# Patient Record
Sex: Male | Born: 1950
Health system: Southern US, Community
[De-identification: ages and names within clinical notes are randomized; demographics above are authoritative.]

## PROBLEM LIST (undated history)

## (undated) DIAGNOSIS — E039 Hypothyroidism, unspecified: Secondary | ICD-10-CM

## (undated) DIAGNOSIS — K469 Unspecified abdominal hernia without obstruction or gangrene: Secondary | ICD-10-CM

## (undated) DIAGNOSIS — M1990 Unspecified osteoarthritis, unspecified site: Secondary | ICD-10-CM

## (undated) DIAGNOSIS — N182 Chronic kidney disease, stage 2 (mild): Secondary | ICD-10-CM

## (undated) DIAGNOSIS — E785 Hyperlipidemia, unspecified: Secondary | ICD-10-CM

## (undated) DIAGNOSIS — D649 Anemia, unspecified: Secondary | ICD-10-CM

## (undated) DIAGNOSIS — F329 Major depressive disorder, single episode, unspecified: Secondary | ICD-10-CM

## (undated) DIAGNOSIS — K219 Gastro-esophageal reflux disease without esophagitis: Secondary | ICD-10-CM

## (undated) DIAGNOSIS — I10 Essential (primary) hypertension: Secondary | ICD-10-CM

## (undated) DIAGNOSIS — T7840XA Allergy, unspecified, initial encounter: Secondary | ICD-10-CM

## (undated) DIAGNOSIS — I714 Abdominal aortic aneurysm, without rupture, unspecified: Secondary | ICD-10-CM

## (undated) DIAGNOSIS — K5730 Diverticulosis of large intestine without perforation or abscess without bleeding: Secondary | ICD-10-CM

## (undated) DIAGNOSIS — F32A Depression, unspecified: Secondary | ICD-10-CM

## (undated) DIAGNOSIS — Z7901 Long term (current) use of anticoagulants: Secondary | ICD-10-CM

## (undated) DIAGNOSIS — I255 Ischemic cardiomyopathy: Secondary | ICD-10-CM

## (undated) DIAGNOSIS — I2510 Atherosclerotic heart disease of native coronary artery without angina pectoris: Secondary | ICD-10-CM

## (undated) DIAGNOSIS — I5022 Chronic systolic (congestive) heart failure: Secondary | ICD-10-CM

## (undated) DIAGNOSIS — I723 Aneurysm of iliac artery: Secondary | ICD-10-CM

## (undated) HISTORY — PX: CARDIAC CATHETERIZATION: SHX172

## (undated) HISTORY — DX: Chronic kidney disease, stage 2 (mild): N18.2

## (undated) HISTORY — DX: Essential (primary) hypertension: I10

## (undated) HISTORY — DX: Major depressive disorder, single episode, unspecified: F32.9

## (undated) HISTORY — DX: Depression, unspecified: F32.A

## (undated) HISTORY — DX: Unspecified abdominal hernia without obstruction or gangrene: K46.9

## (undated) HISTORY — DX: Allergy, unspecified, initial encounter: T78.40XA

## (undated) HISTORY — PX: JOINT REPLACEMENT: SHX530

## (undated) HISTORY — DX: Gastro-esophageal reflux disease without esophagitis: K21.9

## (undated) HISTORY — DX: Hyperlipidemia, unspecified: E78.5

## (undated) HISTORY — DX: Unspecified osteoarthritis, unspecified site: M19.90

## (undated) HISTORY — DX: Abdominal aortic aneurysm, without rupture, unspecified: I71.40

## (undated) HISTORY — DX: Atherosclerotic heart disease of native coronary artery without angina pectoris: I25.10

## (undated) HISTORY — DX: Anemia, unspecified: D64.9

## (undated) HISTORY — PX: HERNIA REPAIR: SHX51

## (undated) HISTORY — DX: Abdominal aortic aneurysm, without rupture: I71.4

---

## 1995-11-15 HISTORY — PX: EXPLORATORY LAPAROTOMY: SUR591

## 2006-11-14 HISTORY — PX: CORONARY ARTERY BYPASS GRAFT: SHX141

## 2007-06-18 ENCOUNTER — Ambulatory Visit: Payer: Self-pay | Admitting: Cardiovascular Disease

## 2007-06-18 HISTORY — PX: CARDIAC CATHETERIZATION: SHX172

## 2007-06-25 DIAGNOSIS — Z951 Presence of aortocoronary bypass graft: Secondary | ICD-10-CM

## 2007-06-25 HISTORY — DX: Presence of aortocoronary bypass graft: Z95.1

## 2007-06-25 HISTORY — PX: CORONARY ARTERY BYPASS GRAFT: SHX141

## 2007-08-06 ENCOUNTER — Ambulatory Visit: Payer: Self-pay | Admitting: Vascular Surgery

## 2007-08-15 HISTORY — PX: ABDOMINAL AORTIC ANEURYSM REPAIR: SUR1152

## 2007-08-23 ENCOUNTER — Ambulatory Visit: Payer: Self-pay | Admitting: Vascular Surgery

## 2007-08-29 ENCOUNTER — Inpatient Hospital Stay: Payer: Self-pay | Admitting: Vascular Surgery

## 2007-08-29 HISTORY — PX: ABDOMINAL AORTIC ANEURYSM REPAIR: SUR1152

## 2010-06-28 ENCOUNTER — Ambulatory Visit: Payer: Self-pay | Admitting: Internal Medicine

## 2010-06-28 HISTORY — PX: CARDIAC CATHETERIZATION: SHX172

## 2010-07-12 ENCOUNTER — Ambulatory Visit: Payer: Self-pay

## 2012-02-20 ENCOUNTER — Encounter: Payer: Self-pay | Admitting: Cardiovascular Disease

## 2012-02-20 ENCOUNTER — Ambulatory Visit (INDEPENDENT_AMBULATORY_CARE_PROVIDER_SITE_OTHER): Payer: No Typology Code available for payment source | Admitting: Cardiovascular Disease

## 2012-02-20 VITALS — BP 116/84 | Ht 73.0 in | Wt 221.0 lb

## 2012-02-20 DIAGNOSIS — E785 Hyperlipidemia, unspecified: Secondary | ICD-10-CM

## 2012-02-20 DIAGNOSIS — R0789 Other chest pain: Secondary | ICD-10-CM

## 2012-02-20 DIAGNOSIS — I714 Abdominal aortic aneurysm, without rupture, unspecified: Secondary | ICD-10-CM

## 2012-02-20 DIAGNOSIS — N183 Chronic kidney disease, stage 3 unspecified: Secondary | ICD-10-CM | POA: Insufficient documentation

## 2012-02-20 DIAGNOSIS — I1310 Hypertensive heart and chronic kidney disease without heart failure, with stage 1 through stage 4 chronic kidney disease, or unspecified chronic kidney disease: Secondary | ICD-10-CM | POA: Insufficient documentation

## 2012-02-20 DIAGNOSIS — I129 Hypertensive chronic kidney disease with stage 1 through stage 4 chronic kidney disease, or unspecified chronic kidney disease: Secondary | ICD-10-CM | POA: Insufficient documentation

## 2012-02-20 DIAGNOSIS — I10 Essential (primary) hypertension: Secondary | ICD-10-CM

## 2012-02-20 DIAGNOSIS — I2510 Atherosclerotic heart disease of native coronary artery without angina pectoris: Secondary | ICD-10-CM | POA: Insufficient documentation

## 2012-02-20 MED ORDER — ROSUVASTATIN CALCIUM 5 MG PO TABS: 5.0000 mg | ORAL_TABLET | Freq: Every day | ORAL | Status: DC

## 2012-02-20 NOTE — Patient Instructions (Addendum)
Change Crestor to 5 mg take one tablet daily. Need to have a Lexiscan Myoview at [facility] for March 09, 2012 arrive at 7:00 am.  Follow up in 6 months with Dr.Arida.

## 2012-02-20 NOTE — Assessment & Plan Note (Signed)
I recommend changing Crestor to 5 mg once daily instead of 10 mg once a week in order to provide a more consistent dose. He is going to have a physical done with Dr. Dossie Arbour in the next 2 months. I recommend a followup fasting lipid and liver profile.

## 2012-02-20 NOTE — Progress Notes (Signed)
HPI  Stuart Harvey is a 61 year old male who is here today to reestablish cardiovascular care. I saw him in 2008 for chest pain. Cardiac catheterization showed severe three-vessel coronary artery disease. It also showed a large infrarenal abdominal aortic aneurysm. He underwent coronary artery bypass graft surgery in August 2008 at [facility]. He underwent open surgical repair of aortic aneurysm in October of 2008. No cardiac events since then. He saw Dr. Gwen Pounds for the past 2 years. He had an abnormal stress test in 2011. Cardiac catheterization showed patent grafts with an ejection fraction of 40%. The patient has been doing reasonably well. However, he had few episodes of left-sided chest discomfort described as an aching sensation lasting for about 5 minutes. These happened at rest. He also did notice increased exertional dyspnea. He thinks that the chest pain might be muscular. He takes Crestor 10 mg once a week due to previous myalgia.  Not on File   Current Outpatient Prescriptions on File Prior to Visit  Medication Sig Dispense Refill  . albuterol-ipratropium (COMBIVENT) 18-103 MCG/ACT inhaler Inhale 2 puffs into the lungs every 6 (six) hours as needed.      Marland Kitchen buPROPion (WELLBUTRIN SR) 200 MG 12 hr tablet Take 200 mg by mouth 2 (two) times daily.      . citalopram (CELEXA) 20 MG tablet Take 20 mg by mouth daily.      Marland Kitchen lisinopril (PRINIVIL,ZESTRIL) 5 MG tablet Take 5 mg by mouth daily.      . metoprolol (LOPRESSOR) 50 MG tablet Take 50 mg by mouth daily.      Marland Kitchen omeprazole (PRILOSEC) 20 MG capsule Take 20 mg by mouth daily.         Past Medical History  Diagnosis Date  . Arthritis     both knees  . History of heart attack     "silent heart attack"  . Coronary artery disease     CABG at [facility] in 06/2007 for 3 vessel CAD, Cath in 2011 showed patent grafts. EF 40%  . Hypertension   . Hyperlipidemia   . AAA (abdominal aortic aneurysm)     s/p surgical repair in  08/2007 by Dr. Earnestine Leys     Past Surgical History  Procedure Date  . Exploratory laparotomy 1997  . Coronary artery bypass graft 2008  . Cardiac catheterization 2008, 2011    [facility]   . Abdominal aortic aneurysm repair 08/2007     Family History  Problem Relation Age of Onset  . Heart disease    . Heart attack       History   Social History  . Marital Status: Unknown    Spouse Name: N/A    Number of Children: N/A  . Years of Education: N/A   Occupational History  . Not on file.   Social History Main Topics  . Smoking status: Former Games developer  . Smokeless tobacco: Not on file   Comment: quit 2008  . Alcohol Use: No  . Drug Use: No  . Sexually Active: Not on file   Other Topics Concern  . Not on file   Social History Narrative  . No narrative on file     ROS Constitutional: Negative for fever, chills, diaphoresis, activity change, appetite change and fatigue.  HENT: Negative for hearing loss, nosebleeds, congestion, sore throat, facial swelling, drooling, trouble swallowing, neck pain, voice change, sinus pressure and tinnitus.  Eyes: Negative for photophobia, pain, discharge and visual disturbance.  Respiratory: Negative  for apnea, cough and wheezing.  Cardiovascular: Negative for  palpitations and leg swelling.  Gastrointestinal: Negative for nausea, vomiting, abdominal pain, diarrhea, constipation, blood in stool and abdominal distention.  Genitourinary: Negative for dysuria, urgency, frequency, hematuria and decreased urine volume.  Musculoskeletal: Negative for myalgias, back pain, joint swelling, arthralgias and gait problem.  Skin: Negative for color change, pallor, rash and wound.  Neurological: Negative for dizziness, tremors, seizures, syncope, speech difficulty, weakness, light-headedness, numbness and headaches.  Psychiatric/Behavioral: Negative for suicidal ideas, hallucinations, behavioral problems and agitation. The patient is not nervous/anxious.      PHYSICAL EXAM   BP 116/84  Ht 6\' 1"  (1.854 m)  Wt 221 lb (100.245 kg)  BMI 29.16 kg/m2  Constitutional: He is oriented to person, place, and time. He appears well-developed and well-nourished. No distress.  HENT: No nasal discharge.  Head: Normocephalic and atraumatic.  Eyes: Pupils are equal and round. Right eye exhibits no discharge. Left eye exhibits no discharge.  Neck: Normal range of motion. Neck supple. No JVD present. No thyromegaly present.  Cardiovascular: Normal rate, regular rhythm, normal heart sounds and. Exam reveals no gallop and no friction rub. No murmur heard.  Pulmonary/Chest: Surgical scar from CABG.  Effort normal and breath sounds normal. No stridor. No respiratory distress. He has no wheezes. He has no rales. He exhibits no tenderness.  Abdominal: Soft. Bowel sounds are normal. He exhibits no distension. There is no tenderness. There is no rebound and no guarding. There is a surgical scar from previous aneurysm repair.  Musculoskeletal: Normal range of motion. He exhibits no edema and no tenderness.  Neurological: He is alert and oriented to person, place, and time. Coordination normal.  Skin: Skin is warm and dry. No rash noted. He is not diaphoretic. No erythema. No pallor.  Psychiatric: He has a normal mood and affect. His behavior is normal. Judgment and thought content normal.       EKG: Sinus bradycardia with left anterior fascicular block. There is T-wave inversion in the precordial leads.   ASSESSMENT AND PLAN

## 2012-02-20 NOTE — Assessment & Plan Note (Signed)
His blood pressure is well controlled. Continue current medications. We'll have to clarify whether he is on Toprol or Lopressor.  I

## 2012-02-20 NOTE — Assessment & Plan Note (Signed)
The patient has known history of coronary artery disease status post CABG in 2008. Cardiac catheterization 2011 showed patent grafts. Currently he is having exertional dyspnea and an atypical chest pain. I recommend further evaluation with a pharmacologic nuclear stress test for further evaluation.  Continue medical therapy for CAD.

## 2012-02-20 NOTE — Assessment & Plan Note (Signed)
Would consider an abdominal ultrasound in the future if this is not being followed somewhere else.

## 2012-03-09 ENCOUNTER — Ambulatory Visit: Payer: Self-pay | Admitting: Cardiovascular Disease

## 2012-03-09 DIAGNOSIS — R079 Chest pain, unspecified: Secondary | ICD-10-CM

## 2012-03-12 ENCOUNTER — Other Ambulatory Visit: Payer: Self-pay | Admitting: Cardiovascular Disease

## 2012-03-12 DIAGNOSIS — R0789 Other chest pain: Secondary | ICD-10-CM

## 2012-03-14 ENCOUNTER — Encounter: Payer: Self-pay | Admitting: Cardiovascular Disease

## 2012-08-28 ENCOUNTER — Encounter: Payer: Self-pay | Admitting: Cardiovascular Disease

## 2012-08-28 ENCOUNTER — Ambulatory Visit (INDEPENDENT_AMBULATORY_CARE_PROVIDER_SITE_OTHER): Payer: No Typology Code available for payment source | Admitting: Cardiovascular Disease

## 2012-08-28 VITALS — BP 100/70 | HR 57 | Ht 73.0 in | Wt 210.5 lb

## 2012-08-28 DIAGNOSIS — I2510 Atherosclerotic heart disease of native coronary artery without angina pectoris: Secondary | ICD-10-CM

## 2012-08-28 DIAGNOSIS — F32A Depression, unspecified: Secondary | ICD-10-CM | POA: Insufficient documentation

## 2012-08-28 DIAGNOSIS — E785 Hyperlipidemia, unspecified: Secondary | ICD-10-CM

## 2012-08-28 DIAGNOSIS — F329 Major depressive disorder, single episode, unspecified: Secondary | ICD-10-CM | POA: Insufficient documentation

## 2012-08-28 DIAGNOSIS — I714 Abdominal aortic aneurysm, without rupture, unspecified: Secondary | ICD-10-CM

## 2012-08-28 DIAGNOSIS — I10 Essential (primary) hypertension: Secondary | ICD-10-CM

## 2012-08-28 DIAGNOSIS — F3289 Other specified depressive episodes: Secondary | ICD-10-CM

## 2012-08-28 MED ORDER — METOPROLOL TARTRATE 25 MG PO TABS: 25.0000 mg | ORAL_TABLET | Freq: Two times a day (BID) | ORAL | Status: DC

## 2012-08-28 NOTE — Assessment & Plan Note (Signed)
Overall, he seems to be doing reasonably well. He hasn't had any significant episodes of chest pain since his last visit. Most recent stress test was not significantly changed from before. Continue medical therapy.

## 2012-08-28 NOTE — Progress Notes (Signed)
HPI  [name] is a 61 year old male who is here today for a followup visit. He has known history of severe three-vessel coronary artery disease diagnosed in 2008 as well as a large infrarenal abdominal aortic aneurysm. He underwent coronary artery bypass graft surgery in August 2008 at [facility]. He underwent open surgical repair of aortic aneurysm in October of 2008. No cardiac events since then.  He had an abnormal stress test in 2011. Cardiac catheterization was done by Dr. Gwen Pounds which showed patent grafts with an ejection fraction of 40%. During his last visit, he had some atypical chest pain. He underwent a nuclear stress test which showed evidence of prior inferolateral infarct with some peri-infarct ischemia and normal ejection fraction. This was compared to his previous stress testing in 2011 and basically was unchanged. Since last visit, the patient has been doing reasonably well. However, he is having more problems with depression as well as an intentional weight loss of 11 pounds. He denies any chest pain or significant dyspnea.  No Known Allergies   Current Outpatient Prescriptions on File Prior to Visit  Medication Sig Dispense Refill  . aspirin 81 MG tablet Take 81 mg by mouth daily.      Marland Kitchen buPROPion (WELLBUTRIN SR) 200 MG 12 hr tablet Take 200 mg by mouth 2 (two) times daily.      . Cholecalciferol (D3 SUPER STRENGTH) 2000 UNITS CAPS Take by mouth daily.      . citalopram (CELEXA) 20 MG tablet Take 20 mg by mouth daily.      . Coenzyme Q10 (COQ-10) 200 MG CAPS Take by mouth daily.      Marland Kitchen levothyroxine (SYNTHROID, LEVOTHROID) 50 MCG tablet Take 50 mcg by mouth daily.      Marland Kitchen lisinopril (PRINIVIL,ZESTRIL) 5 MG tablet Take 5 mg by mouth daily.      Marland Kitchen omeprazole (PRILOSEC) 20 MG capsule Take 20 mg by mouth daily.         Past Medical History  Diagnosis Date  . Arthritis     both knees  . History of heart attack     "silent heart attack"  .  Hypertension   . Hyperlipidemia   . AAA (abdominal aortic aneurysm)     s/p surgical repair in 08/2007 by Dr. Earnestine Leys  . Coronary artery disease     CABG at [facility] in 06/2007 for 3 vessel CAD, LIMA to LAD, SVG to RPDA, SVG to OM2 and SVG to D2.  Cath in 2011 showed patent grafts. EF 40%     Past Surgical History  Procedure Date  . Exploratory laparotomy 1997  . Coronary artery bypass graft 2008  . Cardiac catheterization 2008, 2011    [facility]   . Abdominal aortic aneurysm repair 08/2007     Family History  Problem Relation Age of Onset  . Heart disease    . Heart attack       History   Social History  . Marital Status: Unknown    Spouse Name: N/A    Number of Children: N/A  . Years of Education: N/A   Occupational History  . Not on file.   Social History Main Topics  . Smoking status: Former Smoker -- 1.0 packs/day for 35 years    Types: Cigarettes    Quit date: 12/16/2005  . Smokeless tobacco: Not on file   Comment: quit 2008  . Alcohol Use: No  . Drug Use: No  . Sexually Active:  Not on file   Other Topics Concern  . Not on file   Social History Narrative  . No narrative on file        PHYSICAL EXAM   BP 100/70  Pulse 57  Ht 6\' 1"  (1.854 m)  Wt 210 lb 8 oz (95.482 kg)  BMI 27.77 kg/m2  Constitutional: He is oriented to person, place, and time. He appears well-developed and well-nourished. No distress.  HENT: No nasal discharge.  Head: Normocephalic and atraumatic.  Eyes: Pupils are equal and round. Right eye exhibits no discharge. Left eye exhibits no discharge.  Neck: Normal range of motion. Neck supple. No JVD present. No thyromegaly present.  Cardiovascular: Normal rate, regular rhythm, normal heart sounds and. Exam reveals no gallop and no friction rub. No murmur heard.  Pulmonary/Chest: Surgical scar from CABG.  Effort normal and breath sounds normal. No stridor. No respiratory distress. He has no wheezes. He has no rales. He exhibits no  tenderness.  Abdominal: Soft. Bowel sounds are normal. He exhibits no distension. There is no tenderness. There is no rebound and no guarding. There is a surgical scar from previous aneurysm repair.  Musculoskeletal: Normal range of motion. He exhibits no edema and no tenderness.  Neurological: He is alert and oriented to person, place, and time. Coordination normal.  Skin: Skin is warm and dry. No rash noted. He is not diaphoretic. No erythema. No pallor.  Psychiatric: He has a normal mood and affect. His behavior is normal. Judgment and thought content normal.       EKG: Sinus  Bradycardia  -Nonspecific QRS widening and anterior fascicular block.   -  Negative T-waves  -Possible  Anterolateral  ischemia.   ABNORMAL   ASSESSMENT AND PLAN

## 2012-08-28 NOTE — Assessment & Plan Note (Signed)
He is currently taking metoprolol tartrate 50 mg once daily. This should be a twice a day medication and thus I will go ahead and change it to 25 mg twice daily.

## 2012-08-28 NOTE — Patient Instructions (Addendum)
Change Metoprolol to 25 mg twice daily (you can take 1/2 tablet of 50 mg twice daily until you get new prescription) Continue other medications.  Follow up in 6 months.

## 2012-08-28 NOTE — Assessment & Plan Note (Signed)
He is tolerating current dose of Crestor 10 mg once daily.

## 2012-08-28 NOTE — Assessment & Plan Note (Signed)
The patient seems to be having increased symptoms of depression with weight loss. He has a followup appointment with Dr. Dossie Arbour about that.

## 2012-08-28 NOTE — Assessment & Plan Note (Signed)
Status post surgical repair in 2008. This is being followed by Dr. Wyn Quaker.

## 2012-11-02 ENCOUNTER — Ambulatory Visit: Payer: Self-pay | Admitting: Vascular Surgery

## 2012-11-14 HISTORY — PX: ABDOMINAL AORTIC ANEURYSM REPAIR: SUR1152

## 2012-11-15 ENCOUNTER — Telehealth: Payer: Self-pay | Admitting: Cardiovascular Disease

## 2012-11-15 NOTE — Telephone Encounter (Signed)
He is at low - moderate risk. Most recent stress test was done in April of 2013.

## 2012-11-15 NOTE — Telephone Encounter (Signed)
See below and advise thanks 

## 2012-11-15 NOTE — Telephone Encounter (Signed)
Pt needs AAA surgery and needs cardiac clearance. Please fax to [phone]. Dr Wyn Quaker will preform procedure

## 2012-11-15 NOTE — Telephone Encounter (Signed)
Letter faxed to # provided 

## 2012-11-26 ENCOUNTER — Ambulatory Visit: Payer: Self-pay | Admitting: Vascular Surgery

## 2012-11-26 LAB — CBC
HCT: 41.7 % (ref 40.0–52.0)
HGB: 14.1 g/dL (ref 13.0–18.0)
MCV: 94 fL (ref 80–100)
Platelet: 207 10*3/uL (ref 150–440)
RBC: 4.46 10*6/uL (ref 4.40–5.90)
RDW: 13.5 % (ref 11.5–14.5)

## 2012-11-26 LAB — BASIC METABOLIC PANEL
Anion Gap: 4 — ABNORMAL LOW (ref 7–16)
BUN: 30 mg/dL — ABNORMAL HIGH (ref 7–18)
Calcium, Total: 9.3 mg/dL (ref 8.5–10.1)
Chloride: 105 mmol/L (ref 98–107)
Co2: 27 mmol/L (ref 21–32)
Creatinine: 1.12 mg/dL (ref 0.60–1.30)
EGFR (Non-African Amer.): >60
Glucose: 94 mg/dL (ref 65–99)
Sodium: 136 mmol/L (ref 136–145)

## 2012-12-05 ENCOUNTER — Inpatient Hospital Stay: Payer: Self-pay | Admitting: Vascular Surgery

## 2012-12-05 HISTORY — PX: ABDOMINAL AORTIC ANEURYSM REPAIR: SUR1152

## 2012-12-06 LAB — COMPREHENSIVE METABOLIC PANEL
Albumin: 3 g/dL — ABNORMAL LOW (ref 3.4–5.0)
BUN: 19 mg/dL — ABNORMAL HIGH (ref 7–18)
Bilirubin,Total: 0.3 mg/dL (ref 0.2–1.0)
Calcium, Total: 8.4 mg/dL — ABNORMAL LOW (ref 8.5–10.1)
Chloride: 107 mmol/L (ref 98–107)
Co2: 27 mmol/L (ref 21–32)
Creatinine: 1.16 mg/dL (ref 0.60–1.30)
Osmolality: 280 (ref 275–301)
SGOT(AST): 25 U/L (ref 15–37)
Sodium: 139 mmol/L (ref 136–145)
Total Protein: 6.2 g/dL — ABNORMAL LOW (ref 6.4–8.2)

## 2012-12-06 LAB — PROTIME-INR: INR: 1

## 2012-12-06 LAB — CBC WITH DIFFERENTIAL/PLATELET
Basophil %: 0.2 %
Eosinophil %: 0.9 %
HCT: 36.9 % — ABNORMAL LOW (ref 40.0–52.0)
HGB: 12.4 g/dL — ABNORMAL LOW (ref 13.0–18.0)
Lymphocyte #: 2 10*3/uL (ref 1.0–3.6)
Lymphocyte %: 19.4 %
MCH: 31.2 pg (ref 26.0–34.0)
MCV: 93 fL (ref 80–100)
Monocyte #: 1 x10 3/mm (ref 0.2–1.0)
Neutrophil #: 7.4 10*3/uL — ABNORMAL HIGH (ref 1.4–6.5)
RBC: 3.98 10*6/uL — ABNORMAL LOW (ref 4.40–5.90)
RDW: 13.1 % (ref 11.5–14.5)

## 2012-12-06 LAB — PHOSPHORUS: Phosphorus: 3.3 mg/dL (ref 2.5–4.9)

## 2013-01-21 ENCOUNTER — Telehealth: Payer: Self-pay | Admitting: *Deleted

## 2013-01-21 NOTE — Telephone Encounter (Signed)
Error

## 2013-02-26 ENCOUNTER — Encounter: Payer: Self-pay | Admitting: Cardiovascular Disease

## 2013-02-26 ENCOUNTER — Ambulatory Visit (INDEPENDENT_AMBULATORY_CARE_PROVIDER_SITE_OTHER): Payer: No Typology Code available for payment source | Admitting: Cardiovascular Disease

## 2013-02-26 VITALS — BP 120/70 | HR 55 | Ht 72.0 in | Wt 218.5 lb

## 2013-02-26 DIAGNOSIS — R079 Chest pain, unspecified: Secondary | ICD-10-CM

## 2013-02-26 DIAGNOSIS — I10 Essential (primary) hypertension: Secondary | ICD-10-CM

## 2013-02-26 DIAGNOSIS — R0602 Shortness of breath: Secondary | ICD-10-CM

## 2013-02-26 DIAGNOSIS — I2510 Atherosclerotic heart disease of native coronary artery without angina pectoris: Secondary | ICD-10-CM

## 2013-02-26 DIAGNOSIS — E785 Hyperlipidemia, unspecified: Secondary | ICD-10-CM

## 2013-02-26 NOTE — Patient Instructions (Addendum)
Continue same medications  Follow up in 1 year

## 2013-02-26 NOTE — Progress Notes (Signed)
HPI  [name] is a 62 year old male who is here today for a followup visit. He has known history of severe three-vessel coronary artery disease diagnosed in 2008 as well as a large infrarenal abdominal aortic aneurysm. He underwent coronary artery bypass graft surgery in August 2008 at [facility]. He underwent open surgical repair of aortic aneurysm in October of 2008. No cardiac events since then.  He had an abnormal stress test in 2011. Cardiac catheterization showed patent grafts with an ejection fraction of 40%. Most recent stress test was in 2013 which showed evidence of prior inferolateral infarct with some peri-infarct ischemia and normal ejection fraction. This was compared to his previous stress testing in 2011 and basically was unchanged. In January of this year, he underwent endovascular repair of AAA endoleak by Dr. Wyn Quaker.  He has been stable from a cardiac standpoint. He gets occasional substernal chest discomfort if he overexerts himself with dyspnea. These symptoms have been unchanged over the last year. He is able to do his yard work without significant limitations.  No Known Allergies   Current Outpatient Prescriptions on File Prior to Visit  Medication Sig Dispense Refill  . aspirin 81 MG tablet Take 81 mg by mouth daily.      Marland Kitchen buPROPion (WELLBUTRIN SR) 200 MG 12 hr tablet Take 200 mg by mouth 2 (two) times daily.      . Cholecalciferol (D3 SUPER STRENGTH) 2000 UNITS CAPS Take by mouth daily.      . citalopram (CELEXA) 20 MG tablet Take 20 mg by mouth daily.      . Coenzyme Q10 (COQ-10) 200 MG CAPS Take by mouth daily.      Marland Kitchen levothyroxine (SYNTHROID, LEVOTHROID) 50 MCG tablet Take 50 mcg by mouth daily.      Marland Kitchen lisinopril (PRINIVIL,ZESTRIL) 5 MG tablet Take 5 mg by mouth daily.      . metoprolol tartrate (LOPRESSOR) 25 MG tablet Take 1 tablet (25 mg total) by mouth 2 (two) times daily.  180 tablet  3  . omeprazole (PRILOSEC) 20 MG capsule Take 20 mg by  mouth daily.      . rosuvastatin (CRESTOR) 10 MG tablet Take 10 mg by mouth daily.       No current facility-administered medications on file prior to visit.     Past Medical History  Diagnosis Date  . Arthritis     both knees  . History of heart attack     "silent heart attack"  . Hypertension   . Hyperlipidemia   . AAA (abdominal aortic aneurysm)     s/p surgical repair in 08/2007 by Dr. Earnestine Leys  . Coronary artery disease     CABG at [facility] in 06/2007 for 3 vessel CAD, LIMA to LAD, SVG to RPDA, SVG to OM2 and SVG to D2.  Cath in 2011 showed patent grafts. EF 40%     Past Surgical History  Procedure Laterality Date  . Exploratory laparotomy  1997  . Coronary artery bypass graft  2008  . Cardiac catheterization  2008, 2011    [facility]   . Abdominal aortic aneurysm repair  08/2007  . Abdominal aortic aneurysm repair  11/2012    [facility]     Family History  Problem Relation Age of Onset  . Heart disease    . Heart attack       History   Social History  . Marital Status: Unknown    Spouse Name: N/A  Number of Children: N/A  . Years of Education: N/A   Occupational History  . Not on file.   Social History Main Topics  . Smoking status: Former Smoker -- 1.00 packs/day for 35 years    Types: Cigarettes    Quit date: 12/16/2005  . Smokeless tobacco: Not on file     Comment: quit 2008  . Alcohol Use: No  . Drug Use: No  . Sexually Active: Not on file   Other Topics Concern  . Not on file   Social History Narrative  . No narrative on file        PHYSICAL EXAM   BP 120/70  Pulse 55  Ht 6' (1.829 m)  Wt 218 lb 8 oz (99.111 kg)  BMI 29.63 kg/m2  Constitutional: He is oriented to person, place, and time. He appears well-developed and well-nourished. No distress.  HENT: No nasal discharge.  Head: Normocephalic and atraumatic.  Eyes: Pupils are equal and round. Right eye exhibits no discharge. Left eye exhibits no discharge.  Neck: Normal range of motion.  Neck supple. No JVD present. No thyromegaly present.  Cardiovascular: Normal rate, regular rhythm, normal heart sounds and. Exam reveals no gallop and no friction rub. No murmur heard.  Pulmonary/Chest: Surgical scar from CABG.  Effort normal and breath sounds normal. No stridor. No respiratory distress. He has no wheezes. He has no rales. He exhibits no tenderness.  Abdominal: Soft. Bowel sounds are normal. He exhibits no distension. There is no tenderness. There is no rebound and no guarding. There is a surgical scar from previous aneurysm repair.  Musculoskeletal: Normal range of motion. He exhibits no edema and no tenderness.  Neurological: He is alert and oriented to person, place, and time. Coordination normal.  Skin: Skin is warm and dry. No rash noted. He is not diaphoretic. No erythema. No pallor.  Psychiatric: He has a normal mood and affect. His behavior is normal. Judgment and thought content normal.       EKG: Sinus  Bradycardia  -Nonspecific QRS widening.   -  Nonspecific T-abnormality.   ABNORMAL   ASSESSMENT AND PLAN

## 2013-02-26 NOTE — Assessment & Plan Note (Signed)
Continue lifelong treatment with a statin.

## 2013-02-26 NOTE — Assessment & Plan Note (Signed)
He is stable from a cardiac standpoint with no change in cardiac symptoms. Continue medical therapy. He is mildly bradycardic but overall seems to be asymptomatic from this. Continue current dose of metoprolol.

## 2013-02-26 NOTE — Assessment & Plan Note (Signed)
His blood pressure is well controlled on current medications. 

## 2013-05-31 ENCOUNTER — Telehealth: Payer: Self-pay

## 2013-05-31 NOTE — Telephone Encounter (Signed)
He is at low risk for colonoscopy. Aspirin can be stopped 5 days prior to procedure.

## 2013-05-31 NOTE — Telephone Encounter (Signed)
Needs a cardiac clearance for colonoscopy with Dr. Marva Panda. Also needs to come off of aspirin 5 days prior. Please advise.

## 2013-05-31 NOTE — Telephone Encounter (Signed)
Cardiac clearance letter faxed to Dr. Marva Panda.

## 2013-07-02 ENCOUNTER — Ambulatory Visit: Payer: Self-pay | Admitting: Gastroenterology

## 2013-07-04 LAB — PATHOLOGY REPORT

## 2013-09-09 ENCOUNTER — Other Ambulatory Visit: Payer: Self-pay | Admitting: *Deleted

## 2013-09-09 MED ORDER — METOPROLOL TARTRATE 25 MG PO TABS: 25.0000 mg | ORAL_TABLET | Freq: Two times a day (BID) | ORAL | Status: DC

## 2013-09-09 NOTE — Telephone Encounter (Signed)
Requested Prescriptions   Signed Prescriptions Disp Refills  . metoprolol tartrate (LOPRESSOR) 25 MG tablet 180 tablet 3    Sig: Take 1 tablet (25 mg total) by mouth 2 (two) times daily.    Authorizing Provider: ARIDA, MUHAMMAD A    Ordering User: LOPEZ, MARINA C    

## 2014-02-14 ENCOUNTER — Encounter: Payer: Self-pay | Admitting: *Deleted

## 2014-02-28 ENCOUNTER — Ambulatory Visit (INDEPENDENT_AMBULATORY_CARE_PROVIDER_SITE_OTHER): Payer: No Typology Code available for payment source | Admitting: Cardiovascular Disease

## 2014-02-28 ENCOUNTER — Encounter: Payer: Self-pay | Admitting: Cardiovascular Disease

## 2014-02-28 VITALS — BP 120/76 | HR 51 | Ht 73.0 in | Wt 230.2 lb

## 2014-02-28 DIAGNOSIS — I2510 Atherosclerotic heart disease of native coronary artery without angina pectoris: Secondary | ICD-10-CM

## 2014-02-28 DIAGNOSIS — I714 Abdominal aortic aneurysm, without rupture, unspecified: Secondary | ICD-10-CM

## 2014-02-28 DIAGNOSIS — E785 Hyperlipidemia, unspecified: Secondary | ICD-10-CM

## 2014-02-28 DIAGNOSIS — R0602 Shortness of breath: Secondary | ICD-10-CM

## 2014-02-28 DIAGNOSIS — I10 Essential (primary) hypertension: Secondary | ICD-10-CM

## 2014-02-28 NOTE — Assessment & Plan Note (Signed)
He is doing very well with no symptoms suggestive of angina. Continue medical therapy. 

## 2014-02-28 NOTE — Assessment & Plan Note (Signed)
This is being followed by Dr. Lucky Cowboy

## 2014-02-28 NOTE — Assessment & Plan Note (Signed)
Most recent lipid profile in the summer of 2014 showed a total cholesterol of 162, HDL 43, triglycerides 96 and an LDL of 100. Ideally, we should target an LDL of less than 70. However, he experiences myalgia with statins. Thus, Continue current dose of Crestor for now.

## 2014-02-28 NOTE — Progress Notes (Signed)
Primary care physician: Kathrine Haddock at Dr. Rance Muir office  HPI  Mr. Wiswell is a 63 year old male who is here today for a followup visit. He has known history of severe three-vessel coronary artery disease diagnosed in 2008 as well as a large infrarenal abdominal aortic aneurysm. He underwent coronary artery bypass graft surgery in August 2008 at [facility]. He underwent open surgical repair of aortic aneurysm in October of 2008. No cardiac events since then.  He had an abnormal stress test in 2011. Cardiac catheterization showed patent grafts with an ejection fraction of 40%. Most recent stress test was in 2013 which showed evidence of prior inferolateral infarct with some peri-infarct ischemia and normal ejection fraction. This was compared to his previous stress testing in 2011 and basically was unchanged. Aortic aneurysm followup is done  by Dr. Lucky Cowboy.  He has been stable from a cardiac standpoint. He gets occasional substernal chest discomfort if he overexerts himself with dyspnea. These symptoms have been unchanged over the last year. He is able to do his yard work without significant limitations. He has history of myalgia with statins and did not tolerate atorvastatin in the past. He has been able to tolerate 10 mg of Crestor and only reports mild myalgia.  No Known Allergies   Current Outpatient Prescriptions on File Prior to Visit  Medication Sig Dispense Refill  . aspirin 81 MG tablet Take 81 mg by mouth daily.      Marland Kitchen buPROPion (WELLBUTRIN SR) 200 MG 12 hr tablet Take 200 mg by mouth 2 (two) times daily.      . Cholecalciferol (D3 SUPER STRENGTH) 2000 UNITS CAPS Take by mouth daily.      . citalopram (CELEXA) 20 MG tablet Take 20 mg by mouth daily.      . Coenzyme Q10 (COQ-10) 200 MG CAPS Take by mouth daily.      Marland Kitchen levothyroxine (SYNTHROID, LEVOTHROID) 50 MCG tablet Take 50 mcg by mouth daily.      Marland Kitchen lisinopril (PRINIVIL,ZESTRIL) 5 MG tablet Take 5 mg by mouth  daily.      . metoprolol tartrate (LOPRESSOR) 25 MG tablet Take 1 tablet (25 mg total) by mouth 2 (two) times daily.  180 tablet  3  . omeprazole (PRILOSEC) 20 MG capsule Take 20 mg by mouth daily.      . rosuvastatin (CRESTOR) 10 MG tablet Take 10 mg by mouth daily.       No current facility-administered medications on file prior to visit.     Past Medical History  Diagnosis Date  . Arthritis     both knees  . History of heart attack     "silent heart attack"  . Hypertension   . Hyperlipidemia   . AAA (abdominal aortic aneurysm)     s/p surgical repair in 08/2007 by Dr. Hulda Humphrey  . Coronary artery disease     CABG at [facility] in 06/2007 for 3 vessel CAD, LIMA to LAD, SVG to RPDA, SVG to OM2 and SVG to D2.  Cath in 2011 showed patent grafts. EF 40%  . Depression      Past Surgical History  Procedure Laterality Date  . Exploratory laparotomy  1997  . Coronary artery bypass graft  2008  . Cardiac catheterization  2008, 2011    [facility]   . Abdominal aortic aneurysm repair  08/2007  . Abdominal aortic aneurysm repair  11/2012    [facility]     Family History  Problem Relation Age of  Onset  . Heart disease    . Heart attack       History   Social History  . Marital Status: Unknown    Spouse Name: N/A    Number of Children: N/A  . Years of Education: N/A   Occupational History  . Not on file.   Social History Main Topics  . Smoking status: Former Smoker -- 1.00 packs/day for 35 years    Types: Cigarettes    Quit date: 12/16/2005  . Smokeless tobacco: Current User    Types: Chew     Comment: quit 2008  . Alcohol Use: No  . Drug Use: No  . Sexual Activity: Not on file   Other Topics Concern  . Not on file   Social History Narrative  . No narrative on file        PHYSICAL EXAM   BP 120/76  Pulse 51  Ht 6\' 1"  (1.854 m)  Wt 230 lb 4 oz (104.441 kg)  BMI 30.38 kg/m2  Constitutional: He is oriented to person, place, and time. He appears well-developed and  well-nourished. No distress.  HENT: No nasal discharge.  Head: Normocephalic and atraumatic.  Eyes: Pupils are equal and round. Right eye exhibits no discharge. Left eye exhibits no discharge.  Neck: Normal range of motion. Neck supple. No JVD present. No thyromegaly present.  Cardiovascular: Normal rate, regular rhythm, normal heart sounds and. Exam reveals no gallop and no friction rub. No murmur heard.  Pulmonary/Chest: Surgical scar from CABG.  Effort normal and breath sounds normal. No stridor. No respiratory distress. He has no wheezes. He has no rales. He exhibits no tenderness.  Abdominal: Soft. Bowel sounds are normal. He exhibits no distension. There is no tenderness. There is no rebound and no guarding. There is a surgical scar from previous aneurysm repair.  Musculoskeletal: Normal range of motion. He exhibits no edema and no tenderness.  Neurological: He is alert and oriented to person, place, and time. Coordination normal.  Skin: Skin is warm and dry. No rash noted. He is not diaphoretic. No erythema. No pallor.  Psychiatric: He has a normal mood and affect. His behavior is normal. Judgment and thought content normal.       EKG: Sinus  Bradycardia  -Nonspecific QRS widening.   -  Nonspecific T-abnormality.   ABNORMAL   ASSESSMENT AND PLAN

## 2014-02-28 NOTE — Patient Instructions (Signed)
Continue same medications.   Your physician wants you to follow-up in: 12 months.  You will receive a reminder letter in the mail two months in advance. If you don't receive a letter, please call our office to schedule the follow-up appointment.

## 2014-02-28 NOTE — Assessment & Plan Note (Signed)
Blood pressure is under excellent control. He is mildly bradycardic but overall is asymptomatic. Continue same dose of metoprolol and lisinopril.

## 2014-05-05 DIAGNOSIS — M1710 Unilateral primary osteoarthritis, unspecified knee: Secondary | ICD-10-CM | POA: Insufficient documentation

## 2014-05-05 DIAGNOSIS — M179 Osteoarthritis of knee, unspecified: Secondary | ICD-10-CM | POA: Insufficient documentation

## 2014-07-30 ENCOUNTER — Ambulatory Visit: Payer: Self-pay | Admitting: General Practice

## 2014-07-30 LAB — BASIC METABOLIC PANEL
ANION GAP: 11 (ref 7–16)
BUN: 28 mg/dL — AB (ref 7–18)
CALCIUM: 8.8 mg/dL (ref 8.5–10.1)
CHLORIDE: 108 mmol/L — AB (ref 98–107)
Co2: 22 mmol/L (ref 21–32)
Creatinine: 1.3 mg/dL (ref 0.60–1.30)
EGFR (African American): >60
EGFR (Non-African Amer.): 58 — ABNORMAL LOW
GLUCOSE: 101 mg/dL — AB (ref 65–99)
Osmolality: 287 (ref 275–301)
POTASSIUM: 4.1 mmol/L (ref 3.5–5.1)
SODIUM: 141 mmol/L (ref 136–145)

## 2014-07-30 LAB — URINALYSIS, COMPLETE
Bacteria: NONE SEEN
Bilirubin,UR: NEGATIVE
Blood: NEGATIVE
Glucose,UR: NEGATIVE mg/dL (ref 0–75)
Ketone: NEGATIVE
Leukocyte Esterase: NEGATIVE
Nitrite: NEGATIVE
Ph: 6 (ref 4.5–8.0)
Protein: NEGATIVE
RBC,UR: <1 /HPF (ref 0–5)
Specific Gravity: 1.015 (ref 1.003–1.030)
Squamous Epithelial: <1

## 2014-07-30 LAB — SEDIMENTATION RATE: Erythrocyte Sed Rate: 4 mm/hr (ref 0–20)

## 2014-07-30 LAB — CBC
HCT: 43.1 % (ref 40.0–52.0)
HGB: 13.9 g/dL (ref 13.0–18.0)
MCH: 30.1 pg (ref 26.0–34.0)
MCHC: 32.3 g/dL (ref 32.0–36.0)
MCV: 93 fL (ref 80–100)
PLATELETS: 189 10*3/uL (ref 150–440)
RBC: 4.61 10*6/uL (ref 4.40–5.90)
RDW: 14 % (ref 11.5–14.5)
WBC: 6.7 10*3/uL (ref 3.8–10.6)

## 2014-07-30 LAB — PROTIME-INR
INR: 1
PROTHROMBIN TIME: 13 s (ref 11.5–14.7)

## 2014-07-30 LAB — APTT: Activated PTT: 29.9 secs (ref 23.6–35.9)

## 2014-07-30 LAB — MRSA PCR SCREENING

## 2014-07-31 LAB — URINE CULTURE

## 2014-08-13 ENCOUNTER — Inpatient Hospital Stay: Payer: Self-pay | Admitting: General Practice

## 2014-08-14 LAB — BASIC METABOLIC PANEL
Anion Gap: 6 — ABNORMAL LOW (ref 7–16)
BUN: 18 mg/dL (ref 7–18)
CHLORIDE: 108 mmol/L — AB (ref 98–107)
Calcium, Total: 8 mg/dL — ABNORMAL LOW (ref 8.5–10.1)
Co2: 25 mmol/L (ref 21–32)
Creatinine: 1.38 mg/dL — ABNORMAL HIGH (ref 0.60–1.30)
EGFR (Non-African Amer.): 55 — ABNORMAL LOW
Glucose: 128 mg/dL — ABNORMAL HIGH (ref 65–99)
OSMOLALITY: 281 (ref 275–301)
Potassium: 4.4 mmol/L (ref 3.5–5.1)
Sodium: 139 mmol/L (ref 136–145)

## 2014-08-14 LAB — PLATELET COUNT: PLATELETS: 144 10*3/uL — AB (ref 150–440)

## 2014-08-14 LAB — HEMOGLOBIN: HGB: 11.6 g/dL — ABNORMAL LOW (ref 13.0–18.0)

## 2014-08-15 LAB — BASIC METABOLIC PANEL
Anion Gap: 6 — ABNORMAL LOW (ref 7–16)
BUN: 15 mg/dL (ref 7–18)
CALCIUM: 8.1 mg/dL — AB (ref 8.5–10.1)
CHLORIDE: 108 mmol/L — AB (ref 98–107)
Co2: 25 mmol/L (ref 21–32)
Creatinine: 1.36 mg/dL — ABNORMAL HIGH (ref 0.60–1.30)
EGFR (African American): >60
EGFR (Non-African Amer.): 56 — ABNORMAL LOW
GLUCOSE: 101 mg/dL — AB (ref 65–99)
OSMOLALITY: 279 (ref 275–301)
Potassium: 4.1 mmol/L (ref 3.5–5.1)
SODIUM: 139 mmol/L (ref 136–145)

## 2014-08-15 LAB — PLATELET COUNT: Platelet: 146 10*3/uL — ABNORMAL LOW (ref 150–440)

## 2014-08-15 LAB — HEMOGLOBIN: HGB: 10.7 g/dL — AB (ref 13.0–18.0)

## 2014-08-28 DIAGNOSIS — Z96659 Presence of unspecified artificial knee joint: Secondary | ICD-10-CM | POA: Insufficient documentation

## 2014-09-25 ENCOUNTER — Other Ambulatory Visit: Payer: Self-pay

## 2014-09-25 MED ORDER — METOPROLOL TARTRATE 25 MG PO TABS: 25.0000 mg | ORAL_TABLET | Freq: Two times a day (BID) | ORAL | Status: DC

## 2014-09-25 NOTE — Telephone Encounter (Signed)
Refill sent for metoprolol tart 25 mg  

## 2014-10-02 IMAGING — CT CT ANGIO ABD-PELV WO/W CM
1 series · 1 of 3 positions shown · non-contrast
Comparison: none

REASON FOR EXAM: LABS 1ST  [facility]
COMMENTS:

PROCEDURE:     KCT - KCT ANGIOGRAPHY ABD/PEL W/WO  - November 02, 2012  [DATE]
RESULT:
TECHNIQUE: Axial 3 mm source imaging was obtained as well as coronal maximum
intensity projections of the abdominal aorta. This was status post
intravenous administration of 100 mL of Psovue-HCO.

[Series 8: mips · 1 of 3 slices shown]
[im 2/3]
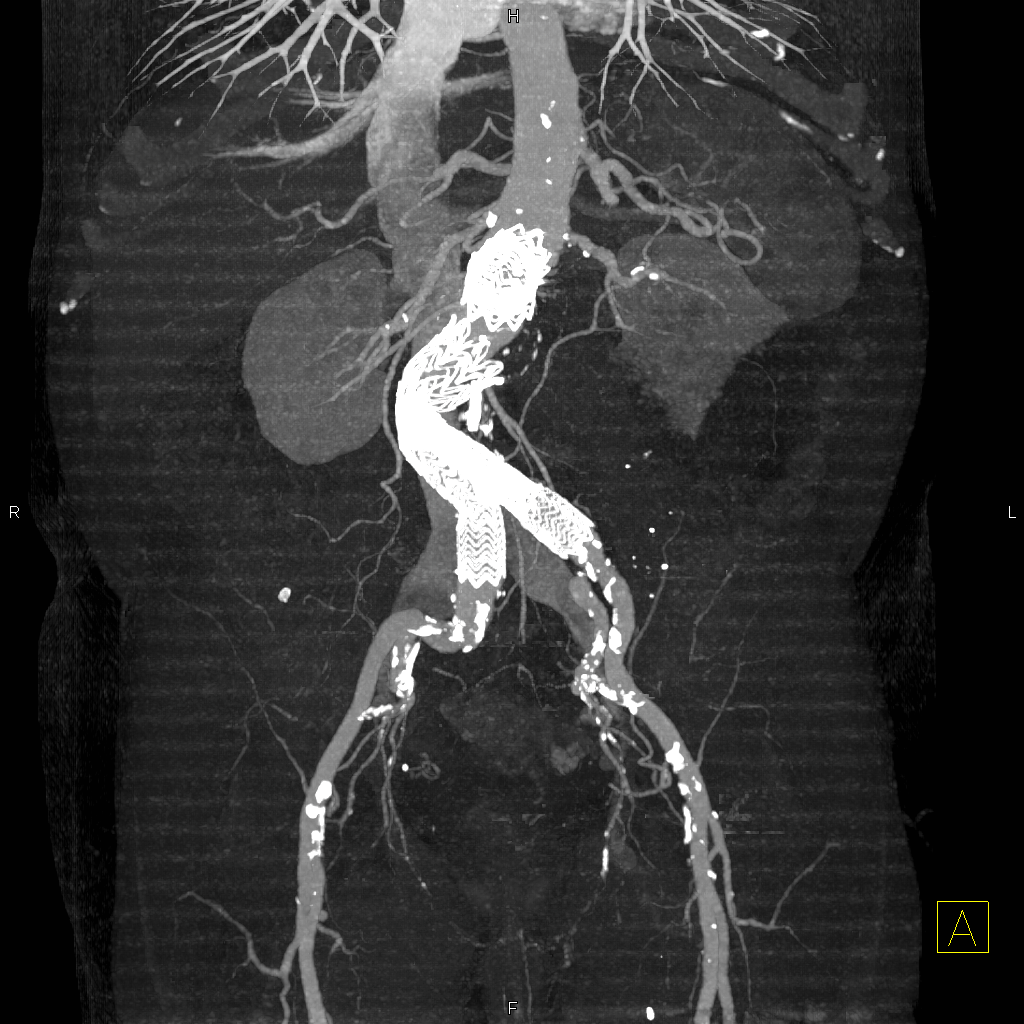

[1 of 3 positions shown; findings below may reference images not displayed]

FINDINGS: The patient is status post infrarenal abdominal aortic stent graft
repair. There is no evidence of periaortic free fluid nor loculated fluid
collections. There is no evidence of active contrast extravasation to
suggest active leakage. There is no evidence to suggest sequela of an
endoleak within the lumen of the aorta. Mural thrombus is appreciated
surrounding the aortic stent graft.

Contrast evaluation of the liver, spleen, adrenals, pancreas, and right
kidney is unremarkable. Multiple innumerable cysts are identified involving
the left kidney. There is no evidence of abdominal or pelvic free fluid,
loculated fluid collections, masses nor adenopathy. Diverticulosis is
appreciated within the sigmoid colon. An infrarenal abdominal aortic
aneurysm is once again appreciated measuring 5.42 x 5.09 cm in maximal AP by
transverse dimensions.
IMPRESSION: 1. No CT evidence of findings to suggest leakage of the patient's aortic
stent graft.

## 2014-12-13 ENCOUNTER — Inpatient Hospital Stay: Payer: Self-pay | Admitting: Internal Medicine

## 2014-12-13 LAB — URINALYSIS, COMPLETE
BACTERIA: NONE SEEN
Bilirubin,UR: NEGATIVE
Blood: NEGATIVE
GLUCOSE, UR: NEGATIVE mg/dL (ref 0–75)
KETONE: NEGATIVE
Leukocyte Esterase: NEGATIVE
Nitrite: NEGATIVE
PROTEIN: NEGATIVE
Ph: 5 (ref 4.5–8.0)
RBC,UR: 1 /HPF (ref 0–5)
SPECIFIC GRAVITY: 1.018 (ref 1.003–1.030)
Squamous Epithelial: 1

## 2014-12-13 LAB — CBC
HCT: 42.2 % (ref 40.0–52.0)
HGB: 13.6 g/dL (ref 13.0–18.0)
MCH: 26 pg (ref 26.0–34.0)
MCHC: 32.1 g/dL (ref 32.0–36.0)
MCV: 81 fL (ref 80–100)
Platelet: 215 10*3/uL (ref 150–440)
RBC: 5.21 10*6/uL (ref 4.40–5.90)
RDW: 15.9 % — ABNORMAL HIGH (ref 11.5–14.5)
WBC: 8.1 10*3/uL (ref 3.8–10.6)

## 2014-12-13 LAB — COMPREHENSIVE METABOLIC PANEL
ALBUMIN: 3.9 g/dL (ref 3.4–5.0)
ANION GAP: 7 (ref 7–16)
AST: 30 U/L (ref 15–37)
Alkaline Phosphatase: 123 U/L — ABNORMAL HIGH (ref 46–116)
BILIRUBIN TOTAL: 0.8 mg/dL (ref 0.2–1.0)
BUN: 27 mg/dL — AB (ref 7–18)
CHLORIDE: 105 mmol/L (ref 98–107)
Calcium, Total: 9.2 mg/dL (ref 8.5–10.1)
Co2: 26 mmol/L (ref 21–32)
Creatinine: 1.56 mg/dL — ABNORMAL HIGH (ref 0.60–1.30)
EGFR (Non-African Amer.): 48 — ABNORMAL LOW
GFR CALC AF AMER: 58 — AB
Glucose: 124 mg/dL — ABNORMAL HIGH (ref 65–99)
Osmolality: 282 (ref 275–301)
POTASSIUM: 4.1 mmol/L (ref 3.5–5.1)
SGPT (ALT): 20 U/L (ref 14–63)
SODIUM: 138 mmol/L (ref 136–145)
Total Protein: 7.9 g/dL (ref 6.4–8.2)

## 2014-12-13 LAB — LIPASE, BLOOD: Lipase: 139 U/L (ref 73–393)

## 2014-12-13 LAB — TROPONIN I

## 2014-12-14 LAB — CBC WITH DIFFERENTIAL/PLATELET
Basophil #: 0 10*3/uL (ref 0.0–0.1)
Basophil %: 0.4 %
Eosinophil #: 0.4 10*3/uL (ref 0.0–0.7)
Eosinophil %: 4.5 %
HCT: 40.3 % (ref 40.0–52.0)
HGB: 12.6 g/dL — ABNORMAL LOW (ref 13.0–18.0)
Lymphocyte #: 3.1 10*3/uL (ref 1.0–3.6)
Lymphocyte %: 35.7 %
MCH: 25.8 pg — ABNORMAL LOW (ref 26.0–34.0)
MCHC: 31.2 g/dL — ABNORMAL LOW (ref 32.0–36.0)
MCV: 83 fL (ref 80–100)
Monocyte #: 0.8 x10 3/mm (ref 0.2–1.0)
Monocyte %: 8.7 %
Neutrophil #: 4.4 10*3/uL (ref 1.4–6.5)
Neutrophil %: 50.7 %
Platelet: 215 10*3/uL (ref 150–440)
RBC: 4.89 10*6/uL (ref 4.40–5.90)
RDW: 16.1 % — ABNORMAL HIGH (ref 11.5–14.5)
WBC: 8.6 10*3/uL (ref 3.8–10.6)

## 2014-12-14 LAB — BASIC METABOLIC PANEL
Anion Gap: 6 — ABNORMAL LOW (ref 7–16)
BUN: 19 mg/dL — AB (ref 7–18)
CREATININE: 1.45 mg/dL — AB (ref 0.60–1.30)
Calcium, Total: 8.8 mg/dL (ref 8.5–10.1)
Chloride: 107 mmol/L (ref 98–107)
Co2: 26 mmol/L (ref 21–32)
GFR CALC NON AF AMER: 52 — AB
GLUCOSE: 95 mg/dL (ref 65–99)
Osmolality: 280 (ref 275–301)
Potassium: 3.8 mmol/L (ref 3.5–5.1)
SODIUM: 139 mmol/L (ref 136–145)

## 2014-12-14 LAB — WBCS, STOOL

## 2014-12-15 DIAGNOSIS — I2510 Atherosclerotic heart disease of native coronary artery without angina pectoris: Secondary | ICD-10-CM

## 2014-12-15 DIAGNOSIS — Z01810 Encounter for preprocedural cardiovascular examination: Secondary | ICD-10-CM

## 2014-12-16 LAB — BASIC METABOLIC PANEL
ANION GAP: 6 — AB (ref 7–16)
BUN: 10 mg/dL (ref 7–18)
CREATININE: 1.28 mg/dL (ref 0.60–1.30)
Calcium, Total: 8.5 mg/dL (ref 8.5–10.1)
Chloride: 109 mmol/L — ABNORMAL HIGH (ref 98–107)
Co2: 24 mmol/L (ref 21–32)
EGFR (Non-African Amer.): >60
GLUCOSE: 90 mg/dL (ref 65–99)
Osmolality: 276 (ref 275–301)
POTASSIUM: 4 mmol/L (ref 3.5–5.1)
SODIUM: 139 mmol/L (ref 136–145)

## 2014-12-16 LAB — STOOL CULTURE

## 2015-01-20 ENCOUNTER — Telehealth: Payer: Self-pay | Admitting: *Deleted

## 2015-01-20 NOTE — Telephone Encounter (Signed)
Request for surgical clearance:  What type of surgery is being performed? Verteral hernia repair and umbilical hernia repair  1. When is this surgery scheduled? 02/20/15  2. Are there any medications that need to be held prior to surgery and how long? Stoping asprain 7 days before  3. Name of physician performing surgery? Dr.William smith  4. What is your office phone and fax number? Fax number [phone],  Please put to attention to vikki  5.

## 2015-01-20 NOTE — Telephone Encounter (Signed)
He is at low risk from a cardiac standpoint. Aspirin can be stopped 7 days before surgery and resumed after.

## 2015-01-21 ENCOUNTER — Encounter: Payer: Self-pay | Admitting: *Deleted

## 2015-01-21 NOTE — Telephone Encounter (Signed)
Informed Jocelyn Lamer of Dr Jacklynn Ganong response  She verbalized understanding  Letter faxed as requested

## 2015-02-03 ENCOUNTER — Ambulatory Visit (INDEPENDENT_AMBULATORY_CARE_PROVIDER_SITE_OTHER): Payer: No Typology Code available for payment source | Admitting: Cardiovascular Disease

## 2015-02-03 ENCOUNTER — Encounter: Payer: Self-pay | Admitting: Cardiovascular Disease

## 2015-02-03 VITALS — BP 100/72 | HR 48 | Ht 72.0 in | Wt 225.5 lb

## 2015-02-03 DIAGNOSIS — Z01818 Encounter for other preprocedural examination: Secondary | ICD-10-CM | POA: Diagnosis not present

## 2015-02-03 DIAGNOSIS — Z01810 Encounter for preprocedural cardiovascular examination: Secondary | ICD-10-CM

## 2015-02-03 DIAGNOSIS — I714 Abdominal aortic aneurysm, without rupture, unspecified: Secondary | ICD-10-CM

## 2015-02-03 DIAGNOSIS — I2510 Atherosclerotic heart disease of native coronary artery without angina pectoris: Secondary | ICD-10-CM

## 2015-02-03 DIAGNOSIS — I10 Essential (primary) hypertension: Secondary | ICD-10-CM

## 2015-02-03 MED ORDER — METOPROLOL TARTRATE 25 MG PO TABS: 12.5000 mg | ORAL_TABLET | Freq: Two times a day (BID) | ORAL | Status: DC

## 2015-02-03 NOTE — Patient Instructions (Signed)
Decrease Metoprolol to 12.5 mg (1/2 tablet of 25 mg ) twice daily.   Your physician wants you to follow-up in: 6 months.  You will receive a reminder letter in the mail two months in advance. If you don't receive a letter, please call our office to schedule the follow-up appointment.

## 2015-02-03 NOTE — Progress Notes (Signed)
Primary care physician: Kathrine Haddock at Dr. Rance Muir office  HPI  Stuart Harvey is a 64 year old male who is here today for a followup visit. He has known history of severe three-vessel coronary artery disease diagnosed in 2008 as well as a large infrarenal abdominal aortic aneurysm. He underwent coronary artery bypass graft surgery in August 2008 at [facility]. He underwent open surgical repair of aortic aneurysm in October of 2008. No cardiac events since then.  He had an abnormal stress test in 2011. Cardiac catheterization showed patent grafts with an ejection fraction of 40%. Most recent stress test was in 2013 which showed evidence of prior inferolateral infarct with some peri-infarct ischemia and normal ejection fraction. This was compared to his previous stress testing in 2011 and basically was unchanged. Aortic aneurysm followup is done  by Dr. Lucky Cowboy.  He has been stable from a cardiac standpoint. He gets occasional substernal chest discomfort if he overexerts himself with dyspnea. These symptoms have been unchanged over the last year. He is able to do his yard work without significant limitations. He has history of myalgia with statins and did not tolerate atorvastatin in the past. He has been able to tolerate  Crestor . He was hospitalized at [facility] for suspected bowel obstruction but workup was negative and he did not require surgery. He is now referred for preoperative cardiovascular evaluation before hernia repair. His symptoms are unchanged from last year. His functional capacity is good.   Allergies  Allergen Reactions  . Codeine Nausea Only  . Tetracycline Rash     Current Outpatient Prescriptions on File Prior to Visit  Medication Sig Dispense Refill  . aspirin 81 MG tablet Take 81 mg by mouth daily.    Marland Kitchen buPROPion (WELLBUTRIN SR) 200 MG 12 hr tablet Take 200 mg by mouth 2 (two) times daily.    . Cholecalciferol (D3 SUPER STRENGTH) 2000 UNITS CAPS Take by mouth  daily.    . citalopram (CELEXA) 20 MG tablet Take 20 mg by mouth daily.    Marland Kitchen levothyroxine (SYNTHROID, LEVOTHROID) 50 MCG tablet Take 50 mcg by mouth daily.    Marland Kitchen lisinopril (PRINIVIL,ZESTRIL) 5 MG tablet Take 5 mg by mouth daily.    . metoprolol tartrate (LOPRESSOR) 25 MG tablet Take 1 tablet (25 mg total) by mouth 2 (two) times daily. 180 tablet 3  . omeprazole (PRILOSEC) 20 MG capsule Take 20 mg by mouth daily.     No current facility-administered medications on file prior to visit.     Past Medical History  Diagnosis Date  . Arthritis     both knees  . History of heart attack     "silent heart attack"  . Hypertension   . Hyperlipidemia   . AAA (abdominal aortic aneurysm)     s/p surgical repair in 08/2007 by Dr. Hulda Humphrey  . Coronary artery disease     CABG at [facility] in 06/2007 for 3 vessel CAD, LIMA to LAD, SVG to RPDA, SVG to OM2 and SVG to D2.  Cath in 2011 showed patent grafts. EF 40%  . Depression   . Hernia of abdominal cavity      Past Surgical History  Procedure Laterality Date  . Exploratory laparotomy  1997  . Coronary artery bypass graft  2008  . Cardiac catheterization  2008, 2011    [facility]   . Abdominal aortic aneurysm repair  08/2007  . Abdominal aortic aneurysm repair  11/2012    [facility]  . Total knee  arthroplasty Right      Family History  Problem Relation Age of Onset  . Heart disease    . Heart attack       History   Social History  . Marital Status: Married    Spouse Name: N/A  . Number of Children: N/A  . Years of Education: N/A   Occupational History  . Not on file.   Social History Main Topics  . Smoking status: Former Smoker -- 1.00 packs/day for 35 years    Types: Cigarettes    Quit date: 12/16/2005  . Smokeless tobacco: Current User    Types: Chew     Comment: quit 2008  . Alcohol Use: No  . Drug Use: No  . Sexual Activity: Not on file   Other Topics Concern  . Not on file   Social History Narrative        PHYSICAL  EXAM   BP 100/72 mmHg  Pulse 48  Ht 6' (1.829 m)  Wt 225 lb 8 oz (102.286 kg)  BMI 30.58 kg/m2  Constitutional: He is oriented to person, place, and time. He appears well-developed and well-nourished. No distress.  HENT: No nasal discharge.  Head: Normocephalic and atraumatic.  Eyes: Pupils are equal and round. Right eye exhibits no discharge. Left eye exhibits no discharge.  Neck: Normal range of motion. Neck supple. No JVD present. No thyromegaly present.  Cardiovascular: Bradycardic, regular rhythm, normal heart sounds and. Exam reveals no gallop and no friction rub. No murmur heard.  Pulmonary/Chest: Surgical scar from CABG.  Effort normal and breath sounds normal. No stridor. No respiratory distress. He has no wheezes. He has no rales. He exhibits no tenderness.  Abdominal: Soft. Bowel sounds are normal. He exhibits no distension. There is no tenderness. There is no rebound and no guarding. There is a surgical scar from previous aneurysm repair.  Musculoskeletal: Normal range of motion. He exhibits no edema and no tenderness.  Neurological: He is alert and oriented to person, place, and time. Coordination normal.  Skin: Skin is warm and dry. No rash noted. He is not diaphoretic. No erythema. No pallor.  Psychiatric: He has a normal mood and affect. His behavior is normal. Judgment and thought content normal.       EKG: Marked sinus  Bradycardia  -Nonspecific QRS widening and anterior fascicular block.   -Nonspecific ST depression   +   T-abnormality.   ABNORMAL    ASSESSMENT AND PLAN

## 2015-02-05 DIAGNOSIS — Z0000 Encounter for general adult medical examination without abnormal findings: Secondary | ICD-10-CM | POA: Insufficient documentation

## 2015-02-05 NOTE — Assessment & Plan Note (Signed)
He is stable from a cardiac standpoint. He is noted to be more bradycardic and thus I decreased metoprolol to 12.5 mg twice daily.

## 2015-02-05 NOTE — Assessment & Plan Note (Signed)
This is being followed by Dr. Lucky Cowboy. Status post surgical repair in 2008.

## 2015-02-05 NOTE — Assessment & Plan Note (Signed)
The patient has known history of coronary artery disease status post CABG. His symptoms have been stable over the last few years with unchanged stress test in 2013. Due to that, the patient is considered at low risk for cardiovascular complications.

## 2015-02-05 NOTE — Assessment & Plan Note (Signed)
Blood pressure is low. Metoprolol was decreased as outlined above.

## 2015-02-12 ENCOUNTER — Telehealth: Payer: Self-pay | Admitting: *Deleted

## 2015-02-12 ENCOUNTER — Ambulatory Visit
Admit: 2015-02-12 | Disposition: A | Discharge status: Home / Self Care | Payer: Self-pay | Attending: Surgery | Admitting: Surgery

## 2015-02-12 DIAGNOSIS — I10 Essential (primary) hypertension: Secondary | ICD-10-CM

## 2015-02-12 LAB — CBC WITH DIFFERENTIAL/PLATELET
BASOS ABS: 0.1 10*3/uL (ref 0.0–0.1)
Basophil %: 0.9 %
EOS ABS: 0.3 10*3/uL (ref 0.0–0.7)
Eosinophil %: 4.8 %
HCT: 38.5 % — ABNORMAL LOW (ref 40.0–52.0)
HGB: 12.2 g/dL — ABNORMAL LOW (ref 13.0–18.0)
LYMPHS ABS: 2.6 10*3/uL (ref 1.0–3.6)
Lymphocyte %: 37.3 %
MCH: 26.1 pg (ref 26.0–34.0)
MCHC: 31.6 g/dL — AB (ref 32.0–36.0)
MCV: 83 fL (ref 80–100)
MONO ABS: 0.5 x10 3/mm (ref 0.2–1.0)
Monocyte %: 7.3 %
NEUTROS ABS: 3.4 10*3/uL (ref 1.4–6.5)
Neutrophil %: 49.7 %
Platelet: 195 10*3/uL (ref 150–440)
RBC: 4.67 10*6/uL (ref 4.40–5.90)
RDW: 16.9 % — AB (ref 11.5–14.5)
WBC: 6.9 10*3/uL (ref 3.8–10.6)

## 2015-02-12 LAB — BASIC METABOLIC PANEL
Anion Gap: 6 — ABNORMAL LOW (ref 7–16)
BUN: 25 mg/dL — AB
CREATININE: 1.23 mg/dL
Calcium, Total: 9.5 mg/dL
Chloride: 105 mmol/L
Co2: 25 mmol/L
EGFR (African American): >60
EGFR (Non-African Amer.): >60
GLUCOSE: 108 mg/dL — AB
Potassium: 4.9 mmol/L
Sodium: 136 mmol/L

## 2015-02-12 MED ORDER — METOPROLOL TARTRATE 25 MG PO TABS: 12.5000 mg | ORAL_TABLET | Freq: Two times a day (BID) | ORAL | Status: DC

## 2015-02-12 NOTE — Telephone Encounter (Signed)
S/w pt reviewed last office visit with Dr.Arida which at that time dosage of Metoprolol was decreased to (12.5 mg ) bid due to bradycardia.  S/w pharmacist Tim @ [facility] today and refilled medication.  Pt aware medication was sent in today.  Changed in chart.

## 2015-02-12 NOTE — Telephone Encounter (Signed)
Pt c/o medication issue:  1. Name of Medication: Metoprolol  2. How are you currently taking this medication (dosage and times per day)? Half of 25mg   3. Are you having a reaction (difficulty breathing--STAT)? No  4. What is your medication issue? Patient is unclear of directions. He was in pre op and the nurse told him it was half of 50mg . Patient would like to make sure he is taking the correct amount.

## 2015-02-20 ENCOUNTER — Ambulatory Visit
Admit: 2015-02-20 | Disposition: A | Discharge status: Home / Self Care | Payer: Self-pay | Attending: Surgery | Admitting: Surgery

## 2015-02-20 ENCOUNTER — Emergency Department
Admit: 2015-02-20 | Disposition: A | Discharge status: Home / Self Care | Payer: Self-pay | Admitting: Emergency Medicine

## 2015-02-20 LAB — URINALYSIS, COMPLETE
BACTERIA: NONE SEEN
BILIRUBIN, UR: NEGATIVE
Blood: NEGATIVE
GLUCOSE, UR: NEGATIVE mg/dL (ref 0–75)
KETONE: NEGATIVE
LEUKOCYTE ESTERASE: NEGATIVE
Nitrite: NEGATIVE
PH: 6 (ref 4.5–8.0)
Protein: NEGATIVE
RBC,UR: NONE SEEN /HPF (ref 0–5)
SQUAMOUS EPITHELIAL: NONE SEEN
Specific Gravity: 1.014 (ref 1.003–1.030)
WBC UR: NONE SEEN /HPF (ref 0–5)

## 2015-02-20 LAB — BASIC METABOLIC PANEL
Anion Gap: 4 — ABNORMAL LOW (ref 7–16)
BUN: 24 mg/dL — AB
CHLORIDE: 105 mmol/L
Calcium, Total: 8.8 mg/dL — ABNORMAL LOW
Co2: 26 mmol/L
Creatinine: 1.37 mg/dL — ABNORMAL HIGH
EGFR (African American): >60
EGFR (Non-African Amer.): 55 — ABNORMAL LOW
Glucose: 99 mg/dL
Potassium: 4.5 mmol/L
Sodium: 135 mmol/L

## 2015-03-02 ENCOUNTER — Ambulatory Visit: Payer: No Typology Code available for payment source | Admitting: Cardiovascular Disease

## 2015-03-06 NOTE — Op Note (Signed)
PATIENT NAME:  Stuart Harvey, DESHLER MR#:  [id number] DATE OF BIRTH:  1951-10-03  DATE OF PROCEDURE:  12/05/2012  PREOPERATIVE DIAGNOSIS: Abdominal aortic aneurysm of approximately 6 cm in maximal diameter status post previous endovascular repair with type I and III endoleaks.   POSTOPERATIVE DIAGNOSIS: Abdominal aortic aneurysm of approximately 6 cm in maximal diameter status post previous endovascular repair with type I and III endoleaks.   PROCEDURES: 1. Ultrasound guidance for vascular access to bilateral femoral arteries, right by Dr. Lucky Cowboy and left by Dr. Delana Meyer.  2. Catheter placement in the aorta from bilateral femoral approaches, right by Dr. Lucky Cowboy and left by Dr. Delana Meyer. 3. Placement of two aortic extension cuffs, 32 mm in diameter, bridging previous cuffs and the main body of the previously placed endoprosthesis with the addition of Aptus securement system, co-surgeons. 4. Perclose closure device of bilateral femoral arteries, right by Dr. Lucky Cowboy and left by Dr. Delana Meyer.    SURGEON: Leotis Pain, MD  CO-SURGEON: Hortencia Pilar, MD  ANESTHESIA: General.   ESTIMATED BLOOD LOSS: Approximately 100 mL.  FLUOROSCOPY TIME: 29 minutes.   CONTRAST USED: 20 mL.   INDICATION FOR PROCEDURE: This is a 64 year old male with an abdominal aortic aneurysm. Approximately 5 to 6 years ago this was repaired by another vascular surgeon who had followed his aneurysm. This originally showed shrinkage of the sac size, but over the past two years there has been about 2 cm growth in the sac, from 4 cm to 6 cm, and CT scan demonstrated an obvious endoleak with the main graft being down and separated from the proximal aortic extension cuffs by about 2 cm. This was essentially a type I and III endoleak and we plan to bridge that gap and cover the area. Risks and benefits were discussed and informed consent was obtained.   DESCRIPTION OF PROCEDURE: The patient was brought to vascular interventional radiology suite with  the help of our anesthesia colleagues providing a general anesthetic.  The groins were sterilely prepped and draped and a sterile surgical field was created. We accessed the femoral arteries under direct ultrasound guidance.  I accessed the right and Dr. Delana Meyer accessed the left and then placed 6 French sheaths. The Perclose system was then placed with two crossing Perclose devices on the right and Dr. Delana Meyer would later use a Perclose device to close the left groin. Pigtail catheter was placed and imaging was performed. This demonstrated a clear large endoleak into the sac that was consistent with CT scan. At this point, to bridge the gap, it required two aortic extension cuffs, 32 mm in diameter. This allowed [place] to bridge the area. We started from proximal first and the distal second. The Coda balloon was then used to seal the junction points and then three Aptus screws proximally and two Aptus screws distally were used to help secure the cuffs to the previously placed devices. At this point, completion angiogram was performed which showed excellent flow through the stent graft without significant type I, II or III endoleak and we were pleased with the result. At this point, we elected to terminate the procedure. I removed the 18 French sheath and completed the Perclose devices on the right with excellent hemostatic result. Dr. Delana Meyer placed one Perclose on the left with an excellent hemostatic result. The skin incisions were closed with a single 4-0 Monocryl and Dermabond was placed. The patient was then awakened from anesthesia and taken to the recovery room in stable condition having tolerated the procedure  well.  ____________________________ Algernon Huxley, MD jsd:sb D: 12/05/2012 10:34:43 ET T: 12/05/2012 12:01:52 ET JOB#: [id number]  cc: Algernon Huxley, MD, <Dictator> Guadalupe Maple, MD Algernon Huxley MD ELECTRONICALLY SIGNED 12/06/2012 9:10

## 2015-03-06 NOTE — Op Note (Signed)
PATIENT NAME:  Stuart Harvey, Stuart Harvey MR#:  [id number] DATE OF BIRTH:  November 30, 1950  DATE OF PROCEDURE:  12/05/2012  Addendum.  This is a continuation.   The 7 x 4 balloon is used to dilate the left limb.  The Aptus system is then advanced through the 18 French dry seal and three screws were placed proximally to secure the cuffs together and two screws were then placed more distally.  After completing this maneuver the Aptus system was removed.  A pigtail catheter was advanced up the left.  Final angiography was performed.  There were no leaks noted with a complete seal.   The sheaths were then removed and the ProGlide secured.  Both sides were successful.  There were no immediate complications.   INTERPRETATION:  Initial view of the abdominal aortic aneurysm demonstrates complete separation of the proximal cuffs with the main body.  There is rapid filling of the aneurysm sac with contrast.  Post cuff placement there is a complete seal with no evidence of type I or type II endoleak.   SUMMARY:  Successful repair of endovascular aneurysm as described above.     ____________________________ Katha Cabal, MD ggs:ea D: 12/05/2012 17:38:33 ET T: 12/05/2012 23:19:45 ET JOB#: [id number]  cc: Katha Cabal, MD, <Dictator> Katha Cabal, MD  Katha Cabal MD ELECTRONICALLY SIGNED 12/07/2012 10:05

## 2015-03-06 NOTE — Op Note (Signed)
PATIENT NAME:  Stuart Harvey, Stuart Harvey MR#:  [id number] DATE OF BIRTH:  10/30/1951  DATE OF PROCEDURE:  12/05/2012  PREOPERATIVE DIAGNOSES: 1.  Abdominal aortic aneurysm.  2.  Complication of vascular device.   POSTOPERATIVE DIAGNOSES:  1.  Abdominal aortic aneurysm.  2.  Complication of vascular device.  PROCEDURE PERFORMED:  1.  Repair of abdominal aortic aneurysm with endovascular aortic stent graft using 2 extender cuffs.  2.  Introduction catheter into aorta, right groin, percutaneous.  3.  Introduction catheter, left groin into aorta, percutaneous.  4.  Preclose closure of both right and left groins using the ProGlide device.   PROCEDURE PERFORMED BY:  Dr. Lucky Cowboy.   CO-SURGEON:  Dr. Delana Meyer.    ANESTHESIA:  General by endotracheal intubation.   FLUIDS:  Per anesthesia record.   ESTIMATED BLOOD LOSS:  Minimal.   SPECIMEN:  None.    INDICATIONS:  The patient is a 64 year old gentleman who presented to the office for followup of his abdominal aortic aneurysm.  He underwent endovascular repair of his aneurysm approximately 6 years ago by Dr. Hulda Humphrey.  Examination including noninvasive studies as well as CAT scans demonstrated there has been a complete separation of the main body from the proximal cuffs and that the aneurysm is now repressurized and greater than 5 cm.  The risks and benefits for endovascular repair were reviewed with the patient.  All questions were answered and the patient has agreed to undergo repair of his abdominal aortic aneurysm to prevent lethal rupture.   PROCEDURE:  The patient is taken to the special procedure suite, placed in the supine position.  After adequate general anesthesia is induced and appropriate invasive monitors are placed, he is prepped and draped both groins bilaterally in a sterile fashion.  Appropriate timeout is called.   Ultrasound is then placed in a sterile sleeve with Dr. Lucky Cowboy working on the right, myself on the left.  Ultrasound is used to access  the right common femoral artery.  The common femoral artery is identified.  It is echolucent, pulsatile indicating patency.  Image is recorded for the record, and a Seldinger needle is inserted into the anterior wall with ultrasound visualization.  A J-wire is advanced, followed by placement of a 6 French sheath.  In a similar fashion the left groin was accessed using ultrasound.  The femoral artery is echolucent, pulsatile indicating patency.  Image is recorded.  And under real-time visualization, a micropuncture needle is inserted, microwire followed by micro sheath, J-wire followed by a 6 French sheath.   ProGlide devices are then deployed, but not secured, rotating them approximately 30 degrees, one to the left, one to the right over the wire in the right groin.  Hemostats are then secured.  These are used to secure these sutures and in a similar fashion 2 ProGlides are deployed on the left.  An 101 French dry seal sheath is then prepped on the field.  After the ProGlides are placed J-wires are advanced, bilateral 8 [place] sheaths are placed, and the marker pigtail catheter is advanced up the right side.  J-wire is advanced up the left.  The bolus injection of contrast is then utilized to demonstrate the anatomy at the aneurysm.  After measurements are made it is decided to use 2 aortic extender cuffs.  Two 32 x 45 cm cuffs are then opened onto the field.  Amplatz superstiff wire is advanced through the pigtail catheter and an 18 French dry seal sheath is advanced and exchanged for  the 8 [place].  The first 32 x 45 extender cuff was then deployed proximally and the second distally.  Coda balloon is then advanced up the wire.  The pigtail catheter was then advanced up the left groin and injection of contrast was utilized while the Coda balloon was post dilating the cuffs on the right limb, a 7 x 4 balloon was used to dilate the left limb.  Final angiography was then performed.  No endoleaks were noted.  The  sheaths were then pulled and the ProGlide devices were secured in the order that they were put in, doing the right groin first and then the left.  Light pressure was held and there were no immediate complications.   INTERPRETATION:  Initial views demonstrate that the complete separation has now re-pressurized the aneurysm and there is rapid flow of contrast into the aneurysm sac.  Following deployment of the two cuffs and then posting with the [facility], there is now a complete seal.     ____________________________ Katha Cabal, MD ggs:ea D: 12/05/2012 17:35:48 ET T: 12/05/2012 23:02:35 ET JOB#: [id number]  cc: Katha Cabal, MD, <Dictator> Katha Cabal MD ELECTRONICALLY SIGNED 12/07/2012 10:05

## 2015-03-06 NOTE — Discharge Summary (Signed)
PATIENT NAME:  Stuart Harvey, Stuart Harvey MR#:  [id number] DATE OF BIRTH:  Jul 09, 1951  DATE OF ADMISSION:  12/05/2012 DATE OF DISCHARGE:  12/06/2012  PREOPERATIVE DIAGNOSIS: Enlarging abdominal aortic aneurysm with significant endoleak present from previously placed stent graft repair.   PROCEDURES PERFORMED WHILE IN HOSPITAL: Repair of abdominal aortic aneurysm with extension cuffs, in the Aptus device. For full details of that, please see that dictated operative summary.   BRIEF HISTORY: This is a 64 year old white male who I saw in the office for an abdominal aortic aneurysm. He had had endovascular player repair several years ago. His previous vascular surgeon had left the medical community. Over the past 2 years, he had had over 2 cm of growth in this aneurysm and a CT scan showed that his aortic extension cuffs, in his main body, were separated and there was a large endoleak into the sac.  He was brought in to try to bridge the previously placed cuffs, in the main body, to exclude the aneurysm from flow and in doing this repair of the aneurysm.   HOSPITAL COURSE: The patient was admitted through same day surgery and taken to the operating room where the above-mentioned procedure was performed. For full details of that, please see the dictated operative summary. He did very well. He was observed over night on the floor. His labs the following morning were all essentially normal. He will resume all of his home medications and was given Norco as needed for pain. His diet will be regular. His activity will be as tolerated. His return appointment will be in 3 to 4 weeks in our office. ____________________________ Algernon Huxley, MD jsd:sb D: 12/10/2012 09:11:46 ET T: 12/10/2012 09:22:30 ET JOB#: [id number]  cc: Algernon Huxley, MD, <Dictator> Algernon Huxley MD ELECTRONICALLY SIGNED 12/12/2012 7:34

## 2015-03-07 NOTE — Discharge Summary (Signed)
PATIENT NAME:  Stuart Harvey, Stuart Harvey MR#:  [id number] DATE OF BIRTH:  Dec 24, 1950  DATE OF ADMISSION:  08/13/2014 DATE OF DISCHARGE:  08/16/2014  ADMITTING DIAGNOSIS: Degenerative arthritis of the right knee.   DISCHARGE DIAGNOSIS: Degenerative arthritis of the right knee.   PROCEDURE PERFORMED: Right total knee arthroplasty using computer-assisted navigation.   SURGEON: Laurice Record. Hooten, MD.   ASSISTANTMarylee Floras, PA.   ANESTHESIA: Spinal.   ESTIMATED BLOOD LOSS: 500 mL.  FLUIDS REPLACED: 2200 mL of crystalloid.   TOURNIQUET TIME: 95 minutes.   DRAINS: Two medium drains to reinfusion system.   SOFT TISSUE RELEASES: Anterior cruciate ligament, posterior cruciate ligament, deep medial collateral ligament and patellofemoral ligament.   IMPLANTS UTILIZED: DePuy PFC Sigma size 3 posterior stabilized femoral component, size 5 MBT tibial component, 12.5 mm stabilized rotating platform polyethylene insert and a 41 mm 3 peg oval dome patella.   HISTORY: The patient is a 64 year old male who presents today for history and physical. He is scheduled for a right total knee arthroplasty on 08/13/2014. No change in his condition since last year. The patient has a several year history of progressive bilateral knee pain with left more symptomatic than the right. He localizes most of the pain along the medial aspect of the knee. The pain is aggravated by weight-bearing activities. He does report some near giving way of the knee as well as activity-related swelling. He is not currently using any ambulatory aids. He has not seen any significant improvement despite use of NSAIDs. He has had no relief with cortisone injections and activity modification. Pain has progressive to the point that it is significantly interfering with his activities of daily living.   PHYSICAL EXAMINATION:  GENERAL: Well-developed, well-nourished male seen in no acute distress. Antalgic gait. Varus thrust of the right and left knee.   HEENT: Atraumatic, normocephalic. Pupils are equal, round and reactive light. Extraocular motion is intact. Sclerae clear. Oropharynx is clear with moist mucosa.  NECK: Supple, nontender with good range of motion. No thyromegaly, adenopathy, JVD or carotid bruits.  LUNGS: Clear to auscultation bilaterally.  CARDIOVASCULAR: Regular rate and rhythm. Normal S1, S2. No murmur. No appreciable gallops or rubs. Peripheral pulses are palpable. No lower extremity edema. Homans sign is negative.  ABDOMEN: Soft, nontender and nondistended. Bowel sounds are positive.  EXTREMITIES: Good strength, stability and range of motion the upper extremities as well as the hips and ankles. Left knee: Soft tissue swelling moderate. There is a mild effusion. There is no erythema. There is mild crepitus in the knee on range of motion. The patient is tender along the medial joint line. There is relative varus alignment. There is mild medial laxity. There is negative posterior sagittal test. He has good tracking of the patella without subluxation or tilt. There is good quadriceps tone. Range of motion is 0 to 120 degrees. He is neurovascularly intact in the left lower extremity.   HOSPITAL COURSE: The patient was admitted to the hospital on 08/13/2014. He had surgery that same day and was brought to the orthopedic floor from the PACU in stable condition. On postoperative day 1, the patient had an acute postoperative blood loss anemia with a hemoglobin of 11.6. On postoperative day 2, this was rechecked and trended down minimally to 10.7. The patient's vital signs on postoperative day 1 and 2 were normal. He made excellent progress with physical therapy each day. By postoperative day 3, the patient's vital signs were stable. Pain was well controlled. He had  met all goals for going home with home health PT. His vital signs remained stable. The patient was stable and ready for discharge to home with home health PT.   CONDITION AT  DISCHARGE: Stable.   DISCHARGE INSTRUCTIONS: The patient may gradually increase weight-bearing on the affected extremity. Elevate the affected foot or leg on 1 or 2 pillows with the foot higher than the knee. Thigh-high TED hose on both legs and remove at bedtime, replace when arising the next morning. Elevate the heels off the bed. Incentive spirometer every hour while awake. Encourage cough and deep breathing. The patient may resume a regular diet as tolerated. Continue using Polar Care unit maintaining temperature between 40 and 50 degrees. Do not get the dressing or bandage wet or dirty. Call [facility] orthopedics if the dressing gets water under it. Leave the dressing on. Call [facility] orthopedics if any of the following occur: Bright red bleeding from the incision wound, fever above 101.5 degrees, redness, swelling or drainage at the incision site. Call [facility] orthopedics if you experience any increased leg pain, numbness or weakness in your legs or bowel or bladder symptoms. Home physical therapy has been arranged for continuation of rehab program. Please call [place] orthopedics if a therapist has not contacted you within 48 hours of your return home. The patient will need to be seen in [facility] orthopedics in 2 weeks. Please call to make sure appointment is scheduled and confirmed on 08/28/2014 at 9:15 a.m. with Marylee Floras, PA-C.   DISCHARGE MEDICATIONS: Please see list of discharge medications on discharge instructions.     ____________________________ T. Rachelle Hora, PA-C tcg:TT D: 08/16/2014 08:48:02 ET T: 08/16/2014 19:04:18 ET JOB#: [id number]  cc: T. Rachelle Hora, PA-C, <Dictator> Duanne Guess [place] ELECTRONICALLY SIGNED 08/21/2014 9:31

## 2015-03-07 NOTE — Op Note (Signed)
PATIENT NAME:  Stuart Harvey, Stuart Harvey MR#:  [id number] DATE OF BIRTH:  Aug 01, 1951  DATE OF PROCEDURE:  08/13/2014  PREOPERATIVE DIAGNOSIS: Degenerative arthrosis of the right knee.   POSTOPERATIVE DIAGNOSIS: Degenerative arthrosis of the right knee.   PROCEDURE PERFORMED: Right total knee arthroplasty using computer-assisted navigation.   SURGEON: Skip Estimable, MD   ASSISTANT: Vance Peper, PA (required to maintain retraction throughout the procedure).   ANESTHESIA: Spinal.   ESTIMATED BLOOD LOSS: 500 mL (as per circulating nurse.)   FLUIDS REPLACED: Crystalloid 2200 mL.   TOURNIQUET TIME: Ninety-five minutes.   DRAINS: Two medium drains to reinfusion system.   SOFT TISSUE RELEASES: Anterior cruciate ligament, posterior cruciate ligament, deep medial collateral ligament and patellofemoral ligament.   IMPLANTS UTILIZED: DePuy PFC Sigma size 3 posterior stabilized femoral component (cemented), size 5 MBT tibial component (cemented), 12.5 mm stabilized rotating platform polyethylene insert, and 41 mm 3-peg oval dome patella (cemented).   INDICATIONS FOR SURGERY: The patient is a 64 year old male who has been seen for complaints of progressive bilateral knee pain with right knee more symptomatic than the left. X-rays demonstrated severe degenerative changes in tricompartmental fashion with relative varus deformity. After discussion of the risks and benefits of surgical intervention, the patient expressed understanding of the risks and benefits and agreed with plans for surgical intervention.   PROCEDURE IN DETAIL: The patient was brought into the operating room, and after adequate spinal anesthesia was achieved, a tourniquet was placed on the patient's upper right thigh. The patient's right knee and leg were cleaned and prepped with alcohol and DuraPrep, draped in the usual sterile fashion. A "timeout" was performed as per usual protocol. The right lower extremity was exsanguinated using an Esmarch,  and the tourniquet was inflated to 300 mmHg. An anterior longitudinal incision was made followed by standard mid vastus approach. A large effusion was evacuated. Deep fibers of the medial collateral ligament were elevated in a subperiosteal fashion off the medial flare of the tibia so as to maintain a continuous soft tissue sleeve. The patella was subluxed laterally and the patellofemoral ligament was incised. Inspection of the knee demonstrated severe degenerative changes with full-thickness loss of articular cartilage to the medial compartment. Prominent osteophytes were debrided. Anterior and posterior cruciate ligaments were excised. Two 4.0 mm Schanz pins were inserted into the femur and into the tibia for attachment of the ray of trackers used for computer-assisted navigation. Hip center was identified using circumduction technique. Distal landmarks were mapped using the computer. The distal femur and proximal tibia were mapped using the computer. Distal femoral cutting guide was positioned using computer-assisted navigation and cut was performed and verified using the computer. Distal femur was sized. It was felt that a size 3 femoral component was appropriate. A size 3 cutting guide was positioned and the anterior cut was performed and verified using the computer. This was followed by completion of the posterior and chamfer cuts. Femoral cutting guide for a central box was then positioned and the central box cut was performed. Attention was then directed to the proximal tibia. Medial and lateral menisci were excised. The extramedullary tibial cutting guide was positioned using computer-assisted navigation so as to achieve 0 degree varus valgus alignment and 0 degree posterior slope. Cut was performed and verified using the computer. The proximal tibia was sized and it was felt that a size 5 tibial tray was appropriate. Tibial and femoral trials were inserted followed by insertion of first a 10 and  subsequently a 12.5 mm  polyethylene insert. This allowed for excellent mediolateral soft tissue balancing, both in full extension and in flexion. Finally, the patella was cut and repaired so as to accommodate a 41 mm 3-peg oval dome patella. Patellar trial was placed and the knee was placed through a range of motion with excellent patellar tracking appreciated. The femoral trial was removed after debridement of posterior osteophytes. Central post hole for the tibial component was reamed followed by insertion of a keel punch. Tibial trial was then removed. The cut surfaces of bone were irrigated with copious amounts of normal saline with antibiotic solution using pulsatile lavage and then suctioned dry. Polymethyl methacrylate cement was prepared in the usual fashion using a vacuum mixer. Cement was applied to the cut surface of the proximal tibia as well as along the undersurface of a size 5 MBT tibial component. The tibial component was positioned and impacted into place. Excess cement was removed using [employer] fast streamer. Cement was then applied to the posterior flanges of a size 3 posterior stabilized femoral component. Femoral component was positioned and impacted into place. Excess cement was removed using freer elevators. A 12.5 mm polyethylene trial was inserted and the knee was brought in full extension with steady axial compression applied. Finally, cement was applied to the backside of a 41 mm 3-peg oval dome patella, and the patellar component was positioned and patellar clamp applied. Excess cement was removed using freer elevators.   After adequate curing of cement, the tourniquet was deflated after total tourniquet time of 95 minutes. Hemostasis was achieved using electrocautery. The knee was irrigated with copious amounts of normal saline with antibiotic solution using pulsatile lavage and then suctioned dry. The knee was inspected for any residual cement debris. Then, 20 mL of 1.3% Exparel and 40 mL  of normal saline was injected along the posterior capsule, medial and lateral gutters, and along the arthrotomy site. A 12.5 mm stabilized rotating platform polyethylene insert was inserted and the knee was placed through a range of motion with excellent patellar tracking appreciated and excellent mediolateral soft tissue balancing noted. Two medium drains were placed in the wound bed and brought out through a separate stab incision to be attached to reinfusion system. The medial parapatellar portion of the incision was reapproximated using #1 Vicryl. The subcutaneous tissue was injected with 30 mL of 0.25% Marcaine with epinephrine. The subcutaneous tissue was approximated in layers using first #0 Vicryl followed by #2-0 Vicryl. The skin was closed with skin staples. A sterile dressing was applied.   The patient tolerated the procedure well. He was transported to the recovery room in stable condition.    ____________________________ Laurice Record. Holley Bouche., MD jph:TT D: 08/13/2014 18:55:59 ET T: 08/13/2014 19:53:28 ET JOB#: [id number]  cc: Laurice Record. Holley Bouche., MD, <Dictator> JAMES P Holley Bouche MD ELECTRONICALLY SIGNED 08/14/2014 23:14

## 2015-03-15 NOTE — Discharge Summary (Signed)
PATIENT NAME:  Stuart Harvey, Stuart Harvey MR#:  [id number] DATE OF BIRTH:  Oct 15, 1951  DATE OF ADMISSION:  12/13/2014 DATE OF DISCHARGE:  12/18/2014  DISCHARGE DIAGNOSES:  1.  Acute gastroenteritis. 2.  Acute renal failure.  3.  Possible small bowel mass.   SECONDARY DIAGNOSES:   1. Hypertension.  2. Coronary artery disease status post bypass surgery.  3. Abdominal aortic aneurysm.  4.   Osteoarthritis.    CONSULTATIONS:  1.  GI, [place], MD. 2.  Surgery, Rochel Brome, MD.  PROCEDURES AND RADIOLOGY: EGD by Dr. Candace Cruise on February 1, could only reach up to proximal jejunum, not able to reach mid jejunum where the mass was originally seen.   CT scan of the abdomen and pelvis with contrast on January 30 showed patent bifurcated aortic stent graft with decreasing diameter of aneurysmal sac of 4.6 cm. A 3.6 cm mural lesion in the mid jejunum without obstruction. Small bowel follow through on February 2 showed suspected mass in the mid jejunum was visualized. Mild distention of more proximal jejunal loops with some suppression of the loops without definite mesenteric abnormality. Terminal ileum portion was normal.   MAJOR LABORATORY PANEL: UA on admission was negative. Stool cultures were negative. Giardia panel was negative.   HISTORY AND SHORT HOSPITAL COURSE: The patient is a 64 year old male with the above-mentioned medical problems who was admitted for nausea, vomiting, diarrhea, thought to be acute gastroenteritis in nature. Please see Dr. Leta Baptist dictated history and physical for further details. The patient underwent CT scan of the abdomen and pelvis, which showed some small jejunal mass for which GI consultation was obtained with Dr. Verdie Shire who did upper endoscopy scope with pediatric scope, but still was not able to reach mid jejunal part, the most he could go up to was proximal jejunum. He recommended surgical consult with Dr. Rochel Brome, which was obtained, who recommended small  bowel follow through, which was performed and that also confirmed possible jejunal mass, but the patient was symptomatically improving. So after discussion with Dr. Verdie Shire and Dr. Tamala Julian, surgical plan was held off for any emergent surgery. The patient was started on regular diet and he tolerated it fine. It was decided to have outpatient workup including possible video capsule endoscopy and/or surgery as needed. The patient was in agreement with same and was discharged home on February 4 in stable condition. On the date of discharge, his vital signs are as follows: Temperature 97.8, heart rate 50 per minute, respirations 18 per minute, blood pressure 139/84. He was saturating 96% on room air.   PERTINENT PHYSICAL EXAMINATION ON THE DATE OF DISCHARGE: CARDIOVASCULAR: S1, S2 normal. No murmur, rub, or gallop.  LUNGS: Clear to auscultation bilaterally. No wheezing, rales, rhonchi, crepitation.  ABDOMEN: Soft, benign.  NEUROLOGIC: Nonfocal examination. All other physical examination remained at baseline.   DISCHARGE MEDICATIONS:  Medication Instructions  omeprazole 20 mg oral delayed release capsule  1 cap(s) orally once a day.   metoprolol tartrate 25 mg oral tablet  1 tab(s) orally 2 times a day   lisinopril 5 mg oral tablet  1 tab(s) orally once a day.   co q-10 100 mg oral capsule  2 cap(s) orally once a day (in the morning)   citalopram 20 mg oral tablet  1 tab(s) orally 2 times a day   acetaminophen 500 mg oral tablet  1 tab(s) orally every 4 hours, As needed, fever   docusate-senna 50 mg-8.6 mg oral tablet  1 tab(s) orally 2 times a day   crestor 40 mg oral tablet  1 tab(s) orally once a day   levothyroxine 50 mcg (0.05 mg) oral tablet  1 tab(s) orally once a day   bupropion 200 mg/12 hours oral tablet, extended release  1 tab(s) orally 2 times a day   aspirin 81 mg oral delayed release tablet  1 tab(s) orally once a day     DISCHARGE DIET: Regular.  DISCHARGE ACTIVITY: As tolerated.    DISCHARGE INSTRUCTIONS AND FOLLOWUP: The patient was instructed to follow up with his primary care physician, Dr. Golden Pop, in 4 to 6 weeks. He will need followup with Dr. Verdie Shire from GI in 1-2 weeks and Dr. Rochel Brome from surgery in 2-4 weeks.   TOTAL TIME DISCHARGING THIS PATIENT: Forty-five minutes.   ____________________________ Lucina Mellow. Manuella Ghazi, MD vss:TT D: 12/18/2014 15:45:24 ET T: 12/18/2014 19:47:49 ET JOB#: [id number]  cc: Alexzandrea Normington S. Manuella Ghazi, MD, <Dictator> Guadalupe Maple, MD Lupita Dawn. Candace Cruise, MD Remer Macho MD ELECTRONICALLY SIGNED 12/22/2014 14:33
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
## 2015-03-15 NOTE — Consult Note (Signed)
EGD with pediatric colonoscope performed. Prior surgery seen in distal duodenum. Scope only reached proximal jejunum. Not able to reach mid jejunum. As discussed with Dr. Nicholes Stairs, please order SB series for tomorrow to confirm mass. thanks.  Electronic Signatures: Verdie Shire (MD)  (Signed on 01-Feb-16 14:59)  Authored  Last Updated: 01-Feb-16 14:59 by Verdie Shire (MD)

## 2015-03-15 NOTE — Op Note (Signed)
PATIENT NAME:  Stuart Harvey, Stuart Harvey MR#:  [id number] DATE OF BIRTH:  October 06, 1951  DATE OF PROCEDURE:  02/20/2015  PREOPERATIVE DIAGNOSIS: Ventral hernia, umbilical hernia.   POSTOPERATIVE DIAGNOSIS: Ventral hernia, umbilical hernia  PROCEDURE: Ventral hernia repair and umbilical hernia repair.   SURGEON: Rochel Brome, MD.   ANESTHESIA: General.   INDICATIONS: This 64 year old male was recently admitted to the hospital with small bowel obstructive symptoms and had findings of a ventral hernia which was in the left lower quadrant paramedian area adjacent to an old surgical scar. There was also noted a smaller umbilical hernia. The obstructive symptoms resolved. The patient has been back home for a period of time, has had cardiac consultation and elective ventral hernia and umbilical hernia repairs were recommended for definitive treatment.   DESCRIPTION OF PROCEDURE: The patient was placed on the operating table in the supine position under general anesthesia. The abdomen was clipped and prepared with ChloraPrep and draped in a sterile manner.   At this site of the ventral hernia, a transversely oriented incision was made extending from the lower abdominal midline towards the left side, extending some 6 cm in length. This incision was approximately 5 cm below the umbilicus. The incision was carried down through subcutaneous tissues. Numerous small bleeding points were cauterized. There was a ventral hernia sac encountered, which was dissected free from surrounding structures and also separated from the fascial ring defect and was inverted. The properitoneal fat was dissected away from the fascia circumferentially, extending back approximately 1.5 cm.  Next, a Bard soft mesh was cut to create an oval shape of some 2.5 x 3.5 cm and placed into the properitoneal plane and was sutured to the overlying fascia with through and through 0 Surgilon sutures. Next the fascial defect was closed with a transversely  oriented suture line of interrupted 0 Surgilon figure-of-eight sutures incorporating mesh into each suture. The repair looked good. Hemostasis was intact. Subcutaneous tissues were infiltrated with 0.5% Sensorcaine with epinephrine. The superficial fascia was approximated with interrupted 3-0 chromic.  The skin was closed with running 4-0 Monocryl subcuticular suture.   Attention was turned to the umbilicus where a supraumbilical curvilinear incision was made from approximately 9 o'clock position, around past the midline to the 4 o'clock position, and carried down through a thin layer of subcutaneous tissue to encounter an umbilical hernia sac. The sac was dissected free from surrounding structures and was inverted and the properitoneal fat was separated from the fascial ring defect circumferentially, extending back approximately 1 cm.  The fascial ring defect itself was approximately 1.8 cm in dimension. A Bard soft mesh was cut to create an oval shape of some 1.8 x 3 cm and placed into the properitoneal plane, oriented transversely and was sutured to the overlying fascia with 0-Surgilon and also, the fascial ring defect was closed with a transversely oriented suture line of interrupted 0-Surgilon figure-of-eight sutures incorporating mesh into the repair.  The hemostasis appeared to be intact. The skin of the umbilicus was sutured to the subcutaneous tissues with a 3-0 chromic pursestring suture.  Next, the skin was closed with running 4-0 Monocryl. As noted, the subcutaneous tissues were also infiltrated with 0.5% Sensorcaine with epinephrine.  Both incisions were treated with LiquiBand and allowed to dry.    The patient appeared to be in satisfactory condition and was prepared for transfer to the recovery room.     ____________________________ Lenna Sciara. Rochel Brome, MD jws:DT D: 02/20/2015 09:37:32 ET T: 02/20/2015 12:41:22 ET JOB#:  [id number]  cc: Loreli Dollar, MD, <Dictator> Loreli Dollar  MD ELECTRONICALLY SIGNED 02/21/2015 10:52
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
## 2015-03-15 NOTE — Consult Note (Signed)
Pt seen and examined. Full consult to follow. Bouts of nausea/vomiting/diarreha associated with left sided soreness at least since Tues. CT showed 3.6cm mass in mid jejunum. Video capsule suggested by radiology is unlikely to help at this time. Capsule study is diagnostic. Also, concerned that capsule will lodge where mass is. I will attempt to reach this area with pediatric colonoscope tomorrow. If reachable, then biopsies will be taken. Unlikely that mass this size will be removable. Recommend that general surgery be consulted. Even if this mass is benign, patient will likely need surgery due to the symptoms that patient is experiencing. Wife requests Dr. Nicholes Stairs for consultation. thanks  Electronic Signatures: Verdie Shire (MD)  (Signed on 31-Jan-16 10:20)  Authored  Last Updated: 31-Jan-16 10:20 by Verdie Shire (MD)
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
## 2015-04-22 ENCOUNTER — Other Ambulatory Visit: Payer: Self-pay

## 2015-04-22 MED ORDER — OMEPRAZOLE 20 MG PO CPDR: 20.0000 mg | DELAYED_RELEASE_CAPSULE | Freq: Every day | ORAL | Status: DC

## 2015-05-20 ENCOUNTER — Other Ambulatory Visit: Payer: Self-pay

## 2015-05-20 ENCOUNTER — Other Ambulatory Visit: Payer: Self-pay | Admitting: *Deleted

## 2015-05-20 ENCOUNTER — Telehealth: Payer: Self-pay | Admitting: Unknown Physician Specialty

## 2015-05-20 MED ORDER — LEVOTHYROXINE SODIUM 50 MCG PO TABS: 50.0000 ug | ORAL_TABLET | Freq: Every day | ORAL | Status: DC

## 2015-05-20 MED ORDER — METOPROLOL TARTRATE 25 MG PO TABS: 12.5000 mg | ORAL_TABLET | Freq: Two times a day (BID) | ORAL | Status: DC

## 2015-05-20 NOTE — Telephone Encounter (Signed)
Covering for primary Last TSH June 2015; normal range I do not see any TSH readings in hospital records (Epic/CHL) over the last year Last visit in December, he was supposed to return to see Malachy Mood in 3 months (that would have been March 2016) Approve one month refill, with note to scheduler: ask patient to come in to see Kathrine Haddock, [facility] for visit and get fasting labs in the next 4 weeks please

## 2015-05-20 NOTE — Telephone Encounter (Signed)
E-Fax came through for refill: Rx: levothyroxine (SYNTHROID, LEVOTHROID) 50 MCG tablet Copy in basket

## 2015-05-21 NOTE — Telephone Encounter (Signed)
Called and scheduled patient an appointment for June 16, 2015 with fasting labs.

## 2015-05-25 ENCOUNTER — Telehealth: Payer: Self-pay | Admitting: Unknown Physician Specialty

## 2015-05-25 MED ORDER — LISINOPRIL 5 MG PO TABS: 5.0000 mg | ORAL_TABLET | Freq: Every day | ORAL | Status: DC

## 2015-05-25 NOTE — Telephone Encounter (Signed)
I am closing this encounter because I have already sent the refill request to the provider when it came through via fax.

## 2015-05-25 NOTE — Telephone Encounter (Signed)
Routing to provider. Patient was last seen 10/15/14 and was supposed to have a follow up scheduled in March but did not schedule. A different refill came in last week so I called and scheduled the patient a follow-up for June 16, 2015 with Malachy Mood. This medication was last refilled on 10/15/14 with 6 refills. Practice partner number is [id number] and pharmacy is [facility].

## 2015-05-25 NOTE — Telephone Encounter (Signed)
K+ and creatinine from December 2015 reviewed; Rx approved

## 2015-05-25 NOTE — Telephone Encounter (Signed)
E-fax came through for refill on: Rx: lisinopril (PRINIVIL,ZESTRIL) 5 MG tablet Copy in basket

## 2015-06-16 ENCOUNTER — Encounter: Payer: Self-pay | Admitting: Unknown Physician Specialty

## 2015-06-16 ENCOUNTER — Ambulatory Visit (INDEPENDENT_AMBULATORY_CARE_PROVIDER_SITE_OTHER): Payer: No Typology Code available for payment source | Admitting: Unknown Physician Specialty

## 2015-06-16 VITALS — BP 128/83 | HR 86 | Temp 98.2°F | Wt 227.0 lb

## 2015-06-16 DIAGNOSIS — E785 Hyperlipidemia, unspecified: Secondary | ICD-10-CM

## 2015-06-16 DIAGNOSIS — I10 Essential (primary) hypertension: Secondary | ICD-10-CM

## 2015-06-16 DIAGNOSIS — Z23 Encounter for immunization: Secondary | ICD-10-CM | POA: Diagnosis not present

## 2015-06-16 DIAGNOSIS — Z0000 Encounter for general adult medical examination without abnormal findings: Secondary | ICD-10-CM

## 2015-06-16 DIAGNOSIS — F329 Major depressive disorder, single episode, unspecified: Secondary | ICD-10-CM

## 2015-06-16 DIAGNOSIS — F32A Depression, unspecified: Secondary | ICD-10-CM

## 2015-06-16 LAB — LIPID PANEL PICCOLO, WAIVED
CHOLESTEROL PICCOLO, WAIVED: 167 mg/dL (ref <200)
Chol/HDL Ratio Piccolo,Waive: 4.8 mg/dL
HDL Chol Piccolo, Waived: 35 mg/dL — ABNORMAL LOW (ref >59)
LDL CHOL CALC PICCOLO WAIVED: 80 mg/dL (ref <100)
Triglycerides Piccolo,Waived: 262 mg/dL — ABNORMAL HIGH (ref <150)
VLDL CHOL CALC PICCOLO,WAIVE: 52 mg/dL — AB (ref <30)

## 2015-06-16 LAB — MICROALBUMIN, URINE WAIVED
Creatinine, Urine Waived: 100 mg/dL (ref 10–300)
Microalb, Ur Waived: 30 mg/L — ABNORMAL HIGH (ref 0–19)

## 2015-06-16 MED ORDER — TETANUS-DIPHTH-ACELL PERTUSSIS 5-2.5-18.5 LF-MCG/0.5 IM SUSP
0.5000 mL | Freq: Once | INTRAMUSCULAR | Status: AC
  Administered 2015-06-16: 0.5 mL via INTRAMUSCULAR

## 2015-06-16 MED ORDER — BUPROPION HCL ER (SR) 200 MG PO TB12: 200.0000 mg | ORAL_TABLET | Freq: Two times a day (BID) | ORAL | Status: DC

## 2015-06-16 MED ORDER — LISINOPRIL 5 MG PO TABS: 5.0000 mg | ORAL_TABLET | Freq: Every day | ORAL | Status: DC

## 2015-06-16 MED ORDER — ROSUVASTATIN CALCIUM 40 MG PO TABS: 40.0000 mg | ORAL_TABLET | Freq: Every day | ORAL | Status: DC

## 2015-06-16 MED ORDER — OMEPRAZOLE 20 MG PO CPDR: 20.0000 mg | DELAYED_RELEASE_CAPSULE | Freq: Every day | ORAL | Status: DC

## 2015-06-16 MED ORDER — LEVOTHYROXINE SODIUM 50 MCG PO TABS: 50.0000 ug | ORAL_TABLET | Freq: Every day | ORAL | Status: DC

## 2015-06-16 MED ORDER — CITALOPRAM HYDROBROMIDE 40 MG PO TABS: 40.0000 mg | ORAL_TABLET | Freq: Every day | ORAL | Status: DC

## 2015-06-16 NOTE — Assessment & Plan Note (Signed)
Stable. Patient is under care of cardiologist

## 2015-06-16 NOTE — Assessment & Plan Note (Signed)
Stable. Denies feelings of hopelessness or lack of interest.

## 2015-06-16 NOTE — Progress Notes (Signed)
BP 128/83 mmHg  Pulse 86  Temp(Src) 98.2 F (36.8 C)  Wt 227 lb (102.967 kg)  SpO2 96%   Subjective:    Patient ID: Stuart Harvey, male    DOB: 07-02-1951, 64 y.o.   MRN: [id number]  HPI: Stuart Harvey is a 64 y.o. male  Chief Complaint  Patient presents with  . Hyperlipidemia  . Hypertension   Hypertension/Hyperlipidemia: Stable.  Compliant with medications. Patient denies checking BP mat home but states he does check on occasion at pharmacy and pressure is generally at 125/80's.  Depression: Stable.  PHQ 2 is 0  Denies feelings of hopelessness or lack of interest.   Relevant past medical, surgical, family and social history reviewed and updated as indicated. Interim medical history since our last visit reviewed. Allergies and medications reviewed and updated.  Review of Systems  Constitutional: Negative.   HENT: Negative.   Eyes: Negative.   Respiratory: Negative.   Cardiovascular: Negative.   Gastrointestinal: Negative.   Endocrine: Negative.   Genitourinary: Negative.   Musculoskeletal: Positive for joint swelling.  Skin: Negative.   Allergic/Immunologic: Negative.   Neurological: Negative.   Hematological: Negative.   Psychiatric/Behavioral: Negative.     Per HPI unless specifically indicated above     Objective:    BP 128/83 mmHg  Pulse 86  Temp(Src) 98.2 F (36.8 C)  Wt 227 lb (102.967 kg)  SpO2 96%  Wt Readings from Last 3 Encounters:  06/16/15 227 lb (102.967 kg)  02/03/15 225 lb 8 oz (102.286 kg)  02/28/14 230 lb 4 oz (104.441 kg)    Physical Exam  Constitutional: He is oriented to person, place, and time. He appears well-developed and well-nourished.  HENT:  Head: Normocephalic and atraumatic.  Right Ear: External ear normal.  Left Ear: External ear normal.  Eyes: Conjunctivae are normal. Pupils are equal, round, and reactive to light. Right eye exhibits no discharge. Left eye exhibits no discharge.  Neck: Normal range of  motion. No JVD present.  Cardiovascular: Normal rate, regular rhythm and normal heart sounds.  Exam reveals no gallop and no friction rub.   No murmur heard. Pulmonary/Chest: Effort normal and breath sounds normal. No stridor. No respiratory distress. He has no wheezes. He has no rales. He exhibits no tenderness.  Abdominal: Soft. Bowel sounds are normal. He exhibits no distension and no mass. There is no tenderness. There is no rebound and no guarding.  Musculoskeletal: Normal range of motion.       Left knee: He exhibits swelling.       Legs: Lymphadenopathy:    He has no cervical adenopathy.  Neurological: He is alert and oriented to person, place, and time. He has normal reflexes.  Skin: Skin is warm and dry. No rash noted. No erythema. No pallor.  Psychiatric: He has a normal mood and affect. His behavior is normal. Judgment and thought content normal.       Assessment & Plan:   Problem List Items Addressed This Visit      Unprioritized   Hypertension - Primary   Relevant Orders   Comprehensive metabolic panel   Uric acid   Microalbumin, Urine Waived   Hyperlipidemia   Relevant Orders   Lipid Panel Piccolo, Waived   Comprehensive metabolic panel   Depression   Relevant Medications   citalopram (CELEXA) 40 MG tablet   Other Relevant Orders   TSH   CBC with Differential/Platelet    Other Visit Diagnoses    Routine general  medical examination at a health care facility        Relevant Orders    PSA    TSH    HIV antibody    CBC with Differential/Platelet        Follow up plan: Patient doing well, stable on medications. Patient encouraged to follow up with our providers for hyperlipidemia and left knee pain. Discussed modified exercise to accommodate knee. Reviewed HITT program with benefits. Zostavax and TD vaccines provided during visit. He will return for follow up in 6 months or sooner if needed.

## 2015-06-16 NOTE — Assessment & Plan Note (Addendum)
BP stable on lisinopril. Today's BP 128/83

## 2015-06-17 ENCOUNTER — Other Ambulatory Visit: Payer: Self-pay | Admitting: Unknown Physician Specialty

## 2015-06-17 ENCOUNTER — Encounter: Payer: Self-pay | Admitting: Unknown Physician Specialty

## 2015-06-17 DIAGNOSIS — N183 Chronic kidney disease, stage 3 unspecified: Secondary | ICD-10-CM

## 2015-06-17 LAB — COMPREHENSIVE METABOLIC PANEL
ALT: 13 IU/L (ref 0–44)
AST: 22 IU/L (ref 0–40)
Albumin/Globulin Ratio: 1.6 (ref 1.1–2.5)
Albumin: 4 g/dL (ref 3.6–4.8)
Alkaline Phosphatase: 113 IU/L (ref 39–117)
BUN/Creatinine Ratio: 20 (ref 10–22)
BUN: 28 mg/dL — AB (ref 8–27)
Bilirubin Total: 0.3 mg/dL (ref 0.0–1.2)
CO2: 17 mmol/L — ABNORMAL LOW (ref 18–29)
CREATININE: 1.41 mg/dL — AB (ref 0.76–1.27)
Calcium: 9.2 mg/dL (ref 8.6–10.2)
Chloride: 102 mmol/L (ref 97–108)
GFR calc Af Amer: 61 mL/min/{1.73_m2} (ref >59)
GFR, EST NON AFRICAN AMERICAN: 53 mL/min/{1.73_m2} — AB (ref >59)
Globulin, Total: 2.5 g/dL (ref 1.5–4.5)
Glucose: 112 mg/dL — ABNORMAL HIGH (ref 65–99)
POTASSIUM: 4.8 mmol/L (ref 3.5–5.2)
Sodium: 137 mmol/L (ref 134–144)
TOTAL PROTEIN: 6.5 g/dL (ref 6.0–8.5)

## 2015-06-17 LAB — CBC WITH DIFFERENTIAL/PLATELET
Basophils Absolute: 0 10*3/uL (ref 0.0–0.2)
Basos: 0 %
EOS (ABSOLUTE): 0.4 10*3/uL (ref 0.0–0.4)
EOS: 5 %
HEMOGLOBIN: 12.2 g/dL — AB (ref 12.6–17.7)
Hematocrit: 37.3 % — ABNORMAL LOW (ref 37.5–51.0)
IMMATURE GRANS (ABS): 0 10*3/uL (ref 0.0–0.1)
Immature Granulocytes: 0 %
LYMPHS: 43 %
Lymphocytes Absolute: 3.1 10*3/uL (ref 0.7–3.1)
MCH: 26.8 pg (ref 26.6–33.0)
MCHC: 32.7 g/dL (ref 31.5–35.7)
MCV: 82 fL (ref 79–97)
MONOS ABS: 0.6 10*3/uL (ref 0.1–0.9)
Monocytes: 8 %
Neutrophils Absolute: 3.2 10*3/uL (ref 1.4–7.0)
Neutrophils: 44 %
Platelets: 192 10*3/uL (ref 150–379)
RBC: 4.56 x10E6/uL (ref 4.14–5.80)
RDW: 16.2 % — ABNORMAL HIGH (ref 12.3–15.4)
WBC: 7.4 10*3/uL (ref 3.4–10.8)

## 2015-06-17 LAB — HIV ANTIBODY (ROUTINE TESTING W REFLEX): HIV Screen 4th Generation wRfx: NONREACTIVE

## 2015-06-17 LAB — PSA: Prostate Specific Ag, Serum: 2.4 ng/mL (ref 0.0–4.0)

## 2015-06-17 LAB — TSH: TSH: 5.37 u[IU]/mL — AB (ref 0.450–4.500)

## 2015-06-17 LAB — URIC ACID: Uric Acid: 6.9 mg/dL (ref 3.7–8.6)

## 2015-07-01 ENCOUNTER — Other Ambulatory Visit: Payer: Self-pay | Admitting: Nephrology

## 2015-07-01 DIAGNOSIS — N183 Chronic kidney disease, stage 3 (moderate): Secondary | ICD-10-CM

## 2015-07-08 ENCOUNTER — Ambulatory Visit
Admission: RE | Admit: 2015-07-08 | Discharge: 2015-07-08 | Disposition: A | Discharge status: Home / Self Care | Payer: PRIVATE HEALTH INSURANCE | Source: Ambulatory Visit | Attending: Nephrology | Admitting: Nephrology

## 2015-07-08 ENCOUNTER — Encounter (INDEPENDENT_AMBULATORY_CARE_PROVIDER_SITE_OTHER): Payer: Self-pay

## 2015-07-08 DIAGNOSIS — N183 Chronic kidney disease, stage 3 (moderate): Secondary | ICD-10-CM | POA: Diagnosis not present

## 2015-08-03 ENCOUNTER — Ambulatory Visit: Payer: No Typology Code available for payment source | Admitting: Cardiovascular Disease

## 2015-08-04 ENCOUNTER — Ambulatory Visit (INDEPENDENT_AMBULATORY_CARE_PROVIDER_SITE_OTHER): Payer: PRIVATE HEALTH INSURANCE | Admitting: Cardiovascular Disease

## 2015-08-04 ENCOUNTER — Encounter: Payer: Self-pay | Admitting: Cardiovascular Disease

## 2015-08-04 ENCOUNTER — Encounter (INDEPENDENT_AMBULATORY_CARE_PROVIDER_SITE_OTHER): Payer: Self-pay

## 2015-08-04 VITALS — BP 100/62 | HR 58 | Ht 72.0 in | Wt 227.5 lb

## 2015-08-04 DIAGNOSIS — E785 Hyperlipidemia, unspecified: Secondary | ICD-10-CM

## 2015-08-04 DIAGNOSIS — R0602 Shortness of breath: Secondary | ICD-10-CM

## 2015-08-04 DIAGNOSIS — I2510 Atherosclerotic heart disease of native coronary artery without angina pectoris: Secondary | ICD-10-CM

## 2015-08-04 DIAGNOSIS — I10 Essential (primary) hypertension: Secondary | ICD-10-CM | POA: Diagnosis not present

## 2015-08-04 NOTE — Assessment & Plan Note (Signed)
Blood pressure is well controlled overall and if anything is on the low side.

## 2015-08-04 NOTE — Progress Notes (Signed)
Primary care physician: Kathrine Haddock   HPI  Stuart Harvey is a 64 year old male who is here today for a followup visit. He has known history of severe three-vessel coronary artery disease diagnosed in 2008 as well as a large infrarenal abdominal aortic aneurysm. He underwent coronary artery bypass graft surgery in August 2008 at [facility]. He underwent open surgical repair of aortic aneurysm in October of 2008. No cardiac events since then.  He had an abnormal stress test in 2011. Cardiac catheterization showed patent grafts with an ejection fraction of 40%. Most recent stress test was in 2013 which showed evidence of prior inferolateral infarct with some peri-infarct ischemia and normal ejection fraction. This was compared to his previous stress testing in 2011 and basically was unchanged. Aortic aneurysm followup is done  by Dr. Lucky Cowboy.  He underwent hernia surgery in April of this year with no complications. He has been doing reasonably well with no chest pain. He does complain of worsening exertional dyspnea. He was diagnosed with chronic kidney disease and was referred to nephrology. He informed me that he was referred to urology due to an abnormality on the left kidney. During last visit, I decreased the dose of metoprolol to 12.5 mg twice daily due to bradycardia.   Allergies  Allergen Reactions  . Codeine Nausea Only  . Tetracycline Rash     Current Outpatient Prescriptions on File Prior to Visit  Medication Sig Dispense Refill  . aspirin 81 MG tablet Take 81 mg by mouth daily.    Marland Kitchen buPROPion (WELLBUTRIN SR) 200 MG 12 hr tablet Take 1 tablet (200 mg total) by mouth 2 (two) times daily. 180 tablet 3  . Cholecalciferol (D3 SUPER STRENGTH) 2000 UNITS CAPS Take by mouth daily.    . citalopram (CELEXA) 40 MG tablet Take 1 tablet (40 mg total) by mouth daily. 90 tablet 3  . levothyroxine (SYNTHROID, LEVOTHROID) 50 MCG tablet Take 1 tablet (50 mcg total) by mouth daily. 90  tablet 3  . lisinopril (PRINIVIL,ZESTRIL) 5 MG tablet Take 1 tablet (5 mg total) by mouth daily. 90 tablet 3  . metoprolol tartrate (LOPRESSOR) 25 MG tablet Take 0.5 tablets (12.5 mg total) by mouth 2 (two) times daily. 30 tablet 3  . omeprazole (PRILOSEC) 20 MG capsule Take 1 capsule (20 mg total) by mouth daily. 90 capsule 3  . rosuvastatin (CRESTOR) 40 MG tablet Take 1 tablet (40 mg total) by mouth daily. 90 tablet 3   No current facility-administered medications on file prior to visit.     Past Medical History  Diagnosis Date  . Arthritis     both knees  . History of heart attack     "silent heart attack"  . Hypertension   . Hyperlipidemia   . AAA (abdominal aortic aneurysm)     s/p surgical repair in 08/2007 by Dr. Hulda Humphrey  . Coronary artery disease     CABG at [facility] in 06/2007 for 3 vessel CAD, LIMA to LAD, SVG to RPDA, SVG to OM2 and SVG to D2.  Cath in 2011 showed patent grafts. EF 40%  . Depression   . Hernia of abdominal cavity      Past Surgical History  Procedure Laterality Date  . Exploratory laparotomy  1997  . Coronary artery bypass graft  2008  . Cardiac catheterization  2008, 2011    [facility]   . Abdominal aortic aneurysm repair  08/2007  . Abdominal aortic aneurysm repair  11/2012  [facility]  . Total knee arthroplasty Right   . Hernia repair       Family History  Problem Relation Age of Onset  . Heart disease    . Heart attack    . Heart disease Mother   . Heart attack Mother   . Hypertension Mother   . Heart attack Father   . Hypertension Father      Social History   Social History  . Marital Status: Married    Spouse Name: N/A  . Number of Children: N/A  . Years of Education: N/A   Occupational History  . Not on file.   Social History Main Topics  . Smoking status: Former Smoker -- 1.00 packs/day for 35 years    Types: Cigarettes    Quit date: 12/16/2005  . Smokeless tobacco: Current User    Types: Chew     Comment: quit 2008  . Alcohol  Use: No  . Drug Use: No  . Sexual Activity: Not on file   Other Topics Concern  . Not on file   Social History Narrative        PHYSICAL EXAM   BP 100/62 mmHg  Pulse 58  Ht 6' (1.829 m)  Wt 227 lb 8 oz (103.193 kg)  BMI 30.85 kg/m2  Constitutional: He is oriented to person, place, and time. He appears well-developed and well-nourished. No distress.  HENT: No nasal discharge.  Head: Normocephalic and atraumatic.  Eyes: Pupils are equal and round. Right eye exhibits no discharge. Left eye exhibits no discharge.  Neck: Normal range of motion. Neck supple. No JVD present. No thyromegaly present.  Cardiovascular: Bradycardic, regular rhythm, normal heart sounds and. Exam reveals no gallop and no friction rub. No murmur heard.  Pulmonary/Chest: Surgical scar from CABG.  Effort normal and breath sounds normal. No stridor. No respiratory distress. He has no wheezes. He has no rales. He exhibits no tenderness.  Abdominal: Soft. Bowel sounds are normal. He exhibits no distension. There is no tenderness. There is no rebound and no guarding. There is a surgical scar from previous aneurysm repair.  Musculoskeletal: Normal range of motion. He exhibits no edema and no tenderness.  Neurological: He is alert and oriented to person, place, and time. Coordination normal.  Skin: Skin is warm and dry. No rash noted. He is not diaphoretic. No erythema. No pallor.  Psychiatric: He has a normal mood and affect. His behavior is normal. Judgment and thought content normal.       EKG: Sinus bradycardia with left anterior fascicular block ST and T wave changes suggestive of anterolateral ischemia.  ASSESSMENT AND PLAN

## 2015-08-04 NOTE — Assessment & Plan Note (Signed)
Last lipid profile from last month showed an LDL of 80 and triglyceride of 262. He is on maximal dose rosuvastatin. I discussed with him the importance of decreasing carbohydrates and improving diet overall.

## 2015-08-04 NOTE — Patient Instructions (Signed)
Medication Instructions:  Your physician recommends that you continue on your current medications as directed. Please refer to the Current Medication list given to you today.   Labwork: none  Testing/Procedures: Your physician has requested that you have an echocardiogram. Echocardiography is a painless test that uses sound waves to create images of your heart. It provides your doctor with information about the size and shape of your heart and how well your heart's chambers and valves are working. This procedure takes approximately one hour. There are no restrictions for this procedure.    Follow-Up: Your physician recommends that you schedule a follow-up appointment in: six months with Dr. Fletcher Anon.   Any Other Special Instructions Will Be Listed Below (If Applicable).  Echocardiogram An echocardiogram, or echocardiography, uses sound waves (ultrasound) to produce an image of your heart. The echocardiogram is simple, painless, obtained within a short period of time, and offers valuable information to your health care provider. The images from an echocardiogram can provide information such as:  Evidence of coronary artery disease (CAD).  Heart size.  Heart muscle function.  Heart valve function.  Aneurysm detection.  Evidence of a past heart attack.  Fluid buildup around the heart.  Heart muscle thickening.  Assess heart valve function. LET [facility] CARE PROVIDER KNOW ABOUT:  Any allergies you have.  All medicines you are taking, including vitamins, herbs, eye drops, creams, and over-the-counter medicines.  Previous problems you or members of your family have had with the use of anesthetics.  Any blood disorders you have.  Previous surgeries you have had.  Medical conditions you have.  Possibility of pregnancy, if this applies. BEFORE THE PROCEDURE  No special preparation is needed. Eat and drink normally.  PROCEDURE   In order to produce an image of your  heart, gel will be applied to your chest and a wand-like tool (transducer) will be moved over your chest. The gel will help transmit the sound waves from the transducer. The sound waves will harmlessly bounce off your heart to allow the heart images to be captured in real-time motion. These images will then be recorded.  You may need an IV to receive a medicine that improves the quality of the pictures. AFTER THE PROCEDURE You may return to your normal schedule including diet, activities, and medicines, unless your health care provider tells you otherwise. Document Released: 10/28/2000 Document Revised: 03/17/2014 Document Reviewed: 07/08/2013 [facility] Patient Information 2015 [place], [place]. This information is not intended to replace advice given to you by your health care provider. Make sure you discuss any questions you have with your health care provider.

## 2015-08-04 NOTE — Assessment & Plan Note (Signed)
He is doing well overall with no symptoms suggestive of angina. However, he reports worsening dyspnea and thus I requested an echocardiogram. His previous ejection fraction was 40%. Continue low-dose lisinopril and metoprolol.

## 2015-08-17 ENCOUNTER — Encounter: Payer: Self-pay | Admitting: Urology

## 2015-08-17 ENCOUNTER — Ambulatory Visit (INDEPENDENT_AMBULATORY_CARE_PROVIDER_SITE_OTHER): Payer: PRIVATE HEALTH INSURANCE | Admitting: Urology

## 2015-08-17 VITALS — BP 115/84 | HR 79 | Ht 72.0 in | Wt 228.6 lb

## 2015-08-17 DIAGNOSIS — N2889 Other specified disorders of kidney and ureter: Secondary | ICD-10-CM | POA: Diagnosis not present

## 2015-08-17 NOTE — Progress Notes (Signed)
08/17/2015 3:14 PM   Stuart Harvey 04-Oct-1951 [id number]  Referring provider: Kathrine Haddock, NP [place], [place] [address]  Chief Complaint  Patient presents with  . renal mass    HPI: [name] is a 64yo seen in consultation for left renal mass. He had CKD3. He underwent CT scan in 11/2014 which showed a lower pole cystic mass on his left kidney with questionable solid component at the inferior medial aspect of the cyst mass.  He has hx of CABG and aortobifem.  He denies hematuria. He denies any LUTS. NO recent weight loss.  He does not take NSAIDS.   PMH: Past Medical History  Diagnosis Date  . Arthritis     both knees  . History of heart attack     "silent heart attack"  . Hypertension   . Hyperlipidemia   . AAA (abdominal aortic aneurysm) ([place])     s/p surgical repair in 08/2007 by Dr. Hulda Humphrey  . Coronary artery disease     CABG at [facility] in 06/2007 for 3 vessel CAD, LIMA to LAD, SVG to RPDA, SVG to OM2 and SVG to D2.  Cath in 2011 showed patent grafts. EF 40%  . Depression   . Hernia of abdominal cavity     Surgical History: Past Surgical History  Procedure Laterality Date  . Exploratory laparotomy  1997  . Coronary artery bypass graft  2008  . Cardiac catheterization  2008, 2011    [facility]   . Abdominal aortic aneurysm repair  08/2007  . Abdominal aortic aneurysm repair  11/2012    [facility]  . Total knee arthroplasty Right   . Hernia repair      Home Medications:    Medication List       This list is accurate as of: 08/17/15  3:14 PM.  Always use your most recent med list.               aspirin 81 MG tablet  Take 81 mg by mouth daily.     buPROPion 200 MG 12 hr tablet  Commonly known as:  WELLBUTRIN SR  Take 1 tablet (200 mg total) by mouth 2 (two) times daily.     citalopram 40 MG tablet  Commonly known as:  CELEXA  Take 1 tablet (40 mg total) by mouth daily.     D3 SUPER STRENGTH 2000 UNITS Caps  Generic drug:  Cholecalciferol  Take by  mouth daily.     levothyroxine 50 MCG tablet  Commonly known as:  SYNTHROID, LEVOTHROID  Take 1 tablet (50 mcg total) by mouth daily.     lisinopril 5 MG tablet  Commonly known as:  PRINIVIL,ZESTRIL  Take 1 tablet (5 mg total) by mouth daily.     metoprolol tartrate 25 MG tablet  Commonly known as:  LOPRESSOR  Take 0.5 tablets (12.5 mg total) by mouth 2 (two) times daily.     omeprazole 20 MG capsule  Commonly known as:  PRILOSEC  Take 1 capsule (20 mg total) by mouth daily.     rosuvastatin 40 MG tablet  Commonly known as:  CRESTOR  Take 1 tablet (40 mg total) by mouth daily.     sodium bicarbonate 650 MG tablet  Take 650 mg by mouth 2 (two) times daily.        Allergies:  Allergies  Allergen Reactions  . Codeine Nausea Only  . Tetracycline Rash    Family History: Family History  Problem Relation Age of  Onset  . Heart disease    . Heart attack    . Heart disease Mother   . Heart attack Mother   . Hypertension Mother   . Heart attack Father   . Hypertension Father   . Prostate cancer Neg Hx   . Kidney cancer Neg Hx     Social History:  reports that he quit smoking about 9 years ago. His smoking use included Cigarettes. He has a 35 pack-year smoking history. His smokeless tobacco use includes Chew. He reports that he does not drink alcohol or use illicit drugs.  ROS: UROLOGY Frequent Urination?: Yes Hard to postpone urination?: Yes Burning/pain with urination?: No Get up at night to urinate?: Yes Leakage of urine?: No Urine stream starts and stops?: Yes Trouble starting stream?: No Do you have to strain to urinate?: No Blood in urine?: No Urinary tract infection?: No Sexually transmitted disease?: No Injury to kidneys or bladder?: No Painful intercourse?: No Weak stream?: No Erection problems?: Yes Penile pain?: No  Gastrointestinal Nausea?: No Vomiting?: No Indigestion/heartburn?: No Diarrhea?: No Constipation?: No  Constitutional Fever:  No Night sweats?: No Weight loss?: No Fatigue?: Yes  Skin Skin rash/lesions?: No Itching?: No  Eyes Blurred vision?: No Double vision?: No  Ears/Nose/Throat Sore throat?: No Sinus problems?: No  Hematologic/Lymphatic Swollen glands?: No Easy bruising?: Yes  Cardiovascular Leg swelling?: No Chest pain?: No  Respiratory Cough?: No Shortness of breath?: Yes  Endocrine Excessive thirst?: No  Musculoskeletal Back pain?: No Joint pain?: Yes  Neurological Headaches?: No Dizziness?: No  Psychologic Depression?: No Anxiety?: No  Physical Exam: BP 115/84 mmHg  Pulse 79  Ht 6' (1.829 m)  Wt 103.692 kg (228 lb 9.6 oz)  BMI 31.00 kg/m2  Constitutional:  Alert and oriented, No acute distress. HEENT: [place] AT, moist mucus membranes.  Trachea midline, no masses. Cardiovascular: No clubbing, cyanosis, or edema. Respiratory: Normal respiratory effort, no increased work of breathing. GI: Abdomen is soft, nontender, nondistended, no abdominal masses GU: No CVA tenderness. Skin: No rashes, bruises or suspicious lesions. Lymph: No cervical or inguinal adenopathy. Neurologic: Grossly intact, no focal deficits, moving all 4 extremities. Psychiatric: Normal mood and affect.  Laboratory Data: Lab Results  Component Value Date   WBC 7.4 06/16/2015   HGB 12.2* 02/12/2015   HCT 37.3* 06/16/2015   MCV 83 02/12/2015   PLT 195 02/12/2015    Lab Results  Component Value Date   CREATININE 1.41* 06/16/2015    Lab Results  Component Value Date   PSA 2.4 06/16/2015    No results found for: TESTOSTERONE  No results found for: HGBA1C  Urinalysis    Component Value Date/Time   COLORURINE Yellow 02/20/2015 1933   APPEARANCEUR Clear 02/20/2015 1933   LABSPEC 1.014 02/20/2015 1933   PHURINE 6.0 02/20/2015 1933   GLUCOSEU Negative 02/20/2015 1933   HGBUR Negative 02/20/2015 1933   BILIRUBINUR Negative 02/20/2015 1933   KETONESUR Negative 02/20/2015 1933   PROTEINUR  Negative 02/20/2015 1933   NITRITE Negative 02/20/2015 1933   LEUKOCYTESUR Negative 02/20/2015 1933    Pertinent Imaging: Renal US and CT abd pelvis  Assessment & Plan:   1. Renal mass -MRI abd w/wo contrast -RTC after MRI  There are no diagnoses linked to this encounter.  No Follow-up on file.  Cleon Gustin, [facility] [address] [place], [place] [address] [phone]

## 2015-08-25 ENCOUNTER — Other Ambulatory Visit: Payer: Self-pay

## 2015-08-25 ENCOUNTER — Ambulatory Visit (INDEPENDENT_AMBULATORY_CARE_PROVIDER_SITE_OTHER): Payer: PRIVATE HEALTH INSURANCE

## 2015-08-25 DIAGNOSIS — R0602 Shortness of breath: Secondary | ICD-10-CM | POA: Diagnosis not present

## 2015-08-31 ENCOUNTER — Ambulatory Visit
Admission: RE | Admit: 2015-08-31 | Discharge: 2015-08-31 | Disposition: A | Discharge status: Home / Self Care | Payer: PRIVATE HEALTH INSURANCE | Source: Ambulatory Visit | Attending: Urology | Admitting: Urology

## 2015-08-31 DIAGNOSIS — N2889 Other specified disorders of kidney and ureter: Secondary | ICD-10-CM

## 2015-08-31 MED ORDER — GADOBENATE DIMEGLUMINE 529 MG/ML IV SOLN
20.0000 mL | Freq: Once | INTRAVENOUS | Status: AC | PRN
  Administered 2015-08-31: 20 mL via INTRAVENOUS

## 2015-09-01 ENCOUNTER — Telehealth: Payer: Self-pay

## 2015-09-01 NOTE — Telephone Encounter (Signed)
Pt called back regarding echo results. Reviewed results w/ pt who verbalized understanding. See echo note.

## 2015-09-03 ENCOUNTER — Telehealth: Payer: Self-pay | Admitting: Urology

## 2015-09-03 NOTE — Telephone Encounter (Signed)
Patient would like to see Dr. Alyson Ingles.  He has an appointment on 09/17/2015 to receive MRI results, but really want to see Dr. Alyson Ingles, who does not have a clinic day in [facility] for several weeks.  Would you be willing to call the patient with MRI results?

## 2015-09-07 ENCOUNTER — Telehealth: Payer: Self-pay | Admitting: Urology

## 2015-09-07 NOTE — Telephone Encounter (Signed)
I cannot advise on the results.  He will have to wait to see the physician.  Is there anyway to move up his appointment?

## 2015-09-07 NOTE — Telephone Encounter (Signed)
Pt stated that he had an MRI at the hospital about 10 days ago.  Has an upcoming appt on 11/3 but does not want to wait that long for his results.  Wants a nurse to call him with the results.  [phone] or [phone].

## 2015-09-10 ENCOUNTER — Encounter: Payer: Self-pay | Admitting: Urology

## 2015-09-10 ENCOUNTER — Ambulatory Visit (INDEPENDENT_AMBULATORY_CARE_PROVIDER_SITE_OTHER): Payer: PRIVATE HEALTH INSURANCE | Admitting: Urology

## 2015-09-10 VITALS — BP 144/87 | HR 78 | Ht 72.0 in | Wt 226.3 lb

## 2015-09-10 DIAGNOSIS — N281 Cyst of kidney, acquired: Secondary | ICD-10-CM

## 2015-09-10 DIAGNOSIS — Q6100 Congenital renal cyst, unspecified: Secondary | ICD-10-CM

## 2015-09-10 NOTE — Progress Notes (Signed)
09/10/2015 10:42 AM   Stuart Harvey February 09, 1951 [id number]  Referring provider: Kathrine Haddock, NP [name], [place] [address]  Chief Complaint  Patient presents with  . Results    MRI results    HPI: Stuart Harvey is a 64yo seen in consultation for left renal mass. He had CKD3. He underwent CT scan in 11/2014 which showed a lower pole cystic mass on his left kidney with questionable solid component at the inferior medial aspect of the cyst mass.  He has hx of CABG and aortobifem.  He denies hematuria. He denies any LUTS. NO recent weight loss. He does not take NSAIDS.  Interval History: The patient returns to discuss his MRI results. MRI shows a cyst stable in size since 2008 the per the radiologist's read is not concerning for carcinoma.   PMH: Past Medical History  Diagnosis Date  . Arthritis     both knees  . History of heart attack     "silent heart attack"  . Hypertension   . Hyperlipidemia   . AAA (abdominal aortic aneurysm) ([place])     s/p surgical repair in 08/2007 by Dr. Hulda Humphrey  . Coronary artery disease     CABG at [facility] in 06/2007 for 3 vessel CAD, LIMA to LAD, SVG to RPDA, SVG to OM2 and SVG to D2.  Cath in 2011 showed patent grafts. EF 40%  . Depression   . Hernia of abdominal cavity     Surgical History: Past Surgical History  Procedure Laterality Date  . Exploratory laparotomy  1997  . Coronary artery bypass graft  2008  . Cardiac catheterization  2008, 2011    [facility]   . Abdominal aortic aneurysm repair  08/2007  . Abdominal aortic aneurysm repair  11/2012    [facility]  . Total knee arthroplasty Right   . Hernia repair      Home Medications:    Medication List       This list is accurate as of: 09/10/15 10:42 AM.  Always use your most recent med list.               aspirin 81 MG tablet  Take 81 mg by mouth daily.     buPROPion 200 MG 12 hr tablet  Commonly known as:  WELLBUTRIN SR  Take 1 tablet (200 mg total) by mouth 2 (two) times  daily.     citalopram 40 MG tablet  Commonly known as:  CELEXA  Take 1 tablet (40 mg total) by mouth daily.     D3 SUPER STRENGTH 2000 UNITS Caps  Generic drug:  Cholecalciferol  Take by mouth daily.     levothyroxine 50 MCG tablet  Commonly known as:  SYNTHROID, LEVOTHROID  Take 1 tablet (50 mcg total) by mouth daily.     lisinopril 5 MG tablet  Commonly known as:  PRINIVIL,ZESTRIL  Take 1 tablet (5 mg total) by mouth daily.     metoprolol tartrate 25 MG tablet  Commonly known as:  LOPRESSOR  Take 0.5 tablets (12.5 mg total) by mouth 2 (two) times daily.     omeprazole 20 MG capsule  Commonly known as:  PRILOSEC  Take 1 capsule (20 mg total) by mouth daily.     rosuvastatin 40 MG tablet  Commonly known as:  CRESTOR  Take 1 tablet (40 mg total) by mouth daily.     sodium bicarbonate 650 MG tablet  Take 650 mg by mouth 2 (two) times daily.  Allergies:  Allergies  Allergen Reactions  . Codeine Nausea Only  . Tetracycline Rash    Family History: Family History  Problem Relation Age of Onset  . Heart disease    . Heart attack    . Heart disease Mother   . Heart attack Mother   . Hypertension Mother   . Heart attack Father   . Hypertension Father   . Prostate cancer Neg Hx   . Kidney cancer Neg Hx     Social History:  reports that he quit smoking about 9 years ago. His smoking use included Cigarettes. He has a 35 pack-year smoking history. His smokeless tobacco use includes Chew. He reports that he does not drink alcohol or use illicit drugs.  ROS: UROLOGY Frequent Urination?: Yes Hard to postpone urination?: Yes Burning/pain with urination?: No Get up at night to urinate?: Yes Leakage of urine?: No Urine stream starts and stops?: No Trouble starting stream?: No Do you have to strain to urinate?: No Blood in urine?: No Urinary tract infection?: No Sexually transmitted disease?: No Injury to kidneys or bladder?: No Painful intercourse?:  No Weak stream?: No Erection problems?: No Penile pain?: No  Gastrointestinal Nausea?: No Vomiting?: No Indigestion/heartburn?: No Diarrhea?: No Constipation?: No  Constitutional Fever: No Night sweats?: No Weight loss?: No Fatigue?: No  Skin Skin rash/lesions?: No Itching?: No  Eyes Blurred vision?: No Double vision?: No  Ears/Nose/Throat Sore throat?: No Sinus problems?: No  Hematologic/Lymphatic Swollen glands?: No Easy bruising?: No  Cardiovascular Leg swelling?: No Chest pain?: No  Respiratory Cough?: No Shortness of breath?: No  Endocrine Excessive thirst?: No  Musculoskeletal Back pain?: No Joint pain?: No  Neurological Headaches?: No Dizziness?: No  Psychologic Depression?: No Anxiety?: No  Physical Exam: BP 144/87 mmHg  Pulse 78  Ht 6' (1.829 m)  Wt 226 lb 4.8 oz (102.649 kg)  BMI 30.69 kg/m2  Constitutional:  Alert and oriented, No acute distress. HEENT: [place] AT, moist mucus membranes.  Trachea midline, no masses. Cardiovascular: No clubbing, cyanosis, or edema. Respiratory: Normal respiratory effort, no increased work of breathing. GI: Abdomen is soft, nontender, nondistended, no abdominal masses GU: No CVA tenderness.  Skin: No rashes, bruises or suspicious lesions. Lymph: No cervical or inguinal adenopathy. Neurologic: Grossly intact, no focal deficits, moving all 4 extremities. Psychiatric: Normal mood and affect.  Laboratory Data: Lab Results  Component Value Date   WBC 7.4 06/16/2015   HGB 12.2* 02/12/2015   HCT 37.3* 06/16/2015   MCV 83 02/12/2015   PLT 195 02/12/2015    Lab Results  Component Value Date   CREATININE 1.41* 06/16/2015    Lab Results  Component Value Date   PSA 2.4 06/16/2015    No results found for: TESTOSTERONE  No results found for: HGBA1C  Urinalysis    Component Value Date/Time   COLORURINE Yellow 02/20/2015 1933   APPEARANCEUR Clear 02/20/2015 1933   LABSPEC 1.014 02/20/2015 1933    PHURINE 6.0 02/20/2015 1933   GLUCOSEU Negative 02/20/2015 1933   HGBUR Negative 02/20/2015 1933   BILIRUBINUR Negative 02/20/2015 1933   KETONESUR Negative 02/20/2015 1933   PROTEINUR Negative 02/20/2015 1933   NITRITE Negative 02/20/2015 1933   LEUKOCYTESUR Negative 02/20/2015 1933    Pertinent Imaging:  CLINICAL DATA: Followup of left-sided renal cysts.  EXAM: MRI ABDOMEN WITHOUT AND WITH CONTRAST  TECHNIQUE: Multiplanar multisequence Stuart imaging of the abdomen was performed both before and after the administration of intravenous contrast.  CONTRAST: 59mL MULTIHANCE GADOBENATE DIMEGLUMINE 529  MG/ML IV SOLN  COMPARISON: Ultrasound of 07/08/2015. CTs of 12/13/2014 and 08/06/2007  FINDINGS: Lower chest: Normal heart size without pericardial or pleural effusion.  Hepatobiliary: Tiny T2 hyperintense liver lesions are likely cysts or bile duct hamartomas. Normal gallbladder, without biliary ductal dilatation.  Pancreas: Normal, without mass or ductal dilatation.  Spleen: Normal in size, without focal abnormality.  Adrenals/Urinary Tract: Normal adrenal glands. Bilateral renal cysts are identified. Centered within the inter and lower pole left kidney is a multi septated cystic "Mass" . innumerable thin septa a within. No enhancing solid components. On coronal image 15 of series 3, measures 9.2 x 8.7 cm. On transverse image 13 of series 4, measures 9.9 x 9.2 cm. Given cross modality comparison, grossly similar to on the 08/06/2007 CT.  No hydronephrosis.  Stomach/Bowel: Normal stomach and abdominal bowel loops.  Vascular/Lymphatic: Status post aortic stent graft repair. No surrounding hematoma. Small retroperitoneal nodes are similar.  Other: No ascites.  Musculoskeletal: No acute osseous abnormality.  IMPRESSION: 1. Multiloculated cystic mass centered in the inter and lower pole left kidney. Given cross modality comparison, grossly similar  back to 08/06/2007. Differential considerations include multilocular cystic nephroma or localized cystic disease. No solid or enhancing components to suggest complicating carcinoma. 2. No acute abdominal process.     Assessment & Plan:    1. Multiloculated cystic mass in the lower left kidney This has been stable since 2008 and per the radiologist report does not appear to be a carcinoma. Since it has not changed since 2008, I believe that it is reasonable to follow up on an annual basis to ensure it does not change in size or characteristics. He will follow-up in one year's time for renal ultrasound.    Return in about 1 year (around 09/09/2016) for with renal ultrasound prior.  Nickie Retort, MD  [facility] [address], [place], [place] [address] [phone]

## 2015-09-17 ENCOUNTER — Ambulatory Visit: Payer: PRIVATE HEALTH INSURANCE

## 2015-09-30 ENCOUNTER — Other Ambulatory Visit: Payer: Self-pay | Admitting: *Deleted

## 2015-09-30 MED ORDER — METOPROLOL TARTRATE 25 MG PO TABS: 12.5000 mg | ORAL_TABLET | Freq: Two times a day (BID) | ORAL | Status: DC

## 2015-12-01 ENCOUNTER — Encounter: Payer: Self-pay | Admitting: Physician Assistant

## 2015-12-01 ENCOUNTER — Ambulatory Visit: Payer: Self-pay | Admitting: Physician Assistant

## 2015-12-01 VITALS — BP 130/80 | HR 94 | Temp 98.8°F

## 2015-12-01 DIAGNOSIS — J209 Acute bronchitis, unspecified: Secondary | ICD-10-CM

## 2015-12-01 MED ORDER — HYDROCOD POLST-CPM POLST ER 10-8 MG/5ML PO SUER: 5.0000 mL | Freq: Two times a day (BID) | ORAL | Status: DC | PRN

## 2015-12-01 MED ORDER — LEVOFLOXACIN 750 MG PO TABS: 750.0000 mg | ORAL_TABLET | Freq: Every day | ORAL | Status: DC

## 2015-12-01 NOTE — Progress Notes (Signed)
S: C/o runny nose and congestion for 3 days, no fever, chills, cp/sob, v/d; mucus was green this am but clear throughout the day, cough is sporadic, always goes to his chest  Using otc meds: store brand mucinex and sudafed  O: PE: vitals wnl, nad, perrl eomi, normocephalic, tms dull, nasal mucosa red and swollen, throat injected, neck supple no lymph, lungs c t a, cv rrr, neuro intact, cough is dry and hacking  A:  Acute bronchitis   P: levaquin 750mg  , tussionex 11ml nr;  drink fluids, continue regular meds , use otc meds of choice, return if not improving in 5 days, return earlier if worsening

## 2015-12-18 ENCOUNTER — Encounter: Payer: Self-pay | Admitting: Unknown Physician Specialty

## 2015-12-18 ENCOUNTER — Ambulatory Visit (INDEPENDENT_AMBULATORY_CARE_PROVIDER_SITE_OTHER): Payer: Managed Care, Other (non HMO) | Admitting: Unknown Physician Specialty

## 2015-12-18 VITALS — BP 144/80 | HR 58 | Temp 97.8°F | Ht 70.7 in | Wt 228.4 lb

## 2015-12-18 DIAGNOSIS — N183 Chronic kidney disease, stage 3 unspecified: Secondary | ICD-10-CM | POA: Insufficient documentation

## 2015-12-18 DIAGNOSIS — E039 Hypothyroidism, unspecified: Secondary | ICD-10-CM

## 2015-12-18 DIAGNOSIS — N189 Chronic kidney disease, unspecified: Secondary | ICD-10-CM

## 2015-12-18 DIAGNOSIS — I2510 Atherosclerotic heart disease of native coronary artery without angina pectoris: Secondary | ICD-10-CM

## 2015-12-18 DIAGNOSIS — E785 Hyperlipidemia, unspecified: Secondary | ICD-10-CM | POA: Diagnosis not present

## 2015-12-18 DIAGNOSIS — F329 Major depressive disorder, single episode, unspecified: Secondary | ICD-10-CM

## 2015-12-18 DIAGNOSIS — R7301 Impaired fasting glucose: Secondary | ICD-10-CM | POA: Insufficient documentation

## 2015-12-18 DIAGNOSIS — F32A Depression, unspecified: Secondary | ICD-10-CM

## 2015-12-18 DIAGNOSIS — I10 Essential (primary) hypertension: Secondary | ICD-10-CM | POA: Diagnosis not present

## 2015-12-18 LAB — LIPID PANEL PICCOLO, WAIVED
Chol/HDL Ratio Piccolo,Waive: 4 mg/dL
Cholesterol Piccolo, Waived: 159 mg/dL (ref <200)
HDL CHOL PICCOLO, WAIVED: 40 mg/dL — AB (ref >59)
LDL CHOL CALC PICCOLO WAIVED: 85 mg/dL (ref <100)
Triglycerides Piccolo,Waived: 166 mg/dL — ABNORMAL HIGH (ref <150)
VLDL Chol Calc Piccolo,Waive: 33 mg/dL — ABNORMAL HIGH (ref <30)

## 2015-12-18 LAB — BAYER DCA HB A1C WAIVED: HB A1C: 6 % (ref <7.0)

## 2015-12-18 MED ORDER — OMEPRAZOLE 20 MG PO CPDR: 20.0000 mg | DELAYED_RELEASE_CAPSULE | Freq: Every day | ORAL | Status: DC

## 2015-12-18 MED ORDER — ROSUVASTATIN CALCIUM 40 MG PO TABS: 40.0000 mg | ORAL_TABLET | Freq: Every day | ORAL | Status: DC

## 2015-12-18 MED ORDER — CITALOPRAM HYDROBROMIDE 40 MG PO TABS: 40.0000 mg | ORAL_TABLET | Freq: Every day | ORAL | Status: DC

## 2015-12-18 MED ORDER — METOPROLOL TARTRATE 25 MG PO TABS: 12.5000 mg | ORAL_TABLET | Freq: Two times a day (BID) | ORAL | Status: DC

## 2015-12-18 MED ORDER — BUPROPION HCL ER (SR) 200 MG PO TB12: 200.0000 mg | ORAL_TABLET | Freq: Two times a day (BID) | ORAL | Status: DC

## 2015-12-18 MED ORDER — LEVOTHYROXINE SODIUM 50 MCG PO TABS: 50.0000 ug | ORAL_TABLET | Freq: Every day | ORAL | Status: DC

## 2015-12-18 MED ORDER — LISINOPRIL 5 MG PO TABS: 5.0000 mg | ORAL_TABLET | Freq: Every day | ORAL | Status: DC

## 2015-12-18 NOTE — Assessment & Plan Note (Signed)
Per Dr Arida 

## 2015-12-18 NOTE — Assessment & Plan Note (Signed)
Check TSH.  On 50 mcgs synthroid

## 2015-12-18 NOTE — Assessment & Plan Note (Signed)
Stable, continue present medications.   

## 2015-12-18 NOTE — Progress Notes (Signed)
BP 144/80 mmHg  Pulse 58  Temp(Src) 97.8 F (36.6 C)  Ht 5' 10.7" (1.796 m)  Wt 228 lb 6.4 oz (103.602 kg)  BMI 32.12 kg/m2  SpO2 95%   Subjective:    Patient ID: Stuart Harvey, male    DOB: [birth year], 65 y.o.   MRN: [id number]  HPI: Stuart Harvey is a 65 y.o. male  Chief Complaint  Patient presents with  . Depression  . Hypertension  . Hypothyroidism   Hypertension Using medications without difficulty Average home BPs   No problems or lightheadedness No chest pain with exertion or shortness of breath No Edema   Hyperlipidemia Using medications without problems: No Muscle aches  Diet compliance: Exercise:  Depression Stable on current medication Depression screen [facility] 2/9 12/18/2015 06/16/2015 06/16/2015  Decreased Interest 0 0 0  Down, Depressed, Hopeless 0 0 0  PHQ - 2 Score 0 0 0    CKD Seeing nephrology.     Relevant past medical, surgical, family and social history reviewed and updated as indicated. Interim medical history since our last visit reviewed. Allergies and medications reviewed and updated.  Review of Systems  Constitutional: Negative.   HENT: Negative.   Eyes: Negative.   Respiratory: Negative.   Cardiovascular: Negative.   Gastrointestinal: Negative.   Endocrine: Negative.   Genitourinary: Negative.   Skin: Negative.   Allergic/Immunologic: Negative.   Neurological: Negative.   Hematological: Negative.   Psychiatric/Behavioral: Negative.     Per HPI unless specifically indicated above     Objective:    BP 144/80 mmHg  Pulse 58  Temp(Src) 97.8 F (36.6 C)  Ht 5' 10.7" (1.796 m)  Wt 228 lb 6.4 oz (103.602 kg)  BMI 32.12 kg/m2  SpO2 95%  Wt Readings from Last 3 Encounters:  12/18/15 228 lb 6.4 oz (103.602 kg)  09/10/15 226 lb 4.8 oz (102.649 kg)  08/17/15 228 lb 9.6 oz (103.692 kg)    Physical Exam  Constitutional: He is oriented to person, place, and time. He appears well-developed and well-nourished. No  distress.  HENT:  Head: Normocephalic and atraumatic.  Eyes: Conjunctivae and lids are normal. Right eye exhibits no discharge. Left eye exhibits no discharge. No scleral icterus.  Neck: Normal range of motion. Neck supple. No JVD present. Carotid bruit is not present.  Cardiovascular: Normal rate, regular rhythm and normal heart sounds.   Pulmonary/Chest: Effort normal and breath sounds normal. No respiratory distress.  Abdominal: Normal appearance. There is no splenomegaly or hepatomegaly.  Musculoskeletal: Normal range of motion.  Neurological: He is alert and oriented to person, place, and time.  Skin: Skin is warm, dry and intact. No rash noted. No pallor.  Psychiatric: He has a normal mood and affect. His behavior is normal. Judgment and thought content normal.    Results for orders placed or performed in visit on 06/16/15  Lipid Panel Piccolo, [employer]  Result Value Ref Range   Cholesterol Piccolo, Waived 167 <200 mg/dL   HDL Chol Piccolo, Waived 35 (L) >59 mg/dL   Triglycerides Piccolo,Waived 262 (H) <150 mg/dL   Chol/HDL Ratio Piccolo,Waive 4.8 mg/dL   LDL Chol Calc Piccolo Waived 80 <100 mg/dL   VLDL Chol Calc Piccolo,Waive 52 (H) <30 mg/dL  Comprehensive metabolic panel  Result Value Ref Range   Glucose 112 (H) 65 - 99 mg/dL   BUN 28 (H) 8 - 27 mg/dL   Creatinine, Ser 1.41 (H) 0.76 - 1.27 mg/dL   GFR calc non Af Amer 53 (  L) >59 mL/min/1.73   GFR calc Af Amer 61 >59 mL/min/1.73   BUN/Creatinine Ratio 20 10 - 22   Sodium 137 134 - 144 mmol/L   Potassium 4.8 3.5 - 5.2 mmol/L   Chloride 102 97 - 108 mmol/L   CO2 17 (L) 18 - 29 mmol/L   Calcium 9.2 8.6 - 10.2 mg/dL   Total Protein 6.5 6.0 - 8.5 g/dL   Albumin 4.0 3.6 - 4.8 g/dL   Globulin, Total 2.5 1.5 - 4.5 g/dL   Albumin/Globulin Ratio 1.6 1.1 - 2.5   Bilirubin Total 0.3 0.0 - 1.2 mg/dL   Alkaline Phosphatase 113 39 - 117 IU/L   AST 22 0 - 40 IU/L   ALT 13 0 - 44 IU/L  Uric acid  Result Value Ref Range   Uric Acid  6.9 3.7 - 8.6 mg/dL  Microalbumin, Urine Waived  Result Value Ref Range   Microalb, Ur Waived 30 (H) 0 - 19 mg/L   Creatinine, Urine Waived 100 10 - 300 mg/dL   Microalb/Creat Ratio <30 <30 mg/g  PSA  Result Value Ref Range   Prostate Specific Ag, Serum 2.4 0.0 - 4.0 ng/mL  TSH  Result Value Ref Range   TSH 5.370 (H) 0.450 - 4.500 uIU/mL  HIV antibody  Result Value Ref Range   HIV Screen 4th Generation wRfx Non Reactive Non Reactive  CBC with Differential/Platelet  Result Value Ref Range   WBC 7.4 3.4 - 10.8 x10E3/uL   RBC 4.56 4.14 - 5.80 x10E6/uL   Hemoglobin 12.2 (L) 12.6 - 17.7 g/dL   Hematocrit 37.3 (L) 37.5 - 51.0 %   MCV 82 79 - 97 fL   MCH 26.8 26.6 - 33.0 pg   MCHC 32.7 31.5 - 35.7 g/dL   RDW 16.2 (H) 12.3 - 15.4 %   Platelets 192 150 - 379 x10E3/uL   Neutrophils 44 %   Lymphs 43 %   Monocytes 8 %   Eos 5 %   Basos 0 %   Neutrophils Absolute 3.2 1.4 - 7.0 x10E3/uL   Lymphocytes Absolute 3.1 0.7 - 3.1 x10E3/uL   Monocytes Absolute 0.6 0.1 - 0.9 x10E3/uL   EOS (ABSOLUTE) 0.4 0.0 - 0.4 x10E3/uL   Basophils Absolute 0.0 0.0 - 0.2 x10E3/uL   Immature Granulocytes 0 %   Immature Grans (Abs) 0.0 0.0 - 0.1 x10E3/uL      Assessment & Plan:   Problem List Items Addressed This Visit      Unprioritized   Coronary artery disease    Per Dr. Fletcher Anon      Relevant Medications   lisinopril (PRINIVIL,ZESTRIL) 5 MG tablet   metoprolol tartrate (LOPRESSOR) 25 MG tablet   rosuvastatin (CRESTOR) 40 MG tablet   Hypertension    Stable, continue present medications.        Relevant Medications   lisinopril (PRINIVIL,ZESTRIL) 5 MG tablet   metoprolol tartrate (LOPRESSOR) 25 MG tablet   rosuvastatin (CRESTOR) 40 MG tablet   Other Relevant Orders   Comprehensive metabolic panel   Hyperlipidemia - Primary   Relevant Medications   lisinopril (PRINIVIL,ZESTRIL) 5 MG tablet   metoprolol tartrate (LOPRESSOR) 25 MG tablet   rosuvastatin (CRESTOR) 40 MG tablet   Other  Relevant Orders   Lipid Panel Piccolo, Waived   Depression    Stable, continue present medications.        Relevant Medications   buPROPion (WELLBUTRIN SR) 200 MG 12 hr tablet   citalopram (CELEXA) 40 MG tablet  Chronic kidney disease    Per [place] [facility].  Those notes reviewed      Relevant Orders   Comprehensive metabolic panel   IFG (impaired fasting glucose)    Hgb A1C is 6.0%.  Discussed pre diabetes.  Discussed diet changes.  He will cut out soft drinks.        Relevant Orders   Bayer DCA Hb A1c Waived   Thyroid activity decreased    Check TSH.  On 50 mcgs synthroid      Relevant Medications   levothyroxine (SYNTHROID, LEVOTHROID) 50 MCG tablet   metoprolol tartrate (LOPRESSOR) 25 MG tablet   Other Relevant Orders   TSH       Follow up plan: Return in about 6 months (around 06/16/2016) for physical.

## 2015-12-18 NOTE — Patient Instructions (Signed)
Diabetes Mellitus and Food It is important for you to manage your blood sugar (glucose) level. Your blood glucose level can be greatly affected by what you eat. Eating healthier foods in the appropriate amounts throughout the day at about the same time each day will help you control your blood glucose level. It can also help slow or prevent worsening of your diabetes mellitus. Healthy eating may even help you improve the level of your blood pressure and reach or maintain a healthy weight.  General recommendations for healthful eating and cooking habits include:  Eating meals and snacks regularly. Avoid going long periods of time without eating to lose weight.  Eating a diet that consists mainly of plant-based foods, such as fruits, vegetables, nuts, legumes, and whole grains.  Using low-heat cooking methods, such as baking, instead of high-heat cooking methods, such as deep frying. Work with your dietitian to make sure you understand how to use the Nutrition Facts information on food labels. HOW CAN FOOD AFFECT ME? Carbohydrates Carbohydrates affect your blood glucose level more than any other type of food. Your dietitian will help you determine how many carbohydrates to eat at each meal and teach you how to count carbohydrates. Counting carbohydrates is important to keep your blood glucose at a healthy level, especially if you are using insulin or taking certain medicines for diabetes mellitus. Alcohol Alcohol can cause sudden decreases in blood glucose (hypoglycemia), especially if you use insulin or take certain medicines for diabetes mellitus. Hypoglycemia can be a life-threatening condition. Symptoms of hypoglycemia (sleepiness, dizziness, and disorientation) are similar to symptoms of having too much alcohol.  If your health care provider has given you approval to drink alcohol, do so in moderation and use the following guidelines:  Women should not have more than one drink per day, and men  should not have more than two drinks per day. One drink is equal to:  12 oz of beer.  5 oz of wine.  1 oz of hard liquor.  Do not drink on an empty stomach.  Keep yourself hydrated. Have water, diet soda, or unsweetened iced tea.  Regular soda, juice, and other mixers might contain a lot of carbohydrates and should be counted. WHAT FOODS ARE NOT RECOMMENDED? As you make food choices, it is important to remember that all foods are not the same. Some foods have fewer nutrients per serving than other foods, even though they might have the same number of calories or carbohydrates. It is difficult to get your body what it needs when you eat foods with fewer nutrients. Examples of foods that you should avoid that are high in calories and carbohydrates but low in nutrients include:  Trans fats (most processed foods list trans fats on the Nutrition Facts label).  Regular soda.  Juice.  Candy.  Sweets, such as cake, pie, doughnuts, and cookies.  Fried foods. WHAT FOODS CAN I EAT? Eat nutrient-rich foods, which will nourish your body and keep you healthy. The food you should eat also will depend on several factors, including:  The calories you need.  The medicines you take.  Your weight.  Your blood glucose level.  Your blood pressure level.  Your cholesterol level. You should eat a variety of foods, including:  Protein.  Lean cuts of meat.  Proteins low in saturated fats, such as fish, egg whites, and beans. Avoid processed meats.  Fruits and vegetables.  Fruits and vegetables that may help control blood glucose levels, such as apples, mangoes, and   yams.  Dairy products.  Choose fat-free or low-fat dairy products, such as milk, yogurt, and cheese.  Grains, bread, pasta, and rice.  Choose whole grain products, such as multigrain bread, whole oats, and brown rice. These foods may help control blood pressure.  Fats.  Foods containing healthful fats, such as nuts,  avocado, olive oil, canola oil, and fish. DOES EVERYONE WITH DIABETES MELLITUS HAVE THE SAME MEAL PLAN? Because every person with diabetes mellitus is different, there is not one meal plan that works for everyone. It is very important that you meet with a dietitian who will help you create a meal plan that is just right for you.   This information is not intended to replace advice given to you by your health care provider. Make sure you discuss any questions you have with your health care provider.   Document Released: 07/28/2005 Document Revised: 11/21/2014 Document Reviewed: 09/27/2013 Elsevier Interactive Patient Education 2016 Elsevier Inc.  

## 2015-12-18 NOTE — Assessment & Plan Note (Signed)
Per [facility].  Those notes reviewed

## 2015-12-18 NOTE — Assessment & Plan Note (Addendum)
Hgb A1C is 6.0%.  Discussed pre diabetes.  Discussed diet changes.  He will cut out soft drinks.

## 2015-12-19 LAB — COMPREHENSIVE METABOLIC PANEL
ALBUMIN: 3.8 g/dL (ref 3.6–4.8)
ALT: 11 IU/L (ref 0–44)
AST: 26 IU/L (ref 0–40)
Albumin/Globulin Ratio: 1.5 (ref 1.1–2.5)
Alkaline Phosphatase: 101 IU/L (ref 39–117)
BUN/Creatinine Ratio: 19 (ref 10–22)
BUN: 26 mg/dL (ref 8–27)
Bilirubin Total: 0.2 mg/dL (ref 0.0–1.2)
CO2: 20 mmol/L (ref 18–29)
CREATININE: 1.34 mg/dL — AB (ref 0.76–1.27)
Calcium: 9.2 mg/dL (ref 8.6–10.2)
Chloride: 99 mmol/L (ref 96–106)
GFR calc Af Amer: 64 mL/min/{1.73_m2} (ref >59)
GFR, EST NON AFRICAN AMERICAN: 56 mL/min/{1.73_m2} — AB (ref >59)
Globulin, Total: 2.6 g/dL (ref 1.5–4.5)
Glucose: 107 mg/dL — ABNORMAL HIGH (ref 65–99)
Potassium: 5 mmol/L (ref 3.5–5.2)
Sodium: 136 mmol/L (ref 134–144)
Total Protein: 6.4 g/dL (ref 6.0–8.5)

## 2015-12-19 LAB — TSH: TSH: 5.55 u[IU]/mL — ABNORMAL HIGH (ref 0.450–4.500)

## 2016-01-21 ENCOUNTER — Encounter: Payer: Self-pay | Admitting: Physician Assistant

## 2016-01-21 ENCOUNTER — Ambulatory Visit: Payer: Self-pay | Admitting: Physician Assistant

## 2016-01-21 VITALS — BP 124/72 | HR 76 | Temp 99.7°F

## 2016-01-21 DIAGNOSIS — J069 Acute upper respiratory infection, unspecified: Secondary | ICD-10-CM

## 2016-01-21 DIAGNOSIS — R05 Cough: Secondary | ICD-10-CM

## 2016-01-21 DIAGNOSIS — R059 Cough, unspecified: Secondary | ICD-10-CM

## 2016-01-21 MED ORDER — CEFDINIR 300 MG PO CAPS: 300.0000 mg | ORAL_CAPSULE | Freq: Two times a day (BID) | ORAL | Status: AC

## 2016-01-21 NOTE — Progress Notes (Signed)
S: C/o runny nose and congestion for 3 days with cough and body aches, no fever, chills, cp/sob, v/d; mucus was green this am but clear throughout the day, cough is sporadic,   Using otc meds: robitussin  O: PE: temp elevated at 99.7, perrl eomi, normocephalic, tms dull, nasal mucosa red and swollen, throat injected, neck supple no lymph, lungs c t a, cv rrr, neuro intact, flu swab neg  A:  Acute viral uri   P: omnicef 300mg  bid x 10d, drink fluids, continue regular meds , use otc meds of choice, return if not improving in 5 days, return earlier if worsening

## 2016-01-22 ENCOUNTER — Telehealth: Payer: Self-pay | Admitting: Physician Assistant

## 2016-01-22 DIAGNOSIS — J209 Acute bronchitis, unspecified: Secondary | ICD-10-CM

## 2016-01-22 MED ORDER — HYDROCOD POLST-CPM POLST ER 10-8 MG/5ML PO SUER: 5.0000 mL | Freq: Two times a day (BID) | ORAL | Status: DC | PRN

## 2016-01-22 NOTE — Telephone Encounter (Signed)
Pt called, cough is worsening, would like tussionex to help, does have a little left over from previous rx but not enough to make it through the weekend rx given for tussionex 166ml nr, pt to pick up rx

## 2016-01-22 NOTE — Telephone Encounter (Signed)
Patient was contacted and informed per Manuela Schwartz for him to take only at nite. And he will have to pick up prescription in office. Pt acknowledge understanding. Came into office around 12:35 p/u rx  Copy was made

## 2016-02-01 ENCOUNTER — Encounter: Payer: Self-pay | Admitting: Cardiovascular Disease

## 2016-02-01 ENCOUNTER — Ambulatory Visit (INDEPENDENT_AMBULATORY_CARE_PROVIDER_SITE_OTHER): Payer: Managed Care, Other (non HMO) | Admitting: Cardiovascular Disease

## 2016-02-01 VITALS — BP 90/60 | HR 62 | Ht 73.0 in | Wt 220.8 lb

## 2016-02-01 DIAGNOSIS — I10 Essential (primary) hypertension: Secondary | ICD-10-CM

## 2016-02-01 DIAGNOSIS — I2510 Atherosclerotic heart disease of native coronary artery without angina pectoris: Secondary | ICD-10-CM | POA: Diagnosis not present

## 2016-02-01 DIAGNOSIS — E785 Hyperlipidemia, unspecified: Secondary | ICD-10-CM

## 2016-02-01 NOTE — Patient Instructions (Signed)
Medication Instructions: Continue same medications.   Labwork: None.   Procedures/Testing: None.   Follow-Up: 1 year with Dr. Arida.   Any Additional Special Instructions Will Be Listed Below (If Applicable).     If you need a refill on your cardiac medications before your next appointment, please call your pharmacy.   

## 2016-02-01 NOTE — Progress Notes (Signed)
Cardiology Office Note   Date:  02/01/2016   ID:  Stuart, Harvey 1951/07/03, MRN [id number]  PCP:  Kathrine Haddock, NP  Cardiologist:   [name], MD   Chief Complaint  Patient presents with  . other    6 month follow up. Meds reviewed by the patient verbally. "doing well."       History of Present Illness: Stuart Harvey is a 65 y.o. male who presents fora followup visit regarding coronary artery disease.  He has known history of severe three-vessel coronary artery disease diagnosed in 2008 as well as a large infrarenal abdominal aortic aneurysm. He underwent coronary artery bypass graft surgery in August 2008 at [facility]. He underwent open surgical repair of aortic aneurysm in October of 2008. No cardiac events since then.  He had an abnormal stress test in 2011. Cardiac catheterization showed patent grafts with an ejection fraction of 40%. Most recent stress test was in 2013 which showed evidence of prior inferolateral infarct with some peri-infarct ischemia and normal ejection fraction. This was compared to his previous stress testing in 2011 and basically was unchanged. Aortic aneurysm followup is done  by Dr. Lucky Cowboy.  He underwent hernia surgery in April of [id number] with no complications.  He has mild chronic kidney disease which has been stable. During last visit, I decreased the dose of metoprolol to 12.5 mg once daily due to bradycardia. He has been doing well and denies chest pain. Echocardiogram in October 2016 showed an ejection fraction of 50-55% with no significant valvular abnormalities.  Past Medical History  Diagnosis Date  . Arthritis     both knees  . History of heart attack     "silent heart attack"  . Hypertension   . Hyperlipidemia   . AAA (abdominal aortic aneurysm) ([place])     s/p surgical repair in 08/2007 by Dr. Hulda Humphrey  . Coronary artery disease     CABG at [facility] in 06/2007 for 3 vessel CAD, LIMA to LAD, SVG to RPDA, SVG to  OM2 and SVG to D2.  Cath in 2011 showed patent grafts. EF 40%  . Depression   . Hernia of abdominal cavity     Past Surgical History  Procedure Laterality Date  . Exploratory laparotomy  1997  . Coronary artery bypass graft  2008  . Cardiac catheterization  2008, 2011    [facility]   . Abdominal aortic aneurysm repair  08/2007  . Abdominal aortic aneurysm repair  11/2012    [facility]  . Total knee arthroplasty Right   . Hernia repair       Current Outpatient Prescriptions  Medication Sig Dispense Refill  . aspirin 81 MG tablet Take 81 mg by mouth daily.    Marland Kitchen buPROPion (WELLBUTRIN SR) 200 MG 12 hr tablet Take 1 tablet (200 mg total) by mouth 2 (two) times daily. 180 tablet 3  . Cholecalciferol (D3 SUPER STRENGTH) 2000 UNITS CAPS Take by mouth daily.    . citalopram (CELEXA) 40 MG tablet Take 1 tablet (40 mg total) by mouth daily. 90 tablet 3  . levothyroxine (SYNTHROID, LEVOTHROID) 50 MCG tablet Take 1 tablet (50 mcg total) by mouth daily. 90 tablet 3  . lisinopril (PRINIVIL,ZESTRIL) 5 MG tablet Take 1 tablet (5 mg total) by mouth daily. 90 tablet 3  . metoprolol tartrate (LOPRESSOR) 25 MG tablet Take 0.5 tablets (12.5 mg total) by mouth 2 (two) times daily. 90 tablet 3  . omeprazole (PRILOSEC) 20  MG capsule Take 1 capsule (20 mg total) by mouth daily. 90 capsule 3  . rosuvastatin (CRESTOR) 40 MG tablet Take 1 tablet (40 mg total) by mouth daily. 90 tablet 3   No current facility-administered medications for this visit.    Allergies:   Codeine and Tetracycline    Social History:  The patient  reports that he quit smoking about 10 years ago. His smoking use included Cigarettes. He has a 35 pack-year smoking history. His smokeless tobacco use includes Chew. He reports that he does not drink alcohol or use illicit drugs.   Family History:  The patient's family history includes Depression in his maternal grandfather and maternal grandmother; Heart attack in his father and mother; Heart disease  in his mother; Hypertension in his father and mother; Kidney disease in his brother. There is no history of Prostate cancer or Kidney cancer.    ROS:  Please see the history of present illness.   Otherwise, review of systems are positive for none.   All other systems are reviewed and negative.    PHYSICAL EXAM: VS:  Ht 6\' 1"  (1.854 m)  Wt 220 lb 12 oz (100.132 kg)  BMI 29.13 kg/m2 , BMI Body mass index is 29.13 kg/(m^2). GEN: Well nourished, well developed, in no acute distress HEENT: normal Neck: no JVD, carotid bruits, or masses Cardiac: RRR; no murmurs, rubs, or gallops,no edema  Respiratory:  clear to auscultation bilaterally, normal work of breathing GI: soft, nontender, nondistended, + BS MS: no deformity or atrophy Skin: warm and dry, no rash Neuro:  Strength and sensation are intact Psych: euthymic mood, full affect   EKG:  EKG is ordered today. The ekg ordered today demonstrates normal sinus rhythm, left anterior fascicular block, ST and T wave abnormalities suggestive of anterolateral ischemia. These changes are not new.   Recent Labs: 02/12/2015: HGB 12.2* 06/16/2015: Platelets 192 12/18/2015: ALT 11; BUN 26; Creatinine, Ser 1.34*; Potassium 5.0; Sodium 136; TSH 5.550*    Lipid Panel No results found for: CHOL, TRIG, HDL, CHOLHDL, VLDL, LDLCALC, LDLDIRECT    Wt Readings from Last 3 Encounters:  02/01/16 220 lb 12 oz (100.132 kg)  12/18/15 228 lb 6.4 oz (103.602 kg)  09/10/15 226 lb 4.8 oz (102.649 kg)       ASSESSMENT AND PLAN:  1.  Coronary artery disease involving native coronary arteries without angina: He is doing well overall. Most recent ejection fraction was 50-55% on echocardiogram. He has no convincing symptoms of angina and I recommend continuing medical therapy.  2. Essential hypertension: His blood pressure is low but he does not seem to be symptomatic. I re-measured his blood pressure was 94/60. He is only on small dose metoprolol and lisinopril. I  made no changes today.  3. Hyperlipidemia: Continue treatment with high-dose rosuvastatin. Most recent lipid profile in February showed a cholesterol of 159, triglyceride of 166 and an LDL of 85. He is prediabetic which might explain his mildly elevated triglyceride.  4. Abdominal aortic aneurysm: Status post surgical repair. This is followed annually by Dr. Lucky Cowboy.   Disposition:   FU with me in 1 year  Signed,  [name], MD  02/01/2016 2:35 PM    [place]

## 2016-06-13 ENCOUNTER — Encounter (INDEPENDENT_AMBULATORY_CARE_PROVIDER_SITE_OTHER): Payer: Self-pay

## 2016-06-16 DIAGNOSIS — M25562 Pain in left knee: Secondary | ICD-10-CM | POA: Diagnosis not present

## 2016-06-16 DIAGNOSIS — M1712 Unilateral primary osteoarthritis, left knee: Secondary | ICD-10-CM | POA: Diagnosis not present

## 2016-06-20 ENCOUNTER — Encounter: Payer: Managed Care, Other (non HMO) | Admitting: Unknown Physician Specialty

## 2016-07-01 ENCOUNTER — Encounter: Payer: Self-pay | Admitting: Family Medicine

## 2016-07-01 ENCOUNTER — Encounter: Payer: Managed Care, Other (non HMO) | Admitting: Unknown Physician Specialty

## 2016-07-01 ENCOUNTER — Ambulatory Visit (INDEPENDENT_AMBULATORY_CARE_PROVIDER_SITE_OTHER): Payer: Medicare HMO | Admitting: Family Medicine

## 2016-07-01 VITALS — BP 126/82 | HR 73 | Temp 98.5°F | Ht 71.25 in | Wt 213.0 lb

## 2016-07-01 DIAGNOSIS — N183 Chronic kidney disease, stage 3 unspecified: Secondary | ICD-10-CM

## 2016-07-01 DIAGNOSIS — R7301 Impaired fasting glucose: Secondary | ICD-10-CM

## 2016-07-01 DIAGNOSIS — Z23 Encounter for immunization: Secondary | ICD-10-CM | POA: Diagnosis not present

## 2016-07-01 DIAGNOSIS — I2510 Atherosclerotic heart disease of native coronary artery without angina pectoris: Secondary | ICD-10-CM

## 2016-07-01 DIAGNOSIS — Z0000 Encounter for general adult medical examination without abnormal findings: Secondary | ICD-10-CM

## 2016-07-01 DIAGNOSIS — E785 Hyperlipidemia, unspecified: Secondary | ICD-10-CM | POA: Diagnosis not present

## 2016-07-01 DIAGNOSIS — E039 Hypothyroidism, unspecified: Secondary | ICD-10-CM | POA: Diagnosis not present

## 2016-07-01 DIAGNOSIS — Z125 Encounter for screening for malignant neoplasm of prostate: Secondary | ICD-10-CM | POA: Diagnosis not present

## 2016-07-01 DIAGNOSIS — R69 Illness, unspecified: Secondary | ICD-10-CM | POA: Diagnosis not present

## 2016-07-01 DIAGNOSIS — F32A Depression, unspecified: Secondary | ICD-10-CM

## 2016-07-01 DIAGNOSIS — F329 Major depressive disorder, single episode, unspecified: Secondary | ICD-10-CM | POA: Diagnosis not present

## 2016-07-01 DIAGNOSIS — I10 Essential (primary) hypertension: Secondary | ICD-10-CM | POA: Diagnosis not present

## 2016-07-01 LAB — UA/M W/RFLX CULTURE, ROUTINE
Bilirubin, UA: NEGATIVE
GLUCOSE, UA: NEGATIVE
KETONES UA: NEGATIVE
LEUKOCYTES UA: NEGATIVE
Nitrite, UA: NEGATIVE
Protein, UA: NEGATIVE
RBC UA: NEGATIVE
SPEC GRAV UA: 1.02 (ref 1.005–1.030)
UUROB: 0.2 mg/dL (ref 0.2–1.0)
pH, UA: 5.5 (ref 5.0–7.5)

## 2016-07-01 NOTE — Progress Notes (Signed)
Subjective:   Stuart Harvey is a 65 y.o. male who presents for a Welcome to Medicare exam.   HTN - BPs doing well, checks occasionally at home and getting in 120s/80s. No LE edema, CP, lightheadedness CAD - Followed by Dr. Fletcher Anon, no CP or SOB.  Hyperlipidemia - Stable, no myalgias. Eating a good diet and very active outdoors.  Hypothyroidism - No fatigue, constipation, cold intolerance, or other symptoms noted Depression - Doing well, no dysphoric moods reported.  IFG - Wife has started cooking much more frequently and they have been making a big effort to eat well  Review of Systems: Constitutional: Negative.   HENT: Negative.   Eyes: Negative.   Respiratory: Negative.   Cardiovascular: Negative.   Gastrointestinal: Negative.   Endocrine: Negative.   Genitourinary: Negative.   Skin: Negative.   Allergic/Immunologic: Negative.   Neurological: Negative.   Hematological: Negative.   Psychiatric/Behavioral: Negative.         Objective:    Today's Vitals   07/01/16 1305  BP: 126/82  Pulse: 73  Temp: 98.5 F (36.9 C)  Weight: 213 lb (96.6 kg)  Height: 5' 11.25" (1.81 m)   Body mass index is 29.5 kg/m.  Medications Outpatient Encounter Prescriptions as of 07/01/2016  Medication Sig  . aspirin 81 MG tablet Take 81 mg by mouth daily.  Marland Kitchen buPROPion (WELLBUTRIN SR) 200 MG 12 hr tablet Take 1 tablet (200 mg total) by mouth 2 (two) times daily.  . Cholecalciferol (D3 SUPER STRENGTH) 2000 UNITS CAPS Take by mouth daily.  . citalopram (CELEXA) 40 MG tablet Take 1 tablet (40 mg total) by mouth daily.  Marland Kitchen levothyroxine (SYNTHROID, LEVOTHROID) 50 MCG tablet Take 1 tablet (50 mcg total) by mouth daily.  Marland Kitchen lisinopril (PRINIVIL,ZESTRIL) 5 MG tablet Take 1 tablet (5 mg total) by mouth daily.  . metoprolol tartrate (LOPRESSOR) 25 MG tablet Take 0.5 tablets (12.5 mg total) by mouth 2 (two) times daily.  Marland Kitchen omeprazole (PRILOSEC) 20 MG capsule Take 1 capsule (20 mg total) by mouth  daily.  . rosuvastatin (CRESTOR) 40 MG tablet Take 1 tablet (40 mg total) by mouth daily.   No facility-administered encounter medications on file as of 07/01/2016.      History: Past Medical History:  Diagnosis Date  . AAA (abdominal aortic aneurysm) ([place])    s/p surgical repair in 08/2007 by Dr. Hulda Humphrey  . Arthritis    both knees  . Coronary artery disease    CABG at [facility] in 06/2007 for 3 vessel CAD, LIMA to LAD, SVG to RPDA, SVG to OM2 and SVG to D2.  Cath in 2011 showed patent grafts. EF 40%  . Depression   . Hernia of abdominal cavity   . History of heart attack    "silent heart attack"  . Hyperlipidemia   . Hypertension    Past Surgical History:  Procedure Laterality Date  . ABDOMINAL AORTIC ANEURYSM REPAIR  08/2007  . ABDOMINAL AORTIC ANEURYSM REPAIR  11/2012   [facility]  . CARDIAC CATHETERIZATION  2008, 2011   [facility]   . CORONARY ARTERY BYPASS GRAFT  2008  . EXPLORATORY LAPAROTOMY  1997  . HERNIA REPAIR    . TOTAL KNEE ARTHROPLASTY Right     Family History  Problem Relation Age of Onset  . Heart disease Mother   . Heart attack Mother   . Hypertension Mother   . Heart attack Father   . Hypertension Father   . Kidney disease Brother   .  Depression Maternal Grandmother   . Depression Maternal Grandfather   . Heart disease    . Heart attack    . Prostate cancer Neg Hx   . Kidney cancer Neg Hx    Social History   Occupational History  . Not on file.   Social History Main Topics  . Smoking status: Former Smoker    Packs/day: 1.00    Years: 35.00    Types: Cigarettes    Quit date: 12/16/2005  . Smokeless tobacco: Current User    Types: Chew     Comment: quit 2008  . Alcohol use No  . Drug use: No  . Sexual activity: Not on file   Tobacco Counseling Ready to quit: Yes Counseling given: Not Answered   Immunizations and Health Maintenance Immunization History  Administered Date(s) Administered  . Pneumococcal Polysaccharide-23 10/07/2009  . Tdap 06/16/2015   . Zoster 06/16/2015   Health Maintenance Due  Topic Date Due  . INFLUENZA VACCINE  06/14/2016  . PNA vac Low Risk Adult (1 of 2 - PCV13) 06/27/2016    Activities of Daily Living In your present state of health, do you have any difficulty performing the following activities: 07/01/2016  Hearing? N  Vision? N  Difficulty concentrating or making decisions? Y  Walking or climbing stairs? Y  Dressing or bathing? N  Doing errands, shopping? N  Some recent data might be hidden    Physical Exam   Constitutional: He is oriented to person, place, and time. He appears well-developed and well-nourished. No distress.  HENT: PERRL, B/l TMs benign, oropharynx clear and moist without exudates Head: Normocephalic and atraumatic.  Eyes: Conjunctivae and lids are normal. Right eye exhibits no discharge. Left eye exhibits no discharge. No scleral icterus.  Neck: Normal range of motion. Neck supple. No JVD present. Carotid bruit is not present.  Cardiovascular: Normal rate, regular rhythm and normal heart sounds.   Pulmonary/Chest: Effort normal and breath sounds normal. No respiratory distress.  Abdominal: Normal appearance. BS normoactive. Non-tender, no mass appreciated Musculoskeletal: Normal range of motion. Distal pulses full and equal b/l Neurological: He is alert and oriented to person, place, and time. DTRs equal b/l Skin: Skin is warm, dry and intact. No rash noted. No pallor.  Psychiatric: He has a normal mood and affect. His behavior is normal. Judgment and thought content normal.  Advanced Directives: Yes      Assessment:    This is a routine wellness  examination for this patient . Chronic conditions are all stable and under good control.    Vision/Hearing screen No exam data present  Due for yearly eye exam. Patient will schedule.   Dietary issues and exercise activities discussed: Continue daily physical activity and improving diet to include mostly fresh, whole foods.      Goals    None      Depression Screen PHQ 2/9 Scores 07/01/2016 12/18/2015 06/16/2015 06/16/2015  PHQ - 2 Score 0 0 0 0     Fall Risk Fall Risk  07/01/2016  Falls in the past year? No    MMSE: No flowsheet data found.  Patient Care Team: Kathrine Haddock, NP as PCP - General (Nurse Practitioner) Kathrine Haddock, NP as PCP - Family Medicine (Nurse Practitioner) Dereck Leep, MD (Orthopedic Surgery)     Plan:    Refilled medications today, await lab results. Prevnar administered.  During the course of the visit,Stuart Harvey was educated and counseled about the following appropriate screening and preventive services:  Vaccines to include Pneumoccal, Influenza, Hepatitis B, Td, Zostavax, HCV  Electrocardiogram  Cardiovascular Disease  Colorectal cancer screening  Diabetes screening  Glaucoma screening  Nutrition counseling  Prostate cancer screening  Smoking cessation counseling  His current medications and allergies were reviewed and needed refills of his chronic medications were ordered. The plan for yearly health maintenance was discussed all orders and referrals were made as appropriate.  Patient Instructions (the written plan) was given to the patient.   Volney American, [place] 07/01/2016

## 2016-07-02 LAB — CBC WITH DIFFERENTIAL/PLATELET
Basophils Absolute: 0 10*3/uL (ref 0.0–0.2)
Basos: 0 %
EOS (ABSOLUTE): 0.3 10*3/uL (ref 0.0–0.4)
EOS: 4 %
HEMATOCRIT: 39.1 % (ref 37.5–51.0)
HEMOGLOBIN: 12.2 g/dL — AB (ref 12.6–17.7)
IMMATURE GRANS (ABS): 0 10*3/uL (ref 0.0–0.1)
IMMATURE GRANULOCYTES: 0 %
LYMPHS ABS: 3.4 10*3/uL — AB (ref 0.7–3.1)
LYMPHS: 40 %
MCH: 26.7 pg (ref 26.6–33.0)
MCHC: 31.2 g/dL — AB (ref 31.5–35.7)
MCV: 86 fL (ref 79–97)
MONOCYTES: 7 %
Monocytes Absolute: 0.6 10*3/uL (ref 0.1–0.9)
Neutrophils Absolute: 4.2 10*3/uL (ref 1.4–7.0)
Neutrophils: 49 %
Platelets: 204 10*3/uL (ref 150–379)
RBC: 4.57 x10E6/uL (ref 4.14–5.80)
RDW: 16.6 % — ABNORMAL HIGH (ref 12.3–15.4)
WBC: 8.6 10*3/uL (ref 3.4–10.8)

## 2016-07-02 LAB — COMPREHENSIVE METABOLIC PANEL
ALBUMIN: 4.4 g/dL (ref 3.6–4.8)
ALT: 11 IU/L (ref 0–44)
AST: 26 IU/L (ref 0–40)
Albumin/Globulin Ratio: 1.6 (ref 1.2–2.2)
Alkaline Phosphatase: 103 IU/L (ref 39–117)
BUN / CREAT RATIO: 18 (ref 10–24)
BUN: 29 mg/dL — AB (ref 8–27)
Bilirubin Total: 0.6 mg/dL (ref 0.0–1.2)
CALCIUM: 9.6 mg/dL (ref 8.6–10.2)
CO2: 21 mmol/L (ref 18–29)
Chloride: 103 mmol/L (ref 96–106)
Creatinine, Ser: 1.63 mg/dL — ABNORMAL HIGH (ref 0.76–1.27)
GFR, EST AFRICAN AMERICAN: 50 mL/min/{1.73_m2} — AB (ref >59)
GFR, EST NON AFRICAN AMERICAN: 44 mL/min/{1.73_m2} — AB (ref >59)
GLOBULIN, TOTAL: 2.8 g/dL (ref 1.5–4.5)
GLUCOSE: 71 mg/dL (ref 65–99)
Potassium: 4.8 mmol/L (ref 3.5–5.2)
SODIUM: 141 mmol/L (ref 134–144)
TOTAL PROTEIN: 7.2 g/dL (ref 6.0–8.5)

## 2016-07-02 LAB — LIPID PANEL W/O CHOL/HDL RATIO
Cholesterol, Total: 154 mg/dL (ref 100–199)
HDL: 47 mg/dL (ref >39)
LDL CALC: 82 mg/dL (ref 0–99)
Triglycerides: 126 mg/dL (ref 0–149)
VLDL CHOLESTEROL CAL: 25 mg/dL (ref 5–40)

## 2016-07-02 LAB — HEMOGLOBIN A1C
ESTIMATED AVERAGE GLUCOSE: 117 mg/dL
Hgb A1c MFr Bld: 5.7 % — ABNORMAL HIGH (ref 4.8–5.6)

## 2016-07-02 LAB — TSH: TSH: 2.9 u[IU]/mL (ref 0.450–4.500)

## 2016-07-02 LAB — PSA: PROSTATE SPECIFIC AG, SERUM: 1.9 ng/mL (ref 0.0–4.0)

## 2016-07-04 ENCOUNTER — Telehealth: Payer: Self-pay | Admitting: Family Medicine

## 2016-07-04 NOTE — Telephone Encounter (Signed)
Please call patient and let him know that all of his labs came back stable, aside from worsening kidney function. Looking back at his Nephrology notes, it appears that he was to have a 6 month f/u in May of this year and I don't see documentation of that so please ask him when he last saw them - if not since November, encourage him to make that requested follow up to check on his kidneys. Continued avoidance of NSAIDs. Thanks

## 2016-07-05 ENCOUNTER — Telehealth: Payer: Self-pay

## 2016-07-05 NOTE — Telephone Encounter (Signed)
Patient notified, he has not been back to nephrology since Nov. Advised patient to call and schedule a follow up. He understood and agreed. Also advised to avoid NSAIDs.

## 2016-07-05 NOTE — Telephone Encounter (Signed)
Left message to call.

## 2016-07-05 NOTE — Telephone Encounter (Signed)
Patient called back and stated that he got an appointment with nephrology in Oct. He asked that we fax his labs over to their office. Labs faxed.

## 2016-07-28 DIAGNOSIS — M1712 Unilateral primary osteoarthritis, left knee: Secondary | ICD-10-CM | POA: Diagnosis not present

## 2016-08-09 ENCOUNTER — Telehealth: Payer: Self-pay | Admitting: Cardiovascular Disease

## 2016-08-09 NOTE — Telephone Encounter (Signed)
Cardiac clearance from PAT for 11/6 left total knee replacement with Dr. Marry Guan placed in MD basket for review

## 2016-08-16 NOTE — Telephone Encounter (Signed)
Faxed cardiac clearance to PAT, [phone] for left TKR

## 2016-09-02 DIAGNOSIS — I129 Hypertensive chronic kidney disease with stage 1 through stage 4 chronic kidney disease, or unspecified chronic kidney disease: Secondary | ICD-10-CM | POA: Diagnosis not present

## 2016-09-02 DIAGNOSIS — N183 Chronic kidney disease, stage 3 (moderate): Secondary | ICD-10-CM | POA: Diagnosis not present

## 2016-09-02 DIAGNOSIS — N2889 Other specified disorders of kidney and ureter: Secondary | ICD-10-CM | POA: Diagnosis not present

## 2016-09-02 DIAGNOSIS — R809 Proteinuria, unspecified: Secondary | ICD-10-CM | POA: Diagnosis not present

## 2016-09-06 ENCOUNTER — Encounter
Admission: RE | Admit: 2016-09-06 | Discharge: 2016-09-06 | Disposition: A | Discharge status: Home / Self Care | Payer: Medicare HMO | Source: Ambulatory Visit | Attending: Orthopedic Surgery | Admitting: Orthopedic Surgery

## 2016-09-06 DIAGNOSIS — Z01818 Encounter for other preprocedural examination: Secondary | ICD-10-CM | POA: Diagnosis not present

## 2016-09-06 HISTORY — DX: Hypothyroidism, unspecified: E03.9

## 2016-09-06 LAB — CBC
HEMATOCRIT: 39.3 % — AB (ref 40.0–52.0)
HEMOGLOBIN: 12.8 g/dL — AB (ref 13.0–18.0)
MCH: 27.7 pg (ref 26.0–34.0)
MCHC: 32.4 g/dL (ref 32.0–36.0)
MCV: 85.4 fL (ref 80.0–100.0)
Platelets: 179 10*3/uL (ref 150–440)
RBC: 4.61 MIL/uL (ref 4.40–5.90)
RDW: 16.1 % — AB (ref 11.5–14.5)
WBC: 7.3 10*3/uL (ref 3.8–10.6)

## 2016-09-06 LAB — C-REACTIVE PROTEIN

## 2016-09-06 LAB — APTT: APTT: 32 s (ref 24–36)

## 2016-09-06 LAB — SURGICAL PCR SCREEN
MRSA, PCR: NEGATIVE
STAPHYLOCOCCUS AUREUS: POSITIVE — AB

## 2016-09-06 LAB — COMPREHENSIVE METABOLIC PANEL
ALBUMIN: 4.1 g/dL (ref 3.5–5.0)
ALT: 16 U/L — AB (ref 17–63)
AST: 29 U/L (ref 15–41)
Alkaline Phosphatase: 85 U/L (ref 38–126)
Anion gap: 7 (ref 5–15)
BILIRUBIN TOTAL: 0.4 mg/dL (ref 0.3–1.2)
BUN: 32 mg/dL — AB (ref 6–20)
CO2: 24 mmol/L (ref 22–32)
CREATININE: 1.28 mg/dL — AB (ref 0.61–1.24)
Calcium: 9.3 mg/dL (ref 8.9–10.3)
Chloride: 107 mmol/L (ref 101–111)
GFR calc Af Amer: >60 mL/min (ref >60)
GFR, EST NON AFRICAN AMERICAN: 57 mL/min — AB (ref >60)
GLUCOSE: 99 mg/dL (ref 65–99)
POTASSIUM: 4.3 mmol/L (ref 3.5–5.1)
Sodium: 138 mmol/L (ref 135–145)
TOTAL PROTEIN: 7.2 g/dL (ref 6.5–8.1)

## 2016-09-06 LAB — URINALYSIS COMPLETE WITH MICROSCOPIC (ARMC ONLY)
BILIRUBIN URINE: NEGATIVE
Bacteria, UA: NONE SEEN
GLUCOSE, UA: NEGATIVE mg/dL
HGB URINE DIPSTICK: NEGATIVE
Ketones, ur: NEGATIVE mg/dL
LEUKOCYTES UA: NEGATIVE
NITRITE: NEGATIVE
PH: 5 (ref 5.0–8.0)
Protein, ur: NEGATIVE mg/dL
RBC / HPF: NONE SEEN RBC/hpf (ref 0–5)
SPECIFIC GRAVITY, URINE: 1.016 (ref 1.005–1.030)
Squamous Epithelial / LPF: NONE SEEN
WBC UA: NONE SEEN WBC/hpf (ref 0–5)

## 2016-09-06 LAB — TYPE AND SCREEN
ABO/RH(D): O NEG
ANTIBODY SCREEN: NEGATIVE

## 2016-09-06 LAB — PROTIME-INR
INR: 0.97
PROTHROMBIN TIME: 12.9 s (ref 11.4–15.2)

## 2016-09-06 LAB — SEDIMENTATION RATE: SED RATE: 7 mm/h (ref 0–20)

## 2016-09-06 NOTE — Patient Instructions (Signed)
Your procedure is scheduled on: 09/19/16 Report to Day Surgery. 2nd floor medical mall entrance To find out your arrival time please call [phone] between 1PM - 3PM on 09/16/16.  Remember: Instructions that are not followed completely may result in serious medical risk, up to and including death, or upon the discretion of your surgeon and anesthesiologist your surgery may need to be rescheduled.    __X__ 1. Do not eat food or drink liquids after midnight. No gum chewing or hard candies.     __X__ 2. No Alcohol for 24 hours before or after surgery.   ____ 3. Bring all medications with you on the day of surgery if instructed.    __X__ 4. Notify your doctor if there is any change in your medical condition     (cold, fever, infections).     Do not wear jewelry, make-up, hairpins, clips or nail polish.  Do not wear lotions, powders, or perfumes.   Do not shave 48 hours prior to surgery. Men may shave face and neck.  Do not bring valuables to the hospital.    [facility] is not responsible for any belongings or valuables.               Contacts, dentures or bridgework may not be worn into surgery.  Leave your suitcase in the car. After surgery it may be brought to your room.  For patients admitted to the hospital, discharge time is determined by your                treatment team.   Patients discharged the day of surgery will not be allowed to drive home.   Please read over the following fact sheets that you were given:   Pain Booklet and MRSA Information   __x__ Take these medicines the morning of surgery with A SIP OF WATER:    1. bupropion  2. citalopram  3. levothyroxine  4. lisinopril  5. metoprolol  6. Omeprazole (and take extra dose at bedtime night before surgery)  7. rosuvastatin  ____ Fleet Enema (as directed)   __x__ Use CHG Soap as directed  ____ Use inhalers on the day of surgery  ____ Stop metformin 2 days prior to surgery    ____ Take 1/2 of usual insulin  dose the night before surgery and none on the morning of surgery.   __x__ Stop Coumadin/Plavix/aspirin on 7 days prior to surgery if ok with your doctor  ____ Stop Anti-inflammatories on    ____ Stop supplements until after surgery.    ____ Bring C-Pap to the hospital.

## 2016-09-06 NOTE — Pre-Procedure Instructions (Signed)
Cardiac clearance on chart. 

## 2016-09-07 LAB — URINE CULTURE
Culture: NO GROWTH
Special Requests: NORMAL

## 2016-09-07 NOTE — Pre-Procedure Instructions (Signed)
Met B and CBC results sent to Dr. Marry Guan and Anesthesia for review.  Also sent Positive staph aureus results to Dr. Marry Guan.  Asked if wanted any treatment?

## 2016-09-09 ENCOUNTER — Ambulatory Visit: Payer: PRIVATE HEALTH INSURANCE

## 2016-09-14 HISTORY — PX: TOTAL KNEE ARTHROPLASTY: SHX125

## 2016-09-16 ENCOUNTER — Ambulatory Visit: Payer: Medicare HMO | Admitting: Urology

## 2016-09-16 ENCOUNTER — Telehealth: Payer: Self-pay | Admitting: Urology

## 2016-09-16 NOTE — Telephone Encounter (Signed)
Pt is having knee replacement surgery on Monday.  He is going to call scheduling for renal U/S and will call back her for appt after 1st of the year.

## 2016-09-18 MED ORDER — TRANEXAMIC ACID 1000 MG/10ML IV SOLN
1000.0000 mg | INTRAVENOUS | Status: AC
  Administered 2016-09-19: 1000 mg via INTRAVENOUS
  Filled 2016-09-18: qty 10

## 2016-09-18 MED ORDER — CEFAZOLIN SODIUM-DEXTROSE 2-4 GM/100ML-% IV SOLN
2.0000 g | INTRAVENOUS | Status: AC
  Administered 2016-09-19: 2 g via INTRAVENOUS

## 2016-09-19 ENCOUNTER — Inpatient Hospital Stay: Payer: Medicare HMO | Admitting: Anesthesiology

## 2016-09-19 ENCOUNTER — Inpatient Hospital Stay
Admission: RE | Admit: 2016-09-19 | Discharge: 2016-09-21 | DRG: 470 | Disposition: A | Discharge status: Home Health | Payer: Medicare HMO | Source: Ambulatory Visit | Attending: Orthopedic Surgery | Admitting: Orthopedic Surgery

## 2016-09-19 ENCOUNTER — Encounter
Admission: RE | Disposition: A | Discharge status: Home Health | Payer: Self-pay | Source: Ambulatory Visit | Attending: Orthopedic Surgery

## 2016-09-19 ENCOUNTER — Encounter: Payer: Self-pay | Admitting: *Deleted

## 2016-09-19 ENCOUNTER — Inpatient Hospital Stay: Payer: Medicare HMO

## 2016-09-19 DIAGNOSIS — I252 Old myocardial infarction: Secondary | ICD-10-CM | POA: Diagnosis not present

## 2016-09-19 DIAGNOSIS — E039 Hypothyroidism, unspecified: Secondary | ICD-10-CM | POA: Diagnosis present

## 2016-09-19 DIAGNOSIS — M25662 Stiffness of left knee, not elsewhere classified: Secondary | ICD-10-CM

## 2016-09-19 DIAGNOSIS — E785 Hyperlipidemia, unspecified: Secondary | ICD-10-CM | POA: Diagnosis present

## 2016-09-19 DIAGNOSIS — Z471 Aftercare following joint replacement surgery: Secondary | ICD-10-CM | POA: Diagnosis not present

## 2016-09-19 DIAGNOSIS — F329 Major depressive disorder, single episode, unspecified: Secondary | ICD-10-CM | POA: Diagnosis present

## 2016-09-19 DIAGNOSIS — M1712 Unilateral primary osteoarthritis, left knee: Secondary | ICD-10-CM | POA: Diagnosis not present

## 2016-09-19 DIAGNOSIS — I129 Hypertensive chronic kidney disease with stage 1 through stage 4 chronic kidney disease, or unspecified chronic kidney disease: Secondary | ICD-10-CM | POA: Diagnosis present

## 2016-09-19 DIAGNOSIS — I2510 Atherosclerotic heart disease of native coronary artery without angina pectoris: Secondary | ICD-10-CM | POA: Diagnosis present

## 2016-09-19 DIAGNOSIS — Z951 Presence of aortocoronary bypass graft: Secondary | ICD-10-CM | POA: Diagnosis not present

## 2016-09-19 DIAGNOSIS — N189 Chronic kidney disease, unspecified: Secondary | ICD-10-CM | POA: Diagnosis present

## 2016-09-19 DIAGNOSIS — R69 Illness, unspecified: Secondary | ICD-10-CM | POA: Diagnosis not present

## 2016-09-19 DIAGNOSIS — Z96652 Presence of left artificial knee joint: Secondary | ICD-10-CM | POA: Diagnosis not present

## 2016-09-19 DIAGNOSIS — M6281 Muscle weakness (generalized): Secondary | ICD-10-CM

## 2016-09-19 DIAGNOSIS — Z96659 Presence of unspecified artificial knee joint: Secondary | ICD-10-CM

## 2016-09-19 HISTORY — PX: KNEE ARTHROPLASTY: SHX992

## 2016-09-19 SURGERY — ARTHROPLASTY, KNEE, TOTAL, USING IMAGELESS COMPUTER-ASSISTED NAVIGATION
Anesthesia: Spinal | Site: Knee | Laterality: Left | Wound class: Clean

## 2016-09-19 MED ORDER — SENNOSIDES-DOCUSATE SODIUM 8.6-50 MG PO TABS
1.0000 | ORAL_TABLET | Freq: Two times a day (BID) | ORAL | Status: DC
  Administered 2016-09-19 – 2016-09-21 (×5): 1 via ORAL
  Filled 2016-09-19 (×5): qty 1

## 2016-09-19 MED ORDER — SODIUM CHLORIDE 0.9 % IV SOLN
INTRAVENOUS | Status: DC | PRN
  Administered 2016-09-19: 5 ug/kg/min via INTRAVENOUS

## 2016-09-19 MED ORDER — NEOMYCIN-POLYMYXIN B GU 40-200000 IR SOLN
Status: DC | PRN
  Administered 2016-09-19: 14 mL

## 2016-09-19 MED ORDER — CHLORHEXIDINE GLUCONATE 4 % EX LIQD: 60.0000 mL | Freq: Once | CUTANEOUS | Status: DC

## 2016-09-19 MED ORDER — ACETAMINOPHEN 650 MG RE SUPP: 650.0000 mg | Freq: Four times a day (QID) | RECTAL | Status: DC | PRN

## 2016-09-19 MED ORDER — ENOXAPARIN SODIUM 30 MG/0.3ML ~~LOC~~ SOLN
30.0000 mg | Freq: Two times a day (BID) | SUBCUTANEOUS | Status: DC
  Administered 2016-09-19 – 2016-09-21 (×4): 30 mg via SUBCUTANEOUS
  Filled 2016-09-19 (×3): qty 0.3

## 2016-09-19 MED ORDER — ALUM & MAG HYDROXIDE-SIMETH 200-200-20 MG/5ML PO SUSP: 30.0000 mL | ORAL | Status: DC | PRN

## 2016-09-19 MED ORDER — ONDANSETRON HCL 4 MG/2ML IJ SOLN: 4.0000 mg | Freq: Four times a day (QID) | INTRAMUSCULAR | Status: DC | PRN

## 2016-09-19 MED ORDER — PROPOFOL 500 MG/50ML IV EMUL
INTRAVENOUS | Status: DC | PRN
  Administered 2016-09-19: 50 ug/kg/min via INTRAVENOUS

## 2016-09-19 MED ORDER — METOPROLOL TARTRATE 25 MG PO TABS
12.5000 mg | ORAL_TABLET | Freq: Two times a day (BID) | ORAL | Status: DC
  Administered 2016-09-20: 12.5 mg via ORAL
  Filled 2016-09-19 (×2): qty 1

## 2016-09-19 MED ORDER — LEVOTHYROXINE SODIUM 50 MCG PO TABS
50.0000 ug | ORAL_TABLET | Freq: Every day | ORAL | Status: DC
  Administered 2016-09-20 – 2016-09-21 (×2): 50 ug via ORAL
  Filled 2016-09-19 (×2): qty 1

## 2016-09-19 MED ORDER — PANTOPRAZOLE SODIUM 40 MG PO TBEC
40.0000 mg | DELAYED_RELEASE_TABLET | Freq: Two times a day (BID) | ORAL | Status: DC
  Administered 2016-09-19 – 2016-09-21 (×5): 40 mg via ORAL
  Filled 2016-09-19 (×5): qty 1

## 2016-09-19 MED ORDER — METOCLOPRAMIDE HCL 10 MG PO TABS
10.0000 mg | ORAL_TABLET | Freq: Three times a day (TID) | ORAL | Status: DC
  Administered 2016-09-19 – 2016-09-21 (×7): 10 mg via ORAL
  Filled 2016-09-19 (×7): qty 1

## 2016-09-19 MED ORDER — BUPIVACAINE LIPOSOME 1.3 % IJ SUSP
INTRAMUSCULAR | Status: AC
  Filled 2016-09-19: qty 20

## 2016-09-19 MED ORDER — CELECOXIB 200 MG PO CAPS
200.0000 mg | ORAL_CAPSULE | Freq: Two times a day (BID) | ORAL | Status: DC
  Administered 2016-09-19 – 2016-09-21 (×4): 200 mg via ORAL
  Filled 2016-09-19 (×5): qty 1

## 2016-09-19 MED ORDER — GLYCOPYRROLATE 0.2 MG/ML IJ SOLN
INTRAMUSCULAR | Status: DC | PRN
  Administered 2016-09-19: 0.2 mg via INTRAVENOUS

## 2016-09-19 MED ORDER — MENTHOL 3 MG MT LOZG
1.0000 | LOZENGE | OROMUCOSAL | Status: DC | PRN
  Filled 2016-09-19: qty 9

## 2016-09-19 MED ORDER — BUPIVACAINE HCL (PF) 0.25 % IJ SOLN
INTRAMUSCULAR | Status: AC
  Filled 2016-09-19: qty 30

## 2016-09-19 MED ORDER — MIDAZOLAM HCL 5 MG/5ML IJ SOLN
INTRAMUSCULAR | Status: DC | PRN
  Administered 2016-09-19 (×2): 1 mg via INTRAVENOUS

## 2016-09-19 MED ORDER — FLEET ENEMA 7-19 GM/118ML RE ENEM: 1.0000 | ENEMA | Freq: Once | RECTAL | Status: DC | PRN

## 2016-09-19 MED ORDER — MAGNESIUM HYDROXIDE 400 MG/5ML PO SUSP
30.0000 mL | Freq: Every day | ORAL | Status: DC | PRN
  Administered 2016-09-21: 30 mL via ORAL
  Filled 2016-09-19: qty 30

## 2016-09-19 MED ORDER — FENTANYL CITRATE (PF) 100 MCG/2ML IJ SOLN
INTRAMUSCULAR | Status: AC
  Administered 2016-09-19: 25 ug via INTRAVENOUS
  Filled 2016-09-19: qty 2

## 2016-09-19 MED ORDER — ACETAMINOPHEN 325 MG PO TABS: 650.0000 mg | ORAL_TABLET | Freq: Four times a day (QID) | ORAL | Status: DC | PRN

## 2016-09-19 MED ORDER — FENTANYL CITRATE (PF) 100 MCG/2ML IJ SOLN
INTRAMUSCULAR | Status: DC | PRN
  Administered 2016-09-19 (×2): 50 ug via INTRAVENOUS

## 2016-09-19 MED ORDER — BISACODYL 10 MG RE SUPP: 10.0000 mg | Freq: Every day | RECTAL | Status: DC | PRN

## 2016-09-19 MED ORDER — DEXAMETHASONE SODIUM PHOSPHATE 10 MG/ML IJ SOLN
INTRAMUSCULAR | Status: DC | PRN
  Administered 2016-09-19: 10 mg via INTRAVENOUS

## 2016-09-19 MED ORDER — SODIUM CHLORIDE 0.9 % IV SOLN
Freq: Once | INTRAVENOUS | Status: AC
  Administered 2016-09-19: 15:00:00 via INTRAVENOUS

## 2016-09-19 MED ORDER — CEFAZOLIN SODIUM-DEXTROSE 2-4 GM/100ML-% IV SOLN
INTRAVENOUS | Status: AC
  Filled 2016-09-19: qty 100

## 2016-09-19 MED ORDER — KETAMINE HCL 50 MG/ML IJ SOLN
INTRAMUSCULAR | Status: DC | PRN
  Administered 2016-09-19 (×4): 2 mg via INTRAMUSCULAR

## 2016-09-19 MED ORDER — MORPHINE SULFATE (PF) 2 MG/ML IV SOLN
2.0000 mg | INTRAVENOUS | Status: DC | PRN
  Administered 2016-09-19: 2 mg via INTRAVENOUS
  Filled 2016-09-19: qty 1

## 2016-09-19 MED ORDER — ACETAMINOPHEN 10 MG/ML IV SOLN
1000.0000 mg | Freq: Four times a day (QID) | INTRAVENOUS | Status: AC
  Administered 2016-09-19 – 2016-09-20 (×4): 1000 mg via INTRAVENOUS
  Filled 2016-09-19 (×5): qty 100

## 2016-09-19 MED ORDER — ONDANSETRON HCL 4 MG/2ML IJ SOLN: 4.0000 mg | Freq: Once | INTRAMUSCULAR | Status: DC | PRN

## 2016-09-19 MED ORDER — TRANEXAMIC ACID 1000 MG/10ML IV SOLN: 1000.0000 mg | INTRAVENOUS | Status: DC

## 2016-09-19 MED ORDER — DIPHENHYDRAMINE HCL 12.5 MG/5ML PO ELIX: 12.5000 mg | ORAL_SOLUTION | ORAL | Status: DC | PRN

## 2016-09-19 MED ORDER — FENTANYL CITRATE (PF) 100 MCG/2ML IJ SOLN
25.0000 ug | INTRAMUSCULAR | Status: AC | PRN
  Administered 2016-09-19 (×6): 25 ug via INTRAVENOUS

## 2016-09-19 MED ORDER — ACETAMINOPHEN 10 MG/ML IV SOLN
INTRAVENOUS | Status: AC
  Filled 2016-09-19: qty 100

## 2016-09-19 MED ORDER — PHENOL 1.4 % MT LIQD
1.0000 | OROMUCOSAL | Status: DC | PRN
  Filled 2016-09-19: qty 177

## 2016-09-19 MED ORDER — EPHEDRINE SULFATE 50 MG/ML IJ SOLN
INTRAMUSCULAR | Status: DC | PRN
  Administered 2016-09-19: 10 mg via INTRAVENOUS
  Administered 2016-09-19: 5 mg via INTRAVENOUS

## 2016-09-19 MED ORDER — BUPROPION HCL ER (SR) 100 MG PO TB12
200.0000 mg | ORAL_TABLET | Freq: Two times a day (BID) | ORAL | Status: DC
  Administered 2016-09-19 – 2016-09-21 (×4): 200 mg via ORAL
  Filled 2016-09-19 (×4): qty 2

## 2016-09-19 MED ORDER — TRAMADOL HCL 50 MG PO TABS
50.0000 mg | ORAL_TABLET | ORAL | Status: DC | PRN
  Administered 2016-09-19 – 2016-09-20 (×5): 100 mg via ORAL
  Filled 2016-09-19 (×7): qty 2

## 2016-09-19 MED ORDER — ONDANSETRON HCL 4 MG PO TABS: 4.0000 mg | ORAL_TABLET | Freq: Four times a day (QID) | ORAL | Status: DC | PRN

## 2016-09-19 MED ORDER — ROSUVASTATIN CALCIUM 20 MG PO TABS
40.0000 mg | ORAL_TABLET | Freq: Every day | ORAL | Status: DC
  Administered 2016-09-20 – 2016-09-21 (×2): 40 mg via ORAL
  Filled 2016-09-19 (×2): qty 2

## 2016-09-19 MED ORDER — VITAMIN D 1000 UNITS PO TABS
2000.0000 [IU] | ORAL_TABLET | Freq: Every day | ORAL | Status: DC
  Administered 2016-09-20 – 2016-09-21 (×2): 2000 [IU] via ORAL
  Filled 2016-09-19 (×3): qty 2

## 2016-09-19 MED ORDER — FERROUS SULFATE 325 (65 FE) MG PO TABS
325.0000 mg | ORAL_TABLET | Freq: Two times a day (BID) | ORAL | Status: DC
Start: 2016-09-19 — End: 2016-09-21
  Administered 2016-09-19 – 2016-09-21 (×4): 325 mg via ORAL
  Filled 2016-09-19 (×4): qty 1

## 2016-09-19 MED ORDER — BUPIVACAINE HCL (PF) 0.25 % IJ SOLN
INTRAMUSCULAR | Status: DC | PRN
  Administered 2016-09-19: 60 mL

## 2016-09-19 MED ORDER — PHENYLEPHRINE HCL 10 MG/ML IJ SOLN
INTRAMUSCULAR | Status: DC | PRN
  Administered 2016-09-19: 25 ug/min via INTRAVENOUS

## 2016-09-19 MED ORDER — SODIUM CHLORIDE 0.9 % IV SOLN
1000.0000 mg | Freq: Once | INTRAVENOUS | Status: AC
  Administered 2016-09-19: 1000 mg via INTRAVENOUS
  Filled 2016-09-19: qty 10

## 2016-09-19 MED ORDER — PROPOFOL 10 MG/ML IV BOLUS
INTRAVENOUS | Status: DC | PRN
  Administered 2016-09-19 (×4): 20 mg via INTRAVENOUS

## 2016-09-19 MED ORDER — VASOPRESSIN 20 UNIT/ML IV SOLN
INTRAVENOUS | Status: DC | PRN
  Administered 2016-09-19: 2 [IU] via INTRAVENOUS

## 2016-09-19 MED ORDER — CEFAZOLIN SODIUM-DEXTROSE 2-4 GM/100ML-% IV SOLN
2.0000 g | Freq: Four times a day (QID) | INTRAVENOUS | Status: AC
  Administered 2016-09-19 – 2016-09-20 (×4): 2 g via INTRAVENOUS
  Filled 2016-09-19 (×4): qty 100

## 2016-09-19 MED ORDER — CEFAZOLIN SODIUM-DEXTROSE 2-4 GM/100ML-% IV SOLN: 2.0000 g | INTRAVENOUS | Status: DC

## 2016-09-19 MED ORDER — OXYCODONE HCL 5 MG PO TABS
5.0000 mg | ORAL_TABLET | ORAL | Status: DC | PRN
  Administered 2016-09-19 – 2016-09-21 (×9): 10 mg via ORAL
  Filled 2016-09-19 (×9): qty 2

## 2016-09-19 MED ORDER — LACTATED RINGERS IV SOLN
INTRAVENOUS | Status: DC
  Administered 2016-09-19 (×2): via INTRAVENOUS

## 2016-09-19 MED ORDER — CITALOPRAM HYDROBROMIDE 20 MG PO TABS
40.0000 mg | ORAL_TABLET | Freq: Every day | ORAL | Status: DC
  Administered 2016-09-20 – 2016-09-21 (×2): 40 mg via ORAL
  Filled 2016-09-19 (×2): qty 2

## 2016-09-19 MED ORDER — SODIUM CHLORIDE 0.9 % IJ SOLN
INTRAMUSCULAR | Status: AC
  Filled 2016-09-19: qty 50

## 2016-09-19 MED ORDER — LISINOPRIL 5 MG PO TABS
5.0000 mg | ORAL_TABLET | Freq: Every day | ORAL | Status: DC
  Administered 2016-09-20 – 2016-09-21 (×2): 5 mg via ORAL
  Filled 2016-09-19 (×2): qty 1

## 2016-09-19 MED ORDER — SODIUM CHLORIDE 0.9 % IV SOLN
INTRAVENOUS | Status: DC | PRN
  Administered 2016-09-19: 60 mL

## 2016-09-19 MED ORDER — NEOMYCIN-POLYMYXIN B GU 40-200000 IR SOLN
Status: AC
  Filled 2016-09-19: qty 20

## 2016-09-19 MED ORDER — SODIUM CHLORIDE 0.9 % IV SOLN
INTRAVENOUS | Status: DC
  Administered 2016-09-19: 1000 mL via INTRAVENOUS
  Administered 2016-09-19: 22:00:00 via INTRAVENOUS

## 2016-09-19 MED ORDER — ACETAMINOPHEN 10 MG/ML IV SOLN
INTRAVENOUS | Status: DC | PRN
  Administered 2016-09-19: 1000 mg via INTRAVENOUS

## 2016-09-19 SURGICAL SUPPLY — 58 items
AUTOTRANSFUS HAS 1/8 (MISCELLANEOUS) ×2
BATTERY INSTRU NAVIGATION (MISCELLANEOUS) ×8 IMPLANT
BLADE SAW 1 (BLADE) ×2 IMPLANT
BLADE SAW 1/2 (BLADE) ×2 IMPLANT
CANISTER SUCT 1200ML W/VALVE (MISCELLANEOUS) ×2 IMPLANT
CANISTER SUCT 3000ML (MISCELLANEOUS) ×4 IMPLANT
CAP KNEE TOTAL 3 SIGMA ×2 IMPLANT
CATH TRAY METER 16FR LF (MISCELLANEOUS) ×2 IMPLANT
CEMENT HV SMART SET (Cement) ×4 IMPLANT
COOLER POLAR GLACIER W/PUMP (MISCELLANEOUS) ×2 IMPLANT
CUFF TOURN 24 STER (MISCELLANEOUS) ×2 IMPLANT
CUFF TOURN 30 STER DUAL PORT (MISCELLANEOUS) IMPLANT
DRAPE SHEET LG 3/4 BI-LAMINATE (DRAPES) ×2 IMPLANT
DRSG DERMACEA 8X12 NADH (GAUZE/BANDAGES/DRESSINGS) ×2 IMPLANT
DRSG OPSITE POSTOP 4X14 (GAUZE/BANDAGES/DRESSINGS) ×2 IMPLANT
DRSG TEGADERM 4X4.75 (GAUZE/BANDAGES/DRESSINGS) ×2 IMPLANT
DURAPREP 26ML APPLICATOR (WOUND CARE) ×4 IMPLANT
ELECT CAUTERY BLADE 6.4 (BLADE) ×2 IMPLANT
ELECT REM PT RETURN 9FT ADLT (ELECTROSURGICAL) ×2
ELECTRODE REM PT RTRN 9FT ADLT (ELECTROSURGICAL) ×1 IMPLANT
EX-PIN ORTHOLOCK NAV 4X150 (PIN) ×4 IMPLANT
GLOVE BIOGEL M STRL SZ7.5 (GLOVE) ×4 IMPLANT
GLOVE INDICATOR 8.0 STRL GRN (GLOVE) ×2 IMPLANT
GLOVE SURG 9.0 ORTHO LTXF (GLOVE) ×2 IMPLANT
GLOVE SURG ORTHO 9.0 STRL STRW (GLOVE) ×2 IMPLANT
GOWN STRL REUS W/ TWL LRG LVL3 (GOWN DISPOSABLE) ×2 IMPLANT
GOWN STRL REUS W/TWL 2XL LVL3 (GOWN DISPOSABLE) ×2 IMPLANT
GOWN STRL REUS W/TWL LRG LVL3 (GOWN DISPOSABLE) ×2
HANDPIECE INTERPULSE COAX TIP (DISPOSABLE) ×1
HOLDER FOLEY CATH W/STRAP (MISCELLANEOUS) ×2 IMPLANT
HOOD PEEL AWAY FLYTE STAYCOOL (MISCELLANEOUS) ×4 IMPLANT
KIT RM TURNOVER STRD PROC AR (KITS) ×2 IMPLANT
KNIFE SCULPS 14X20 (INSTRUMENTS) ×2 IMPLANT
LABEL OR SOLS (LABEL) ×2 IMPLANT
NDL SAFETY 18GX1.5 (NEEDLE) ×2 IMPLANT
NEEDLE SPNL 20GX3.5 QUINCKE YW (NEEDLE) ×2 IMPLANT
NS IRRIG 500ML POUR BTL (IV SOLUTION) ×2 IMPLANT
PACK TOTAL KNEE (MISCELLANEOUS) ×2 IMPLANT
PAD WRAPON POLAR KNEE (MISCELLANEOUS) ×1 IMPLANT
PIN FIXATION 1/8DIA X 3INL (PIN) ×2 IMPLANT
SET HNDPC FAN SPRY TIP SCT (DISPOSABLE) ×1 IMPLANT
SOL .9 NS 3000ML IRR  AL (IV SOLUTION) ×1
SOL .9 NS 3000ML IRR UROMATIC (IV SOLUTION) ×1 IMPLANT
SOL PREP PVP 2OZ (MISCELLANEOUS) ×2
SOLUTION PREP PVP 2OZ (MISCELLANEOUS) ×1 IMPLANT
SPONGE DRAIN TRACH 4X4 STRL 2S (GAUZE/BANDAGES/DRESSINGS) ×2 IMPLANT
STAPLER SKIN PROX 35W (STAPLE) ×2 IMPLANT
SUCTION FRAZIER HANDLE 10FR (MISCELLANEOUS) ×1
SUCTION TUBE FRAZIER 10FR DISP (MISCELLANEOUS) ×1 IMPLANT
SUT VIC AB 0 CT1 36 (SUTURE) ×2 IMPLANT
SUT VIC AB 1 CT1 36 (SUTURE) ×4 IMPLANT
SUT VIC AB 2-0 CT2 27 (SUTURE) ×2 IMPLANT
SYR 20CC LL (SYRINGE) ×2 IMPLANT
SYR 30ML LL (SYRINGE) ×4 IMPLANT
SYSTEM AUTOTRANSFUS DUAL TROCR (MISCELLANEOUS) ×1 IMPLANT
TOWEL OR 17X26 4PK STRL BLUE (TOWEL DISPOSABLE) ×2 IMPLANT
TOWER CARTRIDGE SMART MIX (DISPOSABLE) ×2 IMPLANT
WRAPON POLAR PAD KNEE (MISCELLANEOUS) ×2

## 2016-09-19 NOTE — NC FL2 (Signed)
[place] LEVEL OF CARE SCREENING TOOL     IDENTIFICATION  Patient Name: Stuart Harvey Birthdate: 09/22/1951 Sex: male Admission Date (Current Location): 09/19/2016  [place] and [place] Number:  Engineering geologist and Address:  [facility], [address] [place], [place] [address]      Provider Number: [id number]  Attending Physician Name and Address:  Dereck Leep, MD  Relative Name and Phone Number:       Current Level of Care: Hospital Recommended Level of Care: [place] Prior Approval Number:    Date Approved/Denied:   PASRR Number:  ([id number])  Discharge Plan: SNF    Current Diagnoses: Patient Active Problem List   Diagnosis Date Noted  . S/P total knee arthroplasty 09/19/2016  . Chronic kidney disease 12/18/2015  . IFG (impaired fasting glucose) 12/18/2015  . Thyroid activity decreased 12/18/2015  . Renal mass 08/17/2015  . Pre-operative cardiovascular examination 02/05/2015  . H/O total knee replacement 08/28/2014  . Arthritis of knee, degenerative 05/05/2014  . Depression 08/28/2012  . Coronary artery disease   . Hypertension   . Hyperlipidemia   . AAA (abdominal aortic aneurysm) (HCC)     Orientation RESPIRATION BLADDER Height & Weight     Self, Time, Situation, Place  Normal Continent Weight: 214 lb (97.1 kg) Height:  6' (182.9 cm)  BEHAVIORAL SYMPTOMS/MOOD NEUROLOGICAL BOWEL NUTRITION STATUS   (none)  (none) Continent Diet (Full Code. )  AMBULATORY STATUS COMMUNICATION OF NEEDS Skin   Extensive Assist Verbally Surgical wounds (Incision: Left Knee )                       Personal Care Assistance Level of Assistance  Bathing, Feeding, Dressing Bathing Assistance: Limited assistance Feeding assistance: Independent Dressing Assistance: Limited assistance     Functional Limitations Info  Sight, Hearing, Speech Sight Info: Adequate Hearing Info: Adequate Speech Info:  Adequate    SPECIAL CARE FACTORS FREQUENCY  PT (By licensed PT), OT (By licensed OT)     PT Frequency:  (5) OT Frequency:  (5)            Contractures      Additional Factors Info  Code Status, Allergies Code Status Info:  (Full Code. ) Allergies Info:  (Codeine, Tetracycline)           Current Medications (09/19/2016):  This is the current hospital active medication list Current Facility-Administered Medications  Medication Dose Route Frequency Provider Last Rate Last Dose  . 0.9 %  sodium chloride infusion   Intravenous Continuous Dereck Leep, MD 100 mL/hr at 09/19/16 1206 1,000 mL at 09/19/16 1206  . acetaminophen (OFIRMEV) IV 1,000 mg  1,000 mg Intravenous Q6H Dereck Leep, MD      . Derrill Memo ON 09/20/2016] acetaminophen (TYLENOL) tablet 650 mg  650 mg Oral Q6H PRN Dereck Leep, MD       Or  . Derrill Memo ON 09/20/2016] acetaminophen (TYLENOL) suppository 650 mg  650 mg Rectal Q6H PRN Dereck Leep, MD      . alum & mag hydroxide-simeth (MAALOX/MYLANTA) 200-200-20 MG/5ML suspension 30 mL  30 mL Oral Q4H PRN Dereck Leep, MD      . bisacodyl (DULCOLAX) suppository 10 mg  10 mg Rectal Daily PRN Dereck Leep, MD      . buPROPion [facility] SR) 12 hr tablet 200 mg  200 mg Oral BID Dereck Leep, MD      .  ceFAZolin (ANCEF) IVPB 2g/100 mL premix  2 g Intravenous Q6H Dereck Leep, MD      . celecoxib (CELEBREX) capsule 200 mg  200 mg Oral Q12H Dereck Leep, MD      . cholecalciferol (VITAMIN D) tablet 2,000 Units  2,000 Units Oral Daily Dereck Leep, MD      . Derrill Memo ON 09/20/2016] citalopram (CELEXA) tablet 40 mg  40 mg Oral Daily Dereck Leep, MD      . diphenhydrAMINE (BENADRYL) 12.5 MG/5ML elixir 12.5-25 mg  12.5-25 mg Oral Q4H PRN Dereck Leep, MD      . enoxaparin (LOVENOX) injection 30 mg  30 mg Subcutaneous Q12H Dereck Leep, MD      . ferrous sulfate tablet 325 mg  325 mg Oral BID WC Dereck Leep, MD      . Derrill Memo ON 09/20/2016] levothyroxine  (SYNTHROID, LEVOTHROID) tablet 50 mcg  50 mcg Oral Daily Dereck Leep, MD      . Derrill Memo ON 09/20/2016] lisinopril (PRINIVIL,ZESTRIL) tablet 5 mg  5 mg Oral Daily Dereck Leep, MD      . magnesium hydroxide (MILK OF MAGNESIA) suspension 30 mL  30 mL Oral Daily PRN Dereck Leep, MD      . menthol-cetylpyridinium (CEPACOL) lozenge 3 mg  1 lozenge Oral PRN Dereck Leep, MD       Or  . phenol (CHLORASEPTIC) mouth spray 1 spray  1 spray Mouth/Throat PRN Dereck Leep, MD      . metoCLOPramide (REGLAN) tablet 10 mg  10 mg Oral TID AC & HS Dereck Leep, MD      . metoprolol tartrate (LOPRESSOR) tablet 12.5 mg  12.5 mg Oral BID Dereck Leep, MD      . morphine 2 MG/ML injection 2 mg  2 mg Intravenous Q2H PRN Dereck Leep, MD      . ondansetron (ZOFRAN) tablet 4 mg  4 mg Oral Q6H PRN Dereck Leep, MD       Or  . ondansetron (ZOFRAN) injection 4 mg  4 mg Intravenous Q6H PRN Dereck Leep, MD      . oxyCODONE (Oxy IR/ROXICODONE) immediate release tablet 5-10 mg  5-10 mg Oral Q4H PRN Dereck Leep, MD   10 mg at 09/19/16 1402  . pantoprazole (PROTONIX) EC tablet 40 mg  40 mg Oral BID Dereck Leep, MD   40 mg at 09/19/16 1358  . [START ON 09/20/2016] rosuvastatin (CRESTOR) tablet 40 mg  40 mg Oral Daily Dereck Leep, MD      . senna-docusate (Senokot-S) tablet 1 tablet  1 tablet Oral BID Dereck Leep, MD   1 tablet at 09/19/16 1358  . sodium phosphate (FLEET) 7-19 GM/118ML enema 1 enema  1 enema Rectal Once PRN Dereck Leep, MD      . traMADol Veatrice Bourbon) tablet 50-100 mg  50-100 mg Oral Q4H PRN Dereck Leep, MD         Discharge Medications: Please see discharge summary for a list of discharge medications.  Relevant Imaging Results:  Relevant Lab Results:   Additional Information  (SSN: [id number])  Sample, Veronia Beets, LCSW

## 2016-09-19 NOTE — Evaluation (Signed)
Physical Therapy Evaluation Patient Details Name: XIANG REUM MRN: [id number] DOB: 1951-07-02 Today's Date: 09/19/2016   History of Present Illness  Pt underwent L TKR and is POD#0 at time of initial evaluation. Pt has a history of R TKR approximately 2 years ago. No reported falls in the last 12 months.   Clinical Impression  Pt admitted with above diagnosis. Pt currently with functional limitations due to the deficits listed below (see PT Problem List).  Pt demonstrates good speed and sequencing when moving from supine to sitting at R side of bed. Good L hip flexion strength and L hip adduction. In supine BP is 91/54 and pt reports feeling minimally dizzy. Once upright at EOB pt reports increased lightheadedness. BP obtained with RN present and found to be 74/40. Pt remains sitting for 2-3 minutes but continues to feel dizzy so returned to supine. BP obtained in supine and systolic returns to [id number] after 2-3 minutes. Pt unable to transfer or ambulate at this time due to symptomatic orthostatic hypotension. Pt able to complete all supine exercises in the bed with good LLE strength noted. Will progress transfers and ambulation tomorrow AM as tolerated. Pt will benefit from skilled PT services to address deficits in strength, balance, and mobility in order to return to full function at home.     Follow Up Recommendations Home health PT    Equipment Recommendations  Rolling walker with 5" wheels    Recommendations for Other Services       Precautions / Restrictions Precautions Precautions: Knee;Fall Precaution Booklet Issued: Yes (comment) Restrictions Weight Bearing Restrictions: Yes LLE Weight Bearing: Weight bearing as tolerated      Mobility  Bed Mobility Overal bed mobility: Needs Assistance Bed Mobility: Supine to Sit;Sit to Supine     Supine to sit: Supervision Sit to supine: Supervision   General bed mobility comments: Pt demonstrates good speed and sequencing when  moving from supine to sitting at R side of bed. Good L hip flexion strength and L hip adduction. In supine BP is 91/54 and pt reports feeling minimally dizzy. Once upright at EOB pt reports increased dizziness. BP obtained with RN and found to be 74/40. Pt remains sitting for 2-3 minutes but continues to feel dizzy so returned to supine. BP obtained in supine and systolic returns to [id number]. RN in the middle of connecting patient's autovac to IV. Unable to attempt transfers or ambulation at this time due to symptomatic orthostatic hypotension. Will begin transfers and ambulation tomorrow on POD#1.   Transfers                    Ambulation/Gait                Stairs            Wheelchair Mobility    Modified Rankin (Stroke Patients Only)       Balance Overall balance assessment: Needs assistance Sitting-balance support: No upper extremity supported Sitting balance-Leahy Scale: Normal                                       Pertinent Vitals/Pain Pain Assessment: 0-10 Pain Score: 5  Pain Location: L knee Pain Descriptors / Indicators: Operative site guarding Pain Intervention(s): Monitored during session    Home Living Family/patient expects to be discharged to:: Private residence Living Arrangements: Spouse/significant other Available Help at Discharge: Family Type of  Home: House Home Access: Stairs to enter Entrance Stairs-Rails: Right Entrance Stairs-Number of Steps: 6 Home Layout: One level Home Equipment: [place] - 4 wheels;Shower seat - built in;Other (comment) (no 2 wheeled walker)      Prior Function Level of Independence: Independent         Comments: Full community without assistive device. Independent with ADLs/IADLs     Hand Dominance   Dominant Hand: Right    Extremity/Trunk Assessment   Upper Extremity Assessment: Overall WFL for tasks assessed           Lower Extremity Assessment: LLE deficits/detail   LLE  Deficits / Details: Pt able to perform full SLR and SAQ without assistance. Full DF/PF and pt reports intact sensation to light touch in LLE     Communication   Communication: No difficulties  Cognition Arousal/Alertness: Awake/alert Behavior During Therapy: WFL for tasks assessed/performed Overall Cognitive Status: Within Functional Limits for tasks assessed                      General Comments      Exercises Total Joint Exercises Ankle Circles/Pumps: Strengthening;Both;10 reps;Supine Quad Sets: Strengthening;Both;10 reps;Supine Gluteal Sets: Strengthening;Both;10 reps;Supine Towel Squeeze: Strengthening;Both;10 reps;Supine Short Arc Quad: Strengthening;Left;10 reps;Supine Heel Slides: Strengthening;Left;10 reps;Supine Hip ABduction/ADduction: Strengthening;Left;10 reps;Supine Straight Leg Raises: Strengthening;Left;10 reps;Supine Goniometric ROM: 0-84 degrees AAROM, pain limited   Assessment/Plan    PT Assessment Patient needs continued PT services  PT Problem List Decreased strength;Decreased range of motion;Decreased activity tolerance;Decreased balance;Pain;Decreased mobility;Decreased knowledge of use of DME          PT Treatment Interventions DME instruction;Gait training;Stair training;Functional mobility training;Therapeutic activities;Therapeutic exercise;Balance training;Neuromuscular re-education;Patient/family education;Manual techniques    PT Goals (Current goals can be found in the Care Plan section)  Acute Rehab PT Goals Patient Stated Goal: Return to prior level of function. Return to working on cars PT Goal Formulation: With patient Time For Goal Achievement: 10/03/16 Potential to Achieve Goals: Good    Frequency BID   Barriers to discharge        Co-evaluation               End of Session   Activity Tolerance: Treatment limited secondary to medical complications (Comment);Other (comment) (Limited secondary to orthostatic  hypotension) Patient left: in bed;with call bell/phone within reach;with bed alarm set;with nursing/sitter in room Nurse Communication: Other (comment) (orthostatic hypotension)         Time: [id number] PT Time Calculation (min) (ACUTE ONLY): 32 min   Charges:   PT Evaluation $PT Eval Low Complexity: 1 Procedure PT Treatments $Therapeutic Exercise: 8-22 mins   PT G Codes:       Lyndel Safe Braya Habermehl PT, DPT   Mckaylee Dimalanta 09/19/2016, 3:56 PM

## 2016-09-19 NOTE — Anesthesia Preprocedure Evaluation (Signed)
Anesthesia Evaluation  Patient identified by MRN, date of birth, ID band Patient awake    Reviewed: Allergy & Precautions, NPO status , Patient's Chart, lab work & pertinent test results, reviewed documented beta blocker date and time   Airway Mallampati: III  TM Distance: >3 FB     Dental  (+) Chipped   Pulmonary former smoker,           Cardiovascular hypertension, Pt. on medications and Pt. on home beta blockers + CAD, + Past MI, + CABG and + Peripheral Vascular Disease       Neuro/Psych PSYCHIATRIC DISORDERS Depression    GI/Hepatic   Endo/Other  Hypothyroidism   Renal/GU Renal InsufficiencyRenal disease     Musculoskeletal  (+) Arthritis ,   Abdominal   Peds  Hematology   Anesthesia Other Findings   Reproductive/Obstetrics                             Anesthesia Physical Anesthesia Plan  ASA: III  Anesthesia Plan: Spinal   Post-op Pain Management:    Induction:   Airway Management Planned:   Additional Equipment:   Intra-op Plan:   Post-operative Plan:   Informed Consent: I have reviewed the patients History and Physical, chart, labs and discussed the procedure including the risks, benefits and alternatives for the proposed anesthesia with the patient or authorized representative who has indicated his/her understanding and acceptance.     Plan Discussed with: CRNA  Anesthesia Plan Comments:         Anesthesia Quick Evaluation

## 2016-09-19 NOTE — Anesthesia Procedure Notes (Signed)
Spinal  Patient location during procedure: OR Staffing Anesthesiologist: Khloe Hunkele Performed: anesthesiologist  Preanesthetic Checklist Completed: patient identified, site marked, surgical consent, pre-op evaluation, timeout performed, IV checked and risks and benefits discussed Spinal Block Patient position: sitting Prep: Betadine Patient monitoring: heart rate, cardiac monitor, continuous pulse ox and blood pressure Approach: midline Location: L3-4 Injection technique: single-shot Needle Needle type: Pencil-Tip  Needle gauge: 25 G Needle length: 9 cm Assessment Sensory level: T10 Additional Notes Marcaine 1.6ml.     

## 2016-09-19 NOTE — H&P (Signed)
The patient has been re-examined, and the chart reviewed, and there have been no interval changes to the documented history and physical.    The risks, benefits, and alternatives have been discussed at length. The patient expressed understanding of the risks benefits and agreed with plans for surgical intervention.  James P. Hooten, Jr. M.D.    

## 2016-09-19 NOTE — Transfer of Care (Signed)
Immediate Anesthesia Transfer of Care Note  Patient: Stuart Harvey  Procedure(s) Performed: Procedure(s): COMPUTER ASSISTED TOTAL KNEE ARTHROPLASTY (Left)  Patient Location: PACU  Anesthesia Type:Spinal  Level of Consciousness: awake and patient cooperative  Airway & Oxygen Therapy: Patient Spontanous Breathing and Patient connected to nasal cannula oxygen  Post-op Assessment: Report given to RN and Post -op Vital signs reviewed and stable  Post vital signs: Reviewed and stable  Last Vitals:  Vitals:   09/19/16 0605 09/19/16 1104  BP: 127/75 96/68  Pulse: 61 60  Resp: 16 16  Temp: 36.5 C (!) 36 C    Last Pain:  Vitals:   09/19/16 0605  TempSrc: Oral         Complications: No apparent anesthesia complications

## 2016-09-19 NOTE — Op Note (Signed)
OPERATIVE NOTE  DATE OF SURGERY:  09/19/2016  PATIENT NAME:  Stuart Harvey   DOB: [birth year]  MRN: [id number]  PRE-OPERATIVE DIAGNOSIS: Degenerative arthrosis of the left knee, primary  POST-OPERATIVE DIAGNOSIS:  Same  PROCEDURE:  Left total knee arthroplasty using computer-assisted navigation  SURGEON:  Marciano Sequin. M.D.  ASSISTANT:  Vance Peper, PA (present and scrubbed throughout the case, critical for assistance with exposure, retraction, instrumentation, and closure)  ANESTHESIA: spinal  ESTIMATED BLOOD LOSS: 500 mL  FLUIDS REPLACED: 2800 mL of crystalloid  TOURNIQUET TIME: 90 minutes  DRAINS: 2 medium drains to a reinfusion system  SOFT TISSUE RELEASES: Anterior cruciate ligament, posterior cruciate ligament, deep medial collateral ligament, patellofemoral ligament  IMPLANTS UTILIZED: DePuy PFC Sigma size 4 posterior stabilized femoral component (cemented), size 5 MBT tibial component (cemented), 41 mm 3 peg oval dome patella (cemented), and a 12.5 mm stabilized rotating platform polyethylene insert.  INDICATIONS FOR SURGERY: Stuart Harvey is a 65 y.o. year old male with a long history of progressive knee pain. X-rays demonstrated severe degenerative changes in tricompartmental fashion. The patient had not seen any significant improvement despite conservative nonsurgical intervention. After discussion of the risks and benefits of surgical intervention, the patient expressed understanding of the risks benefits and agree with plans for total knee arthroplasty.   The risks, benefits, and alternatives were discussed at length including but not limited to the risks of infection, bleeding, nerve injury, stiffness, blood clots, the need for revision surgery, cardiopulmonary complications, among others, and they were willing to proceed.  PROCEDURE IN DETAIL: The patient was brought into the operating room and, after adequate spinal anesthesia was achieved, a tourniquet was  placed on the patient's upper thigh. The patient's knee and leg were cleaned and prepped with alcohol and DuraPrep and draped in the usual sterile fashion. A "timeout" was performed as per usual protocol. The lower extremity was exsanguinated using an Esmarch, and the tourniquet was inflated to 300 mmHg. An anterior longitudinal incision was made followed by a standard mid vastus approach. The deep fibers of the medial collateral ligament were elevated in a subperiosteal fashion off of the medial flare of the tibia so as to maintain a continuous soft tissue sleeve. The patella was subluxed laterally and the patellofemoral ligament was incised. Inspection of the knee demonstrated severe degenerative changes with full-thickness loss of articular cartilage. Osteophytes were debrided using a rongeur. Anterior and posterior cruciate ligaments were excised. Two 4.0 mm Schanz pins were inserted in the femur and into the tibia for attachment of the array of trackers used for computer-assisted navigation. Hip center was identified using a circumduction technique. Distal landmarks were mapped using the computer. The distal femur and proximal tibia were mapped using the computer. The distal femoral cutting guide was positioned using computer-assisted navigation so as to achieve a 5 distal valgus cut. The femur was sized and it was felt that a size 4 femoral component was appropriate. A size 4 femoral cutting guide was positioned and the anterior cut was performed and verified using the computer. This was followed by completion of the posterior and chamfer cuts. Femoral cutting guide for the central box was then positioned in the center box cut was performed.  Attention was then directed to the proximal tibia. Medial and lateral menisci were excised. The extramedullary tibial cutting guide was positioned using computer-assisted navigation so as to achieve a 0 varus-valgus alignment and 0 posterior slope. The cut was  performed and verified using  the computer. The proximal tibia was sized and it was felt that a size 5 tibial tray was appropriate. Tibial and femoral trials were inserted followed by insertion of a 10 and subsequently a 12.5 mm polyethylene insert. This allowed for excellent mediolateral soft tissue balancing both in flexion and in full extension. Finally, the patella was cut and prepared so as to accommodate a 41 mm 3 peg oval dome patella. A patella trial was placed and the knee was placed through a range of motion with excellent patellar tracking appreciated. The femoral trial was removed after debridement of posterior osteophytes. The central post-hole for the tibial component was reamed followed by insertion of a keel punch. Tibial trials were then removed. Cut surfaces of bone were irrigated with copious amounts of normal saline with antibiotic solution using pulsatile lavage and then suctioned dry. Polymethylmethacrylate cement was prepared in the usual fashion using a vacuum mixer. Cement was applied to the cut surface of the proximal tibia as well as along the undersurface of a size 5 MBT tibial component. Tibial component was positioned and impacted into place. Excess cement was removed using [employer] fast streamer. Cement was then applied to the cut surfaces of the femur as well as along the posterior flanges of the size 4 femoral component. The femoral component was positioned and impacted into place. Excess cement was removed using [employer] fast streamer. A 12.5 mm polyethylene trial was inserted and the knee was brought into full extension with steady axial compression applied. Finally, cement was applied to the backside of a 41 mm 3 peg oval dome patella and the patellar component was positioned and patellar clamp applied. Excess cement was removed using [employer] fast streamer. After adequate curing of the cement, the tourniquet was deflated after a total tourniquet time of 90 minutes. Hemostasis was achieved using  electrocautery. The knee was irrigated with copious amounts of normal saline with antibiotic solution using pulsatile lavage and then suctioned dry. 20 mL of 1.3% Exparel and 60 mL of 0.25% Marcaine in 40 mL of normal saline was injected along the posterior capsule, medial and lateral gutters, and along the arthrotomy site. A 12.5 mm stabilized rotating platform polyethylene insert was inserted and the knee was placed through a range of motion with excellent mediolateral soft tissue balancing appreciated and excellent patellar tracking noted. 2 medium drains were placed in the wound bed and brought out through separate stab incisions to be attached to a reinfusion system. The medial parapatellar portion of the incision was reapproximated using interrupted sutures of #1 Vicryl. Subcutaneous tissue was approximated in layers using first #0 Vicryl followed #2-0 Vicryl. The skin was approximated with skin staples. A sterile dressing was applied.  The patient tolerated the procedure well and was transported to the recovery room in stable condition.    Ernesteen Mihalic P. Holley Bouche., M.D.

## 2016-09-19 NOTE — Brief Op Note (Signed)
09/19/2016  11:07 AM  PATIENT:  Stuart Harvey  65 y.o. male  PRE-OPERATIVE DIAGNOSIS:  osteoarthritis of the left knee  POST-OPERATIVE DIAGNOSIS:  Same  PROCEDURE:  Procedure(s): COMPUTER ASSISTED TOTAL KNEE ARTHROPLASTY (Left)  SURGEON:  Surgeon(s) and Role:    * Dereck Leep, MD - Primary  ASSISTANTS: Vance Peper, PA  ANESTHESIA:   spinal  EBL:  Total I/O In: 2800 [I.V.:2800] Out: 1150 [Urine:650; Blood:500]  BLOOD ADMINISTERED:none  DRAINS: 2 medium drains to a reinfusion system   LOCAL MEDICATIONS USED:  MARCAINE    and OTHER Exparel  SPECIMEN:  No Specimen  DISPOSITION OF SPECIMEN:  N/A  COUNTS:  YES  TOURNIQUET:   90 minutes  DICTATION: .Dragon Dictation  PLAN OF CARE: Admit to inpatient   PATIENT DISPOSITION:  PACU - hemodynamically stable.   Delay start of Pharmacological VTE agent (>24hrs) due to surgical blood loss or risk of bleeding: yes

## 2016-09-20 LAB — BASIC METABOLIC PANEL
ANION GAP: 3 — AB (ref 5–15)
BUN: 27 mg/dL — ABNORMAL HIGH (ref 6–20)
CALCIUM: 8.4 mg/dL — AB (ref 8.9–10.3)
CHLORIDE: 110 mmol/L (ref 101–111)
CO2: 25 mmol/L (ref 22–32)
Creatinine, Ser: 1.07 mg/dL (ref 0.61–1.24)
GFR calc non Af Amer: >60 mL/min (ref >60)
Glucose, Bld: 141 mg/dL — ABNORMAL HIGH (ref 65–99)
Potassium: 4.4 mmol/L (ref 3.5–5.1)
Sodium: 138 mmol/L (ref 135–145)

## 2016-09-20 LAB — CBC
HEMATOCRIT: 27.4 % — AB (ref 40.0–52.0)
HEMOGLOBIN: 9 g/dL — AB (ref 13.0–18.0)
MCH: 27.7 pg (ref 26.0–34.0)
MCHC: 32.8 g/dL (ref 32.0–36.0)
MCV: 84.4 fL (ref 80.0–100.0)
Platelets: 141 10*3/uL — ABNORMAL LOW (ref 150–440)
RBC: 3.24 MIL/uL — ABNORMAL LOW (ref 4.40–5.90)
RDW: 15.5 % — AB (ref 11.5–14.5)
WBC: 11.2 10*3/uL — AB (ref 3.8–10.6)

## 2016-09-20 NOTE — Progress Notes (Signed)
   Subjective: 1 Day Post-Op Procedure(s) (LRB): COMPUTER ASSISTED TOTAL KNEE ARTHROPLASTY (Left) Patient reports pain as mild.   Patient is well, and has had no acute complaints or problems We will start therapy today.  Plan is to go Home after hospital stay. no nausea and no vomiting Patient denies any chest pains or shortness of breath. Patient resting very well. No placed on the night. Patient states his been drinking plenty of fluids for the night. Has been using his IS.  Objective: Vital signs in last 24 hours: Temp:  [96.8 F (36 C)-98.5 F (36.9 C)] 97.7 F (36.5 C) (11/07 0359) Pulse Rate:  [56-65] 60 (11/07 0359) Resp:  [12-24] 18 (11/07 0359) BP: (74-110)/(40-70) 105/60 (11/07 0359) SpO2:  [92 %-99 %] 97 % (11/07 0359) Weight:  [97.1 kg (214 lb)] 97.1 kg (214 lb) (11/06 1230) Heels are non tender and elevated off the bed using rolled towels polar care in place  Intake/Output from previous day: 11/06 0701 - 11/07 0700 In: 6776.7 [P.O.:720; I.V.:5196.7; Blood:260; IV [id number] Out: [id number] [Urine:3400; Drains:585; Blood:500] Intake/Output this shift: No intake/output data recorded.   Recent Labs  09/20/16 0339  HGB 9.0*    Recent Labs  09/20/16 0339  WBC 11.2*  RBC 3.24*  HCT 27.4*  PLT 141*    Recent Labs  09/20/16 0339  NA 138  K 4.4  CL 110  CO2 25  BUN 27*  CREATININE 1.07  GLUCOSE 141*  CALCIUM 8.4*   No results for input(s): LABPT, INR in the last 72 hours.  EXAM General - Patient is Alert, Appropriate and Oriented Extremity - Neurologically intact Neurovascular intact Sensation intact distally Intact pulses distally Dorsiflexion/Plantar flexion intact No cellulitis present Compartment soft Dressing - dressing C/D/I Motor Function - intact, moving foot and toes well on exam. Patient able to do straight leg raise on own.  Past Medical History:  Diagnosis Date  . AAA (abdominal aortic aneurysm) ([place])    s/p surgical repair  in 08/2007 by Dr. Hulda Humphrey  . Arthritis    both knees  . Chronic kidney disease   . Coronary artery disease    CABG at [facility] in 06/2007 for 3 vessel CAD, LIMA to LAD, SVG to RPDA, SVG to OM2 and SVG to D2.  Cath in 2011 showed patent grafts. EF 40%  . Depression   . Hernia of abdominal cavity   . History of heart attack    "silent heart attack"  . Hyperlipidemia   . Hypertension   . Hypothyroidism   . Myocardial infarction     Assessment/Plan: 1 Day Post-Op Procedure(s) (LRB): COMPUTER ASSISTED TOTAL KNEE ARTHROPLASTY (Left) Active Problems:   S/P total knee arthroplasty  Estimated body mass index is 29.02 kg/m as calculated from the following:   Height as of this encounter: 6' (1.829 m).   Weight as of this encounter: 97.1 kg (214 lb). Advance diet Up with therapy D/C IV fluids Plan for discharge tomorrow Discharge home with home health  Labs: Were reviewed DVT Prophylaxis - Lovenox, Foot Pumps and TED hose Weight-Bearing as tolerated to left leg Begin working on bowel movement Labs tomorrow morning Encouraged fluid intake as well as use of incentive spirometer  Jon R. [place] 09/20/2016, 7:22 AM

## 2016-09-20 NOTE — Progress Notes (Signed)
OT Cancellation Note  Patient Details Name: Stuart Harvey MRN: [id number] DOB: 1951-09-06   Cancelled Treatment:    Reason Eval/Treat Not Completed: OT screened, no needs identified, will sign off Order received and chart reviewed.  Met with patient and his wife and pt seen for screening only due to pt being fully educated in adaptive equipment from last knee surgery and did not feed he needed any further training in this.  Reviewed safety in bathroom and notified PT Judson Roch) that the only walker he has at home has 4 wheels.  No OT needs at this time.  Please re-consult if any needs arise.  Thank you for the consult.   Chrys Racer, OTR/L ascom [phone] 09/20/16, 10:46 AM

## 2016-09-20 NOTE — Progress Notes (Signed)
Physical Therapy Treatment Patient Details Name: Stuart Harvey MRN: [id number] DOB: 03-29-1951 Today's Date: 09/20/2016    History of Present Illness Pt underwent L TKR and is POD#0 at time of initial evaluation. Pt has a history of R TKR approximately 2 years ago. No reported falls in the last 12 months.     PT Comments    Pt awake and ready for session.  Participated in exercises as described below.   BP readings: Supine 123/69 Sitting 99/66 Sitting after 3 minutes 108/63 Sitting after 8 minutes 121/74 Standing 92/66 Standing after 3 min 109/68  Pt with complaints of dizziness throughout session but relieved with time in each position.  Rates dizziness 5/10.  Pt was assisted to recliner at bedside with le's elevated and dizziness relieved.  Gait deferred at this time.  Will try for gait this pm.  Pt is doing very well with ex and rom.  He had a good strong SLR and ROM 0-86 degrees.  Discussed with primary nurse.   Follow Up Recommendations  Home health PT     Equipment Recommendations  Rolling walker with 5" wheels    Recommendations for Other Services       Precautions / Restrictions Precautions Precautions: Knee;Fall Restrictions Weight Bearing Restrictions: Yes LLE Weight Bearing: Weight bearing as tolerated    Mobility  Bed Mobility Overal bed mobility: Modified Independent Bed Mobility: Supine to Sit     Supine to sit: Supervision        Transfers Overall transfer level: Needs assistance Equipment used: Rolling walker (2 wheeled) Transfers: Sit to/from [facility] Sit to Stand: Min assist Stand pivot transfers: Min guard          Ambulation/Gait             General Gait Details: deferred due to orthostatic bp   Stairs            Wheelchair Mobility    Modified Rankin (Stroke Patients Only)       Balance Overall balance assessment: Needs assistance Sitting-balance support: Feet supported Sitting  balance-Leahy Scale: Normal     Standing balance support: Bilateral upper extremity supported Standing balance-Leahy Scale: Fair                      Cognition Arousal/Alertness: Awake/alert Behavior During Therapy: WFL for tasks assessed/performed Overall Cognitive Status: Within Functional Limits for tasks assessed                      Exercises Total Joint Exercises Ankle Circles/Pumps: Strengthening;Both;10 reps;Supine Quad Sets: Strengthening;Both;10 reps;Supine Gluteal Sets: Strengthening;Both;10 reps;Supine Towel Squeeze: Strengthening;Both;10 reps;Supine Short Arc Quad: Strengthening;Left;10 reps;Supine Heel Slides: Strengthening;Left;10 reps;Supine Hip ABduction/ADduction: Strengthening;Left;10 reps;Supine Straight Leg Raises: Strengthening;Left;10 reps;Supine Long Arc Quad: AROM;Left;10 reps;Seated Knee Flexion: AAROM;Left;10 reps;Seated Goniometric ROM: 0-86 (limited by pain/banadaging)    General Comments        Pertinent Vitals/Pain Pain Assessment: 0-10 Pain Score: 4  Pain Location: L knee Pain Descriptors / Indicators: Aching Pain Intervention(s): Monitored during session;Ice applied    Home Living                      Prior Function            PT Goals (current goals can now be found in the care plan section) Progress towards PT goals: Not progressing toward goals - comment (BP limiting at this time but strength/rom progressing well)    Frequency  BID      PT Plan Current plan remains appropriate    Co-evaluation             End of Session Equipment Utilized During Treatment: Gait belt Activity Tolerance: Treatment limited secondary to medical complications (Comment);Other (comment) Patient left: in chair;with call bell/phone within reach;with chair alarm set     Time: [id number] PT Time Calculation (min) (ACUTE ONLY): 27 min  Charges:  $Therapeutic Exercise: 8-22 mins $Therapeutic Activity: 8-22  mins                    G Codes:      [name] 10-04-16, 10:05 AM

## 2016-09-20 NOTE — Discharge Summary (Signed)
Physician Discharge Summary  Patient ID: Stuart Harvey MRN: [id number] DOB/AGE: 21-May-1951 65 y.o.  Admit date: 09/19/2016 Discharge date: 09/21/2016  Admission Diagnoses:  osteoarthritis   Discharge Diagnoses: Patient Active Problem List   Diagnosis Date Noted  . S/P total knee arthroplasty 09/19/2016  . Chronic kidney disease 12/18/2015  . IFG (impaired fasting glucose) 12/18/2015  . Thyroid activity decreased 12/18/2015  . Renal mass 08/17/2015  . Pre-operative cardiovascular examination 02/05/2015  . H/O total knee replacement 08/28/2014  . Arthritis of knee, degenerative 05/05/2014  . Depression 08/28/2012  . Coronary artery disease   . Hypertension   . Hyperlipidemia   . AAA (abdominal aortic aneurysm) [facility])     Past Medical History:  Diagnosis Date  . AAA (abdominal aortic aneurysm) ([place])    s/p surgical repair in 08/2007 by Dr. Hulda Humphrey  . Arthritis    both knees  . Chronic kidney disease   . Coronary artery disease    CABG at [facility] in 06/2007 for 3 vessel CAD, LIMA to LAD, SVG to RPDA, SVG to OM2 and SVG to D2.  Cath in 2011 showed patent grafts. EF 40%  . Depression   . Hernia of abdominal cavity   . History of heart attack    "silent heart attack"  . Hyperlipidemia   . Hypertension   . Hypothyroidism   . Myocardial infarction      Transfusion: Autovac transfusions given doing this admission   Consultants (if any):  none  Discharged Condition: Improved  Hospital Course: PEACE [name] is an 65 y.o. male who was admitted 09/19/2016 with a diagnosis of degenerative arthrosis left knee and went to the operating room on 09/19/2016 and underwent the above named procedures.    Surgeries:Procedure(s): COMPUTER ASSISTED TOTAL KNEE ARTHROPLASTY on 09/19/2016  PRE-OPERATIVE DIAGNOSIS: Degenerative arthrosis of the left knee, primary  POST-OPERATIVE DIAGNOSIS:  Same  PROCEDURE:  Left total knee arthroplasty using computer-assisted navigation  SURGEON:   Marciano Sequin. M.D.  ASSISTANT:  Vance Peper, PA (present and scrubbed throughout the case, critical for assistance with exposure, retraction, instrumentation, and closure)  ANESTHESIA: spinal  ESTIMATED BLOOD LOSS: 500 mL  FLUIDS REPLACED: 2800 mL of crystalloid  TOURNIQUET TIME: 90 minutes  DRAINS: 2 medium drains to a reinfusion system  SOFT TISSUE RELEASES: Anterior cruciate ligament, posterior cruciate ligament, deep medial collateral ligament, patellofemoral ligament  IMPLANTS UTILIZED: DePuy PFC Sigma size 4 posterior stabilized femoral component (cemented), size 5 MBT tibial component (cemented), 41 mm 3 peg oval dome patella (cemented), and a 12.5 mm stabilized rotating platform polyethylene insert.  INDICATIONS FOR SURGERY: Stuart Harvey is a 65 y.o. year old male with a long history of progressive knee pain. X-rays demonstrated severe degenerative changes in tricompartmental fashion. The patient had not seen any significant improvement despite conservative nonsurgical intervention. After discussion of the risks and benefits of surgical intervention, the patient expressed understanding of the risks benefits and agree with plans for total knee arthroplasty.   The risks, benefits, and alternatives were discussed at length including but not limited to the risks of infection, bleeding, nerve injury, stiffness, blood clots, the need for revision surgery, cardiopulmonary complications, among others, and they were willing to proceed.  Patient tolerated the surgery well. No complications .Patient was taken to PACU where she was stabilized and then transferred to the orthopedic floor.  Patient started on Lovenox 30 mg q 12 hrs. Foot pumps applied bilaterally at 80 mm hg. Heels elevated off bed with  rolled towels. No evidence of DVT. Calves non tender. Negative Homan. Physical therapy started on day #1 for gait training and transfer with OT starting on  day #1 for ADL and  assisted devices. Patient has done well with therapy. Ambulated greater than 200 feet upon being discharged. Patient was able to go up and down 4 steps safely and independently  Patient's IV and Foley were discontinued on day #1 with Hemovac being discontinued on day #2. Dressing was also changed on day #2 prior to being discharged to home   He was given perioperative antibiotics:  Anti-infectives    Start     Dose/Rate Route Frequency Ordered Stop   09/19/16 1400  ceFAZolin (ANCEF) IVPB 2g/100 mL premix     2 g 200 mL/hr over 30 Minutes Intravenous Every 6 hours 09/19/16 1249 09/20/16 1359   09/19/16 0628  ceFAZolin (ANCEF) IVPB 2g/100 mL premix  Status:  Discontinued     2 g 200 mL/hr over 30 Minutes Intravenous On call to O.R. 09/19/16 0628 09/19/16 1226   09/19/16 0600  ceFAZolin (ANCEF) IVPB 2g/100 mL premix     2 g 200 mL/hr over 30 Minutes Intravenous On call to O.R. 09/18/16 2241 09/19/16 0750   09/19/16 0600  ceFAZolin (ANCEF) 2-4 GM/100ML-% IVPB    Comments:  MILES, CHRISTINA: cabinet override      09/19/16 0600 09/19/16 0735    .  He was fitted with AV 1 compression foot pump devices, instructed on heel pumps, early ambulation, and TED stockings bilaterally for DVT prophylaxis.  He benefited maximally from the hospital stay and there were no complications.    Recent vital signs:  Vitals:   09/20/16 0359 09/20/16 0736  BP: 105/60 113/66  Pulse: 60 (!) 58  Resp: 18 18  Temp: 97.7 F (36.5 C) 97.9 F (36.6 C)    Recent laboratory studies:  Lab Results  Component Value Date   HGB 9.0 (L) 09/20/2016   HGB 12.8 (L) 09/06/2016   HGB 12.2 (L) 02/12/2015   Lab Results  Component Value Date   WBC 11.2 (H) 09/20/2016   PLT 141 (L) 09/20/2016   Lab Results  Component Value Date   INR 0.97 09/06/2016   Lab Results  Component Value Date   NA 138 09/20/2016   K 4.4 09/20/2016   CL 110 09/20/2016   CO2 25 09/20/2016   BUN 27 (H) 09/20/2016   CREATININE 1.07  09/20/2016   GLUCOSE 141 (H) 09/20/2016    Discharge Medications:     Medication List    TAKE these medications   aspirin 81 MG tablet Take 81 mg by mouth daily.   buPROPion 200 MG 12 hr tablet Commonly known as:  WELLBUTRIN SR Take 1 tablet (200 mg total) by mouth 2 (two) times daily.   citalopram 40 MG tablet Commonly known as:  CELEXA Take 1 tablet (40 mg total) by mouth daily.   D3 SUPER STRENGTH 2000 units Caps Generic drug:  Cholecalciferol Take 1 capsule by mouth daily.   enoxaparin 40 MG/0.4ML injection Commonly known as:  LOVENOX Inject 0.4 mLs (40 mg total) into the skin daily.   levothyroxine 50 MCG tablet Commonly known as:  SYNTHROID, LEVOTHROID Take 1 tablet (50 mcg total) by mouth daily.   lisinopril 5 MG tablet Commonly known as:  PRINIVIL,ZESTRIL Take 1 tablet (5 mg total) by mouth daily.   metoprolol tartrate 25 MG tablet Commonly known as:  LOPRESSOR Take 0.5 tablets (12.5 mg total) by  mouth 2 (two) times daily.   omeprazole 20 MG capsule Commonly known as:  PRILOSEC Take 1 capsule (20 mg total) by mouth daily.   oxyCODONE 5 MG immediate release tablet Commonly known as:  Oxy IR/ROXICODONE Take 1-2 tablets (5-10 mg total) by mouth every 4 (four) hours as needed for severe pain or breakthrough pain.   rosuvastatin 40 MG tablet Commonly known as:  CRESTOR Take 1 tablet (40 mg total) by mouth daily.   traMADol 50 MG tablet Commonly known as:  ULTRAM Take 1-2 tablets (50-100 mg total) by mouth every 4 (four) hours as needed for moderate pain.            Durable Medical Equipment        Start     Ordered   09/19/16 1249  DME Walker rolling  Once     09/19/16 1249   09/19/16 1249  DME Bedside commode  Once     09/19/16 1249      Diagnostic Studies: Dg Knee Left Port  Result Date: 09/19/2016 CLINICAL DATA:  Knee replacement surgery EXAM: PORTABLE LEFT KNEE - 1-2 VIEW COMPARISON:  None. FINDINGS: Three components of left knee  arthroplasty project in expected location. Anterior surgical drains and skin staples. No fracture or dislocation. IMPRESSION: Left knee arthroplasty without apparent complication. Electronically Signed   By: Lucrezia Europe M.D.   On: 09/19/2016 11:28    Disposition:     Follow-up Information    Iram Lundberg R., PA On 10/04/2016.   Specialty:  Physician Assistant Why:  at 9:15am Contact information: [place] [address] [phone]        Dereck Leep, MD On 11/01/2016.   Specialty:  Orthopedic Surgery Why:  at 10:45am Contact information: [place] [address] [phone]        Watt Climes., PA On 10/04/2016.   Specialty:  Physician Assistant Why:  at 9:15am Contact information: [place] [address] [phone]        Dereck Leep, MD On 11/01/2016.   Specialty:  Orthopedic Surgery Why:  at 10:45am Contact information: [place] [address] [phone]            Signed: Watt Climes 09/20/2016, 7:51 AM

## 2016-09-20 NOTE — Progress Notes (Signed)
Clinical Social Worker (CSW) received SNF consult. PT is recommending home health. RN case manager is aware of above. Please reconsult if future social work needs arise. CSW signing off.   Johnnay Pleitez, LCSW [phone] 

## 2016-09-20 NOTE — Care Management Note (Signed)
Case Management Note  Patient Details  Name: Stuart Harvey MRN: [id number] Date of Birth: January 28, 1951  Subjective/Objective:      POD  # 1 S/P left TKA. Met with patient at bedside. Discussed home with home health. Patient lives at home with his wife. He prefers to use [facility]. Referral called to [facility]. Pharmacy [facility]: [phone]). Called Lovenox 40 mg # 14, no refills. Ordered a front wheeled walker from [facility].            Action/Plan: [facility] for HHPT. Lovenox called in   Expected Discharge Date:                  Expected Discharge Plan:  [place]  In-House Referral:     Discharge planning Services  CM Consult  Post Acute Care Choice:  Home Health Choice offered to:  Patient  DME Arranged:    DME Agency:  [facility] Arranged:  PT [facility] Agency:     Status of Service:  In process, will continue to follow  If discussed at Long Length of Stay Meetings, dates discussed:    Additional Comments:  Jolly Mango, RN 09/20/2016, 3:10 PM

## 2016-09-20 NOTE — Progress Notes (Signed)
Physical Therapy Treatment Patient Details Name: Stuart Harvey MRN: [id number] DOB: 11/18/1950 Today's Date: 09/20/2016    History of Present Illness Pt underwent L TKR and is POD#0 at time of initial evaluation. Pt has a history of R TKR approximately 2 years ago. No reported falls in the last 12 months.     PT Comments    Pt in chair ready to return to bed.  Participated in exercises as described below.  BP's taken with dynamap this session and were reading low again.  RN in and rook BP's manually and obtained 125/70 in sitting and 113/61 standing.  Pt with continued complains of dizziness but stated he was ok and wanted to ambulate.  He was able to walk 120' with walker and min guard and recliner follow for safety due to minimal dizziness.  He returned to bed with ease at end of session.     Follow Up Recommendations  Home health PT     Equipment Recommendations  Rolling walker with 5" wheels    Recommendations for Other Services       Precautions / Restrictions Precautions Precautions: Knee;Fall Restrictions Weight Bearing Restrictions: Yes LLE Weight Bearing: Weight bearing as tolerated    Mobility  Bed Mobility Overal bed mobility: Modified Independent Bed Mobility: Sit to Supine     Supine to sit: Supervision Sit to supine: Supervision      Transfers Overall transfer level: Needs assistance Equipment used: Rolling walker (2 wheeled) Transfers: Sit to/from Stand Sit to Stand: Min assist Stand pivot transfers: Min guard          Ambulation/Gait Ambulation/Gait assistance: Min guard Ambulation Distance (Feet): 120 Feet Assistive device: Rolling walker (2 wheeled) Gait Pattern/deviations: Step-to pattern;Step-through pattern Gait velocity: decreased Gait velocity interpretation: Below normal speed for age/gender General Gait Details: generally steady with good step length, encouraged inc knee flexion   Stairs            Wheelchair Mobility     Modified Rankin (Stroke Patients Only)       Balance Overall balance assessment: Needs assistance Sitting-balance support: Feet supported Sitting balance-Leahy Scale: Normal     Standing balance support: Bilateral upper extremity supported Standing balance-Leahy Scale: Fair                      Cognition Arousal/Alertness: Awake/alert Behavior During Therapy: WFL for tasks assessed/performed Overall Cognitive Status: Within Functional Limits for tasks assessed                      Exercises Total Joint Exercises Ankle Circles/Pumps: Strengthening;Both;10 reps;Supine Quad Sets: Strengthening;Both;10 reps;Supine Gluteal Sets: Strengthening;Both;10 reps;Supine Towel Squeeze: Strengthening;Both;10 reps;Supine Short Arc Quad: Strengthening;Left;10 reps;Supine Heel Slides: Strengthening;Left;10 reps;Supine Hip ABduction/ADduction: Strengthening;Left;10 reps;Supine Straight Leg Raises: Strengthening;Left;10 reps;Supine Long Arc Quad: AROM;Left;10 reps;Seated Knee Flexion: AAROM;Left;10 reps;Seated Goniometric ROM: 0-86 (limited by pain/banadaging)    General Comments        Pertinent Vitals/Pain Pain Assessment: 0-10 Pain Score: 4  Pain Location: L knee Pain Descriptors / Indicators: Aching Pain Intervention(s): Monitored during session;Ice applied    Home Living                      Prior Function            PT Goals (current goals can now be found in the care plan section) Progress towards PT goals: Progressing toward goals    Frequency    BID  PT Plan Current plan remains appropriate    Co-evaluation             End of Session Equipment Utilized During Treatment: Gait belt Activity Tolerance: Treatment limited secondary to medical complications (Comment);Other (comment) Patient left: in bed;with call bell/phone within reach;with bed alarm set     Time: [id number] PT Time Calculation (min) (ACUTE ONLY): 24  min  Charges:  $Gait Training: 8-22 mins $Therapeutic Exercise: 8-22 mins $Therapeutic Activity: 8-22 mins                    G Codes:      [name] 09/28/16, 12:04 PM

## 2016-09-20 NOTE — Anesthesia Postprocedure Evaluation (Signed)
Anesthesia Post Note  Patient: Stuart Harvey  Procedure(s) Performed: Procedure(s) (LRB): COMPUTER ASSISTED TOTAL KNEE ARTHROPLASTY (Left)  Patient location during evaluation: Nursing Unit Anesthesia Type: Spinal Level of consciousness: awake, awake and alert and oriented Pain management: pain level controlled Vital Signs Assessment: post-procedure vital signs reviewed and stable Respiratory status: nonlabored ventilation, spontaneous breathing and respiratory function stable Cardiovascular status: blood pressure returned to baseline and stable Postop Assessment: no headache and no backache Anesthetic complications: no    Last Vitals:  Vitals:   09/20/16 0359 09/20/16 0736  BP: 105/60 113/66  Pulse: 60 (!) 58  Resp: 18 18  Temp: 36.5 C 36.6 C    Last Pain:  Vitals:   09/20/16 0736  TempSrc: Oral  PainSc:                  Johnna Acosta

## 2016-09-20 NOTE — Discharge Instructions (Signed)

## 2016-09-21 LAB — BASIC METABOLIC PANEL
Anion gap: 6 (ref 5–15)
BUN: 26 mg/dL — AB (ref 6–20)
CALCIUM: 8.3 mg/dL — AB (ref 8.9–10.3)
CHLORIDE: 109 mmol/L (ref 101–111)
CO2: 25 mmol/L (ref 22–32)
CREATININE: 1.21 mg/dL (ref 0.61–1.24)
GFR calc Af Amer: >60 mL/min (ref >60)
GFR calc non Af Amer: >60 mL/min (ref >60)
GLUCOSE: 94 mg/dL (ref 65–99)
Potassium: 4.1 mmol/L (ref 3.5–5.1)
Sodium: 140 mmol/L (ref 135–145)

## 2016-09-21 LAB — CBC
HEMATOCRIT: 25.9 % — AB (ref 40.0–52.0)
HEMOGLOBIN: 8.5 g/dL — AB (ref 13.0–18.0)
MCH: 27.8 pg (ref 26.0–34.0)
MCHC: 32.6 g/dL (ref 32.0–36.0)
MCV: 85.1 fL (ref 80.0–100.0)
Platelets: 133 10*3/uL — ABNORMAL LOW (ref 150–440)
RBC: 3.04 MIL/uL — ABNORMAL LOW (ref 4.40–5.90)
RDW: 16.1 % — AB (ref 11.5–14.5)
WBC: 8.9 10*3/uL (ref 3.8–10.6)

## 2016-09-21 MED ORDER — LACTULOSE 10 GM/15ML PO SOLN
10.0000 g | Freq: Two times a day (BID) | ORAL | Status: DC | PRN
  Administered 2016-09-21: 10 g via ORAL
  Filled 2016-09-21: qty 30

## 2016-09-21 MED ORDER — TRAMADOL HCL 50 MG PO TABS: 50.0000 mg | ORAL_TABLET | ORAL | 0 refills | Status: DC | PRN

## 2016-09-21 MED ORDER — ENOXAPARIN SODIUM 40 MG/0.4ML ~~LOC~~ SOLN: 40.0000 mg | SUBCUTANEOUS | 0 refills | Status: DC

## 2016-09-21 MED ORDER — OXYCODONE HCL 5 MG PO TABS: 5.0000 mg | ORAL_TABLET | ORAL | 0 refills | Status: DC | PRN

## 2016-09-21 NOTE — Progress Notes (Signed)
Physical Therapy Treatment Patient Details Name: Stuart Harvey MRN: [id number] DOB: 05-Aug-1951 Today's Date: 09/21/2016    History of Present Illness Pt underwent L TKR and is POD#0 at time of initial evaluation. Pt has a history of R TKR approximately 2 years ago. No reported falls in the last 12 months.     PT Comments    Pt independent with bed mobility, modified independent with transfers, SBA with RW ambulating around nursing station, and CGA navigating step with RW to enter/exit home.  SBA given for ambulation d/t pt c/o mild dizziness when walking (BP 121/63 sitting; 104/68 standing; and 129/63 end of PT session: nursing notified).  Pt steady without loss of balance during session.  L knee ROM 0-95 degrees.  Pt appears safe in terms of functional mobility to discharge home with support of family when medically appropriate.   Follow Up Recommendations  Home health PT     Equipment Recommendations  Rolling walker with 5" wheels    Recommendations for Other Services       Precautions / Restrictions Precautions Precautions: Knee;Fall Precaution Booklet Issued: Yes (comment) Restrictions Weight Bearing Restrictions: Yes LLE Weight Bearing: Weight bearing as tolerated    Mobility  Bed Mobility Overal bed mobility: Independent Bed Mobility: Supine to Sit;Sit to Supine     Supine to sit: Independent Sit to supine: Independent   General bed mobility comments: no difficulties noted  Transfers Overall transfer level: Modified independent Equipment used: Rolling walker (2 wheeled) Transfers: Sit to/from [facility] Sit to Stand: Modified independent (Device/Increase time) Stand pivot transfers: Modified independent (Device/Increase time)       General transfer comment: steady without loss of balance; no vc's required  Ambulation/Gait Ambulation/Gait assistance: Supervision (SBA d/t c/o mild dizziness) Ambulation Distance (Feet):  (150 feet  x2) Assistive device: Rolling walker (2 wheeled) Gait Pattern/deviations: Step-through pattern Gait velocity: decreased mildly   General Gait Details: mild decreased stance time L LE   Stairs Stairs: Yes Stairs assistance: Min guard Stair Management: Step to pattern;Forwards;With walker Number of Stairs: 1 General stair comments: pt navigated 1 platform step up/down with RW; initial vc's and demo required for technique and then pt able to perform without further cueing  Wheelchair Mobility    Modified Rankin (Stroke Patients Only)       Balance Overall balance assessment: Needs assistance Sitting-balance support: No upper extremity supported;Feet supported Sitting balance-Leahy Scale: Normal     Standing balance support: No upper extremity supported Standing balance-Leahy Scale: Good Standing balance comment: standing balance without UE support washing hands at sink                    Cognition Arousal/Alertness: Awake/alert Behavior During Therapy: WFL for tasks assessed/performed Overall Cognitive Status: Within Functional Limits for tasks assessed                      Exercises Total Joint Exercises Long Arc Quad: AROM;Strengthening;Left;10 reps;Seated Knee Flexion: AROM;Strengthening;Left;10 reps;Seated Goniometric ROM: 0-95 degrees    General Comments  Pt agreeable to PT session.      Pertinent Vitals/Pain Pain Assessment: 0-10 Pain Score: 5  Pain Location: L knee Pain Descriptors / Indicators: Aching Pain Intervention(s): Limited activity within patient's tolerance;Monitored during session;Premedicated before session;Repositioned;Ice applied  HR and O2 vitals WFL during session.    Home Living                      Prior  Function            PT Goals (current goals can now be found in the care plan section) Acute Rehab PT Goals Patient Stated Goal: Return to prior level of function. Return to working on cars PT Goal  Formulation: With patient Time For Goal Achievement: 10/03/16 Potential to Achieve Goals: Good Progress towards PT goals: Progressing toward goals    Frequency    BID      PT Plan Current plan remains appropriate    Co-evaluation             End of Session Equipment Utilized During Treatment: Gait belt Activity Tolerance: Patient tolerated treatment well Patient left: in chair;with call bell/phone within reach;with chair alarm set;with SCD's reapplied (B heels elevated via towel rools; polar care in place and activated)     Time: [id number] PT Time Calculation (min) (ACUTE ONLY): 38 min  Charges:  $Gait Training: 8-22 mins $Therapeutic Exercise: 8-22 mins $Therapeutic Activity: 8-22 mins                    G CodesLeitha Bleak 20-Oct-2016, 11:58 AM Leitha Bleak, [place]

## 2016-09-21 NOTE — Progress Notes (Signed)
   Subjective: 2 Days Post-Op Procedure(s) (LRB): COMPUTER ASSISTED TOTAL KNEE ARTHROPLASTY (Left) Patient reports pain as 5 on 0-10 scale.   Patient is well, and has had no acute complaints or problems Continue with  Physical therapy today.  Plan is to go Home after hospital stay. no nausea and no vomiting Patient denies any chest pains or shortness of breath. Objective: Vital signs in last 24 hours: Temp:  [97.6 F (36.4 C)-97.9 F (36.6 C)] 97.6 F (36.4 C) (11/08 0349) Pulse Rate:  [58-65] 59 (11/08 0349) Resp:  [18] 18 (11/08 0349) BP: (113-128)/(66-76) 114/72 (11/08 0349) SpO2:  [97 %-99 %] 99 % (11/08 0349) well approximated incision Heels are non tender and elevated off the bed using rolled towels Intake/Output from previous day: 11/07 0701 - 11/08 0700 In: 480 [P.O.:480] Out: 371 [Urine:1; Drains:370] Intake/Output this shift: No intake/output data recorded.   Recent Labs  09/20/16 0339 09/21/16 0349  HGB 9.0* 8.5*    Recent Labs  09/20/16 0339 09/21/16 0349  WBC 11.2* 8.9  RBC 3.24* 3.04*  HCT 27.4* 25.9*  PLT 141* 133*    Recent Labs  09/20/16 0339 09/21/16 0349  NA 138 140  K 4.4 4.1  CL 110 109  CO2 25 25  BUN 27* 26*  CREATININE 1.07 1.21  GLUCOSE 141* 94  CALCIUM 8.4* 8.3*   No results for input(s): LABPT, INR in the last 72 hours.  EXAM General - Patient is Alert, Appropriate and Oriented Extremity - Neurologically intact Neurovascular intact Sensation intact distally Intact pulses distally Dorsiflexion/Plantar flexion intact No cellulitis present Compartment soft Dressing - dressing C/D/I Motor Function - intact, moving foot and toes well on exam.    Past Medical History:  Diagnosis Date  . AAA (abdominal aortic aneurysm) ([place])    s/p surgical repair in 08/2007 by Dr. Hulda Humphrey  . Arthritis    both knees  . Chronic kidney disease   . Coronary artery disease    CABG at [facility] in 06/2007 for 3 vessel CAD, LIMA to LAD, SVG to  RPDA, SVG to OM2 and SVG to D2.  Cath in 2011 showed patent grafts. EF 40%  . Depression   . Hernia of abdominal cavity   . History of heart attack    "silent heart attack"  . Hyperlipidemia   . Hypertension   . Hypothyroidism   . Myocardial infarction     Assessment/Plan: 2 Days Post-Op Procedure(s) (LRB): COMPUTER ASSISTED TOTAL KNEE ARTHROPLASTY (Left) Active Problems:   S/P total knee arthroplasty  Estimated body mass index is 29.02 kg/m as calculated from the following:   Height as of this encounter: 6' (1.829 m).   Weight as of this encounter: 97.1 kg (214 lb). Up with therapy Discharge home with home health  Labs: reviewed DVT Prophylaxis - Lovenox, Foot Pumps and TED hose Weight-Bearing as tolerated to left leg Hemovac d/c'd Lactulose added Needs a bowel movement today  Kiree Dejarnette R. Hoffman [place] 09/21/2016, 7:16 AM

## 2016-09-21 NOTE — Progress Notes (Addendum)
Patient was discharged home with wife. Reviewed Lovenox, wife was able to demo and discussed Lovenox kit. Reviewed last dose given, scripts and dressing care. Allowed time for questions. 2 IVs removed with cath intact.

## 2016-09-21 NOTE — Care Management Note (Signed)
Case Management Note  Patient Details  Name: Stuart Harvey MRN: [id number] Date of Birth: 09/12/1951  Subjective/Objective:   Discharging today                 Action/Plan: [facility] updated on discharge. Walker delivered. Cost of Lovenox is $ 60.00. Patient updated  Expected Discharge Date:    09/21/2016              Expected Discharge Plan:  [name]  In-House Referral:     Discharge planning Services  CM Consult  Post Acute Care Choice:  Home Health Choice offered to:  Patient  DME Arranged:    DME Agency:  [place]:  PT [place] Agency:  [facility] (now [facility])  Status of Service:  Completed, signed off  If discussed at [place] of Stay Meetings, dates discussed:    Additional Comments:  Jolly Mango, RN 09/21/2016, 9:09 AM

## 2016-09-22 DIAGNOSIS — I2510 Atherosclerotic heart disease of native coronary artery without angina pectoris: Secondary | ICD-10-CM | POA: Diagnosis not present

## 2016-09-22 DIAGNOSIS — Z471 Aftercare following joint replacement surgery: Secondary | ICD-10-CM | POA: Diagnosis not present

## 2016-09-22 DIAGNOSIS — N189 Chronic kidney disease, unspecified: Secondary | ICD-10-CM | POA: Diagnosis not present

## 2016-09-22 DIAGNOSIS — I129 Hypertensive chronic kidney disease with stage 1 through stage 4 chronic kidney disease, or unspecified chronic kidney disease: Secondary | ICD-10-CM | POA: Diagnosis not present

## 2016-09-22 DIAGNOSIS — R69 Illness, unspecified: Secondary | ICD-10-CM | POA: Diagnosis not present

## 2016-09-22 DIAGNOSIS — I252 Old myocardial infarction: Secondary | ICD-10-CM | POA: Diagnosis not present

## 2016-09-23 DIAGNOSIS — I252 Old myocardial infarction: Secondary | ICD-10-CM | POA: Diagnosis not present

## 2016-09-23 DIAGNOSIS — I2510 Atherosclerotic heart disease of native coronary artery without angina pectoris: Secondary | ICD-10-CM | POA: Diagnosis not present

## 2016-09-23 DIAGNOSIS — Z471 Aftercare following joint replacement surgery: Secondary | ICD-10-CM | POA: Diagnosis not present

## 2016-09-23 DIAGNOSIS — R69 Illness, unspecified: Secondary | ICD-10-CM | POA: Diagnosis not present

## 2016-09-23 DIAGNOSIS — N189 Chronic kidney disease, unspecified: Secondary | ICD-10-CM | POA: Diagnosis not present

## 2016-09-23 DIAGNOSIS — I129 Hypertensive chronic kidney disease with stage 1 through stage 4 chronic kidney disease, or unspecified chronic kidney disease: Secondary | ICD-10-CM | POA: Diagnosis not present

## 2016-09-25 DIAGNOSIS — I252 Old myocardial infarction: Secondary | ICD-10-CM | POA: Diagnosis not present

## 2016-09-25 DIAGNOSIS — N189 Chronic kidney disease, unspecified: Secondary | ICD-10-CM | POA: Diagnosis not present

## 2016-09-25 DIAGNOSIS — I129 Hypertensive chronic kidney disease with stage 1 through stage 4 chronic kidney disease, or unspecified chronic kidney disease: Secondary | ICD-10-CM | POA: Diagnosis not present

## 2016-09-25 DIAGNOSIS — Z471 Aftercare following joint replacement surgery: Secondary | ICD-10-CM | POA: Diagnosis not present

## 2016-09-25 DIAGNOSIS — R69 Illness, unspecified: Secondary | ICD-10-CM | POA: Diagnosis not present

## 2016-09-25 DIAGNOSIS — I2510 Atherosclerotic heart disease of native coronary artery without angina pectoris: Secondary | ICD-10-CM | POA: Diagnosis not present

## 2016-09-28 DIAGNOSIS — I2510 Atherosclerotic heart disease of native coronary artery without angina pectoris: Secondary | ICD-10-CM | POA: Diagnosis not present

## 2016-09-28 DIAGNOSIS — N189 Chronic kidney disease, unspecified: Secondary | ICD-10-CM | POA: Diagnosis not present

## 2016-09-28 DIAGNOSIS — R69 Illness, unspecified: Secondary | ICD-10-CM | POA: Diagnosis not present

## 2016-09-28 DIAGNOSIS — I129 Hypertensive chronic kidney disease with stage 1 through stage 4 chronic kidney disease, or unspecified chronic kidney disease: Secondary | ICD-10-CM | POA: Diagnosis not present

## 2016-09-28 DIAGNOSIS — Z471 Aftercare following joint replacement surgery: Secondary | ICD-10-CM | POA: Diagnosis not present

## 2016-09-28 DIAGNOSIS — I252 Old myocardial infarction: Secondary | ICD-10-CM | POA: Diagnosis not present

## 2016-09-29 DIAGNOSIS — R69 Illness, unspecified: Secondary | ICD-10-CM | POA: Diagnosis not present

## 2016-09-29 DIAGNOSIS — I252 Old myocardial infarction: Secondary | ICD-10-CM | POA: Diagnosis not present

## 2016-09-29 DIAGNOSIS — I129 Hypertensive chronic kidney disease with stage 1 through stage 4 chronic kidney disease, or unspecified chronic kidney disease: Secondary | ICD-10-CM | POA: Diagnosis not present

## 2016-09-29 DIAGNOSIS — I2510 Atherosclerotic heart disease of native coronary artery without angina pectoris: Secondary | ICD-10-CM | POA: Diagnosis not present

## 2016-09-29 DIAGNOSIS — N189 Chronic kidney disease, unspecified: Secondary | ICD-10-CM | POA: Diagnosis not present

## 2016-09-29 DIAGNOSIS — Z471 Aftercare following joint replacement surgery: Secondary | ICD-10-CM | POA: Diagnosis not present

## 2016-10-04 DIAGNOSIS — Z96652 Presence of left artificial knee joint: Secondary | ICD-10-CM | POA: Diagnosis not present

## 2016-10-04 DIAGNOSIS — M25562 Pain in left knee: Secondary | ICD-10-CM | POA: Diagnosis not present

## 2016-10-04 DIAGNOSIS — M25662 Stiffness of left knee, not elsewhere classified: Secondary | ICD-10-CM | POA: Diagnosis not present

## 2016-10-04 DIAGNOSIS — R29898 Other symptoms and signs involving the musculoskeletal system: Secondary | ICD-10-CM | POA: Diagnosis not present

## 2016-10-11 DIAGNOSIS — M25562 Pain in left knee: Secondary | ICD-10-CM | POA: Diagnosis not present

## 2016-10-13 DIAGNOSIS — M25562 Pain in left knee: Secondary | ICD-10-CM | POA: Diagnosis not present

## 2016-10-18 DIAGNOSIS — M25562 Pain in left knee: Secondary | ICD-10-CM | POA: Diagnosis not present

## 2016-10-20 DIAGNOSIS — M25562 Pain in left knee: Secondary | ICD-10-CM | POA: Diagnosis not present

## 2016-10-21 DIAGNOSIS — K219 Gastro-esophageal reflux disease without esophagitis: Secondary | ICD-10-CM | POA: Diagnosis not present

## 2016-10-21 DIAGNOSIS — I2510 Atherosclerotic heart disease of native coronary artery without angina pectoris: Secondary | ICD-10-CM | POA: Diagnosis not present

## 2016-10-21 DIAGNOSIS — E039 Hypothyroidism, unspecified: Secondary | ICD-10-CM | POA: Diagnosis not present

## 2016-10-21 DIAGNOSIS — Z6828 Body mass index (BMI) 28.0-28.9, adult: Secondary | ICD-10-CM | POA: Diagnosis not present

## 2016-10-21 DIAGNOSIS — R69 Illness, unspecified: Secondary | ICD-10-CM | POA: Diagnosis not present

## 2016-10-21 DIAGNOSIS — I10 Essential (primary) hypertension: Secondary | ICD-10-CM | POA: Diagnosis not present

## 2016-10-21 DIAGNOSIS — E785 Hyperlipidemia, unspecified: Secondary | ICD-10-CM | POA: Diagnosis not present

## 2016-10-21 DIAGNOSIS — Z0000 Encounter for general adult medical examination without abnormal findings: Secondary | ICD-10-CM | POA: Diagnosis not present

## 2016-10-25 DIAGNOSIS — M25562 Pain in left knee: Secondary | ICD-10-CM | POA: Diagnosis not present

## 2016-10-28 DIAGNOSIS — M25562 Pain in left knee: Secondary | ICD-10-CM | POA: Diagnosis not present

## 2016-11-01 DIAGNOSIS — M25562 Pain in left knee: Secondary | ICD-10-CM | POA: Diagnosis not present

## 2016-11-01 DIAGNOSIS — Z471 Aftercare following joint replacement surgery: Secondary | ICD-10-CM | POA: Diagnosis not present

## 2016-11-11 ENCOUNTER — Telehealth: Payer: Self-pay | Admitting: Urology

## 2016-11-11 NOTE — Telephone Encounter (Signed)
Patient is calling the office to schedule his renal ultrasound and 1 year follow up appointment.   Please place an order for renal ultrasound.

## 2016-12-06 DIAGNOSIS — H25813 Combined forms of age-related cataract, bilateral: Secondary | ICD-10-CM | POA: Diagnosis not present

## 2016-12-23 ENCOUNTER — Other Ambulatory Visit: Payer: Self-pay

## 2016-12-23 MED ORDER — LISINOPRIL 5 MG PO TABS: 5.0000 mg | ORAL_TABLET | Freq: Every day | ORAL | 0 refills | Status: DC

## 2016-12-23 MED ORDER — ROSUVASTATIN CALCIUM 40 MG PO TABS: 40.0000 mg | ORAL_TABLET | Freq: Every day | ORAL | 0 refills | Status: DC

## 2016-12-23 MED ORDER — LEVOTHYROXINE SODIUM 50 MCG PO TABS: 50.0000 ug | ORAL_TABLET | Freq: Every day | ORAL | 0 refills | Status: DC

## 2016-12-23 NOTE — Addendum Note (Signed)
Addended by: Kathrine Haddock on: 12/23/2016 08:24 AM   Modules accepted: Orders

## 2016-12-23 NOTE — Telephone Encounter (Signed)
Patient has 6 month f/up visit scheduled for 01/02/17.

## 2017-01-02 ENCOUNTER — Ambulatory Visit: Payer: Medicare HMO | Admitting: Unknown Physician Specialty

## 2017-01-02 ENCOUNTER — Encounter: Payer: Self-pay | Admitting: Unknown Physician Specialty

## 2017-01-02 ENCOUNTER — Ambulatory Visit (INDEPENDENT_AMBULATORY_CARE_PROVIDER_SITE_OTHER): Payer: Medicare HMO | Admitting: Unknown Physician Specialty

## 2017-01-02 DIAGNOSIS — E785 Hyperlipidemia, unspecified: Secondary | ICD-10-CM | POA: Diagnosis not present

## 2017-01-02 DIAGNOSIS — Z7189 Other specified counseling: Secondary | ICD-10-CM | POA: Diagnosis not present

## 2017-01-02 DIAGNOSIS — I10 Essential (primary) hypertension: Secondary | ICD-10-CM

## 2017-01-02 DIAGNOSIS — F329 Major depressive disorder, single episode, unspecified: Secondary | ICD-10-CM | POA: Diagnosis not present

## 2017-01-02 DIAGNOSIS — R69 Illness, unspecified: Secondary | ICD-10-CM | POA: Diagnosis not present

## 2017-01-02 DIAGNOSIS — F32A Depression, unspecified: Secondary | ICD-10-CM

## 2017-01-02 NOTE — Progress Notes (Signed)
BP 115/70 (BP Location: Left Arm, Patient Position: Sitting, Cuff Size: Large)   Pulse 69   Temp 98 F (36.7 C)   Ht 5' 10.9" (1.801 m)   Wt 218 lb 9.6 oz (99.2 kg)   SpO2 97%   BMI 30.57 kg/m    Subjective:    Patient ID: Stuart Harvey, male    DOB: Feb 05, 1951, 66 y.o.   MRN: [id number]  HPI: RICK WILTSE is a 66 y.o. male  Chief Complaint  Patient presents with  . Hyperlipidemia  . Hypertension  . Depression   Hypertension Using medications without difficulty Average home BPsNot checking  No problems or lightheadedness No chest pain with exertion or shortness of breath No Edema   Hyperlipidemia Using medications without problems: No Muscle aches  Diet compliance:Watches what he eats Exercise: regular walking  Depression Stable Depression screen [facility] 2/9 07/01/2016 12/18/2015 06/16/2015 06/16/2015  Decreased Interest 0 0 0 0  Down, Depressed, Hopeless 0 0 0 0  PHQ - 2 Score 0 0 0 0    Relevant past medical, surgical, family and social history reviewed and updated as indicated. Interim medical history since our last visit reviewed. Allergies and medications reviewed and updated.  Review of Systems  Per HPI unless specifically indicated above     Objective:    BP 115/70 (BP Location: Left Arm, Patient Position: Sitting, Cuff Size: Large)   Pulse 69   Temp 98 F (36.7 C)   Ht 5' 10.9" (1.801 m)   Wt 218 lb 9.6 oz (99.2 kg)   SpO2 97%   BMI 30.57 kg/m   Wt Readings from Last 3 Encounters:  01/02/17 218 lb 9.6 oz (99.2 kg)  09/19/16 214 lb (97.1 kg)  09/06/16 212 lb (96.2 kg)    Physical Exam  Constitutional: He is oriented to person, place, and time. He appears well-developed and well-nourished. No distress.  HENT:  Head: Normocephalic and atraumatic.  Eyes: Conjunctivae and lids are normal. Right eye exhibits no discharge. Left eye exhibits no discharge. No scleral icterus.  Neck: Normal range of motion. Neck supple. No JVD present. Carotid bruit  is not present.  Cardiovascular: Normal rate, regular rhythm and normal heart sounds.   Pulmonary/Chest: Effort normal and breath sounds normal. No respiratory distress.  Abdominal: Normal appearance. There is no splenomegaly or hepatomegaly.  Musculoskeletal: Normal range of motion.  Neurological: He is alert and oriented to person, place, and time.  Skin: Skin is warm, dry and intact. No rash noted. No pallor.  Psychiatric: He has a normal mood and affect. His behavior is normal. Judgment and thought content normal.    Results for orders placed or performed during the hospital encounter of 09/19/16  CBC  Result Value Ref Range   WBC 11.2 (H) 3.8 - 10.6 K/uL   RBC 3.24 (L) 4.40 - 5.90 MIL/uL   Hemoglobin 9.0 (L) 13.0 - 18.0 g/dL   HCT 27.4 (L) 40.0 - 52.0 %   MCV 84.4 80.0 - 100.0 fL   MCH 27.7 26.0 - 34.0 pg   MCHC 32.8 32.0 - 36.0 g/dL   RDW 15.5 (H) 11.5 - 14.5 %   Platelets 141 (L) 150 - 440 K/uL  Basic metabolic panel  Result Value Ref Range   Sodium 138 135 - 145 mmol/L   Potassium 4.4 3.5 - 5.1 mmol/L   Chloride 110 101 - 111 mmol/L   CO2 25 22 - 32 mmol/L   Glucose, Bld 141 (H) 65 -  99 mg/dL   BUN 27 (H) 6 - 20 mg/dL   Creatinine, Ser 1.07 0.61 - 1.24 mg/dL   Calcium 8.4 (L) 8.9 - 10.3 mg/dL   GFR calc non Af Amer >60 >60 mL/min   GFR calc Af Amer >60 >60 mL/min   Anion gap 3 (L) 5 - 15  CBC  Result Value Ref Range   WBC 8.9 3.8 - 10.6 K/uL   RBC 3.04 (L) 4.40 - 5.90 MIL/uL   Hemoglobin 8.5 (L) 13.0 - 18.0 g/dL   HCT 25.9 (L) 40.0 - 52.0 %   MCV 85.1 80.0 - 100.0 fL   MCH 27.8 26.0 - 34.0 pg   MCHC 32.6 32.0 - 36.0 g/dL   RDW 16.1 (H) 11.5 - 14.5 %   Platelets 133 (L) 150 - 440 K/uL  Basic metabolic panel  Result Value Ref Range   Sodium 140 135 - 145 mmol/L   Potassium 4.1 3.5 - 5.1 mmol/L   Chloride 109 101 - 111 mmol/L   CO2 25 22 - 32 mmol/L   Glucose, Bld 94 65 - 99 mg/dL   BUN 26 (H) 6 - 20 mg/dL   Creatinine, Ser 1.21 0.61 - 1.24 mg/dL   Calcium  8.3 (L) 8.9 - 10.3 mg/dL   GFR calc non Af Amer >60 >60 mL/min   GFR calc Af Amer >60 >60 mL/min   Anion gap 6 5 - 15      Assessment & Plan:   Problem List Items Addressed This Visit      Unprioritized   Counseling regarding advanced directives and goals of care    Has living will and health care power of attorney.  He does not want to make any changes.  He will bring in a copy      Depression    Stable, continue present medications.        Hyperlipidemia    Stable, continue present medications.        Hypertension    Stable, continue present medications.  Labs done at hospital recentlsy           Follow up plan: Return in about 6 months (around 07/02/2017) for Medicare physical.

## 2017-01-02 NOTE — Assessment & Plan Note (Signed)
Has living will and health care power of attorney.  He does not want to make any changes.  He will bring in a copy

## 2017-01-03 NOTE — Assessment & Plan Note (Signed)
Stable, continue present medications.   

## 2017-01-03 NOTE — Assessment & Plan Note (Addendum)
Stable, continue present medications.  Labs done at hospital recentlsy

## 2017-01-06 ENCOUNTER — Other Ambulatory Visit: Payer: Self-pay

## 2017-01-06 MED ORDER — METOPROLOL TARTRATE 25 MG PO TABS: 12.5000 mg | ORAL_TABLET | Freq: Two times a day (BID) | ORAL | 3 refills | Status: DC

## 2017-01-06 MED ORDER — BUPROPION HCL ER (SR) 200 MG PO TB12: 200.0000 mg | ORAL_TABLET | Freq: Two times a day (BID) | ORAL | 3 refills | Status: DC

## 2017-01-06 MED ORDER — CITALOPRAM HYDROBROMIDE 40 MG PO TABS: 40.0000 mg | ORAL_TABLET | Freq: Every day | ORAL | 3 refills | Status: DC

## 2017-01-31 ENCOUNTER — Encounter: Payer: Self-pay | Admitting: Cardiovascular Disease

## 2017-01-31 ENCOUNTER — Ambulatory Visit (INDEPENDENT_AMBULATORY_CARE_PROVIDER_SITE_OTHER): Payer: Medicare HMO | Admitting: Cardiovascular Disease

## 2017-01-31 VITALS — BP 110/70 | HR 65 | Ht 72.0 in | Wt 215.0 lb

## 2017-01-31 DIAGNOSIS — I714 Abdominal aortic aneurysm, without rupture, unspecified: Secondary | ICD-10-CM

## 2017-01-31 DIAGNOSIS — I2510 Atherosclerotic heart disease of native coronary artery without angina pectoris: Secondary | ICD-10-CM | POA: Diagnosis not present

## 2017-01-31 DIAGNOSIS — I10 Essential (primary) hypertension: Secondary | ICD-10-CM

## 2017-01-31 DIAGNOSIS — E785 Hyperlipidemia, unspecified: Secondary | ICD-10-CM

## 2017-01-31 NOTE — Progress Notes (Signed)
Cardiology Office Note   Date:  01/31/2017   ID:  Stuart Harvey, DOB 08-Nov-1951, MRN [id number]  PCP:  Kathrine Haddock, NP  Cardiologist:   [name], MD   Chief Complaint  Patient presents with  . OTHER    1 yr f/u no complaints. Meds reviewed verbally with pt.      History of Present Illness: Stuart Harvey is a 66 y.o. male who presents fora followup visit regarding coronary artery disease.  He has known history of severe three-vessel coronary artery disease diagnosed in 2008 as well as a large infrarenal abdominal aortic aneurysm. He underwent coronary artery bypass graft surgery in August 2008 at [facility]. He underwent open surgical repair of aortic aneurysm in October of 2008. No cardiac events since then.  He had an abnormal stress test in 2011. Cardiac catheterization showed patent grafts with an ejection fraction of 40%. Most recent stress test was in 2013 which showed evidence of prior inferolateral infarct with some peri-infarct ischemia and normal ejection fraction. This was compared to his previous stress testing in 2011 and basically was unchanged. Aortic aneurysm followup is done  by Dr. Lucky Cowboy.  Echocardiogram in October 2016 showed an ejection fraction of 50-55% with no significant valvular abnormalities.  He had prior hernia surgery, right knee replacement and most recently left knee replacement. He has been doing well from a cardiac standpoint with no chest pain or worsening dyspnea. He has trace leg edema. No palpitations or syncope. He is compliant with his medications.  Past Medical History:  Diagnosis Date  . AAA (abdominal aortic aneurysm) ([place])    s/p surgical repair in 08/2007 by Dr. Hulda Humphrey  . Arthritis    both knees  . Chronic kidney disease   . Coronary artery disease    CABG at [facility] in 06/2007 for 3 vessel CAD, LIMA to LAD, SVG to RPDA, SVG to OM2 and SVG to D2.  Cath in 2011 showed patent grafts. EF 40%  . Depression   . Hernia of abdominal cavity     . History of heart attack    "silent heart attack"  . Hyperlipidemia   . Hypertension   . Hypothyroidism   . Myocardial infarction     Past Surgical History:  Procedure Laterality Date  . ABDOMINAL AORTIC ANEURYSM REPAIR  08/2007  . ABDOMINAL AORTIC ANEURYSM REPAIR  11/2012   [facility]  . CARDIAC CATHETERIZATION  2008, 2011   [facility]   . CORONARY ARTERY BYPASS GRAFT  2008  . EXPLORATORY LAPAROTOMY  1997  . HERNIA REPAIR    . JOINT REPLACEMENT     right knee  . KNEE ARTHROPLASTY Left 09/19/2016   Procedure: COMPUTER ASSISTED TOTAL KNEE ARTHROPLASTY;  Surgeon: Dereck Leep, MD;  Location: [facility] ORS;  Service: Orthopedics;  Laterality: Left;  . TOTAL KNEE ARTHROPLASTY Right      Current Outpatient Prescriptions  Medication Sig Dispense Refill  . aspirin 81 MG tablet Take 81 mg by mouth daily.    Marland Kitchen buPROPion (WELLBUTRIN SR) 200 MG 12 hr tablet Take 1 tablet (200 mg total) by mouth 2 (two) times daily. 180 tablet 3  . Cholecalciferol (D3 SUPER STRENGTH) 2000 UNITS CAPS Take 1 capsule by mouth daily.     . citalopram (CELEXA) 40 MG tablet Take 1 tablet (40 mg total) by mouth daily. 90 tablet 3  . levothyroxine (SYNTHROID, LEVOTHROID) 50 MCG tablet Take 1 tablet (50 mcg total) by mouth daily. 90 tablet 0  .  lisinopril (PRINIVIL,ZESTRIL) 5 MG tablet Take 1 tablet (5 mg total) by mouth daily. 90 tablet 0  . metoprolol tartrate (LOPRESSOR) 25 MG tablet Take 0.5 tablets (12.5 mg total) by mouth 2 (two) times daily. 90 tablet 3  . omeprazole (PRILOSEC) 20 MG capsule Take 1 capsule (20 mg total) by mouth daily. 90 capsule 3  . rosuvastatin (CRESTOR) 40 MG tablet Take 1 tablet (40 mg total) by mouth daily. 90 tablet 0   No current facility-administered medications for this visit.     Allergies:   Codeine and Tetracycline    Social History:  The patient  reports that he quit smoking about 11 years ago. His smoking use included Cigarettes. He has a 35.00 pack-year smoking history. His smokeless  tobacco use includes Chew. He reports that he does not drink alcohol or use drugs.   Family History:  The patient's family history includes Depression in his maternal grandfather and maternal grandmother; Heart attack in his father and mother; Heart disease in his mother; Hypertension in his father and mother; Kidney disease in his brother.    ROS:  Please see the history of present illness.   Otherwise, review of systems are positive for none.   All other systems are reviewed and negative.    PHYSICAL EXAM: VS:  BP 110/70 (BP Location: Left Arm, Patient Position: Sitting, Cuff Size: Normal)   Pulse 65   Ht 6' (1.829 m)   Wt 215 lb (97.5 kg)   BMI 29.16 kg/m  , BMI Body mass index is 29.16 kg/m. GEN: Well nourished, well developed, in no acute distress  HEENT: normal  Neck: no JVD, carotid bruits, or masses Cardiac: RRR; no murmurs, rubs, or gallops,no edema  Respiratory:  clear to auscultation bilaterally, normal work of breathing GI: soft, nontender, nondistended, + BS MS: no deformity or atrophy  Skin: warm and dry, no rash Neuro:  Strength and sensation are intact Psych: euthymic mood, full affect   EKG:  EKG is ordered today. The ekg ordered today demonstrates normal sinus rhythm, left anterior fascicular block, ST and T wave abnormalities suggestive of anterolateral ischemia. One PVC noted.   Recent Labs: 07/01/2016: TSH 2.900 09/06/2016: ALT 16 09/21/2016: BUN 26; Creatinine, Ser 1.21; Hemoglobin 8.5; Platelets 133; Potassium 4.1; Sodium 140    Lipid Panel    Component Value Date/Time   CHOL 154 07/01/2016 1348   CHOL 159 12/18/2015 0827   TRIG 126 07/01/2016 1348   TRIG 166 (H) 12/18/2015 0827   HDL 47 07/01/2016 1348   VLDL 33 (H) 12/18/2015 0827   LDLCALC 82 07/01/2016 1348      Wt Readings from Last 3 Encounters:  01/31/17 215 lb (97.5 kg)  01/02/17 218 lb 9.6 oz (99.2 kg)  09/19/16 214 lb (97.1 kg)       ASSESSMENT AND PLAN:  1.  Coronary artery  disease involving native coronary arteries without angina: He is doing well overall. Most recent ejection fraction was 50-55% on echocardiogram.  Continue medical therapy.  2. Essential hypertension:  Blood pressure is well controlled on current medications.  3. Hyperlipidemia: Continue treatment with high-dose rosuvastatin.  I reviewed most recent lipid profile from August 2017 which showed an LDL of 82 and triglycerides of 126. LDL is close to target of less than 70.  4. Abdominal aortic aneurysm: Status post surgical repair. This is followed annually by Dr. Lucky Cowboy.   Disposition:   FU with me in 1 year  Signed,  [name], MD  01/31/2017 2:31 PM    [facility]

## 2017-01-31 NOTE — Patient Instructions (Signed)
Medication Instructions: Continue same medications.   Labwork: None.   Procedures/Testing: None.   Follow-Up: 1 year with Dr. Zaheer Wageman.   Any Additional Special Instructions Will Be Listed Below (If Applicable).     If you need a refill on your cardiac medications before your next appointment, please call your pharmacy.   

## 2017-02-03 ENCOUNTER — Telehealth (INDEPENDENT_AMBULATORY_CARE_PROVIDER_SITE_OTHER): Payer: Self-pay | Admitting: Vascular Surgery

## 2017-02-03 NOTE — Telephone Encounter (Signed)
Patient will go to the ED  To be evaluated for a DVT.

## 2017-02-03 NOTE — Telephone Encounter (Signed)
Patient called and stated that he is having pain in his lower left leg and some swelling around the ankle. He would like to see Dew today if possible. Please advise

## 2017-02-03 NOTE — Telephone Encounter (Signed)
Pt lvm and stated he haven't heard from anyone and he need to possibly come in today.

## 2017-02-07 ENCOUNTER — Other Ambulatory Visit: Payer: Self-pay | Admitting: Orthopedic Surgery

## 2017-02-07 ENCOUNTER — Ambulatory Visit
Admission: RE | Admit: 2017-02-07 | Discharge: 2017-02-07 | Disposition: A | Discharge status: Home / Self Care | Payer: Medicare HMO | Source: Ambulatory Visit | Attending: Unknown Physician Specialty | Admitting: Unknown Physician Specialty

## 2017-02-07 ENCOUNTER — Other Ambulatory Visit: Payer: Self-pay | Admitting: Rheumatology

## 2017-02-07 DIAGNOSIS — R600 Localized edema: Secondary | ICD-10-CM | POA: Diagnosis not present

## 2017-02-07 DIAGNOSIS — R609 Edema, unspecified: Secondary | ICD-10-CM

## 2017-02-07 DIAGNOSIS — M79662 Pain in left lower leg: Secondary | ICD-10-CM | POA: Diagnosis not present

## 2017-02-07 DIAGNOSIS — M79605 Pain in left leg: Secondary | ICD-10-CM | POA: Diagnosis not present

## 2017-02-07 DIAGNOSIS — Z96652 Presence of left artificial knee joint: Secondary | ICD-10-CM | POA: Diagnosis not present

## 2017-02-10 ENCOUNTER — Other Ambulatory Visit: Payer: Self-pay

## 2017-02-10 MED ORDER — OMEPRAZOLE 20 MG PO CPDR: 20.0000 mg | DELAYED_RELEASE_CAPSULE | Freq: Every day | ORAL | 3 refills | Status: DC

## 2017-03-01 ENCOUNTER — Other Ambulatory Visit: Payer: Self-pay | Admitting: Nephrology

## 2017-03-01 DIAGNOSIS — N2889 Other specified disorders of kidney and ureter: Secondary | ICD-10-CM | POA: Diagnosis not present

## 2017-03-01 DIAGNOSIS — I10 Essential (primary) hypertension: Secondary | ICD-10-CM | POA: Diagnosis not present

## 2017-03-01 DIAGNOSIS — N183 Chronic kidney disease, stage 3 (moderate): Secondary | ICD-10-CM | POA: Diagnosis not present

## 2017-03-03 ENCOUNTER — Ambulatory Visit
Admission: RE | Admit: 2017-03-03 | Discharge: 2017-03-03 | Disposition: A | Discharge status: Home / Self Care | Payer: Medicare HMO | Source: Ambulatory Visit | Attending: Nephrology | Admitting: Nephrology

## 2017-03-03 DIAGNOSIS — N281 Cyst of kidney, acquired: Secondary | ICD-10-CM | POA: Diagnosis not present

## 2017-03-03 DIAGNOSIS — N2889 Other specified disorders of kidney and ureter: Secondary | ICD-10-CM

## 2017-03-20 ENCOUNTER — Other Ambulatory Visit: Payer: Self-pay

## 2017-03-20 MED ORDER — ROSUVASTATIN CALCIUM 40 MG PO TABS: 40.0000 mg | ORAL_TABLET | Freq: Every day | ORAL | 0 refills | Status: DC

## 2017-03-28 ENCOUNTER — Other Ambulatory Visit: Payer: Self-pay

## 2017-03-28 MED ORDER — LEVOTHYROXINE SODIUM 50 MCG PO TABS: 50.0000 ug | ORAL_TABLET | Freq: Every day | ORAL | 0 refills | Status: DC

## 2017-03-28 MED ORDER — LISINOPRIL 5 MG PO TABS: 5.0000 mg | ORAL_TABLET | Freq: Every day | ORAL | 0 refills | Status: DC

## 2017-06-12 ENCOUNTER — Other Ambulatory Visit (INDEPENDENT_AMBULATORY_CARE_PROVIDER_SITE_OTHER): Payer: Self-pay | Admitting: Vascular Surgery

## 2017-06-12 DIAGNOSIS — I714 Abdominal aortic aneurysm, without rupture, unspecified: Secondary | ICD-10-CM

## 2017-06-13 ENCOUNTER — Ambulatory Visit (INDEPENDENT_AMBULATORY_CARE_PROVIDER_SITE_OTHER): Payer: Medicare HMO | Admitting: Vascular Surgery

## 2017-06-13 ENCOUNTER — Encounter (INDEPENDENT_AMBULATORY_CARE_PROVIDER_SITE_OTHER): Payer: Self-pay | Admitting: Vascular Surgery

## 2017-06-13 ENCOUNTER — Ambulatory Visit (INDEPENDENT_AMBULATORY_CARE_PROVIDER_SITE_OTHER): Payer: Medicare HMO

## 2017-06-13 VITALS — BP 123/72 | HR 66 | Resp 17 | Ht 72.0 in | Wt 208.0 lb

## 2017-06-13 DIAGNOSIS — I10 Essential (primary) hypertension: Secondary | ICD-10-CM | POA: Diagnosis not present

## 2017-06-13 DIAGNOSIS — I714 Abdominal aortic aneurysm, without rupture, unspecified: Secondary | ICD-10-CM

## 2017-06-13 DIAGNOSIS — I8311 Varicose veins of right lower extremity with inflammation: Secondary | ICD-10-CM

## 2017-06-13 DIAGNOSIS — I8312 Varicose veins of left lower extremity with inflammation: Secondary | ICD-10-CM

## 2017-06-13 NOTE — Assessment & Plan Note (Signed)
blood pressure control important in reducing the progression of atherosclerotic disease. On appropriate oral medications.  

## 2017-06-13 NOTE — Progress Notes (Signed)
MRN : [id number]  Stuart Harvey is a 66 y.o. (July 29, 1951) male who presents with chief complaint of  Chief Complaint  Patient presents with  . Re-evaluation    1 year EVAR u/s follow up  .  History of Present Illness: Patient returns today in follow up of AAA. He is doing well without complaints today. He denies aneurysm related symptoms. Specifically, the patient denies new back or abdominal pain, or signs of peripheral embolization. His original aneurysm repair was in 2008 and he had an endoleak type I repair in 2014. His duplex today shows continued shrinkage of the aortic sac now measuring only 3.5 cm in maximal diameter without endoleak present.   Current Outpatient Prescriptions  Medication Sig Dispense Refill  . aspirin 81 MG tablet Take 81 mg by mouth daily.    Marland Kitchen buPROPion (WELLBUTRIN SR) 200 MG 12 hr tablet Take 1 tablet (200 mg total) by mouth 2 (two) times daily. 180 tablet 3  . Cholecalciferol (D3 SUPER STRENGTH) 2000 UNITS CAPS Take 1 capsule by mouth daily.     . citalopram (CELEXA) 40 MG tablet Take 1 tablet (40 mg total) by mouth daily. 90 tablet 3  . gabapentin (NEURONTIN) 300 MG capsule Take by mouth.    . levothyroxine (SYNTHROID, LEVOTHROID) 50 MCG tablet Take 1 tablet (50 mcg total) by mouth daily. 90 tablet 0  . lisinopril (PRINIVIL,ZESTRIL) 5 MG tablet Take 1 tablet (5 mg total) by mouth daily. 90 tablet 0  . metoprolol tartrate (LOPRESSOR) 25 MG tablet Take 0.5 tablets (12.5 mg total) by mouth 2 (two) times daily. 90 tablet 3  . omeprazole (PRILOSEC) 20 MG capsule Take 1 capsule (20 mg total) by mouth daily. 90 capsule 3  . rosuvastatin (CRESTOR) 40 MG tablet Take 1 tablet (40 mg total) by mouth daily. 90 tablet 0  . traMADol (ULTRAM) 50 MG tablet Take by mouth.     No current facility-administered medications for this visit.     Past Medical History:  Diagnosis Date  . AAA (abdominal aortic aneurysm) ([place])    s/p surgical repair in 08/2007 by Dr. Hulda Humphrey    . Arthritis    both knees  . Chronic kidney disease   . Coronary artery disease    CABG at [facility] in 06/2007 for 3 vessel CAD, LIMA to LAD, SVG to RPDA, SVG to OM2 and SVG to D2.  Cath in 2011 showed patent grafts. EF 40%  . Depression   . Hernia of abdominal cavity   . History of heart attack    "silent heart attack"  . Hyperlipidemia   . Hypertension   . Hypothyroidism   . Myocardial infarction [facility])     Past Surgical History:  Procedure Laterality Date  . ABDOMINAL AORTIC ANEURYSM REPAIR  08/2007  . ABDOMINAL AORTIC ANEURYSM REPAIR  11/2012   [facility]  . CARDIAC CATHETERIZATION  2008, 2011   [facility]   . CORONARY ARTERY BYPASS GRAFT  2008  . EXPLORATORY LAPAROTOMY  1997  . HERNIA REPAIR    . JOINT REPLACEMENT     right knee  . KNEE ARTHROPLASTY Left 09/19/2016   Procedure: COMPUTER ASSISTED TOTAL KNEE ARTHROPLASTY;  Surgeon: Dereck Leep, MD;  Location: [facility] ORS;  Service: Orthopedics;  Laterality: Left;  . TOTAL KNEE ARTHROPLASTY Right     Social History Social History  Substance Use Topics  . Smoking status: Former Smoker    Packs/day: 1.00    Years: 35.00    Types:  Cigarettes    Quit date: 12/16/2005  . Smokeless tobacco: Current User    Types: Chew     Comment: quit 2008  . Alcohol use No    Family History Family History  Problem Relation Age of Onset  . Heart disease Mother   . Heart attack Mother   . Hypertension Mother   . Heart attack Father   . Hypertension Father   . Kidney disease Brother   . Depression Maternal Grandmother   . Depression Maternal Grandfather   . Heart disease Unknown   . Heart attack Unknown   . Prostate cancer Neg Hx   . Kidney cancer Neg Hx     Allergies  Allergen Reactions  . Codeine Nausea Only  . Tetracycline Rash     REVIEW OF SYSTEMS (Negative unless checked)  Constitutional: [] Weight loss  [] Fever  [] Chills Cardiac: [] Chest pain   [] Chest pressure   [] Palpitations   [] Shortness of breath when laying flat    [] Shortness of breath at rest   [] Shortness of breath with exertion. Vascular:  [] Pain in legs with walking   [] Pain in legs at rest   [] Pain in legs when laying flat   [] Claudication   [] Pain in feet when walking  [] Pain in feet at rest  [] Pain in feet when laying flat   [] History of DVT   [] Phlebitis   [] Swelling in legs   [] Varicose veins   [] Non-healing ulcers Pulmonary:   [] Uses home oxygen   [] Productive cough   [] Hemoptysis   [] Wheeze  [] COPD   [] Asthma Neurologic:  [] Dizziness  [] Blackouts   [] Seizures   [] History of stroke   [] History of TIA  [] Aphasia   [] Temporary blindness   [] Dysphagia   [] Weakness or numbness in arms   [] Weakness or numbness in legs Musculoskeletal:  [] Arthritis   [] Joint swelling   [] Joint pain   [] Low back pain Hematologic:  [] Easy bruising  [] Easy bleeding   [] Hypercoagulable state   [] Anemic   Gastrointestinal:  [] Blood in stool   [] Vomiting blood  [] Gastroesophageal reflux/heartburn   [] Abdominal pain Genitourinary:  [] Chronic kidney disease   [] Difficult urination  [] Frequent urination  [] Burning with urination   [] Hematuria Skin:  [] Rashes   [] Ulcers   [] Wounds Psychological:  [] History of anxiety   []  History of major depression.  Physical Examination  BP 123/72 (BP Location: Right Arm)   Pulse 66   Resp 17   Ht 6' (1.829 m)   Wt 208 lb (94.3 kg)   BMI 28.21 kg/m  Gen:  WD/WN, NAD Head: [place]/AT, No temporalis wasting. Ear/Nose/Throat: Hearing grossly intact, nares w/o erythema or drainage, trachea midline Eyes: Conjunctiva clear. Sclera non-icteric Neck: Supple.  No JVD.  Pulmonary:  Good air movement, no use of accessory muscles.  Cardiac: RRR, normal S1, S2 Vascular:  Vessel Right Left  Radial Palpable Palpable                                   Gastrointestinal: soft, non-tender/non-distended. No Increased aortic impulse Musculoskeletal: M/S 5/5 throughout.  No deformity or atrophy. 1+ right lower extremity edema, trace to 1+ left lower  extremity edema. Prominent varicose veins are present bilaterally Neurologic: Sensation grossly intact in extremities.  Symmetrical.  Speech is fluent.  Psychiatric: Judgment intact, Mood & affect appropriate for pt's clinical situation. Dermatologic: No rashes or ulcers noted.  No cellulitis or open wounds.  Labs No results found for this or any previous visit (from the past 2160 hour(s)).  Radiology No results found.    Assessment/Plan  Hypertension blood pressure control important in reducing the progression of atherosclerotic disease. On appropriate oral medications.   Varicose veins of both lower extremities with inflammation Symptoms currently reasonably mild and not requiring intervention. If these get worse, return to clinic for further evaluation  AAA (abdominal aortic aneurysm) His duplex today shows continued shrinkage of the aortic sac now measuring only 3.5 cm in maximal diameter without endoleak present.  He is doing well and we will continue to follow this on an annual basis. Contact our office with any problems in the interim.    Leotis Pain, MD  06/13/2017 11:05 AM    This note was created with Dragon medical transcription system.  Any errors from dictation are purely unintentional

## 2017-06-13 NOTE — Patient Instructions (Signed)
Abdominal Aortic Aneurysm Endograft Repair Abdominal aortic aneurysm endograft repair is a surgery to fix an aortic aneurysm in the abdominal area. An aneurysm is a weak or damaged part of an artery wall that bulges out from the normal force of blood pumping through the body. An abdominal aortic aneurysm is an aneurysm that happens in the lower part of the aorta, which is the main artery of the body. The repair is often done if the aneurysm gets so large that it might burst (rupture). A ruptured aneurysm would cause bleeding inside the body that could put a person's life in danger. Before that happens, this procedure is needed to fix the problem. The procedure may also be done if the aneurysm causes symptoms such as pain in the back, abdomen, or side. In this procedure, a tube made of fabric and metal mesh (endograft or stent-graft) is placed in the weak part of the aorta to repair it. Tell a health care provider about:  Any allergies you have.  All medicines you are taking, including vitamins, herbs, eye drops, creams, and over-the-counter medicines.  Any problems you or family members have had with anesthetic medicines.  Any blood disorders you have.  Any surgeries you have had.  Any medical conditions you have.  Whether you are pregnant or may be pregnant. What are the risks? Generally, this is a safe procedure. However, problems may occur, including:  Infection of the graft or incision area.  Bleeding during the procedure or from the incision site.  Allergic reactions to medicines.  Damage to other structures or organs.  Blood leaking out around the endograft.  The endograft moving from where it was placed during surgery.  Blood flow through the graft becoming blocked.  Blood clots.  Kidney problems.  Blood flow to the legs becoming blocked (rare).  Rupture of the aorta even after the endograft repair is a success (rare).  What happens before the procedure? Staying  hydrated Follow instructions from your health care provider about hydration, which may include:  Up to 2 hours before the procedure - you may continue to drink clear liquids, such as water, clear fruit juice, black coffee, and plain tea.  Eating and drinking restrictions Follow instructions from your health care provider about eating and drinking, which may include:  8 hours before the procedure - stop eating heavy meals or foods such as meat, fried foods, or fatty foods.  6 hours before the procedure - stop eating light meals or foods, such as toast or cereal.  6 hours before the procedure - stop drinking milk or drinks that contain milk.  2 hours before the procedure - stop drinking clear liquids.  Medicines  Ask your health care provider about: ? Changing or stopping your regular medicines. This is especially important if you are taking diabetes medicines or blood thinners. ? Taking medicines such as aspirin and ibuprofen. These medicines can thin your blood. Do not take these medicines before your procedure if your health care provider instructs you not to.  You may be given antibiotic medicine to help prevent infection. General instructions  You may need to have blood tests, a test to check heart rhythm (electrocardiogram, or ECG), or a test to check blood flow (angiogram) before the surgery.  Imaging tests will be done to check the size and location of the aneurysm. These tests could include an ultrasound, a CT scan, or an MRI.  Do not use any products that contain nicotine or tobacco-such as cigarettes and   e-cigarettes-for as long as possible before the surgery. If you need help quitting, ask your health care provider.  Ask your health care provider how your surgical site will be marked or identified.  Plan to have someone take you home from the hospital or clinic. What happens during the procedure?  To reduce your risk of infection: ? Your health care team will wash or  sanitize their hands. ? Your skin will be washed with soap. ? Hair may be removed from the surgical area.  An IV tube will be inserted into one of your veins.  You will be given one or more of the following: ? A medicine to help you relax (sedative). ? A medicine to numb the area (local anesthetic). ? A medicine to make you fall asleep (general anesthetic). ? A medicine that is injected into an area of your body to numb everything below the injection site (regional anesthetic).  During the surgery: ? Small incisions or a puncture will be made on one or both sides of the groin. Long, thin tubes (catheters) will be passed through the opening, put into the artery in your thigh, and moved up into the aneurysm in the aorta. ? The health care provider will use live X-ray pictures to guide the endograft through the catheterto the place where the aneurysm is. ? The endograft will be released to seal off the aneurysm and to line the aorta. It will keep blood from flowing into the aneurysm and will help keep it from rupturing. The endograft will stay in place and will not be taken out. ? X-rays will be used to check where the endograft is placed and to make sure that it is where it should be. ? The catheter will be taken out, and the incision will be closed with stitches (sutures). The procedure may vary among health care providers and hospitals. What happens after the procedure?  Your blood pressure, heart rate, breathing rate, and blood oxygen level will be monitored until the medicines you were given have worn off.  You will need to lie flat for a number of hours. Bending your legs can cause them to bleed and swell.  You will then be urged to get up and move around a number of times each day and to slowly become more active.  You will be given medicines to control pain.  Certain tests may be done after your procedure to check how well the endograft is working and to check its placement.  Do  not drive for 24 hours if you received a sedative. This information is not intended to replace advice given to you by your health care provider. Make sure you discuss any questions you have with your health care provider. Document Released: 03/19/2009 Document Revised: 05/20/2016 Document Reviewed: 01/25/2016 Elsevier Interactive Patient Education  2018 [employer] Inc[employer]  

## 2017-06-13 NOTE — Assessment & Plan Note (Signed)
His duplex today shows continued shrinkage of the aortic sac now measuring only 3.5 cm in maximal diameter without endoleak present.  He is doing well and we will continue to follow this on an annual basis. Contact our office with any problems in the interim.

## 2017-06-13 NOTE — Assessment & Plan Note (Signed)
Symptoms currently reasonably mild and not requiring intervention. If these get worse, return to clinic for further evaluation

## 2017-06-14 ENCOUNTER — Other Ambulatory Visit: Payer: Self-pay

## 2017-06-14 MED ORDER — LISINOPRIL 5 MG PO TABS: 5.0000 mg | ORAL_TABLET | Freq: Every day | ORAL | 0 refills | Status: DC

## 2017-06-14 NOTE — Telephone Encounter (Signed)
Routing to provider. Patient last seen 01/02/17 and has appointment scheduled 07/07/17.

## 2017-07-03 ENCOUNTER — Other Ambulatory Visit: Payer: Self-pay

## 2017-07-03 ENCOUNTER — Ambulatory Visit: Payer: Medicare HMO | Admitting: Unknown Physician Specialty

## 2017-07-03 MED ORDER — LEVOTHYROXINE SODIUM 50 MCG PO TABS: 50.0000 ug | ORAL_TABLET | Freq: Every day | ORAL | 0 refills | Status: DC

## 2017-07-03 NOTE — Telephone Encounter (Signed)
Patient had appointment in 12/2016 and has appointment scheduled for 07/07/17.

## 2017-07-05 ENCOUNTER — Ambulatory Visit (INDEPENDENT_AMBULATORY_CARE_PROVIDER_SITE_OTHER): Payer: Medicare HMO

## 2017-07-05 VITALS — BP 120/81 | HR 61 | Temp 99.5°F | Resp 17 | Ht 72.0 in | Wt 211.3 lb

## 2017-07-05 DIAGNOSIS — Z1159 Encounter for screening for other viral diseases: Secondary | ICD-10-CM | POA: Diagnosis not present

## 2017-07-05 DIAGNOSIS — Z0000 Encounter for general adult medical examination without abnormal findings: Secondary | ICD-10-CM | POA: Diagnosis not present

## 2017-07-05 NOTE — Progress Notes (Signed)
Subjective:   Stuart Harvey is a 66 y.o. male who presents for Medicare Annual/Subsequent preventive examination.  Review of Systems:  Cardiac Risk Factors include: male gender;advanced age (>87men, >76 women);hypertension;dyslipidemia     Objective:    Vitals: BP 120/81 (BP Location: Left Arm, Patient Position: Sitting)   Pulse 61   Temp 99.5 F (37.5 C)   Resp 17   Ht 6' (1.829 m)   Wt 211 lb 4.8 oz (95.8 kg)   BMI 28.66 kg/m   Body mass index is 28.66 kg/m.  Tobacco History  Smoking Status  . Former Smoker  . Packs/day: 1.00  . Years: 35.00  . Types: Cigarettes  . Quit date: 12/16/2005  Smokeless Tobacco  . Current User  . Types: Chew    Comment: quit 2008     Ready to quit: Not Answered Counseling given: Not Answered   Past Medical History:  Diagnosis Date  . AAA (abdominal aortic aneurysm) ([place])    s/p surgical repair in 08/2007 by Dr. Hulda Humphrey  . Arthritis    both knees  . Chronic kidney disease   . Coronary artery disease    CABG at [facility] in 06/2007 for 3 vessel CAD, LIMA to LAD, SVG to RPDA, SVG to OM2 and SVG to D2.  Cath in 2011 showed patent grafts. EF 40%  . Depression   . Hernia of abdominal cavity   . History of heart attack    "silent heart attack"  . Hyperlipidemia   . Hypertension   . Hypothyroidism   . Myocardial infarction [facility])    Past Surgical History:  Procedure Laterality Date  . ABDOMINAL AORTIC ANEURYSM REPAIR  08/2007  . ABDOMINAL AORTIC ANEURYSM REPAIR  11/2012   [facility]  . CARDIAC CATHETERIZATION  2008, 2011   [facility]   . CORONARY ARTERY BYPASS GRAFT  2008  . EXPLORATORY LAPAROTOMY  1997  . HERNIA REPAIR    . JOINT REPLACEMENT     right knee  . KNEE ARTHROPLASTY Left 09/19/2016   Procedure: COMPUTER ASSISTED TOTAL KNEE ARTHROPLASTY;  Surgeon: Dereck Leep, MD;  Location: [facility] ORS;  Service: Orthopedics;  Laterality: Left;  . TOTAL KNEE ARTHROPLASTY Right    Family History  Problem Relation Age of Onset  . Heart disease  Mother   . Heart attack Mother   . Hypertension Mother   . Heart attack Father   . Hypertension Father   . Kidney disease Brother   . Depression Maternal Grandmother   . Depression Maternal Grandfather   . Heart disease Unknown   . Heart attack Unknown   . Prostate cancer Neg Hx   . Kidney cancer Neg Hx    History  Sexual Activity  . Sexual activity: Not on file    Outpatient Encounter Prescriptions as of 07/05/2017  Medication Sig  . aspirin 81 MG tablet Take 81 mg by mouth daily.  Marland Kitchen buPROPion (WELLBUTRIN SR) 200 MG 12 hr tablet Take 1 tablet (200 mg total) by mouth 2 (two) times daily.  . Cholecalciferol (D3 SUPER STRENGTH) 2000 UNITS CAPS Take 1 capsule by mouth daily.   . citalopram (CELEXA) 40 MG tablet Take 1 tablet (40 mg total) by mouth daily.  Marland Kitchen gabapentin (NEURONTIN) 300 MG capsule Take by mouth.  . levothyroxine (SYNTHROID, LEVOTHROID) 50 MCG tablet Take 1 tablet (50 mcg total) by mouth daily.  Marland Kitchen lisinopril (PRINIVIL,ZESTRIL) 5 MG tablet Take 1 tablet (5 mg total) by mouth daily.  . metoprolol tartrate (LOPRESSOR)  25 MG tablet Take 0.5 tablets (12.5 mg total) by mouth 2 (two) times daily.  Marland Kitchen omeprazole (PRILOSEC) 20 MG capsule Take 1 capsule (20 mg total) by mouth daily.  . rosuvastatin (CRESTOR) 40 MG tablet Take 1 tablet (40 mg total) by mouth daily.  . traMADol (ULTRAM) 50 MG tablet Take by mouth.  . [DISCONTINUED] levothyroxine (SYNTHROID, LEVOTHROID) 50 MCG tablet Take 1 tablet (50 mcg total) by mouth daily.   No facility-administered encounter medications on file as of 07/05/2017.     Activities of Daily Living In your present state of health, do you have any difficulty performing the following activities: 07/05/2017 09/19/2016  Hearing? N N  Vision? N N  Difficulty concentrating or making decisions? Y N  Walking or climbing stairs? N N  Dressing or bathing? N N  Doing errands, shopping? N N  Preparing Food and eating ? N -  Using the Toilet? N -  In the  past six months, have you accidently leaked urine? N -  Do you have problems with loss of bowel control? N -  Managing your Medications? N -  Managing your Finances? N -  Housekeeping or managing your Housekeeping? N -  Some recent data might be hidden    Patient Care Team: Kathrine Haddock, NP as PCP - General (Nurse Practitioner) Kathrine Haddock, NP as PCP - Family Medicine (Nurse Practitioner) Marry Guan Laurice Record, MD (Orthopedic Surgery) Algernon Huxley, MD as Referring Physician (Vascular Surgery) Wellington Hampshire, MD as Consulting Physician (Cardiology)   Assessment:     Exercise Activities and Dietary recommendations Current Exercise Habits: The patient has a physically strenous job, but has no regular exercise apart from work., Exercise limited by: None identified  Goals    . Quit smoking / using tobacco          Tobacco cessation discussed      Fall Risk Fall Risk  07/05/2017 07/01/2016  Falls in the past year? No No   Depression Screen PHQ 2/9 Scores 07/05/2017 01/03/2017 07/01/2016 12/18/2015  PHQ - 2 Score 0 0 0 0    Cognitive Function MMSE - Mini Mental State Exam 07/01/2016  Orientation to time 5  Orientation to Place 5  Registration 3  Attention/ Calculation 5  Recall 2  Language- name 2 objects 2  Language- repeat 1  Language- follow 3 step command 3  Language- read & follow direction 1  Write a sentence 1  Copy design 1  Total score 29     6CIT Screen 07/05/2017  What Year? 0 points  What month? 0 points  What time? 0 points  Count back from 20 0 points  Months in reverse 0 points  Repeat phrase 0 points  Total Score 0    Immunization History  Administered Date(s) Administered  . Pneumococcal Conjugate-13 07/01/2016  . Pneumococcal Polysaccharide-23 10/07/2009  . Tdap 06/16/2015  . Zoster 06/16/2015   Screening Tests Health Maintenance  Topic Date Due  . INFLUENZA VACCINE  06/14/2017  . PNA vac Low Risk Adult (2 of 2 - PPSV23) 07/01/2017  .  Hepatitis C Screening  07/07/2017 (Originally [birth year])  . COLONOSCOPY  07/03/2023  . TETANUS/TDAP  06/15/2025      Plan:    I have personally reviewed and addressed the Medicare Annual Wellness questionnaire and have noted the following in the patient's chart:  A. Medical and social history B. Use of alcohol, tobacco or illicit drugs  C. Current medications and supplements D. Functional ability  and status E.  Nutritional status F.  Physical activity G. Advance directives H. List of other physicians I.  Hospitalizations, surgeries, and ER visits in previous 12 months J.  [place] such as hearing and vision if needed, cognitive and depression L. Referrals and appointments   In addition, I have reviewed and discussed with patient certain preventive protocols, quality metrics, and best practice recommendations. A written personalized care plan for preventive services as well as general preventive health recommendations were provided to patient.   Signed,  Tyler Aas, LPN Nurse Health Advisor   MD Recommendations: none

## 2017-07-05 NOTE — Patient Instructions (Addendum)
Stuart Harvey , Thank you for taking time to come for your Medicare Wellness Visit. I appreciate your ongoing commitment to your health goals. Please review the following plan we discussed and let me know if I can assist you in the future.   Screening recommendations/referrals: Colonoscopy: completed 07/09/2013, due 06/2023 Recommended yearly ophthalmology/optometry visit for glaucoma screening and checkup Recommended yearly dental visit for hygiene and checkup  Vaccinations: Influenza vaccine: due 07/2017 Pneumococcal vaccine: up to date Tdap vaccine: up to date Shingles vaccine: up to date  Advanced directives: Please bring a copy of your health care power of attorney and living will to the office at your convenience.  Conditions/risks identified: tobacco cessation discussed  Next appointment: Follow up on 07/07/2017 at 10:30am with Stuart Harvey. Follow up in one year for your annual wellness exam.  Preventive Care 65 Years and Older, Male Preventive care refers to lifestyle choices and visits with your health care provider that can promote health and wellness. What does preventive care include?  A yearly physical exam. This is also called an annual well check.  Dental exams once or twice a year.  Routine eye exams. Ask your health care provider how often you should have your eyes checked.  Personal lifestyle choices, including:  Daily care of your teeth and gums.  Regular physical activity.  Eating a healthy diet.  Avoiding tobacco and drug use.  Limiting alcohol use.  Practicing safe sex.  Taking low doses of aspirin every day.  Taking vitamin and mineral supplements as recommended by your health care provider. What happens during an annual well check? The services and screenings done by your health care provider during your annual well check will depend on your age, overall health, lifestyle risk factors, and family history of disease. Counseling  Your health care  provider may ask you questions about your:  Alcohol use.  Tobacco use.  Drug use.  Emotional well-being.  Home and relationship well-being.  Sexual activity.  Eating habits.  History of falls.  Memory and ability to understand (cognition).  Work and work Statistician. Screening  You may have the following tests or measurements:  Height, weight, and BMI.  Blood pressure.  Lipid and cholesterol levels. These may be checked every 5 years, or more frequently if you are over 37 years old.  Skin check.  Lung cancer screening. You may have this screening every year starting at age 63 if you have a 30-pack-year history of smoking and currently smoke or have quit within the past 15 years.  Fecal occult blood test (FOBT) of the stool. You may have this test every year starting at age 57.  Flexible sigmoidoscopy or colonoscopy. You may have a sigmoidoscopy every 5 years or a colonoscopy every 10 years starting at age 54.  Prostate cancer screening. Recommendations will vary depending on your family history and other risks.  Hepatitis C blood test.  Hepatitis B blood test.  Sexually transmitted disease (STD) testing.  Diabetes screening. This is done by checking your blood sugar (glucose) after you have not eaten for a while (fasting). You may have this done every 1-3 years.  Abdominal aortic aneurysm (AAA) screening. You may need this if you are a current or former smoker.  Osteoporosis. You may be screened starting at age 18 if you are at high risk. Talk with your health care provider about your test results, treatment options, and if necessary, the need for more tests. Vaccines  Your health care provider may recommend certain  vaccines, such as:  Influenza vaccine. This is recommended every year.  Tetanus, diphtheria, and acellular pertussis (Tdap, Td) vaccine. You may need a Td booster every 10 years.  Zoster vaccine. You may need this after age 30.  Pneumococcal  13-valent conjugate (PCV13) vaccine. One dose is recommended after age 101.  Pneumococcal polysaccharide (PPSV23) vaccine. One dose is recommended after age 16. Talk to your health care provider about which screenings and vaccines you need and how often you need them. This information is not intended to replace advice given to you by your health care provider. Make sure you discuss any questions you have with your health care provider. Document Released: 11/27/2015 Document Revised: 07/20/2016 Document Reviewed: 09/01/2015 Elsevier Interactive Patient Education  2017 [place] Prevention in the Home Falls can cause injuries. They can happen to people of all ages. There are many things you can do to make your home safe and to help prevent falls. What can I do on the outside of my home?  Regularly fix the edges of walkways and driveways and fix any cracks.  Remove anything that might make you trip as you walk through a door, such as a raised step or threshold.  Trim any bushes or trees on the path to your home.  Use bright outdoor lighting.  Clear any walking paths of anything that might make someone trip, such as rocks or tools.  Regularly check to see if handrails are loose or broken. Make sure that both sides of any steps have handrails.  Any raised decks and porches should have guardrails on the edges.  Have any leaves, snow, or ice cleared regularly.  Use sand or salt on walking paths during winter.  Clean up any spills in your garage right away. This includes oil or grease spills. What can I do in the bathroom?  Use night lights.  Install grab bars by the toilet and in the tub and shower. Do not use towel bars as grab bars.  Use non-skid mats or decals in the tub or shower.  If you need to sit down in the shower, use a plastic, non-slip stool.  Keep the floor dry. Clean up any water that spills on the floor as soon as it happens.  Remove soap buildup in the  tub or shower regularly.  Attach bath mats securely with double-sided non-slip rug tape.  Do not have throw rugs and other things on the floor that can make you trip. What can I do in the bedroom?  Use night lights.  Make sure that you have a light by your bed that is easy to reach.  Do not use any sheets or blankets that are too big for your bed. They should not hang down onto the floor.  Have a firm chair that has side arms. You can use this for support while you get dressed.  Do not have throw rugs and other things on the floor that can make you trip. What can I do in the kitchen?  Clean up any spills right away.  Avoid walking on wet floors.  Keep items that you use a lot in easy-to-reach places.  If you need to reach something above you, use a strong step stool that has a grab bar.  Keep electrical cords out of the way.  Do not use floor polish or wax that makes floors slippery. If you must use wax, use non-skid floor wax.  Do not have throw rugs and other things  on the floor that can make you trip. What can I do with my stairs?  Do not leave any items on the stairs.  Make sure that there are handrails on both sides of the stairs and use them. Fix handrails that are broken or loose. Make sure that handrails are as long as the stairways.  Check any carpeting to make sure that it is firmly attached to the stairs. Fix any carpet that is loose or worn.  Avoid having throw rugs at the top or bottom of the stairs. If you do have throw rugs, attach them to the floor with carpet tape.  Make sure that you have a light switch at the top of the stairs and the bottom of the stairs. If you do not have them, ask someone to add them for you. What else can I do to help prevent falls?  Wear shoes that:  Do not have high heels.  Have rubber bottoms.  Are comfortable and fit you well.  Are closed at the toe. Do not wear sandals.  If you use a stepladder:  Make sure that it  is fully opened. Do not climb a closed stepladder.  Make sure that both sides of the stepladder are locked into place.  Ask someone to hold it for you, if possible.  Clearly mark and make sure that you can see:  Any grab bars or handrails.  First and last steps.  Where the edge of each step is.  Use tools that help you move around (mobility aids) if they are needed. These include:  Canes.  Walkers.  Scooters.  Crutches.  Turn on the lights when you go into a dark area. Replace any light bulbs as soon as they burn out.  Set up your furniture so you have a clear path. Avoid moving your furniture around.  If any of your floors are uneven, fix them.  If there are any pets around you, be aware of where they are.  Review your medicines with your doctor. Some medicines can make you feel dizzy. This can increase your chance of falling. Ask your doctor what other things that you can do to help prevent falls. This information is not intended to replace advice given to you by your health care provider. Make sure you discuss any questions you have with your health care provider. Document Released: 08/27/2009 Document Revised: 04/07/2016 Document Reviewed: 12/05/2014 Elsevier Interactive Patient Education  2017 [employer].

## 2017-07-07 ENCOUNTER — Ambulatory Visit (INDEPENDENT_AMBULATORY_CARE_PROVIDER_SITE_OTHER): Payer: Medicare HMO | Admitting: Unknown Physician Specialty

## 2017-07-07 ENCOUNTER — Encounter: Payer: Self-pay | Admitting: Unknown Physician Specialty

## 2017-07-07 VITALS — BP 124/77 | HR 75 | Temp 98.5°F | Ht 72.0 in | Wt 209.2 lb

## 2017-07-07 DIAGNOSIS — E785 Hyperlipidemia, unspecified: Secondary | ICD-10-CM

## 2017-07-07 DIAGNOSIS — R972 Elevated prostate specific antigen [PSA]: Secondary | ICD-10-CM | POA: Diagnosis not present

## 2017-07-07 DIAGNOSIS — I10 Essential (primary) hypertension: Secondary | ICD-10-CM

## 2017-07-07 DIAGNOSIS — Z0000 Encounter for general adult medical examination without abnormal findings: Secondary | ICD-10-CM

## 2017-07-07 DIAGNOSIS — N2889 Other specified disorders of kidney and ureter: Secondary | ICD-10-CM | POA: Diagnosis not present

## 2017-07-07 DIAGNOSIS — Z125 Encounter for screening for malignant neoplasm of prostate: Secondary | ICD-10-CM | POA: Diagnosis not present

## 2017-07-07 DIAGNOSIS — I2510 Atherosclerotic heart disease of native coronary artery without angina pectoris: Secondary | ICD-10-CM

## 2017-07-07 DIAGNOSIS — E039 Hypothyroidism, unspecified: Secondary | ICD-10-CM | POA: Diagnosis not present

## 2017-07-07 DIAGNOSIS — M170 Bilateral primary osteoarthritis of knee: Secondary | ICD-10-CM

## 2017-07-07 DIAGNOSIS — Z23 Encounter for immunization: Secondary | ICD-10-CM | POA: Diagnosis not present

## 2017-07-07 NOTE — Assessment & Plan Note (Signed)
Risks of stopping Crestor even temporarily discussed.  Pt would like to stop and recheck in 1 months

## 2017-07-07 NOTE — Assessment & Plan Note (Signed)
Bilateral TKA

## 2017-07-07 NOTE — Assessment & Plan Note (Signed)
Polycystic kidneys through Dr. Abigail Butts

## 2017-07-07 NOTE — Patient Instructions (Addendum)
Pneumococcal Polysaccharide Vaccine: What You Need to Know 1. Why get vaccinated? Vaccination can protect older adults (and some children and younger adults) from pneumococcal disease. Pneumococcal disease is caused by bacteria that can spread from person to person through close contact. It can cause ear infections, and it can also lead to more serious infections of the:  Lungs (pneumonia),  Blood (bacteremia), and  Covering of the brain and spinal cord (meningitis). Meningitis can cause deafness and brain damage, and it can be fatal.  Anyone can get pneumococcal disease, but children under 52 years of age, people with certain medical conditions, adults over 104 years of age, and cigarette smokers are at the highest risk. About 18,000 older adults die each year from pneumococcal disease in the [place]. Treatment of pneumococcal infections with penicillin and other drugs used to be more effective. But some strains of the disease have become resistant to these drugs. This makes prevention of the disease, through vaccination, even more important. 2. Pneumococcal polysaccharide vaccine (PPSV23) Pneumococcal polysaccharide vaccine (PPSV23) protects against 23 types of pneumococcal bacteria. It will not prevent all pneumococcal disease. PPSV23 is recommended for:  All adults 31 years of age and older,  Anyone 2 through 66 years of age with certain long-term health problems,  Anyone 2 through 66 years of age with a weakened immune system,  Adults 35 through 66 years of age who smoke cigarettes or have asthma.  Most people need only one dose of PPSV. A second dose is recommended for certain high-risk groups. People 73 and older should get a dose even if they have gotten one or more doses of the vaccine before they turned 65. Your healthcare provider can give you more information about these recommendations. Most healthy adults develop protection within 2 to 3 weeks of getting the shot. 3.  Some people should not get this vaccine  Anyone who has had a life-threatening allergic reaction to PPSV should not get another dose.  Anyone who has a severe allergy to any component of PPSV should not receive it. Tell your provider if you have any severe allergies.  Anyone who is moderately or severely ill when the shot is scheduled may be asked to wait until they recover before getting the vaccine. Someone with a mild illness can usually be vaccinated.  Children less than 64 years of age should not receive this vaccine.  There is no evidence that PPSV is harmful to either a pregnant woman or to her fetus. However, as a precaution, women who need the vaccine should be vaccinated before becoming pregnant, if possible. 4. Risks of a vaccine reaction With any medicine, including vaccines, there is a chance of side effects. These are usually mild and go away on their own, but serious reactions are also possible. About half of people who get PPSV have mild side effects, such as redness or pain where the shot is given, which go away within about two days. Less than 1 out of 100 people develop a fever, muscle aches, or more severe local reactions. Problems that could happen after any vaccine:  People sometimes faint after a medical procedure, including vaccination. Sitting or lying down for about 15 minutes can help prevent fainting, and injuries caused by a fall. Tell your doctor if you feel dizzy, or have vision changes or ringing in the ears.  Some people get severe pain in the shoulder and have difficulty moving the arm where a shot was given. This happens very rarely.  Any  medication can cause a severe allergic reaction. Such reactions from a vaccine are very rare, estimated at about 1 in a million doses, and would happen within a few minutes to a few hours after the vaccination. As with any medicine, there is a very remote chance of a vaccine causing a serious injury or death. The safety of  vaccines is always being monitored. For more information, visit: http://www.aguilar.org/ 5. What if there is a serious reaction? What should I look for? Look for anything that concerns you, such as signs of a severe allergic reaction, very high fever, or unusual behavior. Signs of a severe allergic reaction can include hives, swelling of the face and throat, difficulty breathing, a fast heartbeat, dizziness, and weakness. These would usually start a few minutes to a few hours after the vaccination. What should I do? If you think it is a severe allergic reaction or other emergency that can't wait, call 9-1-1 or get to the nearest hospital. Otherwise, call your doctor. Afterward, the reaction should be reported to the Vaccine Adverse Event Reporting System (VAERS). Your doctor might file this report, or you can do it yourself through the VAERS web site at www.vaers.SamedayNews.es, or by calling 828-123-9592. VAERS does not give medical advice. 6. How can I learn more?  Ask your doctor. He or she can give you the vaccine package insert or suggest other sources of information.  Call your local or state health department.  Contact the Centers for Disease Control and Prevention (CDC): ? Call 4193166109 (1-800-CDC-INFO) or ? Visit CDC's website at http://hunter.com/ CDC Pneumococcal Polysaccharide Vaccine VIS (03/07/14) This information is not intended to replace advice given to you by your health care provider. Make sure you discuss any questions you have with your health care provider. ---------------------------------------- Co Q 10 100 mg/day

## 2017-07-07 NOTE — Progress Notes (Signed)
BP 124/77   Pulse 75   Temp 98.5 F (36.9 C)   Ht 6' (1.829 m)   Wt 209 lb 3.2 oz (94.9 kg)   SpO2 98%   BMI 28.37 kg/m    Subjective:    Patient ID: Stuart Harvey, male    DOB: 03-20-1951, 66 y.o.   MRN: [id number]  HPI: Stuart Harvey is a 66 y.o. male  Chief Complaint  Patient presents with  . Annual Exam    pt had wellness exam with [facility] on 07/05/17   Annual wellness reviewed with [facility].    Hypertension Using medications without difficulty Average home BPs   No problems or lightheadedness No chest pain with exertion or shortness of breath No Edema   Hyperlipidemia Using medications without problems: Pt is complaining of upper and lower body pain that is similar to when he was taking atorvastatin and he is now on Crestor.  Meds prescribed by Dr. Fletcher Anon. Not due to see him until March.  Diet compliance:Exercise: Working outdoors and walking a lot.    CAD Stable.  Reviewed notes from Dr. Fletcher Anon  Relevant past medical, surgical, family and social history reviewed and updated as indicated. Interim medical history since our last visit reviewed. Allergies and medications reviewed and updated.  Review of Systems  Constitutional: Negative.   HENT: Negative.   Eyes: Negative.   Respiratory: Negative.   Cardiovascular: Negative.   Gastrointestinal: Negative.   Endocrine: Negative.   Genitourinary: Negative.   Skin: Negative.   Allergic/Immunologic: Negative.   Neurological: Negative.   Hematological: Negative.   Psychiatric/Behavioral: Negative.     Per HPI unless specifically indicated above     Objective:    BP 124/77   Pulse 75   Temp 98.5 F (36.9 C)   Ht 6' (1.829 m)   Wt 209 lb 3.2 oz (94.9 kg)   SpO2 98%   BMI 28.37 kg/m   Wt Readings from Last 3 Encounters:  07/07/17 209 lb 3.2 oz (94.9 kg)  07/05/17 211 lb 4.8 oz (95.8 kg)  06/13/17 208 lb (94.3 kg)    Physical Exam  Constitutional: He is oriented to person, place, and time. He appears  well-developed and well-nourished.  HENT:  Head: Normocephalic.  Right Ear: Tympanic membrane, external ear and ear canal normal.  Left Ear: Tympanic membrane, external ear and ear canal normal.  Mouth/Throat: Uvula is midline, oropharynx is clear and moist and mucous membranes are normal.  Eyes: Pupils are equal, round, and reactive to light.  Cardiovascular: Normal rate, regular rhythm and normal heart sounds.  Exam reveals no gallop and no friction rub.   No murmur heard. Pulmonary/Chest: Effort normal and breath sounds normal. No respiratory distress.  Abdominal: Soft. Bowel sounds are normal. He exhibits no distension. There is no tenderness.  Genitourinary: Rectum normal and prostate normal.  Musculoskeletal: Normal range of motion.  Neurological: He is alert and oriented to person, place, and time. He has normal reflexes.  Skin: Skin is warm and dry.  Psychiatric: He has a normal mood and affect. His behavior is normal. Judgment and thought content normal.    Results for orders placed or performed during the hospital encounter of 09/19/16  CBC  Result Value Ref Range   WBC 11.2 (H) 3.8 - 10.6 K/uL   RBC 3.24 (L) 4.40 - 5.90 MIL/uL   Hemoglobin 9.0 (L) 13.0 - 18.0 g/dL   HCT 27.4 (L) 40.0 - 52.0 %   MCV 84.4 80.0 -  100.0 fL   MCH 27.7 26.0 - 34.0 pg   MCHC 32.8 32.0 - 36.0 g/dL   RDW 15.5 (H) 11.5 - 14.5 %   Platelets 141 (L) 150 - 440 K/uL  Basic metabolic panel  Result Value Ref Range   Sodium 138 135 - 145 mmol/L   Potassium 4.4 3.5 - 5.1 mmol/L   Chloride 110 101 - 111 mmol/L   CO2 25 22 - 32 mmol/L   Glucose, Bld 141 (H) 65 - 99 mg/dL   BUN 27 (H) 6 - 20 mg/dL   Creatinine, Ser 1.07 0.61 - 1.24 mg/dL   Calcium 8.4 (L) 8.9 - 10.3 mg/dL   GFR calc non Af Amer >60 >60 mL/min   GFR calc Af Amer >60 >60 mL/min   Anion gap 3 (L) 5 - 15  CBC  Result Value Ref Range   WBC 8.9 3.8 - 10.6 K/uL   RBC 3.04 (L) 4.40 - 5.90 MIL/uL   Hemoglobin 8.5 (L) 13.0 - 18.0 g/dL    HCT 25.9 (L) 40.0 - 52.0 %   MCV 85.1 80.0 - 100.0 fL   MCH 27.8 26.0 - 34.0 pg   MCHC 32.6 32.0 - 36.0 g/dL   RDW 16.1 (H) 11.5 - 14.5 %   Platelets 133 (L) 150 - 440 K/uL  Basic metabolic panel  Result Value Ref Range   Sodium 140 135 - 145 mmol/L   Potassium 4.1 3.5 - 5.1 mmol/L   Chloride 109 101 - 111 mmol/L   CO2 25 22 - 32 mmol/L   Glucose, Bld 94 65 - 99 mg/dL   BUN 26 (H) 6 - 20 mg/dL   Creatinine, Ser 1.21 0.61 - 1.24 mg/dL   Calcium 8.3 (L) 8.9 - 10.3 mg/dL   GFR calc non Af Amer >60 >60 mL/min   GFR calc Af Amer >60 >60 mL/min   Anion gap 6 5 - 15      Assessment & Plan:   Problem List Items Addressed This Visit      Unprioritized   Arthritis of knee, degenerative    Bilateral TKA      Coronary artery disease    Risks of stopping Crestor even temporarily discussed.  Pt would like to stop and recheck in 1 months      Hyperlipidemia    Wants to have a trial of stopping Crestor due to muscle pain.  Will stop and recheck in 1 months      Relevant Orders   Lipid Panel w/o Chol/HDL Ratio   Hypertension    Stable, continue present medications.        Relevant Orders   Comprehensive metabolic panel   Prostate cancer screening   Relevant Orders   PSA   Renal mass    Polycystic kidneys through Dr. Abigail Butts      Thyroid activity decreased   Relevant Orders   TSH    Other Visit Diagnoses    Need for pneumococcal vaccination    -  Primary   Relevant Orders   Pneumococcal polysaccharide vaccine 23-valent greater than or equal to 2yo subcutaneous/IM (Completed)   Annual physical exam           Follow up plan: Return in about 4 weeks (around 08/04/2017).

## 2017-07-07 NOTE — Assessment & Plan Note (Signed)
Wants to have a trial of stopping Crestor due to muscle pain.  Will stop and recheck in 1 months

## 2017-07-07 NOTE — Assessment & Plan Note (Signed)
Stable, continue present medications.   

## 2017-07-08 LAB — COMPREHENSIVE METABOLIC PANEL
A/G RATIO: 1.7 (ref 1.2–2.2)
ALBUMIN: 4.3 g/dL (ref 3.6–4.8)
ALT: 12 IU/L (ref 0–44)
AST: 27 IU/L (ref 0–40)
Alkaline Phosphatase: 99 IU/L (ref 39–117)
BUN/Creatinine Ratio: 23 (ref 10–24)
BUN: 31 mg/dL — ABNORMAL HIGH (ref 8–27)
Bilirubin Total: 0.4 mg/dL (ref 0.0–1.2)
CALCIUM: 9.7 mg/dL (ref 8.6–10.2)
CO2: 21 mmol/L (ref 20–29)
CREATININE: 1.34 mg/dL — AB (ref 0.76–1.27)
Chloride: 105 mmol/L (ref 96–106)
GFR, EST AFRICAN AMERICAN: 63 mL/min/{1.73_m2} (ref >59)
GFR, EST NON AFRICAN AMERICAN: 55 mL/min/{1.73_m2} — AB (ref >59)
GLOBULIN, TOTAL: 2.5 g/dL (ref 1.5–4.5)
Glucose: 98 mg/dL (ref 65–99)
Potassium: 5.1 mmol/L (ref 3.5–5.2)
SODIUM: 139 mmol/L (ref 134–144)
TOTAL PROTEIN: 6.8 g/dL (ref 6.0–8.5)

## 2017-07-08 LAB — LIPID PANEL W/O CHOL/HDL RATIO
Cholesterol, Total: 133 mg/dL (ref 100–199)
HDL: 47 mg/dL (ref >39)
LDL CALC: 71 mg/dL (ref 0–99)
TRIGLYCERIDES: 75 mg/dL (ref 0–149)
VLDL Cholesterol Cal: 15 mg/dL (ref 5–40)

## 2017-07-08 LAB — PSA: PROSTATE SPECIFIC AG, SERUM: 5.1 ng/mL — AB (ref 0.0–4.0)

## 2017-07-08 LAB — TSH: TSH: 4.28 u[IU]/mL (ref 0.450–4.500)

## 2017-07-10 ENCOUNTER — Telehealth: Payer: Self-pay | Admitting: Unknown Physician Specialty

## 2017-07-10 DIAGNOSIS — R972 Elevated prostate specific antigen [PSA]: Secondary | ICD-10-CM | POA: Insufficient documentation

## 2017-07-10 NOTE — Telephone Encounter (Signed)
Discussed cholesterol and PSA.   Will refer to Urology

## 2017-07-31 ENCOUNTER — Ambulatory Visit: Payer: Medicare HMO | Admitting: Urology

## 2017-07-31 ENCOUNTER — Encounter: Payer: Self-pay | Admitting: Urology

## 2017-07-31 VITALS — BP 109/75 | HR 65 | Ht 72.0 in | Wt 209.7 lb

## 2017-07-31 DIAGNOSIS — N2889 Other specified disorders of kidney and ureter: Secondary | ICD-10-CM | POA: Diagnosis not present

## 2017-07-31 DIAGNOSIS — Z125 Encounter for screening for malignant neoplasm of prostate: Secondary | ICD-10-CM

## 2017-07-31 DIAGNOSIS — R972 Elevated prostate specific antigen [PSA]: Secondary | ICD-10-CM | POA: Diagnosis not present

## 2017-07-31 DIAGNOSIS — N183 Chronic kidney disease, stage 3 unspecified: Secondary | ICD-10-CM

## 2017-07-31 MED ORDER — TAMSULOSIN HCL 0.4 MG PO CAPS: 0.4000 mg | ORAL_CAPSULE | Freq: Every day | ORAL | 3 refills | Status: DC

## 2017-07-31 NOTE — Progress Notes (Signed)
07/31/2017 10:45 AM   Stuart Harvey [birth year] [id number]  Referring provider: Kathrine Haddock, NP [address], [place] [address]  CC: Follow up Left Cystic Renal Mass, Elevated PSA, Stage 3 Renal Insufficiency  HPI:    1 - Left Cystic Renal Mass - 9.5cm conglomerate of lower pole cysts w/o complex features or mass effect stable on imaging since 2008 including contrast MRI 2016. Recent Surveillance: 2018 Renal US - stable LLP cysts 9.5cm  2 -  Elevated PSA - No FHX prostate cancer. He has substantial CV comorbidity.  2017 PSA 1.9 06/2017 PSA 5.1 / DRE 50gm smooth  3 - Stage 3 Renal Insufficiency - Cr 1.3's at baseline. Imaging x many w/o hydro.  4 - Lower Urinary Tract Symptoms / Urinary Urgency - slowly progressive bother from mix of irritative and obstructive sympotms. DRE 2018 50gm. Starting tamsulosin trial.   PMH sig for CAD/CABG (now now limiting whatsoever), AAA / Aortobifem, HLD. No blood thinners.  Today "Stuart Harvey" is seen in f/u above, specifically for opinion on elevated PSA.    PMH: Past Medical History:  Diagnosis Date  . AAA (abdominal aortic aneurysm) ([place])    s/p surgical repair in 08/2007 by Dr. Hulda Humphrey  . Arthritis    both knees  . Chronic kidney disease   . Coronary artery disease    CABG at [facility] in 06/2007 for 3 vessel CAD, LIMA to LAD, SVG to RPDA, SVG to OM2 and SVG to D2.  Cath in 2011 showed patent grafts. EF 40%  . Depression   . Hernia of abdominal cavity   . History of heart attack    "silent heart attack"  . Hyperlipidemia   . Hypertension   . Hypothyroidism   . Myocardial infarction [facility])     Surgical History: Past Surgical History:  Procedure Laterality Date  . ABDOMINAL AORTIC ANEURYSM REPAIR  08/2007  . ABDOMINAL AORTIC ANEURYSM REPAIR  11/2012   [facility]  . CARDIAC CATHETERIZATION  2008, 2011   [facility]   . CORONARY ARTERY BYPASS GRAFT  2008  . EXPLORATORY LAPAROTOMY  1997  . HERNIA REPAIR    . JOINT REPLACEMENT     right knee  . KNEE  ARTHROPLASTY Left 09/19/2016   Procedure: COMPUTER ASSISTED TOTAL KNEE ARTHROPLASTY;  Surgeon: Dereck Leep, MD;  Location: [facility] ORS;  Service: Orthopedics;  Laterality: Left;  . TOTAL KNEE ARTHROPLASTY Right     Home Medications:  Allergies as of 07/31/2017      Reactions   Codeine Nausea Only   Tetracycline Rash      Medication List       Accurate as of 07/31/17 10:45 AM. Always use your most recent med list.          aspirin 81 MG tablet Take 81 mg by mouth daily.   buPROPion 200 MG 12 hr tablet Commonly known as:  WELLBUTRIN SR Take 1 tablet (200 mg total) by mouth 2 (two) times daily.   citalopram 40 MG tablet Commonly known as:  CELEXA Take 1 tablet (40 mg total) by mouth daily.   D3 SUPER STRENGTH 2000 units Caps Generic drug:  Cholecalciferol Take 1 capsule by mouth daily.   gabapentin 300 MG capsule Commonly known as:  NEURONTIN Take by mouth.   levothyroxine 50 MCG tablet Commonly known as:  SYNTHROID, LEVOTHROID Take 1 tablet (50 mcg total) by mouth daily.   lisinopril 5 MG tablet Commonly known as:  PRINIVIL,ZESTRIL Take 1 tablet (5 mg total) by  mouth daily.   metoprolol tartrate 25 MG tablet Commonly known as:  LOPRESSOR Take 0.5 tablets (12.5 mg total) by mouth 2 (two) times daily.   omeprazole 20 MG capsule Commonly known as:  PRILOSEC Take 1 capsule (20 mg total) by mouth daily.   traMADol 50 MG tablet Commonly known as:  ULTRAM Take by mouth.       Allergies:  Allergies  Allergen Reactions  . Codeine Nausea Only  . Tetracycline Rash    Family History: Family History  Problem Relation Age of Onset  . Heart disease Mother   . Heart attack Mother   . Hypertension Mother   . Heart attack Father   . Hypertension Father   . Kidney disease Brother   . Depression Maternal Grandmother   . Depression Maternal Grandfather   . Heart disease Unknown   . Heart attack Unknown   . Prostate cancer Neg Hx   . Kidney cancer Neg Hx      Social History:  reports that he quit smoking about 11 years ago. His smoking use included Cigarettes. He has a 35.00 pack-year smoking history. His smokeless tobacco use includes Chew. He reports that he does not drink alcohol or use drugs.    Review of Systems  Gastrointestinal (upper)  : Negative for upper GI symptoms  Gastrointestinal (lower) : Negative for lower GI symptoms  Constitutional : Negative for symptoms  Skin: Negative for skin symptoms  Eyes: Negative for eye symptoms  Ear/Nose/Throat : Negative for Ear/Nose/Throat symptoms  Hematologic/Lymphatic: Negative for Hematologic/Lymphatic symptoms  Cardiovascular : Negative for cardiovascular symptoms  Respiratory : Negative for respiratory symptoms  Endocrine: Negative for endocrine symptoms  Musculoskeletal: Negative for musculoskeletal symptoms  Neurological: Negative for neurological symptoms  Psychologic: Negative for psychiatric symptoms   Physical Exam: There were no vitals taken for this visit.  Constitutional:  Alert and oriented, No acute distress. HEENT: [place] AT, moist mucus membranes.  Trachea midline, no masses. Cardiovascular: No clubbing, cyanosis, or edema. Respiratory: Normal respiratory effort, no increased work of breathing. GI: Abdomen is soft, nontender, nondistended, no abdominal masses GU: No CVA tenderness. DRE 50gm smooth.  Skin: No rashes, bruises or suspicious lesions. Lymph: No cervical or inguinal adenopathy. Neurologic: Grossly intact, no focal deficits, moving all 4 extremities. Psychiatric: Normal mood and affect.  Laboratory Data: Lab Results  Component Value Date   WBC 8.9 09/21/2016   HGB 8.5 (L) 09/21/2016   HCT 25.9 (L) 09/21/2016   MCV 85.1 09/21/2016   PLT 133 (L) 09/21/2016    Lab Results  Component Value Date   CREATININE 1.34 (H) 07/07/2017    No results found for: PSA  No results found for: TESTOSTERONE  Lab Results  Component Value  Date   HGBA1C 5.7 (H) 07/01/2016    Urinalysis    Component Value Date/Time   COLORURINE YELLOW (A) 09/06/2016 0852   APPEARANCEUR CLEAR (A) 09/06/2016 0852   APPEARANCEUR Clear 07/01/2016 1348   LABSPEC 1.016 09/06/2016 0852   LABSPEC 1.014 02/20/2015 1933   PHURINE 5.0 09/06/2016 0852   GLUCOSEU NEGATIVE 09/06/2016 0852   GLUCOSEU Negative 02/20/2015 1933   HGBUR NEGATIVE 09/06/2016 0852   BILIRUBINUR NEGATIVE 09/06/2016 0852   BILIRUBINUR Negative 07/01/2016 1348   BILIRUBINUR Negative 02/20/2015 1933   KETONESUR NEGATIVE 09/06/2016 0852   PROTEINUR NEGATIVE 09/06/2016 0852   NITRITE NEGATIVE 09/06/2016 0852   LEUKOCYTESUR NEGATIVE 09/06/2016 0852   LEUKOCYTESUR Negative 07/01/2016 1348   LEUKOCYTESUR Negative 02/20/2015 1933  Pertinent Imaging: as per HPI  Assessment & Plan:    1 - Left Cystic Renal Mass - stable for over 10 years. Very low chance of clinically significant neoplasm. Do favor continued yearly [place] surveillance given size of lesion to r/o progresison to mass effect.   2 -  Elevated PSA -  DDX benign and malignant etiologeis discussed as well as natural history of prostate cancer in contest of his substantial comorbidity. Further management options of no further screening, lab recheck with abstinance prior, imaging studies, genetic tests, biopsy discussed. Frankly rec recheck in 6 weeks with 3 days abstinance prior, consider BX only if persistantly above his baseline.   He opts for recheck and I agree.   3 - Stage 3 Renal Insufficiency - stable medical renal disease. Reinforced importanct of BP and glycemic control.  4 - Lower Urinary Tract Symptoms - likely due to enlarged prostate by exam and history. Discussed med trial with alpha blocker and he would like to try tamsulsin. Warned about orthostasis and retrograde ejaculation  RTC 6 weeks with PSA and see if alpha blocker meeting his goals or LUTS    [name] Frock, MD  [facility] [address] [place], [place] [address] [phone]

## 2017-08-07 ENCOUNTER — Ambulatory Visit (INDEPENDENT_AMBULATORY_CARE_PROVIDER_SITE_OTHER): Payer: Medicare HMO | Admitting: Unknown Physician Specialty

## 2017-08-07 ENCOUNTER — Encounter: Payer: Self-pay | Admitting: Unknown Physician Specialty

## 2017-08-07 DIAGNOSIS — D649 Anemia, unspecified: Secondary | ICD-10-CM | POA: Diagnosis not present

## 2017-08-07 DIAGNOSIS — E785 Hyperlipidemia, unspecified: Secondary | ICD-10-CM

## 2017-08-07 DIAGNOSIS — M791 Myalgia, unspecified site: Secondary | ICD-10-CM | POA: Insufficient documentation

## 2017-08-07 NOTE — Progress Notes (Signed)
BP 112/71   Pulse 61   Temp 98.6 F (37 C)   Wt 211 lb 3.2 oz (95.8 kg)   SpO2 97%   BMI 28.64 kg/m    Subjective:    Patient ID: Stuart Harvey, male    DOB: 11-04-1951, 66 y.o.   MRN: [id number]  HPI: Stuart Harvey is a 66 y.o. male  Chief Complaint  Patient presents with  . Hyperlipidemia    4 week f/up   Myalgias Trial of stopping Crestor due to joint and muscle pain.  States he has upper arm pain with just lifting them.  Sometimes neck and quadraceps hurts.    Relevant past medical, surgical, family and social history reviewed and updated as indicated. Interim medical history since our last visit reviewed. Allergies and medications reviewed and updated.  Review of Systems  Per HPI unless specifically indicated above     Objective:    BP 112/71   Pulse 61   Temp 98.6 F (37 C)   Wt 211 lb 3.2 oz (95.8 kg)   SpO2 97%   BMI 28.64 kg/m   Wt Readings from Last 3 Encounters:  08/07/17 211 lb 3.2 oz (95.8 kg)  07/31/17 209 lb 11.2 oz (95.1 kg)  07/07/17 209 lb 3.2 oz (94.9 kg)    Physical Exam  Constitutional: He is oriented to person, place, and time. He appears well-developed and well-nourished. No distress.  HENT:  Head: Normocephalic and atraumatic.  Eyes: Conjunctivae and lids are normal. Right eye exhibits no discharge. Left eye exhibits no discharge. No scleral icterus.  Neck: Normal range of motion. Neck supple. No JVD present. Carotid bruit is not present.  Cardiovascular: Normal rate, regular rhythm and normal heart sounds.   Pulmonary/Chest: Effort normal and breath sounds normal. No respiratory distress.  Abdominal: Normal appearance. There is no splenomegaly or hepatomegaly.  Musculoskeletal: Normal range of motion.  Neurological: He is alert and oriented to person, place, and time.  Skin: Skin is warm, dry and intact. No rash noted. No pallor.  Psychiatric: He has a normal mood and affect. His behavior is normal. Judgment and thought content  normal.    Results for orders placed or performed in visit on 07/07/17  PSA  Result Value Ref Range   Prostate Specific Ag, Serum 5.1 (H) 0.0 - 4.0 ng/mL  TSH  Result Value Ref Range   TSH 4.280 0.450 - 4.500 uIU/mL  Comprehensive metabolic panel  Result Value Ref Range   Glucose 98 65 - 99 mg/dL   BUN 31 (H) 8 - 27 mg/dL   Creatinine, Ser 1.34 (H) 0.76 - 1.27 mg/dL   GFR calc non Af Amer 55 (L) >59 mL/min/1.73   GFR calc Af Amer 63 >59 mL/min/1.73   BUN/Creatinine Ratio 23 10 - 24   Sodium 139 134 - 144 mmol/L   Potassium 5.1 3.5 - 5.2 mmol/L   Chloride 105 96 - 106 mmol/L   CO2 21 20 - 29 mmol/L   Calcium 9.7 8.6 - 10.2 mg/dL   Total Protein 6.8 6.0 - 8.5 g/dL   Albumin 4.3 3.6 - 4.8 g/dL   Globulin, Total 2.5 1.5 - 4.5 g/dL   Albumin/Globulin Ratio 1.7 1.2 - 2.2   Bilirubin Total 0.4 0.0 - 1.2 mg/dL   Alkaline Phosphatase 99 39 - 117 IU/L   AST 27 0 - 40 IU/L   ALT 12 0 - 44 IU/L  Lipid Panel w/o Chol/HDL Ratio  Result Value Ref  Range   Cholesterol, Total 133 100 - 199 mg/dL   Triglycerides 75 0 - 149 mg/dL   HDL 47 >39 mg/dL   VLDL Cholesterol Cal 15 5 - 40 mg/dL   LDL Calculated 71 0 - 99 mg/dL      Assessment & Plan:   Problem List Items Addressed This Visit      Unprioritized   Hyperlipidemia    No change in myalgias off of Crestor.  Restart Crestor.        Myalgia    Check rheumatoid labs.  Pt ed on NSAIDS and polycystic kidneys      Relevant Orders   CBC with Differential/Platelet   Vitamin B12   VITAMIN D 25 Hydroxy (Vit-D Deficiency, Fractures)   Sed Rate (ESR)   C-reactive protein       Follow up plan: Return in about 3 months (around 11/06/2017).

## 2017-08-07 NOTE — Assessment & Plan Note (Signed)
Check rheumatoid labs.  Pt ed on NSAIDS and polycystic kidneys

## 2017-08-07 NOTE — Assessment & Plan Note (Addendum)
No change in myalgias off of Crestor.  Restart Crestor.

## 2017-08-08 ENCOUNTER — Other Ambulatory Visit: Payer: Self-pay | Admitting: Unknown Physician Specialty

## 2017-08-08 DIAGNOSIS — D649 Anemia, unspecified: Secondary | ICD-10-CM

## 2017-08-08 LAB — CBC WITH DIFFERENTIAL/PLATELET
Basophils Absolute: 0 10*3/uL (ref 0.0–0.2)
Basos: 0 %
EOS (ABSOLUTE): 0.3 10*3/uL (ref 0.0–0.4)
EOS: 5 %
HEMATOCRIT: 31.3 % — AB (ref 37.5–51.0)
Hemoglobin: 9.3 g/dL — ABNORMAL LOW (ref 13.0–17.7)
Immature Grans (Abs): 0 10*3/uL (ref 0.0–0.1)
Immature Granulocytes: 0 %
LYMPHS ABS: 3.1 10*3/uL (ref 0.7–3.1)
Lymphs: 43 %
MCH: 22.2 pg — AB (ref 26.6–33.0)
MCHC: 29.7 g/dL — AB (ref 31.5–35.7)
MCV: 75 fL — AB (ref 79–97)
MONOCYTES: 6 %
MONOS ABS: 0.4 10*3/uL (ref 0.1–0.9)
NEUTROS ABS: 3.3 10*3/uL (ref 1.4–7.0)
Neutrophils: 46 %
Platelets: 241 10*3/uL (ref 150–379)
RBC: 4.18 x10E6/uL (ref 4.14–5.80)
RDW: 18.8 % — AB (ref 12.3–15.4)
WBC: 7.1 10*3/uL (ref 3.4–10.8)

## 2017-08-08 LAB — C-REACTIVE PROTEIN: CRP: 1.4 mg/L (ref 0.0–4.9)

## 2017-08-08 LAB — VITAMIN B12: Vitamin B-12: 424 pg/mL (ref 232–1245)

## 2017-08-08 LAB — VITAMIN D 25 HYDROXY (VIT D DEFICIENCY, FRACTURES): Vit D, 25-Hydroxy: 73.8 ng/mL (ref 30.0–100.0)

## 2017-08-08 LAB — SEDIMENTATION RATE: Sed Rate: 8 mm/hr (ref 0–30)

## 2017-08-08 NOTE — Progress Notes (Signed)
Order anemia panel

## 2017-08-10 ENCOUNTER — Telehealth: Payer: Self-pay | Admitting: Unknown Physician Specialty

## 2017-08-10 DIAGNOSIS — D509 Iron deficiency anemia, unspecified: Secondary | ICD-10-CM

## 2017-08-10 NOTE — Telephone Encounter (Signed)
I've routed it [facility], tried calling a few times, went to voice message. Left message for [facility] to return my call due to urgent referral.

## 2017-08-10 NOTE — Telephone Encounter (Signed)
Patient's wife called to speak with provider. Husband wanted wife to speak with provider as well.   Please Advise.

## 2017-08-10 NOTE — Telephone Encounter (Signed)
Called and let patient's wife know what was going on per [place]. Patient's wife states that she actually spoke with Malachy Mood about 20 minutes ago.

## 2017-08-10 NOTE — Telephone Encounter (Signed)
Calling about anemia.  Not able to leave message as mailbox full.

## 2017-08-10 NOTE — Telephone Encounter (Signed)
Called patient notified him that referral has been marked urgent and sent to [facility].  Explained I have called and left message for office to return my phone call.  Patient understood, I'd told him I'd call him as soon as I heard something or of his appointment.

## 2017-08-10 NOTE — Telephone Encounter (Signed)
Discussed with pt.  Iron is low and is anemic.  Take OTC iron (I like the slo-FE).  Try to work up to twice a day.  Will also need referral to GI for further evaluation.  Referral placed.

## 2017-08-10 NOTE — Telephone Encounter (Signed)
Patient calling to return call to provider. Patient aware the call may be about lab results would like to speak with CMA or provider about results.   Please Advise.  Thank you

## 2017-08-10 NOTE — Telephone Encounter (Signed)
Routing to provider  

## 2017-08-11 NOTE — Telephone Encounter (Signed)
He has an appointment scheduled 09/08/2017 at [facility].

## 2017-08-11 NOTE — Telephone Encounter (Signed)
Any word on this referral?

## 2017-08-11 NOTE — Telephone Encounter (Signed)
Appointment scheduled for Wednesday 08/16/17 at 10:00 at [place]. Patient notified.

## 2017-08-16 ENCOUNTER — Other Ambulatory Visit (INDEPENDENT_AMBULATORY_CARE_PROVIDER_SITE_OTHER): Payer: Medicare HMO

## 2017-08-16 ENCOUNTER — Encounter (INDEPENDENT_AMBULATORY_CARE_PROVIDER_SITE_OTHER): Payer: Self-pay

## 2017-08-16 ENCOUNTER — Other Ambulatory Visit: Payer: Self-pay

## 2017-08-16 ENCOUNTER — Ambulatory Visit (INDEPENDENT_AMBULATORY_CARE_PROVIDER_SITE_OTHER): Payer: Medicare HMO | Admitting: Physician Assistant

## 2017-08-16 ENCOUNTER — Encounter: Payer: Self-pay | Admitting: Physician Assistant

## 2017-08-16 VITALS — BP 96/60 | HR 85 | Ht 71.0 in | Wt 212.0 lb

## 2017-08-16 DIAGNOSIS — K921 Melena: Secondary | ICD-10-CM

## 2017-08-16 DIAGNOSIS — M791 Myalgia, unspecified site: Secondary | ICD-10-CM | POA: Diagnosis not present

## 2017-08-16 DIAGNOSIS — D508 Other iron deficiency anemias: Secondary | ICD-10-CM | POA: Diagnosis not present

## 2017-08-16 LAB — CBC WITH DIFFERENTIAL/PLATELET
Basophils Absolute: 0.1 10*3/uL (ref 0.0–0.1)
Basophils Relative: 0.8 % (ref 0.0–3.0)
EOS PCT: 4 % (ref 0.0–5.0)
Eosinophils Absolute: 0.4 10*3/uL (ref 0.0–0.7)
HEMATOCRIT: 32 % — AB (ref 39.0–52.0)
HEMOGLOBIN: 9.9 g/dL — AB (ref 13.0–17.0)
LYMPHS PCT: 36.4 % (ref 12.0–46.0)
Lymphs Abs: 3.3 10*3/uL (ref 0.7–4.0)
MCHC: 31 g/dL (ref 30.0–36.0)
MCV: 74.8 fl — ABNORMAL LOW (ref 78.0–100.0)
MONO ABS: 0.5 10*3/uL (ref 0.1–1.0)
MONOS PCT: 6 % (ref 3.0–12.0)
Neutro Abs: 4.8 10*3/uL (ref 1.4–7.7)
Neutrophils Relative %: 52.8 % (ref 43.0–77.0)
Platelets: 237 10*3/uL (ref 150.0–400.0)
RBC: 4.28 Mil/uL (ref 4.22–5.81)
RDW: 19.5 % — ABNORMAL HIGH (ref 11.5–15.5)
WBC: 9 10*3/uL (ref 4.0–10.5)

## 2017-08-16 LAB — SEDIMENTATION RATE: Sed Rate: 15 mm/hr (ref 0–20)

## 2017-08-16 MED ORDER — NA SULFATE-K SULFATE-MG SULF 17.5-3.13-1.6 GM/177ML PO SOLN: ORAL | 0 refills | Status: DC

## 2017-08-16 NOTE — Progress Notes (Signed)
Agree with assessment and plan as outlined.  

## 2017-08-16 NOTE — Patient Instructions (Addendum)
Please go to the basement level to have your labs drawn.   You have been scheduled for a Colonoscopy and Endoscopy. Please follow written instructions given to you at your visit today.  Please pick up your prep supplies at the pharmacy within the next 1-3 days. [place], [place], [place].  If you use inhalers (even only as needed), please bring them with you on the day of your procedure. Your physician has requested that you go to www.startemmi.com and enter the access code given to you at your visit today. This web site gives a general overview about your procedure. However, you should still follow specific instructions given to you by our office regarding your preparation for the procedure.   If you are age 64 or older, your body mass index should be between 23-30. Your Body mass index is 29.57 kg/m. If this is out of the aforementioned range listed, please consider follow up with your Primary Care Provider.  Thank you for choosing [facility]

## 2017-08-16 NOTE — Progress Notes (Signed)
Subjective:    Patient ID: Stuart Harvey, male    DOB: [birth year], 66 y.o.   MRN: [id number]  HPI Stuart "Harvey Resides " a pleasant 66 year old White male, new to GI today referred by Kathrine Haddock NP/ [facility] for evaluation of iron deficiency anemia. Patient had labs done on 08/07/2017 showing hemoglobin of 9.3, hematocrit of 31.3, MCV of 75, serum iron 17, TIBC 420 and iron saturation of 4. Reviewing his previous labs hemoglobin was 9 of 20 7. for about 11 months ago and 2 years ago hemoglobin 12.2. Patient has history of coronary artery disease, status post CABG, hypertension, status post abdominal aortic aneurysm repair 2008, polycystic kidney disease, hyperlipidemia, depression and bilateral knee replacements. Patient and wife do not recall him having issues with iron deficiency in the past. He has just started oral iron. Review of care everywhere shows that he did have GI workup in 2016 by Dr. Verdie Shire ,in [facility]. It appears that he had and upper endoscopy and capsule endoscopy and perhaps a colonoscopy. I cannot pull up the actual reports and these have been requested. Patient says he had had previous colonoscopies done at the current [facility] clinic as well. Neither he nor his wife can tell me for sure why evaluation was being done a remember something about a possible bowel obstruction. He currently feels okay ,he has been having a lot of difficulty with diffuse myalgias. His lipid meds had been switched without any change in symptoms. He says he has felt sore and uncomfortable all over for couple of months. His weight has been stable, appetite fine. He denies any abdominal pain or change in bowel habits. He has no complaints of heartburn on daily omeprazole, no dysphagia or odynophagia. He recalls seeing some blood in the commode and in his stool perhaps 3 times over the past several months, no melena.   He has  also recently learned of an elevated PSA level and  has an enlarged prostate. He is undergoing evaluation with Dr. Tammi Klippel.  Review of Systems Pertinent positive and negative review of systems were noted in the above HPI section.  All other review of systems was otherwise negative.  Outpatient Encounter Prescriptions as of 08/16/2017  Medication Sig  . aspirin 81 MG tablet Take 81 mg by mouth daily.  Marland Kitchen buPROPion (WELLBUTRIN SR) 200 MG 12 hr tablet Take 1 tablet (200 mg total) by mouth 2 (two) times daily.  . Cholecalciferol (D3 SUPER STRENGTH) 2000 UNITS CAPS Take 1 capsule by mouth daily.   . citalopram (CELEXA) 40 MG tablet Take 1 tablet (40 mg total) by mouth daily.  . Ferrous Sulfate (SLOW FE PO) Take by mouth.  . levothyroxine (SYNTHROID, LEVOTHROID) 50 MCG tablet Take 1 tablet (50 mcg total) by mouth daily.  Marland Kitchen lisinopril (PRINIVIL,ZESTRIL) 5 MG tablet Take 1 tablet (5 mg total) by mouth daily.  . metoprolol tartrate (LOPRESSOR) 25 MG tablet Take 0.5 tablets (12.5 mg total) by mouth 2 (two) times daily.  Marland Kitchen omeprazole (PRILOSEC) 20 MG capsule Take 1 capsule (20 mg total) by mouth daily.  . rosuvastatin (CRESTOR) 40 MG tablet Take 40 mg by mouth daily.  . tamsulosin (FLOMAX) 0.4 MG CAPS capsule Take 1 capsule (0.4 mg total) by mouth daily.  . vitamin C (ASCORBIC ACID) 500 MG tablet Take 500 mg by mouth daily.  . Na Sulfate-K Sulfate-Mg Sulf 17.5-3.13-1.6 GM/180ML SOLN Take as directed for colonoscopy.  . [DISCONTINUED] gabapentin (NEURONTIN) 300 MG capsule Take by  mouth.  . [DISCONTINUED] traMADol (ULTRAM) 50 MG tablet Take by mouth.   No facility-administered encounter medications on file as of 08/16/2017.    Allergies  Allergen Reactions  . Codeine Nausea Only  . Tetracycline Rash   Patient Active Problem List   Diagnosis Date Noted  . Iron deficiency anemia 08/10/2017  . Anemia 08/08/2017  . Myalgia 08/07/2017  . Elevated PSA 07/10/2017  . Varicose veins of both lower extremities with inflammation 06/13/2017  . Counseling  regarding advanced directives and goals of care 01/02/2017  . S/P total knee arthroplasty 09/19/2016  . Chronic kidney disease 12/18/2015  . IFG (impaired fasting glucose) 12/18/2015  . Thyroid activity decreased 12/18/2015  . Renal mass 08/17/2015  . Prostate cancer screening 02/05/2015  . H/O total knee replacement 08/28/2014  . Arthritis of knee, degenerative 05/05/2014  . Depression 08/28/2012  . Coronary artery disease   . Hypertension   . Hyperlipidemia   . AAA (abdominal aortic aneurysm) [facility])    Social History   Social History  . Marital status: Married    Spouse name: N/A  . Number of children: N/A  . Years of education: N/A   Occupational History  . retired    Social History Main Topics  . Smoking status: Former Smoker    Packs/day: 1.00    Years: 35.00    Types: Cigarettes    Quit date: 12/16/2005  . Smokeless tobacco: Current User    Types: Chew     Comment: pt states he very rarely uses smokeless tobacco   . Alcohol use No  . Drug use: No  . Sexual activity: Yes    Partners: Female   Other Topics Concern  . Not on file   Social History Narrative  . No narrative on file    Mr. Zimmerle family history includes Depression in his maternal grandfather and maternal grandmother; Heart attack in his father, mother, and unknown relative; Heart disease in his mother and unknown relative; Hypertension in his father and mother; Kidney disease in his brother.      Objective:    Vitals:   08/16/17 1000  BP: 96/60  Pulse: 85    Physical Exam  well-developed older white male in no acute distress, accompanied by his wife both very pleasant blood pressure 96/60 pulse 85, height 5 foot 11, weight 212, BMI 29.5. HEENT; nontraumatic normocephalic EOMI PERRLA sclera anicteric, Cardiovascular; regular rate and rhythm with S1-S2 no murmur rub or gallop is a sternal incisional scar, Pulmonary; clear bilaterally, Abdomen; soft, nontender nondistended bowel sounds are active  there is no palpable mass or hepatosplenomegaly midline incisional scar, Rectal ;exam not done patient states this was done by PCP and negative, Ext; no clubbing cyanosis or edema skin warm and dry, Neuropsych; mood and affect appropriate       Assessment & Plan:   #71 66 year old white male with new iron deficiency anemia, recently heme-negative per PCP. Patient has noted small volume hematochezia 3 over the past several months otherwise asymptomatic from a GI standpoint. Rule out occult lower versus upper GI lesion, neoplasm AVMs. #2 previous GI workup 2016 done in [place] reasons that are not clear today, will obtain records #3 coronary artery disease status post CABG #4 hypertension history #5 status post abdominal aortic aneurysm repair 2008 #5 chronic kidney disease secondary to polycystic [place]ise #6 hyperlipidemia #7 depression #8 diffuse myalgias-question statin related no improvement with changing drugs, polymyositis or other autoimmune inflammatory process  Plan; Will repeat hemoglobin today  and will need to be followed serially Continue oral iron supplement/Slow Fe  daily Patient will be scheduled for colonoscopy and EGD with Dr. Havery Moros. Procedure discussed in detail with patient and his wife, including risks and benefits and they are agreeable to proceed  Have requested records from [facility]/Dr Oh Will also check sedimentation rate and CK today  Amy S Esterwood PA-C 08/16/2017   Cc: Kathrine Haddock, NP

## 2017-08-17 ENCOUNTER — Encounter: Payer: Self-pay | Admitting: Gastroenterology

## 2017-08-29 ENCOUNTER — Other Ambulatory Visit: Payer: Medicare HMO

## 2017-08-29 DIAGNOSIS — R972 Elevated prostate specific antigen [PSA]: Secondary | ICD-10-CM

## 2017-08-30 LAB — PSA: Prostate Specific Ag, Serum: 1.7 ng/mL (ref 0.0–4.0)

## 2017-08-31 ENCOUNTER — Ambulatory Visit (AMBULATORY_SURGERY_CENTER): Payer: Medicare HMO | Admitting: Gastroenterology

## 2017-08-31 ENCOUNTER — Encounter: Payer: Self-pay | Admitting: Gastroenterology

## 2017-08-31 VITALS — BP 106/61 | HR 93 | Temp 98.2°F | Resp 16 | Ht 71.0 in | Wt 212.0 lb

## 2017-08-31 DIAGNOSIS — I2510 Atherosclerotic heart disease of native coronary artery without angina pectoris: Secondary | ICD-10-CM | POA: Diagnosis not present

## 2017-08-31 DIAGNOSIS — E039 Hypothyroidism, unspecified: Secondary | ICD-10-CM | POA: Diagnosis not present

## 2017-08-31 DIAGNOSIS — K208 Other esophagitis: Secondary | ICD-10-CM | POA: Diagnosis not present

## 2017-08-31 DIAGNOSIS — D508 Other iron deficiency anemias: Secondary | ICD-10-CM | POA: Diagnosis not present

## 2017-08-31 DIAGNOSIS — D120 Benign neoplasm of cecum: Secondary | ICD-10-CM

## 2017-08-31 DIAGNOSIS — K920 Hematemesis: Secondary | ICD-10-CM | POA: Diagnosis not present

## 2017-08-31 DIAGNOSIS — K2950 Unspecified chronic gastritis without bleeding: Secondary | ICD-10-CM | POA: Diagnosis not present

## 2017-08-31 DIAGNOSIS — I10 Essential (primary) hypertension: Secondary | ICD-10-CM | POA: Diagnosis not present

## 2017-08-31 DIAGNOSIS — D509 Iron deficiency anemia, unspecified: Secondary | ICD-10-CM | POA: Diagnosis not present

## 2017-08-31 LAB — ANEMIA PROFILE A
Basophils Absolute: 0 10*3/uL (ref 0.0–0.2)
Basos: 0 %
EOS (ABSOLUTE): 0.3 10*3/uL (ref 0.0–0.4)
EOS: 5 %
HEMATOCRIT: 31.3 % — AB (ref 37.5–51.0)
HEMOGLOBIN: 9.3 g/dL — AB (ref 13.0–17.7)
IMMATURE GRANS (ABS): 0 10*3/uL (ref 0.0–0.1)
IRON SATURATION: 4 % — AB (ref 15–55)
Immature Granulocytes: 0 %
Iron: 17 ug/dL — ABNORMAL LOW (ref 38–169)
LYMPHS: 43 %
Lymphocytes Absolute: 3.1 10*3/uL (ref 0.7–3.1)
MCH: 22.2 pg — ABNORMAL LOW (ref 26.6–33.0)
MCHC: 29.7 g/dL — ABNORMAL LOW (ref 31.5–35.7)
MCV: 75 fL — AB (ref 79–97)
MONOCYTES: 6 %
Monocytes Absolute: 0.4 10*3/uL (ref 0.1–0.9)
NEUTROS ABS: 3.3 10*3/uL (ref 1.4–7.0)
Neutrophils: 46 %
Platelets: 241 10*3/uL (ref 150–379)
RBC: 4.18 x10E6/uL (ref 4.14–5.80)
RDW: 18.8 % — ABNORMAL HIGH (ref 12.3–15.4)
RETIC CT PCT: 1.2 % (ref 0.6–2.6)
Total Iron Binding Capacity: 420 ug/dL (ref 250–450)
UIBC: 403 ug/dL — ABNORMAL HIGH (ref 111–343)
WBC: 7.1 10*3/uL (ref 3.4–10.8)

## 2017-08-31 LAB — SPECIMEN STATUS REPORT

## 2017-08-31 MED ORDER — SODIUM CHLORIDE 0.9 % IV SOLN: 500.0000 mL | INTRAVENOUS | Status: DC

## 2017-08-31 NOTE — Progress Notes (Signed)
Upon admission pt. Noted with multiple band aids to bilateral arms,pt. Informed staff that he had fallen in his yard when cleaning limbs up from strom  we had  Last.

## 2017-08-31 NOTE — Progress Notes (Signed)
Called to room to assist during endoscopic procedure.  Patient ID and intended procedure confirmed with present staff. Received instructions for my participation in the procedure from the performing physician.  

## 2017-08-31 NOTE — Op Note (Signed)
[facility] Patient Name: Stuart Harvey Procedure Date: 08/31/2017 7:23 AM MRN: [id number] Endoscopist: Remo Lipps P. Armbruster MD, MD Age: 66 Referring MD:  Date of Birth: Feb 26, 1951 Gender: Male Account #: [id number] Procedure:                Upper GI endoscopy Indications:              Iron deficiency anemia Medicines:                Monitored Anesthesia Care Procedure:                Pre-Anesthesia Assessment:                           - Prior to the procedure, a History and Physical                            was performed, and patient medications and                            allergies were reviewed. The patient's tolerance of                            previous anesthesia was also reviewed. The risks                            and benefits of the procedure and the sedation                            options and risks were discussed with the patient.                            All questions were answered, and informed consent                            was obtained. Prior Anticoagulants: The patient has                            taken no previous anticoagulant or antiplatelet                            agents. ASA Grade Assessment: III - A patient with                            severe systemic disease. After reviewing the risks                            and benefits, the patient was deemed in                            satisfactory condition to undergo the procedure.                           After obtaining informed consent, the endoscope was  passed under direct vision. Throughout the                            procedure, the patient's blood pressure, pulse, and                            oxygen saturations were monitored continuously. The                            Endoscope was introduced through the mouth, and                            advanced to the second part of duodenum. The upper                            GI endoscopy was  accomplished without difficulty.                            The patient tolerated the procedure well. Scope In: Scope Out: Findings:                 Esophagogastric landmarks were identified: the                            Z-line was found at 38 cm, the gastroesophageal                            junction was found at 39 cm and the upper extent of                            the gastric folds was found at 40 cm from the                            incisors.                           A 1 cm hiatal hernia was present.                           The Z-line was irregular with a roughly 1cm tongue                            of salmon colored mucosa without nodularity.                            Biopsies were taken with a cold forceps for                            histology.                           The exam of the esophagus was otherwise normal.                           The entire  examined stomach was normal. Biopsies                            were taken from the antrum, body, and incisura with                            a cold forceps biopsy for H pylori testing.                           The duodenal bulb and second portion of the                            duodenum were normal. Complications:            No immediate complications. Estimated blood loss:                            Minimal. Estimated Blood Loss:     Estimated blood loss was minimal. Impression:               - Esophagogastric landmarks identified.                           - 1 cm hiatal hernia.                           - Z-line irregular. Biopsied.                           - Normal stomach. Biopsies taken to rule out H                            pylori given iron deficiency.                           - Normal duodenal bulb and second portion of the                            duodenum. Recommendation:           - Patient has a contact number available for                            emergencies. The signs and symptoms of  potential                            delayed complications were discussed with the                            patient. Return to normal activities tomorrow.                            Written discharge instructions were provided to the                            patient.                           -  Resume previous diet.                           - Continue present medications.                           - Await pathology results - consideration for                            capsule endoscopy versus enterography study to                            further evaluate the small bowel Steven P. Armbruster MD, MD 08/31/2017 8:20:58 AM This report has been signed electronically.

## 2017-08-31 NOTE — Patient Instructions (Signed)
YOU HAD AN ENDOSCOPIC PROCEDURE TODAY AT [facility]:   Refer to the procedure report that was given to you for any specific questions about what was found during the examination.  If the procedure report does not answer your questions, please call your gastroenterologist to clarify.  If you requested that your care partner not be given the details of your procedure findings, then the procedure report has been included in a sealed envelope for you to review at your convenience later.  YOU SHOULD EXPECT: Some feelings of bloating in the abdomen. Passage of more gas than usual.  Walking can help get rid of the air that was put into your GI tract during the procedure and reduce the bloating. If you had a lower endoscopy (such as a colonoscopy or flexible sigmoidoscopy) you may notice spotting of blood in your stool or on the toilet paper. If you underwent a bowel prep for your procedure, you may not have a normal bowel movement for a few days.  Please Note:  You might notice some irritation and congestion in your nose or some drainage.  This is from the oxygen used during your procedure.  There is no need for concern and it should clear up in a day or so.  SYMPTOMS TO REPORT IMMEDIATELY:   Following lower endoscopy (colonoscopy or flexible sigmoidoscopy):  Excessive amounts of blood in the stool  Significant tenderness or worsening of abdominal pains  Swelling of the abdomen that is new, acute  Fever of 100F or higher   Following upper endoscopy (EGD)  Vomiting of blood or coffee ground material  New chest pain or pain under the shoulder blades  Painful or persistently difficult swallowing  New shortness of breath  Fever of 100F or higher  Black, tarry-looking stools  For urgent or emergent issues, a gastroenterologist can be reached at any hour by calling [phone].   DIET:  We do recommend a small meal at first, but then you may proceed to your regular diet.  Drink  plenty of fluids but you should avoid alcoholic beverages for 24 hours.  ACTIVITY:  You should plan to take it easy for the rest of today and you should NOT DRIVE or use heavy machinery until tomorrow (because of the sedation medicines used during the test).    FOLLOW UP: Our staff will call the number listed on your records the next business day following your procedure to check on you and address any questions or concerns that you may have regarding the information given to you following your procedure. If we do not reach you, we will leave a message.  However, if you are feeling well and you are not experiencing any problems, there is no need to return our call.  We will assume that you have returned to your regular daily activities without incident.  If any biopsies were taken you will be contacted by phone or by letter within the next 1-3 weeks.  Please call us at [phone] if you have not heard about the biopsies in 3 weeks.    SIGNATURES/CONFIDENTIALITY: You and/or your care partner have signed paperwork which will be entered into your electronic medical record.  These signatures attest to the fact that that the information above on your After Visit Summary has been reviewed and is understood.  Full responsibility of the confidentiality of this discharge information lies with you and/or your care-partner.   Hiatal hernia information given. Await biopsy results for upper endoscopy  biopsy.  Polyp, diverticulosis, high fiber diet, and hemorrhoid information given.

## 2017-08-31 NOTE — Op Note (Signed)
[facility] Patient Name: Stuart Harvey Procedure Date: 08/31/2017 7:22 AM MRN: [id number] Endoscopist: Remo Lipps P. Liborio Saccente MD, MD Age: 66 Referring MD:  Date of Birth: 1951-01-09 Gender: Male Account #: [id number] Procedure:                Colonoscopy Indications:              Iron deficiency anemia Medicines:                Monitored Anesthesia Care Procedure:                Pre-Anesthesia Assessment:                           - Prior to the procedure, a History and Physical                            was performed, and patient medications and                            allergies were reviewed. The patient's tolerance of                            previous anesthesia was also reviewed. The risks                            and benefits of the procedure and the sedation                            options and risks were discussed with the patient.                            All questions were answered, and informed consent                            was obtained. Prior Anticoagulants: The patient has                            taken no previous anticoagulant or antiplatelet                            agents. ASA Grade Assessment: III - A patient with                            severe systemic disease. After reviewing the risks                            and benefits, the patient was deemed in                            satisfactory condition to undergo the procedure.                           After obtaining informed consent, the colonoscope  was passed under direct vision. Throughout the                            procedure, the patient's blood pressure, pulse, and                            oxygen saturations were monitored continuously. The                            Colonoscope was introduced through the anus and                            advanced to the the terminal ileum, with                            identification of the appendiceal  orifice and IC                            valve. The colonoscopy was performed without                            difficulty. The patient tolerated the procedure                            well. The quality of the bowel preparation was                            adequate. The terminal ileum, ileocecal valve,                            appendiceal orifice, and rectum were photographed. Scope In: 7:45:56 AM Scope Out: 8:10:57 AM Scope Withdrawal Time: 0 hours 21 minutes 1 second  Total Procedure Duration: 0 hours 25 minutes 1 second  Findings:                 The perianal and digital rectal examinations were                            normal.                           The terminal ileum appeared normal.                           Three sessile polyps were found in the cecum. The                            polyps were 3 to 6 mm in size. These polyps were                            removed with a cold snare. Resection and retrieval                            were complete.  Multiple medium-mouthed diverticula were found in                            the left colon.                           Internal hemorrhoids were found during retroflexion.                           The bowel prep was only fair upon insertion, time                            was taken to lavage the colon to achieve adequate                            views. The exam was otherwise without abnormality. Complications:            No immediate complications. Estimated blood loss:                            Minimal. Estimated Blood Loss:     Estimated blood loss was minimal. Impression:               - The examined portion of the ileum was normal.                           - Three 3 to 6 mm polyps in the cecum, removed with                            a cold snare. Resected and retrieved.                           - Diverticulosis in the left colon.                           - Internal hemorrhoids.                            - The examination was otherwise normal.                           No cause for iron deficiency noted on this exam. Recommendation:           - Patient has a contact number available for                            emergencies. The signs and symptoms of potential                            delayed complications were discussed with the                            patient. Return to normal activities tomorrow.  Written discharge instructions were provided to the                            patient.                           - Resume previous diet.                           - Continue present medications.                           - Await pathology results.                           - Repeat colonoscopy is recommended for                            surveillance. The colonoscopy date will be                            determined after pathology results from today's                            exam become available for review.                           - No ibuprofen, naproxen, or other non-steroidal                            anti-inflammatory drugs for 2 weeks after polyp                            removal.                           - Await EGD pathology, consideration for capsule                            endoscopy versus enterography study for further                            evaluation of anemia [place] Alfredo P. Alvino Lechuga MD, MD 08/31/2017 8:17:33 AM This report has been signed electronically.

## 2017-08-31 NOTE — Progress Notes (Signed)
To recovery, report to RN, VSS. 

## 2017-09-01 ENCOUNTER — Telehealth: Payer: Self-pay

## 2017-09-01 ENCOUNTER — Telehealth: Payer: Self-pay | Admitting: Gastroenterology

## 2017-09-01 NOTE — Telephone Encounter (Signed)
  Follow up Call-  Call back number 08/31/2017  Post procedure Call Back phone  # [phone]  Permission to leave phone message Yes  Some recent data might be hidden     Attempted to leave a  Message but mailbox was full.

## 2017-09-01 NOTE — Telephone Encounter (Signed)
Patient returned our phone call from this morning saying that he was felling fine and that everything was ok.

## 2017-09-01 NOTE — Telephone Encounter (Signed)
  Follow up Call-  Call Stuart Harvey number 08/31/2017  Post procedure Call Stuart Harvey phone  # [phone]  Permission to leave phone message Yes  Some recent data might be hidden     Patient questions:  Do you have a fever, pain , or abdominal swelling? No. Pain Score  0 *  Have you tolerated food without any problems? Yes.    Have you been able to return to your normal activities? Yes.    Do you have any questions about your discharge instructions: Diet   No. Medications  No. Follow up visit  No.  Do you have questions or concerns about your Care? No.  Actions: * If pain score is 4 or above: No action needed, pain <4.

## 2017-09-05 ENCOUNTER — Ambulatory Visit: Payer: Medicare HMO | Admitting: Urology

## 2017-09-05 ENCOUNTER — Encounter: Payer: Self-pay | Admitting: Urology

## 2017-09-05 VITALS — BP 94/56 | HR 91 | Ht 71.0 in | Wt 203.8 lb

## 2017-09-05 DIAGNOSIS — N2889 Other specified disorders of kidney and ureter: Secondary | ICD-10-CM

## 2017-09-05 DIAGNOSIS — N183 Chronic kidney disease, stage 3 unspecified: Secondary | ICD-10-CM

## 2017-09-05 DIAGNOSIS — R972 Elevated prostate specific antigen [PSA]: Secondary | ICD-10-CM | POA: Diagnosis not present

## 2017-09-05 NOTE — Progress Notes (Signed)
Stuart Harvey 07/01/1951 [id number]  Referring provider: Kathrine Haddock, NP [address], [place] [address]  CC: Follow up Left Cystic Renal Mass, Elevated PSA, Stage 3 Renal Insufficiency  HPI:    1 - Left Cystic Renal Mass - 9.5cm conglomerate of lower pole cysts w/o complex features or mass effect stable on imaging since 2008 including contrast MRI 2016.  Recent Surveillance: 2018 Renal US - stable LLP cysts 9.5cm  2 -  Elevated PSA - No FHX prostate cancer. He has substantial CV comorbidity.  2017 PSA 1.9 06/2017 PSA 5.1 / DRE 50gm smooth ==> Recheck 08/2017 1.7.  3 - Stage 3 Renal Insufficiency - Cr 1.3's at baseline. Imaging x many w/o hydro.  4 - Lower Urinary Tract Symptoms / Urinary Urgency - slowly progressive bother from mix of irritative and obstructive sympotms. DRE 2018 50gm. Placed on tamsulosin and very happy with result.   PMH sig for CAD/CABG (not limiting whatsoever), AAA / Aortobifem, HLD. No blood thinners.  Today "Stuart Harvey" is seen in f/u above after PSA recheck which is fortunately back at baseline. He is also very happy with LUTS improvement on tamsulosin.    PMH: Past Medical History:  Diagnosis Date  . AAA (abdominal aortic aneurysm) ([place])    s/p surgical repair in 08/2007 by Dr. Hulda Humphrey  . Arthritis    both knees  . Chronic kidney disease   . Coronary artery disease    CABG at [facility] in 06/2007 for 3 vessel CAD, LIMA to LAD, SVG to RPDA, SVG to OM2 and SVG to D2.  Cath in 2011 showed patent grafts. EF 40%  . Depression   . Hernia of abdominal cavity   . History of heart attack    "silent heart attack"  . Hyperlipidemia   . Hypertension   . Hypothyroidism   . Myocardial infarction [facility])     Surgical History: Past Surgical History:  Procedure Laterality Date  . ABDOMINAL AORTIC ANEURYSM REPAIR  08/2007  . ABDOMINAL AORTIC ANEURYSM REPAIR  11/2012   [facility]  . CARDIAC CATHETERIZATION  2008, 2011   [facility]   . CORONARY ARTERY BYPASS GRAFT  2008  .  EXPLORATORY LAPAROTOMY  1997  . HERNIA REPAIR    . JOINT REPLACEMENT     right knee  . KNEE ARTHROPLASTY Left 09/19/2016   Procedure: COMPUTER ASSISTED TOTAL KNEE ARTHROPLASTY;  Surgeon: Dereck Leep, MD;  Location: [facility] ORS;  Service: Orthopedics;  Laterality: Left;  . TOTAL KNEE ARTHROPLASTY Right     Home Medications:  Allergies as of 07/31/2017      Reactions   Codeine Nausea Only   Tetracycline Rash      Medication List       Accurate as of 07/31/17 10:45 AM. Always use your most recent med list.          aspirin 81 MG tablet Take 81 mg by mouth daily.   buPROPion 200 MG 12 hr tablet Commonly known as:  WELLBUTRIN SR Take 1 tablet (200 mg total) by mouth 2 (two) times daily.   citalopram 40 MG tablet Commonly known as:  CELEXA Take 1 tablet (40 mg total) by mouth daily.   D3 SUPER STRENGTH 2000 units Caps Generic drug:  Cholecalciferol Take 1 capsule by mouth daily.   gabapentin 300 MG capsule Commonly known as:  NEURONTIN Take by mouth.   levothyroxine 50 MCG tablet Commonly known as:  SYNTHROID, LEVOTHROID Take 1 tablet (50 mcg total) by mouth daily.  lisinopril 5 MG tablet Commonly known as:  PRINIVIL,ZESTRIL Take 1 tablet (5 mg total) by mouth daily.   metoprolol tartrate 25 MG tablet Commonly known as:  LOPRESSOR Take 0.5 tablets (12.5 mg total) by mouth 2 (two) times daily.   omeprazole 20 MG capsule Commonly known as:  PRILOSEC Take 1 capsule (20 mg total) by mouth daily.   traMADol 50 MG tablet Commonly known as:  ULTRAM Take by mouth.       Allergies:  Allergies  Allergen Reactions  . Codeine Nausea Only  . Tetracycline Rash    Family History: Family History  Problem Relation Age of Onset  . Heart disease Mother   . Heart attack Mother   . Hypertension Mother   . Heart attack Father   . Hypertension Father   . Kidney disease Brother   . Depression Maternal Grandmother   . Depression Maternal Grandfather   . Heart  disease Unknown   . Heart attack Unknown   . Prostate cancer Neg Hx   . Kidney cancer Neg Hx     Social History:  reports that he quit smoking about 11 years ago. His smoking use included Cigarettes. He has a 35.00 pack-year smoking history. His smokeless tobacco use includes Chew. He reports that he does not drink alcohol or use drugs.    Review of Systems  Gastrointestinal (upper)  : Negative for upper GI symptoms  Gastrointestinal (lower) : Negative for lower GI symptoms  Constitutional : Negative for symptoms  Skin: Negative for skin symptoms  Eyes: Negative for eye symptoms  Ear/Nose/Throat : Negative for Ear/Nose/Throat symptoms  Hematologic/Lymphatic: Negative for Hematologic/Lymphatic symptoms  Cardiovascular : Negative for cardiovascular symptoms  Respiratory : Negative for respiratory symptoms  Endocrine: Negative for endocrine symptoms  Musculoskeletal: Negative for musculoskeletal symptoms  Neurological: Negative for neurological symptoms  Psychologic: Negative for psychiatric symptoms   Physical Exam: There were no vitals taken for this visit.  Constitutional:  Alert and oriented, No acute distress. HEENT: [place] AT, moist mucus membranes.  Trachea midline, no masses. Cardiovascular: No clubbing, cyanosis, or edema. Respiratory: Normal respiratory effort, no increased work of breathing. GI: Abdomen is soft, nontender, nondistended, no abdominal masses GU: No CVA tenderness. DRE 50gm smooth.  Skin: No rashes, bruises or suspicious lesions. Lymph: No cervical or inguinal adenopathy. Neurologic: Grossly intact, no focal deficits, moving all 4 extremities. Psychiatric: Normal mood and affect.  Laboratory Data: Lab Results  Component Value Date   WBC 8.9 09/21/2016   HGB 8.5 (L) 09/21/2016   HCT 25.9 (L) 09/21/2016   MCV 85.1 09/21/2016   PLT 133 (L) 09/21/2016    Lab Results  Component Value Date   CREATININE 1.34 (H) 07/07/2017     No results found for: PSA  No results found for: TESTOSTERONE  Lab Results  Component Value Date   HGBA1C 5.7 (H) 07/01/2016    Urinalysis    Component Value Date/Time   COLORURINE YELLOW (A) 09/06/2016 0852   APPEARANCEUR CLEAR (A) 09/06/2016 0852   APPEARANCEUR Clear 07/01/2016 1348   LABSPEC 1.016 09/06/2016 0852   LABSPEC 1.014 02/20/2015 1933   PHURINE 5.0 09/06/2016 0852   GLUCOSEU NEGATIVE 09/06/2016 0852   GLUCOSEU Negative 02/20/2015 1933   HGBUR NEGATIVE 09/06/2016 [place] 09/06/2016 0852   BILIRUBINUR Negative 07/01/2016 1348   BILIRUBINUR Negative 02/20/2015 1933   KETONESUR NEGATIVE 09/06/2016 0852   PROTEINUR NEGATIVE 09/06/2016 0852   NITRITE NEGATIVE 09/06/2016 0852   LEUKOCYTESUR NEGATIVE 09/06/2016  9539   LEUKOCYTESUR Negative 07/01/2016 1348   LEUKOCYTESUR Negative 02/20/2015 1933    Pertinent Imaging: as per HPI  Assessment & Plan:    1 - Left Cystic Renal Mass - stable for over 10 years. Very low chance of clinically significant neoplasm. Do favor continued yearly [place] surveillance given size of lesion to r/o progresison to mass effect.   2 -  Elevated PSA -  Transient, PSA now back to baseline, continue yearly screening until age 59, but certainly no furhter given his comorbidity.   3 - Stage 3 Renal Insufficiency - stable medical renal disease. Reinforced importanct of BP and glycemic control.  4 - Lower Urinary Tract Symptoms - improved on tamsulosin, continue.  RTC 1 year with PSA, PVR, Renal US.    [name] Frock, [facility] [address], [place], [place] [address] [phone]

## 2017-09-06 ENCOUNTER — Other Ambulatory Visit: Payer: Self-pay

## 2017-09-06 DIAGNOSIS — D509 Iron deficiency anemia, unspecified: Secondary | ICD-10-CM

## 2017-09-08 ENCOUNTER — Ambulatory Visit: Payer: Self-pay | Admitting: Gastroenterology

## 2017-09-11 ENCOUNTER — Telehealth: Payer: Self-pay | Admitting: Unknown Physician Specialty

## 2017-09-11 NOTE — Telephone Encounter (Signed)
Copied from [place] #[id number]. Topic: Quick Communication - See Telephone Encounter >> Sep 11, 2017  8:47 AM Stuart Harvey wrote: CRM for notification. See Telephone encounter for:  09/11/17. Patient wants a nurse to call him back to see if he can stop taking CRESTOR for a little while like 2 months because he is having a lot of problems with his muscles & hurting. Stuart Harvey took him off of it for 30 days before to see if it would help & it did not.

## 2017-09-11 NOTE — Telephone Encounter (Signed)
Routing to provider for advice.

## 2017-09-11 NOTE — Telephone Encounter (Signed)
Please see message re: stopping Crestor

## 2017-09-12 DIAGNOSIS — Z96652 Presence of left artificial knee joint: Secondary | ICD-10-CM | POA: Diagnosis not present

## 2017-09-12 NOTE — Telephone Encounter (Signed)
Called and apologized to patient for taking a while to get back to him but I explained that I had to get Cheryl's approval as I do not make these decisions. Patient understood and I let him know that Malachy Mood said that this would be fine for him to do.

## 2017-09-12 NOTE — Telephone Encounter (Signed)
Stuart Harvey, patient is wanting to stop Crestor to see if it will help him. Do you approve of this?

## 2017-09-12 NOTE — Telephone Encounter (Signed)
That would be fine 

## 2017-09-12 NOTE — Telephone Encounter (Signed)
Copied from [place] [id number]. Topic: Inquiry >> Sep 12, 2017 10:16 AM Conception Chancy, NT wrote: Reason for CRM: pt is calling back wanting Julian Hy CMA to give him a call about stopping his Crestor, would like to stop it asap to see if it will help him.

## 2017-09-19 ENCOUNTER — Encounter: Payer: Self-pay | Admitting: Gastroenterology

## 2017-09-19 ENCOUNTER — Other Ambulatory Visit: Payer: Self-pay

## 2017-09-19 ENCOUNTER — Ambulatory Visit (INDEPENDENT_AMBULATORY_CARE_PROVIDER_SITE_OTHER): Payer: Medicare HMO

## 2017-09-19 DIAGNOSIS — D508 Other iron deficiency anemias: Secondary | ICD-10-CM | POA: Diagnosis not present

## 2017-09-19 MED ORDER — LEVOTHYROXINE SODIUM 50 MCG PO TABS: 50.0000 ug | ORAL_TABLET | Freq: Every day | ORAL | 0 refills | Status: DC

## 2017-09-19 MED ORDER — LISINOPRIL 5 MG PO TABS: 5.0000 mg | ORAL_TABLET | Freq: Every day | ORAL | 0 refills | Status: DC

## 2017-09-19 NOTE — Progress Notes (Signed)
Pt here for capsule endo, pt completed prep for procedure and tolerated procedure well.   Lot#  [id number] Exp:  01/17/19

## 2017-09-19 NOTE — Telephone Encounter (Signed)
Patient last seen 08/07/17.

## 2017-09-26 ENCOUNTER — Telehealth: Payer: Self-pay | Admitting: Gastroenterology

## 2017-09-26 NOTE — Telephone Encounter (Signed)
Patient advised that results are not ready and that this will usually take 2-3 weeks to get all results.

## 2017-09-28 ENCOUNTER — Other Ambulatory Visit: Payer: Self-pay | Admitting: Gastroenterology

## 2017-09-28 ENCOUNTER — Telehealth: Payer: Self-pay | Admitting: Gastroenterology

## 2017-09-28 DIAGNOSIS — D509 Iron deficiency anemia, unspecified: Secondary | ICD-10-CM

## 2017-09-28 MED ORDER — FERROUS SULFATE 325 (65 FE) MG PO TABS: 325.0000 mg | ORAL_TABLET | Freq: Two times a day (BID) | ORAL | 1 refills | Status: DC

## 2017-09-28 NOTE — Telephone Encounter (Signed)
Capsule endoscopy results:  Overall, good prep. No mass / polypoid lesions noted. Nonspecific patchy erythema and edema noted, but no ulcerations or obvious evidence of Crohn's disease. It's possible NSAID use could cause these changes.   Will discuss with patient along with recommendations.

## 2017-09-28 NOTE — Telephone Encounter (Signed)
Called patient and wife. Relayed results. He endorses taking Goody powder and ibuprofen frequently for aches / pains. I suspect this is causing changes on capsule study. Counseled him to avoid all NSAIDs. He should use tylenol as needed for pain. I will refill his iron tablets, he ran out, to take BID and repeat a CBC in 1 month which I ordered.   Of note, he has had a SBFT, MRI abdomen, and 2 capsule studies since his original CT scan in Jan 2016 which showed a ? Small bowel mass lesion. All of these follow up tests failed to show any small bowel masses. I would like to see him in clinic in 2-3 months for reassessment. If his anemia persists and does not improve with these measures, will consider additional small bowel imaging. He agreed.    Almyra Free, can you please contact him to schedule a follow up clinic visit with me. I have ordered the rest. Thanks

## 2017-09-29 NOTE — Telephone Encounter (Signed)
Stuart Harvey, can you please contact this patient and schedule follow up in office to see Dr. Havery Moros in 2-3 months. Thank you.

## 2017-09-29 NOTE — Telephone Encounter (Signed)
No answer, mailbox full. Will try again later.

## 2017-10-02 NOTE — Telephone Encounter (Signed)
Appointment scheduled for 12/04/17

## 2017-10-27 DIAGNOSIS — M7542 Impingement syndrome of left shoulder: Secondary | ICD-10-CM | POA: Diagnosis not present

## 2017-10-27 DIAGNOSIS — M25512 Pain in left shoulder: Secondary | ICD-10-CM | POA: Diagnosis not present

## 2017-12-04 ENCOUNTER — Ambulatory Visit: Payer: Medicare HMO | Admitting: Gastroenterology

## 2017-12-04 ENCOUNTER — Encounter: Payer: Self-pay | Admitting: Gastroenterology

## 2017-12-04 ENCOUNTER — Other Ambulatory Visit (INDEPENDENT_AMBULATORY_CARE_PROVIDER_SITE_OTHER): Payer: Self-pay

## 2017-12-04 VITALS — BP 90/68 | HR 68 | Ht 71.0 in | Wt 207.1 lb

## 2017-12-04 DIAGNOSIS — D509 Iron deficiency anemia, unspecified: Secondary | ICD-10-CM

## 2017-12-04 DIAGNOSIS — Z86010 Personal history of colonic polyps: Secondary | ICD-10-CM | POA: Diagnosis not present

## 2017-12-04 LAB — CBC WITH DIFFERENTIAL/PLATELET
Basophils Absolute: 0 10*3/uL (ref 0.0–0.1)
Basophils Relative: 0.5 % (ref 0.0–3.0)
EOS ABS: 0.3 10*3/uL (ref 0.0–0.7)
Eosinophils Relative: 3.2 % (ref 0.0–5.0)
HCT: 44.6 % (ref 39.0–52.0)
HEMOGLOBIN: 14.6 g/dL (ref 13.0–17.0)
LYMPHS ABS: 2.9 10*3/uL (ref 0.7–4.0)
Lymphocytes Relative: 32.3 % (ref 12.0–46.0)
MCHC: 32.8 g/dL (ref 30.0–36.0)
MCV: 88.7 fl (ref 78.0–100.0)
MONO ABS: 0.5 10*3/uL (ref 0.1–1.0)
Monocytes Relative: 6 % (ref 3.0–12.0)
NEUTROS PCT: 58 % (ref 43.0–77.0)
Neutro Abs: 5.2 10*3/uL (ref 1.4–7.7)
Platelets: 203 10*3/uL (ref 150.0–400.0)
RBC: 5.03 Mil/uL (ref 4.22–5.81)
RDW: 21 % — AB (ref 11.5–15.5)
WBC: 8.9 10*3/uL (ref 4.0–10.5)

## 2017-12-04 NOTE — Patient Instructions (Addendum)
If you are age 67 or older, your body mass index should be between 23-30. Your Body mass index is 28.89 kg/m. If this is out of the aforementioned range listed, please consider follow up with your Primary Care Provider.  If you are age 106 or younger, your body mass index should be between 19-25. Your Body mass index is 28.89 kg/m. If this is out of the aformentioned range listed, please consider follow up with your Primary Care Provider.   Please go to the lab in the basement of our building to have lab work done as you leave today.   Thank you for entrusting me with your care and for choosing [facility], Dr. [place] Cellar

## 2017-12-04 NOTE — Progress Notes (Signed)
HPI :  67 year old male here for follow-up visit for iron deficiency anemia. Patient had labs done on 08/07/2017 showing hemoglobin of 9.3, hematocrit of 31.3, MCV of 75, serum iron 17, TIBC 420 and iron saturation of 4. Reviewing his previous labs hemoglobin was 9 of 20 7. for about 11 months ago and 2 years ago hemoglobin 12.2.  See his initial intake with Amy Esterwood for complete history of prior workup. Since his last visit with [place] he had an EGD, colonoscopy and capsule endoscopy to further evaluate his anemia. Findings as below. No clear source on EGD and colonoscopy. Capsule endoscopy showed some patchy mild erythema and edema. At the time total he endorsed taking Goody powder and ibuprofen routinely for aches and pains. We suspected this was the cause of his capsule findings. I advised him to stop all NSAIDs for the most part he has been able to do that. He continues take iron twice daily. He denies any problems with his bowels. His stools are chronically dark due to iron. He denies any overt blood in his stools. He denies any abdominal pains. He denies any problems with his eating. Generally he feels much better since last seen him.  Endoscopic history: EGD 08/31/2017 - 1cm HH, 1cm irregular z-line - no evidence of Barrett's esophagus, normal stomach and small bowel -  Colonoscopy 08/31/2017 - 3 small cecal polyps c/w sessile serrated polyps, left sided diverticulosis, normal ileum, hemorrhoids Capsule endoscopy - 09/19/2017 - no polypoid / mass lesions, nonspecific erythema / edema patchy in mid to distal small bowel    Past Medical History:  Diagnosis Date  . AAA (abdominal aortic aneurysm) ([place])    s/p surgical repair in 08/2007 by Dr. Hulda Humphrey  . Anemia   . Arthritis    both knees  . Chronic kidney disease   . Coronary artery disease    CABG at [facility] in 06/2007 for 3 vessel CAD, LIMA to LAD, SVG to RPDA, SVG to OM2 and SVG to D2.  Cath in 2011 showed patent grafts. EF 40%  .  Depression   . GERD (gastroesophageal reflux disease)   . Hernia of abdominal cavity   . History of heart attack    "silent heart attack"  . Hyperlipidemia   . Hypertension   . Hypothyroidism   . Myocardial infarction [facility])      Past Surgical History:  Procedure Laterality Date  . ABDOMINAL AORTIC ANEURYSM REPAIR  08/2007  . ABDOMINAL AORTIC ANEURYSM REPAIR  11/2012   [facility]  . CARDIAC CATHETERIZATION  2008, 2011   [facility]   . CORONARY ARTERY BYPASS GRAFT  2008  . EXPLORATORY LAPAROTOMY  1997  . HERNIA REPAIR    . JOINT REPLACEMENT     right knee  . KNEE ARTHROPLASTY Left 09/19/2016   Procedure: COMPUTER ASSISTED TOTAL KNEE ARTHROPLASTY;  Surgeon: Dereck Leep, MD;  Location: [facility] ORS;  Service: Orthopedics;  Laterality: Left;  . TOTAL KNEE ARTHROPLASTY Right 09/2016   Family History  Problem Relation Age of Onset  . Heart disease Mother   . Heart attack Mother   . Hypertension Mother   . Heart attack Father   . Hypertension Father   . Kidney disease Brother   . Depression Maternal Grandmother   . Depression Maternal Grandfather   . Heart disease Unknown   . Heart attack Unknown   . Prostate cancer Neg Hx   . Kidney cancer Neg Hx   . Stomach cancer Neg Hx   .  Colon cancer Neg Hx    Social History   Tobacco Use  . Smoking status: Former Smoker    Packs/day: 1.00    Years: 35.00    Pack years: 35.00    Types: Cigarettes    Last attempt to quit: 12/16/2005    Years since quitting: 11.9  . Smokeless tobacco: Current User    Types: Chew  . Tobacco comment: pt states he very rarely uses smokeless tobacco   Substance Use Topics  . Alcohol use: No  . Drug use: No   Current Outpatient Medications  Medication Sig Dispense Refill  . aspirin 81 MG tablet Take 81 mg by mouth daily.    Marland Kitchen buPROPion (WELLBUTRIN SR) 200 MG 12 hr tablet Take 1 tablet (200 mg total) by mouth 2 (two) times daily. 180 tablet 3  . Cholecalciferol (D3 SUPER STRENGTH) 2000 UNITS CAPS Take 1 capsule  by mouth daily.     . citalopram (CELEXA) 40 MG tablet Take 1 tablet (40 mg total) by mouth daily. 90 tablet 3  . Ferrous Sulfate (SLOW FE PO) Take by mouth.    . ferrous sulfate 325 (65 FE) MG tablet Take 1 tablet (325 mg total) 2 (two) times daily with a meal by mouth. 180 tablet 1  . levothyroxine (SYNTHROID, LEVOTHROID) 50 MCG tablet Take 1 tablet (50 mcg total) daily by mouth. 90 tablet 0  . lisinopril (PRINIVIL,ZESTRIL) 5 MG tablet Take 1 tablet (5 mg total) daily by mouth. 90 tablet 0  . metoprolol tartrate (LOPRESSOR) 25 MG tablet Take 0.5 tablets (12.5 mg total) by mouth 2 (two) times daily. 90 tablet 3  . omeprazole (PRILOSEC) 20 MG capsule Take 1 capsule (20 mg total) by mouth daily. 90 capsule 3  . rosuvastatin (CRESTOR) 40 MG tablet Take 40 mg by mouth daily.    . tamsulosin (FLOMAX) 0.4 MG CAPS capsule Take 1 capsule (0.4 mg total) by mouth daily. 90 capsule 3  . vitamin C (ASCORBIC ACID) 500 MG tablet Take 500 mg by mouth daily.     No current facility-administered medications for this visit.    Allergies  Allergen Reactions  . Codeine Nausea Only  . Tetracycline Rash     Review of Systems: All systems reviewed and negative except where noted in HPI.   Lab Results  Component Value Date   WBC 8.9 12/04/2017   HGB 14.6 12/04/2017   HCT 44.6 12/04/2017   MCV 88.7 12/04/2017   PLT 203.0 12/04/2017    Lab Results  Component Value Date   CREATININE 1.34 (H) 07/07/2017   BUN 31 (H) 07/07/2017   NA 139 07/07/2017   K 5.1 07/07/2017   CL 105 07/07/2017   CO2 21 07/07/2017    Lab Results  Component Value Date   ALT 12 07/07/2017   AST 27 07/07/2017   ALKPHOS 99 07/07/2017   BILITOT 0.4 07/07/2017   CBC Latest Ref Rng & Units 12/04/2017 08/16/2017 08/07/2017  WBC 4.0 - 10.5 K/uL 8.9 9.0 7.1  Hemoglobin 13.0 - 17.0 g/dL 14.6 9.9(L) 9.3(L)  Hematocrit 39.0 - 52.0 % 44.6 32.0(L) 31.3(L)  Platelets 150.0 - 400.0 K/uL 203.0 237.0 241     Physical Exam: BP 90/68  (BP Location: Left Arm, Patient Position: Sitting, Cuff Size: Normal)   Pulse 68   Ht 5\' 11"  (1.803 m)   Wt 207 lb 2 oz (94 kg)   BMI 28.89 kg/m  Constitutional: Pleasant,well-developed, male in no acute distress. HEENT: Normocephalic and atraumatic.  Conjunctivae are normal. No scleral icterus. Neck supple.  Cardiovascular: Normal rate, regular rhythm.  Pulmonary/chest: Effort normal and breath sounds normal. No wheezing, rales or rhonchi. Abdominal: Soft, nondistended, nontender. There are no masses palpable. No hepatomegaly. Extremities: no edema Lymphadenopathy: No cervical adenopathy noted. Neurological: Alert and oriented to person place and time. Skin: Skin is warm and dry. No rashes noted. Psychiatric: Normal mood and affect. Behavior is normal.   ASSESSMENT AND PLAN: 67 year old male here for follow-up for iron deficiency anemia.  Iron deficiency anemia - presumed due to NSAID use causing changes in small bowel noted on capsule endoscopy. He has since stopped all NSAIDs and been on oral iron. His hemoglobin has risen from nines to 14.6 today, MCV of 88. General he's feeling quite well. At this time I think he can stop oral iron, and we will repeat CBC with iron studies in 3 months, to ensure stable, with the caveat that he should continue to avoid all NSAIDs. He agreed with this plan and will call me the interim with any questions.  History of colon polyps - 3 sessile serrated polyps on recent colonoscopy, surveillance exam due 08/2020  [place] Cellar, MD [facility] Pager [phone]

## 2017-12-12 DIAGNOSIS — H25813 Combined forms of age-related cataract, bilateral: Secondary | ICD-10-CM | POA: Diagnosis not present

## 2017-12-27 ENCOUNTER — Telehealth: Payer: Self-pay | Admitting: Unknown Physician Specialty

## 2017-12-27 MED ORDER — LEVOTHYROXINE SODIUM 50 MCG PO TABS: 50.0000 ug | ORAL_TABLET | Freq: Every day | ORAL | 0 refills | Status: DC

## 2017-12-27 MED ORDER — LISINOPRIL 5 MG PO TABS: 5.0000 mg | ORAL_TABLET | Freq: Every day | ORAL | 0 refills | Status: DC

## 2017-12-27 NOTE — Telephone Encounter (Signed)
Copied from [place]. Topic: Quick Communication - Rx Refill/Question >> Dec 27, 2017  4:32 PM Corie Chiquito, [place] wrote: Medication: Levothyroxine and Lisinopril   Has the patient contacted their pharmacy? Yes  Pharmacy called because the patient needs a refill on his medication.Stated that he doesn't have any more refills on his medication and he is using a new pharmacy as well. He would also like to have a 90 day supply of both medications Preferred Pharmacy (with phone number or street name): [facility] [address] [place] [phone]   Agent: Please be advised that RX refills may take up to 3 business days. We ask that you follow-up with your pharmacy.

## 2017-12-29 MED ORDER — LISINOPRIL 5 MG PO TABS: 5.0000 mg | ORAL_TABLET | Freq: Every day | ORAL | 0 refills | Status: DC

## 2017-12-29 MED ORDER — LEVOTHYROXINE SODIUM 50 MCG PO TABS: 50.0000 ug | ORAL_TABLET | Freq: Every day | ORAL | 0 refills | Status: DC

## 2017-12-29 NOTE — Telephone Encounter (Signed)
Pt. notified of the requested medications being reordered.  Verb. Understanding.

## 2017-12-29 NOTE — Telephone Encounter (Signed)
Pharmacy did not receive refill, patient checking status.

## 2017-12-29 NOTE — Telephone Encounter (Signed)
Noted the Rx was not sent through electronically when ordered, as it showed "printed".  Will reorder Levothyroxine and Lisinopril at this time.

## 2017-12-29 NOTE — Addendum Note (Signed)
Addended by: Denman George on: 12/29/2017 10:08 AM   Modules accepted: Orders

## 2018-01-10 ENCOUNTER — Telehealth: Payer: Self-pay | Admitting: Unknown Physician Specialty

## 2018-01-10 NOTE — Telephone Encounter (Signed)
Pt calling to check the status of med refill, adv refill request is still pending and it may take up to 3 business days.  Pt would like a call if the refill is approval.

## 2018-01-10 NOTE — Telephone Encounter (Signed)
Copied from [place]. Topic: Quick Communication - See Telephone Encounter >> Jan 10, 2018 12:16 PM Vernona Rieger wrote: CRM for notification. See Telephone encounter for:   01/10/18.  Melanie from [facility] called and needs new scripts for, he is transferring there from [facility] (they closed)  citalopram (CELEXA) 40 MG tablet (90 days) metoprolol tartrate (LOPRESSOR) 25 MG tablet (90 days) omeprazole (PRILOSEC) 20 MG capsule (90 days) buPROPion (WELLBUTRIN SR) 200 MG 12 hr tablet (90 days)  Fax [phone]

## 2018-01-11 NOTE — Telephone Encounter (Signed)
Pt wants to know if this will be refilled. Told him to follow up with the pharmacy.

## 2018-01-11 NOTE — Telephone Encounter (Signed)
Medications refilled as requested from [facility].

## 2018-01-12 ENCOUNTER — Other Ambulatory Visit: Payer: Self-pay

## 2018-01-12 MED ORDER — OMEPRAZOLE 20 MG PO CPDR: 20.0000 mg | DELAYED_RELEASE_CAPSULE | Freq: Every day | ORAL | 3 refills | Status: DC

## 2018-01-12 MED ORDER — METOPROLOL TARTRATE 25 MG PO TABS: 12.5000 mg | ORAL_TABLET | Freq: Two times a day (BID) | ORAL | 3 refills | Status: DC

## 2018-01-12 MED ORDER — CITALOPRAM HYDROBROMIDE 40 MG PO TABS: 40.0000 mg | ORAL_TABLET | Freq: Every day | ORAL | 3 refills | Status: DC

## 2018-01-12 MED ORDER — BUPROPION HCL ER (SR) 200 MG PO TB12: 200.0000 mg | ORAL_TABLET | Freq: Two times a day (BID) | ORAL | 3 refills | Status: DC

## 2018-01-12 NOTE — Telephone Encounter (Signed)
Patient was using [facility] but has switched to [name] since [facility] closed. Patient needs medications sent to [facility].

## 2018-03-05 ENCOUNTER — Telehealth: Payer: Self-pay

## 2018-03-05 DIAGNOSIS — D509 Iron deficiency anemia, unspecified: Secondary | ICD-10-CM

## 2018-03-05 NOTE — Telephone Encounter (Signed)
Pt called back.  Relayed results and recommendations. He expressed understanding.  Orders entered. Reminder placed for labs in 3 months. Ferrous sulfate removed from pt med list.

## 2018-03-05 NOTE — Telephone Encounter (Signed)
Called but unable to leave message - VM is full.  Future lab orders entered.

## 2018-03-05 NOTE — Telephone Encounter (Signed)
-----   Message from Roetta Sessions, [place] sent at 12/05/2017 10:21 AM EST ----- Regarding: labs due Notes recorded by Yetta Flock, MD on 12/04/2017   Jan can you please relay the following to [name]: - Hgb has risen from 9s to 14.6, which is excellent news - I think he can stop oral iron at this time, and repeat CBC with ferritin and TIBC in 3 months

## 2018-03-27 ENCOUNTER — Other Ambulatory Visit: Payer: Self-pay | Admitting: Unknown Physician Specialty

## 2018-03-28 ENCOUNTER — Other Ambulatory Visit: Payer: Self-pay

## 2018-03-28 MED ORDER — LEVOTHYROXINE SODIUM 50 MCG PO TABS: 50.0000 ug | ORAL_TABLET | Freq: Every day | ORAL | 0 refills | Status: DC

## 2018-05-14 DIAGNOSIS — I472 Ventricular tachycardia, unspecified: Secondary | ICD-10-CM

## 2018-05-14 HISTORY — DX: Ventricular tachycardia: I47.2

## 2018-05-14 HISTORY — DX: Ventricular tachycardia, unspecified: I47.20

## 2018-05-29 ENCOUNTER — Telehealth: Payer: Self-pay

## 2018-05-29 NOTE — Telephone Encounter (Signed)
-----   Message from Roetta Sessions, [place] sent at 03/05/2018  9:49 AM EDT ----- Regarding: labs due July 2019 Cbc, ferritin and TIBC due to check for iron def anemia. Pt instructed to stop taking iron in April because Hgb had normalized.  Labs to ensure stable. Labs entered. Call pt

## 2018-05-29 NOTE — Telephone Encounter (Signed)
Called and spoke to pt.  He understands to go to the lab once day this week.

## 2018-05-31 ENCOUNTER — Telehealth: Payer: Self-pay | Admitting: Gastroenterology

## 2018-05-31 DIAGNOSIS — D509 Iron deficiency anemia, unspecified: Secondary | ICD-10-CM

## 2018-05-31 NOTE — Telephone Encounter (Signed)
Put in lab orders so can be done at [facility]. Called and spoke to pt to let him know. He was very Patent attorney.

## 2018-06-02 ENCOUNTER — Emergency Department: Payer: Medicare HMO

## 2018-06-02 ENCOUNTER — Inpatient Hospital Stay
Admission: EM | Admit: 2018-06-02 | Discharge: 2018-06-08 | DRG: 280 | Disposition: A | Discharge status: Home Health | Payer: Medicare HMO | Attending: Internal Medicine | Admitting: Internal Medicine

## 2018-06-02 ENCOUNTER — Encounter: Payer: Self-pay | Admitting: Internal Medicine

## 2018-06-02 DIAGNOSIS — I214 Non-ST elevation (NSTEMI) myocardial infarction: Secondary | ICD-10-CM

## 2018-06-02 DIAGNOSIS — R001 Bradycardia, unspecified: Secondary | ICD-10-CM | POA: Diagnosis not present

## 2018-06-02 DIAGNOSIS — I472 Ventricular tachycardia, unspecified: Secondary | ICD-10-CM

## 2018-06-02 DIAGNOSIS — Z885 Allergy status to narcotic agent status: Secondary | ICD-10-CM

## 2018-06-02 DIAGNOSIS — Z8249 Family history of ischemic heart disease and other diseases of the circulatory system: Secondary | ICD-10-CM

## 2018-06-02 DIAGNOSIS — I4891 Unspecified atrial fibrillation: Secondary | ICD-10-CM | POA: Diagnosis not present

## 2018-06-02 DIAGNOSIS — R000 Tachycardia, unspecified: Secondary | ICD-10-CM | POA: Diagnosis not present

## 2018-06-02 DIAGNOSIS — Z951 Presence of aortocoronary bypass graft: Secondary | ICD-10-CM | POA: Diagnosis not present

## 2018-06-02 DIAGNOSIS — F17220 Nicotine dependence, chewing tobacco, uncomplicated: Secondary | ICD-10-CM | POA: Diagnosis present

## 2018-06-02 DIAGNOSIS — M6282 Rhabdomyolysis: Secondary | ICD-10-CM | POA: Diagnosis present

## 2018-06-02 DIAGNOSIS — J9690 Respiratory failure, unspecified, unspecified whether with hypoxia or hypercapnia: Secondary | ICD-10-CM | POA: Diagnosis not present

## 2018-06-02 DIAGNOSIS — I361 Nonrheumatic tricuspid (valve) insufficiency: Secondary | ICD-10-CM | POA: Diagnosis not present

## 2018-06-02 DIAGNOSIS — I25810 Atherosclerosis of coronary artery bypass graft(s) without angina pectoris: Secondary | ICD-10-CM | POA: Diagnosis present

## 2018-06-02 DIAGNOSIS — I959 Hypotension, unspecified: Secondary | ICD-10-CM | POA: Diagnosis present

## 2018-06-02 DIAGNOSIS — Z888 Allergy status to other drugs, medicaments and biological substances status: Secondary | ICD-10-CM

## 2018-06-02 DIAGNOSIS — X30XXXA Exposure to excessive natural heat, initial encounter: Secondary | ICD-10-CM

## 2018-06-02 DIAGNOSIS — R4182 Altered mental status, unspecified: Secondary | ICD-10-CM

## 2018-06-02 DIAGNOSIS — G9340 Encephalopathy, unspecified: Secondary | ICD-10-CM | POA: Diagnosis not present

## 2018-06-02 DIAGNOSIS — E039 Hypothyroidism, unspecified: Secondary | ICD-10-CM | POA: Diagnosis present

## 2018-06-02 DIAGNOSIS — I499 Cardiac arrhythmia, unspecified: Secondary | ICD-10-CM | POA: Diagnosis not present

## 2018-06-02 DIAGNOSIS — R739 Hyperglycemia, unspecified: Secondary | ICD-10-CM

## 2018-06-02 DIAGNOSIS — I471 Supraventricular tachycardia: Secondary | ICD-10-CM | POA: Diagnosis not present

## 2018-06-02 DIAGNOSIS — I248 Other forms of acute ischemic heart disease: Secondary | ICD-10-CM

## 2018-06-02 DIAGNOSIS — J988 Other specified respiratory disorders: Secondary | ICD-10-CM | POA: Diagnosis not present

## 2018-06-02 DIAGNOSIS — Z8679 Personal history of other diseases of the circulatory system: Secondary | ICD-10-CM

## 2018-06-02 DIAGNOSIS — R55 Syncope and collapse: Secondary | ICD-10-CM | POA: Diagnosis not present

## 2018-06-02 DIAGNOSIS — R0902 Hypoxemia: Secondary | ICD-10-CM | POA: Diagnosis not present

## 2018-06-02 DIAGNOSIS — N400 Enlarged prostate without lower urinary tract symptoms: Secondary | ICD-10-CM | POA: Diagnosis present

## 2018-06-02 DIAGNOSIS — S1081XA Abrasion of other specified part of neck, initial encounter: Secondary | ICD-10-CM | POA: Diagnosis not present

## 2018-06-02 DIAGNOSIS — I739 Peripheral vascular disease, unspecified: Secondary | ICD-10-CM | POA: Diagnosis present

## 2018-06-02 DIAGNOSIS — E860 Dehydration: Secondary | ICD-10-CM | POA: Diagnosis present

## 2018-06-02 DIAGNOSIS — F329 Major depressive disorder, single episode, unspecified: Secondary | ICD-10-CM | POA: Diagnosis present

## 2018-06-02 DIAGNOSIS — I129 Hypertensive chronic kidney disease with stage 1 through stage 4 chronic kidney disease, or unspecified chronic kidney disease: Secondary | ICD-10-CM | POA: Diagnosis present

## 2018-06-02 DIAGNOSIS — N189 Chronic kidney disease, unspecified: Secondary | ICD-10-CM

## 2018-06-02 DIAGNOSIS — J9600 Acute respiratory failure, unspecified whether with hypoxia or hypercapnia: Secondary | ICD-10-CM | POA: Diagnosis not present

## 2018-06-02 DIAGNOSIS — E785 Hyperlipidemia, unspecified: Secondary | ICD-10-CM | POA: Diagnosis present

## 2018-06-02 DIAGNOSIS — T670XXA Heatstroke and sunstroke, initial encounter: Secondary | ICD-10-CM | POA: Diagnosis not present

## 2018-06-02 DIAGNOSIS — I2489 Other forms of acute ischemic heart disease: Secondary | ICD-10-CM

## 2018-06-02 DIAGNOSIS — Z7982 Long term (current) use of aspirin: Secondary | ICD-10-CM

## 2018-06-02 DIAGNOSIS — Z79890 Hormone replacement therapy (postmenopausal): Secondary | ICD-10-CM

## 2018-06-02 DIAGNOSIS — R451 Restlessness and agitation: Secondary | ICD-10-CM | POA: Diagnosis not present

## 2018-06-02 DIAGNOSIS — I714 Abdominal aortic aneurysm, without rupture: Secondary | ICD-10-CM | POA: Diagnosis present

## 2018-06-02 DIAGNOSIS — A419 Sepsis, unspecified organism: Secondary | ICD-10-CM

## 2018-06-02 DIAGNOSIS — R4689 Other symptoms and signs involving appearance and behavior: Secondary | ICD-10-CM | POA: Diagnosis not present

## 2018-06-02 DIAGNOSIS — N183 Chronic kidney disease, stage 3 unspecified: Secondary | ICD-10-CM | POA: Diagnosis present

## 2018-06-02 DIAGNOSIS — Z96653 Presence of artificial knee joint, bilateral: Secondary | ICD-10-CM | POA: Diagnosis present

## 2018-06-02 DIAGNOSIS — J9601 Acute respiratory failure with hypoxia: Secondary | ICD-10-CM | POA: Diagnosis not present

## 2018-06-02 DIAGNOSIS — G9341 Metabolic encephalopathy: Secondary | ICD-10-CM | POA: Diagnosis not present

## 2018-06-02 DIAGNOSIS — I4581 Long QT syndrome: Secondary | ICD-10-CM | POA: Diagnosis present

## 2018-06-02 DIAGNOSIS — Z841 Family history of disorders of kidney and ureter: Secondary | ICD-10-CM

## 2018-06-02 DIAGNOSIS — N179 Acute kidney failure, unspecified: Secondary | ICD-10-CM | POA: Diagnosis present

## 2018-06-02 DIAGNOSIS — R69 Illness, unspecified: Secondary | ICD-10-CM | POA: Diagnosis not present

## 2018-06-02 DIAGNOSIS — Z881 Allergy status to other antibiotic agents status: Secondary | ICD-10-CM

## 2018-06-02 DIAGNOSIS — I252 Old myocardial infarction: Secondary | ICD-10-CM

## 2018-06-02 DIAGNOSIS — Z818 Family history of other mental and behavioral disorders: Secondary | ICD-10-CM

## 2018-06-02 DIAGNOSIS — I779 Disorder of arteries and arterioles, unspecified: Secondary | ICD-10-CM

## 2018-06-02 LAB — BLOOD GAS, ARTERIAL
ACID-BASE DEFICIT: 5.9 mmol/L — AB (ref 0.0–2.0)
BICARBONATE: 19.6 mmol/L — AB (ref 20.0–28.0)
FIO2: 60
LHR: 12 {breaths}/min
O2 Saturation: 98.6 %
PCO2 ART: 38 mmHg (ref 32.0–48.0)
PEEP/CPAP: 5 cmH2O
PH ART: 7.32 — AB (ref 7.350–7.450)
Patient temperature: 37
VT: 550 mL
pO2, Arterial: 126 mmHg — ABNORMAL HIGH (ref 83.0–108.0)

## 2018-06-02 LAB — CBC WITH DIFFERENTIAL/PLATELET
BASOS ABS: 0 10*3/uL (ref 0–0.1)
Basophils Relative: 0 %
EOS PCT: 0 %
Eosinophils Absolute: 0.1 10*3/uL (ref 0–0.7)
HEMATOCRIT: 47.2 % (ref 40.0–52.0)
Hemoglobin: 15.9 g/dL (ref 13.0–18.0)
LYMPHS ABS: 3 10*3/uL (ref 1.0–3.6)
LYMPHS PCT: 15 %
MCH: 31.9 pg (ref 26.0–34.0)
MCHC: 33.6 g/dL (ref 32.0–36.0)
MCV: 94.8 fL (ref 80.0–100.0)
MONO ABS: 0.9 10*3/uL (ref 0.2–1.0)
MONOS PCT: 5 %
NEUTROS ABS: 16.4 10*3/uL — AB (ref 1.4–6.5)
Neutrophils Relative %: 80 %
Platelets: 237 10*3/uL (ref 150–440)
RBC: 4.98 MIL/uL (ref 4.40–5.90)
RDW: 13.4 % (ref 11.5–14.5)
WBC: 20.4 10*3/uL — ABNORMAL HIGH (ref 3.8–10.6)

## 2018-06-02 LAB — CBC
HEMATOCRIT: 39 % — AB (ref 40.0–52.0)
HEMOGLOBIN: 13 g/dL (ref 13.0–18.0)
MCH: 31.8 pg (ref 26.0–34.0)
MCHC: 33.4 g/dL (ref 32.0–36.0)
MCV: 95.2 fL (ref 80.0–100.0)
Platelets: 162 10*3/uL (ref 150–440)
RBC: 4.09 MIL/uL — ABNORMAL LOW (ref 4.40–5.90)
RDW: 13.6 % (ref 11.5–14.5)
WBC: 13.7 10*3/uL — ABNORMAL HIGH (ref 3.8–10.6)

## 2018-06-02 LAB — URINE DRUG SCREEN, QUALITATIVE (ARMC ONLY)
AMPHETAMINES, UR SCREEN: NOT DETECTED
BENZODIAZEPINE, UR SCRN: NOT DETECTED
Cannabinoid 50 Ng, Ur ~~LOC~~: NOT DETECTED
Cocaine Metabolite,Ur ~~LOC~~: NOT DETECTED
MDMA (ECSTASY) UR SCREEN: NOT DETECTED
Methadone Scn, Ur: NOT DETECTED
Opiate, Ur Screen: NOT DETECTED
Phencyclidine (PCP) Ur S: NOT DETECTED
TRICYCLIC, UR SCREEN: NOT DETECTED

## 2018-06-02 LAB — CSF CELL COUNT WITH DIFFERENTIAL
EOS CSF: 0 %
Eosinophils, CSF: 0 %
LYMPHS CSF: 45 %
LYMPHS CSF: 47 %
Monocyte-Macrophage-Spinal Fluid: 46 %
Monocyte-Macrophage-Spinal Fluid: 53 %
OTHER CELLS CSF: 0
Other Cells, CSF: 0
RBC COUNT CSF: 5 /mm3 — AB (ref 0–3)
RBC Count, CSF: 117 /mm3 — ABNORMAL HIGH (ref 0–3)
SEGMENTED NEUTROPHILS-CSF: 0 %
SEGMENTED NEUTROPHILS-CSF: 9 %
TUBE #: 1
Tube #: 3
WBC, CSF: 5 /mm3 (ref 0–5)
WBC, CSF: 5 /mm3 (ref 0–5)

## 2018-06-02 LAB — URINALYSIS, COMPLETE (UACMP) WITH MICROSCOPIC
Bilirubin Urine: NEGATIVE
Glucose, UA: NEGATIVE mg/dL
KETONES UR: NEGATIVE mg/dL
Leukocytes, UA: NEGATIVE
Nitrite: NEGATIVE
PROTEIN: 100 mg/dL — AB
Specific Gravity, Urine: 1.018 (ref 1.005–1.030)
pH: 5 (ref 5.0–8.0)

## 2018-06-02 LAB — SEDIMENTATION RATE: SED RATE: 1 mm/h (ref 0–20)

## 2018-06-02 LAB — COMPREHENSIVE METABOLIC PANEL WITH GFR
ALT: 24 U/L (ref 0–44)
AST: 58 U/L — ABNORMAL HIGH (ref 15–41)
Albumin: 3.1 g/dL — ABNORMAL LOW (ref 3.5–5.0)
Alkaline Phosphatase: 70 U/L (ref 38–126)
Anion gap: 7 (ref 5–15)
BUN: 29 mg/dL — ABNORMAL HIGH (ref 8–23)
CO2: 20 mmol/L — ABNORMAL LOW (ref 22–32)
Calcium: 8.1 mg/dL — ABNORMAL LOW (ref 8.9–10.3)
Chloride: 116 mmol/L — ABNORMAL HIGH (ref 98–111)
Creatinine, Ser: 1.52 mg/dL — ABNORMAL HIGH (ref 0.61–1.24)
GFR calc Af Amer: 53 mL/min — ABNORMAL LOW
GFR calc non Af Amer: 46 mL/min — ABNORMAL LOW
Glucose, Bld: 125 mg/dL — ABNORMAL HIGH (ref 70–99)
Potassium: 4.2 mmol/L (ref 3.5–5.1)
Sodium: 143 mmol/L (ref 135–145)
Total Bilirubin: 1.1 mg/dL (ref 0.3–1.2)
Total Protein: 5.7 g/dL — ABNORMAL LOW (ref 6.5–8.1)

## 2018-06-02 LAB — COMPREHENSIVE METABOLIC PANEL
ALT: 26 U/L (ref 0–44)
AST: 55 U/L — AB (ref 15–41)
Albumin: 4.4 g/dL (ref 3.5–5.0)
Alkaline Phosphatase: 103 U/L (ref 38–126)
Anion gap: 17 — ABNORMAL HIGH (ref 5–15)
BUN: 31 mg/dL — AB (ref 8–23)
CO2: 16 mmol/L — ABNORMAL LOW (ref 22–32)
Calcium: 9.9 mg/dL (ref 8.9–10.3)
Chloride: 111 mmol/L (ref 98–111)
Creatinine, Ser: 2 mg/dL — ABNORMAL HIGH (ref 0.61–1.24)
GFR, EST AFRICAN AMERICAN: 38 mL/min — AB (ref >60)
GFR, EST NON AFRICAN AMERICAN: 33 mL/min — AB (ref >60)
Glucose, Bld: 170 mg/dL — ABNORMAL HIGH (ref 70–99)
POTASSIUM: 4.4 mmol/L (ref 3.5–5.1)
Sodium: 144 mmol/L (ref 135–145)
TOTAL PROTEIN: 8 g/dL (ref 6.5–8.1)
Total Bilirubin: 1.2 mg/dL (ref 0.3–1.2)

## 2018-06-02 LAB — TRIGLYCERIDES: TRIGLYCERIDES: 170 mg/dL — AB (ref <150)

## 2018-06-02 LAB — PROTEIN AND GLUCOSE, CSF
GLUCOSE CSF: 106 mg/dL — AB (ref 40–70)
TOTAL PROTEIN, CSF: 25 mg/dL (ref 15–45)

## 2018-06-02 LAB — TROPONIN I: Troponin I: 0.13 ng/mL (ref <0.03)

## 2018-06-02 LAB — MAGNESIUM: Magnesium: 1.9 mg/dL (ref 1.7–2.4)

## 2018-06-02 LAB — PROCALCITONIN

## 2018-06-02 LAB — LACTIC ACID, PLASMA
LACTIC ACID, VENOUS: 2.1 mmol/L — AB (ref 0.5–1.9)
Lactic Acid, Venous: 6.2 mmol/L (ref 0.5–1.9)
Lactic Acid, Venous: 1.2 mmol/L (ref 0.5–1.9)

## 2018-06-02 LAB — CK
Total CK: 841 U/L — ABNORMAL HIGH (ref 49–397)
Total CK: 2373 U/L — ABNORMAL HIGH (ref 49–397)

## 2018-06-02 LAB — GLUCOSE, CAPILLARY
Glucose-Capillary: 121 mg/dL — ABNORMAL HIGH (ref 70–99)
Glucose-Capillary: 121 mg/dL — ABNORMAL HIGH (ref 70–99)

## 2018-06-02 LAB — TSH: TSH: 6.598 u[IU]/mL — ABNORMAL HIGH (ref 0.350–4.500)

## 2018-06-02 LAB — MRSA PCR SCREENING: MRSA by PCR: NEGATIVE

## 2018-06-02 LAB — ACETAMINOPHEN LEVEL

## 2018-06-02 LAB — SALICYLATE LEVEL: Salicylate Lvl: <7 mg/dL (ref 2.8–30.0)

## 2018-06-02 LAB — ETHANOL

## 2018-06-02 LAB — PHOSPHORUS: PHOSPHORUS: 3.5 mg/dL (ref 2.5–4.6)

## 2018-06-02 MED ORDER — LACTATED RINGERS IV SOLN
INTRAVENOUS | Status: DC
  Administered 2018-06-02 – 2018-06-03 (×3): via INTRAVENOUS

## 2018-06-02 MED ORDER — FENTANYL 2500MCG IN NS 250ML (10MCG/ML) PREMIX INFUSION
25.0000 ug/h | INTRAVENOUS | Status: DC
  Administered 2018-06-02: 50 ug/h via INTRAVENOUS
  Administered 2018-06-03: 400 ug/h via INTRAVENOUS
  Filled 2018-06-02: qty 250

## 2018-06-02 MED ORDER — CHLORHEXIDINE GLUCONATE 0.12% ORAL RINSE (MEDLINE KIT): 15.0000 mL | Freq: Two times a day (BID) | OROMUCOSAL | Status: DC

## 2018-06-02 MED ORDER — PIPERACILLIN-TAZOBACTAM 3.375 G IVPB 30 MIN
3.3750 g | Freq: Once | INTRAVENOUS | Status: AC
  Administered 2018-06-02: 3.375 g via INTRAVENOUS
  Filled 2018-06-02: qty 50

## 2018-06-02 MED ORDER — MIDAZOLAM HCL 5 MG/5ML IJ SOLN
2.0000 mg | Freq: Once | INTRAMUSCULAR | Status: AC
  Administered 2018-06-02: 2 mg via INTRAVENOUS

## 2018-06-02 MED ORDER — FENTANYL 2500MCG IN NS 250ML (10MCG/ML) PREMIX INFUSION
INTRAVENOUS | Status: AC
  Administered 2018-06-02: 50 ug/h via INTRAVENOUS
  Filled 2018-06-02: qty 250

## 2018-06-02 MED ORDER — MIDAZOLAM HCL 2 MG/2ML IJ SOLN
4.0000 mg | Freq: Once | INTRAMUSCULAR | Status: AC
  Administered 2018-06-02: 4 mg via INTRAVENOUS

## 2018-06-02 MED ORDER — ONDANSETRON HCL 4 MG/2ML IJ SOLN
4.0000 mg | Freq: Four times a day (QID) | INTRAMUSCULAR | Status: DC | PRN
  Administered 2018-06-04: 4 mg via INTRAVENOUS
  Filled 2018-06-02: qty 2

## 2018-06-02 MED ORDER — MIDAZOLAM HCL 2 MG/2ML IJ SOLN
1.0000 mg | INTRAMUSCULAR | Status: DC | PRN
  Administered 2018-06-02: 2 mg via INTRAVENOUS
  Filled 2018-06-02 (×2): qty 2

## 2018-06-02 MED ORDER — BISACODYL 10 MG RE SUPP: 10.0000 mg | Freq: Every day | RECTAL | Status: DC | PRN

## 2018-06-02 MED ORDER — ENOXAPARIN SODIUM 40 MG/0.4ML ~~LOC~~ SOLN
40.0000 mg | SUBCUTANEOUS | Status: DC
  Administered 2018-06-02: 40 mg via SUBCUTANEOUS
  Filled 2018-06-02: qty 0.4

## 2018-06-02 MED ORDER — PROPOFOL 1000 MG/100ML IV EMUL
5.0000 ug/kg/min | Freq: Once | INTRAVENOUS | Status: DC
  Administered 2018-06-02: 5 ug/kg/min via INTRAVENOUS

## 2018-06-02 MED ORDER — ACETAMINOPHEN 325 MG PO TABS
650.0000 mg | ORAL_TABLET | Freq: Four times a day (QID) | ORAL | Status: DC | PRN
  Administered 2018-06-03 – 2018-06-08 (×4): 650 mg via ORAL
  Filled 2018-06-02 (×4): qty 2

## 2018-06-02 MED ORDER — MIDAZOLAM HCL 50 MG/10ML IJ SOLN
2.0000 mg/h | INTRAMUSCULAR | Status: DC
  Administered 2018-06-02: 2 mg/h via INTRAVENOUS
  Filled 2018-06-02: qty 10

## 2018-06-02 MED ORDER — IPRATROPIUM-ALBUTEROL 0.5-2.5 (3) MG/3ML IN SOLN
3.0000 mL | Freq: Four times a day (QID) | RESPIRATORY_TRACT | Status: DC
  Administered 2018-06-02 – 2018-06-03 (×3): 3 mL via RESPIRATORY_TRACT
  Filled 2018-06-02 (×2): qty 3

## 2018-06-02 MED ORDER — ORAL CARE MOUTH RINSE
15.0000 mL | OROMUCOSAL | Status: DC
  Administered 2018-06-02 – 2018-06-03 (×7): 15 mL via OROMUCOSAL

## 2018-06-02 MED ORDER — ACETAMINOPHEN 650 MG RE SUPP: 650.0000 mg | Freq: Four times a day (QID) | RECTAL | Status: DC | PRN

## 2018-06-02 MED ORDER — FENTANYL BOLUS VIA INFUSION
25.0000 ug | INTRAVENOUS | Status: DC | PRN
  Administered 2018-06-02 – 2018-06-03 (×2): 50 ug via INTRAVENOUS
  Filled 2018-06-02: qty 50

## 2018-06-02 MED ORDER — FENTANYL CITRATE (PF) 100 MCG/2ML IJ SOLN
100.0000 ug | Freq: Once | INTRAMUSCULAR | Status: AC
  Administered 2018-06-02: 100 ug via INTRAVENOUS

## 2018-06-02 MED ORDER — SODIUM CHLORIDE 0.9 % IV BOLUS
1000.0000 mL | Freq: Once | INTRAVENOUS | Status: AC
  Administered 2018-06-02: 1000 mL via INTRAVENOUS

## 2018-06-02 MED ORDER — VANCOMYCIN HCL IN DEXTROSE 1-5 GM/200ML-% IV SOLN
1000.0000 mg | Freq: Once | INTRAVENOUS | Status: AC
  Administered 2018-06-02: 1000 mg via INTRAVENOUS
  Filled 2018-06-02: qty 200

## 2018-06-02 MED ORDER — PROPOFOL 1000 MG/100ML IV EMUL
5.0000 ug/kg/min | INTRAVENOUS | Status: DC
  Administered 2018-06-03: 30 ug/kg/min via INTRAVENOUS
  Administered 2018-06-03: 20 ug/kg/min via INTRAVENOUS
  Filled 2018-06-02: qty 100

## 2018-06-02 MED ORDER — MIDAZOLAM HCL 2 MG/2ML IJ SOLN
1.0000 mg | INTRAMUSCULAR | Status: DC | PRN
  Administered 2018-06-02: 2 mg via INTRAVENOUS

## 2018-06-02 MED ORDER — VANCOMYCIN HCL 10 G IV SOLR
1250.0000 mg | INTRAVENOUS | Status: DC
  Administered 2018-06-02: 1250 mg via INTRAVENOUS
  Filled 2018-06-02 (×2): qty 1250

## 2018-06-02 MED ORDER — PIPERACILLIN-TAZOBACTAM 3.375 G IVPB
3.3750 g | Freq: Three times a day (TID) | INTRAVENOUS | Status: DC
  Administered 2018-06-03 – 2018-06-04 (×4): 3.375 g via INTRAVENOUS
  Filled 2018-06-02 (×5): qty 50

## 2018-06-02 MED ORDER — CHLORHEXIDINE GLUCONATE 0.12% ORAL RINSE (MEDLINE KIT)
15.0000 mL | Freq: Two times a day (BID) | OROMUCOSAL | Status: DC
  Administered 2018-06-02 – 2018-06-03 (×2): 15 mL via OROMUCOSAL

## 2018-06-02 MED ORDER — SILVER SULFADIAZINE 1 % EX CREA
TOPICAL_CREAM | Freq: Two times a day (BID) | CUTANEOUS | Status: DC
  Administered 2018-06-02 – 2018-06-04 (×4): via TOPICAL
  Administered 2018-06-04 – 2018-06-06 (×2): 1 via TOPICAL
  Administered 2018-06-07: 22:00:00 via TOPICAL
  Filled 2018-06-02 (×2): qty 85

## 2018-06-02 MED ORDER — SENNOSIDES 8.8 MG/5ML PO SYRP
5.0000 mL | ORAL_SOLUTION | Freq: Two times a day (BID) | ORAL | Status: DC | PRN
  Filled 2018-06-02: qty 5

## 2018-06-02 MED ORDER — FENTANYL CITRATE (PF) 100 MCG/2ML IJ SOLN
INTRAMUSCULAR | Status: AC
  Administered 2018-06-02: 100 ug via INTRAVENOUS
  Filled 2018-06-02: qty 2

## 2018-06-02 MED ORDER — NOREPINEPHRINE 4 MG/250ML-% IV SOLN
0.0000 ug/min | INTRAVENOUS | Status: DC
Start: 2018-06-02 — End: 2018-06-04
  Administered 2018-06-02: 5 ug/min via INTRAVENOUS
  Administered 2018-06-03: 3 ug/min via INTRAVENOUS
  Administered 2018-06-03: 8 ug/min via INTRAVENOUS
  Filled 2018-06-02 (×2): qty 250

## 2018-06-02 MED ORDER — IPRATROPIUM-ALBUTEROL 0.5-2.5 (3) MG/3ML IN SOLN
RESPIRATORY_TRACT | Status: AC
  Filled 2018-06-02: qty 3

## 2018-06-02 MED ORDER — PROPOFOL 1000 MG/100ML IV EMUL
INTRAVENOUS | Status: AC
  Administered 2018-06-02: 5 ug/kg/min via INTRAVENOUS
  Filled 2018-06-02: qty 100

## 2018-06-02 MED ORDER — VECURONIUM BROMIDE 10 MG IV SOLR
5.0000 mg | Freq: Once | INTRAVENOUS | Status: AC
  Administered 2018-06-02: 5 mg via INTRAVENOUS
  Filled 2018-06-02: qty 5

## 2018-06-02 MED ORDER — PROPOFOL 1000 MG/100ML IV EMUL
5.0000 ug/kg/min | Freq: Once | INTRAVENOUS | Status: AC
  Administered 2018-06-02: 10 ug/kg/min via INTRAVENOUS

## 2018-06-02 MED ORDER — LEVOTHYROXINE SODIUM 100 MCG IV SOLR
25.0000 ug | Freq: Every day | INTRAVENOUS | Status: DC
  Administered 2018-06-03: 25 ug via INTRAVENOUS
  Filled 2018-06-02: qty 5

## 2018-06-02 MED ORDER — INSULIN ASPART 100 UNIT/ML ~~LOC~~ SOLN
0.0000 [IU] | SUBCUTANEOUS | Status: DC
  Administered 2018-06-03: 1 [IU] via SUBCUTANEOUS
  Filled 2018-06-02: qty 1

## 2018-06-02 MED ORDER — PROPOFOL 10 MG/ML IV BOLUS
40.0000 mg | Freq: Once | INTRAVENOUS | Status: AC
  Administered 2018-06-02: 40 mg via INTRAVENOUS

## 2018-06-02 MED ORDER — ONDANSETRON HCL 4 MG PO TABS: 4.0000 mg | ORAL_TABLET | Freq: Four times a day (QID) | ORAL | Status: DC | PRN

## 2018-06-02 MED ORDER — FAMOTIDINE IN NACL 20-0.9 MG/50ML-% IV SOLN
20.0000 mg | Freq: Two times a day (BID) | INTRAVENOUS | Status: DC
  Administered 2018-06-02 – 2018-06-03 (×3): 20 mg via INTRAVENOUS
  Filled 2018-06-02 (×4): qty 50

## 2018-06-02 MED ORDER — MIDAZOLAM HCL 2 MG/2ML IJ SOLN
INTRAMUSCULAR | Status: AC
  Administered 2018-06-02: 4 mg via INTRAVENOUS
  Filled 2018-06-02: qty 4

## 2018-06-02 MED ORDER — SODIUM CHLORIDE 0.45 % IV SOLN
INTRAVENOUS | Status: DC
  Administered 2018-06-02: 20:00:00 via INTRAVENOUS

## 2018-06-02 MED ORDER — LACTATED RINGERS IV BOLUS: 1000.0000 mL | Freq: Once | INTRAVENOUS | Status: DC

## 2018-06-02 NOTE — ED Notes (Signed)
Xray notified of the chest xray for tube placement.

## 2018-06-02 NOTE — H&P (Signed)
History and Physical    Stuart Harvey ZDG:[id number] DOB: 06/10/1951 DOA: 06/02/2018  Referring physician: Dr. Kerman Passey PCP: Kathrine Haddock, NP  Specialists: none  Chief Complaint: altered mental status  HPI: Stuart Harvey is a 67 y.o. male has a past medical history significant for HTN, CAD, PVD, thyroid dz, and depression now with acute onset agitation and confusion leading to an unresponsive state requiring intubation in the ER. ER work up is non-diagnostic but WBC and lactic acid elevated c/w sepsis. He is now admitted. He is intubated and sedated and unable to provide hx. LP done in ER. Cultures pending  Review of Systems: unable to obtain due to sedation and intubation  Past Medical History:  Diagnosis Date  . AAA (abdominal aortic aneurysm) ([place])    s/p surgical repair in 08/2007 by Dr. Hulda Humphrey  . Anemia   . Arthritis    both knees  . Chronic kidney disease   . Coronary artery disease    CABG at [facility] in 06/2007 for 3 vessel CAD, LIMA to LAD, SVG to RPDA, SVG to OM2 and SVG to D2.  Cath in 2011 showed patent grafts. EF 40%  . Depression   . GERD (gastroesophageal reflux disease)   . Hernia of abdominal cavity   . History of heart attack    "silent heart attack"  . Hyperlipidemia   . Hypertension   . Hypothyroidism   . Myocardial infarction [facility])    Past Surgical History:  Procedure Laterality Date  . ABDOMINAL AORTIC ANEURYSM REPAIR  08/2007  . ABDOMINAL AORTIC ANEURYSM REPAIR  11/2012   [facility]  . CARDIAC CATHETERIZATION  2008, 2011   [facility]   . CORONARY ARTERY BYPASS GRAFT  2008  . EXPLORATORY LAPAROTOMY  1997  . HERNIA REPAIR    . JOINT REPLACEMENT     right knee  . KNEE ARTHROPLASTY Left 09/19/2016   Procedure: COMPUTER ASSISTED TOTAL KNEE ARTHROPLASTY;  Surgeon: Dereck Leep, MD;  Location: [facility] ORS;  Service: Orthopedics;  Laterality: Left;  . TOTAL KNEE ARTHROPLASTY Right 09/2016   Social History:  reports that he quit smoking about 12 years ago. His  smoking use included cigarettes. He has a 35.00 pack-year smoking history. His smokeless tobacco use includes chew. He reports that he does not drink alcohol or use drugs.  Allergies  Allergen Reactions  . Codeine Nausea Only  . Tetracycline Rash    Family History  Problem Relation Age of Onset  . Heart disease Mother   . Heart attack Mother   . Hypertension Mother   . Heart attack Father   . Hypertension Father   . Kidney disease Brother   . Depression Maternal Grandmother   . Depression Maternal Grandfather   . Heart disease Unknown   . Heart attack Unknown   . Prostate cancer Neg Hx   . Kidney cancer Neg Hx   . Stomach cancer Neg Hx   . Colon cancer Neg Hx     Prior to Admission medications   Medication Sig Start Date End Date Taking? Authorizing Provider  aspirin 81 MG tablet Take 81 mg by mouth daily.   Yes [provider]  buPROPion (WELLBUTRIN SR) 200 MG 12 hr tablet Take 1 tablet (200 mg total) by mouth 2 (two) times daily. 01/12/18  Yes Kathrine Haddock, NP  Cholecalciferol (D3 SUPER STRENGTH) 2000 UNITS CAPS Take 1 capsule by mouth daily.    Yes [provider]  citalopram (CELEXA) 40 MG tablet Take  1 tablet (40 mg total) by mouth daily. 01/12/18  Yes Kathrine Haddock, NP  levothyroxine (SYNTHROID, LEVOTHROID) 50 MCG tablet Take 1 tablet (50 mcg total) by mouth daily. 03/28/18  Yes Kathrine Haddock, NP  lisinopril (PRINIVIL,ZESTRIL) 5 MG tablet Take 1 tablet (5 mg total) by mouth daily. 03/27/18  Yes Kathrine Haddock, NP  metoprolol tartrate (LOPRESSOR) 25 MG tablet Take 0.5 tablets (12.5 mg total) by mouth 2 (two) times daily. 01/12/18  Yes Kathrine Haddock, NP  omeprazole (PRILOSEC) 20 MG capsule Take 1 capsule (20 mg total) by mouth daily. 01/12/18  Yes Kathrine Haddock, NP  tamsulosin (FLOMAX) 0.4 MG CAPS capsule Take 1 capsule (0.4 mg total) by mouth daily. 07/31/17  Yes [name] Frock, MD   Physical Exam: Vitals:   06/02/18 1745 06/02/18 1815 06/02/18 1830 06/02/18  1845  BP: (!) 92/48 101/83 (!) 79/57 (!) 86/52  Pulse: 84 88 78 73  Resp: 20 20 18  (!) 0  SpO2: 100% 100% 100% 100%  Weight:      Height:         General:  Acutely ill appearing, WDWN, [place]/AT  Eyes: PERRL, EOMI, no scleral icterus, conjunctiva clear  ENT: moist oropharynx without exudate, TM's benign, dentition fair  Neck: supple, no lymphadenopathy. No bruits or thyromegaly  Cardiovascular: regular rate with 2/6 systolic murmur noted. No rubs or gallops.; 2+ peripheral pulses, no JVD, no peripheral edema  Respiratory: intubated with diffuse rhonchi. No rales or wheezes. No dullness  Abdomen: soft, non tender to palpation, positive bowel sounds, no guarding, no rebound  Skin: no rashes or lesions  Musculoskeletal: normal bulk and tone, no joint swelling  Psychiatric: sedated, non-verbal  Neurologic: CN 2-12 grossly intact, Motor strength 5/5 in all 4 groups, DTR's symmetric with non-focal sensory exam  Labs on Admission:  Basic Metabolic Panel: Recent Labs  Lab 06/02/18 1641  NA 144  K 4.4  CL 111  CO2 16*  GLUCOSE 170*  BUN 31*  CREATININE 2.00*  CALCIUM 9.9   Liver Function Tests: Recent Labs  Lab 06/02/18 1641  AST 55*  ALT 26  ALKPHOS 103  BILITOT 1.2  PROT 8.0  ALBUMIN 4.4   No results for input(s): LIPASE, AMYLASE in the last 168 hours. No results for input(s): AMMONIA in the last 168 hours. CBC: Recent Labs  Lab 06/02/18 1641  WBC 20.4*  NEUTROABS 16.4*  HGB 15.9  HCT 47.2  MCV 94.8  PLT 237   Cardiac Enzymes: Recent Labs  Lab 06/02/18 1641  CKTOTAL 841*  TROPONINI 0.13*    BNP (last 3 results) No results for input(s): BNP in the last 8760 hours.  ProBNP (last 3 results) No results for input(s): PROBNP in the last 8760 hours.  CBG: No results for input(s): GLUCAP in the last 168 hours.  Radiological Exams on Admission: Ct Head Wo Contrast  Result Date: 06/02/2018 CLINICAL DATA:  Altered mental status. Multiple bruises and  abrasions. EXAM: CT HEAD WITHOUT CONTRAST CT CERVICAL SPINE WITHOUT CONTRAST TECHNIQUE: Multidetector CT imaging of the head and cervical spine was performed following the standard protocol without intravenous contrast. Multiplanar CT image reconstructions of the cervical spine were also generated. COMPARISON:  None. FINDINGS: CT HEAD FINDINGS Brain: Diffusely enlarged ventricles and subarachnoid spaces. No intracranial hemorrhage, mass lesion or CT evidence of acute infarction. Vascular: No hyperdense vessel or unexpected calcification. Skull: Normal. Negative for fracture or focal lesion. Sinuses/Orbits: Unremarkable. Other: None. CT CERVICAL SPINE FINDINGS Alignment: Mild anterolisthesis at the C2-3 and C3-4  levels. Mild retrolisthesis at the C4-5, C5-6 and C6-7 levels. Mild reversal the normal lordosis in the upper cervical spine. Mild dextroconvex cervical scoliosis. Skull base and vertebrae: No acute fracture. No primary bone lesion or focal pathologic process. Soft tissues and spinal canal: There is some fluid density posterior to the upper portion of the endotracheal tube in the posterior oropharynx. No visible canal hematoma. Disc levels: Multilevel degenerative changes, including facet degenerative changes. Upper chest: Clear lung apices. Other: Endotracheal and nasogastric tubes in place. Bilateral carotid artery calcifications. IMPRESSION: 1. No skull fracture or intracranial hemorrhage. 2. No cervical spine fracture or traumatic subluxation. 3. Mild diffuse cerebral and cerebellar atrophy. 4. Multilevel cervical spine degenerative changes with associated subluxations. 5. Bilateral carotid artery atheromatous calcifications. Electronically Signed   By: Claudie Revering M.D.   On: 06/02/2018 17:35   Ct Cervical Spine Wo Contrast  Result Date: 06/02/2018 CLINICAL DATA:  Altered mental status. Multiple bruises and abrasions. EXAM: CT HEAD WITHOUT CONTRAST CT CERVICAL SPINE WITHOUT CONTRAST TECHNIQUE:  Multidetector CT imaging of the head and cervical spine was performed following the standard protocol without intravenous contrast. Multiplanar CT image reconstructions of the cervical spine were also generated. COMPARISON:  None. FINDINGS: CT HEAD FINDINGS Brain: Diffusely enlarged ventricles and subarachnoid spaces. No intracranial hemorrhage, mass lesion or CT evidence of acute infarction. Vascular: No hyperdense vessel or unexpected calcification. Skull: Normal. Negative for fracture or focal lesion. Sinuses/Orbits: Unremarkable. Other: None. CT CERVICAL SPINE FINDINGS Alignment: Mild anterolisthesis at the C2-3 and C3-4 levels. Mild retrolisthesis at the C4-5, C5-6 and C6-7 levels. Mild reversal the normal lordosis in the upper cervical spine. Mild dextroconvex cervical scoliosis. Skull base and vertebrae: No acute fracture. No primary bone lesion or focal pathologic process. Soft tissues and spinal canal: There is some fluid density posterior to the upper portion of the endotracheal tube in the posterior oropharynx. No visible canal hematoma. Disc levels: Multilevel degenerative changes, including facet degenerative changes. Upper chest: Clear lung apices. Other: Endotracheal and nasogastric tubes in place. Bilateral carotid artery calcifications. IMPRESSION: 1. No skull fracture or intracranial hemorrhage. 2. No cervical spine fracture or traumatic subluxation. 3. Mild diffuse cerebral and cerebellar atrophy. 4. Multilevel cervical spine degenerative changes with associated subluxations. 5. Bilateral carotid artery atheromatous calcifications. Electronically Signed   By: Claudie Revering M.D.   On: 06/02/2018 17:35   Dg Chest Portable 1 View  Result Date: 06/02/2018 CLINICAL DATA:  Hypoxia EXAM: PORTABLE CHEST 1 VIEW COMPARISON:  July 12, 2010 FINDINGS: Endotracheal tube tip is 3.0 cm above the carina. Nasogastric tube tip and side port are below the diaphragm. No pneumothorax. There is no edema or  consolidation. Heart is slightly enlarged with pulmonary vascularity normal. No adenopathy. There is evidence of prior coronary artery bypass grafting. No evident bone lesions. IMPRESSION: Tube positions as described without pneumothorax. No edema or consolidation. Heart mildly enlarged with evidence of previous coronary artery bypass grafting. Electronically Signed   By: Lowella Grip III M.D.   On: 06/02/2018 17:18    EKG: Independently reviewed.  Assessment/Plan Principal Problem:   Sepsis ([place]) Active Problems:   Chronic kidney disease   Acute encephalopathy   Hyperglycemia   Will admit to ICU, intubated, with IV fluids and IV ABX. Cultures pending. Follow sugars. Consult the ICU team. Echo ordered. Repeat labs in AM.  Diet: NPO Fluids: 1/2NS @75  DVT Prophylaxis: Lovenox  Code Status: FULL  Family Communication: none  Disposition Plan: TBD  Time spent: 50 min

## 2018-06-02 NOTE — ED Notes (Signed)
Patient transported to CT 

## 2018-06-02 NOTE — Progress Notes (Signed)
CODE SEPSIS - PHARMACY COMMUNICATION  **Broad Spectrum Antibiotics should be administered within 1 hour of Sepsis diagnosis**  Time Code Sepsis Called/Page Received: 1729  Antibiotics Ordered: vancomycin and Zosyn  Time of 1st antibiotic administration: 1812  Additional action taken by pharmacy: none required  If necessary, Name of Provider/Nurse Contacted: N/A    Dallie Piles ,PharmD Clinical Pharmacist  06/02/2018  6:14 PM

## 2018-06-02 NOTE — Progress Notes (Signed)
Pharmacy Antibiotic Note  Stuart Harvey is a 67 y.o. male admitted on 06/02/2018 with sepsis.  Pharmacy has been consulted for vancomycin and Zosyn dosing. He has a past medical history significant for HTN, CAD, PVD, thyroid dz, and depression now with acute onset agitation and confusion leading to an unresponsive state requiring intubation in the ER. ER work up is non-diagnostic but WBC and lactic acid elevated c/w sepsis. He is now admitted to ICU. He is intubated and sedated and unable to provide hx. LP done in ER.   Plan: Vancomycin 1250 IV every 24 hours, 1st dose of 1000mg  was given in the ED with a stacked dose interval of 6 hours. Goal trough 15-20 mcg/mL (calculated concentrations at steady-state: 46.5/17.3 mcg/mL), K: 0.043, Vd: 50L, T1/2: 16h. Vt prior to 4th dose Zosyn 3.375g IV q8h (4 hour infusion).  Height: 6\' 2"  (188 cm) Weight: 200 lb (90.7 kg) IBW/kg (Calculated) : 82.2  No data recorded.  Recent Labs  Lab 06/02/18 1641  WBC 20.4*  CREATININE 2.00*  LATICACIDVEN 6.2*    Estimated Creatinine Clearance: 42.2 mL/min (A) (by C-G formula based on SCr of 2 mg/dL (H)).    Allergies  Allergen Reactions  . Codeine Nausea Only  . Tetracycline Rash    Antimicrobials this admission: Vancomycin 7/20 >>  Zosyn 7/20 >>  Microbiology results: 7/20 BCx: pending 7/20 UCx: pending 7/20 Lumbar puncture: pending  Thank you for allowing pharmacy to be a part of this patient's care.  Dallie Piles, PharmD 06/02/2018 8:04 PM

## 2018-06-02 NOTE — ED Notes (Signed)
Pt desaturated, MD in room during event with resaturation on vent to 100%

## 2018-06-02 NOTE — ED Notes (Signed)
CT notified of stat head scan

## 2018-06-02 NOTE — ED Notes (Signed)
Dr Doy Hutching informed of pt's blood pressure and MAP. Does not wish to order meds at this time. Will continue to monitor.

## 2018-06-02 NOTE — ED Provider Notes (Addendum)
[facility] Emergency Department Provider Note  Time seen: 4:46 PM  I have reviewed the triage vital signs and the nursing notes.   HISTORY  Chief Complaint Altered Mental Status    HPI Stuart Harvey is a 67 y.o. male with a past medical history of CKD, CABG, gastric reflux, hypertension, hyperlipidemia presents to the emergency department for altered mental status.  According to report the wife left the house around 9:00 in the morning.  Husband was working outside, she states upon returning home she found the husband rolling around on the ground yelling at times, altered, confused.  EMS was called found the patient to be diaphoretic on the ground rolling around, combative and agitated.  Found the patient to be in ventricular tachycardia and the patient was cardioverted back to a sinus tachycardia by EMS.  Upon arrival patient is agitated, altered.  Unable to follow commands or answer questions, occasionally will yell and try to unrestrained himself.  Appears to have blood on the back of his scalp.   Past Medical History:  Diagnosis Date  . AAA (abdominal aortic aneurysm) ([place])    s/p surgical repair in 08/2007 by Dr. Hulda Humphrey  . Anemia   . Arthritis    both knees  . Chronic kidney disease   . Coronary artery disease    CABG at [facility] in 06/2007 for 3 vessel CAD, LIMA to LAD, SVG to RPDA, SVG to OM2 and SVG to D2.  Cath in 2011 showed patent grafts. EF 40%  . Depression   . GERD (gastroesophageal reflux disease)   . Hernia of abdominal cavity   . History of heart attack    "silent heart attack"  . Hyperlipidemia   . Hypertension   . Hypothyroidism   . Myocardial infarction [facility])     Patient Active Problem List   Diagnosis Date Noted  . Iron deficiency anemia 08/10/2017  . Anemia 08/08/2017  . Myalgia 08/07/2017  . Elevated PSA 07/10/2017  . Varicose veins of both lower extremities with inflammation 06/13/2017  . Counseling regarding advanced directives  and goals of care 01/02/2017  . S/P total knee arthroplasty 09/19/2016  . Chronic kidney disease 12/18/2015  . IFG (impaired fasting glucose) 12/18/2015  . Thyroid activity decreased 12/18/2015  . Renal mass 08/17/2015  . Prostate cancer screening 02/05/2015  . H/O total knee replacement 08/28/2014  . Arthritis of knee, degenerative 05/05/2014  . Depression 08/28/2012  . Coronary artery disease   . Hypertension   . Hyperlipidemia   . AAA (abdominal aortic aneurysm) [facility])     Past Surgical History:  Procedure Laterality Date  . ABDOMINAL AORTIC ANEURYSM REPAIR  08/2007  . ABDOMINAL AORTIC ANEURYSM REPAIR  11/2012   [facility]  . CARDIAC CATHETERIZATION  2008, 2011   [facility]   . CORONARY ARTERY BYPASS GRAFT  2008  . EXPLORATORY LAPAROTOMY  1997  . HERNIA REPAIR    . JOINT REPLACEMENT     right knee  . KNEE ARTHROPLASTY Left 09/19/2016   Procedure: COMPUTER ASSISTED TOTAL KNEE ARTHROPLASTY;  Surgeon: Dereck Leep, MD;  Location: [facility] ORS;  Service: Orthopedics;  Laterality: Left;  . TOTAL KNEE ARTHROPLASTY Right 09/2016    Prior to Admission medications   Medication Sig Start Date End Date Taking? Authorizing Provider  aspirin 81 MG tablet Take 81 mg by mouth daily.    [provider]  buPROPion (WELLBUTRIN SR) 200 MG 12 hr tablet Take 1 tablet (200 mg total) by mouth 2 (  two) times daily. 01/12/18   Kathrine Haddock, NP  Cholecalciferol (D3 SUPER STRENGTH) 2000 UNITS CAPS Take 1 capsule by mouth daily.     [provider]  citalopram (CELEXA) 40 MG tablet Take 1 tablet (40 mg total) by mouth daily. 01/12/18   Kathrine Haddock, NP  levothyroxine (SYNTHROID, LEVOTHROID) 50 MCG tablet Take 1 tablet (50 mcg total) by mouth daily. 03/28/18   Kathrine Haddock, NP  lisinopril (PRINIVIL,ZESTRIL) 5 MG tablet Take 1 tablet (5 mg total) by mouth daily. 03/27/18   Kathrine Haddock, NP  metoprolol tartrate (LOPRESSOR) 25 MG tablet Take 0.5 tablets (12.5 mg total) by mouth 2 (two) times daily.  01/12/18   Kathrine Haddock, NP  omeprazole (PRILOSEC) 20 MG capsule Take 1 capsule (20 mg total) by mouth daily. 01/12/18   Kathrine Haddock, NP  rosuvastatin (CRESTOR) 40 MG tablet Take 40 mg by mouth daily.    [provider]  tamsulosin (FLOMAX) 0.4 MG CAPS capsule Take 1 capsule (0.4 mg total) by mouth daily. 07/31/17   [name] Frock, MD  vitamin C (ASCORBIC ACID) 500 MG tablet Take 500 mg by mouth daily.    [provider]    Allergies  Allergen Reactions  . Codeine Nausea Only  . Tetracycline Rash    Family History  Problem Relation Age of Onset  . Heart disease Mother   . Heart attack Mother   . Hypertension Mother   . Heart attack Father   . Hypertension Father   . Kidney disease Brother   . Depression Maternal Grandmother   . Depression Maternal Grandfather   . Heart disease Unknown   . Heart attack Unknown   . Prostate cancer Neg Hx   . Kidney cancer Neg Hx   . Stomach cancer Neg Hx   . Colon cancer Neg Hx     Social History Social History   Tobacco Use  . Smoking status: Former Smoker    Packs/day: 1.00    Years: 35.00    Pack years: 35.00    Types: Cigarettes    Last attempt to quit: 12/16/2005    Years since quitting: 12.4  . Smokeless tobacco: Current User    Types: Chew  . Tobacco comment: pt states he very rarely uses smokeless tobacco   Substance Use Topics  . Alcohol use: No  . Drug use: No    Review of Systems Unable to complete an adequate/accurate review of systems secondary to altered mental status. ____________________________________________   PHYSICAL EXAM:  Constitutional: Patient is agitated, eyes are open, combative at times will occasionally yell, does not speak words.  Not answering questions or following commands. Eyes: Appear to be 3 mm bilaterally ENT   Head: Patient has blood to the occipital scalp   Mouth/Throat: Dry appearing mucous membranes Cardiovascular: Regular rhythm, rate around 100 bpm.  No  obvious murmur. Respiratory: Patient has tachypnea, agitated, occasionally yelling. Gastrointestinal: Soft, no distention.  No reaction to palpation. Musculoskeletal: Patient has very skin tears and abrasions on all 4 extremities Neurologic: Patient not speaking, will occasionally yell, appears to be moving all extremities while agitated at times Skin:  Skin is warm.  Appears to have an abrasion to occipital scalp, abrasions to all extremities Psychiatric: Altered, confused and combative.  ____________________________________________    EKG  EKG reviewed and interpreted by myself shows what appears to be a sinus tachycardia at 106 bpm with a widened QRS, normal axis otherwise largely normal intervals, slight lateral ST depression.  ____________________________________________  RADIOLOGY  CT scan of the head and neck are negative Chest x-ray shows well-placed ET tube otherwise negative   ____________________________________________   INITIAL IMPRESSION / ASSESSMENT AND PLAN / ED COURSE  Pertinent labs & imaging results that were available during my care of the patient were reviewed by me and considered in my medical decision making (see chart for details).  Patient presents to the emergency department with altered mental status, confused and combative.  Differential would include closed head injury, ICH, CVA, toxicity, ingestion, infectious etiology, metabolic abnormality.  Patient is acutely confused agitated unable to speak yelling at times.  Given his agitation decision was made to intubate the patient for airway protection and to assist in our emergent work-up.  Given the occipital abrasion with altered mental status patient requires a stat CT scan of the head.  I called CT they are awaiting patient arrival.  We will check labs, CT scan of the head, urinalysis.  We also check an ABG, CK to help rule out rhabdomyolysis.  We will IV hydrate while awaiting results.   Patient  intubated by myself.  Lab work has started to result showing significant leukocytosis as well as lactate.  Broad-spectrum antibiotics ordered under sepsis protocol for the patient.  Patient's urinalysis has resulted normal, chest x-ray is clear, no obvious source of sepsis.  Given a elevated lactate with elevated white blood cell count and no obvious source I performed a lumbar puncture on the patient, CSF pending.  Patient is on propofol and Versed drips, pressures in the 80s I placed a triple-lumen catheter.  Is not entirely clear the cause of the patient's altered mental status.  He has significant leukocytosis significant lactate but a fairly normal ABG pH.  Patient's urinalysis and chest x-ray are clear.  Patient CSF appeared clear, results are pending.  Patient has a triple-lumen catheter map currently in the 70s we will hold off on pressors for now.  Patient has a slight troponin elevation but could be due to Acute renal insufficiency versus baseline.  Patient will be admitted to the ICU for further treatment.  INTUBATION Performed by: Harvest Dark  Required items: required blood products, implants, devices, and special equipment available Patient identity confirmed: provided demographic data and hospital-assigned identification number Time out: Immediately prior to procedure a "time out" was called to verify the correct patient, procedure, equipment, support staff and site/side marked as required.  Indications: Airway protection  Intubation method: 4.0 Glidescope Laryngoscopy   Preoxygenation: 100% BVM  Sedatives: 50 mg etomidate Paralytic: 100 mg rocuronium   Tube Size: 8.0 cuffed  Post-procedure assessment: chest rise and ETCO2 monitor Breath sounds: equal and absent over the epigastrium Tube secured with: ETT holder Chest x-ray interpreted by radiologist and me.  Chest x-ray findings: endotracheal tube in appropriate position  Patient tolerated the procedure well  with no immediate complications.  CENTRAL LINE Performed by: Harvest Dark Consent: The procedure was performed in an emergent situation. Required items: required blood products, implants, devices, and special equipment available Patient identity confirmed: arm band and provided demographic data Time out: Immediately prior to procedure a "time out" was called to verify the correct patient, procedure, equipment, support staff and site/side marked as required. Indications: vascular access Anesthesia: local infiltration Local anesthetic: lidocaine 1% with epinephrine Anesthetic total: 3 ml Patient sedated: no Preparation: skin prepped with 2% chlorhexidine Skin prep agent dried: skin prep agent completely dried prior to procedure Sterile barriers: all five maximum sterile barriers used - cap, mask,  sterile gown, sterile gloves, and large sterile sheet Hand hygiene: hand hygiene performed prior to central venous catheter insertion  Location details: Right internal jugular  Catheter type: triple lumen Catheter size: 8 Fr Pre-procedure: landmarks identified Ultrasound guidance: Yes Successful placement: yes Post-procedure: line sutured and dressing applied Assessment: blood return through all parts, free fluid flow Patient tolerance: Patient tolerated the procedure well with no immediate complications.  LUMBAR PUNCTURE  Date/Time: 06/02/2018 at 8:49 PM Performed by: Harvest Dark  Consent: Emergent Test results: test results available and properly labeled Site marked: the operative site was marked Imaging studies: imaging studies available  Required items: required blood products, implants, devices, and special equipment available  Patient identity confirmed: verbally with patient and arm band  Time out: Immediately prior to procedure a "time out" was called to verify the correct patient, procedure, equipment, support staff and site/side marked as required.  Indications:  Rule out encephalitis/meningitis Anesthesia: local infiltration Local anesthetic: lidocaine 1% without epinephrine Anesthetic total: 4 ml Patient sedated: Propofol/Versed  (intubated as well) Analgesia: Local anesthetic Preparation: Patient was prepped and draped in the usual sterile fashion. Lumbar space: L3-L4 interspace Patient's position: left lateral decubitus Needle gauge: 20 Needle length: 3.5 in Number of attempts: 1 Opening pressure: 13 cm H2O Fluid appearance: Clear Tubes of fluid: 4 Total volume: 8 ml Post-procedure: site cleaned and adhesive bandage applied    CRITICAL CARE Performed by: Harvest Dark   Total critical care time: 60 minutes  Critical care time was exclusive of separately billable procedures and treating other patients.  Critical care was necessary to treat or prevent imminent or life-threatening deterioration.  Critical care was time spent personally by me on the following activities: development of treatment plan with patient and/or surrogate as well as nursing, discussions with consultants, evaluation of patient's response to treatment, examination of patient, obtaining history from patient or surrogate, ordering and performing treatments and interventions, ordering and review of laboratory studies, ordering and review of radiographic studies, pulse oximetry and re-evaluation of patient's condition.     ____________________________________________   FINAL CLINICAL IMPRESSION(S) / ED DIAGNOSES  Altered mental status Sepsis   Harvest Dark, MD 06/02/18 Si Raider    Harvest Dark, MD 06/02/18 [id number]    Harvest Dark, MD 06/02/18 [id number]

## 2018-06-02 NOTE — ED Triage Notes (Signed)
Pt presents via [facility] from home. Wife found pt in yard with a Fish farm manager on him.in transit pt was [facility] at a rate of 235 EMS shocked at [place] dropped rate to 127, no change in combative state.  Pt was very combative on arrival. Unable to maintain airway intubation occurred.

## 2018-06-03 ENCOUNTER — Other Ambulatory Visit: Payer: Self-pay

## 2018-06-03 ENCOUNTER — Inpatient Hospital Stay: Payer: Medicare HMO

## 2018-06-03 ENCOUNTER — Inpatient Hospital Stay (HOSPITAL_COMMUNITY)
Admit: 2018-06-03 | Discharge: 2018-06-03 | Disposition: A | Discharge status: Home / Self Care | Payer: Medicare HMO | Attending: Internal Medicine | Admitting: Internal Medicine

## 2018-06-03 DIAGNOSIS — J988 Other specified respiratory disorders: Secondary | ICD-10-CM

## 2018-06-03 DIAGNOSIS — R4182 Altered mental status, unspecified: Secondary | ICD-10-CM

## 2018-06-03 DIAGNOSIS — R55 Syncope and collapse: Secondary | ICD-10-CM

## 2018-06-03 DIAGNOSIS — I361 Nonrheumatic tricuspid (valve) insufficiency: Secondary | ICD-10-CM

## 2018-06-03 DIAGNOSIS — E860 Dehydration: Secondary | ICD-10-CM

## 2018-06-03 DIAGNOSIS — T670XXA Heatstroke and sunstroke, initial encounter: Secondary | ICD-10-CM

## 2018-06-03 DIAGNOSIS — N179 Acute kidney failure, unspecified: Secondary | ICD-10-CM

## 2018-06-03 DIAGNOSIS — J9600 Acute respiratory failure, unspecified whether with hypoxia or hypercapnia: Secondary | ICD-10-CM

## 2018-06-03 DIAGNOSIS — I214 Non-ST elevation (NSTEMI) myocardial infarction: Principal | ICD-10-CM

## 2018-06-03 LAB — APTT: APTT: 31 s (ref 24–36)

## 2018-06-03 LAB — COMPREHENSIVE METABOLIC PANEL
ALBUMIN: 3 g/dL — AB (ref 3.5–5.0)
ALT: 26 U/L (ref 0–44)
AST: 63 U/L — AB (ref 15–41)
Alkaline Phosphatase: 68 U/L (ref 38–126)
Anion gap: 7 (ref 5–15)
BUN: 28 mg/dL — ABNORMAL HIGH (ref 8–23)
CALCIUM: 8.1 mg/dL — AB (ref 8.9–10.3)
CHLORIDE: 114 mmol/L — AB (ref 98–111)
CO2: 25 mmol/L (ref 22–32)
CREATININE: 1.42 mg/dL — AB (ref 0.61–1.24)
GFR calc non Af Amer: 50 mL/min — ABNORMAL LOW (ref >60)
GFR, EST AFRICAN AMERICAN: 58 mL/min — AB (ref >60)
GLUCOSE: 125 mg/dL — AB (ref 70–99)
Potassium: 3.8 mmol/L (ref 3.5–5.1)
SODIUM: 146 mmol/L — AB (ref 135–145)
Total Bilirubin: 1.2 mg/dL (ref 0.3–1.2)
Total Protein: 5.5 g/dL — ABNORMAL LOW (ref 6.5–8.1)

## 2018-06-03 LAB — HEPARIN LEVEL (UNFRACTIONATED): Heparin Unfractionated: 0.33 IU/mL (ref 0.30–0.70)

## 2018-06-03 LAB — GLUCOSE, CAPILLARY
GLUCOSE-CAPILLARY: 100 mg/dL — AB (ref 70–99)
GLUCOSE-CAPILLARY: 97 mg/dL (ref 70–99)
Glucose-Capillary: 114 mg/dL — ABNORMAL HIGH (ref 70–99)
Glucose-Capillary: 120 mg/dL — ABNORMAL HIGH (ref 70–99)
Glucose-Capillary: 91 mg/dL (ref 70–99)
Glucose-Capillary: 99 mg/dL (ref 70–99)

## 2018-06-03 LAB — PHOSPHORUS: Phosphorus: 3.5 mg/dL (ref 2.5–4.6)

## 2018-06-03 LAB — CBC
HCT: 36.9 % — ABNORMAL LOW (ref 40.0–52.0)
Hemoglobin: 12.5 g/dL — ABNORMAL LOW (ref 13.0–18.0)
MCH: 32 pg (ref 26.0–34.0)
MCHC: 33.9 g/dL (ref 32.0–36.0)
MCV: 94.4 fL (ref 80.0–100.0)
Platelets: 175 10*3/uL (ref 150–440)
RBC: 3.91 MIL/uL — AB (ref 4.40–5.90)
RDW: 13.5 % (ref 11.5–14.5)
WBC: 11.3 10*3/uL — ABNORMAL HIGH (ref 3.8–10.6)

## 2018-06-03 LAB — PROTIME-INR
INR: 1.12
PROTHROMBIN TIME: 14.3 s (ref 11.4–15.2)

## 2018-06-03 LAB — TROPONIN I
TROPONIN I: 2.26 ng/mL — AB (ref <0.03)
TROPONIN I: 2.6 ng/mL — AB (ref <0.03)
Troponin I: 1.79 ng/mL (ref <0.03)
Troponin I: 2.7 ng/mL (ref <0.03)

## 2018-06-03 LAB — MAGNESIUM: Magnesium: 1.9 mg/dL (ref 1.7–2.4)

## 2018-06-03 LAB — ECHOCARDIOGRAM COMPLETE
Height: 74 in
Weight: 3245.17 [oz_av]

## 2018-06-03 LAB — PROCALCITONIN: Procalcitonin: 0.1 ng/mL

## 2018-06-03 LAB — CK: Total CK: 2530 U/L — ABNORMAL HIGH (ref 49–397)

## 2018-06-03 MED ORDER — PROPOFOL 1000 MG/100ML IV EMUL
INTRAVENOUS | Status: AC
  Administered 2018-06-03: 20 ug/kg/min via INTRAVENOUS
  Filled 2018-06-03: qty 100

## 2018-06-03 MED ORDER — HEPARIN (PORCINE) IN NACL 100-0.45 UNIT/ML-% IJ SOLN
1100.0000 [IU]/h | INTRAMUSCULAR | Status: DC
  Administered 2018-06-03: 1100 [IU]/h via INTRAVENOUS
  Filled 2018-06-03 (×2): qty 250

## 2018-06-03 MED ORDER — METOPROLOL TARTRATE 25 MG PO TABS
12.5000 mg | ORAL_TABLET | Freq: Two times a day (BID) | ORAL | Status: DC
Start: 2018-06-03 — End: 2018-06-04
  Administered 2018-06-04: 12.5 mg via ORAL
  Filled 2018-06-03: qty 1

## 2018-06-03 MED ORDER — VITAMIN B-1 100 MG PO TABS
100.0000 mg | ORAL_TABLET | Freq: Every day | ORAL | Status: DC
  Administered 2018-06-03 – 2018-06-08 (×6): 100 mg via ORAL
  Filled 2018-06-03 (×6): qty 1

## 2018-06-03 MED ORDER — DEXMEDETOMIDINE HCL IN NACL 400 MCG/100ML IV SOLN
0.0000 ug/kg/h | INTRAVENOUS | Status: DC
  Administered 2018-06-03: 0.4 ug/kg/h via INTRAVENOUS
  Administered 2018-06-04: 0.7 ug/kg/h via INTRAVENOUS
  Filled 2018-06-03 (×3): qty 100

## 2018-06-03 MED ORDER — IPRATROPIUM-ALBUTEROL 0.5-2.5 (3) MG/3ML IN SOLN: 3.0000 mL | Freq: Four times a day (QID) | RESPIRATORY_TRACT | Status: DC | PRN

## 2018-06-03 MED ORDER — DEXTROSE IN LACTATED RINGERS 5 % IV SOLN
INTRAVENOUS | Status: DC
  Administered 2018-06-03: 17:00:00 via INTRAVENOUS

## 2018-06-03 MED ORDER — MIDAZOLAM HCL 2 MG/2ML IJ SOLN
INTRAMUSCULAR | Status: AC
  Administered 2018-06-03: 4 mg via INTRAVENOUS
  Filled 2018-06-03: qty 4

## 2018-06-03 MED ORDER — BUPROPION HCL ER (SR) 100 MG PO TB12
200.0000 mg | ORAL_TABLET | Freq: Two times a day (BID) | ORAL | Status: DC
  Filled 2018-06-03: qty 2

## 2018-06-03 MED ORDER — SODIUM CHLORIDE 0.9% FLUSH: 10.0000 mL | INTRAVENOUS | Status: DC | PRN

## 2018-06-03 MED ORDER — LEVOTHYROXINE SODIUM 50 MCG PO TABS
50.0000 ug | ORAL_TABLET | Freq: Every day | ORAL | Status: DC
  Administered 2018-06-04: 50 ug via ORAL
  Filled 2018-06-03: qty 1

## 2018-06-03 MED ORDER — ORAL CARE MOUTH RINSE
15.0000 mL | Freq: Two times a day (BID) | OROMUCOSAL | Status: DC
  Administered 2018-06-03: 15 mL via OROMUCOSAL

## 2018-06-03 MED ORDER — ASPIRIN EC 81 MG PO TBEC
81.0000 mg | DELAYED_RELEASE_TABLET | Freq: Every day | ORAL | Status: DC
  Administered 2018-06-03 – 2018-06-07 (×5): 81 mg via ORAL
  Filled 2018-06-03 (×6): qty 1

## 2018-06-03 MED ORDER — SODIUM BICARBONATE 8.4 % IV SOLN
150.0000 meq | Freq: Once | INTRAVENOUS | Status: AC
  Administered 2018-06-03: 150 meq via INTRAVENOUS
  Filled 2018-06-03: qty 150

## 2018-06-03 MED ORDER — ROSUVASTATIN CALCIUM 10 MG PO TABS
5.0000 mg | ORAL_TABLET | Freq: Every day | ORAL | Status: DC
Start: 2018-06-03 — End: 2018-06-05
  Administered 2018-06-03: 5 mg via ORAL
  Filled 2018-06-03: qty 1

## 2018-06-03 MED ORDER — BLISTEX MEDICATED EX OINT
1.0000 "application " | TOPICAL_OINTMENT | CUTANEOUS | Status: DC | PRN
  Administered 2018-06-04 (×2): 1 via TOPICAL
  Filled 2018-06-03: qty 6.3

## 2018-06-03 MED ORDER — MIDAZOLAM HCL 2 MG/2ML IJ SOLN
4.0000 mg | Freq: Once | INTRAMUSCULAR | Status: AC
  Administered 2018-06-03: 4 mg via INTRAVENOUS

## 2018-06-03 MED ORDER — SODIUM CHLORIDE 0.9 % IV SOLN
INTRAVENOUS | Status: DC
  Administered 2018-06-03: 02:00:00 via INTRAVENOUS

## 2018-06-03 MED ORDER — SODIUM CHLORIDE 0.9% FLUSH
10.0000 mL | Freq: Two times a day (BID) | INTRAVENOUS | Status: DC
  Administered 2018-06-03: 20 mL
  Administered 2018-06-03: 30 mL
  Administered 2018-06-04: 10 mL

## 2018-06-03 MED ORDER — FENTANYL CITRATE (PF) 100 MCG/2ML IJ SOLN
25.0000 ug | Freq: Once | INTRAMUSCULAR | Status: AC
  Administered 2018-06-03: 25 ug via INTRAVENOUS
  Filled 2018-06-03: qty 2

## 2018-06-03 NOTE — Consult Note (Signed)
PULMONARY / CRITICAL CARE MEDICINE   Name: Stuart Harvey MRN: [id number] DOB: 10-28-1951    ADMISSION DATE:  06/02/2018   CONSULTATION DATE: 06/02/2018  REFERRING MD: Dr. Doy Hutching  Reason: Sepsis  HISTORY OF PRESENT ILLNESS:   67 year old Caucasian male with a medical history as indicated below who presented to the ED after being found down on his porch by his wife.  Per patient's wife, patient was enjoyed his usual state of health she left the house at 9 AM in the morning.  Patient was working outside and she last spoke with him at about 1 PM.  She subsequently called without any response and returned home at about 3 PM and found him lying on the porch with multiple bruises combative and unresponsive.  EMS was called.  When EMS arrived, patient was in ventricular tachycardia.  He was cardioverted x1 by EMS.  Upon arrival in the ED, patient was tachycardic still tachycardic with heart rate in the 110s.  He was confused, agitated and hence was intubated for airway protection.  His ED work-up showed an elevated lactic level of greater than 6 and a WBC of 20,000.  He was ruled in for sepsis of unknown source.  His chest and x-ray CT head were all negative.  His toxicology was negative.  Is been admitted to the ICU for further management  PAST MEDICAL HISTORY :  He  has a past medical history of AAA (abdominal aortic aneurysm) ([place]), Anemia, Arthritis, Chronic kidney disease, Coronary artery disease, Depression, GERD (gastroesophageal reflux disease), Hernia of abdominal cavity, History of heart attack, Hyperlipidemia, Hypertension, Hypothyroidism, and Myocardial infarction ([place]).  PAST SURGICAL HISTORY: He  has a past surgical history that includes Exploratory laparotomy (1997); Coronary artery bypass graft (2008); Cardiac catheterization (2008, 2011); Abdominal aortic aneurysm repair (08/2007); Abdominal aortic aneurysm repair (11/2012); Total knee arthroplasty (Right, 09/2016); Hernia repair; Joint  replacement; and Knee Arthroplasty (Left, 09/19/2016).  Allergies  Allergen Reactions  . Codeine Nausea Only  . Tetracycline Rash    No current facility-administered medications on file prior to encounter.    Current Outpatient Medications on File Prior to Encounter  Medication Sig  . aspirin 81 MG tablet Take 81 mg by mouth daily.  Marland Kitchen buPROPion (WELLBUTRIN SR) 200 MG 12 hr tablet Take 1 tablet (200 mg total) by mouth 2 (two) times daily.  . Cholecalciferol (D3 SUPER STRENGTH) 2000 UNITS CAPS Take 1 capsule by mouth daily.   . citalopram (CELEXA) 40 MG tablet Take 1 tablet (40 mg total) by mouth daily.  Marland Kitchen levothyroxine (SYNTHROID, LEVOTHROID) 50 MCG tablet Take 1 tablet (50 mcg total) by mouth daily.  Marland Kitchen lisinopril (PRINIVIL,ZESTRIL) 5 MG tablet Take 1 tablet (5 mg total) by mouth daily.  . metoprolol tartrate (LOPRESSOR) 25 MG tablet Take 0.5 tablets (12.5 mg total) by mouth 2 (two) times daily.  Marland Kitchen omeprazole (PRILOSEC) 20 MG capsule Take 1 capsule (20 mg total) by mouth daily.  . tamsulosin (FLOMAX) 0.4 MG CAPS capsule Take 1 capsule (0.4 mg total) by mouth daily.    FAMILY HISTORY:  His family history includes Depression in his maternal grandfather and maternal grandmother; Heart attack in his father, mother, and unknown relative; Heart disease in his mother and unknown relative; Hypertension in his father and mother; Kidney disease in his brother. There is no history of Prostate cancer, Kidney cancer, Stomach cancer, or Colon cancer.  SOCIAL HISTORY: He  reports that he quit smoking about 12 years ago. His smoking use included  cigarettes. He has a 35.00 pack-year smoking history. His smokeless tobacco use includes chew. He reports that he does not drink alcohol or use drugs.  REVIEW OF SYSTEMS:   Unable to obtain  SUBJECTIVE:   VITAL SIGNS: BP 104/65   Pulse 71   Temp 99.1 F (37.3 C)   Resp 20   Ht 6\' 2"  (1.88 m)   Wt 202 lb 13.2 oz (92 kg)   SpO2 94%   BMI 26.04 kg/m    HEMODYNAMICS:    VENTILATOR SETTINGS: Vent Mode: PRVC FiO2 (%):  [40 %-60 %] 40 % Set Rate:  [12 bmp-20 bmp] 20 bmp Vt Set:  [550 mL] 550 mL PEEP:  [5 cmH20] 5 cmH20 Plateau Pressure:  [12 cmH20-13 cmH20] 12 cmH20  INTAKE / OUTPUT: No intake/output data recorded.  PHYSICAL EXAMINATION: General: Sedated Neuro: Opens eyes and withdraws to pain, restless, HEENT: PERRLA, trachea midline, no JVD, +ROM Cardiovascular: AP regular, S1/S2, no MRG, +2 pulses, no edema Lungs:  Bilateral breath sounds, no wheezing Abdomen: Non-distended, normal bowel sounds Musculoskeletal: +rom, mild right ankle swelling Psyche: agitated and confused Skin: warm and dry, multiple bruises, skin tears and lacerations in bilateral upper and lower extremities  LABS:  BMET Recent Labs  Lab 06/02/18 1641 06/02/18 2301  NA 144 143  K 4.4 4.2  CL 111 116*  CO2 16* 20*  BUN 31* 29*  CREATININE 2.00* 1.52*  GLUCOSE 170* 125*    Electrolytes Recent Labs  Lab 06/02/18 1641 06/02/18 2301  CALCIUM 9.9 8.1*  MG  --  1.9  PHOS  --  3.5    CBC Recent Labs  Lab 06/02/18 1641 06/02/18 2301  WBC 20.4* 13.7*  HGB 15.9 13.0  HCT 47.2 39.0*  PLT 237 162    Coag's No results for input(s): APTT, INR in the last 168 hours.  Sepsis Markers Recent Labs  Lab 06/02/18 1641 06/02/18 1919 06/02/18 2219  LATICACIDVEN 6.2* 2.1* 1.2  PROCALCITON  --   --  <0.10    ABG Recent Labs  Lab 06/02/18 1714  PHART 7.32*  PCO2ART 38  PO2ART 126*    Liver Enzymes Recent Labs  Lab 06/02/18 1641 06/02/18 2301  AST 55* 58*  ALT 26 24  ALKPHOS 103 70  BILITOT 1.2 1.1  ALBUMIN 4.4 3.1*    Cardiac Enzymes Recent Labs  Lab 06/02/18 1641  TROPONINI 0.13*    Glucose Recent Labs  Lab 06/02/18 2034 06/02/18 2334  GLUCAP 121* 121*    Imaging Ct Head Wo Contrast  Result Date: 06/02/2018 CLINICAL DATA:  Altered mental status. Multiple bruises and abrasions. EXAM: CT HEAD WITHOUT CONTRAST  CT CERVICAL SPINE WITHOUT CONTRAST TECHNIQUE: Multidetector CT imaging of the head and cervical spine was performed following the standard protocol without intravenous contrast. Multiplanar CT image reconstructions of the cervical spine were also generated. COMPARISON:  None. FINDINGS: CT HEAD FINDINGS Brain: Diffusely enlarged ventricles and subarachnoid spaces. No intracranial hemorrhage, mass lesion or CT evidence of acute infarction. Vascular: No hyperdense vessel or unexpected calcification. Skull: Normal. Negative for fracture or focal lesion. Sinuses/Orbits: Unremarkable. Other: None. CT CERVICAL SPINE FINDINGS Alignment: Mild anterolisthesis at the C2-3 and C3-4 levels. Mild retrolisthesis at the C4-5, C5-6 and C6-7 levels. Mild reversal the normal lordosis in the upper cervical spine. Mild dextroconvex cervical scoliosis. Skull base and vertebrae: No acute fracture. No primary bone lesion or focal pathologic process. Soft tissues and spinal canal: There is some fluid density posterior to the upper portion  of the endotracheal tube in the posterior oropharynx. No visible canal hematoma. Disc levels: Multilevel degenerative changes, including facet degenerative changes. Upper chest: Clear lung apices. Other: Endotracheal and nasogastric tubes in place. Bilateral carotid artery calcifications. IMPRESSION: 1. No skull fracture or intracranial hemorrhage. 2. No cervical spine fracture or traumatic subluxation. 3. Mild diffuse cerebral and cerebellar atrophy. 4. Multilevel cervical spine degenerative changes with associated subluxations. 5. Bilateral carotid artery atheromatous calcifications. Electronically Signed   By: Claudie Revering M.D.   On: 06/02/2018 17:35   Ct Cervical Spine Wo Contrast  Result Date: 06/02/2018 CLINICAL DATA:  Altered mental status. Multiple bruises and abrasions. EXAM: CT HEAD WITHOUT CONTRAST CT CERVICAL SPINE WITHOUT CONTRAST TECHNIQUE: Multidetector CT imaging of the head and  cervical spine was performed following the standard protocol without intravenous contrast. Multiplanar CT image reconstructions of the cervical spine were also generated. COMPARISON:  None. FINDINGS: CT HEAD FINDINGS Brain: Diffusely enlarged ventricles and subarachnoid spaces. No intracranial hemorrhage, mass lesion or CT evidence of acute infarction. Vascular: No hyperdense vessel or unexpected calcification. Skull: Normal. Negative for fracture or focal lesion. Sinuses/Orbits: Unremarkable. Other: None. CT CERVICAL SPINE FINDINGS Alignment: Mild anterolisthesis at the C2-3 and C3-4 levels. Mild retrolisthesis at the C4-5, C5-6 and C6-7 levels. Mild reversal the normal lordosis in the upper cervical spine. Mild dextroconvex cervical scoliosis. Skull base and vertebrae: No acute fracture. No primary bone lesion or focal pathologic process. Soft tissues and spinal canal: There is some fluid density posterior to the upper portion of the endotracheal tube in the posterior oropharynx. No visible canal hematoma. Disc levels: Multilevel degenerative changes, including facet degenerative changes. Upper chest: Clear lung apices. Other: Endotracheal and nasogastric tubes in place. Bilateral carotid artery calcifications. IMPRESSION: 1. No skull fracture or intracranial hemorrhage. 2. No cervical spine fracture or traumatic subluxation. 3. Mild diffuse cerebral and cerebellar atrophy. 4. Multilevel cervical spine degenerative changes with associated subluxations. 5. Bilateral carotid artery atheromatous calcifications. Electronically Signed   By: Claudie Revering M.D.   On: 06/02/2018 17:35   Dg Chest Portable 1 View  Result Date: 06/02/2018 CLINICAL DATA:  Hypoxia EXAM: PORTABLE CHEST 1 VIEW COMPARISON:  July 12, 2010 FINDINGS: Endotracheal tube tip is 3.0 cm above the carina. Nasogastric tube tip and side port are below the diaphragm. No pneumothorax. There is no edema or consolidation. Heart is slightly enlarged with  pulmonary vascularity normal. No adenopathy. There is evidence of prior coronary artery bypass grafting. No evident bone lesions. IMPRESSION: Tube positions as described without pneumothorax. No edema or consolidation. Heart mildly enlarged with evidence of previous coronary artery bypass grafting. Electronically Signed   By: Lowella Grip III M.D.   On: 06/02/2018 17:18    STUDIES:  2 D echo pending  CULTURES: Blood cultures x 2 Urine culture  ANTIBIOTICS: Vancomycin 7/20> Zosyn 7/20>  SIGNIFICANT EVENTS: 7/20>Admitted  LINES/TUBES: PIVs ETT>  DISCUSSION: 67 Y/O male admitted with acute encephalopathic, sepsis of unknown source and acute respiratory failure  ASSESSMENT Acute respiratory failure due to confusion and agitation Sepsis of unknown source Acute encephalopathy SVT s/p cardioversion Elevated troponin with T-Wave inversions on EKG CAD s/p CABG x 3 Rhabdomyolysis Hypotension Hypertension Hyperlipidemia Acute on chronic kidney failure  PLAN Full vent support with current settings ABG and CXR prn Nebulized bronchodilators Abx as indicated above Cardiology consulted; spoke with Dr Josefa Half, and Dr. Candis Musa. Dr Candis Musa recommended trending cardiac enzymes and notify him if troponin peaks>20.   EKG in am Pressors and IV fluids  to maintain MAP>65 IV fluids Bicarb 3 amps IV x1 Trend creatinine Hold all antihypertensives in light of hypotension GI and DVT prophylaxis  FAMILY  - Updates: Family updated at bedside. All questions answered    Magdalene S. [facility] ANP-BC Pulmonary and Critical Care Medicine [facility] Pager [phone] or [phone]  NB: This document was prepared using Dragon voice recognition software and may include unintentional dictation errors.   06/03/2018, 12:39 AM

## 2018-06-03 NOTE — Consult Note (Addendum)
Cardiology Consultation:   Patient ID: Stuart Harvey; [id number]; 26-Feb-1951   Admit date: 06/02/2018 Date of Consult: 06/03/2018  Primary Care Provider: Kathrine Haddock, NP Primary Cardiologist: Fletcher Anon Physician requesting consult:  Dr. Celesta Aver Reason for consult: NSTEMI, found down   Patient Profile:   Stuart Harvey is a 67 y.o. male with a hx of CAD, CABG,   History of Present Illness:   History of severe three-vessel coronary artery disease diagnosed in 2008 large infrarenal abdominal aortic aneurysm.  coronary artery bypass graft surgery in August 2008 at [facility].  open surgical repair of aortic aneurysm in October of 2008.    abnormal stress test in 2011.  Cardiac catheterization showed patent grafts with an ejection fraction of 40%.   stress test was in 2013 evidence of prior inferolateral infarct with some peri-infarct ischemia and normal ejection fraction.   Echocardiogram in October 2016 showed an ejection fraction of 50-55%   prior hernia surgery, right knee replacement, left knee replacement.  Family reports that he was found down on his porch by family and wife Was frothing at the mouth, "seizure", writhing around on the hot cement Timing unclear but they feel he will have been down for 1 hour Numerous bruises and areas of ecchymosis and bleeding all over his body face shoulders toes feet legs EMS called, found to be in VT,cardioverted once Was confused, yelling out in the ambulance In the emergency room confused agitated, sinus tachycardia, Intubated for airway protection Emergency room workup showing elevated lactic acid, levated WBCs, tox screen negative Anticoagulation initially held overnight despite elevated troponin given bleeding from any sores all over his body  Elbows wrapped, knees now wrapped toes wrapped Active bleeding has stopped  Peak troponin 2.7 with CK 2500  Extubated this morning without complication, very confused Repeating the same  questions over and over, "where am I, what happened" Family at the bedside trying to orient him but still confused Trying to get out of bed    Past Medical History:  Diagnosis Date  . AAA (abdominal aortic aneurysm) ([place])    s/p surgical repair in 08/2007 by Dr. Hulda Humphrey  . Anemia   . Arthritis    both knees  . Chronic kidney disease   . Coronary artery disease    CABG at [facility] in 06/2007 for 3 vessel CAD, LIMA to LAD, SVG to RPDA, SVG to OM2 and SVG to D2.  Cath in 2011 showed patent grafts. EF 40%  . Depression   . GERD (gastroesophageal reflux disease)   . Hernia of abdominal cavity   . History of heart attack    "silent heart attack"  . Hyperlipidemia   . Hypertension   . Hypothyroidism   . Myocardial infarction [facility])     Past Surgical History:  Procedure Laterality Date  . ABDOMINAL AORTIC ANEURYSM REPAIR  08/2007  . ABDOMINAL AORTIC ANEURYSM REPAIR  11/2012   [facility]  . CARDIAC CATHETERIZATION  2008, 2011   [facility]   . CORONARY ARTERY BYPASS GRAFT  2008  . EXPLORATORY LAPAROTOMY  1997  . HERNIA REPAIR    . JOINT REPLACEMENT     right knee  . KNEE ARTHROPLASTY Left 09/19/2016   Procedure: COMPUTER ASSISTED TOTAL KNEE ARTHROPLASTY;  Surgeon: Dereck Leep, MD;  Location: [facility] ORS;  Service: Orthopedics;  Laterality: Left;  . TOTAL KNEE ARTHROPLASTY Right 09/2016     Home Medications:  Prior to Admission medications   Medication Sig Start Date End Date Taking? Authorizing Provider  aspirin 81 MG tablet Take 81 mg by mouth daily.   Yes [provider]  buPROPion (WELLBUTRIN SR) 200 MG 12 hr tablet Take 1 tablet (200 mg total) by mouth 2 (two) times daily. 01/12/18  Yes Kathrine Haddock, NP  Cholecalciferol (D3 SUPER STRENGTH) 2000 UNITS CAPS Take 1 capsule by mouth daily.    Yes [provider]  citalopram (CELEXA) 40 MG tablet Take 1 tablet (40 mg total) by mouth daily. 01/12/18  Yes Kathrine Haddock, NP  levothyroxine (SYNTHROID, LEVOTHROID) 50 MCG tablet Take 1  tablet (50 mcg total) by mouth daily. 03/28/18  Yes Kathrine Haddock, NP  lisinopril (PRINIVIL,ZESTRIL) 5 MG tablet Take 1 tablet (5 mg total) by mouth daily. 03/27/18  Yes Kathrine Haddock, NP  metoprolol tartrate (LOPRESSOR) 25 MG tablet Take 0.5 tablets (12.5 mg total) by mouth 2 (two) times daily. 01/12/18  Yes Kathrine Haddock, NP  omeprazole (PRILOSEC) 20 MG capsule Take 1 capsule (20 mg total) by mouth daily. 01/12/18  Yes Kathrine Haddock, NP  tamsulosin (FLOMAX) 0.4 MG CAPS capsule Take 1 capsule (0.4 mg total) by mouth daily. 07/31/17  Yes [name] Frock, MD    Inpatient Medications: Scheduled Meds: . chlorhexidine gluconate (MEDLINE KIT)  15 mL Mouth Rinse BID  . insulin aspart  0-9 Units Subcutaneous Q4H  . levothyroxine  25 mcg Intravenous Daily  . mouth rinse  15 mL Mouth Rinse BID  . silver sulfADIAZINE   Topical BID  . sodium chloride flush  10-40 mL Intracatheter Q12H   Continuous Infusions: . sodium chloride 5 mL/hr at 06/03/18 0700  . famotidine (PEPCID) IV Stopped (06/03/18 1126)  . fentaNYL infusion INTRAVENOUS Stopped (06/03/18 0900)  . heparin 1,100 Units/hr (06/03/18 1342)  . lactated ringers Stopped (06/02/18 2214)  . lactated ringers 150 mL/hr at 06/03/18 1548  . norepinephrine (LEVOPHED) Adult infusion Stopped (06/03/18 1015)  . piperacillin-tazobactam (ZOSYN)  IV Stopped (06/03/18 1526)  . propofol (DIPRIVAN) infusion Stopped (06/03/18 0830)   PRN Meds: acetaminophen **OR** acetaminophen, bisacodyl, fentaNYL, ipratropium-albuterol, lip balm, midazolam, midazolam, ondansetron **OR** ondansetron (ZOFRAN) IV, sennosides, sodium chloride flush  Allergies:    Allergies  Allergen Reactions  . Codeine Nausea Only  . Tetracycline Rash    Social History:   Social History   Socioeconomic History  . Marital status: Married    Spouse name: Not on file  . Number of children: Not on file  . Years of education: Not on file  . Highest education level: Not on file    Occupational History  . Occupation: retired  Scientific laboratory technician  . Financial resource strain: Not on file  . Food insecurity:    Worry: Not on file    Inability: Not on file  . Transportation needs:    Medical: Not on file    Non-medical: Not on file  Tobacco Use  . Smoking status: Former Smoker    Packs/day: 1.00    Years: 35.00    Pack years: 35.00    Types: Cigarettes    Last attempt to quit: 12/16/2005    Years since quitting: 12.4  . Smokeless tobacco: Current User    Types: Chew  . Tobacco comment: pt states he very rarely uses smokeless tobacco   Substance and Sexual Activity  . Alcohol use: No  . Drug use: No  . Sexual activity: Yes    Partners: Female  Lifestyle  . Physical activity:    Days per week: Not on file    Minutes per session: Not  on file  . Stress: Not on file  Relationships  . Social connections:    Talks on phone: Not on file    Gets together: Not on file    Attends religious service: Not on file    Active member of club or organization: Not on file    Attends meetings of clubs or organizations: Not on file    Relationship status: Not on file  . Intimate partner violence:    Fear of current or ex partner: Not on file    Emotionally abused: Not on file    Physically abused: Not on file    Forced sexual activity: Not on file  Other Topics Concern  . Not on file  Social History Narrative  . Not on file    Family History:    Family History  Problem Relation Age of Onset  . Heart disease Mother   . Heart attack Mother   . Hypertension Mother   . Heart attack Father   . Hypertension Father   . Kidney disease Brother   . Depression Maternal Grandmother   . Depression Maternal Grandfather   . Heart disease Unknown   . Heart attack Unknown   . Prostate cancer Neg Hx   . Kidney cancer Neg Hx   . Stomach cancer Neg Hx   . Colon cancer Neg Hx      ROS:  Please see the history of present illness.  Review of Systems  Constitutional: Negative.    Respiratory: Negative.   Cardiovascular: Negative.   Gastrointestinal: Negative.   Musculoskeletal: Negative.   Neurological: Negative.   Psychiatric/Behavioral: Positive for memory loss.  All other systems reviewed and are negative.   Physical Exam/Data:   Vitals:   06/03/18 1015 06/03/18 1200 06/03/18 1300 06/03/18 1400  BP: 122/71 115/76 117/75 126/79  Pulse: 71 79 82 85  Resp: 17 13 11 19   Temp: 99.7 F (37.6 C) 99.7 F (37.6 C) 99.9 F (37.7 C) (!) 100.4 F (38 C)  TempSrc:  Bladder Bladder Bladder  SpO2: 95% 92% 94% 93%  Weight:      Height:        Intake/Output Summary (Last 24 hours) at 06/03/2018 1601 Last data filed at 06/03/2018 1548 Gross per 24 hour  Intake 5448.73 ml  Output 1405 ml  Net 4043.73 ml   Filed Weights   06/02/18 1650 06/02/18 2030 06/03/18 0432  Weight: 200 lb (90.7 kg) 202 lb 13.2 oz (92 kg) 202 lb 13.2 oz (92 kg)   Body mass index is 26.04 kg/m.  General:  Well nourished, well developed, mild distress, trying to climb out of bed Confused, not oriented HEENT: normal Lymph: no adenopathy Neck: no JVD Endocrine:  No thryomegaly Vascular: No carotid bruits; FA pulses 2+ bilaterally without bruits  Cardiac:  normal S1, S2; RRR; no murmur  Lungs:  clear to auscultation bilaterally, no wheezing, rhonchi or rales  Abd: soft, nontender, no hepatomegaly  Ext: no edema Musculoskeletal:  No deformities, BUE and BLE strength normal and equal Skin: warm and dry , numerous regions of ecchymosis face lips neck arms shoulders hands thighs knees shins toes Neuro:  CNs 2-12 intact, no focal abnormalities noted Psych:  Confused, memory loss of the events, mildly agitated  EKG:  The EKG was personally reviewed and demonstrates:  Normal sinus rhythm with ramatic T-wave inversions in anterolateral leads Telemetry:  Telemetry was personally reviewed and demonstrates:  normal sinus rhythm  Relevant CV Studies: Echocardiogram performed today, personally  reviewed by myself - Left ventricle: The cavity size was mildly dilated. Systolic   function was normal. The estimated ejection fraction was in the   range of 50% to 55%. Challenging images though grossly normal   Wall motion; there were grossly no significant regional wall   motion abnormalities. Left ventricular diastolic function   parameters were normal. - Ascending aorta: The ascending aorta was moderately dilated. 4.4   cm - Left atrium: The appendage was mildly to moderately dilated. - Right ventricle: Systolic function was normal. - Pulmonary arteries: PA peak pressure: 44 mm Hg (S).   Laboratory Data:  Chemistry Recent Labs  Lab 06/02/18 1641 06/02/18 2301 06/03/18 0413  NA 144 143 146*  K 4.4 4.2 3.8  CL 111 116* 114*  CO2 16* 20* 25  GLUCOSE 170* 125* 125*  BUN 31* 29* 28*  CREATININE 2.00* 1.52* 1.42*  CALCIUM 9.9 8.1* 8.1*  GFRNONAA 33* 46* 50*  GFRAA 38* 53* 58*  ANIONGAP 17* 7 7    Recent Labs  Lab 06/02/18 1641 06/02/18 2301 06/03/18 0413  PROT 8.0 5.7* 5.5*  ALBUMIN 4.4 3.1* 3.0*  AST 55* 58* 63*  ALT 26 24 26   ALKPHOS 103 70 68  BILITOT 1.2 1.1 1.2   Hematology Recent Labs  Lab 06/02/18 1641 06/02/18 2301 06/03/18 0413  WBC 20.4* 13.7* 11.3*  RBC 4.98 4.09* 3.91*  HGB 15.9 13.0 12.5*  HCT 47.2 39.0* 36.9*  MCV 94.8 95.2 94.4  MCH 31.9 31.8 32.0  MCHC 33.6 33.4 33.9  RDW 13.4 13.6 13.5  PLT 237 162 175   Cardiac Enzymes Recent Labs  Lab 06/02/18 1641 06/02/18 2051 06/02/18 2301 06/03/18 0412 06/03/18 0956  TROPONINI 0.13* 1.79* 2.26* 2.70* 2.60*   No results for input(s): TROPIPOC in the last 168 hours.  BNPNo results for input(s): BNP, PROBNP in the last 168 hours.  DDimer No results for input(s): DDIMER in the last 168 hours.  Radiology/Studies:  Ct Head Wo Contrast  Result Date: 06/02/2018 CLINICAL DATA:  Altered mental status. Multiple bruises and abrasions. EXAM: CT HEAD WITHOUT CONTRAST CT CERVICAL SPINE WITHOUT  CONTRAST TECHNIQUE: Multidetector CT imaging of the head and cervical spine was performed following the standard protocol without intravenous contrast. Multiplanar CT image reconstructions of the cervical spine were also generated. COMPARISON:  None. FINDINGS: CT HEAD FINDINGS Brain: Diffusely enlarged ventricles and subarachnoid spaces. No intracranial hemorrhage, mass lesion or CT evidence of acute infarction. Vascular: No hyperdense vessel or unexpected calcification. Skull: Normal. Negative for fracture or focal lesion. Sinuses/Orbits: Unremarkable. Other: None. CT CERVICAL SPINE FINDINGS Alignment: Mild anterolisthesis at the C2-3 and C3-4 levels. Mild retrolisthesis at the C4-5, C5-6 and C6-7 levels. Mild reversal the normal lordosis in the upper cervical spine. Mild dextroconvex cervical scoliosis. Skull base and vertebrae: No acute fracture. No primary bone lesion or focal pathologic process. Soft tissues and spinal canal: There is some fluid density posterior to the upper portion of the endotracheal tube in the posterior oropharynx. No visible canal hematoma. Disc levels: Multilevel degenerative changes, including facet degenerative changes. Upper chest: Clear lung apices. Other: Endotracheal and nasogastric tubes in place. Bilateral carotid artery calcifications. IMPRESSION: 1. No skull fracture or intracranial hemorrhage. 2. No cervical spine fracture or traumatic subluxation. 3. Mild diffuse cerebral and cerebellar atrophy. 4. Multilevel cervical spine degenerative changes with associated subluxations. 5. Bilateral carotid artery atheromatous calcifications. Electronically Signed   By: Claudie Revering M.D.   On: 06/02/2018 17:35   Ct Cervical Spine  Wo Contrast  Result Date: 06/02/2018 CLINICAL DATA:  Altered mental status. Multiple bruises and abrasions. EXAM: CT HEAD WITHOUT CONTRAST CT CERVICAL SPINE WITHOUT CONTRAST TECHNIQUE: Multidetector CT imaging of the head and cervical spine was performed  following the standard protocol without intravenous contrast. Multiplanar CT image reconstructions of the cervical spine were also generated. COMPARISON:  None. FINDINGS: CT HEAD FINDINGS Brain: Diffusely enlarged ventricles and subarachnoid spaces. No intracranial hemorrhage, mass lesion or CT evidence of acute infarction. Vascular: No hyperdense vessel or unexpected calcification. Skull: Normal. Negative for fracture or focal lesion. Sinuses/Orbits: Unremarkable. Other: None. CT CERVICAL SPINE FINDINGS Alignment: Mild anterolisthesis at the C2-3 and C3-4 levels. Mild retrolisthesis at the C4-5, C5-6 and C6-7 levels. Mild reversal the normal lordosis in the upper cervical spine. Mild dextroconvex cervical scoliosis. Skull base and vertebrae: No acute fracture. No primary bone lesion or focal pathologic process. Soft tissues and spinal canal: There is some fluid density posterior to the upper portion of the endotracheal tube in the posterior oropharynx. No visible canal hematoma. Disc levels: Multilevel degenerative changes, including facet degenerative changes. Upper chest: Clear lung apices. Other: Endotracheal and nasogastric tubes in place. Bilateral carotid artery calcifications. IMPRESSION: 1. No skull fracture or intracranial hemorrhage. 2. No cervical spine fracture or traumatic subluxation. 3. Mild diffuse cerebral and cerebellar atrophy. 4. Multilevel cervical spine degenerative changes with associated subluxations. 5. Bilateral carotid artery atheromatous calcifications. Electronically Signed   By: Claudie Revering M.D.   On: 06/02/2018 17:35   Portable Chest Xray  Result Date: 06/03/2018 CLINICAL DATA:  Respiratory failure. EXAM: PORTABLE CHEST 1 VIEW COMPARISON:  Yesterday. FINDINGS: Endotracheal tube tip 1.9 cm above the carina. Right jugular catheter tip in the proximal superior vena cava. Nasogastric tube extending into the stomach. Stable enlarged cardiac silhouette and post CABG changes. Interval  mild patchy opacity at the left lung base. The right lung remains clear. The interstitial markings remain minimally prominent. Diffuse osteopenia. IMPRESSION: 1. Interval mild patchy atelectasis or pneumonia at the left lung base. 2. Stable minimal chronic interstitial lung disease. 3. Endotracheal tube tip 1.9 cm above the carina. This could be retracted 2 cm. Electronically Signed   By: Claudie Revering M.D.   On: 06/03/2018 08:03   Dg Chest Portable 1 View  Result Date: 06/02/2018 CLINICAL DATA:  Hypoxia EXAM: PORTABLE CHEST 1 VIEW COMPARISON:  July 12, 2010 FINDINGS: Endotracheal tube tip is 3.0 cm above the carina. Nasogastric tube tip and side port are below the diaphragm. No pneumothorax. There is no edema or consolidation. Heart is slightly enlarged with pulmonary vascularity normal. No adenopathy. There is evidence of prior coronary artery bypass grafting. No evident bone lesions. IMPRESSION: Tube positions as described without pneumothorax. No edema or consolidation. Heart mildly enlarged with evidence of previous coronary artery bypass grafting. Electronically Signed   By: Lowella Grip III M.D.   On: 06/02/2018 17:18    Assessment and Plan:   1) non-STEMI Likely secondary to recent events, found having sustained VT rate 235 bpm, required cardioversion 1 in the field, known coronary artery disease history of bypass surgery Very combat if following cardioversion, required intubation, Sinus tachycardia, dehydration, elevated creatinine -Continues to be very confused and poor candidate forstress testing or cardiac catheterization -Would reassess tomorrow Details discussed with family at the bedside -Given numerous ecchymotic wounds and bleeding has subsided and all are bandaged Will try low-dose heparin infusion with no bolus -restart aspirin, statin once tolerating pills ( speech and swallow pending) -prior note  from 2018 shows Crestor 40 mg daily Will start low-dose 5 mggiven his  rhabdomylysis, dose can be increased as he recovers  2) dehydration/collapse Found down Was working on a stool, per the wife, out in the hot sun Presumably developed heatstroke and dehydration, collapsed Creatinine of 2, improving with hydration  3) sustained VT Telemetry strips not in the chart Cardioverted 1 in the ambulance Presumably secondary to above,  Could consider ischemic workup once oriented Aspirin and heparin for now Low-dose beta blockers once placed back on oral medications  4) CAD, CABG Dramatic change on EKG Elevated cardiac enzymes Echocardiogram reviewed previously by myself showing ejection fraction 50-55% Grossly no regional wall motion abnormalities  5) acute renal failure Secondary to dehydration, improving with hydration   on discussion with patient, family at the bedside  Total encounter time more than 110 minutes  Greater than 50% was spent in counseling and coordination of care with the patient   For questions or updates, please contact [place] Please consult [email or web address] for contact info under Cardiology/STEMI.   Signed, Ida Rogue, MD  06/03/2018 4:01 PM

## 2018-06-03 NOTE — Progress Notes (Signed)
Pt covered in cold, wet towels and wash cloths along with the ice packs.

## 2018-06-03 NOTE — Progress Notes (Signed)
[place] for heparin Indication: chest pain/ACS  Allergies  Allergen Reactions  . Codeine Nausea Only  . Tetracycline Rash    Patient Measurements: Height: 6' 2"  (188 cm) Weight: 202 lb 13.2 oz (92 kg) IBW/kg (Calculated) : 82.2  Vital Signs: Temp: 99.7 F (37.6 C) (07/21 1015) Temp Source: Bladder (07/21 0830) BP: 122/71 (07/21 1015) Pulse Rate: 71 (07/21 1015)  Labs: Recent Labs    06/02/18 1641  06/02/18 2301 06/03/18 0412 06/03/18 0413 06/03/18 0956  HGB 15.9  --  13.0  --  12.5*  --   HCT 47.2  --  39.0*  --  36.9*  --   PLT 237  --  162  --  175  --   CREATININE 2.00*  --  1.52*  --  1.42*  --   CKTOTAL 841*  --  2,373*  --  2,530*  --   TROPONINI 0.13*   < > 2.26* 2.70*  --  2.60*   < > = values in this interval not displayed.    Estimated Creatinine Clearance: 59.5 mL/min (A) (by C-G formula based on SCr of 1.42 mg/dL (H)).   Medical History: Past Medical History:  Diagnosis Date  . AAA (abdominal aortic aneurysm) ([place])    s/p surgical repair in 08/2007 by Dr. Hulda Humphrey  . Anemia   . Arthritis    both knees  . Chronic kidney disease   . Coronary artery disease    CABG at [facility] in 06/2007 for 3 vessel CAD, LIMA to LAD, SVG to RPDA, SVG to OM2 and SVG to D2.  Cath in 2011 showed patent grafts. EF 40%  . Depression   . GERD (gastroesophageal reflux disease)   . Hernia of abdominal cavity   . History of heart attack    "silent heart attack"  . Hyperlipidemia   . Hypertension   . Hypothyroidism   . Myocardial infarction [facility])     Medications:  Scheduled:  . chlorhexidine gluconate (MEDLINE KIT)  15 mL Mouth Rinse BID  . insulin aspart  0-9 Units Subcutaneous Q4H  . levothyroxine  25 mcg Intravenous Daily  . mouth rinse  15 mL Mouth Rinse 10 times per day  . silver sulfADIAZINE   Topical BID    Assessment: 67 year-old male with ST elevation MI with troponin max 2.7 and inferior EKG changes. Dr Rockey Situ has  asked [place] to dose heparin with no initial bolus and to target the low end of the therapeutic range. He was on no anticoagulants PTA but was given 55m Lovenox last night at 2200.   Goal of Therapy:  Heparin level 0.3-0.7 units/ml Monitor platelets by anticoagulation protocol: Yes   Plan:  Start heparin infusion at 1100 units/hr Check anti-Xa level in 6 hours and daily while on heparin Continue to monitor H&H and platelets  RDallie Piles PharmD 06/03/2018,12:03 PM

## 2018-06-03 NOTE — Progress Notes (Signed)
List of abrasions on patient  R Lateral Knee abrasion R knee old scar R Knee abrasion RLL ecchymosis R Medial foot abrasion R great toe and R 5th toe abrasion L Shin abrasion L Lateral knee abrasion L Medial foot abrasion L knee abrasion L knee old scar Nose abrasion R arm abrasion R Hand Abrasion L Arm abrasion L Hand abrasion  All wounds cleansed and dressed

## 2018-06-03 NOTE — Progress Notes (Signed)
Levophed initiated for low MAP.   Pt resting comfortably on precedex 0.43mcg/kg/hr.  Pt c/o of feeling cold.  Bladder temp 99.3F.  Removed cold cloths and ice packs.  Changed pt gown.  Will monitor temp closely.

## 2018-06-03 NOTE — Progress Notes (Signed)
[place] for heparin Indication: chest pain/ACS  Allergies  Allergen Reactions  . Codeine Nausea Only  . Tetracycline Rash    Patient Measurements: Height: _0  (188 cm) Weight: 202 lb 13.2 oz (92 kg) IBW/kg (Calculated) : 82.2  Vital Signs: Temp: 101.7 F (38.7 C) (07/21 2000) Temp Source: Bladder (07/21 2000) BP: 123/73 (07/21 2000) Pulse Rate: 104 (07/21 2000)  Labs: Recent Labs    06/02/18 1641  06/02/18 2301 06/03/18 0412 06/03/18 0413 06/03/18 0956 06/03/18 1225 06/03/18 1924  HGB 15.9  --  13.0  --  12.5*  --   --   --   HCT 47.2  --  39.0*  --  36.9*  --   --   --   PLT 237  --  162  --  175  --   --   --   APTT  --   --   --   --   --   --  31  --   LABPROT  --   --   --   --   --   --  14.3  --   INR  --   --   --   --   --   --  1.12  --   HEPARINUNFRC  --   --   --   --   --   --   --  0.33  CREATININE 2.00*  --  1.52*  --  1.42*  --   --   --   CKTOTAL 841*  --  2,373*  --  2,530*  --   --   --   TROPONINI 0.13*   < > 2.26* 2.70*  --  2.60*  --   --    < > = values in this interval not displayed.    Estimated Creatinine Clearance: 59.5 mL/min (A) (by C-G formula based on SCr of 1.42 mg/dL (H)).   Medical History: Past Medical History:  Diagnosis Date  . AAA (abdominal aortic aneurysm) ([place])    s/p surgical repair in 08/2007 by Dr. Hulda Humphrey  . Anemia   . Arthritis    both knees  . Chronic kidney disease   . Coronary artery disease    CABG at [facility] in 06/2007 for 3 vessel CAD, LIMA to LAD, SVG to RPDA, SVG to OM2 and SVG to D2.  Cath in 2011 showed patent grafts. EF 40%  . Depression   . GERD (gastroesophageal reflux disease)   . Hernia of abdominal cavity   . History of heart attack    "silent heart attack"  . Hyperlipidemia   . Hypertension   . Hypothyroidism   . Myocardial infarction (HCC)     Medications:  Scheduled:  . aspirin EC  81 mg Oral Daily  . [START ON 06/04/2018] buPROPion  200 mg  Oral BID  . chlorhexidine gluconate (MEDLINE KIT)  15 mL Mouth Rinse BID  . insulin aspart  0-9 Units Subcutaneous Q4H  . [START ON 06/04/2018] levothyroxine  50 mcg Oral QAC breakfast  . mouth rinse  15 mL Mouth Rinse BID  . metoprolol tartrate  12.5 mg Oral BID  . rosuvastatin  5 mg Oral q1800  . silver sulfADIAZINE   Topical BID  . sodium chloride flush  10-40 mL Intracatheter Q12H  . thiamine  100 mg Oral Daily    Assessment: 67 year-old male with ST elevation MI with troponin max 2.7 and  inferior EKG changes. Dr Rockey Situ has asked [place] to dose heparin with no initial bolus and to target the low end of the therapeutic range. He was on no anticoagulants PTA but was given 35m Lovenox last night at 2200.  1st heparin level is 0.33 U.mL at the very low end of the therapeutic range as requested by Dr GRockey Situ Goal of Therapy:  Heparin level 0.3-0.7 units/ml Monitor platelets by anticoagulation protocol: Yes   Plan:  Will continue heparin infusion at 1100 units/hr Check another anti-Xa level in 6 hours and daily while on heparin Continue to monitor H&H and platelets  RDallie Piles PharmD 06/03/2018,8:07 PM

## 2018-06-03 NOTE — Progress Notes (Signed)
[place] [facility] [place] NAME: Stuart Harvey    MR#:  [id number]  DATE OF BIRTH:  13-Mar-1951  SUBJECTIVE:  Patient intubated but awake plan to extubate today  Family at bedside  REVIEW OF SYSTEMS:    Unable to obtain patient on vent    Tolerating Diet:NPO      DRUG ALLERGIES:   Allergies  Allergen Reactions  . Codeine Nausea Only  . Tetracycline Rash    VITALS:  Blood pressure 122/71, pulse 71, temperature 99.7 F (37.6 C), resp. rate 17, height 6\' 2"  (1.88 m), weight 92 kg (202 lb 13.2 oz), SpO2 95 %.  PHYSICAL EXAMINATION:  Constitutional: Appears well-developed and well-nourished. Awake on vent intubated No distress. HENT: Normocephalic. .  Eyes: Conjunctivaeare normal. no scleral icterus.  Neck: Normal ROM. Neck supple. No JVD. No tracheal deviation. CVS: RRR, S1/S2 +, no murmurs, no gallops, no carotid bruit.  Pulmonary: Effort and breath sounds normal, no stridor, rhonchi, wheezes, rales.  Abdominal: Soft. BS +,  no distension, tenderness, rebound or guarding.  Musculoskeletal: Normal range of motion. No edema and no tenderness.  Neuro: Alert. Moves all extremities on ventilator.   No focal deficits. Skin: Skin is warm and dry. No rash noted.  Multiple bruises noted Psychiatric: Patient is awake but a bit agitated on ventilator   LABORATORY PANEL:   CBC Recent Labs  Lab 06/03/18 0413  WBC 11.3*  HGB 12.5*  HCT 36.9*  PLT 175   ------------------------------------------------------------------------------------------------------------------  Chemistries  Recent Labs  Lab 06/03/18 0413  NA 146*  K 3.8  CL 114*  CO2 25  GLUCOSE 125*  BUN 28*  CREATININE 1.42*  CALCIUM 8.1*  MG 1.9  AST 63*  ALT 26  ALKPHOS 68  BILITOT 1.2   ------------------------------------------------------------------------------------------------------------------  Cardiac Enzymes Recent Labs  Lab 06/02/18 2301  06/03/18 0412 06/03/18 0956  TROPONINI 2.26* 2.70* 2.60*   ------------------------------------------------------------------------------------------------------------------  RADIOLOGY:  Ct Head Wo Contrast  Result Date: 06/02/2018 CLINICAL DATA:  Altered mental status. Multiple bruises and abrasions. EXAM: CT HEAD WITHOUT CONTRAST CT CERVICAL SPINE WITHOUT CONTRAST TECHNIQUE: Multidetector CT imaging of the head and cervical spine was performed following the standard protocol without intravenous contrast. Multiplanar CT image reconstructions of the cervical spine were also generated. COMPARISON:  None. FINDINGS: CT HEAD FINDINGS Brain: Diffusely enlarged ventricles and subarachnoid spaces. No intracranial hemorrhage, mass lesion or CT evidence of acute infarction. Vascular: No hyperdense vessel or unexpected calcification. Skull: Normal. Negative for fracture or focal lesion. Sinuses/Orbits: Unremarkable. Other: None. CT CERVICAL SPINE FINDINGS Alignment: Mild anterolisthesis at the C2-3 and C3-4 levels. Mild retrolisthesis at the C4-5, C5-6 and C6-7 levels. Mild reversal the normal lordosis in the upper cervical spine. Mild dextroconvex cervical scoliosis. Skull base and vertebrae: No acute fracture. No primary bone lesion or focal pathologic process. Soft tissues and spinal canal: There is some fluid density posterior to the upper portion of the endotracheal tube in the posterior oropharynx. No visible canal hematoma. Disc levels: Multilevel degenerative changes, including facet degenerative changes. Upper chest: Clear lung apices. Other: Endotracheal and nasogastric tubes in place. Bilateral carotid artery calcifications. IMPRESSION: 1. No skull fracture or intracranial hemorrhage. 2. No cervical spine fracture or traumatic subluxation. 3. Mild diffuse cerebral and cerebellar atrophy. 4. Multilevel cervical spine degenerative changes with associated subluxations. 5. Bilateral carotid artery  atheromatous calcifications. Electronically Signed   By: Claudie Revering M.D.   On: 06/02/2018 17:35   Ct Cervical Spine Wo  Contrast  Result Date: 06/02/2018 CLINICAL DATA:  Altered mental status. Multiple bruises and abrasions. EXAM: CT HEAD WITHOUT CONTRAST CT CERVICAL SPINE WITHOUT CONTRAST TECHNIQUE: Multidetector CT imaging of the head and cervical spine was performed following the standard protocol without intravenous contrast. Multiplanar CT image reconstructions of the cervical spine were also generated. COMPARISON:  None. FINDINGS: CT HEAD FINDINGS Brain: Diffusely enlarged ventricles and subarachnoid spaces. No intracranial hemorrhage, mass lesion or CT evidence of acute infarction. Vascular: No hyperdense vessel or unexpected calcification. Skull: Normal. Negative for fracture or focal lesion. Sinuses/Orbits: Unremarkable. Other: None. CT CERVICAL SPINE FINDINGS Alignment: Mild anterolisthesis at the C2-3 and C3-4 levels. Mild retrolisthesis at the C4-5, C5-6 and C6-7 levels. Mild reversal the normal lordosis in the upper cervical spine. Mild dextroconvex cervical scoliosis. Skull base and vertebrae: No acute fracture. No primary bone lesion or focal pathologic process. Soft tissues and spinal canal: There is some fluid density posterior to the upper portion of the endotracheal tube in the posterior oropharynx. No visible canal hematoma. Disc levels: Multilevel degenerative changes, including facet degenerative changes. Upper chest: Clear lung apices. Other: Endotracheal and nasogastric tubes in place. Bilateral carotid artery calcifications. IMPRESSION: 1. No skull fracture or intracranial hemorrhage. 2. No cervical spine fracture or traumatic subluxation. 3. Mild diffuse cerebral and cerebellar atrophy. 4. Multilevel cervical spine degenerative changes with associated subluxations. 5. Bilateral carotid artery atheromatous calcifications. Electronically Signed   By: Claudie Revering M.D.   On: 06/02/2018  17:35   Portable Chest Xray  Result Date: 06/03/2018 CLINICAL DATA:  Respiratory failure. EXAM: PORTABLE CHEST 1 VIEW COMPARISON:  Yesterday. FINDINGS: Endotracheal tube tip 1.9 cm above the carina. Right jugular catheter tip in the proximal superior vena cava. Nasogastric tube extending into the stomach. Stable enlarged cardiac silhouette and post CABG changes. Interval mild patchy opacity at the left lung base. The right lung remains clear. The interstitial markings remain minimally prominent. Diffuse osteopenia. IMPRESSION: 1. Interval mild patchy atelectasis or pneumonia at the left lung base. 2. Stable minimal chronic interstitial lung disease. 3. Endotracheal tube tip 1.9 cm above the carina. This could be retracted 2 cm. Electronically Signed   By: Claudie Revering M.D.   On: 06/03/2018 08:03   Dg Chest Portable 1 View  Result Date: 06/02/2018 CLINICAL DATA:  Hypoxia EXAM: PORTABLE CHEST 1 VIEW COMPARISON:  July 12, 2010 FINDINGS: Endotracheal tube tip is 3.0 cm above the carina. Nasogastric tube tip and side port are below the diaphragm. No pneumothorax. There is no edema or consolidation. Heart is slightly enlarged with pulmonary vascularity normal. No adenopathy. There is evidence of prior coronary artery bypass grafting. No evident bone lesions. IMPRESSION: Tube positions as described without pneumothorax. No edema or consolidation. Heart mildly enlarged with evidence of previous coronary artery bypass grafting. Electronically Signed   By: Lowella Grip III M.D.   On: 06/02/2018 17:18     ASSESSMENT AND PLAN:   67 year old male who presented to the emergency room after being found on his porch by his wife.  1.  Acute respiratory failure, hypoxic due to confusion and agitation requiring temporary intubation: Patient was combative and agitated and was intubated for airway protection. Plan to extubate today Sepsis has been ruled out. Plan to discontinue antibiotics after extubation as  procalcitonin level is less than 0.10 and there is no acute signs of infection  2.  ST elevation MI with troponin max 2.7 and inferior EKG changes: Cardiology consultation pending Follow-up on echocardiogram If blood pressure  tolerates would recommend continuing metoprolol and lisinopril. 3.  Acute encephalopathy of unclear etiology requiring intubation for airway protection with negative head CT Monitor after extubation  4.  Hypothyroidism: Synthroid should be started after extubation  5.  Depression: Patient should resume Wellbutrin and citalopram after extubation  6.  BPH: Patient should resume Flomax after extubation Management plans discussed with the patient's family and they are in agreement.  CODE STATUS: Full  TOTAL TIME TAKING CARE OF THIS PATIENT: 29 minutes.     POSSIBLE D/C to 2-4 days, DEPENDING ON CLINICAL CONDITION.   Bricelyn Freestone M.D on 06/03/2018 at 11:14 AM  Between 7am to 6pm - Pager - [phone] After 6pm go to [email or web address] - password [email or web address] [facility]  Office  [phone]  CC: Primary care physician; Kathrine Haddock, NP  Note: This dictation was prepared with Dragon dictation along with smaller phrase technology. Any transcriptional errors that result from this process are unintentional.

## 2018-06-03 NOTE — Progress Notes (Signed)
Patient was extubated to nasal cannula this AM.  He is currently on 2LPM nasal cannula.  He is alert, following commands, but only oriented to self and place, and is forgetful and asks the same questions multiple times several minutes apart.  Temp high, gave PRN Tylenol and tepid bath.  Patient incredibly thirsty, and drank several cups of water after change in diet.

## 2018-06-03 NOTE — Progress Notes (Signed)
Pt is disorientated with amnesia to the events prior to hospitalization.  He lacks short-term memory.  He repeats the same questions - he is not able to recall events that occurred 5 minutes after explanation.  He is anxious about the lack of ability to recall current and recent past events.

## 2018-06-03 NOTE — Progress Notes (Signed)
Pt. Extubated and placed 4lnc,no apparent distress noted at this time.

## 2018-06-03 NOTE — Progress Notes (Addendum)
PULMONARY / CRITICAL CARE MEDICINE   Name: Stuart Harvey MRN: [id number] DOB: 11-17-1950    ADMISSION DATE:  06/02/2018   CONSULTATION DATE: 06/02/2018  REFERRING MD: Dr. Doy Hutching  Reason: Sepsis  HISTORY OF PRESENT ILLNESS:   67 year old Caucasian male with a medical history as indicated below who presented to the ED after being found down on his porch by his wife.  Per patient's wife, patient was enjoyed his usual state of health she left the house at 9 AM in the morning.  Patient was working outside and she last spoke with him at about 1 PM.  She subsequently called without any response and returned home at about 3 PM and found him lying on the porch with multiple bruises combative and unresponsive.  EMS was called.  When EMS arrived, patient was in ventricular tachycardia.  He was cardioverted x1 by EMS.  Upon arrival in the ED, patient was tachycardic still tachycardic with heart rate in the 110s.  He was confused, agitated and hence was intubated for airway protection.  His ED work-up showed an elevated lactic level of greater than 6 and a WBC of 20,000.  He was ruled in for sepsis of unknown source.  His chest and x-ray CT head were all negative.  His toxicology was negative.  Is been admitted to the ICU for further management   REVIEW OF SYSTEMS:   Unable to obtain  SUBJECTIVE:  No acute events. VITAL SIGNS: BP 114/66 (BP Location: Right Arm)   Pulse 67   Temp 99.5 F (37.5 C) (Bladder)   Resp 20   Ht 6\' 2"  (1.88 m)   Wt 202 lb 13.2 oz (92 kg)   SpO2 100%   BMI 26.04 kg/m   HEMODYNAMICS:   No compromise  VENTILATOR SETTINGS: Vent Mode: PSV FiO2 (%):  [30 %-60 %] 30 % Set Rate:  [12 bmp-20 bmp] 20 bmp Vt Set:  [550 mL] 550 mL PEEP:  [5 cmH20] 5 cmH20 Pressure Support:  [10 cmH20] 10 cmH20 Plateau Pressure:  [12 cmH20-16 cmH20] 16 cmH20  INTAKE / OUTPUT: I/O last 3 completed shifts: In: 2945.3 [I.V.:1919.6; IV Piggyback:1025.8] Out: 860 [Urine:860]  PHYSICAL  EXAMINATION: General: Awake and appropriate on sedation holiday Neuro: Follows all commands on sedation holiday, no deficits HEENT: PERRLA, trachea midline, no JVD, +ROM Cardiovascular: AP regular, S1/S2, no MRG, +2 pulses, no edema Lungs:  Bilateral breath sounds, no wheezing, no rhonchi Abdomen: Non-distended, normal bowel sounds Musculoskeletal: +rom, mild right ankle swelling Psyche: agitated and confused Skin: warm and dry, multiple bruises, skin tears and lacerations in bilateral upper and lower extremities  LABS:  BMET Recent Labs  Lab 06/02/18 1641 06/02/18 2301 06/03/18 0413  NA 144 143 146*  K 4.4 4.2 3.8  CL 111 116* 114*  CO2 16* 20* 25  BUN 31* 29* 28*  CREATININE 2.00* 1.52* 1.42*  GLUCOSE 170* 125* 125*    Electrolytes Recent Labs  Lab 06/02/18 1641 06/02/18 2301 06/03/18 0413  CALCIUM 9.9 8.1* 8.1*  MG  --  1.9 1.9  PHOS  --  3.5 3.5    CBC Recent Labs  Lab 06/02/18 1641 06/02/18 2301 06/03/18 0413  WBC 20.4* 13.7* 11.3*  HGB 15.9 13.0 12.5*  HCT 47.2 39.0* 36.9*  PLT 237 162 175    Coag's No results for input(s): APTT, INR in the last 168 hours.  Sepsis Markers Recent Labs  Lab 06/02/18 1641 06/02/18 1919 06/02/18 2219 06/03/18 0413  LATICACIDVEN 6.2* 2.1* 1.2  --  PROCALCITON  --   --  <0.10 0.10    ABG Recent Labs  Lab 06/02/18 1714  PHART 7.32*  PCO2ART 38  PO2ART 126*    Liver Enzymes Recent Labs  Lab 06/02/18 1641 06/02/18 2301 06/03/18 0413  AST 55* 58* 63*  ALT 26 24 26   ALKPHOS 103 70 68  BILITOT 1.2 1.1 1.2  ALBUMIN 4.4 3.1* 3.0*    Cardiac Enzymes Recent Labs  Lab 06/02/18 2051 06/02/18 2301 06/03/18 0412  TROPONINI 1.79* 2.26* 2.70*    Glucose Recent Labs  Lab 06/02/18 2034 06/02/18 2334 06/03/18 0327 06/03/18 0736  GLUCAP 121* 121* 120* 114*    Imaging Ct Head Wo Contrast  Result Date: 06/02/2018 CLINICAL DATA:  Altered mental status. Multiple bruises and abrasions. EXAM: CT HEAD  WITHOUT CONTRAST CT CERVICAL SPINE WITHOUT CONTRAST TECHNIQUE: Multidetector CT imaging of the head and cervical spine was performed following the standard protocol without intravenous contrast. Multiplanar CT image reconstructions of the cervical spine were also generated. COMPARISON:  None. FINDINGS: CT HEAD FINDINGS Brain: Diffusely enlarged ventricles and subarachnoid spaces. No intracranial hemorrhage, mass lesion or CT evidence of acute infarction. Vascular: No hyperdense vessel or unexpected calcification. Skull: Normal. Negative for fracture or focal lesion. Sinuses/Orbits: Unremarkable. Other: None. CT CERVICAL SPINE FINDINGS Alignment: Mild anterolisthesis at the C2-3 and C3-4 levels. Mild retrolisthesis at the C4-5, C5-6 and C6-7 levels. Mild reversal the normal lordosis in the upper cervical spine. Mild dextroconvex cervical scoliosis. Skull base and vertebrae: No acute fracture. No primary bone lesion or focal pathologic process. Soft tissues and spinal canal: There is some fluid density posterior to the upper portion of the endotracheal tube in the posterior oropharynx. No visible canal hematoma. Disc levels: Multilevel degenerative changes, including facet degenerative changes. Upper chest: Clear lung apices. Other: Endotracheal and nasogastric tubes in place. Bilateral carotid artery calcifications. IMPRESSION: 1. No skull fracture or intracranial hemorrhage. 2. No cervical spine fracture or traumatic subluxation. 3. Mild diffuse cerebral and cerebellar atrophy. 4. Multilevel cervical spine degenerative changes with associated subluxations. 5. Bilateral carotid artery atheromatous calcifications. Electronically Signed   By: Claudie Revering M.D.   On: 06/02/2018 17:35   Ct Cervical Spine Wo Contrast  Result Date: 06/02/2018 CLINICAL DATA:  Altered mental status. Multiple bruises and abrasions. EXAM: CT HEAD WITHOUT CONTRAST CT CERVICAL SPINE WITHOUT CONTRAST TECHNIQUE: Multidetector CT imaging of the  head and cervical spine was performed following the standard protocol without intravenous contrast. Multiplanar CT image reconstructions of the cervical spine were also generated. COMPARISON:  None. FINDINGS: CT HEAD FINDINGS Brain: Diffusely enlarged ventricles and subarachnoid spaces. No intracranial hemorrhage, mass lesion or CT evidence of acute infarction. Vascular: No hyperdense vessel or unexpected calcification. Skull: Normal. Negative for fracture or focal lesion. Sinuses/Orbits: Unremarkable. Other: None. CT CERVICAL SPINE FINDINGS Alignment: Mild anterolisthesis at the C2-3 and C3-4 levels. Mild retrolisthesis at the C4-5, C5-6 and C6-7 levels. Mild reversal the normal lordosis in the upper cervical spine. Mild dextroconvex cervical scoliosis. Skull base and vertebrae: No acute fracture. No primary bone lesion or focal pathologic process. Soft tissues and spinal canal: There is some fluid density posterior to the upper portion of the endotracheal tube in the posterior oropharynx. No visible canal hematoma. Disc levels: Multilevel degenerative changes, including facet degenerative changes. Upper chest: Clear lung apices. Other: Endotracheal and nasogastric tubes in place. Bilateral carotid artery calcifications. IMPRESSION: 1. No skull fracture or intracranial hemorrhage. 2. No cervical spine fracture or traumatic subluxation. 3. Mild diffuse  cerebral and cerebellar atrophy. 4. Multilevel cervical spine degenerative changes with associated subluxations. 5. Bilateral carotid artery atheromatous calcifications. Electronically Signed   By: Claudie Revering M.D.   On: 06/02/2018 17:35   Portable Chest Xray  Result Date: 06/03/2018 CLINICAL DATA:  Respiratory failure. EXAM: PORTABLE CHEST 1 VIEW COMPARISON:  Yesterday. FINDINGS: Endotracheal tube tip 1.9 cm above the carina. Right jugular catheter tip in the proximal superior vena cava. Nasogastric tube extending into the stomach. Stable enlarged cardiac  silhouette and post CABG changes. Interval mild patchy opacity at the left lung base. The right lung remains clear. The interstitial markings remain minimally prominent. Diffuse osteopenia. IMPRESSION: 1. Interval mild patchy atelectasis or pneumonia at the left lung base. 2. Stable minimal chronic interstitial lung disease. 3. Endotracheal tube tip 1.9 cm above the carina. This could be retracted 2 cm. Electronically Signed   By: Claudie Revering M.D.   On: 06/03/2018 08:03   Dg Chest Portable 1 View  Result Date: 06/02/2018 CLINICAL DATA:  Hypoxia EXAM: PORTABLE CHEST 1 VIEW COMPARISON:  July 12, 2010 FINDINGS: Endotracheal tube tip is 3.0 cm above the carina. Nasogastric tube tip and side port are below the diaphragm. No pneumothorax. There is no edema or consolidation. Heart is slightly enlarged with pulmonary vascularity normal. No adenopathy. There is evidence of prior coronary artery bypass grafting. No evident bone lesions. IMPRESSION: Tube positions as described without pneumothorax. No edema or consolidation. Heart mildly enlarged with evidence of previous coronary artery bypass grafting. Electronically Signed   By: Lowella Grip III M.D.   On: 06/02/2018 17:18    STUDIES:  2 D echo results pending  CULTURES: Blood cultures x 2 Urine culture MRSA PCR- negative ANTIBIOTICS: Vancomycin 7/20> Zosyn 7/20>  SIGNIFICANT EVENTS: 7/20>Admitted  LINES/TUBES: PIVs ETT>  DISCUSSION: 67 Y/O male admitted with acute encephalopathic, thought to be sepsis of unknown source and acute respiratory failure.  ASSESSMENT 1. Acute respiratory failure due to airway compromise Patient AAOX3 on sedation holiday.   Plan:  Will consider vent wean and extubate  2. Sepsis of unknown source This does not appear to be sepsis.  All sources are negative including LP.  This appears to be heat stroke. Plan: continue hydration.  Will de-escalate ABX  3. Acute Heat stroke Plan: hydration  4. Acute  NSTEMI Plan: follow up troponin for downward trend. Follow up ECHO results, Cardiology consult pending, ASA, beta blocker, Lovenox as tolerated, check lipid profile  5. Acute Metabolic encephalopathy secondary to heat stroke- much improved  6. Acut Kidney Injury with mild rhabdomyolysis Plan: f/u UO,  Trend CK levels.  7. Multiple skin abrasions Plan: local care.  Misc: will get carotid dopplers in AM as CT neck shows bilateral artheromatous disease. FAMILY  - Updates: Wife updated at bedside.  Thank you for allowing me the privileges to care for this gentleman.  I have dedicated a totlat of 40 minutes in critical care time minus all appropriate exclusions.  Addendum: patient successfully extubated.  Cammie Sickle, MD Pulmonary and [place] Pager [phone] or [phone]    06/03/2018, 9:43 AM

## 2018-06-03 NOTE — Progress Notes (Signed)
Ice bags place in bilateral axilla and groin.  Bladder temp 101.

## 2018-06-04 DIAGNOSIS — M6282 Rhabdomyolysis: Secondary | ICD-10-CM

## 2018-06-04 DIAGNOSIS — G9340 Encephalopathy, unspecified: Secondary | ICD-10-CM

## 2018-06-04 DIAGNOSIS — I214 Non-ST elevation (NSTEMI) myocardial infarction: Secondary | ICD-10-CM

## 2018-06-04 LAB — URINE CULTURE: Culture: NO GROWTH

## 2018-06-04 LAB — BLOOD GAS, ARTERIAL
Acid-Base Excess: 1.5 mmol/L (ref 0.0–2.0)
Bicarbonate: 27.7 mmol/L (ref 20.0–28.0)
FIO2: 0.3
O2 Saturation: 94.9 %
PEEP/CPAP: 5 cmH2O
Patient temperature: 37
Pressure support: 10 cmH2O
pCO2 arterial: 49 mmHg — ABNORMAL HIGH (ref 32.0–48.0)
pH, Arterial: 7.36 (ref 7.350–7.450)
pO2, Arterial: 78 mmHg — ABNORMAL LOW (ref 83.0–108.0)

## 2018-06-04 LAB — AMMONIA: AMMONIA: 15 umol/L (ref 9–35)

## 2018-06-04 LAB — CK
CK TOTAL: 3999 U/L — AB (ref 49–397)
Total CK: 3860 U/L — ABNORMAL HIGH (ref 49–397)

## 2018-06-04 LAB — LACTIC ACID, PLASMA: Lactic Acid, Venous: 1.3 mmol/L (ref 0.5–1.9)

## 2018-06-04 LAB — LIPID PANEL
CHOLESTEROL: 180 mg/dL (ref 0–200)
HDL: 42 mg/dL (ref >40)
LDL Cholesterol: 124 mg/dL — ABNORMAL HIGH (ref 0–99)
TRIGLYCERIDES: 69 mg/dL (ref <150)
Total CHOL/HDL Ratio: 4.3 RATIO
VLDL: 14 mg/dL (ref 0–40)

## 2018-06-04 LAB — GLUCOSE, CAPILLARY
Glucose-Capillary: 102 mg/dL — ABNORMAL HIGH (ref 70–99)
Glucose-Capillary: 116 mg/dL — ABNORMAL HIGH (ref 70–99)
Glucose-Capillary: 85 mg/dL (ref 70–99)
Glucose-Capillary: 94 mg/dL (ref 70–99)

## 2018-06-04 LAB — BASIC METABOLIC PANEL
Anion gap: 6 (ref 5–15)
BUN: 21 mg/dL (ref 8–23)
CALCIUM: 8.2 mg/dL — AB (ref 8.9–10.3)
CO2: 26 mmol/L (ref 22–32)
CREATININE: 1.15 mg/dL (ref 0.61–1.24)
Chloride: 107 mmol/L (ref 98–111)
GFR calc Af Amer: >60 mL/min (ref >60)
GFR calc non Af Amer: >60 mL/min (ref >60)
Glucose, Bld: 109 mg/dL — ABNORMAL HIGH (ref 70–99)
Potassium: 3.8 mmol/L (ref 3.5–5.1)
SODIUM: 139 mmol/L (ref 135–145)

## 2018-06-04 LAB — MAGNESIUM: MAGNESIUM: 1.9 mg/dL (ref 1.7–2.4)

## 2018-06-04 LAB — PHOSPHORUS: Phosphorus: 2.5 mg/dL (ref 2.5–4.6)

## 2018-06-04 LAB — PROCALCITONIN

## 2018-06-04 LAB — HIV ANTIBODY (ROUTINE TESTING W REFLEX): HIV Screen 4th Generation wRfx: NONREACTIVE

## 2018-06-04 LAB — TROPONIN I: TROPONIN I: 1.03 ng/mL — AB (ref <0.03)

## 2018-06-04 LAB — HEPARIN LEVEL (UNFRACTIONATED): Heparin Unfractionated: 0.42 [IU]/mL (ref 0.30–0.70)

## 2018-06-04 MED ORDER — BUPROPION HCL ER (SR) 100 MG PO TB12
200.0000 mg | ORAL_TABLET | Freq: Two times a day (BID) | ORAL | Status: DC
  Administered 2018-06-04 – 2018-06-05 (×3): 200 mg via ORAL
  Filled 2018-06-04 (×5): qty 2

## 2018-06-04 MED ORDER — DEXTROSE IN LACTATED RINGERS 5 % IV SOLN: INTRAVENOUS | Status: DC

## 2018-06-04 MED ORDER — CITALOPRAM HYDROBROMIDE 20 MG PO TABS
40.0000 mg | ORAL_TABLET | Freq: Every day | ORAL | Status: DC
  Administered 2018-06-04 – 2018-06-05 (×2): 40 mg via ORAL
  Filled 2018-06-04 (×2): qty 2

## 2018-06-04 MED ORDER — ENOXAPARIN SODIUM 40 MG/0.4ML ~~LOC~~ SOLN
40.0000 mg | SUBCUTANEOUS | Status: DC
  Administered 2018-06-04: 40 mg via SUBCUTANEOUS
  Filled 2018-06-04: qty 0.4

## 2018-06-04 MED ORDER — LACTATED RINGERS IV SOLN
INTRAVENOUS | Status: DC
  Administered 2018-06-04: 16:00:00 via INTRAVENOUS

## 2018-06-04 MED ORDER — LEVOTHYROXINE SODIUM 50 MCG PO TABS
50.0000 ug | ORAL_TABLET | ORAL | Status: DC
  Administered 2018-06-04 – 2018-06-05 (×2): 50 ug via ORAL
  Filled 2018-06-04 (×2): qty 1

## 2018-06-04 MED ORDER — HYDROCODONE-ACETAMINOPHEN 5-325 MG PO TABS
1.0000 | ORAL_TABLET | ORAL | Status: DC | PRN
  Filled 2018-06-04: qty 1

## 2018-06-04 MED ORDER — FENTANYL CITRATE (PF) 100 MCG/2ML IJ SOLN
12.5000 ug | INTRAMUSCULAR | Status: DC | PRN
  Administered 2018-06-04 – 2018-06-05 (×3): 12.5 ug via INTRAVENOUS
  Filled 2018-06-04 (×3): qty 2

## 2018-06-04 MED ORDER — SODIUM CHLORIDE 0.9% FLUSH
3.0000 mL | Freq: Two times a day (BID) | INTRAVENOUS | Status: DC
  Administered 2018-06-04 – 2018-06-05 (×3): 3 mL via INTRAVENOUS
  Administered 2018-06-06: 6 mL via INTRAVENOUS
  Administered 2018-06-06 – 2018-06-07 (×3): 3 mL via INTRAVENOUS

## 2018-06-04 MED ORDER — TAMSULOSIN HCL 0.4 MG PO CAPS
0.4000 mg | ORAL_CAPSULE | Freq: Every day | ORAL | Status: DC
  Administered 2018-06-04 – 2018-06-08 (×5): 0.4 mg via ORAL
  Filled 2018-06-04 (×5): qty 1

## 2018-06-04 NOTE — Progress Notes (Signed)
[place] for heparin Indication: chest pain/ACS  Allergies  Allergen Reactions  . Codeine Nausea Only  . Tetracycline Rash    Patient Measurements: Height: '6\' 2"'$  (188 cm) Weight: 202 lb 13.2 oz (92 kg) IBW/kg (Calculated) : 82.2  Vital Signs: Temp: 98.1 F (36.7 C) (07/22 0200) Temp Source: Bladder (07/22 0200) BP: 112/66 (07/22 0200) Pulse Rate: 50 (07/22 0200)  Labs: Recent Labs    06/02/18 1641  06/02/18 2301 06/03/18 0412 06/03/18 0413 06/03/18 0956 06/03/18 1225 06/03/18 1924 06/04/18 0128  HGB 15.9  --  13.0  --  12.5*  --   --   --   --   HCT 47.2  --  39.0*  --  36.9*  --   --   --   --   PLT 237  --  162  --  175  --   --   --   --   APTT  --   --   --   --   --   --  31  --   --   LABPROT  --   --   --   --   --   --  14.3  --   --   INR  --   --   --   --   --   --  1.12  --   --   HEPARINUNFRC  --   --   --   --   --   --   --  0.33 0.42  CREATININE 2.00*  --  1.52*  --  1.42*  --   --   --  1.15  CKTOTAL 841*  --  2,373*  --  2,530*  --   --   --  3,999*  TROPONINI 0.13*   < > 2.26* 2.70*  --  2.60*  --   --   --    < > = values in this interval not displayed.    Estimated Creatinine Clearance: 73.5 mL/min (by C-G formula based on SCr of 1.15 mg/dL).   Medical History: Past Medical History:  Diagnosis Date  . AAA (abdominal aortic aneurysm) ([place])    s/p surgical repair in 08/2007 by Dr. Hulda Humphrey  . Anemia   . Arthritis    both knees  . Chronic kidney disease   . Coronary artery disease    CABG at [facility] in 06/2007 for 3 vessel CAD, LIMA to LAD, SVG to RPDA, SVG to OM2 and SVG to D2.  Cath in 2011 showed patent grafts. EF 40%  . Depression   . GERD (gastroesophageal reflux disease)   . Hernia of abdominal cavity   . History of heart attack    "silent heart attack"  . Hyperlipidemia   . Hypertension   . Hypothyroidism   . Myocardial infarction (HCC)     Medications:  Scheduled:  . aspirin EC  81 mg  Oral Daily  . buPROPion  200 mg Oral BID  . chlorhexidine gluconate (MEDLINE KIT)  15 mL Mouth Rinse BID  . insulin aspart  0-9 Units Subcutaneous Q4H  . levothyroxine  50 mcg Oral QAC breakfast  . mouth rinse  15 mL Mouth Rinse BID  . metoprolol tartrate  12.5 mg Oral BID  . rosuvastatin  5 mg Oral q1800  . silver sulfADIAZINE   Topical BID  . sodium chloride flush  10-40 mL Intracatheter Q12H  . thiamine  100 mg  Oral Daily    Assessment: 67 year-old male with ST elevation MI with troponin max 2.7 and inferior EKG changes. Dr Rockey Situ has asked [place] to dose heparin with no initial bolus and to target the low end of the therapeutic range. He was on no anticoagulants PTA but was given 87m Lovenox last night at 2200.  1st heparin level is 0.33 U.mL at the very low end of the therapeutic range as requested by Dr GRockey Situ 7/22 02:00 Heparin level of 0.42 Goal of Therapy:  Heparin level 0.3-0.7 units/ml Monitor platelets by anticoagulation protocol: Yes   Plan:  Will continue heparin infusion at 1100 units/hr Check another anti-Xa level with AM labs. Continue to monitor H&H and platelets  LPaulina Fusi PharmD, BCPS 06/04/2018 2:59 AM

## 2018-06-04 NOTE — Progress Notes (Signed)
Progress Note  Patient Name: Stuart Harvey Date of Encounter: 06/04/2018  Primary Cardiologist: Fletcher Anon  Subjective   The patient recognize me when I entered the room but he continues to be mildly agitated and seems to be frustrated about not remembering the details of what happened.  Currently with no chest pain or shortness of breath.  Inpatient Medications    Scheduled Meds: . aspirin EC  81 mg Oral Daily  . buPROPion  200 mg Oral BID  . chlorhexidine gluconate (MEDLINE KIT)  15 mL Mouth Rinse BID  . citalopram  40 mg Oral Daily  . enoxaparin (LOVENOX) injection  40 mg Subcutaneous Q24H  . levothyroxine  50 mcg Oral BH-q7a  . rosuvastatin  5 mg Oral q1800  . silver sulfADIAZINE   Topical BID  . sodium chloride flush  10-40 mL Intracatheter Q12H  . tamsulosin  0.4 mg Oral Daily  . thiamine  100 mg Oral Daily   Continuous Infusions: . sodium chloride 5 mL/hr at 06/04/18 0600  . dextrose 5% lactated ringers 50 mL/hr at 06/04/18 0910  . norepinephrine (LEVOPHED) Adult infusion Stopped (06/04/18 0913)   PRN Meds: acetaminophen **OR** [DISCONTINUED] acetaminophen, bisacodyl, ipratropium-albuterol, lip balm, [DISCONTINUED] ondansetron **OR** ondansetron (ZOFRAN) IV, sennosides, sodium chloride flush   Vital Signs    Vitals:   06/04/18 0700 06/04/18 0800 06/04/18 0900 06/04/18 1000  BP: 106/80 99/72 100/68 106/70  Pulse: (!) 101 (!) 26 (!) 49 (!) 49  Resp: 15 19 17 20   Temp: 98.1 F (36.7 C) 98.1 F (36.7 C) (!) 97.3 F (36.3 C)   TempSrc:      SpO2:  92% 100% 100%  Weight:      Height:        Intake/Output Summary (Last 24 hours) at 06/04/2018 1407 Last data filed at 06/04/2018 1030 Gross per 24 hour  Intake 3825.32 ml  Output 1070 ml  Net 2755.32 ml   Filed Weights   06/02/18 2030 06/03/18 0432 06/04/18 0530  Weight: 202 lb 13.2 oz (92 kg) 202 lb 13.2 oz (92 kg) 210 lb 1.6 oz (95.3 kg)    Telemetry    Normal sinus rhythm.- Personally Reviewed  ECG      Sinus bradycardia with deeply inverted T wave in the anterior leads suggestive of anterior ischemia with significantly prolonged QT interval.- Personally Reviewed  Physical Exam   GEN: No acute distress.   Neck: No JVD Cardiac: RRR, no murmurs, rubs, or gallops.  Respiratory: Clear to auscultation bilaterally. GI: Soft, nontender, non-distended  MS: No edema; No deformity. Neuro:  Nonfocal  Psych: Normal affect   Labs    Chemistry Recent Labs  Lab 06/02/18 1641 06/02/18 2301 06/03/18 0413 06/04/18 0128  NA 144 143 146* 139  K 4.4 4.2 3.8 3.8  CL 111 116* 114* 107  CO2 16* 20* 25 26  GLUCOSE 170* 125* 125* 109*  BUN 31* 29* 28* 21  CREATININE 2.00* 1.52* 1.42* 1.15  CALCIUM 9.9 8.1* 8.1* 8.2*  PROT 8.0 5.7* 5.5*  --   ALBUMIN 4.4 3.1* 3.0*  --   AST 55* 58* 63*  --   ALT 26 24 26   --   ALKPHOS 103 70 68  --   BILITOT 1.2 1.1 1.2  --   GFRNONAA 33* 46* 50* >60  GFRAA 38* 53* 58* >60  ANIONGAP 17* 7 7 6      Hematology Recent Labs  Lab 06/02/18 1641 06/02/18 2301 06/03/18 0413  WBC 20.4* 13.7*  11.3*  RBC 4.98 4.09* 3.91*  HGB 15.9 13.0 12.5*  HCT 47.2 39.0* 36.9*  MCV 94.8 95.2 94.4  MCH 31.9 31.8 32.0  MCHC 33.6 33.4 33.9  RDW 13.4 13.6 13.5  PLT 237 162 175    Cardiac Enzymes Recent Labs  Lab 06/02/18 2301 06/03/18 0412 06/03/18 0956 06/04/18 0128  TROPONINI 2.26* 2.70* 2.60* 1.03*   No results for input(s): TROPIPOC in the last 168 hours.   BNPNo results for input(s): BNP, PROBNP in the last 168 hours.   DDimer No results for input(s): DDIMER in the last 168 hours.   Radiology    Ct Head Wo Contrast  Result Date: 06/02/2018 CLINICAL DATA:  Altered mental status. Multiple bruises and abrasions. EXAM: CT HEAD WITHOUT CONTRAST CT CERVICAL SPINE WITHOUT CONTRAST TECHNIQUE: Multidetector CT imaging of the head and cervical spine was performed following the standard protocol without intravenous contrast. Multiplanar CT image reconstructions of  the cervical spine were also generated. COMPARISON:  None. FINDINGS: CT HEAD FINDINGS Brain: Diffusely enlarged ventricles and subarachnoid spaces. No intracranial hemorrhage, mass lesion or CT evidence of acute infarction. Vascular: No hyperdense vessel or unexpected calcification. Skull: Normal. Negative for fracture or focal lesion. Sinuses/Orbits: Unremarkable. Other: None. CT CERVICAL SPINE FINDINGS Alignment: Mild anterolisthesis at the C2-3 and C3-4 levels. Mild retrolisthesis at the C4-5, C5-6 and C6-7 levels. Mild reversal the normal lordosis in the upper cervical spine. Mild dextroconvex cervical scoliosis. Skull base and vertebrae: No acute fracture. No primary bone lesion or focal pathologic process. Soft tissues and spinal canal: There is some fluid density posterior to the upper portion of the endotracheal tube in the posterior oropharynx. No visible canal hematoma. Disc levels: Multilevel degenerative changes, including facet degenerative changes. Upper chest: Clear lung apices. Other: Endotracheal and nasogastric tubes in place. Bilateral carotid artery calcifications. IMPRESSION: 1. No skull fracture or intracranial hemorrhage. 2. No cervical spine fracture or traumatic subluxation. 3. Mild diffuse cerebral and cerebellar atrophy. 4. Multilevel cervical spine degenerative changes with associated subluxations. 5. Bilateral carotid artery atheromatous calcifications. Electronically Signed   By: Claudie Revering M.D.   On: 06/02/2018 17:35   Ct Cervical Spine Wo Contrast  Result Date: 06/02/2018 CLINICAL DATA:  Altered mental status. Multiple bruises and abrasions. EXAM: CT HEAD WITHOUT CONTRAST CT CERVICAL SPINE WITHOUT CONTRAST TECHNIQUE: Multidetector CT imaging of the head and cervical spine was performed following the standard protocol without intravenous contrast. Multiplanar CT image reconstructions of the cervical spine were also generated. COMPARISON:  None. FINDINGS: CT HEAD FINDINGS Brain:  Diffusely enlarged ventricles and subarachnoid spaces. No intracranial hemorrhage, mass lesion or CT evidence of acute infarction. Vascular: No hyperdense vessel or unexpected calcification. Skull: Normal. Negative for fracture or focal lesion. Sinuses/Orbits: Unremarkable. Other: None. CT CERVICAL SPINE FINDINGS Alignment: Mild anterolisthesis at the C2-3 and C3-4 levels. Mild retrolisthesis at the C4-5, C5-6 and C6-7 levels. Mild reversal the normal lordosis in the upper cervical spine. Mild dextroconvex cervical scoliosis. Skull base and vertebrae: No acute fracture. No primary bone lesion or focal pathologic process. Soft tissues and spinal canal: There is some fluid density posterior to the upper portion of the endotracheal tube in the posterior oropharynx. No visible canal hematoma. Disc levels: Multilevel degenerative changes, including facet degenerative changes. Upper chest: Clear lung apices. Other: Endotracheal and nasogastric tubes in place. Bilateral carotid artery calcifications. IMPRESSION: 1. No skull fracture or intracranial hemorrhage. 2. No cervical spine fracture or traumatic subluxation. 3. Mild diffuse cerebral and cerebellar atrophy. 4. Multilevel  cervical spine degenerative changes with associated subluxations. 5. Bilateral carotid artery atheromatous calcifications. Electronically Signed   By: Claudie Revering M.D.   On: 06/02/2018 17:35   Portable Chest Xray  Result Date: 06/03/2018 CLINICAL DATA:  Respiratory failure. EXAM: PORTABLE CHEST 1 VIEW COMPARISON:  Yesterday. FINDINGS: Endotracheal tube tip 1.9 cm above the carina. Right jugular catheter tip in the proximal superior vena cava. Nasogastric tube extending into the stomach. Stable enlarged cardiac silhouette and post CABG changes. Interval mild patchy opacity at the left lung base. The right lung remains clear. The interstitial markings remain minimally prominent. Diffuse osteopenia. IMPRESSION: 1. Interval mild patchy atelectasis  or pneumonia at the left lung base. 2. Stable minimal chronic interstitial lung disease. 3. Endotracheal tube tip 1.9 cm above the carina. This could be retracted 2 cm. Electronically Signed   By: Claudie Revering M.D.   On: 06/03/2018 08:03   Dg Chest Portable 1 View  Result Date: 06/02/2018 CLINICAL DATA:  Hypoxia EXAM: PORTABLE CHEST 1 VIEW COMPARISON:  July 12, 2010 FINDINGS: Endotracheal tube tip is 3.0 cm above the carina. Nasogastric tube tip and side port are below the diaphragm. No pneumothorax. There is no edema or consolidation. Heart is slightly enlarged with pulmonary vascularity normal. No adenopathy. There is evidence of prior coronary artery bypass grafting. No evident bone lesions. IMPRESSION: Tube positions as described without pneumothorax. No edema or consolidation. Heart mildly enlarged with evidence of previous coronary artery bypass grafting. Electronically Signed   By: Lowella Grip III M.D.   On: 06/02/2018 17:18    Cardiac Studies   Echo on 06/03/2018: - Left ventricle: The cavity size was mildly dilated. Systolic   function was normal. The estimated ejection fraction was in the   range of 50% to 55%. Challenging images though grossly normal   Wall motion; there were grossly no significant regional wall   motion abnormalities. Left ventricular diastolic function   parameters were normal. - Ascending aorta: The ascending aorta was moderately dilated. 4.4   cm - Left atrium: The appendage was mildly to moderately dilated. - Right ventricle: Systolic function was normal. - Pulmonary arteries: PA peak pressure: 44 mm Hg (S).  Patient Profile     67 y.o. male with known history of coronary artery disease status post CABG in 2008, abdominal aortic aneurysm status post open surgical repair in October 2008, hypertension hyperlipidemia who was brought by EMS over the weekend after possible out of hospital cardiac arrest.  He was out in the sun and was found by his wife with  multiple injuries suggestive of possible seizure activity.  According to EMS, he was in ventricular tachycardia that required one shock.  Assessment & Plan    1.  Possible heat stroke: Based on the description, it is possible that the patient had a heat stroke with seizure-like activities followed by ventricular arrhythmia.  I do not think that ventricular tachycardia was the primary event. Continue supportive care and consider EEG.  2.  Non-ST elevation myocardial infarction: Most likely this is supply demand ischemia given the level of troponin elevation.  However, the patient has known history of coronary artery disease with 67 year old grafts.  Also his EKG is highly abnormal with deeply inverted T waves in the anterior leads with prolonged QT interval. The patient will need cardiac catheterization before hospital discharge and this was discussed with him and his wife.  Both are agreeable.  However, I recommend that we wait 1 or 2 days until his  mental status improves further. I will keep him n.p.o. after midnight and will reevaluate in a.m. to see if cath is possible tomorrow. Heparin can be changed to prophylactic dose.  3.?  Ventricular tachycardia: Could be due to #1.  Currently with no recurrent arrhythmia.  Rule out ischemic event.  4.  Acute renal failure with rhabdomyolysis: Gradually improving with hydration.  I discussed with Dr. Alva Garnet.     For questions or updates, please contact [place] Please consult [email or web address] for contact info under Cardiology/STEMI.      Signed, [name], MD  06/04/2018, 2:07 PM

## 2018-06-04 NOTE — Progress Notes (Signed)
Notified lab and ordered Ma, Phos, and troponin are added onto the labs drawn this am at 0130.  Ammonia, lactic acid will be drawn this am.

## 2018-06-04 NOTE — Progress Notes (Signed)
Poorly oriented No distress No new complaints When initially seen by me, was on dexmedetomidine and norepinephrine infusions.  These have both now been discontinued  Vitals:   06/04/18 0700 06/04/18 0800 06/04/18 0900 06/04/18 1000  BP: 106/80 99/72 100/68 106/70  Pulse: (!) 101 (!) 26 (!) 49 (!) 49  Resp: 15 19 17 20   Temp: 98.1 F (36.7 C) 98.1 F (36.7 C) (!) 97.3 F (36.3 C)   TempSrc:      SpO2:  92% 100% 100%  Weight:      Height:       Gen: NAD HEENT: NCAT, sclera white Neck: No JVD Lungs: breath sounds slightly coarse, no wheezes or other adventitious sounds Cardiovascular: Bradycardia, regular, no murmurs Abdomen: Soft, nontender, normal BS Ext: without clubbing, cyanosis, edema Neuro: grossly intact Skin: Limited exam, no lesions noted  BMP Latest Ref Rng & Units 06/04/2018 06/03/2018 06/02/2018  Glucose 70 - 99 mg/dL 109(H) 125(H) 125(H)  BUN 8 - 23 mg/dL 21 28(H) 29(H)  Creatinine 0.61 - 1.24 mg/dL 1.15 1.42(H) 1.52(H)  BUN/Creat Ratio 10 - 24 - - -  Sodium 135 - 145 mmol/L 139 146(H) 143  Potassium 3.5 - 5.1 mmol/L 3.8 3.8 4.2  Chloride 98 - 111 mmol/L 107 114(H) 116(H)  CO2 22 - 32 mmol/L 26 25 20(L)  Calcium 8.9 - 10.3 mg/dL 8.2(L) 8.1(L) 8.1(L)    CBC Latest Ref Rng & Units 06/03/2018 06/02/2018 06/02/2018  WBC 3.8 - 10.6 K/uL 11.3(H) 13.7(H) 20.4(H)  Hemoglobin 13.0 - 18.0 g/dL 12.5(L) 13.0 15.9  Hematocrit 40.0 - 52.0 % 36.9(L) 39.0(L) 47.2  Platelets 150 - 440 K/uL 175 162 237   CXR: No new film  IMPRESSION: Acute respiratory failure, resolved Doubt sepsis Acute heatstroke, resolving Non-STEMI Sustained VT, status post cardioversion Rhabdomyolysis  AKI, improving TME  PLAN/REC: Continue supplemental oxygen as needed to maintain SPO2 >90% Discontinue antibiotics IVFs adjusted Cardiology following Monitor BMET intermittently Monitor I/Os Correct electrolytes as indicated Change heparin infusion to enoxaparin (VTE prophylaxis)  Discussed  with Dr. Fletcher Anon If remains off dexmedetomidine and norepinephrine, can transfer to [place] later today  Merton Border, MD PCCM service Mobile [phone] Pager [phone] 06/04/2018 1:23 PM

## 2018-06-04 NOTE — Progress Notes (Signed)
[facility] NAME: Stuart Harvey    MR#:  [id number]  DATE OF BIRTH:  1951-03-21  SUBJECTIVE:  Patient extubated with some confusion this morning  Family at bedside REVIEW OF SYSTEMS:    Review of Systems  Constitutional: Negative for chills, fever and malaise/fatigue.  HENT: Negative.  Negative for ear discharge, ear pain, hearing loss, nosebleeds and sore throat.   Eyes: Negative.  Negative for blurred vision and pain.  Respiratory: Negative.  Negative for cough, hemoptysis, shortness of breath and wheezing.   Cardiovascular: Negative.  Negative for chest pain, palpitations and leg swelling.  Gastrointestinal: Negative.  Negative for abdominal pain, blood in stool, diarrhea, nausea and vomiting.  Genitourinary: Negative.  Negative for dysuria.  Musculoskeletal: Negative.  Negative for back pain.  Skin: Negative.   Neurological: Negative for dizziness, tremors, speech change, focal weakness, seizures and headaches.  Endo/Heme/Allergies: Negative.  Does not bruise/bleed easily.  Psychiatric/Behavioral: Positive for memory loss. Negative for depression, hallucinations and suicidal ideas.       Tolerating Diet:NPO      DRUG ALLERGIES:   Allergies  Allergen Reactions  . Codeine Nausea Only  . Tetracycline Rash    VITALS:  Blood pressure 115/72, pulse (!) 48, temperature 98.1 F (36.7 C), resp. rate (!) 25, height 6\' 2"  (1.88 m), weight 95.3 kg (210 lb 1.6 oz), SpO2 96 %.  PHYSICAL EXAMINATION:  Constitutional: Appears well-developed and well-nourished.  No distress. HENT: Normocephalic. .  Eyes: Conjunctivae are normal. no scleral icterus.  Neck: Normal ROM. Neck supple. No JVD. No tracheal deviation. CVS: RRR, S1/S2 +, no murmurs, no gallops, no carotid bruit.  Pulmonary: Effort and breath sounds normal, no stridor, rhonchi, wheezes, rales.  Abdominal: Soft. BS +,  no distension, tenderness, rebound or guarding.   Musculoskeletal: Normal range of motion. No edema and no tenderness.  Neuro: Alert with some mild confusion. Moves all extremities on ventilator.   No focal deficits. Skin: Skin is warm and dry. No rash noted.  Multiple bruises noted Psychiatric: Patient is awake with some mild confusion  LABORATORY PANEL:   CBC Recent Labs  Lab 06/03/18 0413  WBC 11.3*  HGB 12.5*  HCT 36.9*  PLT 175   ------------------------------------------------------------------------------------------------------------------  Chemistries  Recent Labs  Lab 06/03/18 0413 06/04/18 0128  NA 146* 139  K 3.8 3.8  CL 114* 107  CO2 25 26  GLUCOSE 125* 109*  BUN 28* 21  CREATININE 1.42* 1.15  CALCIUM 8.1* 8.2*  MG 1.9 1.9  AST 63*  --   ALT 26  --   ALKPHOS 68  --   BILITOT 1.2  --    ------------------------------------------------------------------------------------------------------------------  Cardiac Enzymes Recent Labs  Lab 06/03/18 0412 06/03/18 0956 06/04/18 0128  TROPONINI 2.70* 2.60* 1.03*   ------------------------------------------------------------------------------------------------------------------  RADIOLOGY:  Ct Head Wo Contrast  Result Date: 06/02/2018 CLINICAL DATA:  Altered mental status. Multiple bruises and abrasions. EXAM: CT HEAD WITHOUT CONTRAST CT CERVICAL SPINE WITHOUT CONTRAST TECHNIQUE: Multidetector CT imaging of the head and cervical spine was performed following the standard protocol without intravenous contrast. Multiplanar CT image reconstructions of the cervical spine were also generated. COMPARISON:  None. FINDINGS: CT HEAD FINDINGS Brain: Diffusely enlarged ventricles and subarachnoid spaces. No intracranial hemorrhage, mass lesion or CT evidence of acute infarction. Vascular: No hyperdense vessel or unexpected calcification. Skull: Normal. Negative for fracture or focal lesion. Sinuses/Orbits: Unremarkable. Other: None. CT CERVICAL SPINE FINDINGS Alignment:  Mild anterolisthesis at the C2-3  and C3-4 levels. Mild retrolisthesis at the C4-5, C5-6 and C6-7 levels. Mild reversal the normal lordosis in the upper cervical spine. Mild dextroconvex cervical scoliosis. Skull base and vertebrae: No acute fracture. No primary bone lesion or focal pathologic process. Soft tissues and spinal canal: There is some fluid density posterior to the upper portion of the endotracheal tube in the posterior oropharynx. No visible canal hematoma. Disc levels: Multilevel degenerative changes, including facet degenerative changes. Upper chest: Clear lung apices. Other: Endotracheal and nasogastric tubes in place. Bilateral carotid artery calcifications. IMPRESSION: 1. No skull fracture or intracranial hemorrhage. 2. No cervical spine fracture or traumatic subluxation. 3. Mild diffuse cerebral and cerebellar atrophy. 4. Multilevel cervical spine degenerative changes with associated subluxations. 5. Bilateral carotid artery atheromatous calcifications. Electronically Signed   By: Claudie Revering M.D.   On: 06/02/2018 17:35   Ct Cervical Spine Wo Contrast  Result Date: 06/02/2018 CLINICAL DATA:  Altered mental status. Multiple bruises and abrasions. EXAM: CT HEAD WITHOUT CONTRAST CT CERVICAL SPINE WITHOUT CONTRAST TECHNIQUE: Multidetector CT imaging of the head and cervical spine was performed following the standard protocol without intravenous contrast. Multiplanar CT image reconstructions of the cervical spine were also generated. COMPARISON:  None. FINDINGS: CT HEAD FINDINGS Brain: Diffusely enlarged ventricles and subarachnoid spaces. No intracranial hemorrhage, mass lesion or CT evidence of acute infarction. Vascular: No hyperdense vessel or unexpected calcification. Skull: Normal. Negative for fracture or focal lesion. Sinuses/Orbits: Unremarkable. Other: None. CT CERVICAL SPINE FINDINGS Alignment: Mild anterolisthesis at the C2-3 and C3-4 levels. Mild retrolisthesis at the C4-5, C5-6 and  C6-7 levels. Mild reversal the normal lordosis in the upper cervical spine. Mild dextroconvex cervical scoliosis. Skull base and vertebrae: No acute fracture. No primary bone lesion or focal pathologic process. Soft tissues and spinal canal: There is some fluid density posterior to the upper portion of the endotracheal tube in the posterior oropharynx. No visible canal hematoma. Disc levels: Multilevel degenerative changes, including facet degenerative changes. Upper chest: Clear lung apices. Other: Endotracheal and nasogastric tubes in place. Bilateral carotid artery calcifications. IMPRESSION: 1. No skull fracture or intracranial hemorrhage. 2. No cervical spine fracture or traumatic subluxation. 3. Mild diffuse cerebral and cerebellar atrophy. 4. Multilevel cervical spine degenerative changes with associated subluxations. 5. Bilateral carotid artery atheromatous calcifications. Electronically Signed   By: Claudie Revering M.D.   On: 06/02/2018 17:35   Portable Chest Xray  Result Date: 06/03/2018 CLINICAL DATA:  Respiratory failure. EXAM: PORTABLE CHEST 1 VIEW COMPARISON:  Yesterday. FINDINGS: Endotracheal tube tip 1.9 cm above the carina. Right jugular catheter tip in the proximal superior vena cava. Nasogastric tube extending into the stomach. Stable enlarged cardiac silhouette and post CABG changes. Interval mild patchy opacity at the left lung base. The right lung remains clear. The interstitial markings remain minimally prominent. Diffuse osteopenia. IMPRESSION: 1. Interval mild patchy atelectasis or pneumonia at the left lung base. 2. Stable minimal chronic interstitial lung disease. 3. Endotracheal tube tip 1.9 cm above the carina. This could be retracted 2 cm. Electronically Signed   By: Claudie Revering M.D.   On: 06/03/2018 08:03   Dg Chest Portable 1 View  Result Date: 06/02/2018 CLINICAL DATA:  Hypoxia EXAM: PORTABLE CHEST 1 VIEW COMPARISON:  July 12, 2010 FINDINGS: Endotracheal tube tip is 3.0 cm  above the carina. Nasogastric tube tip and side port are below the diaphragm. No pneumothorax. There is no edema or consolidation. Heart is slightly enlarged with pulmonary vascularity normal. No adenopathy. There is evidence of  prior coronary artery bypass grafting. No evident bone lesions. IMPRESSION: Tube positions as described without pneumothorax. No edema or consolidation. Heart mildly enlarged with evidence of previous coronary artery bypass grafting. Electronically Signed   By: Lowella Grip III M.D.   On: 06/02/2018 17:18     ASSESSMENT AND PLAN:   67 year old male who presented to the emergency room after being found on his porch by his wife.  1.  Acute respiratory failure, hypoxic due to confusion and agitation requiring temporary intubation: Patient was combative and agitated and was intubated for airway protection. Patient has been extubated and is doing well.   Sepsis has been ruled out.   2. NSTEMI  with troponin max 2.7 and inferior EKG changes: Cardiology consultation appreciated.  Echocardiogram shows systolic function of 50 to 55% with no major valvular abnormalities or regional wall motion abnormalities. Continue aspirin and low-dose statin with heparin gtt Blood pressure still low to start beta-blocker. Consideration of ischemic work-up as per cardiology  3.  Acute encephalopathy requiring intubation for airway protection with negative head CT: Acute encephalopathy may be due to to heatstroke Effusion seems to improving as per family  4.  Hypothyroidism: Continue Synthroid 5.  Depression: Continue Wellbutrin and citalopram   6.  BPH: Continue Flomax.  7.  Syncope with collapse and mild rhabdo: Continue IV fluids in order CK for a.m. Consider changing to normal saline and increasing rate.   Management plans discussed with the patient's family and they are in agreement.  CODE STATUS: Full  TOTAL TIME TAKING CARE OF THIS PATIENT: 29 minutes.     POSSIBLE  D/C to 2-4 days, DEPENDING ON CLINICAL CONDITION.   Sam Wunschel M.D on 06/04/2018 at 11:40 AM  Between 7am to 6pm - Pager - [phone] After 6pm go to [email or web address] - password [email or web address] [facility]  Office  [phone]  CC: Primary care physician; Kathrine Haddock, NP  Note: This dictation was prepared with Dragon dictation along with smaller phrase technology. Any transcriptional errors that result from this process are unintentional.

## 2018-06-05 ENCOUNTER — Inpatient Hospital Stay: Payer: Medicare HMO

## 2018-06-05 ENCOUNTER — Encounter
Admission: EM | Disposition: A | Discharge status: Home Health | Payer: Self-pay | Source: Home / Self Care | Attending: Internal Medicine

## 2018-06-05 ENCOUNTER — Encounter: Payer: Self-pay | Admitting: Critical Care Medicine

## 2018-06-05 DIAGNOSIS — I472 Ventricular tachycardia, unspecified: Secondary | ICD-10-CM

## 2018-06-05 HISTORY — PX: CORONARY/GRAFT ANGIOGRAPHY: CATH118237

## 2018-06-05 LAB — CBC
HEMATOCRIT: 33.7 % — AB (ref 40.0–52.0)
HEMOGLOBIN: 11.4 g/dL — AB (ref 13.0–18.0)
MCH: 32.3 pg (ref 26.0–34.0)
MCHC: 33.9 g/dL (ref 32.0–36.0)
MCV: 95.2 fL (ref 80.0–100.0)
Platelets: 121 10*3/uL — ABNORMAL LOW (ref 150–440)
RBC: 3.54 MIL/uL — AB (ref 4.40–5.90)
RDW: 13.1 % (ref 11.5–14.5)
WBC: 8.7 10*3/uL (ref 3.8–10.6)

## 2018-06-05 LAB — GLUCOSE, CAPILLARY
GLUCOSE-CAPILLARY: 90 mg/dL (ref 70–99)
GLUCOSE-CAPILLARY: 120 mg/dL — AB (ref 70–99)

## 2018-06-05 LAB — BASIC METABOLIC PANEL
Anion gap: 3 — ABNORMAL LOW (ref 5–15)
BUN: 18 mg/dL (ref 8–23)
CHLORIDE: 106 mmol/L (ref 98–111)
CO2: 31 mmol/L (ref 22–32)
Calcium: 8.4 mg/dL — ABNORMAL LOW (ref 8.9–10.3)
Creatinine, Ser: 1.09 mg/dL (ref 0.61–1.24)
GFR calc non Af Amer: >60 mL/min (ref >60)
Glucose, Bld: 106 mg/dL — ABNORMAL HIGH (ref 70–99)
Potassium: 4 mmol/L (ref 3.5–5.1)
SODIUM: 140 mmol/L (ref 135–145)

## 2018-06-05 LAB — CK: CK TOTAL: 3815 U/L — AB (ref 49–397)

## 2018-06-05 SURGERY — CORONARY/GRAFT ANGIOGRAPHY
Anesthesia: Moderate Sedation

## 2018-06-05 MED ORDER — HALOPERIDOL LACTATE 5 MG/ML IJ SOLN
5.0000 mg | INTRAMUSCULAR | Status: AC
  Administered 2018-06-05: 5 mg via INTRAVENOUS
  Filled 2018-06-05: qty 1

## 2018-06-05 MED ORDER — FENTANYL CITRATE (PF) 100 MCG/2ML IJ SOLN
INTRAMUSCULAR | Status: AC
  Filled 2018-06-05: qty 2

## 2018-06-05 MED ORDER — FENTANYL CITRATE (PF) 100 MCG/2ML IJ SOLN
INTRAMUSCULAR | Status: DC | PRN
  Administered 2018-06-05: 25 ug via INTRAVENOUS

## 2018-06-05 MED ORDER — LIDOCAINE HCL (PF) 1 % IJ SOLN
INTRAMUSCULAR | Status: DC | PRN
  Administered 2018-06-05: 2 mL via INTRADERMAL

## 2018-06-05 MED ORDER — MIDAZOLAM HCL 2 MG/2ML IJ SOLN
INTRAMUSCULAR | Status: AC
  Filled 2018-06-05: qty 2

## 2018-06-05 MED ORDER — FENTANYL CITRATE (PF) 100 MCG/2ML IJ SOLN
12.5000 ug | INTRAMUSCULAR | Status: DC | PRN
  Administered 2018-06-06: 25 ug via INTRAVENOUS
  Filled 2018-06-05 (×2): qty 2

## 2018-06-05 MED ORDER — HEPARIN SODIUM (PORCINE) 1000 UNIT/ML IJ SOLN
INTRAMUSCULAR | Status: DC | PRN
  Administered 2018-06-05: 5000 [IU] via INTRAVENOUS

## 2018-06-05 MED ORDER — IOPAMIDOL (ISOVUE-300) INJECTION 61%
INTRAVENOUS | Status: DC | PRN
  Administered 2018-06-05: 100 mL via INTRA_ARTERIAL

## 2018-06-05 MED ORDER — SODIUM CHLORIDE 0.9% FLUSH
3.0000 mL | Freq: Two times a day (BID) | INTRAVENOUS | Status: DC
  Administered 2018-06-05: 3 mL via INTRAVENOUS

## 2018-06-05 MED ORDER — DEXMEDETOMIDINE HCL IN NACL 400 MCG/100ML IV SOLN
0.0000 ug/kg/h | INTRAVENOUS | Status: DC
  Administered 2018-06-05: 0.5 ug/kg/h via INTRAVENOUS
  Administered 2018-06-05: 1.8 ug/kg/h via INTRAVENOUS
  Administered 2018-06-06: 1.1 ug/kg/h via INTRAVENOUS
  Administered 2018-06-06 (×3): 1.7 ug/kg/h via INTRAVENOUS
  Filled 2018-06-05 (×5): qty 100

## 2018-06-05 MED ORDER — VERAPAMIL HCL 2.5 MG/ML IV SOLN
INTRAVENOUS | Status: AC
  Filled 2018-06-05: qty 2

## 2018-06-05 MED ORDER — ROSUVASTATIN CALCIUM 10 MG PO TABS: 20.0000 mg | ORAL_TABLET | Freq: Every day | ORAL | Status: DC

## 2018-06-05 MED ORDER — SODIUM CHLORIDE 0.9% FLUSH: 3.0000 mL | Freq: Two times a day (BID) | INTRAVENOUS | Status: DC

## 2018-06-05 MED ORDER — ZIPRASIDONE MESYLATE 20 MG IM SOLR
10.0000 mg | Freq: Once | INTRAMUSCULAR | Status: AC
  Administered 2018-06-05: 10 mg via INTRAMUSCULAR
  Filled 2018-06-05 (×2): qty 20

## 2018-06-05 MED ORDER — MIDAZOLAM HCL 2 MG/2ML IJ SOLN
INTRAMUSCULAR | Status: DC | PRN
  Administered 2018-06-05: 1 mg via INTRAVENOUS

## 2018-06-05 MED ORDER — VERAPAMIL HCL 2.5 MG/ML IV SOLN
INTRAVENOUS | Status: DC | PRN
  Administered 2018-06-05: 2.5 mg via INTRA_ARTERIAL

## 2018-06-05 MED ORDER — SODIUM CHLORIDE 0.9% FLUSH: 3.0000 mL | INTRAVENOUS | Status: DC | PRN

## 2018-06-05 MED ORDER — HEPARIN (PORCINE) IN NACL 1000-0.9 UT/500ML-% IV SOLN
INTRAVENOUS | Status: AC
  Filled 2018-06-05: qty 1000

## 2018-06-05 MED ORDER — LORAZEPAM 2 MG/ML IJ SOLN
1.0000 mg | INTRAMUSCULAR | Status: DC | PRN
  Administered 2018-06-05: 1 mg via INTRAVENOUS
  Filled 2018-06-05: qty 1

## 2018-06-05 MED ORDER — HALOPERIDOL LACTATE 5 MG/ML IJ SOLN
INTRAMUSCULAR | Status: AC
  Administered 2018-06-05: 5 mg
  Filled 2018-06-05: qty 1

## 2018-06-05 MED ORDER — NICOTINE 14 MG/24HR TD PT24
14.0000 mg | MEDICATED_PATCH | Freq: Every day | TRANSDERMAL | Status: DC
  Administered 2018-06-05 – 2018-06-08 (×4): 14 mg via TRANSDERMAL
  Filled 2018-06-05 (×4): qty 1

## 2018-06-05 MED ORDER — SODIUM CHLORIDE 0.9 % IV SOLN: INTRAVENOUS | Status: DC

## 2018-06-05 MED ORDER — FENTANYL CITRATE (PF) 100 MCG/2ML IJ SOLN: 25.0000 ug | INTRAMUSCULAR | Status: DC

## 2018-06-05 MED ORDER — METOPROLOL TARTRATE 25 MG PO TABS
25.0000 mg | ORAL_TABLET | Freq: Two times a day (BID) | ORAL | Status: DC
  Administered 2018-06-05: 25 mg via ORAL
  Filled 2018-06-05: qty 1

## 2018-06-05 MED ORDER — ENOXAPARIN SODIUM 40 MG/0.4ML ~~LOC~~ SOLN
40.0000 mg | SUBCUTANEOUS | Status: DC
  Administered 2018-06-06 – 2018-06-07 (×2): 40 mg via SUBCUTANEOUS
  Filled 2018-06-05 (×2): qty 0.4

## 2018-06-05 MED ORDER — METOPROLOL TARTRATE 25 MG PO TABS: 12.5000 mg | ORAL_TABLET | Freq: Two times a day (BID) | ORAL | Status: DC

## 2018-06-05 MED ORDER — SODIUM CHLORIDE 0.9 % IV SOLN: 250.0000 mL | INTRAVENOUS | Status: DC | PRN

## 2018-06-05 MED ORDER — ASPIRIN 81 MG PO CHEW: 81.0000 mg | CHEWABLE_TABLET | ORAL | Status: DC

## 2018-06-05 MED ORDER — FENTANYL CITRATE (PF) 100 MCG/2ML IJ SOLN
50.0000 ug | INTRAMUSCULAR | Status: AC
  Administered 2018-06-05: 50 ug via INTRAMUSCULAR

## 2018-06-05 MED ORDER — HALOPERIDOL LACTATE 5 MG/ML IJ SOLN
2.5000 mg | Freq: Once | INTRAMUSCULAR | Status: AC
  Administered 2018-06-05: 2.5 mg via INTRAVENOUS

## 2018-06-05 MED ORDER — SODIUM CHLORIDE 0.9% FLUSH
3.0000 mL | INTRAVENOUS | Status: DC | PRN
  Administered 2018-06-05 – 2018-06-08 (×4): 3 mL via INTRAVENOUS
  Filled 2018-06-05: qty 3

## 2018-06-05 MED ORDER — GADOBENATE DIMEGLUMINE 529 MG/ML IV SOLN: 20.0000 mL | Freq: Once | INTRAVENOUS | Status: DC | PRN

## 2018-06-05 MED ORDER — LIDOCAINE HCL (PF) 1 % IJ SOLN
INTRAMUSCULAR | Status: AC
  Filled 2018-06-05: qty 30

## 2018-06-05 MED ORDER — HEPARIN SODIUM (PORCINE) 1000 UNIT/ML IJ SOLN
INTRAMUSCULAR | Status: AC
  Filled 2018-06-05: qty 1

## 2018-06-05 MED ORDER — FENTANYL CITRATE (PF) 100 MCG/2ML IJ SOLN
INTRAMUSCULAR | Status: AC
  Administered 2018-06-05: 12.5 ug
  Filled 2018-06-05: qty 2

## 2018-06-05 SURGICAL SUPPLY — 11 items
CATH INFINITI 5 FR MPA2 (CATHETERS) ×2 IMPLANT
CATH INFINITI 5FR ANG PIGTAIL (CATHETERS) ×2 IMPLANT
CATH INFINITI 5FR JL4 (CATHETERS) ×2 IMPLANT
CATH INFINITI JR4 5F (CATHETERS) ×2 IMPLANT
DEVICE RAD COMP TR BAND LRG (VASCULAR PRODUCTS) ×2 IMPLANT
DEVICE RAD TR BAND REGULAR (VASCULAR PRODUCTS) IMPLANT
KIT MANI 3VAL PERCEP (MISCELLANEOUS) ×2 IMPLANT
NEEDLE PERC 21GX4CM (NEEDLE) ×2 IMPLANT
PACK CARDIAC CATH (CUSTOM PROCEDURE TRAY) ×2 IMPLANT
SHEATH RAIN RADIAL 21G 6FR (SHEATH) ×2 IMPLANT
WIRE ROSEN-J .035X260CM (WIRE) ×2 IMPLANT

## 2018-06-05 NOTE — Progress Notes (Signed)
Patient unable to lay still and not move his arm around aggressively.  Concern that he will cause the cath site to start bleeding. Lorazepam 1 mg. Slow IVP given.

## 2018-06-05 NOTE — Progress Notes (Signed)
Late note- Patient transported to Cath lab via bed,O2 on at 2 L N/C.Patient alert.

## 2018-06-05 NOTE — Progress Notes (Signed)
Responded to rapid response. RT services not needed at this time

## 2018-06-05 NOTE — Interval H&P Note (Signed)
History and Physical Interval Note:  06/05/2018 11:16 AM  Stuart Harvey  has presented today for cardiac catheterization, with the diagnosis of NSTEMI and ventricular tachycardia  The various methods of treatment have been discussed with the patient and family. After consideration of risks, benefits and other options for treatment, the patient has consented to  Procedure(s): LEFT HEART CATH AND CORS/GRAFTS ANGIOGRAPHY (N/A) as a surgical intervention .  The patient's history has been reviewed, patient examined, no change in status, stable for surgery.  I have reviewed the patient's chart and labs.  Questions were answered to the patient's satisfaction.    Cath Lab Visit (complete for each Cath Lab visit)  Clinical Evaluation Leading to the Procedure:   ACS: Yes.    Non-ACS:  N/A  Merlinda Wrubel

## 2018-06-05 NOTE — Progress Notes (Signed)
Rapid response called on telemetry unit due to pt developing severe agitation/delirium with violent behavior after receiving 1 mg iv ativan for a scheduled MRI. He was transported to MRI on 07/23, however due to persistent agitation MRI not completed.  He was transported back to his room on the telemetry unit, however due to severe agitation/combativeness rapid response called. Staff unable to redirect pt Code 300 called.  He received 5 mg iv haldol x1 dose without improvement of agitation.  RN paged on call hospitalist, however did not receive call back.  ICU charge nurse notified me of need for provider assistance.  Upon arrival at pts bedside several security officers, nursing staff, and family members present attempting to calm pt down and prevent pt from falling. The pt pulled out all of his iv's, therefore I instructed RN to administer 10 mg intramuscular geodon x1 dose and 50 mcg im fentanyl x1 dose.  Pt responded to interventions with decreased agitation.  Pt transferred to stepdown unit and precedex gtt initiated. Despite precedex gtt at max dose he had another episode of agitation/combativeness he received 2.5 mg iv haldol and 12.5 mcg of iv fentanyl due to verbalization of pain with improvement of agitation. Pt sedated with sluggish bilateral pinpoint pupils, sinus rhythm with BBB on cardiac monitor, faint wheezes throughout and slightly tachypneic, and scattered abrasions throughout.  He is HIGH RISK FOR INTUBATION spoke with intensivist Dr. Mortimer Fries via telephone he agrees with current plan of care.  He will need MR Brain to further assess encephalopathy once agitation improves and he is able to tolerate test. Will continue to monitor and assess pt.  Pts family updated regarding plan of care and questions answered.   Marda Stalker, [place] Pager [phone] (please enter 7 digits) PCCM Consult Pager [phone] (please enter 7 digits)

## 2018-06-05 NOTE — Progress Notes (Signed)
Progress Note  Patient Name: Stuart Harvey Date of Encounter: 06/05/2018  Primary Cardiologist: Fletcher Anon  Subjective   No complaints today, denying chest pain and shortness of breath.  Noted to be somewhat confused overnight by wife and sitter.  Inpatient Medications    Scheduled Meds: . [START ON 06/06/2018] aspirin  81 mg Oral Pre-Cath  . aspirin EC  81 mg Oral Daily  . buPROPion  200 mg Oral BID  . citalopram  40 mg Oral Daily  . enoxaparin (LOVENOX) injection  40 mg Subcutaneous Q24H  . levothyroxine  50 mcg Oral BH-q7a  . rosuvastatin  5 mg Oral q1800  . silver sulfADIAZINE   Topical BID  . sodium chloride flush  3 mL Intravenous Q12H  . sodium chloride flush  3 mL Intravenous Q12H  . tamsulosin  0.4 mg Oral Daily  . thiamine  100 mg Oral Daily   Continuous Infusions: . sodium chloride    . sodium chloride    . lactated ringers 50 mL/hr at 06/05/18 0200   PRN Meds: sodium chloride, acetaminophen **OR** [DISCONTINUED] acetaminophen, bisacodyl, fentaNYL (SUBLIMAZE) injection, ipratropium-albuterol, lip balm, [DISCONTINUED] ondansetron **OR** ondansetron (ZOFRAN) IV, sennosides, sodium chloride flush   Vital Signs    Vitals:   06/05/18 0400 06/05/18 0500 06/05/18 0600 06/05/18 0801  BP: 102/61 115/63 120/75 131/74  Pulse: 70 73 68 72  Resp: 17 17 14 18   Temp:    98.7 F (37.1 C)  TempSrc:    Oral  SpO2: 93% 92% 97%   Weight:      Height:        Intake/Output Summary (Last 24 hours) at 06/05/2018 0934 Last data filed at 06/05/2018 0800 Gross per 24 hour  Intake 572.5 ml  Output 725 ml  Net -152.5 ml   Filed Weights   06/02/18 2030 06/03/18 0432 06/04/18 0530  Weight: 202 lb 13.2 oz (92 kg) 202 lb 13.2 oz (92 kg) 210 lb 1.6 oz (95.3 kg)    Telemetry    Normal sinus rhythm with frequent PACs and PVCs- Personally Reviewed  ECG    No new tracing  Physical Exam   GEN: No acute distress.   Neck: No JVD Cardiac:  Regular rate and rhythm with  occasional extrasystoles. Respiratory:  Mildly diminished breath sounds throughout without wheezes or crackles. GI: Soft, nontender, non-distended  MS: No edema; notable abrasions on upper and lower extremities. Neuro:  Nonfocal  Psych: Normal affect.  Alert and oriented but with impaired short-term memory.  Labs    Chemistry Recent Labs  Lab 06/02/18 1641 06/02/18 2301 06/03/18 0413 06/04/18 0128 06/05/18 0246  NA 144 143 146* 139 140  K 4.4 4.2 3.8 3.8 4.0  CL 111 116* 114* 107 106  CO2 16* 20* 25 26 31   GLUCOSE 170* 125* 125* 109* 106*  BUN 31* 29* 28* 21 18  CREATININE 2.00* 1.52* 1.42* 1.15 1.09  CALCIUM 9.9 8.1* 8.1* 8.2* 8.4*  PROT 8.0 5.7* 5.5*  --   --   ALBUMIN 4.4 3.1* 3.0*  --   --   AST 55* 58* 63*  --   --   ALT 26 24 26   --   --   ALKPHOS 103 70 68  --   --   BILITOT 1.2 1.1 1.2  --   --   GFRNONAA 33* 46* 50* >60 >60  GFRAA 38* 53* 58* >60 >60  ANIONGAP 17* 7 7 6  3*     Hematology Recent  Labs  Lab 06/02/18 2301 06/03/18 0413 06/05/18 0246  WBC 13.7* 11.3* 8.7  RBC 4.09* 3.91* 3.54*  HGB 13.0 12.5* 11.4*  HCT 39.0* 36.9* 33.7*  MCV 95.2 94.4 95.2  MCH 31.8 32.0 32.3  MCHC 33.4 33.9 33.9  RDW 13.6 13.5 13.1  PLT 162 175 121*    Cardiac Enzymes Recent Labs  Lab 06/02/18 2301 06/03/18 0412 06/03/18 0956 06/04/18 0128  TROPONINI 2.26* 2.70* 2.60* 1.03*   No results for input(s): TROPIPOC in the last 168 hours.   BNPNo results for input(s): BNP, PROBNP in the last 168 hours.   DDimer No results for input(s): DDIMER in the last 168 hours.   Radiology    No results found.  Cardiac Studies   Echo (06/03/2018): Oddly dilated LV with low normal systolic function (EF 50 to 55%).  No obvious wall motion abnormality.  Normal diastolic function.  Moderately dilated a sending aorta, measuring 4.4 cm.  Mildly to moderately dilated left atrium.  Normal RV size and function.  Mild to moderate pulmonary hypertension.  Patient Profile     67  y.o. male with history of coronary artery disease status post CABG in 2008, abdominal aortic aneurysm status post open surgical repair in October 2008, hypertension hyperlipidemia who was brought by EMS over the weekend after possible out of hospital cardiac arrest.  He was out in the sun and was found by his wife with multiple injuries suggestive of possible seizure activity.  According to EMS, he was in ventricular tachycardia that required one shock.  Assessment & Plan    NSTEMI Most likely due to supply-demand mismatch.  However, given report of ventricular tachycardia and known CAD with CABG greater than 10 years ago, I agree with cardiac catheterization prior to discharge.  Though Mr. Burnley still has impaired short-term memory, he continues to improve from a mental status standpoint.  I think it is reasonable to proceed with catheterization today.  I have reviewed the risks, indications, and alternatives to cardiac catheterization, possible angioplasty, and stenting with the patient. Risks include but are not limited to bleeding, infection, vascular injury, stroke, myocardial infection, arrhythmia, kidney injury, radiation-related injury in the case of prolonged fluoroscopy use, emergency cardiac surgery, and death. The patient understands the risks of serious complication is 1-2 in 1791 with diagnostic cardiac cath and 1-2% or less with angioplasty/stenting.  Plan for catheterization today.  Continue aspirin 81 mg daily.  Heparin infusion discontinued yesterday.  Ventricular tachycardia Frequent PACs and PVCs noted on telemetry but no sustained arrhythmias.  Marked QT prolongation evident on EKG.  Avoid QT prolonging medications.  Will start metoprolol tartrate 12.5 mg twice daily.  Hyperlipidemia LDL suboptimally controlled at 124 on admission (goal less than 70).  Hold rosuvastatin in the setting of rhabdomyolysis.  Anticipate escalation of rosuvastatin to achieve optimal LDL after  resolution of acute illness and rhabdo.  Possible heatstroke Circumstances remain unclear.  Certainly heat stroke could have caused the patient's syncope, though primary arrhythmogenic event leading to syncope and heat exposure cannot be excluded.  Plan for catheterization today.  Continue supportive care per critical care medicine and hospitalist service.  Acute kidney failure Resolved with creatinine back to baseline.  Continue gentle hydration in the setting of rhabdomyolysis.  Avoid nephrotoxic drugs.    For questions or updates, please contact [place] Please consult [email or web address] for contact info under [facility]    Signed, Nelva Bush, MD  06/05/2018, 9:34 AM

## 2018-06-05 NOTE — Care Management (Addendum)
Patient transferred out of ICU 2A from icu this afternoon.  Patient admitted after being found down in his yard.  Required shock for ventricular tachycardia. He required intubation for airway protection due to confusion and agitation. Was admitted with sepsis of unknown origin but it is now thought symptoms are due to heat stroke.  Prior to this episode of illness, patient was independent in all adls.  Current with pcp and no issues accessing medical care or obtaining medications. 02 is acute.

## 2018-06-05 NOTE — Progress Notes (Signed)
Some improvement with the prn ativan. He is less "frantic" than before.

## 2018-06-05 NOTE — Progress Notes (Signed)
Report given to [facility] on 2A. Pt transferred to room 258 in bed and tele placed. Delane Ginger made aware of pts arrival.

## 2018-06-05 NOTE — Progress Notes (Signed)
2A overflow. We are available as needed  Merton Border, MD PCCM service Mobile [phone] Pager [phone] 06/05/2018 8:55 AM

## 2018-06-05 NOTE — Progress Notes (Signed)
Patient was agitated and violent IV ativan did not help Patient very agitated needs precedex drip Moved back to icu Intensivist elink informed

## 2018-06-05 NOTE — H&P (View-Only) (Signed)
Progress Note  Patient Name: Stuart Harvey Date of Encounter: 06/05/2018  Primary Cardiologist: Stuart Harvey  Subjective   No complaints today, denying chest pain and shortness of breath.  Noted to be somewhat confused overnight by wife and sitter.  Inpatient Medications    Scheduled Meds: . [START ON 06/06/2018] aspirin  81 mg Oral Pre-Cath  . aspirin EC  81 mg Oral Daily  . buPROPion  200 mg Oral BID  . citalopram  40 mg Oral Daily  . enoxaparin (LOVENOX) injection  40 mg Subcutaneous Q24H  . levothyroxine  50 mcg Oral BH-q7a  . rosuvastatin  5 mg Oral q1800  . silver sulfADIAZINE   Topical BID  . sodium chloride flush  3 mL Intravenous Q12H  . sodium chloride flush  3 mL Intravenous Q12H  . tamsulosin  0.4 mg Oral Daily  . thiamine  100 mg Oral Daily   Continuous Infusions: . sodium chloride    . sodium chloride    . lactated ringers 50 mL/hr at 06/05/18 0200   PRN Meds: sodium chloride, acetaminophen **OR** [DISCONTINUED] acetaminophen, bisacodyl, fentaNYL (SUBLIMAZE) injection, ipratropium-albuterol, lip balm, [DISCONTINUED] ondansetron **OR** ondansetron (ZOFRAN) IV, sennosides, sodium chloride flush   Vital Signs    Vitals:   06/05/18 0400 06/05/18 0500 06/05/18 0600 06/05/18 0801  BP: 102/61 115/63 120/75 131/74  Pulse: 70 73 68 72  Resp: 17 17 14 18   Temp:    98.7 F (37.1 C)  TempSrc:    Oral  SpO2: 93% 92% 97%   Weight:      Height:        Intake/Output Summary (Last 24 hours) at 06/05/2018 0934 Last data filed at 06/05/2018 0800 Gross per 24 hour  Intake 572.5 ml  Output 725 ml  Net -152.5 ml   Filed Weights   06/02/18 2030 06/03/18 0432 06/04/18 0530  Weight: 202 lb 13.2 oz (92 kg) 202 lb 13.2 oz (92 kg) 210 lb 1.6 oz (95.3 kg)    Telemetry    Normal sinus rhythm with frequent PACs and PVCs- Personally Reviewed  ECG    No new tracing  Physical Exam   GEN: No acute distress.   Neck: No JVD Cardiac:  Regular rate and rhythm with  occasional extrasystoles. Respiratory:  Mildly diminished breath sounds throughout without wheezes or crackles. GI: Soft, nontender, non-distended  MS: No edema; notable abrasions on upper and lower extremities. Neuro:  Nonfocal  Psych: Normal affect.  Alert and oriented but with impaired short-term memory.  Labs    Chemistry Recent Labs  Lab 06/02/18 1641 06/02/18 2301 06/03/18 0413 06/04/18 0128 06/05/18 0246  NA 144 143 146* 139 140  K 4.4 4.2 3.8 3.8 4.0  CL 111 116* 114* 107 106  CO2 16* 20* 25 26 31   GLUCOSE 170* 125* 125* 109* 106*  BUN 31* 29* 28* 21 18  CREATININE 2.00* 1.52* 1.42* 1.15 1.09  CALCIUM 9.9 8.1* 8.1* 8.2* 8.4*  PROT 8.0 5.7* 5.5*  --   --   ALBUMIN 4.4 3.1* 3.0*  --   --   AST 55* 58* 63*  --   --   ALT 26 24 26   --   --   ALKPHOS 103 70 68  --   --   BILITOT 1.2 1.1 1.2  --   --   GFRNONAA 33* 46* 50* >60 >60  GFRAA 38* 53* 58* >60 >60  ANIONGAP 17* 7 7 6  3*     Hematology Recent  Labs  Lab 06/02/18 2301 06/03/18 0413 06/05/18 0246  WBC 13.7* 11.3* 8.7  RBC 4.09* 3.91* 3.54*  HGB 13.0 12.5* 11.4*  HCT 39.0* 36.9* 33.7*  MCV 95.2 94.4 95.2  MCH 31.8 32.0 32.3  MCHC 33.4 33.9 33.9  RDW 13.6 13.5 13.1  PLT 162 175 121*    Cardiac Enzymes Recent Labs  Lab 06/02/18 2301 06/03/18 0412 06/03/18 0956 06/04/18 0128  TROPONINI 2.26* 2.70* 2.60* 1.03*   No results for input(s): TROPIPOC in the last 168 hours.   BNPNo results for input(s): BNP, PROBNP in the last 168 hours.   DDimer No results for input(s): DDIMER in the last 168 hours.   Radiology    No results found.  Cardiac Studies   Echo (06/03/2018): Oddly dilated LV with low normal systolic function (EF 50 to 55%).  No obvious wall motion abnormality.  Normal diastolic function.  Moderately dilated a sending aorta, measuring 4.4 cm.  Mildly to moderately dilated left atrium.  Normal RV size and function.  Mild to moderate pulmonary hypertension.  Patient Profile     67  y.o. male with history of coronary artery disease status post CABG in 2008, abdominal aortic aneurysm status post open surgical repair in October 2008, hypertension hyperlipidemia who was brought by EMS over the weekend after possible out of hospital cardiac arrest.  He was out in the sun and was found by his wife with multiple injuries suggestive of possible seizure activity.  According to EMS, he was in ventricular tachycardia that required one shock.  Assessment & Plan    NSTEMI Most likely due to supply-demand mismatch.  However, given report of ventricular tachycardia and known CAD with CABG greater than 10 years ago, I agree with cardiac catheterization prior to discharge.  Though Mr. Stammer still has impaired short-term memory, he continues to improve from a mental status standpoint.  I think it is reasonable to proceed with catheterization today.  I have reviewed the risks, indications, and alternatives to cardiac catheterization, possible angioplasty, and stenting with the patient. Risks include but are not limited to bleeding, infection, vascular injury, stroke, myocardial infection, arrhythmia, kidney injury, radiation-related injury in the case of prolonged fluoroscopy use, emergency cardiac surgery, and death. The patient understands the risks of serious complication is 1-2 in 1610 with diagnostic cardiac cath and 1-2% or less with angioplasty/stenting.  Plan for catheterization today.  Continue aspirin 81 mg daily.  Heparin infusion discontinued yesterday.  Ventricular tachycardia Frequent PACs and PVCs noted on telemetry but no sustained arrhythmias.  Marked QT prolongation evident on EKG.  Avoid QT prolonging medications.  Will start metoprolol tartrate 12.5 mg twice daily.  Hyperlipidemia LDL suboptimally controlled at 124 on admission (goal less than 70).  Hold rosuvastatin in the setting of rhabdomyolysis.  Anticipate escalation of rosuvastatin to achieve optimal LDL after  resolution of acute illness and rhabdo.  Possible heatstroke Circumstances remain unclear.  Certainly heat stroke could have caused the patient's syncope, though primary arrhythmogenic event leading to syncope and heat exposure cannot be excluded.  Plan for catheterization today.  Continue supportive care per critical care medicine and hospitalist service.  Acute kidney failure Resolved with creatinine back to baseline.  Continue gentle hydration in the setting of rhabdomyolysis.  Avoid nephrotoxic drugs.    For questions or updates, please contact [place] Please consult [email or web address] for contact info under [facility]    Signed, Nelva Bush, MD  06/05/2018, 9:34 AM

## 2018-06-05 NOTE — Progress Notes (Addendum)
[name] at [place] NAME: Stuart Harvey    MR#:  [id number]  DATE OF BIRTH:  [birth year]  SUBJECTIVE:  Patient briefly seen in cardiac catheterization lab  Denies chest pain and shortness of breath  Still with some confusion REVIEW OF SYSTEMS:    Review of Systems  Constitutional: Negative for chills, fever and malaise/fatigue.  HENT: Negative.  Negative for ear discharge, ear pain, hearing loss, nosebleeds and sore throat.   Eyes: Negative.  Negative for blurred vision and pain.  Respiratory: Negative.  Negative for cough, hemoptysis, shortness of breath and wheezing.   Cardiovascular: Negative.  Negative for chest pain, palpitations and leg swelling.  Gastrointestinal: Negative.  Negative for abdominal pain, blood in stool, diarrhea, nausea and vomiting.  Genitourinary: Negative.  Negative for dysuria.  Musculoskeletal: Negative.  Negative for back pain.  Skin: Negative.   Neurological: Negative for dizziness, tremors, speech change, focal weakness, seizures and headaches.  Endo/Heme/Allergies: Negative.  Does not bruise/bleed easily.  Psychiatric/Behavioral: Positive for memory loss. Negative for depression, hallucinations and suicidal ideas.       Tolerating Diet:NPO      DRUG ALLERGIES:   Allergies  Allergen Reactions  . Codeine Nausea Only  . Tetracycline Rash    VITALS:  Blood pressure 119/84, pulse 75, temperature 98.6 F (37 C), temperature source Oral, resp. rate 20, height 6\' 2"  (1.88 m), weight 95.3 kg (210 lb), SpO2 96 %.  PHYSICAL EXAMINATION:  Constitutional: Appears well-developed and well-nourished.  No distress. HENT: Normocephalic. .  Eyes: Conjunctivae are normal. no scleral icterus.  Neck: Normal ROM. Neck supple. No JVD. No tracheal deviation. CVS: RRR, S1/S2 +, no murmurs, no gallops, no carotid bruit.  Pulmonary: Effort and breath sounds normal, no stridor, rhonchi, wheezes, rales.  Abdominal:  Soft. BS +,  no distension, tenderness, rebound or guarding.  Musculoskeletal: Normal range of motion. No edema and no tenderness.  Neuro: Alert with some mild confusion. Moves all extremities on ventilator.   No focal deficits. Skin: Skin is warm and dry. No rash noted.  Multiple bruises noted Psychiatric: Patient is awake with some mild confusion  LABORATORY PANEL:   CBC Recent Labs  Lab 06/05/18 0246  WBC 8.7  HGB 11.4*  HCT 33.7*  PLT 121*   ------------------------------------------------------------------------------------------------------------------  Chemistries  Recent Labs  Lab 06/03/18 0413 06/04/18 0128 06/05/18 0246  NA 146* 139 140  K 3.8 3.8 4.0  CL 114* 107 106  CO2 25 26 31   GLUCOSE 125* 109* 106*  BUN 28* 21 18  CREATININE 1.42* 1.15 1.09  CALCIUM 8.1* 8.2* 8.4*  MG 1.9 1.9  --   AST 63*  --   --   ALT 26  --   --   ALKPHOS 68  --   --   BILITOT 1.2  --   --    ------------------------------------------------------------------------------------------------------------------  Cardiac Enzymes Recent Labs  Lab 06/03/18 0412 06/03/18 0956 06/04/18 0128  TROPONINI 2.70* 2.60* 1.03*   ------------------------------------------------------------------------------------------------------------------  RADIOLOGY:  No results found.   ASSESSMENT AND PLAN:   67 year old male who presented to the emergency room after being found on his porch by his wife.  1.  Acute respiratory failure, hypoxic due to confusion and agitation requiring temporary intubation: Patient was combative and agitated and was intubated for airway protection. Patient has been extubated and is doing well.   Sepsis has been ruled out.   2. NSTEMI  with troponin max 2.7 and inferior EKG  changes: Cardiac catheterization shows severe native CAD with chronic total occlusions of the LAD, proximal L circumflex and proximal RCA.  There is diffuse disease of SVG to OM 2 graft as  well  Radiology is recommending medical therapy. Continue aspirin and if platelets stabilize would add Plavix for 12 months. Continue metoprolol and statin. Consider PCI to SVG to OM 2 with patient has signs and symptoms of ischemia. PCI deferred today given patient's inability to remain still during diagnostic catheterization and concern for vascular injury as per cardiology. LDL 124  3.  Acute encephalopathy requiring intubation for airway protection with negative head CT: Acute encephalopathy may be due to to heatstroke Effusion seems to improving as per family  4.  Hypothyroidism: Continue Synthroid 5.  Depression: Continue Wellbutrin and citalopram   6.  BPH: Continue Flomax.  7.  Syncope with collapse and mild rhabdo: Continue IV fluids  Ck with some improvement.   CK for a.m.  Case discussed with cardiology  Management plans discussed with the patient and he is in agreement.  CODE STATUS: Full  TOTAL TIME TAKING CARE OF THIS PATIENT: 24 minutes.     POSSIBLE D/C to 2-4 days, DEPENDING ON CLINICAL CONDITION.   Stuart Harvey M.D on 06/05/2018 at 12:19 PM  Between 7am to 6pm - Pager - [phone] After 6pm go to [email or web address] - password [email or web address] [facility]  Office  [phone]  CC: Primary care physician; Stuart Haddock, NP  Note: This dictation was prepared with Dragon dictation along with smaller phrase technology. Any transcriptional errors that result from this process are unintentional.

## 2018-06-05 NOTE — Progress Notes (Signed)
Chaplain received page for Rapid Response. Chaplain went to patient room and care team was there. Nurse stated that it was not a rapid response, the patient was fighting team. Chaplain made pastoral care known and left. Nurse page security.

## 2018-06-05 NOTE — Brief Op Note (Addendum)
BRIEF CARDIAC CATHETERIZATION NOTE  DATE: 06/05/2018 TIME: 12:10 PM  PATIENT:  Stuart Harvey  67 y.o. male  PRE-OPERATIVE DIAGNOSIS:  NSTEMI and ventricular tachycardia  POST-OPERATIVE DIAGNOSIS: Same  PROCEDURE:  Procedure(s): LEFT HEART CATH AND CORS/GRAFTS ANGIOGRAPHY (N/A)  SURGEON:  Surgeon(s) and Role:    * Abbey Veith, Harrell Gave, MD - Primary  FINDINGS: 1. Severe native CAD with chronic total occlusions of the LAD, proximal LCx, and proximal RCA. 2. Widely patent LIMA to LAD, SVG to OM1, and SVG to PDA. 3. Patent SVG to OM3.  There is diffuse disease of the graft as well as to focal 60 to 80% stenoses in the midportion just distal to a valve.  RECOMMENDATIONS: 1. Medical therapy.  Continue indefinite low-dose aspirin.  If platelets stabilize, would advocate for 12 months of clopidogrel 75 mg daily. 2. Start metoprolol tartrate 25 mg twice daily. 3. Restart high intensity statin therapy with goal LDL less than 70 once rhabdomyolysis has resolved. 4. Could consider PCI to SVG to OM3 if patient has signs/symptoms of ischemia or continued ventricular tachycardia.  PCI deferred today given patient's inability to remain still during diagnostic catheterization and concern for vascular injury during and after PCI, as well as relatively small territory supplied by this vessel.  Nelva Bush, MD [facility] Pager: [phone]

## 2018-06-05 NOTE — Progress Notes (Signed)
Chaplain received page from nurse, nurse stated patient wife could use Chaplain support. Chaplain went to room and knock on door, officer came to door and said they were working with patient. Chaplain ask family member where was patient's wife, family member said they took her to use restroom. Chaplain waited for wife and gave emotional support. Patient was moved to ICU and Chaplain talk with patient to make sure she was calm down. Chaplain told wife to call if she needs her.

## 2018-06-05 NOTE — Progress Notes (Signed)
Pt. Received from ICU. Pt. Speech clear; pt. Pleasant, oriented to immediate surroundings and month/year. Pt. Restless, yet cooperative;Very conversive. Noted excessive ecchymosis to UE and LE with mult. Sm. Open wounds. Right upper arm "weeping" . Pt. Prepped for cath. Stable for exam. Tele: NSR.

## 2018-06-06 ENCOUNTER — Encounter: Payer: Self-pay | Admitting: Internal Medicine

## 2018-06-06 DIAGNOSIS — I248 Other forms of acute ischemic heart disease: Secondary | ICD-10-CM

## 2018-06-06 DIAGNOSIS — R4689 Other symptoms and signs involving appearance and behavior: Secondary | ICD-10-CM

## 2018-06-06 DIAGNOSIS — R451 Restlessness and agitation: Secondary | ICD-10-CM

## 2018-06-06 LAB — COMPREHENSIVE METABOLIC PANEL WITH GFR
ALT: 53 U/L — ABNORMAL HIGH (ref 0–44)
AST: 107 U/L — ABNORMAL HIGH (ref 15–41)
Albumin: 3.1 g/dL — ABNORMAL LOW (ref 3.5–5.0)
Alkaline Phosphatase: 69 U/L (ref 38–126)
Anion gap: 4 — ABNORMAL LOW (ref 5–15)
BUN: 17 mg/dL (ref 8–23)
CO2: 26 mmol/L (ref 22–32)
Calcium: 8.5 mg/dL — ABNORMAL LOW (ref 8.9–10.3)
Chloride: 108 mmol/L (ref 98–111)
Creatinine, Ser: 0.95 mg/dL (ref 0.61–1.24)
GFR calc Af Amer: >60 mL/min
GFR calc non Af Amer: >60 mL/min
Glucose, Bld: 127 mg/dL — ABNORMAL HIGH (ref 70–99)
Potassium: 3.6 mmol/L (ref 3.5–5.1)
Sodium: 138 mmol/L (ref 135–145)
Total Bilirubin: 1.2 mg/dL (ref 0.3–1.2)
Total Protein: 6.2 g/dL — ABNORMAL LOW (ref 6.5–8.1)

## 2018-06-06 LAB — CBC
HCT: 34.5 % — ABNORMAL LOW (ref 40.0–52.0)
Hemoglobin: 11.8 g/dL — ABNORMAL LOW (ref 13.0–18.0)
MCH: 32.4 pg (ref 26.0–34.0)
MCHC: 34.2 g/dL (ref 32.0–36.0)
MCV: 94.7 fL (ref 80.0–100.0)
Platelets: 132 K/uL — ABNORMAL LOW (ref 150–440)
RBC: 3.65 MIL/uL — ABNORMAL LOW (ref 4.40–5.90)
RDW: 13.1 % (ref 11.5–14.5)
WBC: 8.6 K/uL (ref 3.8–10.6)

## 2018-06-06 LAB — CSF CULTURE W GRAM STAIN: Culture: NO GROWTH

## 2018-06-06 LAB — CK: CK TOTAL: 2416 U/L — AB (ref 49–397)

## 2018-06-06 LAB — CSF CULTURE: GRAM STAIN: NONE SEEN

## 2018-06-06 MED ORDER — QUETIAPINE FUMARATE 25 MG PO TABS
50.0000 mg | ORAL_TABLET | Freq: Every day | ORAL | Status: DC
  Administered 2018-06-06: 50 mg via ORAL
  Filled 2018-06-06: qty 2

## 2018-06-06 MED ORDER — HYDRALAZINE HCL 20 MG/ML IJ SOLN
10.0000 mg | INTRAMUSCULAR | Status: DC | PRN
  Administered 2018-06-06: 10 mg via INTRAVENOUS
  Administered 2018-06-08: 20 mg via INTRAVENOUS
  Filled 2018-06-06 (×2): qty 1

## 2018-06-06 MED ORDER — CITALOPRAM HYDROBROMIDE 20 MG PO TABS
20.0000 mg | ORAL_TABLET | Freq: Every day | ORAL | Status: DC
  Administered 2018-06-06 – 2018-06-08 (×3): 20 mg via ORAL
  Filled 2018-06-06 (×3): qty 1

## 2018-06-06 MED ORDER — LEVOTHYROXINE SODIUM 50 MCG PO TABS
75.0000 ug | ORAL_TABLET | ORAL | Status: DC
  Administered 2018-06-07 – 2018-06-08 (×2): 75 ug via ORAL
  Filled 2018-06-06: qty 2
  Filled 2018-06-06: qty 1

## 2018-06-06 MED ORDER — QUETIAPINE FUMARATE 25 MG PO TABS
25.0000 mg | ORAL_TABLET | ORAL | Status: DC
  Administered 2018-06-06 – 2018-06-07 (×2): 25 mg via ORAL
  Filled 2018-06-06 (×2): qty 1

## 2018-06-06 MED ORDER — KCL IN DEXTROSE-NACL 20-5-0.45 MEQ/L-%-% IV SOLN
INTRAVENOUS | Status: DC
Start: 2018-06-06 — End: 2018-06-07
  Administered 2018-06-06 – 2018-06-07 (×2): via INTRAVENOUS
  Filled 2018-06-06 (×3): qty 1000

## 2018-06-06 NOTE — Progress Notes (Signed)
[place] at [place] NAME: Stuart Harvey    MR#:  [id number]  DATE OF BIRTH:  1951-11-02  SUBJECTIVE:  Patient agitated yesterday and was on Precedex drip.  He is now off of Precedex drip.  He has Some confusion this morning however not agitated REVIEW OF SYSTEMS:    Review of Systems  Constitutional: Negative for chills, fever and malaise/fatigue.  HENT: Negative.  Negative for ear discharge, ear pain, hearing loss, nosebleeds and sore throat.   Eyes: Negative.  Negative for blurred vision and pain.  Respiratory: Negative.  Negative for cough, hemoptysis, shortness of breath and wheezing.   Cardiovascular: Negative.  Negative for chest pain, palpitations and leg swelling.  Gastrointestinal: Negative.  Negative for abdominal pain, blood in stool, diarrhea, nausea and vomiting.  Genitourinary: Negative.  Negative for dysuria.  Musculoskeletal: Negative.  Negative for back pain.  Skin: Negative.   Neurological: Negative for dizziness, tremors, speech change, focal weakness, seizures and headaches.  Endo/Heme/Allergies: Negative.  Does not bruise/bleed easily.  Psychiatric/Behavioral: Positive for memory loss. Negative for depression, hallucinations and suicidal ideas.       Tolerating Diet: Yes     DRUG ALLERGIES:   Allergies  Allergen Reactions  . Benzodiazepines     Paradoxical agitation and delirium  . Codeine Nausea Only  . Tetracycline Rash    VITALS:  Blood pressure 112/71, pulse (!) 54, temperature (!) 97.5 F (36.4 C), temperature source Oral, resp. rate 19, height 6' (1.829 m), weight 100.1 kg (220 lb 10.9 oz), SpO2 100 %.  PHYSICAL EXAMINATION:  Constitutional: Appears well-developed and well-nourished.  No distress. HENT: Normocephalic. .  Eyes: Conjunctivae are normal. no scleral icterus.  Neck: Normal ROM. Neck supple. No JVD. No tracheal deviation. CVS: RRR, S1/S2 +, no murmurs, no gallops, no carotid bruit.   Pulmonary: Effort and breath sounds normal, no stridor, rhonchi, wheezes, rales.  Abdominal: Soft. BS +,  no distension, tenderness, rebound or guarding.  Musculoskeletal: Normal range of motion. No edema and no tenderness.  Neuro: Alert with some mild confusion. Moves all extremities on ventilator.   No focal deficits. Skin: Skin is warm and dry. No rash noted.  Multiple bruises noted Psychiatric: Patient is awake with some mild confusion  LABORATORY PANEL:   CBC Recent Labs  Lab 06/06/18 0632  WBC 8.6  HGB 11.8*  HCT 34.5*  PLT 132*   ------------------------------------------------------------------------------------------------------------------  Chemistries  Recent Labs  Lab 06/04/18 0128  06/06/18 0632  NA 139   < > 138  K 3.8   < > 3.6  CL 107   < > 108  CO2 26   < > 26  GLUCOSE 109*   < > 127*  BUN 21   < > 17  CREATININE 1.15   < > 0.95  CALCIUM 8.2*   < > 8.5*  MG 1.9  --   --   AST  --   --  107*  ALT  --   --  53*  ALKPHOS  --   --  69  BILITOT  --   --  1.2   < > = values in this interval not displayed.   ------------------------------------------------------------------------------------------------------------------  Cardiac Enzymes Recent Labs  Lab 06/03/18 0412 06/03/18 0956 06/04/18 0128  TROPONINI 2.70* 2.60* 1.03*   ------------------------------------------------------------------------------------------------------------------  RADIOLOGY:  No results found.   ASSESSMENT AND PLAN:   67 year old male who presented to the emergency room after being found on his porch  by his wife.  1.  Acute respiratory failure, hypoxic due to confusion and agitation requiring temporary intubation: Patient was combative and agitated and was intubated for airway protection. Patient has been extubated and is doing well.   Sepsis has been ruled out.   2. NSTEMI  with troponin max 2.7 and inferior EKG changes: Cardiac catheterization shows severe  native CAD with chronic total occlusions of the LAD, proximal L circumflex and proximal RCA.  There is diffuse disease of SVG to OM 2 graft as well  cardiology is recommending medical therapy. Continue aspirin and Plavix for 12 months. Continue metoprolol and statin when CK is improved. Consider PCI to SVG to OM 2 with patient has signs and symptoms of ischemia. PCI deferred today given patient's inability to remain still during diagnostic catheterization and concern for vascular injury as per cardiology. LDL 124  3.  Acute encephalopathy requiring intubation for airway protection with negative head CT: Acute encephalopathy may be due to to heatstroke   4.  Hypothyroidism: Continue Synthroid 5.  Depression: Continue Wellbutrin and citalopram   6.  BPH: Continue Flomax.  7.  Syncope with collapse and mild rhabdo: Continue IV fluids  Ck with mprovement.   CK for a.m.  Case discussed with cardiology  Management plans discussed with the patient and he is in agreement.  CODE STATUS: Full  TOTAL TIME TAKING CARE OF THIS PATIENT: 24 minutes.     POSSIBLE D/C to 2-4 days, DEPENDING ON CLINICAL CONDITION.   Gaylin Osoria M.D on 06/06/2018 at 1:12 PM  Between 7am to 6pm - Pager - [phone] After 6pm go to [email or web address] - password [email or web address] [facility]  Office  [phone]  CC: Primary care physician; Kathrine Haddock, NP  Note: This dictation was prepared with Dragon dictation along with smaller phrase technology. Any transcriptional errors that result from this process are unintentional.

## 2018-06-06 NOTE — Progress Notes (Signed)
Yesterday, underwent LHC with findings as described.  Medical management recommended. For delirium, went for MRI.  Was too agitated for study and received lorazepam.  After this, became very combative and agitated requiring transfer back to ICU/SDU for dexmedetomidine infusion.  This morning, dexmedetomidine infusion was discontinued.  He has remained calm and somnolent throughout the day.  He remains poorly oriented but in no distress.  He does know that he is in a hospital.  Vitals:   06/06/18 1500 06/06/18 1504 06/06/18 1554 06/06/18 1600  BP: (!) 45/31 (!) 98/58  118/79  Pulse: 75 73  73  Resp: (!) 22 (!) 24    Temp:   100.1 F (37.8 C)   TempSrc:   Oral   SpO2: 97% 94%  97%  Weight:      Height:       Gen: NAD HEENT: NCAT, sclera white Neck: No JVD Lungs: No adventitious sounds Cardiovascular: Bradycardia, regular, no murmurs Abdomen: Soft, nontender, normal BS Ext: without clubbing, cyanosis, edema Neuro: grossly intact Skin: Multiple excoriations and abrasions on all extremities  BMP Latest Ref Rng & Units 06/06/2018 06/05/2018 06/04/2018  Glucose 70 - 99 mg/dL 127(H) 106(H) 109(H)  BUN 8 - 23 mg/dL 17 18 21   Creatinine 0.61 - 1.24 mg/dL 0.95 1.09 1.15  BUN/Creat Ratio 10 - 24 - - -  Sodium 135 - 145 mmol/L 138 140 139  Potassium 3.5 - 5.1 mmol/L 3.6 4.0 3.8  Chloride 98 - 111 mmol/L 108 106 107  CO2 22 - 32 mmol/L 26 31 26   Calcium 8.9 - 10.3 mg/dL 8.5(L) 8.4(L) 8.2(L)    CBC Latest Ref Rng & Units 06/06/2018 06/05/2018 06/03/2018  WBC 3.8 - 10.6 K/uL 8.6 8.7 11.3(H)  Hemoglobin 13.0 - 18.0 g/dL 11.8(L) 11.4(L) 12.5(L)  Hematocrit 40.0 - 52.0 % 34.5(L) 33.7(L) 36.9(L)  Platelets 150 - 440 K/uL 132(L) 121(L) 175   CXR: No new film  IMPRESSION: Acute respiratory failure, resolved Acute heatstroke, resolving Non-STEMI Sustained VT, status post cardioversion Rhabdomyolysis  AKI, resolved Severe agitated delirium/acute encephalopathy  PLAN/REC: Watch in SDU  through today off of dexmedetomidine Holding bupropion Citalopram dose decreased Avoid all benzodiazepines Low-dose quetiapine initiated Continue supplemental oxygen as needed to maintain SPO2 >90% IVFs adjusted Continue medical management for CAD per cardiology recommendations Monitor BMET intermittently Monitor I/Os Correct electrolytes as indicated   Merton Border, MD PCCM service Mobile [phone] Pager [phone] 06/06/2018 4:19 PM

## 2018-06-06 NOTE — Progress Notes (Signed)
Progress Note  Patient Name: Stuart Harvey Date of Encounter: 06/06/2018  Primary Cardiologist: Fletcher Anon  Subjective   Agitated 7/23 with MRI. Required multiple people to calm. Now on Precedex. Unable to get MRI. Sitter and wife in room. CK trending down. PLT count relatively stable. HGB low, though stable.   Inpatient Medications    Scheduled Meds: . aspirin EC  81 mg Oral Daily  . buPROPion  200 mg Oral BID  . citalopram  40 mg Oral Daily  . enoxaparin (LOVENOX) injection  40 mg Subcutaneous Q24H  . levothyroxine  50 mcg Oral BH-q7a  . metoprolol tartrate  12.5 mg Oral BID  . metoprolol tartrate  25 mg Oral BID  . nicotine  14 mg Transdermal Daily  . silver sulfADIAZINE   Topical BID  . sodium chloride flush  3 mL Intravenous Q12H  . tamsulosin  0.4 mg Oral Daily  . thiamine  100 mg Oral Daily   Continuous Infusions: . dexmedetomidine (PRECEDEX) IV infusion 1.2 mcg/kg/hr (06/06/18 0741)   PRN Meds: acetaminophen **OR** [DISCONTINUED] acetaminophen, bisacodyl, fentaNYL (SUBLIMAZE) injection, gadobenate dimeglumine, hydrALAZINE, ipratropium-albuterol, lip balm, [DISCONTINUED] ondansetron **OR** ondansetron (ZOFRAN) IV, sennosides, sodium chloride flush   Vital Signs    Vitals:   06/06/18 0617 06/06/18 0700 06/06/18 0740 06/06/18 0800  BP: (!) 152/100 (!) 151/94  (!) 147/89  Pulse: (!) 53 (!) 53  (!) 50  Resp: (!) 23 20  19   Temp:   (!) 97.3 F (36.3 C)   TempSrc:   Axillary   SpO2: 97% 97%  96%  Weight:      Height:        Intake/Output Summary (Last 24 hours) at 06/06/2018 0840 Last data filed at 06/06/2018 1324 Gross per 24 hour  Intake 200 ml  Output 652 ml  Net -452 ml   Filed Weights   06/05/18 0948 06/05/18 1439 06/05/18 2130  Weight: 210 lb (95.3 kg) 222 lb 3.2 oz (100.8 kg) 220 lb 10.9 oz (100.1 kg)    Telemetry    NSR to sinus bradycardia with heart rates into the upper 40s to 70s bpm - Personally Reviewed  ECG    n/a - Personally  Reviewed  Physical Exam   GEN: No acute distress.   Neck: No JVD. Cardiac: RRR, no murmurs, rubs, or gallops.  Respiratory: Diminished breath sounds bilaterally.  GI: Soft, nontender, non-distended.   MS: No edema; No deformity. Multiple contusions and excoriations noted.  Neuro:  Sedated.  Psych: Sedated on Precedex.  Labs    Chemistry Recent Labs  Lab 06/02/18 2301 06/03/18 0413 06/04/18 0128 06/05/18 0246 06/06/18 0632  NA 143 146* 139 140 138  K 4.2 3.8 3.8 4.0 3.6  CL 116* 114* 107 106 108  CO2 20* 25 26 31 26   GLUCOSE 125* 125* 109* 106* 127*  BUN 29* 28* 21 18 17   CREATININE 1.52* 1.42* 1.15 1.09 0.95  CALCIUM 8.1* 8.1* 8.2* 8.4* 8.5*  PROT 5.7* 5.5*  --   --  6.2*  ALBUMIN 3.1* 3.0*  --   --  3.1*  AST 58* 63*  --   --  107*  ALT 24 26  --   --  53*  ALKPHOS 70 68  --   --  69  BILITOT 1.1 1.2  --   --  1.2  GFRNONAA 46* 50* >60 >60 >60  GFRAA 53* 58* >60 >60 >60  ANIONGAP 7 7 6  3* 4*  Hematology Recent Labs  Lab 06/03/18 0413 06/05/18 0246 06/06/18 0632  WBC 11.3* 8.7 8.6  RBC 3.91* 3.54* 3.65*  HGB 12.5* 11.4* 11.8*  HCT 36.9* 33.7* 34.5*  MCV 94.4 95.2 94.7  MCH 32.0 32.3 32.4  MCHC 33.9 33.9 34.2  RDW 13.5 13.1 13.1  PLT 175 121* 132*    Cardiac Enzymes Recent Labs  Lab 06/02/18 2301 06/03/18 0412 06/03/18 0956 06/04/18 0128  TROPONINI 2.26* 2.70* 2.60* 1.03*   No results for input(s): TROPIPOC in the last 168 hours.   BNPNo results for input(s): BNP, PROBNP in the last 168 hours.   DDimer No results for input(s): DDIMER in the last 168 hours.   Radiology    No results found.  Cardiac Studies   Echo (06/03/2018): Oddly dilated LV with low normal systolic function (EF 50 to 55%).  No obvious wall motion abnormality.  Normal diastolic function.  Moderately dilated a sending aorta, measuring 4.4 cm.  Mildly to moderately dilated left atrium.  Normal RV size and function.  Mild to moderate pulmonary  hypertension.   06/05/2018: Conclusions: 1. Severe native coronary artery disease with subtotal/total occlusions of the proximal/mid LAD, proximal LCx, and proximal RCA. 2. Widely patent LIMA to LAD, SVG to diagonal, and SVG to RPDA. 3. Patent but diffusely diseased SVG to OM3.  There are sequential focal 60% and 80% stenoses in the middle of the graft.  Intervention was not attempted due to the patient's inability to remain still on the table and consistently follow commands, as well as recent acute kidney injury in the setting of rhabdomyolysis.  Additionally, this graft supplies a relatively small territory.  Recommendations 1. Continue medical therapy and secondary prevention.  If platelets stabilize, recommend adding clopidogrel 75 mg daily to be continued at least 12 months. 2. If patient has signs/symptoms of ischemia or recurrent ventricular tachycardia, consider PCI to SVG to OM3 once mental status improves.  Recommend uninterrupted dual antiplatelet therapy with Aspirin 81mg  daily and Clopidogrel 75mg  daily for a minimum of 12 months (ACS - Class I recommendation).  Clopidogrel will be added once platelet count has stabilized.  Patient Profile     67 y.o. male with history of coronary artery disease status post CABG in 2008, abdominal aortic aneurysm status post open surgical repair in October 2008, hypertension hyperlipidemia who was brought by EMS over the weekend after possible out of hospital cardiac arrest. He was out in the sun and was found by his wife with multiple injuries suggestive of possible seizure activity. According to EMS, he was in ventricular tachycardia that required one shock.  Assessment & Plan    1. NSTEMI: -LHC 7/23 as above -Will plan to add Plavix with his ASA once his sedation is weaned and we have clearly indicated his platelet count is stable -Medical therapy with consideration for PCI to SVG-OM3 for recurrent symptoms of ischemia or VT -ASA 81 mg  daily -Lopressor as below -Crestor on hold as below -Cardiac cath site without active bleeding, pulse 2+  2. VT: -Continues to have intermittent PVCs on tele, no sustained arrhythmias -Marked QT prolongation on EKG -Avoid QT prolonging drugs -It is unclear why the patient has Lopressor 12.5 mg bid and Lopressor 25 mg bid. He has only received 25 mg  -Will discontinue the Lopressor 25 mg bid given he is mildly bradycardic (possibly 2/2 sedation) -Continue Lopressor 12.5 mg bid  3. HLD: -Crestor on hold in the setting of rhabdomyolysis  -LDL suboptimally controlled upon admission  at 124 -Anticipate resumption of escalated Crestor following improvement of rhabdo/acute illness  4. Possible heat stroke: -Unable to get MRI brain on 7/23 due to agitation -Consider MRI brain while on Precedex, defer to IM/PCCM -LHC as above  5. AKI with rhabdomyolysis: -Improving -Gentle hydration -Avoid nephrotoxic agents  6. Agitation: -On Precedex per PCCM  For questions or updates, please contact [place] Please consult [email or web address] for contact info under Cardiology/STEMI.    Signed, Christell Faith, PA-C [facility] Pager: [phone] 06/06/2018, 8:40 AM

## 2018-06-06 NOTE — Progress Notes (Signed)
   06/06/18 1110  Clinical Encounter Type  Visited With Patient  Visit Type Follow-up   Chaplain attempted visit, patient sleeping.  Chaplain offered silent and energetic prayers for patient, family, and care team.

## 2018-06-06 NOTE — Progress Notes (Signed)
pt developing severe agitation/delirium/impulsiveness with violent behavior after receiving IV ativan to try to complete an MRI. Safety sitter in room called out because pt was so impulsive and about to fall she could not handle pt by herself. This RN came into room, pt very agitated and combative towards staff and family members present in room. hospitalist called to try and get something to calm pt down since ativan had opposite effect on pt.  Pt unable to be directed so Code 300 was called. Hospitalitis still not returned page, so rapid response was called. IV haldol, IM geodon & IM fentanyl given finally with some relief, pt then transferred back to ICU room 13, to place on precedex gtt.

## 2018-06-07 ENCOUNTER — Telehealth: Payer: Self-pay | Admitting: Cardiovascular Disease

## 2018-06-07 LAB — CULTURE, BLOOD (ROUTINE X 2)
CULTURE: NO GROWTH
Culture: NO GROWTH
SPECIAL REQUESTS: ADEQUATE
Special Requests: ADEQUATE

## 2018-06-07 LAB — CBC WITH DIFFERENTIAL/PLATELET
Basophils Absolute: 0 10*3/uL (ref 0–0.1)
Basophils Relative: 0 %
EOS PCT: 4 %
Eosinophils Absolute: 0.3 10*3/uL (ref 0–0.7)
HEMATOCRIT: 34.1 % — AB (ref 40.0–52.0)
Hemoglobin: 11.7 g/dL — ABNORMAL LOW (ref 13.0–18.0)
LYMPHS ABS: 2.3 10*3/uL (ref 1.0–3.6)
LYMPHS PCT: 29 %
MCH: 32.5 pg (ref 26.0–34.0)
MCHC: 34.3 g/dL (ref 32.0–36.0)
MCV: 94.7 fL (ref 80.0–100.0)
MONO ABS: 0.8 10*3/uL (ref 0.2–1.0)
Monocytes Relative: 10 %
NEUTROS ABS: 4.5 10*3/uL (ref 1.4–6.5)
Neutrophils Relative %: 57 %
Platelets: 158 10*3/uL (ref 150–440)
RBC: 3.6 MIL/uL — ABNORMAL LOW (ref 4.40–5.90)
RDW: 13.1 % (ref 11.5–14.5)
WBC: 8 10*3/uL (ref 3.8–10.6)

## 2018-06-07 LAB — MAGNESIUM: MAGNESIUM: 1.9 mg/dL (ref 1.7–2.4)

## 2018-06-07 LAB — COMPREHENSIVE METABOLIC PANEL
ALBUMIN: 3 g/dL — AB (ref 3.5–5.0)
ALT: 50 U/L — AB (ref 0–44)
AST: 87 U/L — AB (ref 15–41)
Alkaline Phosphatase: 67 U/L (ref 38–126)
Anion gap: 8 (ref 5–15)
BUN: 16 mg/dL (ref 8–23)
CHLORIDE: 109 mmol/L (ref 98–111)
CO2: 25 mmol/L (ref 22–32)
CREATININE: 1.11 mg/dL (ref 0.61–1.24)
Calcium: 8.5 mg/dL — ABNORMAL LOW (ref 8.9–10.3)
GFR calc Af Amer: >60 mL/min (ref >60)
Glucose, Bld: 111 mg/dL — ABNORMAL HIGH (ref 70–99)
POTASSIUM: 3.6 mmol/L (ref 3.5–5.1)
Sodium: 142 mmol/L (ref 135–145)
Total Bilirubin: 1.2 mg/dL (ref 0.3–1.2)
Total Protein: 5.8 g/dL — ABNORMAL LOW (ref 6.5–8.1)

## 2018-06-07 LAB — T4, FREE: Free T4: 1.28 ng/dL (ref 0.82–1.77)

## 2018-06-07 LAB — CK: Total CK: 1635 U/L — ABNORMAL HIGH (ref 49–397)

## 2018-06-07 MED ORDER — AMIODARONE LOAD VIA INFUSION
150.0000 mg | Freq: Once | INTRAVENOUS | Status: AC
  Administered 2018-06-07: 150 mg via INTRAVENOUS
  Filled 2018-06-07: qty 83.34

## 2018-06-07 MED ORDER — NICOTINE 14 MG/24HR TD PT24: 14.0000 mg | MEDICATED_PATCH | Freq: Every day | TRANSDERMAL | 0 refills | Status: DC

## 2018-06-07 MED ORDER — QUETIAPINE FUMARATE 25 MG PO TABS
50.0000 mg | ORAL_TABLET | Freq: Every day | ORAL | Status: DC
  Administered 2018-06-07: 50 mg via ORAL
  Filled 2018-06-07: qty 2

## 2018-06-07 MED ORDER — CLOPIDOGREL BISULFATE 75 MG PO TABS: 75.0000 mg | ORAL_TABLET | Freq: Every day | ORAL | 11 refills | Status: DC

## 2018-06-07 MED ORDER — SILVER SULFADIAZINE 1 % EX CREA: TOPICAL_CREAM | Freq: Two times a day (BID) | CUTANEOUS | 0 refills | Status: DC

## 2018-06-07 MED ORDER — AMIODARONE HCL IN DEXTROSE 360-4.14 MG/200ML-% IV SOLN
60.0000 mg/h | INTRAVENOUS | Status: AC
  Administered 2018-06-07 (×2): 60 mg/h via INTRAVENOUS
  Filled 2018-06-07 (×2): qty 200

## 2018-06-07 MED ORDER — POTASSIUM CHLORIDE CRYS ER 20 MEQ PO TBCR
30.0000 meq | EXTENDED_RELEASE_TABLET | Freq: Once | ORAL | Status: AC
  Administered 2018-06-07: 30 meq via ORAL
  Filled 2018-06-07: qty 1

## 2018-06-07 MED ORDER — QUETIAPINE FUMARATE 25 MG PO TABS
50.0000 mg | ORAL_TABLET | Freq: Once | ORAL | Status: AC
  Administered 2018-06-07: 50 mg via ORAL
  Filled 2018-06-07: qty 2

## 2018-06-07 MED ORDER — APIXABAN 5 MG PO TABS
5.0000 mg | ORAL_TABLET | Freq: Two times a day (BID) | ORAL | Status: DC
  Administered 2018-06-07 – 2018-06-08 (×3): 5 mg via ORAL
  Filled 2018-06-07 (×3): qty 1

## 2018-06-07 MED ORDER — QUETIAPINE FUMARATE 100 MG PO TABS: 100.0000 mg | ORAL_TABLET | Freq: Every day | ORAL | Status: DC

## 2018-06-07 MED ORDER — BUPROPION HCL ER (SR) 100 MG PO TB12
200.0000 mg | ORAL_TABLET | Freq: Two times a day (BID) | ORAL | Status: DC
  Administered 2018-06-07 – 2018-06-08 (×3): 200 mg via ORAL
  Filled 2018-06-07 (×4): qty 2

## 2018-06-07 MED ORDER — QUETIAPINE FUMARATE 50 MG PO TABS: 50.0000 mg | ORAL_TABLET | Freq: Every evening | ORAL | 0 refills | Status: DC | PRN

## 2018-06-07 MED ORDER — AMIODARONE HCL IN DEXTROSE 360-4.14 MG/200ML-% IV SOLN
30.0000 mg/h | INTRAVENOUS | Status: DC
  Administered 2018-06-07 – 2018-06-08 (×2): 30 mg/h via INTRAVENOUS
  Filled 2018-06-07 (×2): qty 200

## 2018-06-07 MED ORDER — ROSUVASTATIN CALCIUM 20 MG PO TABS: 20.0000 mg | ORAL_TABLET | Freq: Every day | ORAL | 0 refills | Status: DC

## 2018-06-07 NOTE — Discharge Summary (Addendum)
[facility] [place] NAME: Stuart Harvey    MR#:  [id number]  DATE OF BIRTH:  10-24-1951  DATE OF ADMISSION:  06/02/2018 ADMITTING PHYSICIAN: Idelle Crouch, MD  DATE OF DISCHARGE: 06/08/2018  PRIMARY CARE PHYSICIAN: Kathrine Haddock, NP    ADMISSION DIAGNOSIS:  Sepsis, due to unspecified organism ([place]) [A41.9] Altered mental status, unspecified altered mental status type [R41.82]  DISCHARGE DIAGNOSIS:  Principal Problem:   Sepsis ([place]) Active Problems:   Chronic kidney disease   Acute encephalopathy   Hyperglycemia    Ventricular tachycardia ([place])   Demand ischemia ([place])   SECONDARY DIAGNOSIS:   Past Medical History:  Diagnosis Date  . AAA (abdominal aortic aneurysm) ([place])    s/p surgical repair in 08/2007 by Dr. Hulda Humphrey  . Anemia   . Arthritis    both knees  . Chronic kidney disease   . Coronary artery disease    CABG at [facility] in 06/2007 for 3 vessel CAD, LIMA to LAD, SVG to RPDA, SVG to OM2 and SVG to D2.  Cath in 2011 showed patent grafts. EF 40%  . Depression   . GERD (gastroesophageal reflux disease)   . Hernia of abdominal cavity   . History of heart attack    "silent heart attack"  . Hyperlipidemia   . Hypertension   . Hypothyroidism   . Myocardial infarction [facility])     HOSPITAL COURSE:  67 year old male who presented to the emergency room after being found on his porch by his wife.  1.  Acute respiratory failure, hypoxic due to confusion and agitation requiring temporary intubation: Patient was combative and agitated and was intubated for airway protection. Patient has been extubated and is doing well.   Sepsis has been ruled out.   2. Elevated troponin felt not to be NSTEMI as per CardiologyNSTEMI  with troponin max 2.7 and inferior EKG changes: Cardiac catheterization shows severe native CAD with chronic total occlusions of the LAD, proximal L circumflex and proximal RCA.  There is diffuse disease of SVG to  OM 2 graft as well  cardiology is recommending medical therapy. Continue aspirin and Plavix for 12 months. Continue metoprolol and low dose statin. Consider PCI to SVG to OM 2 with patient has signs and symptoms of ischemia. PCI deferred today given patient's inability to remain still during diagnostic catheterization and concern for vascular injury as per cardiology. LDL 124 Patient will follow up with cardiology and cardiac rehab.  3.  Acute encephalopathy requiring intubation for airway protection with negative head CT: Acute encephalopathy has improved.  This was thought to be due to heatstroke.  4.  Hypothyroidism: Continue Synthroid 5.  Depression: Continue Wellbutrin and citalopram. He has a prescription for as needed Seroquel at night if needed for anxiety and agitation.  6.  BPH: Continue Flomax.  7.  Syncope with collapse and mild rhabdo: CK has improved. Cardiology felt it was okay to start low-dose statin  8.  New onset atrial fibrillation: Patient had episode of atrial for ablation with RVR on July 25.  He was placed on amiodarone drip.  He converted to normal sinus rhythm on July 26 which is the day of discharge.  Initially plan was for cardioversion however since he converted to normal sinus rhythm we do not need to cardiovert him.  He will continue on amiodarone 200 twice daily and metoprolol.  He was also started on Eliquis.  Side effects, alternatives, risks and benefits were discussed with  the patient.  Patient will have outpatient follow-up with cardiology.  DISCHARGE CONDITIONS AND DIET:   Stable for discharge on heart healthy cardiac diet  CONSULTS OBTAINED:  Treatment Team:  Minna Merritts, MD  DRUG ALLERGIES:   Allergies  Allergen Reactions  . Benzodiazepines     Paradoxical agitation and delirium  . Codeine Nausea Only  . Tetracycline Rash    DISCHARGE MEDICATIONS:   Allergies as of 06/08/2018      Reactions   Benzodiazepines    Paradoxical  agitation and delirium   Codeine Nausea Only   Tetracycline Rash      Medication List    STOP taking these medications   lisinopril 5 MG tablet Commonly known as:  PRINIVIL,ZESTRIL     TAKE these medications   amiodarone 200 MG tablet Commonly known as:  PACERONE Take 1 tablet (200 mg total) by mouth 2 (two) times daily.   apixaban 5 MG Tabs tablet Commonly known as:  ELIQUIS Take 1 tablet (5 mg total) by mouth 2 (two) times daily.   aspirin 81 MG tablet Take 81 mg by mouth daily.   buPROPion 200 MG 12 hr tablet Commonly known as:  WELLBUTRIN SR Take 1 tablet (200 mg total) by mouth 2 (two) times daily.   citalopram 40 MG tablet Commonly known as:  CELEXA Take 1 tablet (40 mg total) by mouth daily.   D3 SUPER STRENGTH 2000 units Caps Generic drug:  Cholecalciferol Take 1 capsule by mouth daily.   levothyroxine 50 MCG tablet Commonly known as:  SYNTHROID, LEVOTHROID Take 1 tablet (50 mcg total) by mouth daily.   metoprolol tartrate 25 MG tablet Commonly known as:  LOPRESSOR Take 0.5 tablets (12.5 mg total) by mouth 2 (two) times daily.   nicotine 14 mg/24hr patch Commonly known as:  NICODERM CQ - dosed in mg/24 hours Place 1 patch (14 mg total) onto the skin daily.   omeprazole 20 MG capsule Commonly known as:  PRILOSEC Take 1 capsule (20 mg total) by mouth daily.   rosuvastatin 20 MG tablet Commonly known as:  CRESTOR Take 1 tablet (20 mg total) by mouth at bedtime.   silver sulfADIAZINE 1 % cream Commonly known as:  SILVADENE Apply topically 2 (two) times daily.   tamsulosin 0.4 MG Caps capsule Commonly known as:  FLOMAX Take 1 capsule (0.4 mg total) by mouth daily.         Today   CHIEF COMPLAINT:   Mental status has improved and patient is at baseline.   VITAL SIGNS:  Blood pressure (!) 126/93, pulse 80, temperature 97.9 F (36.6 C), temperature source Oral, resp. rate 17, height 6' (1.829 m), weight 220 lb 10.9 oz (100.1 kg), SpO2 98  %.   REVIEW OF SYSTEMS:  Review of Systems  Constitutional: Negative.  Negative for chills, fever and malaise/fatigue.  HENT: Negative.  Negative for ear discharge, ear pain, hearing loss, nosebleeds and sore throat.   Eyes: Negative.  Negative for blurred vision and pain.  Respiratory: Negative.  Negative for cough, hemoptysis, shortness of breath and wheezing.   Cardiovascular: Negative.  Negative for chest pain, palpitations and leg swelling.  Gastrointestinal: Negative.  Negative for abdominal pain, blood in stool, diarrhea, nausea and vomiting.  Genitourinary: Negative.  Negative for dysuria.  Musculoskeletal: Negative.  Negative for back pain.  Skin: Negative.   Neurological: Negative for dizziness, tremors, speech change, focal weakness, seizures and headaches.  Endo/Heme/Allergies: Negative.  Does not bruise/bleed easily.  Psychiatric/Behavioral: Negative  for depression, hallucinations and suicidal ideas. The patient is nervous/anxious.      PHYSICAL EXAMINATION:  GENERAL:  67 y.o.-year-old patient lying in the bed with no acute distress.  NECK:  Supple, no jugular venous distention. No thyroid enlargement, no tenderness.  LUNGS: Normal breath sounds bilaterally, no wheezing, rales,rhonchi  No use of accessory muscles of respiration.  CARDIOVASCULAR: S1, S2 normal. No murmurs, rubs, or gallops.  ABDOMEN: Soft, non-tender, non-distended. Bowel sounds present. No organomegaly or mass.  EXTREMITIES: No pedal edema, cyanosis, or clubbing.  PSYCHIATRIC: The patient is alert and oriented x 3.  SKIN: No obvious rash, lesion, or ulcer.   DATA REVIEW:   CBC Recent Labs  Lab 06/07/18 0511  WBC 8.0  HGB 11.7*  HCT 34.1*  PLT 158    Chemistries  Recent Labs  Lab 06/07/18 0511  NA 142  K 3.6  CL 109  CO2 25  GLUCOSE 111*  BUN 16  CREATININE 1.11  CALCIUM 8.5*  MG 1.9  AST 87*  ALT 50*  ALKPHOS 67  BILITOT 1.2    Cardiac Enzymes Recent Labs  Lab  06/03/18 0412 06/03/18 0956 06/04/18 0128  TROPONINI 2.70* 2.60* 1.03*    Microbiology Results  @MICRORSLT48 @  RADIOLOGY:  No results found.    Allergies as of 06/08/2018      Reactions   Benzodiazepines    Paradoxical agitation and delirium   Codeine Nausea Only   Tetracycline Rash      Medication List    STOP taking these medications   lisinopril 5 MG tablet Commonly known as:  PRINIVIL,ZESTRIL     TAKE these medications   amiodarone 200 MG tablet Commonly known as:  PACERONE Take 1 tablet (200 mg total) by mouth 2 (two) times daily.   apixaban 5 MG Tabs tablet Commonly known as:  ELIQUIS Take 1 tablet (5 mg total) by mouth 2 (two) times daily.   aspirin 81 MG tablet Take 81 mg by mouth daily.   buPROPion 200 MG 12 hr tablet Commonly known as:  WELLBUTRIN SR Take 1 tablet (200 mg total) by mouth 2 (two) times daily.   citalopram 40 MG tablet Commonly known as:  CELEXA Take 1 tablet (40 mg total) by mouth daily.   D3 SUPER STRENGTH 2000 units Caps Generic drug:  Cholecalciferol Take 1 capsule by mouth daily.   levothyroxine 50 MCG tablet Commonly known as:  SYNTHROID, LEVOTHROID Take 1 tablet (50 mcg total) by mouth daily.   metoprolol tartrate 25 MG tablet Commonly known as:  LOPRESSOR Take 0.5 tablets (12.5 mg total) by mouth 2 (two) times daily.   nicotine 14 mg/24hr patch Commonly known as:  NICODERM CQ - dosed in mg/24 hours Place 1 patch (14 mg total) onto the skin daily.   omeprazole 20 MG capsule Commonly known as:  PRILOSEC Take 1 capsule (20 mg total) by mouth daily.   rosuvastatin 20 MG tablet Commonly known as:  CRESTOR Take 1 tablet (20 mg total) by mouth at bedtime.   silver sulfADIAZINE 1 % cream Commonly known as:  SILVADENE Apply topically 2 (two) times daily.   tamsulosin 0.4 MG Caps capsule Commonly known as:  FLOMAX Take 1 capsule (0.4 mg total) by mouth daily.           Management plans discussed with the  patient and he is in agreement. Stable for discharge home  Patient should follow up with cardiology  CODE STATUS:     Code Status Orders  (  From admission, onward)        Start     Ordered   06/02/18 2033  Full code  Continuous     06/02/18 2032    Code Status History    Date Active Date Inactive Code Status Order ID Comments User Context   09/19/2016 1249 09/21/2016 1504 Full Code [id number]  Dereck Leep, MD Inpatient    Advance Directive Documentation     Most Recent Value  Type of Advance Directive  Healthcare Power of Attorney, Living will  Pre-existing out of facility DNR order (yellow form or pink MOST form)  -  "MOST" Form in Place?  -      TOTAL TIME TAKING CARE OF THIS PATIENT: 38 minutes.    Note: This dictation was prepared with Dragon dictation along with smaller phrase technology. Any transcriptional errors that result from this process are unintentional.  Milda Lindvall M.D on 06/08/2018 at 1:29 PM  Between 7am to 6pm - Pager - [phone] After 6pm go to [email or web address] - password [email or web address] [facility]  Office  [phone]  CC: Primary care physician; Kathrine Haddock, NP

## 2018-06-07 NOTE — Care Management (Signed)
Patient had transferred out of icu 7/23 to 2A then had to return to icu same day due to severe agitation. Transferred back to 2A today and  developed new atrial fib w RVR.  Started on Amiodarone drip

## 2018-06-07 NOTE — Telephone Encounter (Signed)
TCM....  Patient is being discharged later today  They saw Dr Fletcher Anon   They are scheduled to see Christell Faith on 06/26/18  They were seen for Elevated Troponin   They need to be seen within 1 week   Pt are on the wait list   Please call

## 2018-06-07 NOTE — Progress Notes (Signed)
CCMD notified this RN that patients HR in 120s- 150s Afib RVR, Ryan Dunn on floor and notified. MD Mody paged and aware. D/C cancelled.

## 2018-06-07 NOTE — Progress Notes (Signed)
Pt reports feeling restless, fidgeting in bed. Asked to get up to wheelchair so that he could move around. Explained to patient that we couldn't put him in a wheelchair and let him go around. Pt wanted to know if he could go to the door. Asked NT to walk patient around unit to help with restlessness.

## 2018-06-07 NOTE — Progress Notes (Signed)
Patient developed new onset Afib with RVR with ventricular rates into the 130s to 140s bpm at 12:26 PM today. This was preceded by two short runs of NSVT. He remains in Afib with RVR with ventricular rates in the 130s bpm currently. He is asymptomatic. His discharge has been cancelled. Case has been discussed with Dr. Rockey Situ. We will stop his ASA. He has been placed on IV amiodarone bolus and gtt in an effort to pharmacologically cardiovert (has been in Afib for < 48 hours, documented on telemetry). He has been started on Eliquis 5 mg bid (he does not meet reduced dosing criteria). I will make him NPO at midnight in case he does not pharmacologically cardiovert on amiodarone and requires a DCCV prior to discharge. CHADS2VASc 3 (HTN, age x 1, vascular disease). His potassium is being repleted to goal of > 4.0 I am checking another magnesium with recommendation to replete to goal > 2.0. Of note, the patient's TSH was elevated at time of admission. I will check free T4 and total T3.

## 2018-06-07 NOTE — Progress Notes (Signed)
Progress Note  Patient Name: Stuart Harvey Date of Encounter: 06/07/2018  Primary Cardiologist: Fletcher Anon  Subjective   He seems to be alert and oriented today.  No chest pain or shortness of breath.  Inpatient Medications    Scheduled Meds: . aspirin EC  81 mg Oral Daily  . citalopram  20 mg Oral Daily  . enoxaparin (LOVENOX) injection  40 mg Subcutaneous Q24H  . levothyroxine  75 mcg Oral BH-q7a  . nicotine  14 mg Transdermal Daily  . QUEtiapine  25 mg Oral BH-q7a  . QUEtiapine  50 mg Oral QHS  . silver sulfADIAZINE   Topical BID  . sodium chloride flush  3 mL Intravenous Q12H  . tamsulosin  0.4 mg Oral Daily  . thiamine  100 mg Oral Daily   Continuous Infusions:  PRN Meds: acetaminophen **OR** [DISCONTINUED] acetaminophen, bisacodyl, hydrALAZINE, ipratropium-albuterol, lip balm, [DISCONTINUED] ondansetron **OR** ondansetron (ZOFRAN) IV, sennosides, sodium chloride flush   Vital Signs    Vitals:   06/07/18 0720 06/07/18 0800 06/07/18 0900 06/07/18 1000  BP:  102/88 107/62 122/63  Pulse:  72 73 68  Resp:  17 (!) 22 (!) 23  Temp: 98.4 F (36.9 C)     TempSrc: Oral     SpO2:  97% 97% 99%  Weight:      Height:        Intake/Output Summary (Last 24 hours) at 06/07/2018 1211 Last data filed at 06/07/2018 0954 Gross per 24 hour  Intake 903.67 ml  Output 4825 ml  Net -3921.33 ml   Filed Weights   06/05/18 0948 06/05/18 1439 06/05/18 2130  Weight: 210 lb (95.3 kg) 222 lb 3.2 oz (100.8 kg) 220 lb 10.9 oz (100.1 kg)    Telemetry    NSR to sinus bradycardia with heart rates into the upper 40s to 70s bpm - Personally Reviewed  ECG    n/a - Personally Reviewed  Physical Exam   GEN: No acute distress.   Neck: No JVD. Cardiac: RRR, no murmurs, rubs, or gallops.  Respiratory: Diminished breath sounds bilaterally.  GI: Soft, nontender, non-distended.   MS: No edema; No deformity. Multiple contusions and excoriations noted.  Neuro:   Alert Psych: Alert and  oriented x3.  Labs    Chemistry Recent Labs  Lab 06/03/18 0413  06/05/18 0246 06/06/18 0632 06/07/18 0511  NA 146*   < > 140 138 142  K 3.8   < > 4.0 3.6 3.6  CL 114*   < > 106 108 109  CO2 25   < > 31 26 25   GLUCOSE 125*   < > 106* 127* 111*  BUN 28*   < > 18 17 16   CREATININE 1.42*   < > 1.09 0.95 1.11  CALCIUM 8.1*   < > 8.4* 8.5* 8.5*  PROT 5.5*  --   --  6.2* 5.8*  ALBUMIN 3.0*  --   --  3.1* 3.0*  AST 63*  --   --  107* 87*  ALT 26  --   --  53* 50*  ALKPHOS 68  --   --  69 67  BILITOT 1.2  --   --  1.2 1.2  GFRNONAA 50*   < > >60 >60 >60  GFRAA 58*   < > >60 >60 >60  ANIONGAP 7   < > 3* 4* 8   < > = values in this interval not displayed.     Hematology Recent Labs  Lab 06/05/18 0246 06/06/18 0632 06/07/18 0511  WBC 8.7 8.6 8.0  RBC 3.54* 3.65* 3.60*  HGB 11.4* 11.8* 11.7*  HCT 33.7* 34.5* 34.1*  MCV 95.2 94.7 94.7  MCH 32.3 32.4 32.5  MCHC 33.9 34.2 34.3  RDW 13.1 13.1 13.1  PLT 121* 132* 158    Cardiac Enzymes Recent Labs  Lab 06/02/18 2301 06/03/18 0412 06/03/18 0956 06/04/18 0128  TROPONINI 2.26* 2.70* 2.60* 1.03*   No results for input(s): TROPIPOC in the last 168 hours.   BNPNo results for input(s): BNP, PROBNP in the last 168 hours.   DDimer No results for input(s): DDIMER in the last 168 hours.   Radiology    No results found.  Cardiac Studies   Echo (06/03/2018): Oddly dilated LV with low normal systolic function (EF 50 to 55%).  No obvious wall motion abnormality.  Normal diastolic function.  Moderately dilated a sending aorta, measuring 4.4 cm.  Mildly to moderately dilated left atrium.  Normal RV size and function.  Mild to moderate pulmonary hypertension.   06/05/2018: Conclusions: 1. Severe native coronary artery disease with subtotal/total occlusions of the proximal/mid LAD, proximal LCx, and proximal RCA. 2. Widely patent LIMA to LAD, SVG to diagonal, and SVG to RPDA. 3. Patent but diffusely diseased SVG to OM3.  There  are sequential focal 60% and 80% stenoses in the middle of the graft.  Intervention was not attempted due to the patient's inability to remain still on the table and consistently follow commands, as well as recent acute kidney injury in the setting of rhabdomyolysis.  Additionally, this graft supplies a relatively small territory.  Recommendations 1. Continue medical therapy and secondary prevention.  If platelets stabilize, recommend adding clopidogrel 75 mg daily to be continued at least 12 months. 2. If patient has signs/symptoms of ischemia or recurrent ventricular tachycardia, consider PCI to SVG to OM3 once mental status improves.  Recommend uninterrupted dual antiplatelet therapy with Aspirin 81mg  daily and Clopidogrel 75mg  daily for a minimum of 12 months (ACS - Class I recommendation).  Clopidogrel will be added once platelet count has stabilized.  Patient Profile     67 y.o. male with history of coronary artery disease status post CABG in 2008, abdominal aortic aneurysm status post open surgical repair in October 2008, hypertension hyperlipidemia who was brought by EMS over the weekend after possible out of hospital cardiac arrest. He was out in the sun and was found by his wife with multiple injuries suggestive of possible seizure activity. According to EMS, he was in ventricular tachycardia that required one shock.  Assessment & Plan    1. NSTEMI: I personally reviewed his cardiac catheterization that was done by Dr. Saunders Revel.  All grafts were patent.  There was borderline disease and SVG to OM 3 after a valve.  This supplies a very small branch.  I do not recommend revascularization for this and I do not think this was the culprit.  I suspect that elevated troponin was likely due to supply demand ischemia.  I agree with adding Plavix and continue aspirin 81 mg once daily.  2. VT: No further arrhythmia.  Continue metoprolol 12.5 mg twice daily.  3. HLD: -Crestor on hold in the  setting of rhabdomyolysis  -  recommend resuming Crestor at 20 mg daily.    4. Possible heat stroke: -Unable to get MRI brain on 7/23 due to agitation His mental status appears to be back to baseline.  Resolved.  5. AKI with rhabdomyolysis: -Resolved.  No need to resume ACE inhibitor given that his blood pressure is normal.    For questions or updates, please contact [place] Please consult [email or web address] for contact info under Cardiology/STEMI.    Signed, [name], MD [facility] 06/07/2018, 12:11 PM

## 2018-06-08 DIAGNOSIS — I4891 Unspecified atrial fibrillation: Secondary | ICD-10-CM

## 2018-06-08 LAB — T3: T3, Total: 84 ng/dL (ref 71–180)

## 2018-06-08 SURGERY — CARDIOVERSION (CATH LAB)
Anesthesia: General

## 2018-06-08 MED ORDER — AMIODARONE HCL 200 MG PO TABS
200.0000 mg | ORAL_TABLET | Freq: Two times a day (BID) | ORAL | Status: DC
  Administered 2018-06-08: 200 mg via ORAL
  Filled 2018-06-08: qty 1

## 2018-06-08 MED ORDER — METOPROLOL TARTRATE 25 MG PO TABS
12.5000 mg | ORAL_TABLET | Freq: Two times a day (BID) | ORAL | Status: DC
  Administered 2018-06-08: 12.5 mg via ORAL
  Filled 2018-06-08: qty 1

## 2018-06-08 MED ORDER — AMIODARONE HCL 200 MG PO TABS: 200.0000 mg | ORAL_TABLET | Freq: Two times a day (BID) | ORAL | 0 refills | Status: DC

## 2018-06-08 MED ORDER — ROSUVASTATIN CALCIUM 10 MG PO TABS: 10.0000 mg | ORAL_TABLET | Freq: Every day | ORAL | Status: DC

## 2018-06-08 MED ORDER — APIXABAN 5 MG PO TABS: 5.0000 mg | ORAL_TABLET | Freq: Two times a day (BID) | ORAL | Status: DC

## 2018-06-08 NOTE — Plan of Care (Signed)
Fluctuating Afib/ST 117-139 this shift.  IV Amiodarone at maintenance rate.

## 2018-06-08 NOTE — Telephone Encounter (Signed)
S/w patient's wife, ok per DPR. Patient is still in the hospital.

## 2018-06-08 NOTE — Progress Notes (Signed)
SATURATION QUALIFICATIONS: (This note is used to comply with regulatory documentation for home oxygen)  Patient Saturations on Room Air at Rest = 99%  Patient Saturations on Room Air while Ambulating = 99%  Patient Saturations on n/a Liters of oxygen while Ambulating = n/a%  Please briefly explain why patient needs home oxygen:

## 2018-06-08 NOTE — Progress Notes (Signed)
[place] at [place] NAME: Stuart Harvey    MR#:  [id number]  DATE OF BIRTH:  October 10, 1951  SUBJECTIVE:  Plan for discharge yesterday however in the afternoon he went to atrial fibrillation with RVR which is new.  Plan for cardioversion today.  Currently on amiodarone drip.  Mental status is at baseline.   REVIEW OF SYSTEMS:    Review of Systems  Constitutional: Negative for chills, fever and malaise/fatigue.  HENT: Negative.  Negative for ear discharge, ear pain, hearing loss, nosebleeds and sore throat.   Eyes: Negative.  Negative for blurred vision and pain.  Respiratory: Negative.  Negative for cough, hemoptysis, shortness of breath and wheezing.   Cardiovascular: Negative.  Negative for chest pain, palpitations and leg swelling.  Gastrointestinal: Negative.  Negative for abdominal pain, blood in stool, diarrhea, nausea and vomiting.  Genitourinary: Negative.  Negative for dysuria.  Musculoskeletal: Negative.  Negative for back pain.  Skin: Negative.   Neurological: Negative for dizziness, tremors, speech change, focal weakness, seizures and headaches.  Endo/Heme/Allergies: Negative.  Does not bruise/bleed easily.  Psychiatric/Behavioral: Positive for memory loss. Negative for depression, hallucinations and suicidal ideas.       Tolerating Diet: Yes     DRUG ALLERGIES:   Allergies  Allergen Reactions  . Benzodiazepines     Paradoxical agitation and delirium  . Codeine Nausea Only  . Tetracycline Rash    VITALS:  Blood pressure (!) 126/98, pulse (!) 129, temperature 97.9 F (36.6 C), temperature source Oral, resp. rate 19, height 6' (1.829 m), weight 220 lb 10.9 oz (100.1 kg), SpO2 98 %.  PHYSICAL EXAMINATION:  Constitutional: Appears well-developed and well-nourished.  No distress. HENT: Normocephalic. .  Eyes: Conjunctivae are normal. no scleral icterus.  Neck: Normal ROM. Neck supple. No JVD. No tracheal  deviation. CVS: Tachycardia irregular irregular, no murmurs, no gallops, no carotid bruit.  Pulmonary: Effort and breath sounds normal, no stridor, rhonchi, wheezes, rales.  Abdominal: Soft. BS +,  no distension, tenderness, rebound or guarding.  Musculoskeletal: Normal range of motion. No edema and no tenderness.  Neuro: Alert with some mild confusion. Moves all extremities on ventilator.   No focal deficits. Skin: Skin is warm and dry. No rash noted.  Multiple bruises noted Psychiatric: Patient is awake x3  LABORATORY PANEL:   CBC Recent Labs  Lab 06/07/18 0511  WBC 8.0  HGB 11.7*  HCT 34.1*  PLT 158   ------------------------------------------------------------------------------------------------------------------  Chemistries  Recent Labs  Lab 06/07/18 0511  NA 142  K 3.6  CL 109  CO2 25  GLUCOSE 111*  BUN 16  CREATININE 1.11  CALCIUM 8.5*  MG 1.9  AST 87*  ALT 50*  ALKPHOS 67  BILITOT 1.2   ------------------------------------------------------------------------------------------------------------------  Cardiac Enzymes Recent Labs  Lab 06/03/18 0412 06/03/18 0956 06/04/18 0128  TROPONINI 2.70* 2.60* 1.03*   ------------------------------------------------------------------------------------------------------------------  RADIOLOGY:  No results found.   ASSESSMENT AND PLAN:   67 year old male who presented to the emergency room after being found on his porch by his wife.  1. Acute respiratory failure, hypoxic due to confusion and agitation requiring temporary intubation: Patient was combative and agitated and was intubated for airway protection. Patient has been extubated and is doing well.  Sepsis has been ruled out.   2. NSTEMI with troponin max 2.7 and inferior EKG changes: Cardiac catheterization shows severe native CAD with chronic total occlusions of the LAD, proximal L circumflex and proximal RCA. There is diffuse disease  of SVG to  OM 2 graft as well  Cardiologyis recommending medical therapy. Continue aspirinandPlavix for 12 months. Continue metoprolol and low dose statin. Consider PCI to SVG to OM 2 with patient has signs and symptoms of ischemia. PCI deferred today given patient's inability to remain still during diagnostic catheterization and concern for vascular injury as per cardiology. LDL 124 Patient will follow up with cardiology and cardiac rehab.  3. Acute encephalopathy requiring intubation for airway protection with negative head CT: Acute encephalopathy has improved.  This was thought to be due to heatstroke.  4. Hypothyroidism: Continue Synthroid 5. Depression: Continue Wellbutrin and citalopram. He has a prescription for as needed Seroquel at night if needed for anxiety and agitation.  6. BPH: Continue Flomax.  7. Syncope with collapse and mild rhabdo: CK has improved. Cardiology felt it was okay to start low-dose statin at discharge  8.  New onset atrial fibrillation with RVR: Plan for cardioversion today In the Eliquis   Management plans discussed with the patient and he is in agreement.  CODE STATUS: Full  TOTAL TIME TAKING CARE OF THIS PATIENT: 24 minutes.     POSSIBLE D/C to tomorrow DEPENDING ON CLINICAL CONDITION.   Jacoya Bauman M.D on 06/08/2018 at 12:07 PM  Between 7am to 6pm - Pager - [phone] After 6pm go to [email or web address] - password [email or web address] [facility]  Office  [phone]  CC: Primary care physician; Kathrine Haddock, NP  Note: This dictation was prepared with Dragon dictation along with smaller phrase technology. Any transcriptional errors that result from this process are unintentional.

## 2018-06-08 NOTE — Care Management Important Message (Signed)
Copy of signed IM left with patient in room.  

## 2018-06-08 NOTE — Progress Notes (Signed)
Progress Note  Patient Name: Stuart Harvey Date of Encounter: 06/08/2018  Primary Cardiologist: Fletcher Anon  Subjective   The patient was ready for discharge yesterday.  However, he developed atrial fibrillation with rapid ventricular response and the discharge was canceled.  He was placed on IV amiodarone with initiation of Eliquis for anticoagulation.  The patient continues to be in atrial fibrillation this morning with heart rate as high as 150 bpm.  He seems to have minimal symptoms.  No chest pain or shortness of breath.   Inpatient Medications    Scheduled Meds: . apixaban  5 mg Oral BID  . buPROPion  200 mg Oral BID  . citalopram  20 mg Oral Daily  . levothyroxine  75 mcg Oral BH-q7a  . nicotine  14 mg Transdermal Daily  . QUEtiapine  50 mg Oral QHS  . silver sulfADIAZINE   Topical BID  . sodium chloride flush  3 mL Intravenous Q12H  . tamsulosin  0.4 mg Oral Daily  . thiamine  100 mg Oral Daily   Continuous Infusions: . amiodarone 30 mg/hr (06/08/18 1129)   PRN Meds: acetaminophen **OR** [DISCONTINUED] acetaminophen, bisacodyl, hydrALAZINE, ipratropium-albuterol, lip balm, [DISCONTINUED] ondansetron **OR** ondansetron (ZOFRAN) IV, sennosides, sodium chloride flush   Vital Signs    Vitals:   06/08/18 0434 06/08/18 0501 06/08/18 0530 06/08/18 0745  BP: 119/83 (!) 144/94 (!) 126/98 (!) 126/98  Pulse: (!) 114 (!) 126 (!) 122 (!) 129  Resp:    19  Temp:    97.9 F (36.6 C)  TempSrc:    Oral  SpO2: 98% 94%  98%  Weight:      Height:        Intake/Output Summary (Last 24 hours) at 06/08/2018 1254 Last data filed at 06/08/2018 0644 Gross per 24 hour  Intake 474.13 ml  Output 1400 ml  Net -925.87 ml   Filed Weights   06/05/18 0948 06/05/18 1439 06/05/18 2130  Weight: 210 lb (95.3 kg) 222 lb 3.2 oz (100.8 kg) 220 lb 10.9 oz (100.1 kg)    Telemetry    Atrial fibrillation with rapid ventricular response- Personally Reviewed  ECG    n/a - Personally  Reviewed  Physical Exam   GEN: No acute distress.   Neck: No JVD. Cardiac:  Irregularly irregular and tachycardic, no murmurs, rubs, or gallops.  Respiratory: Diminished breath sounds bilaterally.  GI: Soft, nontender, non-distended.   MS: No edema; No deformity. Multiple contusions and excoriations noted.  Neuro:   Alert Psych: Alert and oriented x3.  Labs    Chemistry Recent Labs  Lab 06/03/18 0413  06/05/18 0246 06/06/18 0632 06/07/18 0511  NA 146*   < > 140 138 142  K 3.8   < > 4.0 3.6 3.6  CL 114*   < > 106 108 109  CO2 25   < > 31 26 25   GLUCOSE 125*   < > 106* 127* 111*  BUN 28*   < > 18 17 16   CREATININE 1.42*   < > 1.09 0.95 1.11  CALCIUM 8.1*   < > 8.4* 8.5* 8.5*  PROT 5.5*  --   --  6.2* 5.8*  ALBUMIN 3.0*  --   --  3.1* 3.0*  AST 63*  --   --  107* 87*  ALT 26  --   --  53* 50*  ALKPHOS 68  --   --  69 67  BILITOT 1.2  --   --  1.2  1.2  GFRNONAA 50*   < > >60 >60 >60  GFRAA 58*   < > >60 >60 >60  ANIONGAP 7   < > 3* 4* 8   < > = values in this interval not displayed.     Hematology Recent Labs  Lab 06/05/18 0246 06/06/18 0632 06/07/18 0511  WBC 8.7 8.6 8.0  RBC 3.54* 3.65* 3.60*  HGB 11.4* 11.8* 11.7*  HCT 33.7* 34.5* 34.1*  MCV 95.2 94.7 94.7  MCH 32.3 32.4 32.5  MCHC 33.9 34.2 34.3  RDW 13.1 13.1 13.1  PLT 121* 132* 158    Cardiac Enzymes Recent Labs  Lab 06/02/18 2301 06/03/18 0412 06/03/18 0956 06/04/18 0128  TROPONINI 2.26* 2.70* 2.60* 1.03*   No results for input(s): TROPIPOC in the last 168 hours.   BNPNo results for input(s): BNP, PROBNP in the last 168 hours.   DDimer No results for input(s): DDIMER in the last 168 hours.   Radiology    No results found.  Cardiac Studies   Echo (06/03/2018): Oddly dilated LV with low normal systolic function (EF 50 to 55%).  No obvious wall motion abnormality.  Normal diastolic function.  Moderately dilated a sending aorta, measuring 4.4 cm.  Mildly to moderately dilated left atrium.   Normal RV size and function.  Mild to moderate pulmonary hypertension.   06/05/2018: Conclusions: 1. Severe native coronary artery disease with subtotal/total occlusions of the proximal/mid LAD, proximal LCx, and proximal RCA. 2. Widely patent LIMA to LAD, SVG to diagonal, and SVG to RPDA. 3. Patent but diffusely diseased SVG to OM3.  There are sequential focal 60% and 80% stenoses in the middle of the graft.  Intervention was not attempted due to the patient's inability to remain still on the table and consistently follow commands, as well as recent acute kidney injury in the setting of rhabdomyolysis.  Additionally, this graft supplies a relatively small territory.  Recommendations 1. Continue medical therapy and secondary prevention.  If platelets stabilize, recommend adding clopidogrel 75 mg daily to be continued at least 12 months. 2. If patient has signs/symptoms of ischemia or recurrent ventricular tachycardia, consider PCI to SVG to OM3 once mental status improves.  Recommend uninterrupted dual antiplatelet therapy with Aspirin 81mg  daily and Clopidogrel 75mg  daily for a minimum of 12 months (ACS - Class I recommendation).  Clopidogrel will be added once platelet count has stabilized.  Patient Profile     67 y.o. male with history of coronary artery disease status post CABG in 2008, abdominal aortic aneurysm status post open surgical repair in October 2008, hypertension hyperlipidemia who was brought by EMS over the weekend after possible out of hospital cardiac arrest. He was out in the sun and was found by his wife with multiple injuries suggestive of possible seizure activity. According to EMS, he was in ventricular tachycardia that required one shock.  Assessment & Plan    1.  Newly diagnosed atrial fibrillation with rapid ventricular response: The onset is less than 24 hours.  This is a new diagnosis for the patient.  He continues to be significantly tachycardic.  Continue  amiodarone drip.  I think the best option is to proceed with cardioversion given that the duration is still less than 48 hours with no need to do a TEE.  I discussed the procedure in details as well as risks and benefits and will schedule the patient for today.   2. NSTEMI: I personally reviewed his cardiac catheterization that was done by Dr.  End.  All grafts were patent.  There was borderline disease and SVG to OM 3 after a valve.  This supplies a very small branch.  I do not recommend revascularization for this and I do not think this was the culprit.  I suspect that elevated troponin was likely due to supply demand ischemia.  3. VT: No further arrhythmia.  Continue metoprolol 12.5 mg twice daily.  4. HLD: -Crestor on hold in the setting of rhabdomyolysis  -  recommend resuming Crestor at 10 mg daily.    5. Possible heat stroke: -Unable to get MRI brain on 7/23 due to agitation His mental status appears to be back to baseline.  Resolved.  6. AKI with rhabdomyolysis: -Resolved.  No need to resume ACE inhibitor given that his blood pressure is normal.    For questions or updates, please contact [place] Please consult [email or web address] for contact info under Cardiology/STEMI.    Signed, [name], MD [facility] 06/08/2018, 12:54 PM

## 2018-06-08 NOTE — Progress Notes (Signed)
[place] [facility] [place] NAME: Stuart Harvey    MR#:  [id number]  DATE OF BIRTH:  1951-06-30  SUBJECTIVE:  Doing well this am REVIEW OF SYSTEMS:    Review of Systems  Constitutional: Negative for chills, fever and malaise/fatigue.  HENT: Negative.  Negative for ear discharge, ear pain, hearing loss, nosebleeds and sore throat.   Eyes: Negative.  Negative for blurred vision and pain.  Respiratory: Negative.  Negative for cough, hemoptysis, shortness of breath and wheezing.   Cardiovascular: Negative.  Negative for chest pain, palpitations and leg swelling.  Gastrointestinal: Negative.  Negative for abdominal pain, blood in stool, diarrhea, nausea and vomiting.  Genitourinary: Negative.  Negative for dysuria.  Musculoskeletal: Negative.  Negative for back pain.  Skin: Negative.   Neurological: Negative for dizziness, tremors, speech change, focal weakness, seizures and headaches.  Endo/Heme/Allergies: Negative.  Does not bruise/bleed easily.  Psychiatric/Behavioral: Positive for memory loss. Negative for depression, hallucinations and suicidal ideas.       Tolerating Diet: Yes     DRUG ALLERGIES:   Allergies  Allergen Reactions  . Benzodiazepines     Paradoxical agitation and delirium  . Codeine Nausea Only  . Tetracycline Rash    VITALS:  Blood pressure (!) 126/98, pulse (!) 129, temperature 97.9 F (36.6 C), temperature source Oral, resp. rate 19, height 6' (1.829 m), weight 220 lb 10.9 oz (100.1 kg), SpO2 98 %.  PHYSICAL EXAMINATION:  Constitutional: Appears well-developed and well-nourished.  No distress. HENT: Normocephalic. .  Eyes: Conjunctivae are normal. no scleral icterus.  Neck: Normal ROM. Neck supple. No JVD. No tracheal deviation. CVS: RRR, S1/S2 +, no murmurs, no gallops, no carotid bruit.  Pulmonary: Effort and breath sounds normal, no stridor, rhonchi, wheezes, rales.  Abdominal: Soft. BS +,  no distension,  tenderness, rebound or guarding.  Musculoskeletal: Normal range of motion. No edema and no tenderness.  Neuro: Alert with some mild confusion. Moves all extremities on ventilator.   No focal deficits. Skin: Skin is warm and dry. No rash noted.  Multiple bruises noted Psychiatric: Patient is awake with some mild confusion  LABORATORY PANEL:   CBC Recent Labs  Lab 06/07/18 0511  WBC 8.0  HGB 11.7*  HCT 34.1*  PLT 158   ------------------------------------------------------------------------------------------------------------------  Chemistries  Recent Labs  Lab 06/07/18 0511  NA 142  K 3.6  CL 109  CO2 25  GLUCOSE 111*  BUN 16  CREATININE 1.11  CALCIUM 8.5*  MG 1.9  AST 87*  ALT 50*  ALKPHOS 67  BILITOT 1.2   ------------------------------------------------------------------------------------------------------------------  Cardiac Enzymes Recent Labs  Lab 06/03/18 0412 06/03/18 0956 06/04/18 0128  TROPONINI 2.70* 2.60* 1.03*   ------------------------------------------------------------------------------------------------------------------  RADIOLOGY:  No results found.   ASSESSMENT AND PLAN:   67 year old male who presented to the emergency room after being found on his porch by his wife.  1. Acute respiratory failure, hypoxic due to confusion and agitation requiring temporary intubation: Patient was combative and agitated and was intubated for airway protection. Patient has been extubated and is doing well.  Sepsis has been ruled out.   2. NSTEMI with troponin max 2.7 and inferior EKG changes: Cardiac catheterization shows severe native CAD with chronic total occlusions of the LAD, proximal L circumflex and proximal RCA. There is diffuse disease of SVG to OM 2 graft as well  ardiologyis recommending medical therapy. Continue aspirinandPlavix for 12 months. Continue metoprolol and low dose statin. Consider PCI to SVG to  OM 2 with patient  has signs and symptoms of ischemia. PCI deferred today given patient's inability to remain still during diagnostic catheterization and concern for vascular injury as per cardiology. LDL 124 Patient will follow up with cardiology and cardiac rehab.  3. Acute encephalopathy requiring intubation for airway protection with negative head CT: Acute encephalopathy has improved.  This was thought to be due to heatstroke.  4. Hypothyroidism: Continue Synthroid 5. Depression: Continue Wellbutrin and citalopram. He has a prescription for as needed Seroquel at night if needed for anxiety and agitation.  6. BPH: Continue Flomax.  7. Syncope with collapse and mild rhabdo: CK has improved. Cardiology felt it was okay to start low-dose statin   Case discussed with cardiology  Management plans discussed with the patient and he is in agreement.  CODE STATUS: Full  TOTAL TIME TAKING CARE OF THIS PATIENT: 24 minutes.     POSSIBLE D/C to 2-4 days, DEPENDING ON CLINICAL CONDITION.   Mabry Tift M.D on 06/08/2018 at 12:05 PM  Between 7am to 6pm - Pager - [phone] After 6pm go to [email or web address] - password [email or web address] [facility]  Office  [phone]  CC: Primary care physician; Kathrine Haddock, NP  Note: This dictation was prepared with Dragon dictation along with smaller phrase technology. Any transcriptional errors that result from this process are unintentional.

## 2018-06-08 NOTE — Progress Notes (Signed)
Pt to [facility] for Cardioversion. Hooked pt to monitor for vital signs and pt appears to be in NSR. Confirmed with 12 lead ECG, called Dr Fletcher Anon who checked exported ECG. Procedure cancelled. [facility] notified. Pt to return to assigned room.

## 2018-06-08 NOTE — Progress Notes (Signed)
IVs and tele removed from patient. Discharge instructions given to patient along with hard copy prescriptions. Verbalized understanding. No acute distress at this time, wife is at bedside and will transport patient home.

## 2018-06-08 NOTE — Care Management Note (Signed)
Case Management Note  Patient Details  Name: Stuart Harvey MRN: [id number] Date of Birth: Oct 10, 1951  Subjective/Objective:                 Successful cardioversion. For discharge today.    Action/Plan:  Provided with Eliquis coupon for 30 day trial.  Referral to [facility]  Expected Discharge Date:  06/08/18               Expected Discharge Plan:     In-House Referral:     Discharge planning Services     Post Acute Care Choice:  Home Health Choice offered to:  Patient  DME Arranged:    DME Agency:     HH Arranged:  RN [place] Agency:  [facility]  Status of Service:  Completed, signed off  If discussed at [place] of Stay Meetings, dates discussed:    Additional Comments:  Katrina Stack, RN 06/08/2018, 3:31 PM

## 2018-06-11 ENCOUNTER — Telehealth: Payer: Self-pay | Admitting: Unknown Physician Specialty

## 2018-06-11 ENCOUNTER — Other Ambulatory Visit: Payer: Self-pay

## 2018-06-11 ENCOUNTER — Other Ambulatory Visit
Admission: RE | Admit: 2018-06-11 | Discharge: 2018-06-11 | Disposition: A | Discharge status: Home / Self Care | Payer: Medicare HMO | Source: Ambulatory Visit | Attending: Gastroenterology | Admitting: Gastroenterology

## 2018-06-11 DIAGNOSIS — D509 Iron deficiency anemia, unspecified: Secondary | ICD-10-CM

## 2018-06-11 DIAGNOSIS — K921 Melena: Secondary | ICD-10-CM | POA: Diagnosis not present

## 2018-06-11 LAB — CBC WITH DIFFERENTIAL/PLATELET
BASOS ABS: 0 10*3/uL (ref 0–0.1)
Basophils Relative: 1 %
EOS PCT: 6 %
Eosinophils Absolute: 0.5 10*3/uL (ref 0–0.7)
HEMATOCRIT: 36.5 % — AB (ref 40.0–52.0)
HEMOGLOBIN: 12.4 g/dL — AB (ref 13.0–18.0)
Lymphocytes Relative: 27 %
Lymphs Abs: 2.1 10*3/uL (ref 1.0–3.6)
MCH: 32.6 pg (ref 26.0–34.0)
MCHC: 34.1 g/dL (ref 32.0–36.0)
MCV: 95.6 fL (ref 80.0–100.0)
Monocytes Absolute: 0.7 10*3/uL (ref 0.2–1.0)
Monocytes Relative: 9 %
NEUTROS ABS: 4.4 10*3/uL (ref 1.4–6.5)
NEUTROS PCT: 57 %
PLATELETS: 265 10*3/uL (ref 150–440)
RBC: 3.82 MIL/uL — AB (ref 4.40–5.90)
RDW: 13.4 % (ref 11.5–14.5)
WBC: 7.7 10*3/uL (ref 3.8–10.6)

## 2018-06-11 LAB — IRON AND TIBC
Iron: 63 ug/dL (ref 45–182)
Saturation Ratios: 20 % (ref 17.9–39.5)
TIBC: 322 ug/dL (ref 250–450)
UIBC: 259 ug/dL

## 2018-06-11 LAB — FERRITIN: Ferritin: 65 ng/mL (ref 24–336)

## 2018-06-11 NOTE — Progress Notes (Signed)
Changed lab orders so pt can go to [facility] [facility]

## 2018-06-11 NOTE — Telephone Encounter (Signed)
Copied from [place] [id number]. Topic: Quick Communication - See Telephone Encounter >> Jun 11, 2018  9:03 AM Synthia Innocent wrote: CRM for notification. See Telephone encounter for: 06/11/18. Patient has declined [place] per [facility]

## 2018-06-11 NOTE — Telephone Encounter (Signed)
Patient answered but could not talk because he was getting out of the shower. He asked me to call him back at a later time.

## 2018-06-11 NOTE — Telephone Encounter (Signed)
Patient's wife answered, ok per DPR. She was contacted regarding discharge from [facility] on 06/08/18.   Patient's wife understands to follow up with provider ? On 06/26/18 at 3:30pm at [place].  Patient's wife understands discharge instructions? Yes  Patient's wife understands medications and regiment? Yes   Patient's wife understands to bring all medications to this visit? Yes

## 2018-06-11 NOTE — Telephone Encounter (Signed)
Patient returning call.

## 2018-06-12 ENCOUNTER — Telehealth: Payer: Self-pay | Admitting: Cardiovascular Disease

## 2018-06-12 ENCOUNTER — Telehealth: Payer: Self-pay

## 2018-06-12 ENCOUNTER — Other Ambulatory Visit: Payer: Self-pay | Admitting: *Deleted

## 2018-06-12 NOTE — Telephone Encounter (Signed)
Stuart Harvey from [place] States they need medication confirmation on prescriptions received when patient was discharged - regarding Plavix  They have tried to get in contact with Dr. Benjie Karvonen with no success, was told to contact Dr. Fletcher Anon Please call Stuart Harvey to discuss at [phone]

## 2018-06-12 NOTE — Telephone Encounter (Signed)
Pt called back.  Gave his results and recommendations from labs 06-11-18. He expressed understanding.

## 2018-06-12 NOTE — Telephone Encounter (Signed)
I spoke with Threasa Beards at the pharmacy. She states the patient brought RX's to them for eliquis 5 mg BID & plavix 75 mg once daily (2 paper RX's for plavix from Dr. Benjie Karvonen dated 7/25 & 7/26).  Threasa Beards states they filled the eliquis, but the plavix was not on his discharge list of meds so they have not filled- waiting on clarification from our office. I advised Threasa Beards that he appropriately will be on both eliquis and plavix, but according to the cath report from Dr. Saunders Revel, platelets would need to stabalize prior to starting this.  In reviewing the patient's chart he had a repeat CBC with Dr. Havery Moros yesterday and it appears platelets are stable. However, I have advised her not to fill plavix until I can review with Dr. Saunders Revel and get his ok to start plavix.   Threasa Beards is agreeable and voices understanding.

## 2018-06-12 NOTE — Telephone Encounter (Signed)
Recommendation to begin clopidogrel was made prior to development of atrial fibrillation.  Given that he is now on apixaban for his a-fib, I do not recommend ongoing clopidogrel therapy.  Given his elevated troponin and history of CAD, I recommend he remain on apixaban 5 mg twice daily and also start aspirin 81 mg daily.  He should follow-up in the office as previously directed.  Nelva Bush, MD [facility] Pager: [phone]

## 2018-06-13 ENCOUNTER — Telehealth: Payer: Self-pay | Admitting: *Deleted

## 2018-06-13 NOTE — Telephone Encounter (Signed)
Informed by Corene Cornea with [facility] that when agency attempted to schedule initial visit, wife declined the need for home health follow up.

## 2018-06-14 ENCOUNTER — Ambulatory Visit (INDEPENDENT_AMBULATORY_CARE_PROVIDER_SITE_OTHER): Payer: Medicare HMO | Admitting: Family Medicine

## 2018-06-14 ENCOUNTER — Encounter: Payer: Self-pay | Admitting: Family Medicine

## 2018-06-14 VITALS — BP 121/77 | HR 60 | Temp 98.7°F | Ht 71.0 in | Wt 202.5 lb

## 2018-06-14 DIAGNOSIS — T148XXA Other injury of unspecified body region, initial encounter: Secondary | ICD-10-CM | POA: Diagnosis not present

## 2018-06-14 DIAGNOSIS — G9340 Encephalopathy, unspecified: Secondary | ICD-10-CM

## 2018-06-14 DIAGNOSIS — I480 Paroxysmal atrial fibrillation: Secondary | ICD-10-CM | POA: Insufficient documentation

## 2018-06-14 DIAGNOSIS — I2510 Atherosclerotic heart disease of native coronary artery without angina pectoris: Secondary | ICD-10-CM

## 2018-06-14 DIAGNOSIS — I4891 Unspecified atrial fibrillation: Secondary | ICD-10-CM | POA: Diagnosis not present

## 2018-06-14 MED ORDER — ASPIRIN EC 81 MG PO TBEC: 81.0000 mg | DELAYED_RELEASE_TABLET | Freq: Every day | ORAL | Status: DC

## 2018-06-14 NOTE — Telephone Encounter (Signed)
I called [name] and left a message for Stuart Harvey that Dr. Saunders Revel is not going to have the patient start plavix. He will just take eliquis 5 mg BID & ASA 81 mg once daily  I have also called the patient and made him aware to take the eliquis 5 mg BID & ASA 81 mg once daily. He voices understanding.

## 2018-06-14 NOTE — Progress Notes (Signed)
BP 121/77   Pulse 60   Temp 98.7 F (37.1 C) (Oral)   Ht 5\' 11"  (1.803 m)   Wt 202 lb 8 oz (91.9 kg)   SpO2 97%   BMI 28.24 kg/m    Subjective:    Patient ID: Stuart Harvey, male    DOB: Apr 15, 1951, 67 y.o.   MRN: [id number]  HPI: Stuart Harvey is a 67 y.o. male  Chief Complaint  Patient presents with  . Hospitalization Follow-up    pt states he was diagnosed with a-fib   Pt here today for hospital f/u for altered mental status. Pt was found lying outside on the concrete with multiple abrasions on legs and feet after presumed syncopal episode. Neg CT head, and mental status improved with hydration. During admission, pt had elevated troponin of 2.7 and underwent cardiac catheterization which showed severe occlusion of LAD, proximal L circumflex, and proximal RCA. He also had a run of atrial fibrillation that was improved with initiation of amiodarone. Some confusion about whether he was to be on eliquis, plavix, or both - recent telephone encounter with Cardiology indicates eliquis only, which is what the pt is currently doing. Has upcoming Cardiology f/u.  Pt now feeling much better, back nearly to baseline. Does have some pain from wounds on LEs from concrete, which he's been dressing at home with neosporin and gauze. Denies fevers, palpitations, CP, SOB.   Past Medical History:  Diagnosis Date  . AAA (abdominal aortic aneurysm) ([place])    s/p surgical repair in 08/2007 by Dr. Hulda Humphrey  . Anemia   . Arthritis    both knees  . Chronic kidney disease   . Coronary artery disease    CABG at [facility] in 06/2007 for 3 vessel CAD, LIMA to LAD, SVG to RPDA, SVG to OM2 and SVG to D2.  Cath in 2011 showed patent grafts. EF 40%  . Depression   . GERD (gastroesophageal reflux disease)   . Hernia of abdominal cavity   . History of heart attack    "silent heart attack"  . Hyperlipidemia   . Hypertension   . Hypothyroidism   . Myocardial infarction [facility])     Relevant past medical,  surgical, family and social history reviewed and updated as indicated. Interim medical history since our last visit reviewed. Allergies and medications reviewed and updated.  Review of Systems  Per HPI unless specifically indicated above     Objective:    BP 121/77   Pulse 60   Temp 98.7 F (37.1 C) (Oral)   Ht 5\' 11"  (1.803 m)   Wt 202 lb 8 oz (91.9 kg)   SpO2 97%   BMI 28.24 kg/m   Wt Readings from Last 3 Encounters:  06/15/18 203 lb (92.1 kg)  06/14/18 202 lb 8 oz (91.9 kg)  06/05/18 220 lb 10.9 oz (100.1 kg)    Physical Exam  Constitutional: He is oriented to person, place, and time. He appears well-developed and well-nourished. No distress.  HENT:  Head: Atraumatic.  Eyes: Conjunctivae and EOM are normal.  Neck: Normal range of motion. Neck supple.  Cardiovascular: Normal rate and intact distal pulses.  Pulmonary/Chest: Effort normal and breath sounds normal.  Musculoskeletal: Normal range of motion.  Neurological: He is alert and oriented to person, place, and time.  Skin: Skin is warm and dry.  Multiple superficial abrasions b/l LEs. Deep ulcerations b/l medial feet, non-erythematous and not currently draining.   Psychiatric: He has a normal mood  and affect. His behavior is normal.  Nursing note and vitals reviewed.   Results for orders placed or performed in visit on 11/57/26  Basic Metabolic Panel (BMET)  Result Value Ref Range   Glucose 89 65 - 99 mg/dL   BUN 32 (H) 8 - 27 mg/dL   Creatinine, Ser 1.30 (H) 0.76 - 1.27 mg/dL   GFR calc non Af Amer 57 (L) >59 mL/min/1.73   GFR calc Af Amer 66 >59 mL/min/1.73   BUN/Creatinine Ratio 25 (H) 10 - 24   Sodium 140 134 - 144 mmol/L   Potassium 4.9 3.5 - 5.2 mmol/L   Chloride 103 96 - 106 mmol/L   CO2 21 20 - 29 mmol/L   Calcium 9.3 8.6 - 10.2 mg/dL      Assessment & Plan:   Problem List Items Addressed This Visit      Cardiovascular and Mediastinum   Coronary artery disease    Severe occlusions of  multiple arteries noted on cardiac catheterization during admission. Continue eliquis, crestor. Upcoming Cardiology f/u      Atrial fibrillation ([place])    On eliquis, metoprolol, amiodarone. Rate well controlled. Has upcoming Cardiology f/u. Continue current regimen        Nervous and Auditory   Acute encephalopathy - Primary    Improved with re-hydration. back at baseline since d/c.        Other Visit Diagnoses    Hypercalcemia       Recheck BMP   Relevant Orders   Basic Metabolic Panel (BMET) (Completed)   Abrasion       Non-infected currently. Irrigated with sterile saline, dressed with neosporin and nonstick gauze. Will monitor closely, recheck next week       Follow up plan: Return in about 6 days (around 06/20/2018) for wound check.

## 2018-06-15 ENCOUNTER — Ambulatory Visit (INDEPENDENT_AMBULATORY_CARE_PROVIDER_SITE_OTHER): Payer: Medicare HMO | Admitting: Nurse Practitioner

## 2018-06-15 ENCOUNTER — Encounter (INDEPENDENT_AMBULATORY_CARE_PROVIDER_SITE_OTHER): Payer: Self-pay | Admitting: Vascular Surgery

## 2018-06-15 ENCOUNTER — Ambulatory Visit (INDEPENDENT_AMBULATORY_CARE_PROVIDER_SITE_OTHER): Payer: Medicare HMO

## 2018-06-15 VITALS — BP 109/64 | HR 56 | Resp 15 | Ht 71.0 in | Wt 203.0 lb

## 2018-06-15 DIAGNOSIS — E785 Hyperlipidemia, unspecified: Secondary | ICD-10-CM | POA: Diagnosis not present

## 2018-06-15 DIAGNOSIS — I10 Essential (primary) hypertension: Secondary | ICD-10-CM | POA: Diagnosis not present

## 2018-06-15 DIAGNOSIS — I714 Abdominal aortic aneurysm, without rupture, unspecified: Secondary | ICD-10-CM

## 2018-06-15 LAB — BASIC METABOLIC PANEL
BUN/Creatinine Ratio: 25 — ABNORMAL HIGH (ref 10–24)
BUN: 32 mg/dL — AB (ref 8–27)
CALCIUM: 9.3 mg/dL (ref 8.6–10.2)
CO2: 21 mmol/L (ref 20–29)
Chloride: 103 mmol/L (ref 96–106)
Creatinine, Ser: 1.3 mg/dL — ABNORMAL HIGH (ref 0.76–1.27)
GFR, EST AFRICAN AMERICAN: 66 mL/min/{1.73_m2} (ref >59)
GFR, EST NON AFRICAN AMERICAN: 57 mL/min/{1.73_m2} — AB (ref >59)
Glucose: 89 mg/dL (ref 65–99)
Potassium: 4.9 mmol/L (ref 3.5–5.2)
Sodium: 140 mmol/L (ref 134–144)

## 2018-06-15 NOTE — Progress Notes (Signed)
Subjective:    Patient ID: Stuart Harvey, male    DOB: 1951/06/23, 67 y.o.   MRN: [id number] Chief Complaint  Patient presents with  . Follow-up    1 year EVAR f/u    HPI  The patient returns to the office for surveillance of an abdominal aortic aneurysm status post stent graft placement on 08/29/2007.  Patient underwent endoleak repair on 12/05/2012.  Patient reports having recently left the hospital due to having heatstroke.  Patient denies abdominal pain or back pain, no other abdominal complaints. No groin related complaints. No symptoms consistent with distal embolization No changes in claudication distance.    Patient denies amaurosis fugax or TIA symptoms. There is no history of claudication or rest pain symptoms of the lower extremities. The patient denies angina or shortness of breath.   Duplex US of the aorta and iliac arteries shows a 3.3 cm AAA sac with no evidence of endoleak, no change in the sac size compared to the previous study on 06/13/2017.  Constitutional: [] Weight loss  [] Fever  [] Chills Cardiac: [] Chest pain   [] Chest pressure   [] Palpitations   [] Shortness of breath when laying flat   [] Shortness of breath with exertion. Vascular:  [] Pain in legs with walking   [] Pain in legs with standing  [] History of DVT   [] Phlebitis   [] Swelling in legs   [x] Varicose veins   [] Non-healing ulcers Pulmonary:   [] Uses home oxygen   [] Productive cough   [] Hemoptysis   [] Wheeze  [] COPD   [] Asthma Neurologic:  [] Dizziness   [] Seizures   [] History of stroke   [] History of TIA  [] Aphasia   [] Vissual changes   [] Weakness or numbness in arm   [x] Weakness or numbness in leg Musculoskeletal:   [] Joint swelling   [] Joint pain   [] Low back pain Hematologic:  [] Easy bruising  [] Easy bleeding   [] Hypercoagulable state   [] Anemic Gastrointestinal:  [] Diarrhea   [] Vomiting  [] Gastroesophageal reflux/heartburn   [] Difficulty swallowing. Genitourinary:  [] Chronic kidney disease   [] Difficult  urination  [] Frequent urination   [] Blood in urine Skin:  [] Rashes   [] Ulcers  Psychological:  [] History of anxiety   []  History of major depression.     Objective:   Physical Exam  BP 109/64 (BP Location: Right Arm, Patient Position: Sitting)   Pulse (!) 56   Resp 15   Ht 5\' 11"  (1.803 m)   Wt 203 lb (92.1 kg)   BMI 28.31 kg/m   Past Medical History:  Diagnosis Date  . AAA (abdominal aortic aneurysm) ([place])    s/p surgical repair in 08/2007 by Dr. Hulda Humphrey  . Anemia   . Arthritis    both knees  . Chronic kidney disease   . Coronary artery disease    CABG at [facility] in 06/2007 for 3 vessel CAD, LIMA to LAD, SVG to RPDA, SVG to OM2 and SVG to D2.  Cath in 2011 showed patent grafts. EF 40%  . Depression   . GERD (gastroesophageal reflux disease)   . Hernia of abdominal cavity   . History of heart attack    "silent heart attack"  . Hyperlipidemia   . Hypertension   . Hypothyroidism   . Myocardial infarction ([place])      Gen: WD/WN, NAD Head: [place]/AT, No temporalis wasting.  Ear/Nose/Throat: Hearing grossly intact, nares w/o erythema or drainage, poor dentition Eyes: PER, EOMI, sclera nonicteric.  Neck: Supple, no masses.  No bruit or JVD.  Pulmonary:  Good air movement,  clear to auscultation bilaterally, no use of accessory muscles.  Cardiac: RRR, normal S1, S2, no Murmurs. Vascular:  Vessel Right Left  Radial 2+  2+  PT 2+ 2+  Gastrointestinal: soft, non-distended. No guarding/no peritoneal signs.  Musculoskeletal: M/S 5/5 throughout.  No deformity or atrophy.  Neurologic: CN 2-12 intact. Pain and light touch intact in extremities.  Symmetrical.  Speech is fluent. Motor exam as listed above. Psychiatric: Judgment intact, Mood & affect appropriate for pt's clinical situation. Dermatologic:Multiple scabs on legs, pt reports it was due to blisters from heat stroke. Lymph : No Cervical lymphadenopathy, no lichenification or skin changes of chronic lymphedema.   Social History     Socioeconomic History  . Marital status: Married    Spouse name: Not on file  . Number of children: Not on file  . Years of education: Not on file  . Highest education level: Not on file  Occupational History  . Occupation: retired  Scientific laboratory technician  . Financial resource strain: Not on file  . Food insecurity:    Worry: Not on file    Inability: Not on file  . Transportation needs:    Medical: Not on file    Non-medical: Not on file  Tobacco Use  . Smoking status: Former Smoker    Packs/day: 1.00    Years: 35.00    Pack years: 35.00    Types: Cigarettes    Last attempt to quit: 12/16/2005    Years since quitting: 12.5  . Smokeless tobacco: Current User    Types: Chew  . Tobacco comment: pt states he very rarely uses smokeless tobacco   Substance and Sexual Activity  . Alcohol use: No  . Drug use: No  . Sexual activity: Yes    Partners: Female  Lifestyle  . Physical activity:    Days per week: Not on file    Minutes per session: Not on file  . Stress: Not on file  Relationships  . Social connections:    Talks on phone: Not on file    Gets together: Not on file    Attends religious service: Not on file    Active member of club or organization: Not on file    Attends meetings of clubs or organizations: Not on file    Relationship status: Not on file  . Intimate partner violence:    Fear of current or ex partner: Not on file    Emotionally abused: Not on file    Physically abused: Not on file    Forced sexual activity: Not on file  Other Topics Concern  . Not on file  Social History Narrative  . Not on file    Past Surgical History:  Procedure Laterality Date  . ABDOMINAL AORTIC ANEURYSM REPAIR  08/2007  . ABDOMINAL AORTIC ANEURYSM REPAIR  11/2012   [facility]  . CARDIAC CATHETERIZATION  2008, 2011   [facility]   . CORONARY ARTERY BYPASS GRAFT  2008  . CORONARY/GRAFT ANGIOGRAPHY N/A 06/05/2018   Procedure: CORONARY/GRAFT ANGIOGRAPHY;  Surgeon: Nelva Bush, MD;   Location: [place] [facility];  Service: Cardiovascular;  Laterality: N/A;  . EXPLORATORY LAPAROTOMY  1997  . HERNIA REPAIR    . JOINT REPLACEMENT     right knee  . KNEE ARTHROPLASTY Left 09/19/2016   Procedure: COMPUTER ASSISTED TOTAL KNEE ARTHROPLASTY;  Surgeon: Dereck Leep, MD;  Location: [facility] ORS;  Service: Orthopedics;  Laterality: Left;  . TOTAL KNEE ARTHROPLASTY Right 09/2016    Family History  Problem Relation Age of Onset  . Heart disease Mother   . Heart attack Mother   . Hypertension Mother   . Heart attack Father   . Hypertension Father   . Kidney disease Brother   . Depression Maternal Grandmother   . Depression Maternal Grandfather   . Heart disease Unknown   . Heart attack Unknown   . Prostate cancer Neg Hx   . Kidney cancer Neg Hx   . Stomach cancer Neg Hx   . Colon cancer Neg Hx     Allergies  Allergen Reactions  . Benzodiazepines     Paradoxical agitation and delirium  . Codeine Nausea Only  . Tetracycline Rash       Assessment & Plan:   1. Abdominal aortic aneurysm (AAA) without rupture (HCC)-Stable  Recommend: Patient is status post successful endovascular repair of the AAA.   No further intervention is required at this time.   No endoleak is detected and the aneurysm sac is stable.  The patient will continue antiplatelet therapy as prescribed as well as aggressive management of hyperlipidemia. Exercise is again strongly encouraged.   However, endografts require continued surveillance with ultrasound or CT scan. This is mandatory to detect any changes that allow repressurization of the aneurysm sac.  The patient is informed that this would be asymptomatic.  The patient is reminded that lifelong routine surveillance is a necessity with an endograft. Patient will continue to follow-up at 12 month intervals with ultrasound of the aorta.   - VAS [place] AAA DUPLEX; Future  2. Hyperlipidemia, unspecified hyperlipidemia type-Stable Continue statin as  ordered and reviewed, no changes at this time   3. Essential hypertension-Stable  Continue antihypertensive medications as already ordered, these medications have been reviewed and there are no changes at this time.    Current Outpatient Medications on File Prior to Visit  Medication Sig Dispense Refill  . amiodarone (PACERONE) 200 MG tablet Take 1 tablet (200 mg total) by mouth 2 (two) times daily. 60 tablet 0  . apixaban (ELIQUIS) 5 MG TABS tablet Take 1 tablet (5 mg total) by mouth 2 (two) times daily. 60 tablet o  . aspirin EC 81 MG tablet Take 1 tablet (81 mg total) by mouth daily.    Marland Kitchen buPROPion (WELLBUTRIN SR) 200 MG 12 hr tablet Take 1 tablet (200 mg total) by mouth 2 (two) times daily. 180 tablet 3  . Cholecalciferol (D3 SUPER STRENGTH) 2000 UNITS CAPS Take 1 capsule by mouth daily.     . citalopram (CELEXA) 40 MG tablet Take 1 tablet (40 mg total) by mouth daily. 90 tablet 3  . levothyroxine (SYNTHROID, LEVOTHROID) 50 MCG tablet Take 1 tablet (50 mcg total) by mouth daily. 90 tablet 0  . metoprolol tartrate (LOPRESSOR) 25 MG tablet Take 0.5 tablets (12.5 mg total) by mouth 2 (two) times daily. 90 tablet 3  . nicotine (NICODERM CQ - DOSED IN MG/24 HOURS) 14 mg/24hr patch Place 1 patch (14 mg total) onto the skin daily. 28 patch 0  . omeprazole (PRILOSEC) 20 MG capsule Take 1 capsule (20 mg total) by mouth daily. 90 capsule 3  . rosuvastatin (CRESTOR) 20 MG tablet Take 1 tablet (20 mg total) by mouth at bedtime. 30 tablet 0  . silver sulfADIAZINE (SILVADENE) 1 % cream Apply topically 2 (two) times daily. 50 g 0  . tamsulosin (FLOMAX) 0.4 MG CAPS capsule Take 1 capsule (0.4 mg total) by mouth daily. 90 capsule 3   No current facility-administered medications  on file prior to visit.     There are no Patient Instructions on file for this visit. No follow-ups on file.   Kris Hartmann, NP

## 2018-06-17 NOTE — Assessment & Plan Note (Addendum)
On eliquis, metoprolol, amiodarone. Rate well controlled. Has upcoming Cardiology f/u. Continue current regimen

## 2018-06-17 NOTE — Assessment & Plan Note (Signed)
Severe occlusions of multiple arteries noted on cardiac catheterization during admission. Continue eliquis, crestor. Upcoming Cardiology f/u

## 2018-06-17 NOTE — Patient Instructions (Signed)
Follow up in 6 days for wound chck

## 2018-06-17 NOTE — Assessment & Plan Note (Signed)
Improved with re-hydration. back at baseline since d/c.

## 2018-06-22 ENCOUNTER — Encounter: Payer: Self-pay | Admitting: Family Medicine

## 2018-06-22 ENCOUNTER — Ambulatory Visit: Payer: Medicare HMO | Admitting: Family Medicine

## 2018-06-22 ENCOUNTER — Other Ambulatory Visit: Payer: Self-pay

## 2018-06-22 VITALS — BP 112/68 | HR 87 | Temp 98.2°F | Ht 72.0 in | Wt 202.4 lb

## 2018-06-22 DIAGNOSIS — T148XXA Other injury of unspecified body region, initial encounter: Secondary | ICD-10-CM

## 2018-06-22 MED ORDER — CEPHALEXIN 500 MG PO CAPS: 500.0000 mg | ORAL_CAPSULE | Freq: Two times a day (BID) | ORAL | 0 refills | Status: DC

## 2018-06-22 NOTE — Progress Notes (Signed)
   BP 112/68   Pulse 87   Temp 98.2 F (36.8 C) (Oral)   Ht 6' (1.829 m)   Wt 202 lb 6.4 oz (91.8 kg)   SpO2 97%   BMI 27.45 kg/m    Subjective:    Patient ID: Stuart Harvey, male    DOB: [birth year], 67 y.o.   MRN: [id number]  HPI: ERYC BODEY is a 67 y.o. male  Chief Complaint  Patient presents with  . Wound Check   Pt here today for wound check on multiple LE abrasions from recent fall/suspected seizure like activity. Most areas healing well without complication with basic wound care (neosporin and nonstick gauze. Concerned about right lateral ankle abrasion. Draining, red, difficult to dress. Denies fevers, chills, sweats, significant pain from area, decreased ROM.   Relevant past medical, surgical, family and social history reviewed and updated as indicated. Interim medical history since our last visit reviewed. Allergies and medications reviewed and updated.  Review of Systems  Per HPI unless specifically indicated above     Objective:    BP 112/68   Pulse 87   Temp 98.2 F (36.8 C) (Oral)   Ht 6' (1.829 m)   Wt 202 lb 6.4 oz (91.8 kg)   SpO2 97%   BMI 27.45 kg/m   Wt Readings from Last 3 Encounters:  06/22/18 202 lb 6.4 oz (91.8 kg)  06/15/18 203 lb (92.1 kg)  06/14/18 202 lb 8 oz (91.9 kg)    Physical Exam  Constitutional: He appears well-developed and well-nourished. No distress.  HENT:  Head: Atraumatic.  Eyes: Conjunctivae and EOM are normal.  Neck: Normal range of motion. Neck supple.  Cardiovascular: Normal rate and regular rhythm.  Pulmonary/Chest: Effort normal and breath sounds normal.  Musculoskeletal: Normal range of motion. He exhibits tenderness (mild ttp around right lateral ankle abrasion). He exhibits no edema.  Lymphadenopathy:    He has no cervical adenopathy.  Neurological: He is alert.  Skin: Skin is warm.  Multiple abrasions b/l LEs healing well, abrasion to right lateral ankle edematous, erythematous, with thick drainage  beginning to form  Psychiatric: He has a normal mood and affect. His behavior is normal.  Nursing note and vitals reviewed.   Results for orders placed or performed in visit on 38/46/65  Basic Metabolic Panel (BMET)  Result Value Ref Range   Glucose 89 65 - 99 mg/dL   BUN 32 (H) 8 - 27 mg/dL   Creatinine, Ser 1.30 (H) 0.76 - 1.27 mg/dL   GFR calc non Af Amer 57 (L) >59 mL/min/1.73   GFR calc Af Amer 66 >59 mL/min/1.73   BUN/Creatinine Ratio 25 (H) 10 - 24   Sodium 140 134 - 144 mmol/L   Potassium 4.9 3.5 - 5.2 mmol/L   Chloride 103 96 - 106 mmol/L   CO2 21 20 - 29 mmol/L   Calcium 9.3 8.6 - 10.2 mg/dL      Assessment & Plan:   Problem List Items Addressed This Visit    None    Visit Diagnoses    Abrasion    -  Primary   Area to right ankle becoming infected, start keflex and continue wound care as discussed. F/u for wound check, return precautions reviewed       Follow up plan: Return for wound check.

## 2018-06-25 NOTE — Patient Instructions (Signed)
Follow up for wound check 

## 2018-06-26 ENCOUNTER — Encounter

## 2018-06-26 ENCOUNTER — Telehealth: Payer: Self-pay | Admitting: Cardiovascular Disease

## 2018-06-26 ENCOUNTER — Ambulatory Visit: Payer: Medicare HMO | Admitting: Physician Assistant

## 2018-06-26 ENCOUNTER — Encounter: Payer: Self-pay | Admitting: Physician Assistant

## 2018-06-26 ENCOUNTER — Encounter: Payer: Self-pay | Admitting: *Deleted

## 2018-06-26 VITALS — BP 100/64 | HR 65 | Ht 72.0 in | Wt 203.5 lb

## 2018-06-26 DIAGNOSIS — I480 Paroxysmal atrial fibrillation: Secondary | ICD-10-CM

## 2018-06-26 DIAGNOSIS — I2510 Atherosclerotic heart disease of native coronary artery without angina pectoris: Secondary | ICD-10-CM

## 2018-06-26 DIAGNOSIS — I472 Ventricular tachycardia, unspecified: Secondary | ICD-10-CM

## 2018-06-26 DIAGNOSIS — E785 Hyperlipidemia, unspecified: Secondary | ICD-10-CM

## 2018-06-26 DIAGNOSIS — N182 Chronic kidney disease, stage 2 (mild): Secondary | ICD-10-CM

## 2018-06-26 MED ORDER — APIXABAN 5 MG PO TABS: 5.0000 mg | ORAL_TABLET | Freq: Two times a day (BID) | ORAL | 2 refills | Status: DC

## 2018-06-26 MED ORDER — ROSUVASTATIN CALCIUM 20 MG PO TABS: 20.0000 mg | ORAL_TABLET | Freq: Every day | ORAL | 3 refills | Status: DC

## 2018-06-26 NOTE — Telephone Encounter (Signed)
° °  [facility] Pre-operative Risk Assessment    Request for surgical clearance:  1. What type of surgery is being performed?  Dental extraction  2. When is this surgery scheduled? 8/13 at 10am   3. What type of clearance is required (medical clearance vs. Pharmacy clearance to hold med vs. Both)? Pharmacy clearance  4. Are there any medications that need to be held prior to surgery and how long? Eliquis 5 MG  5. Practice name and name of physician performing surgery? [facility], Dr. Gloris [place]  6. What is your office phone number [phone]   7.   What is your office fax number [phone]  8.   Anesthesia type (None, local, MAC, general) ?    Stuart Harvey 06/26/2018, 8:59 AM  _________________________________________________________________  [facility] calling Patient having extraction today and states they need clearance ASAP Office willing to take a verbal okay - will be faxing clearance later Please advise

## 2018-06-26 NOTE — Progress Notes (Signed)
Cardiology Office Note Date:  06/26/2018  Patient ID:  Stuart Harvey, Stuart Harvey 1951/06/24, MRN [id number] PCP:  Kathrine Haddock, NP  Cardiologist:  Dr. Fletcher Anon, MD    Chief Complaint: Hospital follow up  History of Present Illness: Stuart Harvey is a 67 y.o. male with history of CAD s/p CABG in 06/2007 at [facility], infrarenal AAA s/p stent graft placement on 08/29/2007 s/p endoleak repair 12/05/2012, recently diagnosed Afib on 06/07/2018 on Eliquis, CKD stage II, HTN, HLD, GERD, depression, and anemia who presents for hospital follow up after recent admission to [facility] from 7/20-7/26 for acute respiratory failure with hypoxia in the setting of VT, demand ischemia, rhabdomyolysis, acute encephalopathy, with possible heat stroke and new onset Afib with RVR.   Patient had an abnormal stress test in 2011 with follow up [place] in 2011 showing patent grafts with an EF of 40%. Stress test in 2013 showed prior evidence of inferolateral infarct with some peri-infarct ischemia and a normal EF. Compared to his stress test in 2011, this was essentially unchanged. Echo in 08/2015 showed an Ef of 50-55% with no significant valvular abnormalities. He was admitted to [facility] on 7/20 with being found unresponsive by family. He had reportedly been down for about 1 hour. He was frothing at that the mouth with possible seizure-like activity. There were numerous contusions and excoriations noted long his face and extremities. Per EMS he was in VT upon their arrival and cardioverted. He was intubated for airway protection. Troponin peaked at 2.70. CK peaked at 3999. Echo on 06/03/2018 showed an EF of 50-55%, grossly no significant WMA, normal LV diastolic function, moderately dilated ascending aorta at 44 mm, mildly to moderately dilated left atrial appendage, RVSF normal, PASP 44 mmHg. He underwent LHC on 06/05/2018 that showed severe native vessel CAD with subtotal/total occlusions of the proximal/mid LAD, proximal LCx, and proximal RCA.  Widely patent LIMA to LAD, SVG to OM3. There were sequential focal 60% and 80% stenoses in the middle of the graft. Intervention was not attempted due to the patient's inability to remain still on the table and consistently follow commands, as well as AKI in the setting of rhabdomyolysis. This region supplied a relatively small territory. Medical therapy was advised with consideration for PCI to the OM3 as above for recurrent symptoms or VT. He was unable to have an MRI of the brain due to agitation. He was noted to have new-onset Afib with RVR on 7/25 leading to a delay in his discharge. He was started on Eliquis 5 mg bid and his ASA was stopped. He was initially going to have a DCCV on 7/26 (had been in Afib for < 48 hours), though he spontaneously converted to sinus rhythm prior to this being completed. He was discharged on amiodarone 200 mg bid, Eliquis 5 mg bid, ASA 81 mg daily, Lopressor 12.5 mg bid, Crestor 20 mg daily, along with his non-cardiac medications. He has not been maintained on Plavix given his new onset Afib as above. Discharge weight of 220 pounds.   His AAA repair is followed by vascular surgery with most recent vascular ultrasound being stable, without endoleak.   Most recent labs show a stable to improved HGB of 12.4 from 7/29 and mild AKI with a SCr of 1.30 from 8/1, LDL 124 from 7/22.   He comes in doing well from a cardiac perspective.  He has not had any chest pain or worsening shortness of breath.  Indicates if he paces himself he is completely  asymptomatic.  He states if he walks at a fast pace and excessive heat he will be mildly short of breath though with no chest pain.  He has not had any further falls/syncopal episodes.  He now tries to avoid being outdoors in the heat as much as possible.  He is trying to maintain adequate hydration.  He is compliant with his medications.  He indicates he was told to discontinue aspirin and has only been taking Eliquis.  No BRBPR or melena.   Wound along the left foot is healing well.  No palpitations, dizziness, presyncope, or syncope.  No orthopnea, PND, lower extremity swelling, abdominal distention, or early satiety.  Past Medical History:  Diagnosis Date  . AAA (abdominal aortic aneurysm) ([place])    s/p surgical repair in 08/2007 by Dr. Hulda Humphrey  . Anemia   . Arthritis    both knees  . CKD (chronic kidney disease), stage II   . Coronary artery disease    a. 4-vCABG @ [place] 06/2007 (LIMA-LAD, VG-RPDA, VG-OM3,VG-D2; b. LHC 2011: patent grafts EF 40%, c. LHC 06/05/18: LM nl, pLAD 50%, p-mLAD 50%, ostD1 70%, ost ramus 30%, ost-pLCx 40%, pLCx 100% (chronically occluded), OM3 100%, p-dRCA 100% (chronically occluded), LIMA-LAD patent, VG-RPDA patent, VG-OM3 mid-graft 60% and 80%  . Depression   . GERD (gastroesophageal reflux disease)   . Hernia of abdominal cavity   . Hyperlipidemia   . Hypertension   . Hypothyroidism     Past Surgical History:  Procedure Laterality Date  . ABDOMINAL AORTIC ANEURYSM REPAIR  08/2007  . ABDOMINAL AORTIC ANEURYSM REPAIR  11/2012   [facility]  . CARDIAC CATHETERIZATION  2008, 2011   [facility]   . CORONARY ARTERY BYPASS GRAFT  2008  . CORONARY/GRAFT ANGIOGRAPHY N/A 06/05/2018   Procedure: CORONARY/GRAFT ANGIOGRAPHY;  Surgeon: Nelva Bush, MD;  Location: [facility];  Service: Cardiovascular;  Laterality: N/A;  . EXPLORATORY LAPAROTOMY  1997  . HERNIA REPAIR    . JOINT REPLACEMENT     right knee  . KNEE ARTHROPLASTY Left 09/19/2016   Procedure: COMPUTER ASSISTED TOTAL KNEE ARTHROPLASTY;  Surgeon: Dereck Leep, MD;  Location: [facility] ORS;  Service: Orthopedics;  Laterality: Left;  . TOTAL KNEE ARTHROPLASTY Right 09/2016    Current Meds  Medication Sig  . amiodarone (PACERONE) 200 MG tablet Take 1 tablet (200 mg total) by mouth 2 (two) times daily.  Marland Kitchen apixaban (ELIQUIS) 5 MG TABS tablet Take 1 tablet (5 mg total) by mouth 2 (two) times daily.  Marland Kitchen aspirin EC 81 MG tablet Take 1 tablet (81 mg  total) by mouth daily.  Marland Kitchen buPROPion (WELLBUTRIN SR) 200 MG 12 hr tablet Take 1 tablet (200 mg total) by mouth 2 (two) times daily.  . cephALEXin (KEFLEX) 500 MG capsule Take 1 capsule (500 mg total) by mouth 2 (two) times daily.  . Cholecalciferol (D3 SUPER STRENGTH) 2000 UNITS CAPS Take 1 capsule by mouth daily.   . citalopram (CELEXA) 40 MG tablet Take 1 tablet (40 mg total) by mouth daily.  Marland Kitchen levothyroxine (SYNTHROID, LEVOTHROID) 50 MCG tablet Take 1 tablet (50 mcg total) by mouth daily.  . metoprolol tartrate (LOPRESSOR) 25 MG tablet Take 0.5 tablets (12.5 mg total) by mouth 2 (two) times daily.  . nicotine (NICODERM CQ - DOSED IN MG/24 HOURS) 14 mg/24hr patch Place 1 patch (14 mg total) onto the skin daily.  Marland Kitchen omeprazole (PRILOSEC) 20 MG capsule Take 1 capsule (20 mg total) by mouth daily.  . rosuvastatin (CRESTOR)  20 MG tablet Take 1 tablet (20 mg total) by mouth at bedtime.  . silver sulfADIAZINE (SILVADENE) 1 % cream Apply topically 2 (two) times daily.  . tamsulosin (FLOMAX) 0.4 MG CAPS capsule Take 1 capsule (0.4 mg total) by mouth daily.    Allergies:   Benzodiazepines; Codeine; and Tetracycline   Social History:  The patient  reports that he quit smoking about 12 years ago. His smoking use included cigarettes. He has a 35.00 pack-year smoking history. His smokeless tobacco use includes chew. He reports that he does not drink alcohol or use drugs.   Family History:  The patient's family history includes Depression in his maternal grandfather and maternal grandmother; Heart attack in his father, mother, and unknown relative; Heart disease in his mother and unknown relative; Hypertension in his father and mother; Kidney disease in his brother.  ROS:   Review of Systems  Constitutional: Negative for chills, diaphoresis, fever, malaise/fatigue and weight loss.  HENT: Negative for congestion.   Eyes: Negative for discharge and redness.  Respiratory: Positive for shortness of breath.  Negative for cough, hemoptysis, sputum production and wheezing.        Short of breath if he walks at a fast pace and excessive heat  Cardiovascular: Negative for chest pain, palpitations, orthopnea, claudication, leg swelling and PND.  Gastrointestinal: Negative for abdominal pain, blood in stool, heartburn, melena, nausea and vomiting.  Genitourinary: Negative for hematuria.  Musculoskeletal: Negative for falls and myalgias.  Skin: Negative for rash.  Neurological: Negative for dizziness, tingling, tremors, sensory change, speech change, focal weakness, loss of consciousness and weakness.  Endo/Heme/Allergies: Does not bruise/bleed easily.  Psychiatric/Behavioral: Negative for substance abuse. The patient is not nervous/anxious.   All other systems reviewed and are negative.    PHYSICAL EXAM:  VS:  BP 100/64 (BP Location: Left Arm, Patient Position: Sitting, Cuff Size: Normal)   Pulse 65   Ht 6' (1.829 m)   Wt 203 lb 8 oz (92.3 kg)   BMI 27.60 kg/m  BMI: Body mass index is 27.6 kg/m.  Physical Exam  Constitutional: He is oriented to person, place, and time. He appears well-developed and well-nourished.  HENT:  Head: Normocephalic and atraumatic.  Eyes: Right eye exhibits no discharge. Left eye exhibits no discharge.  Neck: Normal range of motion. No JVD present.  Cardiovascular: Normal rate, regular rhythm, S1 normal, S2 normal and normal heart sounds. Exam reveals no distant heart sounds, no friction rub, no midsystolic click and no opening snap.  No murmur heard. Pulses:      Posterior tibial pulses are 2+ on the right side, and 2+ on the left side.  Pulmonary/Chest: Effort normal and breath sounds normal. No respiratory distress. He has no decreased breath sounds. He has no wheezes. He has no rales. He exhibits no tenderness.  Abdominal: Soft. He exhibits no distension. There is no tenderness.  Musculoskeletal: He exhibits no edema.  Bilateral varicose veins noted with a  well-healing bandaged wound along the left foot  Neurological: He is alert and oriented to person, place, and time.  Skin: Skin is warm and dry. No cyanosis. Nails show no clubbing.  Psychiatric: He has a normal mood and affect. His speech is normal and behavior is normal. Judgment and thought content normal.     EKG:  Was ordered and interpreted by me today. Shows NSR with sinus arrhythmia, 65 bpm, left anterior fascicular block, poor R wave progression, nonspecific intraventricular conduction delay, anterior lateral T wave inversion not  significantly changed from prior study  Recent Labs: 06/02/2018: TSH 6.598 06/07/2018: ALT 50; Magnesium 1.9 06/11/2018: Hemoglobin 12.4; Platelets 265 06/14/2018: BUN 32; Creatinine, Ser 1.30; Potassium 4.9; Sodium 140  06/04/2018: Cholesterol 180; HDL 42; LDL Cholesterol 124; Total CHOL/HDL Ratio 4.3; Triglycerides 69; VLDL 14   Estimated Creatinine Clearance: 61.4 mL/min (A) (by C-G formula based on SCr of 1.3 mg/dL (H)).   Wt Readings from Last 3 Encounters:  06/26/18 203 lb 8 oz (92.3 kg)  06/22/18 202 lb 6.4 oz (91.8 kg)  06/15/18 203 lb (92.1 kg)     Other studies reviewed: Additional studies/records reviewed today include: summarized above  ASSESSMENT AND PLAN:  1. CAD involving the native coronary arteries and bypass graft without angina: No symptoms concerning for chest pain/angina at this time.  Recent cardiac catheterization as detailed above.  Continue current medical therapy with Eliquis in place of aspirin, Lopressor, and Crestor.  Given the patient is asymptomatic and the affected vein graft supplies a relatively small myocardial territory we will pursue medical therapy at this time.  Should he redevelop symptoms concerning for angina this will need to be readdressed at that time.  2. PAF: Maintaining sinus rhythm with a heart rate in the 60s bpm.  Continue metoprolol 12.5 mg twice daily for rate control.  Discontinue amiodarone given he is  maintaining sinus rhythm and is only 67 years old.  Continue Eliquis 5 mg twice daily.  Patient indicates he is needing to have an isolated dental extraction.  He has been advised by EP to hold Eliquis for 48 hours prior to procedure and resume Eliquis when felt to be safe as per the dental team.  3. VT: No further documented arrhythmia.  Remains on metoprolol 12.5 mg twice daily.  Relative hypotension precludes escalation at this time.  Ischemic evaluation as above.  4. Hyperlipidemia: LDL checked upon admission noted to be 124.  Patient indicates he was on Crestor at this time.  Goal LDL less than 70.  We will recheck a lipid and liver function in approximately 8 weeks.  If LDL remains above goal at that time would escalate Crestor to 40 mg daily with possible addition of Zetia.  5. CKD stage II-III: Recent renal function at approximate baseline.  Recommend follow-up with PCP.  Disposition: F/u with Dr. Fletcher Anon or an APP in 3 months.  Current medicines are reviewed at length with the patient today.  The patient did not have any concerns regarding medicines.  Signed, Christell Faith, PA-C 06/26/2018 3:41 PM     [address], [place] [address] [phone]

## 2018-06-26 NOTE — Telephone Encounter (Signed)
Letter with Dr. Olin Pia recommendations faxed to Dr. Lorenda Hatchet office at [phone]. Confirmation received.

## 2018-06-26 NOTE — Telephone Encounter (Signed)
Reviewed with Dr. Caryl Comes- per MD- ok to hold eliquis for 48 hours prior to his extraction and resume at the descretion of the dentist.  I have called and spoken with Janett Billow at Dr. Lorenda Hatchet office. She is aware of MD recommendations.   She asked that I fax this to her in writing at [phone]. I advised her I will do this.

## 2018-06-26 NOTE — Patient Instructions (Signed)
Medication Instructions:  Your physician has recommended you make the following change in your medication:  1- STOP Amiodarone. 2- STOP Aspirin.   Labwork: Your physician recommends that you return for lab work in: [place] October 8TH, 2019  (LIPID, LIVER). AT [place]. YOU WILL NEED TO BE FASTING Please go to the [facility] You will check in at the front desk to the right as you walk into the atrium. Valet Parking is offered if needed.   Testing/Procedures: none  Follow-Up: Your physician recommends that you schedule a follow-up appointment in: [place] APP.   If you need a refill on your cardiac medications before your next appointment, please call your pharmacy.

## 2018-06-26 NOTE — Telephone Encounter (Signed)
I have looked over his chart and see that he is on Eliquis for Atrial fibrillation. He also has a history of NSTEMI and was recently admitted for repair of his AAA. Would like his cardiologist to approve whether or not he can hold his Eliquis at this point, thank you. This message has been forwarded to Dr. Fletcher Anon.   Copied from [place] [phone]. Topic: General - Other >> Jun 25, 2018  8:24 AM Keene Breath wrote: Reason for CRM: Patient called to speak with the nurse or doctor regarding his tooth that was broken over the weekend.  Patient would like to know if he can have the tooth pulled even though he is on apixaban (ELIQUIS) 5 MG TABS tablet medication.  He stated that the dentist would not pull the tooth until he gets an OK from the doctor.  Please advise.  CB# [phone]. >> Jun 25, 2018  2:49 PM Burchel, Abbi R wrote: Dr Gloris [place] Fax: [phone]

## 2018-07-05 ENCOUNTER — Ambulatory Visit: Payer: Self-pay

## 2018-07-12 ENCOUNTER — Ambulatory Visit (INDEPENDENT_AMBULATORY_CARE_PROVIDER_SITE_OTHER): Payer: Medicare HMO

## 2018-07-12 VITALS — BP 100/62 | HR 65 | Temp 98.5°F | Resp 16 | Ht 71.0 in | Wt 201.6 lb

## 2018-07-12 DIAGNOSIS — Z0000 Encounter for general adult medical examination without abnormal findings: Secondary | ICD-10-CM

## 2018-07-12 NOTE — Patient Instructions (Signed)
[name] , Thank you for taking time to come for your Medicare Wellness Visit. I appreciate your ongoing commitment to your health goals. Please review the following plan we discussed and let me know if I can assist you in the future.   Screening recommendations/referrals: Colonoscopy: completed 08/31/2017 Recommended yearly ophthalmology/optometry visit for glaucoma screening and checkup Recommended yearly dental visit for hygiene and checkup  Vaccinations: Influenza vaccine: due 07/2018 Pneumococcal vaccine: completed series Tdap vaccine: up to date Shingles vaccine: shingrix eligible, check with your insurance company for coverage     Advanced directives: Please bring a copy of your health care power of attorney and living will to the office at your convenience.  Conditions/risks identified: Recommend drinking at least 6-8 glasses of water a day   Next appointment: Follow up in 1 month for CPE with Jolene Cannady,NP. Follow up in one year for your annual wellness exam.   Preventive Care 65 Years and Older, Male Preventive care refers to lifestyle choices and visits with your health care provider that can promote health and wellness. What does preventive care include?  A yearly physical exam. This is also called an annual well check.  Dental exams once or twice a year.  Routine eye exams. Ask your health care provider how often you should have your eyes checked.  Personal lifestyle choices, including:  Daily care of your teeth and gums.  Regular physical activity.  Eating a healthy diet.  Avoiding tobacco and drug use.  Limiting alcohol use.  Practicing safe sex.  Taking low doses of aspirin every day.  Taking vitamin and mineral supplements as recommended by your health care provider. What happens during an annual well check? The services and screenings done by your health care provider during your annual well check will depend on your age, overall health, lifestyle  risk factors, and family history of disease. Counseling  Your health care provider may ask you questions about your:  Alcohol use.  Tobacco use.  Drug use.  Emotional well-being.  Home and relationship well-being.  Sexual activity.  Eating habits.  History of falls.  Memory and ability to understand (cognition).  Work and work Statistician. Screening  You may have the following tests or measurements:  Height, weight, and BMI.  Blood pressure.  Lipid and cholesterol levels. These may be checked every 5 years, or more frequently if you are over 36 years old.  Skin check.  Lung cancer screening. You may have this screening every year starting at age 24 if you have a 30-pack-year history of smoking and currently smoke or have quit within the past 15 years.  Fecal occult blood test (FOBT) of the stool. You may have this test every year starting at age 2.  Flexible sigmoidoscopy or colonoscopy. You may have a sigmoidoscopy every 5 years or a colonoscopy every 10 years starting at age 58.  Prostate cancer screening. Recommendations will vary depending on your family history and other risks.  Hepatitis C blood test.  Hepatitis B blood test.  Sexually transmitted disease (STD) testing.  Diabetes screening. This is done by checking your blood sugar (glucose) after you have not eaten for a while (fasting). You may have this done every 1-3 years.  Abdominal aortic aneurysm (AAA) screening. You may need this if you are a current or former smoker.  Osteoporosis. You may be screened starting at age 16 if you are at high risk. Talk with your health care provider about your test results, treatment options, and  if necessary, the need for more tests. Vaccines  Your health care provider may recommend certain vaccines, such as:  Influenza vaccine. This is recommended every year.  Tetanus, diphtheria, and acellular pertussis (Tdap, Td) vaccine. You may need a Td booster every 10  years.  Zoster vaccine. You may need this after age 52.  Pneumococcal 13-valent conjugate (PCV13) vaccine. One dose is recommended after age 82.  Pneumococcal polysaccharide (PPSV23) vaccine. One dose is recommended after age 40. Talk to your health care provider about which screenings and vaccines you need and how often you need them. This information is not intended to replace advice given to you by your health care provider. Make sure you discuss any questions you have with your health care provider. Document Released: 11/27/2015 Document Revised: 07/20/2016 Document Reviewed: 09/01/2015 Elsevier Interactive Patient Education  2017 [place] Prevention in the Home Falls can cause injuries. They can happen to people of all ages. There are many things you can do to make your home safe and to help prevent falls. What can I do on the outside of my home?  Regularly fix the edges of walkways and driveways and fix any cracks.  Remove anything that might make you trip as you walk through a door, such as a raised step or threshold.  Trim any bushes or trees on the path to your home.  Use bright outdoor lighting.  Clear any walking paths of anything that might make someone trip, such as rocks or tools.  Regularly check to see if handrails are loose or broken. Make sure that both sides of any steps have handrails.  Any raised decks and porches should have guardrails on the edges.  Have any leaves, snow, or ice cleared regularly.  Use sand or salt on walking paths during winter.  Clean up any spills in your garage right away. This includes oil or grease spills. What can I do in the bathroom?  Use night lights.  Install grab bars by the toilet and in the tub and shower. Do not use towel bars as grab bars.  Use non-skid mats or decals in the tub or shower.  If you need to sit down in the shower, use a plastic, non-slip stool.  Keep the floor dry. Clean up any water that  spills on the floor as soon as it happens.  Remove soap buildup in the tub or shower regularly.  Attach bath mats securely with double-sided non-slip rug tape.  Do not have throw rugs and other things on the floor that can make you trip. What can I do in the bedroom?  Use night lights.  Make sure that you have a light by your bed that is easy to reach.  Do not use any sheets or blankets that are too big for your bed. They should not hang down onto the floor.  Have a firm chair that has side arms. You can use this for support while you get dressed.  Do not have throw rugs and other things on the floor that can make you trip. What can I do in the kitchen?  Clean up any spills right away.  Avoid walking on wet floors.  Keep items that you use a lot in easy-to-reach places.  If you need to reach something above you, use a strong step stool that has a grab bar.  Keep electrical cords out of the way.  Do not use floor polish or wax that makes floors slippery. If you  must use wax, use non-skid floor wax.  Do not have throw rugs and other things on the floor that can make you trip. What can I do with my stairs?  Do not leave any items on the stairs.  Make sure that there are handrails on both sides of the stairs and use them. Fix handrails that are broken or loose. Make sure that handrails are as long as the stairways.  Check any carpeting to make sure that it is firmly attached to the stairs. Fix any carpet that is loose or worn.  Avoid having throw rugs at the top or bottom of the stairs. If you do have throw rugs, attach them to the floor with carpet tape.  Make sure that you have a light switch at the top of the stairs and the bottom of the stairs. If you do not have them, ask someone to add them for you. What else can I do to help prevent falls?  Wear shoes that:  Do not have high heels.  Have rubber bottoms.  Are comfortable and fit you well.  Are closed at the  toe. Do not wear sandals.  If you use a stepladder:  Make sure that it is fully opened. Do not climb a closed stepladder.  Make sure that both sides of the stepladder are locked into place.  Ask someone to hold it for you, if possible.  Clearly mark and make sure that you can see:  Any grab bars or handrails.  First and last steps.  Where the edge of each step is.  Use tools that help you move around (mobility aids) if they are needed. These include:  Canes.  Walkers.  Scooters.  Crutches.  Turn on the lights when you go into a dark area. Replace any light bulbs as soon as they burn out.  Set up your furniture so you have a clear path. Avoid moving your furniture around.  If any of your floors are uneven, fix them.  If there are any pets around you, be aware of where they are.  Review your medicines with your doctor. Some medicines can make you feel dizzy. This can increase your chance of falling. Ask your doctor what other things that you can do to help prevent falls. This information is not intended to replace advice given to you by your health care provider. Make sure you discuss any questions you have with your health care provider. Document Released: 08/27/2009 Document Revised: 04/07/2016 Document Reviewed: 12/05/2014 Elsevier Interactive Patient Education  2017 [employer].

## 2018-07-12 NOTE — Progress Notes (Signed)
Subjective:   Stuart Harvey is a 67 y.o. male who presents for Medicare Annual/Subsequent preventive examination.  Review of Systems:  Cardiac Risk Factors include: advanced age (>74men, >21 women);male gender;dyslipidemia;hypertension;smoking/ tobacco exposure     Objective:    Vitals: BP 100/62 (BP Location: Left Arm, Patient Position: Sitting)   Pulse 65   Temp 98.5 F (36.9 C) (Temporal)   Resp 16   Ht 5\' 11"  (1.803 m)   Wt 201 lb 9.6 oz (91.4 kg)   SpO2 96%   BMI 28.12 kg/m   Body mass index is 28.12 kg/m.  Advanced Directives 07/12/2018 06/03/2018 06/02/2018 07/05/2017 06/13/2017 01/02/2017 09/19/2016  Does Patient Have a Medical Advance Directive? Yes Yes No Yes Yes Yes Yes  Type of Paramedic of [place];Living will [place];Living will - [place];Living will - - Living will  Does patient want to make changes to medical advance directive? - No - Patient declined - - - - No - Patient declined  Copy of [place] in Chart? No - copy requested No - copy requested - No - copy requested - - No - copy requested  Would patient like information on creating a medical advance directive? - No - Patient declined No - Patient declined - - - -    Tobacco Social History   Tobacco Use  Smoking Status Former Smoker  . Packs/day: 1.00  . Years: 35.00  . Pack years: 35.00  . Types: Cigarettes  . Last attempt to quit: 12/16/2005  . Years since quitting: 12.5  Smokeless Tobacco Current User  . Types: Chew  Tobacco Comment   pt states he very rarely uses smokeless tobacco      Ready to quit: Yes Counseling given: Yes Comment: pt states he very rarely uses smokeless tobacco    Clinical Intake:  Pre-visit preparation completed: Yes  Pain : No/denies pain     Nutritional Status: BMI 25 -29 Overweight Nutritional Risks: None Diabetes: No  How often do you need to have someone help you when you  read instructions, pamphlets, or other written materials from your doctor or pharmacy?: 1 - Never What is the last grade level you completed in school?: some college   Interpreter Needed?: No  Information entered by :: Tiffany Hill,LPN   Past Medical History:  Diagnosis Date  . AAA (abdominal aortic aneurysm) ([place])    s/p surgical repair in 08/2007 by Dr. Hulda Humphrey  . Anemia   . Arthritis    both knees  . CKD (chronic kidney disease), stage II   . Coronary artery disease    a. 4-vCABG @ [facility] 06/2007 (LIMA-LAD, VG-RPDA, VG-OM3,VG-D2; b. LHC 2011: patent grafts EF 40%, c. LHC 06/05/18: LM nl, pLAD 50%, p-mLAD 50%, ostD1 70%, ost ramus 30%, ost-pLCx 40%, pLCx 100% (chronically occluded), OM3 100%, p-dRCA 100% (chronically occluded), LIMA-LAD patent, VG-RPDA patent, VG-OM3 mid-graft 60% and 80%  . Depression   . GERD (gastroesophageal reflux disease)   . Hernia of abdominal cavity   . Hyperlipidemia   . Hypertension   . Hypothyroidism    Past Surgical History:  Procedure Laterality Date  . ABDOMINAL AORTIC ANEURYSM REPAIR  08/2007  . ABDOMINAL AORTIC ANEURYSM REPAIR  11/2012   [facility]  . CARDIAC CATHETERIZATION  2008, 2011   [facility]   . CORONARY ARTERY BYPASS GRAFT  2008  . CORONARY/GRAFT ANGIOGRAPHY N/A 06/05/2018   Procedure: CORONARY/GRAFT ANGIOGRAPHY;  Surgeon: Nelva Bush, MD;  Location:  [facility];  Service: Cardiovascular;  Laterality: N/A;  . EXPLORATORY LAPAROTOMY  1997  . HERNIA REPAIR    . JOINT REPLACEMENT     right knee  . KNEE ARTHROPLASTY Left 09/19/2016   Procedure: COMPUTER ASSISTED TOTAL KNEE ARTHROPLASTY;  Surgeon: Dereck Leep, MD;  Location: [facility] ORS;  Service: Orthopedics;  Laterality: Left;  . TOTAL KNEE ARTHROPLASTY Right 09/2016   Family History  Problem Relation Age of Onset  . Heart disease Mother   . Heart attack Mother   . Hypertension Mother   . Heart attack Father   . Hypertension Father   . Kidney disease Brother   . Depression Maternal  Grandmother   . Depression Maternal Grandfather   . Heart disease Unknown   . Heart attack Unknown   . Prostate cancer Neg Hx   . Kidney cancer Neg Hx   . Stomach cancer Neg Hx   . Colon cancer Neg Hx    Social History   Socioeconomic History  . Marital status: Married    Spouse name: Not on file  . Number of children: Not on file  . Years of education: 51  . Highest education level: Some college, no degree  Occupational History  . Occupation: retired  Scientific laboratory technician  . Financial resource strain: Not hard at all  . Food insecurity:    Worry: Never true    Inability: Never true  . Transportation needs:    Medical: No    Non-medical: No  Tobacco Use  . Smoking status: Former Smoker    Packs/day: 1.00    Years: 35.00    Pack years: 35.00    Types: Cigarettes    Last attempt to quit: 12/16/2005    Years since quitting: 12.5  . Smokeless tobacco: Current User    Types: Chew  . Tobacco comment: pt states he very rarely uses smokeless tobacco   Substance and Sexual Activity  . Alcohol use: No  . Drug use: No  . Sexual activity: Yes    Partners: Female  Lifestyle  . Physical activity:    Days per week: 0 days    Minutes per session: 0 min  . Stress: Not at all  Relationships  . Social connections:    Talks on phone: More than three times a week    Gets together: More than three times a week    Attends religious service: More than 4 times per year    Active member of club or organization: No    Attends meetings of clubs or organizations: Never    Relationship status: Married  Other Topics Concern  . Not on file  Social History Narrative  . Not on file    Outpatient Encounter Medications as of 07/12/2018  Medication Sig  . apixaban (ELIQUIS) 5 MG TABS tablet Take 1 tablet (5 mg total) by mouth 2 (two) times daily.  Marland Kitchen buPROPion (WELLBUTRIN SR) 200 MG 12 hr tablet Take 1 tablet (200 mg total) by mouth 2 (two) times daily.  . Cholecalciferol (D3 SUPER STRENGTH) 2000  UNITS CAPS Take 1 capsule by mouth daily.   . citalopram (CELEXA) 40 MG tablet Take 1 tablet (40 mg total) by mouth daily.  Marland Kitchen levothyroxine (SYNTHROID, LEVOTHROID) 50 MCG tablet Take 1 tablet (50 mcg total) by mouth daily.  . metoprolol tartrate (LOPRESSOR) 25 MG tablet Take 0.5 tablets (12.5 mg total) by mouth 2 (two) times daily.  Marland Kitchen omeprazole (PRILOSEC) 20 MG capsule Take 1 capsule (  20 mg total) by mouth daily.  . rosuvastatin (CRESTOR) 20 MG tablet Take 1 tablet (20 mg total) by mouth at bedtime.  . tamsulosin (FLOMAX) 0.4 MG CAPS capsule Take 1 capsule (0.4 mg total) by mouth daily.  . cephALEXin (KEFLEX) 500 MG capsule Take 1 capsule (500 mg total) by mouth 2 (two) times daily. (Patient not taking: Reported on 07/12/2018)  . nicotine (NICODERM CQ - DOSED IN MG/24 HOURS) 14 mg/24hr patch Place 1 patch (14 mg total) onto the skin daily. (Patient not taking: Reported on 07/12/2018)  . silver sulfADIAZINE (SILVADENE) 1 % cream Apply topically 2 (two) times daily. (Patient not taking: Reported on 07/12/2018)   No facility-administered encounter medications on file as of 07/12/2018.     Activities of Daily Living In your present state of health, do you have any difficulty performing the following activities: 07/12/2018 06/03/2018  Hearing? N N  Vision? N N  Difficulty concentrating or making decisions? N N  Walking or climbing stairs? Y N  Comment a lot of stairs- SOB  -  Dressing or bathing? N N  Doing errands, shopping? N N  Preparing Food and eating ? N -  Using the Toilet? N -  In the past six months, have you accidently leaked urine? N -  Do you have problems with loss of bowel control? N -  Managing your Medications? N -  Managing your Finances? N -  Housekeeping or managing your Housekeeping? N -  Some recent data might be hidden    Patient Care Team: Kathrine Haddock, NP as PCP - General (Nurse Practitioner) Kathrine Haddock, NP as PCP - Family Medicine (Nurse Practitioner) Lucky Cowboy  Erskine Squibb, MD as Referring Physician (Vascular Surgery) Wellington Hampshire, MD as Consulting Physician (Cardiology)   Assessment:   This is a routine wellness examination for [facility].  Exercise Activities and Dietary recommendations Current Exercise Habits: The patient does not participate in regular exercise at present, Exercise limited by: None identified  Goals    . DIET - INCREASE WATER INTAKE     Recommend drinking at least 6-8 glasses of water a day     . Quit smoking / using tobacco     Tobacco cessation discussed       Fall Risk Fall Risk  07/12/2018 06/14/2018 07/05/2017 07/01/2016  Falls in the past year? Yes No No No  Number falls in past yr: 1 - - -  Injury with Fall? Yes - - -  Risk Factor Category  High Fall Risk - - -  Follow up Falls prevention discussed - - -   Is the patient's home free of loose throw rugs in walkways, pet beds, electrical cords, etc?   yes      Grab bars in the bathroom? no      Handrails on the stairs?   no      Adequate lighting?   yes  Timed Get Up and Go Performed: Completed in 8 seconds with no use of assistive devices, steady gait. No intervention needed at this time.   Depression Screen PHQ 2/9 Scores 07/12/2018 06/14/2018 07/05/2017 01/03/2017  PHQ - 2 Score 0 0 0 0  PHQ- 9 Score - 4 - -    Cognitive Function MMSE - Mini Mental State Exam 07/01/2016  Orientation to time 5  Orientation to Place 5  Registration 3  Attention/ Calculation 5  Recall 2  Language- name 2 objects 2  Language- repeat 1  Language- follow 3 step command  3  Language- read & follow direction 1  Write a sentence 1  Copy design 1  Total score 29     6CIT Screen 07/12/2018 07/05/2017  What Year? 0 points 0 points  What month? 0 points 0 points  What time? 0 points 0 points  Count back from 20 0 points 0 points  Months in reverse 0 points 0 points  Repeat phrase 0 points 0 points  Total Score 0 0    Immunization History  Administered Date(s) Administered    . Pneumococcal Conjugate-13 07/01/2016  . Pneumococcal Polysaccharide-23 10/07/2009, 07/07/2017  . Tdap 06/16/2015  . Zoster 06/16/2015    Qualifies for Shingles Vaccine?  Yes, discussed shingrix vaccine   Screening Tests Health Maintenance  Topic Date Due  . Hepatitis C Screening  1951-09-04  . INFLUENZA VACCINE  06/14/2018  . COLONOSCOPY  08/31/2020  . TETANUS/TDAP  06/15/2025  . PNA vac Low Risk Adult  Completed   Cancer Screenings: Lung: Low Dose CT Chest recommended if Age 11-80 years, 30 pack-year currently smoking OR have quit w/in 15years. Patient does qualify. Colorectal: completed 08/31/2017  Additional Screenings:  Hepatitis C Screening: due now, will order for future labs       Plan:    I have personally reviewed and addressed the Medicare Annual Wellness questionnaire and have noted the following in the patient's chart:  A. Medical and social history B. Use of alcohol, tobacco or illicit drugs  C. Current medications and supplements D. Functional ability and status E.  Nutritional status F.  Physical activity G. Advance directives H. List of other physicians I.  Hospitalizations, surgeries, and ER visits in previous 12 months J.  [place] such as hearing and vision if needed, cognitive and depression L. Referrals and appointments   In addition, I have reviewed and discussed with patient certain preventive protocols, quality metrics, and best practice recommendations. A written personalized care plan for preventive services as well as general preventive health recommendations were provided to patient.   Signed,  Tyler Aas, LPN Nurse Health Advisor   Nurse Notes:none

## 2018-07-24 ENCOUNTER — Telehealth: Payer: Self-pay | Admitting: Urology

## 2018-07-24 DIAGNOSIS — R972 Elevated prostate specific antigen [PSA]: Secondary | ICD-10-CM

## 2018-07-24 MED ORDER — TAMSULOSIN HCL 0.4 MG PO CAPS: 0.4000 mg | ORAL_CAPSULE | Freq: Every day | ORAL | 0 refills | Status: DC

## 2018-07-24 NOTE — Telephone Encounter (Signed)
Rx sent to pharmacy, pt informed.

## 2018-07-24 NOTE — Telephone Encounter (Signed)
Pt has upcoming appt w/[facility] ([facility]) He only has 4 pills left and would like a 1 month supply of Tamsulosin 0.4  Sent to [place] to last til his appt.  Please call pt and let him know when this is done.

## 2018-08-15 ENCOUNTER — Telehealth: Payer: Self-pay | Admitting: Urology

## 2018-08-15 DIAGNOSIS — R972 Elevated prostate specific antigen [PSA]: Secondary | ICD-10-CM

## 2018-08-15 NOTE — Telephone Encounter (Signed)
Pt needs more Tamsulosin, states he didn't have enough to get him to his upcoming appt. Please send Rx to [facility] in [place]. Pt has enough for almost a week, going out of town soon. Please advise. Thank you.

## 2018-08-16 MED ORDER — TAMSULOSIN HCL 0.4 MG PO CAPS: 0.4000 mg | ORAL_CAPSULE | Freq: Every day | ORAL | 6 refills | Status: DC

## 2018-08-16 NOTE — Telephone Encounter (Signed)
RX sent to pharmacy  

## 2018-08-16 NOTE — Addendum Note (Signed)
Addended by: Kyra Manges on: 08/16/2018 11:32 AM   Modules accepted: Orders

## 2018-08-19 IMAGING — DX DG KNEE 1-2V PORT*L*
2 series · 2 of 2 positions shown · non-contrast
Comparison: None.

CLINICAL DATA: Knee replacement surgery

EXAM:
PORTABLE LEFT KNEE - 1-2 VIEW

[knee ap]
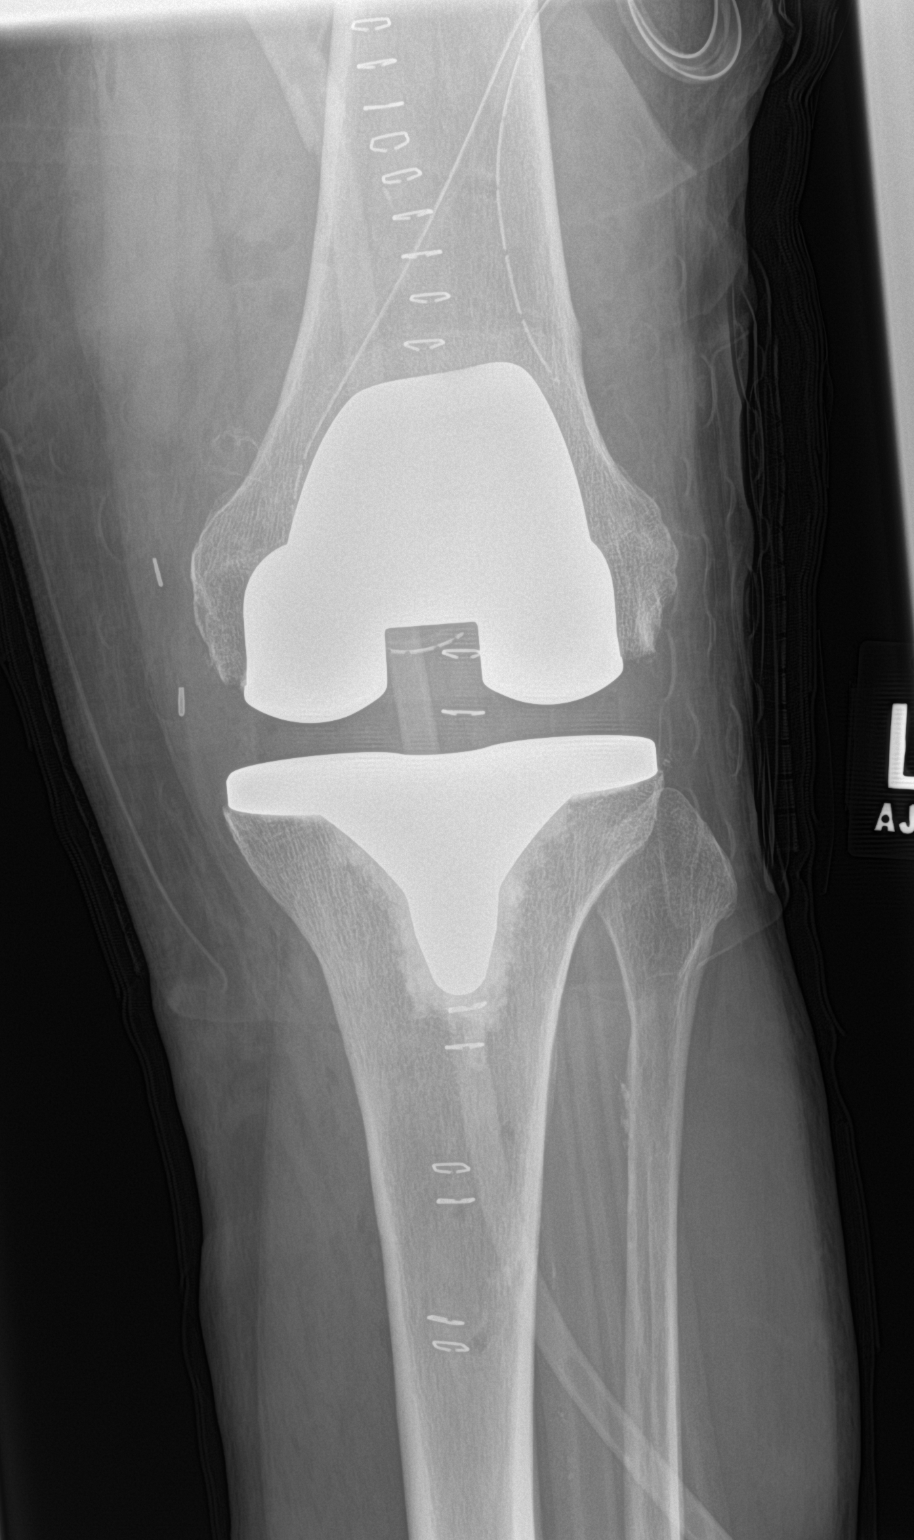

[knee lat]
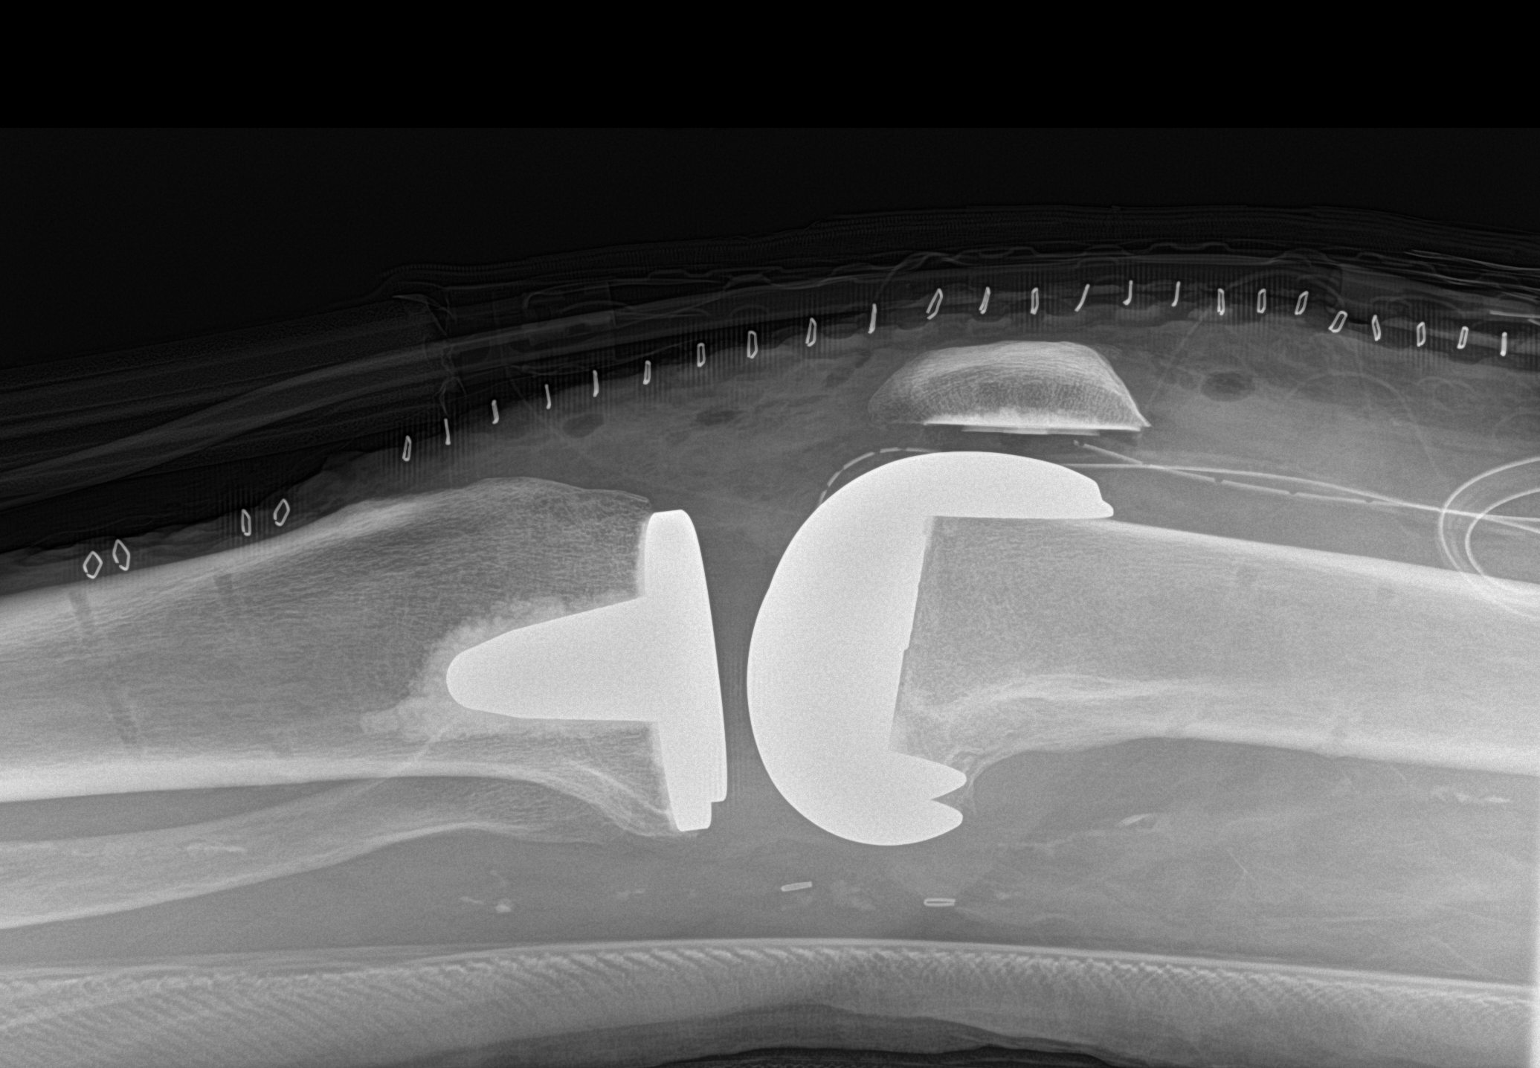

[2 of 2 positions shown; findings below may reference images not displayed]

FINDINGS: Three components of left knee arthroplasty project in expected
location. Anterior surgical drains and skin staples. No fracture or
dislocation.
IMPRESSION: Left knee arthroplasty without apparent complication.

## 2018-08-20 ENCOUNTER — Ambulatory Visit (INDEPENDENT_AMBULATORY_CARE_PROVIDER_SITE_OTHER): Payer: Medicare HMO | Admitting: Nurse Practitioner

## 2018-08-20 ENCOUNTER — Encounter: Payer: Self-pay | Admitting: Nurse Practitioner

## 2018-08-20 VITALS — BP 127/81 | HR 67 | Temp 98.6°F | Ht 72.0 in | Wt 208.8 lb

## 2018-08-20 DIAGNOSIS — I10 Essential (primary) hypertension: Secondary | ICD-10-CM | POA: Diagnosis not present

## 2018-08-20 DIAGNOSIS — D692 Other nonthrombocytopenic purpura: Secondary | ICD-10-CM

## 2018-08-20 DIAGNOSIS — Z1159 Encounter for screening for other viral diseases: Secondary | ICD-10-CM | POA: Diagnosis not present

## 2018-08-20 DIAGNOSIS — I480 Paroxysmal atrial fibrillation: Secondary | ICD-10-CM

## 2018-08-20 DIAGNOSIS — M791 Myalgia, unspecified site: Secondary | ICD-10-CM | POA: Diagnosis not present

## 2018-08-20 DIAGNOSIS — I8312 Varicose veins of left lower extremity with inflammation: Secondary | ICD-10-CM

## 2018-08-20 DIAGNOSIS — I8311 Varicose veins of right lower extremity with inflammation: Secondary | ICD-10-CM | POA: Diagnosis not present

## 2018-08-20 DIAGNOSIS — Z0000 Encounter for general adult medical examination without abnormal findings: Secondary | ICD-10-CM

## 2018-08-20 NOTE — Assessment & Plan Note (Signed)
Noted to bilateral upper extremity on pt, takes Eliquis daily.  Skin intact.  Continue to monitor for skin breakdown and encourage use of lotion daily to bilateral upper extremities.

## 2018-08-20 NOTE — Assessment & Plan Note (Signed)
Ordered TSH and Hep C antibody.  Pt reports he has lipid panel, BMP, and CBC being done in morning for cardiology appointment upcoming.  Return in one year for annual physical.  Six months to f/u on HTN.

## 2018-08-20 NOTE — Assessment & Plan Note (Signed)
No current symptoms or skin breakdown.  No current intervention.  If any worsening symptoms, return to clinic.

## 2018-08-20 NOTE — Patient Instructions (Signed)

## 2018-08-20 NOTE — Assessment & Plan Note (Signed)
Continue on Eliquis and Metoprolol.  No longer on Amiodarone.  Rate is well-controlled.  Sees cardiology in November.  Continue current medication regimen.

## 2018-08-20 NOTE — Progress Notes (Signed)
BP 127/81   Pulse (!) 55   Temp 98.6 F (37 C) (Oral)   Ht 6' (1.829 m)   Wt 208 lb 12.8 oz (94.7 kg)   SpO2 96%   BMI 28.32 kg/m    Subjective:    Patient ID: Stuart Harvey, male    DOB: 1951/07/17, 67 y.o.   MRN: [id number]  HPI: Stuart Harvey is a 67 y.o. male here for annual physical exam.  Recently had Medicare wellness exam.  Chief Complaint  Patient presents with  . Annual Exam    pt had wellness exam 07/12/18    HYPERTENSION Hypertension status: controlled  Satisfied with current treatment? yes Duration of hypertension: chronic BP monitoring frequency:  not checking BP range:  BP medication side effects:  no  Medication compliance: excellent compliance Previous BP meds: does not remember Aspirin: no, on Eliquis Recurrent headaches: no Visual changes: no Palpitations: no Dyspnea: no Chest pain: no Lower extremity edema: no Dizzy/lightheaded: no  ATRIAL FIBRILLATION Atrial fibrillation status: controlled Satisfied with current treatment: yes  Medication side effects:  no Medication compliance: excellent compliance Etiology of atrial fibrillation:  Palpitations:  no Chest pain:  no Dyspnea on exertion:  no Orthopnea:  no Syncope:  no Edema:  no Ventricular rate control: B-blocker Anti-coagulation: long acting, Eliquis Reports he was hospitalized in July after heat stroke, which affected his "heart".   He has been working out in the yard and admits to not hydrating well.  Discussed with patient importance of hydration when working outside and importance of taking frequent breaks/going inside when weather is hot.  Encouraged him to avoid strenuous exercise and outdoor work when weather is higher temps.  VARICOSE VEINS: No recent skin breakdown or injury to BLE.  Denies pain, numbness, or skin tear.    Senile Purpura Continues on Eliquis with skin intact.  No recent breakdown to BUE.  Encouraged to use lotion daily.  Relevant past medical,  surgical, family and social history reviewed and updated as indicated. Interim medical history since our last visit reviewed. Allergies and medications reviewed and updated.  Review of Systems  Constitutional: Negative for activity change, appetite change, fatigue and fever.  HENT: Negative.   Eyes: Negative.   Respiratory: Negative for cough, chest tightness, shortness of breath and wheezing.   Cardiovascular: Negative for chest pain.  Gastrointestinal: Negative for abdominal distention and abdominal pain.  Endocrine: Negative.   Genitourinary: Negative.   Musculoskeletal: Negative.   Skin: Negative.   Neurological: Negative for dizziness, tremors and headaches.  Psychiatric/Behavioral: Negative.     Per HPI unless specifically indicated above     Objective:    BP 127/81   Pulse (!) 55   Temp 98.6 F (37 C) (Oral)   Ht 6' (1.829 m)   Wt 208 lb 12.8 oz (94.7 kg)   SpO2 96%   BMI 28.32 kg/m   Wt Readings from Last 3 Encounters:  08/20/18 208 lb 12.8 oz (94.7 kg)  07/12/18 201 lb 9.6 oz (91.4 kg)  06/26/18 203 lb 8 oz (92.3 kg)    Physical Exam  Constitutional: He is oriented to person, place, and time. He appears well-developed and well-nourished.  HENT:  Head: Normocephalic and atraumatic.  Eyes: Pupils are equal, round, and reactive to light. EOM are normal.  Neck: Normal range of motion. Neck supple.  Cardiovascular: Normal rate, regular rhythm, normal heart sounds and intact distal pulses.  Pulmonary/Chest: Effort normal and breath sounds normal.  Abdominal: Soft.  Bowel sounds are normal.  Genitourinary: Penis normal.  Musculoskeletal: Normal range of motion.  Neurological: He is alert and oriented to person, place, and time.  Skin: Skin is warm and dry.  Psychiatric: He has a normal mood and affect. His behavior is normal.  Varicose veins present to bilateral lower extremities, skin intact. Strength BUE and BLE 5/5 with good ROM without pain reported or grimacing  noted.  Small bruising scattered, yellow/pale purple, bilateral upper extremity.  No edema to BLE noted.  Results for orders placed or performed in visit on 81/38/87  Basic Metabolic Panel (BMET)  Result Value Ref Range   Glucose 89 65 - 99 mg/dL   BUN 32 (H) 8 - 27 mg/dL   Creatinine, Ser 1.30 (H) 0.76 - 1.27 mg/dL   GFR calc non Af Amer 57 (L) >59 mL/min/1.73   GFR calc Af Amer 66 >59 mL/min/1.73   BUN/Creatinine Ratio 25 (H) 10 - 24   Sodium 140 134 - 144 mmol/L   Potassium 4.9 3.5 - 5.2 mmol/L   Chloride 103 96 - 106 mmol/L   CO2 21 20 - 29 mmol/L   Calcium 9.3 8.6 - 10.2 mg/dL      Assessment & Plan:   Problem List Items Addressed This Visit      Cardiovascular and Mediastinum   Hypertension    Stable, continue current medication regimen.      Varicose veins of both lower extremities with inflammation    No current symptoms or skin breakdown.  No current intervention.  If any worsening symptoms, return to clinic.      Atrial fibrillation (HCC)    Continue on Eliquis and Metoprolol.  No longer on Amiodarone.  Rate is well-controlled.  Sees cardiology in November.  Continue current medication regimen.      Senile purpura (HCC)    Noted to bilateral upper extremity on pt, takes Eliquis daily.  Skin intact.  Continue to monitor for skin breakdown and encourage use of lotion daily to bilateral upper extremities.        Other   Annual physical exam - Primary    Ordered TSH and Hep C antibody.  Pt reports he has lipid panel, BMP, and CBC being done in morning for cardiology appointment upcoming.  Return in one year for annual physical.  Six months to f/u on HTN.      Relevant Orders   Vitamin D (25 hydroxy)   Hepatitis C Antibody   TSH       Follow up plan: Return in about 6 months (around 02/19/2019), or if symptoms worsen or fail to improve.

## 2018-08-20 NOTE — Assessment & Plan Note (Signed)
Stable, continue current medication regimen 

## 2018-08-21 ENCOUNTER — Other Ambulatory Visit
Admission: RE | Admit: 2018-08-21 | Discharge: 2018-08-21 | Disposition: A | Discharge status: Home / Self Care | Payer: Medicare HMO | Source: Ambulatory Visit | Attending: Physician Assistant | Admitting: Physician Assistant

## 2018-08-21 ENCOUNTER — Telehealth: Payer: Self-pay | Admitting: *Deleted

## 2018-08-21 DIAGNOSIS — N182 Chronic kidney disease, stage 2 (mild): Secondary | ICD-10-CM | POA: Insufficient documentation

## 2018-08-21 DIAGNOSIS — I480 Paroxysmal atrial fibrillation: Secondary | ICD-10-CM | POA: Diagnosis not present

## 2018-08-21 DIAGNOSIS — E785 Hyperlipidemia, unspecified: Secondary | ICD-10-CM

## 2018-08-21 DIAGNOSIS — Z79899 Other long term (current) drug therapy: Secondary | ICD-10-CM

## 2018-08-21 LAB — HEPATIC FUNCTION PANEL
ALK PHOS: 85 U/L (ref 38–126)
ALT: 15 U/L (ref 0–44)
AST: 26 U/L (ref 15–41)
Albumin: 3.8 g/dL (ref 3.5–5.0)
BILIRUBIN TOTAL: 0.7 mg/dL (ref 0.3–1.2)
Bilirubin, Direct: <0.1 mg/dL (ref 0.0–0.2)
Total Protein: 7.1 g/dL (ref 6.5–8.1)

## 2018-08-21 LAB — TSH: TSH: 6.19 u[IU]/mL — ABNORMAL HIGH (ref 0.450–4.500)

## 2018-08-21 LAB — HEPATITIS C ANTIBODY

## 2018-08-21 LAB — LIPID PANEL
CHOLESTEROL: 165 mg/dL (ref 0–200)
HDL: 39 mg/dL — ABNORMAL LOW (ref >40)
LDL Cholesterol: 104 mg/dL — ABNORMAL HIGH (ref 0–99)
TRIGLYCERIDES: 109 mg/dL (ref <150)
Total CHOL/HDL Ratio: 4.2 RATIO
VLDL: 22 mg/dL (ref 0–40)

## 2018-08-21 LAB — VITAMIN D 25 HYDROXY (VIT D DEFICIENCY, FRACTURES): VIT D 25 HYDROXY: 64.9 ng/mL (ref 30.0–100.0)

## 2018-08-21 NOTE — Telephone Encounter (Signed)
-----   Message from Rise Mu, PA-C sent at 08/21/2018 11:07 AM EDT ----- Liver function is improved and now normal. LDL is improving, though not at goal of < 70. Increase Crestor to 40 mg daily. Recheck fasting lipid panel and liver function in 8 weeks. If he is not at goal at that time, we will plan to add Zetia 10 mg daily vs PCSK-9 inhibitor.

## 2018-08-22 MED ORDER — ROSUVASTATIN CALCIUM 40 MG PO TABS: 40.0000 mg | ORAL_TABLET | Freq: Every day | ORAL | 3 refills | Status: DC

## 2018-08-22 NOTE — Telephone Encounter (Signed)
Results called to pt. Pt verbalized understanding of results, to increase Crestor to 40 mg daily and to get repeat fasting lab work on or around December 4th at the [facility]. Rx sent to pharmacy. Lab orders entered.

## 2018-09-03 ENCOUNTER — Other Ambulatory Visit: Payer: Medicare HMO

## 2018-09-03 DIAGNOSIS — R972 Elevated prostate specific antigen [PSA]: Secondary | ICD-10-CM

## 2018-09-04 LAB — PSA: Prostate Specific Ag, Serum: 1.7 ng/mL (ref 0.0–4.0)

## 2018-09-05 ENCOUNTER — Ambulatory Visit
Admission: RE | Admit: 2018-09-05 | Discharge: 2018-09-05 | Disposition: A | Discharge status: Home / Self Care | Payer: Medicare HMO | Source: Ambulatory Visit | Attending: Urology | Admitting: Urology

## 2018-09-05 DIAGNOSIS — N281 Cyst of kidney, acquired: Secondary | ICD-10-CM | POA: Insufficient documentation

## 2018-09-05 DIAGNOSIS — N2889 Other specified disorders of kidney and ureter: Secondary | ICD-10-CM | POA: Diagnosis not present

## 2018-09-05 DIAGNOSIS — N183 Chronic kidney disease, stage 3 unspecified: Secondary | ICD-10-CM

## 2018-09-06 ENCOUNTER — Ambulatory Visit: Payer: Medicare HMO

## 2018-09-11 ENCOUNTER — Encounter: Payer: Self-pay | Admitting: Urology

## 2018-09-11 ENCOUNTER — Ambulatory Visit: Payer: Medicare HMO | Admitting: Urology

## 2018-09-11 ENCOUNTER — Other Ambulatory Visit: Payer: Self-pay

## 2018-09-11 VITALS — BP 107/69 | HR 65 | Ht 72.0 in | Wt 210.0 lb

## 2018-09-11 DIAGNOSIS — N183 Chronic kidney disease, stage 3 unspecified: Secondary | ICD-10-CM

## 2018-09-11 DIAGNOSIS — N2889 Other specified disorders of kidney and ureter: Secondary | ICD-10-CM

## 2018-09-11 DIAGNOSIS — R972 Elevated prostate specific antigen [PSA]: Secondary | ICD-10-CM

## 2018-09-11 LAB — MICROSCOPIC EXAMINATION
RBC MICROSCOPIC, UA: NONE SEEN /HPF (ref 0–2)
WBC, UA: NONE SEEN /hpf (ref 0–5)

## 2018-09-11 LAB — URINALYSIS, COMPLETE
Bilirubin, UA: NEGATIVE
GLUCOSE, UA: NEGATIVE
Ketones, UA: NEGATIVE
LEUKOCYTES UA: NEGATIVE
NITRITE UA: NEGATIVE
PROTEIN UA: NEGATIVE
RBC, UA: NEGATIVE
SPEC GRAV UA: 1.025 (ref 1.005–1.030)
Urobilinogen, Ur: 0.2 mg/dL (ref 0.2–1.0)
pH, UA: 5 (ref 5.0–7.5)

## 2018-09-11 LAB — BLADDER SCAN AMB NON-IMAGING: Scan Result: 18

## 2018-09-11 MED ORDER — TADALAFIL 5 MG PO TABS: 5.0000 mg | ORAL_TABLET | Freq: Every day | ORAL | 11 refills | Status: DC | PRN

## 2018-09-11 MED ORDER — TAMSULOSIN HCL 0.4 MG PO CAPS: 0.4000 mg | ORAL_CAPSULE | Freq: Every day | ORAL | 11 refills | Status: DC

## 2018-09-11 NOTE — Progress Notes (Signed)
   09/11/2018 2:11 PM   Stuart Harvey 1950-12-25 [id number]  Reason for visit: Follow up PSA screening, polycystic left kidney, and ED  HPI: I had the pleasure of seeing Stuart Harvey in urology clinic today in follow-up for the above.  He was previously followed by Dr. Tresa Moore.  He is a very co-morbid 67 year old male with extensive history of vascular disease.  He is able to climb 2 flights of stairs without any shortness of breath or chest pain.  First, he was followed for a polycystic left kidney measuring up to 9 cm in size.  This is been evaluated with MRI previously and showed no concerning findings for malignancy.  Ultrasound today is stable from prior.  He is asymptomatic and denies flank pain or hematuria.  He is also followed for PSA screening.  PSA today is 1.7 from 1.7 last year.  His urinary symptoms are very well controlled on Flomax.  He denies weak stream or urgency frequency.  Finally, today he reports that he has had significant difficulty with erections over the last few years.  He has never tried medications for this before.  He is not on any nitrates.   ROS: Please see flowsheet from today's date for complete review of systems.  Physical Exam: BP 107/69   Pulse 65   Ht 6' (1.829 m)   Wt 210 lb (95.3 kg)   BMI 28.48 kg/m    Constitutional:  Alert and oriented, No acute distress. Respiratory: Normal respiratory effort, no increased work of breathing. GI: Abdomen is soft, nontender, nondistended, no abdominal masses GU: No CVA tenderness DRE: Deferred Skin: No rashes, bruises or suspicious lesions. Neurologic: Grossly intact, no focal deficits, moving all 4 extremities. Psychiatric: Normal mood and affect  Laboratory Data: PSA 1.7 from 1.7 last year   Pertinent Imaging:  I have personally reviewed the renal ultrasound dated 09/06/2018: Polycystic left kidney without vascular elements or solid mass.  Stable from prior.  No hydronephrosis  bilaterally  Assessment & Plan:   In summary, Stuart Harvey is a 67 year old comorbid male with extensive vascular history here today for PSA screening, renal cyst surveillance, and erectile dysfunction.  His urinary symptoms are doing very well on Flomax.   1.  PSA screening Stable and remains low, continue screening to age 74, DRE next year  2. Renal mass Stable over 10 years, no concerning features on MRI, continue ultrasound surveillance, likely can discontinue if stable over the next few years  3.  ED Trial of Cialis 5 mg as needed.  Can increase dose up to 20 mg.  Good Rx coupon provided.  Side effects discussed.  4. LUTS Continue flomax, refilled prescription today   Return in about 1 year (around 09/12/2019) for PSA, renal U/S.  [place], [facility] [address] [place], [place] [address] [phone]

## 2018-09-15 ENCOUNTER — Other Ambulatory Visit: Payer: Self-pay | Admitting: Unknown Physician Specialty

## 2018-09-17 NOTE — Telephone Encounter (Signed)
Requested medication (s) are due for refill today: Yes  Requested medication (s) are on the active medication list: Yes  Last refill:  03/28/18  Future visit scheduled: Yes  Notes to clinic:  TSH result not addressed at last lab on 08/20/18, unsure if will stay on same dosage, routed to provider for review, no TSH ordered for 3 months per protocol.     Requested Prescriptions  Pending Prescriptions Disp Refills   levothyroxine (SYNTHROID, LEVOTHROID) 50 MCG tablet [Pharmacy Med Name: LEVOTHYROXINE 50 MCG TABLET] 90 tablet 0    Sig: Take 1 tablet (50 mcg total) by mouth daily.     Endocrinology:  Hypothyroid Agents Failed - 09/15/2018 12:32 PM      Failed - TSH needs to be rechecked within 3 months after an abnormal result. Refill until TSH is due.      Failed - TSH in normal range and within 360 days    TSH  Date Value Ref Range Status  08/20/2018 6.190 (H) 0.450 - 4.500 uIU/mL Final         Passed - Valid encounter within last 12 months    Recent Outpatient Visits          4 weeks ago Annual physical exam   [facility] Marnee Guarneri T, NP   2 months ago [place], [place], [place]   3 months ago Acute encephalopathy   [facility] Merrie Roof [place], [place]   1 year ago Hyperlipidemia, unspecified hyperlipidemia type   [facility] Kathrine Haddock, NP   1 year ago Need for pneumococcal vaccination   [facility] Kathrine Haddock, NP      Future Appointments            In 5 months Cannady, Barbaraann Faster, NP [employer], [facility]   In 10 months  [employer], [place]   In 60 months [place], Herbert Seta, [place]

## 2018-09-17 NOTE — Telephone Encounter (Signed)
Refill request approved.  Need f/u sooner than April for TSH.

## 2018-09-17 NOTE — Telephone Encounter (Signed)
Routing to close.  °

## 2018-09-18 ENCOUNTER — Other Ambulatory Visit: Payer: Self-pay | Admitting: Family Medicine

## 2018-09-18 DIAGNOSIS — R972 Elevated prostate specific antigen [PSA]: Secondary | ICD-10-CM

## 2018-09-18 MED ORDER — TAMSULOSIN HCL 0.4 MG PO CAPS: 0.4000 mg | ORAL_CAPSULE | Freq: Every day | ORAL | 3 refills | Status: DC

## 2018-09-19 ENCOUNTER — Other Ambulatory Visit: Payer: Self-pay

## 2018-09-19 DIAGNOSIS — D509 Iron deficiency anemia, unspecified: Secondary | ICD-10-CM

## 2018-09-19 NOTE — Telephone Encounter (Signed)
Pt aware. He agrees to have repeat labs as requested.

## 2018-09-19 NOTE — Telephone Encounter (Signed)
-----   Message from Roetta Sessions, [place] sent at 06/12/2018  1:49 PM EDT ----- Regarding: labs due Pt due for cbc in early November, 2019. Hematochezia, IDA

## 2018-09-25 ENCOUNTER — Other Ambulatory Visit
Admission: RE | Admit: 2018-09-25 | Discharge: 2018-09-25 | Disposition: A | Discharge status: Home / Self Care | Payer: Medicare HMO | Source: Ambulatory Visit | Attending: Gastroenterology | Admitting: Gastroenterology

## 2018-09-25 DIAGNOSIS — Z79899 Other long term (current) drug therapy: Secondary | ICD-10-CM | POA: Diagnosis not present

## 2018-09-25 DIAGNOSIS — D509 Iron deficiency anemia, unspecified: Secondary | ICD-10-CM | POA: Insufficient documentation

## 2018-09-25 DIAGNOSIS — E785 Hyperlipidemia, unspecified: Secondary | ICD-10-CM | POA: Diagnosis not present

## 2018-09-25 LAB — CBC WITH DIFFERENTIAL/PLATELET
ABS IMMATURE GRANULOCYTES: 0.02 10*3/uL (ref 0.00–0.07)
Basophils Absolute: 0.1 10*3/uL (ref 0.0–0.1)
Basophils Relative: 1 %
Eosinophils Absolute: 0.4 10*3/uL (ref 0.0–0.5)
Eosinophils Relative: 5 %
HEMATOCRIT: 43.7 % (ref 39.0–52.0)
Hemoglobin: 13.9 g/dL (ref 13.0–17.0)
IMMATURE GRANULOCYTES: 0 %
LYMPHS ABS: 2.8 10*3/uL (ref 0.7–4.0)
Lymphocytes Relative: 41 %
MCH: 29.3 pg (ref 26.0–34.0)
MCHC: 31.8 g/dL (ref 30.0–36.0)
MCV: 92 fL (ref 80.0–100.0)
MONO ABS: 0.5 10*3/uL (ref 0.1–1.0)
MONOS PCT: 7 %
NEUTROS ABS: 3.1 10*3/uL (ref 1.7–7.7)
NEUTROS PCT: 46 %
Platelets: 174 10*3/uL (ref 150–400)
RBC: 4.75 MIL/uL (ref 4.22–5.81)
RDW: 12.9 % (ref 11.5–15.5)
WBC: 6.8 10*3/uL (ref 4.0–10.5)
nRBC: 0 % (ref 0.0–0.2)

## 2018-09-25 LAB — LIPID PANEL
CHOL/HDL RATIO: 3.4 ratio
Cholesterol: 145 mg/dL (ref 0–200)
HDL: 43 mg/dL (ref >40)
LDL CALC: 71 mg/dL (ref 0–99)
Triglycerides: 157 mg/dL — ABNORMAL HIGH (ref <150)
VLDL: 31 mg/dL (ref 0–40)

## 2018-09-25 LAB — HEPATIC FUNCTION PANEL
ALBUMIN: 3.8 g/dL (ref 3.5–5.0)
ALT: 18 U/L (ref 0–44)
AST: 28 U/L (ref 15–41)
Alkaline Phosphatase: 94 U/L (ref 38–126)
Bilirubin, Direct: <0.1 mg/dL (ref 0.0–0.2)
TOTAL PROTEIN: 7.1 g/dL (ref 6.5–8.1)
Total Bilirubin: 0.7 mg/dL (ref 0.3–1.2)

## 2018-09-28 ENCOUNTER — Ambulatory Visit: Payer: Medicare HMO | Admitting: Cardiovascular Disease

## 2018-09-28 ENCOUNTER — Encounter: Payer: Self-pay | Admitting: Cardiovascular Disease

## 2018-09-28 VITALS — BP 98/60 | HR 55 | Ht 72.0 in | Wt 213.5 lb

## 2018-09-28 DIAGNOSIS — I714 Abdominal aortic aneurysm, without rupture, unspecified: Secondary | ICD-10-CM

## 2018-09-28 DIAGNOSIS — I480 Paroxysmal atrial fibrillation: Secondary | ICD-10-CM

## 2018-09-28 DIAGNOSIS — I10 Essential (primary) hypertension: Secondary | ICD-10-CM | POA: Diagnosis not present

## 2018-09-28 DIAGNOSIS — I2510 Atherosclerotic heart disease of native coronary artery without angina pectoris: Secondary | ICD-10-CM

## 2018-09-28 DIAGNOSIS — E785 Hyperlipidemia, unspecified: Secondary | ICD-10-CM

## 2018-09-28 NOTE — Patient Instructions (Signed)

## 2018-09-28 NOTE — Progress Notes (Signed)
Cardiology Office Note   Date:  09/28/2018   ID:  Stuart Harvey, DOB Aug 25, 1951, MRN [id number]  PCP:  Guadalupe Maple, MD  Cardiologist:   [name], MD   Chief Complaint  Patient presents with  . other    3 month f/u no complaints today. Meds reviewed verbally with pt.      History of Present Illness: Stuart Harvey is a 66 y.o. male who presents fora followup visit regarding coronary artery disease.  He has known history of severe three-vessel coronary artery disease diagnosed in 2008 as well as a large infrarenal abdominal aortic aneurysm. He underwent coronary artery bypass graft surgery in August 2008 at [facility]. He underwent open surgical repair of aortic aneurysm in October of 2008. No cardiac events since then.  He had an abnormal stress test in 2011. Cardiac catheterization showed patent grafts with an ejection fraction of 40%. Most recent stress test was in 2013 which showed evidence of prior inferolateral infarct with some peri-infarct ischemia and normal ejection fraction. This was compared to his previous stress testing in 2011 and basically was unchanged. Aortic aneurysm followup is done  by Dr. Lucky Cowboy.  Echocardiogram in October 2016 showed an ejection fraction of 50-55% with no significant valvular abnormalities.  He was hospitalized in July of this year with unresponsiveness.  He reportedly was down for about 1 hour with possible seizure-like activities.  He had numerous contusions.  Per EMS he was in ventricular tachycardia upon arrival and was cardioverted.  He was briefly intubated and was found to have mildly elevated troponin.  CPK was close to 4000.  Echo showed an EF of 50 to 55%.  He underwent cardiac catheterization which showed severe underlying three-vessel coronary artery disease with patent grafts including LIMA to LAD, SVG to RCA, SVG to diagonal and SVG to OM 3.  There was borderline disease in the SVG to OM but that was left to be treated medically  given that it was supplying a small area and the patient was recovering from acute renal failure and also had mental status changes.  The patient did develop atrial fibrillation while hospitalized.  Cardioversion was planned but the patient converted spontaneously.  He has been on Eliquis since then.  His presentation was felt to be possibly triggered by heat stroke.  He has been doing well with no recent chest pain, shortness of breath or palpitations.  Past Medical History:  Diagnosis Date  . AAA (abdominal aortic aneurysm) ([place])    s/p surgical repair in 08/2007 by Dr. Hulda Humphrey  . Anemia   . Arthritis    both knees  . CKD (chronic kidney disease), stage II   . Coronary artery disease    a. 4-vCABG @ [place] 06/2007 (LIMA-LAD, VG-RPDA, VG-OM3,VG-D2; b. LHC 2011: patent grafts EF 40%, c. LHC 06/05/18: LM nl, pLAD 50%, p-mLAD 50%, ostD1 70%, ost ramus 30%, ost-pLCx 40%, pLCx 100% (chronically occluded), OM3 100%, p-dRCA 100% (chronically occluded), LIMA-LAD patent, VG-RPDA patent, VG-OM3 mid-graft 60% and 80%  . Depression   . GERD (gastroesophageal reflux disease)   . Hernia of abdominal cavity   . Hyperlipidemia   . Hypertension   . Hypothyroidism     Past Surgical History:  Procedure Laterality Date  . ABDOMINAL AORTIC ANEURYSM REPAIR  08/2007  . ABDOMINAL AORTIC ANEURYSM REPAIR  11/2012   [facility]  . CARDIAC CATHETERIZATION  2008, 2011   [facility]   . CORONARY ARTERY BYPASS GRAFT  2008  .  CORONARY/GRAFT ANGIOGRAPHY N/A 06/05/2018   Procedure: CORONARY/GRAFT ANGIOGRAPHY;  Surgeon: Nelva Bush, MD;  Location: [place] [facility];  Service: Cardiovascular;  Laterality: N/A;  . EXPLORATORY LAPAROTOMY  1997  . HERNIA REPAIR    . JOINT REPLACEMENT     right knee  . KNEE ARTHROPLASTY Left 09/19/2016   Procedure: COMPUTER ASSISTED TOTAL KNEE ARTHROPLASTY;  Surgeon: Dereck Leep, MD;  Location: [facility] ORS;  Service: Orthopedics;  Laterality: Left;  . TOTAL KNEE ARTHROPLASTY Right 09/2016      Current Outpatient Medications  Medication Sig Dispense Refill  . apixaban (ELIQUIS) 5 MG TABS tablet Take 1 tablet (5 mg total) by mouth 2 (two) times daily. 180 tablet 2  . buPROPion (WELLBUTRIN SR) 200 MG 12 hr tablet Take 1 tablet (200 mg total) by mouth 2 (two) times daily. 180 tablet 3  . Cholecalciferol (D3 SUPER STRENGTH) 2000 UNITS CAPS Take 1 capsule by mouth daily.     . citalopram (CELEXA) 40 MG tablet Take 1 tablet (40 mg total) by mouth daily. 90 tablet 3  . levothyroxine (SYNTHROID, LEVOTHROID) 50 MCG tablet Take 1 tablet (50 mcg total) by mouth daily. 90 tablet 0  . metoprolol tartrate (LOPRESSOR) 25 MG tablet Take 0.5 tablets (12.5 mg total) by mouth 2 (two) times daily. 90 tablet 3  . omeprazole (PRILOSEC) 20 MG capsule Take 1 capsule (20 mg total) by mouth daily. 90 capsule 3  . rosuvastatin (CRESTOR) 40 MG tablet Take 1 tablet (40 mg total) by mouth daily. 90 tablet 3  . tadalafil (CIALIS) 5 MG tablet Take 1 tablet (5 mg total) by mouth daily as needed for erectile dysfunction. 30 tablet 11  . tamsulosin (FLOMAX) 0.4 MG CAPS capsule Take 1 capsule (0.4 mg total) by mouth daily. 90 capsule 3   No current facility-administered medications for this visit.     Allergies:   Benzodiazepines; Codeine; and Tetracycline    Social History:  The patient  reports that he quit smoking about 12 years ago. His smoking use included cigarettes. He has a 35.00 pack-year smoking history. His smokeless tobacco use includes chew. He reports that he does not drink alcohol or use drugs.   Family History:  The patient's family history includes Depression in his maternal grandfather and maternal grandmother; Heart attack in his father, mother, and unknown relative; Heart disease in his mother and unknown relative; Hypertension in his father and mother; Kidney disease in his brother.    ROS:  Please see the history of present illness.   Otherwise, review of systems are positive for none.    All other systems are reviewed and negative.    PHYSICAL EXAM: VS:  BP 98/60 (BP Location: Left Arm, Patient Position: Sitting, Cuff Size: Normal)   Pulse (!) 55   Ht 6' (1.829 m)   Wt 213 lb 8 oz (96.8 kg)   BMI 28.96 kg/m  , BMI Body mass index is 28.96 kg/m. GEN: Well nourished, well developed, in no acute distress  HEENT: normal  Neck: no JVD, carotid bruits, or masses Cardiac: RRR; no murmurs, rubs, or gallops,no edema  Respiratory:  clear to auscultation bilaterally, normal work of breathing GI: soft, nontender, nondistended, + BS MS: no deformity or atrophy  Skin: warm and dry, no rash Neuro:  Strength and sensation are intact Psych: euthymic mood, full affect   EKG:  EKG is ordered today. The ekg ordered today demonstrates sinus bradycardia with nonspecific IVCD and lateral T wave changes.  Recent  Labs: 06/07/2018: Magnesium 1.9 06/14/2018: BUN 32; Creatinine, Ser 1.30; Potassium 4.9; Sodium 140 08/20/2018: TSH 6.190 09/25/2018: ALT 18; Hemoglobin 13.9; Platelets 174    Lipid Panel    Component Value Date/Time   CHOL 145 09/25/2018 1208   CHOL 133 07/07/2017 1104   CHOL 159 12/18/2015 0827   TRIG 157 (H) 09/25/2018 1208   TRIG 166 (H) 12/18/2015 0827   HDL 43 09/25/2018 1208   HDL 47 07/07/2017 1104   CHOLHDL 3.4 09/25/2018 1208   VLDL 31 09/25/2018 1208   VLDL 33 (H) 12/18/2015 0827   LDLCALC 71 09/25/2018 1208   LDLCALC 71 07/07/2017 1104      Wt Readings from Last 3 Encounters:  09/28/18 213 lb 8 oz (96.8 kg)  09/11/18 210 lb (95.3 kg)  08/20/18 208 lb 12.8 oz (94.7 kg)       ASSESSMENT AND PLAN:  1.  Coronary artery disease involving native coronary arteries without angina: He is doing well overall. Most recent ejection fraction was 50-55% on echocardiogram.  Continue medical therapy.  I reviewed his most recent cardiac catheterization and I agree with medical therapy.  2. Essential hypertension:  Blood pressure is well controlled on current  medications.  3. Hyperlipidemia: Continue treatment with high-dose rosuvastatin.  Most recent LDL was 71.  4. Abdominal aortic aneurysm: Status post surgical repair. This is followed annually by Dr. Lucky Cowboy.  5.  Paroxysmal atrial fibrillation: He is currently in sinus rhythm.  He is tolerating anticoagulation with no bleeding side effects.   Disposition:   FU with me in 6 months  Signed,  [name], MD  09/28/2018 2:30 PM    [place]

## 2018-12-11 DIAGNOSIS — H25813 Combined forms of age-related cataract, bilateral: Secondary | ICD-10-CM | POA: Diagnosis not present

## 2018-12-15 ENCOUNTER — Other Ambulatory Visit: Payer: Self-pay | Admitting: Nurse Practitioner

## 2018-12-17 NOTE — Telephone Encounter (Signed)
Requested Prescriptions  Pending Prescriptions Disp Refills  . levothyroxine (SYNTHROID, LEVOTHROID) 50 MCG tablet [Pharmacy Med Name: LEVOTHYROXINE 50 MCG TABLET] 90 tablet 0    Sig: Take 1 tablet (50 mcg total) by mouth daily.     Endocrinology:  Hypothyroid Agents Failed - 12/15/2018 11:29 AM      Failed - TSH needs to be rechecked within 3 months after an abnormal result. Refill until TSH is due.      Failed - TSH in normal range and within 360 days    TSH  Date Value Ref Range Status  08/20/2018 6.190 (H) 0.450 - 4.500 uIU/mL Final         Passed - Valid encounter within last 12 months    Recent Outpatient Visits          3 months ago Annual physical exam   [facility] Venita Lick, NP   5 months ago [place], [place], [place]   6 months ago Acute encephalopathy   [facility] Merrie Roof [place], [place]   1 year ago Hyperlipidemia, unspecified hyperlipidemia type   [facility] Kathrine Haddock, NP   1 year ago Need for pneumococcal vaccination   [facility] Kathrine Haddock, NP      Future Appointments            In 2 months Cannady, Barbaraann Faster, NP [employer], [place]   In 7 months  [employer], [place]   In 8 months [place], Herbert Seta, MD [place]

## 2019-01-16 ENCOUNTER — Other Ambulatory Visit: Payer: Self-pay | Admitting: Unknown Physician Specialty

## 2019-02-07 ENCOUNTER — Other Ambulatory Visit: Payer: Self-pay | Admitting: Unknown Physician Specialty

## 2019-02-19 ENCOUNTER — Encounter: Payer: Self-pay | Admitting: Nurse Practitioner

## 2019-02-19 ENCOUNTER — Ambulatory Visit (INDEPENDENT_AMBULATORY_CARE_PROVIDER_SITE_OTHER): Payer: Medicare HMO | Admitting: Nurse Practitioner

## 2019-02-19 ENCOUNTER — Other Ambulatory Visit: Payer: Self-pay

## 2019-02-19 VITALS — BP 119/67 | HR 66

## 2019-02-19 DIAGNOSIS — D509 Iron deficiency anemia, unspecified: Secondary | ICD-10-CM | POA: Diagnosis not present

## 2019-02-19 DIAGNOSIS — I10 Essential (primary) hypertension: Secondary | ICD-10-CM | POA: Diagnosis not present

## 2019-02-19 DIAGNOSIS — I714 Abdominal aortic aneurysm, without rupture, unspecified: Secondary | ICD-10-CM

## 2019-02-19 DIAGNOSIS — I480 Paroxysmal atrial fibrillation: Secondary | ICD-10-CM

## 2019-02-19 DIAGNOSIS — E559 Vitamin D deficiency, unspecified: Secondary | ICD-10-CM

## 2019-02-19 DIAGNOSIS — E785 Hyperlipidemia, unspecified: Secondary | ICD-10-CM | POA: Diagnosis not present

## 2019-02-19 DIAGNOSIS — E039 Hypothyroidism, unspecified: Secondary | ICD-10-CM | POA: Diagnosis not present

## 2019-02-19 NOTE — Assessment & Plan Note (Signed)
Chronic, ongoing.  Continue current medication regimen.  Lipid panel at next visit in 3 months.

## 2019-02-19 NOTE — Patient Instructions (Signed)
Hypothyroidism  Hypothyroidism is when the thyroid gland does not make enough of certain hormones (it is underactive). The thyroid gland is a small gland located in the lower front part of the neck, just in front of the windpipe (trachea). This gland makes hormones that help control how the body uses food for energy (metabolism) as well as how the heart and brain function. These hormones also play a role in keeping your bones strong. When the thyroid is underactive, it produces too little of the hormones thyroxine (T4) and triiodothyronine (T3). What are the causes? This condition may be caused by:  Hashimoto's disease. This is a disease in which the body's disease-fighting system (immune system) attacks the thyroid gland. This is the most common cause.  Viral infections.  Pregnancy.  Certain medicines.  Birth defects.  Past radiation treatments to the head or neck for cancer.  Past treatment with radioactive iodine.  Past exposure to radiation in the environment.  Past surgical removal of part or all of the thyroid.  Problems with a gland in the center of the brain (pituitary gland).  Lack of enough iodine in the diet. What increases the risk? You are more likely to develop this condition if:  You are male.  You have a family history of thyroid conditions.  You use a medicine called lithium.  You take medicines that affect the immune system (immunosuppressants). What are the signs or symptoms? Symptoms of this condition include:  Feeling as though you have no energy (lethargy).  Not being able to tolerate cold.  Weight gain that is not explained by a change in diet or exercise habits.  Lack of appetite.  Dry skin.  Coarse hair.  Menstrual irregularity.  Slowing of thought processes.  Constipation.  Sadness or depression. How is this diagnosed? This condition may be diagnosed based on:  Your symptoms, your medical history, and a physical exam.  Blood  tests. You may also have imaging tests, such as an ultrasound or MRI. How is this treated? This condition is treated with medicine that replaces the thyroid hormones that your body does not make. After you begin treatment, it may take several weeks for symptoms to go away. Follow these instructions at home:  Take over-the-counter and prescription medicines only as told by your health care provider.  If you start taking any new medicines, tell your health care provider.  Keep all follow-up visits as told by your health care provider. This is important. ? As your condition improves, your dosage of thyroid hormone medicine may change. ? You will need to have blood tests regularly so that your health care provider can monitor your condition. Contact a health care provider if:  Your symptoms do not get better with treatment.  You are taking thyroid replacement medicine and you: ? Sweat a lot. ? Have tremors. ? Feel anxious. ? Lose weight rapidly. ? Cannot tolerate heat. ? Have emotional swings. ? Have diarrhea. ? Feel weak. Get help right away if you have:  Chest pain.  An irregular heartbeat.  A rapid heartbeat.  Difficulty breathing. Summary  Hypothyroidism is when the thyroid gland does not make enough of certain hormones (it is underactive).  When the thyroid is underactive, it produces too little of the hormones thyroxine (T4) and triiodothyronine (T3).  The most common cause is Hashimoto's disease, a disease in which the body's disease-fighting system (immune system) attacks the thyroid gland. The condition can also be caused by viral infections, medicine, pregnancy, or past   radiation treatment to the head or neck.  Symptoms may include weight gain, dry skin, constipation, feeling as though you do not have energy, and not being able to tolerate cold.  This condition is treated with medicine to replace the thyroid hormones that your body does not make. This information  is not intended to replace advice given to you by your health care provider. Make sure you discuss any questions you have with your health care provider. Document Released: 10/31/2005 Document Revised: 10/11/2017 Document Reviewed: 10/11/2017 Elsevier Interactive Patient Education  2019 [employer]  

## 2019-02-19 NOTE — Assessment & Plan Note (Signed)
Chronic, ongoing.  BP below goal today.  Continue current medication regimen.

## 2019-02-19 NOTE — Assessment & Plan Note (Signed)
Chronic, ongoing.  Continue current medication regimen and collaboration with cardiology. 

## 2019-02-19 NOTE — Assessment & Plan Note (Signed)
Chronic, ongoing.  Continue current Levothyroxine dose and obtain TSH next visit in 3 months.  Adjust dose as needed.

## 2019-02-19 NOTE — Progress Notes (Signed)
BP 119/67   Pulse 66    Subjective:    Patient ID: Stuart Harvey, male    DOB: Aug 11, 1951, 68 y.o.   MRN: [id number]  HPI: Stuart Harvey is a 68 y.o. male  Chief Complaint  Patient presents with  . Hypertension    . This visit was completed via telephone due to the restrictions of the COVID-19 pandemic. All issues as above were discussed and addressed but no physical exam was performed. If it was felt that the patient should be evaluated in the office, they were directed there. The patient verbally consented to this visit. Patient was unable to complete an audio/visual visit due to Lack of equipment. Due to the catastrophic nature of the COVID-19 pandemic, this visit was done through audio contact only. . Location of the patient: home . Location of the provider: home . Those involved with this call:  . Provider: Marnee Guarneri, DNP . CMA: Merilyn Baba, CMA . Front Desk/Registration: Don Perking  . Time spent on call: 15 minutes on the phone discussing health concerns   HYPERTENSION / HYPERLIPIDEMIA Followed by cardiology and last seen by Dr. Fletcher Anon on 09/28/18.  Most recent EF was 50-55% in 2016.  Has history of AAA repair in 2008 and bypass on 2008.  He reports he is seeing Dr. Fletcher Anon again in June. Satisfied with current treatment? yes Duration of hypertension: chronic BP monitoring frequency: a few times a week BP range: 110-120/70-80 BP medication side effects: no Duration of hyperlipidemia: chronic Cholesterol medication side effects: no Cholesterol supplements: none Medication compliance: good compliance Aspirin: no Recent stressors: no Recurrent headaches: no Visual changes: no Palpitations: no Dyspnea: no Chest pain: no Lower extremity edema: no Dizzy/lightheaded: no   HYPOTHYROIDISM Currently on Levothyroxine 50 MCG daily with last TSH in October 2019, 6.190. Thyroid control status:stable Satisfied with current treatment? yes Medication side  effects: no Medication compliance: good compliance Etiology of hypothyroidism:  Recent dose adjustment:no Fatigue: no Cold intolerance: no Heat intolerance: no Weight gain: no Weight loss: no Constipation: no Diarrhea/loose stools: no Palpitations: no Lower extremity edema: no Anxiety/depressed mood: no   ATRIAL FIBRILLATION Continues on Eliquis and is followed by cardiology. Atrial fibrillation status: stable Satisfied with current treatment: yes Medication side effects:  no Medication compliance: good compliance Etiology of atrial fibrillation:  Palpitations:  no Chest pain:  no Dyspnea on exertion:  no Orthopnea:  no Syncope:  no Edema:  no Ventricular rate control: B-blocker Anti-coagulation: long acting  Relevant past medical, surgical, family and social history reviewed and updated as indicated. Interim medical history since our last visit reviewed. Allergies and medications reviewed and updated.  Review of Systems  Constitutional: Negative for activity change, diaphoresis, fatigue and fever.  Respiratory: Negative for cough, chest tightness, shortness of breath and wheezing.   Cardiovascular: Negative for chest pain, palpitations and leg swelling.  Gastrointestinal: Negative for abdominal distention, abdominal pain, constipation, diarrhea, nausea and vomiting.  Endocrine: Negative for cold intolerance, heat intolerance, polydipsia, polyphagia and polyuria.  Musculoskeletal: Negative.   Skin: Negative.   Neurological: Negative for dizziness, syncope, weakness, light-headedness, numbness and headaches.  Psychiatric/Behavioral: Negative.     Per HPI unless specifically indicated above     Objective:    BP 119/67   Pulse 66   Wt Readings from Last 3 Encounters:  09/28/18 213 lb 8 oz (96.8 kg)  09/11/18 210 lb (95.3 kg)  08/20/18 208 lb 12.8 oz (94.7 kg)    Physical Exam  Unable to perform physical due to telephone encounter only.  Results for orders  placed or performed during the hospital encounter of 09/25/18  CBC with Differential/Platelet  Result Value Ref Range   WBC 6.8 4.0 - 10.5 K/uL   RBC 4.75 4.22 - 5.81 MIL/uL   Hemoglobin 13.9 13.0 - 17.0 g/dL   HCT 43.7 39.0 - 52.0 %   MCV 92.0 80.0 - 100.0 fL   MCH 29.3 26.0 - 34.0 pg   MCHC 31.8 30.0 - 36.0 g/dL   RDW 12.9 11.5 - 15.5 %   Platelets 174 150 - 400 K/uL   nRBC 0.0 0.0 - 0.2 %   Neutrophils Relative % 46 %   Neutro Abs 3.1 1.7 - 7.7 K/uL   Lymphocytes Relative 41 %   Lymphs Abs 2.8 0.7 - 4.0 K/uL   Monocytes Relative 7 %   Monocytes Absolute 0.5 0.1 - 1.0 K/uL   Eosinophils Relative 5 %   Eosinophils Absolute 0.4 0.0 - 0.5 K/uL   Basophils Relative 1 %   Basophils Absolute 0.1 0.0 - 0.1 K/uL   Immature Granulocytes 0 %   Abs Immature Granulocytes 0.02 0.00 - 0.07 K/uL  Lipid panel  Result Value Ref Range   Cholesterol 145 0 - 200 mg/dL   Triglycerides 157 (H) <150 mg/dL   HDL 43 >40 mg/dL   Total CHOL/HDL Ratio 3.4 RATIO   VLDL 31 0 - 40 mg/dL   LDL Cholesterol 71 0 - 99 mg/dL  Hepatic function panel  Result Value Ref Range   Total Protein 7.1 6.5 - 8.1 g/dL   Albumin 3.8 3.5 - 5.0 g/dL   AST 28 15 - 41 U/L   ALT 18 0 - 44 U/L   Alkaline Phosphatase 94 38 - 126 U/L   Total Bilirubin 0.7 0.3 - 1.2 mg/dL   Bilirubin, Direct <0.1 0.0 - 0.2 mg/dL   Indirect Bilirubin NOT CALCULATED 0.3 - 0.9 mg/dL      Assessment & Plan:   Problem List Items Addressed This Visit      Cardiovascular and Mediastinum   Hypertension    Chronic, ongoing.  BP below goal today.  Continue current medication regimen.        AAA (abdominal aortic aneurysm) ([place])    Followed by cardiology and vascular.  History of repair in 2008.  Continue to collaborate with team members.      Atrial fibrillation (HCC) - Primary    Chronic, ongoing.  Continue current medication regimen and collaboration with cardiology.          Endocrine   Hypothyroid    Chronic, ongoing.  Continue  current Levothyroxine dose and obtain TSH next visit in 3 months.  Adjust dose as needed.      Relevant Orders   Thyroid Panel With TSH     Other   Hyperlipidemia    Chronic, ongoing.  Continue current medication regimen.  Lipid panel at next visit in 3 months.      Relevant Orders   Lipid Panel w/o Chol/HDL Ratio   Comprehensive metabolic panel   Iron deficiency anemia   Relevant Orders   CBC with Differential/Platelet   Anemia panel    Other Visit Diagnoses    Vitamin D deficiency       Relevant Orders   VITAMIN D 25 Hydroxy (Vit-D Deficiency, Fractures)      I discussed the assessment and treatment plan with the patient. The patient was provided an opportunity to  ask questions and all were answered. The patient agreed with the plan and demonstrated an understanding of the instructions.   The patient was advised to call back or seek an in-person evaluation if the symptoms worsen or if the condition fails to improve as anticipated.   I provided 15 minutes of time during this encounter.  Follow up plan: Return in about 3 months (around 05/21/2019) for HTN/HLD, A-fib, Hypothyroid (needs labs).

## 2019-02-19 NOTE — Assessment & Plan Note (Signed)
Followed by cardiology and vascular.  History of repair in 2008.  Continue to collaborate with team members.

## 2019-02-27 ENCOUNTER — Ambulatory Visit: Payer: Self-pay | Admitting: Adult Health

## 2019-02-27 ENCOUNTER — Other Ambulatory Visit: Payer: Self-pay

## 2019-02-27 ENCOUNTER — Ambulatory Visit
Admission: RE | Admit: 2019-02-27 | Discharge: 2019-02-27 | Disposition: A | Discharge status: Home / Self Care | Payer: Medicare HMO | Source: Ambulatory Visit | Attending: Student | Admitting: Student

## 2019-02-27 ENCOUNTER — Other Ambulatory Visit
Admission: RE | Admit: 2019-02-27 | Discharge: 2019-02-27 | Disposition: A | Discharge status: Home / Self Care | Payer: Medicare HMO | Source: Ambulatory Visit | Attending: Family Medicine | Admitting: Family Medicine

## 2019-02-27 ENCOUNTER — Other Ambulatory Visit: Payer: Self-pay | Admitting: Student

## 2019-02-27 DIAGNOSIS — M79604 Pain in right leg: Secondary | ICD-10-CM

## 2019-02-27 DIAGNOSIS — M79661 Pain in right lower leg: Secondary | ICD-10-CM | POA: Diagnosis not present

## 2019-02-27 LAB — FIBRIN DERIVATIVES D-DIMER (ARMC ONLY): Fibrin derivatives D-dimer (ARMC): 2141.96 ng/mL (FEU) — ABNORMAL HIGH (ref 0.00–499.00)

## 2019-03-27 ENCOUNTER — Telehealth: Payer: Self-pay | Admitting: Family Medicine

## 2019-03-27 ENCOUNTER — Other Ambulatory Visit: Payer: Self-pay | Admitting: Physician Assistant

## 2019-03-27 ENCOUNTER — Other Ambulatory Visit: Payer: Self-pay | Admitting: Unknown Physician Specialty

## 2019-03-27 NOTE — Telephone Encounter (Signed)
Refill request for citalopram HBR 40 mg tab.   Prescription expired on 01/12/19  Kathrine Haddock, NP  LOV  02/19/19 NOV 05/22/2019

## 2019-03-27 NOTE — Telephone Encounter (Signed)
Pt last saw Dr Fletcher Anon 09/28/18, last labs 06/14/18 Creat 1.30, age 68, weight 96.8kg, based on specified criteria pt is on appropriate dosage of Eliquis 5mg  BID.  Will refill rx.

## 2019-03-28 MED ORDER — BUPROPION HCL ER (SR) 200 MG PO TB12: 200.0000 mg | ORAL_TABLET | Freq: Two times a day (BID) | ORAL | 1 refills | Status: DC

## 2019-03-28 NOTE — Telephone Encounter (Signed)
Prescription filled.

## 2019-04-12 ENCOUNTER — Other Ambulatory Visit: Payer: Self-pay | Admitting: Unknown Physician Specialty

## 2019-04-12 NOTE — Telephone Encounter (Signed)
Requested medication (s) are due for refill today: yes  Requested medication (s) are on the active medication list: yes  Last refill: 01/12/18  Future visit scheduled: yes  Notes to clinic:  Expired RX    Requested Prescriptions  Pending Prescriptions Disp Refills   metoprolol tartrate (LOPRESSOR) 25 MG tablet [Pharmacy Med Name: METOPROLOL TARTRATE 25 MG TAB] 90 tablet 0    Sig: Take 0.5 tablets (12.5 mg total) by mouth 2 (two) times daily.     Cardiovascular:  Beta Blockers Passed - 04/12/2019  2:53 PM      Passed - Last BP in normal range    BP Readings from Last 1 Encounters:  02/19/19 119/67         Passed - Last Heart Rate in normal range    Pulse Readings from Last 1 Encounters:  02/19/19 66         Passed - Valid encounter within last 6 months    Recent Outpatient Visits          1 month ago Paroxysmal atrial fibrillation ([place])   [place], Barbaraann Faster, NP   7 months ago Annual physical exam   [facility] Venita Lick, NP   9 months ago [place], Rachel [place], [place]   10 months ago Acute encephalopathy   [facility] Merrie Roof [place], [place]   1 year ago Hyperlipidemia, unspecified hyperlipidemia type   [facility] Kathrine Haddock, NP      Future Appointments            In 1 month Cannady, Barbaraann Faster, NP [employer], [place]   In 3 months  [employer], [place]   In 5 months Sninsky, Herbert Seta, [place]

## 2019-04-17 ENCOUNTER — Telehealth: Payer: Self-pay

## 2019-04-17 NOTE — Progress Notes (Signed)
Virtual Visit via Telephone Note   This visit type was conducted due to national recommendations for restrictions regarding the COVID-19 Pandemic (e.g. social distancing) in an effort to limit this patient's exposure and mitigate transmission in our community.  Due to his co-morbid illnesses, this patient is at least at moderate risk for complications without adequate follow up.  This format is felt to be most appropriate for this patient at this time.  The patient did not have access to video technology/had technical difficulties with video requiring transitioning to audio format only (telephone).  All issues noted in this document were discussed and addressed.  No physical exam could be performed with this format.  Please refer to the patient's chart for his  consent to telehealth for [facility].   Date:  04/19/2019   ID:  [name], DOB February 15, 1951, MRN [id number]  Patient Location: Home Provider Location: Home  PCP:  Guadalupe Maple, MD  Cardiologist:  [name], MD  Electrophysiologist:  None   Evaluation Performed:  Follow-Up Visit  Chief Complaint:  Follow up  History of Present Illness:    Stuart Harvey is a 68 y.o. male with history of CAD s/p CABG in 06/2007 at [facility], large infrarenal AAA s/p open surgical repair in 08/2007 now followed by Dr. Lucky Cowboy, PAF on Eliquis, CKD stage II, HTN, HLD, hypothyroidism, anemia, depression, and GERD who presents for follow up of his CAD and PAF.   Patient underwent 4-vessel CABG at [facility] in 06/2007 with LIMA-LAD, SVG-diagonal, SVG-OM3, SVG-rPDA. Following this, he underwent open surgical repair of his aortic aneurysm in 08/2007. Abnormal stress test in 2011 with subsequent LHC showing patent grafts with an EF of 40%. Follow up stress test in 2013 showed evidence of prior inferolateral infarct with some peri-infarct ischemia and a normal EF. This was essentially unchanged when compared to his prior stress test. Echo in 08/2015 showed an  EF of 50-55% with no significant valvular abnormalities. He was admitted in 05/2018 with unresponsiveness and was reportedly down for approximately 1 hour with possible seizure-like activity. Per EMS, he was in VT upon their arrival and was cardioverted. He was briefly intubated and found to have an elevated troponin. CPK was near 4000. Echo showed an EF of 50-55%. He underwent LHC which showed severe underlying 3-vessel CAD with patent grafts including LIMA-LAD, SVG-diagonal, SVG-OM3, SVG-RCA. There was borderline disease in the SVG-OM but was left to be treated medically given this supplied a small area and the patient was recovering from acute renal failure along with AMS. His hospitalization was also complicated by the development of Afib with spontaneous cardioversion prior to planned cardioversion. His presentation was felt to possibly triggered by heat stroke. Most recent abdominal ultrasound from 06/2018 showed no evidence of endoleak with a stable largest aortic diameter of 3.3 cm when compared to study in 05/2017. He was last seen in the office in 09/2018 and was doing well.   He was seen by PCP in 02/2019 with right lower extremity pain/swelling felt to be cellulitis and treated with Bactrim. D-dimer was elevated with follow up lower extremity ultrasound being negative for DVT.   He is seen in telemedicine today and is doing very well. He denies any chest pain, SOB, palpitations, dizziness, presyncope, or syncope. No lower extremity swelling, abdominal swelling, orthopnea, PND, or early satiety. No falls, BRBPR, or melena. Tolerating all medications without issues. He no longer works outdoors in the heat and stays well hydrated. He does not  have any concerns at this time.   Labs: 09/2018 - LFT normal, LDL 71, HGB 13.9 08/2018 - TSH 6.190 06/2018 - SCr 1.30, K+ 4.9  The patient does not have symptoms concerning for COVID-19 infection (fever, chills, cough, or new shortness of breath).    Past  Medical History:  Diagnosis Date  . AAA (abdominal aortic aneurysm) ([place])    s/p surgical repair in 08/2007 by Dr. Hulda Humphrey  . Anemia   . Arthritis    both knees  . CKD (chronic kidney disease), stage II   . Coronary artery disease    a. 4-vCABG @ [place] 06/2007 (LIMA-LAD, VG-RPDA, VG-OM3,VG-D2; b. LHC 2011: patent grafts EF 40%, c. LHC 06/05/18: LM nl, pLAD 50%, p-mLAD 50%, ostD1 70%, ost ramus 30%, ost-pLCx 40%, pLCx 100% (chronically occluded), OM3 100%, p-dRCA 100% (chronically occluded), LIMA-LAD patent, VG-RPDA patent, VG-OM3 mid-graft 60% and 80%  . Depression   . GERD (gastroesophageal reflux disease)   . Hernia of abdominal cavity   . Hyperlipidemia   . Hypertension   . Hypothyroidism    Past Surgical History:  Procedure Laterality Date  . ABDOMINAL AORTIC ANEURYSM REPAIR  08/2007  . ABDOMINAL AORTIC ANEURYSM REPAIR  11/2012   [facility]  . CARDIAC CATHETERIZATION  2008, 2011   [facility]   . CORONARY ARTERY BYPASS GRAFT  2008  . CORONARY/GRAFT ANGIOGRAPHY N/A 06/05/2018   Procedure: CORONARY/GRAFT ANGIOGRAPHY;  Surgeon: Nelva Bush, MD;  Location: [place] [facility];  Service: Cardiovascular;  Laterality: N/A;  . EXPLORATORY LAPAROTOMY  1997  . HERNIA REPAIR    . JOINT REPLACEMENT     right knee  . KNEE ARTHROPLASTY Left 09/19/2016   Procedure: COMPUTER ASSISTED TOTAL KNEE ARTHROPLASTY;  Surgeon: Dereck Leep, MD;  Location: [facility] ORS;  Service: Orthopedics;  Laterality: Left;  . TOTAL KNEE ARTHROPLASTY Right 09/2016     Current Meds  Medication Sig  . buPROPion (WELLBUTRIN SR) 200 MG 12 hr tablet Take 1 tablet (200 mg total) by mouth 2 (two) times daily.  . Cholecalciferol (D3 SUPER STRENGTH) 2000 UNITS CAPS Take 1 capsule by mouth daily.   . citalopram (CELEXA) 40 MG tablet Take 1 tablet (40 mg total) by mouth daily.  Marland Kitchen ELIQUIS 5 MG TABS tablet Take 1 tablet (5 mg total) by mouth 2 (two) times daily.  Marland Kitchen levothyroxine (SYNTHROID, LEVOTHROID) 50 MCG tablet Take 1 tablet (50 mcg  total) by mouth daily.  . metoprolol tartrate (LOPRESSOR) 25 MG tablet Take 0.5 tablets (12.5 mg total) by mouth 2 (two) times daily.  Marland Kitchen omeprazole (PRILOSEC) 20 MG capsule Take 1 capsule (20 mg total) by mouth daily.  . rosuvastatin (CRESTOR) 40 MG tablet Take 1 tablet (40 mg total) by mouth daily.  . tadalafil (CIALIS) 5 MG tablet Take 1 tablet (5 mg total) by mouth daily as needed for erectile dysfunction.  . tamsulosin (FLOMAX) 0.4 MG CAPS capsule Take 1 capsule (0.4 mg total) by mouth daily.     Allergies:   Benzodiazepines; Codeine; and Tetracycline   Social History   Tobacco Use  . Smoking status: Former Smoker    Packs/day: 1.00    Years: 35.00    Pack years: 35.00    Types: Cigarettes    Last attempt to quit: 12/16/2005    Years since quitting: 13.3  . Smokeless tobacco: Current User    Types: Chew  . Tobacco comment: pt states he very rarely uses smokeless tobacco   Substance Use Topics  . Alcohol use:  No  . Drug use: No     Family Hx: The patient's family history includes Depression in his maternal grandfather and maternal grandmother; Heart attack in his father, mother, and another family member; Heart disease in his mother and another family member; Hypertension in his father and mother; Kidney disease in his brother. There is no history of Prostate cancer, Kidney cancer, Stomach cancer, or Colon cancer.  ROS:   Please see the history of present illness.     All other systems reviewed and are negative.   Prior CV studies:   The following studies were reviewed today:  LHC 05/2018:  Conclusions: 1. Severe native coronary artery disease with subtotal/total occlusions of the proximal/mid LAD, proximal LCx, and proximal RCA. 2. Widely patent LIMA to LAD, SVG to diagonal, and SVG to RPDA. 3. Patent but diffusely diseased SVG to OM3.  There are sequential focal 60% and 80% stenoses in the middle of the graft.  Intervention was not attempted due to the patient's  inability to remain still on the table and consistently follow commands, as well as recent acute kidney injury in the setting of rhabdomyolysis.  Additionally, this graft supplies a relatively small territory.  Recommendations 1. Continue medical therapy and secondary prevention.  If platelets stabilize, recommend adding clopidogrel 75 mg daily to be continued at least 12 months. 2. If patient has signs/symptoms of ischemia or recurrent ventricular tachycardia, consider PCI to SVG to OM3 once mental status improves.  Recommend uninterrupted dual antiplatelet therapy with Aspirin 81mg  daily and Clopidogrel 75mg  daily for a minimum of 12 months (ACS - Class I recommendation).  Clopidogrel will be added once platelet count has stabilized. __________  2D Echo 05/2018: - Left ventricle: The cavity size was mildly dilated. Systolic function was normal. The estimated ejection fraction was in the range of 50% to 55%. Challenging images though grossly normal wall motion; there were grossly no significant regional wall motion abnormalities. Left ventricular diastolic function parameters were normal. - Ascending aorta: The ascending aorta was moderately dilated. 4.4 cm - Left atrium: The appendage was mildly to moderately dilated. - Right ventricle: Systolic function was normal. - Pulmonary arteries: PA peak pressure: 44 mm Hg (S).  Labs/Other Tests and Data Reviewed:    EKG:  No ECG reviewed.  Recent Labs: 06/07/2018: Magnesium 1.9 06/14/2018: BUN 32; Creatinine, Ser 1.30; Potassium 4.9; Sodium 140 08/20/2018: TSH 6.190 09/25/2018: ALT 18; Hemoglobin 13.9; Platelets 174   Recent Lipid Panel Lab Results  Component Value Date/Time   CHOL 145 09/25/2018 12:08 PM   CHOL 133 07/07/2017 11:04 AM   CHOL 159 12/18/2015 08:27 AM   TRIG 157 (H) 09/25/2018 12:08 PM   TRIG 166 (H) 12/18/2015 08:27 AM   HDL 43 09/25/2018 12:08 PM   HDL 47 07/07/2017 11:04 AM   CHOLHDL 3.4 09/25/2018 12:08 PM   LDLCALC 71  09/25/2018 12:08 PM   LDLCALC 71 07/07/2017 11:04 AM    Wt Readings from Last 3 Encounters:  04/19/19 212 lb (96.2 kg)  09/28/18 213 lb 8 oz (96.8 kg)  09/11/18 210 lb (95.3 kg)     Objective:    Vital Signs:  Ht 6' (1.829 m)   Wt 212 lb (96.2 kg)   BMI 28.75 kg/m    VITAL SIGNS:  reviewed  ASSESSMENT & PLAN:    1. CAD status post CABG without angina: He is doing well without any symptoms concerning for angina. He is on Eliquis in place of ASA given PAF as  below. Continue current therapy. No plans for ischemic evaluation at this time.   2. PAF: Noted during hospitalization in 05/2018 as outlined above. No symptoms concerning for recurrence of Afib. Continue rate control with metoprolol. Remains on Eliquis given CHADS2VASc 3 (HTN, age x 1, vascular disease) without evidence of bleed.   3. AAA: Status post surgical repair. Followed by [facility] with most recent imaging showing stable surgical repair without evidence of leak. Optimal BP/HR/lipid control. Follow up as directed.   4. HTN: No blood pressure readings form home as he does not have a cuff. BP at recent outside office visit in 02/2019 was well controlled. He will purchase a BP to monitor. No changes in therapy.   5. HLD: Most recent LDL of 71 from 09/2018. Remains on Crestor.   COVID-19 Education: The signs and symptoms of COVID-19 were discussed with the patient and how to seek care for testing (follow up with PCP or arrange E-visit).  The importance of social distancing was discussed today.  Time:   Today, I have spent 10 minutes with the patient with telehealth technology discussing the above problems.     Medication Adjustments/Labs and Tests Ordered: Current medicines are reviewed at length with the patient today.  Concerns regarding medicines are outlined above.   Tests Ordered: No orders of the defined types were placed in this encounter.   Medication Changes: No orders of the defined types were  placed in this encounter.   Disposition:  Follow up in 6 month(s)  Signed, Christell Faith, PA-C  04/19/2019 1:52 PM    [facility]

## 2019-04-17 NOTE — Telephone Encounter (Signed)
Virtual Visit Pre-Appointment Phone Call  "Stuart Harvey, I am calling you today to discuss your upcoming appointment. We are currently trying to limit exposure to the virus that causes COVID-19 by seeing patients at home rather than in the office."  1. "What is the BEST phone number to call the day of the visit?" - include this in appointment notes  2. Do you have or have access to (through a family member/friend) a smartphone with video capability that we can use for your visit?" a. If yes - list this number in appt notes as cell (if different from BEST phone #) and list the appointment type as a VIDEO visit in appointment notes b. If no - list the appointment type as a PHONE visit in appointment notes  3. Confirm consent - "In the setting of the current Covid19 crisis, you are scheduled for a phone visit with your provider on 04/19/2019 at 2:00PM.  Just as we do with many in-office visits, in order for you to participate in this visit, we must obtain consent.  If you'd like, I can send this to your mychart (if signed up) or email for you to review.  Otherwise, I can obtain your verbal consent now.  All virtual visits are billed to your insurance company just like a normal visit would be.  By agreeing to a virtual visit, we'd like you to understand that the technology does not allow for your provider to perform an examination, and thus may limit your provider's ability to fully assess your condition. If your provider identifies any concerns that need to be evaluated in person, we will make arrangements to do so.  Finally, though the technology is pretty good, we cannot assure that it will always work on either your or our end, and in the setting of a video visit, we may have to convert it to a phone-only visit.  In either situation, we cannot ensure that we have a secure connection.  Are you willing to proceed?" STAFF: Did the patient verbally acknowledge consent to telehealth visit? Document YES/NO here:  YES  4. Advise patient to be prepared - "Two hours prior to your appointment, go ahead and check your blood pressure, pulse, oxygen saturation, and your weight (if you have the equipment to check those) and write them all down. When your visit starts, your provider will ask you for this information. If you have an Apple Watch or Kardia device, please plan to have heart rate information ready on the day of your appointment. Please have a pen and paper handy nearby the day of the visit as well."  5. Give patient instructions for MyChart download to smartphone OR [email or web address] as below if video visit (depending on what platform provider is using)  6. Inform patient they will receive a phone call 15 minutes prior to their appointment time (may be from unknown caller ID) so they should be prepared to answer    TELEPHONE CALL NOTE  Stuart Harvey has been deemed a candidate for a follow-up tele-health visit to limit community exposure during the Covid-19 pandemic. I spoke with the patient via phone to ensure availability of phone/video source, confirm preferred email & phone number, and discuss instructions and expectations.  I reminded Stuart Harvey to be prepared with any vital sign and/or heart rhythm information that could potentially be obtained via home monitoring, at the time of his visit. I reminded Stuart Harvey to expect a phone call prior to his visit.  Stuart Harvey 04/17/2019 3:25 PM    FULL LENGTH CONSENT FOR TELE-HEALTH VISIT   I hereby voluntarily request, consent and authorize [facility] and its employed or contracted physicians, physician assistants, nurse practitioners or other licensed health care professionals (the Practitioner), to provide me with telemedicine health care services (the Services") as deemed necessary by the treating Practitioner. I acknowledge and consent to receive the Services by the Practitioner via telemedicine. I understand that the  telemedicine visit will involve communicating with the Practitioner through live audiovisual communication technology and the disclosure of certain medical information by electronic transmission. I acknowledge that I have been given the opportunity to request an in-person assessment or other available alternative prior to the telemedicine visit and am voluntarily participating in the telemedicine visit.  I understand that I have the right to withhold or withdraw my consent to the use of telemedicine in the course of my care at any time, without affecting my right to future care or treatment, and that the Practitioner or I may terminate the telemedicine visit at any time. I understand that I have the right to inspect all information obtained and/or recorded in the course of the telemedicine visit and may receive copies of available information for a reasonable fee.  I understand that some of the potential risks of receiving the Services via telemedicine include:   Delay or interruption in medical evaluation due to technological equipment failure or disruption;  Information transmitted may not be sufficient (e.g. poor resolution of images) to allow for appropriate medical decision making by the Practitioner; and/or   In rare instances, security protocols could fail, causing a breach of personal health information.  Furthermore, I acknowledge that it is my responsibility to provide information about my medical history, conditions and care that is complete and accurate to the best of my ability. I acknowledge that Practitioner's advice, recommendations, and/or decision may be based on factors not within their control, such as incomplete or inaccurate data provided by me or distortions of diagnostic images or specimens that may result from electronic transmissions. I understand that the practice of medicine is not an exact science and that Practitioner makes no warranties or guarantees regarding treatment  outcomes. I acknowledge that I will receive a copy of this consent concurrently upon execution via email to the email address I last provided but may also request a printed copy by calling the office of [place].    I understand that my insurance will be billed for this visit.   I have read or had this consent read to me.  I understand the contents of this consent, which adequately explains the benefits and risks of the Services being provided via telemedicine.   I have been provided ample opportunity to ask questions regarding this consent and the Services and have had my questions answered to my satisfaction.  I give my informed consent for the services to be provided through the use of telemedicine in my medical care  By participating in this telemedicine visit I agree to the above.

## 2019-04-19 ENCOUNTER — Telehealth (INDEPENDENT_AMBULATORY_CARE_PROVIDER_SITE_OTHER): Payer: Medicare HMO | Admitting: Physician Assistant

## 2019-04-19 ENCOUNTER — Other Ambulatory Visit: Payer: Self-pay

## 2019-04-19 ENCOUNTER — Encounter: Payer: Self-pay | Admitting: Physician Assistant

## 2019-04-19 VITALS — Ht 72.0 in | Wt 212.0 lb

## 2019-04-19 DIAGNOSIS — I2510 Atherosclerotic heart disease of native coronary artery without angina pectoris: Secondary | ICD-10-CM

## 2019-04-19 DIAGNOSIS — I714 Abdominal aortic aneurysm, without rupture, unspecified: Secondary | ICD-10-CM

## 2019-04-19 DIAGNOSIS — I10 Essential (primary) hypertension: Secondary | ICD-10-CM

## 2019-04-19 DIAGNOSIS — E785 Hyperlipidemia, unspecified: Secondary | ICD-10-CM

## 2019-04-19 DIAGNOSIS — I480 Paroxysmal atrial fibrillation: Secondary | ICD-10-CM

## 2019-04-19 NOTE — Patient Instructions (Signed)
It was a pleasure to speak with you on the phone today! Thank you for allowing us to continue taking care of your Heartcare needs during this time.   Feel free to call as needed for questions and concerns related to your cardiac needs.   Medication Instructions:  Your physician recommends that you continue on your current medications as directed. Please refer to the Current Medication list given to you today.  If you need a refill on your cardiac medications before your next appointment, please call your pharmacy.   Lab work: None ordered  If you have labs (blood work) drawn today and your tests are completely normal, you will receive your results only by: . MyChart Message (if you have MyChart) OR . A paper copy in the mail If you have any lab test that is abnormal or we need to change your treatment, we will call you to review the results.  Testing/Procedures: None ordered   Follow-Up: At [facility], you and your health needs are our priority.  As part of our continuing mission to provide you with exceptional heart care, we have created designated Provider Care Teams.  These Care Teams include your primary Cardiologist (physician) and Advanced Practice Providers (APPs -  Physician Assistants and Nurse Practitioners) who all work together to provide you with the care you need, when you need it. You will need a follow up appointment in 6 months.  Please call our office 2 months in advance to schedule this appointment.  You may see [name], MD or Ryan Dunn, PA-C.    

## 2019-05-22 ENCOUNTER — Ambulatory Visit: Payer: Medicare HMO | Admitting: Nurse Practitioner

## 2019-05-22 ENCOUNTER — Other Ambulatory Visit: Payer: Self-pay

## 2019-05-22 ENCOUNTER — Encounter: Payer: Self-pay | Admitting: Nurse Practitioner

## 2019-05-22 DIAGNOSIS — E785 Hyperlipidemia, unspecified: Secondary | ICD-10-CM | POA: Diagnosis not present

## 2019-05-22 DIAGNOSIS — I480 Paroxysmal atrial fibrillation: Secondary | ICD-10-CM

## 2019-05-22 DIAGNOSIS — E559 Vitamin D deficiency, unspecified: Secondary | ICD-10-CM

## 2019-05-22 DIAGNOSIS — D509 Iron deficiency anemia, unspecified: Secondary | ICD-10-CM | POA: Diagnosis not present

## 2019-05-22 DIAGNOSIS — D692 Other nonthrombocytopenic purpura: Secondary | ICD-10-CM

## 2019-05-22 DIAGNOSIS — F329 Major depressive disorder, single episode, unspecified: Secondary | ICD-10-CM

## 2019-05-22 DIAGNOSIS — I1310 Hypertensive heart and chronic kidney disease without heart failure, with stage 1 through stage 4 chronic kidney disease, or unspecified chronic kidney disease: Secondary | ICD-10-CM | POA: Diagnosis not present

## 2019-05-22 DIAGNOSIS — E039 Hypothyroidism, unspecified: Secondary | ICD-10-CM | POA: Diagnosis not present

## 2019-05-22 DIAGNOSIS — N183 Chronic kidney disease, stage 3 unspecified: Secondary | ICD-10-CM

## 2019-05-22 DIAGNOSIS — R69 Illness, unspecified: Secondary | ICD-10-CM | POA: Diagnosis not present

## 2019-05-22 DIAGNOSIS — F32A Depression, unspecified: Secondary | ICD-10-CM

## 2019-05-22 NOTE — Assessment & Plan Note (Signed)
Chronic, stable.  Continue current medication regimen.  Lipid panel and CMP today.

## 2019-05-22 NOTE — Assessment & Plan Note (Signed)
Bilateral upper extremity, takes Eliquis daily.   Continue to monitor for skin breakdown and encourage use of lotion daily to bilateral upper extremities. 

## 2019-05-22 NOTE — Progress Notes (Signed)
BP 133/85   Pulse 60   Temp 98.7 F (37.1 C) (Oral)   SpO2 98%    Subjective:    Patient ID: Stuart Harvey, male    DOB: 10-Feb-1951, 68 y.o.   MRN: [id number]  HPI: Stuart Harvey is a 68 y.o. male  Chief Complaint  Patient presents with  . Depression  . Hyperlipidemia  . Hypertension   HYPERTENSION / HYPERLIPIDEMIA Continues on Crestor 40 MG and Metoprolol 25 MG.  Last saw cardiology at the beginning of June.   Satisfied with current treatment? yes Duration of hypertension: chronic BP monitoring frequency: not checking BP range:  BP medication side effects: no Duration of hyperlipidemia: chronic Cholesterol medication side effects: no Cholesterol supplements: none Medication compliance: good compliance Aspirin: no Recent stressors: no Recurrent headaches: no Visual changes: no Palpitations: no Dyspnea: no Chest pain: no Lower extremity edema: no Dizzy/lightheaded: no   DEPRESSION Continues on Wellbutrin 200 MG BID and Lexapro 40 MG daily. Mood status: stable Satisfied with current treatment?: no Symptom severity: mild  Duration of current treatment : chronic Side effects: no Medication compliance: good compliance Psychotherapy/counseling: none Depressed mood: no Anxious mood: no Anhedonia: no Significant weight loss or gain: no Insomnia: occasionally hard to fall asleep Fatigue: no Feelings of worthlessness or guilt: no Impaired concentration/indecisiveness: no Suicidal ideations: no Hopelessness: no Crying spells: no Depression screen [facility] 2/9 05/22/2019 02/19/2019 08/20/2018 07/12/2018 06/14/2018  Decreased Interest 0 0 0 0 0  Down, Depressed, Hopeless 0 0 0 0 0  PHQ - 2 Score 0 0 0 0 0  Altered sleeping 0 1 0 - 2  Tired, decreased energy 0 0 1 - 1  Change in appetite 0 0 0 - 0  Feeling bad or failure about yourself  0 0 0 - 0  Trouble concentrating 0 0 0 - 0  Moving slowly or fidgety/restless 0 0 0 - 1  Suicidal thoughts 0 0 0 - 0  PHQ-9 Score 0  1 1 - 4  Difficult doing work/chores Not difficult at all Not difficult at all - - -   HYPOTHYROIDISM Currently taking Levothyroxine 50 MCG daily. Thyroid control status:stable Satisfied with current treatment? yes Medication side effects: no Medication compliance: good compliance Etiology of hypothyroidism: unknown Recent dose adjustment:no Fatigue: no Cold intolerance: no Heat intolerance: no Weight gain: no Weight loss: no Constipation: no Diarrhea/loose stools: no Palpitations: no Lower extremity edema: no Anxiety/depressed mood: no   ATRIAL FIBRILLATION Followed by cardiology.  Taking Eliquis and Metoprolol. Atrial fibrillation status: stable Satisfied with current treatment: yes  Medication side effects:  no Medication compliance: good compliance Etiology of atrial fibrillation:  Palpitations:  no Chest pain:  no Dyspnea on exertion:  no Orthopnea:  no Syncope:  no Edema:  no Ventricular rate control: B-blocker Anti-coagulation: long acting  Relevant past medical, surgical, family and social history reviewed and updated as indicated. Interim medical history since our last visit reviewed. Allergies and medications reviewed and updated.  Review of Systems  Constitutional: Negative for activity change, diaphoresis, fatigue and fever.  Respiratory: Negative for cough, chest tightness, shortness of breath and wheezing.   Cardiovascular: Negative for chest pain, palpitations and leg swelling.  Gastrointestinal: Negative for abdominal distention, abdominal pain, constipation, diarrhea, nausea and vomiting.  Endocrine: Negative for cold intolerance, heat intolerance, polydipsia, polyphagia and polyuria.  Musculoskeletal: Negative.   Skin: Negative.   Neurological: Negative for dizziness, syncope, weakness, light-headedness, numbness and headaches.  Psychiatric/Behavioral: Negative.  Per HPI unless specifically indicated above     Objective:    BP 133/85    Pulse 60   Temp 98.7 F (37.1 C) (Oral)   SpO2 98%   Wt Readings from Last 3 Encounters:  04/19/19 212 lb (96.2 kg)  09/28/18 213 lb 8 oz (96.8 kg)  09/11/18 210 lb (95.3 kg)    Physical Exam Vitals signs and nursing note reviewed.  Constitutional:      General: He is awake. He is not in acute distress.    Appearance: He is well-developed. He is not ill-appearing.  HENT:     Head: Normocephalic and atraumatic.     Right Ear: Hearing normal. No drainage.     Left Ear: Hearing normal. No drainage.     Mouth/Throat:     Pharynx: Uvula midline.  Eyes:     General: Lids are normal.        Right eye: No discharge.        Left eye: No discharge.     Conjunctiva/sclera: Conjunctivae normal.     Pupils: Pupils are equal, round, and reactive to light.  Neck:     Musculoskeletal: Normal range of motion and neck supple.     Thyroid: No thyromegaly.     Vascular: No carotid bruit or JVD.     Trachea: Trachea normal.  Cardiovascular:     Rate and Rhythm: Normal rate and regular rhythm.     Heart sounds: Normal heart sounds, S1 normal and S2 normal. No murmur. No gallop.   Pulmonary:     Effort: Pulmonary effort is normal.     Breath sounds: Normal breath sounds.  Abdominal:     General: Bowel sounds are normal.     Palpations: Abdomen is soft. There is no hepatomegaly or splenomegaly.     Tenderness: There is no abdominal tenderness.  Musculoskeletal: Normal range of motion.     Right lower leg: No edema.     Left lower leg: No edema.  Skin:    General: Skin is warm and dry.     Capillary Refill: Capillary refill takes less than 2 seconds.     Findings: No rash.     Comments: Scattered areas of pale purple bruises to bilateral upper extremities.  Intact skin.  Neurological:     Mental Status: He is alert and oriented to person, place, and time.     Deep Tendon Reflexes: Reflexes are normal and symmetric.  Psychiatric:        Mood and Affect: Mood normal.        Behavior:  Behavior normal. Behavior is cooperative.        Thought Content: Thought content normal.        Judgment: Judgment normal.     Results for orders placed or performed during the hospital encounter of 02/27/19  Fibrin derivatives D-Dimer ([facility] only)  Result Value Ref Range   Fibrin derivatives D-dimer ([facility]) 2,141.96 (H) 0.00 - 499.00 ng/mL (FEU)      Assessment & Plan:   Problem List Items Addressed This Visit      Cardiovascular and Mediastinum   Hypertensive heart/kidney disease without HF and with CKD stage III (HCC)    Chronic, stable with BP at goal today.  Continue current medication regimen and collaboration with cardiology.  Recheck CMP today.        Relevant Medications   rosuvastatin (CRESTOR) 40 MG tablet   Atrial fibrillation (HCC)    Chronic, ongoing with  rate controlled.  Continue current medication regimen and collaboration with cardiology.  CBC today.        Relevant Medications   rosuvastatin (CRESTOR) 40 MG tablet   Senile purpura (HCC)    Bilateral upper extremity, takes Eliquis daily.   Continue to monitor for skin breakdown and encourage use of lotion daily to bilateral upper extremities.      Relevant Medications   rosuvastatin (CRESTOR) 40 MG tablet     Endocrine   Hypothyroid    Chronic, ongoing.  Continue current medication regimen and adjust as needed.  Thyroid panel today.          Other   Hyperlipidemia    Chronic, stable.  Continue current medication regimen.  Lipid panel and CMP today.      Relevant Medications   rosuvastatin (CRESTOR) 40 MG tablet   Depression    Chronic, stable with PHQ 9 = 0.  Continue current medication regimen and consider reduction in upcoming months of Wellbutrin.            Follow up plan: Return in about 3 months (around 08/22/2019) for Annual physical.

## 2019-05-22 NOTE — Assessment & Plan Note (Signed)
Chronic, stable with PHQ 9 = 0.  Continue current medication regimen and consider reduction in upcoming months of Wellbutrin.

## 2019-05-22 NOTE — Assessment & Plan Note (Signed)
Chronic, stable with BP at goal today.  Continue current medication regimen and collaboration with cardiology.  Recheck CMP today.

## 2019-05-22 NOTE — Assessment & Plan Note (Signed)
Chronic, ongoing.  Continue current medication regimen and adjust as needed.  Thyroid panel today. 

## 2019-05-22 NOTE — Patient Instructions (Signed)

## 2019-05-22 NOTE — Assessment & Plan Note (Signed)
Chronic, ongoing with rate controlled.  Continue current medication regimen and collaboration with cardiology.  CBC today.

## 2019-05-23 LAB — ANEMIA PANEL
Ferritin: 31 ng/mL (ref 30–400)
Folate, Hemolysate: 407 ng/mL
Folate, RBC: 906 ng/mL (ref >498)
Hematocrit: 44.9 % (ref 37.5–51.0)
Iron Saturation: 19 % (ref 15–55)
Iron: 78 ug/dL (ref 38–169)
Retic Ct Pct: 1 % (ref 0.6–2.6)
Total Iron Binding Capacity: 405 ug/dL (ref 250–450)
UIBC: 327 ug/dL (ref 111–343)
Vitamin B-12: 491 pg/mL (ref 232–1245)

## 2019-05-23 LAB — THYROID PANEL WITH TSH
Free Thyroxine Index: 1.8 (ref 1.2–4.9)
T3 Uptake Ratio: 26 % (ref 24–39)
T4, Total: 7.1 ug/dL (ref 4.5–12.0)
TSH: 4.72 u[IU]/mL — ABNORMAL HIGH (ref 0.450–4.500)

## 2019-05-23 LAB — CBC WITH DIFFERENTIAL/PLATELET
Basophils Absolute: 0.1 10*3/uL (ref 0.0–0.2)
Basos: 1 %
EOS (ABSOLUTE): 0.3 10*3/uL (ref 0.0–0.4)
Eos: 4 %
Hemoglobin: 14.9 g/dL (ref 13.0–17.7)
Immature Grans (Abs): 0 10*3/uL (ref 0.0–0.1)
Immature Granulocytes: 0 %
Lymphocytes Absolute: 3.3 10*3/uL — ABNORMAL HIGH (ref 0.7–3.1)
Lymphs: 46 %
MCH: 29.3 pg (ref 26.6–33.0)
MCHC: 33.2 g/dL (ref 31.5–35.7)
MCV: 88 fL (ref 79–97)
Monocytes Absolute: 0.4 10*3/uL (ref 0.1–0.9)
Monocytes: 6 %
Neutrophils Absolute: 3 10*3/uL (ref 1.4–7.0)
Neutrophils: 43 %
Platelets: 160 10*3/uL (ref 150–450)
RBC: 5.08 x10E6/uL (ref 4.14–5.80)
RDW: 14.4 % (ref 11.6–15.4)
WBC: 7.1 10*3/uL (ref 3.4–10.8)

## 2019-05-23 LAB — COMPREHENSIVE METABOLIC PANEL
ALT: 12 IU/L (ref 0–44)
AST: 24 IU/L (ref 0–40)
Albumin/Globulin Ratio: 1.6 (ref 1.2–2.2)
Albumin: 4.4 g/dL (ref 3.8–4.8)
Alkaline Phosphatase: 102 IU/L (ref 39–117)
BUN/Creatinine Ratio: 22 (ref 10–24)
BUN: 29 mg/dL — ABNORMAL HIGH (ref 8–27)
Bilirubin Total: 0.4 mg/dL (ref 0.0–1.2)
CO2: 18 mmol/L — ABNORMAL LOW (ref 20–29)
Calcium: 9.4 mg/dL (ref 8.6–10.2)
Chloride: 106 mmol/L (ref 96–106)
Creatinine, Ser: 1.31 mg/dL — ABNORMAL HIGH (ref 0.76–1.27)
GFR calc Af Amer: 65 mL/min/{1.73_m2} (ref >59)
GFR calc non Af Amer: 56 mL/min/{1.73_m2} — ABNORMAL LOW (ref >59)
Globulin, Total: 2.7 g/dL (ref 1.5–4.5)
Glucose: 104 mg/dL — ABNORMAL HIGH (ref 65–99)
Potassium: 4.6 mmol/L (ref 3.5–5.2)
Sodium: 143 mmol/L (ref 134–144)
Total Protein: 7.1 g/dL (ref 6.0–8.5)

## 2019-05-23 LAB — LIPID PANEL W/O CHOL/HDL RATIO
Cholesterol, Total: 155 mg/dL (ref 100–199)
HDL: 49 mg/dL (ref >39)
LDL Calculated: 90 mg/dL (ref 0–99)
Triglycerides: 81 mg/dL (ref 0–149)
VLDL Cholesterol Cal: 16 mg/dL (ref 5–40)

## 2019-05-23 LAB — VITAMIN D 25 HYDROXY (VIT D DEFICIENCY, FRACTURES): Vit D, 25-Hydroxy: 87.7 ng/mL (ref 30.0–100.0)

## 2019-06-17 ENCOUNTER — Encounter (INDEPENDENT_AMBULATORY_CARE_PROVIDER_SITE_OTHER): Payer: Self-pay | Admitting: Nurse Practitioner

## 2019-06-17 ENCOUNTER — Ambulatory Visit (INDEPENDENT_AMBULATORY_CARE_PROVIDER_SITE_OTHER): Payer: Medicare HMO

## 2019-06-17 ENCOUNTER — Encounter (INDEPENDENT_AMBULATORY_CARE_PROVIDER_SITE_OTHER): Payer: Medicare HMO

## 2019-06-17 ENCOUNTER — Ambulatory Visit (INDEPENDENT_AMBULATORY_CARE_PROVIDER_SITE_OTHER): Payer: Medicare HMO | Admitting: Nurse Practitioner

## 2019-06-17 ENCOUNTER — Other Ambulatory Visit: Payer: Self-pay

## 2019-06-17 VITALS — BP 133/86 | HR 73 | Resp 16 | Ht 72.0 in | Wt 217.0 lb

## 2019-06-17 DIAGNOSIS — I714 Abdominal aortic aneurysm, without rupture, unspecified: Secondary | ICD-10-CM

## 2019-06-17 DIAGNOSIS — E785 Hyperlipidemia, unspecified: Secondary | ICD-10-CM

## 2019-06-17 DIAGNOSIS — M170 Bilateral primary osteoarthritis of knee: Secondary | ICD-10-CM | POA: Diagnosis not present

## 2019-06-17 NOTE — Progress Notes (Signed)
SUBJECTIVE:  Patient ID: Stuart Harvey, male    DOB: 25-Mar-1951, 68 y.o.   MRN: [id number] Chief Complaint  Patient presents with  . Follow-up    ultrasound follow tup    HPI  MOHD. DERFLINGER is a 68 y.o. male The patient returns to the office for surveillance of an abdominal aortic aneurysm status post stent graft placement on 08/29/2007, with leak repair on 12/05/2012.   Patient denies abdominal pain or back pain, no other abdominal complaints. No groin related complaints. No symptoms consistent with distal embolization No changes in claudication distance.   There have been no interval changes in his overall healthcare since his last visit.   Patient denies amaurosis fugax or TIA symptoms. There is no history of claudication or rest pain symptoms of the lower extremities. The patient denies angina or shortness of breath.   Duplex US of the aorta and iliac arteries shows a 3.7 cm AAA sac with no endoleak, slight increase in the sac compared to the previous study.  Past Medical History:  Diagnosis Date  . AAA (abdominal aortic aneurysm) ([place])    s/p surgical repair in 08/2007 by Dr. Hulda Humphrey  . Anemia   . Arthritis    both knees  . CKD (chronic kidney disease), stage II   . Coronary artery disease    a. 4-vCABG @ [place] 06/2007 (LIMA-LAD, VG-RPDA, VG-OM3,VG-D2; b. LHC 2011: patent grafts EF 40%, c. [facility] 06/05/18: LM nl, pLAD 50%, p-mLAD 50%, ostD1 70%, ost ramus 30%, ost-pLCx 40%, pLCx 100% (chronically occluded), OM3 100%, p-dRCA 100% (chronically occluded), LIMA-LAD patent, VG-RPDA patent, VG-OM3 mid-graft 60% and 80%  . Depression   . GERD (gastroesophageal reflux disease)   . Hernia of abdominal cavity   . Hyperlipidemia   . Hypertension   . Hypothyroidism     Past Surgical History:  Procedure Laterality Date  . ABDOMINAL AORTIC ANEURYSM REPAIR  08/2007  . ABDOMINAL AORTIC ANEURYSM REPAIR  11/2012   [facility]  . CARDIAC CATHETERIZATION  2008, 2011   [facility]   . CORONARY ARTERY  BYPASS GRAFT  2008  . CORONARY/GRAFT ANGIOGRAPHY N/A 06/05/2018   Procedure: CORONARY/GRAFT ANGIOGRAPHY;  Surgeon: Nelva Bush, MD;  Location: [facility];  Service: Cardiovascular;  Laterality: N/A;  . EXPLORATORY LAPAROTOMY  1997  . HERNIA REPAIR    . JOINT REPLACEMENT     right knee  . KNEE ARTHROPLASTY Left 09/19/2016   Procedure: COMPUTER ASSISTED TOTAL KNEE ARTHROPLASTY;  Surgeon: Dereck Leep, MD;  Location: [facility];  Service: Orthopedics;  Laterality: Left;  . TOTAL KNEE ARTHROPLASTY Right 09/2016    Social History   Socioeconomic History  . Marital status: Married    Spouse name: Not on file  . Number of children: Not on file  . Years of education: 87  . Highest education level: Some college, no degree  Occupational History  . Occupation: retired  Scientific laboratory technician  . Financial resource strain: Not hard at all  . Food insecurity    Worry: Never true    Inability: Never true  . Transportation needs    Medical: No    Non-medical: No  Tobacco Use  . Smoking status: Former Smoker    Packs/day: 1.00    Years: 35.00    Pack years: 35.00    Types: Cigarettes    Quit date: 12/16/2005    Years since quitting: 13.5  . Smokeless tobacco: Current User    Types: Chew  . Tobacco comment: pt  states he very rarely uses smokeless tobacco   Substance and Sexual Activity  . Alcohol use: No  . Drug use: No  . Sexual activity: Yes    Partners: Female  Lifestyle  . Physical activity    Days per week: 0 days    Minutes per session: 0 min  . Stress: Not at all  Relationships  . Social connections    Talks on phone: More than three times a week    Gets together: More than three times a week    Attends religious service: More than 4 times per year    Active member of club or organization: No    Attends meetings of clubs or organizations: Never    Relationship status: Married  . Intimate partner violence    Fear of current or ex partner: No    Emotionally abused: No     Physically abused: No    Forced sexual activity: No  Other Topics Concern  . Not on file  Social History Narrative  . Not on file    Family History  Problem Relation Age of Onset  . Heart disease Mother   . Heart attack Mother   . Hypertension Mother   . Heart attack Father   . Hypertension Father   . Kidney disease Brother   . Depression Maternal Grandmother   . Depression Maternal Grandfather   . Heart disease Other   . Heart attack Other   . Prostate cancer Neg Hx   . Kidney cancer Neg Hx   . Stomach cancer Neg Hx   . Colon cancer Neg Hx     Allergies  Allergen Reactions  . Benzodiazepines     Paradoxical agitation and delirium  . Codeine Nausea Only  . Tetracycline Rash     Review of Systems   Review of Systems: Negative Unless Checked Constitutional: [] Weight loss  [] Fever  [] Chills Cardiac: [] Chest pain   []  Atrial Fibrillation  [] Palpitations   [] Shortness of breath when laying flat   [] Shortness of breath with exertion. [] Shortness of breath at rest Vascular:  [] Pain in legs with walking   [] Pain in legs with standing [] Pain in legs when laying flat   [] Claudication    [] Pain in feet when laying flat    [] History of DVT   [] Phlebitis   [x] Swelling in legs   [x] Varicose veins   [] Non-healing ulcers Pulmonary:   [] Uses home oxygen   [] Productive cough   [] Hemoptysis   [] Wheeze  [] COPD   [] Asthma Neurologic:  [] Dizziness   [] Seizures  [] Blackouts [] History of stroke   [] History of TIA  [] Aphasia   [] Temporary Blindness   [] Weakness or numbness in arm   [] Weakness or numbness in leg Musculoskeletal:   [] Joint swelling   [] Joint pain   [] Low back pain  []  History of Knee Replacement [x] Arthritis [] back Surgeries  []  Spinal Stenosis    Hematologic:  [] Easy bruising  [] Easy bleeding   [] Hypercoagulable state   [] Anemic Gastrointestinal:  [] Diarrhea   [] Vomiting  [] Gastroesophageal reflux/heartburn   [] Difficulty swallowing. [] Abdominal pain Genitourinary:  [] Chronic  kidney disease   [] Difficult urination  [] Anuric   [] Blood in urine [] Frequent urination  [] Burning with urination   [] Hematuria Skin:  [] Rashes   [] Ulcers [] Wounds Psychological:  [] History of anxiety   []  History of major depression  []  Memory Difficulties      OBJECTIVE:   Physical Exam  BP 133/86 (BP Location: Right Arm)   Pulse 73   Resp  16   Ht 6' (1.829 m)   Wt 217 lb (98.4 kg)   BMI 29.43 kg/m   Gen: WD/WN, NAD Head: [place]/AT, No temporalis wasting.  Ear/Nose/Throat: Hearing grossly intact, nares w/o erythema or drainage Eyes: PER, EOMI, sclera nonicteric.  Neck: Supple, no masses.  No JVD.  Pulmonary:  Good air movement, no use of accessory muscles.  Cardiac: RRR Vascular:  Vessel Right Left  Radial Palpable Palpable  Dorsalis Pedis Palpable Palpable  Posterior Tibial Palpable Palpable   Gastrointestinal: soft, non-distended. No guarding/no peritoneal signs.  Musculoskeletal: M/S 5/5 throughout.  No deformity or atrophy.  Neurologic: Pain and light touch intact in extremities.  Symmetrical.  Speech is fluent. Motor exam as listed above. Psychiatric: Judgment intact, Mood & affect appropriate for pt's clinical situation. Dermatologic: No Venous rashes. No Ulcers Noted.  No changes consistent with cellulitis. Lymph : No Cervical lymphadenopathy, no lichenification or skin changes of chronic lymphedema.       ASSESSMENT AND PLAN:  1. Abdominal aortic aneurysm (AAA) without rupture (HCC) Duplex US of the aorta and iliac arteries shows a 3.7 cm AAA sac with no endoleak, slight increase in the sac compared to the previous study.  Recommend: Patient is status post successful endovascular repair of the AAA.   No further intervention is required at this time.   No endoleak is detected and the aneurysm sac is stable.  The patient will continue antiplatelet therapy as prescribed as well as aggressive management of hyperlipidemia. Exercise is again strongly encouraged.    However, endografts require continued surveillance with ultrasound or CT scan. This is mandatory to detect any changes that allow repressurization of the aneurysm sac.  The patient is informed that this would be asymptomatic.  The patient is reminded that lifelong routine surveillance is a necessity with an endograft. Patient will continue to follow-up at 6 month intervals with ultrasound of the aorta.Recommend: Patient is status post successful endovascular repair of the AAA.   No further intervention is required at this time.   No endoleak is detected and the aneurysm sac is stable.  The patient will continue antiplatelet therapy as prescribed as well as aggressive management of hyperlipidemia. Exercise is again strongly encouraged.   However, endografts require continued surveillance with ultrasound or CT scan. This is mandatory to detect any changes that allow repressurization of the aneurysm sac.  The patient is informed that this would be asymptomatic.  The patient is reminded that lifelong routine surveillance is a necessity with an endograft. Patient will continue to follow-up at 12 month intervals with ultrasound of the aorta. - VAS [place] EVAR DUPLEX; Future  2. Primary osteoarthritis of both knees Continue NSAID medications as already ordered, these medications have been reviewed and there are no changes at this time.  Continued activity and therapy was stressed.   3. Hyperlipidemia, unspecified hyperlipidemia type Continue statin as ordered and reviewed, no changes at this time    Current Outpatient Medications on File Prior to Visit  Medication Sig Dispense Refill  . buPROPion (WELLBUTRIN SR) 200 MG 12 hr tablet Take 1 tablet (200 mg total) by mouth 2 (two) times daily. 180 tablet 1  . Cholecalciferol (D3 SUPER STRENGTH) 2000 UNITS CAPS Take 1 capsule by mouth daily.     . citalopram (CELEXA) 40 MG tablet Take 1 tablet (40 mg total) by mouth daily. 90 tablet 1  . ELIQUIS 5 MG  TABS tablet Take 1 tablet (5 mg total) by mouth 2 (two) times daily.  180 tablet 1  . levothyroxine (SYNTHROID, LEVOTHROID) 50 MCG tablet Take 1 tablet (50 mcg total) by mouth daily. 90 tablet 0  . metoprolol tartrate (LOPRESSOR) 25 MG tablet Take 0.5 tablets (12.5 mg total) by mouth 2 (two) times daily. 90 tablet 1  . omeprazole (PRILOSEC) 20 MG capsule Take 1 capsule (20 mg total) by mouth daily. 90 capsule 0  . rosuvastatin (CRESTOR) 40 MG tablet Take 40 mg by mouth daily.    . tadalafil (CIALIS) 5 MG tablet Take 1 tablet (5 mg total) by mouth daily as needed for erectile dysfunction. 30 tablet 11  . tamsulosin (FLOMAX) 0.4 MG CAPS capsule Take 1 capsule (0.4 mg total) by mouth daily. 90 capsule 3   No current facility-administered medications on file prior to visit.     There are no Patient Instructions on file for this visit. Return in about 1 year (around 06/16/2020).   Kris Hartmann, NP  This note was completed with Sales executive.  Any errors are purely unintentional.

## 2019-07-02 ENCOUNTER — Other Ambulatory Visit: Payer: Self-pay | Admitting: Family Medicine

## 2019-07-15 ENCOUNTER — Ambulatory Visit (INDEPENDENT_AMBULATORY_CARE_PROVIDER_SITE_OTHER): Payer: Medicare HMO

## 2019-07-15 VITALS — BP 105/70 | HR 72 | Ht 71.0 in | Wt 215.0 lb

## 2019-07-15 DIAGNOSIS — Z0000 Encounter for general adult medical examination without abnormal findings: Secondary | ICD-10-CM

## 2019-07-15 NOTE — Progress Notes (Signed)
Subjective:   Stuart Harvey is a 68 y.o. male who presents for Medicare Annual/Subsequent preventive examination.  This visit is being conducted via phone call  - after an attmept to do on video chat - due to the COVID-19 pandemic. This patient has given me verbal consent via phone to conduct this visit, patient states they are participating from their home address. Some vital signs may be absent or patient reported.   Patient identification: identified by name, DOB, and current address.    Review of Systems:   Cardiac Risk Factors include: advanced age (>2men, >83 women);male gender;dyslipidemia;hypertension     Objective:    Vitals: BP 105/70 Comment: pt reported  Pulse 72 Comment: pt reported  Ht 5\' 11"  (1.803 m) Comment: pt reported  Wt 215 lb (97.5 kg) Comment: pt reported  SpO2 95%   BMI 29.99 kg/m   Body mass index is 29.99 kg/m.  Advanced Directives 07/15/2019 07/12/2018 06/03/2018 06/02/2018 07/05/2017 06/13/2017 01/02/2017  Does Patient Have a Medical Advance Directive? Yes Yes Yes No Yes Yes Yes  Type of Advance Directive Living will;Healthcare Power of [place];Living will [place];Living will - [place];Living will - -  Does patient want to make changes to medical advance directive? - - No - Patient declined - - - -  Copy of [place] in Chart? No - copy requested No - copy requested No - copy requested - No - copy requested - -  Would patient like information on creating a medical advance directive? - - No - Patient declined No - Patient declined - - -    Tobacco Social History   Tobacco Use  Smoking Status Former Smoker  . Packs/day: 1.00  . Years: 35.00  . Pack years: 35.00  . Types: Cigarettes  . Quit date: 12/16/2005  . Years since quitting: 13.5  Smokeless Tobacco Current User  . Types: Chew  Tobacco Comment   pt states he very rarely uses smokeless tobacco       Ready to quit: Not Answered Counseling given: Not Answered Comment: pt states he very rarely uses smokeless tobacco    Clinical Intake:  Pre-visit preparation completed: Yes  Pain : No/denies pain     Nutritional Status: BMI 25 -29 Overweight Nutritional Risks: None Diabetes: No  How often do you need to have someone help you when you read instructions, pamphlets, or other written materials from your doctor or pharmacy?: 1 - Never  Interpreter Needed?: No  Information entered by :: Briseyda Fehr,LPN  Past Medical History:  Diagnosis Date  . AAA (abdominal aortic aneurysm) ([place])    s/p surgical repair in 08/2007 by Dr. Hulda Humphrey  . Anemia   . Arthritis    both knees  . CKD (chronic kidney disease), stage II   . Coronary artery disease    a. 4-vCABG @ [facility] 06/2007 (LIMA-LAD, VG-RPDA, VG-OM3,VG-D2; b. LHC 2011: patent grafts EF 40%, c. [facility] 06/05/18: LM nl, pLAD 50%, p-mLAD 50%, ostD1 70%, ost ramus 30%, ost-pLCx 40%, pLCx 100% (chronically occluded), OM3 100%, p-dRCA 100% (chronically occluded), LIMA-LAD patent, VG-RPDA patent, VG-OM3 mid-graft 60% and 80%  . Depression   . GERD (gastroesophageal reflux disease)   . Hernia of abdominal cavity   . Hyperlipidemia   . Hypertension   . Hypothyroidism    Past Surgical History:  Procedure Laterality Date  . ABDOMINAL AORTIC ANEURYSM REPAIR  08/2007  . ABDOMINAL AORTIC ANEURYSM REPAIR  11/2012   [facility]  . CARDIAC CATHETERIZATION  2008, 2011   [facility]   . CORONARY ARTERY BYPASS GRAFT  2008  . CORONARY/GRAFT ANGIOGRAPHY N/A 06/05/2018   Procedure: CORONARY/GRAFT ANGIOGRAPHY;  Surgeon: Nelva Bush, MD;  Location: [facility];  Service: Cardiovascular;  Laterality: N/A;  . EXPLORATORY LAPAROTOMY  1997  . HERNIA REPAIR    . JOINT REPLACEMENT     right knee  . KNEE ARTHROPLASTY Left 09/19/2016   Procedure: COMPUTER ASSISTED TOTAL KNEE ARTHROPLASTY;  Surgeon: Dereck Leep, MD;  Location: [facility] ORS;  Service: Orthopedics;   Laterality: Left;  . TOTAL KNEE ARTHROPLASTY Right 09/2016   Family History  Problem Relation Age of Onset  . Heart disease Mother   . Heart attack Mother   . Hypertension Mother   . Heart attack Father   . Hypertension Father   . Kidney disease Brother   . Depression Maternal Grandmother   . Depression Maternal Grandfather   . Heart disease Other   . Heart attack Other   . Prostate cancer Neg Hx   . Kidney cancer Neg Hx   . Stomach cancer Neg Hx   . Colon cancer Neg Hx    Social History   Socioeconomic History  . Marital status: Married    Spouse name: Not on file  . Number of children: Not on file  . Years of education: 63  . Highest education level: Some college, no degree  Occupational History  . Occupation: retired  Scientific laboratory technician  . Financial resource strain: Not hard at all  . Food insecurity    Worry: Never true    Inability: Never true  . Transportation needs    Medical: No    Non-medical: No  Tobacco Use  . Smoking status: Former Smoker    Packs/day: 1.00    Years: 35.00    Pack years: 35.00    Types: Cigarettes    Quit date: 12/16/2005    Years since quitting: 13.5  . Smokeless tobacco: Current User    Types: Chew  . Tobacco comment: pt states he very rarely uses smokeless tobacco   Substance and Sexual Activity  . Alcohol use: No  . Drug use: No  . Sexual activity: Yes    Partners: Female  Lifestyle  . Physical activity    Days per week: 0 days    Minutes per session: 0 min  . Stress: Not at all  Relationships  . Social connections    Talks on phone: More than three times a week    Gets together: More than three times a week    Attends religious service: More than 4 times per year    Active member of club or organization: No    Attends meetings of clubs or organizations: Never    Relationship status: Married  Other Topics Concern  . Not on file  Social History Narrative  . Not on file    Outpatient Encounter Medications as of 07/15/2019   Medication Sig  . buPROPion (WELLBUTRIN SR) 200 MG 12 hr tablet Take 1 tablet (200 mg total) by mouth 2 (two) times daily.  . Cholecalciferol (D3 SUPER STRENGTH) 2000 UNITS CAPS Take 1 capsule by mouth daily.   . citalopram (CELEXA) 40 MG tablet Take 1 tablet (40 mg total) by mouth daily.  Marland Kitchen ELIQUIS 5 MG TABS tablet Take 1 tablet (5 mg total) by mouth 2 (two) times daily.  Marland Kitchen levothyroxine (SYNTHROID) 50 MCG tablet Take 1  tablet (50 mcg total) by mouth daily.  . metoprolol tartrate (LOPRESSOR) 25 MG tablet Take 0.5 tablets (12.5 mg total) by mouth 2 (two) times daily.  Marland Kitchen omeprazole (PRILOSEC) 20 MG capsule Take 1 capsule (20 mg total) by mouth daily.  . rosuvastatin (CRESTOR) 40 MG tablet Take 40 mg by mouth daily.  . tamsulosin (FLOMAX) 0.4 MG CAPS capsule Take 1 capsule (0.4 mg total) by mouth daily.  . tadalafil (CIALIS) 5 MG tablet Take 1 tablet (5 mg total) by mouth daily as needed for erectile dysfunction. (Patient not taking: Reported on 07/15/2019)   No facility-administered encounter medications on file as of 07/15/2019.     Activities of Daily Living In your present state of health, do you have any difficulty performing the following activities: 07/15/2019 02/19/2019  Hearing? N N  Comment no hearing aids -  Vision? N N  Comment glasses, dr.woodard annually -  Difficulty concentrating or making decisions? N Y  Comment - -  Walking or climbing stairs? N N  Comment - -  Dressing or bathing? N N  Doing errands, shopping? N N  Preparing Food and eating ? N -  Using the Toilet? N -  In the past six months, have you accidently leaked urine? N -  Do you have problems with loss of bowel control? N -  Managing your Medications? N -  Managing your Finances? N -  Housekeeping or managing your Housekeeping? N -  Some recent data might be hidden    Patient Care Team: Guadalupe Maple, MD as PCP - General (Family Medicine) Kathrine Haddock, NP as PCP - Family Medicine (Nurse Practitioner)  Wellington Hampshire, MD as PCP - Cardiology (Cardiology) Lucky Cowboy Erskine Squibb, MD as Referring Physician (Vascular Surgery) Wellington Hampshire, MD as Consulting Physician (Cardiology)   Assessment:   This is a routine wellness examination for [facility]  Exercise Activities and Dietary recommendations Current Exercise Habits: The patient does not participate in regular exercise at present, Exercise limited by: None identified  Goals    . DIET - INCREASE WATER INTAKE     Recommend drinking at least 6-8 glasses of water a day     . Quit smoking / using tobacco     Tobacco cessation discussed       Fall Risk: Fall Risk  07/15/2019 05/22/2019 02/19/2019 08/20/2018 07/12/2018  Falls in the past year? 0 0 - Yes Yes  Number falls in past yr: - 0 0 1 1  Injury with Fall? - 0 0 Yes Yes  Risk Factor Category  - - - - High Fall Risk  Follow up - Falls evaluation completed - Falls evaluation completed Falls prevention discussed    FALL RISK PREVENTION PERTAINING TO THE HOME:  Any stairs in or around the home? Yes  steps If so, are there any without handrails? No   Home free of loose throw rugs in walkways, pet beds, electrical cords, etc? Yes  Adequate lighting in your home to reduce risk of falls? Yes   ASSISTIVE DEVICES UTILIZED TO PREVENT FALLS:  Life alert? No  Use of a cane, walker or w/c? No  Grab bars in the bathroom? No  Shower chair or bench in shower? No  Elevated toilet seat or a handicapped toilet? No   TIMED UP AND GO:  Unable to perform   Depression Screen PHQ 2/9 Scores 07/15/2019 05/22/2019 02/19/2019 08/20/2018  PHQ - 2 Score 0 0 0 0  PHQ- 9 Score - 0  1 1    Cognitive Function MMSE - Mini Mental State Exam 07/01/2016  Orientation to time 5  Orientation to Place 5  Registration 3  Attention/ Calculation 5  Recall 2  Language- name 2 objects 2  Language- repeat 1  Language- follow 3 step command 3  Language- read & follow direction 1  Write a sentence 1  Copy design 1   Total score 29     6CIT Screen 07/12/2018 07/05/2017  What Year? 0 points 0 points  What month? 0 points 0 points  What time? 0 points 0 points  Count back from 20 0 points 0 points  Months in reverse 0 points 0 points  Repeat phrase 0 points 0 points  Total Score 0 0    Immunization History  Administered Date(s) Administered  . Pneumococcal Conjugate-13 07/01/2016  . Pneumococcal Polysaccharide-23 10/07/2009, 07/07/2017  . Tdap 06/16/2015  . Zoster 06/16/2015    Qualifies for Shingles Vaccine? Yes  Zostavax completed n/a. Due for Shingrix. Education has been provided regarding the importance of this vaccine. Pt has been advised to call insurance company to determine out of pocket expense. Advised may also receive vaccine at local pharmacy or Health Dept. Verbalized acceptance and understanding.  Tdap: up to date   Flu Vaccine: Due now   Pneumococcal Vaccine: up to date   Screening Tests Health Maintenance  Topic Date Due  . INFLUENZA VACCINE  06/15/2019  . COLONOSCOPY  08/31/2020  . TETANUS/TDAP  06/15/2025  . Hepatitis C Screening  Completed  . PNA vac Low Risk Adult  Completed   Cancer Screenings:  Colorectal Screening: Completed 08/31/2017. Repeat every 3 years  Lung Cancer Screening: (Low Dose CT Chest recommended if Age 2-80 years, 30 pack-year currently smoking OR have quit w/in 15years.) does not qualify.    Additional Screening:  Hepatitis C Screening: does qualify; Completed 08/20/2018  Vision Screening: Recommended annual ophthalmology exams for early detection of glaucoma and other disorders of the eye. Is the patient up to date with their annual eye exam?  Yes  Who is the provider or what is the name of the office in which the pt attends annual eye exams? Dr.Woodard    Dental Screening: Recommended annual dental exams for proper oral hygiene  Community Resource Referral:  CRR required this visit?  No        Plan:  I have personally reviewed  and addressed the Medicare Annual Wellness questionnaire and have noted the following in the patient's chart:  A. Medical and social history B. Use of alcohol, tobacco or illicit drugs  C. Current medications and supplements D. Functional ability and status E.  Nutritional status F.  Physical activity G. Advance directives H. List of other physicians I.  Hospitalizations, surgeries, and ER visits in previous 12 months J.  [place] such as hearing and vision if needed, cognitive and depression L. Referrals and appointments   In addition, I have reviewed and discussed with patient certain preventive protocols, quality metrics, and best practice recommendations. A written personalized care plan for preventive services as well as general preventive health recommendations were provided to patient.   Signed,   Bevelyn Ngo, LPN  [id number] Nurse Health Advisor   Nurse Notes: none

## 2019-07-31 ENCOUNTER — Other Ambulatory Visit: Payer: Self-pay | Admitting: Family Medicine

## 2019-07-31 ENCOUNTER — Telehealth: Payer: Self-pay | Admitting: Cardiovascular Disease

## 2019-07-31 NOTE — Telephone Encounter (Signed)
Pt c/o medication issue:  1. Name of Medication: Eliquis  2. How are you currently taking this medication (dosage and times per day)? 5 mg bid  3. Are you having a reaction (difficulty breathing--STAT)? N/A  4. What is your medication issue? Cost of medication, patient wanting to know if there is an alternate/generic medication or a cheaper route to take for this medication  Please advise

## 2019-08-01 NOTE — Telephone Encounter (Signed)
Spoke with the patient. Patient sts that his monthly cost for Eliquis has gone up to $100+ for a month supply. The patient is not sure why his out of pocket cost has increased. Adv the patient  to contact his insurance company to inquire. Donut hole vs. Preferred drug in that class. Adv the patient that the reason for the increase will be helpful in better assisting him. Patient voiced appreciation for the call back and will call back to update.

## 2019-08-05 NOTE — Telephone Encounter (Signed)
Patient is in donut hole.  Patient states the rest of year eliquis will cost $400 / month for the rest of the year.

## 2019-08-06 NOTE — Telephone Encounter (Signed)
Patient returning call from nurse re meds

## 2019-08-06 NOTE — Telephone Encounter (Signed)
Returned the patient's call. Unable to lmtcb. The patient's voicemail is full.

## 2019-08-06 NOTE — Telephone Encounter (Signed)
Spoke with the patient. Patient sts that he is in the donut hole and his out of pocket expense for Eliquis will be $380 a month through out the rest of the year.  Adv the patient that he could try applying for patient assistance through the manufacturer. Adv the patient that we could assist with the process. Patient sts that he does not want to apply for patient assistance. He would like to know what is his other options. Adv him that Coumadin is available in generic and may be more cost effective. Adv him that it will require management and there can be some dietary restrictions. He has a $20 copay in our office. Adv him that he may have to pay a copay for his INR checks. Adv him that his pcp's office may manage Coumadin and there may not be a copy with their office. Adv him he should contact their office to inquire.  Patient sts that he will pay the $380 if he has to. He will think about switching to Coumadin and contact our office if he decides to switch. Patient voiced appreciation for the call and the assistance.

## 2019-08-14 ENCOUNTER — Other Ambulatory Visit: Payer: Self-pay | Admitting: Family Medicine

## 2019-08-19 ENCOUNTER — Other Ambulatory Visit: Payer: Self-pay | Admitting: Physician Assistant

## 2019-09-10 ENCOUNTER — Other Ambulatory Visit: Payer: Self-pay

## 2019-09-10 ENCOUNTER — Other Ambulatory Visit: Payer: Medicare HMO

## 2019-09-10 ENCOUNTER — Other Ambulatory Visit
Admission: RE | Admit: 2019-09-10 | Discharge: 2019-09-10 | Disposition: A | Discharge status: Home / Self Care | Payer: Medicare HMO | Source: Ambulatory Visit | Attending: Urology | Admitting: Urology

## 2019-09-10 DIAGNOSIS — R972 Elevated prostate specific antigen [PSA]: Secondary | ICD-10-CM | POA: Insufficient documentation

## 2019-09-10 LAB — PSA: Prostatic Specific Antigen: 1.2 ng/mL (ref 0.00–4.00)

## 2019-09-10 NOTE — Addendum Note (Signed)
Addended by: Santiago Bur on: 09/10/2019 02:38 PM   Modules accepted: Orders

## 2019-09-12 ENCOUNTER — Encounter: Payer: Self-pay | Admitting: Urology

## 2019-09-12 ENCOUNTER — Ambulatory Visit: Payer: Medicare HMO | Admitting: Urology

## 2019-09-12 ENCOUNTER — Other Ambulatory Visit: Payer: Self-pay

## 2019-09-12 VITALS — BP 124/77 | HR 69 | Ht 71.0 in | Wt 216.0 lb

## 2019-09-12 DIAGNOSIS — N529 Male erectile dysfunction, unspecified: Secondary | ICD-10-CM | POA: Diagnosis not present

## 2019-09-12 DIAGNOSIS — N138 Other obstructive and reflux uropathy: Secondary | ICD-10-CM | POA: Diagnosis not present

## 2019-09-12 DIAGNOSIS — N401 Enlarged prostate with lower urinary tract symptoms: Secondary | ICD-10-CM | POA: Diagnosis not present

## 2019-09-12 DIAGNOSIS — Z125 Encounter for screening for malignant neoplasm of prostate: Secondary | ICD-10-CM

## 2019-09-12 DIAGNOSIS — R972 Elevated prostate specific antigen [PSA]: Secondary | ICD-10-CM | POA: Diagnosis not present

## 2019-09-12 MED ORDER — TAMSULOSIN HCL 0.4 MG PO CAPS: 0.4000 mg | ORAL_CAPSULE | Freq: Every day | ORAL | 3 refills | Status: DC

## 2019-09-12 NOTE — Patient Instructions (Signed)

## 2019-09-12 NOTE — Progress Notes (Signed)
   09/12/2019 3:33 PM   Stuart Harvey [birth year] [id number]  Reason for visit: Follow up BPH, polycystic left kidney, ED  HPI: I saw [name] back in urology clinic for BPH, PSA screening, polycystic left kidney, and erectile dysfunction.  I last saw him in October 2019, and he was previously followed by Dr. Tresa Moore.  He is a very comorbid 68 year old male with a extensive history of vascular disease, and remains on Eliquis.  He has a known polycystic left kidney that has been followed for over 5 years with MRI and ultrasound, with no concerning features.  He is asymptomatic with no recurrent infections.    Regarding his PSA screening PSA is stable this year at 1.2 from 1.7 last year.  In terms of his urinary symptoms, he reports a good stream on Flomax daily.  He does have some occasional urinary frequency, urgency, and rare urge incontinence, but this is more likely related to his coffee and diet Pepsi intake.  He has nocturia 2-3 times per night.  He denies any episodes of retention or difficulty with urination.  Finally, in terms of his erectile dysfunction, we had prescribed Cialis 5 mg on demand at our last visit last year, however he never filled this prescription, as he and his wife decided not to pursue this.  We reviewed his extensive problems at length, including the need for Eliquis with his extensive vascular disease and atrial fibrillation.  Continue Flomax, we discussed behavioral strategies including minimizing fluids in the evening, double voiding prior to bed, and minimizing coffee, soda, and tea in the diet.  Continue Flomax PSA in 1 year No routine surveillance imaging regarding polycystic kidney needed   A total of 25 minutes were spent face-to-face with the patient, greater than 50% was spent in patient education, counseling, and coordination of care regarding ED, BPH, PSA screening, and polycystic kidney.  B[place]ley Co, [facility] [address], [place], [place] [address] [phone]

## 2019-09-15 DIAGNOSIS — Z8616 Personal history of COVID-19: Secondary | ICD-10-CM

## 2019-09-15 HISTORY — DX: Personal history of COVID-19: Z86.16

## 2019-10-11 DIAGNOSIS — Z20828 Contact with and (suspected) exposure to other viral communicable diseases: Secondary | ICD-10-CM | POA: Diagnosis not present

## 2019-10-11 DIAGNOSIS — U071 COVID-19: Secondary | ICD-10-CM | POA: Diagnosis not present

## 2019-10-14 ENCOUNTER — Ambulatory Visit: Payer: Self-pay

## 2019-10-14 ENCOUNTER — Telehealth: Payer: Self-pay | Admitting: Cardiovascular Disease

## 2019-10-14 NOTE — Telephone Encounter (Signed)
Patient calling  Patient has recently tested positive for COVID19 - was told to make cardiologist aware Please call with any concerns

## 2019-10-14 NOTE — Telephone Encounter (Signed)
Patient calling in to make [place] aware that he is covid + provided care advice.  Patient voiced understanding.

## 2019-10-14 NOTE — Telephone Encounter (Signed)
FYI update forwarded to Dr. Fletcher Anon

## 2019-10-14 NOTE — Telephone Encounter (Signed)
Ok

## 2019-10-17 ENCOUNTER — Other Ambulatory Visit: Payer: Self-pay | Admitting: Nurse Practitioner

## 2019-10-17 DIAGNOSIS — R059 Cough, unspecified: Secondary | ICD-10-CM

## 2019-10-17 DIAGNOSIS — R05 Cough: Secondary | ICD-10-CM

## 2019-10-17 DIAGNOSIS — N183 Chronic kidney disease, stage 3 unspecified: Secondary | ICD-10-CM

## 2019-10-17 DIAGNOSIS — U071 COVID-19: Secondary | ICD-10-CM

## 2019-10-17 NOTE — Telephone Encounter (Signed)
Message fwd to Dr. Arida to advise. 

## 2019-10-17 NOTE — Telephone Encounter (Signed)
I called the patient's wife and discussed with her the risks and benefits.  I think it is a good treatment for this patient.

## 2019-10-17 NOTE — Progress Notes (Signed)
  I connected by phone with Stuart Harvey on 10/17/2019 at 10:03 AM to discuss the potential use of an new treatment for mild to moderate COVID-19 viral infection in non-hospitalized patients.  This patient is a 68 y.o. male that meets the FDA criteria for Emergency Use Authorization of bamlanivimab:  Has a (+) direct SARS-CoV-2 viral test result  Has mild or moderate COVID-19   Is ? 68 years of age and weighs ? 40 kg  Is NOT hospitalized due to COVID-19  Is NOT requiring oxygen therapy or requiring an increase in baseline oxygen flow rate due to COVID-19  Is within 10 days of symptom onset  Has at least one of the high risk factor(s) for progression to severe COVID-19 and/or hospitalization as defined in EUA.  Specific high risk criteria : Chronic Kidney Disease (CKD) Patient was tested at [facility] and will bring copy of positive covid results with him for infusion. Patient is being managed for the following problems: Patient Active Problem List   Diagnosis Date Noted  . Senile purpura ([place]) 08/20/2018  . Atrial fibrillation ([place]) 06/14/2018  . Demand ischemia ([place])   . Ventricular tachycardia ([place])   . Non-ST elevation (NSTEMI) myocardial infarction ([place])   . Iron deficiency anemia 08/10/2017  . Elevated PSA 07/10/2017  . Varicose veins of both lower extremities with inflammation 06/13/2017  . Counseling regarding advanced directives and goals of care 01/02/2017  . CKD (chronic kidney disease) stage 3, GFR 30-59 ml/min 12/18/2015  . IFG (impaired fasting glucose) 12/18/2015  . Hypothyroid 12/18/2015  . Renal mass 08/17/2015  . H/O total knee replacement 08/28/2014  . Arthritis of knee, degenerative 05/05/2014  . Depression 08/28/2012  . Coronary artery disease   . 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 Hyperlipidemia   . AAA (abdominal aortic aneurysm) ([place])     I have spoken and communicated the following to the patient or  parent/caregiver:  1. FDA has authorized the emergency use of bamlanivimab for the treatment of mild to moderate COVID-19 in adults and pediatric patients with positive results of direct SARS-CoV-2 viral testing who are 31 years of age and older weighing at least 40 kg, and who are at high risk for progressing to severe COVID-19 and/or hospitalization.  2. The significant known and potential risks and benefits of bamlanivimab, and the extent to which such potential risks and benefits are unknown.  3. Information on available alternative treatments and the risks and benefits of those alternatives, including clinical trials.  4. Patients treated with bamlanivimab should continue to self-isolate and use infection control measures (e.g., wear mask, isolate, social distance, avoid sharing personal items, clean and disinfect "high touch" surfaces, and frequent handwashing) according to CDC guidelines.   5. The patient or parent/caregiver has the option to accept or refuse bamlanivimab.  After reviewing this information with the patient, The patient agreed to proceed with receiving the infusion of bamlanivimab and will be provided a copy of the Fact sheet prior to receiving the infusion.  [name] 10/17/2019 10:03 AM

## 2019-10-17 NOTE — Telephone Encounter (Signed)
Patient spouse calling Would like to know if Dr Fletcher Anon would recommend patient getting antibody infusion for COVID19 Please call wife to discuss at [phone]

## 2019-10-17 NOTE — Progress Notes (Signed)
Reports he was tested positive for Covid at [facility]

## 2019-10-18 ENCOUNTER — Ambulatory Visit (HOSPITAL_COMMUNITY)
Admission: RE | Admit: 2019-10-18 | Discharge: 2019-10-18 | Disposition: A | Discharge status: Home / Self Care | Payer: Medicare Other | Source: Ambulatory Visit | Attending: Critical Care Medicine | Admitting: Critical Care Medicine

## 2019-10-18 DIAGNOSIS — U071 COVID-19: Secondary | ICD-10-CM

## 2019-10-18 DIAGNOSIS — Z23 Encounter for immunization: Secondary | ICD-10-CM | POA: Diagnosis not present

## 2019-10-18 DIAGNOSIS — N183 Chronic kidney disease, stage 3 unspecified: Secondary | ICD-10-CM | POA: Diagnosis not present

## 2019-10-18 MED ORDER — ALBUTEROL SULFATE HFA 108 (90 BASE) MCG/ACT IN AERS: 2.0000 | INHALATION_SPRAY | Freq: Once | RESPIRATORY_TRACT | Status: DC | PRN

## 2019-10-18 MED ORDER — SODIUM CHLORIDE 0.9 % IV SOLN: INTRAVENOUS | Status: DC | PRN

## 2019-10-18 MED ORDER — EPINEPHRINE 0.3 MG/0.3ML IJ SOAJ: 0.3000 mg | Freq: Once | INTRAMUSCULAR | Status: DC | PRN

## 2019-10-18 MED ORDER — DIPHENHYDRAMINE HCL 50 MG/ML IJ SOLN: 50.0000 mg | Freq: Once | INTRAMUSCULAR | Status: DC | PRN

## 2019-10-18 MED ORDER — METHYLPREDNISOLONE SODIUM SUCC 125 MG IJ SOLR: 125.0000 mg | Freq: Once | INTRAMUSCULAR | Status: DC | PRN

## 2019-10-18 MED ORDER — SODIUM CHLORIDE 0.9 % IV SOLN
700.0000 mg | Freq: Once | INTRAVENOUS | Status: AC
  Administered 2019-10-18: 700 mg via INTRAVENOUS
  Filled 2019-10-18: qty 20

## 2019-10-18 MED ORDER — FAMOTIDINE IN NACL 20-0.9 MG/50ML-% IV SOLN: 20.0000 mg | Freq: Once | INTRAVENOUS | Status: DC | PRN

## 2019-10-18 NOTE — Progress Notes (Signed)
  Diagnosis: COVID-19  Physician: Dr. Joya Gaskins   Procedure: bamlanivimab infusion Provided patient with bamlanivimab fact sheet for patients, parents and caregivers prior to infusion.  Complications: Pt tolerated without difficulty. He reports having a temperature that has been treated with tylenol. Last dose was this morning. He will go home and take another dose.   Discharge: Discharged home   Suda Forbess, Cleaster Corin 10/18/2019

## 2019-10-18 NOTE — Progress Notes (Signed)
Infusion initiated at 1123.

## 2019-10-18 NOTE — Progress Notes (Signed)
Medication has been infusing for 15 minutes. Patient reports no complaints at this time. Will continue to monitor.

## 2019-10-23 ENCOUNTER — Ambulatory Visit (INDEPENDENT_AMBULATORY_CARE_PROVIDER_SITE_OTHER): Payer: Medicare HMO | Admitting: Nurse Practitioner

## 2019-10-23 ENCOUNTER — Encounter: Payer: Self-pay | Admitting: Nurse Practitioner

## 2019-10-23 ENCOUNTER — Other Ambulatory Visit: Payer: Self-pay

## 2019-10-23 DIAGNOSIS — U071 COVID-19: Secondary | ICD-10-CM | POA: Diagnosis not present

## 2019-10-23 MED ORDER — AZITHROMYCIN 250 MG PO TABS: ORAL_TABLET | ORAL | 0 refills | Status: DC

## 2019-10-23 NOTE — Assessment & Plan Note (Signed)
Acute with fever continuing intermittent.  Symptoms mainly improved per patient report, with minimal cough and no SOB.  Had monoclonal antibody treatment on Friday.  Script for Zpack sent in for continued congestion and fever.  Recommend maintaining quarantine until fever and symptom free x 3 days.  Increase hydration and rest at home.  If any SOB or difficulty breathing immediately go to ER.  Return in one week for follow-up.

## 2019-10-23 NOTE — Progress Notes (Signed)
There were no vitals taken for this visit.   Subjective:    Patient ID: Stuart Harvey, male    DOB: Sep 02, 1951, 68 y.o.   MRN: [id number]  HPI: Stuart Harvey is a 68 y.o. male  Chief Complaint  Patient presents with  . Follow-up    Tested positive for COVID on 10/11/19    . This visit was completed via telephone due to the restrictions of the COVID-19 pandemic. All issues as above were discussed and addressed but no physical exam was performed. If it was felt that the patient should be evaluated in the office, they were directed there. The patient verbally consented to this visit. Patient was unable to complete an audio/visual visit due to lack of equipment. Due to the catastrophic nature of the COVID-19 pandemic, this visit was done through audio contact only. . Location of the patient: home . Location of the provider: work . Those involved with this call:  . Provider: Marnee Guarneri, DNP . CMA: Yvonna Alanis, CMA . Front Desk/Registration: Jill Side  . Time spent on call: 15 minutes on the phone discussing health concerns. 10 minutes total spent in review of patient's record and preparation of their chart.  . I verified patient identity using two factors (patient name and date of birth). Patient consents verbally to being seen via telemedicine visit today.    COVID FOLLOW-UP: He tested positive for Covid on 10/11/2019.  Continues with fevers, in 101.  Had monoclonal antibody infusion on Friday.  Has been taking Tylenol and Advil.  A little loss of taste or smell.   Fever: yes Cough: yes Shortness of breath: no Wheezing: no Chest pain: yes, with cough Chest tightness: no Chest congestion: no Nasal congestion: yes Runny nose: no Post nasal drip: no Sneezing: no Sore throat: no Swollen glands: no Sinus pressure: no Headache: no Face pain: no Toothache: no Ear pain: none Ear pressure: none Eyes red/itching:no Eye drainage/crusting: no  Vomiting: no Rash:  no Fatigue: yes Sick contacts: yes Strep contacts: no  Context: stable Recurrent sinusitis: no Relief with OTC cold/cough medications: yes  Treatments attempted: cold/sinus   Relevant past medical, surgical, family and social history reviewed and updated as indicated. Interim medical history since our last visit reviewed. Allergies and medications reviewed and updated.  Review of Systems  Constitutional: Positive for fatigue and fever. Negative for activity change, chills and diaphoresis.  HENT: Positive for congestion. Negative for ear discharge, ear pain, postnasal drip, rhinorrhea, sinus pressure, sinus pain, sneezing and sore throat.   Respiratory: Positive for cough (nonproductive, minimal). Negative for chest tightness, shortness of breath and wheezing.   Cardiovascular: Negative for chest pain, palpitations and leg swelling.  Gastrointestinal: Negative for abdominal distention, abdominal pain, constipation, diarrhea, nausea and vomiting.  Endocrine: Negative for cold intolerance, heat intolerance, polydipsia, polyphagia and polyuria.  Musculoskeletal: Negative.   Skin: Negative.   Neurological: Negative for dizziness, syncope, weakness, light-headedness, numbness and headaches.  Psychiatric/Behavioral: Negative.     Per HPI unless specifically indicated above     Objective:    There were no vitals taken for this visit.  Wt Readings from Last 3 Encounters:  09/12/19 216 lb (98 kg)  07/15/19 215 lb (97.5 kg)  06/17/19 217 lb (98.4 kg)    Physical Exam   Unable to assess due to telephone visit only  Results for orders placed or performed during the hospital encounter of 09/10/19  PSA  Result Value Ref Range   Prostatic Specific  Antigen 1.20 0.00 - 4.00 ng/mL      Assessment & Plan:   Problem List Items Addressed This Visit      Other   Lab test positive for detection of COVID-19 virus    Acute with fever continuing intermittent.  Symptoms mainly improved per  patient report, with minimal cough and no SOB.  Had monoclonal antibody treatment on Friday.  Script for Zpack sent in for continued congestion and fever.  Recommend maintaining quarantine until fever and symptom free x 3 days.  Increase hydration and rest at home.  If any SOB or difficulty breathing immediately go to ER.  Return in one week for follow-up.         I discussed the assessment and treatment plan with the patient. The patient was provided an opportunity to ask questions and all were answered. The patient agreed with the plan and demonstrated an understanding of the instructions.   The patient was advised to call back or seek an in-person evaluation if the symptoms worsen or if the condition fails to improve as anticipated.   I provided 15 minutes of time during this encounter.  Follow up plan: Return in about 1 week (around 10/30/2019) for Covid follow-up.

## 2019-10-23 NOTE — Patient Instructions (Signed)

## 2019-10-24 ENCOUNTER — Telehealth: Payer: Medicare HMO | Admitting: Nurse Practitioner

## 2019-10-28 ENCOUNTER — Telehealth: Payer: Self-pay | Admitting: Nurse Practitioner

## 2019-10-28 ENCOUNTER — Other Ambulatory Visit: Payer: Self-pay | Admitting: Nurse Practitioner

## 2019-10-28 MED ORDER — BUPROPION HCL ER (SR) 200 MG PO TB12: 200.0000 mg | ORAL_TABLET | Freq: Two times a day (BID) | ORAL | 3 refills | Status: DC

## 2019-10-28 NOTE — Telephone Encounter (Signed)
PT, Is here stating he has no more refills on medication, please advice. Rx bupropion 90 day supply

## 2019-10-28 NOTE — Telephone Encounter (Signed)
Refills sent

## 2019-10-29 NOTE — Telephone Encounter (Signed)
Pt understood, verbalized understanding.

## 2019-10-31 ENCOUNTER — Ambulatory Visit: Payer: Medicare HMO | Admitting: Nurse Practitioner

## 2019-11-14 ENCOUNTER — Ambulatory Visit (INDEPENDENT_AMBULATORY_CARE_PROVIDER_SITE_OTHER): Payer: Medicare HMO | Admitting: Nurse Practitioner

## 2019-11-14 ENCOUNTER — Encounter: Payer: Self-pay | Admitting: Nurse Practitioner

## 2019-11-14 ENCOUNTER — Other Ambulatory Visit: Payer: Self-pay

## 2019-11-14 DIAGNOSIS — U071 COVID-19: Secondary | ICD-10-CM

## 2019-11-14 NOTE — Assessment & Plan Note (Signed)
Acute and improving.  Symptoms overall improving with minimal SOB and no further cough or fever.  Is back at work at this time.  Continue current medication regimen as needed.  Will have him return to office in February for chronic disease visit and labs.

## 2019-11-14 NOTE — Progress Notes (Addendum)
There were no vitals taken for this visit.   Subjective:    Patient ID: Stuart Harvey, male    DOB: [birth year], 68 y.o.   MRN: [id number]  HPI: Stuart Harvey is a 68 y.o. male  Chief Complaint  Patient presents with  . COVID    pt states this is a follow up from having COVID    This visit was completed via telephone due to the restrictions of the COVID-19 pandemic. All issues as above were discussed and addressed but no physical exam was performed. If it was felt that the patient should be evaluated in the office, they were directed there. The patient verbally consented to this visit. Patient was unable to complete an audio/visual visit due to lack of equipment. Due to the catastrophic nature of the COVID-19 pandemic, this visit was done through audio contact only.  Location of the patient: home  Location of the provider: work  Those involved with this call:  ? Provider: Marnee Guarneri, DNP ? CMA: Yvonna Alanis, CMA ? Front Desk/Registration: Jill Side   Time spent on call: 15 minutes on the phone discussing health concerns. 10 minutes total spent in review of patient's record and preparation of their chart.   I verified patient identity using two factors (patient name and date of birth). Patient consents verbally to being seen via telemedicine visit today.   COVID FOLLOW-UP: He tested positive for Covid on 10/11/2019.   Had monoclonal antibody infusion on the Friday after diagnosis.  Has been taking Tylenol and Advil as needed.  A little loss of taste or smell remains, but improving.  He reports overall he is on the mend. Fever: no, this has improved Cough: no, improved Shortness of breath: yes, only slightly with exertion Wheezing: no Chest pain: no Chest tightness: no Chest congestion: no Nasal congestion: no Runny nose: no Post nasal drip: no Sneezing: no Sore throat: no Swollen glands: no Sinus pressure: no Headache: no Face pain: no Toothache:  no Ear pain: none Ear pressure: none Eyes red/itching:no Eye drainage/crusting: no  Vomiting: no Rash: no Fatigue: yes, slightly Sick contacts: yes Strep contacts: no  Context: stable Recurrent sinusitis: no Relief with OTC cold/cough medications: yes  Treatments attempted: cold/sinus   Relevant past medical, surgical, family and social history reviewed and updated as indicated. Interim medical history since our last visit reviewed. Allergies and medications reviewed and updated.  Review of Systems  Constitutional: Positive for fatigue. Negative for activity change, chills, diaphoresis and fever.  HENT: Negative for congestion, ear discharge, ear pain, postnasal drip, rhinorrhea, sinus pressure, sinus pain, sneezing and sore throat.   Respiratory: Positive for shortness of breath (mild with exertion, but improved). Negative for cough, chest tightness and wheezing.   Cardiovascular: Negative for chest pain, palpitations and leg swelling.  Gastrointestinal: Negative.   Musculoskeletal: Negative for myalgias.  Neurological: Negative.   Psychiatric/Behavioral: Negative.     Per HPI unless specifically indicated above     Objective:    There were no vitals taken for this visit.  Wt Readings from Last 3 Encounters:  09/12/19 216 lb (98 kg)  07/15/19 215 lb (97.5 kg)  06/17/19 217 lb (98.4 kg)    Physical Exam   Unable to perform, telephone visit only.  Results for orders placed or performed during the hospital encounter of 09/10/19  PSA  Result Value Ref Range   Prostatic Specific Antigen 1.20 0.00 - 4.00 ng/mL      Assessment & Plan:  Problem List Items Addressed This Visit      Other   Lab test positive for detection of COVID-19 virus    Acute and improving.  Symptoms overall improving with minimal SOB and no further cough or fever.  Is back at work at this time.  Continue current medication regimen as needed.  Will have him return to office in February for  chronic disease visit and labs.         I discussed the assessment and treatment plan with the patient. The patient was provided an opportunity to ask questions and all were answered. The patient agreed with the plan and demonstrated an understanding of the instructions.   The patient was advised to call back or seek an in-person evaluation if the symptoms worsen or if the condition fails to improve as anticipated.   I provided 15 minutes of time during this encounter.  Follow up plan: Return in about 6 weeks (around 12/26/2019) for HTN/HLD, CKD, AAA, Hypothyroid, Anemia, Mood.

## 2019-11-14 NOTE — Patient Instructions (Signed)
COVID-19 COVID-19 is a respiratory infection that is caused by a virus called severe acute respiratory syndrome coronavirus 2 (SARS-CoV-2). The disease is also known as coronavirus disease or novel coronavirus. In some people, the virus may not cause any symptoms. In others, it may cause a serious infection. The infection can get worse quickly and can lead to complications, such as:  Pneumonia, or infection of the lungs.  Acute respiratory distress syndrome or ARDS. This is a condition in which fluid build-up in the lungs prevents the lungs from filling with air and passing oxygen into the blood.  Acute respiratory failure. This is a condition in which there is not enough oxygen passing from the lungs to the body or when carbon dioxide is not passing from the lungs out of the body.  Sepsis or septic shock. This is a serious bodily reaction to an infection.  Blood clotting problems.  Secondary infections due to bacteria or fungus.  Organ failure. This is when your body's organs stop working. The virus that causes COVID-19 is contagious. This means that it can spread from person to person through droplets from coughs and sneezes (respiratory secretions). What are the causes? This illness is caused by a virus. You may catch the virus by:  Breathing in droplets from an infected person. Droplets can be spread by a person breathing, speaking, singing, coughing, or sneezing.  Touching something, like a table or a doorknob, that was exposed to the virus (contaminated) and then touching your mouth, nose, or eyes. What increases the risk? Risk for infection You are more likely to be infected with this virus if you:  Are within 6 feet (2 meters) of a person with COVID-19.  Provide care for or live with a person who is infected with COVID-19.  Spend time in crowded indoor spaces or live in shared housing. Risk for serious illness You are more likely to become seriously ill from the virus if you:   Are 50 years of age or older. The higher your age, the more you are at risk for serious illness.  Live in a nursing home or long-term care facility.  Have cancer.  Have a long-term (chronic) disease such as: ? Chronic lung disease, including chronic obstructive pulmonary disease or asthma. ? A long-term disease that lowers your body's ability to fight infection (immunocompromised). ? Heart disease, including heart failure, a condition in which the arteries that lead to the heart become narrow or blocked (coronary artery disease), a disease which makes the heart muscle thick, weak, or stiff (cardiomyopathy). ? Diabetes. ? Chronic kidney disease. ? Sickle cell disease, a condition in which red blood cells have an abnormal "sickle" shape. ? Liver disease.  Are obese. What are the signs or symptoms? Symptoms of this condition can range from mild to severe. Symptoms may appear any time from 2 to 14 days after being exposed to the virus. They include:  A fever or chills.  A cough.  Difficulty breathing.  Headaches, body aches, or muscle aches.  Runny or stuffy (congested) nose.  A sore throat.  New loss of taste or smell. Some people may also have stomach problems, such as nausea, vomiting, or diarrhea. Other people may not have any symptoms of COVID-19. How is this diagnosed? This condition may be diagnosed based on:  Your signs and symptoms, especially if: ? You live in an area with a COVID-19 outbreak. ? You recently traveled to or from an area where the virus is common. ? You   provide care for or live with a person who was diagnosed with COVID-19. ? You were exposed to a person who was diagnosed with COVID-19.  A physical exam.  Lab tests, which may include: ? Taking a sample of fluid from the back of your nose and throat (nasopharyngeal fluid), your nose, or your throat using a swab. ? A sample of mucus from your lungs (sputum). ? Blood tests.  Imaging tests, which  may include, X-rays, CT scan, or ultrasound. How is this treated? At present, there is no medicine to treat COVID-19. Medicines that treat other diseases are being used on a trial basis to see if they are effective against COVID-19. Your health care provider will talk with you about ways to treat your symptoms. For most people, the infection is mild and can be managed at home with rest, fluids, and over-the-counter medicines. Treatment for a serious infection usually takes places in a hospital intensive care unit (ICU). It may include one or more of the following treatments. These treatments are given until your symptoms improve.  Receiving fluids and medicines through an IV.  Supplemental oxygen. Extra oxygen is given through a tube in the nose, a face mask, or a hood.  Positioning you to lie on your stomach (prone position). This makes it easier for oxygen to get into the lungs.  Continuous positive airway pressure (CPAP) or bi-level positive airway pressure (BPAP) machine. This treatment uses mild air pressure to keep the airways open. A tube that is connected to a motor delivers oxygen to the body.  Ventilator. This treatment moves air into and out of the lungs by using a tube that is placed in your windpipe.  Tracheostomy. This is a procedure to create a hole in the neck so that a breathing tube can be inserted.  Extracorporeal membrane oxygenation (ECMO). This procedure gives the lungs a chance to recover by taking over the functions of the heart and lungs. It supplies oxygen to the body and removes carbon dioxide. Follow these instructions at home: Lifestyle  If you are sick, stay home except to get medical care. Your health care provider will tell you how long to stay home. Call your health care provider before you go for medical care.  Rest at home as told by your health care provider.  Do not use any products that contain nicotine or tobacco, such as cigarettes, e-cigarettes, and  chewing tobacco. If you need help quitting, ask your health care provider.  Return to your normal activities as told by your health care provider. Ask your health care provider what activities are safe for you. General instructions  Take over-the-counter and prescription medicines only as told by your health care provider.  Drink enough fluid to keep your urine pale yellow.  Keep all follow-up visits as told by your health care provider. This is important. How is this prevented?  There is no vaccine to help prevent COVID-19 infection. However, there are steps you can take to protect yourself and others from this virus. To protect yourself:   Do not travel to areas where COVID-19 is a risk. The areas where COVID-19 is reported change often. To identify high-risk areas and travel restrictions, check the CDC travel website: wwwnc.cdc.gov/travel/notices  If you live in, or must travel to, an area where COVID-19 is a risk, take precautions to avoid infection. ? Stay away from people who are sick. ? Wash your hands often with soap and water for 20 seconds. If soap and water   are not available, use an alcohol-based hand sanitizer. ? Avoid touching your mouth, face, eyes, or nose. ? Avoid going out in public, follow guidance from your state and local health authorities. ? If you must go out in public, wear a cloth face covering or face mask. Make sure your mask covers your nose and mouth. ? Avoid crowded indoor spaces. Stay at least 6 feet (2 meters) away from others. ? Disinfect objects and surfaces that are frequently touched every day. This may include:  Counters and tables.  Doorknobs and light switches.  Sinks and faucets.  Electronics, such as phones, remote controls, keyboards, computers, and tablets. To protect others: If you have symptoms of COVID-19, take steps to prevent the virus from spreading to others.  If you think you have a COVID-19 infection, contact your health care  provider right away. Tell your health care team that you think you may have a COVID-19 infection.  Stay home. Leave your house only to seek medical care. Do not use public transport.  Do not travel while you are sick.  Wash your hands often with soap and water for 20 seconds. If soap and water are not available, use alcohol-based hand sanitizer.  Stay away from other members of your household. Let healthy household members care for children and pets, if possible. If you have to care for children or pets, wash your hands often and wear a mask. If possible, stay in your own room, separate from others. Use a different bathroom.  Make sure that all people in your household wash their hands well and often.  Cough or sneeze into a tissue or your sleeve or elbow. Do not cough or sneeze into your hand or into the air.  Wear a cloth face covering or face mask. Make sure your mask covers your nose and mouth. Where to find more information  Centers for Disease Control and Prevention: www.cdc.gov/coronavirus/2019-ncov/index.html  World Health Organization: www.who.int/health-topics/coronavirus Contact a health care provider if:  You live in or have traveled to an area where COVID-19 is a risk and you have symptoms of the infection.  You have had contact with someone who has COVID-19 and you have symptoms of the infection. Get help right away if:  You have trouble breathing.  You have pain or pressure in your chest.  You have confusion.  You have bluish lips and fingernails.  You have difficulty waking from sleep.  You have symptoms that get worse. These symptoms may represent a serious problem that is an emergency. Do not wait to see if the symptoms will go away. Get medical help right away. Call your local emergency services (911 in the [place] Do not drive yourself to the hospital. Let the emergency medical personnel know if you think you have COVID-19. Summary  COVID-19 is a  respiratory infection that is caused by a virus. It is also known as coronavirus disease or novel coronavirus. It can cause serious infections, such as pneumonia, acute respiratory distress syndrome, acute respiratory failure, or sepsis.  The virus that causes COVID-19 is contagious. This means that it can spread from person to person through droplets from breathing, speaking, singing, coughing, or sneezing.  You are more likely to develop a serious illness if you are 50 years of age or older, have a weak immune system, live in a nursing home, or have chronic disease.  There is no medicine to treat COVID-19. Your health care provider will talk with you about ways to treat your symptoms.    Take steps to protect yourself and others from infection. Wash your hands often and disinfect objects and surfaces that are frequently touched every day. Stay away from people who are sick and wear a mask if you are sick. This information is not intended to replace advice given to you by your health care provider. Make sure you discuss any questions you have with your health care provider. Document Revised: 08/30/2019 Document Reviewed: 12/06/2018 [employer] Patient Education  2020 [employer]  

## 2019-11-17 ENCOUNTER — Other Ambulatory Visit: Payer: Self-pay | Admitting: Cardiovascular Disease

## 2019-11-18 NOTE — Telephone Encounter (Signed)
Refill Request.  

## 2019-11-18 NOTE — Telephone Encounter (Signed)
Pt's age 69, wt 78 kg, SCr 1.31, CrCl 74.81, last ov w/ RD 04/19/19.

## 2019-11-21 NOTE — Progress Notes (Deleted)
{Choose 1 Note Type (Telehealth Visit or Telephone Visit):[id number]}   Date:  11/21/2019   ID:  Stuart Harvey, DOB [birth year], MRN [id number]  {Patient Location:[id number]::"Home"} {Provider Location:[phone]::"Home"}  PCP:  Venita Lick, NP  Cardiologist:  [name], MD  Electrophysiologist:  None   Evaluation Performed:  Follow-Up Visit  Chief Complaint:  Follow up  History of Present Illness:    Stuart Harvey is a 69 y.o. male with ***  ***   Labs independently reviewed: 05/2019 -TSH elevated 4.720, total T4 normal, BUN 29, Theragran 1.31, potassium 4.6, albumin 4.4, AST/ALT normal, Hgb 14.9, PLT 160, TC 155, TG 81, HDL 49, LDL 90  The patient {does/does not:200015} have symptoms concerning for COVID-19 infection (fever, chills, cough, or new shortness of breath).    Past Medical History:  Diagnosis Date  . AAA (abdominal aortic aneurysm) ([place])    s/p surgical repair in 08/2007 by Dr. Hulda Humphrey  . Anemia   . Arthritis    both knees  . CKD (chronic kidney disease), stage II   . Coronary artery disease    a. 4-vCABG @ [place] 06/2007 (LIMA-LAD, VG-RPDA, VG-OM3,VG-D2; b. LHC 2011: patent grafts EF 40%, c. LHC 06/05/18: LM nl, pLAD 50%, p-mLAD 50%, ostD1 70%, ost ramus 30%, ost-pLCx 40%, pLCx 100% (chronically occluded), OM3 100%, p-dRCA 100% (chronically occluded), LIMA-LAD patent, VG-RPDA patent, VG-OM3 mid-graft 60% and 80%  . Depression   . GERD (gastroesophageal reflux disease)   . Hernia of abdominal cavity   . Hyperlipidemia   . Hypertension   . Hypothyroidism    Past Surgical History:  Procedure Laterality Date  . ABDOMINAL AORTIC ANEURYSM REPAIR  08/2007  . ABDOMINAL AORTIC ANEURYSM REPAIR  11/2012   [facility]  . CARDIAC CATHETERIZATION  2008, 2011   [facility]   . CORONARY ARTERY BYPASS GRAFT  2008  . CORONARY/GRAFT ANGIOGRAPHY N/A 06/05/2018   Procedure: CORONARY/GRAFT ANGIOGRAPHY;  Surgeon: Nelva Bush, MD;  Location: [facility];  Service:  Cardiovascular;  Laterality: N/A;  . EXPLORATORY LAPAROTOMY  1997  . HERNIA REPAIR    . JOINT REPLACEMENT     right knee  . KNEE ARTHROPLASTY Left 09/19/2016   Procedure: COMPUTER ASSISTED TOTAL KNEE ARTHROPLASTY;  Surgeon: Dereck Leep, MD;  Location: [facility] ORS;  Service: Orthopedics;  Laterality: Left;  . TOTAL KNEE ARTHROPLASTY Right 09/2016     No outpatient medications have been marked as taking for the 11/22/19 encounter (Appointment) with Rise Mu, PA-C.     Allergies:   Benzodiazepines, Codeine, and Tetracycline   Social History   Tobacco Use  . Smoking status: Former Smoker    Packs/day: 1.00    Years: 35.00    Pack years: 35.00    Types: Cigarettes    Quit date: 12/16/2005    Years since quitting: 13.9  . Smokeless tobacco: Current User    Types: Chew  . Tobacco comment: pt states he very rarely uses smokeless tobacco   Substance Use Topics  . Alcohol use: No  . Drug use: No     Family Hx: The patient's family history includes Depression in his maternal grandfather and maternal grandmother; Heart attack in his father, mother, and another family member; Heart disease in his mother and another family member; Hypertension in his father and mother; Kidney disease in his brother. There is no history of Prostate cancer, Kidney cancer, Stomach cancer, or Colon cancer.  ROS:   Please see the history of present illness.  All other systems reviewed and are negative.   Prior CV studies:   The following studies were reviewed today:  LHC 05/2018: Conclusions: 1. Severe native coronary artery disease with subtotal/total occlusions of the proximal/mid LAD, proximal LCx, and proximal RCA. 2. Widely patent LIMA to LAD, SVG to diagonal, and SVG to RPDA. 3. Patent but diffusely diseased SVG to OM3.  There are sequential focal 60% and 80% stenoses in the middle of the graft.  Intervention was not attempted due to the patient's inability to remain still on the table and  consistently follow commands, as well as recent acute kidney injury in the setting of rhabdomyolysis.  Additionally, this graft supplies a relatively small territory.  Recommendations 1. Continue medical therapy and secondary prevention.  If platelets stabilize, recommend adding clopidogrel 75 mg daily to be continued at least 12 months. 2. If patient has signs/symptoms of ischemia or recurrent ventricular tachycardia, consider PCI to SVG to OM3 once mental status improves.  Recommend uninterrupted dual antiplatelet therapy with Aspirin 81mg  daily and Clopidogrel 75mg  daily for a minimum of 12 months (ACS - Class I recommendation).  Clopidogrel will be added once platelet count has stabilized. __________  2D echo 05/2018: - Left ventricle: The cavity size was mildly dilated. Systolic   function was normal. The estimated ejection fraction was in the   range of 50% to 55%. Challenging images though grossly normal   Wall motion; there were grossly no significant regional wall   motion abnormalities. Left ventricular diastolic function   parameters were normal. - Ascending aorta: The ascending aorta was moderately dilated. 4.4   cm - Left atrium: The appendage was mildly to moderately dilated. - Right ventricle: Systolic function was normal. - Pulmonary arteries: PA peak pressure: 44 mm Hg (S).  Labs/Other Tests and Data Reviewed:    EKG:  {EKG/Telemetry Strips Reviewed:[phone]}  Recent Labs: 05/22/2019: ALT 12; BUN 29; Creatinine, Ser 1.31; Hemoglobin 14.9; Platelets 160; Potassium 4.6; Sodium 143; TSH 4.720   Recent Lipid Panel Lab Results  Component Value Date/Time   CHOL 155 05/22/2019 11:49 AM   CHOL 159 12/18/2015 08:27 AM   TRIG 81 05/22/2019 11:49 AM   TRIG 166 (H) 12/18/2015 08:27 AM   HDL 49 05/22/2019 11:49 AM   CHOLHDL 3.4 09/25/2018 12:08 PM   LDLCALC 90 05/22/2019 11:49 AM    Wt Readings from Last 3 Encounters:  09/12/19 216 lb (98 kg)  07/15/19 215 lb (97.5 kg)   06/17/19 217 lb (98.4 kg)     Objective:    Vital Signs:  There were no vitals taken for this visit.   {[facility] Virtual Exam (Optional):[phone]::"VITAL SIGNS:  reviewed"}  ASSESSMENT & PLAN:    1. ***  COVID-19 Education: The signs and symptoms of COVID-19 were discussed with the patient and how to seek care for testing (follow up with PCP or arrange E-visit).  The importance of social distancing was discussed today.  Time:   Today, I have spent *** minutes with the patient with telehealth technology discussing the above problems.     Medication Adjustments/Labs and Tests Ordered: Current medicines are reviewed at length with the patient today.  Concerns regarding medicines are outlined above.   Tests Ordered: No orders of the defined types were placed in this encounter.   Medication Changes: No orders of the defined types were placed in this encounter.   Follow Up:  {F/U Format:[phone]} {follow up:15908}  Signed, Christell Faith, PA-C  11/21/2019 7:45 AM  [facility]

## 2019-11-22 ENCOUNTER — Telehealth: Payer: Medicare HMO | Admitting: Family

## 2019-11-22 NOTE — Progress Notes (Deleted)
Cardiology Office Note    Date:  11/22/2019   ID:  CLIM PAWLOSKI, DOB 1951-06-19, MRN [id number]  PCP:  Venita Lick, NP  Cardiologist:  [name], MD  Electrophysiologist:  None   Chief Complaint: Follow-up  History of Present Illness:   Stuart Harvey is a 69 y.o. male with history of CAD s/p CABG in 06/2007 at [facility], large infrarenal AAA s/p surgical repair in 08/2007 now followed by Dr. Lucky Cowboy, PAF on Eliquis, CKD stage II, recent COVID-19 infection in 09/2019, HTN, HLD, hypothyroidism, anemia, depression, and GERD who presents for follow-up of his CAD and PAF.  Patient underwent 4-vessel CABG at [facility] in 06/2007 with LIMA-LAD, SVG-diagonal, SVG-OM3, SVG-rPDA. Following this, he underwent open surgical repair of his aortic aneurysm in 08/2007. Abnormal stress test in 2011 with subsequent LHC showing patent grafts with an EF of 40%. Follow up stress test in 2013 showed evidence of prior inferolateral infarct with some peri-infarct ischemia and a normal EF. This was essentially unchanged when compared to his prior stress test. Echo in 08/2015 showed an EF of 50-55% with no significant valvular abnormalities. He was admitted in 05/2018 with unresponsiveness and was reportedly down for approximately 1 hour with possible seizure-like activity. Per EMS, he was in VT upon their arrival and was cardioverted. He was briefly intubated. Echo showed an EF of 50-55%. He underwent LHC which showed severe underlying 3-vessel CAD with patent grafts including LIMA-LAD, SVG-diagonal, SVG-OM3, SVG-RCA. There was borderline disease in the SVG-OM but was left to be treated medically given this supplied a small area and the patient was recovering from acute renal failure along with AMS. His hospitalization was also complicated by the development of Afib with spontaneous conversion to sinus rhythm prior to planned cardioversion. His presentation was felt to possibly triggered by heat stroke.  Most recent abdominal  ultrasound from 06/2019 showed patent endovascular aneurysm repair with no evidence of endoleak with the largest diameter measured at 3.71 cm with a prior measurement of 3.45 cm and is followed by vascular surgery with no indication for intervention at most recent visit in 06/2019.    He was last seen by cardiology virtually in 04/2019 and doing well from a cardiac perspective.  He was diagnosed with COVID-19 infection on 10/11/2019 and underwent monoclonal antibody infusion on 10/18/2019.  He has subsequently followed up with his PCP.  ***   Labs independently reviewed: 05/2019 - TSH elevated at 4.720, total T4 normal at 7.1, BUN 29, serum creatinine 1.31, potassium 4.6, albumin 4.4, AST/ALT normal, Hgb 14.9, PLT 160, TC 155, TG 81, HDL 49, LDL 90  Past Medical History:  Diagnosis Date  . AAA (abdominal aortic aneurysm) ([place])    s/p surgical repair in 08/2007 by Dr. Hulda Humphrey  . Anemia   . Arthritis    both knees  . CKD (chronic kidney disease), stage II   . Coronary artery disease    a. 4-vCABG @ [place] 06/2007 (LIMA-LAD, VG-RPDA, VG-OM3,VG-D2; b. LHC 2011: patent grafts EF 40%, c. LHC 06/05/18: LM nl, pLAD 50%, p-mLAD 50%, ostD1 70%, ost ramus 30%, ost-pLCx 40%, pLCx 100% (chronically occluded), OM3 100%, p-dRCA 100% (chronically occluded), LIMA-LAD patent, VG-RPDA patent, VG-OM3 mid-graft 60% and 80%  . Depression   . GERD (gastroesophageal reflux disease)   . Hernia of abdominal cavity   . Hyperlipidemia   . Hypertension   . Hypothyroidism     Past Surgical History:  Procedure Laterality Date  . ABDOMINAL AORTIC ANEURYSM REPAIR  08/2007  . ABDOMINAL AORTIC ANEURYSM REPAIR  11/2012   [facility]  . CARDIAC CATHETERIZATION  2008, 2011   [facility]   . CORONARY ARTERY BYPASS GRAFT  2008  . CORONARY/GRAFT ANGIOGRAPHY N/A 06/05/2018   Procedure: CORONARY/GRAFT ANGIOGRAPHY;  Surgeon: Nelva Bush, MD;  Location: [facility];  Service: Cardiovascular;  Laterality: N/A;  . EXPLORATORY  LAPAROTOMY  1997  . HERNIA REPAIR    . JOINT REPLACEMENT     right knee  . KNEE ARTHROPLASTY Left 09/19/2016   Procedure: COMPUTER ASSISTED TOTAL KNEE ARTHROPLASTY;  Surgeon: Dereck Leep, MD;  Location: [facility];  Service: Orthopedics;  Laterality: Left;  . TOTAL KNEE ARTHROPLASTY Right 09/2016    Current Medications: No outpatient medications have been marked as taking for the 11/25/19 encounter (Appointment) with Rise Mu, PA-C.    Allergies:   Benzodiazepines, Codeine, and Tetracycline   Social History   Socioeconomic History  . Marital status: Married    Spouse name: Not on file  . Number of children: Not on file  . Years of education: 48  . Highest education level: Some college, no degree  Occupational History  . Occupation: retired  Tobacco Use  . Smoking status: Former Smoker    Packs/day: 1.00    Years: 35.00    Pack years: 35.00    Types: Cigarettes    Quit date: 12/16/2005    Years since quitting: 13.9  . Smokeless tobacco: Current User    Types: Chew  . Tobacco comment: pt states he very rarely uses smokeless tobacco   Substance and Sexual Activity  . Alcohol use: No  . Drug use: No  . Sexual activity: Yes    Partners: Female  Other Topics Concern  . Not on file  Social History Narrative  . Not on file   Social Determinants of Health   Financial Resource Strain:   . Difficulty of Paying Living Expenses: Not on file  Food Insecurity:   . Worried About Charity fundraiser in the Last Year: Not on file  . Ran Out of Food in the Last Year: Not on file  Transportation Needs:   . Lack of Transportation (Medical): Not on file  . Lack of Transportation (Non-Medical): Not on file  Physical Activity:   . Days of Exercise per Week: Not on file  . Minutes of Exercise per Session: Not on file  Stress:   . Feeling of Stress : Not on file  Social Connections:   . Frequency of Communication with Friends and Family: Not on file  . Frequency of Social  Gatherings with Friends and Family: Not on file  . Attends Religious Services: Not on file  . Active Member of Clubs or Organizations: Not on file  . Attends Archivist Meetings: Not on file  . Marital Status: Not on file     Family History:  The patient's family history includes Depression in his maternal grandfather and maternal grandmother; Heart attack in his father, mother, and another family member; Heart disease in his mother and another family member; Hypertension in his father and mother; Kidney disease in his brother. There is no history of Prostate cancer, Kidney cancer, Stomach cancer, or Colon cancer.  ROS:   ROS   EKGs/Labs/Other Studies Reviewed:    Studies reviewed were summarized above. The additional studies were reviewed today:  LHC 05/2018: Conclusions: 1. Severe native coronary artery disease with subtotal/total occlusions of the proximal/mid LAD, proximal LCx, and proximal  RCA. 2. Widely patent LIMA to LAD, SVG to diagonal, and SVG to RPDA. 3. Patent but diffusely diseased SVG to OM3.  There are sequential focal 60% and 80% stenoses in the middle of the graft.  Intervention was not attempted due to the patient's inability to remain still on the table and consistently follow commands, as well as recent acute kidney injury in the setting of rhabdomyolysis.  Additionally, this graft supplies a relatively small territory.  Recommendations: 1. Continue medical therapy and secondary prevention.  If platelets stabilize, recommend adding clopidogrel 75 mg daily to be continued at least 12 months. 2. If patient has signs/symptoms of ischemia or recurrent ventricular tachycardia, consider PCI to SVG to OM3 once mental status improves.  Recommend uninterrupted dual antiplatelet therapy with Aspirin 81mg  daily and Clopidogrel 75mg  daily for a minimum of 12 months (ACS - Class I recommendation).  Clopidogrel will be added once platelet count has  stabilized. __________  2D echo 05/2018: - Left ventricle: The cavity size was mildly dilated. Systolic   function was normal. The estimated ejection fraction was in the   range of 50% to 55%. Challenging images though grossly normal   Wall motion; there were grossly no significant regional wall   motion abnormalities. Left ventricular diastolic function   parameters were normal. - Ascending aorta: The ascending aorta was moderately dilated. 4.4   cm - Left atrium: The appendage was mildly to moderately dilated. - Right ventricle: Systolic function was normal. - Pulmonary arteries: PA peak pressure: 44 mm Hg (S).   EKG:  EKG is ordered today.  The EKG ordered today demonstrates ***  Recent Labs: 05/22/2019: ALT 12; BUN 29; Creatinine, Ser 1.31; Hemoglobin 14.9; Platelets 160; Potassium 4.6; Sodium 143; TSH 4.720  Recent Lipid Panel    Component Value Date/Time   CHOL 155 05/22/2019 1149   CHOL 159 12/18/2015 0827   TRIG 81 05/22/2019 1149   TRIG 166 (H) 12/18/2015 0827   HDL 49 05/22/2019 1149   CHOLHDL 3.4 09/25/2018 1208   VLDL 31 09/25/2018 1208   VLDL 33 (H) 12/18/2015 0827   LDLCALC 90 05/22/2019 1149    PHYSICAL EXAM:    VS:  There were no vitals taken for this visit.  BMI: There is no height or weight on file to calculate BMI.  Physical Exam  Wt Readings from Last 3 Encounters:  09/12/19 216 lb (98 kg)  07/15/19 215 lb (97.5 kg)  06/17/19 217 lb (98.4 kg)     ASSESSMENT & PLAN:   1. ***  Disposition: F/u with Dr. Fletcher Anon or an APP in ***.   Medication Adjustments/Labs and Tests Ordered: Current medicines are reviewed at length with the patient today.  Concerns regarding medicines are outlined above. Medication changes, Labs and Tests ordered today are summarized above and listed in the Patient Instructions accessible in Encounters.   Signed, Christell Faith, PA-C 11/22/2019 11:11 AM     [address] [place], [place]  [phone]

## 2019-11-25 ENCOUNTER — Ambulatory Visit: Payer: Medicare HMO | Admitting: Physician Assistant

## 2019-11-27 ENCOUNTER — Ambulatory Visit: Payer: Medicare HMO | Admitting: Family

## 2019-11-27 NOTE — Progress Notes (Signed)
Cardiology Office Note    Date:  12/02/2019   ID:  Stuart Harvey, DOB 17-Feb-1951, MRN [id number]  PCP:  Stuart Maple, MD  Cardiologist:  Stuart Sacramento, MD  Electrophysiologist:  None   Chief Complaint: Follow-up  History of Present Illness:   Stuart Harvey is a 69 y.o. male with history of CAD s/p CABG in 06/2007 at [facility], large infrarenal AAA s/p surgical repair in 08/2007 now followed by Stuart Harvey, PAF on Eliquis, CKD stage II, recent COVID-19 infection in 09/2019, HTN, HLD, hypothyroidism, anemia, depression, and GERD who presents for follow-up of his CAD and PAF.  Patient underwent 4-vessel CABG at [facility] in 06/2007 with LIMA-LAD, SVG-diagonal, SVG-OM3, SVG-rPDA. Following this, he underwent surgical repair of his aortic aneurysm in 08/2007. Abnormal stress test in 2011 with subsequent LHC showing patent grafts with an EF of 40%. Follow up stress test in 2013 showed evidence of prior inferolateral infarct with some peri-infarct ischemia and a normal EF. This was essentially unchanged when compared to his prior stress test. Echo in 08/2015 showed an EF of 50-55% with no significant valvular abnormalities. He was admitted in 05/2018 with unresponsiveness and was reportedly down for approximately 1 hour with possible seizure-like activity. Per EMS, he was in VT upon their arrival and was cardioverted. He was briefly intubated. Echo showed an EF of 50-55%. He underwent LHC which showed severe underlying 3-vessel CAD with patent grafts including LIMA-LAD, SVG-diagonal, SVG-OM3, SVG-RCA. There was borderline disease in the SVG-OM but was left to be treated medically given this supplied a small area and the patient was recovering from acute renal failure along with AMS. His hospitalization was also complicated by the development of Afib with spontaneous conversion to sinus rhythm prior to planned cardioversion. His presentation was felt to possibly triggered by heat stroke.  Most recent abdominal  ultrasound from 06/2019 showed patent endovascular aneurysm repair with no evidence of endoleak with the largest diameter measured at 3.71 cm with a prior measurement of 3.45 cm and is followed by vascular surgery with no indication for intervention at most recent visit in 06/2019.    He was last seen by cardiology virtually in 04/2019 and doing well from a cardiac perspective.  He was diagnosed with COVID-19 infection on 10/11/2019 and underwent monoclonal antibody infusion on 10/18/2019.  He has subsequently followed up with his PCP.  He comes in doing well from a cardiac perspective.  He denies any chest pain, shortness of breath, palpitations, dizziness, presyncope, syncope, lower extremity swelling, abdominal distention, orthopnea, PND, or early satiety.  No falls, BRBPR, or melena.  He is tolerating anticoagulation without any issues.  His main concern today is if he would be able to go back to church following his recent Covid infection in 09/2019.   Labs independently reviewed: 05/2019 - TSH elevated at 4.720, total T4 normal at 7.1, BUN 29, serum creatinine 1.31, potassium 4.6, albumin 4.4, AST/ALT normal, Hgb 14.9, PLT 160, TC 155, TG 81, HDL 49, LDL 90   Past Medical History:  Diagnosis Date   AAA (abdominal aortic aneurysm) ([place])    s/p surgical repair in 08/2007 by Stuart Harvey   Anemia    Arthritis    both knees   CKD (chronic kidney disease), stage II    Coronary artery disease    a. 4-vCABG @ [place] 06/2007 (LIMA-LAD, VG-RPDA, VG-OM3,VG-D2; b. [place] 2011: patent grafts EF 40%, c. LHC 06/05/18: LM nl, pLAD 50%, p-mLAD 50%, ostD1 70%, ost ramus  30%, ost-pLCx 40%, pLCx 100% (chronically occluded), OM3 100%, p-dRCA 100% (chronically occluded), LIMA-LAD patent, VG-RPDA patent, VG-OM3 mid-graft 60% and 80%   Depression    GERD (gastroesophageal reflux disease)    Hernia of abdominal cavity    Hyperlipidemia    Hypertension    Hypothyroidism     Past Surgical History:    Procedure Laterality Date   ABDOMINAL AORTIC ANEURYSM REPAIR  08/2007   ABDOMINAL AORTIC ANEURYSM REPAIR  11/2012   [facility]   CARDIAC CATHETERIZATION  2008, 2011   [facility]    CORONARY ARTERY BYPASS GRAFT  2008   CORONARY/GRAFT ANGIOGRAPHY N/A 06/05/2018   Procedure: CORONARY/GRAFT ANGIOGRAPHY;  Surgeon: Stuart Bush, MD;  Location: [facility];  Service: Cardiovascular;  Laterality: N/A;   EXPLORATORY LAPAROTOMY  1997   HERNIA REPAIR     JOINT REPLACEMENT     right knee   KNEE ARTHROPLASTY Left 09/19/2016   Procedure: COMPUTER ASSISTED TOTAL KNEE ARTHROPLASTY;  Surgeon: Dereck Leep, MD;  Location: [facility] ORS;  Service: Orthopedics;  Laterality: Left;   TOTAL KNEE ARTHROPLASTY Right 09/2016    Current Medications: Current Meds  Medication Sig   buPROPion (WELLBUTRIN SR) 200 MG 12 hr tablet Take 1 tablet (200 mg total) by mouth 2 (two) times daily.   Cholecalciferol (D3 SUPER STRENGTH) 2000 UNITS CAPS Take 1 capsule by mouth daily.    citalopram (CELEXA) 40 MG tablet Take 1 tablet (40 mg total) by mouth daily.   ELIQUIS 5 MG TABS tablet Take 1 tablet (5 mg total) by mouth 2 (two) times daily.   levothyroxine (SYNTHROID) 50 MCG tablet Take 1 tablet (50 mcg total) by mouth daily.   metoprolol tartrate (LOPRESSOR) 25 MG tablet Take 0.5 tablets (12.5 mg total) by mouth 2 (two) times daily.   omeprazole (PRILOSEC) 20 MG capsule Take 1 capsule (20 mg total) by mouth daily.   rosuvastatin (CRESTOR) 40 MG tablet Take 1 tablet (40 mg total) by mouth daily.   tadalafil (CIALIS) 5 MG tablet Take 1 tablet (5 mg total) by mouth daily as needed for erectile dysfunction.   tamsulosin (FLOMAX) 0.4 MG CAPS capsule Take 1 capsule (0.4 mg total) by mouth daily.    Allergies:   Benzodiazepines, Codeine, and Tetracycline   Social History   Socioeconomic History   Marital status: Married    Spouse name: Not on file   Number of children: Not on file   Years of education:  14   Highest education level: Some college, no degree  Occupational History   Occupation: retired  Tobacco Use   Smoking status: Former Smoker    Packs/day: 1.00    Years: 35.00    Pack years: 35.00    Types: Cigarettes    Quit date: 12/16/2005    Years since quitting: 13.9   Smokeless tobacco: Current User    Types: Chew   Tobacco comment: pt states he very rarely uses smokeless tobacco   Substance and Sexual Activity   Alcohol use: No   Drug use: No   Sexual activity: Yes    Partners: Female  Other Topics Concern   Not on file  Social History Narrative   Not on file   Social Determinants of Health   Financial Resource Strain:    Difficulty of Paying Living Expenses: Not on file  Food Insecurity:    Worried About [place] in the Last Year: Not on file   [place] in the Last  Year: Not on file  Transportation Needs:    Lack of Transportation (Medical): Not on file   Lack of Transportation (Non-Medical): Not on file  Physical Activity:    Days of Exercise per Week: Not on file   Minutes of Exercise per Session: Not on file  Stress:    Feeling of Stress : Not on file  Social Connections:    Frequency of Communication with Friends and Family: Not on file   Frequency of Social Gatherings with Friends and Family: Not on file   Attends Religious Services: Not on file   Active Member of Clubs or Organizations: Not on file   Attends Archivist Meetings: Not on file   Marital Status: Not on file     Family History:  The patient's family history includes Depression in his maternal grandfather and maternal grandmother; Heart attack in his father, mother, and another family member; Heart disease in his mother and another family member; Hypertension in his father and mother; Kidney disease in his brother. There is no history of Prostate cancer, Kidney cancer, Stomach cancer, or Colon cancer.  ROS:   Review of Systems    Constitutional: Positive for malaise/fatigue. Negative for chills, diaphoresis, fever and weight loss.  HENT: Negative for congestion.   Eyes: Negative for discharge and redness.  Respiratory: Negative for cough, hemoptysis, sputum production, shortness of breath and wheezing.   Cardiovascular: Negative for chest pain, palpitations, orthopnea, claudication, leg swelling and PND.  Gastrointestinal: Negative for abdominal pain, blood in stool, heartburn, melena, nausea and vomiting.  Genitourinary: Negative for hematuria.  Musculoskeletal: Negative for falls and myalgias.  Skin: Negative for rash.  Neurological: Negative for dizziness, tingling, tremors, sensory change, speech change, focal weakness, loss of consciousness and weakness.  Endo/Heme/Allergies: Does not bruise/bleed easily.  Psychiatric/Behavioral: Negative for substance abuse. The patient is not nervous/anxious.   All other systems reviewed and are negative.    EKGs/Labs/Other Studies Reviewed:    Studies reviewed were summarized above. The additional studies were reviewed today:  LHC 05/2018: Conclusions: 1. Severe native coronary artery disease with subtotal/total occlusions of the proximal/mid LAD, proximal LCx, and proximal RCA. 2. Widely patent LIMA to LAD, SVG to diagonal, and SVG to RPDA. 3. Patent but diffusely diseased SVG to OM3.  There are sequential focal 60% and 80% stenoses in the middle of the graft.  Intervention was not attempted due to the patient's inability to remain still on the table and consistently follow commands, as well as recent acute kidney injury in the setting of rhabdomyolysis.  Additionally, this graft supplies a relatively small territory.  Recommendations: 1. Continue medical therapy and secondary prevention.  If platelets stabilize, recommend adding clopidogrel 75 mg daily to be continued at least 12 months. 2. If patient has signs/symptoms of ischemia or recurrent ventricular tachycardia,  consider PCI to SVG to OM3 once mental status improves.  Recommend uninterrupted dual antiplatelet therapy with Aspirin 81mg  daily and Clopidogrel 75mg  daily for a minimum of 12 months (ACS - Class I recommendation).  Clopidogrel will be added once platelet count has stabilized. __________  2D echo 05/2018: - Left ventricle: The cavity size was mildly dilated. Systolic   function was normal. The estimated ejection fraction was in the   range of 50% to 55%. Challenging images though grossly normal   Wall motion; there were grossly no significant regional wall   motion abnormalities. Left ventricular diastolic function   parameters were normal. - Ascending aorta: The ascending aorta was  moderately dilated. 4.4   cm - Left atrium: The appendage was mildly to moderately dilated. - Right ventricle: Systolic function was normal. - Pulmonary arteries: PA peak pressure: 44 mm Hg (S).   EKG:  EKG is ordered today.  The EKG ordered today demonstrates NSR, 70 bpm, nonspecific IVCD, nonspecific ST-T changes  Recent Labs: 05/22/2019: ALT 12; BUN 29; Creatinine, Ser 1.31; Hemoglobin 14.9; Platelets 160; Potassium 4.6; Sodium 143; TSH 4.720  Recent Lipid Panel    Component Value Date/Time   CHOL 155 05/22/2019 1149   CHOL 159 12/18/2015 0827   TRIG 81 05/22/2019 1149   TRIG 166 (H) 12/18/2015 0827   HDL 49 05/22/2019 1149   CHOLHDL 3.4 09/25/2018 1208   VLDL 31 09/25/2018 1208   VLDL 33 (H) 12/18/2015 0827   LDLCALC 90 05/22/2019 1149    PHYSICAL EXAM:    VS:  BP 122/76 (BP Location: Left Arm, Patient Position: Sitting, Cuff Size: Normal)    Pulse 70    Ht 6' (1.829 m)    Wt 214 lb 4 oz (97.2 kg)    SpO2 98%    BMI 29.06 kg/m   BMI: Body mass index is 29.06 kg/m.  Physical Exam  Constitutional: He is oriented to person, place, and time. He appears well-developed and well-nourished.  HENT:  Head: Normocephalic and atraumatic.  Eyes: Right eye exhibits no discharge. Left eye exhibits no  discharge.  Neck: No JVD present.  Cardiovascular: Normal rate, regular rhythm, S1 normal, S2 normal and normal heart sounds. Exam reveals no distant heart sounds, no friction rub, no midsystolic click and no opening snap.  No murmur heard. Pulses:      Posterior tibial pulses are 2+ on the right side and 2+ on the left side.  Pulmonary/Chest: Effort normal and breath sounds normal. No respiratory distress. He has no decreased breath sounds. He has no wheezes. He has no rales. He exhibits no tenderness.  Abdominal: Soft. He exhibits no distension. There is no abdominal tenderness.  Musculoskeletal:        General: No edema.     Cervical back: Normal range of motion.  Neurological: He is alert and oriented to person, place, and time.  Skin: Skin is warm and dry. No cyanosis. Nails show no clubbing.  Psychiatric: He has a normal mood and affect. His speech is normal and behavior is normal. Judgment and thought content normal.    Wt Readings from Last 3 Encounters:  12/02/19 214 lb 4 oz (97.2 kg)  09/12/19 216 lb (98 kg)  07/15/19 215 lb (97.5 kg)     ASSESSMENT & PLAN:   1. CAD status post CABG without angina: He is doing well without any symptoms concerning for angina.  He is on Eliquis in place of aspirin given PAF.  Continue secondary prevention with metoprolol and rosuvastatin.  No plans for ischemic evaluation at this time.  2. PAF: Noted during hospitalization in 05/2018 in the setting of the above acute illness.  No concerning symptoms for recurrence of A. fib.  Continue rate control with metoprolol.  Remains on Eliquis given CHADS2VASc of at least 3 without evidence of bleeding and stable hemoglobin.  3. AAA: Status post vertical repair.  Followed by vein and vascular.  Optimal BP, HR, and lipid control recommended.  4. HTN: Blood pressure is well controlled.  Continue Lopressor.  5. HLD: LDL of 90 from 05/2019 with goal LDL being less than 70.  Patient has been eating a diet  higher in fat.  He prefers lifestyle modification initially.  If his LDL remains above goal when checked in follow-up recommend addition of Zetia 10 mg daily.  6. Recent COVID-19 infection: He appears to have recovered nicely.  I have advised him with regards to returning back to prior activities this is ultimately a decision he needs to make with his wife and he should continue to appropriately social distance and wear a face covering.  Disposition: F/u with Dr. Fletcher Anon or an APP in 6 months, sooner if needed.   Medication Adjustments/Labs and Tests Ordered: Current medicines are reviewed at length with the patient today.  Concerns regarding medicines are outlined above. Medication changes, Labs and Tests ordered today are summarized above and listed in the Patient Instructions accessible in Encounters.   Signed, Christell Faith, PA-C 12/02/2019 4:33 PM     [address] [place] [address], [place] [address] [phone]

## 2019-12-02 ENCOUNTER — Ambulatory Visit (INDEPENDENT_AMBULATORY_CARE_PROVIDER_SITE_OTHER): Payer: Medicare HMO | Admitting: Physician Assistant

## 2019-12-02 ENCOUNTER — Encounter: Payer: Self-pay | Admitting: Physician Assistant

## 2019-12-02 ENCOUNTER — Other Ambulatory Visit: Payer: Self-pay

## 2019-12-02 VITALS — BP 122/76 | HR 70 | Ht 72.0 in | Wt 214.2 lb

## 2019-12-02 DIAGNOSIS — I10 Essential (primary) hypertension: Secondary | ICD-10-CM | POA: Diagnosis not present

## 2019-12-02 DIAGNOSIS — I714 Abdominal aortic aneurysm, without rupture, unspecified: Secondary | ICD-10-CM

## 2019-12-02 DIAGNOSIS — Z7189 Other specified counseling: Secondary | ICD-10-CM

## 2019-12-02 DIAGNOSIS — I2510 Atherosclerotic heart disease of native coronary artery without angina pectoris: Secondary | ICD-10-CM | POA: Diagnosis not present

## 2019-12-02 DIAGNOSIS — I480 Paroxysmal atrial fibrillation: Secondary | ICD-10-CM

## 2019-12-02 DIAGNOSIS — E785 Hyperlipidemia, unspecified: Secondary | ICD-10-CM

## 2019-12-02 NOTE — Patient Instructions (Signed)
Medication Instructions:  Your physician recommends that you continue on your current medications as directed. Please refer to the Current Medication list given to you today.  *If you need a refill on your cardiac medications before your next appointment, please call your pharmacy*  Lab Work: None ordered  If you have labs (blood work) drawn today and your tests are completely normal, you will receive your results only by: Marland Kitchen MyChart Message (if you have MyChart) OR . A paper copy in the mail If you have any lab test that is abnormal or we need to change your treatment, we will call you to review the results.  Testing/Procedures: None ordered   Follow-Up: At [facility], you and your health needs are our priority.  As part of our continuing mission to provide you with exceptional heart care, we have created designated Provider Care Teams.  These Care Teams include your primary Cardiologist (physician) and Advanced Practice Providers (APPs -  Physician Assistants and Nurse Practitioners) who all work together to provide you with the care you need, when you need it.  Your next appointment:   6 month(s)  The format for your next appointment:   In Person  Provider:    You may see [name], MD or Christell Faith, PA-C.

## 2019-12-27 ENCOUNTER — Ambulatory Visit: Payer: Medicare HMO | Admitting: Nurse Practitioner

## 2019-12-27 ENCOUNTER — Encounter: Payer: Self-pay | Admitting: Nurse Practitioner

## 2019-12-27 ENCOUNTER — Other Ambulatory Visit: Payer: Self-pay

## 2019-12-27 VITALS — BP 112/65 | HR 66 | Temp 98.2°F

## 2019-12-27 DIAGNOSIS — I1310 Hypertensive heart and chronic kidney disease without heart failure, with stage 1 through stage 4 chronic kidney disease, or unspecified chronic kidney disease: Secondary | ICD-10-CM

## 2019-12-27 DIAGNOSIS — I480 Paroxysmal atrial fibrillation: Secondary | ICD-10-CM | POA: Diagnosis not present

## 2019-12-27 DIAGNOSIS — E782 Mixed hyperlipidemia: Secondary | ICD-10-CM

## 2019-12-27 DIAGNOSIS — I714 Abdominal aortic aneurysm, without rupture, unspecified: Secondary | ICD-10-CM

## 2019-12-27 DIAGNOSIS — I252 Old myocardial infarction: Secondary | ICD-10-CM | POA: Insufficient documentation

## 2019-12-27 DIAGNOSIS — F324 Major depressive disorder, single episode, in partial remission: Secondary | ICD-10-CM | POA: Diagnosis not present

## 2019-12-27 DIAGNOSIS — D692 Other nonthrombocytopenic purpura: Secondary | ICD-10-CM | POA: Diagnosis not present

## 2019-12-27 DIAGNOSIS — E039 Hypothyroidism, unspecified: Secondary | ICD-10-CM

## 2019-12-27 DIAGNOSIS — N1831 Chronic kidney disease, stage 3a: Secondary | ICD-10-CM

## 2019-12-27 DIAGNOSIS — R7301 Impaired fasting glucose: Secondary | ICD-10-CM

## 2019-12-27 NOTE — Assessment & Plan Note (Signed)
Chronic, ongoing with rate controlled.  Continue current medication regimen and collaboration with cardiology.  CBC next visit, annually.

## 2019-12-27 NOTE — Progress Notes (Signed)
BP 112/65   Pulse 66   Temp 98.2 F (36.8 C) (Oral)   SpO2 97%    Subjective:    Patient ID: Stuart Harvey, male    DOB: 1950/11/24, 69 y.o.   MRN: [id number]  HPI: Stuart Harvey is a 69 y.o. male  Chief Complaint  Patient presents with  . Depression  . Hyperlipidemia  . Hypertension  . Hypothyroidism   HYPERTENSION / HYPERLIPIDEMIA/CKD 3 Continues on Crestor 40 MG and Metoprolol 25 MG.  Last saw cardiology 12/02/2019, no changes made, but did recommend Zetia if his LDL remains > 70.  Last saw vascular in August 2020 for his AAA, is 3.7 cm.  He was treated with MAB infusion at beginning of December for Covid and is doing well, with no further issues.  Is scheduled for Covid vaccine beginning of March, aware of need to wait 90 days after infusion. Satisfied with current treatment? yes Duration of hypertension: chronic BP monitoring frequency: not checking BP range:  BP medication side effects: no Duration of hyperlipidemia: chronic Cholesterol medication side effects: no Cholesterol supplements: none Medication compliance: good compliance Aspirin: no Recent stressors: no Recurrent headaches: no Visual changes: no Palpitations: no Dyspnea: no Chest pain: no Lower extremity edema: no Dizzy/lightheaded: no   HYPOTHYROIDISM Currently taking Levothyroxine 50 MCG daily. Thyroid control status:stable Satisfied with current treatment? yes Medication side effects: no Medication compliance: good compliance Etiology of hypothyroidism: unknown Recent dose adjustment:no Fatigue: no Cold intolerance: no Heat intolerance: no Weight gain: no Weight loss: no Constipation: no Diarrhea/loose stools: no Palpitations: no Lower extremity edema: no Anxiety/depressed mood: no   ATRIAL FIBRILLATION Followed by cardiology.  Taking Eliquis and Metoprolol. Atrial fibrillation status: stable Satisfied with current treatment: yes  Medication side effects:  no Medication  compliance: good compliance Etiology of atrial fibrillation:  Palpitations:  no Chest pain:  no Dyspnea on exertion:  no Orthopnea:  no Syncope:  no Edema:  no Ventricular rate control: B-blocker Anti-coagulation: long acting  IFG: Last A1C three years ago was 5.7%. Polydipsia/polyuria: no Visual disturbance: no Chest pain: no Paresthesias: no   HYPOTHYROIDISM Last TSH 4.720 and T4 7.1 in July 2020, continues on Levothyroxine 50 MCG. Thyroid control status:stable Satisfied with current treatment? yes Medication side effects: no Medication compliance: good compliance Etiology of hypothyroidism:  Recent dose adjustment:no Fatigue: no Cold intolerance: no Heat intolerance: no Weight gain: no Weight loss: no Constipation: no Diarrhea/loose stools: no Palpitations: no Lower extremity edema: no Anxiety/depressed mood: no  DEPRESSION Continues on Wellbutrin 200 MG BID and Lexapro 40 MG daily. Mood status: stable Satisfied with current treatment?: no Symptom severity: mild  Duration of current treatment : chronic Side effects: no Medication compliance: good compliance Psychotherapy/counseling: none Depressed mood: no Anxious mood: no Anhedonia: no Significant weight loss or gain: no Insomnia: occasionally hard to fall asleep Fatigue: no Feelings of worthlessness or guilt: no Impaired concentration/indecisiveness: no Suicidal ideations: no Hopelessness: no Crying spells: no Depression screen [facility] 2/9 12/27/2019 07/15/2019 05/22/2019 02/19/2019 08/20/2018  Decreased Interest 0 - 0 0 0  Down, Depressed, Hopeless 0 0 0 0 0  PHQ - 2 Score 0 0 0 0 0  Altered sleeping 0 - 0 1 0  Tired, decreased energy 0 - 0 0 1  Change in appetite 0 - 0 0 0  Feeling bad or failure about yourself  0 - 0 0 0  Trouble concentrating 0 - 0 0 0  Moving slowly or fidgety/restless 0 -  0 0 0  Suicidal thoughts 0 - 0 0 0  PHQ-9 Score 0 - 0 1 1  Difficult doing work/chores Not difficult at all -  Not difficult at all Not difficult at all -   Relevant past medical, surgical, family and social history reviewed and updated as indicated. Interim medical history since our last visit reviewed. Allergies and medications reviewed and updated.  Review of Systems  Constitutional: Negative for activity change, diaphoresis, fatigue and fever.  Respiratory: Negative for cough, chest tightness, shortness of breath and wheezing.   Cardiovascular: Negative for chest pain, palpitations and leg swelling.  Gastrointestinal: Negative.   Endocrine: Negative for cold intolerance, heat intolerance, polydipsia, polyphagia and polyuria.  Neurological: Negative.   Psychiatric/Behavioral: Negative.     Per HPI unless specifically indicated above     Objective:    BP 112/65   Pulse 66   Temp 98.2 F (36.8 C) (Oral)   SpO2 97%   Wt Readings from Last 3 Encounters:  12/02/19 214 lb 4 oz (97.2 kg)  09/12/19 216 lb (98 kg)  07/15/19 215 lb (97.5 kg)    Physical Exam Vitals and nursing note reviewed.  Constitutional:      General: He is awake. He is not in acute distress.    Appearance: He is well-developed. He is not ill-appearing.  HENT:     Head: Normocephalic and atraumatic.     Right Ear: Hearing normal. No drainage.     Left Ear: Hearing normal. No drainage.     Mouth/Throat:     Pharynx: Uvula midline.  Eyes:     General: Lids are normal.        Right eye: No discharge.        Left eye: No discharge.     Conjunctiva/sclera: Conjunctivae normal.     Pupils: Pupils are equal, round, and reactive to light.  Neck:     Thyroid: No thyromegaly.     Vascular: No carotid bruit or JVD.     Trachea: Trachea normal.  Cardiovascular:     Rate and Rhythm: Normal rate and regular rhythm.     Heart sounds: Normal heart sounds, S1 normal and S2 normal. No murmur. No gallop.   Pulmonary:     Effort: Pulmonary effort is normal.     Breath sounds: Normal breath sounds.  Abdominal:     General:  Bowel sounds are normal.     Palpations: Abdomen is soft. There is no hepatomegaly or splenomegaly.     Tenderness: There is no abdominal tenderness.  Musculoskeletal:        General: Normal range of motion.     Cervical back: Normal range of motion and neck supple.     Right lower leg: No edema.     Left lower leg: No edema.  Skin:    General: Skin is warm and dry.     Capillary Refill: Capillary refill takes less than 2 seconds.     Findings: No rash.     Comments: Scattered areas of pale purple bruises to bilateral upper extremities.  Intact skin.  Neurological:     Mental Status: He is alert and oriented to person, place, and time.     Deep Tendon Reflexes: Reflexes are normal and symmetric.  Psychiatric:        Mood and Affect: Mood normal.        Behavior: Behavior normal. Behavior is cooperative.        Thought Content: Thought content  normal.        Judgment: Judgment normal.     Results for orders placed or performed during the hospital encounter of 09/10/19  PSA  Result Value Ref Range   Prostatic Specific Antigen 1.20 0.00 - 4.00 ng/mL      Assessment & Plan:   Problem List Items Addressed This Visit      Cardiovascular and Mediastinum   Hypertensive heart/kidney disease without HF and with CKD stage III    Chronic, stable with BP at goal today.  Continue current medication regimen, adjust as needed, and collaboration with cardiology.  Recheck CMP today.  Recommend he check BP at home at least a few mornings a week.  Return in 6 months.      Relevant Orders   Comprehensive metabolic panel   AAA (abdominal aortic aneurysm) ([place]) - Primary    Followed by cardiology and vascular.  History of repair in 2008.  Continue to collaborate with team members.      Atrial fibrillation (HCC)    Chronic, ongoing with rate controlled.  Continue current medication regimen and collaboration with cardiology.  CBC next visit, annually.      Senile purpura (HCC)    Bilateral  upper extremity, takes Eliquis daily.   Continue to monitor for skin breakdown and encourage use of lotion daily to bilateral upper extremities.        Endocrine   IFG (impaired fasting glucose)    Recheck A1C today, continue diet focus.      Relevant Orders   HgB A1c   Hypothyroid    Chronic, ongoing.  Continue current medication regimen and adjust as needed.  Thyroid panel today.        Relevant Orders   Thyroid Panel With TSH     Genitourinary   CKD (chronic kidney disease) stage 3, GFR 30-59 ml/min    Chronic, ongoing with last GFR 56.  Recheck labs today.  Continue current medication regimen and adjust as needed.  Could consider addition of low dose ACE or ARB for kidney protection in future.  CMP today.        Other   Hyperlipidemia    Chronic, stable.  Continue current medication regimen.  Lipid panel and CMP today.  Will consider addition of Zetia as recommended by cardiology if LDL >70.  Discussed with patient.      Relevant Orders   Comprehensive metabolic panel   Lipid Panel w/o Chol/HDL Ratio   Depression    Chronic, stable with PHQ 9 = 0.  Continue current medication regimen and consider reduction in upcoming months of Wellbutrin.  Denies SI/HI.  Return in 6 months.          Follow up plan: Return in about 6 months (around 06/25/2020) for HTN/HLD, A-fib. Mood.

## 2019-12-27 NOTE — Assessment & Plan Note (Signed)
Chronic, stable with BP at goal today.  Continue current medication regimen, adjust as needed, and collaboration with cardiology.  Recheck CMP today.  Recommend he check BP at home at least a few mornings a week.  Return in 6 months.

## 2019-12-27 NOTE — Assessment & Plan Note (Signed)
Chronic, stable with PHQ 9 = 0.  Continue current medication regimen and consider reduction in upcoming months of Wellbutrin.  Denies SI/HI.  Return in 6 months.

## 2019-12-27 NOTE — Assessment & Plan Note (Signed)
Followed by cardiology and vascular.  History of repair in 2008.  Continue to collaborate with team members.

## 2019-12-27 NOTE — Assessment & Plan Note (Signed)
Recheck A1C today, continue diet focus.

## 2019-12-27 NOTE — Assessment & Plan Note (Signed)
Bilateral upper extremity, takes Eliquis daily.   Continue to monitor for skin breakdown and encourage use of lotion daily to bilateral upper extremities. 

## 2019-12-27 NOTE — Assessment & Plan Note (Addendum)
Chronic, ongoing with last GFR 56.  Recheck labs today.  Continue current medication regimen and adjust as needed.  Could consider addition of low dose ACE or ARB for kidney protection in future.  CMP today.

## 2019-12-27 NOTE — Patient Instructions (Signed)
Hypothyroidism  Hypothyroidism is when the thyroid gland does not make enough of certain hormones (it is underactive). The thyroid gland is a small gland located in the lower front part of the neck, just in front of the windpipe (trachea). This gland makes hormones that help control how the body uses food for energy (metabolism) as well as how the heart and brain function. These hormones also play a role in keeping your bones strong. When the thyroid is underactive, it produces too little of the hormones thyroxine (T4) and triiodothyronine (T3). What are the causes? This condition may be caused by:  Hashimoto's disease. This is a disease in which the body's disease-fighting system (immune system) attacks the thyroid gland. This is the most common cause.  Viral infections.  Pregnancy.  Certain medicines.  Birth defects.  Past radiation treatments to the head or neck for cancer.  Past treatment with radioactive iodine.  Past exposure to radiation in the environment.  Past surgical removal of part or all of the thyroid.  Problems with a gland in the center of the brain (pituitary gland).  Lack of enough iodine in the diet. What increases the risk? You are more likely to develop this condition if:  You are male.  You have a family history of thyroid conditions.  You use a medicine called lithium.  You take medicines that affect the immune system (immunosuppressants). What are the signs or symptoms? Symptoms of this condition include:  Feeling as though you have no energy (lethargy).  Not being able to tolerate cold.  Weight gain that is not explained by a change in diet or exercise habits.  Lack of appetite.  Dry skin.  Coarse hair.  Menstrual irregularity.  Slowing of thought processes.  Constipation.  Sadness or depression. How is this diagnosed? This condition may be diagnosed based on:  Your symptoms, your medical history, and a physical exam.  Blood  tests. You may also have imaging tests, such as an ultrasound or MRI. How is this treated? This condition is treated with medicine that replaces the thyroid hormones that your body does not make. After you begin treatment, it may take several weeks for symptoms to go away. Follow these instructions at home:  Take over-the-counter and prescription medicines only as told by your health care provider.  If you start taking any new medicines, tell your health care provider.  Keep all follow-up visits as told by your health care provider. This is important. ? As your condition improves, your dosage of thyroid hormone medicine may change. ? You will need to have blood tests regularly so that your health care provider can monitor your condition. Contact a health care provider if:  Your symptoms do not get better with treatment.  You are taking thyroid replacement medicine and you: ? Sweat a lot. ? Have tremors. ? Feel anxious. ? Lose weight rapidly. ? Cannot tolerate heat. ? Have emotional swings. ? Have diarrhea. ? Feel weak. Get help right away if you have:  Chest pain.  An irregular heartbeat.  A rapid heartbeat.  Difficulty breathing. Summary  Hypothyroidism is when the thyroid gland does not make enough of certain hormones (it is underactive).  When the thyroid is underactive, it produces too little of the hormones thyroxine (T4) and triiodothyronine (T3).  The most common cause is Hashimoto's disease, a disease in which the body's disease-fighting system (immune system) attacks the thyroid gland. The condition can also be caused by viral infections, medicine, pregnancy, or past   radiation treatment to the head or neck.  Symptoms may include weight gain, dry skin, constipation, feeling as though you do not have energy, and not being able to tolerate cold.  This condition is treated with medicine to replace the thyroid hormones that your body does not make. This information  is not intended to replace advice given to you by your health care provider. Make sure you discuss any questions you have with your health care provider. Document Revised: 10/13/2017 Document Reviewed: 10/11/2017 Elsevier Patient Education  2020 [employer] Inc.  

## 2019-12-27 NOTE — Assessment & Plan Note (Signed)
Chronic, ongoing.  Continue current medication regimen and adjust as needed.  Thyroid panel today. 

## 2019-12-27 NOTE — Assessment & Plan Note (Signed)
Chronic, stable.  Continue current medication regimen.  Lipid panel and CMP today.  Will consider addition of Zetia as recommended by cardiology if LDL >70.  Discussed with patient.

## 2019-12-28 ENCOUNTER — Other Ambulatory Visit: Payer: Self-pay | Admitting: Unknown Physician Specialty

## 2019-12-28 LAB — COMPREHENSIVE METABOLIC PANEL
ALT: 13 IU/L (ref 0–44)
AST: 28 IU/L (ref 0–40)
Albumin/Globulin Ratio: 1.6 (ref 1.2–2.2)
Albumin: 3.9 g/dL (ref 3.8–4.8)
Alkaline Phosphatase: 103 IU/L (ref 39–117)
BUN/Creatinine Ratio: 16 (ref 10–24)
BUN: 20 mg/dL (ref 8–27)
Bilirubin Total: 0.3 mg/dL (ref 0.0–1.2)
CO2: 20 mmol/L (ref 20–29)
Calcium: 9.2 mg/dL (ref 8.6–10.2)
Chloride: 109 mmol/L — ABNORMAL HIGH (ref 96–106)
Creatinine, Ser: 1.28 mg/dL — ABNORMAL HIGH (ref 0.76–1.27)
GFR calc Af Amer: 66 mL/min/{1.73_m2} (ref >59)
GFR calc non Af Amer: 57 mL/min/{1.73_m2} — ABNORMAL LOW (ref >59)
Globulin, Total: 2.5 g/dL (ref 1.5–4.5)
Glucose: 100 mg/dL — ABNORMAL HIGH (ref 65–99)
Potassium: 4.6 mmol/L (ref 3.5–5.2)
Sodium: 142 mmol/L (ref 134–144)
Total Protein: 6.4 g/dL (ref 6.0–8.5)

## 2019-12-28 LAB — THYROID PANEL WITH TSH
Free Thyroxine Index: 1.7 (ref 1.2–4.9)
T3 Uptake Ratio: 26 % (ref 24–39)
T4, Total: 6.7 ug/dL (ref 4.5–12.0)
TSH: 3.14 u[IU]/mL (ref 0.450–4.500)

## 2019-12-28 LAB — LIPID PANEL W/O CHOL/HDL RATIO
Cholesterol, Total: 134 mg/dL (ref 100–199)
HDL: 52 mg/dL (ref >39)
LDL Chol Calc (NIH): 62 mg/dL (ref 0–99)
Triglycerides: 107 mg/dL (ref 0–149)
VLDL Cholesterol Cal: 20 mg/dL (ref 5–40)

## 2019-12-28 LAB — HEMOGLOBIN A1C
Est. average glucose Bld gHb Est-mCnc: 120 mg/dL
Hgb A1c MFr Bld: 5.8 % — ABNORMAL HIGH (ref 4.8–5.6)

## 2019-12-28 NOTE — Telephone Encounter (Signed)
Requested Prescriptions  Pending Prescriptions Disp Refills  . metoprolol tartrate (LOPRESSOR) 25 MG tablet [Pharmacy Med Name: METOPROLOL TARTRATE 25 MG TAB] 90 tablet 1    Sig: Take 0.5 tablets (12.5 mg total) by mouth 2 (two) times daily.     Cardiovascular:  Beta Blockers Passed - 12/28/2019 11:28 AM      Passed - Last BP in normal range    BP Readings from Last 1 Encounters:  12/27/19 112/65         Passed - Last Heart Rate in normal range    Pulse Readings from Last 1 Encounters:  12/27/19 66         Passed - Valid encounter within last 6 months    Recent Outpatient Visits          Yesterday Abdominal aortic aneurysm (AAA) without rupture ([place])   [place] Cannady, Henrine Screws T, NP   1 month ago Lab test positive for detection of COVID-19 virus   [place], [place] T, NP   2 months ago Lab test positive for detection of COVID-19 virus   [facility] [place], [place] T, NP   7 months ago Hypertensive heart/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 citalopram (CELEXA) 40 MG tablet [Pharmacy Med Name: CITALOPRAM HBR 40 MG TABLET] 90 tablet 1    Sig: Take 1 tablet (40 mg total) by mouth daily.     Psychiatry:  Antidepressants - SSRI Passed - 12/28/2019 11:28 AM      Passed - Completed PHQ-2 or PHQ-9 in the last 360 days.      Passed - Valid encounter within last 6 months    Recent Outpatient Visits          Yesterday Abdominal aortic aneurysm (AAA) without rupture ([place])   [place] Cannady,  Henrine Screws T, NP   1 month ago Lab test positive for detection of COVID-19 virus   [place], [place] T, NP   2 months ago Lab test positive for detection of COVID-19 virus   [facility] [employer], [place] T, NP   7 months ago Hypertensive heart/kidney disease without HF and with CKD stage III ([place])   [place] Cannady, [name], NP   10 months ago Paroxysmal atrial fibrillation ([place])   [place], Barbaraann Faster, NP      Future Appointments            In 5 months Ar[place], Mertie Clause, MD [place], [facility]   In 6 months [place], Barbaraann Faster, NP [facility] [facility], [facility]   In 8 months [place], Herbert Seta, [place]

## 2019-12-28 NOTE — Progress Notes (Signed)
Please let Stuart Harvey know via phone or letter: - Thyroid test in normal range, continue current Levothyroxine dose - Kidney function continues to show kidney disease stage 3, this remains stable with no worsening.  We will continue to monitor.  Liver test is normal. - Cholesterol levels are at goal range, continue Rosuvastatin. - A1C, diabetes testing, continues to show prediabetes.  Focus on diet with lower carbohydrate and sugar intake. Have a great day!!

## 2019-12-30 ENCOUNTER — Other Ambulatory Visit: Payer: Self-pay

## 2019-12-30 MED ORDER — LEVOTHYROXINE SODIUM 50 MCG PO TABS: 50.0000 ug | ORAL_TABLET | Freq: Every day | ORAL | 3 refills | Status: DC

## 2019-12-30 NOTE — Telephone Encounter (Signed)
Refill request for Levothyroxine LOV: 12/27/19 Next Appt: 06/26/20

## 2020-01-24 ENCOUNTER — Other Ambulatory Visit: Payer: Self-pay | Admitting: Nurse Practitioner

## 2020-01-29 ENCOUNTER — Other Ambulatory Visit: Payer: Self-pay | Admitting: Nurse Practitioner

## 2020-01-29 MED ORDER — OMEPRAZOLE 20 MG PO CPDR: 20.0000 mg | DELAYED_RELEASE_CAPSULE | Freq: Every day | ORAL | 1 refills | Status: DC

## 2020-01-29 NOTE — Telephone Encounter (Signed)
Medication Refill - Medication: omeprazole (PRILOSEC) 20 MG capsule    Preferred Pharmacy (with phone number or street name):  [place], [place] Phone:  [phone]  Fax:  [phone]       Agent: Please be advised that RX refills may take up to 3 business days. We ask that you follow-up with your pharmacy.

## 2020-03-13 ENCOUNTER — Ambulatory Visit
Admission: EM | Admit: 2020-03-13 | Discharge: 2020-03-13 | Disposition: A | Discharge status: Home / Self Care | Payer: Medicare HMO | Attending: Family Medicine | Admitting: Family Medicine

## 2020-03-13 ENCOUNTER — Encounter: Payer: Self-pay | Admitting: Emergency Medicine

## 2020-03-13 ENCOUNTER — Other Ambulatory Visit: Payer: Self-pay

## 2020-03-13 DIAGNOSIS — S61012A Laceration without foreign body of left thumb without damage to nail, initial encounter: Secondary | ICD-10-CM | POA: Diagnosis not present

## 2020-03-13 DIAGNOSIS — M79645 Pain in left finger(s): Secondary | ICD-10-CM | POA: Diagnosis not present

## 2020-03-13 MED ORDER — CEPHALEXIN 500 MG PO CAPS: 500.0000 mg | ORAL_CAPSULE | Freq: Two times a day (BID) | ORAL | 0 refills | Status: AC

## 2020-03-13 NOTE — ED Triage Notes (Signed)
Pt has laceration on his left thumb. He states he cut it on the lawn mower blade last night. The blade was turned off but the mower was still running [place] tetanus   vaccine was 06/16/2015.

## 2020-03-13 NOTE — ED Provider Notes (Signed)
Stuart Harvey    CSN: [id number] Arrival date & time: 03/13/20  1049      History   Chief Complaint Chief Complaint  Patient presents with  . Laceration    left thumb    HPI OVED PALMBERG is a 69 y.o. male.   Reports that he was working on his lawnmower yesterday, and that he cut his left thumb with the blade.  Last tetanus was in August 2016.  Reports that wife wrapped it up yesterday.  He denies seeing any foreign bodies in the wound.  Denies excessive blood loss.  He does report that he is taking Xarelto.  Denies loss of consciousness, headache, nausea, vomiting, diarrhea, rash, fever, other symptoms.  ROS per HPI  The history is provided by the patient.  Laceration   Past Medical History:  Diagnosis Date  . AAA (abdominal aortic aneurysm) ([place])    s/p surgical repair in 08/2007 by Dr. Hulda Humphrey  . Anemia   . Arthritis    both knees  . CKD (chronic kidney disease), stage II   . Coronary artery disease    a. 4-vCABG @ [place] 06/2007 (LIMA-LAD, VG-RPDA, VG-OM3,VG-D2; b. LHC 2011: patent grafts EF 40%, c. LHC 06/05/18: LM nl, pLAD 50%, p-mLAD 50%, ostD1 70%, ost ramus 30%, ost-pLCx 40%, pLCx 100% (chronically occluded), OM3 100%, p-dRCA 100% (chronically occluded), LIMA-LAD patent, VG-RPDA patent, VG-OM3 mid-graft 60% and 80%  . Depression   . GERD (gastroesophageal reflux disease)   . Hernia of abdominal cavity   . Hyperlipidemia   . Hypertension   . Hypothyroidism     Patient Active Problem List   Diagnosis Date Noted  . History of MI (myocardial infarction) 12/27/2019  . Senile purpura ([place]) 08/20/2018  . Atrial fibrillation ([place]) 06/14/2018  . Iron deficiency anemia 08/10/2017  . Elevated PSA 07/10/2017  . Varicose veins of both lower extremities with inflammation 06/13/2017  . Counseling regarding advanced directives and goals of care 01/02/2017  . CKD (chronic kidney disease) stage 3, GFR 30-59 ml/min 12/18/2015  . IFG (impaired fasting glucose)  12/18/2015  . Hypothyroid 12/18/2015  . Renal mass 08/17/2015  . H/O total knee replacement 08/28/2014  . Arthritis of knee, degenerative 05/05/2014  . Depression 08/28/2012  . Coronary artery disease   . 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 Hyperlipidemia   . AAA (abdominal aortic aneurysm) [facility])     Past Surgical History:  Procedure Laterality Date  . ABDOMINAL AORTIC ANEURYSM REPAIR  08/2007  . ABDOMINAL AORTIC ANEURYSM REPAIR  11/2012   [facility]  . CARDIAC CATHETERIZATION  2008, 2011   [facility]   . CORONARY ARTERY BYPASS GRAFT  2008  . CORONARY/GRAFT ANGIOGRAPHY N/A 06/05/2018   Procedure: CORONARY/GRAFT ANGIOGRAPHY;  Surgeon: Nelva Bush, MD;  Location: [place] CV [facility];  Service: Cardiovascular;  Laterality: N/A;  . EXPLORATORY LAPAROTOMY  1997  . HERNIA REPAIR    . JOINT REPLACEMENT     right knee  . KNEE ARTHROPLASTY Left 09/19/2016   Procedure: COMPUTER ASSISTED TOTAL KNEE ARTHROPLASTY;  Surgeon: Dereck Leep, MD;  Location: [facility] ORS;  Service: Orthopedics;  Laterality: Left;  . TOTAL KNEE ARTHROPLASTY Right 09/2016       Home Medications    Prior to Admission medications   Medication Sig Start Date End Date Taking? Authorizing Provider  buPROPion (WELLBUTRIN SR) 200 MG 12 hr tablet Take 1 tablet (200 mg total) by mouth 2 (two) times daily. 01/24/20  Yes Cannady, [name], NP  Cholecalciferol (D3 SUPER STRENGTH) 2000 UNITS CAPS Take 1 capsule by mouth daily.    Yes [provider]  citalopram (CELEXA) 40 MG tablet Take 1 tablet (40 mg total) by mouth daily. 12/28/19  Yes Cannady, [name], NP  ELIQUIS 5 MG TABS tablet Take 1 tablet (5 mg total) by mouth 2 (two) times daily. 11/18/19  Yes Wellington Hampshire, MD  levothyroxine (SYNTHROID) 50 MCG tablet Take 1 tablet (50 mcg total) by mouth daily. 12/30/19  Yes Cannady, [name], NP  metoprolol tartrate (LOPRESSOR) 25 MG tablet Take 0.5 tablets (12.5 mg total) by mouth 2 (two)  times daily. 12/28/19  Yes Cannady, [name], NP  omeprazole (PRILOSEC) 20 MG capsule Take 1 capsule (20 mg total) by mouth daily. 01/29/20  Yes Cannady, [name], NP  rosuvastatin (CRESTOR) 40 MG tablet Take 1 tablet (40 mg total) by mouth daily. 08/19/19  Yes Dunn, Areta Haber, PA-C  tadalafil (CIALIS) 5 MG tablet Take 1 tablet (5 mg total) by mouth daily as needed for erectile dysfunction. 09/11/18  Yes Billey Co, MD  tamsulosin (FLOMAX) 0.4 MG CAPS capsule Take 1 capsule (0.4 mg total) by mouth daily. 09/12/19  Yes Billey Co, MD  cephALEXin (KEFLEX) 500 MG capsule Take 1 capsule (500 mg total) by mouth 2 (two) times daily for 7 days. 03/13/20 03/20/20  Faustino Congress, NP    Family History Family History  Problem Relation Age of Onset  . Heart disease Mother   . Heart attack Mother   . Hypertension Mother   . Heart attack Father   . Hypertension Father   . Kidney disease Brother   . Depression Maternal Grandmother   . Depression Maternal Grandfather   . Heart disease Other   . Heart attack Other   . Prostate cancer Neg Hx   . Kidney cancer Neg Hx   . Stomach cancer Neg Hx   . Colon cancer Neg Hx     Social History Social History   Tobacco Use  . Smoking status: Former Smoker    Packs/day: 1.00    Years: 35.00    Pack years: 35.00    Types: Cigarettes    Quit date: 12/16/2005    Years since quitting: 14.2  . Smokeless tobacco: Current User    Types: Chew  . Tobacco comment: pt states he very rarely uses smokeless tobacco   Substance Use Topics  . Alcohol use: No  . Drug use: No     Allergies   Benzodiazepines, Codeine, and Tetracycline   Review of Systems Review of Systems   Physical Exam Triage Vital Signs ED Triage Vitals  Enc Vitals Group     BP 03/13/20 1054 119/73     Pulse Rate 03/13/20 1054 69     Resp 03/13/20 1054 18     Temp 03/13/20 1054 98.2 F (36.8 C)     Temp Source 03/13/20 1054 Oral     SpO2 03/13/20 1054 96 %     Weight  03/13/20 1054 214 lb 4.6 oz (97.2 kg)     Height 03/13/20 1054 6' (1.829 m)     Head Circumference --      Peak Flow --      Pain Score 03/13/20 1052 4     Pain Loc --      Pain Edu? --      Excl. in [place]? --    No data found.  Updated Vital  Signs BP 119/73 (BP Location: Left Arm)   Pulse 69   Temp 98.2 F (36.8 C) (Oral)   Resp 18   Ht 6' (1.829 m)   Wt 214 lb 4.6 oz (97.2 kg)   SpO2 96%   BMI 29.06 kg/m   Visual Acuity Right Eye Distance:   Left Eye Distance:   Bilateral Distance:    Right Eye Near:   Left Eye Near:    Bilateral Near:     Physical Exam Vitals and nursing note reviewed.  Constitutional:      General: He is not in acute distress.    Appearance: Normal appearance. He is well-developed and normal weight. He is not ill-appearing.  HENT:     Head: Normocephalic and atraumatic.  Eyes:     Conjunctiva/sclera: Conjunctivae normal.  Cardiovascular:     Rate and Rhythm: Normal rate and regular rhythm.     Pulses: Normal pulses.     Heart sounds: Normal heart sounds. No murmur.  Pulmonary:     Effort: Pulmonary effort is normal. No respiratory distress.     Breath sounds: Normal breath sounds. No stridor. No wheezing, rhonchi or rales.  Chest:     Chest wall: No tenderness.  Abdominal:     General: Bowel sounds are normal.     Palpations: Abdomen is soft.     Tenderness: There is no abdominal tenderness.  Musculoskeletal:        General: Normal range of motion.     Cervical back: Normal range of motion and neck supple.  Skin:    General: Skin is warm and dry.     Capillary Refill: Capillary refill takes less than 2 seconds.     Findings: Laceration present.     Comments: Approximately 2-1/2 cm laceration to DIP joint of left thumb.  Bleeding is controlled.  Wound has begun to approximate.  Neurological:     General: No focal deficit present.     Mental Status: He is alert and oriented to person, place, and time.  Psychiatric:        Mood and  Affect: Mood normal.        Behavior: Behavior normal.      UC Treatments / Results  Labs (all labs ordered are listed, but only abnormal results are displayed) Labs Reviewed - No data to display  EKG   Radiology No results found.  Procedures Laceration Repair  Date/Time: 03/13/2020 2:03 PM Performed by: Faustino Congress, NP Authorized by: Faustino Congress, NP   Consent:    Consent obtained:  Verbal   Consent given by:  Patient   Risks discussed:  Infection and poor wound healing Anesthesia (see MAR for exact dosages):    Anesthesia method:  None Laceration details:    Location:  Finger   Finger location:  L thumb   Length (cm):  2.5   Depth (mm):  2 Treatment:    Amount of cleaning:  Standard   Irrigation solution:  Sterile saline   Irrigation volume:  10   Irrigation method:  Syringe Skin repair:    Repair method:  Tissue adhesive Approximation:    Approximation:  Close Post-procedure details:    Dressing:  Bulky dressing   Patient tolerance of procedure:  Tolerated well, no immediate complications   (including critical care time)  Medications Ordered in UC Medications - No data to display  Initial Impression / Assessment and Plan / UC Course  I have reviewed the triage vital signs and  the nursing notes.  Pertinent labs & imaging results that were available during my care of the patient were reviewed by me and considered in my medical decision making (see chart for details).     Pain of Left Thumb Laceration to L thumb without foreign body: Presents with 2 and half centimeter laceration to left thumb. Mildly tender to palpation, is swollen all the way up to the to the pad of the thumb.  Wound irrigated with normal saline, about 1 mm in size black foreign body removed from the area.  Area cleansed with Shur-Clens.  Area approximated with skin glue.  Area wrapped with bulky dressing to prevent bending and to protect the area from pain and possibly  further trauma. Final Clinical Impressions(s) / UC Diagnoses   Final diagnoses:  Laceration of left thumb without foreign body without damage to nail, initial encounter  Pain of left thumb     Discharge Instructions     I have put glue over your laceration on your left thumb. This will fall off on its own as your wound begins to heal.  I have sent in Keflex for you to take twice daily for the next 7 days.  Your tetanus vaccine was up-to-date, so we did not do any of those for you today.  You may get the area wet while the skin glue is in place, it has dried fully by now Follow-up with [place] or with your primary care provider as needed.     ED Prescriptions    Medication Sig Dispense Auth. Provider   cephALEXin (KEFLEX) 500 MG capsule Take 1 capsule (500 mg total) by mouth 2 (two) times daily for 7 days. 14 capsule Faustino Congress, NP     PDMP not reviewed this encounter.   Faustino Congress, NP 03/13/20 [phone]

## 2020-03-13 NOTE — Discharge Instructions (Signed)
I have put glue over your laceration on your left thumb. This will fall off on its own as your wound begins to heal.  I have sent in Keflex for you to take twice daily for the next 7 days.  Your tetanus vaccine was up-to-date, so we did not do any of those for you today.  You may get the area wet while the skin glue is in place, it has dried fully by now Follow-up with [place] or with your primary care provider as needed.

## 2020-04-01 ENCOUNTER — Other Ambulatory Visit: Payer: Self-pay | Admitting: Unknown Physician Specialty

## 2020-04-01 NOTE — Telephone Encounter (Signed)
Requested Prescriptions  Pending Prescriptions Disp Refills  . citalopram (CELEXA) 40 MG tablet [Pharmacy Med Name: CITALOPRAM HBR 40 MG TABLET] 90 tablet 0    Sig: Take 1 tablet (40 mg total) by mouth daily.     Psychiatry:  Antidepressants - SSRI Passed - 04/01/2020 10:07 AM      Passed - Completed PHQ-2 or PHQ-9 in the last 360 days.      Passed - Valid encounter within last 6 months    Recent Outpatient Visits          3 months ago Abdominal aortic aneurysm (AAA) without rupture ([place])   [place] Cannady, Henrine Screws T, NP   4 months ago Lab test positive for detection of COVID-19 virus   [place], [place] T, NP   5 months ago Lab test positive for detection of COVID-19 virus   [employer], [place] T, NP   10 months ago Hypertensive heart/kidney disease without HF and with CKD stage III ([place])   [place] Cannady, [name], NP   1 year ago Paroxysmal atrial fibrillation ([place])   [place], Barbaraann Faster, NP      Future Appointments            In 2 months Ar[place], Mertie Clause, MD [place], [facility]   In 2 months [place], Barbaraann Faster, NP [employer], [place]   In 5 months [place], Herbert Seta, [facility]

## 2020-04-14 ENCOUNTER — Other Ambulatory Visit: Payer: Self-pay | Admitting: Unknown Physician Specialty

## 2020-05-04 ENCOUNTER — Other Ambulatory Visit: Payer: Self-pay | Admitting: Cardiovascular Disease

## 2020-05-04 ENCOUNTER — Telehealth: Payer: Self-pay

## 2020-05-04 NOTE — Telephone Encounter (Signed)
Refill was sent in electronically earlier today.

## 2020-05-04 NOTE — Telephone Encounter (Signed)
Incoming fax from [employer]. Eliquis 5MG  - 90 day supply.

## 2020-05-04 NOTE — Telephone Encounter (Signed)
Please review for refill, Thanks !  

## 2020-05-04 NOTE — Telephone Encounter (Signed)
Pt's age 69, wt 97.2 kg, SCr 1.28, CrCl 75.94, last ov w/ RD 12/02/19.

## 2020-06-09 ENCOUNTER — Other Ambulatory Visit: Payer: Self-pay

## 2020-06-09 ENCOUNTER — Encounter: Payer: Self-pay | Admitting: Cardiovascular Disease

## 2020-06-09 ENCOUNTER — Ambulatory Visit: Payer: Medicare HMO | Admitting: Cardiovascular Disease

## 2020-06-09 ENCOUNTER — Other Ambulatory Visit
Admission: RE | Admit: 2020-06-09 | Discharge: 2020-06-09 | Disposition: A | Discharge status: Home / Self Care | Payer: Medicare HMO | Attending: Cardiovascular Disease | Admitting: Cardiovascular Disease

## 2020-06-09 VITALS — BP 90/60 | HR 77 | Ht 72.0 in | Wt 210.0 lb

## 2020-06-09 DIAGNOSIS — I480 Paroxysmal atrial fibrillation: Secondary | ICD-10-CM | POA: Diagnosis present

## 2020-06-09 DIAGNOSIS — I714 Abdominal aortic aneurysm, without rupture, unspecified: Secondary | ICD-10-CM

## 2020-06-09 DIAGNOSIS — I2510 Atherosclerotic heart disease of native coronary artery without angina pectoris: Secondary | ICD-10-CM | POA: Diagnosis present

## 2020-06-09 DIAGNOSIS — I10 Essential (primary) hypertension: Secondary | ICD-10-CM

## 2020-06-09 DIAGNOSIS — E785 Hyperlipidemia, unspecified: Secondary | ICD-10-CM

## 2020-06-09 LAB — CBC WITH DIFFERENTIAL/PLATELET
Abs Immature Granulocytes: 0.02 10*3/uL (ref 0.00–0.07)
Basophils Absolute: 0.1 10*3/uL (ref 0.0–0.1)
Basophils Relative: 1 %
Eosinophils Absolute: 0.4 10*3/uL (ref 0.0–0.5)
Eosinophils Relative: 5 %
HCT: 42.9 % (ref 39.0–52.0)
Hemoglobin: 14.6 g/dL (ref 13.0–17.0)
Immature Granulocytes: 0 %
Lymphocytes Relative: 43 %
Lymphs Abs: 3.6 10*3/uL (ref 0.7–4.0)
MCH: 30.2 pg (ref 26.0–34.0)
MCHC: 34 g/dL (ref 30.0–36.0)
MCV: 88.8 fL (ref 80.0–100.0)
Monocytes Absolute: 0.5 10*3/uL (ref 0.1–1.0)
Monocytes Relative: 7 %
Neutro Abs: 3.8 10*3/uL (ref 1.7–7.7)
Neutrophils Relative %: 44 %
Platelets: 156 10*3/uL (ref 150–400)
RBC: 4.83 MIL/uL (ref 4.22–5.81)
RDW: 14.4 % (ref 11.5–15.5)
WBC: 8.3 10*3/uL (ref 4.0–10.5)
nRBC: 0 % (ref 0.0–0.2)

## 2020-06-09 LAB — BASIC METABOLIC PANEL
Anion gap: 13 (ref 5–15)
BUN: 35 mg/dL — ABNORMAL HIGH (ref 8–23)
CO2: 19 mmol/L — ABNORMAL LOW (ref 22–32)
Calcium: 9.4 mg/dL (ref 8.9–10.3)
Chloride: 109 mmol/L (ref 98–111)
Creatinine, Ser: 1.49 mg/dL — ABNORMAL HIGH (ref 0.61–1.24)
GFR calc Af Amer: 55 mL/min — ABNORMAL LOW (ref >60)
GFR calc non Af Amer: 48 mL/min — ABNORMAL LOW (ref >60)
Glucose, Bld: 100 mg/dL — ABNORMAL HIGH (ref 70–99)
Potassium: 4.3 mmol/L (ref 3.5–5.1)
Sodium: 141 mmol/L (ref 135–145)

## 2020-06-09 NOTE — Patient Instructions (Signed)
Medication Instructions:  Your physician has recommended you make the following change in your medication:   STOP Metoprolol  *If you need a refill on your cardiac medications before your next appointment, please call your pharmacy*   Lab Work: Bmet and Cbc today.  Please have your labs drawn at the medical mall  If you have labs (blood work) drawn today and your tests are completely normal, you will receive your results only by: Marland Kitchen MyChart Message (if you have MyChart) OR . A paper copy in the mail If you have any lab test that is abnormal or we need to change your treatment, we will call you to review the results.   Testing/Procedures: None ordered   Follow-Up: At [facility], you and your health needs are our priority.  As part of our continuing mission to provide you with exceptional heart care, we have created designated Provider Care Teams.  These Care Teams include your primary Cardiologist (physician) and Advanced Practice Providers (APPs -  Physician Assistants and Nurse Practitioners) who all work together to provide you with the care you need, when you need it.  We recommend signing up for the patient portal called "MyChart".  Sign up information is provided on this After Visit Summary.  MyChart is used to connect with patients for Virtual Visits (Telemedicine).  Patients are able to view lab/test results, encounter notes, upcoming appointments, etc.  Non-urgent messages can be sent to your provider as well.   To learn more about what you can do with MyChart, go to [email or web address].    Your next appointment:   6 month(s)  The format for your next appointment:   In Person  Provider:    You may see [name], MD or one of the following Advanced Practice Providers on your designated Care Team:    Murray Hodgkins, NP  Christell Faith, PA-C  Marrianne Mood, PA-C    Other Instructions N/A

## 2020-06-09 NOTE — Progress Notes (Signed)
Cardiology Office Note   Date:  06/09/2020   ID:  Stuart Harvey, DOB 03-20-1951, MRN [id number]  PCP:  Guadalupe Maple, MD  Cardiologist:   [name], MD   Chief Complaint  Patient presents with  . office visit    6 month F/U-Patient states he is in the donut hole and is having difficulty affording Eliquis; Meds verbally reviewed with patient.      History of Present Illness: Stuart Harvey is a 69 y.o. male who presents fora followup visit regarding coronary artery disease.  He has known history of severe three-vessel coronary artery disease diagnosed in 2008 as well as a large infrarenal abdominal aortic aneurysm. He underwent coronary artery bypass graft surgery in August 2008 at [facility]. He underwent open surgical repair of aortic aneurysm in October of 2008.    Aortic aneurysm followup is done  by Dr. Lucky Cowboy.  He was hospitalized in July of 2019 with unresponsiveness.  He reportedly was down for about 1 hour with possible seizure-like activities.  He had numerous contusions.  Per EMS he was in ventricular tachycardia upon arrival and was cardioverted.  He was briefly intubated and was found to have mildly elevated troponin.  CPK was close to 4000.  Echo showed an EF of 50 to 55%.  He underwent cardiac catheterization which showed severe underlying three-vessel coronary artery disease with patent grafts including LIMA to LAD, SVG to RCA, SVG to diagonal and SVG to OM 3.  There was borderline disease in the SVG to OM but that was left to be treated medically given that it was supplying a small area.  The patient did develop atrial fibrillation while hospitalized.  He converted spontaneously.  His presentation was felt to be possibly triggered by heat stroke.  He had COVID-19 infection in November 2020 and was treated with monoclonal antibody infusion.  He has been doing reasonably well overall with no chest pain or shortness of breath.  He reports that his blood pressure has been  running low with mild dizziness.  Anticoagulation with Eliquis is expensive right now as he is in the donut hole.  Past Medical History:  Diagnosis Date  . AAA (abdominal aortic aneurysm) ([place])    s/p surgical repair in 08/2007 by Dr. Hulda Humphrey  . Anemia   . Arthritis    both knees  . CKD (chronic kidney disease), stage II   . Coronary artery disease    a. 4-vCABG @ [place] 06/2007 (LIMA-LAD, VG-RPDA, VG-OM3,VG-D2; b. LHC 2011: patent grafts EF 40%, c. LHC 06/05/18: LM nl, pLAD 50%, p-mLAD 50%, ostD1 70%, ost ramus 30%, ost-pLCx 40%, pLCx 100% (chronically occluded), OM3 100%, p-dRCA 100% (chronically occluded), LIMA-LAD patent, VG-RPDA patent, VG-OM3 mid-graft 60% and 80%  . Depression   . GERD (gastroesophageal reflux disease)   . Hernia of abdominal cavity   . Hyperlipidemia   . Hypertension   . Hypothyroidism     Past Surgical History:  Procedure Laterality Date  . ABDOMINAL AORTIC ANEURYSM REPAIR  08/2007  . ABDOMINAL AORTIC ANEURYSM REPAIR  11/2012   [facility]  . CARDIAC CAT[facility]TERIZATION  2008, 2011   [name]   . CORONARY ARTERY BYPASS GRAFT  2008  . CORONARY/GRAFT ANGIOGRAPHY N/A 06/05/2018   Procedure: CORONARY/GRAFT ANGIOGRAPHY;  Surgeon: Nelva Bush, MD;  Location: [place] CV LAB;  Service: Cardiovascular;  Laterality: N/A;  . EXPLORATORY LAPAROTOMY  1997  . HERNIA REPAIR    . JOINT REPLACEMENT     right knee  .  KNEE ARTHROPLASTY Left 09/19/2016   Procedure: COMPUTER ASSISTED TOTAL KNEE ARTHROPLASTY;  Surgeon: Dereck Leep, MD;  Location: [facility] ORS;  Service: Orthopedics;  Laterality: Left;  . TOTAL KNEE ARTHROPLASTY Right 09/2016     Current Outpatient Medications  Medication Sig Dispense Refill  . buPROPion (WELLBUTRIN SR) 200 MG 12 hr tablet Take 1 tablet (200 mg total) by mouth 2 (two) times daily. 180 tablet 1  . Cholecalciferol (D3 SUPER STRENGTH) 2000 UNITS CAPS Take 1 capsule by mouth daily.     . citalopram (CELEXA) 40 MG tablet Take 1 tablet (40 mg total) by  mouth daily. 90 tablet 0  . ELIQUIS 5 MG TABS tablet Take 1 tablet (5 mg total) by mouth 2 (two) times daily. 180 tablet 1  . levothyroxine (SYNTHROID) 50 MCG tablet Take 1 tablet (50 mcg total) by mouth daily. 90 tablet 3  . metoprolol tartrate (LOPRESSOR) 25 MG tablet Take 0.5 tablets (12.5 mg total) by mouth 2 (two) times daily. 90 tablet 1  . omeprazole (PRILOSEC) 20 MG capsule Take 1 capsule (20 mg total) by mouth daily. 90 capsule 1  . rosuvastatin (CRESTOR) 40 MG tablet Take 1 tablet (40 mg total) by mouth daily. 90 tablet 3  . tadalafil (CIALIS) 5 MG tablet Take 1 tablet (5 mg total) by mouth daily as needed for erectile dysfunction. 30 tablet 11  . tamsulosin (FLOMAX) 0.4 MG CAPS capsule Take 1 capsule (0.4 mg total) by mouth daily. 90 capsule 3   No current facility-administered medications for this visit.    Allergies:   Benzodiazepines, Codeine, and Tetracycline    Social History:  The patient  reports that he quit smoking about 14 years ago. His smoking use included cigarettes. He has a 35.00 pack-year smoking history. His smokeless tobacco use includes chew. He reports that he does not drink alcohol and does not use drugs.   Family History:  The patient's family history includes Depression in his maternal grandfather and maternal grandmother; Heart attack in his father, mother, and another family member; Heart disease in his mother and another family member; Hypertension in his father and mother; Kidney disease in his brother.    ROS:  Please see the history of present illness.   Otherwise, review of systems are positive for none.   All other systems are reviewed and negative.    PHYSICAL EXAM: VS:  BP (!) 90/60 (BP Location: Left Arm, Patient Position: Sitting, Cuff Size: Normal)   Pulse 77   Ht 6' (1.829 m)   Wt (!) 210 lb (95.3 kg)   SpO2 97%   BMI 28.48 kg/m  , BMI Body mass index is 28.48 kg/m. GEN: Well nourished, well developed, in no acute distress  HEENT:  normal  Neck: no JVD, carotid bruits, or masses Cardiac: RRR; no murmurs, rubs, or gallops,no edema  Respiratory:  clear to auscultation bilaterally, normal work of breathing GI: soft, nontender, nondistended, + BS MS: no deformity or atrophy  Skin: warm and dry, no rash Neuro:  Strength and sensation are intact Psych: euthymic mood, full affect   EKG:  EKG is ordered today. The ekg ordered today demonstrates normal sinus rhythm with sinus arrhythmia and left anterior fascicular block.    Recent Labs: 12/27/2019: ALT 13; BUN 20; Creatinine, Ser 1.28; Potassium 4.6; Sodium 142; TSH 3.140    Lipid Panel    Component Value Date/Time   CHOL 134 12/27/2019 1347   CHOL 159 12/18/2015 0827   TRIG 107 12/27/2019  1347   TRIG 166 (H) 12/18/2015 0827   HDL 52 12/27/2019 1347   CHOLHDL 3.4 09/25/2018 1208   VLDL 31 09/25/2018 1208   VLDL 33 (H) 12/18/2015 0827   LDLCALC 62 12/27/2019 1347      Wt Readings from Last 3 Encounters:  06/09/20 (!) 210 lb (95.3 kg)  03/13/20 214 lb 4.6 oz (97.2 kg)  12/02/19 214 lb 4 oz (97.2 kg)       ASSESSMENT AND PLAN:  1.  Coronary artery disease involving native coronary arteries without angina: He is doing well overall with no anginal symptoms.  Continue medical therapy  2.  Paroxysmal atrial fibrillation: Currently in sinus rhythm.  Given that his blood pressure is low, I elected to discontinue small dose metoprolol.  I discussed cheaper alternatives to Eliquis which currently include only warfarin.  He prefers to stay on Eliquis for now.  3.  Essential hypertension: His blood pressure has been running low recently and he seems to be symptomatic with dizziness.  I am going to check routine labs including CBC and basic metabolic profile.  I discontinued metoprolol.  4. Hyperlipidemia: Continue treatment with high-dose rosuvastatin.  Most recent LDL was 62.  5. Abdominal aortic aneurysm: Status post surgical repair. This is followed annually by  Dr. Lucky Cowboy.     Disposition:   FU with me in 6 months  Signed,  [name], MD  06/09/2020 3:42 PM    [facility]

## 2020-06-12 ENCOUNTER — Telehealth: Payer: Self-pay | Admitting: Cardiovascular Disease

## 2020-06-12 NOTE — Telephone Encounter (Signed)
Patient calling in for lab results from 7/27

## 2020-06-12 NOTE — Telephone Encounter (Signed)
See result note.  

## 2020-06-12 NOTE — Telephone Encounter (Signed)
-----   Message from Wellington Hampshire, MD sent at 06/12/2020  1:12 PM EDT ----- Inform patient that labs showed mildly elevated creatinine and BUN indicative of mild dehydration.  This might be causing low blood pressure and dizziness.  He should increase his fluid intake.

## 2020-06-12 NOTE — Telephone Encounter (Signed)
Lab results are awaiting the review and signature of the MD. Message fwd to Dr. Fletcher Anon.

## 2020-06-12 NOTE — Telephone Encounter (Signed)
Patient made aware of lab results and Dr. Arida's recommendation with verbalized understanding. °

## 2020-06-19 ENCOUNTER — Ambulatory Visit (INDEPENDENT_AMBULATORY_CARE_PROVIDER_SITE_OTHER): Payer: Medicare HMO

## 2020-06-19 ENCOUNTER — Ambulatory Visit (INDEPENDENT_AMBULATORY_CARE_PROVIDER_SITE_OTHER): Payer: Medicare HMO | Admitting: Nurse Practitioner

## 2020-06-19 ENCOUNTER — Other Ambulatory Visit: Payer: Self-pay

## 2020-06-19 ENCOUNTER — Encounter (INDEPENDENT_AMBULATORY_CARE_PROVIDER_SITE_OTHER): Payer: Self-pay | Admitting: Nurse Practitioner

## 2020-06-19 VITALS — BP 128/81 | HR 80 | Resp 16 | Wt 211.6 lb

## 2020-06-19 DIAGNOSIS — I714 Abdominal aortic aneurysm, without rupture, unspecified: Secondary | ICD-10-CM

## 2020-06-19 DIAGNOSIS — E785 Hyperlipidemia, unspecified: Secondary | ICD-10-CM

## 2020-06-19 DIAGNOSIS — I480 Paroxysmal atrial fibrillation: Secondary | ICD-10-CM | POA: Diagnosis not present

## 2020-06-19 NOTE — Progress Notes (Signed)
Subjective:    Patient ID: Stuart Harvey, male    DOB: Nov 11, 1951, 69 y.o.   MRN: [id number] Chief Complaint  Patient presents with  . Follow-up    ultrasound follow up    The patient returns to the office for surveillance of an abdominal aortic aneurysm status post stent graft placement in 2014.  The patient is also had a previous history of endoleak.  Patient denies abdominal pain or back pain, no other abdominal complaints. No groin related complaints. No symptoms consistent with distal embolization No changes in claudication distance.   There have been no interval changes in his overall healthcare since his last visit.   Patient denies amaurosis fugax or TIA symptoms. There is no history of claudication or rest pain symptoms of the lower extremities. The patient denies angina or shortness of breath.   Duplex US of the aorta and iliac arteries shows a 3.7 AAA sac with no endoleak, no change in the sac compared to the previous study.   Review of Systems  All other systems reviewed and are negative.      Objective:   Physical Exam Vitals reviewed.  HENT:     Head: Normocephalic.  Neck:     Vascular: No carotid bruit.  Cardiovascular:     Rate and Rhythm: Normal rate. Rhythm irregular.     Pulses: Normal pulses.  Pulmonary:     Effort: Pulmonary effort is normal.     Breath sounds: Normal breath sounds.  Musculoskeletal:        General: Normal range of motion.  Skin:    General: Skin is warm and dry.     Capillary Refill: Capillary refill takes more than 3 seconds.  Neurological:     Mental Status: He is alert and oriented to person, place, and time.  Psychiatric:        Mood and Affect: Mood normal.        Behavior: Behavior normal.        Thought Content: Thought content normal.        Judgment: Judgment normal.     BP 128/81 (BP Location: Left Arm)   Pulse 80   Resp 16   Wt 211 lb 9.6 oz (96 kg)   BMI 28.70 kg/m   Past Medical History:  Diagnosis  Date  . AAA (abdominal aortic aneurysm) ([place])    s/p surgical repair in 08/2007 by Dr. Hulda Humphrey  . Anemia   . Arthritis    both knees  . CKD (chronic kidney disease), stage II   . Coronary artery disease    a. 4-vCABG @ [facility] 06/2007 (LIMA-LAD, VG-RPDA, VG-OM3,VG-D2; b. LHC 2011: patent grafts EF 40%, c. LHC 06/05/18: LM nl, pLAD 50%, p-mLAD 50%, ostD1 70%, ost ramus 30%, ost-pLCx 40%, pLCx 100% (chronically occluded), OM3 100%, p-dRCA 100% (chronically occluded), LIMA-LAD patent, VG-RPDA patent, VG-OM3 mid-graft 60% and 80%  . Depression   . GERD (gastroesophageal reflux disease)   . Hernia of abdominal cavity   . Hyperlipidemia   . Hypertension   . Hypothyroidism     Social History   Socioeconomic History  . Marital status: Married    Spouse name: Not on file  . Number of children: Not on file  . Years of education: 44  . Highest education level: Some college, no degree  Occupational History  . Occupation: retired  Tobacco Use  . Smoking status: Former Smoker    Packs/day: 1.00    Years: 35.00  Pack years: 35.00    Types: Cigarettes    Quit date: 12/16/2005    Years since quitting: 14.5  . Smokeless tobacco: Current User    Types: Chew  . Tobacco comment: pt states he very rarely uses smokeless tobacco   Vaping Use  . Vaping Use: Never used  Substance and Sexual Activity  . Alcohol use: No  . Drug use: No  . Sexual activity: Yes    Partners: Female  Other Topics Concern  . Not on file  Social History Narrative  . Not on file   Social Determinants of Health   Financial Resource Strain:   . Difficulty of Paying Living Expenses:   Food Insecurity:   . Worried About Charity fundraiser in the Last Year:   . Arboriculturist in the Last Year:   Transportation Needs:   . Film/video editor (Medical):   Marland Kitchen Lack of Transportation (Non-Medical):   Physical Activity:   . Days of Exercise per Week:   . Minutes of Exercise per Session:   Stress:   . Feeling of  Stress :   Social Connections:   . Frequency of Communication with Friends and Family:   . Frequency of Social Gatherings with Friends and Family:   . Attends Religious Services:   . Active Member of Clubs or Organizations:   . Attends Archivist Meetings:   Marland Kitchen Marital Status:   Intimate Partner Violence:   . Fear of Current or Ex-Partner:   . Emotionally Abused:   Marland Kitchen Physically Abused:   . Sexually Abused:     Past Surgical History:  Procedure Laterality Date  . ABDOMINAL AORTIC ANEURYSM REPAIR  08/2007  . ABDOMINAL AORTIC ANEURYSM REPAIR  11/2012   [facility]  . CARDIAC CATHETERIZATION  2008, 2011   [facility]   . CORONARY ARTERY BYPASS GRAFT  2008  . CORONARY/GRAFT ANGIOGRAPHY N/A 06/05/2018   Procedure: CORONARY/GRAFT ANGIOGRAPHY;  Surgeon: Nelva Bush, MD;  Location: [facility];  Service: Cardiovascular;  Laterality: N/A;  . EXPLORATORY LAPAROTOMY  1997  . HERNIA REPAIR    . JOINT REPLACEMENT     right knee  . KNEE ARTHROPLASTY Left 09/19/2016   Procedure: COMPUTER ASSISTED TOTAL KNEE ARTHROPLASTY;  Surgeon: Dereck Leep, MD;  Location: [facility] ORS;  Service: Orthopedics;  Laterality: Left;  . TOTAL KNEE ARTHROPLASTY Right 09/2016    Family History  Problem Relation Age of Onset  . Heart disease Mother   . Heart attack Mother   . Hypertension Mother   . Heart attack Father   . Hypertension Father   . Kidney disease Brother   . Depression Maternal Grandmother   . Depression Maternal Grandfather   . Heart disease Other   . Heart attack Other   . Prostate cancer Neg Hx   . Kidney cancer Neg Hx   . Stomach cancer Neg Hx   . Colon cancer Neg Hx     Allergies  Allergen Reactions  . Benzodiazepines     Paradoxical agitation and delirium  . Codeine Nausea Only  . Tetracycline Rash       Assessment & Plan:   1. Abdominal aortic aneurysm (AAA) without rupture (HCC) Recommend: Patient is status post successful endovascular repair of the AAA.   No  further intervention is required at this time.   No endoleak is detected and the aneurysm sac is stable.  The patient will continue antiplatelet therapy as prescribed as well as aggressive  management of hyperlipidemia. Exercise is again strongly encouraged.   However, endografts require continued surveillance with ultrasound or CT scan. This is mandatory to detect any changes that allow repressurization of the aneurysm sac.  The patient is informed that this would be asymptomatic.  The patient is reminded that lifelong routine surveillance is a necessity with an endograft. Patient will continue to follow-up at 12 month intervals with ultrasound of the aorta.  2. Paroxysmal atrial fibrillation (HCC) Continue antiarrhythmia medications as already ordered, these medications have been reviewed and there are no changes at this time.  Continue anticoagulation as ordered by Cardiology Service   3. Hyperlipidemia, unspecified hyperlipidemia type Continue statin as ordered and reviewed, no changes at this time    Current Outpatient Medications on File Prior to Visit  Medication Sig Dispense Refill  . buPROPion (WELLBUTRIN SR) 200 MG 12 hr tablet Take 1 tablet (200 mg total) by mouth 2 (two) times daily. 180 tablet 1  . Cholecalciferol (D3 SUPER STRENGTH) 2000 UNITS CAPS Take 1 capsule by mouth daily.     . citalopram (CELEXA) 40 MG tablet Take 1 tablet (40 mg total) by mouth daily. 90 tablet 0  . ELIQUIS 5 MG TABS tablet Take 1 tablet (5 mg total) by mouth 2 (two) times daily. 180 tablet 1  . levothyroxine (SYNTHROID) 50 MCG tablet Take 1 tablet (50 mcg total) by mouth daily. 90 tablet 3  . omeprazole (PRILOSEC) 20 MG capsule Take 1 capsule (20 mg total) by mouth daily. 90 capsule 1  . rosuvastatin (CRESTOR) 40 MG tablet Take 1 tablet (40 mg total) by mouth daily. 90 tablet 3  . tadalafil (CIALIS) 5 MG tablet Take 1 tablet (5 mg total) by mouth daily as needed for erectile dysfunction. 30 tablet  11  . tamsulosin (FLOMAX) 0.4 MG CAPS capsule Take 1 capsule (0.4 mg total) by mouth daily. 90 capsule 3   No current facility-administered medications on file prior to visit.    There are no Patient Instructions on file for this visit. No follow-ups on file.   Kris Hartmann, NP

## 2020-06-26 ENCOUNTER — Ambulatory Visit: Payer: Medicare HMO | Admitting: Nurse Practitioner

## 2020-07-03 ENCOUNTER — Other Ambulatory Visit: Payer: Self-pay | Admitting: Unknown Physician Specialty

## 2020-07-03 ENCOUNTER — Encounter: Payer: Self-pay | Admitting: Nurse Practitioner

## 2020-07-03 ENCOUNTER — Telehealth: Payer: Self-pay | Admitting: *Deleted

## 2020-07-03 ENCOUNTER — Other Ambulatory Visit: Payer: Self-pay

## 2020-07-03 ENCOUNTER — Ambulatory Visit (INDEPENDENT_AMBULATORY_CARE_PROVIDER_SITE_OTHER): Payer: Medicare HMO | Admitting: Nurse Practitioner

## 2020-07-03 VITALS — BP 133/83 | HR 81 | Temp 98.5°F | Wt 212.0 lb

## 2020-07-03 DIAGNOSIS — I480 Paroxysmal atrial fibrillation: Secondary | ICD-10-CM | POA: Diagnosis not present

## 2020-07-03 DIAGNOSIS — F324 Major depressive disorder, single episode, in partial remission: Secondary | ICD-10-CM | POA: Diagnosis not present

## 2020-07-03 DIAGNOSIS — I714 Abdominal aortic aneurysm, without rupture, unspecified: Secondary | ICD-10-CM

## 2020-07-03 DIAGNOSIS — D692 Other nonthrombocytopenic purpura: Secondary | ICD-10-CM

## 2020-07-03 DIAGNOSIS — I1310 Hypertensive heart and chronic kidney disease without heart failure, with stage 1 through stage 4 chronic kidney disease, or unspecified chronic kidney disease: Secondary | ICD-10-CM

## 2020-07-03 DIAGNOSIS — I2510 Atherosclerotic heart disease of native coronary artery without angina pectoris: Secondary | ICD-10-CM

## 2020-07-03 DIAGNOSIS — N1831 Chronic kidney disease, stage 3a: Secondary | ICD-10-CM

## 2020-07-03 DIAGNOSIS — E782 Mixed hyperlipidemia: Secondary | ICD-10-CM

## 2020-07-03 DIAGNOSIS — E039 Hypothyroidism, unspecified: Secondary | ICD-10-CM

## 2020-07-03 MED ORDER — CITALOPRAM HYDROBROMIDE 20 MG PO TABS: 20.0000 mg | ORAL_TABLET | Freq: Every day | ORAL | 4 refills | Status: DC

## 2020-07-03 NOTE — Assessment & Plan Note (Signed)
Chronic, stable.  Continue current medication regimen.  Lipid panel today.  Will consider addition of Zetia as recommended by cardiology if LDL >70.  Discussed with patient. 

## 2020-07-03 NOTE — Assessment & Plan Note (Signed)
Followed by cardiology and vascular.  History of stent in 2014.  Continue to collaborate with team members. 

## 2020-07-03 NOTE — Assessment & Plan Note (Signed)
Chronic, ongoing.  Continue current medication regimen and adjust as needed.  Thyroid panel annually, currently stable levels.

## 2020-07-03 NOTE — Assessment & Plan Note (Signed)
Chronic, stable with BP at goal today for age.  Continue collaboration with cardiology, recent note reviewed and Metoprolol discontinued due to low BP and dizziness -- he reports feeling better now.  Recent BMP reviewed.  Recommend he check BP at home at least a few mornings a week.  Focus on DASH diet.  Return in 6 months.

## 2020-07-03 NOTE — Assessment & Plan Note (Signed)
Chronic, stable.  Continue Eliquis and statin.  Continue to collaborate with cardiology.  

## 2020-07-03 NOTE — Chronic Care Management (AMB) (Signed)
  Chronic Care Management   Outreach Note  07/03/2020 Name: PRADEEP BEAUBRUN MRN: [id number] DOB: December 09, 1950  [name] is a 69 y.o. year old male who is a primary care patient of Crissman, Jeannette How, MD. I reached out to [name] by phone today in response to a referral sent by Mr. Devantae Babe Beets's PCP, c     An unsuccessful telephone outreach was attempted today. The patient was referred to the case management team for assistance with care management and care coordination.   Patient called back I returned call to patient X2 and he did not answer and he does not have a voicemail.  Follow Up Plan: The care management team will reach out to the patient again over the next 7 days.  If patient returns call to provider office, please advise to call Garrett at [phone].  [place], [place] [address] Direct Dial: [phone] [email or web address]

## 2020-07-03 NOTE — Patient Instructions (Signed)

## 2020-07-03 NOTE — Assessment & Plan Note (Signed)
Chronic, ongoing with last GFR 48.  Recheck labs today.  Continue current medication regimen and adjust as needed.  Could consider addition of low dose ACE or ARB for kidney protection in future.  Recent BMP reviewed.

## 2020-07-03 NOTE — Assessment & Plan Note (Signed)
Bilateral upper extremity, takes Eliquis daily.   Continue to monitor for skin breakdown and encourage use of lotion daily to bilateral upper extremities. 

## 2020-07-03 NOTE — Progress Notes (Signed)
BP 133/83   Pulse 81   Temp 98.5 F (36.9 C) (Oral)   Wt 212 lb (96.2 kg)   SpO2 98%   BMI 28.75 kg/m    Subjective:    Patient ID: Stuart Harvey, male    DOB: 1951-08-20, 69 y.o.   MRN: [id number]  HPI: Stuart Harvey is a 69 y.o. male  Chief Complaint  Patient presents with  . Depression  . Hyperlipidemia  . Hypertension   HYPERTENSION / HYPERLIPIDEMIA/CKD 3 Continues on Crestor 40 MG.  Last saw cardiology 06/09/20, Metoprolol discontinued, recommend Zetia if his LDL remains > 70.  Last saw vascular in August 2021 for his AAA, had stent in 2014.   Satisfied with current treatment? yes Duration of hypertension: chronic BP monitoring frequency: not checking BP range:  BP medication side effects: no Duration of hyperlipidemia: chronic Cholesterol medication side effects: no Cholesterol supplements: none Medication compliance: good compliance Aspirin: no Recent stressors: no Recurrent headaches: no Visual changes: no Palpitations: no Dyspnea: no Chest pain: no Lower extremity edema: no Dizzy/lightheaded: no   ATRIAL FIBRILLATION Followed by cardiology.  Taking Eliquis -- is paying $400 every 3 months.  Does report concerns with cost, especially when in donut hole.   Atrial fibrillation status: stable Satisfied with current treatment: yes  Medication side effects:  no Medication compliance: good compliance Etiology of atrial fibrillation:  Palpitations:  no Chest pain:  no Dyspnea on exertion:  no Orthopnea:  no Syncope:  no Edema:  no Ventricular rate control: nothing Anti-coagulation: long acting  IFG: Last A1C in February 2021 was 5.8%. Polydipsia/polyuria: no Visual disturbance: no Chest pain: no Paresthesias: no   HYPOTHYROIDISM Last TSH 3.140 and T4 6.7 in February 2021, continues on Levothyroxine 50 MCG. Thyroid control status:stable Satisfied with current treatment? yes Medication side effects: no Medication compliance: good  compliance Etiology of hypothyroidism:  Recent dose adjustment:no Fatigue: no Cold intolerance: no Heat intolerance: no Weight gain: no Weight loss: no Constipation: no Diarrhea/loose stools: no Palpitations: no Lower extremity edema: no Anxiety/depressed mood: no   CHRONIC KIDNEY DISEASE Labs in July 2021 -- CRT 1.49 and GFR 48 CKD status: stable Medications renally dose: yes Previous renal evaluation: no Pneumovax:  Up to Date Influenza Vaccine:  Up to Date  DEPRESSION Continues on Wellbutrin 200 MG BID and Celexa 40 MG daily.  Discussed with him need to reduce Celexa to 20 MG due to age and risks of QT prolongation with 40 MG dosing, he agrees to reduction. Mood status: stable Satisfied with current treatment?: no Symptom severity: mild  Duration of current treatment : chronic Side effects: no Medication compliance: good compliance Psychotherapy/counseling: none Depressed mood: no Anxious mood: no Anhedonia: no Significant weight loss or gain: no Insomnia: occasionally hard to fall asleep Fatigue: no Feelings of worthlessness or guilt: no Impaired concentration/indecisiveness: no Suicidal ideations: no Hopelessness: no Crying spells: no Depression screen [facility] 2/9 07/03/2020 12/27/2019 07/15/2019 05/22/2019 02/19/2019  Decreased Interest 0 0 - 0 0  Down, Depressed, Hopeless 0 0 0 0 0  PHQ - 2 Score 0 0 0 0 0  Altered sleeping 1 0 - 0 1  Tired, decreased energy 1 0 - 0 0  Change in appetite 0 0 - 0 0  Feeling bad or failure about yourself  0 0 - 0 0  Trouble concentrating 0 0 - 0 0  Moving slowly or fidgety/restless 0 0 - 0 0  Suicidal thoughts 0 0 - 0 0  PHQ-9 Score 2 0 - 0 1  Difficult doing work/chores Not difficult at all Not difficult at all - Not difficult at all Not difficult at all   Relevant past medical, surgical, family and social history reviewed and updated as indicated. Interim medical history since our last visit reviewed. Allergies and medications  reviewed and updated.  Review of Systems  Constitutional: Negative for activity change, diaphoresis, fatigue and fever.  Respiratory: Negative for cough, chest tightness, shortness of breath and wheezing.   Cardiovascular: Negative for chest pain, palpitations and leg swelling.  Gastrointestinal: Negative.   Endocrine: Negative for cold intolerance, heat intolerance, polydipsia, polyphagia and polyuria.  Neurological: Negative.   Psychiatric/Behavioral: Negative.     Per HPI unless specifically indicated above     Objective:    BP 133/83   Pulse 81   Temp 98.5 F (36.9 C) (Oral)   Wt 212 lb (96.2 kg)   SpO2 98%   BMI 28.75 kg/m   Wt Readings from Last 3 Encounters:  07/03/20 212 lb (96.2 kg)  06/19/20 211 lb 9.6 oz (96 kg)  06/09/20 (!) 210 lb (95.3 kg)    Physical Exam Vitals and nursing note reviewed.  Constitutional:      General: He is awake. He is not in acute distress.    Appearance: He is well-developed. He is not ill-appearing.  HENT:     Head: Normocephalic and atraumatic.     Right Ear: Hearing normal. No drainage.     Left Ear: Hearing normal. No drainage.     Mouth/Throat:     Pharynx: Uvula midline.  Eyes:     General: Lids are normal.        Right eye: No discharge.        Left eye: No discharge.     Conjunctiva/sclera: Conjunctivae normal.     Pupils: Pupils are equal, round, and reactive to light.  Neck:     Thyroid: No thyromegaly.     Vascular: No carotid bruit or JVD.     Trachea: Trachea normal.  Cardiovascular:     Rate and Rhythm: Normal rate and regular rhythm.     Heart sounds: Normal heart sounds, S1 normal and S2 normal. No murmur heard.  No gallop.   Pulmonary:     Effort: Pulmonary effort is normal.     Breath sounds: Normal breath sounds.  Abdominal:     General: Bowel sounds are normal.     Palpations: Abdomen is soft. There is no hepatomegaly or splenomegaly.     Tenderness: There is no abdominal tenderness.   Musculoskeletal:        General: Normal range of motion.     Cervical back: Normal range of motion and neck supple.     Right lower leg: No edema.     Left lower leg: No edema.  Skin:    General: Skin is warm and dry.     Capillary Refill: Capillary refill takes less than 2 seconds.     Findings: No rash.     Comments: Scattered areas of pale purple bruises to bilateral upper extremities.  Intact skin.  Neurological:     Mental Status: He is alert and oriented to person, place, and time.     Deep Tendon Reflexes: Reflexes are normal and symmetric.  Psychiatric:        Mood and Affect: Mood normal.        Behavior: Behavior normal. Behavior is cooperative.  Thought Content: Thought content normal.        Judgment: Judgment normal.     Results for orders placed or performed during the hospital encounter of 06/09/20  CBC w/Diff  Result Value Ref Range   WBC 8.3 4.0 - 10.5 K/uL   RBC 4.83 4.22 - 5.81 MIL/uL   Hemoglobin 14.6 13.0 - 17.0 g/dL   HCT 42.9 39 - 52 %   MCV 88.8 80.0 - 100.0 fL   MCH 30.2 26.0 - 34.0 pg   MCHC 34.0 30.0 - 36.0 g/dL   RDW 14.4 11.5 - 15.5 %   Platelets 156 150 - 400 K/uL   nRBC 0.0 0.0 - 0.2 %   Neutrophils Relative % 44 %   Neutro Abs 3.8 1.7 - 7.7 K/uL   Lymphocytes Relative 43 %   Lymphs Abs 3.6 0.7 - 4.0 K/uL   Monocytes Relative 7 %   Monocytes Absolute 0.5 0 - 1 K/uL   Eosinophils Relative 5 %   Eosinophils Absolute 0.4 0 - 0 K/uL   Basophils Relative 1 %   Basophils Absolute 0.1 0 - 0 K/uL   Immature Granulocytes 0 %   Abs Immature Granulocytes 0.02 0.00 - 0.07 K/uL  Basic metabolic panel  Result Value Ref Range   Sodium 141 135 - 145 mmol/L   Potassium 4.3 3.5 - 5.1 mmol/L   Chloride 109 98 - 111 mmol/L   CO2 19 (L) 22 - 32 mmol/L   Glucose, Bld 100 (H) 70 - 99 mg/dL   BUN 35 (H) 8 - 23 mg/dL   Creatinine, Ser 1.49 (H) 0.61 - 1.24 mg/dL   Calcium 9.4 8.9 - 10.3 mg/dL   GFR calc non Af Amer 48 (L) >60 mL/min   GFR calc Af  Amer 55 (L) >60 mL/min   Anion gap 13 5 - 15      Assessment & Plan:   Problem List Items Addressed This Visit      Cardiovascular and Mediastinum   Coronary artery disease    Chronic, stable.  Continue Eliquis and statin.  Continue to collaborate with cardiology.       Hypertensive heart/kidney disease without HF and with CKD stage III    Chronic, stable with BP at goal today for age.  Continue collaboration with cardiology, recent note reviewed and Metoprolol discontinued due to low BP and dizziness -- he reports feeling better now.  Recent BMP reviewed.  Recommend he check BP at home at least a few mornings a week.  Focus on DASH diet.  Return in 6 months.      AAA (abdominal aortic aneurysm) ([place])    Followed by cardiology and vascular.  History of stent in 2014.  Continue to collaborate with team members.      Atrial fibrillation (HCC)    Chronic, ongoing with Metoprolol discontinued by cardiology recently.  Continue  collaboration with cardiology + Eliquis, recent note reviewed + labs.  CBC next visit, annually.  CCM referral to look into assistance with Eliquis cost.  Return in 6 months.      Relevant Orders   Referral to Chronic Care Management Services   Senile purpura (HCC)    Bilateral upper extremity, takes Eliquis daily.   Continue to monitor for skin breakdown and encourage use of lotion daily to bilateral upper extremities.        Endocrine   Hypothyroid    Chronic, ongoing.  Continue current medication regimen and adjust as needed.  Thyroid panel annually, currently stable levels.        Genitourinary   CKD (chronic kidney disease) stage 3, GFR 30-59 ml/min    Chronic, ongoing with last GFR 48.  Recheck labs today.  Continue current medication regimen and adjust as needed.  Could consider addition of low dose ACE or ARB for kidney protection in future.  Recent BMP reviewed.          Other   Hyperlipidemia    Chronic, stable.  Continue current medication  regimen.  Lipid panel today.  Will consider addition of Zetia as recommended by cardiology if LDL >70.  Discussed with patient.      Relevant Orders   Lipid Panel w/o Chol/HDL Ratio   Depression - Primary    Chronic, stable with PHQ 9 = 2.  Will reduce Celexa to 20 MG due to age >34 and risks with 40 MG dosing, patient agrees with this plan, continue Wellbutrin.  Denies SI/HI.  Return in 6 months.      Relevant Medications   citalopram (CELEXA) 20 MG tablet       Follow up plan: Return in about 6 months (around 01/03/2021) for HTN/HLD, A-Fib, MOOD.

## 2020-07-03 NOTE — Telephone Encounter (Signed)
   Notes to clinic: patient has appointment today    Requested Prescriptions  Pending Prescriptions Disp Refills   citalopram (CELEXA) 40 MG tablet [Pharmacy Med Name: CITALOPRAM HBR 40 MG TABLET] 90 tablet 0    Sig: Take 1 tablet (40 mg total) by mouth daily.      Psychiatry:  Antidepressants - SSRI Failed - 07/03/2020 11:10 AM      Failed - Valid encounter within last 6 months    Recent Outpatient Visits           6 months ago Abdominal aortic aneurysm (AAA) without rupture ([place])   [place] Cannady, Henrine Screws T, NP   7 months ago Lab test positive for detection of COVID-19 virus   [place], [place] T, NP   8 months ago Lab test positive for detection of COVID-19 virus   [facility] [place], [place] T, NP   1 year ago Hypertensive heart/kidney disease without HF and with CKD stage III ([place])   [place] Cannady, [name], NP   1 year ago Paroxysmal atrial fibrillation ([place])   [place], Barbaraann Faster, NP       Future Appointments             Today Venita Lick, NP [facility]   In 2 months Sninsky, Herbert Seta, MD [place]   In 5 months Arida, Mertie Clause, MD [place]            Passed - Completed PHQ-2 or PHQ-9 in the last 360 days.

## 2020-07-03 NOTE — Assessment & Plan Note (Signed)
Chronic, ongoing with Metoprolol discontinued by cardiology recently.  Continue  collaboration with cardiology + Eliquis, recent note reviewed + labs.  CBC next visit, annually.  CCM referral to look into assistance with Eliquis cost.  Return in 6 months.

## 2020-07-03 NOTE — Assessment & Plan Note (Signed)
Chronic, stable with PHQ 9 = 2.  Will reduce Celexa to 20 MG due to age >5 and risks with 40 MG dosing, patient agrees with this plan, continue Wellbutrin.  Denies SI/HI.  Return in 6 months.

## 2020-07-04 LAB — LIPID PANEL W/O CHOL/HDL RATIO
Cholesterol, Total: 143 mg/dL (ref 100–199)
HDL: 48 mg/dL (ref >39)
LDL Chol Calc (NIH): 77 mg/dL (ref 0–99)
Triglycerides: 97 mg/dL (ref 0–149)
VLDL Cholesterol Cal: 18 mg/dL (ref 5–40)

## 2020-07-05 NOTE — Progress Notes (Signed)
Please let Stuart Harvey know his labs have returned and his cholesterol levels continue to be stable with LDL close to goal.  Continue current Rosuvastatin dosing.  Have a great day!!

## 2020-07-06 NOTE — Chronic Care Management (AMB) (Signed)
  Chronic Care Management   Note  07/06/2020 Name: COBIE MARCOUX MRN: [id number] DOB: May 28, 1951  PADEN SENGER is a 69 y.o. year old male who is a primary care patient of Crissman, Jeannette How, MD. I reached out to [name] by phone today in response to a referral sent by Mr. Elton Heid Fitzmaurice's PCP, Guadalupe Maple, MD.     Mr. Dorey was given information about Chronic Care Management services today including:  1. CCM service includes personalized support from designated clinical staff supervised by his physician, including individualized plan of care and coordination with other care providers 2. 24/7 contact phone numbers for assistance for urgent and routine care needs. 3. Service will only be billed when office clinical staff spend 20 minutes or more in a month to coordinate care. 4. Only one practitioner may furnish and bill the service in a calendar month. 5. The patient may stop CCM services at any time (effective at the end of the month) by phone call to the office staff. 6. The patient will be responsible for cost sharing (co-pay) of up to 20% of the service fee (after annual deductible is met).  Patient agreed to services and verbal consent obtained.   Follow up plan: Telephone appointment with care management team member scheduled for: 07/27/2020.  [place], [place] [address] Direct Dial: [phone] [email or web address]: [email or web address]

## 2020-07-22 ENCOUNTER — Other Ambulatory Visit: Payer: Self-pay | Admitting: Nurse Practitioner

## 2020-07-27 ENCOUNTER — Ambulatory Visit: Payer: Medicare HMO | Admitting: Pharmacist

## 2020-07-27 NOTE — Progress Notes (Signed)
Chronic Care Management Pharmacy  Name: Stuart Harvey  MRN: [id number] DOB: 1951/07/23   Chief Complaint/ HPI  [name],  69 y.o. , male presents for their Initial CCM visit with the clinical pharmacist via telephone due to COVID-19 Pandemic.  PCP : Guadalupe Maple, MD Patient Care Team: Guadalupe Maple, MD as PCP - General (Family Medicine) Kathrine Haddock, NP as PCP - Family Medicine (Nurse Practitioner) Wellington Hampshire, MD as PCP - Cardiology (Cardiology) Lucky Cowboy Erskine Squibb, MD as Referring Physician (Vascular Surgery) Wellington Hampshire, MD as Consulting Physician (Cardiology) Vladimir Faster, [facility] (Pharmacist)  Their chronic conditions include: Hypertension, Hyperlipidemia, Atrial Fibrillation, Coronary Artery Disease and Chronic Kidney Disease   Office Visits: 07/03/20- Marnee Guarneri, NP- bloodwork, citalopram dose decrease  Consult Visit: 06/09/20-Dr. Fletcher Anon, Cardiology- D/C metoprolol bp 90/60  Allergies  Allergen Reactions  . Benzodiazepines     Paradoxical agitation and delirium  . Codeine Nausea Only  . Tetracycline Rash    Medications: Outpatient Encounter Medications as of 07/27/2020  Medication Sig  . buPROPion (WELLBUTRIN SR) 200 MG 12 hr tablet Take 1 tablet (200 mg total) by mouth 2 (two) times daily.  . Cholecalciferol (D3 SUPER STRENGTH) 2000 UNITS CAPS Take 1 capsule by mouth daily.   . citalopram (CELEXA) 20 MG tablet Take 1 tablet (20 mg total) by mouth daily.  Marland Kitchen ELIQUIS 5 MG TABS tablet Take 1 tablet (5 mg total) by mouth 2 (two) times daily.  Marland Kitchen levothyroxine (SYNTHROID) 50 MCG tablet Take 1 tablet (50 mcg total) by mouth daily.  Marland Kitchen omeprazole (PRILOSEC) 20 MG capsule Take 1 capsule (20 mg total) by mouth daily.  . rosuvastatin (CRESTOR) 40 MG tablet Take 1 tablet (40 mg total) by mouth daily.  . tamsulosin (FLOMAX) 0.4 MG CAPS capsule Take 1 capsule (0.4 mg total) by mouth daily.  . tadalafil (CIALIS) 5 MG tablet Take 1 tablet (5 mg total) by  mouth daily as needed for erectile dysfunction.   No facility-administered encounter medications on file as of 07/27/2020.    Wt Readings from Last 3 Encounters:  07/03/20 212 lb (96.2 kg)  06/19/20 211 lb 9.6 oz (96 kg)  06/09/20 (!) 210 lb (95.3 kg)    Current Diagnosis/Assessment:    Goals Addressed            This Visit's Progress   . PharmD "My Eliquis is too expensive"       CARE PLAN ENTRY (see longitudinal plan of care for additional care plan information)  Current Barriers:  . Chronic Disease Management support, education, and care coordination needs related to Hypertension, Hyperlipidemia, and Atrial Fibrillation   Hypertension CKD stage 3 BP Readings from Last 3 Encounters:  07/03/20 133/83  06/19/20 128/81  06/09/20 (!) 90/60   . Pharmacist Clinical Goal(s): o Over the next 90 days, patient will work with PharmD and providers to maintain BP goal <130/80 . Current regimen:  o None (recent metoprolol d/c due to  hypotension 90/60 by cards) . Interventions: . Comprehensive medication review performed, medication list updated in electronic medical record . Inter-disciplinary care team collaboration (see longitudinal plan of care) o  . Patient self care activities - Over the next 90 days, patient will: o Check BP three times weekly, document, and provide at future appointments o Ensure daily salt intake < 2300 mg/day  Hyperlipidemia Lab Results  Component Value Date/Time   LDLCALC 77 07/03/2020 01:57 PM   . Pharmacist Clinical Goal(s): o Over  the next 90 days, patient will work with PharmD and providers to achieve LDL goal < 70 . Current regimen:  o Rosuvastatin 40 mg qd . Interventions: . Comprehensive medication review performed, medication list updated in electronic medical record . Inter-disciplinary care team collaboration (see longitudinal plan of care) o Discussed possible addition of ezetemibe if LDL continues above goal . Patient self care  activities - Over the next 90 days, patient will: o Practice DASH eating plan o Aim for 30 minutes of exercise 5 days per week. A FIB . Pharmacist Clinical Goal(s) o Over the next 90 days, patient will work with PharmD and providers to eliminate financial barriers related to Eliquis . Current regimen:  o Eliquis 5 mg bid . Interventions: . Comprehensive medication review performed, medication list updated in electronic medical record . Inter-disciplinary care team collaboration (see longitudinal plan of care) o Will initiate PAP process for Eliquis and help patient obtain sufficient quantity until this can be completed. . Patient self care activities - Over the next 90 days, patient will: o Complete required documentation portion of PAP.  Medication management . Pharmacist Clinical Goal(s): o Over the next 90 days, patient will work with PharmD and providers to achieve optimal medication adherence . Current pharmacy: [facility]  . Interventions o Comprehensive medication review performed. o Continue current medication management strategy . Patient self care activities - Over the next 90 days, patient will: o Focus on medication adherence by continuing current adherence measures o Take medications as prescribed o Report any questions or concerns to PharmD and/or provider(s)  Initial goal documentation       Hypertension with CKD III   BP goal is:  <130/80  Office blood pressures are  BP Readings from Last 3 Encounters:  07/03/20 133/83  06/19/20 128/81  06/09/20 (!) 90/60   CMP Latest Ref Rng & Units 06/09/2020 12/27/2019 05/22/2019  Glucose 70 - 99 mg/dL 100(H) 100(H) 104(H)  BUN 8 - 23 mg/dL 35(H) 20 29(H)  Creatinine 0.61 - 1.24 mg/dL 1.49(H) 1.28(H) 1.31(H)  Sodium 135 - 145 mmol/L 141 142 143  Potassium 3.5 - 5.1 mmol/L 4.3 4.6 4.6  Chloride 98 - 111 mmol/L 109 109(H) 106  CO2 22 - 32 mmol/L 19(L) 20 18(L)  Calcium 8.9 - 10.3 mg/dL 9.4 9.2 9.4  Total Protein  6.0 - 8.5 g/dL - 6.4 7.1  Total Bilirubin 0.0 - 1.2 mg/dL - 0.3 0.4  Alkaline Phos 39 - 117 IU/L - 103 102  AST 0 - 40 IU/L - 28 24  ALT 0 - 44 IU/L - 13 12   eGFR~ 48  Patient checks BP at home when feeling symptomatic Patient home BP readings are ranging: unknown  Patient has failed these meds in the past: metoprolol succinate 12.5 mg bid recently d/c by cards due to bp 90/60 Patient is currently controlled on the following medications:  . NONE  We discussed importance of checking BP at home to ensure BP remains controlled off metoprolol. Mr. Ceesay has AWV on 08/03/20 and will check at least 3 days, document and discuss with nurse. He states his wife checks for him when he feels "dizzy".  He denies checking BP since last appointment with Jolene.    Plan  Continue control with diet and exercise and monitor home BP readings.    Hyperlipidemia   LDL goal < 70  Lipid Panel     Component Value Date/Time   CHOL 143 07/03/2020 1357   CHOL 159  12/18/2015 0827   TRIG 97 07/03/2020 1357   TRIG 166 (H) 12/18/2015 0827   HDL 48 07/03/2020 1357   LDLCALC 77 07/03/2020 1357    Hepatic Function Latest Ref Rng & Units 12/27/2019 05/22/2019 09/25/2018  Total Protein 6.0 - 8.5 g/dL 6.4 7.1 7.1  Albumin 3.8 - 4.8 g/dL 3.9 4.4 3.8  AST 0 - 40 IU/L 28 24 28   ALT 0 - 44 IU/L 13 12 18   Alk Phosphatase 39 - 117 IU/L 103 102 94  Total Bilirubin 0.0 - 1.2 mg/dL 0.3 0.4 0.7  Bilirubin, Direct 0.0 - 0.2 mg/dL - - <0.1     The ASCVD Risk score (Lyons., et al., 2013) failed to calculate for the following reasons:   The patient has a prior MI or stroke diagnosis   Patient has failed these meds in past: rosuvastatin 5, 10 and 20 mg Patient is currently controlled on the following medications:  . Rosuvastatin 40 mg qd  We discussed:  diet and exercise extensively. Mr. Vaca stays active working on cars in his garage but does not have an exercise routine. We reviewed LDL goals and slight upward  trend since February. He denies any missed doses.  Plan  Continue current medications. Recommend repeat lipid panel at next visit and consider adding PCSK9 or ezetemibe if still above goal.   AFIB   Patient is currently neither rate or rhythm controlled.   Patient has failed these meds in past: metoprolol succinate recently d/c due to BP 90/60 Patient is currently controlled on the following medications:   Eliquis 5 mg bid  We discussed:  Signs and symptoms of bleeding. Importance of taking as scheduled. Patient reports his copay is > $500 for a 90 day supply. CPA has starting the PAP process. Patient only has a 2 week supply remaining. Mr. Reier has previously reached out to cardiology office for alternative therapy. Warfarin was suggested and he declined after learning of the many interactions and the monitoring requirements. I counseled that can not take Eliquis once daily as this drug does not last long enough in the body and would leave him vulnerable to cardiovascular events including blood clots, stroke and heart attack.  Plan  Continue current medications and complete PAP documentation.  Hypothyroidism   Lab Results  Component Value Date/Time   TSH 3.140 12/27/2019 01:47 PM   TSH 4.720 (H) 05/22/2019 11:49 AM   FREET4 1.28 06/07/2018 05:11 AM    Patient has failed these meds in past: N/A Patient is currently controlled on the following medications:  . Levothyroxine 50 mcg qd  We discussed:  Patient endorses taking levothyroxine along with other AM meds each morning. Most reading lab value at goal and he denies symptoms. Patient to continue taking as he has been.  Plan  Continue current medications. Recommend follow-up level at  Depression   Patient has failed these meds in past: Not available. Patient is currently controlled on the following medications:  . Citalopram 20 mg qd ( taking 1/2 of 40 mg tablet) . Bupropion SR 230m q12  We discussed:  Patient is well  controlled. Most recent PHQ9=2. Citalopram dose decreased from 479mto 20 mg at office visit 07/03/20. He reports tolerating well.  Plan  Continue current medications  Vaccines   Reviewed and discussed patient's vaccination history.    Immunization History  Administered Date(s) Administered  . Janssen (J&J) SARS-COV-2 Vaccination 03/09/2020  . Pneumococcal Conjugate-13 07/01/2016  . Pneumococcal Polysaccharide-23 10/07/2009, 07/07/2017  .  Tdap 06/16/2015  . Zoster 06/16/2015    Plan  Recommended patient receive Shingrix vaccine at local pharmacy and flu vaccine when available in office.    Medication Management   [facility] for all medications Uses pill box? No - keeps together in a tote Pt endorses 98 % compliance  We discussed: Patient reports rarely missing doses (estimates once every 6 weeks). He understands that he should not double on doses if dose is missed.   Plan  Continue current medication management strategy    Follow up: 3 month phone visit.  Junita Push. Kenton Kingfisher PharmD, [facility] [phone]

## 2020-07-28 ENCOUNTER — Telehealth: Payer: Self-pay | Admitting: Pharmacist

## 2020-07-28 NOTE — Patient Instructions (Addendum)
Visit Information:  Stuart Harvey,  It was a pleasure speaking with you today! Please monitor your blood pressure daily, document and provide at annual wellness visit on 9/20. My team have already started the paperwork for patient assistance with Eliquis copay.  Thank you for letting me be a part of your care team. Please call with any questions or concerns [phone].      Goals Addressed            This Visit's Progress   . PharmD "My Eliquis is too expensive"       CARE PLAN ENTRY (see longitudinal plan of care for additional care plan information)  Current Barriers:  . Chronic Disease Management support, education, and care coordination needs related to Hypertension, Hyperlipidemia, and Atrial Fibrillation   Hypertension CKD stage 3 BP Readings from Last 3 Encounters:  07/03/20 133/83  06/19/20 128/81  06/09/20 (!) 90/60   . Pharmacist Clinical Goal(s): o Over the next 90 days, patient will work with PharmD and providers to maintain BP goal <130/80 . Current regimen:  o None (recent metoprolol d/c due to  hypotension 90/60 by cards) . Interventions: . Comprehensive medication review performed, medication list updated in electronic medical record . Inter-disciplinary care team collaboration (see longitudinal plan of care) o  . Patient self care activities - Over the next 90 days, patient will: o Check BP three times weekly, document, and provide at future appointments o Ensure daily salt intake < 2300 mg/day  Hyperlipidemia Lab Results  Component Value Date/Time   LDLCALC 77 07/03/2020 01:57 PM   . Pharmacist Clinical Goal(s): o Over the next 90 days, patient will work with PharmD and providers to achieve LDL goal < 70 . Current regimen:  o Rosuvastatin 40 mg qd . Interventions: . Comprehensive medication review performed, medication list updated in electronic medical record . Inter-disciplinary care team collaboration (see longitudinal plan of care) o Discussed  possible addition of ezetemibe if LDL continues above goal . Patient self care activities - Over the next 90 days, patient will: o Practice DASH eating plan o Aim for 30 minutes of exercise 5 days per week. A FIB . Pharmacist Clinical Goal(s) o Over the next 90 days, patient will work with PharmD and providers to eliminate financial barriers related to Eliquis . Current regimen:  o Eliquis 5 mg bid . Interventions: . Comprehensive medication review performed, medication list updated in electronic medical record . Inter-disciplinary care team collaboration (see longitudinal plan of care) o Will initiate PAP process for Eliquis and help patient obtain sufficient quantity until this can be completed. . Patient self care activities - Over the next 90 days, patient will: o Complete required documentation portion of PAP.  Medication management . Pharmacist Clinical Goal(s): o Over the next 90 days, patient will work with PharmD and providers to achieve optimal medication adherence . Current pharmacy: [facility]  . Interventions o Comprehensive medication review performed. o Continue current medication management strategy . Patient self care activities - Over the next 90 days, patient will: o Focus on medication adherence by continuing current adherence measures o Take medications as prescribed o Report any questions or concerns to PharmD and/or provider(s)  Initial goal documentation        Stuart Harvey was given information about Chronic Care Management services today including:  1. CCM service includes personalized support from designated clinical staff supervised by his physician, including individualized plan of care and coordination with other care providers 2. 24/7 contact phone  numbers for assistance for urgent and routine care needs. 3. Standard insurance, coinsurance, copays and deductibles apply for chronic care management only during months in which we provide at least 20  minutes of these services. Most insurances cover these services at 100%, however patients may be responsible for any copay, coinsurance and/or deductible if applicable. This service may help you avoid the need for more expensive face-to-face services. 4. Only one practitioner may furnish and bill the service in a calendar month. 5. The patient may stop CCM services at any time (effective at the end of the month) by phone call to the office staff.  Patient agreed to services and verbal consent obtained.   The patient verbalized understanding of instructions provided today and agreed to receive a mailed copy of patient instruction and/or educational materials. Telephone follow up appointment with pharmacy team member scheduled for: 10/30/20  Junita Push. Brennen Gardiner PharmD, BCPS Clinical Pharmacist [phone]  Preventing Atrial Fibrillation-Related Stroke  Atrial fibrillation is a common type of irregular or rapid heartbeat (arrhythmia) that greatly increases your risk for a stroke. In atrial fibrillation, the top portions of the heart (atria) beat out of sync with the lower portions of the heart. When the muscles of the atria are tightening in an uncoordinated way (fibrillating), blood can pool in the heart and form clots. If a clot travels to the brain, it can cause a stroke. This type of stroke is preventable. Understanding atrial fibrillation and knowing how to properly manage it can prevent you from having a stroke. What increases my risk for a stroke? If you have atrial fibrillation, you may be at increased risk for a stroke if you also:  Have heart failure.  Have high blood pressure.  Are older than age 55.  Have diabetes.  Have a history of vascular disease, such as heart attack or stroke.  Are male. If you have atrial fibrillation and you also have one or more of those risk factors, talk with your health care provider about treatments that can prevent a stroke. Other risk factors for  a stroke include:  Smoking.  High cholesterol.  Diabetes.  Being inactive (sedentary lifestyle).  Having a family history of stroke.  Eating a diet that is high in fat, cholesterol, and salt. What treatments help to manage atrial fibrillation? The main goals of treatment for atrial fibrillation are to prevent blood clots from forming and to keep your heart beating at a normal rate and rhythm. Treatment may include:  Blood-thinning medicine (anticoagulant) that helps to prevent clots from forming. This medicine also increases the risk of bleeding. Talk with your health care provider about the risks and benefits of taking anticoagulants.  Medicine that slows the heart rate or brings the heart rhythm back to normal.  Electrical cardioversion. This is a procedure that resets the heart's rhythm by delivering a controlled, low-energy shock through your skin to your heart.  An ablation procedure, such as catheter ablation, catheter ablation with pacemaker, or surgical ablation. These procedures destroy the heart tissues that send abnormal signals so that heart rhythms can be improved or made normal. A pacemaker is a device that is placed under the skin to help the heart beat in a regular rhythm. How can I prevent atrial fibrillation-related stroke? Medicines  Take over-the-counter and prescription medicines only as told by your health care provider.  If your health care provider prescribed an anticoagulant, take it exactly as told. Taking too much blood-thinning medicine can cause bleeding. If you do  not take enough blood-thinning medicine, you will not have the protection that you need against stroke and other problems. Eating and drinking  Eat healthy foods, including at least 5 servings of fruits and vegetables a day.  Do not drink alcohol.  Do not drink beverages that contain caffeine, such as coffee, soda, and tea.  Follow dietary instructions as told by your health care  provider. Managing other medical conditions  Manage and be aware of your blood pressure. If you have high blood pressure, follow your treatment plan to keep it in your target range.  Have your cholesterol checked as often as recommended by your health care provider. If you have high cholesterol, follow your treatment plan to lower it and keep it in your target range.  Talk with your health care provider about symptoms to watch for. Some people may not have any symptoms, so it can be hard to know that they have atrial fibrillation. Talk with your health care provider if you experience: ? A feeling that your heart is beating rapidly or irregularly. ? An irregular pulse. ? A feeling of discomfort or pain in your chest. ? Shortness of breath. ? Sudden light-headedness or weakness. ? Tiredness (fatigue) that happens easily during exercise.  If you have obstructive sleep apnea (OSA), manage your condition as told by your health care provider. General instructions  Maintain a healthy weight. Do not use diet pills unless your health care provider approves. Diet pills may make heart problems worse.  Exercise regularly. Get at least 30 minutes of activity on most or all days, or as told by your health care provider.  Do not use any products that contain nicotine or tobacco, such as cigarettes and e-cigarettes. If you need help quitting, ask your health care provider.  Do not use drugs, such as cocaine and amphetamines.  Keep all follow-up visits as told by your health care providers. This is important. These include visits with your heart specialist. Where to find more information You may find more information about preventing atrial fibrillation-related stroke from:  National Stroke Association (AFib-Stroke Connection): www.stroke.org Contact a health care provider if:  You notice a change in the rate, rhythm, or strength of your heartbeat.  You have dizziness.  You are taking an  anticoagulant and you have more bruises than usual.  You tire out more easily when you exercise or do similar activities. Get help right away if:   You have chest pain.  You have pain in your abdomen.  You experience unusual sweating or weakness.  You take anticoagulants and you: ? Have severe headaches or confusion. ? Have blood in your vomit, bowel movement, or urine. ? Have bleeding that will not stop. ? Fall or injure your head.  You have any symptoms of a stroke. "BE FAST" is an easy way to remember the main warning signs of a stroke: ? B - Balance. Signs are dizziness, trouble walking, or loss of balance. ? E - Eyes. Signs are trouble seeing or a sudden change in vision. ? F - Face. Signs are sudden weakness or numbness of the face, or the face or eyelid drooping on one side. ? A - Arms. Signs are weakness or numbness in an arm. This happens suddenly and usually on one side of the body. ? S - Speech. Signs are sudden trouble speaking, slurred speech, or trouble understanding what people say. ? T - Time. Time to call emergency services. Write down what time symptoms started.  You  have other signs of a stroke, such as: ? A sudden, severe headache with no known cause. ? Nausea or vomiting. ? Seizure. These symptoms may represent a serious problem that is an emergency. Do not wait to see if the symptoms will go away. Get medical help right away. Call your local emergency services (911 in the [place] Do not drive yourself to the hospital. Summary  Having atrial fibrillation increases the risk for a stroke. Talk with your health care provider about what symptoms to watch for.  Atrial fibrillation-related stroke is preventable. Proper management of atrial fibrillation can prevent you from having a stroke.  Talk with your health care provider about whether anticoagulant medicine is right for you.  Learn the warning signs of a stroke and remember "BE FAST." This information is not  intended to replace advice given to you by your health care provider. Make sure you discuss any questions you have with your health care provider. Document Revised: 02/25/2019 Document Reviewed: 02/15/2017 Elsevier Patient Education  [place].

## 2020-08-03 ENCOUNTER — Telehealth: Payer: Self-pay | Admitting: Nurse Practitioner

## 2020-08-03 ENCOUNTER — Telehealth: Payer: Self-pay | Admitting: Cardiovascular Disease

## 2020-08-03 ENCOUNTER — Ambulatory Visit (INDEPENDENT_AMBULATORY_CARE_PROVIDER_SITE_OTHER): Payer: Medicare HMO

## 2020-08-03 VITALS — BP 163/89 | HR 76 | Ht 72.0 in | Wt 210.0 lb

## 2020-08-03 DIAGNOSIS — Z0000 Encounter for general adult medical examination without abnormal findings: Secondary | ICD-10-CM

## 2020-08-03 NOTE — Patient Instructions (Signed)
[name] , Thank you for taking time to come for your Medicare Wellness Visit. I appreciate your ongoing commitment to your health goals. Please review the following plan we discussed and let me know if I can assist you in the future.   Screening recommendations/referrals: Colonoscopy: completed 08/31/2017 Recommended yearly ophthalmology/optometry visit for glaucoma screening and checkup Recommended yearly dental visit for hygiene and checkup  Vaccinations: Influenza vaccine: decline Pneumococcal vaccine: completed 07/07/2017 Tdap vaccine: completed 06/20/2015 Shingles vaccine: discussed   Covid-19:  03/09/2020  Advanced directives: Please bring a copy of your POA (Power of Attorney) and/or Living Will to your next appointment.   Conditions/risks identified: uses smokeless tobacco  Next appointment: Follow up in one year for your annual wellness visit.   Preventive Care 19 Years and Older, Male Preventive care refers to lifestyle choices and visits with your health care provider that can promote health and wellness. What does preventive care include?  A yearly physical exam. This is also called an annual well check.  Dental exams once or twice a year.  Routine eye exams. Ask your health care provider how often you should have your eyes checked.  Personal lifestyle choices, including:  Daily care of your teeth and gums.  Regular physical activity.  Eating a healthy diet.  Avoiding tobacco and drug use.  Limiting alcohol use.  Practicing safe sex.  Taking low doses of aspirin every day.  Taking vitamin and mineral supplements as recommended by your health care provider. What happens during an annual well check? The services and screenings done by your health care provider during your annual well check will depend on your age, overall health, lifestyle risk factors, and family history of disease. Counseling  Your health care provider may ask you questions about  your:  Alcohol use.  Tobacco use.  Drug use.  Emotional well-being.  Home and relationship well-being.  Sexual activity.  Eating habits.  History of falls.  Memory and ability to understand (cognition).  Work and work Statistician. Screening  You may have the following tests or measurements:  Height, weight, and BMI.  Blood pressure.  Lipid and cholesterol levels. These may be checked every 5 years, or more frequently if you are over 14 years old.  Skin check.  Lung cancer screening. You may have this screening every year starting at age 19 if you have a 30-pack-year history of smoking and currently smoke or have quit within the past 15 years.  Fecal occult blood test (FOBT) of the stool. You may have this test every year starting at age 80.  Flexible sigmoidoscopy or colonoscopy. You may have a sigmoidoscopy every 5 years or a colonoscopy every 10 years starting at age 25.  Prostate cancer screening. Recommendations will vary depending on your family history and other risks.  Hepatitis C blood test.  Hepatitis B blood test.  Sexually transmitted disease (STD) testing.  Diabetes screening. This is done by checking your blood sugar (glucose) after you have not eaten for a while (fasting). You may have this done every 1-3 years.  Abdominal aortic aneurysm (AAA) screening. You may need this if you are a current or former smoker.  Osteoporosis. You may be screened starting at age 7 if you are at high risk. Talk with your health care provider about your test results, treatment options, and if necessary, the need for more tests. Vaccines  Your health care provider may recommend certain vaccines, such as:  Influenza vaccine. This is recommended every year.  Tetanus, diphtheria, and acellular pertussis (Tdap, Td) vaccine. You may need a Td booster every 10 years.  Zoster vaccine. You may need this after age 3.  Pneumococcal 13-valent conjugate (PCV13) vaccine.  One dose is recommended after age 82.  Pneumococcal polysaccharide (PPSV23) vaccine. One dose is recommended after age 60. Talk to your health care provider about which screenings and vaccines you need and how often you need them. This information is not intended to replace advice given to you by your health care provider. Make sure you discuss any questions you have with your health care provider. Document Released: 11/27/2015 Document Revised: 07/20/2016 Document Reviewed: 09/01/2015 Elsevier Interactive Patient Education  2017 [place] Prevention in the Home Falls can cause injuries. They can happen to people of all ages. There are many things you can do to make your home safe and to help prevent falls. What can I do on the outside of my home?  Regularly fix the edges of walkways and driveways and fix any cracks.  Remove anything that might make you trip as you walk through a door, such as a raised step or threshold.  Trim any bushes or trees on the path to your home.  Use bright outdoor lighting.  Clear any walking paths of anything that might make someone trip, such as rocks or tools.  Regularly check to see if handrails are loose or broken. Make sure that both sides of any steps have handrails.  Any raised decks and porches should have guardrails on the edges.  Have any leaves, snow, or ice cleared regularly.  Use sand or salt on walking paths during winter.  Clean up any spills in your garage right away. This includes oil or grease spills. What can I do in the bathroom?  Use night lights.  Install grab bars by the toilet and in the tub and shower. Do not use towel bars as grab bars.  Use non-skid mats or decals in the tub or shower.  If you need to sit down in the shower, use a plastic, non-slip stool.  Keep the floor dry. Clean up any water that spills on the floor as soon as it happens.  Remove soap buildup in the tub or shower regularly.  Attach bath  mats securely with double-sided non-slip rug tape.  Do not have throw rugs and other things on the floor that can make you trip. What can I do in the bedroom?  Use night lights.  Make sure that you have a light by your bed that is easy to reach.  Do not use any sheets or blankets that are too big for your bed. They should not hang down onto the floor.  Have a firm chair that has side arms. You can use this for support while you get dressed.  Do not have throw rugs and other things on the floor that can make you trip. What can I do in the kitchen?  Clean up any spills right away.  Avoid walking on wet floors.  Keep items that you use a lot in easy-to-reach places.  If you need to reach something above you, use a strong step stool that has a grab bar.  Keep electrical cords out of the way.  Do not use floor polish or wax that makes floors slippery. If you must use wax, use non-skid floor wax.  Do not have throw rugs and other things on the floor that can make you trip. What can I do  with my stairs?  Do not leave any items on the stairs.  Make sure that there are handrails on both sides of the stairs and use them. Fix handrails that are broken or loose. Make sure that handrails are as long as the stairways.  Check any carpeting to make sure that it is firmly attached to the stairs. Fix any carpet that is loose or worn.  Avoid having throw rugs at the top or bottom of the stairs. If you do have throw rugs, attach them to the floor with carpet tape.  Make sure that you have a light switch at the top of the stairs and the bottom of the stairs. If you do not have them, ask someone to add them for you. What else can I do to help prevent falls?  Wear shoes that:  Do not have high heels.  Have rubber bottoms.  Are comfortable and fit you well.  Are closed at the toe. Do not wear sandals.  If you use a stepladder:  Make sure that it is fully opened. Do not climb a closed  stepladder.  Make sure that both sides of the stepladder are locked into place.  Ask someone to hold it for you, if possible.  Clearly mark and make sure that you can see:  Any grab bars or handrails.  First and last steps.  Where the edge of each step is.  Use tools that help you move around (mobility aids) if they are needed. These include:  Canes.  Walkers.  Scooters.  Crutches.  Turn on the lights when you go into a dark area. Replace any light bulbs as soon as they burn out.  Set up your furniture so you have a clear path. Avoid moving your furniture around.  If any of your floors are uneven, fix them.  If there are any pets around you, be aware of where they are.  Review your medicines with your doctor. Some medicines can make you feel dizzy. This can increase your chance of falling. Ask your doctor what other things that you can do to help prevent falls. This information is not intended to replace advice given to you by your health care provider. Make sure you discuss any questions you have with your health care provider. Document Released: 08/27/2009 Document Revised: 04/07/2016 Document Reviewed: 12/05/2014 Elsevier Interactive Patient Education  2017 [employer].

## 2020-08-03 NOTE — Telephone Encounter (Signed)
Spoke with the patient. Patient sts that his BP has been consistently elevated for >1 week. Patient did not provide specific readings and sts that it has been running in the 160's/170's/100's.  Patient sts that today while at the pharmacy to pick up his medications the pharmacist rechecked his BP and it was 172/100 86bpm.  Patient complains of some lightheadiness and denies any other symptoms.  I asked the patient to recheck his BP while I held the line and it was 165/107 91 bpm.  He was last seen by Dr. Fletcher Anon in July 2021 and his Metoprolol 12.5 mg bid was d/c due to hypotension. He does still have 25 mg Metoprolol tablets on hand. Adv him to take one tablet 25mg  now and recheck his BP 2-3 hours after.  Advised the patient that I will fwd an update to Dr. Fletcher Anon for further instruction. Advised the patient that he is to call back in the interim if needed.  Patient agreeable with poc and voiced appreciation for the assistance.

## 2020-08-03 NOTE — Telephone Encounter (Signed)
Pt, states his BP is 172/100 checked at [facility] [address] pharmacist told him to let PCP know, Pt did not want to Schedule  apt refused multiple apt times due to no availability today , Pt just wanted to let PCP know and stated he would call heart doctor. Pt was advised if worse go to ed or urgent care.

## 2020-08-03 NOTE — Progress Notes (Addendum)
I connected with Stuart Harvey today by telephone and verified that I am speaking with the correct person using two identifiers. Location patient: home Location provider: work Persons participating in the virtual visit: Stuart Harvey, Glenna Durand LPN.   I discussed the limitations, risks, security and privacy concerns of performing an evaluation and management service by telephone and the availability of in person appointments. I also discussed with the patient that there may be a patient responsible charge related to this service. The patient expressed understanding and verbally consented to this telephonic visit.    Interactive audio and video telecommunications were attempted between this provider and patient, however failed, due to patient having technical difficulties OR patient did not have access to video capability.  We continued and completed visit with audio only.     Vital signs may be patient reported or missing.  Subjective:   Stuart Harvey is a 69 y.o. male who presents for Medicare Annual/Subsequent preventive examination.  Review of Systems     Cardiac Risk Factors include: advanced age (>6men, >28 women);hypertension;male gender;smoking/ tobacco exposure     Objective:    Today's Vitals   08/03/20 1112  BP: (!) 163/89  Pulse: 76  Weight: 210 lb (95.3 kg)  Height: 6' (1.829 m)   Body mass index is 28.48 kg/m.  Advanced Directives 08/03/2020 03/13/2020 07/15/2019 07/12/2018 06/03/2018 06/02/2018 07/05/2017  Does Patient Have a Medical Advance Directive? Yes Yes Yes Yes Yes No Yes  Type of Paramedic of [place];Living will - Living will;Healthcare Power of [place];Living will [place];Living will - [place];Living will  Does patient want to make changes to medical advance directive? - - - - No - Patient declined - -  Copy of [place] in Chart? No - copy  requested - No - copy requested No - copy requested No - copy requested - No - copy requested  Would patient like information on creating a medical advance directive? - - - - No - Patient declined No - Patient declined -    Current Medications (verified) Outpatient Encounter Medications as of 08/03/2020  Medication Sig  . buPROPion (WELLBUTRIN SR) 200 MG 12 hr tablet Take 1 tablet (200 mg total) by mouth 2 (two) times daily.  . Cholecalciferol (D3 SUPER STRENGTH) 2000 UNITS CAPS Take 1 capsule by mouth daily.   . citalopram (CELEXA) 20 MG tablet Take 1 tablet (20 mg total) by mouth daily.  Marland Kitchen ELIQUIS 5 MG TABS tablet Take 1 tablet (5 mg total) by mouth 2 (two) times daily.  Marland Kitchen levothyroxine (SYNTHROID) 50 MCG tablet Take 1 tablet (50 mcg total) by mouth daily.  Marland Kitchen omeprazole (PRILOSEC) 20 MG capsule Take 1 capsule (20 mg total) by mouth daily.  . rosuvastatin (CRESTOR) 40 MG tablet Take 1 tablet (40 mg total) by mouth daily.  . tamsulosin (FLOMAX) 0.4 MG CAPS capsule Take 1 capsule (0.4 mg total) by mouth daily.  . tadalafil (CIALIS) 5 MG tablet Take 1 tablet (5 mg total) by mouth daily as needed for erectile dysfunction. (Patient not taking: Reported on 08/03/2020)   No facility-administered encounter medications on file as of 08/03/2020.    Allergies (verified) Benzodiazepines, Codeine, and Tetracycline   History: Past Medical History:  Diagnosis Date  . AAA (abdominal aortic aneurysm) ([place])    s/p surgical repair in 08/2007 by Dr. Hulda Humphrey  . Anemia   . Arthritis    both knees  .  CKD (chronic kidney disease), stage II   . Coronary artery disease    a. 4-vCABG @ [place] 06/2007 (LIMA-LAD, VG-RPDA, VG-OM3,VG-D2; b. LHC 2011: patent grafts EF 40%, c. LHC 06/05/18: LM nl, pLAD 50%, p-mLAD 50%, ostD1 70%, ost ramus 30%, ost-pLCx 40%, pLCx 100% (chronically occluded), OM3 100%, p-dRCA 100% (chronically occluded), LIMA-LAD patent, VG-RPDA patent, VG-OM3 mid-graft 60% and 80%  . Depression   . GERD  (gastroesophageal reflux disease)   . Hernia of abdominal cavity   . Hyperlipidemia   . Hypertension   . Hypothyroidism    Past Surgical History:  Procedure Laterality Date  . ABDOMINAL AORTIC ANEURYSM REPAIR  08/2007  . ABDOMINAL AORTIC ANEURYSM REPAIR  11/2012   [facility]  . CARDIAC CATHETERIZATION  2008, 2011   [facility]   . CORONARY ARTERY BYPASS GRAFT  2008  . CORONARY/GRAFT ANGIOGRAPHY N/A 06/05/2018   Procedure: CORONARY/GRAFT ANGIOGRAPHY;  Surgeon: Nelva Bush, MD;  Location: [facility];  Service: Cardiovascular;  Laterality: N/A;  . EXPLORATORY LAPAROTOMY  1997  . HERNIA REPAIR    . JOINT REPLACEMENT     right knee  . KNEE ARTHROPLASTY Left 09/19/2016   Procedure: COMPUTER ASSISTED TOTAL KNEE ARTHROPLASTY;  Surgeon: Dereck Leep, MD;  Location: [facility] ORS;  Service: Orthopedics;  Laterality: Left;  . TOTAL KNEE ARTHROPLASTY Right 09/2016   Family History  Problem Relation Age of Onset  . Heart disease Mother   . Heart attack Mother   . Hypertension Mother   . Heart attack Father   . Hypertension Father   . Kidney disease Brother   . Depression Maternal Grandmother   . Depression Maternal Grandfather   . Heart disease Other   . Heart attack Other   . Prostate cancer Neg Hx   . Kidney cancer Neg Hx   . Stomach cancer Neg Hx   . Colon cancer Neg Hx    Social History   Socioeconomic History  . Marital status: Married    Spouse name: Not on file  . Number of children: Not on file  . Years of education: 29  . Highest education level: Some college, no degree  Occupational History  . Occupation: retired  Tobacco Use  . Smoking status: Former Smoker    Packs/day: 1.00    Years: 35.00    Pack years: 35.00    Types: Cigarettes    Quit date: 12/16/2005    Years since quitting: 14.6  . Smokeless tobacco: Current User    Types: Chew  . Tobacco comment: pt states he very rarely uses smokeless tobacco   Vaping Use  . Vaping Use: Never used  Substance and  Sexual Activity  . Alcohol use: No  . Drug use: No  . Sexual activity: Yes    Partners: Female  Other Topics Concern  . Not on file  Social History Narrative  . Not on file   Social Determinants of Health   Financial Resource Strain: Low Risk   . Difficulty of Paying Living Expenses: Not hard at all  Food Insecurity: No Food Insecurity  . Worried About Charity fundraiser in the Last Year: Never true  . Ran Out of Food in the Last Year: Never true  Transportation Needs: No Transportation Needs  . Lack of Transportation (Medical): No  . Lack of Transportation (Non-Medical): No  Physical Activity: Inactive  . Days of Exercise per Week: 0 days  . Minutes of Exercise per Session: 0 min  Stress: No Stress  Concern Present  . Feeling of Stress : Not at all  Social Connections:   . Frequency of Communication with Friends and Family: Not on file  . Frequency of Social Gatherings with Friends and Family: Not on file  . Attends Religious Services: Not on file  . Active Member of Clubs or Organizations: Not on file  . Attends Archivist Meetings: Not on file  . Marital Status: Not on file    Tobacco Counseling Ready to quit: Not Answered Counseling given: Not Answered Comment: pt states he very rarely uses smokeless tobacco    Clinical Intake:  Pre-visit preparation completed: Yes  Pain : No/denies pain     Nutritional Status: BMI 25 -29 Overweight Nutritional Risks: None Diabetes: No  How often do you need to have someone help you when you read instructions, pamphlets, or other written materials from your doctor or pharmacy?: 1 - Never What is the last grade level you completed in school?: 57yr college  Diabetic? no  Interpreter Needed?: No  Information entered by :: NAllen LPN   Activities of Daily Living In your present state of health, do you have any difficulty performing the following activities: 08/03/2020  Hearing? Y  Vision? N  Difficulty  concentrating or making decisions? N  Walking or climbing stairs? N  Dressing or bathing? N  Doing errands, shopping? N  Preparing Food and eating ? N  Using the Toilet? N  In the past six months, have you accidently leaked urine? Y  Comment maybe twice  Do you have problems with loss of bowel control? N  Managing your Medications? N  Managing your Finances? N  Housekeeping or managing your Housekeeping? N  Some recent data might be hidden    Patient Care Team: Kathrine Haddock, NP as PCP - Family Medicine (Nurse Practitioner) Wellington Hampshire, MD as PCP - Cardiology (Cardiology) Lucky Cowboy Erskine Squibb, MD as Referring Physician (Vascular Surgery) Wellington Hampshire, MD as Consulting Physician (Cardiology) Vladimir Faster, [facility] (Pharmacist)  Indicate any recent Medical Services you may have received from other than [facility] providers in the past year (date may be approximate).     Assessment:   This is a routine wellness examination for [facility].  Hearing/Vision screen  Hearing Screening   125Hz  250Hz  500Hz  1000Hz  2000Hz  3000Hz  4000Hz  6000Hz  8000Hz   Right ear:           Left ear:           Vision Screening Comments: Regular eye exams, Dr. Ellin Mayhew  Dietary issues and exercise activities discussed: Current Exercise Habits: The patient does not participate in regular exercise at present  Goals    . DIET - INCREASE WATER INTAKE     Recommend drinking at least 6-8 glasses of water a day     . Patient Stated     08/03/2020,stay healthy    . PharmD "My Eliquis is too expensive"     CARE PLAN ENTRY (see longitudinal plan of care for additional care plan information)  Current Barriers:  . Chronic Disease Management support, education, and care coordination needs related to Hypertension, Hyperlipidemia, and Atrial Fibrillation   Hypertension CKD stage 3 BP Readings from Last 3 Encounters:  07/03/20 133/83  06/19/20 128/81  06/09/20 (!) 90/60   . Pharmacist Clinical Goal(s): o Over the  next 90 days, patient will work with PharmD and providers to maintain BP goal <130/80 . Current regimen:  o None (recent metoprolol d/c due to  hypotension 90/60  by cards) . Interventions: . Comprehensive medication review performed, medication list updated in electronic medical record . Inter-disciplinary care team collaboration (see longitudinal plan of care) o  . Patient self care activities - Over the next 90 days, patient will: o Check BP three times weekly, document, and provide at future appointments o Ensure daily salt intake < 2300 mg/day  Hyperlipidemia Lab Results  Component Value Date/Time   LDLCALC 77 07/03/2020 01:57 PM   . Pharmacist Clinical Goal(s): o Over the next 90 days, patient will work with PharmD and providers to achieve LDL goal < 70 . Current regimen:  o Rosuvastatin 40 mg qd . Interventions: . Comprehensive medication review performed, medication list updated in electronic medical record . Inter-disciplinary care team collaboration (see longitudinal plan of care) o Discussed possible addition of ezetemibe if LDL continues above goal . Patient self care activities - Over the next 90 days, patient will: o Practice DASH eating plan o Aim for 30 minutes of exercise 5 days per week. A FIB . Pharmacist Clinical Goal(s) o Over the next 90 days, patient will work with PharmD and providers to eliminate financial barriers related to Eliquis . Current regimen:  o Eliquis 5 mg bid . Interventions: . Comprehensive medication review performed, medication list updated in electronic medical record . Inter-disciplinary care team collaboration (see longitudinal plan of care) o Will initiate PAP process for Eliquis and help patient obtain sufficient quantity until this can be completed. . Patient self care activities - Over the next 90 days, patient will: o Complete required documentation portion of PAP.  Medication management . Pharmacist Clinical Goal(s): o Over the  next 90 days, patient will work with PharmD and providers to achieve optimal medication adherence . Current pharmacy: [facility]  . Interventions o Comprehensive medication review performed. o Continue current medication management strategy . Patient self care activities - Over the next 90 days, patient will: o Focus on medication adherence by continuing current adherence measures o Take medications as prescribed o Report any questions or concerns to PharmD and/or provider(s)  Initial goal documentation     . Quit smoking / using tobacco     Tobacco cessation discussed      Depression Screen PHQ 2/9 Scores 08/03/2020 07/03/2020 12/27/2019 07/15/2019 05/22/2019 02/19/2019 08/20/2018  PHQ - 2 Score 0 0 0 0 0 0 0  PHQ- 9 Score 0 2 0 - 0 1 1    Fall Risk Fall Risk  08/03/2020 07/15/2019 05/22/2019 02/19/2019 08/20/2018  Falls in the past year? 0 0 0 - Yes  Number falls in past yr: - - 0 0 1  Injury with Fall? - - 0 0 Yes  Risk Factor Category  - - - - -  Risk for fall due to : Medication side effect - - - -  Follow up Falls evaluation completed;Education provided;Falls prevention discussed - Falls evaluation completed - Falls evaluation completed    Any stairs in or around the home? Yes  If so, are there any without handrails? No  Home free of loose throw rugs in walkways, pet beds, electrical cords, etc? Yes  Adequate lighting in your home to reduce risk of falls? Yes   ASSISTIVE DEVICES UTILIZED TO PREVENT FALLS:  Life alert? No  Use of a cane, walker or w/c? No  Grab bars in the bathroom? No  Shower chair or bench in shower? Yes  Elevated toilet seat or a handicapped toilet? No   TIMED UP AND GO:  Was the test performed? No .     Cognitive Function: MMSE - Mini Mental State Exam 07/01/2016  Orientation to time 5  Orientation to Place 5  Registration 3  Attention/ Calculation 5  Recall 2  Language- name 2 objects 2  Language- repeat 1  Language- follow 3 step command 3    Language- read & follow direction 1  Write a sentence 1  Copy design 1  Total score 29     6CIT Screen 08/03/2020 07/12/2018 07/05/2017  What Year? 0 points 0 points 0 points  What month? 0 points 0 points 0 points  What time? 0 points 0 points 0 points  Count back from 20 0 points 0 points 0 points  Months in reverse 0 points 0 points 0 points  Repeat phrase 0 points 0 points 0 points  Total Score 0 0 0    Immunizations Immunization History  Administered Date(s) Administered  . Janssen (J&J) SARS-COV-2 Vaccination 03/09/2020  . Pneumococcal Conjugate-13 07/01/2016  . Pneumococcal Polysaccharide-23 10/07/2009, 07/07/2017  . Tdap 06/16/2015  . Zoster 06/16/2015    TDAP status: Up to date Flu Vaccine status: Declined, Education has been provided regarding the importance of this vaccine but patient still declined. Advised may receive this vaccine at local pharmacy or Health Dept. Aware to provide a copy of the vaccination record if obtained from local pharmacy or Health Dept. Verbalized acceptance and understanding. Pneumococcal vaccine status: Up to date Covid-19 vaccine status: Completed vaccines  Qualifies for Shingles Vaccine? Yes   Zostavax completed Yes   Shingrix Completed?: No.    Education has been provided regarding the importance of this vaccine. Patient has been advised to call insurance company to determine out of pocket expense if they have not yet received this vaccine. Advised may also receive vaccine at local pharmacy or Health Dept. Verbalized acceptance and understanding.  Screening Tests Health Maintenance  Topic Date Due  . INFLUENZA VACCINE  02/11/2021 (Originally 06/14/2020)  . COLONOSCOPY  08/31/2020  . TETANUS/TDAP  06/15/2025  . COVID-19 Vaccine  Completed  . Hepatitis C Screening  Completed  . PNA vac Low Risk Adult  Completed    Health Maintenance  There are no preventive care reminders to display for this patient.  Colorectal cancer screening:  Completed 08/31/2017. Repeat every 3 years  Lung Cancer Screening: (Low Dose CT Chest recommended if Age 60-80 years, 30 pack-year currently smoking OR have quit w/in 15years.) does not qualify.   Lung Cancer Screening Referral: no  Additional Screening:  Hepatitis C Screening: does qualify; Completed 08/30/2018  Vision Screening: Recommended annual ophthalmology exams for early detection of glaucoma and other disorders of the eye. Is the patient up to date with their annual eye exam?  Yes  Who is the provider or what is the name of the office in which the patient attends annual eye exams? Dr. Ellin Mayhew If pt is not established with a provider, would they like to be referred to a provider to establish care? No .   Dental Screening: Recommended annual dental exams for proper oral hygiene  Community Resource Referral / Chronic Care Management: CRR required this visit?  No   CCM required this visit?  No      Plan:     I have personally reviewed and noted the following in the patient's chart:   . Medical and social history . Use of alcohol, tobacco or illicit drugs  . Current medications and supplements . Functional ability and status .  Nutritional status . Physical activity . Advanced directives . List of other physicians . Hospitalizations, surgeries, and ER visits in previous 12 months . Vitals . Screenings to include cognitive, depression, and falls . Referrals and appointments  In addition, I have reviewed and discussed with patient certain preventive protocols, quality metrics, and best practice recommendations. A written personalized care plan for preventive services as well as general preventive health recommendations were provided to patient.     Kellie Simmering, LPN   0/24/0973   Nurse Notes:

## 2020-08-03 NOTE — Telephone Encounter (Signed)
Pt c/o BP issue: STAT if pt c/o blurred vision, one-sided weakness or slurred speech  1. What are your last 5 BP readings? Today 09/20 - 172/100  2. Are you having any other symptoms (ex. Dizziness, headache, blurred vision, passed out)? Lightheadedness  3. What is your BP issue? Patient states that his BP is very high, he is concerned and he would like to discuss.

## 2020-08-03 NOTE — Telephone Encounter (Signed)
Noted  

## 2020-08-03 NOTE — Telephone Encounter (Signed)
Routing to provider, FYI.  

## 2020-08-04 MED ORDER — METOPROLOL TARTRATE 25 MG PO TABS: 12.5000 mg | ORAL_TABLET | Freq: Two times a day (BID) | ORAL | 2 refills | Status: DC

## 2020-08-04 NOTE — Telephone Encounter (Signed)
Discussed with Dr. Fletcher Anon. Per Dr. Fletcher Anon he should resume Metoprolol 12.5 mg bid. Adv the patient that I will send an Rx to his pharmacy. Adv the pt to check his BP regularly. He is to contact the office if his BP remains consistently elevated or drops to low.  Patient verbalized understanding ands voiced appreciation for the assistance.

## 2020-08-04 NOTE — Telephone Encounter (Signed)
Patient called back this morning to report that his BP this morning is 126/77 53pm.  He did take 25 mg of Metoprolol yesterday afternoon as instructed. He has not taken any Metoprolol this morning.  Adv that I will fwd the update to Dr. Fletcher Anon and call back with further instruction.

## 2020-08-04 NOTE — Progress Notes (Signed)
  08/05/20-No answer and unable to lvm for Eliquis representative Lonia Mad to ask for any suggestions in assistance to patient.

## 2020-08-05 ENCOUNTER — Telehealth: Payer: Self-pay | Admitting: Pharmacist

## 2020-08-05 NOTE — Progress Notes (Signed)
08/05/20-Spoke to patient in regards of patient assistance coordination for his Eliquis where he states he would like to speak with Birdena Crandall, CPP for clarification on which route to take whether he pays out of pocket through his current insurance carrier or from a different resource with cheaper costs so he can receive his Eliquis medication. Per Birdena Crandall, CPP; she will speak to patient's cardiology provider and see if patient can be switched from Eliquis to [place].

## 2020-08-07 ENCOUNTER — Telehealth: Payer: Self-pay | Admitting: Pharmacist

## 2020-08-07 NOTE — Progress Notes (Signed)
  Chronic Care Management   Note  08/07/2020 Name: TAITEN BRAWN MRN: [id number] DOB: October 03, 1951    Attempted to call patient today to discuss switching to Xarelto as unable to identify any available cost savings programs for Eliquis.Patient household income exceeds the allowable amount for PAP.  I have also called the [phone] and live representative unable to offer any guidance.     Unable to leave message for patient. Will try again next week.  Junita Push. Kenton Kingfisher PharmD, [facility] [phone]

## 2020-08-13 ENCOUNTER — Telehealth: Payer: Self-pay | Admitting: Pharmacist

## 2020-08-13 NOTE — Progress Notes (Signed)
  Chronic Care Management   Note  08/13/2020 Name: Stuart Harvey MRN: [id number] DOB: 1951-10-20     I spoke with Stuart Harvey today regarding Stuart Harvey Select program for Xarelto.  He is agreeable to this option. I will reach out to Dr. Fletcher Anon - Cardiology for approval.    Junita Push. Kenton Kingfisher PharmD, [facility] [phone]

## 2020-08-14 ENCOUNTER — Telehealth: Payer: Self-pay | Admitting: Pharmacist

## 2020-08-14 NOTE — Progress Notes (Addendum)
    Chronic Care Management Pharmacy Assistant   Name: Stuart Harvey  MRN: [id number] DOB: 1951-06-11  Reason for Encounter: Medication Review  PCP : No primary care provider on file.  Allergies:   Allergies  Allergen Reactions   Benzodiazepines     Paradoxical agitation and delirium   Codeine Nausea Only   Tetracycline Rash    Medications: Outpatient Encounter Medications as of 08/14/2020  Medication Sig   buPROPion (WELLBUTRIN SR) 200 MG 12 hr tablet Take 1 tablet (200 mg total) by mouth 2 (two) times daily.   Cholecalciferol (D3 SUPER STRENGTH) 2000 UNITS CAPS Take 1 capsule by mouth daily.    citalopram (CELEXA) 20 MG tablet Take 1 tablet (20 mg total) by mouth daily.   ELIQUIS 5 MG TABS tablet Take 1 tablet (5 mg total) by mouth 2 (two) times daily.   levothyroxine (SYNTHROID) 50 MCG tablet Take 1 tablet (50 mcg total) by mouth daily.   metoprolol tartrate (LOPRESSOR) 25 MG tablet Take 0.5 tablets (12.5 mg total) by mouth 2 (two) times daily.   omeprazole (PRILOSEC) 20 MG capsule Take 1 capsule (20 mg total) by mouth daily.   rosuvastatin (CRESTOR) 40 MG tablet Take 1 tablet (40 mg total) by mouth daily.   No facility-administered encounter medications on file as of 08/14/2020.    Current Diagnosis: Patient Active Problem List   Diagnosis Date Noted   History of MI (myocardial infarction) 12/27/2019   Senile purpura ([place]) 08/20/2018   Atrial fibrillation ([place]) 06/14/2018   Elevated PSA 07/10/2017   Varicose veins of both lower extremities with inflammation 06/13/2017   Counseling regarding advanced directives and goals of care 01/02/2017   CKD (chronic kidney disease) stage 3, GFR 30-59 ml/min (HCC) 12/18/2015   IFG (impaired fasting glucose) 12/18/2015   Hypothyroid 12/18/2015   Renal mass 08/17/2015   H/O total knee replacement 08/28/2014   Arthritis of knee, degenerative 05/05/2014   Depression 08/28/2012   Coronary artery disease    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 Stuart Harvey also states he would like to reconnect with Birdena Crandall in regards of his wife Stuart Harvey) who has a co-pay dilemma for Rx [name] (eye drops) that is costing around $1750; Stuart Reams, PA prescribes at [facility]. Patient states wife has [employer] coverage currently but will be transitioning to Medicare Part D at the end of year once she retires. Will need to touch base with Birdena Crandall, CPP for other options to assist Mr. & Stuart Harvey.   Please call the program directly to clarify. Per my review, patient should qualify if Cardiology approves switch from Eliquis. I have reached out to Dr. Fletcher Anon for approval.  Junita Push. Kenton Kingfisher PharmD, [facility] [phone]

## 2020-08-17 ENCOUNTER — Other Ambulatory Visit: Payer: Self-pay | Admitting: Physician Assistant

## 2020-08-17 NOTE — Telephone Encounter (Signed)
Rx request sent to pharmacy.  

## 2020-08-18 ENCOUNTER — Telehealth: Payer: Self-pay | Admitting: Pharmacist

## 2020-08-18 NOTE — Progress Notes (Signed)
    Chronic Care Management Pharmacy Assistant   Name: BEXTON HAAK  MRN: [id number] DOB: 1951/04/14  Reason for Encounter: Medication Review   PCP : No primary care provider on file.  Allergies:   Allergies  Allergen Reactions  . Benzodiazepines     Paradoxical agitation and delirium  . Codeine Nausea Only  . Tetracycline Rash    Medications: Outpatient Encounter Medications as of 08/18/2020  Medication Sig  . buPROPion (WELLBUTRIN SR) 200 MG 12 hr tablet Take 1 tablet (200 mg total) by mouth 2 (two) times daily.  . Cholecalciferol (D3 SUPER STRENGTH) 2000 UNITS CAPS Take 1 capsule by mouth daily.   . citalopram (CELEXA) 20 MG tablet Take 1 tablet (20 mg total) by mouth daily.  Marland Kitchen ELIQUIS 5 MG TABS tablet Take 1 tablet (5 mg total) by mouth 2 (two) times daily.  Marland Kitchen levothyroxine (SYNTHROID) 50 MCG tablet Take 1 tablet (50 mcg total) by mouth daily.  . metoprolol tartrate (LOPRESSOR) 25 MG tablet Take 0.5 tablets (12.5 mg total) by mouth 2 (two) times daily.  Marland Kitchen omeprazole (PRILOSEC) 20 MG capsule Take 1 capsule (20 mg total) by mouth daily.  . rosuvastatin (CRESTOR) 40 MG tablet Take 1 tablet (40 mg total) by mouth daily.   No facility-administered encounter medications on file as of 08/18/2020.    Current Diagnosis: Patient Active Problem List   Diagnosis Date Noted  . History of MI (myocardial infarction) 12/27/2019  . Senile purpura ([place]) 08/20/2018  . Atrial fibrillation ([place]) 06/14/2018  . Elevated PSA 07/10/2017  . Varicose veins of both lower extremities with inflammation 06/13/2017  . Counseling regarding advanced directives and goals of care 01/02/2017  . CKD (chronic kidney disease) stage 3, GFR 30-59 ml/min (HCC) 12/18/2015  . IFG (impaired fasting glucose) 12/18/2015  . Hypothyroid 12/18/2015  . Renal mass 08/17/2015  . H/O total knee replacement 08/28/2014  . Arthritis of knee, degenerative 05/05/2014  . Depression 08/28/2012  . Coronary artery disease     . Hypertensive heart/kidney disease without HF and with CKD stage III ([place])   . Hyperlipidemia   . AAA (abdominal aortic aneurysm) ([place])     08/18/20-Called and spoke to representative Corliss Blacker) from [employer]; he states [facility] will be the service provider who receives all information submitted thru registration. [facility] will then call physician to verify medication dosage, day supply, whether patient is in the donut hole, and any other further information needed to coordinate care. Also, representative states [facility] is the selected pharmacy who receives the Rx orders and directly ships to patient. Engineer, maintenance Program for any questions [phone]. Birdena [place], CPP notified.   Follow-Up:  Patient Assistance Coordination and Pharmacist Review    Raynelle [place], [place] Assistant [phone]

## 2020-08-20 ENCOUNTER — Telehealth: Payer: Self-pay | Admitting: Pharmacist

## 2020-08-20 NOTE — Progress Notes (Signed)
  Chronic Care Management   Note  08/20/2020 Name: Stuart Harvey MRN: [id number] DOB: Jan 05, 1951    I spoke with [name] today to notify him Dr. Fletcher Anon has approved switch to Xarelto if that will save him money. [name] states he prefers to remain on Eliquis. Says his monthly cost was $130 when he went to pick up a monthly supply instead of >$500 he had originally been quoted at the pharmacy. This is acceptable to him.  I haved mailed PAP application for his wife's [name].  No further issues or concerns.   Junita Push. Kenton Kingfisher PharmD, [facility] [phone]

## 2020-09-10 ENCOUNTER — Other Ambulatory Visit: Payer: Self-pay

## 2020-09-10 DIAGNOSIS — R972 Elevated prostate specific antigen [PSA]: Secondary | ICD-10-CM

## 2020-09-11 ENCOUNTER — Other Ambulatory Visit: Payer: Self-pay

## 2020-09-11 ENCOUNTER — Other Ambulatory Visit: Payer: Medicare HMO

## 2020-09-11 DIAGNOSIS — R972 Elevated prostate specific antigen [PSA]: Secondary | ICD-10-CM

## 2020-09-11 NOTE — Progress Notes (Signed)
Open in Error  Raynelle [place]land, [place] Assistant [phone]

## 2020-09-12 LAB — PSA: Prostate Specific Ag, Serum: 1.3 ng/mL (ref 0.0–4.0)

## 2020-09-16 ENCOUNTER — Encounter: Payer: Self-pay | Admitting: Urology

## 2020-09-16 ENCOUNTER — Other Ambulatory Visit: Payer: Self-pay

## 2020-09-16 ENCOUNTER — Ambulatory Visit: Payer: Medicare HMO | Admitting: Urology

## 2020-09-16 VITALS — BP 116/64 | HR 42 | Ht 72.0 in | Wt 211.0 lb

## 2020-09-16 DIAGNOSIS — N401 Enlarged prostate with lower urinary tract symptoms: Secondary | ICD-10-CM

## 2020-09-16 DIAGNOSIS — N529 Male erectile dysfunction, unspecified: Secondary | ICD-10-CM

## 2020-09-16 DIAGNOSIS — N138 Other obstructive and reflux uropathy: Secondary | ICD-10-CM

## 2020-09-16 DIAGNOSIS — Z125 Encounter for screening for malignant neoplasm of prostate: Secondary | ICD-10-CM

## 2020-09-16 DIAGNOSIS — R972 Elevated prostate specific antigen [PSA]: Secondary | ICD-10-CM | POA: Diagnosis not present

## 2020-09-16 LAB — BLADDER SCAN AMB NON-IMAGING

## 2020-09-16 NOTE — Patient Instructions (Signed)
Overactive Bladder, Adult  Overactive bladder refers to a condition in which a person has a sudden need to pass urine. The person may leak urine if he or she cannot get to the bathroom fast enough (urinary incontinence). A person with this condition may also wake up several times in the night to go to the bathroom. Overactive bladder is associated with poor nerve signals between your bladder and your brain. Your bladder may get the signal to empty before it is full. You may also have very sensitive muscles that make your bladder squeeze too soon. These symptoms might interfere with daily work or social activities. What are the causes? This condition may be associated with or caused by:  Urinary tract infection.  Infection of nearby tissues, such as the prostate.  Prostate enlargement.  Surgery on the uterus or urethra.  Bladder stones, inflammation, or tumors.  Drinking too much caffeine or alcohol.  Certain medicines, especially medicines that get rid of extra fluid in the body (diuretics).  Muscle or nerve weakness, especially from: ? A spinal cord injury. ? Stroke. ? Multiple sclerosis. ? Parkinson's disease.  Diabetes.  Constipation. What increases the risk? You may be at greater risk for overactive bladder if you:  Are an older adult.  Smoke.  Are going through menopause.  Have prostate problems.  Have a neurological disease, such as stroke, dementia, Parkinson's disease, or multiple sclerosis (MS).  Eat or drink things that irritate the bladder. These include alcohol, spicy food, and caffeine.  Are overweight or obese. What are the signs or symptoms? Symptoms of this condition include:  Sudden, strong urge to urinate.  Leaking urine.  Urinating 8 or more times a day.  Waking up to urinate 2 or more times a night. How is this diagnosed? Your health care provider may suspect overactive bladder based on your symptoms. He or she will diagnose this condition  by:  A physical exam and medical history.  Blood or urine tests. You might need bladder or urine tests to help determine what is causing your overactive bladder. You might also need to see a health care provider who specializes in urinary tract problems (urologist). How is this treated? Treatment for overactive bladder depends on the cause of your condition and whether it is mild or severe. You can also make lifestyle changes at home. Options include:  Bladder training. This may include: ? Learning to control the urge to urinate by following a schedule that directs you to urinate at regular intervals (timed voiding). ? Doing Kegel exercises to strengthen your pelvic floor muscles, which support your bladder. Toning these muscles can help you control urination, even if your bladder muscles are overactive.  Special devices. This may include: ? Biofeedback, which uses sensors to help you become aware of your body's signals. ? Electrical stimulation, which uses electrodes placed inside the body (implanted) or outside the body. These electrodes send gentle pulses of electricity to strengthen the nerves or muscles that control the bladder. ? Women may use a plastic device that fits into the vagina and supports the bladder (pessary).  Medicines. ? Antibiotics to treat bladder infection. ? Antispasmodics to stop the bladder from releasing urine at the wrong time. ? Tricyclic antidepressants to relax bladder muscles. ? Injections of botulinum toxin type A directly into the bladder tissue to relax bladder muscles.  Lifestyle changes. This may include: ? Weight loss. Talk to your health care provider about weight loss methods that would work best for you. ?   Diet changes. This may include reducing how much alcohol and caffeine you consume, or drinking fluids at different times of the day. ? Not smoking. Do not use any products that contain nicotine or tobacco, such as cigarettes and e-cigarettes. If  you need help quitting, ask your health care provider.  Surgery. ? A device may be implanted to help manage the nerve signals that control urination. ? An electrode may be implanted to stimulate electrical signals in the bladder. ? A procedure may be done to change the shape of the bladder. This is done only in very severe cases. Follow these instructions at home: Lifestyle  Make any diet or lifestyle changes that are recommended by your health care provider. These may include: ? Drinking less fluid or drinking fluids at different times of the day. ? Cutting down on caffeine or alcohol. ? Doing Kegel exercises. ? Losing weight if needed. ? Eating a healthy and balanced diet to prevent constipation. This may include:  Eating foods that are high in fiber, such as fresh fruits and vegetables, whole grains, and beans.  Limiting foods that are high in fat and processed sugars, such as fried and sweet foods. General instructions  Take over-the-counter and prescription medicines only as told by your health care provider.  If you were prescribed an antibiotic medicine, take it as told by your health care provider. Do not stop taking the antibiotic even if you start to feel better.  Use any implants or pessary as told by your health care provider.  If needed, wear pads to absorb urine leakage.  Keep a journal or log to track how much and when you drink and when you feel the need to urinate. This will help your health care provider monitor your condition.  Keep all follow-up visits as told by your health care provider. This is important. Contact a health care provider if:  You have a fever.  Your symptoms do not get better with treatment.  Your pain and discomfort get worse.  You have more frequent urges to urinate. Get help right away if:  You are not able to control your bladder. Summary  Overactive bladder refers to a condition in which a person has a sudden need to pass  urine.  Several conditions may lead to an overactive bladder.  Treatment for overactive bladder depends on the cause and severity of your condition.  Follow your health care provider's instructions about lifestyle changes, doing Kegel exercises, keeping a journal, and taking medicines. This information is not intended to replace advice given to you by your health care provider. Make sure you discuss any questions you have with your health care provider. Document Revised: 02/21/2019 Document Reviewed: 11/16/2017 Elsevier Patient Education  [place]. Prostate Cancer Screening  Prostate cancer screening is a test that is done to check for the presence of prostate cancer in men. The prostate gland is a walnut-sized gland that is located below the bladder and in front of the rectum in males. The function of the prostate is to add fluid to semen during ejaculation. Prostate cancer is the second most common type of cancer in men. Who should have prostate cancer screening?  Screening recommendations vary based on age and other risk factors. Screening is recommended if:  You are older than age 61. If you are age 79-69, talk with your health care provider about your need for screening and how often screening should be done. Because most prostate cancers are slow growing and will  not cause death, screening is generally reserved in this age group for men who have a 10-15-year life expectancy.  You are younger than age 95, and you have these risk factors: ? Being a black male or a male of African descent. ? Having a father, brother, or uncle who has been diagnosed with prostate cancer. The risk is higher if your family member's cancer occurred at an early age. Screening is not recommended if:  You are younger than age 15.  You are between the ages of 35 and 65 and you have no risk factors.  You are 75 years of age or older. At this age, the risks that screening can cause are greater than the  benefits that it may provide. If you are at high risk for prostate cancer, your health care provider may recommend that you have screenings more often or that you start screening at a younger age. How is screening for prostate cancer done? The recommended prostate cancer screening test is a blood test called the prostate-specific antigen (PSA) test. PSA is a protein that is made in the prostate. As you age, your prostate naturally produces more PSA. Abnormally high PSA levels may be caused by:  Prostate cancer.  An enlarged prostate that is not caused by cancer (benign prostatic hyperplasia, BPH). This condition is very common in older men.  A prostate gland infection (prostatitis). Depending on the PSA results, you may need more tests, such as:  A physical exam to check the size of your prostate gland.  Blood and imaging tests.  A procedure to remove tissue samples from your prostate gland for testing (biopsy). What are the benefits of prostate cancer screening?  Screening can help to identify cancer at an early stage, before symptoms start and when the cancer can be treated more easily.  There is a small chance that screening may lower your risk of dying from prostate cancer. The chance is small because prostate cancer is a slow-growing cancer, and most men with prostate cancer die from a different cause. What are the risks of prostate cancer screening? The main risk of prostate cancer screening is diagnosing and treating prostate cancer that would never have caused any symptoms or problems. This is called overdiagnosisand overtreatment. PSA screening cannot tell you if your PSA is high due to cancer or a different cause. A prostate biopsy is the only procedure to diagnose prostate cancer. Even the results of a biopsy may not tell you if your cancer needs to be treated. Slow-growing prostate cancer may not need any treatment other than monitoring, so diagnosing and treating it may cause  unnecessary stress or other side effects. A prostate biopsy may also cause:  Infection or fever.  A false negative. This is a result that shows that you do not have prostate cancer when you actually do have prostate cancer. Questions to ask your health care provider  When should I start prostate cancer screening?  What is my risk for prostate cancer?  How often do I need screening?  What type of screening tests do I need?  How do I get my test results?  What do my results mean?  Do I need treatment? Where to find more information  The American Cancer Society: www.cancer.org  American Urological Association: www.auanet.org Contact a health care provider if:  You have difficulty urinating.  You have pain when you urinate or ejaculate.  You have blood in your urine or semen.  You have pain in your back  or in the area of your prostate. Summary  Prostate cancer is a common type of cancer in men. The prostate gland is located below the bladder and in front of the rectum. This gland adds fluid to semen during ejaculation.  Prostate cancer screening may identify cancer at an early stage, when the cancer can be treated more easily.  The prostate-specific antigen (PSA) test is the recommended screening test for prostate cancer.  Discuss the risks and benefits of prostate cancer screening with your health care provider. If you are age 61 or older, the risks that screening can cause are greater than the benefits that it may provide. This information is not intended to replace advice given to you by your health care provider. Make sure you discuss any questions you have with your health care provider. Document Revised: 06/13/2019 Document Reviewed: 06/13/2019 Elsevier Patient Education  [place].

## 2020-09-16 NOTE — Progress Notes (Signed)
   09/16/2020 1:49 PM   Stuart Harvey 11-29-1950 [id number]  Reason for visit: Follow up PSA screening, BPH, polycystic left kidney, ED  HPI: I saw [name] in urology clinic for the above issues. He is a very comorbid 69 year old male with a extensive history of vascular disease, and remains on Eliquis.  He has a known polycystic left kidney that has been followed for over 5 years with MRI and ultrasound, with no concerning features.  He is asymptomatic with no recurrent infections.   He denies any problems over the last year and has been doing well.  He actually stopped his Flomax and did not notice a significant difference in his urinary symptoms and remains off this medication.  His primary urinary complaints are urinary urgency and frequency, as well as nocturia 2 times per night.  IPSS score today is 5, with quality of life mostly satisfied.  He drinks coffee, tea, diet sodas that are the primary etiology of his urinary symptoms, and we reviewed behavioral strategies at length today.  Regarding his erectile dysfunction, he and his wife have decided that they no longer would like to be sexually active secondary to their medical issues, and he is not interested in medications for ED at this time.  Finally, PSA is stable this year at 1.3 from 1.7 prior.  We reviewed the AUA guidelines regarding PSA screening, and the recommendation against routine screening in men over age 41.  He is amenable to repeating the PSA next year, and considering discontinuing screening at that time.  RTC 1 year with PSA prior  Billey Co, MD  [facility] [address], [place], [place] [address] [phone]

## 2020-09-18 ENCOUNTER — Encounter: Payer: Self-pay | Admitting: Gastroenterology

## 2020-09-28 ENCOUNTER — Other Ambulatory Visit: Payer: Self-pay | Admitting: Urology

## 2020-09-28 DIAGNOSIS — R972 Elevated prostate specific antigen [PSA]: Secondary | ICD-10-CM

## 2020-10-03 ENCOUNTER — Other Ambulatory Visit: Payer: Self-pay | Admitting: Urology

## 2020-10-03 DIAGNOSIS — R972 Elevated prostate specific antigen [PSA]: Secondary | ICD-10-CM

## 2020-10-20 ENCOUNTER — Other Ambulatory Visit: Payer: Self-pay | Admitting: Nurse Practitioner

## 2020-10-20 NOTE — Telephone Encounter (Signed)
Requested Prescriptions  Pending Prescriptions Disp Refills  . buPROPion (WELLBUTRIN SR) 200 MG 12 hr tablet [Pharmacy Med Name: BUPROPION HCL SR 200 MG TABLET] 180 tablet 0    Sig: Take 1 tablet (200 mg total) by mouth 2 (two) times daily.     Psychiatry: Antidepressants - bupropion Passed - 10/20/2020  9:30 AM      Passed - Completed PHQ-2 or PHQ-9 in the last 360 days      Passed - Last BP in normal range    BP Readings from Last 1 Encounters:  09/16/20 116/64         Passed - Valid encounter within last 6 months    Recent Outpatient Visits          3 months ago Major depressive disorder with single episode, in partial remission ([place])   [place], [name], NP   9 months ago Abdominal aortic aneurysm (AAA) without rupture ([place])   [place], [name], NP   11 months ago Lab test positive for detection of COVID-19 virus   [employer], [place] T, NP   12 months ago Lab test positive for detection of COVID-19 virus   [facility] [place], [place] T, NP   1 year ago Hypertensive heart/kidney disease without HF and with CKD stage III ([place])   [place], Barbaraann Faster, NP      Future Appointments            In 1 month [place], Mertie Clause, MD [place]   In 2 months [place], Barbaraann Faster, NP [facility]   In 9 months  [facility]

## 2020-10-30 ENCOUNTER — Telehealth: Payer: Self-pay

## 2020-10-31 ENCOUNTER — Other Ambulatory Visit: Payer: Self-pay | Admitting: Nurse Practitioner

## 2020-10-31 NOTE — Telephone Encounter (Signed)
Requested Prescriptions  Pending Prescriptions Disp Refills  . omeprazole (PRILOSEC) 20 MG capsule [Pharmacy Med Name: OMEPRAZOLE DR 20 MG CAPSULE] 90 capsule 0    Sig: Take 1 capsule (20 mg total) by mouth daily.     Gastroenterology: Proton Pump Inhibitors Passed - 10/31/2020 10:42 AM      Passed - Valid encounter within last 12 months    Recent Outpatient Visits          4 months ago Major depressive disorder with single episode, in partial remission ([place])   [place], [name], NP   10 months ago Abdominal aortic aneurysm (AAA) without rupture ([place])   [place], [name], NP   11 months ago Lab test positive for detection of COVID-19 virus   [place], [place] T, NP   1 year ago Lab test positive for detection of COVID-19 virus   [facility] [place], [place] T, NP   1 year ago 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

## 2020-11-18 ENCOUNTER — Other Ambulatory Visit: Payer: Self-pay | Admitting: Cardiovascular Disease

## 2020-11-18 NOTE — Telephone Encounter (Signed)
Refill Request.  

## 2020-11-18 NOTE — Telephone Encounter (Signed)
Pt's age 70, wt 95.7 kg, SCr 1.49, CrCl 63.34, last ov w MA 06/09/20.

## 2020-12-15 ENCOUNTER — Ambulatory Visit: Payer: Medicare HMO | Admitting: Cardiovascular Disease

## 2020-12-15 ENCOUNTER — Other Ambulatory Visit: Payer: Self-pay

## 2020-12-15 ENCOUNTER — Encounter: Payer: Self-pay | Admitting: Cardiovascular Disease

## 2020-12-15 VITALS — BP 110/64 | HR 72 | Ht 72.0 in | Wt 215.0 lb

## 2020-12-15 DIAGNOSIS — E785 Hyperlipidemia, unspecified: Secondary | ICD-10-CM

## 2020-12-15 DIAGNOSIS — I2510 Atherosclerotic heart disease of native coronary artery without angina pectoris: Secondary | ICD-10-CM

## 2020-12-15 DIAGNOSIS — I714 Abdominal aortic aneurysm, without rupture, unspecified: Secondary | ICD-10-CM

## 2020-12-15 DIAGNOSIS — I10 Essential (primary) hypertension: Secondary | ICD-10-CM

## 2020-12-15 DIAGNOSIS — I480 Paroxysmal atrial fibrillation: Secondary | ICD-10-CM | POA: Diagnosis not present

## 2020-12-15 NOTE — Patient Instructions (Signed)
Medication Instructions:  Your physician recommends that you continue on your current medications as directed. Please refer to the Current Medication list given to you today.  *If you need a refill on your cardiac medications before your next appointment, please call your pharmacy*   Lab Work: None ordered If you have labs (blood work) drawn today and your tests are completely normal, you will receive your results only by: Marland Kitchen MyChart Message (if you have MyChart) OR . A paper copy in the mail If you have any lab test that is abnormal or we need to change your treatment, we will call you to review the results.   Testing/Procedures: None ordered   Follow-Up: At [facility], you and your health needs are our priority.  As part of our continuing mission to provide you with exceptional heart care, we have created designated Provider Care Teams.  These Care Teams include your primary Cardiologist (physician) and Advanced Practice Providers (APPs -  Physician Assistants and Nurse Practitioners) who all work together to provide you with the care you need, when you need it.  We recommend signing up for the patient portal called "MyChart".  Sign up information is provided on this After Visit Summary.  MyChart is used to connect with patients for Virtual Visits (Telemedicine).  Patients are able to view lab/test results, encounter notes, upcoming appointments, etc.  Non-urgent messages can be sent to your provider as well.   To learn more about what you can do with MyChart, go to [email or web address].    Your next appointment:   Your physician wants you to follow-up in: 9 months You will receive a reminder letter in the mail two months in advance. If you don't receive a letter, please call our office to schedule the follow-up appointment.  The format for your next appointment:   In Person  Provider:   You may see [name], MD or one of the following Advanced Practice Providers on your  designated Care Team:    Murray Hodgkins, NP  Christell Faith, PA-C  Marrianne Mood, PA-C  Cadence [place], [place]  Laurann [place], NP    Other Instructions N/A

## 2020-12-15 NOTE — Progress Notes (Signed)
Cardiology Office Note   Date:  12/15/2020   ID:  XAI FRERKING, DOB March 01, 1951, MRN [id number]  PCP:  Margo Common, PA-C  Cardiologist:   [name], MD   Chief Complaint  Patient presents with  . 6 month follow up.     "doing well." Medications reviewed by the patient verbally.       History of Present Illness: Stuart Harvey is a 70 y.o. male who presents fora followup visit regarding coronary artery disease.  He has known history of severe three-vessel coronary artery disease diagnosed in 2008 as well as a large infrarenal abdominal aortic aneurysm. He underwent coronary artery bypass graft surgery in August 2008 at [facility]. He underwent open surgical repair of aortic aneurysm in October of 2008.    Aortic aneurysm followup is done  by Dr. Lucky Cowboy.  He was hospitalized in July of 2019 with unresponsiveness.  He reportedly was down for about 1 hour with possible seizure-like activities.  He had numerous contusions.  Per EMS he was in ventricular tachycardia upon arrival and was cardioverted.  He was briefly intubated and was found to have mildly elevated troponin.  CPK was close to 4000.  Echo showed an EF of 50 to 55%.  He underwent cardiac catheterization which showed severe underlying three-vessel coronary artery disease with patent grafts including LIMA to LAD, SVG to RCA, SVG to diagonal and SVG to OM 3.  There was borderline disease in the SVG to OM but that was left to be treated medically given that it was supplying a small area.  The patient did develop atrial fibrillation while hospitalized.  He converted spontaneously.  His presentation was felt to be possibly triggered by heat stroke.  He had COVID-19 infection in November 2020 and was treated with monoclonal antibody infusion.   During last visit, I discontinued metoprolol due to low blood pressure but then he started having high blood pressure readings and will resume the medication. He has been doing well with no  recent chest pain, shortness of breath or palpitations.  No lower extremity claudication.  No side effects with anticoagulation although Eliquis tends to be Eliquis when he is in the donut hole.  Past Medical History:  Diagnosis Date  . AAA (abdominal aortic aneurysm) ([place])    s/p surgical repair in 08/2007 by Dr. Hulda Humphrey  . Anemia   . Arthritis    both knees  . CKD (chronic kidney disease), stage II   . Coronary artery disease    a. 4-vCABG @ [place] 06/2007 (LIMA-LAD, VG-RPDA, VG-OM3,VG-D2; b. LHC 2011: patent grafts EF 40%, c. LHC 06/05/18: LM nl, pLAD 50%, p-mLAD 50%, ostD1 70%, ost ramus 30%, ost-pLCx 40%, pLCx 100% (chronically occluded), OM3 100%, p-dRCA 100% (chronically occluded), LIMA-LAD patent, VG-RPDA patent, VG-OM3 mid-graft 60% and 80%  . Depression   . GERD (gastroesophageal reflux disease)   . Hernia of abdominal cavity   . Hyperlipidemia   . Hypertension   . Hypothyroidism     Past Surgical History:  Procedure Laterality Date  . ABDOMINAL AORTIC ANEURYSM REPAIR  08/2007  . ABDOMINAL AORTIC ANEURYSM REPAIR  11/2012   [facility]  . CARDIAC CATHETERIZATION  2008, 2011   [facility]   . CORONARY ARTERY BYPASS GRAFT  2008  . CORONARY/GRAFT ANGIOGRAPHY N/A 06/05/2018   Procedure: CORONARY/GRAFT ANGIOGRAPHY;  Surgeon: Nelva Bush, MD;  Location: [facility] [facility];  Service: Cardiovascular;  Laterality: N/A;  . EXPLORATORY LAPAROTOMY  1997  . HERNIA REPAIR    .  JOINT REPLACEMENT     right knee  . KNEE ARTHROPLASTY Left 09/19/2016   Procedure: COMPUTER ASSISTED TOTAL KNEE ARTHROPLASTY;  Surgeon: Dereck Leep, MD;  Location: [facility] ORS;  Service: Orthopedics;  Laterality: Left;  . TOTAL KNEE ARTHROPLASTY Right 09/2016     Current Outpatient Medications  Medication Sig Dispense Refill  . buPROPion (WELLBUTRIN SR) 200 MG 12 hr tablet Take 1 tablet (200 mg total) by mouth 2 (two) times daily. 180 tablet 0  . Cholecalciferol 50 MCG (2000 UT) CAPS Take 1 capsule by mouth daily.     .  citalopram (CELEXA) 20 MG tablet Take 1 tablet (20 mg total) by mouth daily. 90 tablet 4  . ELIQUIS 5 MG TABS tablet Take 1 tablet (5 mg total) by mouth 2 (two) times daily. 180 tablet 1  . levothyroxine (SYNTHROID) 50 MCG tablet Take 1 tablet (50 mcg total) by mouth daily. 90 tablet 3  . metoprolol tartrate (LOPRESSOR) 25 MG tablet Take 0.5 tablets (12.5 mg total) by mouth 2 (two) times daily. 90 tablet 2  . omeprazole (PRILOSEC) 20 MG capsule Take 1 capsule (20 mg total) by mouth daily. 90 capsule 0  . rosuvastatin (CRESTOR) 40 MG tablet Take 1 tablet (40 mg total) by mouth daily. 90 tablet 0   No current facility-administered medications for this visit.    Allergies:   Benzodiazepines, Codeine, and Tetracycline    Social History:  The patient  reports that he quit smoking about 15 years ago. His smoking use included cigarettes. He has a 35.00 pack-year smoking history. His smokeless tobacco use includes chew. He reports that he does not drink alcohol and does not use drugs.   Family History:  The patient's family history includes Depression in his maternal grandfather and maternal grandmother; Heart attack in his father, mother, and another family member; Heart disease in his mother and another family member; Hypertension in his father and mother; Kidney disease in his brother.    ROS:  Please see the history of present illness.   Otherwise, review of systems are positive for none.   All other systems are reviewed and negative.    PHYSICAL EXAM: VS:  BP 110/64 (BP Location: Left Arm, Patient Position: Sitting, Cuff Size: Normal)   Pulse 72   Ht 6' (1.829 m)   Wt 215 lb (97.5 kg)   SpO2 98%   BMI 29.16 kg/m  , BMI Body mass index is 29.16 kg/m. GEN: Well nourished, well developed, in no acute distress  HEENT: normal  Neck: no JVD, carotid bruits, or masses Cardiac: RRR; no murmurs, rubs, or gallops,no edema  Respiratory:  clear to auscultation bilaterally, normal work of  breathing GI: soft, nontender, nondistended, + BS MS: no deformity or atrophy  Skin: warm and dry, no rash Neuro:  Strength and sensation are intact Psych: euthymic mood, full affect   EKG:  EKG is ordered today. The ekg ordered today demonstrates sinus rhythm with PACs, left anterior fascicular block and nonspecific T wave changes.  Recent Labs: 12/27/2019: ALT 13; TSH 3.140 06/09/2020: BUN 35; Creatinine, Ser 1.49; Hemoglobin 14.6; Platelets 156; Potassium 4.3; Sodium 141    Lipid Panel    Component Value Date/Time   CHOL 143 07/03/2020 1357   CHOL 159 12/18/2015 0827   TRIG 97 07/03/2020 1357   TRIG 166 (H) 12/18/2015 0827   HDL 48 07/03/2020 1357   CHOLHDL 3.4 09/25/2018 1208   VLDL 31 09/25/2018 1208   VLDL 33 (H) 12/18/2015  0827   LDLCALC 77 07/03/2020 1357      Wt Readings from Last 3 Encounters:  12/15/20 215 lb (97.5 kg)  09/16/20 211 lb (95.7 kg)  08/03/20 210 lb (95.3 kg)       ASSESSMENT AND PLAN:  1.  Coronary artery disease involving native coronary arteries without angina: He is doing well overall with no anginal symptoms.  Continue medical therapy  2.  Paroxysmal atrial fibrillation: Currently in sinus rhythm.  Continue small dose metoprolol and anticoagulation with Eliquis.  3.  Essential hypertension: Blood pressure is controlled on small dose metoprolol.  4. Hyperlipidemia: Continue treatment with high-dose rosuvastatin.  Most recent LDL was 62.  5. Abdominal aortic aneurysm: Status post surgical repair. This is followed annually by Dr. Lucky Cowboy.     Disposition:   FU with me in 9 months  Signed,  [name], MD  12/15/2020 4:04 PM    [place]

## 2021-01-04 ENCOUNTER — Other Ambulatory Visit: Payer: Self-pay | Admitting: Nurse Practitioner

## 2021-01-05 ENCOUNTER — Other Ambulatory Visit: Payer: Self-pay

## 2021-01-05 ENCOUNTER — Encounter: Payer: Self-pay | Admitting: Nurse Practitioner

## 2021-01-05 ENCOUNTER — Ambulatory Visit (INDEPENDENT_AMBULATORY_CARE_PROVIDER_SITE_OTHER): Payer: Medicare HMO | Admitting: Nurse Practitioner

## 2021-01-05 VITALS — BP 120/76 | HR 64 | Temp 98.2°F | Wt 214.6 lb

## 2021-01-05 DIAGNOSIS — R351 Nocturia: Secondary | ICD-10-CM

## 2021-01-05 DIAGNOSIS — I714 Abdominal aortic aneurysm, without rupture, unspecified: Secondary | ICD-10-CM

## 2021-01-05 DIAGNOSIS — I480 Paroxysmal atrial fibrillation: Secondary | ICD-10-CM

## 2021-01-05 DIAGNOSIS — N401 Enlarged prostate with lower urinary tract symptoms: Secondary | ICD-10-CM

## 2021-01-05 DIAGNOSIS — R7301 Impaired fasting glucose: Secondary | ICD-10-CM

## 2021-01-05 DIAGNOSIS — E782 Mixed hyperlipidemia: Secondary | ICD-10-CM

## 2021-01-05 DIAGNOSIS — I1310 Hypertensive heart and chronic kidney disease without heart failure, with stage 1 through stage 4 chronic kidney disease, or unspecified chronic kidney disease: Secondary | ICD-10-CM | POA: Diagnosis not present

## 2021-01-05 DIAGNOSIS — F324 Major depressive disorder, single episode, in partial remission: Secondary | ICD-10-CM

## 2021-01-05 DIAGNOSIS — N400 Enlarged prostate without lower urinary tract symptoms: Secondary | ICD-10-CM | POA: Insufficient documentation

## 2021-01-05 DIAGNOSIS — N1831 Chronic kidney disease, stage 3a: Secondary | ICD-10-CM

## 2021-01-05 DIAGNOSIS — E039 Hypothyroidism, unspecified: Secondary | ICD-10-CM

## 2021-01-05 DIAGNOSIS — D692 Other nonthrombocytopenic purpura: Secondary | ICD-10-CM

## 2021-01-05 NOTE — Assessment & Plan Note (Signed)
Chronic, stable with BP at goal today for age.  Continue collaboration with cardiology, recent note reviewed.  Recent CMP today.  Recommend he check BP at home at least a few mornings a week.  Focus on DASH diet.  Return in 6 months.

## 2021-01-05 NOTE — Assessment & Plan Note (Signed)
Followed by urology, no current medications.  Working on diet changes, including reduction of coffee which he drinks all day.  Continue to collaborate with urology. 

## 2021-01-05 NOTE — Assessment & Plan Note (Signed)
Chronic, ongoing.  Continue  collaboration with cardiology + Eliquis, recent note reviewed + labs.  CBC next visit, annually.  CCM referral last visit, unable to assist with costs at this time.  Return in 6 months.

## 2021-01-05 NOTE — Assessment & Plan Note (Signed)
Bilateral upper extremity, takes Eliquis daily.   Continue to monitor for skin breakdown and encourage use of lotion daily to bilateral upper extremities. 

## 2021-01-05 NOTE — Assessment & Plan Note (Signed)
Recheck A1c today, continue diet focus. 

## 2021-01-05 NOTE — Assessment & Plan Note (Signed)
Chronic, stable.  Continue current medication regimen.  Lipid panel today.  Will consider addition of Zetia as recommended by cardiology if LDL >70.  Discussed with patient.

## 2021-01-05 NOTE — Progress Notes (Signed)
BP 120/76   Pulse 64   Temp 98.2 F (36.8 C) (Oral)   Wt 214 lb 9.6 oz (97.3 kg)   SpO2 97%   BMI 29.10 kg/m    Subjective:    Patient ID: Stuart Harvey, male    DOB: [birth year], 70 y.o.   MRN: [id number]  HPI: Stuart Harvey is a 70 y.o. male  Chief Complaint  Patient presents with  . Follow-up   HYPERTENSION / HYPERLIPIDEMIA/CKD 3 Continues on Crestor 40 MG.  Last saw cardiology 12/15/20, Metoprolol continued at small dose, recommend Zetia if his LDL remains > 70 recent was 77.  Last saw vascular in August 2021 for his AAA, had stent in 2014.   Last Echo 2019 - 50-55%. Satisfied with current treatment? yes Duration of hypertension: chronic BP monitoring frequency: not checking BP range:  BP medication side effects: no Duration of hyperlipidemia: chronic Cholesterol medication side effects: no Cholesterol supplements: none Medication compliance: good compliance Aspirin: no Recent stressors: no Recurrent headaches: no Visual changes: no Palpitations: no Dyspnea: occasional if over exerts -- no orthopnea Chest pain: no Lower extremity edema: no Dizzy/lightheaded: no   ATRIAL FIBRILLATION Followed by cardiology.  Taking Eliquis -- is paying $400 when gets into donut hole.  Does report concerns with cost, especially when in donut hole.   Atrial fibrillation status: stable Satisfied with current treatment: yes  Medication side effects:  no Medication compliance: good compliance Etiology of atrial fibrillation:  Palpitations:  no Chest pain:  no Dyspnea on exertion:  no Orthopnea:  no Syncope:  no Edema:  no Ventricular rate control: nothing Anti-coagulation: long acting  IFG: Last A1C in February 2021 was 5.8%. Polydipsia/polyuria: no Visual disturbance: no Chest pain: no Paresthesias: no   HYPOTHYROIDISM Last TSH 3.140 and T4 6.7 in February 2021, continues on Levothyroxine 50 MCG. Thyroid control status:stable Satisfied with current treatment?  yes Medication side effects: no Medication compliance: good compliance Etiology of hypothyroidism:  Recent dose adjustment:no Fatigue: no Cold intolerance: no Heat intolerance: no Weight gain: no Weight loss: no Constipation: no Diarrhea/loose stools: no Palpitations: no Lower extremity edema: no Anxiety/depressed mood: no   CHRONIC KIDNEY DISEASE Labs in July 2021 -- CRT 1.49 and GFR 48 CKD status: stable Medications renally dose: yes Previous renal evaluation: no Pneumovax:  Up to Date Influenza Vaccine:  Up to Date   BPH Last saw urology last 09/16/20 -- does endorse frequent urination at night -- part of issue is he drinks a lot coffee.   BPH status: uncontrolled Duration: chronic Nocturia: 2-3x per night Urinary frequency:yes Incomplete voiding: no Urgency: yes Weak urinary stream: no Straining to start stream: no Dysuria: no Onset: gradual Severity: moderate Alleviating factors:  Aggravating factors:  Treatments attempted:  IPSS Questionnaire (AUA-7): Over the past month.   1)  How often have you had a sensation of not emptying your bladder completely after you finish urinating?  2 - Less than half the time  2)  How often have you had to urinate again less than two hours after you finished urinating? 2 - Less than half the time  3)  How often have you found you stopped and started again several times when you urinated?  2 - Less than half the time  4) How difficult have you found it to postpone urination?  1 - Less than 1 time in 5  5) How often have you had a weak urinary stream?  0 - Not at all  6) How often have you had to push or strain to begin urination?  0 - Not at all  7) How many times did you most typically get up to urinate from the time you went to bed until the time you got up in the morning?  3 - 3 times  Total score:  0-7 mildly symptomatic   8-19 moderately symptomatic   20-35 severely symptomatic    DEPRESSION Continues on Wellbutrin 200  MG BID and Celexa 20 MG daily.   Mood status: stable Satisfied with current treatment?: no Symptom severity: mild  Duration of current treatment : chronic Side effects: no Medication compliance: good compliance Psychotherapy/counseling: none Depressed mood: no Anxious mood: no Anhedonia: no Significant weight loss or gain: no Insomnia: occasionally hard to fall asleep Fatigue: no Feelings of worthlessness or guilt: no Impaired concentration/indecisiveness: no Suicidal ideations: no Hopelessness: no Crying spells: no Depression screen [facility] 2/9 01/05/2021 08/03/2020 07/03/2020 12/27/2019 07/15/2019  Decreased Interest 0 0 0 0 -  Down, Depressed, Hopeless 0 0 0 0 0  PHQ - 2 Score 0 0 0 0 0  Altered sleeping 0 0 1 0 -  Tired, decreased energy 1 0 1 0 -  Change in appetite 0 0 0 0 -  Feeling bad or failure about yourself  0 0 0 0 -  Trouble concentrating 0 0 0 0 -  Moving slowly or fidgety/restless 2 0 0 0 -  Suicidal thoughts 0 0 0 0 -  PHQ-9 Score 3 0 2 0 -  Difficult doing work/chores - - Not difficult at all Not difficult at all -  Some recent data might be hidden   Relevant past medical, surgical, family and social history reviewed and updated as indicated. Interim medical history since our last visit reviewed. Allergies and medications reviewed and updated.  Review of Systems  Constitutional: Negative for activity change, diaphoresis, fatigue and fever.  Respiratory: Negative for cough, chest tightness, shortness of breath and wheezing.   Cardiovascular: Negative for chest pain, palpitations and leg swelling.  Gastrointestinal: Negative.   Endocrine: Negative for cold intolerance, heat intolerance, polydipsia, polyphagia and polyuria.  Neurological: Negative.   Psychiatric/Behavioral: Negative.     Per HPI unless specifically indicated above     Objective:    BP 120/76   Pulse 64   Temp 98.2 F (36.8 C) (Oral)   Wt 214 lb 9.6 oz (97.3 kg)   SpO2 97%   BMI 29.10  kg/m   Wt Readings from Last 3 Encounters:  01/05/21 214 lb 9.6 oz (97.3 kg)  12/15/20 215 lb (97.5 kg)  09/16/20 211 lb (95.7 kg)    Physical Exam Vitals and nursing note reviewed.  Constitutional:      General: He is awake. He is not in acute distress.    Appearance: He is well-developed. He is not ill-appearing.  HENT:     Head: Normocephalic and atraumatic.     Right Ear: Hearing normal. No drainage.     Left Ear: Hearing normal. No drainage.     Mouth/Throat:     Pharynx: Uvula midline.  Eyes:     General: Lids are normal.        Right eye: No discharge.        Left eye: No discharge.     Conjunctiva/sclera: Conjunctivae normal.     Pupils: Pupils are equal, round, and reactive to light.  Neck:     Thyroid: No thyromegaly.     Vascular: No  carotid bruit or JVD.     Trachea: Trachea normal.  Cardiovascular:     Rate and Rhythm: Normal rate and regular rhythm.     Heart sounds: Normal heart sounds, S1 normal and S2 normal. No murmur heard. No gallop.   Pulmonary:     Effort: Pulmonary effort is normal.     Breath sounds: Normal breath sounds.  Abdominal:     General: Bowel sounds are normal.     Palpations: Abdomen is soft. There is no hepatomegaly or splenomegaly.     Tenderness: There is no abdominal tenderness.  Musculoskeletal:        General: Normal range of motion.     Cervical back: Normal range of motion and neck supple.     Right lower leg: No edema.     Left lower leg: No edema.  Skin:    General: Skin is warm and dry.     Capillary Refill: Capillary refill takes less than 2 seconds.     Findings: No rash.     Comments: Scattered areas of pale purple bruises to bilateral upper extremities.  Intact skin.  Neurological:     Mental Status: He is alert and oriented to person, place, and time.     Deep Tendon Reflexes: Reflexes are normal and symmetric.  Psychiatric:        Mood and Affect: Mood normal.        Behavior: Behavior normal. Behavior is  cooperative.        Thought Content: Thought content normal.        Judgment: Judgment normal.     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  Continue collaboration with cardiology, recent note reviewed.  Recent CMP today.  Recommend he check BP at home at least a few mornings a week.  Focus on DASH diet.  Return in 6 months.      Relevant Orders   Comprehensive metabolic panel   AAA (abdominal aortic aneurysm) ([place])    Followed by cardiology and vascular.  History of stent in 2014.  Continue to collaborate with team members.      Atrial fibrillation (HCC)    Chronic, ongoing.  Continue  collaboration with cardiology + Eliquis, recent note reviewed + labs.  CBC next visit, annually.  CCM referral last visit, unable to assist with costs at this time.  Return in 6 months.      Senile purpura (HCC)    Bilateral upper extremity, takes Eliquis daily.   Continue to monitor for skin breakdown and encourage use of lotion daily to bilateral upper extremities.        Endocrine   IFG (impaired fasting glucose)    Recheck A1c today, continue diet focus.      Relevant Orders   HgB A1c   Hypothyroid    Chronic, ongoing.  Continue current medication regimen and adjust as needed.  TSH and Free T4 today.      Relevant Orders   TSH   T4, free     Genitourinary   CKD (chronic kidney disease) stage 3, GFR  30-59 ml/min (HCC)    Chronic, ongoing with last GFR 48.  Recheck labs today.  Continue current medication regimen and adjust as needed.  Could consider addition of low dose ACE or ARB for kidney protection in future -- would discuss with cardiology 1st.  CMP  today.      BPH (benign prostatic hyperplasia)    Followed by urology, no current medications.  Working on diet changes, including reduction of coffee which he drinks all day.  Continue to collaborate with urology.        Other   Hyperlipidemia    Chronic, stable.  Continue current medication regimen.  Lipid panel today.  Will consider addition of Zetia as recommended by cardiology if LDL >70.  Discussed with patient.      Relevant Orders   Lipid Panel w/o Chol/HDL Ratio   Depression    Chronic, stable with PHQ 9 = 3.  Continue Celexa 20 MG due to age >61 and risks with 40 MG dosing and continue Wellbutrin.  Denies SI/HI.  Return in 6 months.          Follow up plan: Return in about 6 months (around 07/05/2021) for HTN/HLD, THYROID, CKD, IFG, AAA, MOOD.

## 2021-01-05 NOTE — Assessment & Plan Note (Signed)
Chronic, stable with PHQ 9 = 3.  Continue Celexa 20 MG due to age >13 and risks with 40 MG dosing and continue Wellbutrin.  Denies SI/HI.  Return in 6 months.

## 2021-01-05 NOTE — Assessment & Plan Note (Signed)
Followed by cardiology and vascular.  History of stent in 2014.  Continue to collaborate with team members. 

## 2021-01-05 NOTE — Assessment & Plan Note (Addendum)
Chronic, ongoing with last GFR 48.  Recheck labs today.  Continue current medication regimen and adjust as needed.  Could consider addition of low dose ACE or ARB for kidney protection in future -- would discuss with cardiology 1st.  CMP today.

## 2021-01-05 NOTE — Assessment & Plan Note (Signed)
Chronic, ongoing.  Continue current medication regimen and adjust as needed.  TSH and Free T4 today. 

## 2021-01-05 NOTE — Patient Instructions (Signed)

## 2021-01-06 ENCOUNTER — Other Ambulatory Visit: Payer: Self-pay | Admitting: Nurse Practitioner

## 2021-01-06 DIAGNOSIS — E039 Hypothyroidism, unspecified: Secondary | ICD-10-CM

## 2021-01-06 DIAGNOSIS — E782 Mixed hyperlipidemia: Secondary | ICD-10-CM

## 2021-01-06 LAB — COMPREHENSIVE METABOLIC PANEL
ALT: 15 IU/L (ref 0–44)
AST: 30 IU/L (ref 0–40)
Albumin/Globulin Ratio: 1.6 (ref 1.2–2.2)
Albumin: 4.3 g/dL (ref 3.8–4.8)
Alkaline Phosphatase: 117 IU/L (ref 44–121)
BUN/Creatinine Ratio: 19 (ref 10–24)
BUN: 25 mg/dL (ref 8–27)
Bilirubin Total: 0.6 mg/dL (ref 0.0–1.2)
CO2: 18 mmol/L — ABNORMAL LOW (ref 20–29)
Calcium: 9.5 mg/dL (ref 8.6–10.2)
Chloride: 105 mmol/L (ref 96–106)
Creatinine, Ser: 1.29 mg/dL — ABNORMAL HIGH (ref 0.76–1.27)
GFR calc Af Amer: 65 mL/min/{1.73_m2} (ref >59)
GFR calc non Af Amer: 56 mL/min/{1.73_m2} — ABNORMAL LOW (ref >59)
Globulin, Total: 2.7 g/dL (ref 1.5–4.5)
Glucose: 96 mg/dL (ref 65–99)
Potassium: 4.6 mmol/L (ref 3.5–5.2)
Sodium: 139 mmol/L (ref 134–144)
Total Protein: 7 g/dL (ref 6.0–8.5)

## 2021-01-06 LAB — T4, FREE: Free T4: 1.14 ng/dL (ref 0.82–1.77)

## 2021-01-06 LAB — HEMOGLOBIN A1C
Est. average glucose Bld gHb Est-mCnc: 117 mg/dL
Hgb A1c MFr Bld: 5.7 % — ABNORMAL HIGH (ref 4.8–5.6)

## 2021-01-06 LAB — LIPID PANEL W/O CHOL/HDL RATIO
Cholesterol, Total: 171 mg/dL (ref 100–199)
HDL: 52 mg/dL (ref >39)
LDL Chol Calc (NIH): 104 mg/dL — ABNORMAL HIGH (ref 0–99)
Triglycerides: 83 mg/dL (ref 0–149)
VLDL Cholesterol Cal: 15 mg/dL (ref 5–40)

## 2021-01-06 LAB — TSH: TSH: 5.92 u[IU]/mL — ABNORMAL HIGH (ref 0.450–4.500)

## 2021-01-06 NOTE — Progress Notes (Signed)
Please let Stuart Harvey know his labs have returned: - Kidney function continues to show some mild kidney disease, but no worsening.  We will continue to monitor this at visits.  Ensure to drink plenty of water at home. - TSH was slightly elevated again this visit, but Free T4 is normal -- I would like to recheck this in 6 weeks via outpatient lab visit only.  Please ensure you are taking Levothyroxine every morning 30 minutes before food or other medications.  Please schedule lab visit. - Your cholesterol levels are showing elevation in LDL above goal, it is 104, would like to see <70.  Would benefit from adding on Zetia as cardiology recommended.  If you would like me to send this in let me know.   - A1c remains stable in prediabetic range, no increase, at 5.7%.  Continue diet focus.  Any questions? Keep being awesome!!  Thank you for allowing me to participate in your care. Kindest regards, Jett Fukuda

## 2021-01-07 ENCOUNTER — Other Ambulatory Visit: Payer: Self-pay | Admitting: Nurse Practitioner

## 2021-01-07 MED ORDER — EZETIMIBE 10 MG PO TABS: 10.0000 mg | ORAL_TABLET | Freq: Every day | ORAL | 3 refills | Status: DC

## 2021-01-20 ENCOUNTER — Other Ambulatory Visit: Payer: Self-pay | Admitting: Nurse Practitioner

## 2021-02-05 ENCOUNTER — Other Ambulatory Visit: Payer: Self-pay | Admitting: Cardiovascular Disease

## 2021-02-05 ENCOUNTER — Other Ambulatory Visit: Payer: Self-pay | Admitting: Nurse Practitioner

## 2021-02-18 ENCOUNTER — Other Ambulatory Visit: Payer: Medicare HMO

## 2021-02-18 ENCOUNTER — Other Ambulatory Visit: Payer: Self-pay

## 2021-02-18 DIAGNOSIS — E039 Hypothyroidism, unspecified: Secondary | ICD-10-CM

## 2021-02-18 DIAGNOSIS — E782 Mixed hyperlipidemia: Secondary | ICD-10-CM

## 2021-02-19 ENCOUNTER — Other Ambulatory Visit: Payer: Self-pay | Admitting: Nurse Practitioner

## 2021-02-19 LAB — TSH: TSH: 6.28 u[IU]/mL — ABNORMAL HIGH (ref 0.450–4.500)

## 2021-02-19 LAB — LIPID PANEL W/O CHOL/HDL RATIO
Cholesterol, Total: 127 mg/dL (ref 100–199)
HDL: 45 mg/dL (ref >39)
LDL Chol Calc (NIH): 58 mg/dL (ref 0–99)
Triglycerides: 138 mg/dL (ref 0–149)
VLDL Cholesterol Cal: 24 mg/dL (ref 5–40)

## 2021-02-19 LAB — T4, FREE: Free T4: 1.12 ng/dL (ref 0.82–1.77)

## 2021-02-19 MED ORDER — LEVOTHYROXINE SODIUM 75 MCG PO TABS: 75.0000 ug | ORAL_TABLET | Freq: Every day | ORAL | 3 refills | Status: DC

## 2021-02-19 NOTE — Progress Notes (Signed)
Levothyroxine dose change. °

## 2021-02-19 NOTE — Progress Notes (Signed)
Good morning, please let Stuart Harvey know his labs have returned.  Cholesterol levels are drastically improved with Zetia on board with LDL going from 104 to now below stroke prevention goal at 58.  Continue statin and Zetia daily.  Thyroid levels, TSH remains elevated and has trended up, Free T4 remains normal.  At this time I would like to increase your Levothyroxine to 75 MCG daily and ensure you take this first thing in morning 30 minutes before eating and other medications.  I would like to see you for appointment back in office in 6 weeks for visit and to reassess this.  I have sent script in.  Stop Levothyroxine 50 MCG.  Any questions? Keep being awesome!!  Thank you for allowing me to participate in your care. Kindest regards, Allie Ousley

## 2021-03-19 ENCOUNTER — Other Ambulatory Visit: Payer: Self-pay | Admitting: Nurse Practitioner

## 2021-04-23 ENCOUNTER — Telehealth: Payer: Medicare HMO | Admitting: Pharmacist

## 2021-04-23 NOTE — Chronic Care Management (AMB) (Signed)
    Chronic Care Management Pharmacy Assistant   Name: Stuart Harvey  MRN: [id number] DOB: 03/04/1951   Reason for Encounter: Disease State General Adherence     Recent office visits:  01/05/21-Jolene Cannady, NP. General follow up. Labs ordered. Follow up in 6 months.  Recent consult visits:  12/15/20-Muhammad Fletcher Anon, MD (Cardiology) 6 month follow up visit. Follow up in 9 months.  Hospital visits:  None in previous 6 months  Medications: Outpatient Encounter Medications as of 04/23/2021  Medication Sig   ezetimibe (ZETIA) 10 MG tablet Take 1 tablet (10 mg total) by mouth daily.   buPROPion (WELLBUTRIN SR) 200 MG 12 hr tablet Take 1 tablet (200 mg total) by mouth 2 (two) times daily.   Cholecalciferol 50 MCG (2000 UT) CAPS Take 1 capsule by mouth daily.    citalopram (CELEXA) 20 MG tablet Take 1 tablet (20 mg total) by mouth daily.   ELIQUIS 5 MG TABS tablet Take 1 tablet (5 mg total) by mouth 2 (two) times daily.   levothyroxine (SYNTHROID) 75 MCG tablet Take 1 tablet (75 mcg total) by mouth daily.   metoprolol tartrate (LOPRESSOR) 25 MG tablet Take 0.5 tablets (12.5 mg total) by mouth 2 (two) times daily.   omeprazole (PRILOSEC) 20 MG capsule Take 1 capsule (20 mg total) by mouth daily.   rosuvastatin (CRESTOR) 40 MG tablet Take 1 tablet (40 mg total) by mouth daily.   No facility-administered encounter medications on file as of 04/23/2021.   Have you had any problems recently with your health? Patient states there is no problems with his health recently.  Have you had any problems with your pharmacy? Patient states he has not had any problems with pharmacy.  What issues or side effects are you having with your medications? Patient states he has no issues or side effect with his medications.  What would you like me to pass along to [facility] for them to help you with?  Patient states there is nothing at this time.  What can we do to take care of you  better? Patient states there is nothing at this time.   Star Rating Drugs: Rosuvastatin 40 mg Last filled:02/08/21 90 DS   Myriam Elta [place], [place]

## 2021-05-03 ENCOUNTER — Other Ambulatory Visit: Payer: Self-pay | Admitting: Nurse Practitioner

## 2021-05-08 ENCOUNTER — Other Ambulatory Visit: Payer: Self-pay | Admitting: Nurse Practitioner

## 2021-05-08 NOTE — Telephone Encounter (Signed)
Last RF 05/03/21 #[id number]

## 2021-05-19 ENCOUNTER — Other Ambulatory Visit: Payer: Self-pay | Admitting: Cardiovascular Disease

## 2021-05-19 ENCOUNTER — Telehealth: Payer: Self-pay | Admitting: Cardiovascular Disease

## 2021-05-19 DIAGNOSIS — I480 Paroxysmal atrial fibrillation: Secondary | ICD-10-CM

## 2021-05-19 NOTE — Telephone Encounter (Signed)
Patient calling  Wants a medication clarification for Eliquis  Please call to discuss

## 2021-05-19 NOTE — Telephone Encounter (Signed)
Patient made aware of [place], PharmD response and recommendation. Patient sts that he does not want to apply for patient assistance at this time. He voiced appreciation for the call and the assistance.

## 2021-05-19 NOTE — Telephone Encounter (Signed)
Prescription refill request for Eliquis received. Indication: a fib Last office visit: 12/15/20 Scr: 1.29 Age: 70 Weight: 97kg

## 2021-05-19 NOTE — Telephone Encounter (Signed)
Spoke with the patient. Patient called to inquire whether or not he could cut his 5 mg Eliquis tablets in half and take a 1/2 tab bid due to cost. Adv him that the his dosage is calculated and the recommended dosage is needed in order for the medication to be therapeutic.  Patient sts that he normally pays $55 for a 90 day supply. He went pick his refill of Elquis today and was told that he is in the donut hole and it would cost him $311 for 90 days. He did instead refill 30 days.  Adv the patient that I will fwd a message to our Pharmacist to see if they have any recommendations to assist with lowering his cost. Adv him that I did not think he would be eligible for patient assistance.

## 2021-05-19 NOTE — Telephone Encounter (Signed)
Yes unfortunately, the next step would be patient assistance until he gets out of the donut hole

## 2021-05-19 NOTE — Telephone Encounter (Signed)
Refill Request.  

## 2021-06-04 ENCOUNTER — Other Ambulatory Visit: Payer: Self-pay | Admitting: Cardiovascular Disease

## 2021-06-17 ENCOUNTER — Other Ambulatory Visit (INDEPENDENT_AMBULATORY_CARE_PROVIDER_SITE_OTHER): Payer: Self-pay | Admitting: Nurse Practitioner

## 2021-06-17 DIAGNOSIS — I714 Abdominal aortic aneurysm, without rupture, unspecified: Secondary | ICD-10-CM

## 2021-06-22 ENCOUNTER — Ambulatory Visit (INDEPENDENT_AMBULATORY_CARE_PROVIDER_SITE_OTHER): Payer: Medicare HMO | Admitting: Vascular Surgery

## 2021-06-22 ENCOUNTER — Encounter (INDEPENDENT_AMBULATORY_CARE_PROVIDER_SITE_OTHER): Payer: Self-pay | Admitting: Vascular Surgery

## 2021-06-22 ENCOUNTER — Other Ambulatory Visit: Payer: Self-pay

## 2021-06-22 ENCOUNTER — Ambulatory Visit (INDEPENDENT_AMBULATORY_CARE_PROVIDER_SITE_OTHER): Payer: Medicare HMO

## 2021-06-22 VITALS — BP 122/73 | HR 57 | Resp 16 | Wt 217.4 lb

## 2021-06-22 DIAGNOSIS — I2510 Atherosclerotic heart disease of native coronary artery without angina pectoris: Secondary | ICD-10-CM | POA: Diagnosis not present

## 2021-06-22 DIAGNOSIS — N1831 Chronic kidney disease, stage 3a: Secondary | ICD-10-CM | POA: Diagnosis not present

## 2021-06-22 DIAGNOSIS — E785 Hyperlipidemia, unspecified: Secondary | ICD-10-CM | POA: Diagnosis not present

## 2021-06-22 DIAGNOSIS — I714 Abdominal aortic aneurysm, without rupture, unspecified: Secondary | ICD-10-CM

## 2021-06-22 DIAGNOSIS — I713 Abdominal aortic aneurysm, ruptured, unspecified: Secondary | ICD-10-CM

## 2021-06-22 NOTE — Assessment & Plan Note (Signed)
Continue cardiac and antihypertensive medications as already ordered and reviewed, no changes at this time. Continue statin as ordered and reviewed, no changes at this time Nitrates PRN for chest pain  

## 2021-06-22 NOTE — Progress Notes (Signed)
MRN : [id number]  Stuart Harvey is a 70 y.o. (1951/10/17) male who presents with chief complaint of  Chief Complaint  Patient presents with   Follow-up    Ultrasound follow up  .  History of Present Illness: Patient returns today in follow up of his abdominal aortic aneurysm.  This was originally repaired back around 2008 by vascular surgeon no longer in the medical community.  He then had a type I and type III endoleak repaired by myself and Dr. Delana Meyer in 2014.  He has done reasonably well since that time.  At his last duplex last year, the aneurysm sac was under 4 cm in maximal diameter.  He denies any abdominal aortic aneurysm symptoms. Specifically, the patient denies new back or abdominal pain, or signs of peripheral embolization.  His duplex today shows a marked growth in his aortic sac size now measuring 7.1 cm in maximal diameter.  Current Outpatient Medications  Medication Sig Dispense Refill   buPROPion (WELLBUTRIN SR) 200 MG 12 hr tablet Take 1 tablet (200 mg total) by mouth 2 (two) times daily. 180 tablet 0   Cholecalciferol 50 MCG (2000 UT) CAPS Take 1 capsule by mouth daily.      citalopram (CELEXA) 20 MG tablet Take 1 tablet (20 mg total) by mouth daily. 90 tablet 4   ELIQUIS 5 MG TABS tablet Take 1 tablet (5 mg total) by mouth 2 (two) times daily. 180 tablet 0   ezetimibe (ZETIA) 10 MG tablet Take 1 tablet (10 mg total) by mouth daily. 90 tablet 3   levothyroxine (SYNTHROID) 75 MCG tablet Take 1 tablet (75 mcg total) by mouth daily. 90 tablet 3   metoprolol tartrate (LOPRESSOR) 25 MG tablet Take 0.5 tablets (12.5 mg total) by mouth 2 (two) times daily. Please contact office to schedule follow up visit. 90 tablet 0   omeprazole (PRILOSEC) 20 MG capsule Take 1 capsule (20 mg total) by mouth daily. 90 capsule 0   rosuvastatin (CRESTOR) 40 MG tablet Take 1 tablet (40 mg total) by mouth daily. 90 tablet 3   No current facility-administered medications for this visit.     Past Medical History:  Diagnosis Date   AAA (abdominal aortic aneurysm) ([place])    s/p surgical repair in 08/2007 by Dr. Hulda Humphrey   Anemia    Arthritis    both knees   CKD (chronic kidney disease), stage II    Coronary artery disease    a. 4-vCABG @ [facility] 06/2007 (LIMA-LAD, VG-RPDA, VG-OM3,VG-D2; b. LHC 2011: patent grafts EF 40%, c. LHC 06/05/18: LM nl, pLAD 50%, p-mLAD 50%, ostD1 70%, ost ramus 30%, ost-pLCx 40%, pLCx 100% (chronically occluded), OM3 100%, p-dRCA 100% (chronically occluded), LIMA-LAD patent, VG-RPDA patent, VG-OM3 mid-graft 60% and 80%   Depression    GERD (gastroesophageal reflux disease)    Hernia of abdominal cavity    Hyperlipidemia    Hypertension    Hypothyroidism     Past Surgical History:  Procedure Laterality Date   ABDOMINAL AORTIC ANEURYSM REPAIR  08/2007   ABDOMINAL AORTIC ANEURYSM REPAIR  11/2012   [facility]   CARDIAC CATHETERIZATION  2008, 2011   [facility]    CORONARY ARTERY BYPASS GRAFT  2008   CORONARY/GRAFT ANGIOGRAPHY N/A 06/05/2018   Procedure: CORONARY/GRAFT ANGIOGRAPHY;  Surgeon: Nelva Bush, MD;  Location: [facility];  Service: Cardiovascular;  Laterality: N/A;   EXPLORATORY LAPAROTOMY  1997   HERNIA REPAIR     JOINT REPLACEMENT  right knee   KNEE ARTHROPLASTY Left 09/19/2016   Procedure: COMPUTER ASSISTED TOTAL KNEE ARTHROPLASTY;  Surgeon: Dereck Leep, MD;  Location: [facility];  Service: Orthopedics;  Laterality: Left;   TOTAL KNEE ARTHROPLASTY Right 09/2016     Social History   Tobacco Use   Smoking status: Former    Packs/day: 1.00    Years: 35.00    Pack years: 35.00    Types: Cigarettes    Quit date: 12/16/2005    Years since quitting: 15.5   Smokeless tobacco: Current    Types: Chew   Tobacco comments:    pt states he very rarely uses smokeless tobacco   Vaping Use   Vaping Use: Never used  Substance Use Topics   Alcohol use: No   Drug use: No       Family History  Problem Relation Age of Onset   Heart  disease Mother    Heart attack Mother    Hypertension Mother    Heart attack Father    Hypertension Father    Kidney disease Brother    Depression Maternal Grandmother    Depression Maternal Grandfather    Heart disease Other    Heart attack Other    Prostate cancer Neg Hx    Kidney cancer Neg Hx    Stomach cancer Neg Hx    Colon cancer Neg Hx      Allergies  Allergen Reactions   Benzodiazepines     Paradoxical agitation and delirium   Codeine Nausea Only   Tetracycline Rash     REVIEW OF SYSTEMS (Negative unless checked)  Constitutional: '[]'$ Weight loss  '[]'$ Fever  '[]'$ Chills Cardiac: '[]'$ Chest pain   '[]'$ Chest pressure   '[]'$ Palpitations   '[]'$ Shortness of breath when laying flat   '[]'$ Shortness of breath at rest   '[]'$ Shortness of breath with exertion. Vascular:  '[]'$ Pain in legs with walking   '[]'$ Pain in legs at rest   '[]'$ Pain in legs when laying flat   '[]'$ Claudication   '[]'$ Pain in feet when walking  '[]'$ Pain in feet at rest  '[]'$ Pain in feet when laying flat   '[]'$ History of DVT   '[]'$ Phlebitis   '[]'$ Swelling in legs   '[]'$ Varicose veins   '[]'$ Non-healing ulcers Pulmonary:   '[]'$ Uses home oxygen   '[]'$ Productive cough   '[]'$ Hemoptysis   '[]'$ Wheeze  '[]'$ COPD   '[]'$ Asthma Neurologic:  '[]'$ Dizziness  '[]'$ Blackouts   '[]'$ Seizures   '[]'$ History of stroke   '[]'$ History of TIA  '[]'$ Aphasia   '[]'$ Temporary blindness   '[]'$ Dysphagia   '[]'$ Weakness or numbness in arms   '[]'$ Weakness or numbness in legs Musculoskeletal:  '[x]'$ Arthritis   '[]'$ Joint swelling   '[x]'$ Joint pain   '[]'$ Low back pain Hematologic:  '[]'$ Easy bruising  '[]'$ Easy bleeding   '[]'$ Hypercoagulable state   '[x]'$ Anemic   Gastrointestinal:  '[]'$ Blood in stool   '[]'$ Vomiting blood  '[x]'$ Gastroesophageal reflux/heartburn   '[]'$ Abdominal pain Genitourinary:  '[]'$ Chronic kidney disease   '[]'$ Difficult urination  '[]'$ Frequent urination  '[]'$ Burning with urination   '[]'$ Hematuria Skin:  '[]'$ Rashes   '[]'$ Ulcers   '[]'$ Wounds Psychological:  '[]'$ History of anxiety   '[x]'$  History of major depression.  Physical Examination  BP 122/73 (BP  Location: Right Arm)   Pulse (!) 57   Resp 16   Wt 217 lb 6.4 oz (98.6 kg)   BMI 29.48 kg/m  Gen:  WD/WN, NAD.  Appears younger than stated age Head: [place]/AT, No temporalis wasting. Ear/Nose/Throat: Hearing grossly intact, nares w/o erythema or drainage Eyes: Conjunctiva clear. Sclera non-icteric Neck: Supple.  Trachea  midline Pulmonary:  Good air movement, no use of accessory muscles.  Cardiac: Irregular Vascular:  Vessel Right Left  Radial Palpable Palpable                          PT Palpable Palpable  DP Palpable Palpable   Gastrointestinal: soft, non-tender/non-distended. No guarding/reflex.  Increased aortic impulse Musculoskeletal: M/S 5/5 throughout.  No deformity or atrophy.  No edema. Neurologic: Sensation grossly intact in extremities.  Symmetrical.  Speech is fluent.  Psychiatric: Judgment intact, Mood & affect appropriate for pt's clinical situation. Dermatologic: No rashes or ulcers noted.  No cellulitis or open wounds.      Labs No results found for this or any previous visit (from the past 2160 hour(s)).  Radiology VAS [place] EVAR DUPLEX  Result Date: 06/22/2021 Endovascular Aortic Repair Study (EVAR) Patient Name:  Stuart Harvey  Date of Exam:   06/22/2021 Medical Rec #: [id number]        Accession #:    [id number] Date of Birth: 07/25/1951        Patient Gender: M Patient Age:   23 years Exam Location:  [facility] Procedure:      VAS [place] EVAR DUPLEX Referring Phys: Eulogio Ditch --------------------------------------------------------------------------------  Indications: Follow up exam for EVAR.  Comparison Study: 06/19/2020 Performing Technologist: Almira Coaster RVS  Examination Guidelines: A complete evaluation includes B-mode imaging, spectral Doppler, color Doppler, and power Doppler as needed of all accessible portions of each vessel. Bilateral testing is considered an integral part of a complete examination. Limited examinations for reoccurring  indications may be performed as noted.  Endovascular Aortic Repair (EVAR): +----------+----------------+-------------------+-------------------+           Diameter AP (cm)Diameter Trans (cm)Velocities (cm/sec) +----------+----------------+-------------------+-------------------+ Aorta     6.00            7.01               27                  +----------+----------------+-------------------+-------------------+ Right Limb1.47            1.72               103                 +----------+----------------+-------------------+-------------------+ Left Limb 1.51            1.51               46                  +----------+----------------+-------------------+-------------------+  Summary: Abdominal Aorta: Patent endovascular aneurysm repair with no evidence of endoleak. The EVAR appears to be increased in diameter compared to prior study on 06/19/2020; 3.8cms to 7.01cms today.  *See table(s) above for measurements and observations.  Electronically signed by Leotis Pain MD on 06/22/2021 at 10:45:44 AM.   Final     Assessment/Plan  AAA (abdominal aortic aneurysm) ([place]) His duplex today shows a marked growth in his aortic sac size now measuring 7.1 cm in maximal diameter.  This represents a significant enlargement and needs to be evaluated in the near future.  When to go ahead and order a CT angiogram of the abdomen pelvis ASAP and then we can perform repair of the aneurysm to fix the likely endoleak based off the findings of the CT scan in the near future.  I have discussed the CT scan be  helpful in planning whether or not this is a proximal cuff, and limb extension, or a type II endoleak that would be coil embolization.  The patient voices his understanding.  Coronary artery disease Continue cardiac and antihypertensive medications as already ordered and reviewed, no changes at this time.  Continue statin as ordered and reviewed, no changes at this time  Nitrates PRN for chest  pain   Hyperlipidemia lipid control important in reducing the progression of atherosclerotic disease. Continue statin therapy   CKD (chronic kidney disease) stage 3, GFR 30-59 ml/min (HCC) Patient should push p.o. intake for his CT scan.  The CT scan is absolutely necessary to try to evaluate the anatomy for repair.    Leotis Pain, MD  06/22/2021 10:58 AM    This note was created with Dragon medical transcription system.  Any errors from dictation are purely unintentional

## 2021-06-22 NOTE — Assessment & Plan Note (Signed)
lipid control important in reducing the progression of atherosclerotic disease. Continue statin therapy  

## 2021-06-22 NOTE — Assessment & Plan Note (Signed)
Patient should push p.o. intake for his CT scan.  The CT scan is absolutely necessary to try to evaluate the anatomy for repair.

## 2021-06-22 NOTE — Assessment & Plan Note (Signed)
His duplex today shows a marked growth in his aortic sac size now measuring 7.1 cm in maximal diameter.  This represents a significant enlargement and needs to be evaluated in the near future.  When to go ahead and order a CT angiogram of the abdomen pelvis ASAP and then we can perform repair of the aneurysm to fix the likely endoleak based off the findings of the CT scan in the near future.  I have discussed the CT scan be helpful in planning whether or not this is a proximal cuff, and limb extension, or a type II endoleak that would be coil embolization.  The patient voices his understanding.

## 2021-06-28 ENCOUNTER — Telehealth (INDEPENDENT_AMBULATORY_CARE_PROVIDER_SITE_OTHER): Payer: Self-pay

## 2021-06-28 NOTE — Telephone Encounter (Signed)
Patient spouse was giving radiology number to schedule CT. They were informed once appointment is schedule to contact the office to schedule follow up.

## 2021-06-30 ENCOUNTER — Ambulatory Visit
Admission: RE | Admit: 2021-06-30 | Discharge: 2021-06-30 | Disposition: A | Discharge status: Home / Self Care | Payer: Medicare HMO | Source: Ambulatory Visit | Attending: Vascular Surgery | Admitting: Vascular Surgery

## 2021-06-30 ENCOUNTER — Other Ambulatory Visit: Payer: Self-pay

## 2021-06-30 DIAGNOSIS — I713 Abdominal aortic aneurysm, ruptured, unspecified: Secondary | ICD-10-CM

## 2021-06-30 DIAGNOSIS — R911 Solitary pulmonary nodule: Secondary | ICD-10-CM

## 2021-06-30 HISTORY — DX: Solitary pulmonary nodule: R91.1

## 2021-06-30 LAB — POCT I-STAT CREATININE: Creatinine, Ser: 1.4 mg/dL — ABNORMAL HIGH (ref 0.61–1.24)

## 2021-06-30 MED ORDER — IOHEXOL 350 MG/ML SOLN
85.0000 mL | Freq: Once | INTRAVENOUS | Status: AC | PRN
  Administered 2021-06-30: 85 mL via INTRAVENOUS

## 2021-07-06 ENCOUNTER — Ambulatory Visit (INDEPENDENT_AMBULATORY_CARE_PROVIDER_SITE_OTHER): Payer: Medicare HMO | Admitting: Vascular Surgery

## 2021-07-06 ENCOUNTER — Other Ambulatory Visit: Payer: Self-pay

## 2021-07-06 ENCOUNTER — Encounter (INDEPENDENT_AMBULATORY_CARE_PROVIDER_SITE_OTHER): Payer: Self-pay | Admitting: Vascular Surgery

## 2021-07-06 VITALS — BP 123/76 | HR 72 | Ht 71.0 in | Wt 215.0 lb

## 2021-07-06 DIAGNOSIS — I714 Abdominal aortic aneurysm, without rupture, unspecified: Secondary | ICD-10-CM

## 2021-07-06 DIAGNOSIS — I2510 Atherosclerotic heart disease of native coronary artery without angina pectoris: Secondary | ICD-10-CM

## 2021-07-06 DIAGNOSIS — E785 Hyperlipidemia, unspecified: Secondary | ICD-10-CM

## 2021-07-06 DIAGNOSIS — N1831 Chronic kidney disease, stage 3a: Secondary | ICD-10-CM

## 2021-07-06 NOTE — H&P (View-Only) (Signed)
MRN : [id number]  Stuart Harvey is a 70 y.o. (Mar 06, 1951) male who presents with chief complaint of  Chief Complaint  Patient presents with   Follow-up    CT results  .  History of Present Illness: Patient returns today in follow up of his abdominal aortic aneurysm.  He is doing okay and has no new complaints since his last visit earlier this month. I have independently reviewed his CT angiogram and showed it to the patient and his wife today.  Although the news is far more encouraging than what the duplex had suggested earlier this month, he does still have progression of his aneurysm in the right common iliac artery below the previous stent graft now to 3 cm.  This should be repaired.  The abdominal aortic aneurysm repair is intact and there is really no aneurysm sac around the stent graft.  He has had some aneurysmal degeneration at the visceral vessels but this is still less than 4 cm and he has a good seal proximally.  Current Outpatient Medications  Medication Sig Dispense Refill   buPROPion (WELLBUTRIN SR) 200 MG 12 hr tablet Take 1 tablet (200 mg total) by mouth 2 (two) times daily. 180 tablet 0   Cholecalciferol 50 MCG (2000 UT) CAPS Take 1 capsule by mouth daily.      citalopram (CELEXA) 20 MG tablet Take 1 tablet (20 mg total) by mouth daily. 90 tablet 4   ELIQUIS 5 MG TABS tablet Take 1 tablet (5 mg total) by mouth 2 (two) times daily. 180 tablet 0   ezetimibe (ZETIA) 10 MG tablet Take 1 tablet (10 mg total) by mouth daily. 90 tablet 3   levothyroxine (SYNTHROID) 75 MCG tablet Take 1 tablet (75 mcg total) by mouth daily. 90 tablet 3   metoprolol tartrate (LOPRESSOR) 25 MG tablet Take 0.5 tablets (12.5 mg total) by mouth 2 (two) times daily. Please contact office to schedule follow up visit. 90 tablet 0   omeprazole (PRILOSEC) 20 MG capsule Take 1 capsule (20 mg total) by mouth daily. 90 capsule 0   rosuvastatin (CRESTOR) 40 MG tablet Take 1 tablet (40 mg total) by mouth daily.  90 tablet 3   No current facility-administered medications for this visit.    Past Medical History:  Diagnosis Date   AAA (abdominal aortic aneurysm) ([place])    s/p surgical repair in 08/2007 by Dr. Hulda Humphrey   Anemia    Arthritis    both knees   CKD (chronic kidney disease), stage II    Coronary artery disease    a. 4-vCABG @ [facility] 06/2007 (LIMA-LAD, VG-RPDA, VG-OM3,VG-D2; b. LHC 2011: patent grafts EF 40%, c. [facility] 06/05/18: LM nl, pLAD 50%, p-mLAD 50%, ostD1 70%, ost ramus 30%, ost-pLCx 40%, pLCx 100% (chronically occluded), OM3 100%, p-dRCA 100% (chronically occluded), LIMA-LAD patent, VG-RPDA patent, VG-OM3 mid-graft 60% and 80%   Depression    GERD (gastroesophageal reflux disease)    Hernia of abdominal cavity    Hyperlipidemia    Hypertension    Hypothyroidism     Past Surgical History:  Procedure Laterality Date   ABDOMINAL AORTIC ANEURYSM REPAIR  08/2007   ABDOMINAL AORTIC ANEURYSM REPAIR  11/2012   [facility]   CARDIAC CATHETERIZATION  2008, 2011   [facility]    CORONARY ARTERY BYPASS GRAFT  2008   CORONARY/GRAFT ANGIOGRAPHY N/A 06/05/2018   Procedure: CORONARY/GRAFT ANGIOGRAPHY;  Surgeon: Nelva Bush, MD;  Location: [facility];  Service: Cardiovascular;  Laterality: N/A;  EXPLORATORY LAPAROTOMY  1997   HERNIA REPAIR     JOINT REPLACEMENT     right knee   KNEE ARTHROPLASTY Left 09/19/2016   Procedure: COMPUTER ASSISTED TOTAL KNEE ARTHROPLASTY;  Surgeon: Dereck Leep, MD;  Location: [facility];  Service: Orthopedics;  Laterality: Left;   TOTAL KNEE ARTHROPLASTY Right 09/2016     Social History   Tobacco Use   Smoking status: Former    Packs/day: 1.00    Years: 35.00    Pack years: 35.00    Types: Cigarettes    Quit date: 12/16/2005    Years since quitting: 15.5   Smokeless tobacco: Current    Types: Chew   Tobacco comments:    pt states he very rarely uses smokeless tobacco   Vaping Use   Vaping Use: Never used  Substance Use Topics   Alcohol use: No   Drug  use: No       Family History  Problem Relation Age of Onset   Heart disease Mother    Heart attack Mother    Hypertension Mother    Heart attack Father    Hypertension Father    Kidney disease Brother    Depression Maternal Grandmother    Depression Maternal Grandfather    Heart disease Other    Heart attack Other    Prostate cancer Neg Hx    Kidney cancer Neg Hx    Stomach cancer Neg Hx    Colon cancer Neg Hx      Allergies  Allergen Reactions   Benzodiazepines     Paradoxical agitation and delirium   Codeine Nausea Only   Tetracycline Rash    REVIEW OF SYSTEMS (Negative unless checked)   Constitutional: '[]'$ Weight loss  '[]'$ Fever  '[]'$ Chills Cardiac: '[]'$ Chest pain   '[]'$ Chest pressure   '[]'$ Palpitations   '[]'$ Shortness of breath when laying flat   '[]'$ Shortness of breath at rest   '[]'$ Shortness of breath with exertion. Vascular:  '[]'$ Pain in legs with walking   '[]'$ Pain in legs at rest   '[]'$ Pain in legs when laying flat   '[]'$ Claudication   '[]'$ Pain in feet when walking  '[]'$ Pain in feet at rest  '[]'$ Pain in feet when laying flat   '[]'$ History of DVT   '[]'$ Phlebitis   '[]'$ Swelling in legs   '[]'$ Varicose veins   '[]'$ Non-healing ulcers Pulmonary:   '[]'$ Uses home oxygen   '[]'$ Productive cough   '[]'$ Hemoptysis   '[]'$ Wheeze  '[]'$ COPD   '[]'$ Asthma Neurologic:  '[]'$ Dizziness  '[]'$ Blackouts   '[]'$ Seizures   '[]'$ History of stroke   '[]'$ History of TIA  '[]'$ Aphasia   '[]'$ Temporary blindness   '[]'$ Dysphagia   '[]'$ Weakness or numbness in arms   '[]'$ Weakness or numbness in legs Musculoskeletal:  '[x]'$ Arthritis   '[]'$ Joint swelling   '[x]'$ Joint pain   '[]'$ Low back pain Hematologic:  '[]'$ Easy bruising  '[]'$ Easy bleeding   '[]'$ Hypercoagulable state   '[x]'$ Anemic   Gastrointestinal:  '[]'$ Blood in stool   '[]'$ Vomiting blood  '[x]'$ Gastroesophageal reflux/heartburn   '[]'$ Abdominal pain Genitourinary:  '[]'$ Chronic kidney disease   '[]'$ Difficult urination  '[]'$ Frequent urination  '[]'$ Burning with urination   '[]'$ Hematuria Skin:  '[]'$ Rashes   '[]'$ Ulcers   '[]'$ Wounds Psychological:  '[]'$ History of anxiety    '[x]'$  History of major depression.  Physical Examination  BP 123/76   Pulse 72   Ht '5\' 11"'$  (1.803 m)   Wt 215 lb (97.5 kg)   BMI 29.99 kg/m  Gen:  WD/WN, NAD Head: [place]/AT, No temporalis wasting. Ear/Nose/Throat: Hearing grossly intact, nares w/o erythema or drainage Eyes:  Conjunctiva clear. Sclera non-icteric Neck: Supple.  Trachea midline Pulmonary:  Good air movement, no use of accessory muscles.  Cardiac: RRR, no JVD Vascular:  Vessel Right Left  Radial Palpable Palpable                          PT Palpable Palpable  DP Palpable Palpable   Gastrointestinal: soft, non-tender/non-distended. No guarding/reflex.  Musculoskeletal: M/S 5/5 throughout.  No deformity or atrophy. No edema. Neurologic: Sensation grossly intact in extremities.  Symmetrical.  Speech is fluent.  Psychiatric: Judgment intact, Mood & affect appropriate for pt's clinical situation. Dermatologic: No rashes or ulcers noted.  No cellulitis or open wounds.      Labs Recent Results (from the past 2160 hour(s))  I-STAT creatinine     Status: Abnormal   Collection Time: 06/30/21  1:50 PM  Result Value Ref Range   Creatinine, Ser 1.40 (H) 0.61 - 1.24 mg/dL    Radiology VAS [place] EVAR DUPLEX  Result Date: 06/22/2021 Endovascular Aortic Repair Study (EVAR) Patient Name:  [name]  Date of Exam:   06/22/2021 Medical Rec #: [id number]        Accession #:    [id number] Date of Birth: [birth year]        Patient Gender: M Patient Age:   19 years Exam Location:  [facility] Procedure:      VAS [place] EVAR DUPLEX Referring Phys: Eulogio Ditch --------------------------------------------------------------------------------  Indications: Follow up exam for EVAR.  Comparison Study: 06/19/2020 Performing Technologist: Almira Coaster RVS  Examination Guidelines: A complete evaluation includes B-mode imaging, spectral Doppler, color Doppler, and power Doppler as needed of all accessible portions of each vessel.  Bilateral testing is considered an integral part of a complete examination. Limited examinations for reoccurring indications may be performed as noted.  Endovascular Aortic Repair (EVAR): +----------+----------------+-------------------+-------------------+           Diameter AP (cm)Diameter Trans (cm)Velocities (cm/sec) +----------+----------------+-------------------+-------------------+ Aorta     6.00            7.01               27                  +----------+----------------+-------------------+-------------------+ Right Limb1.47            1.72               103                 +----------+----------------+-------------------+-------------------+ Left Limb 1.51            1.51               46                  +----------+----------------+-------------------+-------------------+  Summary: Abdominal Aorta: Patent endovascular aneurysm repair with no evidence of endoleak. The EVAR appears to be increased in diameter compared to prior study on 06/19/2020; 3.8cms to 7.01cms today.  *See table(s) above for measurements and observations.  Electronically signed by Leotis Pain MD on 06/22/2021 at 10:45:44 AM.   Final    CT Angio Abd/Pel w/ and/or w/o  Result Date: 06/30/2021 CLINICAL DATA:  AAA, pre-op planning, evaluate endoleak? EXAM: CTA ABDOMEN AND PELVIS WITHOUT AND WITH CONTRAST TECHNIQUE: Multidetector CT imaging of the abdomen and pelvis was performed using the standard protocol during bolus administration of intravenous contrast. Multiplanar reconstructed images and MIPs were obtained and reviewed to  evaluate the vascular anatomy. CONTRAST:  5m OMNIPAQUE IOHEXOL 350 MG/ML SOLN COMPARISON:  September 2008 FINDINGS: VASCULAR Aorta: Status post endovascular repair of the infrarenal abdominal aortic aneurysm with a bifurcated stent graft. Although the stent graft takes a sharp angulation at the bifurcation, the stent graft including its iliac limbs is widely patent. The excluded aneurysm  sac appears nearly completely resolved. There has been interval enlargement of the native abdominal aorta superior to the stent, with the juxtarenal abdominal aorta measuring 3.8 x 3.5 cm, previously 2.8 x 2.6 cm. Additionally, there is been a aneurysmal dilation of the right common iliac artery. The right iliac limb terminates, which measures up to 3.0 x 2.9 cm, previously 2.0 cm. This interval enlargement results in incomplete wall apposition of the distal aspect of the right iliac limb, however, there is no significant residual aneurysm sac that is being perfused. Celiac: Patent without evidence of aneurysm, dissection, vasculitis or significant stenosis. SMA: Patent without evidence of aneurysm, dissection, vasculitis or significant stenosis. IMA: Occluded at its origin.  Appears patent via retrograde flow. Renals: Both renal arteries are patent without evidence of aneurysm, dissection, vasculitis, fibromuscular dysplasia or significant stenosis. Single renal arteries bilaterally. Inflow: The iliac limbs of the stent graft are patent bilaterally. As described above, there is interval aneurysmal dilation of the right common iliac artery up to 3.0 cm, previously 2.0 cm. The remainder of the iliac arteries are normal in caliber and patent. Calcific atherosclerosis of the bilateral common femoral arteries results in mild stenosis. Veins: No obvious venous abnormality within the limitations of this arterial phase study. NON-VASCULAR Inferior chest: There is an 8 mm solid pulmonary nodule in the right middle lobe. Hepatobiliary: The liver is normal in size without focal abnormality. No intrahepatic or extrahepatic biliary ductal dilation. The gallbladder appears normal. Spleen: Normal in size without focal abnormality. Pancreas: No pancreatic ductal dilatation or surrounding inflammatory changes. Adrenals/Urinary Tract: Adrenal glands are unremarkable. Grossly similar appearance of innumerable cysts involving the left  renal lower pole, which measure simple fluid attenuation. Small cystic lesion in the right renal upper pole measures simple fluid attenuation. No hydronephrosis. Bladder is unremarkable. Stomach/Bowel: The stomach, small bowel and large bowel are normal in caliber without abnormal wall thickening or surrounding inflammatory changes. Sigmoid diverticulosis without active inflammatory changes. Reproductive: Prostate is unremarkable. Lymphatic: No enlarged lymph nodes in the abdomen or pelvis. Other: No abdominopelvic ascites. Musculoskeletal: Chronic left hip fracture with metallic densities. The soft tissues are unremarkable. Median sternotomy. IMPRESSION: VASCULAR 1. There has been endovascular repair of the infrarenal abdominal aortic aneurysm with a bifurcated stent graft. The stent graft remains widely patent with near complete resolution of the excluded aneurysm sac. No evidence of endoleak. 2. There is interval aneurysmal dilation of the right common iliac artery at the distal landing zone of the right iliac limb, which measures up to 3 cm, previously 2 cm in 2008. 3. There is interval aneurysmal dilation of the native abdominal aorta superior to the stent graft, with the juxtarenal abdominal aorta measuring up to 3.8 cm, previously 2.8 cm in 2008. NON-VASCULAR 1. There is an 8 mm solid pulmonary nodule in the right middle lobe. Non-contrast chest CT at 6-12 months is recommended. If the nodule is stable at time of repeat CT, then future CT at 18-24 months (from today's scan) is considered optional for low-risk patients, but is recommended for high-risk patients. This recommendation follows the consensus statement: Guidelines for Management of Incidental Pulmonary Nodules Detected on CT  Images: From the Fleischner Society 2017; Radiology 2017; 284:228-243. 2. Sigmoid diverticulosis. Electronically Signed   By: Albin Felling M.D.   On: 06/30/2021 16:24    Assessment/Plan Coronary artery disease Continue  cardiac and antihypertensive medications as already ordered and reviewed, no changes at this time.   Continue statin as ordered and reviewed, no changes at this time   Nitrates PRN for chest pain     Hyperlipidemia lipid control important in reducing the progression of atherosclerotic disease. Continue statin therapy     CKD (chronic kidney disease) stage 3, GFR 30-59 ml/min (HCC) No obvious dysfunction after his CT scan.  We will try to limit his contrast with his upcoming procedure.   AAA (abdominal aortic aneurysm) ([place]) I have independently reviewed his CT angiogram and showed it to the patient and his wife today.  Although the news is far more encouraging than what the duplex had suggested earlier this month, he does still have progression of his aneurysm in the right common iliac artery below the previous stent graft now to 3 cm.  This should be repaired.  The abdominal aortic aneurysm repair is intact and there is really no aneurysm sac around the stent graft.  He has had some aneurysmal degeneration at the visceral vessels but this is still less than 4 cm and he has a good seal proximally.  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  We may be able to land this just superior to the internal iliac artery and avoid coil embolization, but this will not be clear until we get into the procedure.  We discussed he may be able to go home the same day or may need to stay 1 night, but we will decide that also based on the sheath size and how the procedure goes.  He and his wife voiced their understanding and are agreeable to proceed.    Leotis Pain, MD  07/06/2021 10:13 AM    This note was created with Dragon medical transcription system.  Any errors from dictation are purely unintentional

## 2021-07-06 NOTE — Progress Notes (Signed)
MRN : [id number]  Stuart Harvey is a 70 y.o. (1951/07/13) male who presents with chief complaint of  Chief Complaint  Patient presents with   Follow-up    CT results  .  History of Present Illness: Patient returns today in follow up of his abdominal aortic aneurysm.  He is doing okay and has no new complaints since his last visit earlier this month. I have independently reviewed his CT angiogram and showed it to the patient and his wife today.  Although the news is far more encouraging than what the duplex had suggested earlier this month, he does still have progression of his aneurysm in the right common iliac artery below the previous stent graft now to 3 cm.  This should be repaired.  The abdominal aortic aneurysm repair is intact and there is really no aneurysm sac around the stent graft.  He has had some aneurysmal degeneration at the visceral vessels but this is still less than 4 cm and he has a good seal proximally.  Current Outpatient Medications  Medication Sig Dispense Refill   buPROPion (WELLBUTRIN SR) 200 MG 12 hr tablet Take 1 tablet (200 mg total) by mouth 2 (two) times daily. 180 tablet 0   Cholecalciferol 50 MCG (2000 UT) CAPS Take 1 capsule by mouth daily.      citalopram (CELEXA) 20 MG tablet Take 1 tablet (20 mg total) by mouth daily. 90 tablet 4   ELIQUIS 5 MG TABS tablet Take 1 tablet (5 mg total) by mouth 2 (two) times daily. 180 tablet 0   ezetimibe (ZETIA) 10 MG tablet Take 1 tablet (10 mg total) by mouth daily. 90 tablet 3   levothyroxine (SYNTHROID) 75 MCG tablet Take 1 tablet (75 mcg total) by mouth daily. 90 tablet 3   metoprolol tartrate (LOPRESSOR) 25 MG tablet Take 0.5 tablets (12.5 mg total) by mouth 2 (two) times daily. Please contact office to schedule follow up visit. 90 tablet 0   omeprazole (PRILOSEC) 20 MG capsule Take 1 capsule (20 mg total) by mouth daily. 90 capsule 0   rosuvastatin (CRESTOR) 40 MG tablet Take 1 tablet (40 mg total) by mouth daily.  90 tablet 3   No current facility-administered medications for this visit.    Past Medical History:  Diagnosis Date   AAA (abdominal aortic aneurysm) ([place])    s/p surgical repair in 08/2007 by Dr. Hulda Humphrey   Anemia    Arthritis    both knees   CKD (chronic kidney disease), stage II    Coronary artery disease    a. 4-vCABG @ [facility] 06/2007 (LIMA-LAD, VG-RPDA, VG-OM3,VG-D2; b. LHC 2011: patent grafts EF 40%, c. LHC 06/05/18: LM nl, pLAD 50%, p-mLAD 50%, ostD1 70%, ost ramus 30%, ost-pLCx 40%, pLCx 100% (chronically occluded), OM3 100%, p-dRCA 100% (chronically occluded), LIMA-LAD patent, VG-RPDA patent, VG-OM3 mid-graft 60% and 80%   Depression    GERD (gastroesophageal reflux disease)    Hernia of abdominal cavity    Hyperlipidemia    Hypertension    Hypothyroidism     Past Surgical History:  Procedure Laterality Date   ABDOMINAL AORTIC ANEURYSM REPAIR  08/2007   ABDOMINAL AORTIC ANEURYSM REPAIR  11/2012   [facility]   CARDIAC CATHETERIZATION  2008, 2011   [facility]    CORONARY ARTERY BYPASS GRAFT  2008   CORONARY/GRAFT ANGIOGRAPHY N/A 06/05/2018   Procedure: CORONARY/GRAFT ANGIOGRAPHY;  Surgeon: Nelva Bush, MD;  Location: [facility];  Service: Cardiovascular;  Laterality: N/A;  EXPLORATORY LAPAROTOMY  1997   HERNIA REPAIR     JOINT REPLACEMENT     right knee   KNEE ARTHROPLASTY Left 09/19/2016   Procedure: COMPUTER ASSISTED TOTAL KNEE ARTHROPLASTY;  Surgeon: Dereck Leep, MD;  Location: [facility];  Service: Orthopedics;  Laterality: Left;   TOTAL KNEE ARTHROPLASTY Right 09/2016     Social History   Tobacco Use   Smoking status: Former    Packs/day: 1.00    Years: 35.00    Pack years: 35.00    Types: Cigarettes    Quit date: 12/16/2005    Years since quitting: 15.5   Smokeless tobacco: Current    Types: Chew   Tobacco comments:    pt states he very rarely uses smokeless tobacco   Vaping Use   Vaping Use: Never used  Substance Use Topics   Alcohol use: No   Drug  use: No       Family History  Problem Relation Age of Onset   Heart disease Mother    Heart attack Mother    Hypertension Mother    Heart attack Father    Hypertension Father    Kidney disease Brother    Depression Maternal Grandmother    Depression Maternal Grandfather    Heart disease Other    Heart attack Other    Prostate cancer Neg Hx    Kidney cancer Neg Hx    Stomach cancer Neg Hx    Colon cancer Neg Hx      Allergies  Allergen Reactions   Benzodiazepines     Paradoxical agitation and delirium   Codeine Nausea Only   Tetracycline Rash    REVIEW OF SYSTEMS (Negative unless checked)   Constitutional: '[]'$ Weight loss  '[]'$ Fever  '[]'$ Chills Cardiac: '[]'$ Chest pain   '[]'$ Chest pressure   '[]'$ Palpitations   '[]'$ Shortness of breath when laying flat   '[]'$ Shortness of breath at rest   '[]'$ Shortness of breath with exertion. Vascular:  '[]'$ Pain in legs with walking   '[]'$ Pain in legs at rest   '[]'$ Pain in legs when laying flat   '[]'$ Claudication   '[]'$ Pain in feet when walking  '[]'$ Pain in feet at rest  '[]'$ Pain in feet when laying flat   '[]'$ History of DVT   '[]'$ Phlebitis   '[]'$ Swelling in legs   '[]'$ Varicose veins   '[]'$ Non-healing ulcers Pulmonary:   '[]'$ Uses home oxygen   '[]'$ Productive cough   '[]'$ Hemoptysis   '[]'$ Wheeze  '[]'$ COPD   '[]'$ Asthma Neurologic:  '[]'$ Dizziness  '[]'$ Blackouts   '[]'$ Seizures   '[]'$ History of stroke   '[]'$ History of TIA  '[]'$ Aphasia   '[]'$ Temporary blindness   '[]'$ Dysphagia   '[]'$ Weakness or numbness in arms   '[]'$ Weakness or numbness in legs Musculoskeletal:  '[x]'$ Arthritis   '[]'$ Joint swelling   '[x]'$ Joint pain   '[]'$ Low back pain Hematologic:  '[]'$ Easy bruising  '[]'$ Easy bleeding   '[]'$ Hypercoagulable state   '[x]'$ Anemic   Gastrointestinal:  '[]'$ Blood in stool   '[]'$ Vomiting blood  '[x]'$ Gastroesophageal reflux/heartburn   '[]'$ Abdominal pain Genitourinary:  '[]'$ Chronic kidney disease   '[]'$ Difficult urination  '[]'$ Frequent urination  '[]'$ Burning with urination   '[]'$ Hematuria Skin:  '[]'$ Rashes   '[]'$ Ulcers   '[]'$ Wounds Psychological:  '[]'$ History of anxiety    '[x]'$  History of major depression.  Physical Examination  BP 123/76   Pulse 72   Ht '5\' 11"'$  (1.803 m)   Wt 215 lb (97.5 kg)   BMI 29.99 kg/m  Gen:  WD/WN, NAD Head: [place]/AT, No temporalis wasting. Ear/Nose/Throat: Hearing grossly intact, nares w/o erythema or drainage Eyes:  Conjunctiva clear. Sclera non-icteric Neck: Supple.  Trachea midline Pulmonary:  Good air movement, no use of accessory muscles.  Cardiac: RRR, no JVD Vascular:  Vessel Right Left  Radial Palpable Palpable                          PT Palpable Palpable  DP Palpable Palpable   Gastrointestinal: soft, non-tender/non-distended. No guarding/reflex.  Musculoskeletal: M/S 5/5 throughout.  No deformity or atrophy. No edema. Neurologic: Sensation grossly intact in extremities.  Symmetrical.  Speech is fluent.  Psychiatric: Judgment intact, Mood & affect appropriate for pt's clinical situation. Dermatologic: No rashes or ulcers noted.  No cellulitis or open wounds.      Labs Recent Results (from the past 2160 hour(s))  I-STAT creatinine     Status: Abnormal   Collection Time: 06/30/21  1:50 PM  Result Value Ref Range   Creatinine, Ser 1.40 (H) 0.61 - 1.24 mg/dL    Radiology VAS [place] EVAR DUPLEX  Result Date: 06/22/2021 Endovascular Aortic Repair Study (EVAR) Patient Name:  [name]  Date of Exam:   06/22/2021 Medical Rec #: [id number]        Accession #:    [id number] Date of Birth: 10/02/1951        Patient Gender: M Patient Age:   88 years Exam [facility] Procedure:      VAS [place] EVAR DUPLEX Referring Phys: Eulogio Ditch --------------------------------------------------------------------------------  Indications: Follow up exam for EVAR.  Comparison Study: 06/19/2020 Performing Technologist: Almira Coaster RVS  Examination Guidelines: A complete evaluation includes B-mode imaging, spectral Doppler, color Doppler, and power Doppler as needed of all accessible portions of each vessel.  Bilateral testing is considered an integral part of a complete examination. Limited examinations for reoccurring indications may be performed as noted.  Endovascular Aortic Repair (EVAR): +----------+----------------+-------------------+-------------------+           Diameter AP (cm)Diameter Trans (cm)Velocities (cm/sec) +----------+----------------+-------------------+-------------------+ Aorta     6.00            7.01               27                  +----------+----------------+-------------------+-------------------+ Right Limb1.47            1.72               103                 +----------+----------------+-------------------+-------------------+ Left Limb 1.51            1.51               46                  +----------+----------------+-------------------+-------------------+  Summary: Abdominal Aorta: Patent endovascular aneurysm repair with no evidence of endoleak. The EVAR appears to be increased in diameter compared to prior study on 06/19/2020; 3.8cms to 7.01cms today.  *See table(s) above for measurements and observations.  Electronically signed by Leotis Pain MD on 06/22/2021 at 10:45:44 AM.   Final    CT Angio Abd/Pel w/ and/or w/o  Result Date: 06/30/2021 CLINICAL DATA:  AAA, pre-op planning, evaluate endoleak? EXAM: CTA ABDOMEN AND PELVIS WITHOUT AND WITH CONTRAST TECHNIQUE: Multidetector CT imaging of the abdomen and pelvis was performed using the standard protocol during bolus administration of intravenous contrast. Multiplanar reconstructed images and MIPs were obtained and reviewed to  evaluate the vascular anatomy. CONTRAST:  75m OMNIPAQUE IOHEXOL 350 MG/ML SOLN COMPARISON:  September 2008 FINDINGS: VASCULAR Aorta: Status post endovascular repair of the infrarenal abdominal aortic aneurysm with a bifurcated stent graft. Although the stent graft takes a sharp angulation at the bifurcation, the stent graft including its iliac limbs is widely patent. The excluded aneurysm  sac appears nearly completely resolved. There has been interval enlargement of the native abdominal aorta superior to the stent, with the juxtarenal abdominal aorta measuring 3.8 x 3.5 cm, previously 2.8 x 2.6 cm. Additionally, there is been a aneurysmal dilation of the right common iliac artery. The right iliac limb terminates, which measures up to 3.0 x 2.9 cm, previously 2.0 cm. This interval enlargement results in incomplete wall apposition of the distal aspect of the right iliac limb, however, there is no significant residual aneurysm sac that is being perfused. Celiac: Patent without evidence of aneurysm, dissection, vasculitis or significant stenosis. SMA: Patent without evidence of aneurysm, dissection, vasculitis or significant stenosis. IMA: Occluded at its origin.  Appears patent via retrograde flow. Renals: Both renal arteries are patent without evidence of aneurysm, dissection, vasculitis, fibromuscular dysplasia or significant stenosis. Single renal arteries bilaterally. Inflow: The iliac limbs of the stent graft are patent bilaterally. As described above, there is interval aneurysmal dilation of the right common iliac artery up to 3.0 cm, previously 2.0 cm. The remainder of the iliac arteries are normal in caliber and patent. Calcific atherosclerosis of the bilateral common femoral arteries results in mild stenosis. Veins: No obvious venous abnormality within the limitations of this arterial phase study. NON-VASCULAR Inferior chest: There is an 8 mm solid pulmonary nodule in the right middle lobe. Hepatobiliary: The liver is normal in size without focal abnormality. No intrahepatic or extrahepatic biliary ductal dilation. The gallbladder appears normal. Spleen: Normal in size without focal abnormality. Pancreas: No pancreatic ductal dilatation or surrounding inflammatory changes. Adrenals/Urinary Tract: Adrenal glands are unremarkable. Grossly similar appearance of innumerable cysts involving the left  renal lower pole, which measure simple fluid attenuation. Small cystic lesion in the right renal upper pole measures simple fluid attenuation. No hydronephrosis. Bladder is unremarkable. Stomach/Bowel: The stomach, small bowel and large bowel are normal in caliber without abnormal wall thickening or surrounding inflammatory changes. Sigmoid diverticulosis without active inflammatory changes. Reproductive: Prostate is unremarkable. Lymphatic: No enlarged lymph nodes in the abdomen or pelvis. Other: No abdominopelvic ascites. Musculoskeletal: Chronic left hip fracture with metallic densities. The soft tissues are unremarkable. Median sternotomy. IMPRESSION: VASCULAR 1. There has been endovascular repair of the infrarenal abdominal aortic aneurysm with a bifurcated stent graft. The stent graft remains widely patent with near complete resolution of the excluded aneurysm sac. No evidence of endoleak. 2. There is interval aneurysmal dilation of the right common iliac artery at the distal landing zone of the right iliac limb, which measures up to 3 cm, previously 2 cm in 2008. 3. There is interval aneurysmal dilation of the native abdominal aorta superior to the stent graft, with the juxtarenal abdominal aorta measuring up to 3.8 cm, previously 2.8 cm in 2008. NON-VASCULAR 1. There is an 8 mm solid pulmonary nodule in the right middle lobe. Non-contrast chest CT at 6-12 months is recommended. If the nodule is stable at time of repeat CT, then future CT at 18-24 months (from today's scan) is considered optional for low-risk patients, but is recommended for high-risk patients. This recommendation follows the consensus statement: Guidelines for Management of Incidental Pulmonary Nodules Detected on CT  Images: From the Fleischner Society 2017; Radiology 2017; 284:228-243. 2. Sigmoid diverticulosis. Electronically Signed   By: Albin Felling M.D.   On: 06/30/2021 16:24    Assessment/Plan Coronary artery disease Continue  cardiac and antihypertensive medications as already ordered and reviewed, no changes at this time.   Continue statin as ordered and reviewed, no changes at this time   Nitrates PRN for chest pain     Hyperlipidemia lipid control important in reducing the progression of atherosclerotic disease. Continue statin therapy     CKD (chronic kidney disease) stage 3, GFR 30-59 ml/min (HCC) No obvious dysfunction after his CT scan.  We will try to limit his contrast with his upcoming procedure.   AAA (abdominal aortic aneurysm) ([place]) I have independently reviewed his CT angiogram and showed it to the patient and his wife today.  Although the news is far more encouraging than what the duplex had suggested earlier this month, he does still have progression of his aneurysm in the right common iliac artery below the previous stent graft now to 3 cm.  This should be repaired.  The abdominal aortic aneurysm repair is intact and there is really no aneurysm sac around the stent graft.  He has had some aneurysmal degeneration at the visceral vessels but this is still less than 4 cm and he has a good seal proximally.  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  We may be able to land this just superior to the internal iliac artery and avoid coil embolization, but this will not be clear until we get into the procedure.  We discussed he may be able to go home the same day or may need to stay 1 night, but we will decide that also based on the sheath size and how the procedure goes.  He and his wife voiced their understanding and are agreeable to proceed.    Leotis Pain, MD  07/06/2021 10:13 AM    This note was created with Dragon medical transcription system.  Any errors from dictation are purely unintentional

## 2021-07-06 NOTE — Assessment & Plan Note (Signed)
I have independently reviewed his CT angiogram and showed it to the patient and his wife today.  Although the news is far more encouraging than what the duplex had suggested earlier this month, he does still have progression of his aneurysm in the right common iliac artery below the previous stent graft now to 3 cm.  This should be repaired.  The abdominal aortic aneurysm repair is intact and there is really no aneurysm sac around the stent graft.  He has had some aneurysmal degeneration at the visceral vessels but this is still less than 4 cm and he has a good seal proximally.  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  We may be able to land this just superior to the internal iliac artery and avoid coil embolization, but this will not be clear until we get into the procedure.  We discussed he may be able to go home the same day or may need to stay 1 night, but we will decide that also based on the sheath size and how the procedure goes.  He and his wife voiced their understanding and are agreeable to proceed.

## 2021-07-09 ENCOUNTER — Ambulatory Visit: Payer: Medicare HMO | Admitting: Internal Medicine

## 2021-07-20 ENCOUNTER — Encounter (INDEPENDENT_AMBULATORY_CARE_PROVIDER_SITE_OTHER): Payer: Self-pay

## 2021-07-22 ENCOUNTER — Encounter
Admission: RE | Admit: 2021-07-22 | Discharge: 2021-07-22 | Disposition: A | Discharge status: Home / Self Care | Payer: Medicare HMO | Source: Ambulatory Visit | Attending: Vascular Surgery | Admitting: Vascular Surgery

## 2021-07-22 ENCOUNTER — Other Ambulatory Visit (INDEPENDENT_AMBULATORY_CARE_PROVIDER_SITE_OTHER): Payer: Self-pay | Admitting: Nurse Practitioner

## 2021-07-22 ENCOUNTER — Other Ambulatory Visit: Payer: Self-pay

## 2021-07-22 NOTE — Patient Instructions (Addendum)
Your procedure is scheduled on: 07/29/21 Report to [facility] AT 12:30 (valet parking available for patients and visitors, free of charge) Vascular Radiology Department [phone] for any questions.  Remember: Instructions that are not followed completely may result in serious medical risk, up to and including death, or upon the discretion of your surgeon and anesthesiologist your surgery may need to be rescheduled.     _X__ 1. Do not eat food after midnight the night before your procedure.                 No gum chewing or hard candies.   __X__2.  On the morning of surgery brush your teeth with toothpaste and water, you                 may rinse your mouth with mouthwash if you wish.  Do not swallow any              toothpaste of mouthwash.     _X__ 3.  No Alcohol for 24 hours before or after surgery.   _X__ 4.  Do Not Smoke or use e-cigarettes For 24 Hours Prior to Your Surgery.                 Do not use any chewable tobacco products for at least 6 hours prior to                 surgery.  ____  5.  Bring all medications with you on the day of surgery if instructed.   __X__  6.  Notify your doctor if there is any change in your medical condition      (cold, fever, infections).     Do not wear jewelry, make-up, hairpins, clips or nail polish. Do not wear lotions, powders, or perfumes.  Do not shave body hair 48 hours prior to surgery. Men may shave face and neck. Do not bring valuables to the hospital.    [facility] is not responsible for any belongings or valuables.  Contacts, dentures/partials or body piercings may not be worn into surgery. Bring a case for your contacts, glasses or hearing aids, a denture cup will be supplied. Leave your suitcase in the car. After surgery it may be brought to your room. For patients admitted to the hospital, discharge time is determined by your treatment team.   Patients discharged the day of surgery will not be allowed to  drive home.   Please read over the following fact sheets that you were given:   CHG soap  __X__ Take these medicines the morning of surgery with A SIP OF WATER:    1. buPROPion (WELLBUTRIN SR) 200 MG 12 hr tablet  2. citalopram (CELEXA) 20 MG tablet  3. ezetimibe (ZETIA) 10 MG tablet  4. levothyroxine (SYNTHROID) 75 MCG tablet  5. metoprolol tartrate (LOPRESSOR) 12.5 MG tablet  6. omeprazole (PRILOSEC) 20 MG capsule  7. rosuvastatin (CRESTOR) 40 MG tablet  ____ Fleet Enema (as directed)   __X__ Use CHG Soap/SAGE wipes as directed  ____ Use inhalers on the day of surgery  ____ Stop metformin/Janumet/Farxiga 2 days prior to surgery    ____ Take 1/2 of usual insulin dose the night before surgery. No insulin the morning          of surgery.   __X__ Stop Blood Thinner Eliquis FOR 2 DAYS  __X__ Stop Anti-inflammatories 7 days before surgery such as Advil, Ibuprofen, Motrin,  BC or Goodies Powder, Naprosyn,  Naproxen, Aleve, Aspirin    __X__ Stop all herbal supplements, fish oil or vitamin E until after surgery.    ____ Bring C-Pap to the hospital.

## 2021-07-23 ENCOUNTER — Other Ambulatory Visit: Payer: Self-pay

## 2021-07-23 ENCOUNTER — Other Ambulatory Visit
Admission: RE | Admit: 2021-07-23 | Discharge: 2021-07-23 | Disposition: A | Discharge status: Home / Self Care | Payer: Medicare HMO | Source: Ambulatory Visit | Attending: Vascular Surgery | Admitting: Vascular Surgery

## 2021-07-23 ENCOUNTER — Telehealth: Payer: Self-pay | Admitting: *Deleted

## 2021-07-23 DIAGNOSIS — Z01818 Encounter for other preprocedural examination: Secondary | ICD-10-CM | POA: Diagnosis present

## 2021-07-23 DIAGNOSIS — I444 Left anterior fascicular block: Secondary | ICD-10-CM | POA: Diagnosis not present

## 2021-07-23 LAB — TYPE AND SCREEN
ABO/RH(D): O NEG
Antibody Screen: NEGATIVE

## 2021-07-23 LAB — BASIC METABOLIC PANEL
Anion gap: 5 (ref 5–15)
BUN: 44 mg/dL — ABNORMAL HIGH (ref 8–23)
CO2: 23 mmol/L (ref 22–32)
Calcium: 9.2 mg/dL (ref 8.9–10.3)
Chloride: 111 mmol/L (ref 98–111)
Creatinine, Ser: 1.53 mg/dL — ABNORMAL HIGH (ref 0.61–1.24)
GFR, Estimated: 49 mL/min — ABNORMAL LOW (ref >60)
Glucose, Bld: 67 mg/dL — ABNORMAL LOW (ref 70–99)
Potassium: 4.3 mmol/L (ref 3.5–5.1)
Sodium: 139 mmol/L (ref 135–145)

## 2021-07-23 LAB — CBC WITH DIFFERENTIAL/PLATELET
Abs Immature Granulocytes: 0.03 10*3/uL (ref 0.00–0.07)
Basophils Absolute: 0 10*3/uL (ref 0.0–0.1)
Basophils Relative: 0 %
Eosinophils Absolute: 0.4 10*3/uL (ref 0.0–0.5)
Eosinophils Relative: 4 %
HCT: 43.3 % (ref 39.0–52.0)
Hemoglobin: 14.7 g/dL (ref 13.0–17.0)
Immature Granulocytes: 0 %
Lymphocytes Relative: 46 %
Lymphs Abs: 4.1 10*3/uL — ABNORMAL HIGH (ref 0.7–4.0)
MCH: 30.7 pg (ref 26.0–34.0)
MCHC: 33.9 g/dL (ref 30.0–36.0)
MCV: 90.4 fL (ref 80.0–100.0)
Monocytes Absolute: 0.5 10*3/uL (ref 0.1–1.0)
Monocytes Relative: 5 %
Neutro Abs: 4 10*3/uL (ref 1.7–7.7)
Neutrophils Relative %: 45 %
Platelets: 162 10*3/uL (ref 150–400)
RBC: 4.79 MIL/uL (ref 4.22–5.81)
RDW: 13.8 % (ref 11.5–15.5)
WBC: 9.1 10*3/uL (ref 4.0–10.5)
nRBC: 0 % (ref 0.0–0.2)

## 2021-07-23 NOTE — Telephone Encounter (Signed)
Clinical pharmacist to review Eliquis 

## 2021-07-23 NOTE — Telephone Encounter (Signed)
Patient with diagnosis of afib on Eliquis for anticoagulation.    Procedure: ENDOVASCULAR REPAIR/STENT GRAFT  Date of procedure: 07/29/21   CHA2DS2-VASc Score = 3   This indicates a 3.2% annual risk of stroke. The patient's score is based upon: CHF History: 0 HTN History: 1 Diabetes History: 0 Stroke History: 0 Vascular Disease History: 1 Age Score: 1 Gender Score: 0    CrCl 53 ml/min  Per office protocol, patient can hold Eliquis for 2 days prior to procedure.

## 2021-07-23 NOTE — Telephone Encounter (Signed)
1. What type of surgery is being performed?  ENDOVASCULAR REPAIR/STENT GRAFT   2. When is this surgery scheduled?  07/29/2021     3. Are there any medications that need to be held prior to surgery?  Apixaban   4. Practice name and name of physician performing surgery?  Performing surgeon: Dr. Leotis Pain, MD  Requesting clearance: Honor Loh, FNP-C       5. Anesthesia type (none, local, MAC, general)? General   6. What is the office phone and fax number?    Phone: [phone]  Fax: [phone]   ATTENTION: Unable to create telephone message as per your standard workflow. Directed by [facility] providers to send requests for cardiac clearance to this pool for appropriate distribution to provider covering pre-operative clearances.

## 2021-07-23 NOTE — Telephone Encounter (Signed)
-----   Message from Karen Kitchens, NP sent at 07/23/2021  1:55 PM EDT ----- Regarding: Request for pre-operative cardiac clearance Request for pre-operative cardiac clearance:  NOTE: Please note 12 lead performed in PAT today (07/23/2021). There are anterolateral TWIs noted on the tracing.   1. What type of surgery is being performed?  ENDOVASCULAR REPAIR/STENT GRAFT  2. When is this surgery scheduled?  07/29/2021  3. Are there any medications that need to be held prior to surgery? Apixaban  4. Practice name and name of physician performing surgery?  Performing surgeon: Dr. Leotis Pain, MD Requesting clearance: Honor Loh, FNP-C    5. Anesthesia type (none, local, MAC, general)? General   6. What is the office phone and fax number?   Phone: [phone] Fax: [phone]  ATTENTION: Unable to create telephone message as per your standard workflow. Directed by [facility] providers to send requests for cardiac clearance to this pool for appropriate distribution to provider covering pre-operative clearances.   Honor Loh, MSN, APRN, FNP-C, CEN [facility]  Peri-operative Services Nurse Practitioner Phone: [phone] 07/23/21 1:55 PM

## 2021-07-24 ENCOUNTER — Other Ambulatory Visit: Payer: Self-pay | Admitting: Nurse Practitioner

## 2021-07-24 NOTE — Telephone Encounter (Signed)
Requested Prescriptions  Pending Prescriptions Disp Refills  . buPROPion (WELLBUTRIN SR) 200 MG 12 hr tablet [Pharmacy Med Name: BUPROPION HCL SR 200 MG TABLET] 180 tablet 0    Sig: Take 1 tablet (200 mg total) by mouth 2 (two) times daily.     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 ([place])   [place], [name], NP   1 year ago Major depressive disorder with single episode, in partial remission ([place])   [place], [name], NP   1 year ago Abdominal aortic aneurysm (AAA) without rupture ([place])   [place], Henrine Screws T, NP   1 year ago Lab test positive for detection of COVID-19 virus   [place], [place] T, NP   1 year ago Lab test positive for detection of COVID-19 virus   [place], Barbaraann Faster, NP      Future Appointments            In 1 week Vigg, Avanti, MD [facility], [facility]   In 1 week  [employer], [place]   In 1 month Arida, Mertie Clause, MD [place], [facility]           Passed - Completed PHQ-2 or PHQ-9 in the last 360 days      Passed - Last BP in normal range    BP Readings from Last 1 Encounters:  07/06/21 123/76

## 2021-07-26 ENCOUNTER — Telehealth (INDEPENDENT_AMBULATORY_CARE_PROVIDER_SITE_OTHER): Payer: Self-pay

## 2021-07-26 ENCOUNTER — Encounter: Payer: Self-pay | Admitting: Vascular Surgery

## 2021-07-26 ENCOUNTER — Encounter (INDEPENDENT_AMBULATORY_CARE_PROVIDER_SITE_OTHER): Payer: Self-pay

## 2021-07-26 NOTE — Telephone Encounter (Signed)
Patient calling to discuss if reason for delay in surgery was due to ekg .    Patient aware of below  and will contact his surgeons office for further discussion .

## 2021-07-26 NOTE — Telephone Encounter (Signed)
Called pt and made him aware of his new surgery, pre op, and Covid test dates. The pt voiced understanding.

## 2021-07-26 NOTE — Telephone Encounter (Signed)
If he has no new symptoms, I am not concerned about the EKG changes.  He can proceed with the planned surgery.

## 2021-07-26 NOTE — Telephone Encounter (Signed)
Call placed to pt, he has been made aware to hold his Eliqusi 2 days prior to his procedure.  He let me know that his surgery was rescheduled for 08/04/2021, therefore, advised pt his last dose of Eliquis should be the night of 08/01/2021.

## 2021-07-26 NOTE — Telephone Encounter (Signed)
   Name: Stuart Harvey  DOB: 01-Dec-1950  MRN: [id number]   Primary Cardiologist: [name], MD  Chart reviewed as part of pre-operative protocol coverage. Patient was contacted 07/26/2021 in reference to pre-operative risk assessment for pending surgery as outlined below.  Stuart Harvey was last seen on 12/15/20 by Dr. Fletcher Anon.  Since that day, Stuart Harvey has done well. He has a history of CABG with most recent heart cath in 2019 showing patent grafts, but disease in the SVG-OM treated medically. He is able to complete 4.0 METS without angina.   There was concern about TWI in anterior leads on his pre-anesthesia EKG. I connected with Dr. Fletcher Anon who reviewed the tracing. Due to lack of symptoms, he may proceed with surgery.   Per our clinical pharmacist: Per office protocol, patient can hold Eliquis for 2 days prior to procedure.  Therefore, based on ACC/AHA guidelines, the patient would be at acceptable risk for the planned procedure without further cardiovascular testing.   The patient was advised that if he develops new symptoms prior to surgery to contact our office to arrange for a follow-up visit, and he verbalized understanding.  I will route this recommendation to the requesting party via Epic fax function and remove from pre-op pool. Please call with questions.  Tami Lin Demorris Choyce, PA 07/26/2021, 11:53 AM

## 2021-07-27 ENCOUNTER — Ambulatory Visit: Payer: Medicare HMO | Admitting: Internal Medicine

## 2021-07-27 ENCOUNTER — Other Ambulatory Visit: Admission: RE | Admit: 2021-07-27 | Payer: Medicare HMO | Source: Ambulatory Visit

## 2021-07-28 ENCOUNTER — Encounter: Payer: Self-pay | Admitting: Vascular Surgery

## 2021-07-28 NOTE — Progress Notes (Signed)
Perioperative Services  Pre-Admission/Anesthesia Testing Clinical Review  Date: 07/28/21  Patient Demographics:  Name: Stuart Harvey DOB:   09/20/1951 MRN:   [id number]  Planned Surgical Procedure(s):    Case: [id number] Date/Time: 08/04/21 0800   Procedure: ENDOVASCULAR REPAIR/STENT GRAFT   Anesthesia type: General   Diagnosis: Iliac artery aneurysm ([place]) [I72.3]   Pre-op diagnosis: AAA   Iliac Aneurysm Repair   ANESTHESIA   GORE Rep  cc: [name]   Location: [facility]   Providers: Algernon Huxley, MD     NOTE: Available PAT nursing documentation and vital signs have been reviewed. Clinical nursing staff has updated patient's PMH/PSHx, current medication list, and drug allergies/intolerances to ensure comprehensive history available to assist in medical decision making as it pertains to the aforementioned surgical procedure and anticipated anesthetic course. Extensive review of available clinical information performed. [place] PMH and PSHx updated with any diagnoses/procedures that  may have been inadvertently omitted during his intake with the pre-admission testing department's nursing staff.  Clinical Discussion:  Stuart Harvey is a 70 y.o. male who is submitted for pre-surgical anesthesia review and clearance prior to him undergoing the above procedure. Patient is a Former Smoker (35 pack years; quit 12/2005). Pertinent PMH includes: CAD (s/p CABG), infrarenal AAA (s/p EVAR), iliac artery aneurysm, ventricular tachycardia, HTN, HLD, hypothyroidism, CKD-II, GERD (on daily PPI), anemia, OA, depression.  Patient is followed by cardiology Fletcher Anon, MD). He was last seen in the cardiology clinic on 12/15/2020; notes reviewed. At the time of his clinic visit, the patient denied any chest pain, shortness of breath, PND, orthopnea, palpitations, significant peripheral edema, vertiginous symptoms, or presyncope/syncope.  Patient with a PMH significant for cardiovascular  diagnoses.  Patient underwent diagnostic left heart catheterization on 06/18/2007 that revealed significant three-vessel CAD; 70% stenosis of the LAD, 40% stenosis of D1, 50% stenosis D2, 50 and 85% stenosis of LCx, and 90 and 100% stenosis of the RCA.  LVEDP was mildly elevated.  EF by ventriculography was 50%.  Recommendations were for CVTS consultation regarding CABG.    Patient underwent a four-vessel CABG procedure at [facility] in 06/25/2007.  LIMA-LAD, SVG-RPDA, SVG-OM3, and SVG-D2 bypass grafts were placed.  Patient underwent EVAR procedure on 08/29/2007 to repair a large infrarenal abdominal aortic aneurysm.   Repeat diagnostic left heart catheterization performed on 06/28/2010 revealing a mildly reduced left ventricular systolic function with an EF of 40%.  There was severe three-vessel CAD with patent bypass grafts.  Previously placed aortic stent graft noted to be stable.  Previous EVAR with noted type I and III endoleaks.  Patient underwent repair on 12/05/2012.  TTE performed on 06/03/2018 revealed a mildly dilated left ventricle.  Left ventricular systolic function normal with an EF of 50-55%.  Ascending aorta moderately dilated at 4.4 cm.  There was mild to moderate dilatation of the left atrium.  Coronary/graft angiography study was performed on 06/05/2018 revealing severe CAD with subtotal/total occlusions of the proximal-mid LAD, proximal LCx, and proximal RCA.  LIMA-LAD, SVG-D2, and SVG-RPDA bypass grafts widely patent.  SVG-OM 3 bypass graft patent but diffusely diseased with focal 60% and 80% stenoses and the middle of the graft.  Further intervention deferred opting for medical management and consideration of future PCI.  CTA of the abdomen and pelvis performed on 06/30/2021 revealed the previous infrarenal AAA repair with widely patent stent graft.  There was no evidence of endoleak.  There was interval aneurysmal dilatation of the native abdominal aorta  superior to the stent graft;  juxtarenal aorta measuring 3.8 cm (previously 2.8 cm in 2008).  Additionally, there was interval aneurysmal dilatation of the RIGHT common iliac artery at the distal landing zone of the RIGHT iliac limb; measured 3 cm (previously 2 cm in 2008).  Patient remains on daily anticoagulation therapy using apixaban; compliant with therapy with no evidence of GI bleeding.  Blood pressure well controlled at 110/64 on beta-blocker monotherapy.  Patient is on a statin and ezetimibe for his HLD.  He is not diabetic.  ECG in the office revealed sinus rhythm with PACs and a LAFB with nonspecific T wave changes. Functional capacity, as defined by DASI, is documented as being >/= 4 METS.  No changes were made to his medication regimen.  Patient to follow-up with outpatient cardiology in 9 months or sooner if needed.  Stuart Harvey is scheduled for an ENDOVASCULAR REPAIR/STENT GRAFT on 08/04/2021 with Dr. Leotis Pain, MD.  Given patient's past medical history significant for cardiovascular diagnoses, presurgical cardiac clearance was sought by the PAT team.  Patient presented to PAT for preoperative testing where he was noted to have anterolateral TWI's on his ECG; patient asymptomatic.  Patient was reviewed with cardiology Fletcher Anon, MD) and given the fact that patient is asymptomatic, there are no immediate concerns.  Per cardiology, "based on patient's past medical history and time since his last clinic visit, patient would be at an overall ACCEPTABLE risk for the planned procedure without further cardiovascular testing or intervention at this time".  Again, this patient is on daily anticoagulation therapy.  He has been instructed on recommendations for holding his apixaban for 2 days prior to his procedure with plans to restart as soon as postoperative bleeding risk felt to be minimized by his primary attending surgeon.  Patient is aware that his last dose of apixaban will be on 08/01/2021.  Patient denies previous  perioperative complications with anesthesia in the past. In review of the available records, it is noted that patient underwent a neuraxial anesthetic course here (ASA III) in 09/2015 without documented complications.   Vitals with BMI 07/22/2021 07/06/2021 06/22/2021  Height '5\' 11"'$  '5\' 11"'$  -  Weight 217 lbs 215 lbs 217 lbs 6 oz  BMI [id number] 30 -  Systolic - [id number]  Diastolic - 76 73  Pulse - 72 57    Providers/Specialists:   NOTE: Primary physician provider listed below. Patient may have been seen by APP or partner within same practice.   PROVIDER ROLE / SPECIALTY LAST Imelda Pillow, MD Vascular Surgery 07/06/2021  Charlynne Cousins, MD Primary Care Provider 01/05/2021  [name], MD Cardiology 12/15/2020   Allergies:  Benzodiazepines, Codeine, and Tetracycline  Current Home Medications:   No current facility-administered medications for this encounter.    buPROPion (WELLBUTRIN SR) 200 MG 12 hr tablet   Cholecalciferol 50 MCG (2000 UT) CAPS   citalopram (CELEXA) 20 MG tablet   ELIQUIS 5 MG TABS tablet   ezetimibe (ZETIA) 10 MG tablet   levothyroxine (SYNTHROID) 75 MCG tablet   metoprolol tartrate (LOPRESSOR) 25 MG tablet   omeprazole (PRILOSEC) 20 MG capsule   rosuvastatin (CRESTOR) 40 MG tablet   History:   Past Medical History:  Diagnosis Date   AAA (abdominal aortic aneurysm) ([place])    a.) s/p EVAR in 08/2007 by Dr. Hulda Humphrey. b.) CTA on 06/30/2021 --> interval aneurysmal dilitation superior to stent graft with the juxtarenal aorta measuring 3.8 cm.   Anemia  Arthritis    both knees   Chronic anticoagulation    Apixaban   CKD (chronic kidney disease), stage II    Coronary artery disease    a.) 4v CABG @ [facility] 06/2007 (LIMA-LAD, VG-RPDA, VG-OM3,VG-D2); b.) LHC 2011: patent grafts EF 40%, c.) LHC 06/05/18: LM nl, pLAD 50%, p-mLAD 50%, ostD1 70%, ost ramus 30%, ost-pLCx 40%, pLCx 100% (chronically occluded), OM3 100%, p-dRCA 100% (chronically occluded), LIMA-LAD patent,  VG-RPDA patent, VG-OM3 mid-graft 60% and 80%   Depression    GERD (gastroesophageal reflux disease)    Hernia of abdominal cavity    History of 2019 novel coronavirus disease (COVID-19) 09/2019   Hyperlipidemia    Hypertension    Hypothyroidism    Iliac artery aneurysm, right ([place])    a.) at the distal landing zone of the right iliac limb measuring 3 cm   Right middle lobe pulmonary nodule 06/30/2021   a.) measured 8 mm by CT on 06/30/2021.   S/P CABG x 4 06/25/2007   a.) LIMA-LAD, SVG-RPDA, SVG-OM3, SVG-D2   Sigmoid diverticulosis    Ventricular tachycardia ([place]) 05/2018   a.) found unresponsive by EMS; VT noted --> defibrillation achieved ROSC. Briefly intubated. Episode felt to be secondary to heat stoke.   Past Surgical History:  Procedure Laterality Date   ABDOMINAL AORTIC ANEURYSM REPAIR N/A 08/29/2007   Procedure: EVAR; Location: [place]; Surgeon: Collene Schlichter, MD   ABDOMINAL AORTIC ANEURYSM REPAIR N/A 12/05/2012   Procedure: Abdominal aortic aneurysm of approximately 6 cm in maximal diameter status post previous endovascular repair with type I and III endoleaks; Location: [place]; Surgeon: Leotis Pain, MD   CARDIAC CATHETERIZATION Left 06/28/2010   3v CAD with patent CABG grafts; LVEF 40%; stable aortic stent graft; Location: [place]; Surgeon: Serafina Royals, MD   CARDIAC CATHETERIZATION Left 06/18/2007   3v CAD; LVEF 50%; refer to CVTS for CABG; Location: [place]; Surgeon: [name], MD   CORONARY ARTERY BYPASS GRAFT N/A 06/25/2007   Procedure: 4v CABG (LIMA-LAD, SVG-RPDA, SVG-OM3, SVG-D2); Location: [facility]; Surgeon: Marnee Guarneri, MD   CORONARY/GRAFT ANGIOGRAPHY N/A 06/05/2018   Procedure: Remus Blake ANGIOGRAPHY;  Surgeon: Nelva Bush, MD;  Location: [facility];  Service: Cardiovascular;  Laterality: N/A;   EXPLORATORY LAPAROTOMY  1997   HERNIA REPAIR     KNEE ARTHROPLASTY Left 09/19/2016   Procedure: COMPUTER ASSISTED TOTAL KNEE ARTHROPLASTY;  Surgeon: Dereck Leep, MD;  Location: [facility];  Service: Orthopedics;  Laterality: Left;   TOTAL KNEE ARTHROPLASTY Right 09/2016   Family History  Problem Relation Age of Onset   Heart disease Mother    Heart attack Mother    Hypertension Mother    Heart attack Father    Hypertension Father    Kidney disease Brother    Depression Maternal Grandmother    Depression Maternal Grandfather    Heart disease Other    Heart attack Other    Prostate cancer Neg Hx    Kidney cancer Neg Hx    Stomach cancer Neg Hx    Colon cancer Neg Hx    Social History   Tobacco Use   Smoking status: Former    Packs/day: 1.00    Years: 35.00    Pack years: 35.00    Types: Cigarettes    Quit date: 12/16/2005    Years since quitting: 15.6   Smokeless tobacco: Current    Types: Chew   Tobacco comments:    pt states he very rarely uses smokeless tobacco   Vaping Use  Vaping Use: Never used  Substance Use Topics   Alcohol use: No   Drug use: No    Pertinent Clinical Results:  LABS: Labs reviewed: Acceptable for surgery.  No visits with results within 3 Day(s) from this visit.  Latest known visit with results is:  Hospital Outpatient Visit on 07/23/2021  Component Date Value Ref Range Status   WBC 07/23/2021 9.1  4.0 - 10.5 K/uL Final   RBC 07/23/2021 4.79  4.22 - 5.81 MIL/uL Final   Hemoglobin 07/23/2021 14.7  13.0 - 17.0 g/dL Final   HCT 07/23/2021 43.3  39.0 - 52.0 % Final   MCV 07/23/2021 90.4  80.0 - 100.0 fL Final   MCH 07/23/2021 30.7  26.0 - 34.0 pg Final   MCHC 07/23/2021 33.9  30.0 - 36.0 g/dL Final   RDW 07/23/2021 13.8  11.5 - 15.5 % Final   Platelets 07/23/2021 162  150 - 400 K/uL Final   nRBC 07/23/2021 0.0  0.0 - 0.2 % Final   Neutrophils Relative % 07/23/2021 45  % Final   Neutro Abs 07/23/2021 4.0  1.7 - 7.7 K/uL Final   Lymphocytes Relative 07/23/2021 46  % Final   Lymphs Abs 07/23/2021 4.1 (A) 0.7 - 4.0 K/uL Final   Monocytes Relative 07/23/2021 5  % Final   Monocytes Absolute  07/23/2021 0.5  0.1 - 1.0 K/uL Final   Eosinophils Relative 07/23/2021 4  % Final   Eosinophils Absolute 07/23/2021 0.4  0.0 - 0.5 K/uL Final   Basophils Relative 07/23/2021 0  % Final   Basophils Absolute 07/23/2021 0.0  0.0 - 0.1 K/uL Final   Immature Granulocytes 07/23/2021 0  % Final   Abs Immature Granulocytes 07/23/2021 0.03  0.00 - 0.07 K/uL Final   Performed at [facility], [place], [place] [address]   Sodium 07/23/2021 139  135 - 145 mmol/L Final   Potassium 07/23/2021 4.3  3.5 - 5.1 mmol/L Final   Chloride 07/23/2021 111  98 - 111 mmol/L Final   CO2 07/23/2021 23  22 - 32 mmol/L Final   Glucose, Bld 07/23/2021 67 (A) 70 - 99 mg/dL Final   Glucose reference range applies only to samples taken after fasting for at least 8 hours.   BUN 07/23/2021 44 (A) 8 - 23 mg/dL Final   Creatinine, Ser 07/23/2021 1.53 (A) 0.61 - 1.24 mg/dL Final   Calcium 07/23/2021 9.2  8.9 - 10.3 mg/dL Final   GFR, Estimated 07/23/2021 49 (A) >60 mL/min Final   Comment: (NOTE) Calculated using the CKD-EPI Creatinine Equation (2021)    Anion gap 07/23/2021 5  5 - 15 Final   Performed at [facility], [address] [place], [place] [address]   ABO/RH(D) 07/23/2021 O NEG   Final   Antibody Screen 07/23/2021 NEG   Final   Sample Expiration 07/23/2021 08/06/2021,2359   Final   Extend sample reason 07/23/2021    Final                   Value:NO TRANSFUSIONS OR PREGNANCY IN THE PAST 3 MONTHS Performed at [facility], [address] [place], [place] [address]     ECG: Date: 07/23/2021 Time ECG obtained: 1327 PM Rate: 72 bpm Rhythm:  Sinus rhythm with PACs in a pattern of bigeminy; LAFB Axis (leads I and aVF): Normal Intervals: PR 168 ms. QRS 122 ms. QTc 440 ms. ST segment and T wave changes: Anterolateral T wave inversions (leads I,  V2-V5); asymptomatic ; reviewed with cardiologist.  Comparison: Similar to previous tracing obtained on  12/15/2020  IMAGING / PROCEDURES: VASCULAR ULTRASOUND EVAR DUPLEX performed on 06/22/2021 Patent EVAR with no evidence of endoleak EVAR appears to be increased in diameter compared to prior study on 06/29/2020; 3.8 cm to 7.01 cm today  CORONARY/GRAFT ANGIOGRAPHY STUDY performed on 06/05/2018 Severe native coronary artery disease with subtotal/total occlusions of the proximal-mid LAD, proximal LCx, and proximal RCA Widely patent LIMA-LAD, SVG-D2, and SVG-RPDA Patent but diffusely diseased SVG-OM 3 graft.  There are sequential focal 60% and 80% stenoses in the middle of the graft.  Intervention was not attempted due to the patient's inability to remain still with table and consistently follow commands, as well as recent AKI in the setting of rhabdomyolysis.  Additionally, this graft supplies a relatively small territory. Recommendations: Continue medical therapy and secondary prevention.  If patient has signs and symptoms of ischemia or recurrent V. tach, consider PCI to SVG-OM 3 once mental status improves.  TRANSTHORACIC ECHOCARDIOGRAM performed on 06/03/2018 Left ventricular systolic function normal with an EF of 50-55% Left ventricular cavity size mildly dilated No significant regional wall motion abnormalities Left ventricular diastolic parameters normal Ascending aorta moderately dilated at 4.4 cm LAA mildly to moderately dilated Right ventricular systolic function normal  LEFT HEART CATHETERIZATION AND CORONARY ANGIOGRAPHY performed on 06/28/2010 Mild left ventricular systolic dysfunction with an EF of 40% Severe three-vessel CAD 100% stenosis of the proximal LAD 100% stenosis of the proximal LCx 100% stenosis of the proximal RCA Patent bypass grafts LIMA-LAD graft no evidence of disease SVG-OM1 showed moderate luminal irregularities SVG-OM2 showed minor luminal irregularities SVG-distal RCA showed minor luminal irregularities Stable aortic stent graft Moderately severe  atheroma Recommendations Defer further cardiac interventions at this time Continue aggressive medical management  CORONARY ARTERY BYPASS GRAFTING performed on 06/25/2007 Four-vessel CABG procedure LIMA-LAD SVG-RPDA SVG-OM3 SVG-D2  LEFT HEART CATHETERIZATION AND CORONARY ANGIOGRAPHY performed on 06/18/2007 Severe three-vessel CAD 70% stenosis LAD 40% stenosis D1 70% stenosis D2 85% stenosis LCx 20% stenosis RCA Borderline systemic hypertension and mildly elevated LVEDP Recommendations: consult with CVTS for consideration of CABG procedure  Impression and Plan:  [name] has been referred for pre-anesthesia review and clearance prior to him undergoing the planned anesthetic and procedural courses. Available labs, pertinent testing, and imaging results were personally reviewed by me.  ECG changes reviewed with cardiology APP and primary cardiologist.  Given the fact that he was asymptomatic, there was no cause for immediate concern.  This patient was therefore cleared by cardiology with an overall ACCEPTABLE risk of significant perioperative cardiovascular complications.  Based on clinical review performed today (07/28/21), barring any significant acute changes in the patient's overall condition, it is anticipated that he will be able to proceed with the planned surgical intervention. Any acute changes in clinical condition may necessitate his procedure being postponed and/or cancelled. Patient will meet with anesthesia team (MD and/or CRNA) on the day of his procedure for preoperative evaluation/assessment. Questions regarding anesthetic course will be fielded at that time.   Pre-surgical instructions were reviewed with the patient during his PAT appointment and questions were fielded by PAT clinical staff. Patient was advised that if any questions or concerns arise prior to his procedure then he should return a call to PAT and/or his surgeon's office to discuss.  Honor Loh, MSN,  APRN, FNP-C, CEN [facility] - Closed  Peri-operative Services Nurse Practitioner Phone: [phone] Fax: [phone] 07/28/21 9:26 AM  NOTE:  This note has been prepared using [employer]. Despite my best ability to proofread, there is always the potential that unintentional transcriptional errors may still occur from this process.

## 2021-07-29 ENCOUNTER — Encounter: Admission: RE | Payer: Self-pay | Source: Home / Self Care

## 2021-07-29 ENCOUNTER — Inpatient Hospital Stay: Admission: RE | Admit: 2021-07-29 | Payer: Medicare HMO | Source: Home / Self Care | Admitting: Vascular Surgery

## 2021-07-29 DIAGNOSIS — I723 Aneurysm of iliac artery: Secondary | ICD-10-CM

## 2021-07-29 SURGERY — INSERTION, ENDOVASCULAR STENT GRAFT, AORTA, ABDOMINAL
Anesthesia: General

## 2021-07-30 ENCOUNTER — Encounter: Payer: Self-pay | Admitting: Internal Medicine

## 2021-07-30 ENCOUNTER — Ambulatory Visit (INDEPENDENT_AMBULATORY_CARE_PROVIDER_SITE_OTHER): Payer: Medicare HMO | Admitting: Internal Medicine

## 2021-07-30 ENCOUNTER — Inpatient Hospital Stay: Admission: RE | Admit: 2021-07-30 | Payer: Medicare HMO | Source: Ambulatory Visit

## 2021-07-30 ENCOUNTER — Other Ambulatory Visit: Payer: Self-pay

## 2021-07-30 VITALS — BP 136/79 | HR 58 | Temp 98.1°F | Ht 70.5 in | Wt 220.8 lb

## 2021-07-30 DIAGNOSIS — I714 Abdominal aortic aneurysm, without rupture, unspecified: Secondary | ICD-10-CM

## 2021-07-30 DIAGNOSIS — I10 Essential (primary) hypertension: Secondary | ICD-10-CM | POA: Insufficient documentation

## 2021-07-30 DIAGNOSIS — M170 Bilateral primary osteoarthritis of knee: Secondary | ICD-10-CM

## 2021-07-30 DIAGNOSIS — E039 Hypothyroidism, unspecified: Secondary | ICD-10-CM | POA: Diagnosis not present

## 2021-07-30 DIAGNOSIS — Z1211 Encounter for screening for malignant neoplasm of colon: Secondary | ICD-10-CM | POA: Diagnosis not present

## 2021-07-30 MED ORDER — MELATONIN 3 MG PO TABS: 3.0000 mg | ORAL_TABLET | Freq: Every day | ORAL | 1 refills | Status: DC

## 2021-07-30 NOTE — Progress Notes (Signed)
BP 136/79   Pulse (!) 58   Temp 98.1 F (36.7 C) (Oral)   Ht 5' 10.5" (1.791 m)   Wt 220 lb 12.8 oz (100.2 kg)   SpO2 98%   BMI 31.23 kg/m    Subjective:    Patient ID: Stuart Harvey, male    DOB: [birth year], 70 y.o.   MRN: [id number]  Chief Complaint  Patient presents with   Depression   Hyperlipidemia   Hypertension   Hypothyroidism    HPI: Stuart Harvey is a 70 y.o. male  Abdominal aortic aneurysm repair next week Wednesday @ [place].  Has had 2 anerysms one was repaired 10 yrs ago. Repair of his right iliac artery aneurysm may include extension of the stent graft beyond the right internal iliac artery and require coil embolization of the right internal iliac arter  H.O CAD s/p CABG , AAA x 2 s/p repair of one, other one scheudled for repair this week.   Depression        This is a chronic problem.  The current episode started 1 to 4 weeks ago.   The onset quality is gradual.   Associated symptoms include no decreased concentration, no fatigue, no helplessness, no hopelessness, does not have insomnia, not irritable, no restlessness and no appetite change.   Pertinent negatives include no anxiety. Hyperlipidemia This is a chronic problem. The problem is controlled.  Hypertension This is a chronic problem. The current episode started more than 1 year ago. The problem is unchanged. Pertinent negatives include no anxiety or malaise/fatigue.   Chief Complaint  Patient presents with   Depression   Hyperlipidemia   Hypertension   Hypothyroidism    Relevant past medical, surgical, family and social history reviewed and updated as indicated. Interim medical history since our last visit reviewed. Allergies and medications reviewed and updated.  Review of Systems  Constitutional:  Negative for appetite change, fatigue and malaise/fatigue.  Psychiatric/Behavioral:  Positive for depression. Negative for decreased concentration. The patient does not have insomnia.     Per HPI unless specifically indicated above     Objective:    BP 136/79   Pulse (!) 58   Temp 98.1 F (36.7 C) (Oral)   Ht 5' 10.5" (1.791 m)   Wt 220 lb 12.8 oz (100.2 kg)   SpO2 98%   BMI 31.23 kg/m   Wt Readings from Last 3 Encounters:  07/30/21 220 lb 12.8 oz (100.2 kg)  07/22/21 217 lb (98.4 kg)  07/06/21 215 lb (97.5 kg)    Physical Exam Vitals and nursing note reviewed.  Constitutional:      General: He is not irritable.He is not in acute distress.    Appearance: Normal appearance. He is not ill-appearing or diaphoretic.  HENT:     Head: Normocephalic and atraumatic.     Right Ear: Tympanic membrane and external ear normal. There is no impacted cerumen.     Left Ear: External ear normal.     Nose: No congestion or rhinorrhea.     Mouth/Throat:     Pharynx: No oropharyngeal exudate or posterior oropharyngeal erythema.  Eyes:     Conjunctiva/sclera: Conjunctivae normal.     Pupils: Pupils are equal, round, and reactive to light.  Cardiovascular:     Rate and Rhythm: Normal rate and regular rhythm.     Heart sounds: No murmur heard.   No friction rub. No gallop.  Pulmonary:     Effort: No respiratory distress.  Breath sounds: No stridor. No wheezing or rhonchi.  Chest:     Chest wall: No tenderness.  Abdominal:     General: Abdomen is flat. Bowel sounds are normal.     Palpations: Abdomen is soft. There is no mass.     Tenderness: There is no abdominal tenderness.  Musculoskeletal:     Cervical back: Normal range of motion and neck supple. No rigidity or tenderness.     Left lower leg: No edema.  Skin:    General: Skin is warm and dry.  Neurological:     Mental Status: He is alert.    Results for orders placed or performed during the hospital encounter of 07/23/21  CBC WITH DIFFERENTIAL  Result Value Ref Range   WBC 9.1 4.0 - 10.5 K/uL   RBC 4.79 4.22 - 5.81 MIL/uL   Hemoglobin 14.7 13.0 - 17.0 g/dL   HCT 43.3 39.0 - 52.0 %   MCV 90.4 80.0 -  100.0 fL   MCH 30.7 26.0 - 34.0 pg   MCHC 33.9 30.0 - 36.0 g/dL   RDW 13.8 11.5 - 15.5 %   Platelets 162 150 - 400 K/uL   nRBC 0.0 0.0 - 0.2 %   Neutrophils Relative % 45 %   Neutro Abs 4.0 1.7 - 7.7 K/uL   Lymphocytes Relative 46 %   Lymphs Abs 4.1 (H) 0.7 - 4.0 K/uL   Monocytes Relative 5 %   Monocytes Absolute 0.5 0.1 - 1.0 K/uL   Eosinophils Relative 4 %   Eosinophils Absolute 0.4 0.0 - 0.5 K/uL   Basophils Relative 0 %   Basophils Absolute 0.0 0.0 - 0.1 K/uL   Immature Granulocytes 0 %   Abs Immature Granulocytes 0.03 0.00 - 0.07 K/uL  Basic metabolic panel  Result Value Ref Range   Sodium 139 135 - 145 mmol/L   Potassium 4.3 3.5 - 5.1 mmol/L   Chloride 111 98 - 111 mmol/L   CO2 23 22 - 32 mmol/L   Glucose, Bld 67 (L) 70 - 99 mg/dL   BUN 44 (H) 8 - 23 mg/dL   Creatinine, Ser 1.53 (H) 0.61 - 1.24 mg/dL   Calcium 9.2 8.9 - 10.3 mg/dL   GFR, Estimated 49 (L) >60 mL/min   Anion gap 5 5 - 15  Type and screen  Result Value Ref Range   ABO/RH(D) O NEG    Antibody Screen NEG    Sample Expiration 08/06/2021,2359    Extend sample reason      NO TRANSFUSIONS OR PREGNANCY IN THE PAST 3 MONTHS Performed at [facility], [address] [place], [place] [address]         Current Outpatient Medications:    buPROPion (WELLBUTRIN SR) 200 MG 12 hr tablet, Take 1 tablet (200 mg total) by mouth 2 (two) times daily., Disp: 180 tablet, Rfl: 0   Cholecalciferol 50 MCG (2000 UT) CAPS, Take 1 capsule by mouth daily. , Disp: , Rfl:    citalopram (CELEXA) 20 MG tablet, Take 1 tablet (20 mg total) by mouth daily., Disp: 90 tablet, Rfl: 4   ELIQUIS 5 MG TABS tablet, Take 1 tablet (5 mg total) by mouth 2 (two) times daily., Disp: 180 tablet, Rfl: 0   ezetimibe (ZETIA) 10 MG tablet, Take 1 tablet (10 mg total) by mouth daily., Disp: 90 tablet, Rfl: 3   levothyroxine (SYNTHROID) 75 MCG tablet, Take 1 tablet (75 mcg total) by mouth daily., Disp: 90 tablet, Rfl: 3  metoprolol tartrate  (LOPRESSOR) 25 MG tablet, Take 0.5 tablets (12.5 mg total) by mouth 2 (two) times daily. Please contact office to schedule follow up visit., Disp: 90 tablet, Rfl: 0   omeprazole (PRILOSEC) 20 MG capsule, Take 1 capsule (20 mg total) by mouth daily., Disp: 90 capsule, Rfl: 0   rosuvastatin (CRESTOR) 40 MG tablet, Take 1 tablet (40 mg total) by mouth daily., Disp: 90 tablet, Rfl: 3    Assessment & Plan:  Hypothyroidism  Is on 75 mcg of synthroid plASE TAKE YOUR THYROID MEDICATION FIRST THING IN THE MORNING WHILST FASTING.  NO MEDICATION/ FOOD FOR AN HOUR AFTER INGESTING THYROID PILLS.   HLD is on zetia and crestor HLD recheck FLP, check LFT's work on diet, SE of meds explained to pt. low fat and high fiber diet explained to pt.  Htn is on lopressor 25 mg daily. Continue current meds.  Medication compliance emphasised. pt advised to keep Bp logs. Pt verbalised understanding of the same. Pt to have a low salt diet . Exercise to reach a goal of at least 150 mins a week.  lifestyle modifications explained and pt understands importance of the above.  4. Insomnia : will start pt on melatonin  5.  Depression : Is on celexa and wellbutrin for such  Has been on this for a while now.  Consider stopping welbutrin ? Needs this  Says he isnt depressed any more  6. Afib : is on eliquis and lopressor Sees cards for such  severe three-vessel coronary artery disease diagnosed in 2008 as well as a large infrarenal abdominal aortic aneurysm. He underwent coronary artery bypass graft surgery in August 2008 at [facility]. He underwent open surgical repair of aortic aneurysm in October of 2008.     7l. Bil knee replacmeents noted on the exam stable, chorinc.  Problem List Items Addressed This Visit   None Visit Diagnoses     Screening for colon cancer    -  Primary   Relevant Orders   Ambulatory referral to Gastroenterology        Orders Placed This Encounter  Procedures   Ambulatory referral to  Gastroenterology     No orders of the defined types were placed in this encounter.    Follow up plan: No follow-ups on file.

## 2021-07-31 ENCOUNTER — Other Ambulatory Visit: Payer: Self-pay

## 2021-07-31 DIAGNOSIS — E782 Mixed hyperlipidemia: Secondary | ICD-10-CM

## 2021-07-31 DIAGNOSIS — I1310 Hypertensive heart and chronic kidney disease without heart failure, with stage 1 through stage 4 chronic kidney disease, or unspecified chronic kidney disease: Secondary | ICD-10-CM

## 2021-07-31 DIAGNOSIS — E039 Hypothyroidism, unspecified: Secondary | ICD-10-CM

## 2021-07-31 DIAGNOSIS — N401 Enlarged prostate with lower urinary tract symptoms: Secondary | ICD-10-CM

## 2021-07-31 DIAGNOSIS — N1831 Chronic kidney disease, stage 3a: Secondary | ICD-10-CM

## 2021-07-31 LAB — COMPREHENSIVE METABOLIC PANEL WITH GFR
ALT: 18 [IU]/L (ref 0–44)
AST: 32 [IU]/L (ref 0–40)
Albumin/Globulin Ratio: 1.7 (ref 1.2–2.2)
Albumin: 4 g/dL (ref 3.8–4.8)
Alkaline Phosphatase: 101 [IU]/L (ref 44–121)
BUN/Creatinine Ratio: 20 (ref 10–24)
BUN: 24 mg/dL (ref 8–27)
Bilirubin Total: 0.4 mg/dL (ref 0.0–1.2)
CO2: 20 mmol/L (ref 20–29)
Calcium: 9.3 mg/dL (ref 8.6–10.2)
Chloride: 104 mmol/L (ref 96–106)
Creatinine, Ser: 1.19 mg/dL (ref 0.76–1.27)
Globulin, Total: 2.4 g/dL (ref 1.5–4.5)
Glucose: 103 mg/dL — ABNORMAL HIGH (ref 65–99)
Potassium: 4.7 mmol/L (ref 3.5–5.2)
Sodium: 138 mmol/L (ref 134–144)
Total Protein: 6.4 g/dL (ref 6.0–8.5)
eGFR: 66 mL/min/{1.73_m2}

## 2021-07-31 LAB — CBC WITH DIFFERENTIAL/PLATELET
Basophils Absolute: 0 10*3/uL (ref 0.0–0.2)
Basos: 1 %
EOS (ABSOLUTE): 0.4 10*3/uL (ref 0.0–0.4)
Eos: 5 %
Hematocrit: 43.8 % (ref 37.5–51.0)
Hemoglobin: 14.6 g/dL (ref 13.0–17.7)
Immature Grans (Abs): 0 10*3/uL (ref 0.0–0.1)
Immature Granulocytes: 0 %
Lymphocytes Absolute: 4.1 10*3/uL — ABNORMAL HIGH (ref 0.7–3.1)
Lymphs: 52 %
MCH: 30.6 pg (ref 26.6–33.0)
MCHC: 33.3 g/dL (ref 31.5–35.7)
MCV: 92 fL (ref 79–97)
Monocytes Absolute: 0.5 10*3/uL (ref 0.1–0.9)
Monocytes: 7 %
Neutrophils Absolute: 2.7 10*3/uL (ref 1.4–7.0)
Neutrophils: 35 %
Platelets: 168 10*3/uL (ref 150–450)
RBC: 4.77 x10E6/uL (ref 4.14–5.80)
RDW: 13.5 % (ref 11.6–15.4)
WBC: 7.7 10*3/uL (ref 3.4–10.8)

## 2021-07-31 LAB — T4, FREE: Free T4: 1.17 ng/dL (ref 0.82–1.77)

## 2021-07-31 LAB — TSH: TSH: 4.07 u[IU]/mL (ref 0.450–4.500)

## 2021-08-02 ENCOUNTER — Other Ambulatory Visit: Payer: Self-pay

## 2021-08-02 ENCOUNTER — Other Ambulatory Visit
Admission: RE | Admit: 2021-08-02 | Discharge: 2021-08-02 | Disposition: A | Discharge status: Home / Self Care | Payer: Medicare HMO | Source: Ambulatory Visit | Attending: Vascular Surgery | Admitting: Vascular Surgery

## 2021-08-02 DIAGNOSIS — Z20822 Contact with and (suspected) exposure to covid-19: Secondary | ICD-10-CM | POA: Insufficient documentation

## 2021-08-02 DIAGNOSIS — Z01812 Encounter for preprocedural laboratory examination: Secondary | ICD-10-CM | POA: Insufficient documentation

## 2021-08-02 LAB — SARS CORONAVIRUS 2 (TAT 6-24 HRS): SARS Coronavirus 2: NEGATIVE

## 2021-08-04 ENCOUNTER — Ambulatory Visit: Payer: Medicare HMO

## 2021-08-04 ENCOUNTER — Encounter
Admission: RE | Disposition: A | Discharge status: Home / Self Care | Payer: Self-pay | Source: Home / Self Care | Attending: Vascular Surgery

## 2021-08-04 ENCOUNTER — Inpatient Hospital Stay
Admission: RE | Admit: 2021-08-04 | Discharge: 2021-08-05 | DRG: 271 | Disposition: A | Discharge status: Home / Self Care | Payer: Medicare HMO | Attending: Vascular Surgery | Admitting: Vascular Surgery

## 2021-08-04 ENCOUNTER — Encounter: Payer: Self-pay | Admitting: Vascular Surgery

## 2021-08-04 ENCOUNTER — Other Ambulatory Visit: Payer: Self-pay

## 2021-08-04 ENCOUNTER — Inpatient Hospital Stay: Payer: Medicare HMO | Admitting: Urgent Care

## 2021-08-04 DIAGNOSIS — Z79899 Other long term (current) drug therapy: Secondary | ICD-10-CM | POA: Diagnosis not present

## 2021-08-04 DIAGNOSIS — I714 Abdominal aortic aneurysm, without rupture: Secondary | ICD-10-CM | POA: Diagnosis present

## 2021-08-04 DIAGNOSIS — Z8616 Personal history of COVID-19: Secondary | ICD-10-CM

## 2021-08-04 DIAGNOSIS — N183 Chronic kidney disease, stage 3 unspecified: Secondary | ICD-10-CM | POA: Diagnosis present

## 2021-08-04 DIAGNOSIS — R911 Solitary pulmonary nodule: Secondary | ICD-10-CM | POA: Diagnosis present

## 2021-08-04 DIAGNOSIS — I2510 Atherosclerotic heart disease of native coronary artery without angina pectoris: Secondary | ICD-10-CM | POA: Diagnosis present

## 2021-08-04 DIAGNOSIS — Z87891 Personal history of nicotine dependence: Secondary | ICD-10-CM

## 2021-08-04 DIAGNOSIS — Z8249 Family history of ischemic heart disease and other diseases of the circulatory system: Secondary | ICD-10-CM

## 2021-08-04 DIAGNOSIS — Z96653 Presence of artificial knee joint, bilateral: Secondary | ICD-10-CM | POA: Diagnosis present

## 2021-08-04 DIAGNOSIS — Z951 Presence of aortocoronary bypass graft: Secondary | ICD-10-CM

## 2021-08-04 DIAGNOSIS — I129 Hypertensive chronic kidney disease with stage 1 through stage 4 chronic kidney disease, or unspecified chronic kidney disease: Secondary | ICD-10-CM | POA: Diagnosis present

## 2021-08-04 DIAGNOSIS — K219 Gastro-esophageal reflux disease without esophagitis: Secondary | ICD-10-CM | POA: Diagnosis present

## 2021-08-04 DIAGNOSIS — Z888 Allergy status to other drugs, medicaments and biological substances status: Secondary | ICD-10-CM

## 2021-08-04 DIAGNOSIS — Z818 Family history of other mental and behavioral disorders: Secondary | ICD-10-CM

## 2021-08-04 DIAGNOSIS — E039 Hypothyroidism, unspecified: Secondary | ICD-10-CM | POA: Diagnosis present

## 2021-08-04 DIAGNOSIS — Z79890 Hormone replacement therapy (postmenopausal): Secondary | ICD-10-CM | POA: Diagnosis not present

## 2021-08-04 DIAGNOSIS — E785 Hyperlipidemia, unspecified: Secondary | ICD-10-CM | POA: Diagnosis present

## 2021-08-04 DIAGNOSIS — Z885 Allergy status to narcotic agent status: Secondary | ICD-10-CM | POA: Diagnosis not present

## 2021-08-04 DIAGNOSIS — Z20822 Contact with and (suspected) exposure to covid-19: Secondary | ICD-10-CM | POA: Diagnosis present

## 2021-08-04 DIAGNOSIS — F32A Depression, unspecified: Secondary | ICD-10-CM | POA: Diagnosis present

## 2021-08-04 DIAGNOSIS — I723 Aneurysm of iliac artery: Secondary | ICD-10-CM | POA: Diagnosis present

## 2021-08-04 DIAGNOSIS — T82310A Breakdown (mechanical) of aortic (bifurcation) graft (replacement), initial encounter: Secondary | ICD-10-CM | POA: Diagnosis present

## 2021-08-04 DIAGNOSIS — Z7901 Long term (current) use of anticoagulants: Secondary | ICD-10-CM

## 2021-08-04 DIAGNOSIS — Y712 Prosthetic and other implants, materials and accessory cardiovascular devices associated with adverse incidents: Secondary | ICD-10-CM | POA: Diagnosis present

## 2021-08-04 DIAGNOSIS — I708 Atherosclerosis of other arteries: Secondary | ICD-10-CM | POA: Diagnosis present

## 2021-08-04 DIAGNOSIS — Z841 Family history of disorders of kidney and ureter: Secondary | ICD-10-CM

## 2021-08-04 HISTORY — DX: Long term (current) use of anticoagulants: Z79.01

## 2021-08-04 HISTORY — DX: Aneurysm of iliac artery: I72.3

## 2021-08-04 HISTORY — DX: Diverticulosis of large intestine without perforation or abscess without bleeding: K57.30

## 2021-08-04 HISTORY — PX: ENDOVASCULAR STENT GRAFT (AAA): CATH118280

## 2021-08-04 LAB — BASIC METABOLIC PANEL
Anion gap: 7 (ref 5–15)
BUN: 38 mg/dL — ABNORMAL HIGH (ref 8–23)
CO2: 26 mmol/L (ref 22–32)
Calcium: 9.1 mg/dL (ref 8.9–10.3)
Chloride: 107 mmol/L (ref 98–111)
Creatinine, Ser: 1.38 mg/dL — ABNORMAL HIGH (ref 0.61–1.24)
GFR, Estimated: 55 mL/min — ABNORMAL LOW (ref >60)
Glucose, Bld: 108 mg/dL — ABNORMAL HIGH (ref 70–99)
Potassium: 4.5 mmol/L (ref 3.5–5.1)
Sodium: 140 mmol/L (ref 135–145)

## 2021-08-04 LAB — CBC
HCT: 43.2 % (ref 39.0–52.0)
Hemoglobin: 14.1 g/dL (ref 13.0–17.0)
MCH: 30.3 pg (ref 26.0–34.0)
MCHC: 32.6 g/dL (ref 30.0–36.0)
MCV: 92.9 fL (ref 80.0–100.0)
Platelets: 145 10*3/uL — ABNORMAL LOW (ref 150–400)
RBC: 4.65 MIL/uL (ref 4.22–5.81)
RDW: 14 % (ref 11.5–15.5)
WBC: 9.3 10*3/uL (ref 4.0–10.5)
nRBC: 0 % (ref 0.0–0.2)

## 2021-08-04 LAB — MRSA NEXT GEN BY PCR, NASAL: MRSA by PCR Next Gen: NOT DETECTED

## 2021-08-04 LAB — GLUCOSE, CAPILLARY: Glucose-Capillary: 156 mg/dL — ABNORMAL HIGH (ref 70–99)

## 2021-08-04 SURGERY — ENDOVASCULAR STENT GRAFT (AAA)
Anesthesia: General

## 2021-08-04 MED ORDER — FAMOTIDINE 20 MG IN NS 100 ML IVPB
20.0000 mg | Freq: Two times a day (BID) | INTRAVENOUS | Status: DC
  Administered 2021-08-04 – 2021-08-05 (×2): 20 mg via INTRAVENOUS
  Filled 2021-08-04 (×2): qty 100

## 2021-08-04 MED ORDER — HEPARIN SODIUM (PORCINE) 1000 UNIT/ML IJ SOLN
INTRAMUSCULAR | Status: DC | PRN
  Administered 2021-08-04: 5000 [IU] via INTRAVENOUS

## 2021-08-04 MED ORDER — DOCUSATE SODIUM 100 MG PO CAPS
100.0000 mg | ORAL_CAPSULE | Freq: Every day | ORAL | Status: DC
  Administered 2021-08-05: 100 mg via ORAL
  Filled 2021-08-04: qty 1

## 2021-08-04 MED ORDER — SUGAMMADEX SODIUM 500 MG/5ML IV SOLN
INTRAVENOUS | Status: DC | PRN
  Administered 2021-08-04: 200 mg via INTRAVENOUS

## 2021-08-04 MED ORDER — METOPROLOL TARTRATE 25 MG PO TABS
12.5000 mg | ORAL_TABLET | Freq: Two times a day (BID) | ORAL | Status: DC
  Administered 2021-08-04 – 2021-08-05 (×2): 12.5 mg via ORAL
  Filled 2021-08-04 (×2): qty 1

## 2021-08-04 MED ORDER — ACETAMINOPHEN 325 MG PO TABS
325.0000 mg | ORAL_TABLET | ORAL | Status: DC | PRN
  Administered 2021-08-04: 650 mg via ORAL
  Filled 2021-08-04: qty 2

## 2021-08-04 MED ORDER — EPHEDRINE SULFATE 50 MG/ML IJ SOLN
INTRAMUSCULAR | Status: DC | PRN
  Administered 2021-08-04: 5 mg via INTRAVENOUS
  Administered 2021-08-04 (×2): 10 mg via INTRAVENOUS

## 2021-08-04 MED ORDER — PROPOFOL 10 MG/ML IV BOLUS
INTRAVENOUS | Status: AC
  Filled 2021-08-04: qty 40

## 2021-08-04 MED ORDER — PHENYLEPHRINE HCL (PRESSORS) 10 MG/ML IV SOLN
INTRAVENOUS | Status: AC
  Filled 2021-08-04: qty 1

## 2021-08-04 MED ORDER — VITAMIN D3 25 MCG (1000 UNIT) PO TABS
2000.0000 [IU] | ORAL_TABLET | Freq: Every day | ORAL | Status: DC
  Administered 2021-08-05: 2000 [IU] via ORAL
  Filled 2021-08-04 (×2): qty 2

## 2021-08-04 MED ORDER — PROPOFOL 10 MG/ML IV BOLUS
INTRAVENOUS | Status: DC | PRN
  Administered 2021-08-04: 20 mg via INTRAVENOUS
  Administered 2021-08-04: 100 mg via INTRAVENOUS

## 2021-08-04 MED ORDER — MORPHINE SULFATE (PF) 2 MG/ML IV SOLN: 2.0000 mg | INTRAVENOUS | Status: DC | PRN

## 2021-08-04 MED ORDER — LIDOCAINE HCL (PF) 2 % IJ SOLN
INTRAMUSCULAR | Status: AC
  Filled 2021-08-04: qty 5

## 2021-08-04 MED ORDER — CITALOPRAM HYDROBROMIDE 20 MG PO TABS
20.0000 mg | ORAL_TABLET | Freq: Every day | ORAL | Status: DC
  Administered 2021-08-05: 20 mg via ORAL
  Filled 2021-08-04: qty 1

## 2021-08-04 MED ORDER — CEFAZOLIN SODIUM-DEXTROSE 2-4 GM/100ML-% IV SOLN
2.0000 g | Freq: Three times a day (TID) | INTRAVENOUS | Status: AC
  Administered 2021-08-04 (×2): 2 g via INTRAVENOUS
  Filled 2021-08-04: qty 100

## 2021-08-04 MED ORDER — ONDANSETRON HCL 4 MG/2ML IJ SOLN
4.0000 mg | Freq: Four times a day (QID) | INTRAMUSCULAR | Status: DC | PRN
  Administered 2021-08-04: 4 mg via INTRAVENOUS

## 2021-08-04 MED ORDER — IODIXANOL 320 MG/ML IV SOLN
INTRAVENOUS | Status: DC | PRN
  Administered 2021-08-04: 55 mL via INTRA_ARTERIAL

## 2021-08-04 MED ORDER — DOPAMINE-DEXTROSE 3.2-5 MG/ML-% IV SOLN: 3.0000 ug/kg/min | INTRAVENOUS | Status: DC

## 2021-08-04 MED ORDER — ACETAMINOPHEN 10 MG/ML IV SOLN: 1000.0000 mg | Freq: Once | INTRAVENOUS | Status: DC | PRN

## 2021-08-04 MED ORDER — FENTANYL CITRATE (PF) 100 MCG/2ML IJ SOLN
INTRAMUSCULAR | Status: AC
  Administered 2021-08-04: 25 ug via INTRAVENOUS
  Filled 2021-08-04: qty 2

## 2021-08-04 MED ORDER — HEPARIN SODIUM (PORCINE) 1000 UNIT/ML IJ SOLN
INTRAMUSCULAR | Status: AC
  Filled 2021-08-04: qty 1

## 2021-08-04 MED ORDER — EZETIMIBE 10 MG PO TABS
10.0000 mg | ORAL_TABLET | Freq: Every day | ORAL | Status: DC
  Administered 2021-08-05: 10 mg via ORAL
  Filled 2021-08-04: qty 1

## 2021-08-04 MED ORDER — SODIUM CHLORIDE 0.9 % IV SOLN
500.0000 mL | Freq: Once | INTRAVENOUS | Status: DC | PRN
Start: 2021-08-04 — End: 2021-08-05

## 2021-08-04 MED ORDER — ROCURONIUM BROMIDE 10 MG/ML (PF) SYRINGE
PREFILLED_SYRINGE | INTRAVENOUS | Status: AC
  Filled 2021-08-04: qty 10

## 2021-08-04 MED ORDER — APIXABAN 5 MG PO TABS
5.0000 mg | ORAL_TABLET | Freq: Two times a day (BID) | ORAL | Status: DC
  Administered 2021-08-05: 5 mg via ORAL
  Filled 2021-08-04: qty 1

## 2021-08-04 MED ORDER — CHLORHEXIDINE GLUCONATE CLOTH 2 % EX PADS
6.0000 | MEDICATED_PAD | Freq: Every day | CUTANEOUS | Status: DC
  Administered 2021-08-04: 6 via TOPICAL

## 2021-08-04 MED ORDER — OXYCODONE-ACETAMINOPHEN 5-325 MG PO TABS: 1.0000 | ORAL_TABLET | ORAL | Status: DC | PRN

## 2021-08-04 MED ORDER — POTASSIUM CHLORIDE CRYS ER 20 MEQ PO TBCR: 20.0000 meq | EXTENDED_RELEASE_TABLET | Freq: Every day | ORAL | Status: DC | PRN

## 2021-08-04 MED ORDER — FENTANYL CITRATE PF 50 MCG/ML IJ SOSY: 25.0000 ug | PREFILLED_SYRINGE | INTRAMUSCULAR | Status: DC | PRN

## 2021-08-04 MED ORDER — ASPIRIN EC 81 MG PO TBEC
81.0000 mg | DELAYED_RELEASE_TABLET | Freq: Every day | ORAL | Status: DC
  Administered 2021-08-05: 81 mg via ORAL
  Filled 2021-08-04: qty 1

## 2021-08-04 MED ORDER — ROCURONIUM BROMIDE 100 MG/10ML IV SOLN
INTRAVENOUS | Status: DC | PRN
  Administered 2021-08-04: 50 mg via INTRAVENOUS
  Administered 2021-08-04: 10 mg via INTRAVENOUS
  Administered 2021-08-04: 20 mg via INTRAVENOUS

## 2021-08-04 MED ORDER — LIDOCAINE HCL (CARDIAC) PF 100 MG/5ML IV SOSY
PREFILLED_SYRINGE | INTRAVENOUS | Status: DC | PRN
  Administered 2021-08-04: 100 mg via INTRAVENOUS

## 2021-08-04 MED ORDER — LABETALOL HCL 5 MG/ML IV SOLN
10.0000 mg | INTRAVENOUS | Status: DC | PRN
Start: 2021-08-04 — End: 2021-08-05

## 2021-08-04 MED ORDER — DEXAMETHASONE SODIUM PHOSPHATE 10 MG/ML IJ SOLN
INTRAMUSCULAR | Status: AC
  Filled 2021-08-04: qty 1

## 2021-08-04 MED ORDER — SODIUM CHLORIDE 0.9 % IV SOLN: INTRAVENOUS | Status: DC

## 2021-08-04 MED ORDER — HYDRALAZINE HCL 20 MG/ML IJ SOLN: 5.0000 mg | INTRAMUSCULAR | Status: DC | PRN

## 2021-08-04 MED ORDER — ROSUVASTATIN CALCIUM 10 MG PO TABS
40.0000 mg | ORAL_TABLET | Freq: Every day | ORAL | Status: DC
  Administered 2021-08-05: 40 mg via ORAL
  Filled 2021-08-04: qty 4

## 2021-08-04 MED ORDER — MAGNESIUM SULFATE 2 GM/50ML IV SOLN
2.0000 g | Freq: Every day | INTRAVENOUS | Status: DC | PRN
  Filled 2021-08-04: qty 50

## 2021-08-04 MED ORDER — FAMOTIDINE 20 MG PO TABS: 20.0000 mg | ORAL_TABLET | Freq: Once | ORAL | Status: AC

## 2021-08-04 MED ORDER — PROPOFOL 10 MG/ML IV BOLUS
INTRAVENOUS | Status: AC
  Filled 2021-08-04: qty 20

## 2021-08-04 MED ORDER — PHENOL 1.4 % MT LIQD
1.0000 | OROMUCOSAL | Status: DC | PRN
  Filled 2021-08-04: qty 177

## 2021-08-04 MED ORDER — DEXTROSE 5 % IV SOLN: 2.0000 g | Freq: Once | INTRAVENOUS | Status: DC

## 2021-08-04 MED ORDER — ONDANSETRON HCL 4 MG/2ML IJ SOLN: 4.0000 mg | Freq: Four times a day (QID) | INTRAMUSCULAR | Status: DC | PRN

## 2021-08-04 MED ORDER — FENTANYL CITRATE (PF) 100 MCG/2ML IJ SOLN
INTRAMUSCULAR | Status: DC | PRN
  Administered 2021-08-04 (×2): 25 ug via INTRAVENOUS
  Administered 2021-08-04: 50 ug via INTRAVENOUS

## 2021-08-04 MED ORDER — CEFAZOLIN SODIUM-DEXTROSE 2-4 GM/100ML-% IV SOLN
INTRAVENOUS | Status: AC
  Filled 2021-08-04: qty 100

## 2021-08-04 MED ORDER — BUPROPION HCL ER (SR) 100 MG PO TB12
200.0000 mg | ORAL_TABLET | Freq: Two times a day (BID) | ORAL | Status: DC
  Administered 2021-08-04 – 2021-08-05 (×2): 200 mg via ORAL
  Filled 2021-08-04 (×3): qty 2

## 2021-08-04 MED ORDER — ALUM & MAG HYDROXIDE-SIMETH 200-200-20 MG/5ML PO SUSP: 15.0000 mL | ORAL | Status: DC | PRN

## 2021-08-04 MED ORDER — MELATONIN 5 MG PO TABS
2.5000 mg | ORAL_TABLET | Freq: Every day | ORAL | Status: DC
  Administered 2021-08-04: 2.5 mg via ORAL
  Filled 2021-08-04: qty 1

## 2021-08-04 MED ORDER — ONDANSETRON HCL 4 MG/2ML IJ SOLN
INTRAMUSCULAR | Status: AC
  Filled 2021-08-04: qty 2

## 2021-08-04 MED ORDER — FENTANYL CITRATE (PF) 100 MCG/2ML IJ SOLN
25.0000 ug | INTRAMUSCULAR | Status: DC | PRN
  Administered 2021-08-04: 25 ug via INTRAVENOUS

## 2021-08-04 MED ORDER — HYDROMORPHONE HCL 1 MG/ML IJ SOLN: 1.0000 mg | Freq: Once | INTRAMUSCULAR | Status: DC | PRN

## 2021-08-04 MED ORDER — DEXAMETHASONE SODIUM PHOSPHATE 10 MG/ML IJ SOLN
INTRAMUSCULAR | Status: DC | PRN
  Administered 2021-08-04: 10 mg via INTRAVENOUS

## 2021-08-04 MED ORDER — PHENYLEPHRINE HCL (PRESSORS) 10 MG/ML IV SOLN
INTRAVENOUS | Status: DC | PRN
  Administered 2021-08-04: 100 ug via INTRAVENOUS
  Administered 2021-08-04: 200 ug via INTRAVENOUS
  Administered 2021-08-04 (×2): 100 ug via INTRAVENOUS

## 2021-08-04 MED ORDER — ACETAMINOPHEN 650 MG RE SUPP: 325.0000 mg | RECTAL | Status: DC | PRN

## 2021-08-04 MED ORDER — FENTANYL CITRATE (PF) 100 MCG/2ML IJ SOLN
INTRAMUSCULAR | Status: AC
  Filled 2021-08-04: qty 2

## 2021-08-04 MED ORDER — LEVOTHYROXINE SODIUM 50 MCG PO TABS
75.0000 ug | ORAL_TABLET | Freq: Every day | ORAL | Status: DC
  Administered 2021-08-05: 75 ug via ORAL
  Filled 2021-08-04: qty 2

## 2021-08-04 MED ORDER — GUAIFENESIN-DM 100-10 MG/5ML PO SYRP: 15.0000 mL | ORAL_SOLUTION | ORAL | Status: DC | PRN

## 2021-08-04 MED ORDER — CEFAZOLIN SODIUM-DEXTROSE 2-4 GM/100ML-% IV SOLN
2.0000 g | Freq: Once | INTRAVENOUS | Status: AC
  Administered 2021-08-04: 2 g via INTRAVENOUS

## 2021-08-04 MED ORDER — SODIUM CHLORIDE 0.9 % IV SOLN
INTRAVENOUS | Status: DC | PRN
  Administered 2021-08-04: 20 ug/min via INTRAVENOUS

## 2021-08-04 MED ORDER — FAMOTIDINE 20 MG PO TABS
ORAL_TABLET | ORAL | Status: AC
  Administered 2021-08-04: 20 mg via ORAL
  Filled 2021-08-04: qty 1

## 2021-08-04 MED ORDER — SUGAMMADEX SODIUM 500 MG/5ML IV SOLN
INTRAVENOUS | Status: AC
  Filled 2021-08-04: qty 5

## 2021-08-04 MED ORDER — PANTOPRAZOLE SODIUM 40 MG PO TBEC
40.0000 mg | DELAYED_RELEASE_TABLET | Freq: Every day | ORAL | Status: DC
  Administered 2021-08-05: 40 mg via ORAL
  Filled 2021-08-04: qty 1

## 2021-08-04 MED ORDER — METOPROLOL TARTRATE 5 MG/5ML IV SOLN: 2.0000 mg | INTRAVENOUS | Status: DC | PRN

## 2021-08-04 MED ORDER — ONDANSETRON HCL 4 MG/2ML IJ SOLN: 4.0000 mg | Freq: Once | INTRAMUSCULAR | Status: DC | PRN

## 2021-08-04 MED ORDER — LACTATED RINGERS IV SOLN: INTRAVENOUS | Status: DC

## 2021-08-04 MED ORDER — NITROGLYCERIN IN D5W 200-5 MCG/ML-% IV SOLN
5.0000 ug/min | INTRAVENOUS | Status: DC
Start: 2021-08-04 — End: 2021-08-05

## 2021-08-04 SURGICAL SUPPLY — 29 items
CANNULA 5F STIFF (CANNULA) ×1 IMPLANT
CATH ACCU-VU SIZ PIG 5F 70CM (CATHETERS) ×1 IMPLANT
CATH BALLN CODA 9X100X32 (BALLOONS) ×1 IMPLANT
CATH BEACON 5 .035 65 KMP TIP (CATHETERS) ×1 IMPLANT
CATH MICROCATH PRGRT 2.8F 130 (MICROCATHETER) IMPLANT
CATH TEMPO 5F RIM 65CM (CATHETERS) ×1 IMPLANT
CATH VS15FR (CATHETERS) ×1 IMPLANT
CLOSURE PERCLOSE PROSTYLE (VASCULAR PRODUCTS) ×2 IMPLANT
COIL 400 COMPLEX SOFT 16X50CM (Vascular Products) ×2 IMPLANT
COIL 400 COMPLEX SOFT 20X60CM (Vascular Products) ×1 IMPLANT
COVER DRAPE FLUORO 36X44 (DRAPES) ×2 IMPLANT
COVER PROBE U/S 5X48 (MISCELLANEOUS) ×1 IMPLANT
DEVICE OCCLUSION PODJ60 (Vascular Products) IMPLANT
DEVICE TORQUE .025-.038 (MISCELLANEOUS) ×1 IMPLANT
DRYSEAL FLEXSHEATH 12FR 33CM (SHEATH) ×1
GLIDEWIRE STIFF .35X180X3 HYDR (WIRE) ×1 IMPLANT
HANDLE DETACHMENT COIL (MISCELLANEOUS) ×1 IMPLANT
LEG CONTRALATERAL 16X14.5X12 (Vascular Products) ×1 IMPLANT
LEG CONTRALATERAL 16X14.5X14 (Vascular Products) ×1 IMPLANT
MICROCATH PROGREAT 2.8F 130CM (MICROCATHETER) ×2
OCCLUSION DEVICE PODJ60 (Vascular Products) ×2 IMPLANT
PACK ANGIOGRAPHY (CUSTOM PROCEDURE TRAY) ×2 IMPLANT
SHEATH BRITE TIP 6FRX11 (SHEATH) ×2 IMPLANT
SHEATH BRITE TIP 8FRX11 (SHEATH) ×2 IMPLANT
SHEATH DRYSEAL FLEX 12FR 33CM (SHEATH) IMPLANT
SPONGE XRAY 4X4 16PLY STRL (MISCELLANEOUS) ×3 IMPLANT
TUBING CONTRAST HIGH PRESS 72 (TUBING) ×1 IMPLANT
WIRE AMPLATZ SSTIFF .035X260CM (WIRE) ×2 IMPLANT
WIRE GUIDERIGHT .035X150 (WIRE) ×2 IMPLANT

## 2021-08-04 NOTE — Op Note (Addendum)
OPERATIVE NOTE   PROCEDURE: Ultrasound-guided access to the right common femoral artery. Catheter placement third-order into the right hypogastric artery from the right femoral approach. Selective right hypogastric artery angiography. Coil embolization of the right hypogastric artery using Ruby coils as described below. Repair of right common iliac artery aneurysm with a 14 x 14 Gore iliac extender limb in association with a 14 x 12 Gore iliac extender limb.  PRE-OPERATIVE DIAGNOSIS: Abdominal aortic aneurysm with history of stent graft repair and now progression of the right common iliac artery aneurysm to 3 cm in diameter and associated with a type Ib endoleak and and a right internal iliac artery aneurysm.  POST-OPERATIVE DIAGNOSIS: Same  CO-SURGEON: Algernon Huxley, MD and Hortencia Pilar, MD  ASSISTANT(S): None  ANESTHESIA: general  ESTIMATED BLOOD LOSS: 10 cc  FINDING(S): Large right common iliac artery aneurysm associated with a smaller right internal iliac artery aneurysm  SPECIMEN(S): None  INDICATIONS:   Stuart Harvey is a 70 y.o. male who presents with increasing right common iliac artery aneurysm with apparent type Ib endoleak from around the distal aspect of the right limb of his stent graft.  This puts him at risk for lethal rupture.  Preoperative evaluation demonstrates he is a candidate for endovascular repair but will require coil embolization of his right internal iliac artery aneurysm and then extension into the right external iliac artery with a stent graft limb.  Risks and benefits of been reviewed with the patient all questions been answered alternative therapies were discussed.  Patient wishes to proceed with stent graft repair.  Soft  DESCRIPTION: After full informed written consent was obtained from the patient, the patient was brought back to the operating room and placed supine upon the operating table.  Prior to induction, the patient received IV  antibiotics.   After obtaining adequate anesthesia, the patient was then prepped and draped in the standard fashion for a endovascular repair.  Co-surgeons are required because this is a complex procedure with work being performed simultaneously from the right femoral femoral approach.  This also expedites the procedure making a shorter operative time reducing complications and improving patient safety.  We then began by gaining access to the right femoral arteries with US guidance with me working on the left and Dr. Lucky Cowboy working on the right.  The right femoral artery was found to be patent and accessed without difficulty with a Seldinger needle under ultrasound guidance without difficulty and permanent images were recorded.  We then placed 2 proglide devices on right side in a pre-close fashion and placed 8 French sheath.  The patient was then given  5000 units of intravenous heparin.   Using a combination of the V S1 catheter with a stiff angled Glidewire we were able to select the right internal iliac artery.  Hand-injection contrast demonstrated the internal iliac anatomy in detail and verified intraluminal positioning.  A prograde catheter was then advanced through the V S1 catheter and a total of 4 Ruby coils were deployed.  I first assisted with this portion maintaining appropriate tension on the catheter as well as stabilizing the prograde and assisting with wire control.  A 16 mm x 50 mm soft coil was deployed followed by a 60 cm packing coil and then a 60 mm x 50 cm soft coil and lastly a 20 mm x 60 cm soft coil.  Follow-up imaging demonstrated excellent placement with occlusion of the internal iliac artery.  Having successfully embolized the right  internal iliac we then proceeded with stent graft placement.  The VS 1 was pulled back into the external iliac artery over a wire and then an Amplatz Super Stiff wire was advanced.  VS 1 was then removed and a Kumpe catheter was used in combination  with the Amplatz to negotiate the wire into the descending thoracic aorta.  The 8 French sheath was then upsized to a 12 [place] sheath.  The first extension limb we selected was a 14 x 14 iliac extender limb.  Once this was deployed a 14 x 12 iliac extender limb was deployed overlapping the first limb by approximately 6 cm and extending into the right external iliac artery by approximately 4 to 5 cm.  The limb was postdilated with a Coda balloon sealing all junction points.  Pigtail catheter was then advanced into the main body and bolus injection of contrast was utilized to image the entire stent graft.  No endoleak's were noted there is complete seal in the right common iliac artery aneurysm has now been completely excluded and successfully treated.  The 12 French sheath was then removed and the Pro-glide devices deployed with excellent hemostasis.  The skin was then closed with a 4-0 Monocryl subcuticular and then Dermabond.  Safeguard device was applied.  Patient was taken to recovery room in stable condition.   The patient tolerated this procedure well.   COMPLICATIONS: None  CONDITION: Stuart Harvey [facility]  Office: [phone]   08/04/2021, 9:47 AM

## 2021-08-04 NOTE — Interval H&P Note (Signed)
History and Physical Interval Note:  08/04/2021 7:59 AM  Stuart Harvey  has presented today for surgery, with the diagnosis of AAA   Iliac Aneurysm Repair   ANESTHESIA   GORE Rep  cc: [name].  The various methods of treatment have been discussed with the patient and family. After consideration of risks, benefits and other options for treatment, the patient has consented to  Procedure(s): ENDOVASCULAR REPAIR/STENT GRAFT (N/A) as a surgical intervention.  The patient's history has been reviewed, patient examined, no change in status, stable for surgery.  I have reviewed the patient's chart and labs.  Questions were answered to the patient's satisfaction.     Leotis Pain

## 2021-08-04 NOTE — Anesthesia Procedure Notes (Signed)
Procedure Name: Intubation Date/Time: 08/04/2021 8:23 AM Performed by: Debe Coder, CRNA Pre-anesthesia Checklist: Patient identified, Emergency Drugs available, Suction available and Patient being monitored Patient Re-evaluated:Patient Re-evaluated prior to induction Oxygen Delivery Method: Circle system utilized Preoxygenation: Pre-oxygenation with 100% oxygen Induction Type: IV induction Ventilation: Mask ventilation without difficulty Laryngoscope Size: McGraph and 3 Grade View: Grade I Tube type: Oral Tube size: 7.0 mm Number of attempts: 1 Airway Equipment and Method: Stylet and Oral airway Placement Confirmation: ETT inserted through vocal cords under direct vision, positive ETCO2 and breath sounds checked- equal and bilateral Secured at: 21 cm Tube secured with: Tape Dental Injury: Teeth and Oropharynx as per pre-operative assessment

## 2021-08-04 NOTE — Transfer of Care (Signed)
Immediate Anesthesia Transfer of Care Note  Patient: Stuart Harvey  Procedure(s) Performed: ENDOVASCULAR REPAIR/STENT GRAFT  Patient Location: PACU  Anesthesia Type:General  Level of Consciousness: drowsy and patient cooperative  Airway & Oxygen Therapy: Patient Spontanous Breathing and Patient connected to nasal cannula oxygen  Post-op Assessment: Report given to RN and Post -op Vital signs reviewed and stable  Post vital signs: Reviewed and stable  Last Vitals:  Vitals Value Taken Time  BP 115/80 08/04/21 1100  Temp 36.4 C 08/04/21 0953  Pulse 54 08/04/21 1104  Resp 12 08/04/21 1104  SpO2 94 % 08/04/21 1104  Vitals shown include unvalidated device data.  Last Pain:  Vitals:   08/04/21 1045  TempSrc:   PainSc: 5          Complications: No notable events documented.

## 2021-08-04 NOTE — Anesthesia Preprocedure Evaluation (Addendum)
Anesthesia Evaluation  Patient identified by MRN, date of birth, ID band Patient awake    Reviewed: Allergy & Precautions, NPO status , Patient's Chart, lab work & pertinent test results  History of Anesthesia Complications Negative for: history of anesthetic complications  Airway Mallampati: II  TM Distance: >3 FB Neck ROM: Full    Dental no notable dental hx. (+) Teeth Intact   Pulmonary neg pulmonary ROS, neg sleep apnea, neg COPD, Patient abstained from smoking.Not current smoker, former smoker,    Pulmonary exam normal breath sounds clear to auscultation       Cardiovascular Exercise Tolerance: Good METShypertension, + CAD, + CABG and + Peripheral Vascular Disease  (-) Past MI (-) dysrhythmias  Rhythm:Regular Rate:Normal - Systolic murmurs S/p EVAR and prior repairs. New T wave inversions in anterior leads on preop EKG; per cardiologist, no further workup necessary given no new symptoms.   TTE performed on 06/03/2018 revealed a mildly dilated left ventricle.  Left ventricular systolic function normal with an EF of 50-55%.  Ascending aorta moderately dilated at 4.4 cm.  There was mild to moderate dilatation of the left atrium.   Coronary/graft angiography study was performed on 06/05/2018 revealing severe CAD with subtotal/total occlusions of the proximal-mid LAD, proximal LCx, and proximal RCA.  LIMA-LAD, SVG-D2, and SVG-RPDA bypass grafts widely patent.  SVG-OM 3 bypass graft patent but diffusely diseased with focal 60% and 80% stenoses and the middle of the graft.  Further intervention deferred opting for medical management and consideration of future PCI.   Neuro/Psych PSYCHIATRIC DISORDERS Depression negative neurological ROS     GI/Hepatic GERD  ,(+)     (-) substance abuse  ,   Endo/Other  neg diabetesHypothyroidism   Renal/GU CRFRenal disease     Musculoskeletal  (+) Arthritis ,   Abdominal   Peds   Hematology   Anesthesia Other Findings Past Medical History: No date: AAA (abdominal aortic aneurysm) (HCC)     Comment:  a.) s/p EVAR in 08/2007 by Dr. Hulda Humphrey. b.) CTA on               06/30/2021 --> interval aneurysmal dilitation superior to              stent graft with the juxtarenal aorta measuring 3.8 cm. No date: Anemia No date: Arthritis     Comment:  both knees No date: Chronic anticoagulation     Comment:  Apixaban No date: CKD (chronic kidney disease), stage II No date: Coronary artery disease     Comment:  a.) 4v CABG @ [place] 06/2007 (LIMA-LAD, VG-RPDA,               VG-OM3,VG-D2); b.) LHC 2011: patent grafts EF 40%, c.)               LHC 06/05/18: LM nl, pLAD 50%, p-mLAD 50%, ostD1 70%, ost               ramus 30%, ost-pLCx 40%, pLCx 100% (chronically               occluded), OM3 100%, p-dRCA 100% (chronically occluded),               LIMA-LAD patent, VG-RPDA patent, VG-OM3 mid-graft 60% and              80% No date: Depression No date: GERD (gastroesophageal reflux disease) No date: Hernia of abdominal cavity 09/2019: History of 2019 novel coronavirus disease (COVID-19) No date: Hyperlipidemia No date: Hypertension No  date: Hypothyroidism No date: Iliac artery aneurysm, right (HCC)     Comment:  a.) at the distal landing zone of the right iliac limb               measuring 3 cm 06/30/2021: Right middle lobe pulmonary nodule     Comment:  a.) measured 8 mm by CT on 06/30/2021. 06/25/2007: S/P CABG x 4     Comment:  a.) LIMA-LAD, SVG-RPDA, SVG-OM3, SVG-D2 No date: Sigmoid diverticulosis 05/2018: Ventricular tachycardia ([place])     Comment:  a.) found unresponsive by EMS; VT noted -->               defibrillation achieved ROSC. Briefly intubated. Episode               felt to be secondary to heat stoke.  Reproductive/Obstetrics                            Anesthesia Physical Anesthesia Plan  ASA: 3  Anesthesia Plan: General   Post-op Pain  Management:    Induction: Intravenous  PONV Risk Score and Plan: 2 and Ondansetron, Dexamethasone and Treatment may vary due to age or medical condition  Airway Management Planned: Oral ETT  Additional Equipment: Arterial line  Intra-op Plan:   Post-operative Plan: Extubation in OR  Informed Consent: I have reviewed the patients History and Physical, chart, labs and discussed the procedure including the risks, benefits and alternatives for the proposed anesthesia with the patient or authorized representative who has indicated his/her understanding and acceptance.     Dental advisory given  Plan Discussed with: CRNA and Surgeon  Anesthesia Plan Comments: (Discussed risks of anesthesia with patient, including PONV, sore throat, lip/dental damage. Rare risks discussed as well, such as cardiorespiratory and neurological sequelae, and allergic reactions. Agrees to blood transfusion if necessary. Will transduce arterial pressures off surgeon's side port. Patient understands.)        Anesthesia Quick Evaluation

## 2021-08-04 NOTE — Anesthesia Postprocedure Evaluation (Signed)
Anesthesia Post Note  Patient: Stuart Harvey  Procedure(s) Performed: ENDOVASCULAR REPAIR/STENT GRAFT  Patient location during evaluation: PACU Anesthesia Type: General Level of consciousness: awake and alert Pain management: pain level controlled Vital Signs Assessment: post-procedure vital signs reviewed and stable Respiratory status: spontaneous breathing, nonlabored ventilation, respiratory function stable and patient connected to nasal cannula oxygen Cardiovascular status: blood pressure returned to baseline and stable Postop Assessment: no apparent nausea or vomiting Anesthetic complications: no   No notable events documented.   Last Vitals:  Vitals:   08/04/21 1045 08/04/21 1100  BP: 118/72 115/80  Pulse: (!) 55 (!) 55  Resp: 15 15  Temp:    SpO2: 92% 95%    Last Pain:  Vitals:   08/04/21 1045  TempSrc:   PainSc: 5                  Arita Miss

## 2021-08-04 NOTE — Op Note (Signed)
[facility]   OPERATIVE NOTE  DATE: 08/04/2021  PRE-OPERATIVE DIAGNOSIS: Abdominal aortic aneurysm status postrepair with progression of right iliac artery aneurysm now 3 cm in diameter  POST-OPERATIVE DIAGNOSIS: same as above  PROCEDURE: 1.   Ultrasound guidance for vascular access right femoral artery 2.  Catheter placement into right hypogastric artery from right femoral approach 3.  Right hypogastric artery angiogram 4.  Coil embolization of the right hypogastric artery with a total of 4 Ruby coils 5.  Repair of right iliac artery aneurysm with 2 right iliac artery extension limbs starting at the flow divider of the previous stent graft with a 14 mm diameter by 14 cm length Gore iliac limb and a 14 mm diameter by 12 cm length Gore iliac limb  SURGEONS: Leotis Pain, MD and Hortencia Pilar, MD  ASSISTANT(S): None  ANESTHESIA: general  ESTIMATED BLOOD LOSS: 10 cc  FINDING(S): 1.  Right common iliac artery aneurysm with type Ib endoleak from previous aneurysm repair secondary to aneurysmal degeneration of the right common iliac artery to 3 cm.  There is also ectasia or mild aneurysmal degeneration of the right internal iliac artery.   INDICATIONS:   Stuart Harvey is a 70 y.o. male who presents with right common iliac artery aneurysm progression now up to 3 cm creating a type Ib endoleak from the previous aneurysm repair stent graft.  This needs to be treated and the most appropriate way to treat this will be to coil embolize the right hypogastric artery and extend the stent graft down to the right external iliac artery.  Risks and benefits were discussed and informed consent was obtained.  DESCRIPTION: After obtaining full informed written consent, the patient was brought back to the operating room and placed supine upon the operating table.  The patient was prepped and draped in the standard fashion.  With myself working on the right and Dr. Delana Meyer working on  the left, we began the procedure.  Co-surgeon's were used to help expedite the procedure.  Ultrasound was used to visualize the right femoral artery which was patent although somewhat diseased.  It was then accessed under direct ultrasound guidance without difficulty with a Seldinger needle and a permanent image was recorded.  We started with a 5 French sheath and then placed 2 ProGlide devices on each side and then upsized to an 8 [place] sheath.  Imaging through the sheath and a steep LAO and caudal projection helped find the origin of the right hypogastric artery.  This was then cannulated with the V S1 catheter and a Glidewire with a mild amount of difficulty due to the significant angulation and tortuosity of the iliac vessels.  Once the V S1 catheter was parked in the proximal right internal iliac artery, the wire was removed and selective imaging showed some ectasia or aneurysmal degeneration of the right internal iliac artery measuring 13 to 14 mm itself.  A progreat microcatheter was then placed down to the primary branches of the internal iliac artery we began coil embolization at this point.  The first coil was a 16 mm diameter by 50 cm soft coil.  A 60 cm packing coil was then placed.  We then built back with another 16 mm diameter by 50 cm soft coil and then a 20 mm diameter by 60 cm soft coil was the final coil placed.  This densely packed the right internal iliac artery and successful embolization was seen with removal of the microcatheter and imaging  through the V S1 catheter.  The V S1 catheter was then removed and a Kumpe catheter and an Amplatz wire were used to get up into the main body portion of the previous stent graft in the aorta.  We then upsized to a 12 [place] sheath.  Imaging showed that we would need to begin up near the flow divider of the previous stent graft due to the bell bottom nature of the previous right iliac limb and build down to the right external iliac artery.  This required  a 14 mm diameter by 14 cm length iliac limb initially up to the flow divider and then a 14 mm diameter by 12 cm length iliac limb was brought down about 4 to 5 cm into the right external iliac artery.  Junction points and seals zones were then treated with the compliant balloon.  Completion imaging showed excellent flow through the stent graft both an antegrade and retrograde fashion with successful coil embolization of the right hypogastric artery and no obvious endoleak seen.  Of note, there was some stenosis of the left internal iliac artery that could be seen and if he has significant pelvic symptoms following this procedure, procedure another day to consider intervention for his left internal iliac artery stenosis will be given.  We then remove the 12 French sheath and secured the Pro-glide devices in the right femoral artery with excellent hemostatic result.  The skin was then closed with a 4-0 Monocryl and Dermabond was placed as a dressing.  We elected to complete the procedure.  The patient was taken to the recovery room in stable condition.   COMPLICATIONS: None  CONDITION: Stable  Leotis Pain  08/04/2021, 9:55 AM    This note was created with Dragon Medical transcription system. Any errors in dictation are purely unintentional.

## 2021-08-05 ENCOUNTER — Encounter: Payer: Self-pay | Admitting: Vascular Surgery

## 2021-08-05 ENCOUNTER — Ambulatory Visit: Payer: Medicare HMO | Admitting: Internal Medicine

## 2021-08-05 DIAGNOSIS — Y712 Prosthetic and other implants, materials and accessory cardiovascular devices associated with adverse incidents: Secondary | ICD-10-CM

## 2021-08-05 DIAGNOSIS — I723 Aneurysm of iliac artery: Principal | ICD-10-CM

## 2021-08-05 DIAGNOSIS — T82310A Breakdown (mechanical) of aortic (bifurcation) graft (replacement), initial encounter: Secondary | ICD-10-CM

## 2021-08-05 LAB — CBC
HCT: 40.2 % (ref 39.0–52.0)
Hemoglobin: 13.3 g/dL (ref 13.0–17.0)
MCH: 30.5 pg (ref 26.0–34.0)
MCHC: 33.1 g/dL (ref 30.0–36.0)
MCV: 92.2 fL (ref 80.0–100.0)
Platelets: 139 10*3/uL — ABNORMAL LOW (ref 150–400)
RBC: 4.36 MIL/uL (ref 4.22–5.81)
RDW: 13.5 % (ref 11.5–15.5)
WBC: 12.3 10*3/uL — ABNORMAL HIGH (ref 4.0–10.5)
nRBC: 0 % (ref 0.0–0.2)

## 2021-08-05 LAB — BASIC METABOLIC PANEL
Anion gap: 6 (ref 5–15)
BUN: 26 mg/dL — ABNORMAL HIGH (ref 8–23)
CO2: 21 mmol/L — ABNORMAL LOW (ref 22–32)
Calcium: 8.8 mg/dL — ABNORMAL LOW (ref 8.9–10.3)
Chloride: 107 mmol/L (ref 98–111)
Creatinine, Ser: 1.06 mg/dL (ref 0.61–1.24)
GFR, Estimated: >60 mL/min (ref >60)
Glucose, Bld: 125 mg/dL — ABNORMAL HIGH (ref 70–99)
Potassium: 4.3 mmol/L (ref 3.5–5.1)
Sodium: 134 mmol/L — ABNORMAL LOW (ref 135–145)

## 2021-08-05 MED ORDER — ASPIRIN 81 MG PO TBEC: 81.0000 mg | DELAYED_RELEASE_TABLET | Freq: Every day | ORAL | 3 refills | Status: DC

## 2021-08-05 NOTE — Plan of Care (Signed)
Admitted with L groin PAD in room air.

## 2021-08-05 NOTE — Discharge Summary (Addendum)
[facility]    Discharge Summary  Patient ID:  Stuart Harvey MRN: [id number] DOB/AGE: Jun 11, 1951 70 y.o.  Admit date: 08/04/2021 Discharge date: 08/05/2021 Date of Surgery: 08/04/2021 Surgeon: Surgeon(s): Schnier, Dolores Lory, MD Stuart Huxley, MD  Admission Diagnosis: Iliac aneurysm [facility]) [I72.3]  Discharge Diagnoses:  Iliac aneurysm [facility]) [I72.3]  Secondary Diagnoses: Past Medical History:  Diagnosis Date   AAA (abdominal aortic aneurysm) ([place])    a.) s/p EVAR in 08/2007 by Dr. Hulda Humphrey. b.) CTA on 06/30/2021 --> interval aneurysmal dilitation superior to stent graft with the juxtarenal aorta measuring 3.8 cm.   Anemia    Arthritis    both knees   Chronic anticoagulation    Apixaban   CKD (chronic kidney disease), stage II    Coronary artery disease    a.) 4v CABG @ [facility] 06/2007 (LIMA-LAD, VG-RPDA, VG-OM3,VG-D2); b.) LHC 2011: patent grafts EF 40%, c.) LHC 06/05/18: LM nl, pLAD 50%, p-mLAD 50%, ostD1 70%, ost ramus 30%, ost-pLCx 40%, pLCx 100% (chronically occluded), OM3 100%, p-dRCA 100% (chronically occluded), LIMA-LAD patent, VG-RPDA patent, VG-OM3 mid-graft 60% and 80%   Depression    GERD (gastroesophageal reflux disease)    Hernia of abdominal cavity    History of 2019 novel coronavirus disease (COVID-19) 09/2019   Hyperlipidemia    Hypertension    Hypothyroidism    Iliac artery aneurysm, right ([place])    a.) at the distal landing zone of the right iliac limb measuring 3 cm   Right middle lobe pulmonary nodule 06/30/2021   a.) measured 8 mm by CT on 06/30/2021.   S/P CABG x 4 06/25/2007   a.) LIMA-LAD, SVG-RPDA, SVG-OM3, SVG-D2   Sigmoid diverticulosis    Ventricular tachycardia ([place]) 05/2018   a.) found unresponsive by EMS; VT noted --> defibrillation achieved ROSC. Briefly intubated. Episode felt to be secondary to heat stoke.   Procedure(s): 08/04/21: 1.   Ultrasound guidance for vascular access right femoral artery 2.  Catheter placement  into right hypogastric artery from right femoral approach 3.  Right hypogastric artery angiogram 4.  Coil embolization of the right hypogastric artery with a total of 4 Ruby coils 5.  Repair of right iliac artery aneurysm with 2 right iliac artery extension limbs starting at the flow divider of the previous stent graft with a 14 mm diameter by 14 cm length Gore iliac limb and a 14 mm diameter by 12 cm length Gore iliac limb  Discharged Condition: Good  HPI / Hospital Course:  Stuart Harvey is a 70 y.o. male who presents with right common iliac artery aneurysm progression now up to 3 cm creating a type Ib endoleak from the previous aneurysm repair stent graft.  This needs to be treated and the most appropriate way to treat this will be to coil embolize the right hypogastric artery and extend the stent graft down to the right external iliac artery.  Risks and benefits were discussed and informed consent was obtained. On 08/04/21, the patient underwent:  1.  Ultrasound guidance for vascular access right femoral artery 2.  Catheter placement into right hypogastric artery from right femoral approach 3.  Right hypogastric artery angiogram 4.  Coil embolization of the right hypogastric artery with a total of 4 Ruby coils 5.  Repair of right iliac artery aneurysm with 2 right iliac artery extension limbs starting at the flow divider of the previous stent graft with a 14 mm diameter by 14 cm length Gore iliac limb and  a 14 mm diameter by 12 cm length Gore iliac limb  The patient tolerated the procedure well and was transferred from the operating room to the observation.  The patient's night of surgery was unremarkable.  During his brief stay, his diet was advanced, he was urinating independently, his pain was controlled to the use of p.o. pain medication he was ambulating at baseline.  Day of discharge, the patient was afebrile with stable vital signs and essentially improved physical exam.  Physical Exam:   Alert and oriented x3, No acute distress Cardiovascular: Regular rate and rhythm Pulmonary: Clear to auscultation bilaterally Abdomen: Soft, non-tender, non-distended Right groin:  Access site: Clean, dry and intact Extremity: Bilateral legs are warm distally toes  Labs: As below  Complications: None  Consults: None  Significant Diagnostic Studies: CBC Lab Results  Component Value Date   WBC 12.3 (H) 08/05/2021   HGB 13.3 08/05/2021   HCT 40.2 08/05/2021   MCV 92.2 08/05/2021   PLT 139 (L) 08/05/2021   BMET    Component Value Date/Time   NA 134 (L) 08/05/2021 0502   NA 138 07/30/2021 0954   NA 135 02/20/2015 1949   K 4.3 08/05/2021 0502   K 4.5 02/20/2015 1949   CL 107 08/05/2021 0502   CL 105 02/20/2015 1949   CO2 21 (L) 08/05/2021 0502   CO2 26 02/20/2015 1949   GLUCOSE 125 (H) 08/05/2021 0502   GLUCOSE 99 02/20/2015 1949   BUN 26 (H) 08/05/2021 0502   BUN 24 07/30/2021 0954   BUN 24 (H) 02/20/2015 1949   CREATININE 1.06 08/05/2021 0502   CREATININE 1.37 (H) 02/20/2015 1949   CALCIUM 8.8 (L) 08/05/2021 0502   CALCIUM 8.8 (L) 02/20/2015 1949   GFRNONAA >60 08/05/2021 0502   GFRNONAA 55 (L) 02/20/2015 1949   GFRAA 65 01/05/2021 1335   GFRAA >60 02/20/2015 1949   COAG Lab Results  Component Value Date   INR 1.12 06/03/2018   INR 0.97 09/06/2016   INR 1.0 07/30/2014   Disposition:  Discharge to :Home  Allergies as of 08/05/2021       Reactions   Benzodiazepines    Paradoxical agitation and delirium   Codeine Nausea Only   Tetracycline Rash        Medication List     TAKE these medications    aspirin 81 MG EC tablet Take 1 tablet (81 mg total) by mouth daily at 6 (six) AM. Swallow whole. Start taking on: August 06, 2021   buPROPion 200 MG 12 hr tablet Commonly known as: WELLBUTRIN SR Take 1 tablet (200 mg total) by mouth 2 (two) times daily.   Cholecalciferol 50 MCG (2000 UT) Caps Take 1 capsule by mouth daily.   citalopram 20 MG  tablet Commonly known as: CELEXA Take 1 tablet (20 mg total) by mouth daily.   Eliquis 5 MG Tabs tablet Generic drug: apixaban Take 1 tablet (5 mg total) by mouth 2 (two) times daily.   ezetimibe 10 MG tablet Commonly known as: Zetia Take 1 tablet (10 mg total) by mouth daily.   levothyroxine 75 MCG tablet Commonly known as: SYNTHROID Take 1 tablet (75 mcg total) by mouth daily.   melatonin 3 MG Tabs tablet Take 1 tablet (3 mg total) by mouth at bedtime.   metoprolol tartrate 25 MG tablet Commonly known as: LOPRESSOR Take 0.5 tablets (12.5 mg total) by mouth 2 (two) times daily. Please contact office to schedule follow up visit.   omeprazole 20  MG capsule Commonly known as: PRILOSEC Take 1 capsule (20 mg total) by mouth daily.   rosuvastatin 40 MG tablet Commonly known as: CRESTOR Take 1 tablet (40 mg total) by mouth daily.       Verbal and written Discharge instructions given to the patient. Wound care per Discharge AVS  Follow-up Information     Dew, Erskine Squibb, MD Follow up in 1 month(s).   Specialties: Vascular Surgery, Radiology, Interventional Cardiology Why: First post-op. Can see Dew or Arna Medici. Will need aorto-iliac with visit. Contact information: [place] [address] [phone]                Signed: Sela Hua, PA-C 08/05/2021, 12:13 PM

## 2021-08-05 NOTE — Progress Notes (Signed)
Okay for per Dr. Lucky Cowboy to remove PAD and get patient out of bed.  Will d/c today.

## 2021-08-05 NOTE — Discharge Instructions (Signed)
Vascular Surgery Discharge Instructions:  1) You may shower as of Friday.  Gently clean your groins with soap and water.  Gently pat dry. 2) Please do not engage in strenuous activity or lifting greater than 10 pounds until you are cleared at your first postoperative follow-up. 3) Please do not drive for at least 2 weeks.

## 2021-08-06 ENCOUNTER — Ambulatory Visit (INDEPENDENT_AMBULATORY_CARE_PROVIDER_SITE_OTHER): Payer: Medicare HMO

## 2021-08-06 VITALS — Ht 70.0 in | Wt 217.0 lb

## 2021-08-06 DIAGNOSIS — Z0000 Encounter for general adult medical examination without abnormal findings: Secondary | ICD-10-CM | POA: Diagnosis not present

## 2021-08-06 NOTE — Progress Notes (Signed)
I connected with Stuart Harvey today by telephone and verified that I am speaking with the correct person using two identifiers. Location patient: home Location provider: work Persons participating in the virtual visit: Stuart Harvey, Stuart Durand LPN.   I discussed the limitations, risks, security and privacy concerns of performing an evaluation and management service by telephone and the availability of in person appointments. I also discussed with the patient that there may be a patient responsible charge related to this service. The patient expressed understanding and verbally consented to this telephonic visit.    Interactive audio and video telecommunications were attempted between this provider and patient, however failed, due to patient having technical difficulties OR patient did not have access to video capability.  We continued and completed visit with audio only.     Vital signs may be patient reported or missing.  Subjective:   Stuart Harvey is a 70 y.o. male who presents for Medicare Annual/Subsequent preventive examination.  Review of Systems     Cardiac Risk Factors include: advanced age (>4men, >46 women);hypertension;male gender;obesity (BMI >30kg/m2);sedentary lifestyle     Objective:    Today's Vitals   08/06/21 1112  Weight: 217 lb (98.4 kg)  Height: 5\' 10"  (1.778 m)   Body mass index is 31.14 kg/m.  Advanced Directives 08/06/2021 08/04/2021 07/22/2021 08/03/2020 03/13/2020 07/15/2019 07/12/2018  Does Patient Have a Medical Advance Directive? Yes No Yes Yes Yes Yes Yes  Type of Advance Directive Living will - [place];Living will [place];Living will - Living will;Healthcare Power of [place];Living will  Does patient want to make changes to medical advance directive? - - No - Patient declined - - - -  Copy of [place] in Chart? No - copy requested - No - copy requested No -  copy requested - No - copy requested No - copy requested  Would patient like information on creating a medical advance directive? - - - - - - -    Current Medications (verified) Outpatient Encounter Medications as of 08/06/2021  Medication Sig   buPROPion (WELLBUTRIN SR) 200 MG 12 hr tablet Take 1 tablet (200 mg total) by mouth 2 (two) times daily.   Cholecalciferol 50 MCG (2000 UT) CAPS Take 1 capsule by mouth daily.    citalopram (CELEXA) 20 MG tablet Take 1 tablet (20 mg total) by mouth daily.   ELIQUIS 5 MG TABS tablet Take 1 tablet (5 mg total) by mouth 2 (two) times daily.   ezetimibe (ZETIA) 10 MG tablet Take 1 tablet (10 mg total) by mouth daily.   levothyroxine (SYNTHROID) 75 MCG tablet Take 1 tablet (75 mcg total) by mouth daily.   melatonin 3 MG TABS tablet Take 1 tablet (3 mg total) by mouth at bedtime.   metoprolol tartrate (LOPRESSOR) 25 MG tablet Take 0.5 tablets (12.5 mg total) by mouth 2 (two) times daily. Please contact office to schedule follow up visit.   omeprazole (PRILOSEC) 20 MG capsule Take 1 capsule (20 mg total) by mouth daily.   rosuvastatin (CRESTOR) 40 MG tablet Take 1 tablet (40 mg total) by mouth daily.   aspirin EC 81 MG EC tablet Take 1 tablet (81 mg total) by mouth daily at 6 (six) AM. Swallow whole. (Patient not taking: Reported on 08/06/2021)   No facility-administered encounter medications on file as of 08/06/2021.    Allergies (verified) Benzodiazepines, Codeine, and Tetracycline   History: Past Medical History:  Diagnosis Date  AAA (abdominal aortic aneurysm) ([place])    a.) s/p EVAR in 08/2007 by Dr. Hulda Humphrey. b.) CTA on 06/30/2021 --> interval aneurysmal dilitation superior to stent graft with the juxtarenal aorta measuring 3.8 cm.   Anemia    Arthritis    both knees   Chronic anticoagulation    Apixaban   CKD (chronic kidney disease), stage II    Coronary artery disease    a.) 4v CABG @ [facility] 06/2007 (LIMA-LAD, VG-RPDA, VG-OM3,VG-D2); b.) LHC  2011: patent grafts EF 40%, c.) LHC 06/05/18: LM nl, pLAD 50%, p-mLAD 50%, ostD1 70%, ost ramus 30%, ost-pLCx 40%, pLCx 100% (chronically occluded), OM3 100%, p-dRCA 100% (chronically occluded), LIMA-LAD patent, VG-RPDA patent, VG-OM3 mid-graft 60% and 80%   Depression    GERD (gastroesophageal reflux disease)    Hernia of abdominal cavity    History of 2019 novel coronavirus disease (COVID-19) 09/2019   Hyperlipidemia    Hypertension    Hypothyroidism    Iliac artery aneurysm, right ([place])    a.) at the distal landing zone of the right iliac limb measuring 3 cm   Right middle lobe pulmonary nodule 06/30/2021   a.) measured 8 mm by CT on 06/30/2021.   S/P CABG x 4 06/25/2007   a.) LIMA-LAD, SVG-RPDA, SVG-OM3, SVG-D2   Sigmoid diverticulosis    Ventricular tachycardia ([place]) 05/2018   a.) found unresponsive by EMS; VT noted --> defibrillation achieved ROSC. Briefly intubated. Episode felt to be secondary to heat stoke.   Past Surgical History:  Procedure Laterality Date   ABDOMINAL AORTIC ANEURYSM REPAIR N/A 08/29/2007   Procedure: EVAR; Location: [facility]; Surgeon: Collene Schlichter, MD   ABDOMINAL AORTIC ANEURYSM REPAIR N/A 12/05/2012   Procedure: Abdominal aortic aneurysm of approximately 6 cm in maximal diameter status post previous endovascular repair with type I and III endoleaks; Location: [facility]; Surgeon: Leotis Pain, MD   CARDIAC CATHETERIZATION Left 06/28/2010   3v CAD with patent CABG grafts; LVEF 40%; stable aortic stent graft; Location: [facility]; Surgeon: Serafina Royals, MD   CARDIAC CATHETERIZATION Left 06/18/2007   3v CAD; LVEF 50%; refer to CVTS for CABG; Location: [facility]; Surgeon: [name], MD   CORONARY ARTERY BYPASS GRAFT N/A 06/25/2007   Procedure: 4v CABG (LIMA-LAD, SVG-RPDA, SVG-OM3, SVG-D2); Location: [facility]; Surgeon: Marnee Guarneri, MD   CORONARY/GRAFT ANGIOGRAPHY N/A 06/05/2018   Procedure: Remus Blake ANGIOGRAPHY;  Surgeon: Nelva Bush, MD;  Location: [facility];   Service: Cardiovascular;  Laterality: N/A;   ENDOVASCULAR REPAIR/STENT GRAFT N/A 08/04/2021   Procedure: ENDOVASCULAR REPAIR/STENT GRAFT;  Surgeon: Algernon Huxley, MD;  Location: [facility];  Service: Cardiovascular;  Laterality: N/A;   EXPLORATORY LAPAROTOMY  1997   HERNIA REPAIR     KNEE ARTHROPLASTY Left 09/19/2016   Procedure: COMPUTER ASSISTED TOTAL KNEE ARTHROPLASTY;  Surgeon: Dereck Leep, MD;  Location: [facility];  Service: Orthopedics;  Laterality: Left;   TOTAL KNEE ARTHROPLASTY Right 09/2016   Family History  Problem Relation Age of Onset   Heart disease Mother    Heart attack Mother    Hypertension Mother    Heart attack Father    Hypertension Father    Kidney disease Brother    Depression Maternal Grandmother    Depression Maternal Grandfather    Heart disease Other    Heart attack Other    Prostate cancer Neg Hx    Kidney cancer Neg Hx    Stomach cancer Neg Hx    Colon cancer Neg Hx    Social History  Socioeconomic History   Marital status: Married    Spouse name: Not on file   Number of children: Not on file   Years of education: 14   Highest education level: Some college, no degree  Occupational History   Occupation: retired  Tobacco Use   Smoking status: Former    Packs/day: 1.00    Years: 35.00    Pack years: 35.00    Types: Cigarettes    Quit date: 12/16/2005    Years since quitting: 15.6   Smokeless tobacco: Current    Types: Chew   Tobacco comments:    pt states he very rarely uses smokeless tobacco   Vaping Use   Vaping Use: Never used  Substance and Sexual Activity   Alcohol use: No   Drug use: No   Sexual activity: Yes    Partners: Female  Other Topics Concern   Not on file  Social History Narrative   Not on file   Social Determinants of Health   Financial Resource Strain: Low Risk    Difficulty of Paying Living Expenses: Not hard at all  Food Insecurity: No Food Insecurity   Worried About Charity fundraiser in the Last  Year: Never true   [place] in the Last Year: Never true  Transportation Needs: No Transportation Needs   Lack of Transportation (Medical): No   Lack of Transportation (Non-Medical): No  Physical Activity: Inactive   Days of Exercise per Week: 0 days   Minutes of Exercise per Session: 0 min  Stress: No Stress Concern Present   Feeling of Stress : Not at all  Social Connections: Not on file    Tobacco Counseling Ready to quit: Not Answered Counseling given: Not Answered Tobacco comments: pt states he very rarely uses smokeless tobacco    Clinical Intake:  Pre-visit preparation completed: Yes  Pain : No/denies pain     Nutritional Status: BMI > 30  Obese Nutritional Risks: None Diabetes: No  How often do you need to have someone help you when you read instructions, pamphlets, or other written materials from your doctor or pharmacy?: 1 - Never What is the last grade level you completed in school?: 3yr college  Diabetic? no  Interpreter Needed?: No  Information entered by :: NAllen LPN   Activities of Daily Living In your present state of health, do you have any difficulty performing the following activities: 08/06/2021 08/04/2021  Hearing? Y N  Comment a little bit -  Vision? N N  Difficulty concentrating or making decisions? N N  Walking or climbing stairs? N N  Dressing or bathing? N N  Doing errands, shopping? N -  Preparing Food and eating ? N -  Using the Toilet? N -  In the past six months, have you accidently leaked urine? Y -  Do you have problems with loss of bowel control? N -  Managing your Medications? N -  Managing your Finances? N -  Housekeeping or managing your Housekeeping? N -  Some recent data might be hidden    Patient Care Team: Charlynne Cousins, MD as PCP - General (Internal Medicine) Kathrine Haddock, NP as PCP - Family Medicine (Nurse Practitioner) Wellington Hampshire, MD as PCP - Cardiology (Cardiology) Lucky Cowboy Erskine Squibb, MD as Referring  Physician (Vascular Surgery) Wellington Hampshire, MD as Consulting Physician (Cardiology) Vladimir Faster, [facility] (Pharmacist)  Indicate any recent Medical Services you may have received from other than [facility] providers in the past  year (date may be approximate).     Assessment:   This is a routine wellness examination for [facility].  Hearing/Vision screen Vision Screening - Comments:: No regular eye exams, [facility]  Dietary issues and exercise activities discussed: Current Exercise Habits: The patient does not participate in regular exercise at present   Goals Addressed             This Visit's Progress    Patient Stated       08/06/2021, wants to get back to normal       Depression Screen PHQ 2/9 Scores 08/06/2021 07/30/2021 01/05/2021 08/03/2020 07/03/2020 12/27/2019 07/15/2019  PHQ - 2 Score 0 0 0 0 0 0 0  PHQ- 9 Score - 3 3 0 2 0 -    Fall Risk Fall Risk  08/06/2021 08/03/2020 07/15/2019 05/22/2019 02/19/2019  Falls in the past year? 1 0 0 0 -  Comment tripped going up the steps - - - -  Number falls in past yr: 0 - - 0 0  Injury with Fall? 0 - - 0 0  Risk Factor Category  - - - - -  Risk for fall due to : Medication side effect Medication side effect - - -  Follow up Falls evaluation completed;Education provided;Falls prevention discussed Falls evaluation completed;Education provided;Falls prevention discussed - Falls evaluation completed -    FALL RISK PREVENTION PERTAINING TO THE HOME:  Any stairs in or around the home? Yes  If so, are there any without handrails? No  Home free of loose throw rugs in walkways, pet beds, electrical cords, etc? Yes  Adequate lighting in your home to reduce risk of falls? Yes   ASSISTIVE DEVICES UTILIZED TO PREVENT FALLS:  Life alert? No  Use of a cane, walker or w/c? No  Grab bars in the bathroom? No  Shower chair or bench in shower? Yes  Elevated toilet seat or a handicapped toilet? Yes   TIMED UP AND GO:  Was the test performed?  No .       Cognitive Function: MMSE - Mini Mental State Exam 07/01/2016  Orientation to time 5  Orientation to Place 5  Registration 3  Attention/ Calculation 5  Recall 2  Language- name 2 objects 2  Language- repeat 1  Language- follow 3 step command 3  Language- read & follow direction 1  Write a sentence 1  Copy design 1  Total score 29     6CIT Screen 08/06/2021 08/03/2020 07/12/2018 07/05/2017  What Year? 0 points 0 points 0 points 0 points  What month? 0 points 0 points 0 points 0 points  What time? 0 points 0 points 0 points 0 points  Count back from 20 0 points 0 points 0 points 0 points  Months in reverse 0 points 0 points 0 points 0 points  Repeat phrase 0 points 0 points 0 points 0 points  Total Score 0 0 0 0    Immunizations Immunization History  Administered Date(s) Administered   Janssen (J&J) SARS-COV-2 Vaccination 03/09/2020   Pneumococcal Conjugate-13 07/01/2016   Pneumococcal Polysaccharide-23 10/07/2009, 07/07/2017   Tdap 06/16/2015   Zoster, Live 06/16/2015    TDAP status: Up to date  Flu Vaccine status: Declined, Education has been provided regarding the importance of this vaccine but patient still declined. Advised may receive this vaccine at local pharmacy or Health Dept. Aware to provide a copy of the vaccination record if obtained from local pharmacy or Health Dept. Verbalized acceptance and  understanding.  Pneumococcal vaccine status: Up to date  Covid-19 vaccine status: Declined, Education has been provided regarding the importance of this vaccine but patient still declined. Advised may receive this vaccine at local pharmacy or Health Dept.or vaccine clinic. Aware to provide a copy of the vaccination record if obtained from local pharmacy or Health Dept. Verbalized acceptance and understanding.  Qualifies for Shingles Vaccine? Yes   Zostavax completed No   Shingrix Completed?: No.    Education has been provided regarding the importance of this  vaccine. Patient has been advised to call insurance company to determine out of pocket expense if they have not yet received this vaccine. Advised may also receive vaccine at local pharmacy or Health Dept. Verbalized acceptance and understanding.  Screening Tests Health Maintenance  Topic Date Due   COLONOSCOPY (Pts 45-64yrs Insurance coverage will need to be confirmed)  08/31/2020   Zoster Vaccines- Shingrix (1 of 2) 10/29/2021 (Originally 06/27/1970)   COVID-19 Vaccine (2 - Janssen risk series) 01/05/2022 (Originally 04/06/2020)   INFLUENZA VACCINE  02/11/2022 (Originally 06/14/2021)   TETANUS/TDAP  06/15/2025   Hepatitis C Screening  Completed   HPV VACCINES  Aged Out    Health Maintenance  Health Maintenance Due  Topic Date Due   COLONOSCOPY (Pts 45-67yrs Insurance coverage will need to be confirmed)  08/31/2020    Colorectal cancer screening: Type of screening: Colonoscopy. Completed 08/31/2020. Repeat every 3 years  Lung Cancer Screening: (Low Dose CT Chest recommended if Age 55-80 years, 30 pack-year currently smoking OR have quit w/in 15years.) does not qualify.   Lung Cancer Screening Referral: no  Additional Screening:  Hepatitis C Screening: does qualify; Completed 08/20/2018  Vision Screening: Recommended annual ophthalmology exams for early detection of glaucoma and other disorders of the eye. Is the patient up to date with their annual eye exam?  No  Who is the provider or what is the name of the office in which the patient attends annual eye exams? [facility] If pt is not established with a provider, would they like to be referred to a provider to establish care? No .   Dental Screening: Recommended annual dental exams for proper oral hygiene  Community Resource Referral / Chronic Care Management: CRR required this visit?  No   CCM required this visit?  No      Plan:     I have personally reviewed and noted the following in the patient's chart:   Medical and  social history Use of alcohol, tobacco or illicit drugs  Current medications and supplements including opioid prescriptions. Patient is not currently taking opioid prescriptions. Functional ability and status Nutritional status Physical activity Advanced directives List of other physicians Hospitalizations, surgeries, and ER visits in previous 12 months Vitals Screenings to include cognitive, depression, and falls Referrals and appointments  In addition, I have reviewed and discussed with patient certain preventive protocols, quality metrics, and best practice recommendations. A written personalized care plan for preventive services as well as general preventive health recommendations were provided to patient.     Kellie Simmering, LPN   03/22/3266   Nurse Notes:

## 2021-08-06 NOTE — Patient Instructions (Signed)
Stuart Harvey , Thank you for taking time to come for your Medicare Wellness Visit. I appreciate your ongoing commitment to your health goals. Please review the following plan we discussed and let me know if I can assist you in the future.   Screening recommendations/referrals: Colonoscopy: completed 08/31/2017 Recommended yearly ophthalmology/optometry visit for glaucoma screening and checkup Recommended yearly dental visit for hygiene and checkup  Vaccinations: Influenza vaccine: decline Pneumococcal vaccine: completed 07/07/2017 Tdap vaccine: completed 06/16/2015, due 06/15/2025 Shingles vaccine: discussed   Covid-19:  03/09/2020  Advanced directives: Please bring a copy of your POA (Power of Attorney) and/or Living Will to your next appointment.   Conditions/risks identified: chews tobacco some times  Next appointment: Follow up in one year for your annual wellness visit.   Preventive Care 32 Years and Older, Male Preventive care refers to lifestyle choices and visits with your health care provider that can promote health and wellness. What does preventive care include? A yearly physical exam. This is also called an annual well check. Dental exams once or twice a year. Routine eye exams. Ask your health care provider how often you should have your eyes checked. Personal lifestyle choices, including: Daily care of your teeth and gums. Regular physical activity. Eating a healthy diet. Avoiding tobacco and drug use. Limiting alcohol use. Practicing safe sex. Taking low doses of aspirin every day. Taking vitamin and mineral supplements as recommended by your health care provider. What happens during an annual well check? The services and screenings done by your health care provider during your annual well check will depend on your age, overall health, lifestyle risk factors, and family history of disease. Counseling  Your health care provider may ask you questions about your: Alcohol  use. Tobacco use. Drug use. Emotional well-being. Home and relationship well-being. Sexual activity. Eating habits. History of falls. Memory and ability to understand (cognition). Work and work Statistician. Screening  You may have the following tests or measurements: Height, weight, and BMI. Blood pressure. Lipid and cholesterol levels. These may be checked every 5 years, or more frequently if you are over 51 years old. Skin check. Lung cancer screening. You may have this screening every year starting at age 60 if you have a 30-pack-year history of smoking and currently smoke or have quit within the past 15 years. Fecal occult blood test (FOBT) of the stool. You may have this test every year starting at age 36. Flexible sigmoidoscopy or colonoscopy. You may have a sigmoidoscopy every 5 years or a colonoscopy every 10 years starting at age 31. Prostate cancer screening. Recommendations will vary depending on your family history and other risks. Hepatitis C blood test. Hepatitis B blood test. Sexually transmitted disease (STD) testing. Diabetes screening. This is done by checking your blood sugar (glucose) after you have not eaten for a while (fasting). You may have this done every 1-3 years. Abdominal aortic aneurysm (AAA) screening. You may need this if you are a current or former smoker. Osteoporosis. You may be screened starting at age 70 if you are at high risk. Talk with your health care provider about your test results, treatment options, and if necessary, the need for more tests. Vaccines  Your health care provider may recommend certain vaccines, such as: Influenza vaccine. This is recommended every year. Tetanus, diphtheria, and acellular pertussis (Tdap, Td) vaccine. You may need a Td booster every 10 years. Zoster vaccine. You may need this after age 23. Pneumococcal 13-valent conjugate (PCV13) vaccine. One dose is recommended  after age 52. Pneumococcal polysaccharide  (PPSV23) vaccine. One dose is recommended after age 35. Talk to your health care provider about which screenings and vaccines you need and how often you need them. This information is not intended to replace advice given to you by your health care provider. Make sure you discuss any questions you have with your health care provider. Document Released: 11/27/2015 Document Revised: 07/20/2016 Document Reviewed: 09/01/2015 Elsevier Interactive Patient Education  2017 [employer] Prevention in the Home Falls can cause injuries. They can happen to people of all ages. There are many things you can do to make your home safe and to help prevent falls. What can I do on the outside of my home? Regularly fix the edges of walkways and driveways and fix any cracks. Remove anything that might make you trip as you walk through a door, such as a raised step or threshold. Trim any bushes or trees on the path to your home. Use bright outdoor lighting. Clear any walking paths of anything that might make someone trip, such as rocks or tools. Regularly check to see if handrails are loose or broken. Make sure that both sides of any steps have handrails. Any raised decks and porches should have guardrails on the edges. Have any leaves, snow, or ice cleared regularly. Use sand or salt on walking paths during winter. Clean up any spills in your garage right away. This includes oil or grease spills. What can I do in the bathroom? Use night lights. Install grab bars by the toilet and in the tub and shower. Do not use towel bars as grab bars. Use non-skid mats or decals in the tub or shower. If you need to sit down in the shower, use a plastic, non-slip stool. Keep the floor dry. Clean up any water that spills on the floor as soon as it happens. Remove soap buildup in the tub or shower regularly. Attach bath mats securely with double-sided non-slip rug tape. Do not have throw rugs and other things on the  floor that can make you trip. What can I do in the bedroom? Use night lights. Make sure that you have a light by your bed that is easy to reach. Do not use any sheets or blankets that are too big for your bed. They should not hang down onto the floor. Have a firm chair that has side arms. You can use this for support while you get dressed. Do not have throw rugs and other things on the floor that can make you trip. What can I do in the kitchen? Clean up any spills right away. Avoid walking on wet floors. Keep items that you use a lot in easy-to-reach places. If you need to reach something above you, use a strong step stool that has a grab bar. Keep electrical cords out of the way. Do not use floor polish or wax that makes floors slippery. If you must use wax, use non-skid floor wax. Do not have throw rugs and other things on the floor that can make you trip. What can I do with my stairs? Do not leave any items on the stairs. Make sure that there are handrails on both sides of the stairs and use them. Fix handrails that are broken or loose. Make sure that handrails are as long as the stairways. Check any carpeting to make sure that it is firmly attached to the stairs. Fix any carpet that is loose or worn. Avoid having throw  rugs at the top or bottom of the stairs. If you do have throw rugs, attach them to the floor with carpet tape. Make sure that you have a light switch at the top of the stairs and the bottom of the stairs. If you do not have them, ask someone to add them for you. What else can I do to help prevent falls? Wear shoes that: Do not have high heels. Have rubber bottoms. Are comfortable and fit you well. Are closed at the toe. Do not wear sandals. If you use a stepladder: Make sure that it is fully opened. Do not climb a closed stepladder. Make sure that both sides of the stepladder are locked into place. Ask someone to hold it for you, if possible. Clearly mark and make  sure that you can see: Any grab bars or handrails. First and last steps. Where the edge of each step is. Use tools that help you move around (mobility aids) if they are needed. These include: Canes. Walkers. Scooters. Crutches. Turn on the lights when you go into a dark area. Replace any light bulbs as soon as they burn out. Set up your furniture so you have a clear path. Avoid moving your furniture around. If any of your floors are uneven, fix them. If there are any pets around you, be aware of where they are. Review your medicines with your doctor. Some medicines can make you feel dizzy. This can increase your chance of falling. Ask your doctor what other things that you can do to help prevent falls. This information is not intended to replace advice given to you by your health care provider. Make sure you discuss any questions you have with your health care provider. Document Released: 08/27/2009 Document Revised: 04/07/2016 Document Reviewed: 12/05/2014 Elsevier Interactive Patient Education  2017 [employer].

## 2021-08-23 ENCOUNTER — Other Ambulatory Visit: Payer: Self-pay | Admitting: Cardiovascular Disease

## 2021-08-23 DIAGNOSIS — I480 Paroxysmal atrial fibrillation: Secondary | ICD-10-CM

## 2021-08-23 NOTE — Telephone Encounter (Signed)
Prescription refill request for Eliquis received. Indication: PAF Last office visit: 12/15/20  Rod Can MD Scr: 1.06 on 08/05/21 Age: 70 Weight: 97.5kg  Based on above findings Eliquis 5mg  twice daily is the appropriate dose.  Refill approved.

## 2021-09-02 ENCOUNTER — Other Ambulatory Visit: Payer: Self-pay | Admitting: Cardiovascular Disease

## 2021-09-02 ENCOUNTER — Other Ambulatory Visit: Payer: Self-pay | Admitting: Nurse Practitioner

## 2021-09-02 ENCOUNTER — Other Ambulatory Visit (INDEPENDENT_AMBULATORY_CARE_PROVIDER_SITE_OTHER): Payer: Self-pay | Admitting: Vascular Surgery

## 2021-09-02 DIAGNOSIS — I714 Abdominal aortic aneurysm, without rupture, unspecified: Secondary | ICD-10-CM

## 2021-09-03 NOTE — Telephone Encounter (Signed)
Requested Prescriptions  Pending Prescriptions Disp Refills  . omeprazole (PRILOSEC) 20 MG capsule [Pharmacy Med Name: OMEPRAZOLE DR 20 MG CAPSULE] 90 capsule 0    Sig: Take 1 capsule (20 mg total) by mouth daily.     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

## 2021-09-06 ENCOUNTER — Other Ambulatory Visit: Payer: Medicare HMO

## 2021-09-06 ENCOUNTER — Ambulatory Visit (INDEPENDENT_AMBULATORY_CARE_PROVIDER_SITE_OTHER): Payer: Medicare HMO

## 2021-09-06 ENCOUNTER — Ambulatory Visit (INDEPENDENT_AMBULATORY_CARE_PROVIDER_SITE_OTHER): Payer: Medicare HMO | Admitting: Nurse Practitioner

## 2021-09-06 ENCOUNTER — Encounter (INDEPENDENT_AMBULATORY_CARE_PROVIDER_SITE_OTHER): Payer: Self-pay | Admitting: Nurse Practitioner

## 2021-09-06 ENCOUNTER — Other Ambulatory Visit: Payer: Self-pay

## 2021-09-06 VITALS — BP 124/78 | HR 62 | Ht 71.0 in | Wt 214.0 lb

## 2021-09-06 DIAGNOSIS — I714 Abdominal aortic aneurysm, without rupture, unspecified: Secondary | ICD-10-CM

## 2021-09-06 DIAGNOSIS — N401 Enlarged prostate with lower urinary tract symptoms: Secondary | ICD-10-CM

## 2021-09-06 DIAGNOSIS — N529 Male erectile dysfunction, unspecified: Secondary | ICD-10-CM

## 2021-09-06 DIAGNOSIS — N138 Other obstructive and reflux uropathy: Secondary | ICD-10-CM

## 2021-09-07 LAB — PSA: Prostate Specific Ag, Serum: 1.4 ng/mL (ref 0.0–4.0)

## 2021-09-09 ENCOUNTER — Other Ambulatory Visit: Payer: Self-pay

## 2021-09-10 ENCOUNTER — Other Ambulatory Visit: Payer: Medicare HMO

## 2021-09-10 MED ORDER — HYDROCODONE-ACETAMINOPHEN 5-325 MG PO TABS: 1.0000 | ORAL_TABLET | Freq: Four times a day (QID) | ORAL | 0 refills | Status: DC | PRN

## 2021-09-12 ENCOUNTER — Encounter (INDEPENDENT_AMBULATORY_CARE_PROVIDER_SITE_OTHER): Payer: Self-pay | Admitting: Nurse Practitioner

## 2021-09-12 NOTE — Progress Notes (Signed)
Subjective:    Patient ID: Stuart Harvey, male    DOB: [birth year], 70 y.o.   MRN: [id number] Chief Complaint  Patient presents with   Follow-up    1 Mo [facility] post endovascular stent [place]    Stuart Harvey is a 70 year old male that returns today following intervention on 08/04/2021, for repair of a right common iliac artery aneurysm in addition to a type Ib endoleak.  The intervention included:  PROCEDURE: 1.   Ultrasound guidance for vascular access right femoral artery 2.  Catheter placement into right hypogastric artery from right femoral approach 3.  Right hypogastric artery angiogram 4.  Coil embolization of the right hypogastric artery with a total of 4 Ruby coils 5.  Repair of right iliac artery aneurysm with 2 right iliac artery extension limbs starting at the flow divider of the previous stent graft with a 14 mm diameter by 14 cm length Gore iliac limb and a 14 mm diameter by 12 cm length Gore iliac limb  Today the incision area is healed however the patient continues to have some claudication is buttocks area.  Otherwise he denies any issues.  Today noninvasive studies show no evidence of endoleak.  No evidence of stenosis in the aortic iliac system.  Compared to the previous CT the maximal abdominal aortic diameter is decreased.  The maximum size currently is 3.3 cm.  The right common iliac artery measures 3.2 cm.   Review of Systems  Musculoskeletal:  Positive for gait problem.  All other systems reviewed and are negative.     Objective:   Physical Exam Vitals reviewed.  HENT:     Head: Normocephalic.  Cardiovascular:     Rate and Rhythm: Normal rate.     Pulses: Normal pulses.  Pulmonary:     Effort: Pulmonary effort is normal.  Skin:    General: Skin is warm and dry.  Neurological:     Mental Status: He is alert and oriented to person, place, and time.  Psychiatric:        Mood and Affect: Mood normal.        Behavior: Behavior normal.        Thought Content:  Thought content normal.        Judgment: Judgment normal.    BP 124/78   Pulse 62   Ht 5\' 11"  (1.803 m)   Wt 214 lb (97.1 kg)   BMI 29.85 kg/m   Past Medical History:  Diagnosis Date   AAA (abdominal aortic aneurysm)    a.) s/p EVAR in 08/2007 by Dr. Hulda Humphrey. b.) CTA on 06/30/2021 --> interval aneurysmal dilitation superior to stent graft with the juxtarenal aorta measuring 3.8 cm.   Anemia    Arthritis    both knees   Chronic anticoagulation    Apixaban   CKD (chronic kidney disease), stage II    Coronary artery disease    a.) 4v CABG @ [place] 06/2007 (LIMA-LAD, VG-RPDA, VG-OM3,VG-D2); b.) LHC 2011: patent grafts EF 40%, c.) LHC 06/05/18: LM nl, pLAD 50%, p-mLAD 50%, ostD1 70%, ost ramus 30%, ost-pLCx 40%, pLCx 100% (chronically occluded), OM3 100%, p-dRCA 100% (chronically occluded), LIMA-LAD patent, VG-RPDA patent, VG-OM3 mid-graft 60% and 80%   Depression    GERD (gastroesophageal reflux disease)    Hernia of abdominal cavity    History of 2019 novel coronavirus disease (COVID-19) 09/2019   Hyperlipidemia    Hypertension    Hypothyroidism    Iliac artery aneurysm, right ([place])  a.) at the distal landing zone of the right iliac limb measuring 3 cm   Right middle lobe pulmonary nodule 06/30/2021   a.) measured 8 mm by CT on 06/30/2021.   S/P CABG x 4 06/25/2007   a.) LIMA-LAD, SVG-RPDA, SVG-OM3, SVG-D2   Sigmoid diverticulosis    Ventricular tachycardia 05/2018   a.) found unresponsive by EMS; VT noted --> defibrillation achieved ROSC. Briefly intubated. Episode felt to be secondary to heat stoke.    Social History   Socioeconomic History   Marital status: Married    Spouse name: Not on file   Number of children: Not on file   Years of education: 14   Highest education level: Some college, no degree  Occupational History   Occupation: retired  Tobacco Use   Smoking status: Former    Packs/day: 1.00    Years: 35.00    Pack years: 35.00    Types: Cigarettes     Quit date: 12/16/2005    Years since quitting: 15.7   Smokeless tobacco: Current    Types: Chew   Tobacco comments:    pt states he very rarely uses smokeless tobacco   Vaping Use   Vaping Use: Never used  Substance and Sexual Activity   Alcohol use: No   Drug use: No   Sexual activity: Yes    Partners: Female  Other Topics Concern   Not on file  Social History Narrative   Not on file   Social Determinants of Health   Financial Resource Strain: Low Risk    Difficulty of Paying Living Expenses: Not hard at all  Food Insecurity: No Food Insecurity   Worried About Charity fundraiser in the Last Year: Never true   [place] in the Last Year: Never true  Transportation Needs: No Transportation Needs   Lack of Transportation (Medical): No   Lack of Transportation (Non-Medical): No  Physical Activity: Inactive   Days of Exercise per Week: 0 days   Minutes of Exercise per Session: 0 min  Stress: No Stress Concern Present   Feeling of Stress : Not at all  Social Connections: Not on file  Intimate Partner Violence: Not on file    Past Surgical History:  Procedure Laterality Date   ABDOMINAL AORTIC ANEURYSM REPAIR N/A 08/29/2007   Procedure: EVAR; Location: [facility]; Surgeon: Collene Schlichter, MD   ABDOMINAL AORTIC ANEURYSM REPAIR N/A 12/05/2012   Procedure: Abdominal aortic aneurysm of approximately 6 cm in maximal diameter status post previous endovascular repair with type I and III endoleaks; Location: [facility]; Surgeon: Leotis Pain, MD   CARDIAC CATHETERIZATION Left 06/28/2010   3v CAD with patent CABG grafts; LVEF 40%; stable aortic stent graft; Location: [facility]; Surgeon: Serafina Royals, MD   CARDIAC CATHETERIZATION Left 06/18/2007   3v CAD; LVEF 50%; refer to CVTS for CABG; Location: [facility]; Surgeon: [name], MD   CORONARY ARTERY BYPASS GRAFT N/A 06/25/2007   Procedure: 4v CABG (LIMA-LAD, SVG-RPDA, SVG-OM3, SVG-D2); Location: [facility]; Surgeon: Marnee Guarneri, MD   CORONARY/GRAFT  ANGIOGRAPHY N/A 06/05/2018   Procedure: Remus Blake ANGIOGRAPHY;  Surgeon: Nelva Bush, MD;  Location: [facility];  Service: Cardiovascular;  Laterality: N/A;   ENDOVASCULAR REPAIR/STENT GRAFT N/A 08/04/2021   Procedure: ENDOVASCULAR REPAIR/STENT GRAFT;  Surgeon: Algernon Huxley, MD;  Location: [facility];  Service: Cardiovascular;  Laterality: N/A;   EXPLORATORY LAPAROTOMY  1997   HERNIA REPAIR     KNEE ARTHROPLASTY Left 09/19/2016   Procedure: COMPUTER ASSISTED  TOTAL KNEE ARTHROPLASTY;  Surgeon: Dereck Leep, MD;  Location: [facility];  Service: Orthopedics;  Laterality: Left;   TOTAL KNEE ARTHROPLASTY Right 09/2016    Family History  Problem Relation Age of Onset   Heart disease Mother    Heart attack Mother    Hypertension Mother    Heart attack Father    Hypertension Father    Kidney disease Brother    Depression Maternal Grandmother    Depression Maternal Grandfather    Heart disease Other    Heart attack Other    Prostate cancer Neg Hx    Kidney cancer Neg Hx    Stomach cancer Neg Hx    Colon cancer Neg Hx     Allergies  Allergen Reactions   Benzodiazepines     Paradoxical agitation and delirium   Codeine Nausea Only   Tetracycline Rash    CBC Latest Ref Rng & Units 08/05/2021 08/04/2021 07/30/2021  WBC 4.0 - 10.5 K/uL 12.3(H) 9.3 7.7  Hemoglobin 13.0 - 17.0 g/dL 13.3 14.1 14.6  Hematocrit 39.0 - 52.0 % 40.2 43.2 43.8  Platelets 150 - 400 K/uL 139(L) 145(L) 168      CMP     Component Value Date/Time   NA 134 (L) 08/05/2021 0502   NA 138 07/30/2021 0954   NA 135 02/20/2015 1949   K 4.3 08/05/2021 0502   K 4.5 02/20/2015 1949   CL 107 08/05/2021 0502   CL 105 02/20/2015 1949   CO2 21 (L) 08/05/2021 0502   CO2 26 02/20/2015 1949   GLUCOSE 125 (H) 08/05/2021 0502   GLUCOSE 99 02/20/2015 1949   BUN 26 (H) 08/05/2021 0502   BUN 24 07/30/2021 0954   BUN 24 (H) 02/20/2015 1949   CREATININE 1.06 08/05/2021 0502   CREATININE 1.37 (H)  02/20/2015 1949   CALCIUM 8.8 (L) 08/05/2021 0502   CALCIUM 8.8 (L) 02/20/2015 1949   PROT 6.4 07/30/2021 0954   PROT 7.9 12/13/2014 0934   ALBUMIN 4.0 07/30/2021 0954   ALBUMIN 3.9 12/13/2014 0934   AST 32 07/30/2021 0954   AST 30 12/13/2014 0934   ALT 18 07/30/2021 0954   ALT 20 12/13/2014 0934   ALKPHOS 101 07/30/2021 0954   ALKPHOS 123 (H) 12/13/2014 0934   BILITOT 0.4 07/30/2021 0954   BILITOT 0.8 12/13/2014 0934   GFRNONAA >60 08/05/2021 0502   GFRNONAA 55 (L) 02/20/2015 1949   GFRAA 65 01/05/2021 1335   GFRAA >60 02/20/2015 1949     No results found.     Assessment & Plan:   1. Abdominal aortic aneurysm (AAA) without rupture, unspecified part Today noninvasive studies show no evidence of endoleak.  The abdominal aorta diameter size is decreasing.  Right common iliac artery diameter size is relatively stable.  The patient does have claudication in the buttocks area however this is a consequence of embolization of the hypogastric artery to help with endoleak.  We will have the patient return to the office in 3 months for repeat noninvasive studies.   Current Outpatient Medications on File Prior to Visit  Medication Sig Dispense Refill   apixaban (ELIQUIS) 5 MG TABS tablet Take 1 tablet (5 mg total) by mouth 2 (two) times daily. 60 tablet 5   aspirin EC 81 MG EC tablet Take 1 tablet (81 mg total) by mouth daily at 6 (six) AM. Swallow whole. 90 tablet 3   buPROPion (WELLBUTRIN SR) 200 MG 12 hr tablet Take 1 tablet (200 mg total) by  mouth 2 (two) times daily. 180 tablet 0   Cholecalciferol 50 MCG (2000 UT) CAPS Take 1 capsule by mouth daily.      citalopram (CELEXA) 20 MG tablet Take 1 tablet (20 mg total) by mouth daily. 90 tablet 4   ezetimibe (ZETIA) 10 MG tablet Take 1 tablet (10 mg total) by mouth daily. 90 tablet 3   levothyroxine (SYNTHROID) 75 MCG tablet Take 1 tablet (75 mcg total) by mouth daily. 90 tablet 3   melatonin 3 MG TABS tablet Take 1 tablet (3 mg total)  by mouth at bedtime. 30 tablet 1   metoprolol tartrate (LOPRESSOR) 25 MG tablet Take 0.5 tablets (12.5 mg total) by mouth 2 (two) times daily. 30 tablet 0   omeprazole (PRILOSEC) 20 MG capsule Take 1 capsule (20 mg total) by mouth daily. 90 capsule 0   rosuvastatin (CRESTOR) 40 MG tablet Take 1 tablet (40 mg total) by mouth daily. 90 tablet 3   No current facility-administered medications on file prior to visit.    There are no Patient Instructions on file for this visit. No follow-ups on file.   Kris Hartmann, NP

## 2021-09-16 ENCOUNTER — Other Ambulatory Visit: Payer: Self-pay

## 2021-09-16 ENCOUNTER — Encounter: Payer: Self-pay | Admitting: Urology

## 2021-09-16 ENCOUNTER — Ambulatory Visit: Payer: Medicare HMO | Admitting: Urology

## 2021-09-16 VITALS — BP 119/81 | HR 63 | Ht 72.0 in | Wt 217.0 lb

## 2021-09-16 DIAGNOSIS — N401 Enlarged prostate with lower urinary tract symptoms: Secondary | ICD-10-CM

## 2021-09-16 DIAGNOSIS — N529 Male erectile dysfunction, unspecified: Secondary | ICD-10-CM

## 2021-09-16 DIAGNOSIS — Z125 Encounter for screening for malignant neoplasm of prostate: Secondary | ICD-10-CM

## 2021-09-16 DIAGNOSIS — N3281 Overactive bladder: Secondary | ICD-10-CM

## 2021-09-16 DIAGNOSIS — N138 Other obstructive and reflux uropathy: Secondary | ICD-10-CM

## 2021-09-16 NOTE — Patient Instructions (Signed)

## 2021-09-16 NOTE — Progress Notes (Signed)
   09/16/2021 4:18 PM   AUBREY VOONG 1951/09/22 [id number]  Reason for visit: Follow up PSA screening, BPH/OAB, polycystic left kidney, ED  HPI: He is a very comorbid 70 year old male with a extensive history of vascular disease, and remains on Eliquis.  He has a known polycystic left kidney that has been followed for over 5 years with MRI and ultrasound, with no concerning features.  He is asymptomatic with no recurrent infections.  I personally viewed and interpreted his recent CT abdomen and pelvis ordered by vascular from August 2022 that shows stable polycystic left kidney with no hydronephrosis or other suspicious lesions.  Regarding his BPH, he was previously on tamsulosin, but discontinued this medication with minimal change in his urinary symptoms.  His primary issue is urgency and frequency, likely secondary to high volume of diet sodas, tea, and coffee.  Behavioral strategies discussed.  PVRs have been normal.  In terms of erectile dysfunction, he and his wife are no longer sexually active, and he is no longer interested in medications for ED.  Finally, regarding PSA screening, most recent PSA was normal at 1.4 and stable from 1.3 last year.  We reviewed the guidelines that do not recommend routine screening in men over age 88, especially in those men with comorbidities, and he opts to discontinue screening per the guideline recommendations which is very reasonable.  RTC 1 year PVR and symptom check, likely can follow-up as needed at that time if doing well  [place], [place] [address] [place], [place] [address] [phone]

## 2021-09-17 ENCOUNTER — Ambulatory Visit: Payer: Medicare HMO | Admitting: Cardiovascular Disease

## 2021-09-17 ENCOUNTER — Encounter: Payer: Self-pay | Admitting: Cardiovascular Disease

## 2021-09-17 VITALS — BP 106/70 | HR 66 | Ht 72.0 in | Wt 214.5 lb

## 2021-09-17 DIAGNOSIS — I480 Paroxysmal atrial fibrillation: Secondary | ICD-10-CM

## 2021-09-17 DIAGNOSIS — E785 Hyperlipidemia, unspecified: Secondary | ICD-10-CM | POA: Diagnosis not present

## 2021-09-17 DIAGNOSIS — I10 Essential (primary) hypertension: Secondary | ICD-10-CM

## 2021-09-17 DIAGNOSIS — I714 Abdominal aortic aneurysm, without rupture, unspecified: Secondary | ICD-10-CM

## 2021-09-17 DIAGNOSIS — I2510 Atherosclerotic heart disease of native coronary artery without angina pectoris: Secondary | ICD-10-CM

## 2021-09-17 NOTE — Progress Notes (Signed)
Cardiology Office Note   Date:  09/17/2021   ID:  Stuart Harvey, DOB Sep 22, 1951, MRN [id number]  PCP:  Charlynne Cousins, MD  Cardiologist:   [name], MD   Chief Complaint  Patient presents with   Other    9 month f/u. Meds reviewed verbally with pt.      History of Present Illness: Stuart Harvey is a 70 y.o. male who presents fora followup visit regarding coronary artery disease.  He has known history of severe three-vessel coronary artery disease diagnosed in 2008 as well as a large infrarenal abdominal aortic aneurysm. He underwent coronary artery bypass graft surgery in August 2008 at [facility]. He underwent open surgical repair of aortic aneurysm in October of 2008.    Aortic aneurysm followup is done  by Dr. Lucky Cowboy.  He was hospitalized in July of 2019 with unresponsiveness.  He reportedly was down for about 1 hour with possible seizure-like activities.  He had numerous contusions.  Per EMS he was in ventricular tachycardia upon arrival and was cardioverted.  He was briefly intubated and was found to have mildly elevated troponin.  CPK was close to 4000.  Echo showed an EF of 50 to 55%.  He underwent cardiac catheterization which showed severe underlying three-vessel coronary artery disease with patent grafts including LIMA to LAD, SVG to RCA, SVG to diagonal and SVG to OM 3.  There was borderline disease in the SVG to OM but that was left to be treated medically given that it was supplying a small area.  The patient did develop atrial fibrillation while hospitalized.  He converted spontaneously.  His presentation was felt to be possibly triggered by heat stroke.  He underwent endovascular repair of iliac artery aneurysm in September.  He was placed on low-dose aspirin.  He has been doing well with no chest pain or shortness of breath.  He continues to take Eliquis twice daily with no bleeding issues.   Past Medical History:  Diagnosis Date   AAA (abdominal aortic aneurysm)    a.)  s/p EVAR in 08/2007 by Dr. Hulda Humphrey. b.) CTA on 06/30/2021 --> interval aneurysmal dilitation superior to stent graft with the juxtarenal aorta measuring 3.8 cm.   Anemia    Arthritis    both knees   Chronic anticoagulation    Apixaban   CKD (chronic kidney disease), stage II    Coronary artery disease    a.) 4v CABG @ [place] 06/2007 (LIMA-LAD, VG-RPDA, VG-OM3,VG-D2); b.) LHC 2011: patent grafts EF 40%, c.) LHC 06/05/18: LM nl, pLAD 50%, p-mLAD 50%, ostD1 70%, ost ramus 30%, ost-pLCx 40%, pLCx 100% (chronically occluded), OM3 100%, p-dRCA 100% (chronically occluded), LIMA-LAD patent, VG-RPDA patent, VG-OM3 mid-graft 60% and 80%   Depression    GERD (gastroesophageal reflux disease)    Hernia of abdominal cavity    History of 2019 novel coronavirus disease (COVID-19) 09/2019   Hyperlipidemia    Hypertension    Hypothyroidism    Iliac artery aneurysm, right ([place])    a.) at the distal landing zone of the right iliac limb measuring 3 cm   Right middle lobe pulmonary nodule 06/30/2021   a.) measured 8 mm by CT on 06/30/2021.   S/P CABG x 4 06/25/2007   a.) LIMA-LAD, SVG-RPDA, SVG-OM3, SVG-D2   Sigmoid diverticulosis    Ventricular tachycardia 05/2018   a.) found unresponsive by EMS; VT noted --> defibrillation achieved ROSC. Briefly intubated. Episode felt to be secondary to heat stoke.  Past Surgical History:  Procedure Laterality Date   ABDOMINAL AORTIC ANEURYSM REPAIR N/A 08/29/2007   Procedure: EVAR; Location: [place]; Surgeon: Collene Schlichter, MD   ABDOMINAL AORTIC ANEURYSM REPAIR N/A 12/05/2012   Procedure: Abdominal aortic aneurysm of approximately 6 cm in maximal diameter status post previous endovascular repair with type I and III endoleaks; Location: [place]; Surgeon: Leotis Pain, MD   CARDIAC CATHETERIZATION Left 06/28/2010   3v CAD with patent CABG grafts; LVEF 40%; stable aortic stent graft; Location: [place]; Surgeon: Serafina Royals, MD   CARDIAC CATHETERIZATION Left 06/18/2007   3v CAD;  LVEF 50%; refer to CVTS for CABG; Location: [place]; Surgeon: [name], MD   CORONARY ARTERY BYPASS GRAFT N/A 06/25/2007   Procedure: 4v CABG (LIMA-LAD, SVG-RPDA, SVG-OM3, SVG-D2); Location: [facility]; Surgeon: Marnee Guarneri, MD   CORONARY/GRAFT ANGIOGRAPHY N/A 06/05/2018   Procedure: Remus Blake ANGIOGRAPHY;  Surgeon: Nelva Bush, MD;  Location: [place] [facility];  Service: Cardiovascular;  Laterality: N/A;   ENDOVASCULAR REPAIR/STENT GRAFT N/A 08/04/2021   Procedure: ENDOVASCULAR REPAIR/STENT GRAFT;  Surgeon: Algernon Huxley, MD;  Location: [place];  Service: Cardiovascular;  Laterality: N/A;   EXPLORATORY LAPAROTOMY  1997   HERNIA REPAIR     KNEE ARTHROPLASTY Left 09/19/2016   Procedure: COMPUTER ASSISTED TOTAL KNEE ARTHROPLASTY;  Surgeon: Dereck Leep, MD;  Location: [facility];  Service: Orthopedics;  Laterality: Left;   TOTAL KNEE ARTHROPLASTY Right 09/2016     Current Outpatient Medications  Medication Sig Dispense Refill   apixaban (ELIQUIS) 5 MG TABS tablet Take 1 tablet (5 mg total) by mouth 2 (two) times daily. 60 tablet 5   aspirin EC 81 MG EC tablet Take 1 tablet (81 mg total) by mouth daily at 6 (six) AM. Swallow whole. 90 tablet 3   buPROPion (WELLBUTRIN SR) 200 MG 12 hr tablet Take 1 tablet (200 mg total) by mouth 2 (two) times daily. 180 tablet 0   Cholecalciferol 50 MCG (2000 UT) CAPS Take 1 capsule by mouth daily.      citalopram (CELEXA) 20 MG tablet Take 1 tablet (20 mg total) by mouth daily. 90 tablet 4   ezetimibe (ZETIA) 10 MG tablet Take 1 tablet (10 mg total) by mouth daily. 90 tablet 3   HYDROcodone-acetaminophen (NORCO/VICODIN) 5-325 MG tablet Take 1 tablet by mouth every 6 (six) hours as needed for moderate pain. 30 tablet 0   levothyroxine (SYNTHROID) 75 MCG tablet Take 1 tablet (75 mcg total) by mouth daily. 90 tablet 3   melatonin 3 MG TABS tablet Take 1 tablet (3 mg total) by mouth at bedtime. 30 tablet 1   metoprolol tartrate (LOPRESSOR) 25  MG tablet Take 0.5 tablets (12.5 mg total) by mouth 2 (two) times daily. 30 tablet 0   omeprazole (PRILOSEC) 20 MG capsule Take 1 capsule (20 mg total) by mouth daily. 90 capsule 0   rosuvastatin (CRESTOR) 40 MG tablet Take 1 tablet (40 mg total) by mouth daily. 90 tablet 3   No current facility-administered medications for this visit.    Allergies:   Benzodiazepines, Codeine, and Tetracycline    Social History:  The patient  reports that he quit smoking about 15 years ago. His smoking use included cigarettes. He has a 35.00 pack-year smoking history. His smokeless tobacco use includes chew. He reports that he does not drink alcohol and does not use drugs.   Family History:  The patient's family history includes Depression in his maternal grandfather and maternal grandmother; Heart attack in his  father, mother, and another family member; Heart disease in his mother and another family member; Hypertension in his father and mother; Kidney disease in his brother.    ROS:  Please see the history of present illness.   Otherwise, review of systems are positive for none.   All other systems are reviewed and negative.    PHYSICAL EXAM: VS:  BP 106/70 (BP Location: Left Arm, Patient Position: Sitting, Cuff Size: Normal)   Pulse 66   Ht 6' (1.829 m)   Wt 214 lb 8 oz (97.3 kg)   SpO2 98%   BMI 29.09 kg/m  , BMI Body mass index is 29.09 kg/m. GEN: Well nourished, well developed, in no acute distress  HEENT: normal  Neck: no JVD, carotid bruits, or masses Cardiac: RRR; no murmurs, rubs, or gallops,no edema  Respiratory:  clear to auscultation bilaterally, normal work of breathing GI: soft, nontender, nondistended, + BS MS: no deformity or atrophy  Skin: warm and dry, no rash Neuro:  Strength and sensation are intact Psych: euthymic mood, full affect   EKG:  EKG is ordered today. The ekg ordered today demonstrates normal sinus rhythm with left anterior fascicular block, LVH with  regularization abnormalities.    Recent Labs: 07/30/2021: ALT 18; TSH 4.070 08/05/2021: BUN 26; Creatinine, Ser 1.06; Hemoglobin 13.3; Platelets 139; Potassium 4.3; Sodium 134    Lipid Panel    Component Value Date/Time   CHOL 127 02/18/2021 0812   CHOL 159 12/18/2015 0827   TRIG 138 02/18/2021 0812   TRIG 166 (H) 12/18/2015 0827   HDL 45 02/18/2021 0812   CHOLHDL 3.4 09/25/2018 1208   VLDL 31 09/25/2018 1208   VLDL 33 (H) 12/18/2015 0827   LDLCALC 58 02/18/2021 0812      Wt Readings from Last 3 Encounters:  09/17/21 214 lb 8 oz (97.3 kg)  09/16/21 217 lb (98.4 kg)  09/06/21 214 lb (97.1 kg)       ASSESSMENT AND PLAN:  1.  Coronary artery disease involving native coronary arteries without angina: He is doing well overall with no anginal symptoms.  Continue medical therapy.  He was placed on low-dose aspirin after his endovascular repair of iliac artery aneurysm.  We will consider stopping this in the next 6 months given that he is on anticoagulation.  2.  Paroxysmal atrial fibrillation: Currently in sinus rhythm.  Continue small dose metoprolol and anticoagulation with Eliquis.  Most recent labs in September were reviewed and showed stable renal function and no evidence of anemia.  3.  Essential hypertension: Blood pressure is controlled on small dose metoprolol.  4. Hyperlipidemia: I reviewed most recent lipid profile done in April which showed an LDL of 58 and triglyceride of 138.  Based on this, recommend continuing same dose of rosuvastatin 40 mg daily.  5. Abdominal aortic aneurysm/iliac artery aneurysm: Status post repair. This is followed by Dr. Lucky Cowboy.     Disposition:   FU with me in 9 months  Signed,  [name], MD  09/17/2021 11:08 AM    [place]

## 2021-09-17 NOTE — Patient Instructions (Addendum)
Medication Instructions:  - Your physician recommends that you continue on your current medications as directed. Please refer to the Current Medication list given to you today.  Samples Given:  Eliquis 5 mg Lot: [id number] Exp: 07/2023 # 2 boxes   You may also call Eliquis 360 support at [phone]) for other cost support options during the donut hole period.    *If you need a refill on your cardiac medications before your next appointment, please call your pharmacy*   Lab Work: - none ordered  If you have labs (blood work) drawn today and your tests are completely normal, you will receive your results only by: [place] (if you have MyChart) OR A paper copy in the mail If you have any lab test that is abnormal or we need to change your treatment, we will call you to review the results.   Testing/Procedures: - none ordered   Follow-Up: At [facility], you and your health needs are our priority.  As part of our continuing mission to provide you with exceptional heart care, we have created designated Provider Care Teams.  These Care Teams include your primary Cardiologist (physician) and Advanced Practice Providers (APPs -  Physician Assistants and Nurse Practitioners) who all work together to provide you with the care you need, when you need it.  We recommend signing up for the patient portal called "MyChart".  Sign up information is provided on this After Visit Summary.  MyChart is used to connect with patients for Virtual Visits (Telemedicine).  Patients are able to view lab/test results, encounter notes, upcoming appointments, etc.  Non-urgent messages can be sent to your provider as well.   To learn more about what you can do with MyChart, go to [email or web address].    Your next appointment:   9 month(s)  The format for your next appointment:   In Person  Provider:   You may see [name], MD or one of the following Advanced Practice Providers on your  designated Care Team:   Murray Hodgkins, NP Christell Faith, PA-C Cadence Kathlen Mody, [place]    Other Instructions N/a

## 2021-09-21 ENCOUNTER — Other Ambulatory Visit: Payer: Medicare HMO

## 2021-09-22 ENCOUNTER — Other Ambulatory Visit: Payer: Medicare HMO

## 2021-09-22 ENCOUNTER — Other Ambulatory Visit: Payer: Self-pay

## 2021-09-22 DIAGNOSIS — R351 Nocturia: Secondary | ICD-10-CM

## 2021-09-22 DIAGNOSIS — N401 Enlarged prostate with lower urinary tract symptoms: Secondary | ICD-10-CM

## 2021-09-22 DIAGNOSIS — N1831 Chronic kidney disease, stage 3a: Secondary | ICD-10-CM

## 2021-09-22 DIAGNOSIS — I1310 Hypertensive heart and chronic kidney disease without heart failure, with stage 1 through stage 4 chronic kidney disease, or unspecified chronic kidney disease: Secondary | ICD-10-CM

## 2021-09-22 DIAGNOSIS — E039 Hypothyroidism, unspecified: Secondary | ICD-10-CM

## 2021-09-22 DIAGNOSIS — E782 Mixed hyperlipidemia: Secondary | ICD-10-CM

## 2021-09-22 DIAGNOSIS — I10 Essential (primary) hypertension: Secondary | ICD-10-CM

## 2021-09-23 LAB — COMPREHENSIVE METABOLIC PANEL
ALT: 24 IU/L (ref 0–44)
AST: 37 IU/L (ref 0–40)
Albumin/Globulin Ratio: 1.9 (ref 1.2–2.2)
Albumin: 4.3 g/dL (ref 3.8–4.8)
Alkaline Phosphatase: 116 IU/L (ref 44–121)
BUN/Creatinine Ratio: 17 (ref 10–24)
BUN: 21 mg/dL (ref 8–27)
Bilirubin Total: 0.4 mg/dL (ref 0.0–1.2)
CO2: 23 mmol/L (ref 20–29)
Calcium: 9.2 mg/dL (ref 8.6–10.2)
Chloride: 105 mmol/L (ref 96–106)
Creatinine, Ser: 1.22 mg/dL (ref 0.76–1.27)
Globulin, Total: 2.3 g/dL (ref 1.5–4.5)
Glucose: 105 mg/dL — ABNORMAL HIGH (ref 70–99)
Potassium: 4.6 mmol/L (ref 3.5–5.2)
Sodium: 141 mmol/L (ref 134–144)
Total Protein: 6.6 g/dL (ref 6.0–8.5)
eGFR: 64 mL/min/{1.73_m2} (ref >59)

## 2021-09-23 LAB — CBC WITH DIFFERENTIAL/PLATELET
Basophils Absolute: 0.1 10*3/uL (ref 0.0–0.2)
Basos: 1 %
EOS (ABSOLUTE): 0.5 10*3/uL — ABNORMAL HIGH (ref 0.0–0.4)
Eos: 6 %
Hematocrit: 44.7 % (ref 37.5–51.0)
Hemoglobin: 15 g/dL (ref 13.0–17.7)
Immature Grans (Abs): 0 10*3/uL (ref 0.0–0.1)
Immature Granulocytes: 0 %
Lymphocytes Absolute: 4.3 10*3/uL — ABNORMAL HIGH (ref 0.7–3.1)
Lymphs: 53 %
MCH: 30.3 pg (ref 26.6–33.0)
MCHC: 33.6 g/dL (ref 31.5–35.7)
MCV: 90 fL (ref 79–97)
Monocytes Absolute: 0.5 10*3/uL (ref 0.1–0.9)
Monocytes: 6 %
Neutrophils Absolute: 2.8 10*3/uL (ref 1.4–7.0)
Neutrophils: 34 %
Platelets: 165 10*3/uL (ref 150–450)
RBC: 4.95 x10E6/uL (ref 4.14–5.80)
RDW: 13 % (ref 11.6–15.4)
WBC: 8.1 10*3/uL (ref 3.4–10.8)

## 2021-09-23 LAB — LIPID PANEL
Chol/HDL Ratio: 3.1 ratio (ref 0.0–5.0)
Cholesterol, Total: 145 mg/dL (ref 100–199)
HDL: 47 mg/dL (ref >39)
LDL Chol Calc (NIH): 77 mg/dL (ref 0–99)
Triglycerides: 114 mg/dL (ref 0–149)
VLDL Cholesterol Cal: 21 mg/dL (ref 5–40)

## 2021-09-23 LAB — TSH: TSH: 2.79 u[IU]/mL (ref 0.450–4.500)

## 2021-09-23 LAB — PSA: Prostate Specific Ag, Serum: 1.3 ng/mL (ref 0.0–4.0)

## 2021-09-29 ENCOUNTER — Ambulatory Visit (INDEPENDENT_AMBULATORY_CARE_PROVIDER_SITE_OTHER): Payer: Medicare HMO | Admitting: Internal Medicine

## 2021-09-29 ENCOUNTER — Other Ambulatory Visit: Payer: Self-pay

## 2021-09-29 ENCOUNTER — Other Ambulatory Visit: Payer: Self-pay | Admitting: Cardiovascular Disease

## 2021-09-29 ENCOUNTER — Encounter: Payer: Self-pay | Admitting: Internal Medicine

## 2021-09-29 VITALS — BP 123/85 | HR 76 | Temp 97.9°F | Ht 72.05 in | Wt 216.8 lb

## 2021-09-29 DIAGNOSIS — E039 Hypothyroidism, unspecified: Secondary | ICD-10-CM

## 2021-09-29 DIAGNOSIS — I25810 Atherosclerosis of coronary artery bypass graft(s) without angina pectoris: Secondary | ICD-10-CM | POA: Diagnosis not present

## 2021-09-29 DIAGNOSIS — F325 Major depressive disorder, single episode, in full remission: Secondary | ICD-10-CM

## 2021-09-29 DIAGNOSIS — Z23 Encounter for immunization: Secondary | ICD-10-CM | POA: Diagnosis not present

## 2021-09-29 DIAGNOSIS — Z1211 Encounter for screening for malignant neoplasm of colon: Secondary | ICD-10-CM | POA: Insufficient documentation

## 2021-09-29 DIAGNOSIS — I480 Paroxysmal atrial fibrillation: Secondary | ICD-10-CM

## 2021-09-29 NOTE — Patient Instructions (Signed)
Synthroid / levothyroxine :  PLEASE TAKE YOUR THYROID MEDICATION FIRST THING IN THE MORNING WHILST FASTING.  NO MEDICATION/ FOOD FOR AN HOUR AFTER INGESTING THYROID PILLS.

## 2021-09-29 NOTE — Progress Notes (Signed)
BP 123/85   Pulse 76   Temp 97.9 F (36.6 C) (Oral)   Ht 6' 0.05" (1.83 m)   Wt 216 lb 12.8 oz (98.3 kg)   SpO2 97%   BMI 29.37 kg/m    Subjective:    Patient ID: Stuart Harvey, male    DOB: 1951/09/26, 70 y.o.   MRN: [id number]  Chief Complaint  Patient presents with  . Hypertension  . Hyperlipidemia  . Depression    HPI: Stuart Harvey is a 70 y.o. male  Hypertension This is a chronic problem. Pertinent negatives include no chest pain, headaches, palpitations or shortness of breath.  Hyperlipidemia This is a chronic problem. The problem is controlled. Pertinent negatives include no chest pain, myalgias or shortness of breath.  Depression        This is a chronic problem.  Associated symptoms include no decreased concentration, no fatigue, no appetite change, no myalgias, no headaches and no suicidal ideas.  Chief Complaint  Patient presents with  . Hypertension  . Hyperlipidemia  . Depression    Relevant past medical, surgical, family and social history reviewed and updated as indicated. Interim medical history since our last visit reviewed. Allergies and medications reviewed and updated.  Review of Systems  Constitutional:  Negative for activity change, appetite change, chills, fatigue and fever.  HENT:  Negative for congestion, ear discharge, ear pain and facial swelling.   Eyes:  Negative for pain, discharge and itching.  Respiratory:  Negative for cough, chest tightness, shortness of breath and wheezing.   Cardiovascular:  Negative for chest pain, palpitations and leg swelling.  Gastrointestinal:  Negative for abdominal distention, abdominal pain, blood in stool, constipation, diarrhea, nausea and vomiting.  Endocrine: Negative for cold intolerance, heat intolerance, polydipsia, polyphagia and polyuria.  Genitourinary:  Negative for difficulty urinating, dysuria, flank pain, frequency, hematuria and urgency.  Musculoskeletal:  Negative for arthralgias,  gait problem, joint swelling and myalgias.  Skin:  Negative for color change, rash and wound.  Neurological:  Negative for dizziness, tremors, speech difficulty, weakness, light-headedness, numbness and headaches.  Hematological:  Does not bruise/bleed easily.  Psychiatric/Behavioral:  Positive for depression. Negative for agitation, confusion, decreased concentration, sleep disturbance and suicidal ideas.    Per HPI unless specifically indicated above     Objective:    BP 123/85   Pulse 76   Temp 97.9 F (36.6 C) (Oral)   Ht 6' 0.05" (1.83 m)   Wt 216 lb 12.8 oz (98.3 kg)   SpO2 97%   BMI 29.37 kg/m   Wt Readings from Last 3 Encounters:  09/29/21 216 lb 12.8 oz (98.3 kg)  09/17/21 214 lb 8 oz (97.3 kg)  09/16/21 217 lb (98.4 kg)    Physical Exam Vitals and nursing note reviewed.  Constitutional:      General: He is not in acute distress.    Appearance: Normal appearance. He is not ill-appearing or diaphoretic.  HENT:     Head: Normocephalic and atraumatic.     Right Ear: Tympanic membrane and external ear normal. There is no impacted cerumen.     Left Ear: External ear normal.     Nose: No congestion or rhinorrhea.     Mouth/Throat:     Pharynx: No oropharyngeal exudate or posterior oropharyngeal erythema.  Eyes:     Conjunctiva/sclera: Conjunctivae normal.     Pupils: Pupils are equal, round, and reactive to light.  Cardiovascular:     Rate and Rhythm: Normal rate and  regular rhythm.     Heart sounds: No murmur heard.   No friction rub. No gallop.  Pulmonary:     Effort: No respiratory distress.     Breath sounds: No stridor. No wheezing or rhonchi.  Chest:     Chest wall: No tenderness.  Abdominal:     General: Abdomen is flat. Bowel sounds are normal.     Palpations: Abdomen is soft. There is no mass.     Tenderness: There is no abdominal tenderness. There is right CVA tenderness.  Musculoskeletal:     Cervical back: Normal range of motion and neck supple. No  rigidity or tenderness.     Right lower leg: No edema.     Left lower leg: No edema.  Skin:    General: Skin is warm and dry.  Neurological:     Mental Status: He is alert.  Psychiatric:        Mood and Affect: Mood normal.        Behavior: Behavior normal.        Thought Content: Thought content normal.        Judgment: Judgment normal.    Results for orders placed or performed in visit on 09/22/21  PSA  Result Value Ref Range   Prostate Specific Ag, Serum 1.3 0.0 - 4.0 ng/mL  TSH  Result Value Ref Range   TSH 2.790 0.450 - 4.500 uIU/mL  Comprehensive metabolic panel  Result Value Ref Range   Glucose 105 (H) 70 - 99 mg/dL   BUN 21 8 - 27 mg/dL   Creatinine, Ser 1.22 0.76 - 1.27 mg/dL   eGFR 64 >59 mL/min/1.73   BUN/Creatinine Ratio 17 10 - 24   Sodium 141 134 - 144 mmol/L   Potassium 4.6 3.5 - 5.2 mmol/L   Chloride 105 96 - 106 mmol/L   CO2 23 20 - 29 mmol/L   Calcium 9.2 8.6 - 10.2 mg/dL   Total Protein 6.6 6.0 - 8.5 g/dL   Albumin 4.3 3.8 - 4.8 g/dL   Globulin, Total 2.3 1.5 - 4.5 g/dL   Albumin/Globulin Ratio 1.9 1.2 - 2.2   Bilirubin Total 0.4 0.0 - 1.2 mg/dL   Alkaline Phosphatase 116 44 - 121 IU/L   AST 37 0 - 40 IU/L   ALT 24 0 - 44 IU/L  CBC w/Diff  Result Value Ref Range   WBC 8.1 3.4 - 10.8 x10E3/uL   RBC 4.95 4.14 - 5.80 x10E6/uL   Hemoglobin 15.0 13.0 - 17.7 g/dL   Hematocrit 44.7 37.5 - 51.0 %   MCV 90 79 - 97 fL   MCH 30.3 26.6 - 33.0 pg   MCHC 33.6 31.5 - 35.7 g/dL   RDW 13.0 11.6 - 15.4 %   Platelets 165 150 - 450 x10E3/uL   Neutrophils 34 Not Estab. %   Lymphs 53 Not Estab. %   Monocytes 6 Not Estab. %   Eos 6 Not Estab. %   Basos 1 Not Estab. %   Neutrophils Absolute 2.8 1.4 - 7.0 x10E3/uL   Lymphocytes Absolute 4.3 (H) 0.7 - 3.1 x10E3/uL   Monocytes Absolute 0.5 0.1 - 0.9 x10E3/uL   EOS (ABSOLUTE) 0.5 (H) 0.0 - 0.4 x10E3/uL   Basophils Absolute 0.1 0.0 - 0.2 x10E3/uL   Immature Granulocytes 0 Not Estab. %   Immature Grans (Abs) 0.0  0.0 - 0.1 x10E3/uL  Lipid panel  Result Value Ref Range   Cholesterol, Total 145 100 - 199 mg/dL  Triglycerides 114 0 - 149 mg/dL   HDL 47 >39 mg/dL   VLDL Cholesterol Cal 21 5 - 40 mg/dL   LDL Chol Calc (NIH) 77 0 - 99 mg/dL   Chol/HDL Ratio 3.1 0.0 - 5.0 ratio        Current Outpatient Medications:  .  apixaban (ELIQUIS) 5 MG TABS tablet, Take 1 tablet (5 mg total) by mouth 2 (two) times daily., Disp: 60 tablet, Rfl: 5 .  aspirin EC 81 MG EC tablet, Take 1 tablet (81 mg total) by mouth daily at 6 (six) AM. Swallow whole., Disp: 90 tablet, Rfl: 3 .  buPROPion (WELLBUTRIN SR) 200 MG 12 hr tablet, Take 1 tablet (200 mg total) by mouth 2 (two) times daily., Disp: 180 tablet, Rfl: 0 .  Cholecalciferol 50 MCG (2000 UT) CAPS, Take 1 capsule by mouth daily. , Disp: , Rfl:  .  citalopram (CELEXA) 20 MG tablet, Take 1 tablet (20 mg total) by mouth daily., Disp: 90 tablet, Rfl: 4 .  ezetimibe (ZETIA) 10 MG tablet, Take 1 tablet (10 mg total) by mouth daily., Disp: 90 tablet, Rfl: 3 .  HYDROcodone-acetaminophen (NORCO/VICODIN) 5-325 MG tablet, Take 1 tablet by mouth every 6 (six) hours as needed for moderate pain., Disp: 30 tablet, Rfl: 0 .  levothyroxine (SYNTHROID) 75 MCG tablet, Take 1 tablet (75 mcg total) by mouth daily., Disp: 90 tablet, Rfl: 3 .  melatonin 3 MG TABS tablet, Take 1 tablet (3 mg total) by mouth at bedtime., Disp: 30 tablet, Rfl: 1 .  metoprolol tartrate (LOPRESSOR) 25 MG tablet, Take 0.5 tablets (12.5 mg total) by mouth 2 (two) times daily., Disp: 30 tablet, Rfl: 7 .  omeprazole (PRILOSEC) 20 MG capsule, Take 1 capsule (20 mg total) by mouth daily., Disp: 90 capsule, Rfl: 0 .  rosuvastatin (CRESTOR) 40 MG tablet, Take 1 tablet (40 mg total) by mouth daily., Disp: 90 tablet, Rfl: 3    Assessment & Plan:   MCHC 33.9 30.0 - 36.0 g/dL     RDW 13.8 11.5 - 15.5 %    Platelets 162 150 - 400 K/uL    nRBC 0.0 0.0 - 0.2 %    Neutrophils Relative % 45 %    Neutro Abs 4.0 1.7 -  7.7 K/uL    Lymphocytes Relative 46 %    Lymphs Abs 4.1 (H) 0.7 - 4.0 K/uL    Monocytes Relative 5 %    Monocytes Absolute 0.5 0.1 - 1.0 K/uL    Eosinophils Relative 4 %    Eosinophils Absolute 0.4 0.0 - 0.5 K/uL    Basophils Relative 0 %    Basophils Absolute 0.0 0.0 - 0.1 K/uL    Immature Granulocytes 0 %    Abs Immature Granulocytes 0.03 0.00 - 0.07 K/uL  Basic metabolic panel  Result Value Ref Range    Sodium 139 135 - 145 mmol/L    Potassium 4.3 3.5 - 5.1 mmol/L    Chloride 111 98 - 111 mmol/L    CO2 23 22 - 32 mmol/L    Glucose, Bld 67 (L) 70 - 99 mg/dL    BUN 44 (H) 8 - 23 mg/dL    Creatinine, Ser 1.53 (H) 0.61 - 1.24 mg/dL    Calcium 9.2 8.9 - 10.3 mg/dL    GFR, Estimated 49 (L) >60 mL/min    Anion gap 5 5 - 15  Type and screen  Result Value Ref Range    ABO/RH(D) O NEG  Antibody Screen NEG      Sample Expiration 08/06/2021,2359      Extend sample reason          NO TRANSFUSIONS OR PREGNANCY IN THE PAST 3 MONTHS Performed at [facility], [address], [place], [place] [address]              Current Outpatient Medications:  .  buPROPion (WELLBUTRIN SR) 200 MG 12 hr tablet, Take 1 tablet (200 mg total) by mouth 2 (two) times daily., Disp: 180 tablet, Rfl: 0 .  Cholecalciferol 50 MCG (2000 UT) CAPS, Take 1 capsule by mouth daily. , Disp: , Rfl:  .  citalopram (CELEXA) 20 MG tablet, Take 1 tablet (20 mg total) by mouth daily., Disp: 90 tablet, Rfl: 4 .  ELIQUIS 5 MG TABS tablet, Take 1 tablet (5 mg total) by mouth 2 (two) times daily., Disp: 180 tablet, Rfl: 0 .  ezetimibe (ZETIA) 10 MG tablet, Take 1 tablet (10 mg total) by mouth daily., Disp: 90 tablet, Rfl: 3 .  levothyroxine (SYNTHROID) 75 MCG tablet, Take 1 tablet (75 mcg total) by mouth daily., Disp: 90 tablet, Rfl: 3 .  metoprolol tartrate (LOPRESSOR) 25 MG tablet, Take 0.5 tablets (12.5 mg total) by mouth 2 (two) times daily. Please contact office to schedule follow up visit., Disp: 90 tablet,  Rfl: 0 .  omeprazole (PRILOSEC) 20 MG capsule, Take 1 capsule (20 mg total) by mouth daily., Disp: 90 capsule, Rfl: 0 .  rosuvastatin (CRESTOR) 40 MG tablet, Take 1 tablet (40 mg total) by mouth daily., Disp: 90 tablet, Rfl: 3      Assessment & Plan:  Hypothyroidism /: Is on 75 mcg of synthroid plASE TAKE YOUR THYROID MEDICATION FIRST THING IN THE MORNING WHILST FASTING.  NO MEDICATION/ FOOD FOR AN HOUR AFTER INGESTING THYROID PILLS.    HLD is on zetia and crestor recheck FLP, check LFT's work on diet, SE of meds explained to pt. low fat and high fiber diet explained to pt.   Htn is on lopressor 25 mg daily. Continue current meds.  Medication compliance emphasised. pt advised to keep Bp logs. Pt verbalised understanding of the same. Pt to have a low salt diet . Exercise to reach a goal of at least 150 mins a week.  lifestyle modifications explained and pt understands importance of the above.   4. Insomnia : is on melatonin 3 mg for such helping per pt    5.  Depression : Is on celexa and wellbutrin for such  Has been on this for a while now.  Consider stopping welbutrin ? Needs this  Says he isnt depressed any more   6. Afib : is on eliquis and lopressor Sees Dr. Fletcher Anon for such Cards fu and mx per cards. Has a ho severe three-vessel coronary artery disease diagnosed in 2008 as well as a large infrarenal abdominal aortic aneurysm.   He underwent open surgical repair of aortic aneurysm in October of 2008.        Problem List Items Addressed This Visit   None    No orders of the defined types were placed in this encounter.    No orders of the defined types were placed in this encounter.    Follow up plan: No follow-ups on file.  Health Maintenance : PSA :  Cscope : not had one in 10 yrs.  Pneumonia vaccine : prenvanar / pneumovax  FLu vaccine : declined.  Labs next visit : CBC, CMP,  FLP, TSH, PSA Labs 1 week prior to next visit.

## 2021-10-04 ENCOUNTER — Telehealth: Payer: Self-pay | Admitting: Cardiovascular Disease

## 2021-10-04 NOTE — Telephone Encounter (Signed)
Eliquis 5 mg refill request received. Patient is 70 years old, weight- 98.3 kg, Crea-1.22 on 09/22/21, Diagnosis-PAF, and last seen by Dr. Fletcher Anon on 09/17/21. Dose is appropriate based on dosing criteria.

## 2021-10-04 NOTE — Telephone Encounter (Signed)
Patient calling the office for samples of medication:   1.  What medication and dosage are you requesting samples for?    ELIQUIS 5 MG PO BID   2.  Are you currently out of this medication? NO WILL BE OUT AT END OF WEEK NOW IN DONUT HOLE CANNOT AFFORD

## 2021-10-04 NOTE — Telephone Encounter (Signed)
I spoke with pt this am to discuss applying for pt assistance to help assist him with donut hole. Pt was appreciative for applications and co-pay card. Pt will attempt to use coupon cards and fill out applications. Placed 2 boxes upfront for pick up.  Eliquis [id number] Exp:04/2023

## 2021-10-04 NOTE — Telephone Encounter (Signed)
Refill Request.  

## 2021-10-05 ENCOUNTER — Other Ambulatory Visit: Payer: Self-pay

## 2021-10-05 DIAGNOSIS — Z1211 Encounter for screening for malignant neoplasm of colon: Secondary | ICD-10-CM

## 2021-10-05 MED ORDER — NA SULFATE-K SULFATE-MG SULF 17.5-3.13-1.6 GM/177ML PO SOLN: 1.0000 | Freq: Once | ORAL | 0 refills | Status: AC

## 2021-10-05 NOTE — Progress Notes (Signed)
Gastroenterology Pre-Procedure Review  Request Date: 11/17/2021 Requesting Physician: Dr. Marius Ditch  PATIENT REVIEW QUESTIONS: The patient responded to the following health history questions as indicated:    1. Are you having any GI issues? no 2. Do you have a personal history of Polyps? no 3. Do you have a family history of Colon Cancer or Polyps? no 4. Diabetes Mellitus? no 5. Joint replacements in the past 12 months?no 6. Major health problems in the past 3 months?yes (lliac aneurysm) 7. Any artificial heart valves, MVP, or defibrillator?no    MEDICATIONS & ALLERGIES:    Patient reports the following regarding taking any anticoagulation/antiplatelet therapy:   Plavix, Coumadin, Eliquis, Xarelto, Lovenox, Pradaxa, Brilinta, or Effient? yes (Eliquis 5 mg) Aspirin? yes (81 mg)  Patient confirms/reports the following medications:  Current Outpatient Medications  Medication Sig Dispense Refill   apixaban (ELIQUIS) 5 MG TABS tablet Take 1 tablet (5 mg total) by mouth 2 (two) times daily. 60 tablet 5   aspirin EC 81 MG EC tablet Take 1 tablet (81 mg total) by mouth daily at 6 (six) AM. Swallow whole. 90 tablet 3   buPROPion (WELLBUTRIN SR) 200 MG 12 hr tablet Take 1 tablet (200 mg total) by mouth 2 (two) times daily. 180 tablet 0   Cholecalciferol 50 MCG (2000 UT) CAPS Take 1 capsule by mouth daily.      citalopram (CELEXA) 20 MG tablet Take 1 tablet (20 mg total) by mouth daily. 90 tablet 4   ezetimibe (ZETIA) 10 MG tablet Take 1 tablet (10 mg total) by mouth daily. 90 tablet 3   HYDROcodone-acetaminophen (NORCO/VICODIN) 5-325 MG tablet Take 1 tablet by mouth every 6 (six) hours as needed for moderate pain. 30 tablet 0   levothyroxine (SYNTHROID) 75 MCG tablet Take 1 tablet (75 mcg total) by mouth daily. 90 tablet 3   melatonin 3 MG TABS tablet Take 1 tablet (3 mg total) by mouth at bedtime. 30 tablet 1   metoprolol tartrate (LOPRESSOR) 25 MG tablet Take 0.5 tablets (12.5 mg total) by mouth  2 (two) times daily. 30 tablet 7   omeprazole (PRILOSEC) 20 MG capsule Take 1 capsule (20 mg total) by mouth daily. 90 capsule 0   rosuvastatin (CRESTOR) 40 MG tablet Take 1 tablet (40 mg total) by mouth daily. 90 tablet 3   No current facility-administered medications for this visit.    Patient confirms/reports the following allergies:  Allergies  Allergen Reactions   Benzodiazepines     Paradoxical agitation and delirium   Codeine Nausea Only   Tetracycline Rash    No orders of the defined types were placed in this encounter.   AUTHORIZATION INFORMATION Primary Insurance: 1D#: Group #:  Secondary Insurance: 1D#: Group #:  SCHEDULE INFORMATION: Date: 11/17/2021   Time: Location: [place]

## 2021-10-11 NOTE — Telephone Encounter (Signed)
Called patient to inform him of blood thinner clearance. No answer unable to leave message. Will send my chart message.

## 2021-10-11 NOTE — Progress Notes (Signed)
The patient may stop Eliquis 2 days before the procedure and should resume the day after the procedure if no bleeding issues.

## 2021-10-11 NOTE — Telephone Encounter (Signed)
-----   Message from Wellington Hampshire, MD sent at 10/11/2021  8:20 AM EST -----    ----- Message ----- From: Storm Frisk, CMA Sent: 10/05/2021   9:06 AM EST To: Wellington Hampshire, MD

## 2021-10-28 ENCOUNTER — Other Ambulatory Visit: Payer: Self-pay | Admitting: Nurse Practitioner

## 2021-10-29 NOTE — Telephone Encounter (Signed)
Requested Prescriptions  Pending Prescriptions Disp Refills   buPROPion (WELLBUTRIN SR) 200 MG 12 hr tablet [Pharmacy Med Name: BUPROPION HCL SR 200 MG TABLET] 180 tablet 1    Sig: Take 1 tablet (200 mg total) by mouth 2 (two) times daily.     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

## 2021-11-17 ENCOUNTER — Ambulatory Visit: Payer: Medicare HMO | Admitting: Certified Registered"

## 2021-11-17 ENCOUNTER — Encounter: Payer: Self-pay | Admitting: Gastroenterology

## 2021-11-17 ENCOUNTER — Other Ambulatory Visit: Payer: Self-pay

## 2021-11-17 ENCOUNTER — Encounter
Admission: RE | Disposition: A | Discharge status: Home / Self Care | Payer: Self-pay | Source: Home / Self Care | Attending: Gastroenterology

## 2021-11-17 ENCOUNTER — Ambulatory Visit
Admission: RE | Admit: 2021-11-17 | Discharge: 2021-11-17 | Disposition: A | Discharge status: Home / Self Care | Payer: Medicare HMO | Attending: Gastroenterology | Admitting: Gastroenterology

## 2021-11-17 DIAGNOSIS — I2510 Atherosclerotic heart disease of native coronary artery without angina pectoris: Secondary | ICD-10-CM | POA: Insufficient documentation

## 2021-11-17 DIAGNOSIS — Z7901 Long term (current) use of anticoagulants: Secondary | ICD-10-CM | POA: Insufficient documentation

## 2021-11-17 DIAGNOSIS — E785 Hyperlipidemia, unspecified: Secondary | ICD-10-CM | POA: Diagnosis not present

## 2021-11-17 DIAGNOSIS — K621 Rectal polyp: Secondary | ICD-10-CM | POA: Diagnosis not present

## 2021-11-17 DIAGNOSIS — Z86010 Personal history of colonic polyps: Secondary | ICD-10-CM | POA: Insufficient documentation

## 2021-11-17 DIAGNOSIS — Z951 Presence of aortocoronary bypass graft: Secondary | ICD-10-CM | POA: Insufficient documentation

## 2021-11-17 DIAGNOSIS — K219 Gastro-esophageal reflux disease without esophagitis: Secondary | ICD-10-CM | POA: Insufficient documentation

## 2021-11-17 DIAGNOSIS — E039 Hypothyroidism, unspecified: Secondary | ICD-10-CM | POA: Diagnosis not present

## 2021-11-17 DIAGNOSIS — Z1211 Encounter for screening for malignant neoplasm of colon: Secondary | ICD-10-CM | POA: Diagnosis not present

## 2021-11-17 DIAGNOSIS — Z8616 Personal history of COVID-19: Secondary | ICD-10-CM | POA: Insufficient documentation

## 2021-11-17 DIAGNOSIS — I129 Hypertensive chronic kidney disease with stage 1 through stage 4 chronic kidney disease, or unspecified chronic kidney disease: Secondary | ICD-10-CM | POA: Insufficient documentation

## 2021-11-17 DIAGNOSIS — K5730 Diverticulosis of large intestine without perforation or abscess without bleeding: Secondary | ICD-10-CM | POA: Diagnosis not present

## 2021-11-17 DIAGNOSIS — D128 Benign neoplasm of rectum: Secondary | ICD-10-CM

## 2021-11-17 DIAGNOSIS — N182 Chronic kidney disease, stage 2 (mild): Secondary | ICD-10-CM | POA: Diagnosis not present

## 2021-11-17 HISTORY — PX: COLONOSCOPY WITH PROPOFOL: SHX5780

## 2021-11-17 SURGERY — COLONOSCOPY WITH PROPOFOL
Anesthesia: General

## 2021-11-17 MED ORDER — LIDOCAINE HCL (CARDIAC) PF 100 MG/5ML IV SOSY
PREFILLED_SYRINGE | INTRAVENOUS | Status: DC | PRN
  Administered 2021-11-17: 50 mg via INTRAVENOUS

## 2021-11-17 MED ORDER — PROPOFOL 500 MG/50ML IV EMUL
INTRAVENOUS | Status: DC | PRN
  Administered 2021-11-17: 150 ug/kg/min via INTRAVENOUS

## 2021-11-17 MED ORDER — SODIUM CHLORIDE 0.9 % IV SOLN: INTRAVENOUS | Status: DC

## 2021-11-17 MED ORDER — PROPOFOL 500 MG/50ML IV EMUL
INTRAVENOUS | Status: AC
  Filled 2021-11-17: qty 50

## 2021-11-17 MED ORDER — PROPOFOL 10 MG/ML IV BOLUS
INTRAVENOUS | Status: DC | PRN
Start: 2021-11-17 — End: 2021-11-17
  Administered 2021-11-17: 100 mg via INTRAVENOUS

## 2021-11-17 NOTE — Op Note (Signed)
[facility] Gastroenterology Patient Name: Stuart Harvey Procedure Date: 11/17/2021 9:45 AM MRN: [id number] Account #: [id number] Date of Birth: [birth year] Admit Type: Outpatient Age: 71 Room: [facility] ENDO ROOM 3 Gender: Male Note Status: Finalized Instrument Name: Jasper Riling [id number] Procedure:             Colonoscopy Indications:           High risk colon cancer surveillance: Personal history                         of sessile serrated colon polyp (less than 10 mm in                         size) with no dysplasia, Last colonoscopy: October 2018 Providers:             Lin Landsman MD, MD Referring MD:          Charlynne Cousins (Referring MD) Medicines:             General Anesthesia Complications:         No immediate complications. Estimated blood loss: None. Procedure:             Pre-Anesthesia Assessment:                        - Prior to the procedure, a History and Physical was                         performed, and patient medications and allergies were                         reviewed. The patient is competent. The risks and                         benefits of the procedure and the sedation options and                         risks were discussed with the patient. All questions                         were answered and informed consent was obtained.                         Patient identification and proposed procedure were                         verified by the physician, the nurse, the                         anesthesiologist, the anesthetist and the technician                         in the pre-procedure area in the procedure room in the                         endoscopy suite. Mental Status Examination: alert and                         oriented. Airway Examination: normal oropharyngeal  airway and neck mobility. Respiratory Examination:                         clear to auscultation. CV Examination: RRR, no                          murmurs, no S3 or S4. Prophylactic Antibiotics: The                         patient does not require prophylactic antibiotics.                         Prior Anticoagulants: The patient has taken Eliquis                         (apixaban), last dose was 3 days prior to procedure.                         ASA Grade Assessment: III - A patient with severe                         systemic disease. After reviewing the risks and                         benefits, the patient was deemed in satisfactory                         condition to undergo the procedure. The anesthesia                         plan was to use general anesthesia. Immediately prior                         to administration of medications, the patient was                         re-assessed for adequacy to receive sedatives. The                         heart rate, respiratory rate, oxygen saturations,                         blood pressure, adequacy of pulmonary ventilation, and                         response to care were monitored throughout the                         procedure. The physical status of the patient was                         re-assessed after the procedure.                        After obtaining informed consent, the colonoscope was                         passed under direct vision. Throughout the procedure,  the patient's blood pressure, pulse, and oxygen                         saturations were monitored continuously. The                         Colonoscope was introduced through the anus and                         advanced to the the cecum, identified by appendiceal                         orifice and ileocecal valve. The colonoscopy was                         performed without difficulty. The patient tolerated                         the procedure well. The quality of the bowel                         preparation was evaluated using the BBPS [facility] Bowel                          Preparation Scale) with scores of: Right Colon = 3,                         Transverse Colon = 3 and Left Colon = 3 (entire mucosa                         seen well with no residual staining, small fragments                         of stool or opaque liquid). The total BBPS score                         equals 9. Findings:      The perianal and digital rectal examinations were normal. Pertinent       negatives include normal sphincter tone and no palpable rectal lesions.      Two sessile polyps were found in the rectum. The polyps were 4 to 5 mm       in size. These polyps were removed with a cold snare. Resection and       retrieval were complete. Estimated blood loss: none.      A few diverticula were found in the recto-sigmoid colon and sigmoid       colon.      The retroflexed view of the distal rectum and anal verge was normal and       showed no anal or rectal abnormalities.      The exam was otherwise without abnormality. Impression:            - Two 4 to 5 mm polyps in the rectum, removed with a                         cold snare. Resected and retrieved.                        -  Diverticulosis in the recto-sigmoid colon and in the                         sigmoid colon.                        - The distal rectum and anal verge are normal on                         retroflexion view.                        - The examination was otherwise normal. Recommendation:        - Discharge patient to home (with escort).                        - Resume previous diet today.                        - Continue present medications.                        - Resume Eliquis (apixaban) today at prior dose. Refer                         to managing physician for further adjustment of                         therapy.                        - Await pathology results.                        - Repeat colonoscopy in 5 years for surveillance. Procedure Code(s):     --- Professional ---                         [id number], Colonoscopy, flexible; with removal of                         tumor(s), polyp(s), or other lesion(s) by snare                         technique Diagnosis Code(s):     --- Professional ---                        K62.1, Rectal polyp                        K57.30, Diverticulosis of large intestine without                         perforation or abscess without bleeding                        Z86.010, Personal history of colonic polyps CPT copyright 2019 American Medical Association. All rights reserved. The codes documented in this report are preliminary and upon coder review may  be revised to meet current compliance requirements. Dr. Ulyess Mort Lin Landsman MD, MD 11/17/2021 10:19:47 AM This report has been  signed electronically. Number of Addenda: 0 Note Initiated On: 11/17/2021 9:45 AM Scope Withdrawal Time: 0 hours 12 minutes 11 seconds  Total Procedure Duration: 0 hours 15 minutes 25 seconds  Estimated Blood Loss:  Estimated blood loss: none.      [facility]

## 2021-11-17 NOTE — H&P (Signed)
Cephas Darby, MD [address]  [place]  [place], [place] [address]  Main: [phone]  Fax: [phone] Pager: [phone]  Primary Care Physician:  Charlynne Cousins, MD Primary Gastroenterologist:  Dr. Cephas Darby  Pre-Procedure History & Physical: HPI:  Stuart Harvey is a 71 y.o. male is here for an colonoscopy.   Past Medical History:  Diagnosis Date   AAA (abdominal aortic aneurysm)    a.) s/p EVAR in 08/2007 by Dr. Hulda Humphrey. b.) CTA on 06/30/2021 --> interval aneurysmal dilitation superior to stent graft with the juxtarenal aorta measuring 3.8 cm.   Anemia    Arthritis    both knees   Chronic anticoagulation    Apixaban   CKD (chronic kidney disease), stage II    Coronary artery disease    a.) 4v CABG @ [facility] 06/2007 (LIMA-LAD, VG-RPDA, VG-OM3,VG-D2); b.) LHC 2011: patent grafts EF 40%, c.) LHC 06/05/18: LM nl, pLAD 50%, p-mLAD 50%, ostD1 70%, ost ramus 30%, ost-pLCx 40%, pLCx 100% (chronically occluded), OM3 100%, p-dRCA 100% (chronically occluded), LIMA-LAD patent, VG-RPDA patent, VG-OM3 mid-graft 60% and 80%   Depression    GERD (gastroesophageal reflux disease)    Hernia of abdominal cavity    History of 2019 novel coronavirus disease (COVID-19) 09/2019   Hyperlipidemia    Hypertension    Hypothyroidism    Iliac artery aneurysm, right ([place])    a.) at the distal landing zone of the right iliac limb measuring 3 cm   Right middle lobe pulmonary nodule 06/30/2021   a.) measured 8 mm by CT on 06/30/2021.   S/P CABG x 4 06/25/2007   a.) LIMA-LAD, SVG-RPDA, SVG-OM3, SVG-D2   Sigmoid diverticulosis    Ventricular tachycardia 05/2018   a.) found unresponsive by EMS; VT noted --> defibrillation achieved ROSC. Briefly intubated. Episode felt to be secondary to heat stoke.    Past Surgical History:  Procedure Laterality Date   ABDOMINAL AORTIC ANEURYSM REPAIR N/A 08/29/2007   Procedure: EVAR; Location: [place]; Surgeon: Collene Schlichter, MD   ABDOMINAL AORTIC ANEURYSM  REPAIR N/A 12/05/2012   Procedure: Abdominal aortic aneurysm of approximately 6 cm in maximal diameter status post previous endovascular repair with type I and III endoleaks; Location: [place]; Surgeon: Leotis Pain, MD   CARDIAC CATHETERIZATION Left 06/28/2010   3v CAD with patent CABG grafts; LVEF 40%; stable aortic stent graft; Location: [place]; Surgeon: Serafina Royals, MD   CARDIAC CATHETERIZATION Left 06/18/2007   3v CAD; LVEF 50%; refer to CVTS for CABG; Location: [place]; Surgeon: [name], MD   CORONARY ARTERY BYPASS GRAFT N/A 06/25/2007   Procedure: 4v CABG (LIMA-LAD, SVG-RPDA, SVG-OM3, SVG-D2); Location: [facility]; Surgeon: Marnee Guarneri, MD   CORONARY/GRAFT ANGIOGRAPHY N/A 06/05/2018   Procedure: Remus Blake ANGIOGRAPHY;  Surgeon: Nelva Bush, MD;  Location: [place] [facility];  Service: Cardiovascular;  Laterality: N/A;   ENDOVASCULAR REPAIR/STENT GRAFT N/A 08/04/2021   Procedure: ENDOVASCULAR REPAIR/STENT GRAFT;  Surgeon: Algernon Huxley, MD;  Location: [place] [facility];  Service: Cardiovascular;  Laterality: N/A;   EXPLORATORY LAPAROTOMY  1997   HERNIA REPAIR     KNEE ARTHROPLASTY Left 09/19/2016   Procedure: COMPUTER ASSISTED TOTAL KNEE ARTHROPLASTY;  Surgeon: Dereck Leep, MD;  Location: [facility];  Service: Orthopedics;  Laterality: Left;   TOTAL KNEE ARTHROPLASTY Right 09/2016    Prior to Admission medications   Medication Sig Start Date End Date Taking? Authorizing Provider  Cholecalciferol 50 MCG (2000 UT) CAPS Take 1 capsule by mouth daily.  Yes [provider]  levothyroxine (SYNTHROID) 75 MCG tablet Take 1 tablet (75 mcg total) by mouth daily. 02/19/21  Yes Cannady, [name], NP  melatonin 3 MG TABS tablet Take 1 tablet (3 mg total) by mouth at bedtime. 07/30/21  Yes Vigg, Avanti, MD  metoprolol tartrate (LOPRESSOR) 25 MG tablet Take 0.5 tablets (12.5 mg total) by mouth 2 (two) times daily. 09/29/21  Yes Wellington Hampshire, MD  omeprazole (PRILOSEC) 20 MG capsule  Take 1 capsule (20 mg total) by mouth daily. 09/03/21  Yes Vigg, Avanti, MD  rosuvastatin (CRESTOR) 40 MG tablet Take 1 tablet (40 mg total) by mouth daily. 02/08/21  Yes Wellington Hampshire, MD  apixaban (ELIQUIS) 5 MG TABS tablet Take 1 tablet (5 mg total) by mouth 2 (two) times daily. 08/23/21   Wellington Hampshire, MD  aspirin EC 81 MG EC tablet Take 1 tablet (81 mg total) by mouth daily at 6 (six) AM. Swallow whole. 08/06/21   Stegmayer, Janalyn Harder, PA-C  buPROPion (WELLBUTRIN SR) 200 MG 12 hr tablet Take 1 tablet (200 mg total) by mouth 2 (two) times daily. 10/29/21   Vigg, Avanti, MD  citalopram (CELEXA) 20 MG tablet Take 1 tablet (20 mg total) by mouth daily. 07/03/20   Cannady, Henrine Screws T, NP  ezetimibe (ZETIA) 10 MG tablet Take 1 tablet (10 mg total) by mouth daily. 01/07/21   Cannady, Henrine Screws T, NP  HYDROcodone-acetaminophen (NORCO/VICODIN) 5-325 MG tablet Take 1 tablet by mouth every 6 (six) hours as needed for moderate pain. 09/10/21 09/10/22  Kris Hartmann, NP    Allergies as of 10/05/2021 - Review Complete 09/29/2021  Allergen Reaction Noted   Benzodiazepines  06/06/2018   Codeine Nausea Only 02/03/2015   Tetracycline Rash 02/03/2015    Family History  Problem Relation Age of Onset   Heart disease Mother    Heart attack Mother    Hypertension Mother    Heart attack Father    Hypertension Father    Kidney disease Brother    Depression Maternal Grandmother    Depression Maternal Grandfather    Heart disease Other    Heart attack Other    Prostate cancer Neg Hx    Kidney cancer Neg Hx    Stomach cancer Neg Hx    Colon cancer Neg Hx     Social History   Socioeconomic History   Marital status: Married    Spouse name: Not on file   Number of children: Not on file   Years of education: 14   Highest education level: Some college, no degree  Occupational History   Occupation: retired  Tobacco Use   Smoking status: Former    Packs/day: 1.00    Years: 35.00    Pack years:  35.00    Types: Cigarettes    Quit date: 12/16/2005    Years since quitting: 15.9   Smokeless tobacco: Current    Types: Chew   Tobacco comments:    pt states he very rarely uses smokeless tobacco   Vaping Use   Vaping Use: Never used  Substance and Sexual Activity   Alcohol use: No   Drug use: No   Sexual activity: Yes    Partners: Female  Other Topics Concern   Not on file  Social History Narrative   Not on file   Social Determinants of Health   Financial Resource Strain: Low Risk    Difficulty of Paying Living Expenses: Not hard at all  Food Insecurity:  No Food Insecurity   Worried About Charity fundraiser in the Last Year: Never true   Ran Out of Food in the Last Year: Never true  Transportation Needs: No Transportation Needs   Lack of Transportation (Medical): No   Lack of Transportation (Non-Medical): No  Physical Activity: Inactive   Days of Exercise per Week: 0 days   Minutes of Exercise per Session: 0 min  Stress: No Stress Concern Present   Feeling of Stress : Not at all  Social Connections: Not on file  Intimate Partner Violence: Not on file    Review of Systems: See HPI, otherwise negative ROS  Physical Exam: BP 115/82    Pulse 70    Temp 97.6 F (36.4 C) (Temporal)    Resp 16    Ht 6' (1.829 m)    Wt 96.2 kg    SpO2 99%    BMI 28.75 kg/m  General:   Alert,  pleasant and cooperative in NAD Head:  Normocephalic and atraumatic. Neck:  Supple; no masses or thyromegaly. Lungs:  Clear throughout to auscultation.    Heart:  Regular rate and rhythm. Abdomen:  Soft, nontender and nondistended. Normal bowel sounds, without guarding, and without rebound.   Neurologic:  Alert and  oriented x4;  grossly normal neurologically.  Impression/Plan: [name] is here for an colonoscopy to be performed for h/o colon polyps  Risks, benefits, limitations, and alternatives regarding  colonoscopy have been reviewed with the patient.  Questions have been answered.   All parties agreeable.   Sherri Sear, MD  11/17/2021, 9:20 AM

## 2021-11-17 NOTE — Transfer of Care (Signed)
Immediate Anesthesia Transfer of Care Note  Patient: Stuart Harvey  Procedure(s) Performed: COLONOSCOPY WITH PROPOFOL  Patient Location: PACU and Endoscopy Unit  Anesthesia Type:General  Level of Consciousness: drowsy  Airway & Oxygen Therapy: Patient Spontanous Breathing  Post-op Assessment: Report given to RN and Post -op Vital signs reviewed and stable  Post vital signs: Reviewed and stable  Last Vitals:  Vitals Value Taken Time  BP 97/54 11/17/21 1022  Temp    Pulse    Resp 17 11/17/21 1022  SpO2    Vitals shown include unvalidated device data.  Last Pain:  Vitals:   11/17/21 0906  TempSrc: Temporal  PainSc: 0-No pain         Complications: No notable events documented.

## 2021-11-17 NOTE — Anesthesia Postprocedure Evaluation (Signed)
Anesthesia Post Note  Patient: Stuart Harvey  Procedure(s) Performed: COLONOSCOPY WITH PROPOFOL  Patient location during evaluation: Endoscopy Anesthesia Type: General Level of consciousness: awake and alert Pain management: pain level controlled Vital Signs Assessment: post-procedure vital signs reviewed and stable Respiratory status: spontaneous breathing, nonlabored ventilation, respiratory function stable and patient connected to nasal cannula oxygen Cardiovascular status: blood pressure returned to baseline and stable Postop Assessment: no apparent nausea or vomiting Anesthetic complications: no   No notable events documented.   Last Vitals:  Vitals:   11/17/21 1030 11/17/21 1040  BP: (!) 87/63 108/72  Pulse: (!) 31 (!) 58  Resp: (!) 21 17  Temp:    SpO2: 93% 99%    Last Pain:  Vitals:   11/17/21 1023  TempSrc:   PainSc: 0-No pain                 Precious Haws Cherylene Ferrufino

## 2021-11-17 NOTE — Anesthesia Procedure Notes (Signed)
Procedure Name: MAC Date/Time: 11/17/2021 9:55 AM Performed by: Biagio Borg, CRNA Pre-anesthesia Checklist: Patient identified, Emergency Drugs available, Suction available, Patient being monitored and Timeout performed Patient Re-evaluated:Patient Re-evaluated prior to induction Oxygen Delivery Method: Nasal cannula Induction Type: IV induction Placement Confirmation: positive ETCO2 and CO2 detector

## 2021-11-17 NOTE — Anesthesia Preprocedure Evaluation (Signed)
Anesthesia Evaluation  Patient identified by MRN, date of birth, ID band Patient awake    Reviewed: Allergy & Precautions, NPO status , Patient's Chart, lab work & pertinent test results  History of Anesthesia Complications Negative for: history of anesthetic complications  Airway Mallampati: III  TM Distance: >3 FB Neck ROM: full    Dental  (+) Chipped, Poor Dentition, Missing   Pulmonary neg shortness of breath, former smoker,    Pulmonary exam normal        Cardiovascular Exercise Tolerance: Good hypertension, (-) angina+ CAD and + Peripheral Vascular Disease  Normal cardiovascular exam     Neuro/Psych PSYCHIATRIC DISORDERS negative neurological ROS     GI/Hepatic Neg liver ROS, GERD  Medicated and Controlled,  Endo/Other  Hypothyroidism   Renal/GU Renal disease  negative genitourinary   Musculoskeletal  (+) Arthritis ,   Abdominal   Peds  Hematology negative hematology ROS (+)   Anesthesia Other Findings Past Medical History: No date: AAA (abdominal aortic aneurysm)     Comment:  a.) s/p EVAR in 08/2007 by Dr. Hulda Humphrey. b.) CTA on               06/30/2021 --> interval aneurysmal dilitation superior to              stent graft with the juxtarenal aorta measuring 3.8 cm. No date: Anemia No date: Arthritis     Comment:  both knees No date: Chronic anticoagulation     Comment:  Apixaban No date: CKD (chronic kidney disease), stage II No date: Coronary artery disease     Comment:  a.) 4v CABG @ [facility] 06/2007 (LIMA-LAD, VG-RPDA,               VG-OM3,VG-D2); b.) LHC 2011: patent grafts EF 40%, c.)               LHC 06/05/18: LM nl, pLAD 50%, p-mLAD 50%, ostD1 70%, ost               ramus 30%, ost-pLCx 40%, pLCx 100% (chronically               occluded), OM3 100%, p-dRCA 100% (chronically occluded),               LIMA-LAD patent, VG-RPDA patent, VG-OM3 mid-graft 60% and              80% No date: Depression No  date: GERD (gastroesophageal reflux disease) No date: Hernia of abdominal cavity 09/2019: History of 2019 novel coronavirus disease (COVID-19) No date: Hyperlipidemia No date: Hypertension No date: Hypothyroidism No date: Iliac artery aneurysm, right (HCC)     Comment:  a.) at the distal landing zone of the right iliac limb               measuring 3 cm 06/30/2021: Right middle lobe pulmonary nodule     Comment:  a.) measured 8 mm by CT on 06/30/2021. 06/25/2007: S/P CABG x 4     Comment:  a.) LIMA-LAD, SVG-RPDA, SVG-OM3, SVG-D2 No date: Sigmoid diverticulosis 05/2018: Ventricular tachycardia     Comment:  a.) found unresponsive by EMS; VT noted -->               defibrillation achieved ROSC. Briefly intubated. Episode               felt to be secondary to heat stoke.  Past Surgical History: 08/29/2007: ABDOMINAL AORTIC ANEURYSM REPAIR; N/A     Comment:  Procedure: EVAR;  Location: [facility]; Surgeon: Collene Schlichter,               MD 12/05/2012: ABDOMINAL AORTIC ANEURYSM REPAIR; N/A     Comment:  Procedure: Abdominal aortic aneurysm of approximately 6               cm in maximal diameter status post previous endovascular               repair with type I and III endoleaks; Location: [facility];               Surgeon: Leotis Pain, MD 06/28/2010: CARDIAC CATHETERIZATION; Left     Comment:  3v CAD with patent CABG grafts; LVEF 40%; stable aortic               stent graft; Location: [facility]; Surgeon: Serafina Royals, MD 06/18/2007: CARDIAC CATHETERIZATION; Left     Comment:  3v CAD; LVEF 50%; refer to CVTS for CABG; Location:               [facility]; Surgeon: [name], MD 06/25/2007: CORONARY ARTERY BYPASS GRAFT; N/A     Comment:  Procedure: 4v CABG (LIMA-LAD, SVG-RPDA, SVG-OM3,               SVG-D2); Location: [facility]; Surgeon: Marnee Guarneri, MD 06/05/2018: Remus Blake ANGIOGRAPHY; N/A     Comment:  Procedure: CORONARY/GRAFT ANGIOGRAPHY;  Surgeon: Nelva Bush, MD;  Location: [facility];                Service: Cardiovascular;  Laterality: N/A; 08/04/2021: ENDOVASCULAR REPAIR/STENT GRAFT; N/A     Comment:  Procedure: ENDOVASCULAR REPAIR/STENT GRAFT;  Surgeon:               Algernon Huxley, MD;  Location: [facility];                Service: Cardiovascular;  Laterality: N/A; 1997: EXPLORATORY LAPAROTOMY No date: HERNIA REPAIR 09/19/2016: KNEE ARTHROPLASTY; Left     Comment:  Procedure: COMPUTER ASSISTED TOTAL KNEE ARTHROPLASTY;                Surgeon: Dereck Leep, MD;  Location: [facility];                Service: Orthopedics;  Laterality: Left; 09/2016: TOTAL KNEE ARTHROPLASTY; Right  BMI    Body Mass Index: 28.75 kg/m      Reproductive/Obstetrics negative OB ROS                             Anesthesia Physical Anesthesia Plan  ASA: 3  Anesthesia Plan: General   Post-op Pain Management:    Induction: Intravenous  PONV Risk Score and Plan: Propofol infusion and TIVA  Airway Management Planned: Natural Airway and Nasal Cannula  Additional Equipment:   Intra-op Plan:   Post-operative Plan:   Informed Consent: I have reviewed the patients History and Physical, chart, labs and discussed the procedure including the risks, benefits and alternatives for the proposed anesthesia with the patient or authorized representative who has indicated his/her understanding and acceptance.     Dental Advisory Given  Plan Discussed with: Anesthesiologist, CRNA and Surgeon  Anesthesia Plan Comments: (Patient consented for risks of anesthesia including but not limited to:  - adverse reactions to medications - risk of airway placement if required - damage to eyes,  teeth, lips or other oral mucosa - nerve damage due to positioning  - sore throat or hoarseness - Damage to heart, brain, nerves, lungs, other parts of body or loss of life  Patient voiced understanding.)        Anesthesia Quick Evaluation

## 2021-11-18 ENCOUNTER — Telehealth: Payer: Self-pay | Admitting: Cardiovascular Disease

## 2021-11-18 ENCOUNTER — Encounter: Payer: Self-pay | Admitting: Gastroenterology

## 2021-11-18 DIAGNOSIS — I480 Paroxysmal atrial fibrillation: Secondary | ICD-10-CM

## 2021-11-18 LAB — SURGICAL PATHOLOGY

## 2021-11-18 NOTE — Telephone Encounter (Signed)
Patient states his Eliquis was only sent in for 30 days, he needs it for 90 days. Please call to discuss.

## 2021-11-19 MED ORDER — APIXABAN 5 MG PO TABS: 5.0000 mg | ORAL_TABLET | Freq: Two times a day (BID) | ORAL | 1 refills | Status: DC

## 2021-11-19 NOTE — Telephone Encounter (Signed)
Called pt in reference to the message received; the refill was received by the pharmacy as a 30 day supply and sent with refills on 08/23/2021. Took the time to explain to him and advised that I can resend for 90 day supply today. He stated that would great and it is cheaper for him to have a 90 day supply. Advised I will send 90 day supply and a refill at this time & he verbalized understanding.   Eliquis 5mg  BIB: Patient is 71 years old, weight-96.2kg, Crea-1.22 on 09/22/2021, Diagnosis-Afib, and last seen by Dr. Fletcher Anon on 09/17/2021. Dose is appropriate based on dosing criteria. Will send in refill to requested pharmacy for 90 day supply.

## 2021-11-25 ENCOUNTER — Other Ambulatory Visit (INDEPENDENT_AMBULATORY_CARE_PROVIDER_SITE_OTHER): Payer: Self-pay | Admitting: Nurse Practitioner

## 2021-11-30 ENCOUNTER — Other Ambulatory Visit: Payer: Self-pay | Admitting: Internal Medicine

## 2021-11-30 NOTE — Telephone Encounter (Signed)
Requested Prescriptions  Pending Prescriptions Disp Refills   omeprazole (PRILOSEC) 20 MG capsule [Pharmacy Med Name: OMEPRAZOLE DR 20 MG CAPSULE] 90 capsule 0    Sig: Take 1 capsule (20 mg total) by mouth daily.     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

## 2021-12-07 ENCOUNTER — Ambulatory Visit (INDEPENDENT_AMBULATORY_CARE_PROVIDER_SITE_OTHER): Payer: Medicare HMO

## 2021-12-07 ENCOUNTER — Encounter (INDEPENDENT_AMBULATORY_CARE_PROVIDER_SITE_OTHER): Payer: Self-pay | Admitting: Vascular Surgery

## 2021-12-07 ENCOUNTER — Ambulatory Visit (INDEPENDENT_AMBULATORY_CARE_PROVIDER_SITE_OTHER): Payer: Medicare HMO | Admitting: Vascular Surgery

## 2021-12-07 ENCOUNTER — Other Ambulatory Visit (INDEPENDENT_AMBULATORY_CARE_PROVIDER_SITE_OTHER): Payer: Self-pay | Admitting: Vascular Surgery

## 2021-12-07 VITALS — BP 151/87 | HR 71 | Resp 16 | Ht 72.0 in | Wt 221.0 lb

## 2021-12-07 DIAGNOSIS — N1831 Chronic kidney disease, stage 3a: Secondary | ICD-10-CM

## 2021-12-07 DIAGNOSIS — I2510 Atherosclerotic heart disease of native coronary artery without angina pectoris: Secondary | ICD-10-CM | POA: Diagnosis not present

## 2021-12-07 DIAGNOSIS — I7143 Infrarenal abdominal aortic aneurysm, without rupture: Secondary | ICD-10-CM | POA: Diagnosis not present

## 2021-12-07 DIAGNOSIS — E785 Hyperlipidemia, unspecified: Secondary | ICD-10-CM

## 2021-12-07 DIAGNOSIS — Z9889 Other specified postprocedural states: Secondary | ICD-10-CM

## 2021-12-07 NOTE — Assessment & Plan Note (Signed)
Duplex today shows shrinkage of his right iliac artery down to 2.9 cm maximal diameter down from 3.3 cm several months ago.  He is doing well.  No endoleak seen on duplex with a patent stent graft.  We will stretch out his follow-up to 6 months.

## 2021-12-07 NOTE — Progress Notes (Signed)
MRN : [id number]  Stuart Harvey is a 71 y.o. (21-Mar-1951) male who presents with chief complaint of  Chief Complaint  Patient presents with   Follow-up    ultrasound  .  History of Present Illness: Patient returns today in follow up of his aneurysm. He is about 4 months s/p extension of his iliac stent on the right side for a type IIB endoleak. He is doing well. He has no complaints today. Duplex today shows shrinkage of his right iliac artery down to 2.9 cm maximal diameter down from 3.3 cm several months ago.  He is doing well.  No endoleak seen on duplex with a patent stent graft.  Current Outpatient Medications  Medication Sig Dispense Refill   apixaban (ELIQUIS) 5 MG TABS tablet Take 1 tablet (5 mg total) by mouth 2 (two) times daily. 180 tablet 1   aspirin EC 81 MG EC tablet Take 1 tablet (81 mg total) by mouth daily at 6 (six) AM. Swallow whole. 90 tablet 3   buPROPion (WELLBUTRIN SR) 200 MG 12 hr tablet Take 1 tablet (200 mg total) by mouth 2 (two) times daily. 180 tablet 1   Cholecalciferol 50 MCG (2000 UT) CAPS Take 1 capsule by mouth daily.      citalopram (CELEXA) 20 MG tablet Take 1 tablet (20 mg total) by mouth daily. 90 tablet 4   ezetimibe (ZETIA) 10 MG tablet Take 1 tablet (10 mg total) by mouth daily. 90 tablet 3   HYDROcodone-acetaminophen (NORCO/VICODIN) 5-325 MG tablet Take 1 tablet by mouth every 6 (six) hours as needed for moderate pain. 30 tablet 0   levothyroxine (SYNTHROID) 75 MCG tablet Take 1 tablet (75 mcg total) by mouth daily. 90 tablet 3   melatonin 3 MG TABS tablet Take 1 tablet (3 mg total) by mouth at bedtime. 30 tablet 1   metoprolol tartrate (LOPRESSOR) 25 MG tablet Take 0.5 tablets (12.5 mg total) by mouth 2 (two) times daily. 30 tablet 7   omeprazole (PRILOSEC) 20 MG capsule Take 1 capsule (20 mg total) by mouth daily. 90 capsule 0   rosuvastatin (CRESTOR) 40 MG tablet Take 1 tablet (40 mg total) by mouth daily. 90 tablet 3   No current  facility-administered medications for this visit.    Past Medical History:  Diagnosis Date   AAA (abdominal aortic aneurysm)    a.) s/p EVAR in 08/2007 by Dr. Hulda Humphrey. b.) CTA on 06/30/2021 --> interval aneurysmal dilitation superior to stent graft with the juxtarenal aorta measuring 3.8 cm.   Anemia    Arthritis    both knees   Chronic anticoagulation    Apixaban   CKD (chronic kidney disease), stage II    Coronary artery disease    a.) 4v CABG @ [facility] 06/2007 (LIMA-LAD, VG-RPDA, VG-OM3,VG-D2); b.) LHC 2011: patent grafts EF 40%, c.) LHC 06/05/18: LM nl, pLAD 50%, p-mLAD 50%, ostD1 70%, ost ramus 30%, ost-pLCx 40%, pLCx 100% (chronically occluded), OM3 100%, p-dRCA 100% (chronically occluded), LIMA-LAD patent, VG-RPDA patent, VG-OM3 mid-graft 60% and 80%   Depression    GERD (gastroesophageal reflux disease)    Hernia of abdominal cavity    History of 2019 novel coronavirus disease (COVID-19) 09/2019   Hyperlipidemia    Hypertension    Hypothyroidism    Iliac artery aneurysm, right ([place])    a.) at the distal landing zone of the right iliac limb measuring 3 cm   Right middle lobe pulmonary nodule 06/30/2021   a.) measured 8  mm by CT on 06/30/2021.   S/P CABG x 4 06/25/2007   a.) LIMA-LAD, SVG-RPDA, SVG-OM3, SVG-D2   Sigmoid diverticulosis    Ventricular tachycardia 05/2018   a.) found unresponsive by EMS; VT noted --> defibrillation achieved ROSC. Briefly intubated. Episode felt to be secondary to heat stoke.    Past Surgical History:  Procedure Laterality Date   ABDOMINAL AORTIC ANEURYSM REPAIR N/A 08/29/2007   Procedure: EVAR; Location: [facility]; Surgeon: Collene Schlichter, MD   ABDOMINAL AORTIC ANEURYSM REPAIR N/A 12/05/2012   Procedure: Abdominal aortic aneurysm of approximately 6 cm in maximal diameter status post previous endovascular repair with type I and III endoleaks; Location: [facility]; Surgeon: Leotis Pain, MD   CARDIAC CATHETERIZATION Left 06/28/2010   3v CAD with patent CABG grafts;  LVEF 40%; stable aortic stent graft; Location: [facility]; Surgeon: Serafina Royals, MD   CARDIAC CATHETERIZATION Left 06/18/2007   3v CAD; LVEF 50%; refer to CVTS for CABG; Location: [facility]; Surgeon: [name], MD   COLONOSCOPY WITH PROPOFOL N/A 11/17/2021   Procedure: COLONOSCOPY WITH PROPOFOL;  Surgeon: Lin Landsman, MD;  Location: [facility];  Service: Gastroenterology;  Laterality: N/A;   CORONARY ARTERY BYPASS GRAFT N/A 06/25/2007   Procedure: 4v CABG (LIMA-LAD, SVG-RPDA, SVG-OM3, SVG-D2); Location: [facility]; Surgeon: Marnee Guarneri, MD   CORONARY/GRAFT ANGIOGRAPHY N/A 06/05/2018   Procedure: Remus Blake ANGIOGRAPHY;  Surgeon: Nelva Bush, MD;  Location: [facility];  Service: Cardiovascular;  Laterality: N/A;   ENDOVASCULAR REPAIR/STENT GRAFT N/A 08/04/2021   Procedure: ENDOVASCULAR REPAIR/STENT GRAFT;  Surgeon: Algernon Huxley, MD;  Location: [facility];  Service: Cardiovascular;  Laterality: N/A;   EXPLORATORY LAPAROTOMY  1997   HERNIA REPAIR     KNEE ARTHROPLASTY Left 09/19/2016   Procedure: COMPUTER ASSISTED TOTAL KNEE ARTHROPLASTY;  Surgeon: Dereck Leep, MD;  Location: [facility];  Service: Orthopedics;  Laterality: Left;   TOTAL KNEE ARTHROPLASTY Right 09/2016     Social History   Tobacco Use   Smoking status: Former    Packs/day: 1.00    Years: 35.00    Pack years: 35.00    Types: Cigarettes    Quit date: 12/16/2005    Years since quitting: 15.9   Smokeless tobacco: Current    Types: Chew   Tobacco comments:    pt states he very rarely uses smokeless tobacco   Vaping Use   Vaping Use: Never used  Substance Use Topics   Alcohol use: No   Drug use: No      Family History  Problem Relation Age of Onset   Heart disease Mother    Heart attack Mother    Hypertension Mother    Heart attack Father    Hypertension Father    Kidney disease Brother    Depression Maternal Grandmother    Depression Maternal Grandfather    Heart disease Other     Heart attack Other    Prostate cancer Neg Hx    Kidney cancer Neg Hx    Stomach cancer Neg Hx    Colon cancer Neg Hx      Allergies  Allergen Reactions   Benzodiazepines     Paradoxical agitation and delirium   Codeine Nausea Only   Tetracycline Rash      REVIEW OF SYSTEMS (Negative unless checked)   Constitutional: _0 Weight loss  _1 Fever  _2 Chills Cardiac: _3 Chest pain   _4 Chest pressure   _5 Palpitations   _6 Shortness of breath when laying flat   _7 Shortness of breath at rest   _8   Shortness of breath with exertion. Vascular:  _0 Pain in legs with walking   _1 Pain in legs at rest   _2 Pain in legs when laying flat   _3 Claudication   _4 Pain in feet when walking  _5 Pain in feet at rest  _6 Pain in feet when laying flat   _7 History of DVT   _8 Phlebitis   _9 Swelling in legs   _10 Varicose veins   _11 Non-healing ulcers Pulmonary:   _12 Uses home oxygen   _13 Productive cough   _14 Hemoptysis   _15 Wheeze  _16 COPD   _17 Asthma Neurologic:  _18 Dizziness  _19 Blackouts   _20 Seizures   _21 History of stroke   _22 History of TIA  _23 Aphasia   _24 Temporary blindness   _25 Dysphagia   _26 Weakness or numbness in arms   _27 Weakness or numbness in legs Musculoskeletal:  _28 Arthritis   _29 Joint swelling   _30 Joint pain   _31 Low back pain Hematologic:  _32 Easy bruising  _33 Easy bleeding   _34 Hypercoagulable state   _35 Anemic   Gastrointestinal:  _36 Blood in stool   _37 Vomiting blood  _38 Gastroesophageal reflux/heartburn   _39 Abdominal pain Genitourinary:  _40 Chronic kidney disease   _41 Difficult urination  _42 Frequent urination  _43 Burning with urination   _44 Hematuria Skin:  _45 Rashes   _46 Ulcers   _47 Wounds Psychological:  _48 History of anxiety   _49  History of major depression.  Physical Examination  BP (!) 151/87 (BP Location: Left Arm)    Pulse 71    Resp 16    Ht 6' (1.829 m)    Wt 221 lb (100.2 kg)    BMI 29.97 kg/m  Gen:  WD/WN, NAD Head: [place]/AT, No temporalis wasting. Ear/Nose/Throat: Hearing grossly intact, nares w/o erythema or  drainage Eyes: Conjunctiva clear. Sclera non-icteric Neck: Supple.  Trachea midline Pulmonary:  Good air movement, no use of accessory muscles.  Cardiac: RRR, no JVD Vascular:  Vessel Right Left  Radial Palpable Palpable       Musculoskeletal: M/S 5/5 throughout.  No deformity or atrophy. no edema. Neurologic: Sensation grossly intact in extremities.  Symmetrical.  Speech is fluent.  Psychiatric: Judgment intact, Mood & affect appropriate for pt's clinical situation. Dermatologic: No rashes or ulcers noted.  No cellulitis or open wounds.      Labs Recent Results (from the past 2160 hour(s))  PSA     Status: None   Collection Time: 09/22/21  9:57 AM  Result Value Ref Range   Prostate Specific Ag, Serum 1.3 0.0 - 4.0 ng/mL    Comment: Roche ECLIA methodology. According to the American Urological Association, Serum PSA should decrease and remain at undetectable levels after radical prostatectomy. The AUA defines biochemical recurrence as an initial PSA value 0.2 ng/mL or greater followed by a subsequent confirmatory PSA value 0.2 ng/mL or greater. Values obtained with different assay methods or kits cannot be used interchangeably. Results cannot be interpreted as absolute evidence of the presence or absence of malignant disease.   TSH     Status: None   Collection Time: 09/22/21  9:57 AM  Result Value Ref Range   TSH 2.790 0.450 - 4.500 uIU/mL  Comprehensive metabolic panel     Status: Abnormal   Collection Time: 09/22/21  9:57 AM  Result Value Ref Range   Glucose 105 (H) 70 - 99 mg/dL   BUN 21 8 - 27 mg/dL   Creatinine, Ser 1.22 0.76 - 1.27 mg/dL   eGFR 64 >59 mL/min/1.73   BUN/Creatinine Ratio 17 10 - 24   Sodium 141 134 - 144 mmol/L   Potassium 4.6 3.5 - 5.2  mmol/L   Chloride 105 96 - 106 mmol/L   CO2 23 20 - 29 mmol/L   Calcium 9.2 8.6 - 10.2 mg/dL   Total Protein 6.6 6.0 - 8.5 g/dL   Albumin 4.3 3.8 - 4.8 g/dL   Globulin, Total 2.3 1.5 - 4.5 g/dL    Albumin/Globulin Ratio 1.9 1.2 - 2.2   Bilirubin Total 0.4 0.0 - 1.2 mg/dL   Alkaline Phosphatase 116 44 - 121 IU/L   AST 37 0 - 40 IU/L   ALT 24 0 - 44 IU/L  CBC w/Diff     Status: Abnormal   Collection Time: 09/22/21  9:57 AM  Result Value Ref Range   WBC 8.1 3.4 - 10.8 x10E3/uL   RBC 4.95 4.14 - 5.80 x10E6/uL   Hemoglobin 15.0 13.0 - 17.7 g/dL   Hematocrit 44.7 37.5 - 51.0 %   MCV 90 79 - 97 fL   MCH 30.3 26.6 - 33.0 pg   MCHC 33.6 31.5 - 35.7 g/dL   RDW 13.0 11.6 - 15.4 %   Platelets 165 150 - 450 x10E3/uL   Neutrophils 34 Not Estab. %   Lymphs 53 Not Estab. %   Monocytes 6 Not Estab. %   Eos 6 Not Estab. %   Basos 1 Not Estab. %   Neutrophils Absolute 2.8 1.4 - 7.0 x10E3/uL   Lymphocytes Absolute 4.3 (H) 0.7 - 3.1 x10E3/uL   Monocytes Absolute 0.5 0.1 - 0.9 x10E3/uL   EOS (ABSOLUTE) 0.5 (H) 0.0 - 0.4 x10E3/uL   Basophils Absolute 0.1 0.0 - 0.2 x10E3/uL   Immature Granulocytes 0 Not Estab. %   Immature Grans (Abs) 0.0 0.0 - 0.1 x10E3/uL  Lipid panel     Status: None   Collection Time: 09/22/21  9:57 AM  Result Value Ref Range   Cholesterol, Total 145 100 - 199 mg/dL   Triglycerides 114 0 - 149 mg/dL   HDL 47 >39 mg/dL   VLDL Cholesterol Cal 21 5 - 40 mg/dL   LDL Chol Calc (NIH) 77 0 - 99 mg/dL   Chol/HDL Ratio 3.1 0.0 - 5.0 ratio    Comment:                                   T. Chol/HDL Ratio                                             Men  Women                               1/2 Avg.Risk  3.4    3.3                                   Avg.Risk  5.0    4.4                                2X Avg.Risk  9.6    7.1  3X Avg.Risk 23.4   11.0   Surgical pathology     Status: None   Collection Time: 11/17/21 10:13 AM  Result Value Ref Range   SURGICAL PATHOLOGY      SURGICAL PATHOLOGY CASE: [id number] PATIENT: Ewan Harmes Surgical Pathology Report     Specimen Submitted: A. Rectum polyp x2; cold snare  Clinical History: Colon  cancer screening Z12.11.  Rectal polyps, diverticulosis      DIAGNOSIS: A. RECTUM POLYP X2; COLD SNARE: - HYPERPLASTIC POLYP, TWO FRAGMENTS. - NEGATIVE FOR DYSPLASIA AND MALIGNANCY.   GROSS DESCRIPTION: A. Labeled: Cold snare polyps x2 rectum Received: Formalin Collection time: 10:13 AM on 11/17/2021 Placed into formalin time: 10:14 AM on 11/17/2021 Tissue fragment(s): Multiple Size: Aggregate, 1.2 x 0.7 x 0.3 cm Description: Received are fragments of tan to pink soft tissue.  The 2 larger soft tissue fragments have resection margins which are differentially inked.  These fragments are sectioned. Entirely submitted in cassettes 1-2 with the larger serially sectioned fragment in cassette 1 and the smaller trisected fragment with the remaining fragments in cassette 2.  RB 11/17/2021  Final  Diagnosis performed by Betsy Pries, MD.   Electronically signed 11/18/2021 9:25:31AM The electronic signature indicates that the named Attending Pathologist has evaluated the specimen Technical component performed at [facility], [address] [place], [place] [address] Lab: [phone] Dir: Rush Farmer, MD, MMM  Professional component performed at [facility], [facility], [place], [place], [place] [address] Lab: [phone] Dir: Kathi Simpers, MD     Radiology No results found.  Assessment/Plan Coronary artery disease Continue cardiac and antihypertensive medications as already ordered and reviewed, no changes at this time.   Continue statin as ordered and reviewed, no changes at this time   Nitrates PRN for chest pain     Hyperlipidemia lipid control important in reducing the progression of atherosclerotic disease. Continue statin therapy     CKD (chronic kidney disease) stage 3, GFR 30-59 ml/min (HCC) No obvious dysfunction after his CT scan.  We will try to limit his contrast with his upcoming procedure.  AAA (abdominal aortic aneurysm)  (HCC) Duplex today shows shrinkage of his right iliac artery down to 2.9 cm maximal diameter down from 3.3 cm several months ago.  He is doing well.  No endoleak seen on duplex with a patent stent graft.  We will stretch out his follow-up to 6 months.    Leotis Pain, MD  12/07/2021 9:56 AM    This note was created with Dragon medical transcription system.  Any errors from dictation are purely unintentional

## 2021-12-18 ENCOUNTER — Other Ambulatory Visit: Payer: Self-pay | Admitting: Nurse Practitioner

## 2021-12-20 NOTE — Telephone Encounter (Signed)
Future visit in 3 months last labs 09/22/21.  Requested Prescriptions  Pending Prescriptions Disp Refills   ezetimibe (ZETIA) 10 MG tablet [Pharmacy Med Name: EZETIMIBE 10 MG TABLET] 90 tablet 0    Sig: Take 1 tablet (10 mg total) by mouth daily.     Cardiovascular:  Antilipid - Sterol Transport Inhibitors Failed - 12/18/2021  1:11 PM      Failed - Lipid Panel in normal range within the last 12 months    Cholesterol, Total  Date Value Ref Range Status  09/22/2021 145 100 - 199 mg/dL Final   Cholesterol Piccolo, Waived  Date Value Ref Range Status  12/18/2015 159 <200 mg/dL Final    Comment:                            Desirable                <200                         Borderline High      200- 239                         High                     >239    LDL Chol Calc (NIH)  Date Value Ref Range Status  09/22/2021 77 0 - 99 mg/dL Final   HDL  Date Value Ref Range Status  09/22/2021 47 >39 mg/dL Final   Triglycerides  Date Value Ref Range Status  09/22/2021 114 0 - 149 mg/dL Final   Triglycerides Piccolo,Waived  Date Value Ref Range Status  12/18/2015 166 (H) <150 mg/dL Final    Comment:                            Normal                   <150                         Borderline High     150 - 199                         High                200 - 499                         Very High                >499          Passed - AST in normal range and within 360 days    AST  Date Value Ref Range Status  09/22/2021 37 0 - 40 IU/L Final   SGOT(AST)  Date Value Ref Range Status  12/13/2014 30 15 - 37 Unit/L Final         Passed - ALT in normal range and within 360 days    ALT  Date Value Ref Range Status  09/22/2021 24 0 - 44 IU/L Final   SGPT (ALT)  Date Value Ref Range Status  12/13/2014 20 14 - 63 U/L Final         Passed -  Patient is not pregnant      Passed - Valid encounter within last 12 months    Recent Outpatient Visits          2 months ago Screen  for colon cancer   [facility] Vigg, Avanti, MD   4 months ago Screening for colon cancer   [facility] Vigg, Avanti, MD   11 months ago 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

## 2022-01-31 ENCOUNTER — Other Ambulatory Visit: Payer: Self-pay | Admitting: Cardiovascular Disease

## 2022-01-31 ENCOUNTER — Telehealth: Payer: Self-pay

## 2022-01-31 ENCOUNTER — Other Ambulatory Visit: Payer: Self-pay | Admitting: Nurse Practitioner

## 2022-01-31 NOTE — Progress Notes (Signed)
? ? ?  Chronic Care Management ?Pharmacy Assistant  ? ?Name: Stuart Harvey  MRN: [id number] DOB: Jan 19, 1951 ? ?Stuart Harvey is an 71 y.o. year old male who presents for his follow-up CCM visit with the clinical pharmacist. ? ?Reason for Encounter: Disease State-General ?  ? ?Recent office visits:  ?09/29/21 Vigg, Avanti, MD-PCP (HTN, Hyperlip, Depression) Labs ordered, no med changes ? ?Recent consult visits:  ?12/07/21 Algernon Huxley, MD-Vascular Surgery (Infrarenal abdominal aortic aneurysm) VAS [place] EVAR DUPLEX, no med changes ? ?09/17/21 Wellington Hampshire, MD-Cardiology (CAD) No orders or med changes ? ?09/16/21 Billey Co, MD-Urology (Erectile dysfunction) No orders or med changes ? ?09/06/21 Kris Hartmann, NP (Abdominal aortic aneurysm) No orders or med changes ? ?Hospital visits:  ?None in previous 6 months ? ?Medications: ?Outpatient Encounter Medications as of 01/31/2022  ?Medication Sig  ? apixaban (ELIQUIS) 5 MG TABS tablet Take 1 tablet (5 mg total) by mouth 2 (two) times daily.  ? aspirin EC 81 MG EC tablet Take 1 tablet (81 mg total) by mouth daily at 6 (six) AM. Swallow whole.  ? buPROPion (WELLBUTRIN SR) 200 MG 12 hr tablet Take 1 tablet (200 mg total) by mouth 2 (two) times daily.  ? Cholecalciferol 50 MCG (2000 UT) CAPS Take 1 capsule by mouth daily.   ? citalopram (CELEXA) 20 MG tablet Take 1 tablet (20 mg total) by mouth daily.  ? ezetimibe (ZETIA) 10 MG tablet Take 1 tablet (10 mg total) by mouth daily.  ? HYDROcodone-acetaminophen (NORCO/VICODIN) 5-325 MG tablet Take 1 tablet by mouth every 6 (six) hours as needed for moderate pain.  ? levothyroxine (SYNTHROID) 75 MCG tablet Take 1 tablet (75 mcg total) by mouth daily.  ? melatonin 3 MG TABS tablet Take 1 tablet (3 mg total) by mouth at bedtime.  ? metoprolol tartrate (LOPRESSOR) 25 MG tablet Take 0.5 tablets (12.5 mg total) by mouth 2 (two) times daily.  ? omeprazole (PRILOSEC) 20 MG capsule Take 1 capsule (20 mg total) by mouth daily.  ?  rosuvastatin (CRESTOR) 40 MG tablet Take 1 tablet (40 mg total) by mouth daily.  ? ?No facility-administered encounter medications on file as of 01/31/2022.  ? ?What would you like me to pass along to Edison Nasuti Potts,CPP for them to help you with? Patient does not wish to participate with CCM services. ? ? ?Care Gaps: ?Colonoscopy-11/17/21 ?Diabetic Foot Exam-NA ?Ophthalmology-NA ?Dexa Scan - NA ?Annual Well Visit - NA ?Micro albumin-NA ?Hemoglobin A1c- 01/05/21 ? ?Star Rating Drugs: ?Rosuvastatin 40 mg-last fill 11/16/21 90 ds ? ? ?Ethelene Hal ?Clinical Pharmacist Assistant ?[phone]  ?

## 2022-02-02 NOTE — Telephone Encounter (Signed)
Requested Prescriptions  ?Pending Prescriptions Disp Refills  ?? citalopram (CELEXA) 40 MG tablet [Pharmacy Med Name: CITALOPRAM HBR 40 MG TABLET] 90 tablet 0  ?  Sig: Take 1 tablet (40 mg total) by mouth daily.  ?  ? Psychiatry:  Antidepressants - SSRI Passed - 01/31/2022 12:38 PM  ?  ?  Passed - Completed PHQ-2 or PHQ-9 in the last 360 days  ?  ?  Passed - Valid encounter within last 6 months  ?  Recent Outpatient Visits   ?      ? 4 months ago Screen for colon cancer  ? [facility] Vigg, Avanti, MD  ? 6 months ago Screening for colon cancer  ? [facility] Vigg, Avanti, MD  ? 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 [place], [place] T, NP  ? 1 year ago Major depressive disorder with single episode, in partial remission ([place])  ? [place], [place] T, NP  ? 2 years ago Abdominal aortic aneurysm (AAA) without rupture ([place])  ? [facility] [place], Henrine Screws T, NP  ?  ?  ?Future Appointments   ?        ? In 1 month Vigg, Avanti, MD [facility], [facility]  ? In 6 months  [place], [facility]  ?  ? ?  ?  ?  ? ?

## 2022-02-24 ENCOUNTER — Other Ambulatory Visit: Payer: Self-pay | Admitting: Internal Medicine

## 2022-02-24 ENCOUNTER — Other Ambulatory Visit: Payer: Self-pay | Admitting: Nurse Practitioner

## 2022-02-24 NOTE — Telephone Encounter (Signed)
Requested Prescriptions  ?Pending Prescriptions Disp Refills  ?? omeprazole (PRILOSEC) 20 MG capsule [Pharmacy Med Name: OMEPRAZOLE DR 20 MG CAPSULE] 90 capsule 0  ?  Sig: Take 1 capsule (20 mg total) by mouth daily.  ?  ? Gastroenterology: Proton Pump Inhibitors Passed - 02/24/2022 11:32 AM  ?  ?  Passed - Valid encounter within last 12 months  ?  Recent Outpatient Visits   ?      ? 4 months ago Screen for colon cancer  ? [facility] Vigg, Avanti, MD  ? 6 months ago Screening for colon cancer  ? [facility] Vigg, Avanti, MD  ? 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 [place], [place] T, NP  ? 1 year ago Major depressive disorder with single episode, in partial remission ([place])  ? [place], Zebulon T, NP  ? 2 years ago Abdominal aortic aneurysm (AAA) without rupture ([place])  ? [facility] [place], Henrine Screws T, NP  ?  ?  ?Future Appointments   ?        ? In 1 month Vigg, Avanti, MD [facility], [facility]  ? In 5 months  [place], [facility]  ?  ? ?  ?  ?  ? ? ?

## 2022-02-24 NOTE — Telephone Encounter (Signed)
Requested medication (s) are due for refill today: yes ? ?Requested medication (s) are on the active medication list: yes ? ?Last refill:  02/02/22 ? ?Future visit scheduled: yes ? ?Notes to clinic:  Unable to refill per protocol, Rx refill too soon. Refilled 02/02/22 for 90 days. ? ? ?  ?Requested Prescriptions  ?Pending Prescriptions Disp Refills  ? citalopram (CELEXA) 40 MG tablet [Pharmacy Med Name: CITALOPRAM HBR 40 MG TABLET] 90 tablet 0  ?  Sig: Take 1 tablet (40 mg total) by mouth daily.  ?  ? Psychiatry:  Antidepressants - SSRI Passed - 02/24/2022 11:33 AM  ?  ?  Passed - Completed PHQ-2 or PHQ-9 in the last 360 days  ?  ?  Passed - Valid encounter within last 6 months  ?  Recent Outpatient Visits   ? ?      ? 4 months ago Screen for colon cancer  ? [facility] Vigg, Avanti, MD  ? 6 months ago Screening for colon cancer  ? [facility] Vigg, Avanti, MD  ? 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 [place], [place] T, NP  ? 1 year ago Major depressive disorder with single episode, in partial remission ([place])  ? [place], [place] T, NP  ? 2 years ago Abdominal aortic aneurysm (AAA) without rupture ([place])  ? [facility] [place], Henrine Screws T, NP  ? ?  ?  ?Future Appointments   ? ?        ? In 1 month Vigg, Avanti, MD [facility], [facility]  ? In 5 months  [place], [facility]  ? ?  ? ?  ?  ?  ? ? ?

## 2022-03-04 ENCOUNTER — Other Ambulatory Visit: Payer: Self-pay | Admitting: Nurse Practitioner

## 2022-03-04 NOTE — Telephone Encounter (Signed)
Requested Prescriptions  ?Pending Prescriptions Disp Refills  ?? levothyroxine (SYNTHROID) 75 MCG tablet [Pharmacy Med Name: LEVOTHYROXINE 75 MCG TABLET] 90 tablet 0  ?  Sig: Take 1 tablet (75 mcg total) by mouth daily.  ?  ? Endocrinology:  Hypothyroid Agents Passed - 03/04/2022  2:16 PM  ?  ?  Passed - TSH in normal range and within 360 days  ?  TSH  ?Date Value Ref Range Status  ?09/22/2021 2.790 0.450 - 4.500 uIU/mL Final  ?   ?  ?  Passed - Valid encounter within last 12 months  ?  Recent Outpatient Visits   ?      ? 5 months ago Screen for colon cancer  ? [facility] Vigg, Avanti, MD  ? 7 months ago Screening for colon cancer  ? [facility] Vigg, Avanti, MD  ? 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 [place], [place] T, NP  ? 1 year ago Major depressive disorder with single episode, in partial remission ([place])  ? [place], [place] T, NP  ? 2 years ago Abdominal aortic aneurysm (AAA) without rupture ([place])  ? [facility] [place], Henrine Screws T, NP  ?  ?  ?Future Appointments   ?        ? In 3 weeks Vigg, Avanti, MD [facility], [facility]  ? In 5 months  [place], [facility]  ?  ? ?  ?  ?  ? ? ?

## 2022-03-22 ENCOUNTER — Other Ambulatory Visit: Payer: Self-pay | Admitting: Nurse Practitioner

## 2022-03-22 ENCOUNTER — Other Ambulatory Visit: Payer: Medicare HMO

## 2022-03-22 DIAGNOSIS — I25810 Atherosclerosis of coronary artery bypass graft(s) without angina pectoris: Secondary | ICD-10-CM

## 2022-03-22 DIAGNOSIS — E039 Hypothyroidism, unspecified: Secondary | ICD-10-CM

## 2022-03-23 LAB — COMPREHENSIVE METABOLIC PANEL
ALT: 18 IU/L (ref 0–44)
AST: 28 IU/L (ref 0–40)
Albumin/Globulin Ratio: 1.8 (ref 1.2–2.2)
Albumin: 4.2 g/dL (ref 3.8–4.8)
Alkaline Phosphatase: 106 IU/L (ref 44–121)
BUN/Creatinine Ratio: 19 (ref 10–24)
BUN: 25 mg/dL (ref 8–27)
Bilirubin Total: 0.3 mg/dL (ref 0.0–1.2)
CO2: 22 mmol/L (ref 20–29)
Calcium: 9.4 mg/dL (ref 8.6–10.2)
Chloride: 103 mmol/L (ref 96–106)
Creatinine, Ser: 1.31 mg/dL — ABNORMAL HIGH (ref 0.76–1.27)
Globulin, Total: 2.3 g/dL (ref 1.5–4.5)
Glucose: 100 mg/dL — ABNORMAL HIGH (ref 70–99)
Potassium: 4.6 mmol/L (ref 3.5–5.2)
Sodium: 139 mmol/L (ref 134–144)
Total Protein: 6.5 g/dL (ref 6.0–8.5)
eGFR: 59 mL/min/{1.73_m2} — ABNORMAL LOW (ref >59)

## 2022-03-23 LAB — CBC WITH DIFFERENTIAL/PLATELET
Basophils Absolute: 0 10*3/uL (ref 0.0–0.2)
Basos: 1 %
EOS (ABSOLUTE): 0.4 10*3/uL (ref 0.0–0.4)
Eos: 5 %
Hematocrit: 44.5 % (ref 37.5–51.0)
Hemoglobin: 14.3 g/dL (ref 13.0–17.7)
Immature Grans (Abs): 0 10*3/uL (ref 0.0–0.1)
Immature Granulocytes: 0 %
Lymphocytes Absolute: 4.2 10*3/uL — ABNORMAL HIGH (ref 0.7–3.1)
Lymphs: 54 %
MCH: 29.2 pg (ref 26.6–33.0)
MCHC: 32.1 g/dL (ref 31.5–35.7)
MCV: 91 fL (ref 79–97)
Monocytes Absolute: 0.5 10*3/uL (ref 0.1–0.9)
Monocytes: 6 %
Neutrophils Absolute: 2.7 10*3/uL (ref 1.4–7.0)
Neutrophils: 34 %
Platelets: 169 10*3/uL (ref 150–450)
RBC: 4.89 x10E6/uL (ref 4.14–5.80)
RDW: 13.5 % (ref 11.6–15.4)
WBC: 7.8 10*3/uL (ref 3.4–10.8)

## 2022-03-23 LAB — TSH: TSH: 3.96 u[IU]/mL (ref 0.450–4.500)

## 2022-03-23 NOTE — Telephone Encounter (Signed)
Requested Prescriptions  ?Pending Prescriptions Disp Refills  ?? ezetimibe (ZETIA) 10 MG tablet [Pharmacy Med Name: EZETIMIBE 10 MG TABLET] 90 tablet 0  ?  Sig: Take 1 tablet (10 mg total) by mouth daily.  ?  ? Cardiovascular:  Antilipid - Sterol Transport Inhibitors Failed - 03/22/2022 11:43 AM  ?  ?  Failed - Lipid Panel in normal range within the last 12 months  ?  Cholesterol, Total  ?Date Value Ref Range Status  ?09/22/2021 145 100 - 199 mg/dL Final  ? ?Cholesterol Piccolo, [place]  ?Date Value Ref Range Status  ?12/18/2015 159 <200 mg/dL Final  ?  Comment:  ?                          Desirable                <200 ?                        Borderline High      200- 239 ?                        High                     >239 ?  ? ?LDL Chol Calc (NIH)  ?Date Value Ref Range Status  ?09/22/2021 77 0 - 99 mg/dL Final  ? ?HDL  ?Date Value Ref Range Status  ?09/22/2021 47 >39 mg/dL Final  ? ?Triglycerides  ?Date Value Ref Range Status  ?09/22/2021 114 0 - 149 mg/dL Final  ? ?Triglycerides Piccolo,Waived  ?Date Value Ref Range Status  ?12/18/2015 166 (H) <150 mg/dL Final  ?  Comment:  ?                          Normal                   <150 ?                        Borderline High     150 - 199 ?                        High                200 - 499 ?                        Very High                >499 ?  ? ?  ?  ?  Passed - AST in normal range and within 360 days  ?  AST  ?Date Value Ref Range Status  ?03/22/2022 28 0 - 40 IU/L Final  ? ?SGOT(AST)  ?Date Value Ref Range Status  ?12/13/2014 30 15 - 37 Unit/L Final  ?   ?  ?  Passed - ALT in normal range and within 360 days  ?  ALT  ?Date Value Ref Range Status  ?03/22/2022 18 0 - 44 IU/L Final  ? ?SGPT (ALT)  ?Date Value Ref Range Status  ?12/13/2014 20 14 - 63 U/L Final  ?   ?  ?  Passed - Patient is not pregnant  ?  ?  Passed -  Valid encounter within last 12 months  ?  Recent Outpatient Visits   ?      ? 5 months ago Screen for colon cancer  ? [facility]  Vigg, Avanti, MD  ? 7 months ago Screening for colon cancer  ? [facility] Vigg, Avanti, MD  ? 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 [place], [place] T, NP  ? 1 year ago Major depressive disorder with single episode, in partial remission ([place])  ? [place], [place] T, NP  ? 2 years ago Abdominal aortic aneurysm (AAA) without rupture ([place])  ? [facility] [place], Henrine Screws T, NP  ?  ?  ?Future Appointments   ?        ? In 6 days Vigg, Avanti, MD [facility], [facility]  ? In 4 months  [place], [facility]  ?  ? ?  ?  ?  ? ? ?

## 2022-03-29 ENCOUNTER — Encounter: Payer: Self-pay | Admitting: Internal Medicine

## 2022-03-29 ENCOUNTER — Ambulatory Visit: Payer: Medicare HMO | Admitting: Internal Medicine

## 2022-03-29 ENCOUNTER — Telehealth: Payer: Self-pay | Admitting: Cardiovascular Disease

## 2022-03-29 VITALS — BP 106/73 | HR 69 | Temp 98.0°F | Ht 72.01 in | Wt 221.0 lb

## 2022-03-29 DIAGNOSIS — I480 Paroxysmal atrial fibrillation: Secondary | ICD-10-CM | POA: Diagnosis not present

## 2022-03-29 DIAGNOSIS — I10 Essential (primary) hypertension: Secondary | ICD-10-CM | POA: Diagnosis not present

## 2022-03-29 DIAGNOSIS — E039 Hypothyroidism, unspecified: Secondary | ICD-10-CM

## 2022-03-29 DIAGNOSIS — E785 Hyperlipidemia, unspecified: Secondary | ICD-10-CM | POA: Diagnosis not present

## 2022-03-29 DIAGNOSIS — F324 Major depressive disorder, single episode, in partial remission: Secondary | ICD-10-CM

## 2022-03-29 NOTE — Telephone Encounter (Signed)
Pt c/o medication issue: ? ?1. Name of Medication: aspirin EC 81 MG EC tablet ? ?2. How are you currently taking this medication (dosage and times per day)? Take 1 tablet (5 mg total) by mouth 2 (two) times daily. ? ?3. Are you having a reaction (difficulty breathing--STAT)? no ? ?4. What is your medication issue? Patient went to his PCP and she saw bruising on his arm and wants to know if he can stop the aspirin. ? ?

## 2022-03-29 NOTE — Progress Notes (Signed)
? ?BP 106/73   Pulse 69   Temp 98 ?F (36.7 ?C) (Oral)   Ht 6' 0.01" (1.829 m)   Wt 221 lb (100.2 kg)   SpO2 97%   BMI 29.97 kg/m?   ? ?Subjective:  ? ? Patient ID: Stuart Harvey, male    DOB: Oct 07, 1951, 71 y.o.   MRN: [id number] ? ?Chief Complaint  ?Patient presents with  ? Hypertension  ? Hyperlipidemia  ? Depression  ? ? ?HPI: ?FUTURE YELDELL is a 71 y.o. male ? ?Hypertension ?This is a chronic problem. The current episode started more than 1 year ago. Pertinent negatives include no chest pain, headaches, palpitations or shortness of breath.  ?Hyperlipidemia ?This is a chronic problem. The problem is controlled. Pertinent negatives include no chest pain, myalgias or shortness of breath.  ?Depression ?       This is a chronic problem.  Associated symptoms include no decreased concentration, no fatigue, no appetite change, no myalgias, no headaches and no suicidal ideas. ? ?Chief Complaint  ?Patient presents with  ? Hypertension  ? Hyperlipidemia  ? Depression  ? ? ?Relevant past medical, surgical, family and social history reviewed and updated as indicated. Interim medical history since our last visit reviewed. ?Allergies and medications reviewed and updated. ? ?Review of Systems  ?Constitutional:  Negative for activity change, appetite change, chills, fatigue and fever.  ?HENT:  Negative for congestion, ear discharge, ear pain and facial swelling.   ?Eyes:  Negative for pain, discharge and itching.  ?Respiratory:  Negative for cough, chest tightness, shortness of breath and wheezing.   ?Cardiovascular:  Negative for chest pain, palpitations and leg swelling.  ?Gastrointestinal:  Negative for abdominal distention, abdominal pain, blood in stool, constipation, diarrhea, nausea and vomiting.  ?Endocrine: Negative for cold intolerance, heat intolerance, polydipsia, polyphagia and polyuria.  ?Genitourinary:  Negative for difficulty urinating, dysuria, flank pain, frequency, hematuria and urgency.   ?Musculoskeletal:  Negative for arthralgias, gait problem, joint swelling and myalgias.  ?Skin:  Negative for color change, rash and wound.  ?Neurological:  Negative for dizziness, tremors, speech difficulty, weakness, light-headedness, numbness and headaches.  ?Hematological:  Does not bruise/bleed easily.  ?Psychiatric/Behavioral:  Positive for depression. Negative for agitation, confusion, decreased concentration, sleep disturbance and suicidal ideas.   ? ?Per HPI unless specifically indicated above ? ?   ?Objective:  ?  ?BP 106/73   Pulse 69   Temp 98 ?F (36.7 ?C) (Oral)   Ht 6' 0.01" (1.829 m)   Wt 221 lb (100.2 kg)   SpO2 97%   BMI 29.97 kg/m?   ?Wt Readings from Last 3 Encounters:  ?03/29/22 221 lb (100.2 kg)  ?12/07/21 221 lb (100.2 kg)  ?11/17/21 212 lb (96.2 kg)  ?  ?Physical Exam ?Vitals and nursing note reviewed.  ?Constitutional:   ?   General: He is not in acute distress. ?   Appearance: Normal appearance. He is not ill-appearing or diaphoretic.  ?HENT:  ?   Head: Normocephalic and atraumatic.  ?   Right Ear: Tympanic membrane and external ear normal. There is no impacted cerumen.  ?   Left Ear: External ear normal.  ?   Nose: No congestion or rhinorrhea.  ?   Mouth/Throat:  ?   Pharynx: No oropharyngeal exudate or posterior oropharyngeal erythema.  ?Eyes:  ?   Conjunctiva/sclera: Conjunctivae normal.  ?   Pupils: Pupils are equal, round, and reactive to light.  ?Cardiovascular:  ?   Rate and Rhythm: Normal rate  and regular rhythm.  ?   Heart sounds: No murmur heard. ?  No friction rub. No gallop.  ?Pulmonary:  ?   Effort: No respiratory distress.  ?   Breath sounds: No stridor. No wheezing or rhonchi.  ?Chest:  ?   Chest wall: No tenderness.  ?Abdominal:  ?   General: Abdomen is flat. Bowel sounds are normal.  ?   Palpations: Abdomen is soft. There is no mass.  ?   Tenderness: There is no abdominal tenderness.  ?Musculoskeletal:  ?   Cervical back: Normal range of motion and neck supple. No  rigidity or tenderness.  ?   Left lower leg: No edema.  ?Skin: ?   General: Skin is warm and dry.  ?Neurological:  ?   Mental Status: He is alert.  ? ? ?Results for orders placed or performed in visit on 03/22/22  ?TSH  ?Result Value Ref Range  ? TSH 3.960 0.450 - 4.500 uIU/mL  ?Comprehensive metabolic panel  ?Result Value Ref Range  ? Glucose 100 (H) 70 - 99 mg/dL  ? BUN 25 8 - 27 mg/dL  ? Creatinine, Ser 1.31 (H) 0.76 - 1.27 mg/dL  ? eGFR 59 (L) >59 mL/min/1.73  ? BUN/Creatinine Ratio 19 10 - 24  ? Sodium 139 134 - 144 mmol/L  ? Potassium 4.6 3.5 - 5.2 mmol/L  ? Chloride 103 96 - 106 mmol/L  ? CO2 22 20 - 29 mmol/L  ? Calcium 9.4 8.6 - 10.2 mg/dL  ? Total Protein 6.5 6.0 - 8.5 g/dL  ? Albumin 4.2 3.8 - 4.8 g/dL  ? Globulin, Total 2.3 1.5 - 4.5 g/dL  ? Albumin/Globulin Ratio 1.8 1.2 - 2.2  ? Bilirubin Total 0.3 0.0 - 1.2 mg/dL  ? Alkaline Phosphatase 106 44 - 121 IU/L  ? AST 28 0 - 40 IU/L  ? ALT 18 0 - 44 IU/L  ?CBC with Differential/Platelet  ?Result Value Ref Range  ? WBC 7.8 3.4 - 10.8 x10E3/uL  ? RBC 4.89 4.14 - 5.80 x10E6/uL  ? Hemoglobin 14.3 13.0 - 17.7 g/dL  ? Hematocrit 44.5 37.5 - 51.0 %  ? MCV 91 79 - 97 fL  ? MCH 29.2 26.6 - 33.0 pg  ? MCHC 32.1 31.5 - 35.7 g/dL  ? RDW 13.5 11.6 - 15.4 %  ? Platelets 169 150 - 450 x10E3/uL  ? Neutrophils 34 Not Estab. %  ? Lymphs 54 Not Estab. %  ? Monocytes 6 Not Estab. %  ? Eos 5 Not Estab. %  ? Basos 1 Not Estab. %  ? Neutrophils Absolute 2.7 1.4 - 7.0 x10E3/uL  ? Lymphocytes Absolute 4.2 (H) 0.7 - 3.1 x10E3/uL  ? Monocytes Absolute 0.5 0.1 - 0.9 x10E3/uL  ? EOS (ABSOLUTE) 0.4 0.0 - 0.4 x10E3/uL  ? Basophils Absolute 0.0 0.0 - 0.2 x10E3/uL  ? Immature Granulocytes 0 Not Estab. %  ? Immature Grans (Abs) 0.0 0.0 - 0.1 x10E3/uL  ? ?   ? ? ?Current Outpatient Medications:  ?  apixaban (ELIQUIS) 5 MG TABS tablet, Take 1 tablet (5 mg total) by mouth 2 (two) times daily., Disp: 180 tablet, Rfl: 1 ?  aspirin EC 81 MG EC tablet, Take 1 tablet (81 mg total) by mouth daily at  6 (six) AM. Swallow whole., Disp: 90 tablet, Rfl: 3 ?  buPROPion (WELLBUTRIN SR) 200 MG 12 hr tablet, Take 1 tablet (200 mg total) by mouth 2 (two) times daily., Disp: 180 tablet, Rfl: 1 ?  Cholecalciferol  50 MCG (2000 UT) CAPS, Take 1 capsule by mouth daily. , Disp: , Rfl:  ?  citalopram (CELEXA) 20 MG tablet, Take 1 tablet (20 mg total) by mouth daily., Disp: 90 tablet, Rfl: 4 ?  ezetimibe (ZETIA) 10 MG tablet, Take 1 tablet (10 mg total) by mouth daily., Disp: 90 tablet, Rfl: 0 ?  levothyroxine (SYNTHROID) 75 MCG tablet, Take 1 tablet (75 mcg total) by mouth daily., Disp: 90 tablet, Rfl: 0 ?  melatonin 3 MG TABS tablet, Take 1 tablet (3 mg total) by mouth at bedtime., Disp: 30 tablet, Rfl: 1 ?  metoprolol tartrate (LOPRESSOR) 25 MG tablet, Take 0.5 tablets (12.5 mg total) by mouth 2 (two) times daily., Disp: 30 tablet, Rfl: 7 ?  omeprazole (PRILOSEC) 20 MG capsule, Take 1 capsule (20 mg total) by mouth daily., Disp: 90 capsule, Rfl: 0 ?  rosuvastatin (CRESTOR) 40 MG tablet, Take 1 tablet (40 mg total) by mouth daily., Disp: 90 tablet, Rfl: 0  ? ? ?Assessment & Plan:  ?Hypothyroism ? ?HYPOTHYROIDISM:  ?Chronic, ongoing with recent TSH within normal range.  Continue current medication regimen and adjust as needed.  Plan to recheck TSH at physical in 6 months. ?  ?PLEASE TAKE YOUR THYROID MEDICATION FIRST THING IN THE MORNING WHILST FASTING.  ?NO MEDICATION/ FOOD FOR AN HOUR AFTER INGESTING THYROID PILLS. ? ? HTN :  ?Continue current meds.  Medication compliance emphasised. pt advised to keep Bp logs. Pt verbalised understanding of the same. Pt to have a low salt diet . Exercise to reach a goal of at least 150 mins a week.  lifestyle modifications explained and pt understands importance of the above. ?Under good control on current regimen. Continue current regimen. Continue to monitor. Call with any concerns. Refills given. Labs drawn today. ? ?Atrial fibrillation stable, sees Cards fro such Dr. Fletcher Anon is o b  blockers , eliquis and ? ASA ?CAD stable, chornic.  ?HAS had CABG  ? ?Depression on celexa 20 mg daily and wellbutrin 200 mg bid ? ?HLD is on crestor and zetia. ?recheck FLP, check LFT's work on diet, SE of meds explained to pt. low fa

## 2022-03-29 NOTE — Telephone Encounter (Signed)
Per Dr. Tyrell Antonio Nov 2022 o/v ? ?"He was placed on low-dose aspirin after his endovascular repair of iliac artery aneurysm.  We will consider stopping this in the next 6 months given that he is on anticoagulation." ? ?Message fwd to Dr. Fletcher Anon to advise. ?

## 2022-03-30 LAB — LIPID PANEL
Chol/HDL Ratio: 2.9 ratio (ref 0.0–5.0)
Cholesterol, Total: 142 mg/dL (ref 100–199)
HDL: 49 mg/dL (ref >39)
LDL Chol Calc (NIH): 74 mg/dL (ref 0–99)
Triglycerides: 105 mg/dL (ref 0–149)
VLDL Cholesterol Cal: 19 mg/dL (ref 5–40)

## 2022-03-30 NOTE — Telephone Encounter (Signed)
He can stop aspirin

## 2022-03-31 NOTE — Telephone Encounter (Signed)
Patient made aware of Dr. Arida's response. Patient verbalized understanding and voiced appreciation for the call. 

## 2022-04-09 ENCOUNTER — Other Ambulatory Visit: Payer: Self-pay | Admitting: Cardiovascular Disease

## 2022-05-04 ENCOUNTER — Other Ambulatory Visit: Payer: Self-pay | Admitting: Nurse Practitioner

## 2022-05-04 ENCOUNTER — Other Ambulatory Visit: Payer: Self-pay | Admitting: Internal Medicine

## 2022-05-04 NOTE — Telephone Encounter (Signed)
Requested Prescriptions  Pending Prescriptions Disp Refills  . buPROPion (WELLBUTRIN SR) 200 MG 12 hr tablet [Pharmacy Med Name: BUPROPION HCL SR 200 MG TABLET] 180 tablet 0    Sig: Take 1 tablet (200 mg total) by mouth 2 (two) times daily.     Psychiatry: Antidepressants - bupropion Failed - 05/04/2022 12:23 PM      Failed - Cr in normal range and within 360 days    Creatinine  Date Value Ref Range Status  02/20/2015 1.37 (H) mg/dL Final    Comment:    0.61-1.24 NOTE: New Reference Range  01/20/15    Creatinine, Ser  Date Value Ref Range Status  03/22/2022 1.31 (H) 0.76 - 1.27 mg/dL Final         Passed - AST in normal range and within 360 days    AST  Date Value Ref Range Status  03/22/2022 28 0 - 40 IU/L Final   SGOT(AST)  Date Value Ref Range Status  12/13/2014 30 15 - 37 Unit/L Final         Passed - ALT in normal range and within 360 days    ALT  Date Value Ref Range Status  03/22/2022 18 0 - 44 IU/L Final   SGPT (ALT)  Date Value Ref Range Status  12/13/2014 20 14 - 63 U/L Final         Passed - Completed PHQ-2 or PHQ-9 in the last 360 days      Passed - Last BP in normal range    BP Readings from Last 1 Encounters:  03/29/22 106/73         Passed - Valid encounter within last 6 months    Recent Outpatient Visits          1 month ago Hyperlipidemia, unspecified hyperlipidemia type   [facility] Vigg, Avanti, MD   7 months ago Screen for colon cancer   [facility] Vigg, Avanti, MD   9 months ago Screening for colon cancer   [facility] Vigg, Avanti, MD   1 year ago 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

## 2022-05-04 NOTE — Telephone Encounter (Signed)
Requested Prescriptions  Pending Prescriptions Disp Refills  . citalopram (CELEXA) 40 MG tablet [Pharmacy Med Name: CITALOPRAM HBR 40 MG TABLET] 90 tablet 0    Sig: Take 1 tablet (40 mg total) by mouth daily.     Psychiatry:  Antidepressants - SSRI Passed - 05/04/2022 12:24 PM      Passed - Completed PHQ-2 or PHQ-9 in the last 360 days      Passed - Valid encounter within last 6 months    Recent Outpatient Visits          1 month ago Hyperlipidemia, unspecified hyperlipidemia type   [facility] Vigg, Avanti, MD   7 months ago Screen for colon cancer   [place] Vigg, Avanti, MD   9 months ago Screening for colon cancer   [facility] Vigg, Avanti, MD   1 year ago Hypertensive heart and kidney disease without heart failure and with stage 3a chronic kidney disease ([place])   [place], [name], NP   1 year ago Major depressive disorder with single episode, in partial remission ([place])   [place], Barbaraann Faster, NP      Future Appointments            In 1 month Arida, Mertie Clause, MD [place], [facility]   In 3 months  [facility], [place]   In 5 months Kathrine Haddock, NP [facility], [place]

## 2022-05-05 ENCOUNTER — Other Ambulatory Visit: Payer: Self-pay | Admitting: Cardiovascular Disease

## 2022-06-01 ENCOUNTER — Other Ambulatory Visit: Payer: Self-pay | Admitting: Cardiovascular Disease

## 2022-06-01 ENCOUNTER — Telehealth: Payer: Self-pay | Admitting: Cardiovascular Disease

## 2022-06-01 DIAGNOSIS — I480 Paroxysmal atrial fibrillation: Secondary | ICD-10-CM

## 2022-06-01 NOTE — Telephone Encounter (Signed)
Prescription refill request for Eliquis received. Indication: PAF Last office visit: 09/17/21  Rod Can MD Scr: 1.31 on 03/22/22 Age: 71 Weight: 97.3kg  Based on above findings Eliquis '5mg'$  twice daily is the appropriate dose.  Refill approved.

## 2022-06-01 NOTE — Telephone Encounter (Signed)
*  STAT* If patient is at the pharmacy, call can be transferred to refill team.  1. Which medications need to be refilled? (please list name of each medication and dose if known)   apixaban (ELIQUIS) 5 MG TABS tablet  2. Which pharmacy/location (including street and city if local pharmacy) is medication to be sent to?  [place], [place] - [address]  3. Do they need a 30 day or 90 day supply?    Patient called wanting assistance with getting this medication.  Patient stated this medication was too expensive and is looking to get samples.

## 2022-06-01 NOTE — Telephone Encounter (Signed)
refill 

## 2022-06-01 NOTE — Telephone Encounter (Signed)
Patient is requesting Eliquis 5 MG. Patient is requesting samples due to the medication being to expensive.

## 2022-06-02 ENCOUNTER — Other Ambulatory Visit: Payer: Self-pay | Admitting: Nurse Practitioner

## 2022-06-02 NOTE — Telephone Encounter (Signed)
Spoke with the patient. Adv the patient the samples are reserved for new starts but I can leave a few at our front desk for him to pick up. Offered to include a patient assistance application for Eliquis. Adv the patient to complete the application, attach the required documentation and return to our office.  Patient voiced appreciation for the assistance.   Medication Samples have been provided to the patient.  Drug name: Eliquis        Strength: 5 mg         Qty: 2 boxes   LOT: [id number]   Exp.Date: 01/2024  Dosing instructions: 1 tablet by mouth twice a day  The patient has been instructed regarding the correct time, dose, and frequency of taking this medication, including desired effects and most common side effects.   Stuart Harvey 9:14 AM 06/02/2022

## 2022-06-03 NOTE — Telephone Encounter (Signed)
Requested medication (s) are due for refill today - yes  Requested medication (s) are on the active medication list -yes  Future visit scheduled -no  Last refill: 02/24/22 #90  Notes to clinic: Former patient - Dr Neomia Dear  Requested Prescriptions  Pending Prescriptions Disp Refills   omeprazole (PRILOSEC) 20 MG capsule [Pharmacy Med Name: OMEPRAZOLE DR 20 MG CAPSULE] 90 capsule 0    Sig: Take 1 capsule (20 mg total) by mouth daily.     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 with stage 3a chronic kidney disease ([place])   [place], [name], NP   1 year ago Major depressive disorder with single episode, in partial remission ([place])   [place], Barbaraann Faster, NP       Future Appointments             In 2 weeks Arida, Mertie Clause, MD [place], [facility]   In 2 months  [facility], [place]   In 4 months Kathrine Haddock, NP [facility], [facility]               Requested Prescriptions  Pending Prescriptions Disp Refills   omeprazole (PRILOSEC) 20 MG capsule [Pharmacy Med Name: OMEPRAZOLE DR 20 MG CAPSULE] 90 capsule 0    Sig: Take 1 capsule (20 mg total) by mouth daily.     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

## 2022-06-10 ENCOUNTER — Ambulatory Visit (INDEPENDENT_AMBULATORY_CARE_PROVIDER_SITE_OTHER): Payer: Medicare HMO

## 2022-06-10 DIAGNOSIS — I7143 Infrarenal abdominal aortic aneurysm, without rupture: Secondary | ICD-10-CM

## 2022-06-17 ENCOUNTER — Telehealth: Payer: Self-pay

## 2022-06-17 ENCOUNTER — Encounter: Payer: Self-pay | Admitting: Cardiovascular Disease

## 2022-06-17 ENCOUNTER — Ambulatory Visit (INDEPENDENT_AMBULATORY_CARE_PROVIDER_SITE_OTHER): Payer: Medicare HMO | Admitting: Cardiovascular Disease

## 2022-06-17 VITALS — BP 106/80 | HR 72 | Ht 72.0 in | Wt 218.8 lb

## 2022-06-17 DIAGNOSIS — I10 Essential (primary) hypertension: Secondary | ICD-10-CM

## 2022-06-17 DIAGNOSIS — I25118 Atherosclerotic heart disease of native coronary artery with other forms of angina pectoris: Secondary | ICD-10-CM

## 2022-06-17 DIAGNOSIS — E785 Hyperlipidemia, unspecified: Secondary | ICD-10-CM

## 2022-06-17 DIAGNOSIS — R0602 Shortness of breath: Secondary | ICD-10-CM

## 2022-06-17 DIAGNOSIS — I480 Paroxysmal atrial fibrillation: Secondary | ICD-10-CM | POA: Diagnosis not present

## 2022-06-17 DIAGNOSIS — Z8679 Personal history of other diseases of the circulatory system: Secondary | ICD-10-CM

## 2022-06-17 DIAGNOSIS — R079 Chest pain, unspecified: Secondary | ICD-10-CM

## 2022-06-17 DIAGNOSIS — Z9889 Other specified postprocedural states: Secondary | ICD-10-CM

## 2022-06-17 NOTE — Telephone Encounter (Signed)
Medication Samples have been provided to the patient.  Drug name: Eliquis       Strength: '5MG'$         Qty: 28  LOT: [id number]  Exp.Date: 02-2024   Nestor Ramp 11:19 AM 06/17/2022

## 2022-06-17 NOTE — Progress Notes (Signed)
Cardiology Office Note   Date:  06/17/2022   ID:  Stuart Harvey, DOB [birth year], MRN [id number]  PCP:  Charlynne Cousins, MD  Cardiologist:   [name], MD   Chief Complaint  Patient presents with   Follow-up    9 month follow up, No new Cardiac concers      History of Present Illness: Stuart Harvey is a 71 y.o. male who presents fora followup visit regarding coronary artery disease.  He has known history of severe three-vessel coronary artery disease diagnosed in 2008 as well as a large infrarenal abdominal aortic aneurysm. He underwent coronary artery bypass graft surgery in August 2008 at [facility]. He underwent open surgical repair of aortic aneurysm in October of 2008.    Aortic aneurysm followup is done  by Dr. Lucky Cowboy.  He was hospitalized in July of 2019 with unresponsiveness.  He reportedly was down for about 1 hour with possible seizure-like activities.  He had numerous contusions.  Per EMS he was in ventricular tachycardia upon arrival and was cardioverted.  He was briefly intubated and was found to have mildly elevated troponin.  CPK was close to 4000.  Echo showed an EF of 50 to 55%.  He underwent cardiac catheterization which showed severe underlying three-vessel coronary artery disease with patent grafts including LIMA to LAD, SVG to RCA, SVG to diagonal and SVG to OM 3.  There was borderline disease in the SVG to OM but that was left to be treated medically given that it was supplying a small area.  The patient did develop atrial fibrillation while hospitalized.  He converted spontaneously.  His presentation was felt to be possibly triggered by heat stroke.  He underwent endovascular repair of iliac artery aneurysm in September of 2022.  He was   He has been doing reasonably well but reports worsening exertional dyspnea and occasional wheezing.  In addition, he had some episodes of substernal chest pain which is left-sided described as aching sensation.   Past Medical  History:  Diagnosis Date   AAA (abdominal aortic aneurysm) ([place])    a.) s/p EVAR in 08/2007 by Dr. Hulda Humphrey. b.) CTA on 06/30/2021 --> interval aneurysmal dilitation superior to stent graft with the juxtarenal aorta measuring 3.8 cm.   Anemia    Arthritis    both knees   Chronic anticoagulation    Apixaban   CKD (chronic kidney disease), stage II    Coronary artery disease    a.) 4v CABG @ [facility] 06/2007 (LIMA-LAD, VG-RPDA, VG-OM3,VG-D2); b.) LHC 2011: patent grafts EF 40%, c.) LHC 06/05/18: LM nl, pLAD 50%, p-mLAD 50%, ostD1 70%, ost ramus 30%, ost-pLCx 40%, pLCx 100% (chronically occluded), OM3 100%, p-dRCA 100% (chronically occluded), LIMA-LAD patent, VG-RPDA patent, VG-OM3 mid-graft 60% and 80%   Depression    GERD (gastroesophageal reflux disease)    Hernia of abdominal cavity    History of 2019 novel coronavirus disease (COVID-19) 09/2019   Hyperlipidemia    Hypertension    Hypothyroidism    Iliac artery aneurysm, right ([place])    a.) at the distal landing zone of the right iliac limb measuring 3 cm   Right middle lobe pulmonary nodule 06/30/2021   a.) measured 8 mm by CT on 06/30/2021.   S/P CABG x 4 06/25/2007   a.) LIMA-LAD, SVG-RPDA, SVG-OM3, SVG-D2   Sigmoid diverticulosis    Ventricular tachycardia ([place]) 05/2018   a.) found unresponsive by EMS; VT noted --> defibrillation achieved ROSC. Briefly intubated. Episode felt  to be secondary to heat stoke.    Past Surgical History:  Procedure Laterality Date   ABDOMINAL AORTIC ANEURYSM REPAIR N/A 08/29/2007   Procedure: EVAR; Location: [place]; Surgeon: Collene Schlichter, MD   ABDOMINAL AORTIC ANEURYSM REPAIR N/A 12/05/2012   Procedure: Abdominal aortic aneurysm of approximately 6 cm in maximal diameter status post previous endovascular repair with type I and III endoleaks; Location: [place]; Surgeon: Leotis Pain, MD   CARDIAC CATHETERIZATION Left 06/28/2010   3v CAD with patent CABG grafts; LVEF 40%; stable aortic stent graft; Location: [place];  Surgeon: Serafina Royals, MD   CARDIAC CATHETERIZATION Left 06/18/2007   3v CAD; LVEF 50%; refer to CVTS for CABG; Location: [place]; Surgeon: [name], MD   COLONOSCOPY WITH PROPOFOL N/A 11/17/2021   Procedure: COLONOSCOPY WITH PROPOFOL;  Surgeon: Lin Landsman, MD;  Location: [facility] ENDOSCOPY;  Service: Gastroenterology;  Laterality: N/A;   CORONARY ARTERY BYPASS GRAFT N/A 06/25/2007   Procedure: 4v CABG (LIMA-LAD, SVG-RPDA, SVG-OM3, SVG-D2); Location: [facility]; Surgeon: Marnee Guarneri, MD   CORONARY/GRAFT ANGIOGRAPHY N/A 06/05/2018   Procedure: Remus Blake ANGIOGRAPHY;  Surgeon: Nelva Bush, MD;  Location: [place] CV LAB;  Service: Cardiovascular;  Laterality: N/A;   ENDOVASCULAR REPAIR/STENT GRAFT N/A 08/04/2021   Procedure: ENDOVASCULAR REPAIR/STENT GRAFT;  Surgeon: Algernon Huxley, MD;  Location: [place] CV LAB;  Service: Cardiovascular;  Laterality: N/A;   EXPLORATORY LAPAROTOMY  1997   HERNIA REPAIR     KNEE ARTHROPLASTY Left 09/19/2016   Procedure: COMPUTER ASSISTED TOTAL KNEE ARTHROPLASTY;  Surgeon: Dereck Leep, MD;  Location: [facility] ORS;  Service: Orthopedics;  Laterality: Left;   TOTAL KNEE ARTHROPLASTY Right 09/2016     Current Outpatient Medications  Medication Sig Dispense Refill   apixaban (ELIQUIS) 5 MG TABS tablet Take 1 tablet (5 mg total) by mouth 2 (two) times daily. 180 tablet 1   buPROPion (WELLBUTRIN SR) 200 MG 12 hr tablet Take 1 tablet (200 mg total) by mouth 2 (two) times daily. 180 tablet 0   Cholecalciferol 50 MCG (2000 UT) CAPS Take 1 capsule by mouth daily.      citalopram (CELEXA) 20 MG tablet Take 1 tablet (20 mg total) by mouth daily. 90 tablet 4   ezetimibe (ZETIA) 10 MG tablet Take 1 tablet (10 mg total) by mouth daily. 90 tablet 0   levothyroxine (SYNTHROID) 75 MCG tablet Take 1 tablet (75 mcg total) by mouth daily. 90 tablet 0   melatonin 3 MG TABS tablet Take 1 tablet (3 mg total) by mouth at bedtime. 30 tablet 1   metoprolol tartrate  (LOPRESSOR) 25 MG tablet Take 0.5 tablets (12.5 mg total) by mouth 2 (two) times daily. 180 tablet 3   omeprazole (PRILOSEC) 20 MG capsule Take 1 capsule (20 mg total) by mouth daily. 90 capsule 0   rosuvastatin (CRESTOR) 40 MG tablet Take 1 tablet (40 mg total) by mouth daily. 90 tablet 0   citalopram (CELEXA) 40 MG tablet Take 1 tablet (40 mg total) by mouth daily. (Patient not taking: Reported on 06/17/2022) 90 tablet 1   No current facility-administered medications for this visit.    Allergies:   Benzodiazepines, Codeine, and Tetracycline    Social History:  The patient  reports that he quit smoking about 16 years ago. His smoking use included cigarettes. He has a 35.00 pack-year smoking history. His smokeless tobacco use includes chew. He reports that he does not drink alcohol and does not use drugs.   Family History:  The patient's family history  includes Depression in his maternal grandfather and maternal grandmother; Heart attack in his father, mother, and another family member; Heart disease in his mother and another family member; Hypertension in his father and mother; Kidney disease in his brother.    ROS:  Please see the history of present illness.   Otherwise, review of systems are positive for none.   All other systems are reviewed and negative.    PHYSICAL EXAM: VS:  BP 106/80 (BP Location: Left Arm, Patient Position: Sitting, Cuff Size: Large)   Pulse 72   Ht 6' (1.829 m)   Wt 218 lb 12.8 oz (99.2 kg)   SpO2 97%   BMI 29.67 kg/m  , BMI Body mass index is 29.67 kg/m. GEN: Well nourished, well developed, in no acute distress  HEENT: normal  Neck: no JVD, carotid bruits, or masses Cardiac: RRR; no murmurs, rubs, or gallops,no edema  Respiratory:  clear to auscultation bilaterally, normal work of breathing GI: soft, nontender, nondistended, + BS MS: no deformity or atrophy  Skin: warm and dry, no rash Neuro:  Strength and sensation are intact Psych: euthymic mood, full  affect   EKG:  EKG is ordered today. The ekg ordered today demonstrates normal sinus rhythm with left anterior fascicular block with anterolateral T wave changes suggestive of ischemia.    Recent Labs: 03/22/2022: ALT 18; BUN 25; Creatinine, Ser 1.31; Hemoglobin 14.3; Platelets 169; Potassium 4.6; Sodium 139; TSH 3.960    Lipid Panel    Component Value Date/Time   CHOL 142 03/29/2022 0913   CHOL 159 12/18/2015 0827   TRIG 105 03/29/2022 0913   TRIG 166 (H) 12/18/2015 0827   HDL 49 03/29/2022 0913   CHOLHDL 2.9 03/29/2022 0913   CHOLHDL 3.4 09/25/2018 1208   VLDL 31 09/25/2018 1208   VLDL 33 (H) 12/18/2015 0827   LDLCALC 74 03/29/2022 0913      Wt Readings from Last 3 Encounters:  06/17/22 218 lb 12.8 oz (99.2 kg)  03/29/22 221 lb (100.2 kg)  12/07/21 221 lb (100.2 kg)       ASSESSMENT AND PLAN:  1.  Coronary artery disease involving native coronary arteries with other forms of angina: He reports worsening exertional dyspnea and intermittent episodes of chest pain.  Most recent cardiac catheterization in 2019 showed diffuse disease in the SVG to OM.  No ischemic cardiac evaluation since then.  I recommend evaluation with a Lexiscan Myoview.   I will also obtain an echocardiogram to evaluate LV systolic and diastolic function.  2.  Paroxysmal atrial fibrillation: Currently in sinus rhythm.  Continue small dose metoprolol and anticoagulation with Eliquis.  I reviewed his labs done in May which showed stable creatinine at 1.3 and normal CBC.  3.  Essential hypertension: Blood pressure is controlled on small dose metoprolol.  4. Hyperlipidemia: I reviewed most recent lipid profile done in May which showed an LDL of 74 and triglyceride of 105.  Continue treatment with ezetimibe and rosuvastatin.  LDL was close to target.  5. Abdominal aortic aneurysm/iliac artery aneurysm: Status post repair. This is followed by Dr. Lucky Cowboy.     Disposition:   Proceed with a Lexiscan Myoview  and an echocardiogram and follow-up after.  Signed,  [name], MD  06/17/2022 11:25 AM    [place]

## 2022-06-17 NOTE — Patient Instructions (Signed)
Medication Instructions:  Your physician recommends that you continue on your current medications as directed. Please refer to the Current Medication list given to you today.  *If you need a refill on your cardiac medications before your next appointment, please call your pharmacy*   Lab Work: None ordered If you have labs (blood work) drawn today and your tests are completely normal, you will receive your results only by: [place] (if you have MyChart) OR A paper copy in the mail If you have any lab test that is abnormal or we need to change your treatment, we will call you to review the results.   Testing/Procedures: Your physician has requested that you have an echocardiogram. Echocardiography is a painless test that uses sound waves to create images of your heart. It provides your doctor with information about the size and shape of your heart and how well your heart's chambers and valves are working. This procedure takes approximately one hour. There are no restrictions for this procedure.  Your physician has requested that you have a lexiscan myoview. For further information please visit [email or web address]. Please follow instruction sheet, as given.    Follow-Up: At [facility], you and your health needs are our priority.  As part of our continuing mission to provide you with exceptional heart care, we have created designated Provider Care Teams.  These Care Teams include your primary Cardiologist (physician) and Advanced Practice Providers (APPs -  Physician Assistants and Nurse Practitioners) who all work together to provide you with the care you need, when you need it.  We recommend signing up for the patient portal called "MyChart".  Sign up information is provided on this After Visit Summary.  MyChart is used to connect with patients for Virtual Visits (Telemedicine).  Patients are able to view lab/test results, encounter notes, upcoming appointments, etc.  Non-urgent  messages can be sent to your provider as well.   To learn more about what you can do with MyChart, go to [email or web address].    Your next appointment:   After testing  The format for your next appointment:   In Person  Provider:   You may see [name], MD or one of the following Advanced Practice Providers on your designated Care Team:   Murray Hodgkins, NP Christell Faith, PA-C Cadence Kathlen Mody, PA-C{     Other Instructions [place]  Your caregiver has ordered a Stress Test with nuclear imaging. The purpose of this test is to evaluate the blood supply to your heart muscle. This procedure is referred to as a "Non-Invasive Stress Test." This is because other than having an IV started in your vein, nothing is inserted or "invades" your body. Cardiac stress tests are done to find areas of poor blood flow to the heart by determining the extent of coronary artery disease (CAD). Some patients exercise on a treadmill, which naturally increases the blood flow to your heart, while others who are  unable to walk on a treadmill due to physical limitations have a pharmacologic/chemical stress agent called Lexiscan . This medicine will mimic walking on a treadmill by temporarily increasing your coronary blood flow.   Please note: these test may take anywhere between 2-4 hours to complete  PLEASE REPORT TO [place] AT THE FIRST DESK WILL DIRECT YOU WHERE TO GO  Date of Procedure:_____________________________________  Arrival Time for Procedure:______________________________   PLEASE NOTIFY THE OFFICE AT LEAST 24 HOURS IN ADVANCE IF YOU ARE UNABLE TO KEEP YOUR  APPOINTMENT.  [phone] AND  PLEASE NOTIFY NUCLEAR MEDICINE AT [facility] AT LEAST 24 HOURS IN ADVANCE IF YOU ARE UNABLE TO KEEP YOUR APPOINTMENT. [phone]  How to prepare for your Myoview test:  Do not eat or drink after midnight No caffeine for 24 hours prior to test No smoking 24 hours  prior to test. Your medication may be taken with water.  If your doctor stopped a medication because of this test, do not take that medication. Please wear a short sleeve shirt. No  cologne or lotion. Wear comfortable walking shoes.       Important Information About Sugar

## 2022-06-23 ENCOUNTER — Encounter
Admission: RE | Admit: 2022-06-23 | Discharge: 2022-06-23 | Disposition: A | Discharge status: Home / Self Care | Payer: Medicare HMO | Source: Ambulatory Visit | Attending: Cardiovascular Disease | Admitting: Cardiovascular Disease

## 2022-06-23 DIAGNOSIS — R079 Chest pain, unspecified: Secondary | ICD-10-CM | POA: Diagnosis not present

## 2022-06-23 MED ORDER — REGADENOSON 0.4 MG/5ML IV SOLN: 0.4000 mg | Freq: Once | INTRAVENOUS | Status: DC

## 2022-06-23 MED ORDER — TECHNETIUM TC 99M TETROFOSMIN IV KIT
30.0000 | PACK | Freq: Once | INTRAVENOUS | Status: AC
  Administered 2022-06-23: 32.48 via INTRAVENOUS

## 2022-06-23 MED ORDER — REGADENOSON 0.4 MG/5ML IV SOLN
0.4000 mg | Freq: Once | INTRAVENOUS | Status: AC
  Administered 2022-06-23: 0.4 mg via INTRAVENOUS

## 2022-06-23 MED ORDER — TECHNETIUM TC 99M TETROFOSMIN IV KIT
9.7500 | PACK | Freq: Once | INTRAVENOUS | Status: AC | PRN
  Administered 2022-06-23: 9.75 via INTRAVENOUS

## 2022-06-24 ENCOUNTER — Encounter: Payer: Self-pay | Admitting: Cardiovascular Disease

## 2022-06-24 LAB — NM MYOCAR MULTI W/SPECT W/WALL MOTION / EF
LV dias vol: 114 mL (ref 62–150)
LV sys vol: 71 mL
Nuc Stress EF: 38 %
Peak HR: 75 {beats}/min
Rest HR: 63 {beats}/min
Rest Nuclear Isotope Dose: 9.8 mCi
SDS: 0
SRS: 19
SSS: 7
ST Depression (mm): 0 mm
Stress Nuclear Isotope Dose: 32.5 mCi
TID: 1.07

## 2022-06-28 ENCOUNTER — Other Ambulatory Visit: Payer: Self-pay | Admitting: Internal Medicine

## 2022-06-28 MED ORDER — EZETIMIBE 10 MG PO TABS: 10.0000 mg | ORAL_TABLET | Freq: Every day | ORAL | 0 refills | Status: DC

## 2022-06-28 NOTE — Telephone Encounter (Signed)
Medication Refill - Medication: Zetia 10 mg  Has the patient contacted their pharmacy? Yes.   (Agent: If no, request that the patient contact the pharmacy for the refill. If patient does not wish to contact the pharmacy document the reason why and proceed with request.) (Agent: If yes, when and what did the pharmacy advise?)  Preferred Pharmacy (with phone number or street name): [address] Has the patient been seen for an appointment in the last year OR does the patient have an upcoming appointment? Yes.    Agent: Please be advised that RX refills may take up to 3 business days. We ask that you follow-up with your pharmacy.

## 2022-06-28 NOTE — Telephone Encounter (Signed)
Previous Dr. Neomia Dear patient. Patient last seen in May and has appointment in November.

## 2022-06-28 NOTE — Telephone Encounter (Signed)
Notes to clinic:  Dr. Levada Dy pt      Requested Prescriptions  Pending Prescriptions Disp Refills   ezetimibe (ZETIA) 10 MG tablet 90 tablet 0    Sig: Take 1 tablet (10 mg total) by mouth daily.     Cardiovascular:  Antilipid - Sterol Transport Inhibitors Failed - 06/28/2022  1:25 PM      Failed - Lipid Panel in normal range within the last 12 months    Cholesterol, Total  Date Value Ref Range Status  03/29/2022 142 100 - 199 mg/dL Final   Cholesterol Piccolo, Waived  Date Value Ref Range Status  12/18/2015 159 <200 mg/dL Final    Comment:                            Desirable                <200                         Borderline High      200- 239                         High                     >239    LDL Chol Calc (NIH)  Date Value Ref Range Status  03/29/2022 74 0 - 99 mg/dL Final   HDL  Date Value Ref Range Status  03/29/2022 49 >39 mg/dL Final   Triglycerides  Date Value Ref Range Status  03/29/2022 105 0 - 149 mg/dL Final   Triglycerides Piccolo,Waived  Date Value Ref Range Status  12/18/2015 166 (H) <150 mg/dL Final    Comment:                            Normal                   <150                         Borderline High     150 - 199                         High                200 - 499                         Very High                >499          Passed - AST in normal range and within 360 days    AST  Date Value Ref Range Status  03/22/2022 28 0 - 40 IU/L Final   SGOT(AST)  Date Value Ref Range Status  12/13/2014 30 15 - 37 Unit/L Final         Passed - ALT in normal range and within 360 days    ALT  Date Value Ref Range Status  03/22/2022 18 0 - 44 IU/L Final   SGPT (ALT)  Date Value Ref Range Status  12/13/2014 20 14 - 63 U/L Final         Passed - Patient is not pregnant  Passed - Valid encounter within last 12 months    Recent Outpatient Visits           3 months ago Hyperlipidemia, unspecified hyperlipidemia type    [facility] Vigg, Avanti, MD   9 months ago Screen for colon cancer   [place] Vigg, Avanti, MD   11 months ago Screening for colon cancer   [facility] Vigg, Avanti, MD   1 year ago 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

## 2022-07-01 ENCOUNTER — Encounter: Payer: Self-pay | Admitting: Family

## 2022-07-20 ENCOUNTER — Ambulatory Visit: Payer: Medicare HMO | Attending: Cardiovascular Disease

## 2022-07-20 DIAGNOSIS — R0602 Shortness of breath: Secondary | ICD-10-CM | POA: Diagnosis not present

## 2022-07-20 LAB — ECHOCARDIOGRAM COMPLETE
AR max vel: 3.73 cm2
AV Area VTI: 3.52 cm2
AV Area mean vel: 3.76 cm2
AV Mean grad: 2 mmHg
AV Peak grad: 4.2 mmHg
Ao pk vel: 1.03 m/s
Area-P 1/2: 1.48 cm2
S' Lateral: 4.6 cm

## 2022-07-20 MED ORDER — PERFLUTREN LIPID MICROSPHERE
1.0000 mL | INTRAVENOUS | Status: AC | PRN
  Administered 2022-07-20: 2 mL via INTRAVENOUS

## 2022-07-25 ENCOUNTER — Ambulatory Visit: Payer: Medicare HMO | Attending: Physician Assistant | Admitting: Physician Assistant

## 2022-07-25 ENCOUNTER — Other Ambulatory Visit
Admission: RE | Admit: 2022-07-25 | Discharge: 2022-07-25 | Disposition: A | Discharge status: Home / Self Care | Payer: Medicare HMO | Source: Ambulatory Visit | Attending: Physician Assistant | Admitting: Physician Assistant

## 2022-07-25 ENCOUNTER — Encounter: Payer: Self-pay | Admitting: Physician Assistant

## 2022-07-25 VITALS — BP 124/84 | HR 72 | Ht 71.0 in | Wt 221.0 lb

## 2022-07-25 DIAGNOSIS — I10 Essential (primary) hypertension: Secondary | ICD-10-CM

## 2022-07-25 DIAGNOSIS — I5020 Unspecified systolic (congestive) heart failure: Secondary | ICD-10-CM | POA: Diagnosis not present

## 2022-07-25 DIAGNOSIS — I7143 Infrarenal abdominal aortic aneurysm, without rupture: Secondary | ICD-10-CM | POA: Diagnosis present

## 2022-07-25 DIAGNOSIS — I77810 Thoracic aortic ectasia: Secondary | ICD-10-CM | POA: Diagnosis present

## 2022-07-25 DIAGNOSIS — I480 Paroxysmal atrial fibrillation: Secondary | ICD-10-CM

## 2022-07-25 DIAGNOSIS — E785 Hyperlipidemia, unspecified: Secondary | ICD-10-CM

## 2022-07-25 DIAGNOSIS — I25118 Atherosclerotic heart disease of native coronary artery with other forms of angina pectoris: Secondary | ICD-10-CM

## 2022-07-25 DIAGNOSIS — Z9889 Other specified postprocedural states: Secondary | ICD-10-CM | POA: Insufficient documentation

## 2022-07-25 DIAGNOSIS — Z8679 Personal history of other diseases of the circulatory system: Secondary | ICD-10-CM | POA: Diagnosis present

## 2022-07-25 DIAGNOSIS — I723 Aneurysm of iliac artery: Secondary | ICD-10-CM

## 2022-07-25 DIAGNOSIS — I208 Other forms of angina pectoris: Secondary | ICD-10-CM

## 2022-07-25 DIAGNOSIS — R0609 Other forms of dyspnea: Secondary | ICD-10-CM

## 2022-07-25 LAB — BASIC METABOLIC PANEL
Anion gap: 7 (ref 5–15)
BUN: 29 mg/dL — ABNORMAL HIGH (ref 8–23)
CO2: 24 mmol/L (ref 22–32)
Calcium: 9.3 mg/dL (ref 8.9–10.3)
Chloride: 109 mmol/L (ref 98–111)
Creatinine, Ser: 1.15 mg/dL (ref 0.61–1.24)
GFR, Estimated: >60 mL/min (ref >60)
Glucose, Bld: 112 mg/dL — ABNORMAL HIGH (ref 70–99)
Potassium: 5 mmol/L (ref 3.5–5.1)
Sodium: 140 mmol/L (ref 135–145)

## 2022-07-25 LAB — CBC
HCT: 46 % (ref 39.0–52.0)
Hemoglobin: 15 g/dL (ref 13.0–17.0)
MCH: 29.1 pg (ref 26.0–34.0)
MCHC: 32.6 g/dL (ref 30.0–36.0)
MCV: 89.1 fL (ref 80.0–100.0)
Platelets: 154 10*3/uL (ref 150–400)
RBC: 5.16 MIL/uL (ref 4.22–5.81)
RDW: 14.4 % (ref 11.5–15.5)
WBC: 10.1 10*3/uL (ref 4.0–10.5)
nRBC: 0 % (ref 0.0–0.2)

## 2022-07-25 MED ORDER — CARVEDILOL 3.125 MG PO TABS: 3.1250 mg | ORAL_TABLET | Freq: Two times a day (BID) | ORAL | 3 refills | Status: DC

## 2022-07-25 NOTE — Progress Notes (Signed)
Cardiology Office Note    Date:  07/25/2022   ID:  Stuart Harvey, DOB 1951-07-07, MRN [id number]  PCP:  [facility]  Cardiologist:  [name], MD  Electrophysiologist:  None   Chief Complaint: Follow-up  History of Present Illness:   Stuart Harvey is a 71 y.o. male with history of CAD s/p 4-vessel CABG in 06/2007 at [facility], HFrEF, large infrarenal AAA s/p open surgical repair in 08/2007, right iliac artery aneurysm status post endovascular repair in 07/2021, PAF on Eliquis, CKD stage II, HTN, HLD, hypothyroidism, anemia, depression, and GERD who presents for follow-up of Lexiscan MPI and echo.  He was hospitalized in 05/2018 with unresponsiveness.  He was reportedly down for 1 hour with possible seizure-like activities.  He had numerous contusions.  Per EMS, he was in ventricular tachycardia upon arrival and was cardioverted.  He was briefly intubated and found to have mildly elevated troponin.  CPK was close to 4000.  Echo showed an EF of 50 to 55%.  LHC showed severe underlying three-vessel CAD with patent grafts including LIMA to LAD, SVG to RCA, SVG to diagonal, and SVG to OM3.  There was borderline disease in the SVG to OM, though that was left to be treated medically given it was supplying a small territory.  While admitted, he developed A-fib and converted spontaneously.  His presentation was felt to be triggered by heat stroke.  He was last seen in the office in 06/2022 with worsening exertional dyspnea and occasional wheezing as well as some episodes of left-sided chest discomfort.  Subsequent Lexiscan MPI on 06/23/2022 was without evidence of ischemia with prior evidence of infarct as outlined below.  LVEF was moderately reduced with a calculated EF of 38%.  Overall, this study showed findings were consistent with prior MI and was intermediate risk.  Subsequent echo on 07/20/2022 demonstrated an EF of 35 to 40%, global hypokinesis, mild LVH, grade 1 diastolic dysfunction,  low normal RV systolic function, trivial mitral regurgitation, aortic valve sclerosis without evidence of stenosis, trivial aortic insufficiency, mildly dilated aortic root measuring 43 mm, mildly dilated ascending aorta measuring 42 mm, and an estimated right atrial pressure of 3 mmHg.  He comes in today accompanied by his wife and has noted an approximate 65-monthhistory of exertional dyspnea and fatigue without frank chest pain.  No significant lower extremity swelling.  Stable two-pillow orthopnea.  In the setting of his symptoms, he underwent the above work-up and presents today for further discussion.  He continues to note exertional shortness of breath and fatigue with activity such as walking to and from his mailbox earlier this morning.  He is without frank chest pain.  No presyncope or syncope.  No falls or symptoms concerning for bleeding.   Labs independently reviewed: 03/2022 - TC 142, TG 105, HDL 49, LDL 74, Hgb 14.3, PLT 169, BUN 25, serum creatinine 1.31, potassium 4.6, albumin 4.2, AST/ALT normal, TSH normal 12/2020 - A1c 5.7  Past Medical History:  Diagnosis Date   AAA (abdominal aortic aneurysm) ([place]    a.) s/p EVAR in 08/2007 by Dr. HHulda Humphrey b.) CTA on 06/30/2021 --> interval aneurysmal dilitation superior to stent graft with the juxtarenal aorta measuring 3.8 cm.   Anemia    Arthritis    both knees   Chronic anticoagulation    Apixaban   CKD (chronic kidney disease), stage II    Coronary artery disease    a.) [place] [facility] (LIMA-LAD, VG-RPDA, VG-OM3,VG-D2);  b.) [place] 2011: patent grafts EF 40%, c.) LHC 06/05/18: LM nl, pLAD 50%, p-mLAD 50%, ostD1 70%, ost ramus 30%, ost-pLCx 40%, pLCx 100% (chronically occluded), OM3 100%, p-dRCA 100% (chronically occluded), LIMA-LAD patent, VG-RPDA patent, VG-OM3 mid-graft 60% and 80%   Depression    GERD (gastroesophageal reflux disease)    Hernia of abdominal cavity    History of 2019 novel coronavirus disease (COVID-19) 09/2019    Hyperlipidemia    Hypertension    Hypothyroidism    Iliac artery aneurysm, right ([place])    a.) at the distal landing zone of the right iliac limb measuring 3 cm   Right middle lobe pulmonary nodule 06/30/2021   a.) measured 8 mm by CT on 06/30/2021.   S/P CABG x 4 06/25/2007   a.) LIMA-LAD, SVG-RPDA, SVG-OM3, SVG-D2   Sigmoid diverticulosis    Ventricular tachycardia ([place]) 05/2018   a.) found unresponsive by EMS; VT noted --> defibrillation achieved ROSC. Briefly intubated. Episode felt to be secondary to heat stoke.    Past Surgical History:  Procedure Laterality Date   ABDOMINAL AORTIC ANEURYSM REPAIR N/A 08/29/2007   Procedure: EVAR; Location: [place]; Surgeon: Collene Schlichter, MD   ABDOMINAL AORTIC ANEURYSM REPAIR N/A 12/05/2012   Procedure: Abdominal aortic aneurysm of approximately 6 cm in maximal diameter status post previous endovascular repair with type I and III endoleaks; Location: [place]; Surgeon: Leotis Pain, MD   CARDIAC CATHETERIZATION Left 06/28/2010   3v CAD with patent CABG grafts; LVEF 40%; stable aortic stent graft; Location: [place]; Surgeon: Serafina Royals, MD   CARDIAC CATHETERIZATION Left 06/18/2007   3v CAD; LVEF 50%; refer to CVTS for CABG; Location: [place]; Surgeon: [name], MD   COLONOSCOPY WITH PROPOFOL N/A 11/17/2021   Procedure: COLONOSCOPY WITH PROPOFOL;  Surgeon: Lin Landsman, MD;  Location: [facility] ENDOSCOPY;  Service: Gastroenterology;  Laterality: N/A;   CORONARY ARTERY BYPASS GRAFT N/A 06/25/2007   Procedure: 4v CABG (LIMA-LAD, SVG-RPDA, SVG-OM3, SVG-D2); Location: [facility]; Surgeon: Marnee Guarneri, MD   CORONARY/GRAFT ANGIOGRAPHY N/A 06/05/2018   Procedure: Remus Blake ANGIOGRAPHY;  Surgeon: Nelva Bush, MD;  Location: [place] CV LAB;  Service: Cardiovascular;  Laterality: N/A;   ENDOVASCULAR REPAIR/STENT GRAFT N/A 08/04/2021   Procedure: ENDOVASCULAR REPAIR/STENT GRAFT;  Surgeon: Algernon Huxley, MD;  Location: [place] CV LAB;  Service:  Cardiovascular;  Laterality: N/A;   EXPLORATORY LAPAROTOMY  1997   HERNIA REPAIR     KNEE ARTHROPLASTY Left 09/19/2016   Procedure: COMPUTER ASSISTED TOTAL KNEE ARTHROPLASTY;  Surgeon: Dereck Leep, MD;  Location: [facility] ORS;  Service: Orthopedics;  Laterality: Left;   TOTAL KNEE ARTHROPLASTY Right 09/2016    Current Medications: Current Meds  Medication Sig   apixaban (ELIQUIS) 5 MG TABS tablet Take 1 tablet (5 mg total) by mouth 2 (two) times daily.   buPROPion (WELLBUTRIN SR) 200 MG 12 hr tablet Take 1 tablet (200 mg total) by mouth 2 (two) times daily.   carvedilol (COREG) 3.125 MG tablet Take 1 tablet (3.125 mg total) by mouth 2 (two) times daily.   Cholecalciferol 50 MCG (2000 UT) CAPS Take 1 capsule by mouth daily.    citalopram (CELEXA) 40 MG tablet Take 1 tablet (40 mg total) by mouth daily.   ezetimibe (ZETIA) 10 MG tablet Take 1 tablet (10 mg total) by mouth daily.   levothyroxine (SYNTHROID) 75 MCG tablet Take 1 tablet (75 mcg total) by mouth daily.   melatonin 3 MG TABS tablet Take 1 tablet (3 mg total) by mouth at bedtime.  omeprazole (PRILOSEC) 20 MG capsule Take 1 capsule (20 mg total) by mouth daily.   rosuvastatin (CRESTOR) 40 MG tablet Take 1 tablet (40 mg total) by mouth daily.   [DISCONTINUED] metoprolol tartrate (LOPRESSOR) 25 MG tablet Take 0.5 tablets (12.5 mg total) by mouth 2 (two) times daily.    Allergies:   Benzodiazepines, Codeine, and Tetracycline   Social History   Socioeconomic History   Marital status: Married    Spouse name: Not on file   Number of children: Not on file   Years of education: 14   Highest education level: Some college, no degree  Occupational History   Occupation: retired  Tobacco Use   Smoking status: Former    Packs/day: 1.00    Years: 35.00    Total pack years: 35.00    Types: Cigarettes    Quit date: 12/16/2005    Years since quitting: 16.6   Smokeless tobacco: Current    Types: Chew   Tobacco comments:    pt states  he very rarely uses smokeless tobacco   Vaping Use   Vaping Use: Never used  Substance and Sexual Activity   Alcohol use: No   Drug use: No   Sexual activity: Yes    Partners: Female  Other Topics Concern   Not on file  Social History Narrative   Not on file   Social Determinants of Health   Financial Resource Strain: Low Risk  (08/06/2021)   Overall Financial Resource Strain (CARDIA)    Difficulty of Paying Living Expenses: Not hard at all  Food Insecurity: No Food Insecurity (08/06/2021)   Hunger Vital Sign    Worried About Running Out of Food in the Last Year: Never true    [place] in the Last Year: Never true  Transportation Needs: No Transportation Needs (08/06/2021)   PRAPARE - Hydrologist (Medical): No    Lack of Transportation (Non-Medical): No  Physical Activity: Inactive (08/06/2021)   Exercise Vital Sign    Days of Exercise per Week: 0 days    Minutes of Exercise per Session: 0 min  Stress: No Stress Concern Present (08/06/2021)   [place]    Feeling of Stress : Not at all  Social Connections: Moderately Integrated (07/12/2018)   Social Connection and Isolation Panel [NHANES]    Frequency of Communication with Friends and Family: More than three times a week    Frequency of Social Gatherings with Friends and Family: More than three times a week    Attends Religious Services: More than 4 times per year    Active Member of [employer] or Organizations: No    Attends Music therapist: Never    Marital Status: Married     Family History:  The patient's family history includes Depression in his maternal grandfather and maternal grandmother; Heart attack in his father, mother, and another family member; Heart disease in his mother and another family member; Hypertension in his father and mother; Kidney disease in his brother. There is no history of Prostate  cancer, Kidney cancer, Stomach cancer, or Colon cancer.  ROS:   12-point review of systems is negative unless otherwise noted in the HPI.   EKGs/Labs/Other Studies Reviewed:    Studies reviewed were summarized above. The additional studies were reviewed today:  2D echo 07/20/2022: 1. Left ventricular ejection fraction, by estimation, is 35 to 40%. The  left ventricle has moderately decreased  function. The left ventricle  demonstrates global hypokinesis. There is mild left ventricular  hypertrophy. Left ventricular diastolic  parameters are consistent with Grade I diastolic dysfunction (impaired  relaxation).   2. Right ventricular systolic function is low normal. The right  ventricular size is not well visualized.   3. The mitral valve is normal in structure. Trivial mitral valve  regurgitation.   4. The aortic valve is tricuspid. Aortic valve regurgitation is trivial.  Aortic valve sclerosis is present, with no evidence of aortic valve  stenosis.   5. Aortic dilatation noted. There is mild dilatation of the aortic root,  measuring 43 mm. There is mild dilatation of the ascending aorta,  measuring 42 mm.   6. The inferior vena cava is normal in size with greater than 50%  respiratory variability, suggesting right atrial pressure of 3 mmHg. __________  Carlton Adam MPI 06/23/2022:   Findings are consistent with prior myocardial infarction. The study is intermediate risk.   No ST deviation was noted.   LV perfusion is abnormal. There is no evidence of ischemia. There is evidence of infarction. Defect 1: There is a medium defect with moderate reduction in uptake present in the apical to mid inferolateral location(s) that is fixed. There is abnormal wall motion in the defect area. Consistent with infarction. Defect 2: There is a medium defect with moderate reduction in uptake present in the basal inferior location(s) that is fixed. There is abnormal wall motion in the defect area. Consistent  with infarction.   Left ventricular function is abnormal. Global function is moderately reduced.  Calculated ejection fraction is 38%. __________  LHC 05/2018: Conclusions: Severe native coronary artery disease with subtotal/total occlusions of the proximal/mid LAD, proximal LCx, and proximal RCA. Widely patent LIMA to LAD, SVG to diagonal, and SVG to RPDA. Patent but diffusely diseased SVG to OM3.  There are sequential focal 60% and 80% stenoses in the middle of the graft.  Intervention was not attempted due to the patient's inability to remain still on the table and consistently follow commands, as well as recent acute kidney injury in the setting of rhabdomyolysis.  Additionally, this graft supplies a relatively small territory.   Recommendations: Continue medical therapy and secondary prevention.  If platelets stabilize, recommend adding clopidogrel 75 mg daily to be continued at least 12 months. If patient has signs/symptoms of ischemia or recurrent ventricular tachycardia, consider PCI to SVG to OM3 once mental status improves.   Recommend uninterrupted dual antiplatelet therapy with Aspirin '81mg'$  daily and Clopidogrel '75mg'$  daily for a minimum of 12 months (ACS - Class I recommendation).  Clopidogrel will be added once platelet count has stabilized. __________   2D echo 05/2018: - Left ventricle: The cavity size was mildly dilated. Systolic   function was normal. The estimated ejection fraction was in the   range of 50% to 55%. Challenging images though grossly normal   Wall motion; there were grossly no significant regional wall   motion abnormalities. Left ventricular diastolic function   parameters were normal. - Ascending aorta: The ascending aorta was moderately dilated. 4.4   cm - Left atrium: The appendage was mildly to moderately dilated. - Right ventricle: Systolic function was normal. - Pulmonary arteries: PA peak pressure: 44 mm Hg (S).  EKG:  EKG is ordered today.  The  EKG ordered today demonstrates NSR, 72 bpm, left anterior fascicular block, anterolateral T wave inversion, consistent with prior tracing  Recent Labs: 03/22/2022: ALT 18; TSH 3.960 07/25/2022: BUN 29; Creatinine,  Ser 1.15; Hemoglobin 15.0; Platelets 154; Potassium 5.0; Sodium 140  Recent Lipid Panel    Component Value Date/Time   CHOL 142 03/29/2022 0913   CHOL 159 12/18/2015 0827   TRIG 105 03/29/2022 0913   TRIG 166 (H) 12/18/2015 0827   HDL 49 03/29/2022 0913   CHOLHDL 2.9 03/29/2022 0913   CHOLHDL 3.4 09/25/2018 1208   VLDL 31 09/25/2018 1208   VLDL 33 (H) 12/18/2015 0827   LDLCALC 74 03/29/2022 0913    PHYSICAL EXAM:    VS:  BP 124/84 (BP Location: Left Arm, Patient Position: Sitting, Cuff Size: Large)   Pulse 72   Ht '5\' 11"'$  (1.803 m)   Wt 221 lb (100.2 kg)   SpO2 98%   BMI 30.82 kg/m   BMI: Body mass index is 30.82 kg/m.  Physical Exam Vitals reviewed.  Constitutional:      Appearance: He is well-developed.  HENT:     Head: Normocephalic and atraumatic.  Eyes:     General:        Right eye: No discharge.        Left eye: No discharge.  Neck:     Vascular: No JVD.  Cardiovascular:     Rate and Rhythm: Normal rate and regular rhythm.     Heart sounds: Normal heart sounds, S1 normal and S2 normal. Heart sounds not distant. No midsystolic click and no opening snap. No murmur heard.    No friction rub.  Pulmonary:     Effort: Pulmonary effort is normal. No respiratory distress.     Breath sounds: Normal breath sounds. No decreased breath sounds, wheezing or rales.  Chest:     Chest wall: No tenderness.  Abdominal:     General: There is no distension.  Musculoskeletal:     Cervical back: Normal range of motion.     Right lower leg: No edema.     Left lower leg: No edema.     Comments: Varicose veins noted on the bilateral lower extremities.  Skin:    General: Skin is warm and dry.     Nails: There is no clubbing.  Neurological:     Mental Status: He is  alert and oriented to person, place, and time.  Psychiatric:        Speech: Speech normal.        Behavior: Behavior normal.        Thought Content: Thought content normal.        Judgment: Judgment normal.     Wt Readings from Last 3 Encounters:  07/25/22 221 lb (100.2 kg)  06/17/22 218 lb 12.8 oz (99.2 kg)  03/29/22 221 lb (100.2 kg)     ASSESSMENT & PLAN:   CAD status post CABG with exertional dyspnea concerning for anginal equivalent and new cardiomyopathy: Chest pain-free.  He continues to note exertional dyspnea.  Given symptoms, abnormal EKG, abnormal Lexiscan MPI, and new cardiomyopathy, we will pursue R/LHC.  He will discontinue apixaban 2 days prior to cardiac cath and take aspirin 81 mg daily during that timeframe.  Continue aggressive risk factor modification and secondary prevention including rosuvastatin and ezetimibe.  HFrEF: He appears euvolemic and well compensated.  In an effort to begin optimization of GDMT, we will discontinue metoprolol tartrate and transition him to carvedilol 3.125 mg twice daily.  Escalate evidence-based medical therapy as indicated/tolerated post cath.  Not currently requiring a standing diuretic.  PAF: Maintaining sinus rhythm.  Transition metoprolol to carvedilol as outlined above.  CHA2DS2-VASc at least 4 (CHF, HTN, age x1, vascular disease).  He remains on apixaban 5 mg twice daily without symptoms concerning for bleeding with recent normal hemoglobin and stable renal function/potassium.  He does not meet reduced dosing criteria.  HTN: Blood pressure is well controlled in the office today.  Transition metoprolol to carvedilol.  HLD: LDL 74 from 03/2022.  He remains on rosuvastatin 40 mg and ezetimibe.  Abdominal aortic aneurysm/iliac artery aneurysm: Followed by vascular surgery.  Dilated aortic root/ascending aorta: Stable to slightly improved on most recent echo when compared to study from 2019.   Shared Decision Making/Informed  Consent{  The risks [stroke (1 in 1000), death (1 in 1000), kidney failure [usually temporary] (1 in 500), bleeding (1 in 200), allergic reaction [possibly serious] (1 in 200)], benefits (diagnostic support and management of coronary artery disease) and alternatives of a cardiac catheterization were discussed in detail with Mr. Pharo and he is willing to proceed.     Disposition: F/u with Dr. Fletcher Anon or an APP 1 to 2 weeks post cath.   Medication Adjustments/Labs and Tests Ordered: Current medicines are reviewed at length with the patient today.  Concerns regarding medicines are outlined above. Medication changes, Labs and Tests ordered today are summarized above and listed in the Patient Instructions accessible in Encounters.   Signed, Christell Faith, PA-C 07/25/2022 4:05 PM     [place], [place] [address] [phone]

## 2022-07-25 NOTE — H&P (View-Only) (Signed)
Cardiology Office Note    Date:  07/25/2022   ID:  Stuart Harvey, DOB [birth year], MRN [id number]  PCP:  [facility]  Cardiologist:  [name], MD  Electrophysiologist:  None   Chief Complaint: Follow-up  History of Present Illness:   Stuart Harvey is a 70 y.o. male with history of CAD s/p 4-vessel CABG in 06/2007 at [facility], HFrEF, large infrarenal AAA s/p open surgical repair in 08/2007, right iliac artery aneurysm status post endovascular repair in 07/2021, PAF on Eliquis, CKD stage II, HTN, HLD, hypothyroidism, anemia, depression, and GERD who presents for follow-up of Lexiscan MPI and echo.  He was hospitalized in 05/2018 with unresponsiveness.  He was reportedly down for 1 hour with possible seizure-like activities.  He had numerous contusions.  Per EMS, he was in ventricular tachycardia upon arrival and was cardioverted.  He was briefly intubated and found to have mildly elevated troponin.  CPK was close to 4000.  Echo showed an EF of 50 to 55%.  LHC showed severe underlying three-vessel CAD with patent grafts including LIMA to LAD, SVG to RCA, SVG to diagonal, and SVG to OM3.  There was borderline disease in the SVG to OM, though that was left to be treated medically given it was supplying a small territory.  While admitted, he developed A-fib and converted spontaneously.  His presentation was felt to be triggered by heat stroke.  He was last seen in the office in 06/2022 with worsening exertional dyspnea and occasional wheezing as well as some episodes of left-sided chest discomfort.  Subsequent Lexiscan MPI on 06/23/2022 was without evidence of ischemia with prior evidence of infarct as outlined below.  LVEF was moderately reduced with a calculated EF of 38%.  Overall, this study showed findings were consistent with prior MI and was intermediate risk.  Subsequent echo on 07/20/2022 demonstrated an EF of 35 to 40%, global hypokinesis, mild LVH, grade 1 diastolic dysfunction,  low normal RV systolic function, trivial mitral regurgitation, aortic valve sclerosis without evidence of stenosis, trivial aortic insufficiency, mildly dilated aortic root measuring 43 mm, mildly dilated ascending aorta measuring 42 mm, and an estimated right atrial pressure of 3 mmHg.  He comes in today accompanied by his wife and has noted an approximate 43-monthhistory of exertional dyspnea and fatigue without frank chest pain.  No significant lower extremity swelling.  Stable two-pillow orthopnea.  In the setting of his symptoms, he underwent the above work-up and presents today for further discussion.  He continues to note exertional shortness of breath and fatigue with activity such as walking to and from his mailbox earlier this morning.  He is without frank chest pain.  No presyncope or syncope.  No falls or symptoms concerning for bleeding.   Labs independently reviewed: 03/2022 - TC 142, TG 105, HDL 49, LDL 74, Hgb 14.3, PLT 169, BUN 25, serum creatinine 1.31, potassium 4.6, albumin 4.2, AST/ALT normal, TSH normal 12/2020 - A1c 5.7  Past Medical History:  Diagnosis Date   AAA (abdominal aortic aneurysm) [place]    a.) s/p EVAR in 08/2007 by Dr. HHulda Humphrey b.) CTA on 06/30/2021 --> interval aneurysmal dilitation superior to stent graft with the juxtarenal aorta measuring 3.8 cm.   Anemia    Arthritis    both knees   Chronic anticoagulation    Apixaban   CKD (chronic kidney disease), stage II    Coronary artery disease    a.) [place] (LIMA-LAD, VG-RPDA, VG-OM3,VG-D2);  b.) [place] 2011: patent grafts EF 40%, c.) LHC 06/05/18: LM nl, pLAD 50%, p-mLAD 50%, ostD1 70%, ost ramus 30%, ost-pLCx 40%, pLCx 100% (chronically occluded), OM3 100%, p-dRCA 100% (chronically occluded), LIMA-LAD patent, VG-RPDA patent, VG-OM3 mid-graft 60% and 80%   Depression    GERD (gastroesophageal reflux disease)    Hernia of abdominal cavity    History of 2019 novel coronavirus disease (COVID-19) 09/2019    Hyperlipidemia    Hypertension    Hypothyroidism    Iliac artery aneurysm, right ([place])    a.) at the distal landing zone of the right iliac limb measuring 3 cm   Right middle lobe pulmonary nodule 06/30/2021   a.) measured 8 mm by CT on 06/30/2021.   S/P CABG x 4 06/25/2007   a.) LIMA-LAD, SVG-RPDA, SVG-OM3, SVG-D2   Sigmoid diverticulosis    Ventricular tachycardia ([place]) 05/2018   a.) found unresponsive by EMS; VT noted --> defibrillation achieved ROSC. Briefly intubated. Episode felt to be secondary to heat stoke.    Past Surgical History:  Procedure Laterality Date   ABDOMINAL AORTIC ANEURYSM REPAIR N/A 08/29/2007   Procedure: EVAR; Location: [place]; Surgeon: Collene Schlichter, MD   ABDOMINAL AORTIC ANEURYSM REPAIR N/A 12/05/2012   Procedure: Abdominal aortic aneurysm of approximately 6 cm in maximal diameter status post previous endovascular repair with type I and III endoleaks; Location: [place]; Surgeon: Leotis Pain, MD   CARDIAC CATHETERIZATION Left 06/28/2010   3v CAD with patent CABG grafts; LVEF 40%; stable aortic stent graft; Location: [place]; Surgeon: Serafina Royals, MD   CARDIAC CATHETERIZATION Left 06/18/2007   3v CAD; LVEF 50%; refer to CVTS for CABG; Location: [place]; Surgeon: [name], MD   COLONOSCOPY WITH PROPOFOL N/A 11/17/2021   Procedure: COLONOSCOPY WITH PROPOFOL;  Surgeon: Lin Landsman, MD;  Location: [facility] ENDOSCOPY;  Service: Gastroenterology;  Laterality: N/A;   CORONARY ARTERY BYPASS GRAFT N/A 06/25/2007   Procedure: 4v CABG (LIMA-LAD, SVG-RPDA, SVG-OM3, SVG-D2); Location: [facility]; Surgeon: Marnee Guarneri, MD   CORONARY/GRAFT ANGIOGRAPHY N/A 06/05/2018   Procedure: Remus Blake ANGIOGRAPHY;  Surgeon: Nelva Bush, MD;  Location: [place] CV LAB;  Service: Cardiovascular;  Laterality: N/A;   ENDOVASCULAR REPAIR/STENT GRAFT N/A 08/04/2021   Procedure: ENDOVASCULAR REPAIR/STENT GRAFT;  Surgeon: Algernon Huxley, MD;  Location: [place] CV LAB;  Service:  Cardiovascular;  Laterality: N/A;   EXPLORATORY LAPAROTOMY  1997   HERNIA REPAIR     KNEE ARTHROPLASTY Left 09/19/2016   Procedure: COMPUTER ASSISTED TOTAL KNEE ARTHROPLASTY;  Surgeon: Dereck Leep, MD;  Location: [facility] ORS;  Service: Orthopedics;  Laterality: Left;   TOTAL KNEE ARTHROPLASTY Right 09/2016    Current Medications: Current Meds  Medication Sig   apixaban (ELIQUIS) 5 MG TABS tablet Take 1 tablet (5 mg total) by mouth 2 (two) times daily.   buPROPion (WELLBUTRIN SR) 200 MG 12 hr tablet Take 1 tablet (200 mg total) by mouth 2 (two) times daily.   carvedilol (COREG) 3.125 MG tablet Take 1 tablet (3.125 mg total) by mouth 2 (two) times daily.   Cholecalciferol 50 MCG (2000 UT) CAPS Take 1 capsule by mouth daily.    citalopram (CELEXA) 40 MG tablet Take 1 tablet (40 mg total) by mouth daily.   ezetimibe (ZETIA) 10 MG tablet Take 1 tablet (10 mg total) by mouth daily.   levothyroxine (SYNTHROID) 75 MCG tablet Take 1 tablet (75 mcg total) by mouth daily.   melatonin 3 MG TABS tablet Take 1 tablet (3 mg total) by mouth at bedtime.  omeprazole (PRILOSEC) 20 MG capsule Take 1 capsule (20 mg total) by mouth daily.   rosuvastatin (CRESTOR) 40 MG tablet Take 1 tablet (40 mg total) by mouth daily.   [DISCONTINUED] metoprolol tartrate (LOPRESSOR) 25 MG tablet Take 0.5 tablets (12.5 mg total) by mouth 2 (two) times daily.    Allergies:   Benzodiazepines, Codeine, and Tetracycline   Social History   Socioeconomic History   Marital status: Married    Spouse name: Not on file   Number of children: Not on file   Years of education: 14   Highest education level: Some college, no degree  Occupational History   Occupation: retired  Tobacco Use   Smoking status: Former    Packs/day: 1.00    Years: 35.00    Total pack years: 35.00    Types: Cigarettes    Quit date: 12/16/2005    Years since quitting: 16.6   Smokeless tobacco: Current    Types: Chew   Tobacco comments:    pt states  he very rarely uses smokeless tobacco   Vaping Use   Vaping Use: Never used  Substance and Sexual Activity   Alcohol use: No   Drug use: No   Sexual activity: Yes    Partners: Female  Other Topics Concern   Not on file  Social History Narrative   Not on file   Social Determinants of Health   Financial Resource Strain: Low Risk  (08/06/2021)   Overall Financial Resource Strain (CARDIA)    Difficulty of Paying Living Expenses: Not hard at all  Food Insecurity: No Food Insecurity (08/06/2021)   Hunger Vital Sign    Worried About Running Out of Food in the Last Year: Never true    [place] in the Last Year: Never true  Transportation Needs: No Transportation Needs (08/06/2021)   PRAPARE - Hydrologist (Medical): No    Lack of Transportation (Non-Medical): No  Physical Activity: Inactive (08/06/2021)   Exercise Vital Sign    Days of Exercise per Week: 0 days    Minutes of Exercise per Session: 0 min  Stress: No Stress Concern Present (08/06/2021)   [place]    Feeling of Stress : Not at all  Social Connections: Moderately Integrated (07/12/2018)   Social Connection and Isolation Panel [NHANES]    Frequency of Communication with Friends and Family: More than three times a week    Frequency of Social Gatherings with Friends and Family: More than three times a week    Attends Religious Services: More than 4 times per year    Active Member of [employer] or Organizations: No    Attends Music therapist: Never    Marital Status: Married     Family History:  The patient's family history includes Depression in his maternal grandfather and maternal grandmother; Heart attack in his father, mother, and another family member; Heart disease in his mother and another family member; Hypertension in his father and mother; Kidney disease in his brother. There is no history of Prostate  cancer, Kidney cancer, Stomach cancer, or Colon cancer.  ROS:   12-point review of systems is negative unless otherwise noted in the HPI.   EKGs/Labs/Other Studies Reviewed:    Studies reviewed were summarized above. The additional studies were reviewed today:  2D echo 07/20/2022: 1. Left ventricular ejection fraction, by estimation, is 35 to 40%. The  left ventricle has moderately decreased  function. The left ventricle  demonstrates global hypokinesis. There is mild left ventricular  hypertrophy. Left ventricular diastolic  parameters are consistent with Grade I diastolic dysfunction (impaired  relaxation).   2. Right ventricular systolic function is low normal. The right  ventricular size is not well visualized.   3. The mitral valve is normal in structure. Trivial mitral valve  regurgitation.   4. The aortic valve is tricuspid. Aortic valve regurgitation is trivial.  Aortic valve sclerosis is present, with no evidence of aortic valve  stenosis.   5. Aortic dilatation noted. There is mild dilatation of the aortic root,  measuring 43 mm. There is mild dilatation of the ascending aorta,  measuring 42 mm.   6. The inferior vena cava is normal in size with greater than 50%  respiratory variability, suggesting right atrial pressure of 3 mmHg. __________  Carlton Adam MPI 06/23/2022:   Findings are consistent with prior myocardial infarction. The study is intermediate risk.   No ST deviation was noted.   LV perfusion is abnormal. There is no evidence of ischemia. There is evidence of infarction. Defect 1: There is a medium defect with moderate reduction in uptake present in the apical to mid inferolateral location(s) that is fixed. There is abnormal wall motion in the defect area. Consistent with infarction. Defect 2: There is a medium defect with moderate reduction in uptake present in the basal inferior location(s) that is fixed. There is abnormal wall motion in the defect area. Consistent  with infarction.   Left ventricular function is abnormal. Global function is moderately reduced.  Calculated ejection fraction is 38%. __________  LHC 05/2018: Conclusions: Severe native coronary artery disease with subtotal/total occlusions of the proximal/mid LAD, proximal LCx, and proximal RCA. Widely patent LIMA to LAD, SVG to diagonal, and SVG to RPDA. Patent but diffusely diseased SVG to OM3.  There are sequential focal 60% and 80% stenoses in the middle of the graft.  Intervention was not attempted due to the patient's inability to remain still on the table and consistently follow commands, as well as recent acute kidney injury in the setting of rhabdomyolysis.  Additionally, this graft supplies a relatively small territory.   Recommendations: Continue medical therapy and secondary prevention.  If platelets stabilize, recommend adding clopidogrel 75 mg daily to be continued at least 12 months. If patient has signs/symptoms of ischemia or recurrent ventricular tachycardia, consider PCI to SVG to OM3 once mental status improves.   Recommend uninterrupted dual antiplatelet therapy with Aspirin '81mg'$  daily and Clopidogrel '75mg'$  daily for a minimum of 12 months (ACS - Class I recommendation).  Clopidogrel will be added once platelet count has stabilized. __________   2D echo 05/2018: - Left ventricle: The cavity size was mildly dilated. Systolic   function was normal. The estimated ejection fraction was in the   range of 50% to 55%. Challenging images though grossly normal   Wall motion; there were grossly no significant regional wall   motion abnormalities. Left ventricular diastolic function   parameters were normal. - Ascending aorta: The ascending aorta was moderately dilated. 4.4   cm - Left atrium: The appendage was mildly to moderately dilated. - Right ventricle: Systolic function was normal. - Pulmonary arteries: PA peak pressure: 44 mm Hg (S).  EKG:  EKG is ordered today.  The  EKG ordered today demonstrates NSR, 72 bpm, left anterior fascicular block, anterolateral T wave inversion, consistent with prior tracing  Recent Labs: 03/22/2022: ALT 18; TSH 3.960 07/25/2022: BUN 29; Creatinine,  Ser 1.15; Hemoglobin 15.0; Platelets 154; Potassium 5.0; Sodium 140  Recent Lipid Panel    Component Value Date/Time   CHOL 142 03/29/2022 0913   CHOL 159 12/18/2015 0827   TRIG 105 03/29/2022 0913   TRIG 166 (H) 12/18/2015 0827   HDL 49 03/29/2022 0913   CHOLHDL 2.9 03/29/2022 0913   CHOLHDL 3.4 09/25/2018 1208   VLDL 31 09/25/2018 1208   VLDL 33 (H) 12/18/2015 0827   LDLCALC 74 03/29/2022 0913    PHYSICAL EXAM:    VS:  BP 124/84 (BP Location: Left Arm, Patient Position: Sitting, Cuff Size: Large)   Pulse 72   Ht '5\' 11"'$  (1.803 m)   Wt 221 lb (100.2 kg)   SpO2 98%   BMI 30.82 kg/m   BMI: Body mass index is 30.82 kg/m.  Physical Exam Vitals reviewed.  Constitutional:      Appearance: He is well-developed.  HENT:     Head: Normocephalic and atraumatic.  Eyes:     General:        Right eye: No discharge.        Left eye: No discharge.  Neck:     Vascular: No JVD.  Cardiovascular:     Rate and Rhythm: Normal rate and regular rhythm.     Heart sounds: Normal heart sounds, S1 normal and S2 normal. Heart sounds not distant. No midsystolic click and no opening snap. No murmur heard.    No friction rub.  Pulmonary:     Effort: Pulmonary effort is normal. No respiratory distress.     Breath sounds: Normal breath sounds. No decreased breath sounds, wheezing or rales.  Chest:     Chest wall: No tenderness.  Abdominal:     General: There is no distension.  Musculoskeletal:     Cervical back: Normal range of motion.     Right lower leg: No edema.     Left lower leg: No edema.     Comments: Varicose veins noted on the bilateral lower extremities.  Skin:    General: Skin is warm and dry.     Nails: There is no clubbing.  Neurological:     Mental Status: He is  alert and oriented to person, place, and time.  Psychiatric:        Speech: Speech normal.        Behavior: Behavior normal.        Thought Content: Thought content normal.        Judgment: Judgment normal.     Wt Readings from Last 3 Encounters:  07/25/22 221 lb (100.2 kg)  06/17/22 218 lb 12.8 oz (99.2 kg)  03/29/22 221 lb (100.2 kg)     ASSESSMENT & PLAN:   CAD status post CABG with exertional dyspnea concerning for anginal equivalent and new cardiomyopathy: Chest pain-free.  He continues to note exertional dyspnea.  Given symptoms, abnormal EKG, abnormal Lexiscan MPI, and new cardiomyopathy, we will pursue R/LHC.  He will discontinue apixaban 2 days prior to cardiac cath and take aspirin 81 mg daily during that timeframe.  Continue aggressive risk factor modification and secondary prevention including rosuvastatin and ezetimibe.  HFrEF: He appears euvolemic and well compensated.  In an effort to begin optimization of GDMT, we will discontinue metoprolol tartrate and transition him to carvedilol 3.125 mg twice daily.  Escalate evidence-based medical therapy as indicated/tolerated post cath.  Not currently requiring a standing diuretic.  PAF: Maintaining sinus rhythm.  Transition metoprolol to carvedilol as outlined above.  CHA2DS2-VASc at least 4 (CHF, HTN, age x1, vascular disease).  He remains on apixaban 5 mg twice daily without symptoms concerning for bleeding with recent normal hemoglobin and stable renal function/potassium.  He does not meet reduced dosing criteria.  HTN: Blood pressure is well controlled in the office today.  Transition metoprolol to carvedilol.  HLD: LDL 74 from 03/2022.  He remains on rosuvastatin 40 mg and ezetimibe.  Abdominal aortic aneurysm/iliac artery aneurysm: Followed by vascular surgery.  Dilated aortic root/ascending aorta: Stable to slightly improved on most recent echo when compared to study from 2019.   Shared Decision Making/Informed  Consent{  The risks [stroke (1 in 1000), death (1 in 1000), kidney failure [usually temporary] (1 in 500), bleeding (1 in 200), allergic reaction [possibly serious] (1 in 200)], benefits (diagnostic support and management of coronary artery disease) and alternatives of a cardiac catheterization were discussed in detail with Mr. Kelsay and he is willing to proceed.     Disposition: F/u with Dr. Fletcher Anon or an APP 1 to 2 weeks post cath.   Medication Adjustments/Labs and Tests Ordered: Current medicines are reviewed at length with the patient today.  Concerns regarding medicines are outlined above. Medication changes, Labs and Tests ordered today are summarized above and listed in the Patient Instructions accessible in Encounters.   Signed, Christell Faith, PA-C 07/25/2022 4:05 PM     [employer] [address] [place],  [address] [phone]

## 2022-07-25 NOTE — Patient Instructions (Signed)
Medication Instructions:  Your physician has recommended you make the following change in your medication:   STOP Metoprolol START Carvedilol 3.125 mg twice a day HOLD Eliquis 2 days before procedure and take aspirin 81 mg on those 2 days.   *If you need a refill on your cardiac medications before your next appointment, please call your pharmacy*   Lab Work: [place] today over at the [place] entrance and check in at registration.   If you have labs (blood work) drawn today and your tests are completely normal, you will receive your results only by: [place] (if you have MyChart) OR A paper copy in the mail If you have any lab test that is abnormal or we need to change your treatment, we will call you to review the results.   Testing/Procedures: [facility] Cardiac Cath Instructions  You are scheduled for a Cardiac Cath on: Friday 08/05/22 with Dr. Fletcher Anon Please arrive at 06:30 am on the day of your procedure Please expect a call from our [place] to pre-register you Do not eat/drink anything after midnight Someone will need to drive you home It is recommended someone be with you for the first 24 hours after your procedure Wear clothes that are easy to get on/off and wear slip on shoes if possible   Medications bring a current list of all medications with you   _XX__ Do not take these medications before your procedure: HOLD Eliquis 2 days prior to procedure.   _XX__ You may take all of your other medications the morning of your procedure with enough water to swallow safely  Day of your procedure: Arrive at the [place] entrance.  Free valet service is available.  After entering the [place] please check-in at the registration desk (1st desk on your right) to receive your armband. After receiving your armband someone will escort you to the cardiac cath/special procedures waiting area.  The usual length of stay after your procedure is  about 2 to 3 hours.  This can vary.  If you have any questions, please call our office at [phone], or you may call the cardiac cath lab at [facility] directly at [phone]    Follow-Up: At [facility], you and your health needs are our priority.  As part of our continuing mission to provide you with exceptional heart care, we have created designated Provider Care Teams.  These Care Teams include your primary Cardiologist (physician) and Advanced Practice Providers (APPs -  Physician Assistants and Nurse Practitioners) who all work together to provide you with the care you need, when you need it.   Your next appointment:   2 week(s) after procedure.   The format for your next appointment:   In Person  Provider:   [name], MD or Christell Faith, PA-C       Important Information About Sugar

## 2022-07-27 ENCOUNTER — Telehealth: Payer: Self-pay | Admitting: Cardiovascular Disease

## 2022-07-27 NOTE — Telephone Encounter (Signed)
Pt c/o medication issue:  1. Name of Medication:   carvedilol (COREG) 3.125 MG tablet  2. How are you currently taking this medication (dosage and times per day)? As prescribed  3. Are you having a reaction (difficulty breathing--STAT)? No  4. What is your medication issue?   Patient stated he had been taking this medication but it is causing him stomach pain.

## 2022-07-27 NOTE — Telephone Encounter (Signed)
Spoke with patient and he reports on the second day of taking carvedilol he developed stomach problems. He has been having pain and goes to bathroom after eating. He quit taking it this morning because he was not able to keep anything on his stomach. Advised that I would forward this over to provider for his review and would give him a call back. He verbalized understanding with no further concerns at this time.

## 2022-07-28 MED ORDER — METOPROLOL SUCCINATE ER 25 MG PO TB24: 25.0000 mg | ORAL_TABLET | Freq: Every day | ORAL | 3 refills | Status: DC

## 2022-07-28 NOTE — Telephone Encounter (Signed)
Spoke with patient and reviewed recommendations from provider. He was agreeable with this plan and will pick up new prescription at his pharmacy. No further concerns or needs at this time.

## 2022-07-28 NOTE — Telephone Encounter (Signed)
He can discontinue carvedilol.  Start Toprol-XL 25 mg daily.  This is similar to the metoprolol he was previously on, though is sustained-release to given his cardiomyopathy.

## 2022-07-29 ENCOUNTER — Telehealth: Payer: Self-pay | Admitting: Cardiovascular Disease

## 2022-07-29 NOTE — Telephone Encounter (Signed)
   Pt is requesting to speak with RN Olin Hauser again. He said, he has some f/u question

## 2022-07-29 NOTE — Telephone Encounter (Signed)
Rise Mu, PA-C      07/28/22  6:31 AM Note He can discontinue carvedilol.  Start Toprol-XL 25 mg daily.  This is similar to the metoprolol he was previously on, though is sustained-release to given his cardiomyopathy.      Wife concerned patient is SOB.She states when he spoke with staff on 07/27/22 he did not mention SOB. Wife states he was able to eat eggs this and did not vomit. When I asked her if he had diarrhea she said he had black, sticky stool on Wednesday but none since.I advised her to call his pcp to see if they can be seen today and that I would notify the provider as his SOB could be related to change in his HGb/HCT or his ongoing GI issue.   Current VS 111/78, HR 75, Wt:217 lbs

## 2022-07-29 NOTE — Telephone Encounter (Signed)
Weight appears stable with a home weight of 217 pounds and an office weight of 221 pounds.  Is there any lower extremity swelling, abdominal incision, or orthopnea?  Recent hemoglobin normal, however with possible melena agree with evaluation with PCP, urgent care, or ED.  Dyspnea may also be in the context of his cardiomyopathy with concern for possible ischemic heart disease as noted by abnormal Myoview, for which she is scheduled for a R/LHC.  Difficult to further address over the phone.  If patient cannot be seen at his PCPs office today, would recommend ED evaluation to ensure he is stable.

## 2022-08-01 NOTE — Telephone Encounter (Signed)
Attempted to call the patient. No answer- Voice mail box is full.  Will attempt to reach the patient at a later time.

## 2022-08-02 ENCOUNTER — Telehealth: Payer: Self-pay | Admitting: Cardiovascular Disease

## 2022-08-02 NOTE — Telephone Encounter (Signed)
See phone note from today for this issue. Closing this encounter.

## 2022-08-02 NOTE — Telephone Encounter (Signed)
Patient returned RN's call. 

## 2022-08-02 NOTE — Telephone Encounter (Signed)
Called patient back and discussed Stuart Harvey's note from telephone encounter as copied below from 07/29/22. Patient states that he has not had any more tarry stool or stomach pain. He has had some diarrhea which he is taking some OTC medicine for today. He has been hydrating as well. Patient denies having any fever or other symptoms. He verbalized understanding to reach out to PCP or Urgent Care if he has anymore tarry stools or SOB.

## 2022-08-04 NOTE — Progress Notes (Signed)
Called patient and told him to arrive at 7am for the rescheduled start time of 8am per Dr. Fletcher Anon

## 2022-08-05 ENCOUNTER — Other Ambulatory Visit: Payer: Self-pay

## 2022-08-05 ENCOUNTER — Encounter
Admission: RE | Disposition: A | Discharge status: Home / Self Care | Payer: Self-pay | Source: Home / Self Care | Attending: Cardiovascular Disease

## 2022-08-05 ENCOUNTER — Ambulatory Visit
Admission: RE | Admit: 2022-08-05 | Discharge: 2022-08-05 | Disposition: A | Discharge status: Home / Self Care | Payer: Medicare HMO | Attending: Cardiovascular Disease | Admitting: Cardiovascular Disease

## 2022-08-05 ENCOUNTER — Encounter: Payer: Self-pay | Admitting: Cardiovascular Disease

## 2022-08-05 DIAGNOSIS — N182 Chronic kidney disease, stage 2 (mild): Secondary | ICD-10-CM | POA: Insufficient documentation

## 2022-08-05 DIAGNOSIS — I130 Hypertensive heart and chronic kidney disease with heart failure and stage 1 through stage 4 chronic kidney disease, or unspecified chronic kidney disease: Secondary | ICD-10-CM | POA: Diagnosis not present

## 2022-08-05 DIAGNOSIS — I480 Paroxysmal atrial fibrillation: Secondary | ICD-10-CM | POA: Insufficient documentation

## 2022-08-05 DIAGNOSIS — I25810 Atherosclerosis of coronary artery bypass graft(s) without angina pectoris: Secondary | ICD-10-CM | POA: Insufficient documentation

## 2022-08-05 DIAGNOSIS — E785 Hyperlipidemia, unspecified: Secondary | ICD-10-CM | POA: Diagnosis not present

## 2022-08-05 DIAGNOSIS — I2582 Chronic total occlusion of coronary artery: Secondary | ICD-10-CM | POA: Insufficient documentation

## 2022-08-05 DIAGNOSIS — I5020 Unspecified systolic (congestive) heart failure: Secondary | ICD-10-CM | POA: Insufficient documentation

## 2022-08-05 DIAGNOSIS — I2510 Atherosclerotic heart disease of native coronary artery without angina pectoris: Secondary | ICD-10-CM

## 2022-08-05 DIAGNOSIS — I5022 Chronic systolic (congestive) heart failure: Secondary | ICD-10-CM | POA: Insufficient documentation

## 2022-08-05 DIAGNOSIS — Z87891 Personal history of nicotine dependence: Secondary | ICD-10-CM | POA: Insufficient documentation

## 2022-08-05 DIAGNOSIS — F32A Depression, unspecified: Secondary | ICD-10-CM | POA: Diagnosis not present

## 2022-08-05 DIAGNOSIS — I25118 Atherosclerotic heart disease of native coronary artery with other forms of angina pectoris: Secondary | ICD-10-CM | POA: Diagnosis not present

## 2022-08-05 DIAGNOSIS — Z7901 Long term (current) use of anticoagulants: Secondary | ICD-10-CM | POA: Insufficient documentation

## 2022-08-05 DIAGNOSIS — K219 Gastro-esophageal reflux disease without esophagitis: Secondary | ICD-10-CM | POA: Insufficient documentation

## 2022-08-05 DIAGNOSIS — Z951 Presence of aortocoronary bypass graft: Secondary | ICD-10-CM

## 2022-08-05 DIAGNOSIS — R0609 Other forms of dyspnea: Secondary | ICD-10-CM | POA: Diagnosis not present

## 2022-08-05 DIAGNOSIS — E039 Hypothyroidism, unspecified: Secondary | ICD-10-CM | POA: Insufficient documentation

## 2022-08-05 DIAGNOSIS — Z79899 Other long term (current) drug therapy: Secondary | ICD-10-CM | POA: Insufficient documentation

## 2022-08-05 HISTORY — PX: RIGHT/LEFT HEART CATH AND CORONARY ANGIOGRAPHY: CATH118266

## 2022-08-05 SURGERY — RIGHT/LEFT HEART CATH AND CORONARY ANGIOGRAPHY
Anesthesia: Moderate Sedation

## 2022-08-05 MED ORDER — SODIUM CHLORIDE 0.9% FLUSH: 3.0000 mL | Freq: Two times a day (BID) | INTRAVENOUS | Status: DC

## 2022-08-05 MED ORDER — FENTANYL CITRATE (PF) 100 MCG/2ML IJ SOLN
INTRAMUSCULAR | Status: DC | PRN
  Administered 2022-08-05: 25 ug via INTRAVENOUS

## 2022-08-05 MED ORDER — ASPIRIN 81 MG PO CHEW
CHEWABLE_TABLET | ORAL | Status: AC
  Administered 2022-08-05: 81 mg via ORAL
  Filled 2022-08-05: qty 1

## 2022-08-05 MED ORDER — SODIUM CHLORIDE 0.9 % IV SOLN: 250.0000 mL | INTRAVENOUS | Status: DC | PRN

## 2022-08-05 MED ORDER — ASPIRIN 81 MG PO CHEW: 81.0000 mg | CHEWABLE_TABLET | ORAL | Status: AC

## 2022-08-05 MED ORDER — VERAPAMIL HCL 2.5 MG/ML IV SOLN
INTRAVENOUS | Status: DC | PRN
  Administered 2022-08-05: 2.5 mg via INTRA_ARTERIAL

## 2022-08-05 MED ORDER — MIDAZOLAM HCL 2 MG/2ML IJ SOLN
INTRAMUSCULAR | Status: DC | PRN
  Administered 2022-08-05: 1 mg via INTRAVENOUS

## 2022-08-05 MED ORDER — FENTANYL CITRATE (PF) 100 MCG/2ML IJ SOLN
INTRAMUSCULAR | Status: AC
  Filled 2022-08-05: qty 2

## 2022-08-05 MED ORDER — SODIUM CHLORIDE 0.9% FLUSH: 3.0000 mL | INTRAVENOUS | Status: DC | PRN

## 2022-08-05 MED ORDER — VERAPAMIL HCL 2.5 MG/ML IV SOLN
INTRAVENOUS | Status: AC
  Filled 2022-08-05: qty 2

## 2022-08-05 MED ORDER — SODIUM CHLORIDE 0.9 % IV SOLN: INTRAVENOUS | Status: DC

## 2022-08-05 MED ORDER — ONDANSETRON HCL 4 MG/2ML IJ SOLN: 4.0000 mg | Freq: Four times a day (QID) | INTRAMUSCULAR | Status: DC | PRN

## 2022-08-05 MED ORDER — HEPARIN SODIUM (PORCINE) 1000 UNIT/ML IJ SOLN
INTRAMUSCULAR | Status: DC | PRN
  Administered 2022-08-05: 5000 [IU] via INTRAVENOUS

## 2022-08-05 MED ORDER — HEPARIN (PORCINE) IN NACL 2000-0.9 UNIT/L-% IV SOLN
INTRAVENOUS | Status: DC | PRN
  Administered 2022-08-05: 1000 mL

## 2022-08-05 MED ORDER — HEPARIN (PORCINE) IN NACL 1000-0.9 UT/500ML-% IV SOLN
INTRAVENOUS | Status: AC
  Filled 2022-08-05: qty 1000

## 2022-08-05 MED ORDER — LIDOCAINE HCL 1 % IJ SOLN
INTRAMUSCULAR | Status: AC
  Filled 2022-08-05: qty 20

## 2022-08-05 MED ORDER — HEPARIN SODIUM (PORCINE) 1000 UNIT/ML IJ SOLN
INTRAMUSCULAR | Status: AC
  Filled 2022-08-05: qty 10

## 2022-08-05 MED ORDER — ACETAMINOPHEN 325 MG PO TABS: 650.0000 mg | ORAL_TABLET | ORAL | Status: DC | PRN

## 2022-08-05 MED ORDER — LIDOCAINE HCL (PF) 1 % IJ SOLN
INTRAMUSCULAR | Status: DC | PRN
  Administered 2022-08-05 (×2): 2 mL

## 2022-08-05 MED ORDER — IOHEXOL 300 MG/ML  SOLN
INTRAMUSCULAR | Status: DC | PRN
  Administered 2022-08-05: 80 mL

## 2022-08-05 MED ORDER — MIDAZOLAM HCL 2 MG/2ML IJ SOLN
INTRAMUSCULAR | Status: AC
  Filled 2022-08-05: qty 2

## 2022-08-05 SURGICAL SUPPLY — 16 items
BAND ZEPHYR COMPRESS 30 LONG (HEMOSTASIS) IMPLANT
CATH 5F 110X4 TIG (CATHETERS) IMPLANT
CATH BALLN WEDGE 5F 110CM (CATHETERS) IMPLANT
CATH INFINITI 5 FR MPA2 (CATHETERS) IMPLANT
CATH INFINITI 5FR JL4 (CATHETERS) IMPLANT
CATH INFINITI JR4 5F (CATHETERS) IMPLANT
DRAPE BRACHIAL (DRAPES) IMPLANT
GLIDESHEATH SLEND SS 6F .021 (SHEATH) IMPLANT
GUIDEWIRE .025 260CM (WIRE) IMPLANT
GUIDEWIRE INQWIRE 1.5J.035X260 (WIRE) IMPLANT
INQWIRE 1.5J .035X260CM (WIRE) ×1
PACK CARDIAC CATH (CUSTOM PROCEDURE TRAY) ×1 IMPLANT
PROTECTION STATION PRESSURIZED (MISCELLANEOUS) ×1
SET ATX SIMPLICITY (MISCELLANEOUS) IMPLANT
SHEATH GLIDE SLENDER 4/5FR (SHEATH) IMPLANT
STATION PROTECTION PRESSURIZED (MISCELLANEOUS) IMPLANT

## 2022-08-05 NOTE — Interval H&P Note (Signed)
Cath Lab Visit (complete for each Cath Lab visit)  Clinical Evaluation Leading to the Procedure:   ACS: No.  Non-ACS:    Anginal Classification: CCS III  Anti-ischemic medical therapy: Maximal Therapy (2 or more classes of medications)  Non-Invasive Test Results: Intermediate-risk stress test findings: cardiac mortality 1-3%/year  Prior CABG: Previous CABG      History and Physical Interval Note:  08/05/2022 8:17 AM  Stuart Harvey  has presented today for surgery, with the diagnosis of RT LT Heart Cath   CAD  HFrEF  Status post CABG.  The various methods of treatment have been discussed with the patient and family. After consideration of risks, benefits and other options for treatment, the patient has consented to  Procedure(s): RIGHT/LEFT HEART CATH AND CORONARY ANGIOGRAPHY (N/A) as a surgical intervention.  The patient's history has been reviewed, patient examined, no change in status, stable for surgery.  I have reviewed the patient's chart and labs.  Questions were answered to the patient's satisfaction.     [name]

## 2022-08-08 ENCOUNTER — Encounter: Payer: Self-pay | Admitting: Cardiovascular Disease

## 2022-08-08 ENCOUNTER — Ambulatory Visit: Payer: Medicare HMO

## 2022-08-09 ENCOUNTER — Other Ambulatory Visit: Payer: Self-pay | Admitting: Nurse Practitioner

## 2022-08-09 ENCOUNTER — Other Ambulatory Visit: Payer: Self-pay | Admitting: Cardiovascular Disease

## 2022-08-09 ENCOUNTER — Ambulatory Visit (INDEPENDENT_AMBULATORY_CARE_PROVIDER_SITE_OTHER): Payer: Medicare HMO

## 2022-08-09 VITALS — BP 115/75 | HR 75 | Temp 97.8°F | Ht 71.0 in | Wt 217.9 lb

## 2022-08-09 DIAGNOSIS — Z0000 Encounter for general adult medical examination without abnormal findings: Secondary | ICD-10-CM

## 2022-08-09 NOTE — Patient Instructions (Signed)
Health Maintenance, Male Adopting a healthy lifestyle and getting preventive care are important in promoting health and wellness. Ask your health care provider about: The right schedule for you to have regular tests and exams. Things you can do on your own to prevent diseases and keep yourself healthy. What should I know about diet, weight, and exercise? Eat a healthy diet  Eat a diet that includes plenty of vegetables, fruits, low-fat dairy products, and lean protein. Do not eat a lot of foods that are high in solid fats, added sugars, or sodium. Maintain a healthy weight Body mass index (BMI) is a measurement that can be used to identify possible weight problems. It estimates body fat based on height and weight. Your health care provider can help determine your BMI and help you achieve or maintain a healthy weight. Get regular exercise Get regular exercise. This is one of the most important things you can do for your health. Most adults should: Exercise for at least 150 minutes each week. The exercise should increase your heart rate and make you sweat (moderate-intensity exercise). Do strengthening exercises at least twice a week. This is in addition to the moderate-intensity exercise. Spend less time sitting. Even light physical activity can be beneficial. Watch cholesterol and blood lipids Have your blood tested for lipids and cholesterol at 71 years of age, then have this test every 5 years. You may need to have your cholesterol levels checked more often if: Your lipid or cholesterol levels are high. You are older than 71 years of age. You are at high risk for heart disease. What should I know about cancer screening? Many types of cancers can be detected early and may often be prevented. Depending on your health history and family history, you may need to have cancer screening at various ages. This may include screening for: Colorectal cancer. Prostate cancer. Skin cancer. Lung  cancer. What should I know about heart disease, diabetes, and high blood pressure? Blood pressure and heart disease High blood pressure causes heart disease and increases the risk of stroke. This is more likely to develop in people who have high blood pressure readings or are overweight. Talk with your health care provider about your target blood pressure readings. Have your blood pressure checked: Every 3-5 years if you are 18-39 years of age. Every year if you are 40 years old or older. If you are between the ages of 65 and 75 and are a current or former smoker, ask your health care provider if you should have a one-time screening for abdominal aortic aneurysm (AAA). Diabetes Have regular diabetes screenings. This checks your fasting blood sugar level. Have the screening done: Once every three years after age 45 if you are at a normal weight and have a low risk for diabetes. More often and at a younger age if you are overweight or have a high risk for diabetes. What should I know about preventing infection? Hepatitis B If you have a higher risk for hepatitis B, you should be screened for this virus. Talk with your health care provider to find out if you are at risk for hepatitis B infection. Hepatitis C Blood testing is recommended for: Everyone born from 1945 through 1965. Anyone with known risk factors for hepatitis C. Sexually transmitted infections (STIs) You should be screened each year for STIs, including gonorrhea and chlamydia, if: You are sexually active and are younger than 71 years of age. You are older than 71 years of age and your   health care provider tells you that you are at risk for this type of infection. Your sexual activity has changed since you were last screened, and you are at increased risk for chlamydia or gonorrhea. Ask your health care provider if you are at risk. Ask your health care provider about whether you are at high risk for HIV. Your health care provider  may recommend a prescription medicine to help prevent HIV infection. If you choose to take medicine to prevent HIV, you should first get tested for HIV. You should then be tested every 3 months for as long as you are taking the medicine. Follow these instructions at home: Alcohol use Do not drink alcohol if your health care provider tells you not to drink. If you drink alcohol: Limit how much you have to 0-2 drinks a day. Know how much alcohol is in your drink. In the [place] one drink equals one 12 oz bottle of beer (355 mL), one 5 oz glass of wine (148 mL), or one 1 oz glass of hard liquor (44 mL). Lifestyle Do not use any products that contain nicotine or tobacco. These products include cigarettes, chewing tobacco, and vaping devices, such as e-cigarettes. If you need help quitting, ask your health care provider. Do not use street drugs. Do not share needles. Ask your health care provider for help if you need support or information about quitting drugs. General instructions Schedule regular health, dental, and eye exams. Stay current with your vaccines. Tell your health care provider if: You often feel depressed. You have ever been abused or do not feel safe at home. Summary Adopting a healthy lifestyle and getting preventive care are important in promoting health and wellness. Follow your health care provider's instructions about healthy diet, exercising, and getting tested or screened for diseases. Follow your health care provider's instructions on monitoring your cholesterol and blood pressure. This information is not intended to replace advice given to you by your health care provider. Make sure you discuss any questions you have with your health care provider. Document Revised: 03/22/2021 Document Reviewed: 03/22/2021 Elsevier Patient Education  2023 [employer]  

## 2022-08-09 NOTE — Telephone Encounter (Signed)
Requested Prescriptions  Pending Prescriptions Disp Refills  . buPROPion (WELLBUTRIN SR) 200 MG 12 hr tablet [Pharmacy Med Name: BUPROPION HCL SR 200 MG TABLET] 180 tablet 0    Sig: Take 1 tablet (200 mg total) by mouth 2 (two) times daily.     Psychiatry: Antidepressants - bupropion Passed - 08/09/2022 11:19 AM      Passed - Cr in normal range and within 360 days    Creatinine  Date Value Ref Range Status  02/20/2015 1.37 (H) mg/dL Final    Comment:    0.61-1.24 NOTE: New Reference Range  01/20/15    Creatinine, Ser  Date Value Ref Range Status  07/25/2022 1.15 0.61 - 1.24 mg/dL Final         Passed - AST in normal range and within 360 days    AST  Date Value Ref Range Status  03/22/2022 28 0 - 40 IU/L Final   SGOT(AST)  Date Value Ref Range Status  12/13/2014 30 15 - 37 Unit/L Final         Passed - ALT in normal range and within 360 days    ALT  Date Value Ref Range Status  03/22/2022 18 0 - 44 IU/L Final   SGPT (ALT)  Date Value Ref Range Status  12/13/2014 20 14 - 63 U/L Final         Passed - Completed PHQ-2 or PHQ-9 in the last 360 days      Passed - Last BP in normal range    BP Readings from Last 1 Encounters:  08/09/22 115/75         Passed - Valid encounter within last 6 months    Recent Outpatient Visits          4 months ago Hyperlipidemia, unspecified hyperlipidemia type   [facility] Vigg, Avanti, MD   10 months ago Screen for colon cancer   [facility] Vigg, Avanti, MD   1 year ago Screening for colon cancer   [facility] Vigg, Avanti, MD   1 year ago 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 [place]   In 1 month Sninsky, Herbert Seta, MD [place]   In 2 months Kathrine Haddock, NP [facility], [place]

## 2022-08-09 NOTE — Progress Notes (Signed)
Subjective:   Stuart Harvey is a 71 y.o. male who presents for Medicare Annual/Subsequent preventive examination.  Review of Systems    Defer to PCP.     Objective:    Today's Vitals   08/09/22 1052 08/09/22 1057  BP: 115/75   Pulse: 75   Temp: 97.8 F (36.6 C)   TempSrc: Oral   SpO2: 98%   Weight: 217 lb 14.4 oz (98.8 kg)   Height: '5\' 11"'$  (1.803 m)   PainSc: 0-No pain 0-No pain   Body mass index is 30.39 kg/m.     08/05/2022    7:15 AM 11/17/2021    9:11 AM 08/06/2021   11:19 AM 08/04/2021    7:17 AM 07/22/2021   10:37 AM 08/03/2020   11:19 AM 03/13/2020   10:56 AM  Advanced Directives  Does Patient Have a Medical Advance Directive? Yes Yes Yes No Yes Yes Yes  Type of Paramedic of [place];Living will [place];Living will Living will  [place];Living will [place];Living will   Does patient want to make changes to medical advance directive?     No - Patient declined    Copy of [place] in Chart?  No - copy requested No - copy requested  No - copy requested No - copy requested     Current Medications (verified) Outpatient Encounter Medications as of 08/09/2022  Medication Sig   apixaban (ELIQUIS) 5 MG TABS tablet Take 1 tablet (5 mg total) by mouth 2 (two) times daily.   buPROPion (WELLBUTRIN SR) 200 MG 12 hr tablet Take 1 tablet (200 mg total) by mouth 2 (two) times daily.   Cholecalciferol 50 MCG (2000 UT) CAPS Take 1 capsule by mouth daily.    citalopram (CELEXA) 40 MG tablet Take 1 tablet (40 mg total) by mouth daily.   ezetimibe (ZETIA) 10 MG tablet Take 1 tablet (10 mg total) by mouth daily.   levothyroxine (SYNTHROID) 75 MCG tablet Take 1 tablet (75 mcg total) by mouth daily.   melatonin 3 MG TABS tablet Take 1 tablet (3 mg total) by mouth at bedtime.   metoprolol succinate (TOPROL XL) 25 MG 24 hr tablet Take 1 tablet (25 mg total) by mouth daily.   omeprazole  (PRILOSEC) 20 MG capsule Take 1 capsule (20 mg total) by mouth daily.   rosuvastatin (CRESTOR) 40 MG tablet Take 1 tablet (40 mg total) by mouth daily.   No facility-administered encounter medications on file as of 08/09/2022.    Allergies (verified) Benzodiazepines, Codeine, and Tetracycline   History: Past Medical History:  Diagnosis Date   AAA (abdominal aortic aneurysm) ([place])    a.) s/p EVAR in 08/2007 by Dr. Hulda Humphrey. b.) CTA on 06/30/2021 --> interval aneurysmal dilitation superior to stent graft with the juxtarenal aorta measuring 3.8 cm.   Anemia    Arthritis    both knees   Chronic anticoagulation    Apixaban   CKD (chronic kidney disease), stage II    Coronary artery disease    a.) 4v CABG @ [place] 06/2007 (LIMA-LAD, VG-RPDA, VG-OM3,VG-D2); b.) LHC 2011: patent grafts EF 40%, c.) LHC 06/05/18: LM nl, pLAD 50%, p-mLAD 50%, ostD1 70%, ost ramus 30%, ost-pLCx 40%, pLCx 100% (chronically occluded), OM3 100%, p-dRCA 100% (chronically occluded), LIMA-LAD patent, VG-RPDA patent, VG-OM3 mid-graft 60% and 80%   Depression    GERD (gastroesophageal reflux disease)    Hernia of abdominal cavity  History of 2019 novel coronavirus disease (COVID-19) 09/2019   Hyperlipidemia    Hypertension    Hypothyroidism    Iliac artery aneurysm, right ([place])    a.) at the distal landing zone of the right iliac limb measuring 3 cm   Right middle lobe pulmonary nodule 06/30/2021   a.) measured 8 mm by CT on 06/30/2021.   S/P CABG x 4 06/25/2007   a.) LIMA-LAD, SVG-RPDA, SVG-OM3, SVG-D2   Sigmoid diverticulosis    Ventricular tachycardia ([place]) 05/2018   a.) found unresponsive by EMS; VT noted --> defibrillation achieved ROSC. Briefly intubated. Episode felt to be secondary to heat stoke.   Past Surgical History:  Procedure Laterality Date   ABDOMINAL AORTIC ANEURYSM REPAIR N/A 08/29/2007   Procedure: EVAR; Location: [place]; Surgeon: Collene Schlichter, MD   ABDOMINAL AORTIC ANEURYSM REPAIR N/A 12/05/2012    Procedure: Abdominal aortic aneurysm of approximately 6 cm in maximal diameter status post previous endovascular repair with type I and III endoleaks; Location: [place]; Surgeon: Leotis Pain, MD   CARDIAC CATHETERIZATION Left 06/28/2010   3v CAD with patent CABG grafts; LVEF 40%; stable aortic stent graft; Location: [place]; Surgeon: Serafina Royals, MD   CARDIAC CATHETERIZATION Left 06/18/2007   3v CAD; LVEF 50%; refer to CVTS for CABG; Location: [place]; Surgeon: [name], MD   COLONOSCOPY WITH PROPOFOL N/A 11/17/2021   Procedure: COLONOSCOPY WITH PROPOFOL;  Surgeon: Lin Landsman, MD;  Location: [facility] ENDOSCOPY;  Service: Gastroenterology;  Laterality: N/A;   CORONARY ARTERY BYPASS GRAFT N/A 06/25/2007   Procedure: 4v CABG (LIMA-LAD, SVG-RPDA, SVG-OM3, SVG-D2); Location: [facility]; Surgeon: Marnee Guarneri, MD   CORONARY/GRAFT ANGIOGRAPHY N/A 06/05/2018   Procedure: Remus Blake ANGIOGRAPHY;  Surgeon: Nelva Bush, MD;  Location: [place] CV LAB;  Service: Cardiovascular;  Laterality: N/A;   ENDOVASCULAR REPAIR/STENT GRAFT N/A 08/04/2021   Procedure: ENDOVASCULAR REPAIR/STENT GRAFT;  Surgeon: Algernon Huxley, MD;  Location: [place] CV LAB;  Service: Cardiovascular;  Laterality: N/A;   EXPLORATORY LAPAROTOMY  1997   HERNIA REPAIR     KNEE ARTHROPLASTY Left 09/19/2016   Procedure: COMPUTER ASSISTED TOTAL KNEE ARTHROPLASTY;  Surgeon: Dereck Leep, MD;  Location: [facility] ORS;  Service: Orthopedics;  Laterality: Left;   RIGHT/LEFT HEART CATH AND CORONARY ANGIOGRAPHY N/A 08/05/2022   Procedure: RIGHT/LEFT HEART CATH AND CORONARY ANGIOGRAPHY;  Surgeon: Wellington Hampshire, MD;  Location: [place] CV LAB;  Service: Cardiovascular;  Laterality: N/A;   TOTAL KNEE ARTHROPLASTY Right 09/2016   Family History  Problem Relation Age of Onset   Heart disease Mother    Heart attack Mother    Hypertension Mother    Heart attack Father    Hypertension Father    Kidney disease Brother    Depression  Maternal Grandmother    Depression Maternal Grandfather    Heart disease Other    Heart attack Other    Prostate cancer Neg Hx    Kidney cancer Neg Hx    Stomach cancer Neg Hx    Colon cancer Neg Hx    Social History   Socioeconomic History   Marital status: Married    Spouse name: Not on file   Number of children: Not on file   Years of education: 14   Highest education level: Some college, no degree  Occupational History   Occupation: retired  Tobacco Use   Smoking status: Former    Packs/day: 1.00    Years: 35.00    Total pack years: 35.00    Types: Cigarettes  Quit date: 12/16/2005    Years since quitting: 16.6   Smokeless tobacco: Current    Types: Chew   Tobacco comments:    pt states he very rarely uses smokeless tobacco   Vaping Use   Vaping Use: Never used  Substance and Sexual Activity   Alcohol use: No   Drug use: No   Sexual activity: Yes    Partners: Female  Other Topics Concern   Not on file  Social History Narrative   Not on file   Social Determinants of Health   Financial Resource Strain: Low Risk  (08/09/2022)   Overall Financial Resource Strain (CARDIA)    Difficulty of Paying Living Expenses: Not hard at all  Food Insecurity: No Food Insecurity (08/09/2022)   Hunger Vital Sign    Worried About Running Out of Food in the Last Year: Never true    Ran Out of Food in the Last Year: Never true  Transportation Needs: No Transportation Needs (08/09/2022)   PRAPARE - Hydrologist (Medical): No    Lack of Transportation (Non-Medical): No  Physical Activity: Insufficiently Active (08/09/2022)   Exercise Vital Sign    Days of Exercise per Week: 3 days    Minutes of Exercise per Session: 30 min  Stress: No Stress Concern Present (08/09/2022)   [place]    Feeling of Stress : Not at all  Social Connections: [place] (08/09/2022)   Social Connection  and Isolation Panel [NHANES]    Frequency of Communication with Friends and Family: More than three times a week    Frequency of Social Gatherings with Friends and Family: Once a week    Attends Religious Services: More than 4 times per year    Active Member of [employer] or Organizations: Yes    Attends Archivist Meetings: Never    Marital Status: Married    Tobacco Counseling Ready to quit: Not Answered Counseling given: Not Answered Tobacco comments: pt states he very rarely uses smokeless tobacco    Clinical Intake:  Pre-visit preparation completed: Yes  Pain : No/denies pain Pain Score: 0-No pain   Diabetes: No  How often do you need to have someone help you when you read instructions, pamphlets, or other written materials from your doctor or pharmacy?: 2 - Rarely What is the last grade level you completed in school?: 12th grade, some college course  Diabetic- Yes  Interpreter Needed?: No   Activities of Daily Living    08/09/2022   11:04 AM 08/05/2022    7:14 AM  In your present state of health, do you have any difficulty performing the following activities:  Hearing? 1 0  Vision? 0 0  Difficulty concentrating or making decisions? 0 0  Walking or climbing stairs? 1 0  Comment some SOB with climbing stairs   Dressing or bathing? 0 0  Doing errands, shopping? 0     Patient Care Team: Venita Lick, NP as PCP - General (Nurse Practitioner) Wellington Hampshire, MD as PCP - Cardiology (Cardiology) Lucky Cowboy Erskine Squibb, MD as Referring Physician (Vascular Surgery) Wellington Hampshire, MD as Consulting Physician (Cardiology) Vladimir Faster, [facility] (Inactive) (Pharmacist) Anell Barr, OD (Optometry) Fabio Bering, DMD as Referring Physician (Dentistry)  Indicate any recent Medical Services you may have received from other than [facility] providers in the past year (date may be approximate).     Assessment:   This  is a routine wellness examination for  [facility].  Hearing/Vision screen No results found.  Dietary issues and exercise activities discussed:     Goals Addressed   None   Depression Screen    08/09/2022   11:01 AM 09/29/2021    9:10 AM 08/06/2021   11:21 AM 07/30/2021    9:19 AM 01/05/2021    1:11 PM 08/03/2020   11:20 AM 07/03/2020    1:22 PM  PHQ 2/9 Scores  PHQ - 2 Score 0 0 0 0 0 0 0  PHQ- 9 Score '2 1  3 3 '$ 0 2    Fall Risk    08/09/2022   11:04 AM 09/29/2021    9:05 AM 08/06/2021   11:20 AM 08/03/2020   11:20 AM 07/15/2019    1:53 PM  Fall Risk   Falls in the past year? 0 0 1 0 0  Comment   tripped going up the steps    Number falls in past yr: 0 0 0    Injury with Fall? 0 0 0    Risk for fall due to : No Fall Risks No Fall Risks Medication side effect Medication side effect   Follow up Falls evaluation completed Falls evaluation completed Falls evaluation completed;Education provided;Falls prevention discussed Falls evaluation completed;Education provided;Falls prevention discussed     FALL RISK PREVENTION PERTAINING TO THE HOME:  Any stairs in or around the home? Yes  If so, are there any without handrails? No  Home free of loose throw rugs in walkways, pet beds, electrical cords, etc? Yes  Adequate lighting in your home to reduce risk of falls? No   ASSISTIVE DEVICES UTILIZED TO PREVENT FALLS:  Life alert? No  Use of a cane, walker or w/c? No  Grab bars in the bathroom? No  Shower chair or bench in shower? No  Elevated toilet seat or a handicapped toilet? No   TIMED UP AND GO:  Was the test performed? No .   Cognitive Function:    07/01/2016    1:52 PM  MMSE - Mini Mental State Exam  Orientation to time 5  Orientation to Place 5  Registration 3  Attention/ Calculation 5  Recall 2  Language- name 2 objects 2  Language- repeat 1  Language- follow 3 step command 3  Language- read & follow direction 1  Write a sentence 1  Copy design 1  Total score 29        08/09/2022   11:05 AM  08/06/2021   11:23 AM 08/03/2020   11:23 AM 07/12/2018    2:03 PM 07/05/2017    9:47 AM  6CIT Screen  What Year? 0 points 0 points 0 points 0 points 0 points  What month? 0 points 0 points 0 points 0 points 0 points  What time? 0 points 0 points 0 points 0 points 0 points  Count back from 20 0 points 0 points 0 points 0 points 0 points  Months in reverse 0 points 0 points 0 points 0 points 0 points  Repeat phrase 2 points 0 points 0 points 0 points 0 points  Total Score 2 points 0 points 0 points 0 points 0 points    Immunizations Immunization History  Administered Date(s) Administered   Fluad Quad(high Dose 65+) 09/29/2021   Janssen (J&J) SARS-COV-2 Vaccination 03/09/2020   Pneumococcal Conjugate-13 07/01/2016   Pneumococcal Polysaccharide-23 10/07/2009, 07/07/2017   Tdap 06/16/2015   Zoster, Live 06/16/2015    TDAP status: Up to date  Flu Vaccine status: Declined, Education has been provided regarding the importance of this vaccine but patient still declined. Advised may receive this vaccine at local pharmacy or Health Dept. Aware to provide a copy of the vaccination record if obtained from local pharmacy or Health Dept. Verbalized acceptance and understanding.  Pneumococcal vaccine status: Up to date  Covid-19 vaccine status: Information provided on how to obtain vaccines.   Qualifies for Shingles Vaccine? Yes   Zostavax completed No   Shingrix Completed?: No.    Education has been provided regarding the importance of this vaccine. Patient has been advised to call insurance company to determine out of pocket expense if they have not yet received this vaccine. Advised may also receive vaccine at local pharmacy or Health Dept. Verbalized acceptance and understanding.  Screening Tests Health Maintenance  Topic Date Due   Zoster Vaccines- Shingrix (1 of 2) Never done   COVID-19 Vaccine (2 - Booster for Janssen series) 05/04/2020   INFLUENZA VACCINE  06/14/2022   TETANUS/TDAP   06/15/2025   COLONOSCOPY (Pts 45-99yr Insurance coverage will need to be confirmed)  11/17/2028   Pneumonia Vaccine 71 Years old  Completed   Hepatitis C Screening  Completed   HPV VACCINES  Aged Out    Health Maintenance  Health Maintenance Due  Topic Date Due   Zoster Vaccines- Shingrix (1 of 2) Never done   COVID-19 Vaccine (2 - Booster for Janssen series) 05/04/2020   INFLUENZA VACCINE  06/14/2022    Colorectal cancer screening: Type of screening: Colonoscopy. Completed 11/17/21. Repeat every 7 years  Lung Cancer Screening: (Low Dose CT Chest recommended if Age 71-80years, 30 pack-year currently smoking OR have quit w/in 15years.) does not qualify. Patient stopped smoking over 15 years ago.  Lung Cancer Screening Referral: N/A  Additional Screening:  Hepatitis C Screening: does qualify; Completed 08/20/18  Vision Screening: Recommended annual ophthalmology exams for early detection of glaucoma and other disorders of the eye. Is the patient up to date with their annual eye exam?  No  Who is the provider or what is the name of the office in which the patient attends annual eye exams? Dr. WEllin MayhewIf pt is not established with a provider, would they like to be referred to a provider to establish care? No .   Dental Screening: Recommended annual dental exams for proper oral hygiene  Community Resource Referral / Chronic Care Management: CRR required this visit?  No   CCM required this visit?  No      Plan:     I have personally reviewed and noted the following in the patient's chart:   Medical and social history Use of alcohol, tobacco or illicit drugs  Current medications and supplements including opioid prescriptions. Patient is not currently taking opioid prescriptions. Functional ability and status Nutritional status Physical activity Advanced directives List of other physicians Hospitalizations, surgeries, and ER visits in previous 12  months Vitals Screenings to include cognitive, depression, and falls Referrals and appointments  In addition, I have reviewed and discussed with patient certain preventive protocols, quality metrics, and best practice recommendations. A written personalized care plan for preventive services as well as general preventive health recommendations were provided to patient.     BGeorgina Peer [place]  08/09/2022   Nurse Notes: Face to Face 30 minutes.     Mr. MSzabo, Thank you for taking time to come for your Medicare Wellness Visit. I appreciate your ongoing commitment to your health goals.  Please review the following plan we discussed and let me know if I can assist you in the future.   These are the goals we discussed:  Goals      DIET - INCREASE WATER INTAKE     Recommend drinking at least 6-8 glasses of water a day      Patient Stated     08/03/2020,stay healthy     Patient Stated     08/06/2021, wants to get back to normal     PharmD "My Eliquis is too expensive"     [place] (see longitudinal plan of care for additional care plan information)  Current Barriers:  Chronic Disease Management support, education, and care coordination needs related to Hypertension, Hyperlipidemia, and Atrial Fibrillation   Hypertension CKD stage 3 BP Readings from Last 3 Encounters:  07/03/20 133/83  06/19/20 128/81  06/09/20 (!) 90/60  Pharmacist Clinical Goal(s): Over the next 90 days, patient will work with PharmD and providers to maintain BP goal <130/80 Current regimen:  None (recent metoprolol d/c due to  hypotension 90/60 by cards) Interventions: Comprehensive medication review performed, medication list updated in electronic medical record Inter-disciplinary care team collaboration (see longitudinal plan of care)  Patient self care activities - Over the next 90 days, patient will: Check BP three times weekly, document, and provide at future appointments Ensure daily salt  intake < 2300 mg/day  Hyperlipidemia Lab Results  Component Value Date/Time   [facility] 77 07/03/2020 01:57 PM  Pharmacist Clinical Goal(s): Over the next 90 days, patient will work with PharmD and providers to achieve LDL goal < 70 Current regimen:  Rosuvastatin 40 mg qd Interventions: Comprehensive medication review performed, medication list updated in electronic medical record Inter-disciplinary care team collaboration (see longitudinal plan of care) Discussed possible addition of ezetemibe if LDL continues above goal Patient self care activities - Over the next 90 days, patient will: Practice DASH eating plan Aim for 30 minutes of exercise 5 days per week. A FIB Pharmacist Clinical Goal(s) Over the next 90 days, patient will work with PharmD and providers to eliminate financial barriers related to Eliquis Current regimen:  Eliquis 5 mg bid Interventions: Comprehensive medication review performed, medication list updated in electronic medical record Inter-disciplinary care team collaboration (see longitudinal plan of care) Will initiate PAP process for Eliquis and help patient obtain sufficient quantity until this can be completed. Patient self care activities - Over the next 90 days, patient will: Complete required documentation portion of PAP.  Medication management Pharmacist Clinical Goal(s): Over the next 90 days, patient will work with PharmD and providers to achieve optimal medication adherence Current pharmacy: [facility]  Interventions Comprehensive medication review performed. Continue current medication management strategy Patient self care activities - Over the next 90 days, patient will: Focus on medication adherence by continuing current adherence measures Take medications as prescribed Report any questions or concerns to PharmD and/or provider(s)  Initial goal documentation      Quit smoking / using tobacco     Tobacco cessation discussed         This is a list of the screening recommended for you and due dates:  Health Maintenance  Topic Date Due   Zoster (Shingles) Vaccine (1 of 2) Never done   COVID-19 Vaccine (2 - Booster for Janssen series) 05/04/2020   Flu Shot  06/14/2022   Tetanus Vaccine  06/15/2025   Colon Cancer Screening  11/17/2028   Pneumonia Vaccine  Completed   Hepatitis C  Screening: USPSTF Recommendation to screen - Ages 79-79 yo.  Completed   HPV Vaccine  Aged Out

## 2022-08-16 ENCOUNTER — Ambulatory Visit: Payer: Medicare HMO | Admitting: Physician Assistant

## 2022-08-17 NOTE — Progress Notes (Unsigned)
Cardiology Office Note:    Date:  08/18/2022   ID:  Stuart Harvey, DOB [birth year], MRN [id number]  PCP:  Venita Lick, NP   [place] Prov[facility] Cardiologist:  [name], MD     Referring MD: [facility]   CC: Here for hospital follow-up  History of Present Illness:    Stuart Harvey is a 71 y.o. male with a hx of the following:  CAD, s/p MI, hx of CABG at [facility] in 2008 HLD HTN CKD stage 3, renal mass AAA, s/p EVAR in 2008; aortic dilatation Paroxysmal A-fib, on Eliquis HFrEF (LVEF35-40%) Hypothyroidism  In 2019 he was hospitalized due to unresponsiveness, went down for 1 hour with possible seizure-like activities.  Had numerous contusions.  With EMS he was found to be in V. tach upon arrival, had cardioverted.  Had mildly elevated troponin, CPK was close to 4000.  Echo revealed EF of 50 to 55%.  Cardiac cath showed severe underlying three-vessel CAD with patent grafts including SVG to RCA, LIMA to LAD, SVG to diagonal, and SVG to OM 3.  There was borderline disease found in SVG to OM, but this was treated medically.  Did develop A-fib in the hospital.  Was cardioverted spontaneously and thought his presentation was due to/triggered by heatstroke.  Underwent endovascular repair of iliac artery aneurysm in 2022.  He follows vascular surgery for this.  Last seen by Dr. Fletcher Anon in August 2023.  Reported occasional wheezing and exertional dyspnea, had some episodes of substernal chest pain that was left-sided, described as aching.  Nuclear medicine stress test found findings consistent with prior MI, intermediate risk.  No ST deviation was noted.  LV perfusion was abnormal there was no evidence of ischemia.  Calculated EF 38%.  Echo done the following month revealed EF 35 to 40%, global hypokinesis of left ventricle, mild LVH, grade 1 diastolic dysfunction, aortic dilatation of aortic root, 43 mm.  Ascending aorta dilatation measuring 42 mm.  Trivial regurg  along MV and AV, otherwise no significant valvular abnormalities.  Right and left heart cath was arranged and underwent procedure on August 05, 2022.  Was found to have significant three-vessel CAD, EF moderately reduced by echo, right heart cath showed right and left side normal filling pressures, normal CO and normal pulmonary pressures.  Was recommended to continue medical therapy for CAD and optimize GDMT.  He was euvolemic.  Today he presents for 2-week follow-up.  He states he continues to have [facility] with exertion. Wife who is present says he is short of breath when going to the mailbox and he says this also occurs with weed eating. Says his breathing status has not worsened and is at his baseline. Denies any recent CP, dizziness, syncope, presyncope, orthopnea, PND, bleeding, or claudication. Says 3-4 weeks ago, he contracted a stomach bug and says he had some dark stool, but this cleared up. Positive family CV history includes his mother who died from [place] and states his father had several CV problems. He denies tobacco use but does dip. BP tends to be on the soft side at home. Denies any abdominal pain and denies any other questions or concerns.   Past Medical History:  Diagnosis Date   AAA (abdominal aortic aneurysm) ([place])    a.) s/p EVAR in 08/2007 by Dr. Hulda Humphrey. b.) CTA on 06/30/2021 --> interval aneurysmal dilitation superior to stent graft with the juxtarenal aorta measuring 3.8 cm.   Anemia    Arthritis  both knees   Chronic anticoagulation    Apixaban   CKD (chronic kidney disease), stage II    Coronary artery disease    a.) 4v CABG @ [facility] 06/2007 (LIMA-LAD, VG-RPDA, VG-OM3,VG-D2); b.) LHC 2011: patent grafts EF 40%, c.) LHC 06/05/18: LM nl, pLAD 50%, p-mLAD 50%, ostD1 70%, ost ramus 30%, ost-pLCx 40%, pLCx 100% (chronically occluded), OM3 100%, p-dRCA 100% (chronically occluded), LIMA-LAD patent, VG-RPDA patent, VG-OM3 mid-graft 60% and 80%   Depression    GERD (gastroesophageal  reflux disease)    Hernia of abdominal cavity    History of 2019 novel coronavirus disease (COVID-19) 09/2019   Hyperlipidemia    Hypertension    Hypothyroidism    Iliac artery aneurysm, right ([place])    a.) at the distal landing zone of the right iliac limb measuring 3 cm   Right middle lobe pulmonary nodule 06/30/2021   a.) measured 8 mm by CT on 06/30/2021.   S/P CABG x 4 06/25/2007   a.) LIMA-LAD, SVG-RPDA, SVG-OM3, SVG-D2   Sigmoid diverticulosis    Ventricular tachycardia ([place]) 05/2018   a.) found unresponsive by EMS; VT noted --> defibrillation achieved ROSC. Briefly intubated. Episode felt to be secondary to heat stoke.    Past Surgical History:  Procedure Laterality Date   ABDOMINAL AORTIC ANEURYSM REPAIR N/A 08/29/2007   Procedure: EVAR; Location: [place]; Surgeon: Collene Schlichter, MD   ABDOMINAL AORTIC ANEURYSM REPAIR N/A 12/05/2012   Procedure: Abdominal aortic aneurysm of approximately 6 cm in maximal diameter status post previous endovascular repair with type I and III endoleaks; Location: [place]; Surgeon: Leotis Pain, MD   CARDIAC CATHETERIZATION Left 06/28/2010   3v CAD with patent CABG grafts; LVEF 40%; stable aortic stent graft; Location: [place]; Surgeon: Serafina Royals, MD   CARDIAC CATHETERIZATION Left 06/18/2007   3v CAD; LVEF 50%; refer to CVTS for CABG; Location: [place]; Surgeon: [name], MD   COLONOSCOPY WITH PROPOFOL N/A 11/17/2021   Procedure: COLONOSCOPY WITH PROPOFOL;  Surgeon: Lin Landsman, MD;  Location: [facility] ENDOSCOPY;  Service: Gastroenterology;  Laterality: N/A;   CORONARY ARTERY BYPASS GRAFT N/A 06/25/2007   Procedure: 4v CABG (LIMA-LAD, SVG-RPDA, SVG-OM3, SVG-D2); Location: [place]; Surgeon: Marnee Guarneri, MD   CORONARY/GRAFT ANGIOGRAPHY N/A 06/05/2018   Procedure: Remus Blake ANGIOGRAPHY;  Surgeon: Nelva Bush, MD;  Location: [place] CV LAB;  Service: Cardiovascular;  Laterality: N/A;   ENDOVASCULAR REPAIR/STENT GRAFT N/A 08/04/2021    Procedure: ENDOVASCULAR REPAIR/STENT GRAFT;  Surgeon: Algernon Huxley, MD;  Location: [place] CV LAB;  Service: Cardiovascular;  Laterality: N/A;   EXPLORATORY LAPAROTOMY  1997   HERNIA REPAIR     KNEE ARTHROPLASTY Left 09/19/2016   Procedure: COMPUTER ASSISTED TOTAL KNEE ARTHROPLASTY;  Surgeon: Dereck Leep, MD;  Location: [facility] ORS;  Service: Orthopedics;  Laterality: Left;   RIGHT/LEFT HEART CATH AND CORONARY ANGIOGRAPHY N/A 08/05/2022   Procedure: RIGHT/LEFT HEART CATH AND CORONARY ANGIOGRAPHY;  Surgeon: Wellington Hampshire, MD;  Location: [place] CV LAB;  Service: Cardiovascular;  Laterality: N/A;   TOTAL KNEE ARTHROPLASTY Right 09/2016    Current Medications: Current Meds  Medication Sig   apixaban (ELIQUIS) 5 MG TABS tablet Take 1 tablet (5 mg total) by mouth 2 (two) times daily.   buPROPion (WELLBUTRIN SR) 200 MG 12 hr tablet Take 1 tablet (200 mg total) by mouth 2 (two) times daily.   Cholecalciferol 50 MCG (2000 UT) CAPS Take 1 capsule by mouth daily.    citalopram (CELEXA) 40 MG tablet Take 1 tablet (40  mg total) by mouth daily.   ezetimibe (ZETIA) 10 MG tablet Take 1 tablet (10 mg total) by mouth daily.   levothyroxine (SYNTHROID) 75 MCG tablet Take 1 tablet (75 mcg total) by mouth daily.   melatonin 3 MG TABS tablet Take 1 tablet (3 mg total) by mouth at bedtime.   metoprolol succinate (TOPROL XL) 25 MG 24 hr tablet Take 1 tablet (25 mg total) by mouth daily.   omeprazole (PRILOSEC) 20 MG capsule Take 1 capsule (20 mg total) by mouth daily.   rosuvastatin (CRESTOR) 40 MG tablet Take 1 tablet (40 mg total) by mouth daily.     Allergies:   Benzodiazepines, Codeine, and Tetracycline   Social History   Socioeconomic History   Marital status: Married    Spouse name: Not on file   Number of children: Not on file   Years of education: 14   Highest education level: Some college, no degree  Occupational History   Occupation: retired  Tobacco Use   Smoking status:  Former    Packs/day: 1.00    Years: 35.00    Total pack years: 35.00    Types: Cigarettes    Quit date: 12/16/2005    Years since quitting: 16.6   Smokeless tobacco: Current    Types: Chew   Tobacco comments:    pt states he very rarely uses smokeless tobacco   Vaping Use   Vaping Use: Never used  Substance and Sexual Activity   Alcohol use: No   Drug use: No   Sexual activity: Yes    Partners: Female  Other Topics Concern   Not on file  Social History Narrative   Not on file   Social Determinants of Health   Financial Resource Strain: Low Risk  (08/09/2022)   Overall Financial Resource Strain (CARDIA)    Difficulty of Paying Living Expenses: Not hard at all  Food Insecurity: No Food Insecurity (08/09/2022)   Hunger Vital Sign    Worried About Running Out of Food in the Last Year: Never true    Ran Out of Food in the Last Year: Never true  Transportation Needs: No Transportation Needs (08/09/2022)   PRAPARE - Hydrologist (Medical): No    Lack of Transportation (Non-Medical): No  Physical Activity: Insufficiently Active (08/09/2022)   Exercise Vital Sign    Days of Exercise per Week: 3 days    Minutes of Exercise per Session: 30 min  Stress: No Stress Concern Present (08/09/2022)   [place]    Feeling of Stress : Not at all  Social Connections: [place] (08/09/2022)   Social Connection and Isolation Panel [NHANES]    Frequency of Communication with Friends and Family: More than three times a week    Frequency of Social Gatherings with Friends and Family: Once a week    Attends Religious Services: More than 4 times per year    Active Member of [employer] or Organizations: Yes    Attends Archivist Meetings: Never    Marital Status: Married     Family History: The patient's family history includes Depression in his maternal grandfather and maternal grandmother;  Heart attack in his father, mother, and another family member; Heart disease in his mother and another family member; Hypertension in his father and mother; Kidney disease in his brother. There is no history of Prostate cancer, Kidney cancer, Stomach cancer, or Colon cancer.  ROS:  Review of Systems  Constitutional: Negative.  Negative for chills, diaphoresis, fever, malaise/fatigue and weight loss.  HENT: Negative.    Eyes: Negative.   Respiratory:  Positive for shortness of breath. Negative for cough, hemoptysis, sputum production and wheezing.        See HPI.   Cardiovascular: Negative.   Gastrointestinal: Negative.        3-4 weeks dark stools, says this happens occasionally, may be internal hemorrhoid related.   Genitourinary: Negative.   Musculoskeletal: Negative.   Skin: Negative.        Recent injury to right hand - healing.   Neurological: Negative.   Endo/Heme/Allergies:  Negative for environmental allergies and polydipsia. Bruises/bleeds easily.  Psychiatric/Behavioral: Negative.      Please see the history of present illness.    All other systems reviewed and are negative.  EKGs/Labs/Other Studies Reviewed:    The following studies were reviewed today:   EKG:  EKG is ordered today.  The ekg ordered today demonstrates NSR, 77 bpm, short PR (92 ms) with occasional PVC's, nonspecific ST and T wave abnormality and LAFB.    Right and left heart cath and coronary angiography on August 05, 2022:   Prox LAD lesion is 50% stenosed.   Prox LAD to Mid LAD lesion is 90% stenosed.   Ost Cx to Prox Cx lesion is 40% stenosed.   Prox Cx lesion is 100% stenosed.   Prox RCA to Dist RCA lesion is 100% stenosed.   Ost Ramus lesion is 30% stenosed.   Ost 1st Diag lesion is 70% stenosed.   3rd Mrg lesion is 100% stenosed.   Origin lesion is 100% stenosed.   LIMA and is normal in caliber.   SVG and is normal in caliber.   SVG.   The graft exhibits no disease.   The graft  exhibits minimal luminal irregularities.   The graft exhibits mild .   1.  Significant three-vessel coronary artery disease with patent grafts including LIMA to LAD, SVG to diagonal and SVG to right PDA.  Occluded SVG to OM which is new compared to most recent cardiac catheterization.  However, this is not unexpected given that the graft was diffusely diseased. 2.  Left ventricular angiography was not performed.  EF was moderately reduced by echo. 3.  Right heart catheterization showed normal right and left-sided filling pressures, normal pulmonary pressures and normal cardiac output.   Recommendations: Continue medical therapy for coronary artery disease. Optimize heart failure treatment.  The patient is euvolemic.  TTE on July 20, 2022:  1. Left ventricular ejection fraction, by estimation, is 35 to 40%. The  left ventricle has moderately decreased function. The left ventricle  demonstrates global hypokinesis. There is mild left ventricular  hypertrophy. Left ventricular diastolic  parameters are consistent with Grade I diastolic dysfunction (impaired  relaxation).   2. Right ventricular systolic function is low normal. The right  ventricular size is not well visualized.   3. The mitral valve is normal in structure. Trivial mitral valve  regurgitation.   4. The aortic valve is tricuspid. Aortic valve regurgitation is trivial.  Aortic valve sclerosis is present, with no evidence of aortic valve  stenosis.   5. Aortic dilatation noted. There is mild dilatation of the aortic root,  measuring 43 mm. There is mild dilatation of the ascending aorta,  measuring 42 mm.   6. The inferior vena cava is normal in size with greater than 50%  respiratory variability, suggesting right atrial pressure  of 3 mmHg.    Nuclear medicine stress test on June 23, 2022:   Findings are consistent with prior myocardial infarction. The study is intermediate risk.   No ST deviation was noted.   LV  perfusion is abnormal. There is no evidence of ischemia. There is evidence of infarction. Defect 1: There is a medium defect with moderate reduction in uptake present in the apical to mid inferolateral location(s) that is fixed. There is abnormal wall motion in the defect area. Consistent with infarction. Defect 2: There is a medium defect with moderate reduction in uptake present in the basal inferior location(s) that is fixed. There is abnormal wall motion in the defect area. Consistent with infarction.   Left ventricular function is abnormal. Global function is moderately reduced.  Calculated ejection fraction is 38%.  Vascular ultrasound EVAR duplex on June 10, 2022: Abdominal Aorta: There is evidence of abnormal dilatation of the distal  Abdominal aorta. There is evidence of abnormal dilation of the Right  Common Iliac artery. Patent endovascular aneurysm repair with no evidence  of endoleak. Right mid CIA decreased in   size compared to previous exam. Left CIA is within normal limits. The  largest aortic diameter remains essentially unchanged compared to prior  exam. Previous diameter measurement was 3.0 cm.     Vascular ultrasound AAA duplex complete on December 07, 2021: Abdominal Aorta: There is evidence of abnormal dilation of the Right  Common Iliac artery. Patent endovascular aneurysm repair with no evidence  of endoleak. Right mid CIA aneurysm decreased in size compared to previous  study. Left CIA is within upper  limits normal size.   Recent Labs: 03/22/2022: ALT 18; TSH 3.960 07/25/2022: BUN 29; Creatinine, Ser 1.15; Hemoglobin 15.0; Platelets 154; Potassium 5.0; Sodium 140  Recent Lipid Panel    Component Value Date/Time   CHOL 142 03/29/2022 0913   CHOL 159 12/18/2015 0827   TRIG 105 03/29/2022 0913   TRIG 166 (H) 12/18/2015 0827   HDL 49 03/29/2022 0913   CHOLHDL 2.9 03/29/2022 0913   CHOLHDL 3.4 09/25/2018 1208   VLDL 31 09/25/2018 1208   VLDL 33 (H) 12/18/2015 0827    LDLCALC 74 03/29/2022 0913     Risk Assessment/Calculations:    CHA2DS2-VASc Score = 4  This indicates a 4.8% annual risk of stroke. The patient's score is based upon: CHF History: 1 HTN History: 1 Diabetes History: 0 Stroke History: 0 Vascular Disease History: 1 Age Score: 1 Gender Score: 0   Physical Exam:    VS:  BP 110/72 (BP Location: Left Arm, Patient Position: Sitting, Cuff Size: Normal)   Pulse 77   Ht 5' 11"  (1.803 m)   Wt 216 lb (98 kg)   SpO2 98%   BMI 30.13 kg/m     Wt Readings from Last 3 Encounters:  08/18/22 216 lb (98 kg)  08/09/22 217 lb 14.4 oz (98.8 kg)  08/05/22 217 lb (98.4 kg)     GEN: Well nourished, well developed 71 y.o. Caucasian male in no acute distress HEENT: Normal NECK: No JVD; No carotid bruits CARDIAC: S1/S2, RRR, no murmurs, rubs, gallops; 2+ peripheral pulses throughout, strong and equal bilaterally RESPIRATORY:  Clear and diminished to auscultation without rales, wheezing or rhonchi  ABDOMEN: Soft, non-tender, non-distended MUSCULOSKELETAL:  No edema; No deformity  SKIN: Thin skin, Warm and dry, wrapped right hand from previous injury NEUROLOGIC:  Alert and oriented x 3 PSYCHIATRIC:  Normal affect   ASSESSMENT:    1. Coronary  artery disease involving native coronary artery of native heart, unspecified whether angina present   2. Hyperlipidemia, unspecified hyperlipidemia type   3. Heart failure with reduced ejection fraction ([place])   4. PAF (paroxysmal atrial fibrillation) ([place])   5. Current use of long term anticoagulation   6. Stage 3 chronic kidney disease, unspecified whether stage 3a or 3b CKD ([place])   7. Abdominal aortic aneurysm (AAA) without rupture, unspecified part ([place])   8. Iliac artery aneurysm ([place])   9. S/P AAA repair   10. Aortic dilatation ([facility])    PLAN:    In order of problems listed above:  CAD Hx of previous MI and CABG in 2008 at [facility]. Right and LHC 07/2022 revealed significant 3 vessel CAD with  patent grafts including LIMA-LAD, SVG to diagnoal and SVG to R PDA. Occluded SVG to OM, which was new compared to previous cath. EF was moderately reduced, right heart cath showed normal right and left side filling pressures. Medical therapy recommended and optimize GDMT. Stable with no anginal symptoms. No indication for ischemic evaluation. Not on ASA d/t Eliquis. Continue Zetia, Metoprolol, and Crestor. Heart healthy diet and regular cardiovascular exercise encouraged.   HLD LDL 74 in 03/2022. Crestor 40 mg initiated by Dr. Fletcher Anon on 08/09/22. Will obtain FLP and LFT in 2 months. Continue Crestor and Zetia. Heart healthy diet and regular cardiovascular exercise encouraged.   HFrEF TEE 07/2022 revealed EF 35-40%, global hypokinesis of LV, mild LVH, grade 1 DD. RV systolic fxn low normal. Euvolemic and well compensated on exam. Low sodium diet, fluid restriction <2L, and daily weights encouraged. Educated to contact our office for weight gain of 2 lbs overnight or 5 lbs in one week. Kidney fxn stable. For GDMT, will initiate Farxiga 10 mg daily. Will repeat BMET in 1 week.   PAF, on chronic anticoagulation Denies any tachycardia or palpitations.  CHA2DS2-VASc score of 4.  Does not require reduced dosing of Eliquis.  Continue metoprolol and Eliquis. Will obtain CBC with BMET within 1 week.   CKD stage 3 Most recent BMET showed sCr 1.15 and eGFR > 60. Will initiate Farxiga 10 mg daily as mentioned above and repeat BMET in 1 week. Continue to follow with PCP.  AAA, s/p EVAR in 2008 / iliac artery aneurysm, s/p repair in 2022 Denies any symptoms. Recent duplex stable. Care precautions discussed. Continue to f/u with [facility] for this and for hx of iliac artery aneurysm repair in 2022.  Aortic dilatation Mild dilatation of aortic root noted on TTE, measuring 43 mm. Mild dilatation also noted of ascending aorta, measuring 42 mm. Plan to update 2D echo in 1 year for monitoring. Care precautions discussed.   8.  Disposition: Follow-up with Ignacia Bayley, NP or APP in 6-8 weeks or sooner if anything changes.       Medication Adjustments/Labs and Tests Ordered: Current medicines are reviewed at length with the patient today.  Concerns regarding medicines are outlined above.  Orders Placed This Encounter  Procedures   Basic metabolic panel   CBC   EKG 12-Lead   Meds ordered this encounter  Medications   dapagliflozin propanediol (FARXIGA) 10 MG TABS tablet    Sig: Take 1 tablet (10 mg total) by mouth daily before breakfast.    Dispense:  30 tablet    Refill:  11    Patient Instructions  Medication Instructions:  1.Start Farxiga 10 mg daily before breakfast *If you need a refill on your cardiac medications before your next  appointment, please call your pharmacy*   Lab Work: CBC and BMET in 1 week If you have labs (blood work) drawn today and your tests are completely normal, you will receive your results only by: [place] (if you have MyChart) OR A paper copy in the mail If you have any lab test that is abnormal or we need to change your treatment, we will call you to review the results.   Follow-Up: At [facility], you and your health needs are our priority.  As part of our continuing mission to provide you with exceptional heart care, we have created designated Provider Care Teams.  These Care Teams include your primary Cardiologist (physician) and Advanced Practice Providers (APPs -  Physician Assistants and Nurse Practitioners) who all work together to provide you with the care you need, when you need it  Your next appointment:   6-8 week(s)  The format for your next appointment:   In Person  Provider:   You may see [name], MD or one of the following Advanced Practice Providers on your designated Care Team:   Murray Hodgkins, NP Christell Faith, PA-C [place] Kathlen Mody, PA-C Gerrie Nordmann, NP   Important Information About Sugar         Signed, Finis Bud, NP  08/18/2022 4:44 PM    [place]

## 2022-08-18 ENCOUNTER — Encounter: Payer: Self-pay | Admitting: Nurse Practitioner

## 2022-08-18 ENCOUNTER — Ambulatory Visit: Payer: Medicare HMO | Attending: Physician Assistant | Admitting: Nurse Practitioner

## 2022-08-18 VITALS — BP 110/72 | HR 77 | Ht 71.0 in | Wt 216.0 lb

## 2022-08-18 DIAGNOSIS — I2510 Atherosclerotic heart disease of native coronary artery without angina pectoris: Secondary | ICD-10-CM | POA: Diagnosis not present

## 2022-08-18 DIAGNOSIS — Z7901 Long term (current) use of anticoagulants: Secondary | ICD-10-CM

## 2022-08-18 DIAGNOSIS — E785 Hyperlipidemia, unspecified: Secondary | ICD-10-CM | POA: Diagnosis not present

## 2022-08-18 DIAGNOSIS — I5020 Unspecified systolic (congestive) heart failure: Secondary | ICD-10-CM

## 2022-08-18 DIAGNOSIS — I480 Paroxysmal atrial fibrillation: Secondary | ICD-10-CM | POA: Diagnosis not present

## 2022-08-18 DIAGNOSIS — I714 Abdominal aortic aneurysm, without rupture, unspecified: Secondary | ICD-10-CM

## 2022-08-18 DIAGNOSIS — I77819 Aortic ectasia, unspecified site: Secondary | ICD-10-CM

## 2022-08-18 DIAGNOSIS — I723 Aneurysm of iliac artery: Secondary | ICD-10-CM

## 2022-08-18 DIAGNOSIS — Z8679 Personal history of other diseases of the circulatory system: Secondary | ICD-10-CM

## 2022-08-18 DIAGNOSIS — N183 Chronic kidney disease, stage 3 unspecified: Secondary | ICD-10-CM

## 2022-08-18 DIAGNOSIS — Z9889 Other specified postprocedural states: Secondary | ICD-10-CM

## 2022-08-18 MED ORDER — DAPAGLIFLOZIN PROPANEDIOL 10 MG PO TABS: 10.0000 mg | ORAL_TABLET | Freq: Every day | ORAL | 11 refills | Status: DC

## 2022-08-18 NOTE — Patient Instructions (Addendum)
Medication Instructions:  1.Start Farxiga 10 mg daily before breakfast *If you need a refill on your cardiac medications before your next appointment, please call your pharmacy*   Lab Work: CBC and BMET in 1 week If you have labs (blood work) drawn today and your tests are completely normal, you will receive your results only by: [place] (if you have MyChart) OR A paper copy in the mail If you have any lab test that is abnormal or we need to change your treatment, we will call you to review the results.   Follow-Up: At [facility], you and your health needs are our priority.  As part of our continuing mission to provide you with exceptional heart care, we have created designated Provider Care Teams.  These Care Teams include your primary Cardiologist (physician) and Advanced Practice Providers (APPs -  Physician Assistants and Nurse Practitioners) who all work together to provide you with the care you need, when you need it  Your next appointment:   6-8 week(s)  The format for your next appointment:   In Person  Provider:   You may see [name], MD or one of the following Advanced Practice Providers on your designated Care Team:   Murray Hodgkins, NP Christell Faith, PA-C Cadence Kathlen Mody, PA-C Gerrie Nordmann, NP   Important Information About Sugar

## 2022-08-25 ENCOUNTER — Other Ambulatory Visit
Admission: RE | Admit: 2022-08-25 | Discharge: 2022-08-25 | Disposition: A | Discharge status: Home / Self Care | Payer: Medicare HMO | Attending: Nurse Practitioner | Admitting: Nurse Practitioner

## 2022-08-25 DIAGNOSIS — I2510 Atherosclerotic heart disease of native coronary artery without angina pectoris: Secondary | ICD-10-CM | POA: Diagnosis present

## 2022-08-25 DIAGNOSIS — I5020 Unspecified systolic (congestive) heart failure: Secondary | ICD-10-CM | POA: Diagnosis present

## 2022-08-25 LAB — CBC
HCT: 43.4 % (ref 39.0–52.0)
Hemoglobin: 14 g/dL (ref 13.0–17.0)
MCH: 29 pg (ref 26.0–34.0)
MCHC: 32.3 g/dL (ref 30.0–36.0)
MCV: 90 fL (ref 80.0–100.0)
Platelets: 157 10*3/uL (ref 150–400)
RBC: 4.82 MIL/uL (ref 4.22–5.81)
RDW: 14.4 % (ref 11.5–15.5)
WBC: 8.3 10*3/uL (ref 4.0–10.5)
nRBC: 0 % (ref 0.0–0.2)

## 2022-08-25 LAB — BASIC METABOLIC PANEL
Anion gap: 4 — ABNORMAL LOW (ref 5–15)
BUN: 26 mg/dL — ABNORMAL HIGH (ref 8–23)
CO2: 23 mmol/L (ref 22–32)
Calcium: 9.1 mg/dL (ref 8.9–10.3)
Chloride: 112 mmol/L — ABNORMAL HIGH (ref 98–111)
Creatinine, Ser: 1.41 mg/dL — ABNORMAL HIGH (ref 0.61–1.24)
GFR, Estimated: 53 mL/min — ABNORMAL LOW (ref >60)
Glucose, Bld: 114 mg/dL — ABNORMAL HIGH (ref 70–99)
Potassium: 4.4 mmol/L (ref 3.5–5.1)
Sodium: 139 mmol/L (ref 135–145)

## 2022-08-26 ENCOUNTER — Other Ambulatory Visit: Payer: Self-pay | Admitting: *Deleted

## 2022-08-26 ENCOUNTER — Other Ambulatory Visit: Payer: Self-pay | Admitting: Nurse Practitioner

## 2022-08-26 DIAGNOSIS — N183 Chronic kidney disease, stage 3 unspecified: Secondary | ICD-10-CM

## 2022-08-26 DIAGNOSIS — I2510 Atherosclerotic heart disease of native coronary artery without angina pectoris: Secondary | ICD-10-CM

## 2022-08-26 DIAGNOSIS — Z951 Presence of aortocoronary bypass graft: Secondary | ICD-10-CM

## 2022-08-26 DIAGNOSIS — I5020 Unspecified systolic (congestive) heart failure: Secondary | ICD-10-CM

## 2022-08-26 DIAGNOSIS — I5022 Chronic systolic (congestive) heart failure: Secondary | ICD-10-CM

## 2022-08-26 NOTE — Telephone Encounter (Signed)
Requested Prescriptions  Pending Prescriptions Disp Refills  . omeprazole (PRILOSEC) 20 MG capsule [Pharmacy Med Name: OMEPRAZOLE DR 20 MG CAPSULE] 90 capsule 0    Sig: Take 1 capsule (20 mg total) by mouth daily.     Gastroenterology: Proton Pump Inhibitors Passed - 08/26/2022 11:18 AM      Passed - Valid encounter within last 12 months    Recent Outpatient Visits          5 months ago Hyperlipidemia, unspecified hyperlipidemia type   [facility] Vigg, Avanti, MD   11 months ago Screen for colon cancer   [facility] Vigg, Avanti, MD   1 year ago Screening for colon cancer   [facility] Vigg, Avanti, MD   1 year ago Hypertensive heart and kidney disease without heart failure and with stage 3a chronic kidney disease ([place])   Nicholas, [name], NP   2 years ago Major depressive disorder with single episode, in partial remission ([place])   [facility], Barbaraann Faster, NP      Future Appointments            In 3 weeks Sninsky, Herbert Seta, MD [place]   In 1 month Kathrine Haddock, [place], [place]   In 1 month Dunn, Areta Haber, PA-C [place]. [place]

## 2022-08-29 ENCOUNTER — Other Ambulatory Visit
Admission: RE | Admit: 2022-08-29 | Discharge: 2022-08-29 | Disposition: A | Discharge status: Home / Self Care | Payer: Medicare HMO | Attending: Nurse Practitioner | Admitting: Nurse Practitioner

## 2022-08-29 DIAGNOSIS — I2510 Atherosclerotic heart disease of native coronary artery without angina pectoris: Secondary | ICD-10-CM | POA: Insufficient documentation

## 2022-08-29 DIAGNOSIS — N183 Chronic kidney disease, stage 3 unspecified: Secondary | ICD-10-CM | POA: Insufficient documentation

## 2022-08-29 LAB — BASIC METABOLIC PANEL
Anion gap: 6 (ref 5–15)
BUN: 31 mg/dL — ABNORMAL HIGH (ref 8–23)
CO2: 23 mmol/L (ref 22–32)
Calcium: 9.2 mg/dL (ref 8.9–10.3)
Chloride: 110 mmol/L (ref 98–111)
Creatinine, Ser: 1.71 mg/dL — ABNORMAL HIGH (ref 0.61–1.24)
GFR, Estimated: 42 mL/min — ABNORMAL LOW (ref >60)
Glucose, Bld: 113 mg/dL — ABNORMAL HIGH (ref 70–99)
Potassium: 4.8 mmol/L (ref 3.5–5.1)
Sodium: 139 mmol/L (ref 135–145)

## 2022-09-01 ENCOUNTER — Telehealth: Payer: Self-pay | Admitting: Nurse Practitioner

## 2022-09-01 NOTE — Telephone Encounter (Signed)
Called and spoke with patient and updated regarding recent BMET results.  Has history of CKD and has seen [facility] in the past.  However, he has not seen them since 2020.  Explained that eGFR has gone from 53-42 within less than a week.  Discussed with RN that we will need urgent referral to nephrology, already established with [facility].  Looks like his baseline eGFR is from 49 to 55.  I discussed that [place]ade is contraindicated if eGFR drops less than 25 regarding treatment initiation for heart failure according to guidelines. Overall he is doing well and following CHF protocol well.   He will follow up with Christell Faith, PA-C in December. He verbalized understanding of our conversation and was appreciative of my call.  Finis Bud, NP

## 2022-09-02 ENCOUNTER — Other Ambulatory Visit: Payer: Self-pay | Admitting: *Deleted

## 2022-09-02 DIAGNOSIS — N183 Chronic kidney disease, stage 3 unspecified: Secondary | ICD-10-CM

## 2022-09-06 NOTE — Telephone Encounter (Signed)
Pt. Following back up with you has not heard about his referral

## 2022-09-07 ENCOUNTER — Telehealth: Payer: Self-pay | Admitting: Nurse Practitioner

## 2022-09-07 NOTE — Telephone Encounter (Signed)
Nephrology Referral- notes/ labs/ demographics, faxed to [facility], per Finis Bud, NP at [phone]. Fax confirmation received.   Patient notified through [facility] that his referral information was being sent today.

## 2022-09-12 ENCOUNTER — Other Ambulatory Visit: Payer: Self-pay

## 2022-09-12 ENCOUNTER — Encounter (INDEPENDENT_AMBULATORY_CARE_PROVIDER_SITE_OTHER): Payer: Self-pay

## 2022-09-12 ENCOUNTER — Encounter: Payer: Medicare HMO | Attending: Cardiovascular Disease

## 2022-09-12 DIAGNOSIS — I5022 Chronic systolic (congestive) heart failure: Secondary | ICD-10-CM

## 2022-09-12 NOTE — Progress Notes (Signed)
Virtual Visit completed. Patient informed on EP and RD appointment and 6 Minute walk test. Patient also informed of patient health questionnaires on My Chart. Patient Verbalizes understanding. Visit diagnosis can be found in [facility] 08/26/2022.

## 2022-09-19 ENCOUNTER — Other Ambulatory Visit: Payer: Self-pay | Admitting: Physician Assistant

## 2022-09-19 NOTE — Telephone Encounter (Signed)
Requested Prescriptions  Pending Prescriptions Disp Refills   ezetimibe (ZETIA) 10 MG tablet [Pharmacy Med Name: EZETIMIBE 10 MG TABLET] 90 tablet 1    Sig: Take 1 tablet (10 mg total) by mouth daily.     Cardiovascular:  Antilipid - Sterol Transport Inhibitors Failed - 09/19/2022  1:35 PM      Failed - Lipid Panel in normal range within the last 12 months    Cholesterol, Total  Date Value Ref Range Status  03/29/2022 142 100 - 199 mg/dL Final   Cholesterol Piccolo, Waived  Date Value Ref Range Status  12/18/2015 159 <200 mg/dL Final    Comment:                            Desirable                <200                         Borderline High      200- 239                         High                     >239    LDL Chol Calc (NIH)  Date Value Ref Range Status  03/29/2022 74 0 - 99 mg/dL Final   HDL  Date Value Ref Range Status  03/29/2022 49 >39 mg/dL Final   Triglycerides  Date Value Ref Range Status  03/29/2022 105 0 - 149 mg/dL Final   Triglycerides Piccolo,Waived  Date Value Ref Range Status  12/18/2015 166 (H) <150 mg/dL Final    Comment:                            Normal                   <150                         Borderline High     150 - 199                         High                200 - 499                         Very High                >499          Passed - AST in normal range and within 360 days    AST  Date Value Ref Range Status  03/22/2022 28 0 - 40 IU/L Final   SGOT(AST)  Date Value Ref Range Status  12/13/2014 30 15 - 37 Unit/L Final         Passed - ALT in normal range and within 360 days    ALT  Date Value Ref Range Status  03/22/2022 18 0 - 44 IU/L Final   SGPT (ALT)  Date Value Ref Range Status  12/13/2014 20 14 - 63 U/L Final         Passed - Patient is not pregnant  Passed - Valid encounter within last 12 months    Recent Outpatient Visits           5 months ago Hyperlipidemia, unspecified hyperlipidemia type    [facility] Vigg, Avanti, MD   11 months ago Screen for colon cancer   [facility] Vigg, Avanti, MD   1 year ago Screening for colon cancer   [facility] Vigg, Avanti, MD   1 year ago Hypertensive heart and kidney disease without heart failure and with stage 3a chronic kidney disease ([place])   [place], [name], NP   2 years ago Major depressive disorder with single episode, in partial remission ([place])   [facility] [place], Barbaraann Faster, NP       Future Appointments             In 2 days Diamantina [place], Herbert Seta, MD [place]   In 3 weeks Kathrine Haddock, NP [facility] [place]   In 3 weeks Dunn, Areta Haber, PA-C [place] [place]

## 2022-09-21 ENCOUNTER — Ambulatory Visit: Payer: Medicare HMO | Admitting: Urology

## 2022-09-21 ENCOUNTER — Encounter: Payer: Self-pay | Admitting: Urology

## 2022-09-21 VITALS — BP 115/74 | HR 78 | Ht 71.0 in | Wt 214.0 lb

## 2022-09-21 DIAGNOSIS — N401 Enlarged prostate with lower urinary tract symptoms: Secondary | ICD-10-CM

## 2022-09-21 DIAGNOSIS — R3915 Urgency of urination: Secondary | ICD-10-CM | POA: Diagnosis not present

## 2022-09-21 DIAGNOSIS — N3281 Overactive bladder: Secondary | ICD-10-CM | POA: Diagnosis not present

## 2022-09-21 NOTE — Patient Instructions (Signed)
Avoid caffeine, coffee, tea, sodas, diet drinks, as these can all irritate the bladder and cause increased urination, frequency, urgency, and urination overnight  Nocturia refers to the need to wake up during the night to urinate, which can disrupt your sleep and impact your overall well-being. Fortunately, there are several strategies you can employ to help prevent or manage nocturia. It's important to consult with your healthcare provider before making any significant changes to your routine. Here are some helpful strategies to consider:  Limit Fluid Intake Before Bed: Avoid drinking large amounts of fluids in the evening, especially within a few hours of bedtime. Consume most of your daily fluid intake earlier in the day to reduce the need to urinate at night.  Monitor Your Diet: Limit your intake of caffeine and alcohol, as these substances can increase urine production and irritate the bladder.  Avoid diet, "zero calorie," and artificially sweetened drinks, especially sodas, in the afternoon or evening. Be mindful of consuming foods and drinks with high water content before bedtime, such as watermelon and herbal teas.  Time Your Medications: If you're taking medications that contribute to increased urination, consult your healthcare provider about adjusting the timing of these medications to minimize their impact during the night.  Practice Double Voiding: Before going to bed, make an effort to empty your bladder twice within a short period. This can help reduce the amount of urine left in your bladder before sleep.  Bladder Training: Gradually increase the time between bathroom visits during the day to train your bladder to hold larger volumes of urine. Over time, this can help reduce the frequency of nighttime awakenings to urinate.  Elevate Your Legs During the Day: Elevating your legs during the day can help minimize fluid retention in your lower extremities, which might reduce  nighttime urination.  Pelvic Floor Exercises: Strengthening your pelvic floor muscles through Kegel exercises can help improve bladder control and potentially reduce the urge to urinate at night.  Create a Relaxing Bedtime Routine: Stress and anxiety can exacerbate nocturia. Engage in calming activities before bed, such as reading, listening to soothing music, or practicing relaxation techniques.  Stay Active: Engage in regular physical activity, but avoid intense exercise close to bedtime, as this can increase your body's demand for fluids.  Maintain a Healthy Weight: Excess weight can compress the bladder and contribute to bladder and urinary issues. Aim to achieve and maintain a healthy weight through a balanced diet and regular exercise.  Remember that every individual is unique, and the effectiveness of these strategies may vary. It's important to work with your healthcare provider to develop a plan that suits your specific needs and addresses any underlying causes of nocturia.

## 2022-09-21 NOTE — Progress Notes (Signed)
   09/21/2022 2:17 PM   Stuart Harvey [birth year] [id number]  Reason for visit: Follow up PSA screening, BPH/OAB, polycystic left kidney, ED  HPI: He is a very comorbid 71 year old male with a extensive history of vascular disease, and remains on Eliquis.  He has a known polycystic left kidney that has been followed for over 5 years with MRI and ultrasound, with no concerning features.  He is asymptomatic with no urinary tract infections.  Regarding his BPH, he was previously on tamsulosin, but discontinued this medication with minimal change in his urinary symptoms.  His primary issue is urgency and frequency, likely secondary to high volume of diet sodas, tea, and coffee.  Behavioral strategies discussed.  PVRs have been normal, including today at 34 mL.  He would like to focus on behavioral strategies prior to starting a medication which I think is very reasonable.  In terms of erectile dysfunction, he and his wife are no longer sexually active, and he is no longer interested in medications for ED.  We previously discontinued PSA screening per the AUA guideline recommendations, most recent PSA was normal at 1.4.  Behavioral strategies discussed regarding urgency/frequency, if he opts to pursue medical management will call back to consider trial of [place], MD  [facility] [address] [place], [place] [address] [phone]

## 2022-09-22 ENCOUNTER — Encounter: Payer: Medicare HMO | Attending: Cardiovascular Disease

## 2022-09-22 VITALS — Ht 71.0 in | Wt 216.2 lb

## 2022-09-22 DIAGNOSIS — I5022 Chronic systolic (congestive) heart failure: Secondary | ICD-10-CM | POA: Insufficient documentation

## 2022-09-22 NOTE — Progress Notes (Signed)
Pulmonary Individual Treatment Plan  Patient Details  Name: Stuart Harvey MRN: [id number] Date of Birth: 01-Oct-1951 Referring Provider:   Flowsheet Row Pulmonary Rehab from 09/22/2022 in [facility] Cardiac and Pulmonary Rehab  Referring Provider [name], MD       Initial Encounter Date:  Flowsheet Row Pulmonary Rehab from 09/22/2022 in [facility] Cardiac and Pulmonary Rehab  Date 09/22/22       Visit Diagnosis: Heart failure, chronic systolic ([place])  Patient's Home Medications on Admission:  Current Outpatient Medications:    apixaban (ELIQUIS) 5 MG TABS tablet, Take 1 tablet (5 mg total) by mouth 2 (two) times daily., Disp: 180 tablet, Rfl: 1   buPROPion (WELLBUTRIN SR) 200 MG 12 hr tablet, Take 1 tablet (200 mg total) by mouth 2 (two) times daily., Disp: 180 tablet, Rfl: 0   Cholecalciferol 50 MCG (2000 UT) CAPS, Take 1 capsule by mouth daily. , Disp: , Rfl:    citalopram (CELEXA) 40 MG tablet, Take 1 tablet (40 mg total) by mouth daily., Disp: 90 tablet, Rfl: 1   dapagliflozin propanediol (FARXIGA) 10 MG TABS tablet, Take 1 tablet (10 mg total) by mouth daily before breakfast., Disp: 30 tablet, Rfl: 11   ezetimibe (ZETIA) 10 MG tablet, Take 1 tablet (10 mg total) by mouth daily., Disp: 90 tablet, Rfl: 1   levothyroxine (SYNTHROID) 75 MCG tablet, Take 1 tablet (75 mcg total) by mouth daily., Disp: 90 tablet, Rfl: 0   metoprolol succinate (TOPROL XL) 25 MG 24 hr tablet, Take 1 tablet (25 mg total) by mouth daily., Disp: 90 tablet, Rfl: 3   omeprazole (PRILOSEC) 20 MG capsule, Take 1 capsule (20 mg total) by mouth daily., Disp: 90 capsule, Rfl: 0   rosuvastatin (CRESTOR) 40 MG tablet, Take 1 tablet (40 mg total) by mouth daily., Disp: 90 tablet, Rfl: 2  Past Medical History: Past Medical History:  Diagnosis Date   AAA (abdominal aortic aneurysm) ([place])    a.) s/p EVAR in 08/2007 by Dr. Hulda Humphrey. b.) CTA on 06/30/2021 --> interval aneurysmal dilitation superior to stent graft with the  juxtarenal aorta measuring 3.8 cm.   Anemia    Arthritis    both knees   Chronic anticoagulation    Apixaban   CKD (chronic kidney disease), stage II    Coronary artery disease    a.) 4v CABG @ [facility] 06/2007 (LIMA-LAD, VG-RPDA, VG-OM3,VG-D2); b.) LHC 2011: patent grafts EF 40%, c.) LHC 06/05/18: LM nl, pLAD 50%, p-mLAD 50%, ostD1 70%, ost ramus 30%, ost-pLCx 40%, pLCx 100% (chronically occluded), OM3 100%, p-dRCA 100% (chronically occluded), LIMA-LAD patent, VG-RPDA patent, VG-OM3 mid-graft 60% and 80%   Depression    GERD (gastroesophageal reflux disease)    Hernia of abdominal cavity    History of 2019 novel coronavirus disease (COVID-19) 09/2019   Hyperlipidemia    Hypertension    Hypothyroidism    Iliac artery aneurysm, right ([place])    a.) at the distal landing zone of the right iliac limb measuring 3 cm   Right middle lobe pulmonary nodule 06/30/2021   a.) measured 8 mm by CT on 06/30/2021.   S/P CABG x 4 06/25/2007   a.) LIMA-LAD, SVG-RPDA, SVG-OM3, SVG-D2   Sigmoid diverticulosis    Ventricular tachycardia ([place]) 05/2018   a.) found unresponsive by EMS; VT noted --> defibrillation achieved ROSC. Briefly intubated. Episode felt to be secondary to heat stoke.    Tobacco Use: Social History   Tobacco Use  Smoking Status Former   Packs/day: 1.00  Years: 35.00   Total pack years: 35.00   Types: Cigarettes   Quit date: 12/16/2005   Years since quitting: 16.7   Passive exposure: Past  Smokeless Tobacco Current   Types: Chew  Tobacco Comments   pt states he very rarely uses smokeless tobacco     Labs: Review Flowsheet  More data exists      Latest Ref Rng & Units 07/03/2020 01/05/2021 02/18/2021 09/22/2021 03/29/2022  Labs for ITP Cardiac and Pulmonary Rehab  Cholestrol 100 - 199 mg/dL 143  171  127  145  142   LDL (calc) 0 - 99 mg/dL 77  104  58  77  74   HDL-C >39 mg/dL 48  52  45  47  49   Trlycerides 0 - 149 mg/dL 97  83  138  114  105   Hemoglobin A1c 4.8 - 5.6 % - 5.7   - - -     Pulmonary Assessment Scores:  Pulmonary Assessment Scores     Row Name 09/22/22 1338         ADL UCSD   ADL Phase Entry       mMRC Score   mMRC Score 1              UCSD: Self-administered rating of dyspnea associated with activities of daily living (ADLs) 6-point scale (0 = "not at all" to 5 = "maximal or unable to do because of breathlessness")  Scoring Scores range from 0 to 120.  Minimally important difference is 5 units  CAT: CAT can identify the health impairment of COPD patients and is better correlated with disease progression.  CAT has a scoring range of zero to 40. The CAT score is classified into four groups of low (less than 10), medium (10 - 20), high (21-30) and very high (31-40) based on the impact level of disease on health status. A CAT score over 10 suggests significant symptoms.  A worsening CAT score could be explained by an exacerbation, poor medication adherence, poor inhaler technique, or progression of COPD or comorbid conditions.  CAT MCID is 2 points  mMRC: mMRC (Modified Medical Research Council) Dyspnea Scale is used to assess the degree of baseline functional disability in patients of respiratory disease due to dyspnea. No minimal important difference is established. A decrease in score of 1 point or greater is considered a positive change.   Pulmonary Function Assessment:  Pulmonary Function Assessment - 09/12/22 1113       Breath   Shortness of Breath Yes;Limiting activity             Exercise Target Goals: Exercise Program Goal: Individual exercise prescription set using results from initial 6 min walk test and THRR while considering  patient's activity barriers and safety.   Exercise Prescription Goal: Initial exercise prescription builds to 30-45 minutes a day of aerobic activity, 2-3 days per week.  Home exercise guidelines will be given to patient during program as part of exercise prescription that the participant  will acknowledge.  Education: Aerobic Exercise: - Group verbal and visual presentation on the components of exercise prescription. Introduces F.I.T.T principle from ACSM for exercise prescriptions.  Reviews F.I.T.T. principles of aerobic exercise including progression. Written material given at graduation. Flowsheet Row Pulmonary Rehab from 09/22/2022 in [facility] Cardiac and Pulmonary Rehab  Education need identified 09/22/22       Education: Resistance Exercise: - Group verbal and visual presentation on the components of exercise prescription. Introduces F.I.T.T principle from  ACSM for exercise prescriptions  Reviews F.I.T.T. principles of resistance exercise including progression. Written material given at graduation.    Education: Exercise & Equipment Safety: - Individual verbal instruction and demonstration of equipment use and safety with use of the equipment. Flowsheet Row Pulmonary Rehab from 09/22/2022 in [facility]  Cardiac and Pulmonary Rehab  Date 09/12/22  Educator [facility]  Instruction Review Code 1- Verbalizes Understanding       Education: Exercise Physiology & General Exercise Guidelines: - Group verbal and written instruction with models to review the exercise physiology of the cardiovascular system and associated critical values. Provides general exercise guidelines with specific guidelines to those with heart or lung disease.    Education: Flexibility, Balance, Mind/Body Relaxation: - Group verbal and visual presentation with interactive activity on the components of exercise prescription. Introduces F.I.T.T principle from ACSM for exercise prescriptions. Reviews F.I.T.T. principles of flexibility and balance exercise training including progression. Also discusses the mind body connection.  Reviews various relaxation techniques to help reduce and manage stress (i.e. Deep breathing, progressive muscle relaxation, and visualization). Balance handout provided to take home. Written material  given at graduation.   Activity Barriers & Risk Stratification:  Activity Barriers & Cardiac Risk Stratification - 09/22/22 1327       Activity Barriers & Cardiac Risk Stratification   Activity Barriers Left Knee Replacement;Right Hip Replacement;Other (comment);Shortness of Breath    Comments Hip Pain from Aneurysm    Cardiac Risk Stratification High             6 Minute Walk:  Oxygen Initial Assessment:  Oxygen Initial Assessment - 09/12/22 1112       Home Oxygen   Home Oxygen Device None    Sleep Oxygen Prescription None    Home Exercise Oxygen Prescription None    Home Resting Oxygen Prescription None      Initial 6 min Walk   Oxygen Used None      Program Oxygen Prescription   Program Oxygen Prescription None      Intervention   Short Term Goals To learn and understand importance of monitoring SPO2 with pulse oximeter and demonstrate accurate use of the pulse oximeter.;To learn and understand importance of maintaining oxygen saturations>88%;To learn and demonstrate proper pursed lip breathing techniques or other breathing techniques. ;To learn and exhibit compliance with exercise, home and travel O2 prescription    Long  Term Goals Verbalizes importance of monitoring SPO2 with pulse oximeter and return demonstration;Maintenance of O2 saturations>88%;Exhibits proper breathing techniques, such as pursed lip breathing or other method taught during program session;Compliance with respiratory medication;Exhibits compliance with exercise, home  and travel O2 prescription             Oxygen Re-Evaluation:   Oxygen Discharge (Final Oxygen Re-Evaluation):   Initial Exercise Prescription:  Initial Exercise Prescription - 09/22/22 1300       Date of Initial Exercise RX and Referring Provider   Date 09/22/22    Referring Provider [name], MD      Oxygen   Maintain Oxygen Saturation 88% or higher      Treadmill   MPH 2    Grade 0.5    Minutes 15     METs 2.67      Recumbant Bike   Level 1    RPM 50    Watts 12    Minutes 15    METs 2.38      REL-XR   Level 1    Watts 12    Speed  50    Minutes 15    METs 2.38      Prescription Details   Frequency (times per week) 2    Duration Progress to 30 minutes of continuous aerobic without signs/symptoms of physical distress      Intensity   THRR 40-80% of Max Heartrate 99-134    Ratings of Perceived Exertion 11-13    Perceived Dyspnea 0-4      Progression   Progression Continue to progress workloads to maintain intensity without signs/symptoms of physical distress.      Resistance Training   Training Prescription Yes    Weight 4 lb    Reps 10-15             Perform Capillary Blood Glucose checks as needed.  Exercise Prescription Changes:   Exercise Prescription Changes     Row Name 09/22/22 1300             Response to Exercise   Blood Pressure (Admit) 102/60       Blood Pressure (Exercise) 118/76       Blood Pressure (Exit) 108/68       Heart Rate (Admit) 66 bpm       Heart Rate (Exercise) 88 bpm       Heart Rate (Exit) 69 bpm       Oxygen Saturation (Admit) 96 %       Oxygen Saturation (Exercise) 94 %       Oxygen Saturation (Exit) 97 %       Rating of Perceived Exertion (Exercise) 11       Perceived Dyspnea (Exercise) 1       Symptoms Hip Pain 4/10       Comments 6MWT Results                Exercise Comments:   Exercise Goals and Review:   Exercise Goals     Row Name 09/22/22 1334             Exercise Goals   Increase Physical Activity Yes       Intervention Provide advice, education, support and counseling about physical activity/exercise needs.;Develop an individualized exercise prescription for aerobic and resistive training based on initial evaluation findings, risk stratification, comorbidities and participant's personal goals.       Expected Outcomes Short Term: Attend rehab on a regular basis to increase amount of physical  activity.;Long Term: Add in home exercise to make exercise part of routine and to increase amount of physical activity.;Long Term: Exercising regularly at least 3-5 days a week.       Increase Strength and Stamina Yes       Intervention Provide advice, education, support and counseling about physical activity/exercise needs.;Develop an individualized exercise prescription for aerobic and resistive training based on initial evaluation findings, risk stratification, comorbidities and participant's personal goals.       Expected Outcomes Short Term: Increase workloads from initial exercise prescription for resistance, speed, and METs.;Short Term: Perform resistance training exercises routinely during rehab and add in resistance training at home;Long Term: Improve cardiorespiratory fitness, muscular endurance and strength as measured by increased METs and functional capacity (6MWT)       Able to understand and use rate of perceived exertion (RPE) scale Yes       Intervention Provide education and explanation on how to use RPE scale       Expected Outcomes Short Term: Able to use RPE daily in rehab to express subjective intensity level;Long Term:  Able to use RPE to guide intensity level when exercising independently       Able to understand and use Dyspnea scale Yes       Intervention Provide education and explanation on how to use Dyspnea scale       Expected Outcomes Short Term: Able to use Dyspnea scale daily in rehab to express subjective sense of shortness of breath during exertion;Long Term: Able to use Dyspnea scale to guide intensity level when exercising independently       Knowledge and understanding of Target Heart Rate Range (THRR) Yes       Intervention Provide education and explanation of THRR including how the numbers were predicted and where they are located for reference       Expected Outcomes Long Term: Able to use THRR to govern intensity when exercising independently;Short Term: Able to  state/look up THRR;Short Term: Able to use daily as guideline for intensity in rehab       Able to check pulse independently Yes       Intervention Provide education and demonstration on how to check pulse in carotid and radial arteries.;Review the importance of being able to check your own pulse for safety during independent exercise       Expected Outcomes Short Term: Able to explain why pulse checking is important during independent exercise;Long Term: Able to check pulse independently and accurately       Understanding of Exercise Prescription Yes       Intervention Provide education, explanation, and written materials on patient's individual exercise prescription       Expected Outcomes Short Term: Able to explain program exercise prescription;Long Term: Able to explain home exercise prescription to exercise independently                Exercise Goals Re-Evaluation :   Discharge Exercise Prescription (Final Exercise Prescription Changes):  Exercise Prescription Changes - 09/22/22 1300       Response to Exercise   Blood Pressure (Admit) 102/60    Blood Pressure (Exercise) 118/76    Blood Pressure (Exit) 108/68    Heart Rate (Admit) 66 bpm    Heart Rate (Exercise) 88 bpm    Heart Rate (Exit) 69 bpm    Oxygen Saturation (Admit) 96 %    Oxygen Saturation (Exercise) 94 %    Oxygen Saturation (Exit) 97 %    Rating of Perceived Exertion (Exercise) 11    Perceived Dyspnea (Exercise) 1    Symptoms Hip Pain 4/10    Comments 6MWT Results             Nutrition:  Target Goals: Understanding of nutrition guidelines, daily intake of sodium '1500mg'$ , cholesterol '200mg'$ , calories 30% from fat and 7% or less from saturated fats, daily to have 5 or more servings of fruits and vegetables.  Education: All About Nutrition: -Group instruction provided by verbal, written material, interactive activities, discussions, models, and posters to present general guidelines for heart healthy  nutrition including fat, fiber, MyPlate, the role of sodium in heart healthy nutrition, utilization of the nutrition label, and utilization of this knowledge for meal planning. Follow up email sent as well. Written material given at graduation. Flowsheet Row Pulmonary Rehab from 09/22/2022 in [facility] Cardiac and Pulmonary Rehab  Education need identified 09/22/22       Biometrics:  Pre Biometrics - 09/22/22 1334       Pre Biometrics   Height '5\' 11"'$  (1.803 m)    Weight 216 lb 3.2  oz (98.1 kg)    BMI (Calculated) 30.17    Single Leg Stand 5 seconds   R             Nutrition Therapy Plan and Nutrition Goals:  Nutrition Therapy & Goals - 09/22/22 1049       Nutrition Therapy   Diet Heart healthy, low Na, CKD stg 3 MNT    Drug/Food Interactions Statins/Certain Fruits    Protein (specify units) 85-95g    Fiber 30 grams    Whole Grain Foods 3 servings   Depending on labwork   Saturated Fats 16 max. grams    Fruits and Vegetables 8 servings/day    Sodium 1.5 grams      Personal Nutrition Goals   Nutrition Goal ST: practice MyPlate guidelines, focus on variety of plant foods LT: limit saturated fat <16g/day, limit sodium intake <1.5g/day, eat at least 30g of fiber per day, alter potassium and phosphorus intake based of of labwork for CKD.    Comments 71 y.o. M admitted to pulmonary rehab for chronic HF. PMHx includes CAD, MI 2008, HLD, HTN, CKD stg 3, AAA s/p EVAR (2008), paroxysmal A-fib, HFrEF, hypothyroidism, current tobacco use (dip), arthritis, depression, GERD, diverticulosis. PSHx includes CABG 2008, EVAR 2008 and 2014, coronary/graft angiography 2019, endovascular/stent graft 2022, bilateral TKA 2017. Relevant medications includes bupropion, vit D3, citalopram, synthroid, melatonin, omeprazole, rosuvastatin. Last GFR 08/29/22 was 42. PYP Score: 56. Vegetables & Fruits 5/12. Breads, Grains & Cereals 5/12. Red & Processed Meat 7/12. Poultry 2/2. Fish & Shellfish 0/4. Beans, Nuts &  Seeds 3/4. Milk & Dairy Foods 3/6. Toppings, Oils, Seasonings & Salt 17/20. Sweets, Snacks & Restaurant Food 7/14. Beverages 7/10. He reports watching sodium B: does not usually have breakfast L: low sodium ham/turkey on sandwich (white bread) D: pork chops, chicken, spaghetti, roast beef. he likes to have baked potatoes and white beans. He reports having red meat <2x/week. He rpeorts his wife will cook dinner. He reports his wife uses liquid plant oils. Drinks: 2 cups of coffee (2 sugars), water, sometimes sweet tea or pepsi (1-2x/day). He reports having salad every meal (cucumbers, lettuce, tomato, some cheese, [place] (2 tbsp). He reports that his wife is a great support system for him and makes sure to watch what he eats. Discussed heart healthy eating and CKD stg 3 MNT.      Intervention Plan   Intervention Prescribe, educate and counsel regarding individualized specific dietary modifications aiming towards targeted core components such as weight, hypertension, lipid management, diabetes, heart failure and other comorbidities.;Nutrition handout(s) given to patient.    Expected Outcomes Short Term Goal: Understand basic principles of dietary content, such as calories, fat, sodium, cholesterol and nutrients.;Short Term Goal: A plan has been developed with personal nutrition goals set during dietitian appointment.;Long Term Goal: Adherence to prescribed nutrition plan.             Nutrition Assessments:  MEDIFICTS Score Key: ?70 Need to make dietary changes  40-70 Heart Healthy Diet ? 40 Therapeutic Level Cholesterol Diet  Flowsheet Row Pulmonary Rehab from 09/22/2022 in [facility] Cardiac and Pulmonary Rehab  Picture Your Plate Total Score on Admission 56      Picture Your Plate Scores: <15 Unhealthy dietary pattern with much room for improvement. 41-50 Dietary pattern unlikely to meet recommendations for good health and room for improvement. 51-60 More healthful dietary pattern, with  some room for improvement.  >60 Healthy dietary pattern, although there may be some specific behaviors that  could be improved.   Nutrition Goals Re-Evaluation:   Nutrition Goals Discharge (Final Nutrition Goals Re-Evaluation):   Psychosocial: Target Goals: Acknowledge presence or absence of significant depression and/or stress, maximize coping skills, provide positive support system. Participant is able to verbalize types and ability to use techniques and skills needed for reducing stress and depression.   Education: Stress, Anxiety, and Depression - Group verbal and visual presentation to define topics covered.  Reviews how body is impacted by stress, anxiety, and depression.  Also discusses healthy ways to reduce stress and to treat/manage anxiety and depression.  Written material given at graduation.   Education: Sleep Hygiene -Provides group verbal and written instruction about how sleep can affect your health.  Define sleep hygiene, discuss sleep cycles and impact of sleep habits. Review good sleep hygiene tips.    Initial Review & Psychosocial Screening:  Initial Psych Review & Screening - 09/12/22 1114       Initial Review   Current issues with Current Psychotropic Meds;History of Depression      Family Dynamics   Good Support System? Yes    Comments Stuart Harvey can look to his wife for suppprt. He feels concerned for his health since he also has a kidney disease.      Barriers   Psychosocial barriers to participate in program The patient should benefit from training in stress management and relaxation.      Screening Interventions   Interventions Encouraged to exercise;To provide support and resources with identified psychosocial needs;Provide feedback about the scores to participant    Expected Outcomes Short Term goal: Utilizing psychosocial counselor, staff and physician to assist with identification of specific Stressors or current issues interfering with healing process.  Setting desired goal for each stressor or current issue identified.;Long Term Goal: Stressors or current issues are controlled or eliminated.;Short Term goal: Identification and review with participant of any Quality of Life or Depression concerns found by scoring the questionnaire.;Long Term goal: The participant improves quality of Life and PHQ9 Scores as seen by post scores and/or verbalization of changes             Quality of Life Scores:  Quality of Life - 09/22/22 1335       Quality of Life   Select Quality of Life      Quality of Life Scores   Health/Function Pre 20.27 %    Socioeconomic Pre 22.17 %    Psych/Spiritual Pre 22.93 %    Family Pre 28.5 %    GLOBAL Pre 22.42 %            Scores of 19 and below usually indicate a poorer quality of life in these areas.  A difference of  2-3 points is a clinically meaningful difference.  A difference of 2-3 points in the total score of the Quality of Life Index has been associated with significant improvement in overall quality of life, self-image, physical symptoms, and general health in studies assessing change in quality of life.  PHQ-9: Review Flowsheet  More data exists      09/22/2022 08/09/2022 09/29/2021 08/06/2021 07/30/2021  Depression screen PHQ 2/9  Decreased Interest 0 0 0 0 0  Down, Depressed, Hopeless 0 0 0 0 0  PHQ - 2 Score 0 0 0 0 0  Altered sleeping 1 0 0 - 1  Tired, decreased energy '1 1 1 '$ - 1  Change in appetite 0 1 0 - 1  Feeling bad or failure about yourself  0 0  0 - 0  Trouble concentrating 1 0 0 - 0  Moving slowly or fidgety/restless 0 0 0 - 0  Suicidal thoughts 0 0 0 - 0  PHQ-9 Score '3 2 1 '$ - 3  Difficult doing work/chores Somewhat difficult Not difficult at all Not difficult at all - Not difficult at all   Interpretation of Total Score  Total Score Depression Severity:  1-4 = Minimal depression, 5-9 = Mild depression, 10-14 = Moderate depression, 15-19 = Moderately severe depression, 20-27 =  Severe depression   Psychosocial Evaluation and Intervention:  Psychosocial Evaluation - 09/12/22 1115       Psychosocial Evaluation & Interventions   Interventions Encouraged to exercise with the program and follow exercise prescription;Relaxation education;Stress management education    Comments Stuart Harvey can look to his wife for suppprt. He feels concerned for his health since he also has a kidney disease.    Expected Outcomes Short: Start [facility] to help with mood. Long: Maintain a healthy mental state    Continue Psychosocial Services  Follow up required by staff             Psychosocial Re-Evaluation:   Psychosocial Discharge (Final Psychosocial Re-Evaluation):   Education: Education Goals: Education classes will be provided on a weekly basis, covering required topics. Participant will state understanding/return demonstration of topics presented.  Learning Barriers/Preferences:  Learning Barriers/Preferences - 09/12/22 1113       Learning Barriers/Preferences   Learning Barriers None    Learning Preferences None             General Pulmonary Education Topics:  Infection Prevention: - Provides verbal and written material to individual with discussion of infection control including proper hand washing and proper equipment cleaning during exercise session. Flowsheet Row Pulmonary Rehab from 09/22/2022 in [facility] Cardiac and Pulmonary Rehab  Date 09/12/22  Educator [facility]  Instruction Review Code 1- Verbalizes Understanding       Falls Prevention: - Provides verbal and written material to individual with discussion of falls prevention and safety. Flowsheet Row Pulmonary Rehab from 09/22/2022 in [facility] Cardiac and Pulmonary Rehab  Date 09/12/22  Educator [facility]  Instruction Review Code 1- Verbalizes Understanding       Chronic Lung Disease Review: - Group verbal instruction with posters, models, PowerPoint presentations and videos,  to review new updates, new  respiratory medications, new advancements in procedures and treatments. Providing information on websites and "800" numbers for continued self-education. Includes information about supplement oxygen, available portable oxygen systems, continuous and intermittent flow rates, oxygen safety, concentrators, and Medicare reimbursement for oxygen. Explanation of Pulmonary Drugs, including class, frequency, complications, importance of spacers, rinsing mouth after steroid MDI's, and proper cleaning methods for nebulizers. Review of basic lung anatomy and physiology related to function, structure, and complications of lung disease. Review of risk factors. Discussion about methods for diagnosing sleep apnea and types of masks and machines for OSA. Includes a review of the use of types of environmental controls: home humidity, furnaces, filters, dust mite/pet prevention, HEPA vacuums. Discussion about weather changes, air quality and the benefits of nasal washing. Instruction on Warning signs, infection symptoms, calling MD promptly, preventive modes, and value of vaccinations. Review of effective airway clearance, coughing and/or vibration techniques. Emphasizing that all should Create an Action Plan. Written material given at graduation.   AED/CPR: - Group verbal and written instruction with the use of models to demonstrate the basic use of the AED with the basic ABC's of resuscitation.    Anatomy and  Cardiac Procedures: - Group verbal and visual presentation and models provide information about basic cardiac anatomy and function. Reviews the testing methods done to diagnose heart disease and the outcomes of the test results. Describes the treatment choices: Medical Management, Angioplasty, or Coronary Bypass Surgery for treating various heart conditions including Myocardial Infarction, Angina, Valve Disease, and Cardiac Arrhythmias.  Written material given at graduation. Flowsheet Row Pulmonary Rehab from  09/22/2022 in [facility] Cardiac and Pulmonary Rehab  Education need identified 09/22/22       Medication Safety: - Group verbal and visual instruction to review commonly prescribed medications for heart and lung disease. Reviews the medication, class of the drug, and side effects. Includes the steps to properly store meds and maintain the prescription regimen.  Written material given at graduation.   Other: -Provides group and verbal instruction on various topics (see comments)   Knowledge Questionnaire Score:  Knowledge Questionnaire Score - 09/22/22 1336       Knowledge Questionnaire Score   Pre Score 22/26              Core Components/Risk Factors/Patient Goals at Admission:  Personal Goals and Risk Factors at Admission - 09/22/22 1336       Core Components/Risk Factors/Patient Goals on Admission    Weight Management Yes;Weight Maintenance    Intervention Weight Management: Develop a combined nutrition and exercise program designed to reach desired caloric intake, while maintaining appropriate intake of nutrient and fiber, sodium and fats, and appropriate energy expenditure required for the weight goal.;Weight Management: Provide education and appropriate resources to help participant work on and attain dietary goals.;Weight Management/Obesity: Establish reasonable short term and long term weight goals.    Admit Weight 216 lb 3.2 oz (98.1 kg)    Goal Weight: Short Term 210 lb (95.3 kg)    Goal Weight: Long Term 200 lb (90.7 kg)    Expected Outcomes Weight Maintenance: Understanding of the daily nutrition guidelines, which includes 25-35% calories from fat, 7% or less cal from saturated fats, less than '200mg'$  cholesterol, less than 1.5gm of sodium, & 5 or more servings of fruits and vegetables daily;Long Term: Adherence to nutrition and physical activity/exercise program aimed toward attainment of established weight goal;Short Term: Continue to assess and modify interventions until  short term weight is achieved;Understanding recommendations for meals to include 15-35% energy as protein, 25-35% energy from fat, 35-60% energy from carbohydrates, less than '200mg'$  of dietary cholesterol, 20-35 gm of total fiber daily;Understanding of distribution of calorie intake throughout the day with the consumption of 4-5 meals/snacks    Improve shortness of breath with ADL's Yes    Intervention Provide education, individualized exercise plan and daily activity instruction to help decrease symptoms of SOB with activities of daily living.    Expected Outcomes Short Term: Improve cardiorespiratory fitness to achieve a reduction of symptoms when performing ADLs;Long Term: Be able to perform more ADLs without symptoms or delay the onset of symptoms    Hypertension Yes    Intervention Provide education on lifestyle modifcations including regular physical activity/exercise, weight management, moderate sodium restriction and increased consumption of fresh fruit, vegetables, and low fat dairy, alcohol moderation, and smoking cessation.;Monitor prescription use compliance.    Expected Outcomes Short Term: Continued assessment and intervention until BP is < 140/86m HG in hypertensive participants. < 130/842mHG in hypertensive participants with diabetes, heart failure or chronic kidney disease.;Long Term: Maintenance of blood pressure at goal levels.    Lipids Yes    Intervention Provide education  and support for participant on nutrition & aerobic/resistive exercise along with prescribed medications to achieve LDL '70mg'$ , HDL >'40mg'$ .    Expected Outcomes Short Term: Participant states understanding of desired cholesterol values and is compliant with medications prescribed. Participant is following exercise prescription and nutrition guidelines.;Long Term: Cholesterol controlled with medications as prescribed, with individualized exercise RX and with personalized nutrition plan. Value goals: LDL < '70mg'$ , HDL > 40  mg.             Education:Diabetes - Individual verbal and written instruction to review signs/symptoms of diabetes, desired ranges of glucose level fasting, after meals and with exercise. Acknowledge that pre and post exercise glucose checks will be done for 3 sessions at entry of program.   Know Your Numbers and Heart Failure: - Group verbal and visual instruction to discuss disease risk factors for cardiac and pulmonary disease and treatment options.  Reviews associated critical values for Overweight/Obesity, Hypertension, Cholesterol, and Diabetes.  Discusses basics of heart failure: signs/symptoms and treatments.  Introduces Heart Failure Zone chart for action plan for heart failure.  Written material given at graduation.   Core Components/Risk Factors/Patient Goals Review:    Core Components/Risk Factors/Patient Goals at Discharge (Final Review):    ITP Comments:  ITP Comments     Row Name 09/12/22 1111 09/22/22 1105         ITP Comments Virtual Visit completed. Patient informed on EP and RD appointment and 6 Minute walk test. Patient also informed of patient health questionnaires on My Chart. Patient Verbalizes understanding. Visit diagnosis can be found in [facility] 08/26/2022. Completed 6MWT and gym orientation. Initial ITP created and sent for review to Dr. Emily Filbert, Medical Director.               Comments: Initial ITP

## 2022-09-22 NOTE — Patient Instructions (Signed)
Patient Instructions  Patient Details  Name: Stuart Harvey MRN: [id number] Date of Birth: [birth year] Referring Provider:  Wellington Hampshire, MD  Below are your personal goals for exercise, nutrition, and risk factors. Our goal is to help you stay on track towards obtaining and maintaining these goals. We will be discussing your progress on these goals with you throughout the program.  Initial Exercise Prescription:  Initial Exercise Prescription - 09/22/22 1300       Date of Initial Exercise RX and Referring Provider   Date 09/22/22    Referring Provider [name], MD      Oxygen   Maintain Oxygen Saturation 88% or higher      Treadmill   MPH 2    Grade 0.5    Minutes 15    METs 2.67      Recumbant Bike   Level 1    RPM 50    Watts 12    Minutes 15    METs 2.38      REL-XR   Level 1    Watts 12    Speed 50    Minutes 15    METs 2.38      Prescription Details   Frequency (times per week) 2    Duration Progress to 30 minutes of continuous aerobic without signs/symptoms of physical distress      Intensity   THRR 40-80% of Max Heartrate 99-134    Ratings of Perceived Exertion 11-13    Perceived Dyspnea 0-4      Progression   Progression Continue to progress workloads to maintain intensity without signs/symptoms of physical distress.      Resistance Training   Training Prescription Yes    Weight 4 lb    Reps 10-15             Exercise Goals: Frequency: Be able to perform aerobic exercise two to three times per week in program working toward 2-5 days per week of home exercise.  Intensity: Work with a perceived exertion of 11 (fairly light) - 15 (hard) while following your exercise prescription.  We will make changes to your prescription with you as you progress through the program.   Duration: Be able to do 30 to 45 minutes of continuous aerobic exercise in addition to a 5 minute warm-up and a 5 minute cool-down routine.   Nutrition Goals: Your  personal nutrition goals will be established when you do your nutrition analysis with the dietician.  The following are general nutrition guidelines to follow: Cholesterol < '200mg'$ /day Sodium < '1500mg'$ /day Fiber: Men over 50 yrs - 30 grams per day  Personal Goals:  Personal Goals and Risk Factors at Admission - 09/22/22 1336       Core Components/Risk Factors/Patient Goals on Admission    Weight Management Yes;Weight Maintenance    Intervention Weight Management: Develop a combined nutrition and exercise program designed to reach desired caloric intake, while maintaining appropriate intake of nutrient and fiber, sodium and fats, and appropriate energy expenditure required for the weight goal.;Weight Management: Provide education and appropriate resources to help participant work on and attain dietary goals.;Weight Management/Obesity: Establish reasonable short term and long term weight goals.    Admit Weight 216 lb 3.2 oz (98.1 kg)    Goal Weight: Short Term 210 lb (95.3 kg)    Goal Weight: Long Term 200 lb (90.7 kg)    Expected Outcomes Weight Maintenance: Understanding of the daily nutrition guidelines, which includes 25-35% calories from fat, 7%  or less cal from saturated fats, less than '200mg'$  cholesterol, less than 1.5gm of sodium, & 5 or more servings of fruits and vegetables daily;Long Term: Adherence to nutrition and physical activity/exercise program aimed toward attainment of established weight goal;Short Term: Continue to assess and modify interventions until short term weight is achieved;Understanding recommendations for meals to include 15-35% energy as protein, 25-35% energy from fat, 35-60% energy from carbohydrates, less than '200mg'$  of dietary cholesterol, 20-35 gm of total fiber daily;Understanding of distribution of calorie intake throughout the day with the consumption of 4-5 meals/snacks    Improve shortness of breath with ADL's Yes    Intervention Provide education, individualized  exercise plan and daily activity instruction to help decrease symptoms of SOB with activities of daily living.    Expected Outcomes Short Term: Improve cardiorespiratory fitness to achieve a reduction of symptoms when performing ADLs;Long Term: Be able to perform more ADLs without symptoms or delay the onset of symptoms    Hypertension Yes    Intervention Provide education on lifestyle modifcations including regular physical activity/exercise, weight management, moderate sodium restriction and increased consumption of fresh fruit, vegetables, and low fat dairy, alcohol moderation, and smoking cessation.;Monitor prescription use compliance.    Expected Outcomes Short Term: Continued assessment and intervention until BP is < 140/75m HG in hypertensive participants. < 130/864mHG in hypertensive participants with diabetes, heart failure or chronic kidney disease.;Long Term: Maintenance of blood pressure at goal levels.    Lipids Yes    Intervention Provide education and support for participant on nutrition & aerobic/resistive exercise along with prescribed medications to achieve LDL '70mg'$ , HDL >'40mg'$ .    Expected Outcomes Short Term: Participant states understanding of desired cholesterol values and is compliant with medications prescribed. Participant is following exercise prescription and nutrition guidelines.;Long Term: Cholesterol controlled with medications as prescribed, with individualized exercise RX and with personalized nutrition plan. Value goals: LDL < '70mg'$ , HDL > 40 mg.             Tobacco Use Initial Evaluation: Social History   Tobacco Use  Smoking Status Former   Packs/day: 1.00   Years: 35.00   Total pack years: 35.00   Types: Cigarettes   Quit date: 12/16/2005   Years since quitting: 16.7   Passive exposure: Past  Smokeless Tobacco Current   Types: Chew  Tobacco Comments   pt states he very rarely uses smokeless tobacco     Exercise Goals and Review:  Exercise Goals      Row Name 09/22/22 1334             Exercise Goals   Increase Physical Activity Yes       Intervention Provide advice, education, support and counseling about physical activity/exercise needs.;Develop an individualized exercise prescription for aerobic and resistive training based on initial evaluation findings, risk stratification, comorbidities and participant's personal goals.       Expected Outcomes Short Term: Attend rehab on a regular basis to increase amount of physical activity.;Long Term: Add in home exercise to make exercise part of routine and to increase amount of physical activity.;Long Term: Exercising regularly at least 3-5 days a week.       Increase Strength and Stamina Yes       Intervention Provide advice, education, support and counseling about physical activity/exercise needs.;Develop an individualized exercise prescription for aerobic and resistive training based on initial evaluation findings, risk stratification, comorbidities and participant's personal goals.       Expected Outcomes Short Term: Increase  workloads from initial exercise prescription for resistance, speed, and METs.;Short Term: Perform resistance training exercises routinely during rehab and add in resistance training at home;Long Term: Improve cardiorespiratory fitness, muscular endurance and strength as measured by increased METs and functional capacity (6MWT)       Able to understand and use rate of perceived exertion (RPE) scale Yes       Intervention Provide education and explanation on how to use RPE scale       Expected Outcomes Short Term: Able to use RPE daily in rehab to express subjective intensity level;Long Term:  Able to use RPE to guide intensity level when exercising independently       Able to understand and use Dyspnea scale Yes       Intervention Provide education and explanation on how to use Dyspnea scale       Expected Outcomes Short Term: Able to use Dyspnea scale daily in rehab to  express subjective sense of shortness of breath during exertion;Long Term: Able to use Dyspnea scale to guide intensity level when exercising independently       Knowledge and understanding of Target Heart Rate Range (THRR) Yes       Intervention Provide education and explanation of THRR including how the numbers were predicted and where they are located for reference       Expected Outcomes Long Term: Able to use THRR to govern intensity when exercising independently;Short Term: Able to state/look up THRR;Short Term: Able to use daily as guideline for intensity in rehab       Able to check pulse independently Yes       Intervention Provide education and demonstration on how to check pulse in carotid and radial arteries.;Review the importance of being able to check your own pulse for safety during independent exercise       Expected Outcomes Short Term: Able to explain why pulse checking is important during independent exercise;Long Term: Able to check pulse independently and accurately       Understanding of Exercise Prescription Yes       Intervention Provide education, explanation, and written materials on patient's individual exercise prescription       Expected Outcomes Short Term: Able to explain program exercise prescription;Long Term: Able to explain home exercise prescription to exercise independently

## 2022-09-27 ENCOUNTER — Encounter: Payer: Medicare HMO | Admitting: *Deleted

## 2022-09-27 DIAGNOSIS — I5022 Chronic systolic (congestive) heart failure: Secondary | ICD-10-CM | POA: Diagnosis not present

## 2022-09-27 NOTE — Progress Notes (Signed)
Daily Session Note  Patient Details  Name: Stuart Harvey MRN: [id number] Date of Birth: 04-Nov-1951 Referring Provider:   [name] Pulmonary Rehab from 09/22/2022 in [facility] Cardiac and Pulmonary Rehab  Referring Provider [name], MD       Encounter Date: 09/27/2022  Check In:  Session Check In - 09/27/22 1040       Check-In   Supervising physician immediately available to respond to emergencies See telemetry face sheet for immediately available ER MD    Location [facility]-Cardiac & Pulmonary Rehab    Staff Present Heath Lark, RN, BSN, CCRP;Jessica [place]og[place]ka, MA, RCEP, CCRP, Marylynn Pearson, MS, ASCM CEP, Exercise Physiologist    Virtual Visit No    Medication changes reported     No    Fall or balance concerns reported    No    Warm-up and Cool-down Performed on first and last piece of equipment    Resistance Training Performed Yes    VAD Patient? No    PAD/SET Patient? No      Pain Assessment   Currently in Pain? No/denies                Social History   Tobacco Use  Smoking Status Former   Packs/day: 1.00   Years: 35.00   Total pack years: 35.00   Types: Cigarettes   Quit date: 12/16/2005   Years since quitting: 16.7   Passive exposure: Past  Smokeless Tobacco Current   Types: Chew  Tobacco Comments   pt states he very rarely uses smokeless tobacco     Goals Met:  Proper associated with RPD/PD & O2 Sat Exercise tolerated well Personal goals reviewed No report of concerns or symptoms today  Goals Unmet:  Not Applicable  Comments: First full day of exercise!  Patient was oriented to gym and equipment including functions, settings, policies, and procedures.  Patient's individual exercise prescription and treatment plan were reviewed.  All starting workloads were established based on the results of the 6 minute walk test done at initial orientation visit.  The plan for exercise progression was also introduced and progression will be customized  based on patient's performance and goals.    Dr. Emily Filbert is Medical Director for [place].  Dr. Ottie Glazier is Medical Director for [facility] Pulmonary Rehabilitation.

## 2022-09-28 ENCOUNTER — Ambulatory Visit: Payer: Medicare HMO | Admitting: Physician Assistant

## 2022-09-29 ENCOUNTER — Encounter: Payer: Medicare HMO | Admitting: *Deleted

## 2022-09-29 DIAGNOSIS — I5022 Chronic systolic (congestive) heart failure: Secondary | ICD-10-CM

## 2022-09-29 NOTE — Progress Notes (Signed)
Daily Session Note  Patient Details  Name: Stuart Harvey MRN: [id number] Date of Birth: 1951/09/03 Referring Provider:   [name] Pulmonary Rehab from 09/22/2022 in [facility] Cardiac and Pulmonary Rehab  Referring Provider [name], MD       Encounter Date: 09/29/2022  Check In:  Session Check In - 09/29/22 1123       Check-In   Supervising physician immediately available to respond to emergencies See telemetry face sheet for immediately available ER MD    Location [facility]-Cardiac & Pulmonary Rehab    Staff Present Alberteen Sam, MA, RCEP, CCRP, CCET;Joseph Frederica, [place], RN, [place]    Virtual Visit No    Medication changes reported     No    Fall or balance concerns reported    No    Warm-up and Cool-down Performed on first and last piece of equipment    Resistance Training Performed Yes    VAD Patient? No    PAD/SET Patient? No      Pain Assessment   Currently in Pain? No/denies                Social History   Tobacco Use  Smoking Status Former   Packs/day: 1.00   Years: 35.00   Total pack years: 35.00   Types: Cigarettes   Quit date: 12/16/2005   Years since quitting: 16.7   Passive exposure: Past  Smokeless Tobacco Current   Types: Chew  Tobacco Comments   pt states he very rarely uses smokeless tobacco     Goals Met:  Independence with exercise equipment Exercise tolerated well No report of concerns or symptoms today Strength training completed today  Goals Unmet:  Not Applicable  Comments: Pt able to follow exercise prescription today without complaint.  Will continue to monitor for progression.    Dr. Emily Filbert is Medical Director for [place].  Dr. Ottie Glazier is Medical Director for [facility] Pulmonary Rehabilitation.

## 2022-10-04 ENCOUNTER — Encounter: Payer: Medicare HMO | Admitting: *Deleted

## 2022-10-04 ENCOUNTER — Ambulatory Visit: Payer: Medicare HMO | Admitting: Unknown Physician Specialty

## 2022-10-04 DIAGNOSIS — I5022 Chronic systolic (congestive) heart failure: Secondary | ICD-10-CM

## 2022-10-04 NOTE — Progress Notes (Signed)
Daily Session Note  Patient Details  Name: RAIQUAN CHANDLER MRN: [id number] Date of Birth: 1951/04/24 Referring Provider:   Flowsheet Row Pulmonary Rehab from 09/22/2022 in [facility] Cardiac and Pulmonary Rehab  Referring Provider [name], MD       Encounter Date: 10/04/2022  Check In:  Session Check In - 10/04/22 1011       Check-In   Supervising physician immediately available to respond to emergencies See telemetry face sheet for immediately available ER MD    Location [facility]-Cardiac & Pulmonary Rehab    Staff Present Antionette Fairy, BS, Exercise Physiologist;Toshiyuki Fredell Sherryll Burger, RN Margurite Auerbach, MS, ASCM CEP, Exercise Physiologist;Jessica Luan Pulling, MA, RCEP, CCRP, CCET    Virtual Visit No    Medication changes reported     No    Fall or balance concerns reported    No    Warm-up and Cool-down Performed on first and last piece of equipment    Resistance Training Performed Yes    VAD Patient? No    PAD/SET Patient? No      Pain Assessment   Currently in Pain? No/denies                Social History   Tobacco Use  Smoking Status Former   Packs/day: 1.00   Years: 35.00   Total pack years: 35.00   Types: Cigarettes   Quit date: 12/16/2005   Years since quitting: 16.8   Passive exposure: Past  Smokeless Tobacco Current   Types: Chew  Tobacco Comments   pt states he very rarely uses smokeless tobacco     Goals Met:  Independence with exercise equipment Exercise tolerated well No report of concerns or symptoms today Strength training completed today  Goals Unmet:  Not Applicable  Comments: Pt able to follow exercise prescription today without complaint.  Will continue to monitor for progression.    Dr. Emily Filbert is Medical Director for [place].  Dr. Ottie Glazier is Medical Director for [facility] Pulmonary Rehabilitation.

## 2022-10-10 NOTE — Progress Notes (Signed)
Cardiology Office Note    Date:  10/14/2022   ID:  Stuart Harvey, DOB [birth year], MRN [id number]  PCP:  Venita Lick, NP  Cardiologist:  [name], MD  Electrophysiologist:  None   Chief Complaint: Follow up  History of Present Illness:   Stuart NARDOZZI is a 71 y.o. male with history of CAD s/p 4-vessel CABG in 06/2007 at [facility], HFrEF, large infrarenal AAA s/p open surgical repair in 08/2007, right iliac artery aneurysm status post endovascular repair in 07/2021, PAF on Eliquis, AKI, HTN, HLD, hypothyroidism, anemia, depression, and GERD who presents for follow-up of his CAD and A-fib.   He was hospitalized in 05/2018 with unresponsiveness.  He was reportedly down for 1 hour with possible seizure-like activities.  He had numerous contusions.  Per EMS, he was in ventricular tachycardia upon arrival and was cardioverted.  He was briefly intubated and found to have mildly elevated troponin.  CPK was close to 4000.  Echo showed an EF of 50 to 55%.  LHC showed severe underlying three-vessel CAD with patent grafts including LIMA to LAD, SVG to RCA, SVG to diagonal, and SVG to OM3.  There was borderline disease in the SVG to OM, though that was left to be treated medically given it was supplying a small territory.  While admitted, he developed A-fib and converted spontaneously.  His presentation was felt to be triggered by heat stroke.   He was seen in the office in 06/2022 with worsening exertional dyspnea and occasional wheezing as well as some episodes of left-sided chest discomfort.  Subsequent Lexiscan MPI on 06/23/2022 was without evidence of ischemia with prior evidence of infarct as outlined below.  LVEF was moderately reduced with a calculated EF of 38%.  Overall, this study showed findings were consistent with prior MI and was intermediate risk.  Subsequent echo on 07/20/2022 demonstrated an EF of 35 to 40%, global hypokinesis, mild LVH, grade 1 diastolic dysfunction, low normal RV  systolic function, trivial mitral regurgitation, aortic valve sclerosis without evidence of stenosis, trivial aortic insufficiency, mildly dilated aortic root measuring 43 mm, mildly dilated ascending aorta measuring 42 mm, and an estimated right atrial pressure of 3 mmHg.  Given symptoms and the above abnormalities noted on workup, he underwent R/LHC on 08/05/2022 which showed significant three-vessel CAD with patent grafts including LIMA to LAD, SVG to diagonal, and SVG to RPDA.  There was new occlusion of the SVG to OM when compared to most recent cardiac cath.  However, this was not unexpected given the graft was diffusely diseased.  RHC showed normal right and left-sided filling pressures, normal pulmonary pressures, and normal cardiac output.  Medical therapy and GDMT was recommended.  In follow-up on 08/18/2022, he had stable shortness of breath.  He was a started on Farxiga to further optimize GDMT.  With mild renal dysfunction noted on subsequent labs, he was referred to nephrology.  He comes in accompanied by his wife today and is without symptoms of angina or decompensation.  He continues to note chronic stable exertional dyspnea and fatigue.  No lower extremity swelling or progressive orthopnea.  No falls or symptoms concerning for bleeding.  No dizziness, presyncope, or syncope.   Labs independently reviewed: 09/2022 - BUN 23, serum creatinine 1.61, potassium 4.6, TC 146, TG 112, HDL 46, LDL 80, Hgb 14.8, PLT 156 08/2022 - potassium 4.8, BUN 31, serum creatinine 1.71 03/2022 - albumin 4.2, AST/ALT normal, TSH normal 12/2020 - A1c 5.7  Past Medical  History:  Diagnosis Date   AAA (abdominal aortic aneurysm) ([place])    a.) s/p EVAR in 08/2007 by Dr. Hulda Humphrey. b.) CTA on 06/30/2021 --> interval aneurysmal dilitation superior to stent graft with the juxtarenal aorta measuring 3.8 cm.   Anemia    Arthritis    both knees   Chronic anticoagulation    Apixaban   CKD (chronic kidney disease), stage II     Coronary artery disease    a.) 4v CABG @ [place] 06/2007 (LIMA-LAD, VG-RPDA, VG-OM3,VG-D2); b.) LHC 2011: patent grafts EF 40%, c.) LHC 06/05/18: LM nl, pLAD 50%, p-mLAD 50%, ostD1 70%, ost ramus 30%, ost-pLCx 40%, pLCx 100% (chronically occluded), OM3 100%, p-dRCA 100% (chronically occluded), LIMA-LAD patent, VG-RPDA patent, VG-OM3 mid-graft 60% and 80%   Depression    GERD (gastroesophageal reflux disease)    Hernia of abdominal cavity    History of 2019 novel coronavirus disease (COVID-19) 09/2019   Hyperlipidemia    Hypertension    Hypothyroidism    Iliac artery aneurysm, right ([place])    a.) at the distal landing zone of the right iliac limb measuring 3 cm   Right middle lobe pulmonary nodule 06/30/2021   a.) measured 8 mm by CT on 06/30/2021.   S/P CABG x 4 06/25/2007   a.) LIMA-LAD, SVG-RPDA, SVG-OM3, SVG-D2   Sigmoid diverticulosis    Ventricular tachycardia ([place]) 05/2018   a.) found unresponsive by EMS; VT noted --> defibrillation achieved ROSC. Briefly intubated. Episode felt to be secondary to heat stoke.    Past Surgical History:  Procedure Laterality Date   ABDOMINAL AORTIC ANEURYSM REPAIR N/A 08/29/2007   Procedure: EVAR; Location: [place]; Surgeon: Collene Schlichter, MD   ABDOMINAL AORTIC ANEURYSM REPAIR N/A 12/05/2012   Procedure: Abdominal aortic aneurysm of approximately 6 cm in maximal diameter status post previous endovascular repair with type I and III endoleaks; Location: [place]; Surgeon: Leotis Pain, MD   CARDIAC CATHETERIZATION Left 06/28/2010   3v CAD with patent CABG grafts; LVEF 40%; stable aortic stent graft; Location: [place]; Surgeon: Serafina Royals, MD   CARDIAC CATHETERIZATION Left 06/18/2007   3v CAD; LVEF 50%; refer to CVTS for CABG; Location: [place]; Surgeon: [name], MD   COLONOSCOPY WITH PROPOFOL N/A 11/17/2021   Procedure: COLONOSCOPY WITH PROPOFOL;  Surgeon: Lin Landsman, MD;  Location: [facility];  Service: Gastroenterology;  Laterality: N/A;    CORONARY ARTERY BYPASS GRAFT N/A 06/25/2007   Procedure: 4v CABG (LIMA-LAD, SVG-RPDA, SVG-OM3, SVG-D2); Location: [facility]; Surgeon: Marnee Guarneri, MD   CORONARY/GRAFT ANGIOGRAPHY N/A 06/05/2018   Procedure: Remus Blake ANGIOGRAPHY;  Surgeon: Nelva Bush, MD;  Location: [place] CV LAB;  Service: Cardiovascular;  Laterality: N/A;   ENDOVASCULAR REPAIR/STENT GRAFT N/A 08/04/2021   Procedure: ENDOVASCULAR REPAIR/STENT GRAFT;  Surgeon: Algernon Huxley, MD;  Location: [place] CV LAB;  Service: Cardiovascular;  Laterality: N/A;   EXPLORATORY LAPAROTOMY  1997   HERNIA REPAIR     KNEE ARTHROPLASTY Left 09/19/2016   Procedure: COMPUTER ASSISTED TOTAL KNEE ARTHROPLASTY;  Surgeon: Dereck Leep, MD;  Location: [facility] ORS;  Service: Orthopedics;  Laterality: Left;   RIGHT/LEFT HEART CATH AND CORONARY ANGIOGRAPHY N/A 08/05/2022   Procedure: RIGHT/LEFT HEART CATH AND CORONARY ANGIOGRAPHY;  Surgeon: Wellington Hampshire, MD;  Location: [place] CV LAB;  Service: Cardiovascular;  Laterality: N/A;   TOTAL KNEE ARTHROPLASTY Right 09/2016    Current Medications: Current Meds  Medication Sig   apixaban (ELIQUIS) 5 MG TABS tablet Take 1 tablet (5 mg total) by mouth 2 (  two) times daily.   buPROPion (WELLBUTRIN SR) 200 MG 12 hr tablet Take 1 tablet (200 mg total) by mouth 2 (two) times daily.   Cholecalciferol 50 MCG (2000 UT) CAPS Take 1 capsule by mouth daily.    citalopram (CELEXA) 40 MG tablet Take 1 tablet (40 mg total) by mouth daily.   ezetimibe (ZETIA) 10 MG tablet Take 1 tablet (10 mg total) by mouth daily.   levothyroxine (SYNTHROID) 75 MCG tablet Take 1 tablet (75 mcg total) by mouth daily.   metoprolol succinate (TOPROL XL) 25 MG 24 hr tablet Take 1 tablet (25 mg total) by mouth daily.   omeprazole (PRILOSEC) 20 MG capsule Take 1 capsule (20 mg total) by mouth daily.   rosuvastatin (CRESTOR) 40 MG tablet Take 1 tablet (40 mg total) by mouth daily.   sacubitril-valsartan (ENTRESTO) 24-26 MG  Take 1 tablet by mouth 2 (two) times daily.   sacubitril-valsartan (ENTRESTO) 24-26 MG Take 1 tablet by mouth 2 (two) times daily.   [DISCONTINUED] dapagliflozin propanediol (FARXIGA) 10 MG TABS tablet Take 1 tablet (10 mg total) by mouth daily before breakfast.    Allergies:   Benzodiazepines, Codeine, and Tetracycline   Social History   Socioeconomic History   Marital status: Married    Spouse name: Not on file   Number of children: Not on file   Years of education: 14   Highest education level: Some college, no degree  Occupational History   Occupation: retired  Tobacco Use   Smoking status: Former    Packs/day: 1.00    Years: 35.00    Total pack years: 35.00    Types: Cigarettes    Quit date: 12/16/2005    Years since quitting: 16.8    Passive exposure: Past   Smokeless tobacco: Current    Types: Chew   Tobacco comments:    pt states he very rarely uses smokeless tobacco   Vaping Use   Vaping Use: Never used  Substance and Sexual Activity   Alcohol use: No   Drug use: No   Sexual activity: Yes    Partners: Female  Other Topics Concern   Not on file  Social History Narrative   Not on file   Social Determinants of Health   Financial Resource Strain: Low Risk  (08/09/2022)   Overall Financial Resource Strain (CARDIA)    Difficulty of Paying Living Expenses: Not hard at all  Food Insecurity: No Food Insecurity (08/09/2022)   Hunger Vital Sign    Worried About Running Out of Food in the Last Year: Never true    Ran Out of Food in the Last Year: Never true  Transportation Needs: No Transportation Needs (08/09/2022)   PRAPARE - Hydrologist (Medical): No    Lack of Transportation (Non-Medical): No  Physical Activity: Insufficiently Active (08/09/2022)   Exercise Vital Sign    Days of Exercise per Week: 3 days    Minutes of Exercise per Session: 30 min  Stress: No Stress Concern Present (08/09/2022)   [place]    Feeling of Stress : Not at all  Social Connections: [place] (08/09/2022)   Social Connection and Isolation Panel [NHANES]    Frequency of Communication with Friends and Family: More than three times a week    Frequency of Social Gatherings with Friends and Family: Once a week    Attends Religious Services: More than 4 times per year  Active Member of Clubs or Organizations: Yes    Attends Archivist Meetings: Never    Marital Status: Married     Family History:  The patient's family history includes Depression in his maternal grandfather and maternal grandmother; Heart attack in his father, mother, and another family member; Heart disease in his mother and another family member; Hypertension in his father and mother; Kidney disease in his brother. There is no history of Prostate cancer, Kidney cancer, Stomach cancer, or Colon cancer.  ROS:   12-point review of systems is negative unless otherwise noted in the HPI.   EKGs/Labs/Other Studies Reviewed:    Studies reviewed were summarized above. The additional studies were reviewed today:  [facility] 08/05/2022:   Prox LAD lesion is 50% stenosed.   Prox LAD to Mid LAD lesion is 90% stenosed.   Ost Cx to Prox Cx lesion is 40% stenosed.   Prox Cx lesion is 100% stenosed.   Prox RCA to Dist RCA lesion is 100% stenosed.   Ost Ramus lesion is 30% stenosed.   Ost 1st Diag lesion is 70% stenosed.   3rd Mrg lesion is 100% stenosed.   Origin lesion is 100% stenosed.   LIMA and is normal in caliber.   SVG and is normal in caliber.   SVG.   The graft exhibits no disease.   The graft exhibits minimal luminal irregularities.   The graft exhibits mild .   1.  Significant three-vessel coronary artery disease with patent grafts including LIMA to LAD, SVG to diagonal and SVG to right PDA.  Occluded SVG to OM which is new compared to most recent cardiac catheterization.  However, this  is not unexpected given that the graft was diffusely diseased. 2.  Left ventricular angiography was not performed.  EF was moderately reduced by echo. 3.  Right heart catheterization showed normal right and left-sided filling pressures, normal pulmonary pressures and normal cardiac output.   Recommendations: Continue medical therapy for coronary artery disease. Optimize heart failure treatment.  The patient is euvolemic. __________  2D echo 07/20/2022: 1. Left ventricular ejection fraction, by estimation, is 35 to 40%. The  left ventricle has moderately decreased function. The left ventricle  demonstrates global hypokinesis. There is mild left ventricular  hypertrophy. Left ventricular diastolic  parameters are consistent with Grade I diastolic dysfunction (impaired  relaxation).   2. Right ventricular systolic function is low normal. The right  ventricular size is not well visualized.   3. The mitral valve is normal in structure. Trivial mitral valve  regurgitation.   4. The aortic valve is tricuspid. Aortic valve regurgitation is trivial.  Aortic valve sclerosis is present, with no evidence of aortic valve  stenosis.   5. Aortic dilatation noted. There is mild dilatation of the aortic root,  measuring 43 mm. There is mild dilatation of the ascending aorta,  measuring 42 mm.   6. The inferior vena cava is normal in size with greater than 50%  respiratory variability, suggesting right atrial pressure of 3 mmHg. __________   [facility] MPI 06/23/2022:   Findings are consistent with prior myocardial infarction. The study is intermediate risk.   No ST deviation was noted.   LV perfusion is abnormal. There is no evidence of ischemia. There is evidence of infarction. Defect 1: There is a medium defect with moderate reduction in uptake present in the apical to mid inferolateral location(s) that is fixed. There is abnormal wall motion in the defect area. Consistent with infarction. Defect  2:  There is a medium defect with moderate reduction in uptake present in the basal inferior location(s) that is fixed. There is abnormal wall motion in the defect area. Consistent with infarction.   Left ventricular function is abnormal. Global function is moderately reduced.  Calculated ejection fraction is 38%. __________   LHC 05/2018: Conclusions: Severe native coronary artery disease with subtotal/total occlusions of the proximal/mid LAD, proximal LCx, and proximal RCA. Widely patent LIMA to LAD, SVG to diagonal, and SVG to RPDA. Patent but diffusely diseased SVG to OM3.  There are sequential focal 60% and 80% stenoses in the middle of the graft.  Intervention was not attempted due to the patient's inability to remain still on the table and consistently follow commands, as well as recent acute kidney injury in the setting of rhabdomyolysis.  Additionally, this graft supplies a relatively small territory.   Recommendations: Continue medical therapy and secondary prevention.  If platelets stabilize, recommend adding clopidogrel 75 mg daily to be continued at least 12 months. If patient has signs/symptoms of ischemia or recurrent ventricular tachycardia, consider PCI to SVG to OM3 once mental status improves.   Recommend uninterrupted dual antiplatelet therapy with Aspirin '81mg'$  daily and Clopidogrel '75mg'$  daily for a minimum of 12 months (ACS - Class I recommendation).  Clopidogrel will be added once platelet count has stabilized. __________   2D echo 05/2018: - Left ventricle: The cavity size was mildly dilated. Systolic   function was normal. The estimated ejection fraction was in the   range of 50% to 55%. Challenging images though grossly normal   Wall motion; there were grossly no significant regional wall   motion abnormalities. Left ventricular diastolic function   parameters were normal. - Ascending aorta: The ascending aorta was moderately dilated. 4.4   cm - Left atrium: The appendage  was mildly to moderately dilated. - Right ventricle: Systolic function was normal. - Pulmonary arteries: PA peak pressure: 44 mm Hg (S).   EKG:  EKG is ordered today.  The EKG ordered today demonstrates NSR, 75 bpm, left anterior fascicular block, rare PVC, baseline artifact nonspecific anterior lateral ST-T changes, consistent with prior tracing  Recent Labs: 03/22/2022: TSH 3.960 10/11/2022: ALT 17; BUN 23; Creatinine, Ser 1.61; Hemoglobin 14.8; Platelets 156; Potassium 4.6; Sodium 141  Recent Lipid Panel    Component Value Date/Time   CHOL 146 10/11/2022 1130   CHOL 159 12/18/2015 0827   TRIG 112 10/11/2022 1130   TRIG 166 (H) 12/18/2015 0827   HDL 46 10/11/2022 1130   CHOLHDL 2.9 03/29/2022 0913   CHOLHDL 3.4 09/25/2018 1208   VLDL 31 09/25/2018 1208   VLDL 33 (H) 12/18/2015 0827   LDLCALC 80 10/11/2022 1130    PHYSICAL EXAM:    VS:  BP 126/78 (BP Location: Left Arm, Patient Position: Sitting, Cuff Size: Normal)   Pulse 75   Ht '5\' 11"'$  (1.803 m)   Wt 217 lb 4 oz (98.5 kg)   SpO2 98%   BMI 30.30 kg/m   BMI: Body mass index is 30.3 kg/m.  Physical Exam Vitals reviewed.  Constitutional:      Appearance: He is well-developed.  HENT:     Head: Normocephalic and atraumatic.  Eyes:     General:        Right eye: No discharge.        Left eye: No discharge.  Neck:     Vascular: No JVD.  Cardiovascular:     Rate and Rhythm: Normal rate and  regular rhythm.     Heart sounds: Normal heart sounds, S1 normal and S2 normal. Heart sounds not distant. No midsystolic click and no opening snap. No murmur heard.    No friction rub.  Pulmonary:     Effort: Pulmonary effort is normal. No respiratory distress.     Breath sounds: Normal breath sounds. No decreased breath sounds, wheezing or rales.  Chest:     Chest wall: No tenderness.  Abdominal:     General: There is no distension.  Musculoskeletal:     Cervical back: Normal range of motion.  Skin:    General: Skin is warm  and dry.     Nails: There is no clubbing.  Neurological:     Mental Status: He is alert and oriented to person, place, and time.  Psychiatric:        Speech: Speech normal.        Behavior: Behavior normal.        Thought Content: Thought content normal.        Judgment: Judgment normal.     Wt Readings from Last 3 Encounters:  10/14/22 217 lb 4 oz (98.5 kg)  10/11/22 216 lb 4.8 oz (98.1 kg)  09/22/22 216 lb 3.2 oz (98.1 kg)     ASSESSMENT & PLAN:   CAD status post CABG with stable exertional dyspnea: No symptoms of angina or decompensation.  Recent LHC with medical therapy recommended as outlined above.  Continue aggressive risk factor modification and secondary prevention including apixaban in place of aspirin to minimize bleeding risk in the context of underlying A-fib as well as rosuvastatin, ezetimibe, and metoprolol.  No indication for further ischemic testing at this time.  HFrEF secondary to ICM: He appears euvolemic and well compensated.  In an effort to optimize GDMT, and in the context of renal dysfunction we will discontinue [place] and initiate Entresto 24/26 mg twice daily with continuation of Toprol-XL.  Recheck BMP 1 week after initiating ARNI.  Not requiring a standing diuretic.  Continue to optimize GDMT as tolerated moving forward with recommendation to repeat LHC in several months time.  PAF: Maintaining sinus rhythm.  Continue Toprol-XL.  CHA2DS2-VASc at least 4.  He remains on apixaban 5 mg twice daily and is without symptoms concerning for bleeding with stable hemoglobin, renal function, and potassium.  He does not meet reduced dosing criteria.  HTN: Blood pressure is well-controlled in the office today.  Medical therapy as outlined above.  HLD: LDL 74 in 03/2022.  He remains on rosuvastatin and ezetimibe.  Abdominal aortic aneurysm/iliac artery aneurysm: Followed by vascular surgery.  Dilated aortic root/ascending aorta: Aortic valve tricuspid.  Optimal blood  pressure control.  Monitor with periodic echo for now with consideration for cross-sectional imaging if there is significant trend in measurement.  AKI: Scheduled to see nephrology later this month.  Discontinue Farxiga.  Initiate Entresto as outlined above for cardiomyopathy and nephro protection.     Disposition: F/u with Dr. Fletcher Anon or an APP in 1 month.   Medication Adjustments/Labs and Tests Ordered: Current medicines are reviewed at length with the patient today.  Concerns regarding medicines are outlined above. Medication changes, Labs and Tests ordered today are summarized above and listed in the Patient Instructions accessible in Encounters.   Signed, Christell Faith, PA-C 10/14/2022 11:48 AM     [facility] [address] [place], [place] [address] [phone]

## 2022-10-11 ENCOUNTER — Other Ambulatory Visit: Payer: Self-pay | Admitting: Unknown Physician Specialty

## 2022-10-11 ENCOUNTER — Encounter: Payer: Self-pay | Admitting: Unknown Physician Specialty

## 2022-10-11 ENCOUNTER — Ambulatory Visit (INDEPENDENT_AMBULATORY_CARE_PROVIDER_SITE_OTHER): Payer: Medicare HMO | Admitting: Unknown Physician Specialty

## 2022-10-11 VITALS — BP 122/79 | HR 79 | Temp 98.3°F | Wt 216.3 lb

## 2022-10-11 DIAGNOSIS — N644 Mastodynia: Secondary | ICD-10-CM

## 2022-10-11 DIAGNOSIS — N1831 Chronic kidney disease, stage 3a: Secondary | ICD-10-CM

## 2022-10-11 DIAGNOSIS — E785 Hyperlipidemia, unspecified: Secondary | ICD-10-CM | POA: Diagnosis not present

## 2022-10-11 DIAGNOSIS — I25118 Atherosclerotic heart disease of native coronary artery with other forms of angina pectoris: Secondary | ICD-10-CM

## 2022-10-11 DIAGNOSIS — F324 Major depressive disorder, single episode, in partial remission: Secondary | ICD-10-CM | POA: Diagnosis not present

## 2022-10-11 DIAGNOSIS — I129 Hypertensive chronic kidney disease with stage 1 through stage 4 chronic kidney disease, or unspecified chronic kidney disease: Secondary | ICD-10-CM

## 2022-10-11 DIAGNOSIS — I5020 Unspecified systolic (congestive) heart failure: Secondary | ICD-10-CM

## 2022-10-11 DIAGNOSIS — I1310 Hypertensive heart and chronic kidney disease without heart failure, with stage 1 through stage 4 chronic kidney disease, or unspecified chronic kidney disease: Secondary | ICD-10-CM

## 2022-10-11 DIAGNOSIS — N183 Chronic kidney disease, stage 3 unspecified: Secondary | ICD-10-CM

## 2022-10-11 NOTE — Assessment & Plan Note (Addendum)
EF 40% according to notes from Dr. Fletcher Anon but that was in 2011.  I don't see a recent EF. Is on Farxiga but no ACE at this time

## 2022-10-11 NOTE — Assessment & Plan Note (Signed)
Following with Cardiology.

## 2022-10-11 NOTE — Patient Instructions (Signed)
Please do call to schedule your mammogram; the number to schedule one at either [facility] or [facility] is [phone]   

## 2022-10-11 NOTE — Assessment & Plan Note (Signed)
Planning on seeing nephrology.  Not on an ACE at this time. Will take guidance from nephrology

## 2022-10-11 NOTE — Progress Notes (Signed)
BP 122/79   Pulse 79   Temp 98.3 F (36.8 C) (Oral)   Wt 216 lb 4.8 oz (98.1 kg)   SpO2 97%   BMI 30.17 kg/m    Subjective:    Patient ID: Stuart Harvey, male    DOB: 1951-10-26, 71 y.o.   MRN: [id number]  HPI: Stuart Harvey is a 71 y.o. male  No chief complaint on file.  Pt is here for his 6 months F/U  Depression: States his is doing well.  Has a lot going on health wise. Due to see     10/11/2022   10:46 AM 09/22/2022    1:38 PM 08/09/2022   11:01 AM 09/29/2021    9:10 AM 08/06/2021   11:21 AM  Depression screen PHQ 2/9  Decreased Interest 1 0 0 0 0  Down, Depressed, Hopeless 0 0 0 0 0  PHQ - 2 Score 1 0 0 0 0  Altered sleeping 1 1 0 0   Tired, decreased energy '1 1 1 1   '$ Change in appetite 0 0 1 0   Feeling bad or failure about yourself  0 0 0 0   Trouble concentrating 1 1 0 0   Moving slowly or fidgety/restless 0 0 0 0   Suicidal thoughts 0 0 0 0   PHQ-9 Score '4 3 2 1   '$ Difficult doing work/chores Not difficult at all Somewhat difficult Not difficult at all Not difficult at all    Hypertension Using medications without difficulty Average home BPs: 125/low 70s   No problems or lightheadedness No chest pain Having SOB and recent cath.  Showing blockage but medically managed.   No Edema  Hyperlipidemia Using medications without problems: No Muscle aches  Diet compliance: Exercise:  CKD Planning on going to Nephrology soon.  Non on ACE.  New patient to me and will need to review history to determine why.  Seeing Dr Fletcher Anon for cardiac and to see nephrology soon  Left breast pain X 4 months and notices it a constant tenderness.  Worried about it.    Relevant past medical, surgical, family and social history reviewed and updated as indicated. Interim medical history since our last visit reviewed. Allergies and medications reviewed and updated.  Review of Systems  Per HPI unless specifically indicated above     Objective:    BP 122/79   Pulse 79    Temp 98.3 F (36.8 C) (Oral)   Wt 216 lb 4.8 oz (98.1 kg)   SpO2 97%   BMI 30.17 kg/m   Wt Readings from Last 3 Encounters:  10/11/22 216 lb 4.8 oz (98.1 kg)  09/22/22 216 lb 3.2 oz (98.1 kg)  09/21/22 214 lb (97.1 kg)    Physical Exam Constitutional:      General: He is not in acute distress.    Appearance: Normal appearance. He is well-developed.  HENT:     Head: Normocephalic and atraumatic.  Eyes:     General: Lids are normal. No scleral icterus.       Right eye: No discharge.        Left eye: No discharge.     Conjunctiva/sclera: Conjunctivae normal.  Neck:     Vascular: No carotid bruit or JVD.  Cardiovascular:     Rate and Rhythm: Normal rate and regular rhythm.     Heart sounds: Normal heart sounds.  Pulmonary:     Effort: Pulmonary effort is normal. No respiratory distress.  Breath sounds: Normal breath sounds.  Chest:     Chest wall: No mass, swelling or tenderness.  Breasts:    Breasts are symmetrical.     Right: Normal.     Left: Normal.  Abdominal:     Palpations: There is no hepatomegaly or splenomegaly.  Musculoskeletal:        General: Normal range of motion.     Cervical back: Normal range of motion and neck supple.  Skin:    General: Skin is warm and dry.     Coloration: Skin is not pale.     Findings: No rash.  Neurological:     Mental Status: He is alert and oriented to person, place, and time.  Psychiatric:        Behavior: Behavior normal.        Thought Content: Thought content normal.        Judgment: Judgment normal.     Results for orders placed or performed during the hospital encounter of 28/36/62  Basic metabolic panel  Result Value Ref Range   Sodium 139 135 - 145 mmol/L   Potassium 4.8 3.5 - 5.1 mmol/L   Chloride 110 98 - 111 mmol/L   CO2 23 22 - 32 mmol/L   Glucose, Bld 113 (H) 70 - 99 mg/dL   BUN 31 (H) 8 - 23 mg/dL   Creatinine, Ser 1.71 (H) 0.61 - 1.24 mg/dL   Calcium 9.2 8.9 - 10.3 mg/dL   GFR, Estimated 42 (L)  >60 mL/min   Anion gap 6 5 - 15      Assessment & Plan:   Problem List Items Addressed This Visit       Unprioritized   Benign hypertension with CKD (chronic kidney disease) stage III ([place])    Very concerned.  Discussed typical trajectory.  On Farxiga but not on ACE at this time.  Will follow nephrology recommendations.        CKD (chronic kidney disease) stage 3, GFR 30-59 ml/min ([facility])    Planning on seeing nephrology.  Not on an ACE at this time. Will take guidance from nephrology      Relevant Orders   CBC with Differential/Platelet   Coronary artery disease    Following with Cardiology.        Depression    Stable.  Continue current medications      Heart failure with reduced ejection fraction ([facility])    EF 40% according to notes from Dr. Fletcher Anon but that was in 2011.  I don't see a recent EF. Is on Farxiga but no ACE at this time      Hyperlipidemia    Check lipid panel.  Following with both [place] and cardiology.        Relevant Orders   Lipid Panel w/o Chol/HDL Ratio   Other Visit Diagnoses     Breast pain, left    -  Primary   Tender.  Physical exam normal.  Will order mammogram.   Relevant Orders   MM 3D SCREEN BREAST BILATERAL        Follow up plan: Return in about 6 months (around 04/11/2023).

## 2022-10-11 NOTE — Assessment & Plan Note (Addendum)
Check lipid panel.  Following with both [place] and cardiology.

## 2022-10-11 NOTE — Assessment & Plan Note (Signed)
Very concerned.  Discussed typical trajectory.  On Farxiga but not on ACE at this time.  Will follow nephrology recommendations.

## 2022-10-11 NOTE — Assessment & Plan Note (Addendum)
Stable.  Continue current medications.

## 2022-10-12 ENCOUNTER — Encounter: Payer: Self-pay | Admitting: *Deleted

## 2022-10-12 DIAGNOSIS — I5022 Chronic systolic (congestive) heart failure: Secondary | ICD-10-CM

## 2022-10-12 LAB — COMPREHENSIVE METABOLIC PANEL
ALT: 17 IU/L (ref 0–44)
AST: 31 IU/L (ref 0–40)
Albumin/Globulin Ratio: 1.5 (ref 1.2–2.2)
Albumin: 4.1 g/dL (ref 3.8–4.8)
Alkaline Phosphatase: 116 IU/L (ref 44–121)
BUN/Creatinine Ratio: 14 (ref 10–24)
BUN: 23 mg/dL (ref 8–27)
Bilirubin Total: 0.5 mg/dL (ref 0.0–1.2)
CO2: 20 mmol/L (ref 20–29)
Calcium: 9.5 mg/dL (ref 8.6–10.2)
Chloride: 106 mmol/L (ref 96–106)
Creatinine, Ser: 1.61 mg/dL — ABNORMAL HIGH (ref 0.76–1.27)
Globulin, Total: 2.8 g/dL (ref 1.5–4.5)
Glucose: 96 mg/dL (ref 70–99)
Potassium: 4.6 mmol/L (ref 3.5–5.2)
Sodium: 141 mmol/L (ref 134–144)
Total Protein: 6.9 g/dL (ref 6.0–8.5)
eGFR: 45 mL/min/{1.73_m2} — ABNORMAL LOW (ref >59)

## 2022-10-12 LAB — CBC WITH DIFFERENTIAL/PLATELET
Basophils Absolute: 0 10*3/uL (ref 0.0–0.2)
Basos: 1 %
EOS (ABSOLUTE): 0.3 10*3/uL (ref 0.0–0.4)
Eos: 4 %
Hematocrit: 43.8 % (ref 37.5–51.0)
Hemoglobin: 14.8 g/dL (ref 13.0–17.7)
Immature Grans (Abs): 0 10*3/uL (ref 0.0–0.1)
Immature Granulocytes: 0 %
Lymphocytes Absolute: 3.9 10*3/uL — ABNORMAL HIGH (ref 0.7–3.1)
Lymphs: 46 %
MCH: 30.6 pg (ref 26.6–33.0)
MCHC: 33.8 g/dL (ref 31.5–35.7)
MCV: 91 fL (ref 79–97)
Monocytes Absolute: 0.6 10*3/uL (ref 0.1–0.9)
Monocytes: 7 %
Neutrophils Absolute: 3.5 10*3/uL (ref 1.4–7.0)
Neutrophils: 42 %
Platelets: 156 10*3/uL (ref 150–450)
RBC: 4.84 x10E6/uL (ref 4.14–5.80)
RDW: 13.3 % (ref 11.6–15.4)
WBC: 8.3 10*3/uL (ref 3.4–10.8)

## 2022-10-12 LAB — LIPID PANEL W/O CHOL/HDL RATIO
Cholesterol, Total: 146 mg/dL (ref 100–199)
HDL: 46 mg/dL (ref >39)
LDL Chol Calc (NIH): 80 mg/dL (ref 0–99)
Triglycerides: 112 mg/dL (ref 0–149)
VLDL Cholesterol Cal: 20 mg/dL (ref 5–40)

## 2022-10-12 NOTE — Progress Notes (Signed)
Pulmonary Individual Treatment Plan  Patient Details  Name: JAVAUN DIMPERIO MRN: [id number] Date of Birth: 01-Oct-1951 Referring Provider:   Flowsheet Row Pulmonary Rehab from 09/22/2022 in [facility] Cardiac and Pulmonary Rehab  Referring Provider [name], MD       Initial Encounter Date:  Flowsheet Row Pulmonary Rehab from 09/22/2022 in [facility] Cardiac and Pulmonary Rehab  Date 09/22/22       Visit Diagnosis: Heart failure, chronic systolic ([place])  Patient's Home Medications on Admission:  Current Outpatient Medications:    apixaban (ELIQUIS) 5 MG TABS tablet, Take 1 tablet (5 mg total) by mouth 2 (two) times daily., Disp: 180 tablet, Rfl: 1   buPROPion (WELLBUTRIN SR) 200 MG 12 hr tablet, Take 1 tablet (200 mg total) by mouth 2 (two) times daily., Disp: 180 tablet, Rfl: 0   Cholecalciferol 50 MCG (2000 UT) CAPS, Take 1 capsule by mouth daily. , Disp: , Rfl:    citalopram (CELEXA) 40 MG tablet, Take 1 tablet (40 mg total) by mouth daily., Disp: 90 tablet, Rfl: 1   dapagliflozin propanediol (FARXIGA) 10 MG TABS tablet, Take 1 tablet (10 mg total) by mouth daily before breakfast., Disp: 30 tablet, Rfl: 11   ezetimibe (ZETIA) 10 MG tablet, Take 1 tablet (10 mg total) by mouth daily., Disp: 90 tablet, Rfl: 1   levothyroxine (SYNTHROID) 75 MCG tablet, Take 1 tablet (75 mcg total) by mouth daily., Disp: 90 tablet, Rfl: 0   metoprolol succinate (TOPROL XL) 25 MG 24 hr tablet, Take 1 tablet (25 mg total) by mouth daily., Disp: 90 tablet, Rfl: 3   omeprazole (PRILOSEC) 20 MG capsule, Take 1 capsule (20 mg total) by mouth daily., Disp: 90 capsule, Rfl: 0   rosuvastatin (CRESTOR) 40 MG tablet, Take 1 tablet (40 mg total) by mouth daily., Disp: 90 tablet, Rfl: 2  Past Medical History: Past Medical History:  Diagnosis Date   AAA (abdominal aortic aneurysm) ([place])    a.) s/p EVAR in 08/2007 by Dr. Hulda Humphrey. b.) CTA on 06/30/2021 --> interval aneurysmal dilitation superior to stent graft with the  juxtarenal aorta measuring 3.8 cm.   Anemia    Arthritis    both knees   Chronic anticoagulation    Apixaban   CKD (chronic kidney disease), stage II    Coronary artery disease    a.) 4v CABG @ [facility] 06/2007 (LIMA-LAD, VG-RPDA, VG-OM3,VG-D2); b.) LHC 2011: patent grafts EF 40%, c.) LHC 06/05/18: LM nl, pLAD 50%, p-mLAD 50%, ostD1 70%, ost ramus 30%, ost-pLCx 40%, pLCx 100% (chronically occluded), OM3 100%, p-dRCA 100% (chronically occluded), LIMA-LAD patent, VG-RPDA patent, VG-OM3 mid-graft 60% and 80%   Depression    GERD (gastroesophageal reflux disease)    Hernia of abdominal cavity    History of 2019 novel coronavirus disease (COVID-19) 09/2019   Hyperlipidemia    Hypertension    Hypothyroidism    Iliac artery aneurysm, right ([place])    a.) at the distal landing zone of the right iliac limb measuring 3 cm   Right middle lobe pulmonary nodule 06/30/2021   a.) measured 8 mm by CT on 06/30/2021.   S/P CABG x 4 06/25/2007   a.) LIMA-LAD, SVG-RPDA, SVG-OM3, SVG-D2   Sigmoid diverticulosis    Ventricular tachycardia ([place]) 05/2018   a.) found unresponsive by EMS; VT noted --> defibrillation achieved ROSC. Briefly intubated. Episode felt to be secondary to heat stoke.    Tobacco Use: Social History   Tobacco Use  Smoking Status Former   Packs/day: 1.00  Years: 35.00   Total pack years: 35.00   Types: Cigarettes   Quit date: 12/16/2005   Years since quitting: 16.8   Passive exposure: Past  Smokeless Tobacco Current   Types: Chew  Tobacco Comments   pt states he very rarely uses smokeless tobacco     Labs: Review Flowsheet  More data exists      Latest Ref Rng & Units 01/05/2021 02/18/2021 09/22/2021 03/29/2022 10/11/2022  Labs for ITP Cardiac and Pulmonary Rehab  Cholestrol 100 - 199 mg/dL 171  127  145  142  146   LDL (calc) 0 - 99 mg/dL 104  58  77  74  80   HDL-C >39 mg/dL 52  45  47  49  46   Trlycerides 0 - 149 mg/dL 83  138  114  105  112   Hemoglobin A1c 4.8 - 5.6 % 5.7   - - - -     Pulmonary Assessment Scores:  Pulmonary Assessment Scores     Row Name 09/22/22 1338 09/27/22 1131       ADL UCSD   ADL Phase Entry Entry    SOB Score total -- 37    Rest -- 1    Walk -- 1    Stairs -- 2    Bath -- 1    Dress -- 1    Shop -- 2      CAT Score   CAT Score -- 13      mMRC Score   mMRC Score 1 --             UCSD: Self-administered rating of dyspnea associated with activities of daily living (ADLs) 6-point scale (0 = "not at all" to 5 = "maximal or unable to do because of breathlessness")  Scoring Scores range from 0 to 120.  Minimally important difference is 5 units  CAT: CAT can identify the health impairment of COPD patients and is better correlated with disease progression.  CAT has a scoring range of zero to 40. The CAT score is classified into four groups of low (less than 10), medium (10 - 20), high (21-30) and very high (31-40) based on the impact level of disease on health status. A CAT score over 10 suggests significant symptoms.  A worsening CAT score could be explained by an exacerbation, poor medication adherence, poor inhaler technique, or progression of COPD or comorbid conditions.  CAT MCID is 2 points  mMRC: mMRC (Modified Medical Research Council) Dyspnea Scale is used to assess the degree of baseline functional disability in patients of respiratory disease due to dyspnea. No minimal important difference is established. A decrease in score of 1 point or greater is considered a positive change.   Pulmonary Function Assessment:  Pulmonary Function Assessment - 09/12/22 1113       Breath   Shortness of Breath Yes;Limiting activity             Exercise Target Goals: Exercise Program Goal: Individual exercise prescription set using results from initial 6 min walk test and THRR while considering  patient's activity barriers and safety.   Exercise Prescription Goal: Initial exercise prescription builds to 30-45  minutes a day of aerobic activity, 2-3 days per week.  Home exercise guidelines will be given to patient during program as part of exercise prescription that the participant will acknowledge.  Education: Aerobic Exercise: - Group verbal and visual presentation on the components of exercise prescription. Introduces F.I.T.T principle from ACSM for exercise  prescriptions.  Reviews F.I.T.T. principles of aerobic exercise including progression. Written material given at graduation. Flowsheet Row Pulmonary Rehab from 10/04/2022 in [facility] Cardiac and Pulmonary Rehab  Education need identified 09/22/22       Education: Resistance Exercise: - Group verbal and visual presentation on the components of exercise prescription. Introduces F.I.T.T principle from ACSM for exercise prescriptions  Reviews F.I.T.T. principles of resistance exercise including progression. Written material given at graduation. Flowsheet Row Pulmonary Rehab from 10/04/2022 in [facility] Cardiac and Pulmonary Rehab  Date 10/04/22  Educator NT  Instruction Review Code 1- Verbalizes Understanding        Education: Exercise & Equipment Safety: - Individual verbal instruction and demonstration of equipment use and safety with use of the equipment. Flowsheet Row Pulmonary Rehab from 10/04/2022 in [facility] Cardiac and Pulmonary Rehab  Date 09/12/22  Educator [facility]  Instruction Review Code 1- Verbalizes Understanding       Education: Exercise Physiology & General Exercise Guidelines: - Group verbal and written instruction with models to review the exercise physiology of the cardiovascular system and associated critical values. Provides general exercise guidelines with specific guidelines to those with heart or lung disease.    Education: Flexibility, Balance, Mind/Body Relaxation: - Group verbal and visual presentation with interactive activity on the components of exercise prescription. Introduces F.I.T.T principle from ACSM for exercise  prescriptions. Reviews F.I.T.T. principles of flexibility and balance exercise training including progression. Also discusses the mind body connection.  Reviews various relaxation techniques to help reduce and manage stress (i.e. Deep breathing, progressive muscle relaxation, and visualization). Balance handout provided to take home. Written material given at graduation. Flowsheet Row Pulmonary Rehab from 10/04/2022 in [facility] Cardiac and Pulmonary Rehab  Date 10/04/22  Educator NT  Instruction Review Code 1- Verbalizes Understanding       Activity Barriers & Risk Stratification:  Activity Barriers & Cardiac Risk Stratification - 09/22/22 1327       Activity Barriers & Cardiac Risk Stratification   Activity Barriers Left Knee Replacement;Right Hip Replacement;Other (comment);Shortness of Breath    Comments Hip Pain from Aneurysm    Cardiac Risk Stratification High             6 Minute Walk:  Oxygen Initial Assessment:  Oxygen Initial Assessment - 09/12/22 1112       Home Oxygen   Home Oxygen Device None    Sleep Oxygen Prescription None    Home Exercise Oxygen Prescription None    Home Resting Oxygen Prescription None      Initial 6 min Walk   Oxygen Used None      Program Oxygen Prescription   Program Oxygen Prescription None      Intervention   Short Term Goals To learn and understand importance of monitoring SPO2 with pulse oximeter and demonstrate accurate use of the pulse oximeter.;To learn and understand importance of maintaining oxygen saturations>88%;To learn and demonstrate proper pursed lip breathing techniques or other breathing techniques. ;To learn and exhibit compliance with exercise, home and travel O2 prescription    Long  Term Goals Verbalizes importance of monitoring SPO2 with pulse oximeter and return demonstration;Maintenance of O2 saturations>88%;Exhibits proper breathing techniques, such as pursed lip breathing or other method taught during program  session;Compliance with respiratory medication;Exhibits compliance with exercise, home  and travel O2 prescription             Oxygen Re-Evaluation:  Oxygen Re-Evaluation     Row Name 09/27/22 1042  Program Oxygen Prescription   Program Oxygen Prescription None         Home Oxygen   Home Oxygen Device None       Sleep Oxygen Prescription None       Home Exercise Oxygen Prescription None       Home Resting Oxygen Prescription None         Goals/Expected Outcomes   Short Term Goals To learn and demonstrate proper pursed lip breathing techniques or other breathing techniques.        Long  Term Goals Exhibits proper breathing techniques, such as pursed lip breathing or other method taught during program session       Comments Reviewed PLB technique with pt.  Talked about how it works and it's importance in maintaining their exercise saturations.       Goals/Expected Outcomes Short: Become more profiecient at using PLB. Long: Become independent at using PLB.                Oxygen Discharge (Final Oxygen Re-Evaluation):  Oxygen Re-Evaluation - 09/27/22 1042       Program Oxygen Prescription   Program Oxygen Prescription None      Home Oxygen   Home Oxygen Device None    Sleep Oxygen Prescription None    Home Exercise Oxygen Prescription None    Home Resting Oxygen Prescription None      Goals/Expected Outcomes   Short Term Goals To learn and demonstrate proper pursed lip breathing techniques or other breathing techniques.     Long  Term Goals Exhibits proper breathing techniques, such as pursed lip breathing or other method taught during program session    Comments Reviewed PLB technique with pt.  Talked about how it works and it's importance in maintaining their exercise saturations.    Goals/Expected Outcomes Short: Become more profiecient at using PLB. Long: Become independent at using PLB.             Initial Exercise Prescription:  Initial  Exercise Prescription - 09/22/22 1300       Date of Initial Exercise RX and Referring Provider   Date 09/22/22    Referring Provider [name], MD      Oxygen   Maintain Oxygen Saturation 88% or higher      Treadmill   MPH 2    Grade 0.5    Minutes 15    METs 2.67      Recumbant Bike   Level 1    RPM 50    Watts 12    Minutes 15    METs 2.38      REL-XR   Level 1    Watts 12    Speed 50    Minutes 15    METs 2.38      Prescription Details   Frequency (times per week) 2    Duration Progress to 30 minutes of continuous aerobic without signs/symptoms of physical distress      Intensity   THRR 40-80% of Max Heartrate 99-134    Ratings of Perceived Exertion 11-13    Perceived Dyspnea 0-4      Progression   Progression Continue to progress workloads to maintain intensity without signs/symptoms of physical distress.      Resistance Training   Training Prescription Yes    Weight 4 lb    Reps 10-15             Perform Capillary Blood Glucose checks as needed.  Exercise Prescription Changes:  Exercise Prescription Changes     Row Name 09/22/22 1300 10/10/22 1300           Response to Exercise   Blood Pressure (Admit) 102/60 124/70      Blood Pressure (Exercise) 118/76 142/74      Blood Pressure (Exit) 108/68 108/70      Heart Rate (Admit) 66 bpm 78 bpm      Heart Rate (Exercise) 88 bpm 101 bpm      Heart Rate (Exit) 69 bpm 79 bpm      Oxygen Saturation (Admit) 96 % 98 %      Oxygen Saturation (Exercise) 94 % 94 %      Oxygen Saturation (Exit) 97 % 96 %      Rating of Perceived Exertion (Exercise) 11 12      Perceived Dyspnea (Exercise) 1 2      Symptoms Hip Pain 4/10 Dizziness      Comments 6MWT Results First 3 sessions of rehab      Duration -- Progress to 30 minutes of  aerobic without signs/symptoms of physical distress      Intensity -- THRR unchanged        Progression   Progression -- Continue to progress workloads to maintain  intensity without signs/symptoms of physical distress.      Average METs -- 2.66        Resistance Training   Training Prescription -- Yes      Weight -- 4 lb\      Reps -- 10-15        Interval Training   Interval Training -- No        Treadmill   MPH -- 1.5      Grade -- 0      Minutes -- 15      METs -- 2.15        NuStep   Level -- 3      Minutes -- 15      METs -- 2        REL-XR   Level -- 1      Minutes -- 15      METs -- 3.9        Oxygen   Maintain Oxygen Saturation -- 88% or higher               Exercise Comments:   Exercise Comments     Row Name 09/27/22 1041           Exercise Comments First full day of exercise!  Patient was oriented to gym and equipment including functions, settings, policies, and procedures.  Patient's individual exercise prescription and treatment plan were reviewed.  All starting workloads were established based on the results of the 6 minute walk test done at initial orientation visit.  The plan for exercise progression was also introduced and progression will be customized based on patient's performance and goals.                Exercise Goals and Review:   Exercise Goals     Row Name 09/22/22 1334             Exercise Goals   Increase Physical Activity Yes       Intervention Provide advice, education, support and counseling about physical activity/exercise needs.;Develop an individualized exercise prescription for aerobic and resistive training based on initial evaluation findings, risk stratification, comorbidities and participant's personal goals.       Expected Outcomes Short Term:  Attend rehab on a regular basis to increase amount of physical activity.;Long Term: Add in home exercise to make exercise part of routine and to increase amount of physical activity.;Long Term: Exercising regularly at least 3-5 days a week.       Increase Strength and Stamina Yes       Intervention Provide advice, education, support  and counseling about physical activity/exercise needs.;Develop an individualized exercise prescription for aerobic and resistive training based on initial evaluation findings, risk stratification, comorbidities and participant's personal goals.       Expected Outcomes Short Term: Increase workloads from initial exercise prescription for resistance, speed, and METs.;Short Term: Perform resistance training exercises routinely during rehab and add in resistance training at home;Long Term: Improve cardiorespiratory fitness, muscular endurance and strength as measured by increased METs and functional capacity (6MWT)       Able to understand and use rate of perceived exertion (RPE) scale Yes       Intervention Provide education and explanation on how to use RPE scale       Expected Outcomes Short Term: Able to use RPE daily in rehab to express subjective intensity level;Long Term:  Able to use RPE to guide intensity level when exercising independently       Able to understand and use Dyspnea scale Yes       Intervention Provide education and explanation on how to use Dyspnea scale       Expected Outcomes Short Term: Able to use Dyspnea scale daily in rehab to express subjective sense of shortness of breath during exertion;Long Term: Able to use Dyspnea scale to guide intensity level when exercising independently       Knowledge and understanding of Target Heart Rate Range (THRR) Yes       Intervention Provide education and explanation of THRR including how the numbers were predicted and where they are located for reference       Expected Outcomes Long Term: Able to use THRR to govern intensity when exercising independently;Short Term: Able to state/look up THRR;Short Term: Able to use daily as guideline for intensity in rehab       Able to check pulse independently Yes       Intervention Provide education and demonstration on how to check pulse in carotid and radial arteries.;Review the importance of being  able to check your own pulse for safety during independent exercise       Expected Outcomes Short Term: Able to explain why pulse checking is important during independent exercise;Long Term: Able to check pulse independently and accurately       Understanding of Exercise Prescription Yes       Intervention Provide education, explanation, and written materials on patient's individual exercise prescription       Expected Outcomes Short Term: Able to explain program exercise prescription;Long Term: Able to explain home exercise prescription to exercise independently                Exercise Goals Re-Evaluation :  Exercise Goals Re-Evaluation     Row Name 09/27/22 1041 10/10/22 1340           Exercise Goal Re-Evaluation   Exercise Goals Review Able to understand and use rate of perceived exertion (RPE) scale;Able to understand and use Dyspnea scale;Knowledge and understanding of Target Heart Rate Range (THRR);Understanding of Exercise Prescription Understanding of Exercise Prescription;Increase Physical Activity;Increase Strength and Stamina      Comments Reviewed RPE scale, THR and program prescription with pt today.  Pt voiced understanding and was given a copy of goals to take home. [name] is off to a good start in rehab. He had an overall average MET level of 2.66 METs during his first three sessions. He also did well with level 3 on the T4 and level 1 on the XR. He started on the treadmill at 1.5 mph and no incline. We will continue to monitor his progress in the program.      Expected Outcomes Short: Use RPE daily to regulate intensity. Long: Follow program prescription in THR. Short: increase speed on the treadmill. Long: Continue to increase strength and stamina.               Discharge Exercise Prescription (Final Exercise Prescription Changes):  Exercise Prescription Changes - 10/10/22 1300       Response to Exercise   Blood Pressure (Admit) 124/70    Blood Pressure  (Exercise) 142/74    Blood Pressure (Exit) 108/70    Heart Rate (Admit) 78 bpm    Heart Rate (Exercise) 101 bpm    Heart Rate (Exit) 79 bpm    Oxygen Saturation (Admit) 98 %    Oxygen Saturation (Exercise) 94 %    Oxygen Saturation (Exit) 96 %    Rating of Perceived Exertion (Exercise) 12    Perceived Dyspnea (Exercise) 2    Symptoms Dizziness    Comments First 3 sessions of rehab    Duration Progress to 30 minutes of  aerobic without signs/symptoms of physical distress    Intensity THRR unchanged      Progression   Progression Continue to progress workloads to maintain intensity without signs/symptoms of physical distress.    Average METs 2.66      Resistance Training   Training Prescription Yes    Weight 4 lb\    Reps 10-15      Interval Training   Interval Training No      Treadmill   MPH 1.5    Grade 0    Minutes 15    METs 2.15      NuStep   Level 3    Minutes 15    METs 2      REL-XR   Level 1    Minutes 15    METs 3.9      Oxygen   Maintain Oxygen Saturation 88% or higher             Nutrition:  Target Goals: Understanding of nutrition guidelines, daily intake of sodium <1565m, cholesterol <201m calories 30% from fat and 7% or less from saturated fats, daily to have 5 or more servings of fruits and vegetables.  Education: All About Nutrition: -Group instruction provided by verbal, written material, interactive activities, discussions, models, and posters to present general guidelines for heart healthy nutrition including fat, fiber, MyPlate, the role of sodium in heart healthy nutrition, utilization of the nutrition label, and utilization of this knowledge for meal planning. Follow up email sent as well. Written material given at graduation. [facility]  Education need identified 09/22/22  Date 09/29/22  Educator MC[place] DraftInstruction Review Code 1- Verbalizes Understanding        Biometrics:  Pre Biometrics - 09/22/22 1334       Pre Biometrics   Height _0  (1.803 m)    Weight 216 lb 3.2 oz (98.1 kg)    BMI (Calculated) 30.17    Single Leg Stand 5 seconds  R             Nutrition Therapy Plan and Nutrition Goals:  Nutrition Therapy & Goals - 09/22/22 1049       Nutrition Therapy   Diet Heart healthy, low Na, CKD stg 3 MNT    Drug/Food Interactions Statins/Certain Fruits    Protein (specify units) 85-95g    Fiber 30 grams    Whole Grain Foods 3 servings   Depending on labwork   Saturated Fats 16 max. grams    Fruits and Vegetables 8 servings/day    Sodium 1.5 grams      Personal Nutrition Goals   Nutrition Goal ST: practice MyPlate guidelines, focus on variety of plant foods LT: limit saturated fat <16g/day, limit sodium intake <1.5g/day, eat at least 30g of fiber per day, alter potassium and phosphorus intake based of of labwork for CKD.    Comments 71 y.o. M admitted to pulmonary rehab for chronic HF. PMHx includes CAD, MI 2008, HLD, HTN, CKD stg 3, AAA s/p EVAR (2008), paroxysmal A-fib, HFrEF, hypothyroidism, current tobacco use (dip), arthritis, depression, GERD, diverticulosis. PSHx includes CABG 2008, EVAR 2008 and 2014, coronary/graft angiography 2019, endovascular/stent graft 2022, bilateral TKA 2017. Relevant medications includes bupropion, vit D3, citalopram, synthroid, melatonin, omeprazole, rosuvastatin. Last GFR 08/29/22 was 42. PYP Score: 56. Vegetables & Fruits 5/12. Breads, Grains & Cereals 5/12. Red & Processed Meat 7/12. Poultry 2/2. Fish & Shellfish 0/4. Beans, Nuts & Seeds 3/4. Milk & Dairy Foods 3/6. Toppings, Oils, Seasonings & Salt 17/20. Sweets, Snacks & Restaurant Food 7/14. Beverages 7/10. He reports watching sodium B: does not usually have breakfast L: low sodium ham/turkey on sandwich (white bread) D: pork chops, chicken, spaghetti, roast beef. he likes to have baked potatoes and white beans. He reports having red meat  <2x/week. He rpeorts his wife will cook dinner. He reports his wife uses liquid plant oils. Drinks: 2 cups of coffee (2 sugars), water, sometimes sweet tea or pepsi (1-2x/day). He reports having salad every meal (cucumbers, lettuce, tomato, some cheese, [place] (2 tbsp). He reports that his wife is a great support system for him and makes sure to watch what he eats. Discussed heart healthy eating and CKD stg 3 MNT.      Intervention Plan   Intervention Prescribe, educate and counsel regarding individualized specific dietary modifications aiming towards targeted core components such as weight, hypertension, lipid management, diabetes, heart failure and other comorbidities.;Nutrition handout(s) given to patient.    Expected Outcomes Short Term Goal: Understand basic principles of dietary content, such as calories, fat, sodium, cholesterol and nutrients.;Short Term Goal: A plan has been developed with personal nutrition goals set during dietitian appointment.;Long Term Goal: Adherence to prescribed nutrition plan.             Nutrition Assessments:  MEDIFICTS Score Key: ?70 Need to make dietary changes  40-70 Heart Healthy Diet ? 40 Therapeutic Level Cholesterol Diet  Flowsheet Row Pulmonary Rehab from 09/22/2022 in [facility] Cardiac and Pulmonary Rehab  Picture Your Plate Total Score on Admission 56      Picture Your Plate Scores: <38 Unhealthy dietary pattern with much room for improvement. 41-50 Dietary pattern unlikely to meet recommendations for good health and room for improvement. 51-60 More healthful dietary pattern, with some room for improvement.  >60 Healthy dietary pattern, although there may be some specific behaviors that could be improved.   Nutrition Goals Re-Evaluation:   Nutrition Goals Discharge (Final Nutrition Goals Re-Evaluation):   Psychosocial:  Target Goals: Acknowledge presence or absence of significant depression and/or stress, maximize coping skills,  provide positive support system. Participant is able to verbalize types and ability to use techniques and skills needed for reducing stress and depression.   Education: Stress, Anxiety, and Depression - Group verbal and visual presentation to define topics covered.  Reviews how body is impacted by stress, anxiety, and depression.  Also discusses healthy ways to reduce stress and to treat/manage anxiety and depression.  Written material given at graduation.   Education: Sleep Hygiene -Provides group verbal and written instruction about how sleep can affect your health.  Define sleep hygiene, discuss sleep cycles and impact of sleep habits. Review good sleep hygiene tips.    Initial Review & Psychosocial Screening:  Initial Psych Review & Screening - 09/12/22 1114       Initial Review   Current issues with Current Psychotropic Meds;History of Depression      Family Dynamics   Good Support System? Yes    Comments [name] can look to his wife for suppprt. He feels concerned for his health since he also has a kidney disease.      Barriers   Psychosocial barriers to participate in program The patient should benefit from training in stress management and relaxation.      Screening Interventions   Interventions Encouraged to exercise;To provide support and resources with identified psychosocial needs;Provide feedback about the scores to participant    Expected Outcomes Short Term goal: Utilizing psychosocial counselor, staff and physician to assist with identification of specific Stressors or current issues interfering with healing process. Setting desired goal for each stressor or current issue identified.;Long Term Goal: Stressors or current issues are controlled or eliminated.;Short Term goal: Identification and review with participant of any Quality of Life or Depression concerns found by scoring the questionnaire.;Long Term goal: The participant improves quality of Life and PHQ9 Scores as seen  by post scores and/or verbalization of changes             Quality of Life Scores:  Quality of Life - 09/22/22 1335       Quality of Life   Select Quality of Life      Quality of Life Scores   Health/Function Pre 20.27 %    Socioeconomic Pre 22.17 %    Psych/Spiritual Pre 22.93 %    Family Pre 28.5 %    GLOBAL Pre 22.42 %            Scores of 19 and below usually indicate a poorer quality of life in these areas.  A difference of  2-3 points is a clinically meaningful difference.  A difference of 2-3 points in the total score of the Quality of Life Index has been associated with significant improvement in overall quality of life, self-image, physical symptoms, and general health in studies assessing change in quality of life.  PHQ-9: Review Flowsheet  More data exists      10/11/2022 09/22/2022 08/09/2022 09/29/2021 08/06/2021  Depression screen PHQ 2/9  Decreased Interest 1 0 0 0 0  Down, Depressed, Hopeless 0 0 0 0 0  PHQ - 2 Score 1 0 0 0 0  Altered sleeping 1 1 0 0 -  Tired, decreased energy _0 -  Change in appetite 0 0 1 0 -  Feeling bad or failure about yourself  0 0 0 0 -  Trouble concentrating 1 1 0 0 -  Moving slowly or fidgety/restless 0 0 0  0 -  Suicidal thoughts 0 0 0 0 -  PHQ-9 Score _0 -  Difficult doing work/chores Not difficult at all Somewhat difficult Not difficult at all Not difficult at all -   Interpretation of Total Score  Total Score Depression Severity:  1-4 = Minimal depression, 5-9 = Mild depression, 10-14 = Moderate depression, 15-19 = Moderately severe depression, 20-27 = Severe depression   Psychosocial Evaluation and Intervention:  Psychosocial Evaluation - 09/12/22 1115       Psychosocial Evaluation & Interventions   Interventions Encouraged to exercise with the program and follow exercise prescription;Relaxation education;Stress management education    Comments [name] can look to his wife for suppprt. He feels concerned  for his health since he also has a kidney disease.    Expected Outcomes Short: Start [facility] to help with mood. Long: Maintain a healthy mental state    Continue Psychosocial Services  Follow up required by staff             Psychosocial Re-Evaluation:   Psychosocial Discharge (Final Psychosocial Re-Evaluation):   Education: Education Goals: Education classes will be provided on a weekly basis, covering required topics. Participant will state understanding/return demonstration of topics presented.  Learning Barriers/Preferences:  Learning Barriers/Preferences - 09/12/22 1113       Learning Barriers/Preferences   Learning Barriers None    Learning Preferences None             General Pulmonary Education Topics:  Infection Prevention: - Provides verbal and written material to individual with discussion of infection control including proper hand washing and proper equipment cleaning during exercise session. Flowsheet Row Pulmonary Rehab from 10/04/2022 in [facility] Cardiac and Pulmonary Rehab  Date 09/12/22  Educator [facility]  Instruction Review Code 1- Verbalizes Understanding       Falls Prevention: - Provides verbal and written material to individual with discussion of falls prevention and safety. Flowsheet Row Pulmonary Rehab from 10/04/2022 in [facility] Cardiac and Pulmonary Rehab  Date 09/12/22  Educator [facility]  Instruction Review Code 1- Verbalizes Understanding       Chronic Lung Disease Review: - Group verbal instruction with posters, models, PowerPoint presentations and videos,  to review new updates, new respiratory medications, new advancements in procedures and treatments. Providing information on websites and "800" numbers for continued self-education. Includes information about supplement oxygen, available portable oxygen systems, continuous and intermittent flow rates, oxygen safety, concentrators, and Medicare reimbursement for oxygen. Explanation of Pulmonary  Drugs, including class, frequency, complications, importance of spacers, rinsing mouth after steroid MDI's, and proper cleaning methods for nebulizers. Review of basic lung anatomy and physiology related to function, structure, and complications of lung disease. Review of risk factors. Discussion about methods for diagnosing sleep apnea and types of masks and machines for OSA. Includes a review of the use of types of environmental controls: home humidity, furnaces, filters, dust mite/pet prevention, HEPA vacuums. Discussion about weather changes, air quality and the benefits of nasal washing. Instruction on Warning signs, infection symptoms, calling MD promptly, preventive modes, and value of vaccinations. Review of effective airway clearance, coughing and/or vibration techniques. Emphasizing that all should Create an Action Plan. Written material given at graduation.   AED/CPR: - Group verbal and written instruction with the use of models to demonstrate the basic use of the AED with the basic ABC's of resuscitation.    Anatomy and Cardiac Procedures: - Group verbal and visual presentation and models provide information about basic cardiac anatomy and function. Reviews  the testing methods done to diagnose heart disease and the outcomes of the test results. Describes the treatment choices: Medical Management, Angioplasty, or Coronary Bypass Surgery for treating various heart conditions including Myocardial Infarction, Angina, Valve Disease, and Cardiac Arrhythmias.  Written material given at graduation. Flowsheet Row Pulmonary Rehab from 10/04/2022 in [facility] Cardiac and Pulmonary Rehab  Education need identified 09/22/22       Medication Safety: - Group verbal and visual instruction to review commonly prescribed medications for heart and lung disease. Reviews the medication, class of the drug, and side effects. Includes the steps to properly store meds and maintain the prescription regimen.  Written  material given at graduation.   Other: -Provides group and verbal instruction on various topics (see comments)   Knowledge Questionnaire Score:  Knowledge Questionnaire Score - 09/22/22 1336       Knowledge Questionnaire Score   Pre Score 22/26              Core Components/Risk Factors/Patient Goals at Admission:  Personal Goals and Risk Factors at Admission - 09/22/22 1336       Core Components/Risk Factors/Patient Goals on Admission    Weight Management Yes;Weight Maintenance    Intervention Weight Management: Develop a combined nutrition and exercise program designed to reach desired caloric intake, while maintaining appropriate intake of nutrient and fiber, sodium and fats, and appropriate energy expenditure required for the weight goal.;Weight Management: Provide education and appropriate resources to help participant work on and attain dietary goals.;Weight Management/Obesity: Establish reasonable short term and long term weight goals.    Admit Weight 216 lb 3.2 oz (98.1 kg)    Goal Weight: Short Term 210 lb (95.3 kg)    Goal Weight: Long Term 200 lb (90.7 kg)    Expected Outcomes Weight Maintenance: Understanding of the daily nutrition guidelines, which includes 25-35% calories from fat, 7% or less cal from saturated fats, less than 22m cholesterol, less than 1.5gm of sodium, & 5 or more servings of fruits and vegetables daily;Long Term: Adherence to nutrition and physical activity/exercise program aimed toward attainment of established weight goal;Short Term: Continue to assess and modify interventions until short term weight is achieved;Understanding recommendations for meals to include 15-35% energy as protein, 25-35% energy from fat, 35-60% energy from carbohydrates, less than 2075mof dietary cholesterol, 20-35 gm of total fiber daily;Understanding of distribution of calorie intake throughout the day with the consumption of 4-5 meals/snacks    Improve shortness of breath  with ADL's Yes    Intervention Provide education, individualized exercise plan and daily activity instruction to help decrease symptoms of SOB with activities of daily living.    Expected Outcomes Short Term: Improve cardiorespiratory fitness to achieve a reduction of symptoms when performing ADLs;Long Term: Be able to perform more ADLs without symptoms or delay the onset of symptoms    Hypertension Yes    Intervention Provide education on lifestyle modifcations including regular physical activity/exercise, weight management, moderate sodium restriction and increased consumption of fresh fruit, vegetables, and low fat dairy, alcohol moderation, and smoking cessation.;Monitor prescription use compliance.    Expected Outcomes Short Term: Continued assessment and intervention until BP is < 140/9036mG in hypertensive participants. < 130/98m68m in hypertensive participants with diabetes, heart failure or chronic kidney disease.;Long Term: Maintenance of blood pressure at goal levels.    Lipids Yes    Intervention Provide education and support for participant on nutrition & aerobic/resistive exercise along with prescribed medications to achieve LDL <70mg20mL >40mg.42m  Expected Outcomes Short Term: Participant states understanding of desired cholesterol values and is compliant with medications prescribed. Participant is following exercise prescription and nutrition guidelines.;Long Term: Cholesterol controlled with medications as prescribed, with individualized exercise RX and with personalized nutrition plan. Value goals: LDL < 23m, HDL > 40 mg.             Education:Diabetes - Individual verbal and written instruction to review signs/symptoms of diabetes, desired ranges of glucose level fasting, after meals and with exercise. Acknowledge that pre and post exercise glucose checks will be done for 3 sessions at entry of program.   Know Your Numbers and Heart Failure: - Group verbal and visual  instruction to discuss disease risk factors for cardiac and pulmonary disease and treatment options.  Reviews associated critical values for Overweight/Obesity, Hypertension, Cholesterol, and Diabetes.  Discusses basics of heart failure: signs/symptoms and treatments.  Introduces Heart Failure Zone chart for action plan for heart failure.  Written material given at graduation.   Core Components/Risk Factors/Patient Goals Review:    Core Components/Risk Factors/Patient Goals at Discharge (Final Review):    ITP Comments:  ITP Comments     Row Name 09/12/22 1111 09/22/22 1105 09/27/22 1041 10/12/22 0818     ITP Comments Virtual Visit completed. Patient informed on EP and RD appointment and 6 Minute walk test. Patient also informed of patient health questionnaires on My Chart. Patient Verbalizes understanding. [facility] Completed 6MWT and gym orientation. Initial ITP created and sent for review to Dr. MEmily Filbert Medical Director. First full day of exercise!  Patient was oriented to gym and equipment including functions, settings, policies, and procedures.  Patient's individual exercise prescription and treatment plan were reviewed.  All starting workloads were established based on the results of the 6 minute walk test done at initial orientation visit.  The plan for exercise progression was also introduced and progression will be customized based on patient's performance and goals. 30 Day review completed. Medical Director ITP review done, changes made as directed, and signed approval by Medical Director.   New to program             Comments:

## 2022-10-13 ENCOUNTER — Encounter: Payer: Medicare HMO | Admitting: *Deleted

## 2022-10-13 DIAGNOSIS — I5022 Chronic systolic (congestive) heart failure: Secondary | ICD-10-CM

## 2022-10-13 NOTE — Progress Notes (Signed)
Daily Session Note  Patient Details  Name: Stuart Harvey MRN: [id number] Date of Birth: 07/18/1951 Referring Provider:   [name] Pulmonary Rehab from 09/22/2022 in [facility] Cardiac and Pulmonary Rehab  Referring Provider [name], MD       Encounter Date: 10/13/2022  Check In:  Session Check In - 10/13/22 1043       Check-In   Supervising physician immediately available to respond to emergencies See telemetry face sheet for immediately available ER MD    Location [facility]-Cardiac & Pulmonary Rehab    Staff Present Heath Lark, RN, BSN, CCRP;Jessica [place], MA, RCEP, CCRP, CCET;Noah Tickle, BS, Exercise Physiologist;Melissa [place], RDN, LDN    Virtual Visit No    Medication changes reported     No    Fall or balance concerns reported    No    Warm-up and Cool-down Performed on first and last piece of equipment    Resistance Training Performed Yes    VAD Patient? No    PAD/SET Patient? No      Pain Assessment   Currently in Pain? No/denies                Social History   Tobacco Use  Smoking Status Former   Packs/day: 1.00   Years: 35.00   Total pack years: 35.00   Types: Cigarettes   Quit date: 12/16/2005   Years since quitting: 16.8   Passive exposure: Past  Smokeless Tobacco Current   Types: Chew  Tobacco Comments   pt states he very rarely uses smokeless tobacco     Goals Met:  Proper associated with RPD/PD & O2 Sat Independence with exercise equipment Exercise tolerated well No report of concerns or symptoms today  Goals Unmet:  Not Applicable  Comments: Pt able to follow exercise prescription today without complaint.  Will continue to monitor for progression.    Dr. Emily Filbert is Medical Director for [facility].  Dr. Ottie Glazier is Medical Director for [facility] Pulmonary Rehabilitation.

## 2022-10-14 ENCOUNTER — Ambulatory Visit: Payer: Medicare HMO | Attending: Physician Assistant | Admitting: Physician Assistant

## 2022-10-14 ENCOUNTER — Encounter: Payer: Self-pay | Admitting: Physician Assistant

## 2022-10-14 VITALS — BP 126/78 | HR 75 | Ht 71.0 in | Wt 217.2 lb

## 2022-10-14 DIAGNOSIS — Z9889 Other specified postprocedural states: Secondary | ICD-10-CM

## 2022-10-14 DIAGNOSIS — Z8679 Personal history of other diseases of the circulatory system: Secondary | ICD-10-CM

## 2022-10-14 DIAGNOSIS — I2510 Atherosclerotic heart disease of native coronary artery without angina pectoris: Secondary | ICD-10-CM | POA: Diagnosis not present

## 2022-10-14 DIAGNOSIS — I5020 Unspecified systolic (congestive) heart failure: Secondary | ICD-10-CM | POA: Diagnosis not present

## 2022-10-14 DIAGNOSIS — I714 Abdominal aortic aneurysm, without rupture, unspecified: Secondary | ICD-10-CM

## 2022-10-14 DIAGNOSIS — I77819 Aortic ectasia, unspecified site: Secondary | ICD-10-CM

## 2022-10-14 DIAGNOSIS — Z951 Presence of aortocoronary bypass graft: Secondary | ICD-10-CM

## 2022-10-14 DIAGNOSIS — E785 Hyperlipidemia, unspecified: Secondary | ICD-10-CM

## 2022-10-14 DIAGNOSIS — I480 Paroxysmal atrial fibrillation: Secondary | ICD-10-CM

## 2022-10-14 DIAGNOSIS — N179 Acute kidney failure, unspecified: Secondary | ICD-10-CM

## 2022-10-14 DIAGNOSIS — I723 Aneurysm of iliac artery: Secondary | ICD-10-CM

## 2022-10-14 DIAGNOSIS — I10 Essential (primary) hypertension: Secondary | ICD-10-CM

## 2022-10-14 MED ORDER — ENTRESTO 24-26 MG PO TABS: 1.0000 | ORAL_TABLET | Freq: Two times a day (BID) | ORAL | 11 refills | Status: DC

## 2022-10-14 MED ORDER — ENTRESTO 24-26 MG PO TABS: 1.0000 | ORAL_TABLET | Freq: Two times a day (BID) | ORAL | 3 refills | Status: DC

## 2022-10-14 NOTE — Patient Instructions (Signed)
Medication Instructions:  Your physician has recommended you make the following change in your medication:   STOP Farxiga START Entresto 24-26 mg twice a day  *If you need a refill on your cardiac medications before your next appointment, please call your pharmacy*   Lab Work: BMET in 1 week after starting [employer]. No appointment is needed and just go to the following location:  [place] at [facility] 1st desk on the right to check in (REGISTRATION)  Lab hours: Monday- Friday (7:30 am- 5:30 pm)  If you have labs (blood work) drawn today and your tests are completely normal, you will receive your results only by: MyChart Message (if you have MyChart) OR A paper copy in the mail If you have any lab test that is abnormal or we need to change your treatment, we will call you to review the results.   Testing/Procedures: None   Follow-Up: At [facility], you and your health needs are our priority.  As part of our continuing mission to provide you with exceptional heart care, we have created designated Provider Care Teams.  These Care Teams include your primary Cardiologist (physician) and Advanced Practice Providers (APPs -  Physician Assistants and Nurse Practitioners) who all work together to provide you with the care you need, when you need it.   Your next appointment:   1 month(s)  The format for your next appointment:   In Person  Provider:   [name], MD or Christell Faith, PA-C       Important Information About Sugar

## 2022-10-18 ENCOUNTER — Encounter: Payer: Medicare HMO | Attending: Cardiovascular Disease | Admitting: *Deleted

## 2022-10-18 DIAGNOSIS — I5022 Chronic systolic (congestive) heart failure: Secondary | ICD-10-CM | POA: Diagnosis not present

## 2022-10-18 NOTE — Progress Notes (Signed)
Daily Session Note  Patient Details  Name: Stuart Harvey MRN: [id number] Date of Birth: 05/15/1951 Referring Provider:   [name] Pulmonary Rehab from 09/22/2022 in [facility] Cardiac and Pulmonary Rehab  Referring Provider [name], MD       Encounter Date: 10/18/2022  Check In:  Session Check In - 10/18/22 1000       Check-In   Supervising physician immediately available to respond to emergencies See telemetry face sheet for immediately available ER MD    Location [facility]-Cardiac & Pulmonary Rehab    Staff Present Heath Lark, RN, BSN, CCRP;Jessica [place], MA, [place], CCRP, Bertram Gala, MS, ACSM CEP, Exercise Physiologist;Meredith Sherryll Burger, RN BSN    Virtual Visit No    Medication changes reported     No    Fall or balance concerns reported    No    Warm-up and Cool-down Performed on first and last piece of equipment    Resistance Training Performed Yes    VAD Patient? No    PAD/SET Patient? No      Pain Assessment   Currently in Pain? No/denies                Social History   Tobacco Use  Smoking Status Former   Packs/day: 1.00   Years: 35.00   Total pack years: 35.00   Types: Cigarettes   Quit date: 12/16/2005   Years since quitting: 16.8   Passive exposure: Past  Smokeless Tobacco Current   Types: Chew  Tobacco Comments   pt states he very rarely uses smokeless tobacco     Goals Met:  Proper associated with RPD/PD & O2 Sat Independence with exercise equipment Exercise tolerated well No report of concerns or symptoms today  Goals Unmet:  Not Applicable  Comments: Pt able to follow exercise prescription today without complaint.  Will continue to monitor for progression.    Dr. Emily Filbert is Medical Director for [place].  Dr. Ottie Glazier is Medical Director for [facility] Pulmonary Rehabilitation.

## 2022-10-20 ENCOUNTER — Encounter: Payer: Medicare HMO | Admitting: *Deleted

## 2022-10-20 DIAGNOSIS — I5022 Chronic systolic (congestive) heart failure: Secondary | ICD-10-CM | POA: Diagnosis not present

## 2022-10-20 NOTE — Progress Notes (Signed)
Daily Session Note  Patient Details  Name: Stuart Harvey MRN: [id number] Date of Birth: [birth year] Referring Provider:   [name] Pulmonary Rehab from 09/22/2022 in [facility] Cardiac and Pulmonary Rehab  Referring Provider [name], MD       Encounter Date: 10/20/2022  Check In:  Session Check In - 10/20/22 1007       Check-In   Supervising physician immediately available to respond to emergencies See telemetry face sheet for immediately available ER MD    Location [facility]-Cardiac & Pulmonary Rehab    Staff Present Renita Papa, RN BSN;Jessica Luan Pulling, MA, RCEP, CCRP, Bertram Gala, MS, ACSM CEP, Exercise Physiologist    Virtual Visit No    Medication changes reported     No    Fall or balance concerns reported    No    Warm-up and Cool-down Performed on first and last piece of equipment    Resistance Training Performed Yes    VAD Patient? No    PAD/SET Patient? No      Pain Assessment   Currently in Pain? No/denies                Social History   Tobacco Use  Smoking Status Former   Packs/day: 1.00   Years: 35.00   Total pack years: 35.00   Types: Cigarettes   Quit date: 12/16/2005   Years since quitting: 16.8   Passive exposure: Past  Smokeless Tobacco Current   Types: Chew  Tobacco Comments   pt states he very rarely uses smokeless tobacco     Goals Met:  Independence with exercise equipment Exercise tolerated well No report of concerns or symptoms today Strength training completed today  Goals Unmet:  Not Applicable  Comments: Pt able to follow exercise prescription today without complaint.  Will continue to monitor for progression.    Dr. Emily Filbert is Medical Director for [place].  Dr. Ottie Glazier is Medical Director for [facility] Pulmonary Rehabilitation.

## 2022-10-25 ENCOUNTER — Other Ambulatory Visit
Admission: RE | Admit: 2022-10-25 | Discharge: 2022-10-25 | Disposition: A | Discharge status: Home / Self Care | Payer: Medicare HMO | Source: Ambulatory Visit | Attending: Physician Assistant | Admitting: Physician Assistant

## 2022-10-25 ENCOUNTER — Encounter: Payer: Medicare HMO | Admitting: *Deleted

## 2022-10-25 DIAGNOSIS — I2510 Atherosclerotic heart disease of native coronary artery without angina pectoris: Secondary | ICD-10-CM | POA: Insufficient documentation

## 2022-10-25 DIAGNOSIS — N179 Acute kidney failure, unspecified: Secondary | ICD-10-CM | POA: Insufficient documentation

## 2022-10-25 DIAGNOSIS — I5022 Chronic systolic (congestive) heart failure: Secondary | ICD-10-CM

## 2022-10-25 DIAGNOSIS — I5020 Unspecified systolic (congestive) heart failure: Secondary | ICD-10-CM | POA: Insufficient documentation

## 2022-10-25 LAB — BASIC METABOLIC PANEL
Anion gap: 6 (ref 5–15)
BUN: 29 mg/dL — ABNORMAL HIGH (ref 8–23)
CO2: 21 mmol/L — ABNORMAL LOW (ref 22–32)
Calcium: 9.4 mg/dL (ref 8.9–10.3)
Chloride: 111 mmol/L (ref 98–111)
Creatinine, Ser: 1.37 mg/dL — ABNORMAL HIGH (ref 0.61–1.24)
GFR, Estimated: 55 mL/min — ABNORMAL LOW (ref >60)
Glucose, Bld: 113 mg/dL — ABNORMAL HIGH (ref 70–99)
Potassium: 5 mmol/L (ref 3.5–5.1)
Sodium: 138 mmol/L (ref 135–145)

## 2022-10-25 NOTE — Progress Notes (Signed)
Daily Session Note  Patient Details  Name: Stuart Harvey MRN: [id number] Date of Birth: 1951-11-07 Referring Provider:   [name] Pulmonary Rehab from 09/22/2022 in [facility] Cardiac and Pulmonary Rehab  Referring Provider [name], MD       Encounter Date: 10/25/2022  Check In:  Session Check In - 10/25/22 0954       Check-In   Supervising physician immediately available to respond to emergencies See telemetry face sheet for immediately available ER MD    Location [facility]-Cardiac & Pulmonary Rehab    Staff Present Renita Papa, RN Abel Presto, MS, ACSM CEP, Exercise Physiologist;Jessica Luan Pulling, MA, RCEP, CCRP, CCET    Virtual Visit No    Medication changes reported     No    Fall or balance concerns reported    No    Warm-up and Cool-down Performed on first and last piece of equipment    Resistance Training Performed Yes    VAD Patient? No    PAD/SET Patient? No      Pain Assessment   Currently in Pain? No/denies                Social History   Tobacco Use  Smoking Status Former   Packs/day: 1.00   Years: 35.00   Total pack years: 35.00   Types: Cigarettes   Quit date: 12/16/2005   Years since quitting: 16.8   Passive exposure: Past  Smokeless Tobacco Current   Types: Chew  Tobacco Comments   pt states he very rarely uses smokeless tobacco     Goals Met:  Independence with exercise equipment Exercise tolerated well No report of concerns or symptoms today Strength training completed today  Goals Unmet:  Not Applicable  Comments: Pt able to follow exercise prescription today without complaint.  Will continue to monitor for progression.    Dr. Emily Filbert is Medical Director for [place].  Dr. Ottie Glazier is Medical Director for [facility] Pulmonary Rehabilitation.

## 2022-10-26 ENCOUNTER — Ambulatory Visit
Admission: RE | Admit: 2022-10-26 | Discharge: 2022-10-26 | Disposition: A | Discharge status: Home / Self Care | Payer: Medicare HMO | Source: Ambulatory Visit | Attending: Unknown Physician Specialty | Admitting: Unknown Physician Specialty

## 2022-10-26 DIAGNOSIS — N644 Mastodynia: Secondary | ICD-10-CM

## 2022-10-27 ENCOUNTER — Encounter: Payer: Medicare HMO | Admitting: *Deleted

## 2022-10-27 ENCOUNTER — Other Ambulatory Visit: Payer: Self-pay | Admitting: *Deleted

## 2022-10-27 ENCOUNTER — Telehealth: Payer: Self-pay | Admitting: Physician Assistant

## 2022-10-27 DIAGNOSIS — I5022 Chronic systolic (congestive) heart failure: Secondary | ICD-10-CM

## 2022-10-27 MED ORDER — ENTRESTO 24-26 MG PO TABS: 1.0000 | ORAL_TABLET | Freq: Two times a day (BID) | ORAL | 3 refills | Status: DC

## 2022-10-27 NOTE — Telephone Encounter (Signed)
Stuart Mu, PA-C 10/25/2022 12:09 PM EST     Please inform the patient his kidney function has improved some following the discontinuation of SGLT2 inhibitor.  It does remain mildly elevated, though is consistent with prior readings.  Potassium is high normal.  Please ensure he is not using salt seasonings or salt substitutes that are high in potassium   Advised patient and wife. Wife stated they do use salt substitute and will stop

## 2022-10-27 NOTE — Telephone Encounter (Signed)
  Pt is returning cal to get lab result

## 2022-10-27 NOTE — Telephone Encounter (Signed)
Attempted to call patient just to make sure if he had any further questions. There was no answer so left voicemail message with instructions to call back if any further questions. Forms have been placed in my "Pending patient assistance" folder on the bottom of my cart. Patient only needs to bring in proof of income. Once we get that then we just need providers signature on our portion and then we can get that faxed to the company.

## 2022-10-27 NOTE — Progress Notes (Signed)
Daily Session Note  Patient Details  Name: Stuart Harvey MRN: [id number] Date of Birth: 02/12/1951 Referring Provider:   [name] [facility] Rehab from 09/22/2022 in [facility] Cardiac and Pulmonary Rehab  Referring Provider [name], MD       Encounter Date: 10/27/2022  Check In:  Session Check In - 10/27/22 1003       Check-In   Supervising physician immediately available to respond to emergencies See telemetry face sheet for immediately available ER MD    Location [facility]-Cardiac & Pulmonary Rehab    Staff Present Darlyne Russian, RN, ADN;Jessica Luan Pulling, MA, RCEP, CCRP, CCET;Melissa [place], RDN, LDN    Virtual Visit No    Medication changes reported     No    Fall or balance concerns reported    No    Warm-up and Cool-down Performed on first and last piece of equipment    Resistance Training Performed Yes    VAD Patient? No    PAD/SET Patient? No      Pain Assessment   Currently in Pain? No/denies                Social History   Tobacco Use  Smoking Status Former   Packs/day: 1.00   Years: 35.00   Total pack years: 35.00   Types: Cigarettes   Quit date: 12/16/2005   Years since quitting: 16.8   Passive exposure: Past  Smokeless Tobacco Current   Types: Chew  Tobacco Comments   pt states he very rarely uses smokeless tobacco     Goals Met:  Independence with exercise equipment Exercise tolerated well No report of concerns or symptoms today Strength training completed today  Goals Unmet:  Not Applicable  Comments: Pt able to follow exercise prescription today without complaint.  Will continue to monitor for progression.    Dr. Emily Filbert is Medical Director for [facility].  Dr. Ottie Glazier is Medical Director for [facility] Pulmonary Rehabilitation.

## 2022-10-27 NOTE — Telephone Encounter (Signed)
See telephone encounter.

## 2022-11-01 ENCOUNTER — Encounter: Payer: Medicare HMO | Admitting: *Deleted

## 2022-11-01 DIAGNOSIS — I5022 Chronic systolic (congestive) heart failure: Secondary | ICD-10-CM | POA: Diagnosis not present

## 2022-11-01 NOTE — Progress Notes (Signed)
Daily Session Note  Patient Details  Name: Stuart Harvey MRN: [id number] Date of Birth: [birth year] Referring Provider:   [name] [facility] Rehab from 09/22/2022 in [facility] Cardiac and Pulmonary Rehab  Referring Provider [name], MD       Encounter Date: 11/01/2022  Check In:  Session Check In - 11/01/22 1012       Check-In   Supervising physician immediately available to respond to emergencies See telemetry face sheet for immediately available ER MD    Location [facility]-Cardiac & Pulmonary Rehab    Staff Present Heath Lark, RN, BSN, CCRP;Jessica [place], MA, [place], CCRP, Bertram Gala, MS, ACSM CEP, Exercise Physiologist;Meredith Sherryll Burger, RN BSN    Virtual Visit No    Medication changes reported     No    Fall or balance concerns reported    No    Warm-up and Cool-down Performed on first and last piece of equipment    Resistance Training Performed Yes    VAD Patient? No    PAD/SET Patient? No      Pain Assessment   Currently in Pain? No/denies               Exercise Prescription Changes - 11/01/22 1000       Home Exercise Plan   Plans to continue exercise at Home (comment)   walking   Frequency Add 3 additional days to program exercise sessions.    Initial Home Exercises Provided 11/01/22             Social History   Tobacco Use  Smoking Status Former   Packs/day: 1.00   Years: 35.00   Total pack years: 35.00   Types: Cigarettes   Quit date: 12/16/2005   Years since quitting: 16.8   Passive exposure: Past  Smokeless Tobacco Current   Types: Chew  Tobacco Comments   pt states he very rarely uses smokeless tobacco     Goals Met:  Proper associated with RPD/PD & O2 Sat Independence with exercise equipment Exercise tolerated well No report of concerns or symptoms today  Goals Unmet:  Not Applicable  Comments: Pt able to follow exercise prescription today without complaint.  Will continue to monitor for progression.    Dr. Emily Filbert is Medical Director for [facility].  Dr. Ottie Glazier is Medical Director for [facility] Pulmonary Rehabilitation.

## 2022-11-03 ENCOUNTER — Telehealth: Payer: Self-pay | Admitting: Cardiovascular Disease

## 2022-11-03 ENCOUNTER — Encounter: Payer: Medicare HMO | Admitting: *Deleted

## 2022-11-03 DIAGNOSIS — I5022 Chronic systolic (congestive) heart failure: Secondary | ICD-10-CM

## 2022-11-03 NOTE — Telephone Encounter (Signed)
Patient is calling stating pulmonary rehab advised he call to inform Dr. Fletcher Anon that his BP has been ranging under 100 to see if medication changes need to be made. Please advise.

## 2022-11-03 NOTE — Telephone Encounter (Signed)
Patient went to pulmonary rehab and they checked his blood pressures three times. He could not remember the diastolic numbers but the systolic numbers were 96, 101, 198. He did not have any additional readings at this time. Inquired about if he was having any symptoms and he does have some mild dizziness but no other noticeable changes. He did want to know when he should go to the  ED if this becomes low. Encouraged that he please give our office a call as we have someone on call. Then reviewed strict ED precautions. Advised that I would send this message over to Dr. Rockey Situ for his review and any further recommendations. He verbalized understanding with no further questions at this time.

## 2022-11-03 NOTE — Progress Notes (Signed)
Daily Session Note  Patient Details  Name: Stuart Harvey MRN: [id number] Date of Birth: 07-02-1951 Referring Provider:   [name] Pulmonary Rehab from 09/22/2022 in [facility] Cardiac and Pulmonary Rehab  Referring Provider [name], MD       Encounter Date: 11/03/2022  Check In:  Session Check In - 11/03/22 1009       Check-In   Supervising physician immediately available to respond to emergencies See telemetry face sheet for immediately available ER MD    Location [facility]-Cardiac & Pulmonary Rehab    Staff Present Heath Lark, RN, BSN, CCRP;Joseph Hood, RCP,RRT,BSRT;Noah Tickle, [place], Exercise Physiologist    Virtual Visit No    Medication changes reported     No    Fall or balance concerns reported    No    Warm-up and Cool-down Performed on first and last piece of equipment    Resistance Training Performed Yes    VAD Patient? No    PAD/SET Patient? No      Pain Assessment   Currently in Pain? No/denies                Social History   Tobacco Use  Smoking Status Former   Packs/day: 1.00   Years: 35.00   Total pack years: 35.00   Types: Cigarettes   Quit date: 12/16/2005   Years since quitting: 16.8   Passive exposure: Past  Smokeless Tobacco Current   Types: Chew  Tobacco Comments   pt states he very rarely uses smokeless tobacco     Goals Met:  Independence with exercise equipment Exercise tolerated well No report of concerns or symptoms today  Goals Unmet:  Not Applicable  Comments: Pt able to follow exercise prescription today without complaint.  Will continue to monitor for progression.    Dr. Emily Filbert is Medical Director for [place].  Dr. Ottie Glazier is Medical Director for [facility] Pulmonary Rehabilitation.

## 2022-11-04 NOTE — Telephone Encounter (Signed)
The patient has been made aware and verbalized his understanding.  

## 2022-11-04 NOTE — Telephone Encounter (Signed)
No need to change anything in his medications if he is not having significant symptoms.  It is expected for his blood pressure to be on the low side due to reduced ejection fraction and the heart failure medications that we are giving to him.

## 2022-11-08 ENCOUNTER — Other Ambulatory Visit: Payer: Self-pay | Admitting: Nephrology

## 2022-11-08 DIAGNOSIS — N1831 Chronic kidney disease, stage 3a: Secondary | ICD-10-CM

## 2022-11-09 ENCOUNTER — Encounter: Payer: Self-pay | Admitting: *Deleted

## 2022-11-09 DIAGNOSIS — I5022 Chronic systolic (congestive) heart failure: Secondary | ICD-10-CM

## 2022-11-09 NOTE — Progress Notes (Signed)
Pulmonary Individual Treatment Plan  Patient Details  Name: FENDER HERDER MRN: [id number] Date of Birth: 04-21-1951 Referring Provider:   Flowsheet Row Pulmonary Rehab from 09/22/2022 in [facility] Cardiac and Pulmonary Rehab  Referring Provider [name], MD       Initial Encounter Date:  Flowsheet Row Pulmonary Rehab from 09/22/2022 in [facility] Cardiac and Pulmonary Rehab  Date 09/22/22       Visit Diagnosis: Heart failure, chronic systolic ([place])  Patient's Home Medications on Admission:  Current Outpatient Medications:    apixaban (ELIQUIS) 5 MG TABS tablet, Take 1 tablet (5 mg total) by mouth 2 (two) times daily., Disp: 180 tablet, Rfl: 1   buPROPion (WELLBUTRIN SR) 200 MG 12 hr tablet, Take 1 tablet (200 mg total) by mouth 2 (two) times daily., Disp: 180 tablet, Rfl: 0   Cholecalciferol 50 MCG (2000 UT) CAPS, Take 1 capsule by mouth daily. , Disp: , Rfl:    citalopram (CELEXA) 40 MG tablet, Take 1 tablet (40 mg total) by mouth daily., Disp: 90 tablet, Rfl: 1   ezetimibe (ZETIA) 10 MG tablet, Take 1 tablet (10 mg total) by mouth daily., Disp: 90 tablet, Rfl: 1   levothyroxine (SYNTHROID) 75 MCG tablet, Take 1 tablet (75 mcg total) by mouth daily., Disp: 90 tablet, Rfl: 0   metoprolol succinate (TOPROL XL) 25 MG 24 hr tablet, Take 1 tablet (25 mg total) by mouth daily., Disp: 90 tablet, Rfl: 3   omeprazole (PRILOSEC) 20 MG capsule, Take 1 capsule (20 mg total) by mouth daily., Disp: 90 capsule, Rfl: 0   rosuvastatin (CRESTOR) 40 MG tablet, Take 1 tablet (40 mg total) by mouth daily., Disp: 90 tablet, Rfl: 2   sacubitril-valsartan (ENTRESTO) 24-26 MG, Take 1 tablet by mouth 2 (two) times daily., Disp: 60 tablet, Rfl: 11  Past Medical History: Past Medical History:  Diagnosis Date   AAA (abdominal aortic aneurysm) ([place])    a.) s/p EVAR in 08/2007 by Dr. Hulda Humphrey. b.) CTA on 06/30/2021 --> interval aneurysmal dilitation superior to stent graft with the juxtarenal aorta measuring 3.8 cm.    Anemia    Arthritis    both knees   Chronic anticoagulation    Apixaban   CKD (chronic kidney disease), stage II    Coronary artery disease    a.) 4v CABG @ [place] 06/2007 (LIMA-LAD, VG-RPDA, VG-OM3,VG-D2); b.) LHC 2011: patent grafts EF 40%, c.) LHC 06/05/18: LM nl, pLAD 50%, p-mLAD 50%, ostD1 70%, ost ramus 30%, ost-pLCx 40%, pLCx 100% (chronically occluded), OM3 100%, p-dRCA 100% (chronically occluded), LIMA-LAD patent, VG-RPDA patent, VG-OM3 mid-graft 60% and 80%   Depression    GERD (gastroesophageal reflux disease)    Hernia of abdominal cavity    History of 2019 novel coronavirus disease (COVID-19) 09/2019   Hyperlipidemia    Hypertension    Hypothyroidism    Iliac artery aneurysm, right ([place])    a.) at the distal landing zone of the right iliac limb measuring 3 cm   Right middle lobe pulmonary nodule 06/30/2021   a.) measured 8 mm by CT on 06/30/2021.   S/P CABG x 4 06/25/2007   a.) LIMA-LAD, SVG-RPDA, SVG-OM3, SVG-D2   Sigmoid diverticulosis    Ventricular tachycardia ([place]) 05/2018   a.) found unresponsive by EMS; VT noted --> defibrillation achieved ROSC. Briefly intubated. Episode felt to be secondary to heat stoke.    Tobacco Use: Social History   Tobacco Use  Smoking Status Former   Packs/day: 1.00   Years: 35.00  Total pack years: 35.00   Types: Cigarettes   Quit date: 12/16/2005   Years since quitting: 16.9   Passive exposure: Past  Smokeless Tobacco Current   Types: Chew  Tobacco Comments   pt states he very rarely uses smokeless tobacco     Labs: Review Flowsheet  More data exists      Latest Ref Rng & Units 01/05/2021 02/18/2021 09/22/2021 03/29/2022 10/11/2022  Labs for ITP Cardiac and Pulmonary Rehab  Cholestrol 100 - 199 mg/dL 171  127  145  142  146   LDL (calc) 0 - 99 mg/dL 104  58  77  74  80   HDL-C >39 mg/dL 52  45  47  49  46   Trlycerides 0 - 149 mg/dL 83  138  114  105  112   Hemoglobin A1c 4.8 - 5.6 % 5.7  - - - -     Pulmonary  Assessment Scores:  Pulmonary Assessment Scores     Row Name 09/22/22 1338 09/27/22 1131       ADL UCSD   ADL Phase Entry Entry    SOB Score total -- 37    Rest -- 1    Walk -- 1    Stairs -- 2    Bath -- 1    Dress -- 1    Shop -- 2      CAT Score   CAT Score -- 13      mMRC Score   mMRC Score 1 --             UCSD: Self-administered rating of dyspnea associated with activities of daily living (ADLs) 6-point scale (0 = "not at all" to 5 = "maximal or unable to do because of breathlessness")  Scoring Scores range from 0 to 120.  Minimally important difference is 5 units  CAT: CAT can identify the health impairment of COPD patients and is better correlated with disease progression.  CAT has a scoring range of zero to 40. The CAT score is classified into four groups of low (less than 10), medium (10 - 20), high (21-30) and very high (31-40) based on the impact level of disease on health status. A CAT score over 10 suggests significant symptoms.  A worsening CAT score could be explained by an exacerbation, poor medication adherence, poor inhaler technique, or progression of COPD or comorbid conditions.  CAT MCID is 2 points  mMRC: mMRC (Modified Medical Research Council) Dyspnea Scale is used to assess the degree of baseline functional disability in patients of respiratory disease due to dyspnea. No minimal important difference is established. A decrease in score of 1 point or greater is considered a positive change.   Pulmonary Function Assessment:  Pulmonary Function Assessment - 09/12/22 1113       Breath   Shortness of Breath Yes;Limiting activity             Exercise Target Goals: Exercise Program Goal: Individual exercise prescription set using results from initial 6 min walk test and THRR while considering  patient's activity barriers and safety.   Exercise Prescription Goal: Initial exercise prescription builds to 30-45 minutes a day of aerobic  activity, 2-3 days per week.  Home exercise guidelines will be given to patient during program as part of exercise prescription that the participant will acknowledge.  Education: Aerobic Exercise: - Group verbal and visual presentation on the components of exercise prescription. Introduces F.I.T.T principle from ACSM for exercise prescriptions.  Reviews F.I.T.T.  principles of aerobic exercise including progression. Written material given at graduation. Flowsheet Row Pulmonary Rehab from 10/13/2022 in [facility] Cardiac and Pulmonary Rehab  Education need identified 09/22/22       Education: Resistance Exercise: - Group verbal and visual presentation on the components of exercise prescription. Introduces F.I.T.T principle from ACSM for exercise prescriptions  Reviews F.I.T.T. principles of resistance exercise including progression. Written material given at graduation. Flowsheet Row Pulmonary Rehab from 10/13/2022 in [facility] Cardiac and Pulmonary Rehab  Date 10/04/22  Educator NT  Instruction Review Code 1- Verbalizes Understanding        Education: Exercise & Equipment Safety: - Individual verbal instruction and demonstration of equipment use and safety with use of the equipment. Flowsheet Row Pulmonary Rehab from 10/13/2022 in [facility] Cardiac and Pulmonary Rehab  Date 09/12/22  Educator [facility]  Instruction Review Code 1- Verbalizes Understanding       Education: Exercise Physiology & General Exercise Guidelines: - Group verbal and written instruction with models to review the exercise physiology of the cardiovascular system and associated critical values. Provides general exercise guidelines with specific guidelines to those with heart or lung disease.    Education: Flexibility, Balance, Mind/Body Relaxation: - Group verbal and visual presentation with interactive activity on the components of exercise prescription. Introduces F.I.T.T principle from ACSM for exercise prescriptions. Reviews  F.I.T.T. principles of flexibility and balance exercise training including progression. Also discusses the mind body connection.  Reviews various relaxation techniques to help reduce and manage stress (i.e. Deep breathing, progressive muscle relaxation, and visualization). Balance handout provided to take home. Written material given at graduation. Flowsheet Row Pulmonary Rehab from 10/13/2022 in [facility] Cardiac and Pulmonary Rehab  Date 10/04/22  Educator NT  Instruction Review Code 1- Verbalizes Understanding       Activity Barriers & Risk Stratification:  Activity Barriers & Cardiac Risk Stratification - 09/22/22 1327       Activity Barriers & Cardiac Risk Stratification   Activity Barriers Left Knee Replacement;Right Hip Replacement;Other (comment);Shortness of Breath    Comments Hip Pain from Aneurysm    Cardiac Risk Stratification High             6 Minute Walk:  Oxygen Initial Assessment:  Oxygen Initial Assessment - 09/12/22 1112       Home Oxygen   Home Oxygen Device None    Sleep Oxygen Prescription None    Home Exercise Oxygen Prescription None    Home Resting Oxygen Prescription None      Initial 6 min Walk   Oxygen Used None      Program Oxygen Prescription   Program Oxygen Prescription None      Intervention   Short Term Goals To learn and understand importance of monitoring SPO2 with pulse oximeter and demonstrate accurate use of the pulse oximeter.;To learn and understand importance of maintaining oxygen saturations>88%;To learn and demonstrate proper pursed lip breathing techniques or other breathing techniques. ;To learn and exhibit compliance with exercise, home and travel O2 prescription    Long  Term Goals Verbalizes importance of monitoring SPO2 with pulse oximeter and return demonstration;Maintenance of O2 saturations>88%;Exhibits proper breathing techniques, such as pursed lip breathing or other method taught during program session;Compliance with  respiratory medication;Exhibits compliance with exercise, home  and travel O2 prescription             Oxygen Re-Evaluation:  Oxygen Re-Evaluation     Row Name 09/27/22 1042 10/27/22 1043  Program Oxygen Prescription   Program Oxygen Prescription None None        Home Oxygen   Home Oxygen Device None None      Sleep Oxygen Prescription None None      Home Exercise Oxygen Prescription None None      Home Resting Oxygen Prescription None None        Goals/Expected Outcomes   Short Term Goals To learn and demonstrate proper pursed lip breathing techniques or other breathing techniques.  To learn and demonstrate proper pursed lip breathing techniques or other breathing techniques. ;To learn and understand importance of monitoring SPO2 with pulse oximeter and demonstrate accurate use of the pulse oximeter.;To learn and understand importance of maintaining oxygen saturations>88%      Long  Term Goals Exhibits proper breathing techniques, such as pursed lip breathing or other method taught during program session Exhibits proper breathing techniques, such as pursed lip breathing or other method taught during program session;Verbalizes importance of monitoring SPO2 with pulse oximeter and return demonstration;Maintenance of O2 saturations>88%;Compliance with respiratory medication      Comments Reviewed PLB technique with pt.  Talked about how it works and it's importance in maintaining their exercise saturations. [name] is doing well in rehab.  He is doing well with using his PLB.  His saturations are doing well and he tries to keep any eye on them.  He feels that his breathing is starting to improve.      Goals/Expected Outcomes Short: Become more profiecient at using PLB. Long: Become independent at using PLB. Short: Continue to use PLB Long; Conitnue to work on breathing               Oxygen Discharge (Final Oxygen Re-Evaluation):  Oxygen Re-Evaluation - 10/27/22 1043        Program Oxygen Prescription   Program Oxygen Prescription None      Home Oxygen   Home Oxygen Device None    Sleep Oxygen Prescription None    Home Exercise Oxygen Prescription None    Home Resting Oxygen Prescription None      Goals/Expected Outcomes   Short Term Goals To learn and demonstrate proper pursed lip breathing techniques or other breathing techniques. ;To learn and understand importance of monitoring SPO2 with pulse oximeter and demonstrate accurate use of the pulse oximeter.;To learn and understand importance of maintaining oxygen saturations>88%    Long  Term Goals Exhibits proper breathing techniques, such as pursed lip breathing or other method taught during program session;Verbalizes importance of monitoring SPO2 with pulse oximeter and return demonstration;Maintenance of O2 saturations>88%;Compliance with respiratory medication    Comments [name] is doing well in rehab.  He is doing well with using his PLB.  His saturations are doing well and he tries to keep any eye on them.  He feels that his breathing is starting to improve.    Goals/Expected Outcomes Short: Continue to use PLB Long; Conitnue to work on breathing             Initial Exercise Prescription:  Initial Exercise Prescription - 09/22/22 1300       Date of Initial Exercise RX and Referring Provider   Date 09/22/22    Referring Provider [name], MD      Oxygen   Maintain Oxygen Saturation 88% or higher      Treadmill   MPH 2    Grade 0.5    Minutes 15    METs 2.67      Recumbant  Bike   Level 1    RPM 50    Watts 12    Minutes 15    METs 2.38      REL-XR   Level 1    Watts 12    Speed 50    Minutes 15    METs 2.38      Prescription Details   Frequency (times per week) 2    Duration Progress to 30 minutes of continuous aerobic without signs/symptoms of physical distress      Intensity   THRR 40-80% of Max Heartrate 99-134    Ratings of Perceived Exertion 11-13    Perceived  Dyspnea 0-4      Progression   Progression Continue to progress workloads to maintain intensity without signs/symptoms of physical distress.      Resistance Training   Training Prescription Yes    Weight 4 lb    Reps 10-15             Perform Capillary Blood Glucose checks as needed.  Exercise Prescription Changes:   Exercise Prescription Changes     Row Name 09/22/22 1300 10/10/22 1300 10/24/22 0900 11/01/22 1000 11/08/22 1300     Response to Exercise   Blood Pressure (Admit) 102/60 124/70 112/64 -- 98/56   Blood Pressure (Exercise) 118/76 142/74 146/72 -- 118/62   Blood Pressure (Exit) 108/68 108/70 100/64 -- 96/52   Heart Rate (Admit) 66 bpm 78 bpm 78 bpm -- 94 bpm   Heart Rate (Exercise) 88 bpm 101 bpm 90 bpm -- 130 bpm   Heart Rate (Exit) 69 bpm 79 bpm 83 bpm -- 80 bpm   Oxygen Saturation (Admit) 96 % 98 % 97 % -- 96 %   Oxygen Saturation (Exercise) 94 % 94 % 95 % -- 90 %   Oxygen Saturation (Exit) 97 % 96 % 96 % -- 98 %   Rating of Perceived Exertion (Exercise) _0 -- 12   Perceived Dyspnea (Exercise) _1 -- 1   Symptoms Hip Pain 4/10 Dizziness none -- none   Comments 6MWT Results First 3 sessions of rehab -- -- --   Duration -- Progress to 30 minutes of  aerobic without signs/symptoms of physical distress Continue with 30 min of aerobic exercise without signs/symptoms of physical distress. -- Continue with 30 min of aerobic exercise without signs/symptoms of physical distress.   Intensity -- THRR unchanged THRR unchanged -- THRR unchanged     Progression   Progression -- Continue to progress workloads to maintain intensity without signs/symptoms of physical distress. Continue to progress workloads to maintain intensity without signs/symptoms of physical distress. -- Continue to progress workloads to maintain intensity without signs/symptoms of physical distress.   Average METs -- 2.66 3.24 -- 3.07     Resistance Training   Training Prescription -- Yes Yes  -- Yes   Weight -- 4 lb\ 4 lb -- 6 lb   Reps -- 10-15 10-15 -- 10-15     Interval Training   Interval Training -- No No -- No     Treadmill   MPH -- 1.5 2.1 -- 2.1   Grade -- 0 0 -- 0.5   Minutes -- 15 15 -- 15   METs -- 2.15 2.61 -- 2.75     Recumbant Bike   Level -- -- 5 -- 3   Watts -- -- 33 -- 34   Minutes -- -- 15 -- 15   METs -- -- 2.39 -- --  NuStep   Level -- 3 -- -- 4   Minutes -- 15 -- -- 15   METs -- 2 -- -- 3.9     REL-XR   Level -- 1 2 -- 2   Minutes -- 15 15 -- 15   METs -- 3.9 4 -- 3.5     Home Exercise Plan   Plans to continue exercise at -- -- -- Home (comment)  walking Home (comment)  walking   Frequency -- -- -- Add 3 additional days to program exercise sessions. Add 3 additional days to program exercise sessions.   Initial Home Exercises Provided -- -- -- 11/01/22 11/01/22     Oxygen   Maintain Oxygen Saturation -- 88% or higher 88% or higher -- 88% or higher            Exercise Comments:   Exercise Comments     Row Name 09/27/22 1041           Exercise Comments First full day of exercise!  Patient was oriented to gym and equipment including functions, settings, policies, and procedures.  Patient's individual exercise prescription and treatment plan were reviewed.  All starting workloads were established based on the results of the 6 minute walk test done at initial orientation visit.  The plan for exercise progression was also introduced and progression will be customized based on patient's performance and goals.                Exercise Goals and Review:   Exercise Goals     Row Name 09/22/22 1334             Exercise Goals   Increase Physical Activity Yes       Intervention Provide advice, education, support and counseling about physical activity/exercise needs.;Develop an individualized exercise prescription for aerobic and resistive training based on initial evaluation findings, risk stratification, comorbidities and  participant's personal goals.       Expected Outcomes Short Term: Attend rehab on a regular basis to increase amount of physical activity.;Long Term: Add in home exercise to make exercise part of routine and to increase amount of physical activity.;Long Term: Exercising regularly at least 3-5 days a week.       Increase Strength and Stamina Yes       Intervention Provide advice, education, support and counseling about physical activity/exercise needs.;Develop an individualized exercise prescription for aerobic and resistive training based on initial evaluation findings, risk stratification, comorbidities and participant's personal goals.       Expected Outcomes Short Term: Increase workloads from initial exercise prescription for resistance, speed, and METs.;Short Term: Perform resistance training exercises routinely during rehab and add in resistance training at home;Long Term: Improve cardiorespiratory fitness, muscular endurance and strength as measured by increased METs and functional capacity (6MWT)       Able to understand and use rate of perceived exertion (RPE) scale Yes       Intervention Provide education and explanation on how to use RPE scale       Expected Outcomes Short Term: Able to use RPE daily in rehab to express subjective intensity level;Long Term:  Able to use RPE to guide intensity level when exercising independently       Able to understand and use Dyspnea scale Yes       Intervention Provide education and explanation on how to use Dyspnea scale       Expected Outcomes Short Term: Able to use Dyspnea scale daily in rehab  to express subjective sense of shortness of breath during exertion;Long Term: Able to use Dyspnea scale to guide intensity level when exercising independently       Knowledge and understanding of Target Heart Rate Range (THRR) Yes       Intervention Provide education and explanation of THRR including how the numbers were predicted and where they are located for  reference       Expected Outcomes Long Term: Able to use THRR to govern intensity when exercising independently;Short Term: Able to state/look up THRR;Short Term: Able to use daily as guideline for intensity in rehab       Able to check pulse independently Yes       Intervention Provide education and demonstration on how to check pulse in carotid and radial arteries.;Review the importance of being able to check your own pulse for safety during independent exercise       Expected Outcomes Short Term: Able to explain why pulse checking is important during independent exercise;Long Term: Able to check pulse independently and accurately       Understanding of Exercise Prescription Yes       Intervention Provide education, explanation, and written materials on patient's individual exercise prescription       Expected Outcomes Short Term: Able to explain program exercise prescription;Long Term: Able to explain home exercise prescription to exercise independently                Exercise Goals Re-Evaluation :  Exercise Goals Re-Evaluation     Row Name 09/27/22 1041 10/10/22 1340 10/24/22 0906 10/27/22 1033 11/01/22 1002     Exercise Goal Re-Evaluation   Exercise Goals Review Able to understand and use rate of perceived exertion (RPE) scale;Able to understand and use Dyspnea scale;Knowledge and understanding of Target Heart Rate Range (THRR);Understanding of Exercise Prescription Understanding of Exercise Prescription;Increase Physical Activity;Increase Strength and Stamina Understanding of Exercise Prescription;Increase Physical Activity;Increase Strength and Stamina Understanding of Exercise Prescription;Increase Physical Activity;Increase Strength and Stamina Understanding of Exercise Prescription;Increase Physical Activity;Increase Strength and Stamina;Able to understand and use rate of perceived exertion (RPE) scale;Knowledge and understanding of Target Heart Rate Range (THRR);Able to understand and  use Dyspnea scale;Able to check pulse independently   Comments Reviewed RPE scale, THR and program prescription with pt today.  Pt voiced understanding and was given a copy of goals to take home. [name] is off to a good start in rehab. He had an overall average MET level of 2.66 METs during his first three sessions. He also did well with level 3 on the T4 and level 1 on the XR. He started on the treadmill at 1.5 mph and no incline. We will continue to monitor his progress in the program. [name] continues to do well in rehab. He increased to level 5 on the recumbent bike and worked up to 33 watts! He was also able to increase his speed on the treadmill back up to 2.1 mph, which is closer to where his initial exercise prescription was. We will continue to monitor. [name] is doing well in rehab.  He is not really doing exercise at home. He is staying active by doing yard work. He does feel like his strength and stamina are getting better. Reviewed home exercise with pt today.  Pt plans to walk and use weights at home for exercise.  Reviewed THR, pulse, RPE, sign and symptoms, pulse oximetery and when to call 911 or MD.  Also discussed weather considerations and indoor options.  Pt voiced  understanding.   Expected Outcomes Short: Use RPE daily to regulate intensity. Long: Follow program prescription in THR. Short: increase speed on the treadmill. Long: Continue to increase strength and stamina. Short: Add on incline to treadmill now that speed is up Long: Continue to increase overall MET level Short: Start to add in more exercise at home Long: Continue to improve stamina Short: wallk more on off days Long: conitnue to exercise independently    [place] Name 11/08/22 1321             Exercise Goal Re-Evaluation   Exercise Goals Review Increase Physical Activity;Increase Strength and Stamina;Understanding of Exercise Prescription       Comments [name] is doing well in rehab. He has continued to consistently work at a  MET level above 3 METs. He has also added incline on the treadmill at 0.5% while maintaining a speed of 2.1 mph. He improved to level 4 on the T4, and has continued to do well with level 2 on the XR. We will continue to monitor his progress.       Expected Outcomes Short: Continue to increase workload on the treadmill. Long: Continue to improve strength and stamina.                Discharge Exercise Prescription (Final Exercise Prescription Changes):  Exercise Prescription Changes - 11/08/22 1300       Response to Exercise   Blood Pressure (Admit) 98/56    Blood Pressure (Exercise) 118/62    Blood Pressure (Exit) 96/52    Heart Rate (Admit) 94 bpm    Heart Rate (Exercise) 130 bpm    Heart Rate (Exit) 80 bpm    Oxygen Saturation (Admit) 96 %    Oxygen Saturation (Exercise) 90 %    Oxygen Saturation (Exit) 98 %    Rating of Perceived Exertion (Exercise) 12    Perceived Dyspnea (Exercise) 1    Symptoms none    Duration Continue with 30 min of aerobic exercise without signs/symptoms of physical distress.    Intensity THRR unchanged      Progression   Progression Continue to progress workloads to maintain intensity without signs/symptoms of physical distress.    Average METs 3.07      Resistance Training   Training Prescription Yes    Weight 6 lb    Reps 10-15      Interval Training   Interval Training No      Treadmill   MPH 2.1    Grade 0.5    Minutes 15    METs 2.75      Recumbant Bike   Level 3    Watts 34    Minutes 15      NuStep   Level 4    Minutes 15    METs 3.9      REL-XR   Level 2    Minutes 15    METs 3.5      Home Exercise Plan   Plans to continue exercise at Home (comment)   walking   Frequency Add 3 additional days to program exercise sessions.    Initial Home Exercises Provided 11/01/22      Oxygen   Maintain Oxygen Saturation 88% or higher             Nutrition:  Target Goals: Understanding of nutrition guidelines, daily intake  of sodium <1556m, cholesterol <2031m calories 30% from fat and 7% or less from saturated fats, daily to have 5 or more servings  of fruits and vegetables.  Education: All About Nutrition: -Group instruction provided by verbal, written material, interactive activities, discussions, models, and posters to present general guidelines for heart healthy nutrition including fat, fiber, MyPlate, the role of sodium in heart healthy nutrition, utilization of the nutrition label, and utilization of this knowledge for meal planning. Follow up email sent as well. Written material given at graduation. Flowsheet Row Pulmonary Rehab from 10/13/2022 in [facility]  Education need identified 09/22/22  Date 10/13/22  [Nutrition 2]  Educator [facility]  Instruction Review Code 1- Verbalizes Understanding       Biometrics:  Pre Biometrics - 09/22/22 1334       Pre Biometrics   Height _0  (1.803 m)    Weight 216 lb 3.2 oz (98.1 kg)    BMI (Calculated) 30.17    Single Leg Stand 5 seconds   R             Nutrition Therapy Plan and Nutrition Goals:  Nutrition Therapy & Goals - 09/22/22 1049       Nutrition Therapy   Diet Heart healthy, low Na, CKD stg 3 MNT    Drug/Food Interactions Statins/Certain Fruits    Protein (specify units) 85-95g    Fiber 30 grams    Whole Grain Foods 3 servings   Depending on labwork   Saturated Fats 16 max. grams    Fruits and Vegetables 8 servings/day    Sodium 1.5 grams      Personal Nutrition Goals   Nutrition Goal ST: practice MyPlate guidelines, focus on variety of plant foods LT: limit saturated fat <16g/day, limit sodium intake <1.5g/day, eat at least 30g of fiber per day, alter potassium and phosphorus intake based of of labwork for CKD.    Comments 71 y.o. M admitted to pulmonary rehab for chronic HF. PMHx includes CAD, MI 2008, HLD, HTN, CKD stg 3, AAA s/p EVAR (2008), paroxysmal A-fib, HFrEF, hypothyroidism, current tobacco use (dip),  arthritis, depression, GERD, diverticulosis. PSHx includes CABG 2008, EVAR 2008 and 2014, coronary/graft angiography 2019, endovascular/stent graft 2022, bilateral TKA 2017. Relevant medications includes bupropion, vit D3, citalopram, synthroid, melatonin, omeprazole, rosuvastatin. Last GFR 08/29/22 was 42. PYP Score: 56. Vegetables & Fruits 5/12. Breads, Grains & Cereals 5/12. Red & Processed Meat 7/12. Poultry 2/2. Fish & Shellfish 0/4. Beans, Nuts & Seeds 3/4. Milk & Dairy Foods 3/6. Toppings, Oils, Seasonings & Salt 17/20. Sweets, Snacks & Restaurant Food 7/14. Beverages 7/10. He reports watching sodium B: does not usually have breakfast L: low sodium ham/turkey on sandwich (white bread) D: pork chops, chicken, spaghetti, roast beef. he likes to have baked potatoes and white beans. He reports having red meat <2x/week. He rpeorts his wife will cook dinner. He reports his wife uses liquid plant oils. Drinks: 2 cups of coffee (2 sugars), water, sometimes sweet tea or pepsi (1-2x/day). He reports having salad every meal (cucumbers, lettuce, tomato, some cheese, [place] (2 tbsp). He reports that his wife is a great support system for him and makes sure to watch what he eats. Discussed heart healthy eating and CKD stg 3 MNT.      Intervention Plan   Intervention Prescribe, educate and counsel regarding individualized specific dietary modifications aiming towards targeted core components such as weight, hypertension, lipid management, diabetes, heart failure and other comorbidities.;Nutrition handout(s) given to patient.    Expected Outcomes Short Term Goal: Understand basic principles of dietary content, such as calories, fat, sodium, cholesterol and nutrients.;Short Term  Goal: A plan has been developed with personal nutrition goals set during dietitian appointment.;Long Term Goal: Adherence to prescribed nutrition plan.             Nutrition Assessments:  MEDIFICTS Score Key: ?70 Need to make  dietary changes  40-70 Heart Healthy Diet ? 40 Therapeutic Level Cholesterol Diet  Flowsheet Row Pulmonary Rehab from 09/22/2022 in [facility] Cardiac and Pulmonary Rehab  Picture Your Plate Total Score on Admission 56      Picture Your Plate Scores: <27 Unhealthy dietary pattern with much room for improvement. 41-50 Dietary pattern unlikely to meet recommendations for good health and room for improvement. 51-60 More healthful dietary pattern, with some room for improvement.  >60 Healthy dietary pattern, although there may be some specific behaviors that could be improved.   Nutrition Goals Re-Evaluation:  Nutrition Goals Re-Evaluation     [place] Name 10/27/22 1040             Goals   Nutrition Goal ST: practice MyPlate guidelines, focus on variety of plant foods LT: limit saturated fat <16g/day, limit sodium intake <1.5g/day, eat at least 30g of fiber per day, alter potassium and phosphorus intake based of of labwork for CKD.       Comment [name] is doing well in rehab.  His diet is going well and he is getting in a good variety.  He is trying to wathin his sodium and sugar intake.  His wife is helping keep him on task with diet.       Expected Outcome Short: Continue to use MyPlate guidelines Long: Continue to get a good variety                Nutrition Goals Discharge (Final Nutrition Goals Re-Evaluation):  Nutrition Goals Re-Evaluation - 10/27/22 1040       Goals   Nutrition Goal ST: practice MyPlate guidelines, focus on variety of plant foods LT: limit saturated fat <16g/day, limit sodium intake <1.5g/day, eat at least 30g of fiber per day, alter potassium and phosphorus intake based of of labwork for CKD.    Comment [name] is doing well in rehab.  His diet is going well and he is getting in a good variety.  He is trying to wathin his sodium and sugar intake.  His wife is helping keep him on task with diet.    Expected Outcome Short: Continue to use MyPlate guidelines Long:  Continue to get a good variety             Psychosocial: Target Goals: Acknowledge presence or absence of significant depression and/or stress, maximize coping skills, provide positive support system. Participant is able to verbalize types and ability to use techniques and skills needed for reducing stress and depression.   Education: Stress, Anxiety, and Depression - Group verbal and visual presentation to define topics covered.  Reviews how body is impacted by stress, anxiety, and depression.  Also discusses healthy ways to reduce stress and to treat/manage anxiety and depression.  Written material given at graduation.   Education: Sleep Hygiene -Provides group verbal and written instruction about how sleep can affect your health.  Define sleep hygiene, discuss sleep cycles and impact of sleep habits. Review good sleep hygiene tips.    Initial Review & Psychosocial Screening:  Initial Psych Review & Screening - 09/12/22 1114       Initial Review   Current issues with Current Psychotropic Meds;History of Depression      Family Dynamics   Good  Support System? Yes    Comments [name] can look to his wife for suppprt. He feels concerned for his health since he also has a kidney disease.      Barriers   Psychosocial barriers to participate in program The patient should benefit from training in stress management and relaxation.      Screening Interventions   Interventions Encouraged to exercise;To provide support and resources with identified psychosocial needs;Provide feedback about the scores to participant    Expected Outcomes Short Term goal: Utilizing psychosocial counselor, staff and physician to assist with identification of specific Stressors or current issues interfering with healing process. Setting desired goal for each stressor or current issue identified.;Long Term Goal: Stressors or current issues are controlled or eliminated.;Short Term goal: Identification and review with  participant of any Quality of Life or Depression concerns found by scoring the questionnaire.;Long Term goal: The participant improves quality of Life and PHQ9 Scores as seen by post scores and/or verbalization of changes             Quality of Life Scores:  Quality of Life - 09/22/22 1335       Quality of Life   Select Quality of Life      Quality of Life Scores   Health/Function Pre 20.27 %    Socioeconomic Pre 22.17 %    Psych/Spiritual Pre 22.93 %    Family Pre 28.5 %    GLOBAL Pre 22.42 %            Scores of 19 and below usually indicate a poorer quality of life in these areas.  A difference of  2-3 points is a clinically meaningful difference.  A difference of 2-3 points in the total score of the Quality of Life Index has been associated with significant improvement in overall quality of life, self-image, physical symptoms, and general health in studies assessing change in quality of life.  PHQ-9: Review Flowsheet  More data exists      10/11/2022 09/22/2022 08/09/2022 09/29/2021 08/06/2021  Depression screen PHQ 2/9  Decreased Interest 1 0 0 0 0  Down, Depressed, Hopeless 0 0 0 0 0  PHQ - 2 Score 1 0 0 0 0  Altered sleeping 1 1 0 0 -  Tired, decreased energy _0 -  Change in appetite 0 0 1 0 -  Feeling bad or failure about yourself  0 0 0 0 -  Trouble concentrating 1 1 0 0 -  Moving slowly or fidgety/restless 0 0 0 0 -  Suicidal thoughts 0 0 0 0 -  PHQ-9 Score _1 -  Difficult doing work/chores Not difficult at all Somewhat difficult Not difficult at all Not difficult at all -   Interpretation of Total Score  Total Score Depression Severity:  1-4 = Minimal depression, 5-9 = Mild depression, 10-14 = Moderate depression, 15-19 = Moderately severe depression, 20-27 = Severe depression   Psychosocial Evaluation and Intervention:  Psychosocial Evaluation - 09/12/22 1115       Psychosocial Evaluation & Interventions   Interventions Encouraged to  exercise with the program and follow exercise prescription;Relaxation education;Stress management education    Comments [name] can look to his wife for suppprt. He feels concerned for his health since he also has a kidney disease.    Expected Outcomes Short: Start [facility] to help with mood. Long: Maintain a healthy mental state    Continue Psychosocial Services  Follow up required by staff  Psychosocial Re-Evaluation:  Psychosocial Re-Evaluation     [place] Name 10/27/22 1037             Psychosocial Re-Evaluation   Current issues with Current Stress Concerns       Comments [name] is doing well in rehab.  He had a tough week as he was awaiting results from his PET scan but her got good results and they did not find any cancer!!  He was very excited with these results!  He continues to sleep well ad has a good support system at home.       Expected Outcomes Short: Continue to exercise for mental boost long; Continue to stay positive       Interventions Encouraged to attend Pulmonary Rehabilitation for the exercise       Continue Psychosocial Services  Follow up required by staff                Psychosocial Discharge (Final Psychosocial Re-Evaluation):  Psychosocial Re-Evaluation - 10/27/22 1037       Psychosocial Re-Evaluation   Current issues with Current Stress Concerns    Comments [name] is doing well in rehab.  He had a tough week as he was awaiting results from his PET scan but her got good results and they did not find any cancer!!  He was very excited with these results!  He continues to sleep well ad has a good support system at home.    Expected Outcomes Short: Continue to exercise for mental boost long; Continue to stay positive    Interventions Encouraged to attend Pulmonary Rehabilitation for the exercise    Continue Psychosocial Services  Follow up required by staff             Education: Education Goals: Education classes will be provided on a  weekly basis, covering required topics. Participant will state understanding/return demonstration of topics presented.  Learning Barriers/Preferences:  Learning Barriers/Preferences - 09/12/22 1113       Learning Barriers/Preferences   Learning Barriers None    Learning Preferences None             General Pulmonary Education Topics:  Infection Prevention: - Provides verbal and written material to individual with discussion of infection control including proper hand washing and proper equipment cleaning during exercise session. Flowsheet Row Pulmonary Rehab from 10/13/2022 in [facility] Cardiac and Pulmonary Rehab  Date 09/12/22  Educator [facility]  Instruction Review Code 1- Verbalizes Understanding       Falls Prevention: - Provides verbal and written material to individual with discussion of falls prevention and safety. Flowsheet Row Pulmonary Rehab from 10/13/2022 in [facility] Cardiac and Pulmonary Rehab  Date 09/12/22  Educator [facility]  Instruction Review Code 1- Verbalizes Understanding       Chronic Lung Disease Review: - Group verbal instruction with posters, models, PowerPoint presentations and videos,  to review new updates, new respiratory medications, new advancements in procedures and treatments. Providing information on websites and "800" numbers for continued self-education. Includes information about supplement oxygen, available portable oxygen systems, continuous and intermittent flow rates, oxygen safety, concentrators, and Medicare reimbursement for oxygen. Explanation of Pulmonary Drugs, including class, frequency, complications, importance of spacers, rinsing mouth after steroid MDI's, and proper cleaning methods for nebulizers. Review of basic lung anatomy and physiology related to function, structure, and complications of lung disease. Review of risk factors. Discussion about methods for diagnosing sleep apnea and types of masks and machines for OSA. Includes a review of the  use  of types of environmental controls: home humidity, furnaces, filters, dust mite/pet prevention, HEPA vacuums. Discussion about weather changes, air quality and the benefits of nasal washing. Instruction on Warning signs, infection symptoms, calling MD promptly, preventive modes, and value of vaccinations. Review of effective airway clearance, coughing and/or vibration techniques. Emphasizing that all should Create an Action Plan. Written material given at graduation.   AED/CPR: - Group verbal and written instruction with the use of models to demonstrate the basic use of the AED with the basic ABC's of resuscitation.    Anatomy and Cardiac Procedures: - Group verbal and visual presentation and models provide information about basic cardiac anatomy and function. Reviews the testing methods done to diagnose heart disease and the outcomes of the test results. Describes the treatment choices: Medical Management, Angioplasty, or Coronary Bypass Surgery for treating various heart conditions including Myocardial Infarction, Angina, Valve Disease, and Cardiac Arrhythmias.  Written material given at graduation. Flowsheet Row Pulmonary Rehab from 10/13/2022 in [facility] Cardiac and Pulmonary Rehab  Education need identified 09/22/22       Medication Safety: - Group verbal and visual instruction to review commonly prescribed medications for heart and lung disease. Reviews the medication, class of the drug, and side effects. Includes the steps to properly store meds and maintain the prescription regimen.  Written material given at graduation.   Other: -Provides group and verbal instruction on various topics (see comments)   Knowledge Questionnaire Score:  Knowledge Questionnaire Score - 09/22/22 1336       Knowledge Questionnaire Score   Pre Score 22/26              Core Components/Risk Factors/Patient Goals at Admission:  Personal Goals and Risk Factors at Admission - 09/22/22 1336        Core Components/Risk Factors/Patient Goals on Admission    Weight Management Yes;Weight Maintenance    Intervention Weight Management: Develop a combined nutrition and exercise program designed to reach desired caloric intake, while maintaining appropriate intake of nutrient and fiber, sodium and fats, and appropriate energy expenditure required for the weight goal.;Weight Management: Provide education and appropriate resources to help participant work on and attain dietary goals.;Weight Management/Obesity: Establish reasonable short term and long term weight goals.    Admit Weight 216 lb 3.2 oz (98.1 kg)    Goal Weight: Short Term 210 lb (95.3 kg)    Goal Weight: Long Term 200 lb (90.7 kg)    Expected Outcomes Weight Maintenance: Understanding of the daily nutrition guidelines, which includes 25-35% calories from fat, 7% or less cal from saturated fats, less than 257m cholesterol, less than 1.5gm of sodium, & 5 or more servings of fruits and vegetables daily;Long Term: Adherence to nutrition and physical activity/exercise program aimed toward attainment of established weight goal;Short Term: Continue to assess and modify interventions until short term weight is achieved;Understanding recommendations for meals to include 15-35% energy as protein, 25-35% energy from fat, 35-60% energy from carbohydrates, less than 203mof dietary cholesterol, 20-35 gm of total fiber daily;Understanding of distribution of calorie intake throughout the day with the consumption of 4-5 meals/snacks    Improve shortness of breath with ADL's Yes    Intervention Provide education, individualized exercise plan and daily activity instruction to help decrease symptoms of SOB with activities of daily living.    Expected Outcomes Short Term: Improve cardiorespiratory fitness to achieve a reduction of symptoms when performing ADLs;Long Term: Be able to perform more ADLs without symptoms or delay the onset of symptoms  Hypertension  Yes    Intervention Provide education on lifestyle modifcations including regular physical activity/exercise, weight management, moderate sodium restriction and increased consumption of fresh fruit, vegetables, and low fat dairy, alcohol moderation, and smoking cessation.;Monitor prescription use compliance.    Expected Outcomes Short Term: Continued assessment and intervention until BP is < 140/25m HG in hypertensive participants. < 130/840mHG in hypertensive participants with diabetes, heart failure or chronic kidney disease.;Long Term: Maintenance of blood pressure at goal levels.    Lipids Yes    Intervention Provide education and support for participant on nutrition & aerobic/resistive exercise along with prescribed medications to achieve LDL <7034mHDL >40m38m  Expected Outcomes Short Term: Participant states understanding of desired cholesterol values and is compliant with medications prescribed. Participant is following exercise prescription and nutrition guidelines.;Long Term: Cholesterol controlled with medications as prescribed, with individualized exercise RX and with personalized nutrition plan. Value goals: LDL < 70mg33mL > 40 mg.             Education:Diabetes - Individual verbal and written instruction to review signs/symptoms of diabetes, desired ranges of glucose level fasting, after meals and with exercise. Acknowledge that pre and post exercise glucose checks will be done for 3 sessions at entry of program.   Know Your Numbers and Heart Failure: - Group verbal and visual instruction to discuss disease risk factors for cardiac and pulmonary disease and treatment options.  Reviews associated critical values for Overweight/Obesity, Hypertension, Cholesterol, and Diabetes.  Discusses basics of heart failure: signs/symptoms and treatments.  Introduces Heart Failure Zone chart for action plan for heart failure.  Written material given at graduation.   Core Components/Risk  Factors/Patient Goals Review:   Goals and Risk Factor Review     Row Name 10/27/22 1041             Core Components/Risk Factors/Patient Goals Review   Personal Goals Review Weight Management/Obesity;Increase knowledge of respiratory medications and ability to use respiratory devices properly.;Hypertension;Improve shortness of breath with ADL's       Review AllenZenia Residesoing well in rehab.  His weight is steady.  Pressures are doing well, they have always been on the lower end of normal. His breathing is doing okay.  It has gotten better since starting.  He is doing well with his meds.       Expected Outcomes SHort: Conitnue to keep close eye on BP to not get too low Long; Conitnue to montior risk factors                Core Components/Risk Factors/Patient Goals at Discharge (Final Review):   Goals and Risk Factor Review - 10/27/22 1041       Core Components/Risk Factors/Patient Goals Review   Personal Goals Review Weight Management/Obesity;Increase knowledge of respiratory medications and ability to use respiratory devices properly.;Hypertension;Improve shortness of breath with ADL's    Review AllenZenia Residesoing well in rehab.  His weight is steady.  Pressures are doing well, they have always been on the lower end of normal. His breathing is doing okay.  It has gotten better since starting.  He is doing well with his meds.    Expected Outcomes SHort: Conitnue to keep close eye on BP to not get too low Long; Conitnue to montior risk factors             ITP Comments:  ITP Comments     Row Name 09/12/22 1111 09/22/22 1105 09/27/22 1041 10/12/22 0818 11/09/22 1031  ITP Comments Virtual Visit completed. Patient informed on EP and RD appointment and 6 Minute walk test. Patient also informed of patient health questionnaires on My Chart. Patient Verbalizes understanding. Visit diagnosis can be found in [facility] 08/26/2022. Completed 6MWT and gym orientation. Initial ITP created and sent for  review to Dr. Emily Filbert, Medical Director. First full day of exercise!  Patient was oriented to gym and equipment including functions, settings, policies, and procedures.  Patient's individual exercise prescription and treatment plan were reviewed.  All starting workloads were established based on the results of the 6 minute walk test done at initial orientation visit.  The plan for exercise progression was also introduced and progression will be customized based on patient's performance and goals. 30 Day review completed. Medical Director ITP review done, changes made as directed, and signed approval by Medical Director.   New to program 30 Day review completed. Medical Director ITP review done, changes made as directed, and signed approval by Medical Director.            Comments:

## 2022-11-10 ENCOUNTER — Encounter: Payer: Medicare HMO | Admitting: *Deleted

## 2022-11-10 ENCOUNTER — Other Ambulatory Visit: Payer: Self-pay | Admitting: Nurse Practitioner

## 2022-11-10 ENCOUNTER — Telehealth: Payer: Self-pay | Admitting: Cardiovascular Disease

## 2022-11-10 DIAGNOSIS — I5022 Chronic systolic (congestive) heart failure: Secondary | ICD-10-CM

## 2022-11-10 MED ORDER — METOPROLOL SUCCINATE ER 25 MG PO TB24: 12.5000 mg | ORAL_TABLET | Freq: Every day | ORAL | 3 refills | Status: DC

## 2022-11-10 NOTE — Telephone Encounter (Signed)
Patient came in with light headedness, low BP spoke to [facility] for assistance.

## 2022-11-10 NOTE — Telephone Encounter (Signed)
Low blood pressure may be in the setting of recently initiated Entresto.  We may have to transition him off Entresto and onto losartan.  However, if possible I would like to maintain him on Entresto.  Please have him decrease Toprol-XL to 12.5 mg daily.  Please have him update [place] in 1 to 2 weeks with BP readings, sooner if symptomatic.  If he continues to be symptomatic, or if blood pressure readings remain soft we will need to transition off Entresto.  ED precautions.

## 2022-11-10 NOTE — Telephone Encounter (Signed)
Rehab brought patient to the office for low blood pressures and some lightheadedness. Today at rehab his blood pressures were 84/56, 88/58, 84/58, 96/56. They also brought list of the readings he has previously had been when he comes into rehab. Spoke with patient and he states that he only has a little lightheadedness at the moment. They wanted him to be seen today and advised we do not have appointment for today. I also advised cardiac rehab and that we do not take walk in's. Reviewed that I would forward this information over to provider for his review and would give him a call back with his recommendations.

## 2022-11-10 NOTE — Telephone Encounter (Signed)
Left voicemail message to call back for review of recommendations.  

## 2022-11-10 NOTE — Progress Notes (Signed)
Incomplete Session Note  Patient Details  Name: Stuart Harvey MRN: [id number] Date of Birth: [birth year] Referring Provider:   [name] from 09/22/2022 in [facility]  Referring Provider [name], MD       [name] did not complete his rehab session.  Pt arrived to rehab today with baseline systolic blood pressure low 80s. Pt given water and BP rechecked at 96/56. Pt symptomatic with c/o dizziness. Pt with BP trending lower upon arrival at rehab. Pt reported after taking AM medications he continues to have dizziness. Pt escorted to cardiologist via wheelchair by rehab staff.

## 2022-11-10 NOTE — Telephone Encounter (Signed)
Spoke with patients wife per release form. Reviewed recommendations with her and she wrote them down and confirmed appointment next week. Instructed her to have him monitor his blood pressures and keep a log of those readings for his appointment. Also instructed her to have him cut the Toprol XL 25 mg in half to 12.5 mg. She wrote down all instructions, repeated them back, and confirmed appointment for next week. She verbalized understanding of our conversation with no further questions at this time.

## 2022-11-12 NOTE — Telephone Encounter (Signed)
Requested Prescriptions  Pending Prescriptions Disp Refills   citalopram (CELEXA) 40 MG tablet [Pharmacy Med Name: CITALOPRAM HBR 40 MG TABLET] 90 tablet 0    Sig: Take 1 tablet (40 mg total) by mouth daily.     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 [place]   In 2 months Cannady, Barbaraann Faster, NP [employer], [facility]

## 2022-11-15 NOTE — Progress Notes (Unsigned)
Cardiology Office Note    Date:  11/16/2022   ID:  DONSHAY Harvey, DOB 1951/08/19, MRN [id number]  PCP:  Venita Lick, NP  Cardiologist:  [name], MD  Electrophysiologist:  None   Chief Complaint: Follow-up  History of Present Illness:   Stuart Harvey is a 72 y.o. male with history of CAD s/p 4-vessel CABG in 06/2007 at [facility], HFrEF, large infrarenal AAA s/p open surgical repair in 08/2007, right iliac artery aneurysm status post endovascular repair in 07/2021, PAF on Eliquis, AKI, HTN, HLD, hypothyroidism, anemia, depression, and GERD who presents for follow-up of his CAD and A-fib.   He was hospitalized in 05/2018 with unresponsiveness.  He was reportedly down for 1 hour with possible seizure-like activities.  He had numerous contusions.  Per EMS, he was in ventricular tachycardia upon arrival and was cardioverted.  He was briefly intubated and found to have mildly elevated troponin.  CPK was close to 4000.  Echo showed an EF of 50 to 55%.  LHC showed severe underlying three-vessel CAD with patent grafts including LIMA to LAD, SVG to RCA, SVG to diagonal, and SVG to OM3.  There was borderline disease in the SVG to OM, though that was left to be treated medically given it was supplying a small territory.  While admitted, he developed A-fib and converted spontaneously.  His presentation was felt to be triggered by heat stroke.   He was seen in the office in 06/2022 with worsening exertional dyspnea and occasional wheezing as well as some episodes of left-sided chest discomfort.  Subsequent Lexiscan MPI on 06/23/2022 was without evidence of ischemia with prior evidence of infarct as outlined below.  LVEF was moderately reduced with a calculated EF of 38%.  Overall, this study showed findings were consistent with prior MI and was intermediate risk.  Subsequent echo on 07/20/2022 demonstrated an EF of 35 to 40%, global hypokinesis, mild LVH, grade 1 diastolic dysfunction, low normal RV systolic  function, trivial mitral regurgitation, aortic valve sclerosis without evidence of stenosis, trivial aortic insufficiency, mildly dilated aortic root measuring 43 mm, mildly dilated ascending aorta measuring 42 mm, and an estimated right atrial pressure of 3 mmHg.  Given symptoms and the above abnormalities noted on workup, he underwent R/LHC on 08/05/2022 which showed significant three-vessel CAD with patent grafts including LIMA to LAD, SVG to diagonal, and SVG to RPDA.  There was new occlusion of the SVG to OM when compared to most recent cardiac cath.  However, this was not unexpected given the graft was diffusely diseased.  RHC showed normal right and left-sided filling pressures, normal pulmonary pressures, and normal cardiac output.  Medical therapy and GDMT was recommended.  In follow-up on 08/18/2022, he had stable shortness of breath.  He was a started on Farxiga to further optimize GDMT.  With mild renal dysfunction noted on subsequent labs, he was referred to nephrology.  He was last seen on 10/14/2022 and was without symptoms of angina or decompensation with stable chronic dyspnea and fatigue.  With previously noted AKI, Wilder Glade was discontinued and he was initiated on Entresto.  We were notified by pulmonary rehab he had some episodes of hypotension in the 29N to 98X systolic.  In this setting, Toprol-XL was reduced to 12.5 mg daily.  He comes in accompanied by his wife and is doing well from a cardiac perspective, without symptoms of angina or decompensation.  Since decreasing Toprol-XL he has noted an improvement in his dizziness.  Home  BP readings with morning readings occurring approximately 2 hours after morning medications have improved into the 1 teens to 120s.  No further hypotensive BP readings.  No syncope.  No progressive lower extremity swelling or orthopnea.  He also reports an episode of bilateral upper extremity weakness and dysarthria on 11/02/2022.  Symptoms lasted for 1 minute to 1.5  minutes and spontaneously resolved.  He has not had further symptoms.   Labs independently reviewed: 10/2022 - BUN 29, serum creatinine 1.41, potassium 4.7, albumin 4.2, Hgb 14.6, PLT 174 09/2022 - TC 146, TG 112, HDL 46, LDL 80, Hgb 14.8, PLT 156 08/2022 - potassium 4.8, BUN 31, serum creatinine 1.71 03/2022 - AST/ALT normal, TSH normal 12/2020 - A1c 5.7  Past Medical History:  Diagnosis Date   AAA (abdominal aortic aneurysm) ([place])    a.) s/p EVAR in 08/2007 by Dr. Hulda Humphrey. b.) CTA on 06/30/2021 --> interval aneurysmal dilitation superior to stent graft with the juxtarenal aorta measuring 3.8 cm.   Anemia    Arthritis    both knees   Chronic anticoagulation    Apixaban   CKD (chronic kidney disease), stage II    Coronary artery disease    a.) 4v CABG @ [place] 06/2007 (LIMA-LAD, VG-RPDA, VG-OM3,VG-D2); b.) LHC 2011: patent grafts EF 40%, c.) LHC 06/05/18: LM nl, pLAD 50%, p-mLAD 50%, ostD1 70%, ost ramus 30%, ost-pLCx 40%, pLCx 100% (chronically occluded), OM3 100%, p-dRCA 100% (chronically occluded), LIMA-LAD patent, VG-RPDA patent, VG-OM3 mid-graft 60% and 80%   Depression    GERD (gastroesophageal reflux disease)    Hernia of abdominal cavity    History of 2019 novel coronavirus disease (COVID-19) 09/2019   Hyperlipidemia    Hypertension    Hypothyroidism    Iliac artery aneurysm, right ([place])    a.) at the distal landing zone of the right iliac limb measuring 3 cm   Right middle lobe pulmonary nodule 06/30/2021   a.) measured 8 mm by CT on 06/30/2021.   S/P CABG x 4 06/25/2007   a.) LIMA-LAD, SVG-RPDA, SVG-OM3, SVG-D2   Sigmoid diverticulosis    Ventricular tachycardia ([place]) 05/2018   a.) found unresponsive by EMS; VT noted --> defibrillation achieved ROSC. Briefly intubated. Episode felt to be secondary to heat stoke.    Past Surgical History:  Procedure Laterality Date   ABDOMINAL AORTIC ANEURYSM REPAIR N/A 08/29/2007   Procedure: EVAR; Location: [place]; Surgeon: Collene Schlichter,  MD   ABDOMINAL AORTIC ANEURYSM REPAIR N/A 12/05/2012   Procedure: Abdominal aortic aneurysm of approximately 6 cm in maximal diameter status post previous endovascular repair with type I and III endoleaks; Location: [place]; Surgeon: Leotis Pain, MD   CARDIAC CATHETERIZATION Left 06/28/2010   3v CAD with patent CABG grafts; LVEF 40%; stable aortic stent graft; Location: [place]; Surgeon: Serafina Royals, MD   CARDIAC CATHETERIZATION Left 06/18/2007   3v CAD; LVEF 50%; refer to CVTS for CABG; Location: [place]; Surgeon: [name], MD   COLONOSCOPY WITH PROPOFOL N/A 11/17/2021   Procedure: COLONOSCOPY WITH PROPOFOL;  Surgeon: Lin Landsman, MD;  Location: [facility] ENDOSCOPY;  Service: Gastroenterology;  Laterality: N/A;   CORONARY ARTERY BYPASS GRAFT N/A 06/25/2007   Procedure: 4v CABG (LIMA-LAD, SVG-RPDA, SVG-OM3, SVG-D2); Location: [facility]; Surgeon: Marnee Guarneri, MD   CORONARY/GRAFT ANGIOGRAPHY N/A 06/05/2018   Procedure: Remus Blake ANGIOGRAPHY;  Surgeon: Nelva Bush, MD;  Location: [place] CV LAB;  Service: Cardiovascular;  Laterality: N/A;   ENDOVASCULAR REPAIR/STENT GRAFT N/A 08/04/2021   Procedure: ENDOVASCULAR REPAIR/STENT GRAFT;  Surgeon: Leotis Pain  S, MD;  Location: [place] CV LAB;  Service: Cardiovascular;  Laterality: N/A;   EXPLORATORY LAPAROTOMY  1997   HERNIA REPAIR     KNEE ARTHROPLASTY Left 09/19/2016   Procedure: COMPUTER ASSISTED TOTAL KNEE ARTHROPLASTY;  Surgeon: Dereck Leep, MD;  Location: [facility] ORS;  Service: Orthopedics;  Laterality: Left;   RIGHT/LEFT HEART CATH AND CORONARY ANGIOGRAPHY N/A 08/05/2022   Procedure: RIGHT/LEFT HEART CATH AND CORONARY ANGIOGRAPHY;  Surgeon: Wellington Hampshire, MD;  Location: [place] CV LAB;  Service: Cardiovascular;  Laterality: N/A;   TOTAL KNEE ARTHROPLASTY Right 09/2016    Current Medications: Current Meds  Medication Sig   buPROPion (WELLBUTRIN SR) 200 MG 12 hr tablet Take 1 tablet (200 mg total) by mouth 2 (two) times  daily.   Cholecalciferol 50 MCG (2000 UT) CAPS Take 1 capsule by mouth daily.    citalopram (CELEXA) 40 MG tablet Take 1 tablet (40 mg total) by mouth daily.   ezetimibe (ZETIA) 10 MG tablet Take 1 tablet (10 mg total) by mouth daily.   levothyroxine (SYNTHROID) 75 MCG tablet Take 1 tablet (75 mcg total) by mouth daily.   metoprolol succinate (TOPROL XL) 25 MG 24 hr tablet Take 0.5 tablets (12.5 mg total) by mouth daily.   omeprazole (PRILOSEC) 20 MG capsule Take 1 capsule (20 mg total) by mouth daily.   rosuvastatin (CRESTOR) 40 MG tablet Take 1 tablet (40 mg total) by mouth daily.   sacubitril-valsartan (ENTRESTO) 24-26 MG Take 1 tablet by mouth 2 (two) times daily.   [DISCONTINUED] apixaban (ELIQUIS) 5 MG TABS tablet Take 1 tablet (5 mg total) by mouth 2 (two) times daily.    Allergies:   Benzodiazepines, Codeine, and Tetracycline   Social History   Socioeconomic History   Marital status: Married    Spouse name: Not on file   Number of children: Not on file   Years of education: 14   Highest education level: Some college, no degree  Occupational History   Occupation: retired  Tobacco Use   Smoking status: Former    Packs/day: 1.00    Years: 35.00    Total pack years: 35.00    Types: Cigarettes    Quit date: 12/16/2005    Years since quitting: 16.9    Passive exposure: Past   Smokeless tobacco: Current    Types: Chew   Tobacco comments:    pt states he very rarely uses smokeless tobacco   Vaping Use   Vaping Use: Never used  Substance and Sexual Activity   Alcohol use: No   Drug use: No   Sexual activity: Yes    Partners: Female  Other Topics Concern   Not on file  Social History Narrative   Not on file   Social Determinants of Health   Financial Resource Strain: Low Risk  (08/09/2022)   Overall Financial Resource Strain (CARDIA)    Difficulty of Paying Living Expenses: Not hard at all  Food Insecurity: No Food Insecurity (08/09/2022)   Hunger Vital Sign     Worried About Running Out of Food in the Last Year: Never true    Ran Out of Food in the Last Year: Never true  Transportation Needs: No Transportation Needs (08/09/2022)   PRAPARE - Hydrologist (Medical): No    Lack of Transportation (Non-Medical): No  Physical Activity: Insufficiently Active (08/09/2022)   Exercise Vital Sign    Days of Exercise per Week: 3 days    Minutes  of Exercise per Session: 30 min  Stress: No Stress Concern Present (08/09/2022)   [place]    Feeling of Stress : Not at all  Social Connections: [place] (08/09/2022)   Social Connection and Isolation Panel [NHANES]    Frequency of Communication with Friends and Family: More than three times a week    Frequency of Social Gatherings with Friends and Family: Once a week    Attends Religious Services: More than 4 times per year    Active Member of [employer] or Organizations: Yes    Attends Archivist Meetings: Never    Marital Status: Married     Family History:  The patient's family history includes Depression in his maternal grandfather and maternal grandmother; Heart attack in his father, mother, and another family member; Heart disease in his mother and another family member; Hypertension in his father and mother; Kidney disease in his brother. There is no history of Prostate cancer, Kidney cancer, Stomach cancer, or Colon cancer.  ROS:   12-point review of systems is negative unless otherwise noted in the HPI.   EKGs/Labs/Other Studies Reviewed:    Studies reviewed were summarized above. The additional studies were reviewed today:  [facility] 08/05/2022:   Prox LAD lesion is 50% stenosed.   Prox LAD to Mid LAD lesion is 90% stenosed.   Ost Cx to Prox Cx lesion is 40% stenosed.   Prox Cx lesion is 100% stenosed.   Prox RCA to Dist RCA lesion is 100% stenosed.   Ost Ramus lesion is 30% stenosed.   Ost  1st Diag lesion is 70% stenosed.   3rd Mrg lesion is 100% stenosed.   Origin lesion is 100% stenosed.   LIMA and is normal in caliber.   SVG and is normal in caliber.   SVG.   The graft exhibits no disease.   The graft exhibits minimal luminal irregularities.   The graft exhibits mild .   1.  Significant three-vessel coronary artery disease with patent grafts including LIMA to LAD, SVG to diagonal and SVG to right PDA.  Occluded SVG to OM which is new compared to most recent cardiac catheterization.  However, this is not unexpected given that the graft was diffusely diseased. 2.  Left ventricular angiography was not performed.  EF was moderately reduced by echo. 3.  Right heart catheterization showed normal right and left-sided filling pressures, normal pulmonary pressures and normal cardiac output.   Recommendations: Continue medical therapy for coronary artery disease. Optimize heart failure treatment.  The patient is euvolemic. __________   2D echo 07/20/2022: 1. Left ventricular ejection fraction, by estimation, is 35 to 40%. The  left ventricle has moderately decreased function. The left ventricle  demonstrates global hypokinesis. There is mild left ventricular  hypertrophy. Left ventricular diastolic  parameters are consistent with Grade I diastolic dysfunction (impaired  relaxation).   2. Right ventricular systolic function is low normal. The right  ventricular size is not well visualized.   3. The mitral valve is normal in structure. Trivial mitral valve  regurgitation.   4. The aortic valve is tricuspid. Aortic valve regurgitation is trivial.  Aortic valve sclerosis is present, with no evidence of aortic valve  stenosis.   5. Aortic dilatation noted. There is mild dilatation of the aortic root,  measuring 43 mm. There is mild dilatation of the ascending aorta,  measuring 42 mm.   6. The inferior vena cava is normal in size  with greater than 50%  respiratory variability,  suggesting right atrial pressure of 3 mmHg. __________   [facility] MPI 06/23/2022:   Findings are consistent with prior myocardial infarction. The study is intermediate risk.   No ST deviation was noted.   LV perfusion is abnormal. There is no evidence of ischemia. There is evidence of infarction. Defect 1: There is a medium defect with moderate reduction in uptake present in the apical to mid inferolateral location(s) that is fixed. There is abnormal wall motion in the defect area. Consistent with infarction. Defect 2: There is a medium defect with moderate reduction in uptake present in the basal inferior location(s) that is fixed. There is abnormal wall motion in the defect area. Consistent with infarction.   Left ventricular function is abnormal. Global function is moderately reduced.  Calculated ejection fraction is 38%. __________   LHC 05/2018: Conclusions: Severe native coronary artery disease with subtotal/total occlusions of the proximal/mid LAD, proximal LCx, and proximal RCA. Widely patent LIMA to LAD, SVG to diagonal, and SVG to RPDA. Patent but diffusely diseased SVG to OM3.  There are sequential focal 60% and 80% stenoses in the middle of the graft.  Intervention was not attempted due to the patient's inability to remain still on the table and consistently follow commands, as well as recent acute kidney injury in the setting of rhabdomyolysis.  Additionally, this graft supplies a relatively small territory.   Recommendations: Continue medical therapy and secondary prevention.  If platelets stabilize, recommend adding clopidogrel 75 mg daily to be continued at least 12 months. If patient has signs/symptoms of ischemia or recurrent ventricular tachycardia, consider PCI to SVG to OM3 once mental status improves.   Recommend uninterrupted dual antiplatelet therapy with Aspirin '81mg'$  daily and Clopidogrel '75mg'$  daily for a minimum of 12 months (ACS - Class I recommendation).  Clopidogrel  will be added once platelet count has stabilized. __________   2D echo 05/2018: - Left ventricle: The cavity size was mildly dilated. Systolic   function was normal. The estimated ejection fraction was in the   range of 50% to 55%. Challenging images though grossly normal   Wall motion; there were grossly no significant regional wall   motion abnormalities. Left ventricular diastolic function   parameters were normal. - Ascending aorta: The ascending aorta was moderately dilated. 4.4   cm - Left atrium: The appendage was mildly to moderately dilated. - Right ventricle: Systolic function was normal. - Pulmonary arteries: PA peak pressure: 44 mm Hg (S).   EKG:  EKG is ordered today.  The EKG ordered today demonstrates NSR with PACs, 80 bpm, rare PVC, left anterior fascicular block, poor R wave progression along the precordial leads, nonspecific ST-T changes  Recent Labs: 03/22/2022: TSH 3.960 10/11/2022: ALT 17; Hemoglobin 14.8; Platelets 156 10/25/2022: BUN 29; Creatinine, Ser 1.37; Potassium 5.0; Sodium 138  Recent Lipid Panel    Component Value Date/Time   CHOL 146 10/11/2022 1130   CHOL 159 12/18/2015 0827   TRIG 112 10/11/2022 1130   TRIG 166 (H) 12/18/2015 0827   HDL 46 10/11/2022 1130   CHOLHDL 2.9 03/29/2022 0913   CHOLHDL 3.4 09/25/2018 1208   VLDL 31 09/25/2018 1208   VLDL 33 (H) 12/18/2015 0827   LDLCALC 80 10/11/2022 1130    PHYSICAL EXAM:    VS:  BP 110/60 (BP Location: Left Arm, Patient Position: Sitting, Cuff Size: Normal)   Pulse 80   Ht '5\' 11"'$  (1.803 m)   Wt 216 lb 6  oz (98.1 kg)   SpO2 98%   BMI 30.18 kg/m   BMI: Body mass index is 30.18 kg/m.  Physical Exam Vitals reviewed.  Constitutional:      Appearance: He is well-developed.  HENT:     Head: Normocephalic and atraumatic.  Eyes:     General:        Right eye: No discharge.        Left eye: No discharge.  Neck:     Vascular: No JVD.  Cardiovascular:     Rate and Rhythm: Normal rate and  regular rhythm.     Heart sounds: Normal heart sounds, S1 normal and S2 normal. Heart sounds not distant. No midsystolic click and no opening snap. No murmur heard.    No friction rub.  Pulmonary:     Effort: Pulmonary effort is normal. No respiratory distress.     Breath sounds: Normal breath sounds. No decreased breath sounds, wheezing or rales.  Chest:     Chest wall: No tenderness.  Abdominal:     General: There is no distension.  Musculoskeletal:     Cervical back: Normal range of motion.  Skin:    General: Skin is warm and dry.     Nails: There is no clubbing.  Neurological:     Mental Status: He is alert and oriented to person, place, and time.  Psychiatric:        Speech: Speech normal.        Behavior: Behavior normal.        Thought Content: Thought content normal.        Judgment: Judgment normal.     Wt Readings from Last 3 Encounters:  11/16/22 216 lb 6 oz (98.1 kg)  10/14/22 217 lb 4 oz (98.5 kg)  10/11/22 216 lb 4.8 oz (98.1 kg)     ASSESSMENT & PLAN:   CAD status post CABG with stable exertional dyspnea: No symptoms suggestive of angina or decompensation.  Recent LHC with medical therapy recommended as outlined above.  Continue aggressive risk factor modification and secondary prevention including apixaban in place of aspirin, to minimize bleeding risk in the context of underlying A-fib, as well as rosuvastatin, ezetimibe, and metoprolol.  No indication for further ischemic testing at this time.  HFrEF secondary to ICM: He appears euvolemic and well compensated with NYHA class II-III symptoms.  Hypotension and underlying renal dysfunction have limited escalation of GDMT and have led to the decreasing of Toprol-XL to 12.5 mg daily.  Continue Entresto 24/26 mg twice daily given noted improvement in dizziness and no further hypotension on lower dose metoprolol.  Defer addition of SGLT2 to better given prior AKI noted with this.  Not currently on MRA secondary to  underlying renal dysfunction.  Pursue limited echo in 2 months to reevaluate LV systolic function on maximally tolerated GDMT.  Not currently requiring a standing loop diuretic.  PAF: Maintaining sinus rhythm on Toprol-XL.  CHA2DS2-VASc at least 4.  He remains on apixaban 5 mg twice daily and does not meet reduced dosing criteria.  No symptoms concerning for bleeding or falls.  Renal function, electrolytes, and Hgb stable on labs last month.  HTN: Blood pressure is well-controlled in the office today at 110/60.  Continue medical therapy as outlined above.  HLD: LDL 74 in 03/2022.  He remains on rosuvastatin and ezetimibe.  Abdominal aortic aneurysm/iliac artery aneurysm: Status postrepair.  Followed by vascular surgery.  Dilated aortic root/ascending aorta: Stable on echo in 07/2022 when compared  to echo in 2019.  Aortic valve previously documented to be tricuspid.  Monitor with periodic echo.  Given stability noted over multiple years, defer cross-sectional imaging in an effort to minimize nephrotoxic agents.  Optimal blood pressure control recommended.  CKD stage IIIa: Stable on recent check.  Followed by nephrology.  History of dysarthria: Unable to exclude CVA versus transient episode of hypotension as this occurred prior to de-escalation of Toprol-XL.  He reports adherence to apixaban.  Schedule MRI of the brain to evaluate for history of CVA.    Disposition: F/u with Dr. Fletcher Anon or an APP in 3 months.   Medication Adjustments/Labs and Tests Ordered: Current medicines are reviewed at length with the patient today.  Concerns regarding medicines are outlined above. Medication changes, Labs and Tests ordered today are summarized above and listed in the Patient Instructions accessible in Encounters.   SignedChristell Faith, PA-C 11/16/2022 12:07 PM     [address] [place], [place] [address] [phone]

## 2022-11-16 ENCOUNTER — Encounter: Payer: Self-pay | Admitting: Nurse Practitioner

## 2022-11-16 ENCOUNTER — Ambulatory Visit: Payer: Medicare HMO | Attending: Physician Assistant | Admitting: Physician Assistant

## 2022-11-16 ENCOUNTER — Encounter: Payer: Self-pay | Admitting: Physician Assistant

## 2022-11-16 ENCOUNTER — Other Ambulatory Visit: Payer: Self-pay | Admitting: Nurse Practitioner

## 2022-11-16 VITALS — BP 110/60 | HR 80 | Ht 71.0 in | Wt 216.4 lb

## 2022-11-16 DIAGNOSIS — R471 Dysarthria and anarthria: Secondary | ICD-10-CM

## 2022-11-16 DIAGNOSIS — I480 Paroxysmal atrial fibrillation: Secondary | ICD-10-CM | POA: Diagnosis not present

## 2022-11-16 DIAGNOSIS — I5020 Unspecified systolic (congestive) heart failure: Secondary | ICD-10-CM

## 2022-11-16 DIAGNOSIS — I714 Abdominal aortic aneurysm, without rupture, unspecified: Secondary | ICD-10-CM

## 2022-11-16 DIAGNOSIS — I723 Aneurysm of iliac artery: Secondary | ICD-10-CM

## 2022-11-16 DIAGNOSIS — E785 Hyperlipidemia, unspecified: Secondary | ICD-10-CM

## 2022-11-16 DIAGNOSIS — I2510 Atherosclerotic heart disease of native coronary artery without angina pectoris: Secondary | ICD-10-CM | POA: Diagnosis not present

## 2022-11-16 DIAGNOSIS — Z951 Presence of aortocoronary bypass graft: Secondary | ICD-10-CM | POA: Diagnosis not present

## 2022-11-16 DIAGNOSIS — N1831 Chronic kidney disease, stage 3a: Secondary | ICD-10-CM

## 2022-11-16 DIAGNOSIS — I77810 Thoracic aortic ectasia: Secondary | ICD-10-CM

## 2022-11-16 DIAGNOSIS — I10 Essential (primary) hypertension: Secondary | ICD-10-CM

## 2022-11-16 DIAGNOSIS — Z9889 Other specified postprocedural states: Secondary | ICD-10-CM

## 2022-11-16 DIAGNOSIS — Z8679 Personal history of other diseases of the circulatory system: Secondary | ICD-10-CM

## 2022-11-16 MED ORDER — BUPROPION HCL ER (SR) 200 MG PO TB12: 200.0000 mg | ORAL_TABLET | Freq: Two times a day (BID) | ORAL | 4 refills | Status: DC

## 2022-11-16 MED ORDER — APIXABAN 5 MG PO TABS: 5.0000 mg | ORAL_TABLET | Freq: Two times a day (BID) | ORAL | 3 refills | Status: DC

## 2022-11-16 NOTE — Telephone Encounter (Signed)
Requested Prescriptions  Pending Prescriptions Disp Refills   buPROPion (WELLBUTRIN SR) 200 MG 12 hr tablet [Pharmacy Med Name: BUPROPION HCL SR 200 MG TABLET] 180 tablet 0    Sig: Take 1 tablet (200 mg total) by mouth 2 (two) times daily.     Psychiatry: Antidepressants - bupropion Failed - 11/16/2022  1:36 PM      Failed - Cr in normal range and within 360 days    Creatinine  Date Value Ref Range Status  02/20/2015 1.37 (H) mg/dL Final    Comment:    0.61-1.24 NOTE: New Reference Range  01/20/15    Creatinine, Ser  Date Value Ref Range Status  10/25/2022 1.37 (H) 0.61 - 1.24 mg/dL Final         Passed - AST in normal range and within 360 days    AST  Date Value Ref Range Status  10/11/2022 31 0 - 40 IU/L Final   SGOT(AST)  Date Value Ref Range Status  12/13/2014 30 15 - 37 Unit/L Final         Passed - ALT in normal range and within 360 days    ALT  Date Value Ref Range Status  10/11/2022 17 0 - 44 IU/L Final   SGPT (ALT)  Date Value Ref Range Status  12/13/2014 20 14 - 63 U/L Final         Passed - Completed PHQ-2 or PHQ-9 in the last 360 days      Passed - Last BP in normal range    BP Readings from Last 1 Encounters:  11/16/22 110/60         Passed - Valid encounter within last 6 months    Recent Outpatient Visits           1 month ago Breast pain, left   [facility] Kathrine Haddock, NP   7 months ago Hyperlipidemia, unspecified hyperlipidemia type   [facility] Vigg, Avanti, MD   1 year ago Screen for colon cancer   [facility] Vigg, Avanti, MD   1 year ago Screening for colon cancer   [facility] Vigg, Avanti, MD   1 year ago Hypertensive heart and kidney disease without heart failure and with stage 3a chronic kidney disease ([place])   [place], Barbaraann Faster, NP       Future Appointments             In 1 month Cannady, Barbaraann Faster, NP [employer],  [place]   In 3 months  Dunn, Areta Haber, PA-C [place]. [facility]             citalopram (CELEXA) 40 MG tablet [Pharmacy Med Name: CITALOPRAM HBR 40 MG TABLET] 90 tablet 0    Sig: Take 1 tablet (40 mg total) by mouth daily.     Psychiatry:  Antidepressants - SSRI Passed - 11/16/2022  1:36 PM      Passed - Completed PHQ-2 or PHQ-9 in the last 360 days      Passed - Valid encounter within last 6 months    Recent Outpatient Visits           1 month ago Breast pain, left   [facility] Kathrine Haddock, NP   7 months ago Hyperlipidemia, unspecified hyperlipidemia type   [facility] Vigg, Avanti, MD   1 year ago Screen for colon cancer   [facility]  Charlynne Cousins, MD   1 year ago Screening for colon cancer   [facility] Vigg, Avanti, MD   1 year ago Hypertensive heart and kidney disease without heart failure and with stage 3a chronic kidney disease ([place])   [place], Barbaraann Faster, NP       Future Appointments             In 1 month Cannady, Barbaraann Faster, NP [employer], [place]   In 3 months Dunn, Areta Haber, PA-C [place]. [place]

## 2022-11-16 NOTE — Addendum Note (Signed)
Addended by: Valora Corporal on: 11/16/2022 12:13 PM   Modules accepted: Orders

## 2022-11-16 NOTE — Patient Instructions (Signed)
Medication Instructions:  No changes at this time.   *If you need a refill on your cardiac medications before your next appointment, please call your pharmacy*   Lab Work: None  If you have labs (blood work) drawn today and your tests are completely normal, you will receive your results only by: [place] (if you have MyChart) OR A paper copy in the mail If you have any lab test that is abnormal or we need to change your treatment, we will call you to review the results.   Testing/Procedures: Your physician has requested that you have an echocardiogram 2 months. Echocardiography is a painless test that uses sound waves to create images of your heart. It provides your doctor with information about the size and shape of your heart and how well your heart's chambers and valves are working. This procedure takes approximately one hour. There are no restrictions for this procedure. Please do NOT wear cologne, perfume, aftershave, or lotions (deodorant is allowed). Please arrive 15 minutes prior to your appointment time.  MRI of the brain has been ordered. Please call [phone] to schedule this procedure.    Follow-Up: At [facility], you and your health needs are our priority.  As part of our continuing mission to provide you with exceptional heart care, we have created designated Provider Care Teams.  These Care Teams include your primary Cardiologist (physician) and Advanced Practice Providers (APPs -  Physician Assistants and Nurse Practitioners) who all work together to provide you with the care you need, when you need it.  Your next appointment:   3 month(s)  The format for your next appointment:   In Person  Provider:   [name], MD or Christell Faith, PA-C         Important Information About Sugar

## 2022-11-17 ENCOUNTER — Encounter: Payer: Medicare HMO | Attending: Cardiovascular Disease | Admitting: *Deleted

## 2022-11-17 DIAGNOSIS — I5022 Chronic systolic (congestive) heart failure: Secondary | ICD-10-CM | POA: Insufficient documentation

## 2022-11-17 NOTE — Progress Notes (Signed)
Daily Session Note  Patient Details  Name: MAVEN VARELAS MRN: [id number] Date of Birth: 04-23-1951 Referring Provider:   [name] Pulmonary Rehab from 09/22/2022 in [facility] Cardiac and Pulmonary Rehab  Referring Provider [name], MD       Encounter Date: 11/17/2022  Check In:  Session Check In - 11/17/22 1017       Check-In   Supervising physician immediately available to respond to emergencies See telemetry face sheet for immediately available ER MD    Location [facility]-Cardiac & Pulmonary Rehab    Staff Present Darlyne Russian, RN, Lorin Mercy, MS, ACSM CEP, Exercise Physiologist;Joseph Tessie Fass, [place]    Virtual Visit No    Medication changes reported     Yes    Comments Toprol XL cut in half 12.5 mg daily    Fall or balance concerns reported    No    Warm-up and Cool-down Performed on first and last piece of equipment    Resistance Training Performed Yes    VAD Patient? No    PAD/SET Patient? No      Pain Assessment   Currently in Pain? No/denies                Social History   Tobacco Use  Smoking Status Former   Packs/day: 1.00   Years: 35.00   Total pack years: 35.00   Types: Cigarettes   Quit date: 12/16/2005   Years since quitting: 16.9   Passive exposure: Past  Smokeless Tobacco Current   Types: Chew  Tobacco Comments   pt states he very rarely uses smokeless tobacco     Goals Met:  Independence with exercise equipment Exercise tolerated well No report of concerns or symptoms today Strength training completed today  Goals Unmet:  Not Applicable  Comments: Pt able to follow exercise prescription today without complaint.  Will continue to monitor for progression.    Dr. Emily Filbert is Medical Director for [place].  Dr. Ottie Glazier is Medical Director for [facility] Pulmonary Rehabilitation.

## 2022-11-18 ENCOUNTER — Ambulatory Visit
Admission: RE | Admit: 2022-11-18 | Discharge: 2022-11-18 | Disposition: A | Discharge status: Home / Self Care | Payer: Medicare HMO | Source: Ambulatory Visit | Attending: Nephrology | Admitting: Nephrology

## 2022-11-18 DIAGNOSIS — N1831 Chronic kidney disease, stage 3a: Secondary | ICD-10-CM | POA: Diagnosis not present

## 2022-11-23 ENCOUNTER — Ambulatory Visit
Admission: RE | Admit: 2022-11-23 | Discharge: 2022-11-23 | Disposition: A | Discharge status: Home / Self Care | Payer: Medicare HMO | Source: Ambulatory Visit | Attending: Physician Assistant | Admitting: Physician Assistant

## 2022-11-23 DIAGNOSIS — R471 Dysarthria and anarthria: Secondary | ICD-10-CM | POA: Diagnosis present

## 2022-11-24 ENCOUNTER — Encounter: Payer: Medicare HMO | Admitting: *Deleted

## 2022-11-24 DIAGNOSIS — I5022 Chronic systolic (congestive) heart failure: Secondary | ICD-10-CM | POA: Diagnosis not present

## 2022-11-24 NOTE — Progress Notes (Signed)
Daily Session Note  Patient Details  Name: Stuart Harvey MRN: [id number] Date of Birth: October 02, 1951 Referring Provider:   Flowsheet Row Pulmonary Rehab from 09/22/2022 in [facility] Cardiac and Pulmonary Rehab  Referring Provider [name], MD       Encounter Date: 11/24/2022  Check In:  Session Check In - 11/24/22 1006       Check-In   Supervising physician immediately available to respond to emergencies See telemetry face sheet for immediately available ER MD    Location [facility]-Cardiac & Pulmonary Rehab    Staff Present Darlyne Russian, RN, Lorin Mercy, MS, ACSM CEP, Exercise Physiologist;Joseph Tessie Fass, [place]    Virtual Visit No    Medication changes reported     No    Fall or balance concerns reported    No    Warm-up and Cool-down Performed on first and last piece of equipment    Resistance Training Performed Yes    VAD Patient? No    PAD/SET Patient? No      Pain Assessment   Currently in Pain? No/denies                Social History   Tobacco Use  Smoking Status Former   Packs/day: 1.00   Years: 35.00   Total pack years: 35.00   Types: Cigarettes   Quit date: 12/16/2005   Years since quitting: 16.9   Passive exposure: Past  Smokeless Tobacco Current   Types: Chew  Tobacco Comments   pt states he very rarely uses smokeless tobacco     Goals Met:  Independence with exercise equipment Exercise tolerated well No report of concerns or symptoms today Strength training completed today  Goals Unmet:  Not Applicable  Comments: Pt able to follow exercise prescription today without complaint.  Will continue to monitor for progression.    Dr. Emily Filbert is Medical Director for [place].  Dr. Ottie Glazier is Medical Director for [facility] Pulmonary Rehabilitation.

## 2022-11-25 ENCOUNTER — Other Ambulatory Visit: Payer: Self-pay | Admitting: Nurse Practitioner

## 2022-11-25 NOTE — Telephone Encounter (Signed)
Requested Prescriptions  Pending Prescriptions Disp Refills   omeprazole (PRILOSEC) 20 MG capsule [Pharmacy Med Name: OMEPRAZOLE DR 20 MG CAPSULE] 90 capsule 0    Sig: Take 1 capsule (20 mg total) by mouth daily.     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 [place]

## 2022-11-29 ENCOUNTER — Encounter: Payer: Medicare HMO | Admitting: *Deleted

## 2022-11-29 DIAGNOSIS — I5022 Chronic systolic (congestive) heart failure: Secondary | ICD-10-CM

## 2022-11-29 NOTE — Progress Notes (Signed)
Daily Session Note  Patient Details  Name: Stuart Harvey MRN: [id number] Date of Birth: Oct 02, 1951 Referring Provider:   [name] Pulmonary Rehab from 09/22/2022 in [facility] Cardiac and Pulmonary Rehab  Referring Provider [name], MD       Encounter Date: 11/29/2022  Check In:  Session Check In - 11/29/22 1035       Check-In   Supervising physician immediately available to respond to emergencies See telemetry face sheet for immediately available ER MD    Location [facility]-Cardiac & Pulmonary Rehab    Staff Present Renita Papa, RN Abel Presto, MS, ACSM CEP, Exercise Physiologist;Jessica Luan Pulling, MA, RCEP, CCRP, CCET    Virtual Visit No    Medication changes reported     No    Fall or balance concerns reported    No    Warm-up and Cool-down Performed on first and last piece of equipment    Resistance Training Performed Yes    VAD Patient? No    PAD/SET Patient? No      Pain Assessment   Currently in Pain? No/denies                Social History   Tobacco Use  Smoking Status Former   Packs/day: 1.00   Years: 35.00   Total pack years: 35.00   Types: Cigarettes   Quit date: 12/16/2005   Years since quitting: 16.9   Passive exposure: Past  Smokeless Tobacco Current   Types: Chew  Tobacco Comments   pt states he very rarely uses smokeless tobacco     Goals Met:  Independence with exercise equipment Exercise tolerated well No report of concerns or symptoms today Strength training completed today  Goals Unmet:  Not Applicable  Comments: Pt able to follow exercise prescription today without complaint.  Will continue to monitor for progression.    Dr. Emily Filbert is Medical Director for [place].  Dr. Ottie Glazier is Medical Director for [facility] Pulmonary Rehabilitation.

## 2022-12-01 ENCOUNTER — Encounter: Payer: Medicare HMO | Admitting: *Deleted

## 2022-12-06 ENCOUNTER — Encounter: Payer: Medicare HMO | Admitting: *Deleted

## 2022-12-06 DIAGNOSIS — I5022 Chronic systolic (congestive) heart failure: Secondary | ICD-10-CM | POA: Diagnosis not present

## 2022-12-06 NOTE — Progress Notes (Signed)
Daily Session Note  Patient Details  Name: GADGE HERMIZ MRN: [id number] Date of Birth: [birth year] Referring Provider:   [name] Pulmonary Rehab from 09/22/2022 in [facility] Cardiac and Pulmonary Rehab  Referring Provider [name], MD       Encounter Date: 12/06/2022  Check In:  Session Check In - 12/06/22 1035       Check-In   Supervising physician immediately available to respond to emergencies See telemetry face sheet for immediately available ER MD    Location [facility]-Cardiac & Pulmonary Rehab    Staff Present Heath Lark, RN, BSN, CCRP;Laureen Owens Shark, BS, RRT, CPFT;Kara Maricela Bo, MS, ACSM CEP, Exercise Physiologist    Virtual Visit No    Medication changes reported     No    Fall or balance concerns reported    No    Warm-up and Cool-down Performed on first and last piece of equipment    Resistance Training Performed Yes    VAD Patient? No    PAD/SET Patient? No      Pain Assessment   Currently in Pain? No/denies                Social History   Tobacco Use  Smoking Status Former   Packs/day: 1.00   Years: 35.00   Total pack years: 35.00   Types: Cigarettes   Quit date: 12/16/2005   Years since quitting: 16.9   Passive exposure: Past  Smokeless Tobacco Current   Types: Chew  Tobacco Comments   pt states he very rarely uses smokeless tobacco     Goals Met:  Proper associated with RPD/PD & O2 Sat Independence with exercise equipment Exercise tolerated well No report of concerns or symptoms today  Goals Unmet:  Not Applicable  Comments: Pt able to follow exercise prescription today without complaint.  Will continue to monitor for progression.    Dr. Emily Filbert is Medical Director for [place].  Dr. Ottie Glazier is Medical Director for [facility] Pulmonary Rehabilitation.

## 2022-12-07 ENCOUNTER — Encounter: Payer: Self-pay | Admitting: *Deleted

## 2022-12-07 DIAGNOSIS — I5022 Chronic systolic (congestive) heart failure: Secondary | ICD-10-CM

## 2022-12-07 NOTE — Progress Notes (Signed)
Pulmonary Individual Treatment Plan  Patient Details  Name: Stuart Harvey MRN: [id number] Date of Birth: 01/13/1951 Referring Provider:   Flowsheet Row Pulmonary Rehab from 09/22/2022 in [facility] Cardiac and Pulmonary Rehab  Referring Provider [name], MD       Initial Encounter Date:  Flowsheet Row Pulmonary Rehab from 09/22/2022 in [facility] Cardiac and Pulmonary Rehab  Date 09/22/22       Visit Diagnosis: Heart failure, chronic systolic ([place])  Patient's Home Medications on Admission:  Current Outpatient Medications:    apixaban (ELIQUIS) 5 MG TABS tablet, Take 1 tablet (5 mg total) by mouth 2 (two) times daily., Disp: 180 tablet, Rfl: 3   buPROPion (WELLBUTRIN SR) 200 MG 12 hr tablet, Take 1 tablet (200 mg total) by mouth 2 (two) times daily., Disp: 180 tablet, Rfl: 4   Cholecalciferol 50 MCG (2000 UT) CAPS, Take 1 capsule by mouth daily. , Disp: , Rfl:    citalopram (CELEXA) 40 MG tablet, Take 1 tablet (40 mg total) by mouth daily., Disp: 90 tablet, Rfl: 0   ezetimibe (ZETIA) 10 MG tablet, Take 1 tablet (10 mg total) by mouth daily., Disp: 90 tablet, Rfl: 1   levothyroxine (SYNTHROID) 75 MCG tablet, Take 1 tablet (75 mcg total) by mouth daily., Disp: 90 tablet, Rfl: 0   metoprolol succinate (TOPROL XL) 25 MG 24 hr tablet, Take 0.5 tablets (12.5 mg total) by mouth daily., Disp: 90 tablet, Rfl: 3   omeprazole (PRILOSEC) 20 MG capsule, Take 1 capsule (20 mg total) by mouth daily., Disp: 90 capsule, Rfl: 0   rosuvastatin (CRESTOR) 40 MG tablet, Take 1 tablet (40 mg total) by mouth daily., Disp: 90 tablet, Rfl: 2   sacubitril-valsartan (ENTRESTO) 24-26 MG, Take 1 tablet by mouth 2 (two) times daily., Disp: 60 tablet, Rfl: 11  Past Medical History: Past Medical History:  Diagnosis Date   AAA (abdominal aortic aneurysm) ([place])    a.) s/p EVAR in 08/2007 by Dr. Hulda Humphrey. b.) CTA on 06/30/2021 --> interval aneurysmal dilitation superior to stent graft with the juxtarenal aorta measuring  3.8 cm.   Anemia    Arthritis    both knees   Chronic anticoagulation    Apixaban   CKD (chronic kidney disease), stage II    Coronary artery disease    a.) 4v CABG @ [facility] 06/2007 (LIMA-LAD, VG-RPDA, VG-OM3,VG-D2); b.) LHC 2011: patent grafts EF 40%, c.) LHC 06/05/18: LM nl, pLAD 50%, p-mLAD 50%, ostD1 70%, ost ramus 30%, ost-pLCx 40%, pLCx 100% (chronically occluded), OM3 100%, p-dRCA 100% (chronically occluded), LIMA-LAD patent, VG-RPDA patent, VG-OM3 mid-graft 60% and 80%   Depression    GERD (gastroesophageal reflux disease)    Hernia of abdominal cavity    History of 2019 novel coronavirus disease (COVID-19) 09/2019   Hyperlipidemia    Hypertension    Hypothyroidism    Iliac artery aneurysm, right ([place])    a.) at the distal landing zone of the right iliac limb measuring 3 cm   Right middle lobe pulmonary nodule 06/30/2021   a.) measured 8 mm by CT on 06/30/2021.   S/P CABG x 4 06/25/2007   a.) LIMA-LAD, SVG-RPDA, SVG-OM3, SVG-D2   Sigmoid diverticulosis    Ventricular tachycardia ([place]) 05/2018   a.) found unresponsive by EMS; VT noted --> defibrillation achieved ROSC. Briefly intubated. Episode felt to be secondary to heat stoke.    Tobacco Use: Social History   Tobacco Use  Smoking Status Former   Packs/day: 1.00   Years: 35.00  Total pack years: 35.00   Types: Cigarettes   Quit date: 12/16/2005   Years since quitting: 16.9   Passive exposure: Past  Smokeless Tobacco Current   Types: Chew  Tobacco Comments   pt states he very rarely uses smokeless tobacco     Labs: Review Flowsheet  More data exists      Latest Ref Rng & Units 01/05/2021 02/18/2021 09/22/2021 03/29/2022 10/11/2022  Labs for ITP Cardiac and Pulmonary Rehab  Cholestrol 100 - 199 mg/dL 171  127  145  142  146   LDL (calc) 0 - 99 mg/dL 104  58  77  74  80   HDL-C >39 mg/dL 52  45  47  49  46   Trlycerides 0 - 149 mg/dL 83  138  114  105  112   Hemoglobin A1c 4.8 - 5.6 % 5.7  - - - -     Pulmonary  Assessment Scores:  Pulmonary Assessment Scores     Row Name 09/22/22 1338 09/27/22 1131       ADL UCSD   ADL Phase Entry Entry    SOB Score total -- 37    Rest -- 1    Walk -- 1    Stairs -- 2    Bath -- 1    Dress -- 1    Shop -- 2      CAT Score   CAT Score -- 13      mMRC Score   mMRC Score 1 --             UCSD: Self-administered rating of dyspnea associated with activities of daily living (ADLs) 6-point scale (0 = "not at all" to 5 = "maximal or unable to do because of breathlessness")  Scoring Scores range from 0 to 120.  Minimally important difference is 5 units  CAT: CAT can identify the health impairment of COPD patients and is better correlated with disease progression.  CAT has a scoring range of zero to 40. The CAT score is classified into four groups of low (less than 10), medium (10 - 20), high (21-30) and very high (31-40) based on the impact level of disease on health status. A CAT score over 10 suggests significant symptoms.  A worsening CAT score could be explained by an exacerbation, poor medication adherence, poor inhaler technique, or progression of COPD or comorbid conditions.  CAT MCID is 2 points  mMRC: mMRC (Modified Medical Research Council) Dyspnea Scale is used to assess the degree of baseline functional disability in patients of respiratory disease due to dyspnea. No minimal important difference is established. A decrease in score of 1 point or greater is considered a positive change.   Pulmonary Function Assessment:  Pulmonary Function Assessment - 09/12/22 1113       Breath   Shortness of Breath Yes;Limiting activity             Exercise Target Goals: Exercise Program Goal: Individual exercise prescription set using results from initial 6 min walk test and THRR while considering  patient's activity barriers and safety.   Exercise Prescription Goal: Initial exercise prescription builds to 30-45 minutes a day of aerobic  activity, 2-3 days per week.  Home exercise guidelines will be given to patient during program as part of exercise prescription that the participant will acknowledge.  Education: Aerobic Exercise: - Group verbal and visual presentation on the components of exercise prescription. Introduces F.I.T.T principle from ACSM for exercise prescriptions.  Reviews F.I.T.T.  principles of aerobic exercise including progression. Written material given at graduation. Flowsheet Row Pulmonary Rehab from 11/17/2022 in [facility] Cardiac and Pulmonary Rehab  Education need identified 09/22/22       Education: Resistance Exercise: - Group verbal and visual presentation on the components of exercise prescription. Introduces F.I.T.T principle from ACSM for exercise prescriptions  Reviews F.I.T.T. principles of resistance exercise including progression. Written material given at graduation. Flowsheet Row Pulmonary Rehab from 11/17/2022 in [facility] Cardiac and Pulmonary Rehab  Date 10/04/22  Educator NT  Instruction Review Code 1- Verbalizes Understanding        Education: Exercise & Equipment Safety: - Individual verbal instruction and demonstration of equipment use and safety with use of the equipment. Flowsheet Row Pulmonary Rehab from 11/17/2022 in [facility] Cardiac and Pulmonary Rehab  Date 09/12/22  Educator [facility]  Instruction Review Code 1- Verbalizes Understanding       Education: Exercise Physiology & General Exercise Guidelines: - Group verbal and written instruction with models to review the exercise physiology of the cardiovascular system and associated critical values. Provides general exercise guidelines with specific guidelines to those with heart or lung disease.    Education: Flexibility, Balance, Mind/Body Relaxation: - Group verbal and visual presentation with interactive activity on the components of exercise prescription. Introduces F.I.T.T principle from ACSM for exercise prescriptions. Reviews F.I.T.T.  principles of flexibility and balance exercise training including progression. Also discusses the mind body connection.  Reviews various relaxation techniques to help reduce and manage stress (i.e. Deep breathing, progressive muscle relaxation, and visualization). Balance handout provided to take home. Written material given at graduation. Flowsheet Row Pulmonary Rehab from 11/17/2022 in [facility] Cardiac and Pulmonary Rehab  Date 10/04/22  Educator NT  Instruction Review Code 1- Verbalizes Understanding       Activity Barriers & Risk Stratification:  Activity Barriers & Cardiac Risk Stratification - 09/22/22 1327       Activity Barriers & Cardiac Risk Stratification   Activity Barriers Left Knee Replacement;Right Hip Replacement;Other (comment);Shortness of Breath    Comments Hip Pain from Aneurysm    Cardiac Risk Stratification High             6 Minute Walk:  6 Minute Walk     Row Name 11/21/22 1338         6 Minute Walk   Phase Initial  Completed on 09/22/2022     Distance 1215 feet     Walk Time 6 minutes     # of Rest Breaks 0     MPH 2.3     METS 2.38     RPE 11     Perceived Dyspnea  1     VO2 Peak 8.35     Symptoms Yes (comment)     Comments Hip pain 4/10     Resting HR 66 bpm     Resting BP 102/60     Resting Oxygen Saturation  96 %     Exercise Oxygen Saturation  during 6 min walk 94 %     Max Ex. HR 88 bpm     Max Ex. BP 118/76     2 Minute Post BP 108/68             Oxygen Initial Assessment:  Oxygen Initial Assessment - 09/12/22 1112       Home Oxygen   Home Oxygen Device None    Sleep Oxygen Prescription None    Home Exercise Oxygen Prescription None    Home Resting  Oxygen Prescription None      Initial 6 min Walk   Oxygen Used None      Program Oxygen Prescription   Program Oxygen Prescription None      Intervention   Short Term Goals To learn and understand importance of monitoring SPO2 with pulse oximeter and demonstrate accurate use  of the pulse oximeter.;To learn and understand importance of maintaining oxygen saturations>88%;To learn and demonstrate proper pursed lip breathing techniques or other breathing techniques. ;To learn and exhibit compliance with exercise, home and travel O2 prescription    Long  Term Goals Verbalizes importance of monitoring SPO2 with pulse oximeter and return demonstration;Maintenance of O2 saturations>88%;Exhibits proper breathing techniques, such as pursed lip breathing or other method taught during program session;Compliance with respiratory medication;Exhibits compliance with exercise, home  and travel O2 prescription             Oxygen Re-Evaluation:  Oxygen Re-Evaluation     Row Name 09/27/22 1042 10/27/22 1043 11/24/22 1011         Program Oxygen Prescription   Program Oxygen Prescription None None None       Home Oxygen   Home Oxygen Device None None None     Sleep Oxygen Prescription None None None     Home Exercise Oxygen Prescription None None None     Home Resting Oxygen Prescription None None None       Goals/Expected Outcomes   Short Term Goals To learn and demonstrate proper pursed lip breathing techniques or other breathing techniques.  To learn and demonstrate proper pursed lip breathing techniques or other breathing techniques. ;To learn and understand importance of monitoring SPO2 with pulse oximeter and demonstrate accurate use of the pulse oximeter.;To learn and understand importance of maintaining oxygen saturations>88% To learn and demonstrate proper pursed lip breathing techniques or other breathing techniques. ;To learn and understand importance of monitoring SPO2 with pulse oximeter and demonstrate accurate use of the pulse oximeter.;To learn and understand importance of maintaining oxygen saturations>88%     Long  Term Goals Exhibits proper breathing techniques, such as pursed lip breathing or other method taught during program session Exhibits proper breathing  techniques, such as pursed lip breathing or other method taught during program session;Verbalizes importance of monitoring SPO2 with pulse oximeter and return demonstration;Maintenance of O2 saturations>88%;Compliance with respiratory medication Exhibits proper breathing techniques, such as pursed lip breathing or other method taught during program session;Verbalizes importance of monitoring SPO2 with pulse oximeter and return demonstration;Maintenance of O2 saturations>88%;Compliance with respiratory medication     Comments Reviewed PLB technique with pt.  Talked about how it works and it's importance in maintaining their exercise saturations. Stuart Harvey is doing well in rehab.  He is doing well with using his PLB.  His saturations are doing well and he tries to keep any eye on them.  He feels that his breathing is starting to improve. Stuart Harvey continues to work on his PLB and finds it helpful when he feels SOB.  His saturations are continuing to do well.  His breathing is better than he started but the heart failure continues to limit his breathing.     Goals/Expected Outcomes Short: Become more profiecient at using PLB. Long: Become independent at using PLB. Short: Continue to use PLB Long; Conitnue to work on breathing Short: Continue to use PLB Long; Conitnue to work on breathing              Oxygen Discharge (Final Oxygen Re-Evaluation):  Oxygen Re-Evaluation -  11/24/22 1011       Program Oxygen Prescription   Program Oxygen Prescription None      Home Oxygen   Home Oxygen Device None    Sleep Oxygen Prescription None    Home Exercise Oxygen Prescription None    Home Resting Oxygen Prescription None      Goals/Expected Outcomes   Short Term Goals To learn and demonstrate proper pursed lip breathing techniques or other breathing techniques. ;To learn and understand importance of monitoring SPO2 with pulse oximeter and demonstrate accurate use of the pulse oximeter.;To learn and understand  importance of maintaining oxygen saturations>88%    Long  Term Goals Exhibits proper breathing techniques, such as pursed lip breathing or other method taught during program session;Verbalizes importance of monitoring SPO2 with pulse oximeter and return demonstration;Maintenance of O2 saturations>88%;Compliance with respiratory medication    Comments Stuart Harvey continues to work on his PLB and finds it helpful when he feels SOB.  His saturations are continuing to do well.  His breathing is better than he started but the heart failure continues to limit his breathing.    Goals/Expected Outcomes Short: Continue to use PLB Long; Conitnue to work on breathing             Initial Exercise Prescription:  Initial Exercise Prescription - 09/22/22 1300       Date of Initial Exercise RX and Referring Provider   Date 09/22/22    Referring Provider [name], MD      Oxygen   Maintain Oxygen Saturation 88% or higher      Treadmill   MPH 2    Grade 0.5    Minutes 15    METs 2.67      Recumbant Bike   Level 1    RPM 50    Watts 12    Minutes 15    METs 2.38      REL-XR   Level 1    Watts 12    Speed 50    Minutes 15    METs 2.38      Prescription Details   Frequency (times per week) 2    Duration Progress to 30 minutes of continuous aerobic without signs/symptoms of physical distress      Intensity   THRR 40-80% of Max Heartrate 99-134    Ratings of Perceived Exertion 11-13    Perceived Dyspnea 0-4      Progression   Progression Continue to progress workloads to maintain intensity without signs/symptoms of physical distress.      Resistance Training   Training Prescription Yes    Weight 4 lb    Reps 10-15             Perform Capillary Blood Glucose checks as needed.  Exercise Prescription Changes:   Exercise Prescription Changes     Row Name 09/22/22 1300 10/10/22 1300 10/24/22 0900 11/01/22 1000 11/08/22 1300     Response to Exercise   Blood Pressure  (Admit) 102/60 124/70 112/64 -- 98/56   Blood Pressure (Exercise) 118/76 142/74 146/72 -- 118/62   Blood Pressure (Exit) 108/68 108/70 100/64 -- 96/52   Heart Rate (Admit) 66 bpm 78 bpm 78 bpm -- 94 bpm   Heart Rate (Exercise) 88 bpm 101 bpm 90 bpm -- 130 bpm   Heart Rate (Exit) 69 bpm 79 bpm 83 bpm -- 80 bpm   Oxygen Saturation (Admit) 96 % 98 % 97 % -- 96 %   Oxygen Saturation (Exercise) 94 %  94 % 95 % -- 90 %   Oxygen Saturation (Exit) 97 % 96 % 96 % -- 98 %   Rating of Perceived Exertion (Exercise) '11 12 12 '$ -- 12   Perceived Dyspnea (Exercise) '1 2 1 '$ -- 1   Symptoms Hip Pain 4/10 Dizziness none -- none   Comments 6MWT Results First 3 sessions of rehab -- -- --   Duration -- Progress to 30 minutes of  aerobic without signs/symptoms of physical distress Continue with 30 min of aerobic exercise without signs/symptoms of physical distress. -- Continue with 30 min of aerobic exercise without signs/symptoms of physical distress.   Intensity -- THRR unchanged THRR unchanged -- THRR unchanged     Progression   Progression -- Continue to progress workloads to maintain intensity without signs/symptoms of physical distress. Continue to progress workloads to maintain intensity without signs/symptoms of physical distress. -- Continue to progress workloads to maintain intensity without signs/symptoms of physical distress.   Average METs -- 2.66 3.24 -- 3.07     Resistance Training   Training Prescription -- Yes Yes -- Yes   Weight -- 4 lb\ 4 lb -- 6 lb   Reps -- 10-15 10-15 -- 10-15     Interval Training   Interval Training -- No No -- No     Treadmill   MPH -- 1.5 2.1 -- 2.1   Grade -- 0 0 -- 0.5   Minutes -- 15 15 -- 15   METs -- 2.15 2.61 -- 2.75     Recumbant Bike   Level -- -- 5 -- 3   Watts -- -- 33 -- 34   Minutes -- -- 15 -- 15   METs -- -- 2.39 -- --     NuStep   Level -- 3 -- -- 4   Minutes -- 15 -- -- 15   METs -- 2 -- -- 3.9     REL-XR   Level -- 1 2 -- 2   Minutes --  15 15 -- 15   METs -- 3.9 4 -- 3.5     Home Exercise Plan   Plans to continue exercise at -- -- -- Home (comment)  walking Home (comment)  walking   Frequency -- -- -- Add 3 additional days to program exercise sessions. Add 3 additional days to program exercise sessions.   Initial Home Exercises Provided -- -- -- 11/01/22 11/01/22     Oxygen   Maintain Oxygen Saturation -- 88% or higher 88% or higher -- 88% or higher    Row Name 11/21/22 1000 12/05/22 1400           Response to Exercise   Blood Pressure (Admit) 116/70 96/62      Blood Pressure (Exit) 110/60 104/70      Heart Rate (Admit) 80 bpm 52 bpm      Heart Rate (Exercise) 93 bpm 96 bpm      Heart Rate (Exit) 80 bpm 93 bpm      Oxygen Saturation (Admit) 98 % 93 %      Oxygen Saturation (Exercise) 94 % 95 %      Oxygen Saturation (Exit) 98 % 96 %      Rating of Perceived Exertion (Exercise) 11 14      Perceived Dyspnea (Exercise) 0 2      Symptoms dizziness- resolved with rest none      Duration Continue with 30 min of aerobic exercise without signs/symptoms of physical distress. Continue with 30  min of aerobic exercise without signs/symptoms of physical distress.      Intensity THRR unchanged THRR unchanged        Progression   Progression Continue to progress workloads to maintain intensity without signs/symptoms of physical distress. Continue to progress workloads to maintain intensity without signs/symptoms of physical distress.      Average METs 2.93 2.84        Resistance Training   Training Prescription Yes Yes      Weight 6 lb 6 lb      Reps 10-15 10-15        Interval Training   Interval Training No No        Treadmill   MPH 2.1 2      Grade 0 0      Minutes 15 15      METs 2.61 2.53        Recumbant Bike   Level 4 --      Watts 40 --      Minutes 15 --      METs 3.26 --        Biostep-RELP   Level -- 2      Minutes -- 15      METs -- 3        Home Exercise Plan   Plans to continue exercise at  Home (comment)  walking Home (comment)  walking      Frequency Add 3 additional days to program exercise sessions. Add 3 additional days to program exercise sessions.      Initial Home Exercises Provided 11/01/22 11/01/22        Oxygen   Maintain Oxygen Saturation 88% or higher 88% or higher               Exercise Comments:   Exercise Comments     Row Name 09/27/22 1041           Exercise Comments First full day of exercise!  Patient was oriented to gym and equipment including functions, settings, policies, and procedures.  Patient's individual exercise prescription and treatment plan were reviewed.  All starting workloads were established based on the results of the 6 minute walk test done at initial orientation visit.  The plan for exercise progression was also introduced and progression will be customized based on patient's performance and goals.                Exercise Goals and Review:   Exercise Goals     Row Name 09/22/22 1334             Exercise Goals   Increase Physical Activity Yes       Intervention Provide advice, education, support and counseling about physical activity/exercise needs.;Develop an individualized exercise prescription for aerobic and resistive training based on initial evaluation findings, risk stratification, comorbidities and participant's personal goals.       Expected Outcomes Short Term: Attend rehab on a regular basis to increase amount of physical activity.;Long Term: Add in home exercise to make exercise part of routine and to increase amount of physical activity.;Long Term: Exercising regularly at least 3-5 days a week.       Increase Strength and Stamina Yes       Intervention Provide advice, education, support and counseling about physical activity/exercise needs.;Develop an individualized exercise prescription for aerobic and resistive training based on initial evaluation findings, risk stratification, comorbidities and  participant's personal goals.       Expected Outcomes Short  Term: Increase workloads from initial exercise prescription for resistance, speed, and METs.;Short Term: Perform resistance training exercises routinely during rehab and add in resistance training at home;Long Term: Improve cardiorespiratory fitness, muscular endurance and strength as measured by increased METs and functional capacity (6MWT)       Able to understand and use rate of perceived exertion (RPE) scale Yes       Intervention Provide education and explanation on how to use RPE scale       Expected Outcomes Short Term: Able to use RPE daily in rehab to express subjective intensity level;Long Term:  Able to use RPE to guide intensity level when exercising independently       Able to understand and use Dyspnea scale Yes       Intervention Provide education and explanation on how to use Dyspnea scale       Expected Outcomes Short Term: Able to use Dyspnea scale daily in rehab to express subjective sense of shortness of breath during exertion;Long Term: Able to use Dyspnea scale to guide intensity level when exercising independently       Knowledge and understanding of Target Heart Rate Range (THRR) Yes       Intervention Provide education and explanation of THRR including how the numbers were predicted and where they are located for reference       Expected Outcomes Long Term: Able to use THRR to govern intensity when exercising independently;Short Term: Able to state/look up THRR;Short Term: Able to use daily as guideline for intensity in rehab       Able to check pulse independently Yes       Intervention Provide education and demonstration on how to check pulse in carotid and radial arteries.;Review the importance of being able to check your own pulse for safety during independent exercise       Expected Outcomes Short Term: Able to explain why pulse checking is important during independent exercise;Long Term: Able to check pulse  independently and accurately       Understanding of Exercise Prescription Yes       Intervention Provide education, explanation, and written materials on patient's individual exercise prescription       Expected Outcomes Short Term: Able to explain program exercise prescription;Long Term: Able to explain home exercise prescription to exercise independently                Exercise Goals Re-Evaluation :  Exercise Goals Re-Evaluation     Row Name 09/27/22 1041 10/10/22 1340 10/24/22 0906 10/27/22 1033 11/01/22 1002     Exercise Goal Re-Evaluation   Exercise Goals Review Able to understand and use rate of perceived exertion (RPE) scale;Able to understand and use Dyspnea scale;Knowledge and understanding of Target Heart Rate Range (THRR);Understanding of Exercise Prescription Understanding of Exercise Prescription;Increase Physical Activity;Increase Strength and Stamina Understanding of Exercise Prescription;Increase Physical Activity;Increase Strength and Stamina Understanding of Exercise Prescription;Increase Physical Activity;Increase Strength and Stamina Understanding of Exercise Prescription;Increase Physical Activity;Increase Strength and Stamina;Able to understand and use rate of perceived exertion (RPE) scale;Knowledge and understanding of Target Heart Rate Range (THRR);Able to understand and use Dyspnea scale;Able to check pulse independently   Comments Reviewed RPE scale, THR and program prescription with pt today.  Pt voiced understanding and was given a copy of goals to take home. Stuart Harvey is off to a good start in rehab. He had an overall average MET level of 2.66 METs during his first three sessions. He also did well with level 3 on  the T4 and level 1 on the XR. He started on the treadmill at 1.5 mph and no incline. We will continue to monitor his progress in the program. Stuart Harvey continues to do well in rehab. He increased to level 5 on the recumbent bike and worked up to 33 watts! He was also  able to increase his speed on the treadmill back up to 2.1 mph, which is closer to where his initial exercise prescription was. We will continue to monitor. Stuart Harvey is doing well in rehab.  He is not really doing exercise at home. He is staying active by doing yard work. He does feel like his strength and stamina are getting better. Reviewed home exercise with pt today.  Pt plans to walk and use weights at home for exercise.  Reviewed THR, pulse, RPE, sign and symptoms, pulse oximetery and when to call 911 or MD.  Also discussed weather considerations and indoor options.  Pt voiced understanding.   Expected Outcomes Short: Use RPE daily to regulate intensity. Long: Follow program prescription in THR. Short: increase speed on the treadmill. Long: Continue to increase strength and stamina. Short: Add on incline to treadmill now that speed is up Long: Continue to increase overall MET level Short: Start to add in more exercise at home Long: Continue to improve stamina Short: wallk more on off days Long: conitnue to exercise independently    [place] Name 11/08/22 1321 11/21/22 1047 11/24/22 1005 12/05/22 1433       Exercise Goal Re-Evaluation   Exercise Goals Review Increase Physical Activity;Increase Strength and Stamina;Understanding of Exercise Prescription Increase Physical Activity;Increase Strength and Stamina;Understanding of Exercise Prescription Increase Physical Activity;Increase Strength and Stamina;Understanding of Exercise Prescription Increase Physical Activity;Increase Strength and Stamina;Understanding of Exercise Prescription    Comments Stuart Harvey is doing well in rehab. He has continued to consistently work at a MET level above 3 METs. He has also added incline on the treadmill at 0.5% while maintaining a speed of 2.1 mph. He improved to level 4 on the T4, and has continued to do well with level 2 on the XR. We will continue to monitor his progress. Stuart Harvey has been out of rehab for several sessions due to  symptoms of dizziness and lightheadedness. Patient was brought to his cardiologist office after 1 rehab session due to continuous symptoms of dizziness which we thought was due to his medications as his BP was running low. His doctor saw him and made some medication changes that we hope will make him feel better overall. He did return back to rehab after that for once session and did well. He BP was stable and he felt food. He increased to level 4 on the recumbent bike and worked up to [employer] We will continue to monitor. Stuart Harvey is still having some spells but they did readjust his meds to help.  His anuerysm repair in his leg is still causing a lot of pain for him.  He is able to tolerate some walking, but walking in from parking lot is a hard on him.  He is walking some at home. Stuart Harvey is doing well in rehab. He recently began using the biostep and did well with level 2. He has also continued to do well with 6 lb hand weights for resistance training. He is walking at 2 mph on the treadmill with no incline, but may benefit from increasing his speed. We will continue to monitor his progress.    Expected Outcomes Short: Continue to increase workload  on the treadmill. Long: Continue to improve strength and stamina. Short: Continue to slowly increase loads and ease back into exercise Long: Continue to build up overall strength and stamina Short: Continue to walk to build stamina. Long: Conitnue to keep eye on leg pain. Short: Try increased speed on treadmill. Long: Continue to walk to build up stamina.             Discharge Exercise Prescription (Final Exercise Prescription Changes):  Exercise Prescription Changes - 12/05/22 1400       Response to Exercise   Blood Pressure (Admit) 96/62    Blood Pressure (Exit) 104/70    Heart Rate (Admit) 52 bpm    Heart Rate (Exercise) 96 bpm    Heart Rate (Exit) 93 bpm    Oxygen Saturation (Admit) 93 %    Oxygen Saturation (Exercise) 95 %    Oxygen Saturation  (Exit) 96 %    Rating of Perceived Exertion (Exercise) 14    Perceived Dyspnea (Exercise) 2    Symptoms none    Duration Continue with 30 min of aerobic exercise without signs/symptoms of physical distress.    Intensity THRR unchanged      Progression   Progression Continue to progress workloads to maintain intensity without signs/symptoms of physical distress.    Average METs 2.84      Resistance Training   Training Prescription Yes    Weight 6 lb    Reps 10-15      Interval Training   Interval Training No      Treadmill   MPH 2    Grade 0    Minutes 15    METs 2.53      Biostep-RELP   Level 2    Minutes 15    METs 3      Home Exercise Plan   Plans to continue exercise at Home (comment)   walking   Frequency Add 3 additional days to program exercise sessions.    Initial Home Exercises Provided 11/01/22      Oxygen   Maintain Oxygen Saturation 88% or higher             Nutrition:  Target Goals: Understanding of nutrition guidelines, daily intake of sodium '1500mg'$ , cholesterol '200mg'$ , calories 30% from fat and 7% or less from saturated fats, daily to have 5 or more servings of fruits and vegetables.  Education: All About Nutrition: -Group instruction provided by verbal, written material, interactive activities, discussions, models, and posters to present general guidelines for heart healthy nutrition including fat, fiber, MyPlate, the role of sodium in heart healthy nutrition, utilization of the nutrition label, and utilization of this knowledge for meal planning. Follow up email sent as well. Written material given at graduation. Flowsheet Row Pulmonary Rehab from 11/17/2022 in [facility] Cardiac and Pulmonary Rehab  Education need identified 09/22/22  Date 10/13/22  [Nutrition 2]  Educator [facility]  Instruction Review Code 1- Verbalizes Understanding       Biometrics:  Pre Biometrics - 09/22/22 1334       Pre Biometrics   Height '5\' 11"'$  (1.803 m)    Weight 216 lb 3.2  oz (98.1 kg)    BMI (Calculated) 30.17    Single Leg Stand 5 seconds   R             Nutrition Therapy Plan and Nutrition Goals:  Nutrition Therapy & Goals - 09/22/22 1049       Nutrition Therapy   Diet Heart healthy, low Na,  CKD stg 3 MNT    Drug/Food Interactions Statins/Certain Fruits    Protein (specify units) 85-95g    Fiber 30 grams    Whole Grain Foods 3 servings   Depending on labwork   Saturated Fats 16 max. grams    Fruits and Vegetables 8 servings/day    Sodium 1.5 grams      Personal Nutrition Goals   Nutrition Goal ST: practice MyPlate guidelines, focus on variety of plant foods LT: limit saturated fat <16g/day, limit sodium intake <1.5g/day, eat at least 30g of fiber per day, alter potassium and phosphorus intake based of of labwork for CKD.    Comments 72 y.o. M admitted to pulmonary rehab for chronic HF. PMHx includes CAD, MI 2008, HLD, HTN, CKD stg 3, AAA s/p EVAR (2008), paroxysmal A-fib, HFrEF, hypothyroidism, current tobacco use (dip), arthritis, depression, GERD, diverticulosis. PSHx includes CABG 2008, EVAR 2008 and 2014, coronary/graft angiography 2019, endovascular/stent graft 2022, bilateral TKA 2017. Relevant medications includes bupropion, vit D3, citalopram, synthroid, melatonin, omeprazole, rosuvastatin. Last GFR 08/29/22 was 42. PYP Score: 56. Vegetables & Fruits 5/12. Breads, Grains & Cereals 5/12. Red & Processed Meat 7/12. Poultry 2/2. Fish & Shellfish 0/4. Beans, Nuts & Seeds 3/4. Milk & Dairy Foods 3/6. Toppings, Oils, Seasonings & Salt 17/20. Sweets, Snacks & Restaurant Food 7/14. Beverages 7/10. He reports watching sodium B: does not usually have breakfast L: low sodium ham/turkey on sandwich (white bread) D: pork chops, chicken, spaghetti, roast beef. he likes to have baked potatoes and white beans. He reports having red meat <2x/week. He rpeorts his wife will cook dinner. He reports his wife uses liquid plant oils. Drinks: 2 cups of coffee (2 sugars),  water, sometimes sweet tea or pepsi (1-2x/day). He reports having salad every meal (cucumbers, lettuce, tomato, some cheese, [place] (2 tbsp). He reports that his wife is a great support system for him and makes sure to watch what he eats. Discussed heart healthy eating and CKD stg 3 MNT.      Intervention Plan   Intervention Prescribe, educate and counsel regarding individualized specific dietary modifications aiming towards targeted core components such as weight, hypertension, lipid management, diabetes, heart failure and other comorbidities.;Nutrition handout(s) given to patient.    Expected Outcomes Short Term Goal: Understand basic principles of dietary content, such as calories, fat, sodium, cholesterol and nutrients.;Short Term Goal: A plan has been developed with personal nutrition goals set during dietitian appointment.;Long Term Goal: Adherence to prescribed nutrition plan.             Nutrition Assessments:  MEDIFICTS Score Key: ?70 Need to make dietary changes  40-70 Heart Healthy Diet ? 40 Therapeutic Level Cholesterol Diet  Flowsheet Row Pulmonary Rehab from 09/22/2022 in [facility] Cardiac and Pulmonary Rehab  Picture Your Plate Total Score on Admission 56      Picture Your Plate Scores: <29 Unhealthy dietary pattern with much room for improvement. 41-50 Dietary pattern unlikely to meet recommendations for good health and room for improvement. 51-60 More healthful dietary pattern, with some room for improvement.  >60 Healthy dietary pattern, although there may be some specific behaviors that could be improved.   Nutrition Goals Re-Evaluation:  Nutrition Goals Re-Evaluation     [place] Name 10/27/22 1040 11/24/22 1010           Goals   Nutrition Goal ST: practice MyPlate guidelines, focus on variety of plant foods LT: limit saturated fat <16g/day, limit sodium intake <1.5g/day, eat at least 30g of fiber  per day, alter potassium and phosphorus intake based of of  labwork for CKD. Short: Continue to use MyPlate guidelines Long: Continue to get a good variety      Comment Stuart Harvey is doing well in rehab.  His diet is going well and he is getting in a good variety.  He is trying to wathin his sodium and sugar intake.  His wife is helping keep him on task with diet. Stuart Harvey is doing well in rehab.  His weight has been steady so he is feeling good with his diet.  His wife continues to make sure that he does not over do sodium or sugar.  They are also trying to get in a lot of variety into his diet.      Expected Outcome Short: Continue to use MyPlate guidelines Long: Continue to get a good variety Short: Continue to add in more variety Long: continue to focus on healthy eating               Nutrition Goals Discharge (Final Nutrition Goals Re-Evaluation):  Nutrition Goals Re-Evaluation - 11/24/22 1010       Goals   Nutrition Goal Short: Continue to use MyPlate guidelines Long: Continue to get a good variety    Comment Stuart Harvey is doing well in rehab.  His weight has been steady so he is feeling good with his diet.  His wife continues to make sure that he does not over do sodium or sugar.  They are also trying to get in a lot of variety into his diet.    Expected Outcome Short: Continue to add in more variety Long: continue to focus on healthy eating             Psychosocial: Target Goals: Acknowledge presence or absence of significant depression and/or stress, maximize coping skills, provide positive support system. Participant is able to verbalize types and ability to use techniques and skills needed for reducing stress and depression.   Education: Stress, Anxiety, and Depression - Group verbal and visual presentation to define topics covered.  Reviews how body is impacted by stress, anxiety, and depression.  Also discusses healthy ways to reduce stress and to treat/manage anxiety and depression.  Written material given at graduation. Flowsheet Row Pulmonary  Rehab from 11/17/2022 in [facility] Cardiac and Pulmonary Rehab  Date 11/17/22  Educator [facility]  Instruction Review Code 1- [employer] Understanding       Education: Sleep Hygiene -Provides group verbal and written instruction about how sleep can affect your health.  Define sleep hygiene, discuss sleep cycles and impact of sleep habits. Review good sleep hygiene tips.    Initial Review & Psychosocial Screening:  Initial Psych Review & Screening - 09/12/22 1114       Initial Review   Current issues with Current Psychotropic Meds;History of Depression      Family Dynamics   Good Support System? Yes    Comments Stuart Harvey can look to his wife for suppprt. He feels concerned for his health since he also has a kidney disease.      Barriers   Psychosocial barriers to participate in program The patient should benefit from training in stress management and relaxation.      Screening Interventions   Interventions Encouraged to exercise;To provide support and resources with identified psychosocial needs;Provide feedback about the scores to participant    Expected Outcomes Short Term goal: Utilizing psychosocial counselor, staff and physician to assist with identification of specific Stressors or current issues interfering with  healing process. Setting desired goal for each stressor or current issue identified.;Long Term Goal: Stressors or current issues are controlled or eliminated.;Short Term goal: Identification and review with participant of any Quality of Life or Depression concerns found by scoring the questionnaire.;Long Term goal: The participant improves quality of Life and PHQ9 Scores as seen by post scores and/or verbalization of changes             Quality of Life Scores:  Quality of Life - 09/22/22 1335       Quality of Life   Select Quality of Life      Quality of Life Scores   Health/Function Pre 20.27 %    Socioeconomic Pre 22.17 %    Psych/Spiritual Pre 22.93 %    Family Pre 28.5  %    GLOBAL Pre 22.42 %            Scores of 19 and below usually indicate a poorer quality of life in these areas.  A difference of  2-3 points is a clinically meaningful difference.  A difference of 2-3 points in the total score of the Quality of Life Index has been associated with significant improvement in overall quality of life, self-image, physical symptoms, and general health in studies assessing change in quality of life.  PHQ-9: Review Flowsheet  More data exists      10/11/2022 09/22/2022 08/09/2022 09/29/2021 08/06/2021  Depression screen PHQ 2/9  Decreased Interest 1 0 0 0 0  Down, Depressed, Hopeless 0 0 0 0 0  PHQ - 2 Score 1 0 0 0 0  Altered sleeping 1 1 0 0 -  Tired, decreased energy '1 1 1 1 '$ -  Change in appetite 0 0 1 0 -  Feeling bad or failure about yourself  0 0 0 0 -  Trouble concentrating 1 1 0 0 -  Moving slowly or fidgety/restless 0 0 0 0 -  Suicidal thoughts 0 0 0 0 -  PHQ-9 Score '4 3 2 1 '$ -  Difficult doing work/chores Not difficult at all Somewhat difficult Not difficult at all Not difficult at all -   Interpretation of Total Score  Total Score Depression Severity:  1-4 = Minimal depression, 5-9 = Mild depression, 10-14 = Moderate depression, 15-19 = Moderately severe depression, 20-27 = Severe depression   Psychosocial Evaluation and Intervention:  Psychosocial Evaluation - 09/12/22 1115       Psychosocial Evaluation & Interventions   Interventions Encouraged to exercise with the program and follow exercise prescription;Relaxation education;Stress management education    Comments Stuart Harvey can look to his wife for suppprt. He feels concerned for his health since he also has a kidney disease.    Expected Outcomes Short: Start [facility] to help with mood. Long: Maintain a healthy mental state    Continue Psychosocial Services  Follow up required by staff             Psychosocial Re-Evaluation:  Psychosocial Re-Evaluation     [place] Name 10/27/22  1037 11/24/22 1007           Psychosocial Re-Evaluation   Current issues with Current Stress Concerns Current Stress Concerns      Comments Stuart Harvey is doing well in rehab.  He had a tough week as he was awaiting results from his PET scan but her got good results and they did not find any cancer!!  He was very excited with these results!  He continues to sleep well ad has a good support  system at home. Stuart Harvey had some med changes to help with dizzy spells and is doing better.  He is in the donut hole with insurance and trying to pay for his medicaitons.  He continues to have pain from anuersym repair that he is constantly dealing wiht.  He sleeps good some night and not so good other nights.  He feels decent with what he is able to get.      Expected Outcomes Short: Continue to exercise for mental boost long; Continue to stay positive Short: Continue to manage pain Long: Continue to exercise for mental boost      Interventions Encouraged to attend Pulmonary Rehabilitation for the exercise Encouraged to attend Pulmonary Rehabilitation for the exercise      Continue Psychosocial Services  Follow up required by staff Follow up required by staff               Psychosocial Discharge (Final Psychosocial Re-Evaluation):  Psychosocial Re-Evaluation - 11/24/22 1007       Psychosocial Re-Evaluation   Current issues with Current Stress Concerns    Comments Stuart Harvey had some med changes to help with dizzy spells and is doing better.  He is in the donut hole with insurance and trying to pay for his medicaitons.  He continues to have pain from anuersym repair that he is constantly dealing wiht.  He sleeps good some night and not so good other nights.  He feels decent with what he is able to get.    Expected Outcomes Short: Continue to manage pain Long: Continue to exercise for mental boost    Interventions Encouraged to attend Pulmonary Rehabilitation for the exercise    Continue Psychosocial Services  Follow  up required by staff             Education: Education Goals: Education classes will be provided on a weekly basis, covering required topics. Participant will state understanding/return demonstration of topics presented.  Learning Barriers/Preferences:  Learning Barriers/Preferences - 09/12/22 1113       Learning Barriers/Preferences   Learning Barriers None    Learning Preferences None             General Pulmonary Education Topics:  Infection Prevention: - Provides verbal and written material to individual with discussion of infection control including proper hand washing and proper equipment cleaning during exercise session. Flowsheet Row Pulmonary Rehab from 11/17/2022 in [facility] Cardiac and Pulmonary Rehab  Date 09/12/22  Educator [facility]  Instruction Review Code 1- Verbalizes Understanding       Falls Prevention: - Provides verbal and written material to individual with discussion of falls prevention and safety. Flowsheet Row Pulmonary Rehab from 11/17/2022 in [facility] Cardiac and Pulmonary Rehab  Date 09/12/22  Educator [facility]  Instruction Review Code 1- Verbalizes Understanding       Chronic Lung Disease Review: - Group verbal instruction with posters, models, PowerPoint presentations and videos,  to review new updates, new respiratory medications, new advancements in procedures and treatments. Providing information on websites and "800" numbers for continued self-education. Includes information about supplement oxygen, available portable oxygen systems, continuous and intermittent flow rates, oxygen safety, concentrators, and Medicare reimbursement for oxygen. Explanation of Pulmonary Drugs, including class, frequency, complications, importance of spacers, rinsing mouth after steroid MDI's, and proper cleaning methods for nebulizers. Review of basic lung anatomy and physiology related to function, structure, and complications of lung disease. Review of risk factors. Discussion  about methods for diagnosing sleep apnea and types of masks and machines  for OSA. Includes a review of the use of types of environmental controls: home humidity, furnaces, filters, dust mite/pet prevention, HEPA vacuums. Discussion about weather changes, air quality and the benefits of nasal washing. Instruction on Warning signs, infection symptoms, calling MD promptly, preventive modes, and value of vaccinations. Review of effective airway clearance, coughing and/or vibration techniques. Emphasizing that all should Create an Action Plan. Written material given at graduation.   AED/CPR: - Group verbal and written instruction with the use of models to demonstrate the basic use of the AED with the basic ABC's of resuscitation.    Anatomy and Cardiac Procedures: - Group verbal and visual presentation and models provide information about basic cardiac anatomy and function. Reviews the testing methods done to diagnose heart disease and the outcomes of the test results. Describes the treatment choices: Medical Management, Angioplasty, or Coronary Bypass Surgery for treating various heart conditions including Myocardial Infarction, Angina, Valve Disease, and Cardiac Arrhythmias.  Written material given at graduation. Flowsheet Row Pulmonary Rehab from 11/17/2022 in [facility] Cardiac and Pulmonary Rehab  Education need identified 09/22/22       Medication Safety: - Group verbal and visual instruction to review commonly prescribed medications for heart and lung disease. Reviews the medication, class of the drug, and side effects. Includes the steps to properly store meds and maintain the prescription regimen.  Written material given at graduation.   Other: -Provides group and verbal instruction on various topics (see comments)   Knowledge Questionnaire Score:  Knowledge Questionnaire Score - 09/22/22 1336       Knowledge Questionnaire Score   Pre Score 22/26              Core Components/Risk  Factors/Patient Goals at Admission:  Personal Goals and Risk Factors at Admission - 09/22/22 1336       Core Components/Risk Factors/Patient Goals on Admission    Weight Management Yes;Weight Maintenance    Intervention Weight Management: Develop a combined nutrition and exercise program designed to reach desired caloric intake, while maintaining appropriate intake of nutrient and fiber, sodium and fats, and appropriate energy expenditure required for the weight goal.;Weight Management: Provide education and appropriate resources to help participant work on and attain dietary goals.;Weight Management/Obesity: Establish reasonable short term and long term weight goals.    Admit Weight 216 lb 3.2 oz (98.1 kg)    Goal Weight: Short Term 210 lb (95.3 kg)    Goal Weight: Long Term 200 lb (90.7 kg)    Expected Outcomes Weight Maintenance: Understanding of the daily nutrition guidelines, which includes 25-35% calories from fat, 7% or less cal from saturated fats, less than '200mg'$  cholesterol, less than 1.5gm of sodium, & 5 or more servings of fruits and vegetables daily;Long Term: Adherence to nutrition and physical activity/exercise program aimed toward attainment of established weight goal;Short Term: Continue to assess and modify interventions until short term weight is achieved;Understanding recommendations for meals to include 15-35% energy as protein, 25-35% energy from fat, 35-60% energy from carbohydrates, less than '200mg'$  of dietary cholesterol, 20-35 gm of total fiber daily;Understanding of distribution of calorie intake throughout the day with the consumption of 4-5 meals/snacks    Improve shortness of breath with ADL's Yes    Intervention Provide education, individualized exercise plan and daily activity instruction to help decrease symptoms of SOB with activities of daily living.    Expected Outcomes Short Term: Improve cardiorespiratory fitness to achieve a reduction of symptoms when performing  ADLs;Long Term: Be able to perform more  ADLs without symptoms or delay the onset of symptoms    Hypertension Yes    Intervention Provide education on lifestyle modifcations including regular physical activity/exercise, weight management, moderate sodium restriction and increased consumption of fresh fruit, vegetables, and low fat dairy, alcohol moderation, and smoking cessation.;Monitor prescription use compliance.    Expected Outcomes Short Term: Continued assessment and intervention until BP is < 140/49m HG in hypertensive participants. < 130/868mHG in hypertensive participants with diabetes, heart failure or chronic kidney disease.;Long Term: Maintenance of blood pressure at goal levels.    Lipids Yes    Intervention Provide education and support for participant on nutrition & aerobic/resistive exercise along with prescribed medications to achieve LDL '70mg'$ , HDL >'40mg'$ .    Expected Outcomes Short Term: Participant states understanding of desired cholesterol values and is compliant with medications prescribed. Participant is following exercise prescription and nutrition guidelines.;Long Term: Cholesterol controlled with medications as prescribed, with individualized exercise RX and with personalized nutrition plan. Value goals: LDL < '70mg'$ , HDL > 40 mg.             Education:Diabetes - Individual verbal and written instruction to review signs/symptoms of diabetes, desired ranges of glucose level fasting, after meals and with exercise. Acknowledge that pre and post exercise glucose checks will be done for 3 sessions at entry of program.   Know Your Numbers and Heart Failure: - Group verbal and visual instruction to discuss disease risk factors for cardiac and pulmonary disease and treatment options.  Reviews associated critical values for Overweight/Obesity, Hypertension, Cholesterol, and Diabetes.  Discusses basics of heart failure: signs/symptoms and treatments.  Introduces Heart Failure Zone  chart for action plan for heart failure.  Written material given at graduation.   Core Components/Risk Factors/Patient Goals Review:   Goals and Risk Factor Review     Row Name 10/27/22 1041 11/24/22 1002           Core Components/Risk Factors/Patient Goals Review   Personal Goals Review Weight Management/Obesity;Increase knowledge of respiratory medications and ability to use respiratory devices properly.;Hypertension;Improve shortness of breath with ADL's Weight Management/Obesity;Increase knowledge of respiratory medications and ability to use respiratory devices properly.;Hypertension;Improve shortness of breath with ADL's      Review Stuart Harvey doing well in rehab.  His weight is steady.  Pressures are doing well, they have always been on the lower end of normal. His breathing is doing okay.  It has gotten better since starting.  He is doing well with his meds. Stuart Harvey doing well in rehab.  He has been working with his doctor to balance out his pressures.  They decreased his metoprolol and it seems to be helping some.  He still feels like it is running low but has not had as many dizzy spells.  He is still having some SOB but it is getting better.  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 His weight is still steady.      Expected Outcomes SHort: Conitnue to keep close eye on BP to not get too low Long; Conitnue to montior risk factors Short: Conitnue to monitor BP closely Long; Continue to monitor risk factors.               Core Components/Risk Factors/Patient Goals at Discharge (Final Review):   Goals and Risk Factor Review - 11/24/22 1002       Core Components/Risk Factors/Patient Goals Review   Personal Goals Review Weight Management/Obesity;Increase knowledge of respiratory medications and ability  to use respiratory devices properly.;Hypertension;Improve shortness of breath with ADL's    Review Stuart Harvey is doing well in rehab.  He has been working with  his doctor to balance out his pressures.  They decreased his metoprolol and it seems to be helping some.  He still feels like it is running low but has not had as many dizzy spells.  He is still having some SOB but it is getting better.  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 His weight is still steady.    Expected Outcomes Short: Conitnue to monitor BP closely Long; Continue to monitor risk factors.             ITP Comments:  ITP Comments     Row Name 09/12/22 1111 09/22/22 1105 09/27/22 1041 10/12/22 0818 11/09/22 1031   ITP Comments Virtual Visit completed. Patient informed on EP and RD appointment and 6 Minute walk test. Patient also informed of patient health questionnaires on My Chart. Patient Verbalizes understanding. Visit diagnosis can be found in [facility] 08/26/2022. Completed 6MWT and gym orientation. Initial ITP created and sent for review to Dr. Emily Filbert, Medical Director. First full day of exercise!  Patient was oriented to gym and equipment including functions, settings, policies, and procedures.  Patient's individual exercise prescription and treatment plan were reviewed.  All starting workloads were established based on the results of the 6 minute walk test done at initial orientation visit.  The plan for exercise progression was also introduced and progression will be customized based on patient's performance and goals. 30 Day review completed. Medical Director ITP review done, changes made as directed, and signed approval by Medical Director.   New to program 30 Day review completed. Medical Director ITP review done, changes made as directed, and signed approval by Medical Director.    [place] Name 12/07/22 0952           ITP Comments 30 Day review completed. Medical Director ITP review done, changes made as directed, and signed approval by Medical Director.                Comments:

## 2022-12-08 ENCOUNTER — Encounter: Payer: Medicare HMO | Admitting: *Deleted

## 2022-12-08 DIAGNOSIS — I5022 Chronic systolic (congestive) heart failure: Secondary | ICD-10-CM

## 2022-12-08 NOTE — Progress Notes (Signed)
Daily Session Note  Patient Details  Name: Stuart Harvey MRN: [id number] Date of Birth: 1951/08/12 Referring Provider:   [name] Pulmonary Rehab from 09/22/2022 in [facility] Cardiac and Pulmonary Rehab  Referring Provider [name], MD       Encounter Date: 12/08/2022  Check In:  Session Check In - 12/08/22 1000       Check-In   Supervising physician immediately available to respond to emergencies See telemetry face sheet for immediately available ER MD    Location [facility]-Cardiac & Pulmonary Rehab    Staff Present Darlyne Russian, RN, ADN;Meredith Sherryll Burger, RN Abel Presto, MS, ACSM CEP, Exercise Physiologist;Joseph Tessie Fass, [place]    Virtual Visit No    Medication changes reported     No    Fall or balance concerns reported    No    Warm-up and Cool-down Performed on first and last piece of equipment    Resistance Training Performed Yes    VAD Patient? No    PAD/SET Patient? No      Pain Assessment   Currently in Pain? No/denies                Social History   Tobacco Use  Smoking Status Former   Packs/day: 1.00   Years: 35.00   Total pack years: 35.00   Types: Cigarettes   Quit date: 12/16/2005   Years since quitting: 16.9   Passive exposure: Past  Smokeless Tobacco Current   Types: Chew  Tobacco Comments   pt states he very rarely uses smokeless tobacco     Goals Met:  Independence with exercise equipment Exercise tolerated well No report of concerns or symptoms today Strength training completed today  Goals Unmet:  Not Applicable  Comments: Pt able to follow exercise prescription today without complaint.  Will continue to monitor for progression.    Dr. Emily Filbert is Medical Director for [place].  Dr. Ottie Glazier is Medical Director for [facility] Pulmonary Rehabilitation.

## 2022-12-15 ENCOUNTER — Encounter: Payer: Medicare HMO | Attending: Cardiovascular Disease | Admitting: *Deleted

## 2022-12-15 DIAGNOSIS — I5022 Chronic systolic (congestive) heart failure: Secondary | ICD-10-CM | POA: Insufficient documentation

## 2022-12-15 NOTE — Progress Notes (Signed)
Daily Session Note  Patient Details  Name: Stuart Harvey MRN: [id number] Date of Birth: Oct 04, 1951 Referring Provider:   Flowsheet Row Pulmonary Rehab from 09/22/2022 in [facility] Cardiac and Pulmonary Rehab  Referring Provider Stuart Sacramento, MD       Encounter Date: 12/15/2022  Check In:  Session Check In - 12/15/22 1011       Check-In   Supervising physician immediately available to respond to emergencies See telemetry face sheet for immediately available ER MD    Location [facility]-Cardiac & Pulmonary Rehab    Staff Present Darlyne Russian, RN, Lorin Mercy, MS, ACSM CEP, Exercise Physiologist;Joseph Tessie Fass, [place]    Virtual Visit No    Medication changes reported     No    Fall or balance concerns reported    No    Warm-up and Cool-down Performed on first and last piece of equipment    Resistance Training Performed Yes    VAD Patient? No    PAD/SET Patient? No      Pain Assessment   Currently in Pain? No/denies                Social History   Tobacco Use  Smoking Status Former   Packs/day: 1.00   Years: 35.00   Total pack years: 35.00   Types: Cigarettes   Quit date: 12/16/2005   Years since quitting: 17.0   Passive exposure: Past  Smokeless Tobacco Current   Types: Chew  Tobacco Comments   pt states he very rarely uses smokeless tobacco     Goals Met:  Independence with exercise equipment Exercise tolerated well No report of concerns or symptoms today Strength training completed today  Goals Unmet:  Not Applicable  Comments: Pt able to follow exercise prescription today without complaint.  Will continue to monitor for progression.    Dr. Emily Harvey is Medical Director for [place].  Dr. Ottie Harvey is Medical Director for [facility] Pulmonary Rehabilitation.

## 2022-12-20 ENCOUNTER — Encounter: Payer: Medicare HMO | Admitting: *Deleted

## 2022-12-20 DIAGNOSIS — I5022 Chronic systolic (congestive) heart failure: Secondary | ICD-10-CM | POA: Diagnosis not present

## 2022-12-20 NOTE — Progress Notes (Signed)
Daily Session Note  Patient Details  Name: RYSHAWN SANZONE MRN: [id number] Date of Birth: [birth year] Referring Provider:   [name] Pulmonary Rehab from 09/22/2022 in [facility] Cardiac and Pulmonary Rehab  Referring Provider [name], MD       Encounter Date: 12/20/2022  Check In:  Session Check In - 12/20/22 1039       Check-In   Supervising physician immediately available to respond to emergencies See telemetry face sheet for immediately available ER MD    Location [facility]-Cardiac & Pulmonary Rehab    Staff Present Heath Lark, RN, BSN, CCRP;Jessica [place], MA, RCEP, CCRP, Bertram Gala, MS, ACSM CEP, Exercise Physiologist    Virtual Visit No    Medication changes reported     No    Fall or balance concerns reported    No    Warm-up and Cool-down Performed on first and last piece of equipment    Resistance Training Performed Yes    VAD Patient? No    PAD/SET Patient? No      Pain Assessment   Currently in Pain? No/denies                Social History   Tobacco Use  Smoking Status Former   Packs/day: 1.00   Years: 35.00   Total pack years: 35.00   Types: Cigarettes   Quit date: 12/16/2005   Years since quitting: 17.0   Passive exposure: Past  Smokeless Tobacco Current   Types: Chew  Tobacco Comments   pt states he very rarely uses smokeless tobacco     Goals Met:  Proper associated with RPD/PD & O2 Sat Independence with exercise equipment Exercise tolerated well No report of concerns or symptoms today  Goals Unmet:  Not Applicable  Comments: Pt able to follow exercise prescription today without complaint.  Will continue to monitor for progression.    Dr. Emily Filbert is Medical Director for [place].  Dr. Ottie Glazier is Medical Director for [facility] Pulmonary Rehabilitation.

## 2022-12-22 ENCOUNTER — Encounter: Payer: Medicare HMO | Admitting: *Deleted

## 2022-12-22 DIAGNOSIS — I5022 Chronic systolic (congestive) heart failure: Secondary | ICD-10-CM

## 2022-12-22 NOTE — Telephone Encounter (Signed)
My Chart message sent to patient and list of blood pressure readings sent to batch scanning.    Rise Mu, PA-C     12/22/22 12:30 PM Blood pressures at heart track are overall stable with several low readings in the 53X systolic (13 total).  There is no contraindication to taking medications later in the day.  Keep up the great work.

## 2022-12-22 NOTE — Telephone Encounter (Signed)
Patient dropped off BP readings placed in box.

## 2022-12-22 NOTE — Progress Notes (Signed)
Daily Session Note  Patient Details  Name: Stuart Harvey MRN: [id number] Date of Birth: 25-Nov-1950 Referring Provider:   Flowsheet Row Pulmonary Rehab from 09/22/2022 in [facility] Cardiac and Pulmonary Rehab  Referring Provider [name], MD       Encounter Date: 12/22/2022  Check In:  Session Check In - 12/22/22 1004       Check-In   Supervising physician immediately available to respond to emergencies See telemetry face sheet for immediately available ER MD    Location [facility]-Cardiac & Pulmonary Rehab    Staff Present Darlyne Russian, RN, Lorin Mercy, MS, ACSM CEP, Exercise Physiologist;Noah Tickle, BS, Exercise Physiologist    Virtual Visit No    Medication changes reported     No    Fall or balance concerns reported    No    Warm-up and Cool-down Performed on first and last piece of equipment    Resistance Training Performed Yes    VAD Patient? No    PAD/SET Patient? No      Pain Assessment   Currently in Pain? No/denies                Social History   Tobacco Use  Smoking Status Former   Packs/day: 1.00   Years: 35.00   Total pack years: 35.00   Types: Cigarettes   Quit date: 12/16/2005   Years since quitting: 17.0   Passive exposure: Past  Smokeless Tobacco Current   Types: Chew  Tobacco Comments   pt states he very rarely uses smokeless tobacco     Goals Met:  Independence with exercise equipment Exercise tolerated well No report of concerns or symptoms today Strength training completed today  Goals Unmet:  Not Applicable  Comments: Pt able to follow exercise prescription today without complaint.  Will continue to monitor for progression.    Dr. Emily Filbert is Medical Director for [place].  Dr. Ottie Glazier is Medical Director for [facility] Pulmonary Rehabilitation.

## 2022-12-22 NOTE — Telephone Encounter (Signed)
Patient sent My Chart message with concerns about his blood pressures when doing rehab. Today we received a list of readings during his HearTrack session. Routing message and list of readings for his review and any further recommendations.

## 2022-12-27 ENCOUNTER — Encounter: Payer: Medicare HMO | Admitting: *Deleted

## 2022-12-27 DIAGNOSIS — I5022 Chronic systolic (congestive) heart failure: Secondary | ICD-10-CM | POA: Diagnosis not present

## 2022-12-27 NOTE — Progress Notes (Signed)
Daily Session Note  Patient Details  Name: Stuart Harvey MRN: [id number] Date of Birth: 09-30-1951 Referring Provider:   [name] Pulmonary Rehab from 09/22/2022 in [facility] Cardiac and Pulmonary Rehab  Referring Provider [name], MD       Encounter Date: 12/27/2022  Check In:  Session Check In - 12/27/22 1045       Check-In   Supervising physician immediately available to respond to emergencies See telemetry face sheet for immediately available ER MD    Location [facility]-Cardiac & Pulmonary Rehab    Staff Present Heath Lark, RN, BSN, CCRP;Jessica [place], MA, RCEP, CCRP, Bertram Gala, MS, ACSM CEP, Exercise Physiologist    Virtual Visit No    Medication changes reported     No    Fall or balance concerns reported    No    Warm-up and Cool-down Performed on first and last piece of equipment    Resistance Training Performed Yes    VAD Patient? No    PAD/SET Patient? No      Pain Assessment   Currently in Pain? No/denies                Social History   Tobacco Use  Smoking Status Former   Packs/day: 1.00   Years: 35.00   Total pack years: 35.00   Types: Cigarettes   Quit date: 12/16/2005   Years since quitting: 17.0   Passive exposure: Past  Smokeless Tobacco Current   Types: Chew  Tobacco Comments   pt states he very rarely uses smokeless tobacco     Goals Met:  Proper associated with RPD/PD & O2 Sat Independence with exercise equipment Exercise tolerated well No report of concerns or symptoms today  Goals Unmet:  Not Applicable  Comments: Pt able to follow exercise prescription today without complaint.  Will continue to monitor for progression.    Dr. Emily Filbert is Medical Director for [place].  Dr. Ottie Glazier is Medical Director for [facility] Pulmonary Rehabilitation.

## 2022-12-28 ENCOUNTER — Ambulatory Visit: Payer: Medicare HMO | Admitting: Urology

## 2023-01-03 ENCOUNTER — Encounter: Payer: Medicare HMO | Admitting: *Deleted

## 2023-01-03 DIAGNOSIS — I5022 Chronic systolic (congestive) heart failure: Secondary | ICD-10-CM | POA: Diagnosis not present

## 2023-01-03 NOTE — Progress Notes (Signed)
Daily Session Note  Patient Details  Name: Stuart Harvey MRN: [id number] Date of Birth: 08-07-1951 Referring Provider:   [name] Pulmonary Rehab from 09/22/2022 in [facility] Cardiac and Pulmonary Rehab  Referring Provider [name], MD       Encounter Date: 01/03/2023  Check In:  Session Check In - 01/03/23 1222       Check-In   Supervising physician immediately available to respond to emergencies See telemetry face sheet for immediately available ER MD    Location [facility]-Cardiac & Pulmonary Rehab    Staff Present Nyoka Cowden, RN, BSN, Melina Schools, MS, ACSM CEP, Exercise Physiologist;Uziel Covault Luan Pulling, MA, RCEP, CCRP, CCET    Virtual Visit No    Medication changes reported     No    Fall or balance concerns reported    No    Warm-up and Cool-down Performed on first and last piece of equipment    Resistance Training Performed Yes    VAD Patient? No    PAD/SET Patient? No      Pain Assessment   Currently in Pain? No/denies                Social History   Tobacco Use  Smoking Status Former   Packs/day: 1.00   Years: 35.00   Total pack years: 35.00   Types: Cigarettes   Quit date: 12/16/2005   Years since quitting: 17.0   Passive exposure: Past  Smokeless Tobacco Current   Types: Chew  Tobacco Comments   pt states he very rarely uses smokeless tobacco     Goals Met:  Proper associated with RPD/PD & O2 Sat Independence with exercise equipment Exercise tolerated well No report of concerns or symptoms today Strength training completed today  Goals Unmet:  Not Applicable  Comments: Pt able to follow exercise prescription today without complaint.  Will continue to monitor for progression.    Dr. Emily Filbert is Medical Director for [place].  Dr. Ottie Glazier is Medical Director for [facility] Pulmonary Rehabilitation.

## 2023-01-04 ENCOUNTER — Encounter: Payer: Self-pay | Admitting: *Deleted

## 2023-01-04 DIAGNOSIS — I5022 Chronic systolic (congestive) heart failure: Secondary | ICD-10-CM

## 2023-01-04 NOTE — Progress Notes (Signed)
Pulmonary Individual Treatment Plan  Patient Details  Name: Stuart Harvey MRN: [id number] Date of Birth: 01/13/1951 Referring Provider:   Flowsheet Row Pulmonary Rehab from 09/22/2022 in [facility] Cardiac and Pulmonary Rehab  Referring Provider [name], MD       Initial Encounter Date:  Flowsheet Row Pulmonary Rehab from 09/22/2022 in [facility] Cardiac and Pulmonary Rehab  Date 09/22/22       Visit Diagnosis: Heart failure, chronic systolic ([place])  Patient's Home Medications on Admission:  Current Outpatient Medications:    apixaban (ELIQUIS) 5 MG TABS tablet, Take 1 tablet (5 mg total) by mouth 2 (two) times daily., Disp: 180 tablet, Rfl: 3   buPROPion (WELLBUTRIN SR) 200 MG 12 hr tablet, Take 1 tablet (200 mg total) by mouth 2 (two) times daily., Disp: 180 tablet, Rfl: 4   Cholecalciferol 50 MCG (2000 UT) CAPS, Take 1 capsule by mouth daily. , Disp: , Rfl:    citalopram (CELEXA) 40 MG tablet, Take 1 tablet (40 mg total) by mouth daily., Disp: 90 tablet, Rfl: 0   ezetimibe (ZETIA) 10 MG tablet, Take 1 tablet (10 mg total) by mouth daily., Disp: 90 tablet, Rfl: 1   levothyroxine (SYNTHROID) 75 MCG tablet, Take 1 tablet (75 mcg total) by mouth daily., Disp: 90 tablet, Rfl: 0   metoprolol succinate (TOPROL XL) 25 MG 24 hr tablet, Take 0.5 tablets (12.5 mg total) by mouth daily., Disp: 90 tablet, Rfl: 3   omeprazole (PRILOSEC) 20 MG capsule, Take 1 capsule (20 mg total) by mouth daily., Disp: 90 capsule, Rfl: 0   rosuvastatin (CRESTOR) 40 MG tablet, Take 1 tablet (40 mg total) by mouth daily., Disp: 90 tablet, Rfl: 2   sacubitril-valsartan (ENTRESTO) 24-26 MG, Take 1 tablet by mouth 2 (two) times daily., Disp: 60 tablet, Rfl: 11  Past Medical History: Past Medical History:  Diagnosis Date   AAA (abdominal aortic aneurysm) ([place])    a.) s/p EVAR in 08/2007 by Dr. Hulda Humphrey. b.) CTA on 06/30/2021 --> interval aneurysmal dilitation superior to stent graft with the juxtarenal aorta measuring  3.8 cm.   Anemia    Arthritis    both knees   Chronic anticoagulation    Apixaban   CKD (chronic kidney disease), stage II    Coronary artery disease    a.) 4v CABG @ [facility] 06/2007 (LIMA-LAD, VG-RPDA, VG-OM3,VG-D2); b.) LHC 2011: patent grafts EF 40%, c.) LHC 06/05/18: LM nl, pLAD 50%, p-mLAD 50%, ostD1 70%, ost ramus 30%, ost-pLCx 40%, pLCx 100% (chronically occluded), OM3 100%, p-dRCA 100% (chronically occluded), LIMA-LAD patent, VG-RPDA patent, VG-OM3 mid-graft 60% and 80%   Depression    GERD (gastroesophageal reflux disease)    Hernia of abdominal cavity    History of 2019 novel coronavirus disease (COVID-19) 09/2019   Hyperlipidemia    Hypertension    Hypothyroidism    Iliac artery aneurysm, right ([place])    a.) at the distal landing zone of the right iliac limb measuring 3 cm   Right middle lobe pulmonary nodule 06/30/2021   a.) measured 8 mm by CT on 06/30/2021.   S/P CABG x 4 06/25/2007   a.) LIMA-LAD, SVG-RPDA, SVG-OM3, SVG-D2   Sigmoid diverticulosis    Ventricular tachycardia ([place]) 05/2018   a.) found unresponsive by EMS; VT noted --> defibrillation achieved ROSC. Briefly intubated. Episode felt to be secondary to heat stoke.    Tobacco Use: Social History   Tobacco Use  Smoking Status Former   Packs/day: 1.00   Years: 35.00  Total pack years: 35.00   Types: Cigarettes   Quit date: 12/16/2005   Years since quitting: 17.0   Passive exposure: Past  Smokeless Tobacco Current   Types: Chew  Tobacco Comments   pt states he very rarely uses smokeless tobacco     Labs: Review Flowsheet  More data exists      Latest Ref Rng & Units 01/05/2021 02/18/2021 09/22/2021 03/29/2022 10/11/2022  Labs for ITP Cardiac and Pulmonary Rehab  Cholestrol 100 - 199 mg/dL 171  127  145  142  146   LDL (calc) 0 - 99 mg/dL 104  58  77  74  80   HDL-C >39 mg/dL 52  45  47  49  46   Trlycerides 0 - 149 mg/dL 83  138  114  105  112   Hemoglobin A1c 4.8 - 5.6 % 5.7  - - - -     Pulmonary  Assessment Scores:  Pulmonary Assessment Scores     Row Name 09/22/22 1338 09/27/22 1131       ADL UCSD   ADL Phase Entry Entry    SOB Score total -- 37    Rest -- 1    Walk -- 1    Stairs -- 2    Bath -- 1    Dress -- 1    Shop -- 2      CAT Score   CAT Score -- 13      mMRC Score   mMRC Score 1 --             UCSD: Self-administered rating of dyspnea associated with activities of daily living (ADLs) 6-point scale (0 = "not at all" to 5 = "maximal or unable to do because of breathlessness")  Scoring Scores range from 0 to 120.  Minimally important difference is 5 units  CAT: CAT can identify the health impairment of COPD patients and is better correlated with disease progression.  CAT has a scoring range of zero to 40. The CAT score is classified into four groups of low (less than 10), medium (10 - 20), high (21-30) and very high (31-40) based on the impact level of disease on health status. A CAT score over 10 suggests significant symptoms.  A worsening CAT score could be explained by an exacerbation, poor medication adherence, poor inhaler technique, or progression of COPD or comorbid conditions.  CAT MCID is 2 points  mMRC: mMRC (Modified Medical Research Council) Dyspnea Scale is used to assess the degree of baseline functional disability in patients of respiratory disease due to dyspnea. No minimal important difference is established. A decrease in score of 1 point or greater is considered a positive change.   Pulmonary Function Assessment:  Pulmonary Function Assessment - 09/12/22 1113       Breath   Shortness of Breath Yes;Limiting activity             Exercise Target Goals: Exercise Program Goal: Individual exercise prescription set using results from initial 6 min walk test and THRR while considering  patient's activity barriers and safety.   Exercise Prescription Goal: Initial exercise prescription builds to 30-45 minutes a day of aerobic  activity, 2-3 days per week.  Home exercise guidelines will be given to patient during program as part of exercise prescription that the participant will acknowledge.  Education: Aerobic Exercise: - Group verbal and visual presentation on the components of exercise prescription. Introduces F.I.T.T principle from ACSM for exercise prescriptions.  Reviews F.I.T.T.  principles of aerobic exercise including progression. Written material given at graduation. Flowsheet Row Pulmonary Rehab from 11/17/2022 in [facility] Cardiac and Pulmonary Rehab  Education need identified 09/22/22       Education: Resistance Exercise: - Group verbal and visual presentation on the components of exercise prescription. Introduces F.I.T.T principle from ACSM for exercise prescriptions  Reviews F.I.T.T. principles of resistance exercise including progression. Written material given at graduation. Flowsheet Row Pulmonary Rehab from 11/17/2022 in [facility] Cardiac and Pulmonary Rehab  Date 10/04/22  Educator NT  Instruction Review Code 1- Verbalizes Understanding        Education: Exercise & Equipment Safety: - Individual verbal instruction and demonstration of equipment use and safety with use of the equipment. Flowsheet Row Pulmonary Rehab from 11/17/2022 in [facility] Cardiac and Pulmonary Rehab  Date 09/12/22  Educator [facility]  Instruction Review Code 1- Verbalizes Understanding       Education: Exercise Physiology & General Exercise Guidelines: - Group verbal and written instruction with models to review the exercise physiology of the cardiovascular system and associated critical values. Provides general exercise guidelines with specific guidelines to those with heart or lung disease.    Education: Flexibility, Balance, Mind/Body Relaxation: - Group verbal and visual presentation with interactive activity on the components of exercise prescription. Introduces F.I.T.T principle from ACSM for exercise prescriptions. Reviews F.I.T.T.  principles of flexibility and balance exercise training including progression. Also discusses the mind body connection.  Reviews various relaxation techniques to help reduce and manage stress (i.e. Deep breathing, progressive muscle relaxation, and visualization). Balance handout provided to take home. Written material given at graduation. Flowsheet Row Pulmonary Rehab from 11/17/2022 in [facility] Cardiac and Pulmonary Rehab  Date 10/04/22  Educator NT  Instruction Review Code 1- Verbalizes Understanding       Activity Barriers & Risk Stratification:  Activity Barriers & Cardiac Risk Stratification - 09/22/22 1327       Activity Barriers & Cardiac Risk Stratification   Activity Barriers Left Knee Replacement;Right Hip Replacement;Other (comment);Shortness of Breath    Comments Hip Pain from Aneurysm    Cardiac Risk Stratification High             6 Minute Walk:  6 Minute Walk     Row Name 11/21/22 1338         6 Minute Walk   Phase Initial  Completed on 09/22/2022     Distance 1215 feet     Walk Time 6 minutes     # of Rest Breaks 0     MPH 2.3     METS 2.38     RPE 11     Perceived Dyspnea  1     VO2 Peak 8.35     Symptoms Yes (comment)     Comments Hip pain 4/10     Resting HR 66 bpm     Resting BP 102/60     Resting Oxygen Saturation  96 %     Exercise Oxygen Saturation  during 6 min walk 94 %     Max Ex. HR 88 bpm     Max Ex. BP 118/76     2 Minute Post BP 108/68             Oxygen Initial Assessment:  Oxygen Initial Assessment - 09/12/22 1112       Home Oxygen   Home Oxygen Device None    Sleep Oxygen Prescription None    Home Exercise Oxygen Prescription None    Home Resting  Oxygen Prescription None      Initial 6 min Walk   Oxygen Used None      Program Oxygen Prescription   Program Oxygen Prescription None      Intervention   Short Term Goals To learn and understand importance of monitoring SPO2 with pulse oximeter and demonstrate accurate use  of the pulse oximeter.;To learn and understand importance of maintaining oxygen saturations>88%;To learn and demonstrate proper pursed lip breathing techniques or other breathing techniques. ;To learn and exhibit compliance with exercise, home and travel O2 prescription    Long  Term Goals Verbalizes importance of monitoring SPO2 with pulse oximeter and return demonstration;Maintenance of O2 saturations>88%;Exhibits proper breathing techniques, such as pursed lip breathing or other method taught during program session;Compliance with respiratory medication;Exhibits compliance with exercise, home  and travel O2 prescription             Oxygen Re-Evaluation:  Oxygen Re-Evaluation     Row Name 09/27/22 1042 10/27/22 1043 11/24/22 1011 12/20/22 1011       Program Oxygen Prescription   Program Oxygen Prescription None None None None      Home Oxygen   Home Oxygen Device None None None None    Sleep Oxygen Prescription None None None None    Home Exercise Oxygen Prescription None None None None    Home Resting Oxygen Prescription None None None None      Goals/Expected Outcomes   Short Term Goals To learn and demonstrate proper pursed lip breathing techniques or other breathing techniques.  To learn and demonstrate proper pursed lip breathing techniques or other breathing techniques. ;To learn and understand importance of monitoring SPO2 with pulse oximeter and demonstrate accurate use of the pulse oximeter.;To learn and understand importance of maintaining oxygen saturations>88% To learn and demonstrate proper pursed lip breathing techniques or other breathing techniques. ;To learn and understand importance of monitoring SPO2 with pulse oximeter and demonstrate accurate use of the pulse oximeter.;To learn and understand importance of maintaining oxygen saturations>88% To learn and demonstrate proper pursed lip breathing techniques or other breathing techniques.     Long  Term Goals Exhibits  proper breathing techniques, such as pursed lip breathing or other method taught during program session Exhibits proper breathing techniques, such as pursed lip breathing or other method taught during program session;Verbalizes importance of monitoring SPO2 with pulse oximeter and return demonstration;Maintenance of O2 saturations>88%;Compliance with respiratory medication Exhibits proper breathing techniques, such as pursed lip breathing or other method taught during program session;Verbalizes importance of monitoring SPO2 with pulse oximeter and return demonstration;Maintenance of O2 saturations>88%;Compliance with respiratory medication Exhibits proper breathing techniques, such as pursed lip breathing or other method taught during program session    Comments Reviewed PLB technique with pt.  Talked about how it works and it's importance in maintaining their exercise saturations. Stuart Harvey is doing well in rehab.  He is doing well with using his PLB.  His saturations are doing well and he tries to keep any eye on them.  He feels that his breathing is starting to improve. Stuart Harvey continues to work on his PLB and finds it helpful when he feels SOB.  His saturations are continuing to do well.  His breathing is better than he started but the heart failure continues to limit his breathing. Diaphragmatic and PLB breathing explained and performed with patient. Patient has a better understanding of how to do these exercises to help with breathing performance and relaxation. Patient performed breathing techniques adequately and to  practice further at home.    Goals/Expected Outcomes Short: Become more profiecient at using PLB. Long: Become independent at using PLB. Short: Continue to use PLB Long; Conitnue to work on breathing Short: Continue to use PLB Long; Conitnue to work on breathing Short: practice PLB and diaphragmatic breathing at home. Long: Use PLB and diaphragmatic breathing independently post [employer].              Oxygen Discharge (Final Oxygen Re-Evaluation):  Oxygen Re-Evaluation - 12/20/22 1011       Program Oxygen Prescription   Program Oxygen Prescription None      Home Oxygen   Home Oxygen Device None    Sleep Oxygen Prescription None    Home Exercise Oxygen Prescription None    Home Resting Oxygen Prescription None      Goals/Expected Outcomes   Short Term Goals To learn and demonstrate proper pursed lip breathing techniques or other breathing techniques.     Long  Term Goals Exhibits proper breathing techniques, such as pursed lip breathing or other method taught during program session    Comments Diaphragmatic and PLB breathing explained and performed with patient. Patient has a better understanding of how to do these exercises to help with breathing performance and relaxation. Patient performed breathing techniques adequately and to practice further at home.    Goals/Expected Outcomes Short: practice PLB and diaphragmatic breathing at home. Long: Use PLB and diaphragmatic breathing independently post [employer].             Initial Exercise Prescription:  Initial Exercise Prescription - 09/22/22 1300       Date of Initial Exercise RX and Referring Provider   Date 09/22/22    Referring Provider [name], MD      Oxygen   Maintain Oxygen Saturation 88% or higher      Treadmill   MPH 2    Grade 0.5    Minutes 15    METs 2.67      Recumbant Bike   Level 1    RPM 50    Watts 12    Minutes 15    METs 2.38      REL-XR   Level 1    Watts 12    Speed 50    Minutes 15    METs 2.38      Prescription Details   Frequency (times per week) 2    Duration Progress to 30 minutes of continuous aerobic without signs/symptoms of physical distress      Intensity   THRR 40-80% of Max Heartrate 99-134    Ratings of Perceived Exertion 11-13    Perceived Dyspnea 0-4      Progression   Progression Continue to progress workloads to maintain intensity without  signs/symptoms of physical distress.      Resistance Training   Training Prescription Yes    Weight 4 lb    Reps 10-15             Perform Capillary Blood Glucose checks as needed.  Exercise Prescription Changes:   Exercise Prescription Changes     Row Name 09/22/22 1300 10/10/22 1300 10/24/22 0900 11/01/22 1000 11/08/22 1300     Response to Exercise   Blood Pressure (Admit) 102/60 124/70 112/64 -- 98/56   Blood Pressure (Exercise) 118/76 142/74 146/72 -- 118/62   Blood Pressure (Exit) 108/68 108/70 100/64 -- 96/52   Heart Rate (Admit) 66 bpm 78 bpm 78 bpm -- 94 bpm   Heart Rate (Exercise)  88 bpm 101 bpm 90 bpm -- 130 bpm   Heart Rate (Exit) 69 bpm 79 bpm 83 bpm -- 80 bpm   Oxygen Saturation (Admit) 96 % 98 % 97 % -- 96 %   Oxygen Saturation (Exercise) 94 % 94 % 95 % -- 90 %   Oxygen Saturation (Exit) 97 % 96 % 96 % -- 98 %   Rating of Perceived Exertion (Exercise) 11 12 12 $ -- 12   Perceived Dyspnea (Exercise) 1 2 1 $ -- 1   Symptoms Hip Pain 4/10 Dizziness none -- none   Comments 6MWT Results First 3 sessions of rehab -- -- --   Duration -- Progress to 30 minutes of  aerobic without signs/symptoms of physical distress Continue with 30 min of aerobic exercise without signs/symptoms of physical distress. -- Continue with 30 min of aerobic exercise without signs/symptoms of physical distress.   Intensity -- THRR unchanged THRR unchanged -- THRR unchanged     Progression   Progression -- Continue to progress workloads to maintain intensity without signs/symptoms of physical distress. Continue to progress workloads to maintain intensity without signs/symptoms of physical distress. -- Continue to progress workloads to maintain intensity without signs/symptoms of physical distress.   Average METs -- 2.66 3.24 -- 3.07     Resistance Training   Training Prescription -- Yes Yes -- Yes   Weight -- 4 lb\ 4 lb -- 6 lb   Reps -- 10-15 10-15 -- 10-15     Interval Training   Interval  Training -- No No -- No     Treadmill   MPH -- 1.5 2.1 -- 2.1   Grade -- 0 0 -- 0.5   Minutes -- 15 15 -- 15   METs -- 2.15 2.61 -- 2.75     Recumbant Bike   Level -- -- 5 -- 3   Watts -- -- 33 -- 34   Minutes -- -- 15 -- 15   METs -- -- 2.39 -- --     NuStep   Level -- 3 -- -- 4   Minutes -- 15 -- -- 15   METs -- 2 -- -- 3.9     REL-XR   Level -- 1 2 -- 2   Minutes -- 15 15 -- 15   METs -- 3.9 4 -- 3.5     Home Exercise Plan   Plans to continue exercise at -- -- -- Home (comment)  walking Home (comment)  walking   Frequency -- -- -- Add 3 additional days to program exercise sessions. Add 3 additional days to program exercise sessions.   Initial Home Exercises Provided -- -- -- 11/01/22 11/01/22     Oxygen   Maintain Oxygen Saturation -- 88% or higher 88% or higher -- 88% or higher    Row Name 11/21/22 1000 12/05/22 1400 12/19/22 1100 01/02/23 1300       Response to Exercise   Blood Pressure (Admit) 116/70 96/62 104/64 124/64    Blood Pressure (Exercise) -- -- 120/58 --    Blood Pressure (Exit) 110/60 104/70 96/60 104/64    Heart Rate (Admit) 80 bpm 52 bpm 81 bpm 78 bpm    Heart Rate (Exercise) 93 bpm 96 bpm 98 bpm 120 bpm    Heart Rate (Exit) 80 bpm 93 bpm 85 bpm 89 bpm    Oxygen Saturation (Admit) 98 % 93 % 99 % 97 %    Oxygen Saturation (Exercise) 94 % 95 % 97 % 94 %  Oxygen Saturation (Exit) 98 % 96 % 96 % 96 %    Rating of Perceived Exertion (Exercise) 11 14 13 13    $ Perceived Dyspnea (Exercise) 0 2 2 2    $ Symptoms dizziness- resolved with rest none SOB SOB    Duration Continue with 30 min of aerobic exercise without signs/symptoms of physical distress. Continue with 30 min of aerobic exercise without signs/symptoms of physical distress. Continue with 30 min of aerobic exercise without signs/symptoms of physical distress. Continue with 30 min of aerobic exercise without signs/symptoms of physical distress.    Intensity THRR unchanged THRR unchanged THRR  unchanged THRR unchanged      Progression   Progression Continue to progress workloads to maintain intensity without signs/symptoms of physical distress. Continue to progress workloads to maintain intensity without signs/symptoms of physical distress. Continue to progress workloads to maintain intensity without signs/symptoms of physical distress. Continue to progress workloads to maintain intensity without signs/symptoms of physical distress.    Average METs 2.93 2.84 3.3 3.92      Resistance Training   Training Prescription Yes Yes Yes Yes    Weight 6 lb 6 lb 5 lb 6 lb    Reps 10-15 10-15 10-15 10-15      Interval Training   Interval Training No No No No      Treadmill   MPH 2.1 2 2.2 2.2    Grade 0 0 1.5 1.5    Minutes 15 15 15 15    $ METs 2.61 2.53 3.14 3.14      Recumbant Bike   Level 4 -- 5 --    Watts 40 -- 47 --    Minutes 15 -- 15 --    METs 3.26 -- 3.51 --      NuStep   Level -- -- -- 4    Minutes -- -- -- 15    METs -- -- -- 8.2      Recumbant Elliptical   Level -- -- -- 1    Minutes -- -- -- 15    METs -- -- -- 1.8      REL-XR   Level -- -- -- 4    Minutes -- -- -- 15    METs -- -- -- 4.1      Biostep-RELP   Level -- 2 4 --    Minutes -- 15 15 --    METs -- 3 4 --      Home Exercise Plan   Plans to continue exercise at Home (comment)  walking Home (comment)  walking Home (comment)  walking Home (comment)  walking    Frequency Add 3 additional days to program exercise sessions. Add 3 additional days to program exercise sessions. Add 3 additional days to program exercise sessions. Add 3 additional days to program exercise sessions.    Initial Home Exercises Provided 11/01/22 11/01/22 11/01/22 11/01/22      Oxygen   Maintain Oxygen Saturation 88% or higher 88% or higher 88% or higher 88% or higher             Exercise Comments:   Exercise Comments     Row Name 09/27/22 1041           Exercise Comments First full day of exercise!  Patient  was oriented to gym and equipment including functions, settings, policies, and procedures.  Patient's individual exercise prescription and treatment plan were reviewed.  All starting workloads were established based on the results of the 6 minute  walk test done at initial orientation visit.  The plan for exercise progression was also introduced and progression will be customized based on patient's performance and goals.                Exercise Goals and Review:   Exercise Goals     Row Name 09/22/22 1334             Exercise Goals   Increase Physical Activity Yes       Intervention Provide advice, education, support and counseling about physical activity/exercise needs.;Develop an individualized exercise prescription for aerobic and resistive training based on initial evaluation findings, risk stratification, comorbidities and participant's personal goals.       Expected Outcomes Short Term: Attend rehab on a regular basis to increase amount of physical activity.;Long Term: Add in home exercise to make exercise part of routine and to increase amount of physical activity.;Long Term: Exercising regularly at least 3-5 days a week.       Increase Strength and Stamina Yes       Intervention Provide advice, education, support and counseling about physical activity/exercise needs.;Develop an individualized exercise prescription for aerobic and resistive training based on initial evaluation findings, risk stratification, comorbidities and participant's personal goals.       Expected Outcomes Short Term: Increase workloads from initial exercise prescription for resistance, speed, and METs.;Short Term: Perform resistance training exercises routinely during rehab and add in resistance training at home;Long Term: Improve cardiorespiratory fitness, muscular endurance and strength as measured by increased METs and functional capacity (6MWT)       Able to understand and use rate of perceived exertion (RPE)  scale Yes       Intervention Provide education and explanation on how to use RPE scale       Expected Outcomes Short Term: Able to use RPE daily in rehab to express subjective intensity level;Long Term:  Able to use RPE to guide intensity level when exercising independently       Able to understand and use Dyspnea scale Yes       Intervention Provide education and explanation on how to use Dyspnea scale       Expected Outcomes Short Term: Able to use Dyspnea scale daily in rehab to express subjective sense of shortness of breath during exertion;Long Term: Able to use Dyspnea scale to guide intensity level when exercising independently       Knowledge and understanding of Target Heart Rate Range (THRR) Yes       Intervention Provide education and explanation of THRR including how the numbers were predicted and where they are located for reference       Expected Outcomes Long Term: Able to use THRR to govern intensity when exercising independently;Short Term: Able to state/look up THRR;Short Term: Able to use daily as guideline for intensity in rehab       Able to check pulse independently Yes       Intervention Provide education and demonstration on how to check pulse in carotid and radial arteries.;Review the importance of being able to check your own pulse for safety during independent exercise       Expected Outcomes Short Term: Able to explain why pulse checking is important during independent exercise;Long Term: Able to check pulse independently and accurately       Understanding of Exercise Prescription Yes       Intervention Provide education, explanation, and written materials on patient's individual exercise prescription  Expected Outcomes Short Term: Able to explain program exercise prescription;Long Term: Able to explain home exercise prescription to exercise independently                Exercise Goals Re-Evaluation :  Exercise Goals Re-Evaluation     Row Name 09/27/22 1041  10/10/22 1340 10/24/22 0906 10/27/22 1033 11/01/22 1002     Exercise Goal Re-Evaluation   Exercise Goals Review Able to understand and use rate of perceived exertion (RPE) scale;Able to understand and use Dyspnea scale;Knowledge and understanding of Target Heart Rate Range (THRR);Understanding of Exercise Prescription Understanding of Exercise Prescription;Increase Physical Activity;Increase Strength and Stamina Understanding of Exercise Prescription;Increase Physical Activity;Increase Strength and Stamina Understanding of Exercise Prescription;Increase Physical Activity;Increase Strength and Stamina Understanding of Exercise Prescription;Increase Physical Activity;Increase Strength and Stamina;Able to understand and use rate of perceived exertion (RPE) scale;Knowledge and understanding of Target Heart Rate Range (THRR);Able to understand and use Dyspnea scale;Able to check pulse independently   Comments Reviewed RPE scale, THR and program prescription with pt today.  Pt voiced understanding and was given a copy of goals to take home. Stuart Harvey is off to a good start in rehab. He had an overall average MET level of 2.66 METs during his first three sessions. He also did well with level 3 on the T4 and level 1 on the XR. He started on the treadmill at 1.5 mph and no incline. We will continue to monitor his progress in the program. Stuart Harvey continues to do well in rehab. He increased to level 5 on the recumbent bike and worked up to 33 watts! He was also able to increase his speed on the treadmill back up to 2.1 mph, which is closer to where his initial exercise prescription was. We will continue to monitor. Stuart Harvey is doing well in rehab.  He is not really doing exercise at home. He is staying active by doing yard work. He does feel like his strength and stamina are getting better. Reviewed home exercise with pt today.  Pt plans to walk and use weights at home for exercise.  Reviewed THR, pulse, RPE, sign and symptoms,  pulse oximetery and when to call 911 or MD.  Also discussed weather considerations and indoor options.  Pt voiced understanding.   Expected Outcomes Short: Use RPE daily to regulate intensity. Long: Follow program prescription in THR. Short: increase speed on the treadmill. Long: Continue to increase strength and stamina. Short: Add on incline to treadmill now that speed is up Long: Continue to increase overall MET level Short: Start to add in more exercise at home Long: Continue to improve stamina Short: wallk more on off days Long: conitnue to exercise independently    Row Name 11/08/22 1321 11/21/22 1047 11/24/22 1005 12/05/22 1433 12/19/22 1122     Exercise Goal Re-Evaluation   Exercise Goals Review Increase Physical Activity;Increase Strength and Stamina;Understanding of Exercise Prescription Increase Physical Activity;Increase Strength and Stamina;Understanding of Exercise Prescription Increase Physical Activity;Increase Strength and Stamina;Understanding of Exercise Prescription Increase Physical Activity;Increase Strength and Stamina;Understanding of Exercise Prescription Increase Physical Activity;Increase Strength and Stamina;Understanding of Exercise Prescription   Comments Stuart Harvey is doing well in rehab. He has continued to consistently work at a MET level above 3 METs. He has also added incline on the treadmill at 0.5% while maintaining a speed of 2.1 mph. He improved to level 4 on the T4, and has continued to do well with level 2 on the XR. We will continue to monitor his progress. Stuart Harvey has  been out of rehab for several sessions due to symptoms of dizziness and lightheadedness. Patient was brought to his cardiologist office after 1 rehab session due to continuous symptoms of dizziness which we thought was due to his medications as his BP was running low. His doctor saw him and made some medication changes that we hope will make him feel better overall. He did return back to rehab after that for  once session and did well. He BP was stable and he felt food. He increased to level 4 on the recumbent bike and worked up to [employer] We will continue to monitor. Stuart Harvey is still having some spells but they did readjust his meds to help.  His anuerysm repair in his leg is still causing a lot of pain for him.  He is able to tolerate some walking, but walking in from parking lot is a hard on him.  He is walking some at home. Stuart Harvey is doing well in rehab. He recently began using the biostep and did well with level 2. He has also continued to do well with 6 lb hand weights for resistance training. He is walking at 2 mph on the treadmill with no incline, but may benefit from increasing his speed. We will continue to monitor his progress. Stuart Harvey continues to do well in rehab. He has been experiencing some low BP but not as often before and is not often symptomatic with them. He has been staying hydrated and still in contact with his doctor. He increased on his levels on both the Biostep and recumbent bike, specifically where he has worked up to 47 watts! He also increased his workload on the treadmill to a 2.2 mph/1.5% incline. RPEs are staying in appropriate range. We will continue to monitor.   Expected Outcomes Short: Continue to increase workload on the treadmill. Long: Continue to improve strength and stamina. Short: Continue to slowly increase loads and ease back into exercise Long: Continue to build up overall strength and stamina Short: Continue to walk to build stamina. Long: Conitnue to keep eye on leg pain. Short: Try increased speed on treadmill. Long: Continue to walk to build up stamina. Short: Continue to increase incline on treadmill Long: Continue to increase overall MET level and stamina    Row Name 01/02/23 1337             Exercise Goal Re-Evaluation   Exercise Goals Review Increase Physical Activity;Increase Strength and Stamina;Understanding of Exercise Prescription       Comments Stuart Harvey is  doing well in rehab. He recently increased his overall average MET level to 3.92 METs. He also improved to level 4 on the XR and has consistently walked on the treadmill at a speed of 2.2 mph and an incline of 1.5%. He increased from 5 lb to 6 lb hand weights for resistance training as well. We will continue to monitor his progress in the program.       Expected Outcomes Short: Continue to increase treadmill workload. Long: Continue to improve strength and stamina.                Discharge Exercise Prescription (Final Exercise Prescription Changes):  Exercise Prescription Changes - 01/02/23 1300       Response to Exercise   Blood Pressure (Admit) 124/64    Blood Pressure (Exit) 104/64    Heart Rate (Admit) 78 bpm    Heart Rate (Exercise) 120 bpm    Heart Rate (Exit) 89 bpm  Oxygen Saturation (Admit) 97 %    Oxygen Saturation (Exercise) 94 %    Oxygen Saturation (Exit) 96 %    Rating of Perceived Exertion (Exercise) 13    Perceived Dyspnea (Exercise) 2    Symptoms SOB    Duration Continue with 30 min of aerobic exercise without signs/symptoms of physical distress.    Intensity THRR unchanged      Progression   Progression Continue to progress workloads to maintain intensity without signs/symptoms of physical distress.    Average METs 3.92      Resistance Training   Training Prescription Yes    Weight 6 lb    Reps 10-15      Interval Training   Interval Training No      Treadmill   MPH 2.2    Grade 1.5    Minutes 15    METs 3.14      NuStep   Level 4    Minutes 15    METs 8.2      Recumbant Elliptical   Level 1    Minutes 15    METs 1.8      REL-XR   Level 4    Minutes 15    METs 4.1      Home Exercise Plan   Plans to continue exercise at Home (comment)   walking   Frequency Add 3 additional days to program exercise sessions.    Initial Home Exercises Provided 11/01/22      Oxygen   Maintain Oxygen Saturation 88% or higher              Nutrition:  Target Goals: Understanding of nutrition guidelines, daily intake of sodium <1574m, cholesterol <2037m calories 30% from fat and 7% or less from saturated fats, daily to have 5 or more servings of fruits and vegetables.  Education: All About Nutrition: -Group instruction provided by verbal, written material, interactive activities, discussions, models, and posters to present general guidelines for heart healthy nutrition including fat, fiber, MyPlate, the role of sodium in heart healthy nutrition, utilization of the nutrition label, and utilization of this knowledge for meal planning. Follow up email sent as well. Written material given at graduation. Flowsheet [facility]  Education need identified 09/22/22  Date 10/13/22  [Nutrition 2]  [facility] 1- Verbalizes Understanding       Biometrics:  Pre Biometrics - 09/22/22 1334       Pre Biometrics   Height 5' 11"$  (1.803 m)    Weight 216 lb 3.2 oz (98.1 kg)    BMI (Calculated) 30.17    Single Leg Stand 5 seconds   R             Nutrition Therapy Plan and Nutrition Goals:  Nutrition Therapy & Goals - 09/22/22 1049       Nutrition Therapy   Diet Heart healthy, low Na, CKD stg 3 MNT    Drug/Food Interactions Statins/Certain Fruits    Protein (specify units) 85-95g    Fiber 30 grams    Whole Grain Foods 3 servings   Depending on labwork   Saturated Fats 16 max. grams    Fruits and Vegetables 8 servings/day    Sodium 1.5 grams      Personal Nutrition Goals   Nutrition Goal ST: practice MyPlate guidelines, focus on variety of plant foods LT: limit saturated fat <16g/day, limit sodium intake <1.5g/day, eat at least 30g of  fiber per day, alter potassium and phosphorus intake based of of labwork for CKD.    Comments 72 y.o. M admitted to pulmonary rehab for chronic HF. PMHx includes CAD, MI 2008, HLD, HTN, CKD stg 3, AAA s/p  EVAR (2008), paroxysmal A-fib, HFrEF, hypothyroidism, current tobacco use (dip), arthritis, depression, GERD, diverticulosis. PSHx includes CABG 2008, EVAR 2008 and 2014, coronary/graft angiography 2019, endovascular/stent graft 2022, bilateral TKA 2017. Relevant medications includes bupropion, vit D3, citalopram, synthroid, melatonin, omeprazole, rosuvastatin. Last GFR 08/29/22 was 42. PYP Score: 56. Vegetables & Fruits 5/12. Breads, Grains & Cereals 5/12. Red & Processed Meat 7/12. Poultry 2/2. Fish & Shellfish 0/4. Beans, Nuts & Seeds 3/4. Milk & Dairy Foods 3/6. Toppings, Oils, Seasonings & Salt 17/20. Sweets, Snacks & Restaurant Food 7/14. Beverages 7/10. He reports watching sodium B: does not usually have breakfast L: low sodium ham/turkey on sandwich (white bread) D: pork chops, chicken, spaghetti, roast beef. he likes to have baked potatoes and white beans. He reports having red meat <2x/week. He rpeorts his wife will cook dinner. He reports his wife uses liquid plant oils. Drinks: 2 cups of coffee (2 sugars), water, sometimes sweet tea or pepsi (1-2x/day). He reports having salad every meal (cucumbers, lettuce, tomato, some cheese, [place] (2 tbsp). He reports that his wife is a great support system for him and makes sure to watch what he eats. Discussed heart healthy eating and CKD stg 3 MNT.      Intervention Plan   Intervention Prescribe, educate and counsel regarding individualized specific dietary modifications aiming towards targeted core components such as weight, hypertension, lipid management, diabetes, heart failure and other comorbidities.;Nutrition handout(s) given to patient.    Expected Outcomes Short Term Goal: Understand basic principles of dietary content, such as calories, fat, sodium, cholesterol and nutrients.;Short Term Goal: A plan has been developed with personal nutrition goals set during dietitian appointment.;Long Term Goal: Adherence to prescribed nutrition plan.              Nutrition Assessments:  MEDIFICTS Score Key: ?70 Need to make dietary changes  40-70 Heart Healthy Diet ? 40 Therapeutic Level Cholesterol Diet  Flowsheet Row Pulmonary Rehab from 09/22/2022 in [facility] and Pulmonary Rehab  Picture Your Plate Total Score on Admission 56      Picture Your Plate Scores: [id number] Unhealthy dietary pattern with much room for improvement. 41-50 Dietary pattern unlikely to meet recommendations for good health and room for improvement. 51-60 More healthful dietary pattern, with some room for improvement.  >60 Healthy dietary pattern, although there may be some specific behaviors that could be improved.   Nutrition Goals Re-Evaluation:  Nutrition Goals Re-Evaluation     Bruno Name 10/27/22 1040 11/24/22 1010           Goals   Nutrition Goal ST: practice MyPlate guidelines, focus on variety of plant foods LT: limit saturated fat <16g/day, limit sodium intake <1.5g/day, eat at least 30g of fiber per day, alter potassium and phosphorus intake based of of labwork for CKD. Short: Continue to use MyPlate guidelines Long: Continue to get a good variety      Comment Stuart Harvey is doing well in rehab.  His diet is going well and he is getting in a good variety.  He is trying to wathin his sodium and sugar intake.  His wife is helping keep him on task with diet. Stuart Harvey is doing well in rehab.  His weight has been steady so he is feeling good with his  diet.  His wife continues to make sure that he does not over do sodium or sugar.  They are also trying to get in a lot of variety into his diet.      Expected Outcome Short: Continue to use MyPlate guidelines Long: Continue to get a good variety Short: Continue to add in more variety Long: continue to focus on healthy eating               Nutrition Goals Discharge (Final Nutrition Goals Re-Evaluation):  Nutrition Goals Re-Evaluation - 11/24/22 1010       Goals   Nutrition Goal Short: Continue to use MyPlate  guidelines Long: Continue to get a good variety    Comment Stuart Harvey is doing well in rehab.  His weight has been steady so he is feeling good with his diet.  His wife continues to make sure that he does not over do sodium or sugar.  They are also trying to get in a lot of variety into his diet.    Expected Outcome Short: Continue to add in more variety Long: continue to focus on healthy eating             Psychosocial: Target Goals: Acknowledge presence or absence of significant depression and/or stress, maximize coping skills, provide positive support system. Participant is able to verbalize types and ability to use techniques and skills needed for reducing stress and depression.   Education: Stress, Anxiety, and Depression - Group verbal and visual presentation to define topics covered.  Reviews how body is impacted by stress, anxiety, and depression.  Also discusses healthy ways to reduce stress and to treat/manage anxiety and depression.  Written material given at graduation. Flowsheet Row Pulmonary Rehab from 11/17/2022 in [facility] Cardiac and Pulmonary Rehab  Date 11/17/22  Educator [facility]  Instruction Review Code 1- [employer] Understanding       Education: Sleep Hygiene -Provides group verbal and written instruction about how sleep can affect your health.  Define sleep hygiene, discuss sleep cycles and impact of sleep habits. Review good sleep hygiene tips.    Initial Review & Psychosocial Screening:  Initial Psych Review & Screening - 09/12/22 1114       Initial Review   Current issues with Current Psychotropic Meds;History of Depression      Family Dynamics   Good Support System? Yes    Comments Stuart Harvey can look to his wife for suppprt. He feels concerned for his health since he also has a kidney disease.      Barriers   Psychosocial barriers to participate in program The patient should benefit from training in stress management and relaxation.      Screening Interventions    Interventions Encouraged to exercise;To provide support and resources with identified psychosocial needs;Provide feedback about the scores to participant    Expected Outcomes Short Term goal: Utilizing psychosocial counselor, staff and physician to assist with identification of specific Stressors or current issues interfering with healing process. Setting desired goal for each stressor or current issue identified.;Long Term Goal: Stressors or current issues are controlled or eliminated.;Short Term goal: Identification and review with participant of any Quality of Life or Depression concerns found by scoring the questionnaire.;Long Term goal: The participant improves quality of Life and PHQ9 Scores as seen by post scores and/or verbalization of changes             Quality of Life Scores:  Quality of Life - 09/22/22 1335       Quality of  Life   Select Quality of Life      Quality of Life Scores   Health/Function Pre 20.27 %    Socioeconomic Pre 22.17 %    Psych/Spiritual Pre 22.93 %    Family Pre 28.5 %    GLOBAL Pre 22.42 %            Scores of 19 and below usually indicate a poorer quality of life in these areas.  A difference of  2-3 points is a clinically meaningful difference.  A difference of 2-3 points in the total score of the Quality of Life Index has been associated with significant improvement in overall quality of life, self-image, physical symptoms, and general health in studies assessing change in quality of life.  PHQ-9: Review Flowsheet  More data exists      10/11/2022 09/22/2022 08/09/2022 09/29/2021 08/06/2021  Depression screen PHQ 2/9  Decreased Interest 1 0 0 0 0  Down, Depressed, Hopeless 0 0 0 0 0  PHQ - 2 Score 1 0 0 0 0  Altered sleeping 1 1 0 0 -  Tired, decreased energy 1 1 1 1 $ -  Change in appetite 0 0 1 0 -  Feeling bad or failure about yourself  0 0 0 0 -  Trouble concentrating 1 1 0 0 -  Moving slowly or fidgety/restless 0 0 0 0 -  Suicidal  thoughts 0 0 0 0 -  PHQ-9 Score 4 3 2 1 $ -  Difficult doing work/chores Not difficult at all Somewhat difficult Not difficult at all Not difficult at all -   Interpretation of Total Score  Total Score Depression Severity:  1-4 = Minimal depression, 5-9 = Mild depression, 10-14 = Moderate depression, 15-19 = Moderately severe depression, 20-27 = Severe depression   Psychosocial Evaluation and Intervention:  Psychosocial Evaluation - 09/12/22 1115       Psychosocial Evaluation & Interventions   Interventions Encouraged to exercise with the program and follow exercise prescription;Relaxation education;Stress management education    Comments Stuart Harvey can look to his wife for suppprt. He feels concerned for his health since he also has a kidney disease.    Expected Outcomes Short: Start [facility] to help with mood. Long: Maintain a healthy mental state    Continue Psychosocial Services  Follow up required by staff             Psychosocial Re-Evaluation:  Psychosocial Re-Evaluation     [place] Name 10/27/22 1037 11/24/22 1007 12/20/22 1013         Psychosocial Re-Evaluation   Current issues with Current Stress Concerns Current Stress Concerns Current Stress Concerns;Current Psychotropic Meds;History of Depression     Comments Stuart Harvey is doing well in rehab.  He had a tough week as he was awaiting results from his PET scan but her got good results and they did not find any cancer!!  He was very excited with these results!  He continues to sleep well ad has a good support system at home. Stuart Harvey had some med changes to help with dizzy spells and is doing better.  He is in the donut hole with insurance and trying to pay for his medicaitons.  He continues to have pain from anuersym repair that he is constantly dealing wiht.  He sleeps good some night and not so good other nights.  He feels decent with what he is able to get. Stuart Harvey feels less dizzy and more improved since he is switching his medications  up. He is  going to talk to his doctor about his blood pressure medications and possible cut the doses down. His blood pressure being low was getting to his mood. We checked his blood pressure manualy agains his automatic cuff. The readings were the same. He did not take his blood pressure medication to see if he would not be so low and  dizzy. He is talking to his doctor and wants to continue getting better.     Expected Outcomes Short: Continue to exercise for mental boost long; Continue to stay positive Short: Continue to manage pain Long: Continue to exercise for mental boost Short: Continue to manage BP medications. Long: Continue to maintain BP medcations and mood independently.     Interventions Encouraged to attend Pulmonary Rehabilitation for the exercise Encouraged to attend Pulmonary Rehabilitation for the exercise Encouraged to attend Pulmonary Rehabilitation for the exercise     Continue Psychosocial Services  Follow up required by staff Follow up required by staff Follow up required by staff              Psychosocial Discharge (Final Psychosocial Re-Evaluation):  Psychosocial Re-Evaluation - 12/20/22 1013       Psychosocial Re-Evaluation   Current issues with Current Stress Concerns;Current Psychotropic Meds;History of Depression    Comments Stuart Harvey feels less dizzy and more improved since he is switching his medications up. He is going to talk to his doctor about his blood pressure medications and possible cut the doses down. His blood pressure being low was getting to his mood. We checked his blood pressure manualy agains his automatic cuff. The readings were the same. He did not take his blood pressure medication to see if he would not be so low and  dizzy. He is talking to his doctor and wants to continue getting better.    Expected Outcomes Short: Continue to manage BP medications. Long: Continue to maintain BP medcations and mood independently.    Interventions Encouraged to  attend Pulmonary Rehabilitation for the exercise    Continue Psychosocial Services  Follow up required by staff             Education: Education Goals: Education classes will be provided on a weekly basis, covering required topics. Participant will state understanding/return demonstration of topics presented.  Learning Barriers/Preferences:  Learning Barriers/Preferences - 09/12/22 1113       Learning Barriers/Preferences   Learning Barriers None    Learning Preferences None             General Pulmonary Education Topics:  Infection Prevention: - Provides verbal and written material to individual with discussion of infection control including proper hand washing and proper equipment cleaning during exercise session. Flowsheet Row Pulmonary Rehab from 11/17/2022 in [facility] Cardiac and Pulmonary Rehab  Date 09/12/22  Educator [facility]  Instruction Review Code 1- Verbalizes Understanding       Falls Prevention: - Provides verbal and written material to individual with discussion of falls prevention and safety. Flowsheet Row Pulmonary Rehab from 11/17/2022 in [facility] Cardiac and Pulmonary Rehab  Date 09/12/22  Educator [facility]  Instruction Review Code 1- Verbalizes Understanding       Chronic Lung Disease Review: - Group verbal instruction with posters, models, PowerPoint presentations and videos,  to review new updates, new respiratory medications, new advancements in procedures and treatments. Providing information on websites and "800" numbers for continued self-education. Includes information about supplement oxygen, available portable oxygen systems, continuous and intermittent flow rates, oxygen safety, concentrators, and Medicare reimbursement for oxygen.  Explanation of Pulmonary Drugs, including class, frequency, complications, importance of spacers, rinsing mouth after steroid MDI's, and proper cleaning methods for nebulizers. Review of basic lung anatomy and physiology related to  function, structure, and complications of lung disease. Review of risk factors. Discussion about methods for diagnosing sleep apnea and types of masks and machines for OSA. Includes a review of the use of types of environmental controls: home humidity, furnaces, filters, dust mite/pet prevention, HEPA vacuums. Discussion about weather changes, air quality and the benefits of nasal washing. Instruction on Warning signs, infection symptoms, calling MD promptly, preventive modes, and value of vaccinations. Review of effective airway clearance, coughing and/or vibration techniques. Emphasizing that all should Create an Action Plan. Written material given at graduation.   AED/CPR: - Group verbal and written instruction with the use of models to demonstrate the basic use of the AED with the basic ABC's of resuscitation.    Anatomy and Cardiac Procedures: - Group verbal and visual presentation and models provide information about basic cardiac anatomy and function. Reviews the testing methods done to diagnose heart disease and the outcomes of the test results. Describes the treatment choices: Medical Management, Angioplasty, or Coronary Bypass Surgery for treating various heart conditions including Myocardial Infarction, Angina, Valve Disease, and Cardiac Arrhythmias.  Written material given at graduation. Flowsheet Row Pulmonary Rehab from 11/17/2022 in [facility] Cardiac and Pulmonary Rehab  Education need identified 09/22/22       Medication Safety: - Group verbal and visual instruction to review commonly prescribed medications for heart and lung disease. Reviews the medication, class of the drug, and side effects. Includes the steps to properly store meds and maintain the prescription regimen.  Written material given at graduation.   Other: -Provides group and verbal instruction on various topics (see comments)   Knowledge Questionnaire Score:  Knowledge Questionnaire Score - 09/22/22 1336        Knowledge Questionnaire Score   Pre Score 22/26              Core Components/Risk Factors/Patient Goals at Admission:  Personal Goals and Risk Factors at Admission - 09/22/22 1336       Core Components/Risk Factors/Patient Goals on Admission    Weight Management Yes;Weight Maintenance    Intervention Weight Management: Develop a combined nutrition and exercise program designed to reach desired caloric intake, while maintaining appropriate intake of nutrient and fiber, sodium and fats, and appropriate energy expenditure required for the weight goal.;Weight Management: Provide education and appropriate resources to help participant work on and attain dietary goals.;Weight Management/Obesity: Establish reasonable short term and long term weight goals.    Admit Weight 216 lb 3.2 oz (98.1 kg)    Goal Weight: Short Term 210 lb (95.3 kg)    Goal Weight: Long Term 200 lb (90.7 kg)    Expected Outcomes Weight Maintenance: Understanding of the daily nutrition guidelines, which includes 25-35% calories from fat, 7% or less cal from saturated fats, less than 249m cholesterol, less than 1.5gm of sodium, & 5 or more servings of fruits and vegetables daily;Long Term: Adherence to nutrition and physical activity/exercise program aimed toward attainment of established weight goal;Short Term: Continue to assess and modify interventions until short term weight is achieved;Understanding recommendations for meals to include 15-35% energy as protein, 25-35% energy from fat, 35-60% energy from carbohydrates, less than 2055mof dietary cholesterol, 20-35 gm of total fiber daily;Understanding of distribution of calorie intake throughout the day with the consumption of 4-5 meals/snacks    Improve  shortness of breath with ADL's Yes    Intervention Provide education, individualized exercise plan and daily activity instruction to help decrease symptoms of SOB with activities of daily living.    Expected Outcomes Short  Term: Improve cardiorespiratory fitness to achieve a reduction of symptoms when performing ADLs;Long Term: Be able to perform more ADLs without symptoms or delay the onset of symptoms    Hypertension Yes    Intervention Provide education on lifestyle modifcations including regular physical activity/exercise, weight management, moderate sodium restriction and increased consumption of fresh fruit, vegetables, and low fat dairy, alcohol moderation, and smoking cessation.;Monitor prescription use compliance.    Expected Outcomes Short Term: Continued assessment and intervention until BP is < 140/41m HG in hypertensive participants. < 130/829mHG in hypertensive participants with diabetes, heart failure or chronic kidney disease.;Long Term: Maintenance of blood pressure at goal levels.    Lipids Yes    Intervention Provide education and support for participant on nutrition & aerobic/resistive exercise along with prescribed medications to achieve LDL <7038mHDL >25m74m  Expected Outcomes Short Term: Participant states understanding of desired cholesterol values and is compliant with medications prescribed. Participant is following exercise prescription and nutrition guidelines.;Long Term: Cholesterol controlled with medications as prescribed, with individualized exercise RX and with personalized nutrition plan. Value goals: LDL < 70mg62mL > 40 mg.             Education:Diabetes - Individual verbal and written instruction to review signs/symptoms of diabetes, desired ranges of glucose level fasting, after meals and with exercise. Acknowledge that pre and post exercise glucose checks will be done for 3 sessions at entry of program.   Know Your Numbers and Heart Failure: - Group verbal and visual instruction to discuss disease risk factors for cardiac and pulmonary disease and treatment options.  Reviews associated critical values for Overweight/Obesity, Hypertension, Cholesterol, and Diabetes.   Discusses basics of heart failure: signs/symptoms and treatments.  Introduces Heart Failure Zone chart for action plan for heart failure.  Written material given at graduation.   Core Components/Risk Factors/Patient Goals Review:   Goals and Risk Factor Review     Row Name 10/27/22 1041 11/24/22 1002 12/20/22 1012         Core Components/Risk Factors/Patient Goals Review   Personal Goals Review Weight Management/Obesity;Increase knowledge of respiratory medications and ability to use respiratory devices properly.;Hypertension;Improve shortness of breath with ADL's Weight Management/Obesity;Increase knowledge of respiratory medications and ability to use respiratory devices properly.;Hypertension;Improve shortness of breath with ADL's Improve shortness of breath with ADL's     Review AllenZ[facility] Residesoing well in rehab.  His weight is steady.  Pressures are doing well, they have always been on the lower end of normal. His breathing is doing okay.  It has gotten better since starting.  He is doing well with his meds. AllenZenia Residesoing well in rehab.  He has been working with his doctor to balance out his pressures.  They decreased his metoprolol and it seems to be helping some.  He still feels like it is running low but has not had as many dizzy spells.  He is still having some SOB but it is getting better.  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 His weight is still steady. Spoke to patient about their shortness of breath and what they can do to improve. Patient has been informed of breathing techniques when starting the program. Patient is informed to tell staff if  they have had any med changes and that certain meds they are taking or not taking can be causing shortness of breath.     Expected Outcomes SHort: Conitnue to keep close eye on BP to not get too low Long; Conitnue to montior risk factors Short: Conitnue to monitor BP closely Long; Continue to monitor risk factors. Short:  Attend [facility] regularly to improve shortness of breath with ADL's. Long: maintain independence with ADL's              Core Components/Risk Factors/Patient Goals at Discharge (Final Review):   Goals and Risk Factor Review - 12/20/22 1012       Core Components/Risk Factors/Patient Goals Review   Personal Goals Review Improve shortness of breath with ADL's    Review Spoke to patient about their shortness of breath and what they can do to improve. Patient has been informed of breathing techniques when starting the program. Patient is informed to tell staff if they have had any med changes and that certain meds they are taking or not taking can be causing shortness of breath.    Expected Outcomes Short: Attend [facility] regularly to improve shortness of breath with ADL's. Long: maintain independence with ADL's             ITP Comments:  ITP Comments     Row Name 09/12/22 1111 09/22/22 1105 09/27/22 1041 10/12/22 0818 11/09/22 1031   ITP Comments Virtual Visit completed. Patient informed on EP and RD appointment and 6 Minute walk test. Patient also informed of patient health questionnaires on My Chart. Patient Verbalizes understanding. Visit diagnosis can be found in [facility] 08/26/2022. Completed 6MWT and gym orientation. Initial ITP created and sent for review to Dr. Emily Filbert, Medical Director. First full day of exercise!  Patient was oriented to gym and equipment including functions, settings, policies, and procedures.  Patient's individual exercise prescription and treatment plan were reviewed.  All starting workloads were established based on the results of the 6 minute walk test done at initial orientation visit.  The plan for exercise progression was also introduced and progression will be customized based on patient's performance and goals. 30 Day review completed. Medical Director ITP review done, changes made as directed, and signed approval by Medical Director.   New to program 30  Day review completed. Medical Director ITP review done, changes made as directed, and signed approval by Medical Director.    [place] Name 12/07/22 0952 01/04/23 1419         ITP Comments 30 Day review completed. Medical Director ITP review done, changes made as directed, and signed approval by Medical Director. 30 day review completed. ITP sent to Dr. Zetta Bills, Medical Director of  Pulmonary Rehab. Continue with ITP unless changes are made by physician.               Comments: 30 day review

## 2023-01-05 ENCOUNTER — Encounter: Payer: Medicare HMO | Admitting: *Deleted

## 2023-01-05 DIAGNOSIS — I5022 Chronic systolic (congestive) heart failure: Secondary | ICD-10-CM

## 2023-01-05 NOTE — Progress Notes (Signed)
Daily Session Note  Patient Details  Name: Stuart Harvey MRN: [id number] Date of Birth: October 16, 1951 Referring Provider:   April Harvey Pulmonary Rehab from 09/22/2022 in [facility] Cardiac and Pulmonary Rehab  Referring Provider Stuart Sacramento, MD       Encounter Date: 01/05/2023  Check In:  Session Check In - 01/05/23 1009       Check-In   Supervising physician immediately available to respond to emergencies See telemetry face sheet for immediately available ER MD    Location [facility]-Cardiac & Pulmonary Rehab    Staff Present Stuart Russian, RN, ADN;Stuart Luan Pulling, MA, RCEP, CCRP, Stuart Gala, MS, ACSM CEP, Exercise Physiologist    Virtual Visit No    Medication changes reported     No    Fall or balance concerns reported    No    Warm-up and Cool-down Performed on first and last piece of equipment    Resistance Training Performed Yes    VAD Patient? No    PAD/SET Patient? No      Pain Assessment   Currently in Pain? No/denies                Social History   Tobacco Use  Smoking Status Former   Packs/day: 1.00   Years: 35.00   Total pack years: 35.00   Types: Cigarettes   Quit date: 12/16/2005   Years since quitting: 17.0   Passive exposure: Past  Smokeless Tobacco Current   Types: Chew  Tobacco Comments   pt states he very rarely uses smokeless tobacco     Goals Met:  Independence with exercise equipment Exercise tolerated well No report of concerns or symptoms today Strength training completed today  Goals Unmet:  Not Applicable  Comments: Pt able to follow exercise prescription today without complaint.  Will continue to monitor for progression.    Dr. Emily Harvey is Medical Director for [place].  Dr. Ottie Harvey is Medical Director for [facility] Pulmonary Rehabilitation.

## 2023-01-07 NOTE — Patient Instructions (Signed)
Food Basics for Chronic Kidney Disease Chronic kidney disease (CKD) is when your kidneys are not working well. They cannot remove waste, fluids, and other substances from your blood the way they should. These substances can build up, which can worsen kidney damage and affect how your body works. Eating certain foods can lead to a buildup of these substances. Changing your diet can help prevent more kidney damage. Diet changes may also delay dialysis or even keep you from needing it. What nutrients should I limit? Work with your treatment team and a food expert (dietitian) to make a meal plan that's right for you. Foods you can eat and foods you should limit or avoid will depend on the stage of your kidney disease and any other health conditions you have. The items listed below are not a complete list. Talk with your dietitian to learn what is best for you. Potassium Potassium affects how steadily your heart beats. Too much potassium in your blood can cause an irregular heartbeat or even a heart attack. You may need to limit foods that are high in potassium, such as: Liquid milk and soy milk. Salt substitutes that contain potassium. Fruits like bananas, apricots, nectarines, melon, prunes, raisins, kiwi, and oranges. Vegetables, such as potatoes, sweet potatoes, yams, tomatoes, leafy greens, beets, avocado, pumpkin, and winter squash. Beans, like lima beans. Nuts. Phosphorus Phosphorus is a mineral found in your bones. You need a balance between calcium and phosphorus to build and maintain healthy bones. Too much added phosphorus from the foods you eat can pull calcium from your bones. Losing calcium can make your bones weak and more likely to break. Too much phosphorus can also make your skin itch. You may need to limit foods that are high in phosphorus or that have added phosphorus, such as: Liquid milk and dairy products. Dark-colored sodas or soft drinks. Bran cereals and  oatmeal. Protein  Protein helps you make and keep muscle. Protein also helps to repair your body's cells and tissues. One of the natural breakdown products of protein is a waste product called urea. When your kidneys are not working well, they cannot remove types of waste like urea. Reducing protein in your diet can help keep urea from building up in your blood. Depending on your stage of kidney disease, you may need to eat smaller portions of foods that are high in protein. Sources of animal protein include: Meat (all types). Fish and seafood. Poultry. Eggs. Dairy. Other protein foods include: Beans and legumes. Nuts and nut butter. Soy, like tofu.  Sodium Salt (sodium) helps to keep a healthy balance of fluids in your body. Too much salt can increase your blood pressure, which can harm your heart and lungs. Extra salt can also cause your body to keep too much fluid, making your kidneys work harder. You may need to limit or avoid foods that are high in salt, such as: Salt seasonings. Soy and teriyaki sauce. Packaged, precooked, cured, or processed meats, such as sausages or meat loaves. Sardines. Salted crackers and snack foods. Fast food. Canned soups and most canned foods. Pickled foods. Vegetable juice. Boxed mixes or ready-to-eat boxed meals and side dishes. Bottled dressings, sauces, and marinades. Talk with your dietitian about how much potassium, phosphorus, protein, and salt you may have each day. Helpful tips Read food labels  Check the amount of salt in foods. Limit foods that have salt or sodium listed among the first five ingredients. Try to eat low-salt foods. Check the ingredient list   for added phosphorus or potassium. "Phos" in an ingredient is a sign that phosphorus has been added. Do not buy foods that are calcium-enriched or that have calcium added to them (are fortified). Buy canned vegetables and beans that say "no salt added" and rinse them before  eating. Lifestyle Limit the amount of protein you eat from animal sources each day. Focus on protein from plant sources, like tofu and dried beans, peas, and lentils. Do not add salt to food when cooking or before eating. Do not eat star fruit. It can be toxic for people with kidney problems. Talk with your health care provider before taking any vitamin or mineral supplements. If told by your health care provider, track how much liquid you drink so you can avoid drinking too much. You may need to include foods you eat that are made mostly from water, like gelatin, ice cream, soups, and juicy fruits and vegetables. If you have diabetes: If you have diabetes (diabetes mellitus) and CKD, you need to keep your blood sugar (glucose) in the target range recommended by your health care provider. Follow your diabetes management plan. This may include: Checking your blood glucose regularly. Taking medicines by mouth, or taking insulin, or both. Exercising for at least 30 minutes on 5 or more days each week, or as told by your health care provider. Tracking how many servings of carbohydrates you eat at each meal. Not using orange juice to treat low blood sugars. Instead, use apple juice, cranberry juice, or clear soda. You may be given guidelines on what foods and nutrients you may eat, and how much you can have each day. This depends on your stage of kidney disease and whether you have high blood pressure (hypertension). Follow the meal plan your dietitian gives you. To learn more: National Institute of Diabetes and Digestive and Kidney Diseases: niddk.nih.gov National Kidney Foundation: kidney.org Summary Chronic kidney disease (CKD) is when your kidneys are not working well. They cannot remove waste, fluids, and other substances from your blood the way they should. These substances can build up, which can worsen kidney damage and affect how your body works. Changing your diet can help prevent more  kidney damage. Diet changes may also delay dialysis or even keep you from needing it. Diet changes are different for each person with CKD. Work with a dietitian to set up a meal plan that is right for you. This information is not intended to replace advice given to you by your health care provider. Make sure you discuss any questions you have with your health care provider. Document Revised: 02/18/2022 Document Reviewed: 02/24/2020 Elsevier Patient Education  2023 Elsevier Inc.  

## 2023-01-10 ENCOUNTER — Encounter: Payer: Medicare HMO | Admitting: *Deleted

## 2023-01-10 DIAGNOSIS — I5022 Chronic systolic (congestive) heart failure: Secondary | ICD-10-CM | POA: Diagnosis not present

## 2023-01-10 NOTE — Progress Notes (Signed)
Daily Session Note  Patient Details  Name: Stuart Harvey MRN: [id number] Date of Birth: [birth year] Referring Provider:   [name] Pulmonary Rehab from 09/22/2022 in [facility] Cardiac and Pulmonary Rehab  Referring Provider [name], MD       Encounter Date: 01/10/2023  Check In:  Session Check In - 01/10/23 0946       Check-In   Supervising physician immediately available to respond to emergencies See telemetry face sheet for immediately available ER MD    Location [facility]-Cardiac & Pulmonary Rehab    Staff Present Renita Papa, RN BSN;Jessica Luan Pulling, MA, RCEP, CCRP, Bertram Gala, MS, ACSM CEP, Exercise Physiologist    Virtual Visit No    Medication changes reported     No    Fall or balance concerns reported    No    Warm-up and Cool-down Performed on first and last piece of equipment    Resistance Training Performed Yes    VAD Patient? No    PAD/SET Patient? No      Pain Assessment   Currently in Pain? No/denies                Social History   Tobacco Use  Smoking Status Former   Packs/day: 1.00   Years: 35.00   Total pack years: 35.00   Types: Cigarettes   Quit date: 12/16/2005   Years since quitting: 17.0   Passive exposure: Past  Smokeless Tobacco Current   Types: Chew  Tobacco Comments   pt states he very rarely uses smokeless tobacco     Goals Met:  Independence with exercise equipment Exercise tolerated well No report of concerns or symptoms today Strength training completed today  Goals Unmet:  Not Applicable  Comments: Pt able to follow exercise prescription today without complaint.  Will continue to monitor for progression.    Dr. Emily Filbert is Medical Director for [place].  Dr. Ottie Glazier is Medical Director for [facility] Pulmonary Rehabilitation.

## 2023-01-11 ENCOUNTER — Ambulatory Visit (INDEPENDENT_AMBULATORY_CARE_PROVIDER_SITE_OTHER): Payer: Medicare HMO | Admitting: Nurse Practitioner

## 2023-01-11 ENCOUNTER — Encounter: Payer: Self-pay | Admitting: Nurse Practitioner

## 2023-01-11 VITALS — BP 111/63 | HR 69 | Temp 97.8°F | Ht 70.98 in | Wt 212.9 lb

## 2023-01-11 DIAGNOSIS — I480 Paroxysmal atrial fibrillation: Secondary | ICD-10-CM | POA: Diagnosis not present

## 2023-01-11 DIAGNOSIS — N183 Chronic kidney disease, stage 3 unspecified: Secondary | ICD-10-CM

## 2023-01-11 DIAGNOSIS — E782 Mixed hyperlipidemia: Secondary | ICD-10-CM

## 2023-01-11 DIAGNOSIS — E039 Hypothyroidism, unspecified: Secondary | ICD-10-CM

## 2023-01-11 DIAGNOSIS — N1831 Chronic kidney disease, stage 3a: Secondary | ICD-10-CM | POA: Diagnosis not present

## 2023-01-11 DIAGNOSIS — I7143 Infrarenal abdominal aortic aneurysm, without rupture: Secondary | ICD-10-CM

## 2023-01-11 DIAGNOSIS — I25118 Atherosclerotic heart disease of native coronary artery with other forms of angina pectoris: Secondary | ICD-10-CM

## 2023-01-11 DIAGNOSIS — F324 Major depressive disorder, single episode, in partial remission: Secondary | ICD-10-CM

## 2023-01-11 DIAGNOSIS — R351 Nocturia: Secondary | ICD-10-CM

## 2023-01-11 DIAGNOSIS — I5020 Unspecified systolic (congestive) heart failure: Secondary | ICD-10-CM | POA: Diagnosis not present

## 2023-01-11 DIAGNOSIS — I129 Hypertensive chronic kidney disease with stage 1 through stage 4 chronic kidney disease, or unspecified chronic kidney disease: Secondary | ICD-10-CM | POA: Diagnosis not present

## 2023-01-11 DIAGNOSIS — I723 Aneurysm of iliac artery: Secondary | ICD-10-CM

## 2023-01-11 DIAGNOSIS — R7301 Impaired fasting glucose: Secondary | ICD-10-CM

## 2023-01-11 DIAGNOSIS — N401 Enlarged prostate with lower urinary tract symptoms: Secondary | ICD-10-CM

## 2023-01-11 DIAGNOSIS — D692 Other nonthrombocytopenic purpura: Secondary | ICD-10-CM

## 2023-01-11 NOTE — Assessment & Plan Note (Addendum)
Chronic, ongoing.  Rate controlled.  Continue  collaboration with cardiology + Eliquis, recent note reviewed + labs.  CBC next visit, annually.  CCM assist in place with Entresto, but not Eliquis.

## 2023-01-11 NOTE — Assessment & Plan Note (Signed)
Chronic, stable.  Continue Eliquis and statin.  Continue to collaborate with cardiology.

## 2023-01-11 NOTE — Assessment & Plan Note (Addendum)
Chronic, stable.  BP at goal for age today -- monitor closely as is on Toprol XL and Wellbutrin.  Recommend he monitor BP at least a few mornings a week at home and document.  DASH diet at home.  Continue current medication regimen and adjust as needed.  Labs today: lipid panel, remainder up to date with nephrology.

## 2023-01-11 NOTE — Assessment & Plan Note (Signed)
Bilateral upper extremity, takes Eliquis daily.   Continue to monitor for skin breakdown and encourage use of lotion daily to bilateral upper extremities.

## 2023-01-11 NOTE — Assessment & Plan Note (Signed)
Followed by cardiology and vascular.  History of stent in 2014.  Continue to collaborate with team members.

## 2023-01-11 NOTE — Assessment & Plan Note (Signed)
Chronic, ongoing.  Continue current medication regimen and adjust as needed.  TSH and Free T4 today.

## 2023-01-11 NOTE — Assessment & Plan Note (Addendum)
Chronic, stable.  Continue Celexa 20 MG due to age >31 and risks with 40 MG dosing and continue Wellbutrin.  Denies SI/HI.  Monitor Wellbutrin and Toprol XL use + Celexa and Omeprazole -- discussed may need to consider mood medication adjustments in future.

## 2023-01-11 NOTE — Assessment & Plan Note (Signed)
Chronic, ongoing.  Euvolemic on exam today. Continue collaboration with CCM on Entresto and collaboration with cardiology + current medication regimen.  Recommend: - Reminded to call for an overnight weight gain of >2 pounds or a weekly weight gain of >5 pounds - not adding salt to food and read food labels. Reviewed the importance of keeping daily sodium intake to '2000mg'$  daily. - Avoid NSAIDS

## 2023-01-11 NOTE — Progress Notes (Signed)
BP 111/63   Pulse 69   Temp 97.8 F (36.6 C) (Oral)   Ht 5' 10.98" (1.803 m)   Wt 212 lb 14.4 oz (96.6 kg)   SpO2 98%   BMI 29.71 kg/m    Subjective:    Patient ID: Stuart Harvey, male    DOB: August 27, 1951, 72 y.o.   MRN: [id number]  HPI: Stuart Harvey is a 72 y.o. male  Chief Complaint  Patient presents with   Hypertension   Hyperlipidemia   Coronary Artery Disease   Depression   Congestive Heart Failure   Chronic Kidney Disease   Hypothyroidism   Benign Prostatic Hypertrophy   IFG   AAA   lliac aneurysm   HYPERTENSION / HYPERLIPIDEMIA/CKD 3 Last saw cardiology on 11/16/22, they have cut back on Metoprolol in past due to hypotension -- currently taking XL 12.5 MG daily.  Last echo on 07/20/2022 -- with EF 35-40% with LV moderately decreased function -- change and decline in function.  Currently doing pulmonary/heart track rehab, last visit 01/05/23.  Last saw vascular 12/07/21 for his AAA, had stent in 2014.    Meds: Entresto, Metoprolol XL, Rosuvastatin, Zetia Satisfied with current treatment? yes Duration of hypertension: chronic BP monitoring frequency: not checking BP range:  BP medication side effects: no Duration of hyperlipidemia: chronic Cholesterol medication side effects: no Cholesterol supplements: none Medication compliance: good compliance Aspirin: no Recent stressors: no Recurrent headaches: no Visual changes: no Palpitations: no Dyspnea: occasional with heart trac program Chest pain: no Lower extremity edema: no Dizzy/lightheaded: no  The 10-year ASCVD risk score (Arnett DK, et al., 2019) is: 18.8%   Values used to calculate the score:     Age: 80 years     Sex: Male     Is Non-Hispanic African American: No     Diabetic: No     Tobacco smoker: Yes     Systolic Blood Pressure: 99991111 mmHg     Is BP treated: Yes     HDL Cholesterol: 46 mg/dL     Total Cholesterol: 146 mg/dL  ATRIAL FIBRILLATION Taking Eliquis.  Has talked to [facility] team about  assistance, he is getting assistance for Entresto, but not Eliquis. Atrial fibrillation status: stable Satisfied with current treatment: yes  Medication side effects:  no Medication compliance: good compliance Etiology of atrial fibrillation:  Palpitations:  no Chest pain:  no Dyspnea on exertion:  no Orthopnea:  no Syncope:  no Edema:  no Ventricular rate control: BB Anti-coagulation: long acting  IFG: Last A1c in February 2022 was 5.7%. Polydipsia/polyuria: no Visual disturbance: no Chest pain: no Paresthesias: no   HYPOTHYROIDISM Continues on Levothyroxine 75 MCG. Thyroid control status:stable Satisfied with current treatment? yes Medication side effects: no Medication compliance: good compliance Etiology of hypothyroidism:  Recent dose adjustment:no Fatigue: no Cold intolerance: no Heat intolerance: no Weight gain: no Weight loss: no Constipation: no Diarrhea/loose stools: no Palpitations: no Lower extremity edema: no Anxiety/depressed mood: no   CHRONIC KIDNEY DISEASE Saw nephrology last 12/13/22 -- eGFR 53, CRT 1.41. CKD status: stable Medications renally dose: yes Previous renal evaluation: no Pneumovax:  Up to Date Influenza Vaccine:  Up to Date   BPH Last saw urology last 11/21/2021. BPH status: uncontrolled Duration: chronic Nocturia: 2-3x per night Urinary frequency:yes Incomplete voiding: no Urgency: yes Weak urinary stream: no Straining to start stream: no Dysuria: no Onset: gradual Severity: moderate Alleviating factors:  Aggravating factors:  Treatments attempted:  IPSS Questionnaire (AUA-7): Over the past  month.   1)  How often have you had a sensation of not emptying your bladder completely after you finish urinating?  1 - Less than 1 time in 5  2)  How often have you had to urinate again less than two hours after you finished urinating? 1 - Less than 1 time in 5  3)  How often have you found you stopped and started again several  times when you urinated?  1 - Less than 1 time in 5  4) How difficult have you found it to postpone urination?  0 - Not at all  5) How often have you had a weak urinary stream?  0 - Not at all  6) How often have you had to push or strain to begin urination?  0 - Not at all  7) How many times did you most typically get up to urinate from the time you went to bed until the time you got up in the morning?  3 - 3 times  Total score:  0-7 mildly symptomatic   8-19 moderately symptomatic   20-35 severely symptomatic    DEPRESSION Continues on Wellbutrin 200 MG BID and Celexa 20 MG daily.   Mood status: stable Satisfied with current treatment?: no Symptom severity: mild  Duration of current treatment : chronic Side effects: no Medication compliance: good compliance Psychotherapy/counseling: none Depressed mood: no Anxious mood: no Anhedonia: no Significant weight loss or gain: no Insomnia: occasionally hard to fall asleep Fatigue: no Feelings of worthlessness or guilt: no Impaired concentration/indecisiveness: no Suicidal ideations: no Hopelessness: no Crying spells: no    01/11/2023   10:36 AM 10/11/2022   10:46 AM 09/22/2022    1:38 PM 08/09/2022   11:01 AM 09/29/2021    9:10 AM  Depression screen PHQ 2/9  Decreased Interest 0 1 0 0 0  Down, Depressed, Hopeless 0 0 0 0 0  PHQ - 2 Score 0 1 0 0 0  Altered sleeping '1 1 1 '$ 0 0  Tired, decreased energy '1 1 1 1 1  '$ Change in appetite 0 0 0 1 0  Feeling bad or failure about yourself  0 0 0 0 0  Trouble concentrating 0 1 1 0 0  Moving slowly or fidgety/restless 0 0 0 0 0  Suicidal thoughts 0 0 0 0 0  PHQ-9 Score '2 4 3 2 1  '$ Difficult doing work/chores Not difficult at all Not difficult at all Somewhat difficult Not difficult at all Not difficult at all   Relevant past medical, surgical, family and social history reviewed and updated as indicated. Interim medical history since our last visit reviewed. Allergies and medications  reviewed and updated.  Review of Systems  Constitutional:  Negative for activity change, diaphoresis, fatigue and fever.  Respiratory:  Negative for cough, chest tightness, shortness of breath and wheezing.   Cardiovascular:  Negative for chest pain, palpitations and leg swelling.  Gastrointestinal: Negative.   Endocrine: Negative for cold intolerance, heat intolerance, polydipsia, polyphagia and polyuria.  Neurological: Negative.   Psychiatric/Behavioral: Negative.      Per HPI unless specifically indicated above     Objective:    BP 111/63   Pulse 69   Temp 97.8 F (36.6 C) (Oral)   Ht 5' 10.98" (1.803 m)   Wt 212 lb 14.4 oz (96.6 kg)   SpO2 98%   BMI 29.71 kg/m   Wt Readings from Last 3 Encounters:  01/11/23 212 lb 14.4 oz (96.6 kg)  11/16/22  216 lb 6 oz (98.1 kg)  10/14/22 217 lb 4 oz (98.5 kg)    Physical Exam Vitals and nursing note reviewed.  Constitutional:      General: He is awake. He is not in acute distress.    Appearance: He is well-developed. He is not ill-appearing.  HENT:     Head: Normocephalic and atraumatic.     Right Ear: Hearing normal. No drainage.     Left Ear: Hearing normal. No drainage.     Mouth/Throat:     Pharynx: Uvula midline.  Eyes:     General: Lids are normal.        Right eye: No discharge.        Left eye: No discharge.     Conjunctiva/sclera: Conjunctivae normal.     Pupils: Pupils are equal, round, and reactive to light.  Neck:     Thyroid: No thyromegaly.     Vascular: No carotid bruit or JVD.     Trachea: Trachea normal.  Cardiovascular:     Rate and Rhythm: Normal rate and regular rhythm.     Heart sounds: Normal heart sounds, S1 normal and S2 normal. No murmur heard.    No gallop.  Pulmonary:     Effort: Pulmonary effort is normal.     Breath sounds: Normal breath sounds.  Abdominal:     General: Bowel sounds are normal.     Palpations: Abdomen is soft. There is no hepatomegaly or splenomegaly.     Tenderness:  There is no abdominal tenderness.  Musculoskeletal:        General: Normal range of motion.     Cervical back: Normal range of motion and neck supple.     Right lower leg: No edema.     Left lower leg: No edema.  Skin:    General: Skin is warm and dry.     Capillary Refill: Capillary refill takes less than 2 seconds.     Findings: No rash.     Comments: Scattered areas of pale purple bruises to bilateral upper extremities.  Intact skin.  Neurological:     Mental Status: He is alert and oriented to person, place, and time.     Deep Tendon Reflexes: Reflexes are normal and symmetric.  Psychiatric:        Mood and Affect: Mood normal.        Behavior: Behavior normal. Behavior is cooperative.        Thought Content: Thought content normal.        Judgment: Judgment normal.     Results for orders placed or performed during the hospital encounter of [id number]  Basic metabolic panel  Result Value Ref Range   Sodium 138 135 - 145 mmol/L   Potassium 5.0 3.5 - 5.1 mmol/L   Chloride 111 98 - 111 mmol/L   CO2 21 (L) 22 - 32 mmol/L   Glucose, Bld 113 (H) 70 - 99 mg/dL   BUN 29 (H) 8 - 23 mg/dL   Creatinine, Ser 1.37 (H) 0.61 - 1.24 mg/dL   Calcium 9.4 8.9 - 10.3 mg/dL   GFR, Estimated 55 (L) >60 mL/min   Anion gap 6 5 - 15      Assessment & Plan:   Problem List Items Addressed This Visit       Cardiovascular and Mediastinum   AAA (abdominal aortic aneurysm) ([place])    Followed by cardiology and vascular.  History of stent in 2014.  Continue to collaborate with team  members.      Atrial fibrillation (HCC)    Chronic, ongoing.  Rate controlled.  Continue  collaboration with cardiology + Eliquis, recent note reviewed + labs.  CBC next visit, annually.  CCM assist in place with Entresto, but not Eliquis.        Benign hypertension with CKD (chronic kidney disease) stage III (HCC) - Primary    Chronic, stable.  BP at goal for age today -- monitor closely as is on Toprol XL and  Wellbutrin.  Recommend he monitor BP at least a few mornings a week at home and document.  DASH diet at home.  Continue current medication regimen and adjust as needed.  Labs today: lipid panel, remainder up to date with nephrology.         Coronary artery disease    Chronic, stable.  Continue Eliquis and statin.  Continue to collaborate with cardiology.       Heart failure with reduced ejection fraction (HCC)    Chronic, ongoing.  Euvolemic on exam today. Continue collaboration with CCM on Entresto and collaboration with cardiology + current medication regimen.  Recommend: - Reminded to call for an overnight weight gain of >2 pounds or a weekly weight gain of >5 pounds - not adding salt to food and read food labels. Reviewed the importance of keeping daily sodium intake to '2000mg'$  daily. - Avoid NSAIDS      Senile purpura ([place])    Bilateral upper extremity, takes Eliquis daily.   Continue to monitor for skin breakdown and encourage use of lotion daily to bilateral upper extremities.        Endocrine   Hypothyroid    Chronic, ongoing.  Continue current medication regimen and adjust as needed.  TSH and Free T4 today.      Relevant Orders   TSH   T4, free   IFG (impaired fasting glucose)    Recheck A1c today, continue diet focus.      Relevant Orders   HgB A1c     Genitourinary   BPH (benign prostatic hyperplasia)    Followed by urology, no current medications.  Working on diet changes, including reduction of coffee which he drinks all day.  Continue to collaborate with urology.      CKD (chronic kidney disease) stage 3, GFR 30-59 ml/min (HCC)    Chronic, ongoing. Followed by nephrology.  Continue current medication regimen and adjust as needed.  Labs up to date with nephrology.        Other   Depression    Chronic, stable.  Continue Celexa 20 MG due to age >41 and risks with 40 MG dosing and continue Wellbutrin.  Denies SI/HI.  Monitor Wellbutrin and Toprol XL use +  Celexa and Omeprazole -- discussed may need to consider mood medication adjustments in future.       Hyperlipidemia    Chronic, stable.  Continue current medication regimen and adjust as needed.  Lipid panel today.        Relevant Orders   Lipid Panel w/o Chol/HDL Ratio     Follow up plan: Return in about 6 months (around 07/12/2023) for HTN/HLD/HF, A-fib, AAA, CKD, THYROID, MOOD.

## 2023-01-11 NOTE — Assessment & Plan Note (Signed)
Followed by urology, no current medications.  Working on diet changes, including reduction of coffee which he drinks all day.  Continue to collaborate with urology.

## 2023-01-11 NOTE — Assessment & Plan Note (Signed)
Recheck A1c today, continue diet focus.

## 2023-01-11 NOTE — Assessment & Plan Note (Signed)
Chronic, stable.  Continue current medication regimen and adjust as needed.  Lipid panel today.

## 2023-01-11 NOTE — Assessment & Plan Note (Signed)
Chronic, ongoing. Followed by nephrology.  Continue current medication regimen and adjust as needed.  Labs up to date with nephrology.

## 2023-01-12 ENCOUNTER — Encounter: Payer: Medicare HMO | Admitting: *Deleted

## 2023-01-12 DIAGNOSIS — I5022 Chronic systolic (congestive) heart failure: Secondary | ICD-10-CM

## 2023-01-12 LAB — LIPID PANEL W/O CHOL/HDL RATIO
Cholesterol, Total: 146 mg/dL (ref 100–199)
HDL: 52 mg/dL (ref >39)
LDL Chol Calc (NIH): 76 mg/dL (ref 0–99)
Triglycerides: 99 mg/dL (ref 0–149)
VLDL Cholesterol Cal: 18 mg/dL (ref 5–40)

## 2023-01-12 LAB — HEMOGLOBIN A1C
Est. average glucose Bld gHb Est-mCnc: 128 mg/dL
Hgb A1c MFr Bld: 6.1 % — ABNORMAL HIGH (ref 4.8–5.6)

## 2023-01-12 LAB — T4, FREE: Free T4: 1.19 ng/dL (ref 0.82–1.77)

## 2023-01-12 LAB — TSH: TSH: 3.84 u[IU]/mL (ref 0.450–4.500)

## 2023-01-12 NOTE — Progress Notes (Signed)
Contacted via MyChart   Good evening Allen, your labs have returned: - Thyroid levels stable, continue Levothyroxine at current dosing. - Cholesterol levels stable, continue current Zetia and Rosuvastatin. - The A1C is the diabetes testing we talked about, this looks at your blood sugars over the past 3 months and turns the average into a number.  Your number is 6.1%, meaning you are still prediabetic.  Any number 5.7 to 6.4 is considered prediabetes and any number 6.5 or greater is considered diabetes.   I would recommend heavy focus on decreasing foods high in sugar and your intake of things like bread products, pasta, and rice.  The American Diabetes Association online has a large amount of information on diet changes to make.  We will recheck this number in 6 months to ensure you are not continuing to trend upwards and move into diabetes.  Any questions? Keep being amazing!!  Thank you for allowing me to participate in your care.  I appreciate you. Kindest regards, Pleas Carneal

## 2023-01-12 NOTE — Progress Notes (Signed)
Daily Session Note  Patient Details  Name: Stuart Harvey MRN: [id number] Date of Birth: Jul 20, 1951 Referring Provider:   [name] Pulmonary Rehab from 09/22/2022 in [facility] Cardiac and Pulmonary Rehab  Referring Provider [name], MD       Encounter Date: 01/12/2023  Check In:  Session Check In - 01/12/23 1007       Check-In   Supervising physician immediately available to respond to emergencies See telemetry face sheet for immediately available ER MD    Location [facility]-Cardiac & Pulmonary Rehab    Staff Present Larna Daughters, MS, ACSM CEP, Exercise Physiologist;Jessica Luan Pulling, MA, RCEP, CCRP, CCET;Laureen Owens Shark, BS, RRT, CPFT;Megan Tamala Julian, RN, [place]   Darel Hong RN BSN   Virtual Visit No    Medication changes reported     No    Fall or balance concerns reported    No    Warm-up and Cool-down Performed on first and last piece of equipment    Resistance Training Performed Yes    VAD Patient? No    PAD/SET Patient? No      Pain Assessment   Currently in Pain? No/denies                Social History   Tobacco Use  Smoking Status Former   Packs/day: 1.00   Years: 35.00   Total pack years: 35.00   Types: Cigarettes   Quit date: 12/16/2005   Years since quitting: 17.0   Passive exposure: Past  Smokeless Tobacco Current   Types: Chew  Tobacco Comments   pt states he very rarely uses smokeless tobacco     Goals Met:  Independence with exercise equipment Exercise tolerated well Personal goals reviewed Strength training completed today  Goals Unmet:  Not Applicable  Comments: Pt able to follow exercise prescription today without complaint.  Will continue to monitor for progression.    Dr. Emily Filbert is Medical Director for [place].  Dr. Ottie Glazier is Medical Director for [facility] Pulmonary Rehabilitation.

## 2023-01-18 ENCOUNTER — Ambulatory Visit: Payer: Medicare HMO | Attending: Physician Assistant

## 2023-01-18 DIAGNOSIS — I2510 Atherosclerotic heart disease of native coronary artery without angina pectoris: Secondary | ICD-10-CM

## 2023-01-18 DIAGNOSIS — I5020 Unspecified systolic (congestive) heart failure: Secondary | ICD-10-CM | POA: Diagnosis not present

## 2023-01-18 LAB — ECHOCARDIOGRAM COMPLETE
AR max vel: 4 cm2
AV Area VTI: 3.92 cm2
AV Area mean vel: 3.92 cm2
AV Mean grad: 2 mmHg
AV Peak grad: 4.8 mmHg
Ao pk vel: 1.1 m/s
Area-P 1/2: 1.81 cm2
S' Lateral: 4.2 cm
Single Plane A4C EF: 40.4 %

## 2023-01-31 ENCOUNTER — Encounter: Payer: Medicare HMO | Attending: Cardiovascular Disease | Admitting: *Deleted

## 2023-01-31 DIAGNOSIS — I5022 Chronic systolic (congestive) heart failure: Secondary | ICD-10-CM | POA: Insufficient documentation

## 2023-01-31 NOTE — Progress Notes (Signed)
Daily Session Note  Patient Details  Name: Stuart Harvey MRN: [id number] Date of Birth: Oct 31, 1951 Referring Provider:   [name] Pulmonary Rehab from 09/22/2022 in [facility] Cardiac and Pulmonary Rehab  Referring Provider [name], MD       Encounter Date: 01/31/2023  Check In:  Session Check In - 01/31/23 1048       Check-In   Supervising physician immediately available to respond to emergencies See telemetry face sheet for immediately available ER MD    Location [facility]-Cardiac & Pulmonary Rehab    Staff Present Heath Lark, RN, BSN, CCRP;Jessica [place], MA, RCEP, CCRP, Bertram Gala, MS, ACSM CEP, Exercise Physiologist    Virtual Visit No    Medication changes reported     No    Fall or balance concerns reported    No    Warm-up and Cool-down Performed on first and last piece of equipment    Resistance Training Performed Yes    PAD/SET Patient? No      Pain Assessment   Currently in Pain? No/denies                Social History   Tobacco Use  Smoking Status Former   Packs/day: 1.00   Years: 35.00   Additional pack years: 0.00   Total pack years: 35.00   Types: Cigarettes   Quit date: 12/16/2005   Years since quitting: 17.1   Passive exposure: Past  Smokeless Tobacco Current   Types: Chew  Tobacco Comments   pt states he very rarely uses smokeless tobacco     Goals Met:  Independence with exercise equipment Exercise tolerated well No report of concerns or symptoms today  Goals Unmet:  Not Applicable  Comments: Pt able to follow exercise prescription today without complaint.  Will continue to monitor for progression.    Dr. Emily Filbert is Medical Director for [place].  Dr. Ottie Glazier is Medical Director for [facility] Pulmonary Rehabilitation.

## 2023-02-01 ENCOUNTER — Encounter: Payer: Self-pay | Admitting: *Deleted

## 2023-02-01 DIAGNOSIS — I5022 Chronic systolic (congestive) heart failure: Secondary | ICD-10-CM

## 2023-02-01 NOTE — Progress Notes (Signed)
Pulmonary Individual Treatment Plan  Patient Details  Name: Stuart Harvey MRN: [id number] Date of Birth: 01/13/1951 Referring Provider:   Flowsheet Row Pulmonary Rehab from 09/22/2022 in [facility] Cardiac and Pulmonary Rehab  Referring Provider [name], MD       Initial Encounter Date:  Flowsheet Row Pulmonary Rehab from 09/22/2022 in [facility] Cardiac and Pulmonary Rehab  Date 09/22/22       Visit Diagnosis: Heart failure, chronic systolic ([place])  Patient's Home Medications on Admission:  Current Outpatient Medications:    apixaban (ELIQUIS) 5 MG TABS tablet, Take 1 tablet (5 mg total) by mouth 2 (two) times daily., Disp: 180 tablet, Rfl: 3   buPROPion (WELLBUTRIN SR) 200 MG 12 hr tablet, Take 1 tablet (200 mg total) by mouth 2 (two) times daily., Disp: 180 tablet, Rfl: 4   Cholecalciferol 50 MCG (2000 UT) CAPS, Take 1 capsule by mouth daily. , Disp: , Rfl:    citalopram (CELEXA) 40 MG tablet, Take 1 tablet (40 mg total) by mouth daily., Disp: 90 tablet, Rfl: 0   ezetimibe (ZETIA) 10 MG tablet, Take 1 tablet (10 mg total) by mouth daily., Disp: 90 tablet, Rfl: 1   levothyroxine (SYNTHROID) 75 MCG tablet, Take 1 tablet (75 mcg total) by mouth daily., Disp: 90 tablet, Rfl: 0   metoprolol succinate (TOPROL XL) 25 MG 24 hr tablet, Take 0.5 tablets (12.5 mg total) by mouth daily., Disp: 90 tablet, Rfl: 3   omeprazole (PRILOSEC) 20 MG capsule, Take 1 capsule (20 mg total) by mouth daily., Disp: 90 capsule, Rfl: 0   rosuvastatin (CRESTOR) 40 MG tablet, Take 1 tablet (40 mg total) by mouth daily., Disp: 90 tablet, Rfl: 2   sacubitril-valsartan (ENTRESTO) 24-26 MG, Take 1 tablet by mouth 2 (two) times daily., Disp: 60 tablet, Rfl: 11  Past Medical History: Past Medical History:  Diagnosis Date   AAA (abdominal aortic aneurysm) ([place])    a.) s/p EVAR in 08/2007 by Dr. Hulda Humphrey. b.) CTA on 06/30/2021 --> interval aneurysmal dilitation superior to stent graft with the juxtarenal aorta measuring  3.8 cm.   Anemia    Arthritis    both knees   Chronic anticoagulation    Apixaban   CKD (chronic kidney disease), stage II    Coronary artery disease    a.) 4v CABG @ [facility] 06/2007 (LIMA-LAD, VG-RPDA, VG-OM3,VG-D2); b.) LHC 2011: patent grafts EF 40%, c.) LHC 06/05/18: LM nl, pLAD 50%, p-mLAD 50%, ostD1 70%, ost ramus 30%, ost-pLCx 40%, pLCx 100% (chronically occluded), OM3 100%, p-dRCA 100% (chronically occluded), LIMA-LAD patent, VG-RPDA patent, VG-OM3 mid-graft 60% and 80%   Depression    GERD (gastroesophageal reflux disease)    Hernia of abdominal cavity    History of 2019 novel coronavirus disease (COVID-19) 09/2019   Hyperlipidemia    Hypertension    Hypothyroidism    Iliac artery aneurysm, right ([place])    a.) at the distal landing zone of the right iliac limb measuring 3 cm   Right middle lobe pulmonary nodule 06/30/2021   a.) measured 8 mm by CT on 06/30/2021.   S/P CABG x 4 06/25/2007   a.) LIMA-LAD, SVG-RPDA, SVG-OM3, SVG-D2   Sigmoid diverticulosis    Ventricular tachycardia ([place]) 05/2018   a.) found unresponsive by EMS; VT noted --> defibrillation achieved ROSC. Briefly intubated. Episode felt to be secondary to heat stoke.    Tobacco Use: Social History   Tobacco Use  Smoking Status Former   Packs/day: 1.00   Years: 35.00  Additional pack years: 0.00   Total pack years: 35.00   Types: Cigarettes   Quit date: 12/16/2005   Years since quitting: 17.1   Passive exposure: Past  Smokeless Tobacco Current   Types: Chew  Tobacco Comments   pt states he very rarely uses smokeless tobacco     Labs: Review Flowsheet  More data exists      Latest Ref Rng & Units 02/18/2021 09/22/2021 03/29/2022 10/11/2022 01/11/2023  Labs for ITP Cardiac and Pulmonary Rehab  Cholestrol 100 - 199 mg/dL 127  145  142  146  146   LDL (calc) 0 - 99 mg/dL 58  77  74  80  76   HDL-C >39 mg/dL 45  47  49  46  52   Trlycerides 0 - 149 mg/dL 138  114  105  112  99   Hemoglobin A1c 4.8 - 5.6 %  - - - - 6.1      Pulmonary Assessment Scores:  Pulmonary Assessment Scores     Row Name 09/22/22 1338 09/27/22 1131       ADL UCSD   ADL Phase Entry Entry    SOB Score total -- 37    Rest -- 1    Walk -- 1    Stairs -- 2    Bath -- 1    Dress -- 1    Shop -- 2      CAT Score   CAT Score -- 13      mMRC Score   mMRC Score 1 --             UCSD: Self-administered rating of dyspnea associated with activities of daily living (ADLs) 6-point scale (0 = "not at all" to 5 = "maximal or unable to do because of breathlessness")  Scoring Scores range from 0 to 120.  Minimally important difference is 5 units  CAT: CAT can identify the health impairment of COPD patients and is better correlated with disease progression.  CAT has a scoring range of zero to 40. The CAT score is classified into four groups of low (less than 10), medium (10 - 20), high (21-30) and very high (31-40) based on the impact level of disease on health status. A CAT score over 10 suggests significant symptoms.  A worsening CAT score could be explained by an exacerbation, poor medication adherence, poor inhaler technique, or progression of COPD or comorbid conditions.  CAT MCID is 2 points  mMRC: mMRC (Modified Medical Research Council) Dyspnea Scale is used to assess the degree of baseline functional disability in patients of respiratory disease due to dyspnea. No minimal important difference is established. A decrease in score of 1 point or greater is considered a positive change.   Pulmonary Function Assessment:  Pulmonary Function Assessment - 09/12/22 1113       Breath   Shortness of Breath Yes;Limiting activity             Exercise Target Goals: Exercise Program Goal: Individual exercise prescription set using results from initial 6 min walk test and THRR while considering  patient's activity barriers and safety.   Exercise Prescription Goal: Initial exercise prescription builds to 30-45  minutes a day of aerobic activity, 2-3 days per week.  Home exercise guidelines will be given to patient during program as part of exercise prescription that the participant will acknowledge.  Education: Aerobic Exercise: - Group verbal and visual presentation on the components of exercise prescription. Introduces F.I.T.T principle from ACSM  for exercise prescriptions.  Reviews F.I.T.T. principles of aerobic exercise including progression. Written material given at graduation. Flowsheet Row Pulmonary Rehab from 11/17/2022 in [facility] Cardiac and Pulmonary Rehab  Education need identified 09/22/22       Education: Resistance Exercise: - Group verbal and visual presentation on the components of exercise prescription. Introduces F.I.T.T principle from ACSM for exercise prescriptions  Reviews F.I.T.T. principles of resistance exercise including progression. Written material given at graduation. Flowsheet Row Pulmonary Rehab from 11/17/2022 in [facility] Cardiac and Pulmonary Rehab  Date 10/04/22  Educator NT  Instruction Review Code 1- Verbalizes Understanding        Education: Exercise & Equipment Safety: - Individual verbal instruction and demonstration of equipment use and safety with use of the equipment. Flowsheet Row Pulmonary Rehab from 11/17/2022 in [facility] Cardiac and Pulmonary Rehab  Date 09/12/22  Educator [facility]  Instruction Review Code 1- Verbalizes Understanding       Education: Exercise Physiology & General Exercise Guidelines: - Group verbal and written instruction with models to review the exercise physiology of the cardiovascular system and associated critical values. Provides general exercise guidelines with specific guidelines to those with heart or lung disease.    Education: Flexibility, Balance, Mind/Body Relaxation: - Group verbal and visual presentation with interactive activity on the components of exercise prescription. Introduces F.I.T.T principle from ACSM for exercise  prescriptions. Reviews F.I.T.T. principles of flexibility and balance exercise training including progression. Also discusses the mind body connection.  Reviews various relaxation techniques to help reduce and manage stress (i.e. Deep breathing, progressive muscle relaxation, and visualization). Balance handout provided to take home. Written material given at graduation. Flowsheet Row Pulmonary Rehab from 11/17/2022 in [facility] Cardiac and Pulmonary Rehab  Date 10/04/22  Educator NT  Instruction Review Code 1- Verbalizes Understanding       Activity Barriers & Risk Stratification:  Activity Barriers & Cardiac Risk Stratification - 09/22/22 1327       Activity Barriers & Cardiac Risk Stratification   Activity Barriers Left Knee Replacement;Right Hip Replacement;Other (comment);Shortness of Breath    Comments Hip Pain from Aneurysm    Cardiac Risk Stratification High             6 Minute Walk:  6 Minute Walk     Row Name 11/21/22 1338         6 Minute Walk   Phase Initial  Completed on 09/22/2022     Distance 1215 feet     Walk Time 6 minutes     # of Rest Breaks 0     MPH 2.3     METS 2.38     RPE 11     Perceived Dyspnea  1     VO2 Peak 8.35     Symptoms Yes (comment)     Comments Hip pain 4/10     Resting HR 66 bpm     Resting BP 102/60     Resting Oxygen Saturation  96 %     Exercise Oxygen Saturation  during 6 min walk 94 %     Max Ex. HR 88 bpm     Max Ex. BP 118/76     2 Minute Post BP 108/68             Oxygen Initial Assessment:  Oxygen Initial Assessment - 09/12/22 1112       Home Oxygen   Home Oxygen Device None    Sleep Oxygen Prescription None    Home Exercise Oxygen Prescription  None    Home Resting Oxygen Prescription None      Initial 6 min Walk   Oxygen Used None      Program Oxygen Prescription   Program Oxygen Prescription None      Intervention   Short Term Goals To learn and understand importance of monitoring SPO2 with pulse  oximeter and demonstrate accurate use of the pulse oximeter.;To learn and understand importance of maintaining oxygen saturations>88%;To learn and demonstrate proper pursed lip breathing techniques or other breathing techniques. ;To learn and exhibit compliance with exercise, home and travel O2 prescription    Long  Term Goals Verbalizes importance of monitoring SPO2 with pulse oximeter and return demonstration;Maintenance of O2 saturations>88%;Exhibits proper breathing techniques, such as pursed lip breathing or other method taught during program session;Compliance with respiratory medication;Exhibits compliance with exercise, home  and travel O2 prescription             Oxygen Re-Evaluation:  Oxygen Re-Evaluation     Row Name 09/27/22 1042 10/27/22 1043 11/24/22 1011 12/20/22 1011 01/31/23 1013     Program Oxygen Prescription   Program Oxygen Prescription None None None None None     Home Oxygen   Home Oxygen Device None None None None None   Sleep Oxygen Prescription None None None None None   Home Exercise Oxygen Prescription None None None None None   Home Resting Oxygen Prescription None None None None None     Goals/Expected Outcomes   Short Term Goals To learn and demonstrate proper pursed lip breathing techniques or other breathing techniques.  To learn and demonstrate proper pursed lip breathing techniques or other breathing techniques. ;To learn and understand importance of monitoring SPO2 with pulse oximeter and demonstrate accurate use of the pulse oximeter.;To learn and understand importance of maintaining oxygen saturations>88% To learn and demonstrate proper pursed lip breathing techniques or other breathing techniques. ;To learn and understand importance of monitoring SPO2 with pulse oximeter and demonstrate accurate use of the pulse oximeter.;To learn and understand importance of maintaining oxygen saturations>88% To learn and demonstrate proper pursed lip breathing  techniques or other breathing techniques.  To learn and demonstrate proper pursed lip breathing techniques or other breathing techniques.    Long  Term Goals Exhibits proper breathing techniques, such as pursed lip breathing or other method taught during program session Exhibits proper breathing techniques, such as pursed lip breathing or other method taught during program session;Verbalizes importance of monitoring SPO2 with pulse oximeter and return demonstration;Maintenance of O2 saturations>88%;Compliance with respiratory medication Exhibits proper breathing techniques, such as pursed lip breathing or other method taught during program session;Verbalizes importance of monitoring SPO2 with pulse oximeter and return demonstration;Maintenance of O2 saturations>88%;Compliance with respiratory medication Exhibits proper breathing techniques, such as pursed lip breathing or other method taught during program session Exhibits proper breathing techniques, such as pursed lip breathing or other method taught during program session   Comments Reviewed PLB technique with pt.  Talked about how it works and it's importance in maintaining their exercise saturations. Stuart Harvey is doing well in rehab.  He is doing well with using his PLB.  His saturations are doing well and he tries to keep any eye on them.  He feels that his breathing is starting to improve. Stuart Harvey continues to work on his PLB and finds it helpful when he feels SOB.  His saturations are continuing to do well.  His breathing is better than he started but the heart failure continues to limit his breathing.  Diaphragmatic and PLB breathing explained and performed with patient. Patient has a better understanding of how to do these exercises to help with breathing performance and relaxation. Patient performed breathing techniques adequately and to practice further at home. Aside from being sick recently, Stuart Harvey reports that his shortness of breath has improved. Stuart Harvey  reports typically staying active, but does not do structured exercise outside of rehab. Reiterated importance of aerobic exercise and the recommendations of 150 minutes of moderate activity per week. Suggested exercises like walking outside as the weather permits and using silver sneakers to attend a gym.   Goals/Expected Outcomes Short: Become more profiecient at using PLB. Long: Become independent at using PLB. Short: Continue to use PLB Long; Conitnue to work on breathing Short: Continue to use PLB Long; Conitnue to work on breathing Short: practice PLB and diaphragmatic breathing at home. Long: Use PLB and diaphragmatic breathing independently post [employer]. Short: practice PLB and diaphragmatic breathing at home, exercise outside of rehab. Long: Use PLB and diaphragmatic breathing independently post [employer].            Oxygen Discharge (Final Oxygen Re-Evaluation):  Oxygen Re-Evaluation - 01/31/23 1013       Program Oxygen Prescription   Program Oxygen Prescription None      Home Oxygen   Home Oxygen Device None    Sleep Oxygen Prescription None    Home Exercise Oxygen Prescription None    Home Resting Oxygen Prescription None      Goals/Expected Outcomes   Short Term Goals To learn and demonstrate proper pursed lip breathing techniques or other breathing techniques.     Long  Term Goals Exhibits proper breathing techniques, such as pursed lip breathing or other method taught during program session    Comments Aside from being sick recently, Stuart Harvey reports that his shortness of breath has improved. Stuart Harvey reports typically staying active, but does not do structured exercise outside of rehab. Reiterated importance of aerobic exercise and the recommendations of 150 minutes of moderate activity per week. Suggested exercises like walking outside as the weather permits and using silver sneakers to attend a gym.    Goals/Expected Outcomes Short: practice PLB and diaphragmatic breathing at  home, exercise outside of rehab. Long: Use PLB and diaphragmatic breathing independently post [employer].             Initial Exercise Prescription:  Initial Exercise Prescription - 09/22/22 1300       Date of Initial Exercise RX and Referring Provider   Date 09/22/22    Referring Provider [name], MD      Oxygen   Maintain Oxygen Saturation 88% or higher      Treadmill   MPH 2    Grade 0.5    Minutes 15    METs 2.67      Recumbant Bike   Level 1    RPM 50    Watts 12    Minutes 15    METs 2.38      REL-XR   Level 1    Watts 12    Speed 50    Minutes 15    METs 2.38      Prescription Details   Frequency (times per week) 2    Duration Progress to 30 minutes of continuous aerobic without signs/symptoms of physical distress      Intensity   THRR 40-80% of Max Heartrate 99-134    Ratings of Perceived Exertion 11-13    Perceived Dyspnea 0-4  Progression   Progression Continue to progress workloads to maintain intensity without signs/symptoms of physical distress.      Resistance Training   Training Prescription Yes    Weight 4 lb    Reps 10-15             Perform Capillary Blood Glucose checks as needed.  Exercise Prescription Changes:   Exercise Prescription Changes     Row Name 09/22/22 1300 10/10/22 1300 10/24/22 0900 11/01/22 1000 11/08/22 1300     Response to Exercise   Blood Pressure (Admit) 102/60 124/70 112/64 -- 98/56   Blood Pressure (Exercise) 118/76 142/74 146/72 -- 118/62   Blood Pressure (Exit) 108/68 108/70 100/64 -- 96/52   Heart Rate (Admit) 66 bpm 78 bpm 78 bpm -- 94 bpm   Heart Rate (Exercise) 88 bpm 101 bpm 90 bpm -- 130 bpm   Heart Rate (Exit) 69 bpm 79 bpm 83 bpm -- 80 bpm   Oxygen Saturation (Admit) 96 % 98 % 97 % -- 96 %   Oxygen Saturation (Exercise) 94 % 94 % 95 % -- 90 %   Oxygen Saturation (Exit) 97 % 96 % 96 % -- 98 %   Rating of Perceived Exertion (Exercise) 11 12 12  -- 12   Perceived Dyspnea  (Exercise) 1 2 1  -- 1   Symptoms Hip Pain 4/10 Dizziness none -- none   Comments 6MWT Results First 3 sessions of rehab -- -- --   Duration -- Progress to 30 minutes of  aerobic without signs/symptoms of physical distress Continue with 30 min of aerobic exercise without signs/symptoms of physical distress. -- Continue with 30 min of aerobic exercise without signs/symptoms of physical distress.   Intensity -- THRR unchanged THRR unchanged -- THRR unchanged     Progression   Progression -- Continue to progress workloads to maintain intensity without signs/symptoms of physical distress. Continue to progress workloads to maintain intensity without signs/symptoms of physical distress. -- Continue to progress workloads to maintain intensity without signs/symptoms of physical distress.   Average METs -- 2.66 3.24 -- 3.07     Resistance Training   Training Prescription -- Yes Yes -- Yes   Weight -- 4 lb\ 4 lb -- 6 lb   Reps -- 10-15 10-15 -- 10-15     Interval Training   Interval Training -- No No -- No     Treadmill   MPH -- 1.5 2.1 -- 2.1   Grade -- 0 0 -- 0.5   Minutes -- 15 15 -- 15   METs -- 2.15 2.61 -- 2.75     Recumbant Bike   Level -- -- 5 -- 3   Watts -- -- 33 -- 34   Minutes -- -- 15 -- 15   METs -- -- 2.39 -- --     NuStep   Level -- 3 -- -- 4   Minutes -- 15 -- -- 15   METs -- 2 -- -- 3.9     REL-XR   Level -- 1 2 -- 2   Minutes -- 15 15 -- 15   METs -- 3.9 4 -- 3.5     Home Exercise Plan   Plans to continue exercise at -- -- -- Home (comment)  walking Home (comment)  walking   Frequency -- -- -- Add 3 additional days to program exercise sessions. Add 3 additional days to program exercise sessions.   Initial Home Exercises Provided -- -- -- 11/01/22 11/01/22  Oxygen   Maintain Oxygen Saturation -- 88% or higher 88% or higher -- 88% or higher    Row Name 11/21/22 1000 12/05/22 1400 12/19/22 1100 01/02/23 1300 01/16/23 1000     Response to Exercise   Blood  Pressure (Admit) 116/70 96/62 104/64 124/64 100/64   Blood Pressure (Exercise) -- -- 120/58 -- --   Blood Pressure (Exit) 110/60 104/70 96/60 104/64 100/60   Heart Rate (Admit) 80 bpm 52 bpm 81 bpm 78 bpm 78 bpm   Heart Rate (Exercise) 93 bpm 96 bpm 98 bpm 120 bpm 96 bpm   Heart Rate (Exit) 80 bpm 93 bpm 85 bpm 89 bpm 79 bpm   Oxygen Saturation (Admit) 98 % 93 % 99 % 97 % 96 %   Oxygen Saturation (Exercise) 94 % 95 % 97 % 94 % 94 %   Oxygen Saturation (Exit) 98 % 96 % 96 % 96 % 95 %   Rating of Perceived Exertion (Exercise) 11 14 13 13 13    Perceived Dyspnea (Exercise) 0 2 2 2 2    Symptoms dizziness- resolved with rest none SOB SOB SOB   Duration Continue with 30 min of aerobic exercise without signs/symptoms of physical distress. Continue with 30 min of aerobic exercise without signs/symptoms of physical distress. Continue with 30 min of aerobic exercise without signs/symptoms of physical distress. Continue with 30 min of aerobic exercise without signs/symptoms of physical distress. Continue with 30 min of aerobic exercise without signs/symptoms of physical distress.   Intensity THRR unchanged THRR unchanged THRR unchanged THRR unchanged THRR unchanged     Progression   Progression Continue to progress workloads to maintain intensity without signs/symptoms of physical distress. Continue to progress workloads to maintain intensity without signs/symptoms of physical distress. Continue to progress workloads to maintain intensity without signs/symptoms of physical distress. Continue to progress workloads to maintain intensity without signs/symptoms of physical distress. Continue to progress workloads to maintain intensity without signs/symptoms of physical distress.   Average METs 2.93 2.84 3.3 3.92 3.09     Resistance Training   Training Prescription Yes Yes Yes Yes Yes   Weight 6 lb 6 lb 5 lb 6 lb 6 lb   Reps 10-15 10-15 10-15 10-15 10-15     Interval Training   Interval Training No No No No  No     Treadmill   MPH 2.1 2 2.2 2.2 2.2   Grade 0 0 1.5 1.5 1.5   Minutes 15 15 15 15 15    METs 2.61 2.53 3.14 3.14 3.14     Recumbant Bike   Level 4 -- 5 -- --   Watts 40 -- 47 -- --   Minutes 15 -- 15 -- --   METs 3.26 -- 3.51 -- --     NuStep   Level -- -- -- 4 3   Minutes -- -- -- 15 15   METs -- -- -- 8.2 5.2     Recumbant Elliptical   Level -- -- -- 1 --   Minutes -- -- -- 15 --   METs -- -- -- 1.8 --     REL-XR   Level -- -- -- 4 4   Minutes -- -- -- 15 15   METs -- -- -- 4.1 3.7     T5 Nustep   Level -- -- -- -- 3   Minutes -- -- -- -- 15   METs -- -- -- -- 2.5     Biostep-RELP   Level --  2 4 -- --   Minutes -- 15 15 -- --   METs -- 3 4 -- --     Home Exercise Plan   Plans to continue exercise at Home (comment)  walking Home (comment)  walking Home (comment)  walking Home (comment)  walking Home (comment)  walking   Frequency Add 3 additional days to program exercise sessions. Add 3 additional days to program exercise sessions. Add 3 additional days to program exercise sessions. Add 3 additional days to program exercise sessions. Add 3 additional days to program exercise sessions.   Initial Home Exercises Provided 11/01/22 11/01/22 11/01/22 11/01/22 11/01/22     Oxygen   Maintain Oxygen Saturation 88% or higher 88% or higher 88% or higher 88% or higher 88% or higher            Exercise Comments:   Exercise Comments     Row Name 09/27/22 1041           Exercise Comments First full day of exercise!  Patient was oriented to gym and equipment including functions, settings, policies, and procedures.  Patient's individual exercise prescription and treatment plan were reviewed.  All starting workloads were established based on the results of the 6 minute walk test done at initial orientation visit.  The plan for exercise progression was also introduced and progression will be customized based on patient's performance and goals.                 Exercise Goals and Review:   Exercise Goals     Row Name 09/22/22 1334             Exercise Goals   Increase Physical Activity Yes       Intervention Provide advice, education, support and counseling about physical activity/exercise needs.;Develop an individualized exercise prescription for aerobic and resistive training based on initial evaluation findings, risk stratification, comorbidities and participant's personal goals.       Expected Outcomes Short Term: Attend rehab on a regular basis to increase amount of physical activity.;Long Term: Add in home exercise to make exercise part of routine and to increase amount of physical activity.;Long Term: Exercising regularly at least 3-5 days a week.       Increase Strength and Stamina Yes       Intervention Provide advice, education, support and counseling about physical activity/exercise needs.;Develop an individualized exercise prescription for aerobic and resistive training based on initial evaluation findings, risk stratification, comorbidities and participant's personal goals.       Expected Outcomes Short Term: Increase workloads from initial exercise prescription for resistance, speed, and METs.;Short Term: Perform resistance training exercises routinely during rehab and add in resistance training at home;Long Term: Improve cardiorespiratory fitness, muscular endurance and strength as measured by increased METs and functional capacity (6MWT)       Able to understand and use rate of perceived exertion (RPE) scale Yes       Intervention Provide education and explanation on how to use RPE scale       Expected Outcomes Short Term: Able to use RPE daily in rehab to express subjective intensity level;Long Term:  Able to use RPE to guide intensity level when exercising independently       Able to understand and use Dyspnea scale Yes       Intervention Provide education and explanation on how to use Dyspnea scale       Expected Outcomes  Short Term: Able to use Dyspnea scale daily in  rehab to express subjective sense of shortness of breath during exertion;Long Term: Able to use Dyspnea scale to guide intensity level when exercising independently       Knowledge and understanding of Target Heart Rate Range (THRR) Yes       Intervention Provide education and explanation of THRR including how the numbers were predicted and where they are located for reference       Expected Outcomes Long Term: Able to use THRR to govern intensity when exercising independently;Short Term: Able to state/look up THRR;Short Term: Able to use daily as guideline for intensity in rehab       Able to check pulse independently Yes       Intervention Provide education and demonstration on how to check pulse in carotid and radial arteries.;Review the importance of being able to check your own pulse for safety during independent exercise       Expected Outcomes Short Term: Able to explain why pulse checking is important during independent exercise;Long Term: Able to check pulse independently and accurately       Understanding of Exercise Prescription Yes       Intervention Provide education, explanation, and written materials on patient's individual exercise prescription       Expected Outcomes Short Term: Able to explain program exercise prescription;Long Term: Able to explain home exercise prescription to exercise independently                Exercise Goals Re-Evaluation :  Exercise Goals Re-Evaluation     Row Name 09/27/22 1041 10/10/22 1340 10/24/22 0906 10/27/22 1033 11/01/22 1002     Exercise Goal Re-Evaluation   Exercise Goals Review Able to understand and use rate of perceived exertion (RPE) scale;Able to understand and use Dyspnea scale;Knowledge and understanding of Target Heart Rate Range (THRR);Understanding of Exercise Prescription Understanding of Exercise Prescription;Increase Physical Activity;Increase Strength and Stamina Understanding of  Exercise Prescription;Increase Physical Activity;Increase Strength and Stamina Understanding of Exercise Prescription;Increase Physical Activity;Increase Strength and Stamina Understanding of Exercise Prescription;Increase Physical Activity;Increase Strength and Stamina;Able to understand and use rate of perceived exertion (RPE) scale;Knowledge and understanding of Target Heart Rate Range (THRR);Able to understand and use Dyspnea scale;Able to check pulse independently   Comments Reviewed RPE scale, THR and program prescription with pt today.  Pt voiced understanding and was given a copy of goals to take home. Stuart Harvey is off to a good start in rehab. He had an overall average MET level of 2.66 METs during his first three sessions. He also did well with level 3 on the T4 and level 1 on the XR. He started on the treadmill at 1.5 mph and no incline. We will continue to monitor his progress in the program. Stuart Harvey continues to do well in rehab. He increased to level 5 on the recumbent bike and worked up to 33 watts! He was also able to increase his speed on the treadmill back up to 2.1 mph, which is closer to where his initial exercise prescription was. We will continue to monitor. Stuart Harvey is doing well in rehab.  He is not really doing exercise at home. He is staying active by doing yard work. He does feel like his strength and stamina are getting better. Reviewed home exercise with pt today.  Pt plans to walk and use weights at home for exercise.  Reviewed THR, pulse, RPE, sign and symptoms, pulse oximetery and when to call 911 or MD.  Also discussed weather considerations and indoor options.  Pt voiced  understanding.   Expected Outcomes Short: Use RPE daily to regulate intensity. Long: Follow program prescription in THR. Short: increase speed on the treadmill. Long: Continue to increase strength and stamina. Short: Add on incline to treadmill now that speed is up Long: Continue to increase overall MET level Short: Start  to add in more exercise at home Long: Continue to improve stamina Short: wallk more on off days Long: conitnue to exercise independently    Row Name 11/08/22 1321 11/21/22 1047 11/24/22 1005 12/05/22 1433 12/19/22 1122     Exercise Goal Re-Evaluation   Exercise Goals Review Increase Physical Activity;Increase Strength and Stamina;Understanding of Exercise Prescription Increase Physical Activity;Increase Strength and Stamina;Understanding of Exercise Prescription Increase Physical Activity;Increase Strength and Stamina;Understanding of Exercise Prescription Increase Physical Activity;Increase Strength and Stamina;Understanding of Exercise Prescription Increase Physical Activity;Increase Strength and Stamina;Understanding of Exercise Prescription   Comments Stuart Harvey is doing well in rehab. He has continued to consistently work at a MET level above 3 METs. He has also added incline on the treadmill at 0.5% while maintaining a speed of 2.1 mph. He improved to level 4 on the T4, and has continued to do well with level 2 on the XR. We will continue to monitor his progress. Stuart Harvey has been out of rehab for several sessions due to symptoms of dizziness and lightheadedness. Patient was brought to his cardiologist office after 1 rehab session due to continuous symptoms of dizziness which we thought was due to his medications as his BP was running low. His doctor saw him and made some medication changes that we hope will make him feel better overall. He did return back to rehab after that for once session and did well. He BP was stable and he felt food. He increased to level 4 on the recumbent bike and worked up to [employer] We will continue to monitor. Stuart Harvey is still having some spells but they did readjust his meds to help.  His anuerysm repair in his leg is still causing a lot of pain for him.  He is able to tolerate some walking, but walking in from parking lot is a hard on him.  He is walking some at home. Stuart Harvey is  doing well in rehab. He recently began using the biostep and did well with level 2. He has also continued to do well with 6 lb hand weights for resistance training. He is walking at 2 mph on the treadmill with no incline, but may benefit from increasing his speed. We will continue to monitor his progress. Stuart Harvey continues to do well in rehab. He has been experiencing some low BP but not as often before and is not often symptomatic with them. He has been staying hydrated and still in contact with his doctor. He increased on his levels on both the Biostep and recumbent bike, specifically where he has worked up to 47 watts! He also increased his workload on the treadmill to a 2.2 mph/1.5% incline. RPEs are staying in appropriate range. We will continue to monitor.   Expected Outcomes Short: Continue to increase workload on the treadmill. Long: Continue to improve strength and stamina. Short: Continue to slowly increase loads and ease back into exercise Long: Continue to build up overall strength and stamina Short: Continue to walk to build stamina. Long: Conitnue to keep eye on leg pain. Short: Try increased speed on treadmill. Long: Continue to walk to build up stamina. Short: Continue to increase incline on treadmill Long: Continue to increase overall MET  level and stamina    Row Name 01/02/23 1337 01/16/23 1103 01/30/23 1417 01/31/23 1011       Exercise Goal Re-Evaluation   Exercise Goals Review Increase Physical Activity;Increase Strength and Stamina;Understanding of Exercise Prescription Increase Physical Activity;Increase Strength and Stamina;Understanding of Exercise Prescription Increase Physical Activity;Increase Strength and Stamina;Understanding of Exercise Prescription Increase Physical Activity;Increase Strength and Stamina;Understanding of Exercise Prescription    Comments Stuart Harvey is doing well in rehab. He recently increased his overall average MET level to 3.92 METs. He also improved to level 4 on  the XR and has consistently walked on the treadmill at a speed of 2.2 mph and an incline of 1.5%. He increased from 5 lb to 6 lb hand weights for resistance training as well. We will continue to monitor his progress in the program. Stuart Harvey continues to do well in rehab. He has stayed consistent at level 3 on the T4 Nustep and level 4 on the XR, he would benefit from increasing his Nustep level. His oxygen levels are staying above 88% and RPEs are staying in appropriate range. He would also benefit from increasing the treadmill speed beyond 2.2 mph. We will continue to monitor. Stuart Harvey has not attended rehab since the last review due to being sick. We will follow up with patient to see how he is feeling and set a return date. We will continue to monitor his progress once he returns to the program. Stuart Harvey reports no structured exercise, but stays active in the yard. Reiterated importance of aerobic exercise and the recommendations of 150 minutes of moderate activity per week. Suggested exercises like walking outside as the weather permits and using silver sneakers to attend a gym.    Expected Outcomes Short: Continue to increase treadmill workload. Long: Continue to improve strength and stamina. Short: Increase treadmill speed Long: Continue to increase overall MET level and stamina Short: Return to rehab once recovered from sickness. Long: Continue to improve strength and stamina. Short: work up to 150 minutes of moderate aerobic exercise per week, activities like walking or joining a gym. Long: Continue to improve strength and stamina.             Discharge Exercise Prescription (Final Exercise Prescription Changes):  Exercise Prescription Changes - 01/16/23 1000       Response to Exercise   Blood Pressure (Admit) 100/64    Blood Pressure (Exit) 100/60    Heart Rate (Admit) 78 bpm    Heart Rate (Exercise) 96 bpm    Heart Rate (Exit) 79 bpm    Oxygen Saturation (Admit) 96 %    Oxygen Saturation  (Exercise) 94 %    Oxygen Saturation (Exit) 95 %    Rating of Perceived Exertion (Exercise) 13    Perceived Dyspnea (Exercise) 2    Symptoms SOB    Duration Continue with 30 min of aerobic exercise without signs/symptoms of physical distress.    Intensity THRR unchanged      Progression   Progression Continue to progress workloads to maintain intensity without signs/symptoms of physical distress.    Average METs 3.09      Resistance Training   Training Prescription Yes    Weight 6 lb    Reps 10-15      Interval Training   Interval Training No      Treadmill   MPH 2.2    Grade 1.5    Minutes 15    METs 3.14      NuStep   Level 3  Minutes 15    METs 5.2      REL-XR   Level 4    Minutes 15    METs 3.7      T5 Nustep   Level 3    Minutes 15    METs 2.5      Home Exercise Plan   Plans to continue exercise at Home (comment)   walking   Frequency Add 3 additional days to program exercise sessions.    Initial Home Exercises Provided 11/01/22      Oxygen   Maintain Oxygen Saturation 88% or higher             Nutrition:  Target Goals: Understanding of nutrition guidelines, daily intake of sodium 1500mg , cholesterol 200mg , calories 30% from fat and 7% or less from saturated fats, daily to have 5 or more servings of fruits and vegetables.  Education: All About Nutrition: -Group instruction provided by verbal, written material, interactive activities, discussions, models, and posters to present general guidelines for heart healthy nutrition including fat, fiber, MyPlate, the role of sodium in heart healthy nutrition, utilization of the nutrition label, and utilization of this knowledge for meal planning. Follow up email sent as well. Written material given at graduation. Flowsheet Row Pulmonary Rehab from 11/17/2022 in [facility]  Education need identified 09/22/22  Date 10/13/22  [Nutrition 2]  Educator [facility]  Instruction Review Code 1-  Verbalizes Understanding       Biometrics:  Pre Biometrics - 09/22/22 1334       Pre Biometrics   Height 5\' 11"  (1.803 m)    Weight 216 lb 3.2 oz (98.1 kg)    BMI (Calculated) 30.17    Single Leg Stand 5 seconds   R             Nutrition Therapy Plan and Nutrition Goals:  Nutrition Therapy & Goals - 09/22/22 1049       Nutrition Therapy   Diet Heart healthy, low Na, CKD stg 3 MNT    Drug/Food Interactions Statins/Certain Fruits    Protein (specify units) 85-95g    Fiber 30 grams    Whole Grain Foods 3 servings   Depending on labwork   Saturated Fats 16 max. grams    Fruits and Vegetables 8 servings/day    Sodium 1.5 grams      Personal Nutrition Goals   Nutrition Goal ST: practice MyPlate guidelines, focus on variety of plant foods LT: limit saturated fat <16g/day, limit sodium intake <1.5g/day, eat at least 30g of fiber per day, alter potassium and phosphorus intake based of of labwork for CKD.    Comments 72 y.o. M admitted to pulmonary rehab for chronic HF. PMHx includes CAD, MI 2008, HLD, HTN, CKD stg 3, AAA s/p EVAR (2008), paroxysmal A-fib, HFrEF, hypothyroidism, current tobacco use (dip), arthritis, depression, GERD, diverticulosis. PSHx includes CABG 2008, EVAR 2008 and 2014, coronary/graft angiography 2019, endovascular/stent graft 2022, bilateral TKA 2017. Relevant medications includes bupropion, vit D3, citalopram, synthroid, melatonin, omeprazole, rosuvastatin. Last GFR 08/29/22 was 42. PYP Score: 56. Vegetables & Fruits 5/12. Breads, Grains & Cereals 5/12. Red & Processed Meat 7/12. Poultry 2/2. Fish & Shellfish 0/4. Beans, Nuts & Seeds 3/4. Milk & Dairy Foods 3/6. Toppings, Oils, Seasonings & Salt 17/20. Sweets, Snacks & Restaurant Food 7/14. Beverages 7/10. He reports watching sodium B: does not usually have breakfast L: low sodium ham/turkey on sandwich (white bread) D: pork chops, chicken, spaghetti, roast beef. he likes to have baked  potatoes and white beans.  He reports having red meat <2x/week. He rpeorts his wife will cook dinner. He reports his wife uses liquid plant oils. Drinks: 2 cups of coffee (2 sugars), water, sometimes sweet tea or pepsi (1-2x/day). He reports having salad every meal (cucumbers, lettuce, tomato, some cheese, [place] (2 tbsp). He reports that his wife is a great support system for him and makes sure to watch what he eats. Discussed heart healthy eating and CKD stg 3 MNT.      Intervention Plan   Intervention Prescribe, educate and counsel regarding individualized specific dietary modifications aiming towards targeted core components such as weight, hypertension, lipid management, diabetes, heart failure and other comorbidities.;Nutrition handout(s) given to patient.    Expected Outcomes Short Term Goal: Understand basic principles of dietary content, such as calories, fat, sodium, cholesterol and nutrients.;Short Term Goal: A plan has been developed with personal nutrition goals set during dietitian appointment.;Long Term Goal: Adherence to prescribed nutrition plan.             Nutrition Assessments:  MEDIFICTS Score Key: ?70 Need to make dietary changes  40-70 Heart Healthy Diet ? 40 Therapeutic Level Cholesterol Diet  Flowsheet Row Pulmonary Rehab from 09/22/2022 in [facility] Cardiac and Pulmonary Rehab  Picture Your Plate Total Score on Admission 56      Picture Your Plate Scores: [id number] Unhealthy dietary pattern with much room for improvement. 41-50 Dietary pattern unlikely to meet recommendations for good health and room for improvement. 51-60 More healthful dietary pattern, with some room for improvement.  >60 Healthy dietary pattern, although there may be some specific behaviors that could be improved.   Nutrition Goals Re-Evaluation:  Nutrition Goals Re-Evaluation     [place] Name 10/27/22 1040 11/24/22 1010 01/31/23 1001         Goals   Nutrition Goal ST: practice MyPlate guidelines, focus on variety of  plant foods LT: limit saturated fat <16g/day, limit sodium intake <1.5g/day, eat at least 30g of fiber per day, alter potassium and phosphorus intake based of of labwork for CKD. Short: Continue to use MyPlate guidelines Long: Continue to get a good variety ST: practice MyPlate guidelines, focus on variety of plant foods, choose whole grain bread for lunch LT: limit saturated fat <16g/day, limit sodium intake <1.5g/day, eat at least 30g of fiber per day, alter potassium and phosphorus intake based of of labwork for CKD.     Comment Stuart Harvey is doing well in rehab.  His diet is going well and he is getting in a good variety.  He is trying to wathin his sodium and sugar intake.  His wife is helping keep him on task with diet. Stuart Harvey is doing well in rehab.  His weight has been steady so he is feeling good with his diet.  His wife continues to make sure that he does not over do sodium or sugar.  They are also trying to get in a lot of variety into his diet. Stuart Harvey reports doing well with his diet. His wife continues to support him with his diet monitoring his sodium and added sugar as well as practicing MyPlate guidelines. Reiterated new CKD stg 3 guidance including keeping protein around 60g/day (0.6g/kg/d) and focusing on whole food sources of potassium such as fiber rich grains and vegetables while monitoring bloodwork.     Expected Outcome Short: Continue to use MyPlate guidelines Long: Continue to get a good variety Short: Continue to add in more variety Long: continue to focus on  healthy eating ST: practice MyPlate guidelines, focus on variety of plant foods, choose whole grain bread for lunch LT: limit saturated fat <16g/day, limit sodium intake <1.5g/day, eat at least 30g of fiber per day, alter potassium and phosphorus intake based of of labwork for CKD.              Nutrition Goals Discharge (Final Nutrition Goals Re-Evaluation):  Nutrition Goals Re-Evaluation - 01/31/23 1001       Goals   Nutrition  Goal ST: practice MyPlate guidelines, focus on variety of plant foods, choose whole grain bread for lunch LT: limit saturated fat <16g/day, limit sodium intake <1.5g/day, eat at least 30g of fiber per day, alter potassium and phosphorus intake based of of labwork for CKD.    Comment Stuart Harvey reports doing well with his diet. His wife continues to support him with his diet monitoring his sodium and added sugar as well as practicing MyPlate guidelines. Reiterated new CKD stg 3 guidance including keeping protein around 60g/day (0.6g/kg/d) and focusing on whole food sources of potassium such as fiber rich grains and vegetables while monitoring bloodwork.    Expected Outcome ST: practice MyPlate guidelines, focus on variety of plant foods, choose whole grain bread for lunch LT: limit saturated fat <16g/day, limit sodium intake <1.5g/day, eat at least 30g of fiber per day, alter potassium and phosphorus intake based of of labwork for CKD.             Psychosocial: Target Goals: Acknowledge presence or absence of significant depression and/or stress, maximize coping skills, provide positive support system. Participant is able to verbalize types and ability to use techniques and skills needed for reducing stress and depression.   Education: Stress, Anxiety, and Depression - Group verbal and visual presentation to define topics covered.  Reviews how body is impacted by stress, anxiety, and depression.  Also discusses healthy ways to reduce stress and to treat/manage anxiety and depression.  Written material given at graduation. Flowsheet Row Pulmonary Rehab from 11/17/2022 in [facility] Cardiac and Pulmonary Rehab  Date 11/17/22  Educator [facility]  Instruction Review Code 1- [employer] Understanding       Education: Sleep Hygiene -Provides group verbal and written instruction about how sleep can affect your health.  Define sleep hygiene, discuss sleep cycles and impact of sleep habits. Review good sleep hygiene tips.     Initial Review & Psychosocial Screening:  Initial Psych Review & Screening - 09/12/22 1114       Initial Review   Current issues with Current Psychotropic Meds;History of Depression      Family Dynamics   Good Support System? Yes    Comments Stuart Harvey can look to his wife for suppprt. He feels concerned for his health since he also has a kidney disease.      Barriers   Psychosocial barriers to participate in program The patient should benefit from training in stress management and relaxation.      Screening Interventions   Interventions Encouraged to exercise;To provide support and resources with identified psychosocial needs;Provide feedback about the scores to participant    Expected Outcomes Short Term goal: Utilizing psychosocial counselor, staff and physician to assist with identification of specific Stressors or current issues interfering with healing process. Setting desired goal for each stressor or current issue identified.;Long Term Goal: Stressors or current issues are controlled or eliminated.;Short Term goal: Identification and review with participant of any Quality of Life or Depression concerns found by scoring the questionnaire.;Long Term goal: The participant  improves quality of Life and PHQ9 Scores as seen by post scores and/or verbalization of changes             Quality of Life Scores:  Quality of Life - 09/22/22 1335       Quality of Life   Select Quality of Life      Quality of Life Scores   Health/Function Pre 20.27 %    Socioeconomic Pre 22.17 %    Psych/Spiritual Pre 22.93 %    Family Pre 28.5 %    GLOBAL Pre 22.42 %            Scores of 19 and below usually indicate a poorer quality of life in these areas.  A difference of  2-3 points is a clinically meaningful difference.  A difference of 2-3 points in the total score of the Quality of Life Index has been associated with significant improvement in overall quality of life, self-image, physical  symptoms, and general health in studies assessing change in quality of life.  PHQ-9: Review Flowsheet  More data exists      01/11/2023 10/11/2022 09/22/2022 08/09/2022 09/29/2021  Depression screen PHQ 2/9  Decreased Interest 0 1 0 0 0  Down, Depressed, Hopeless 0 0 0 0 0  PHQ - 2 Score 0 1 0 0 0  Altered sleeping 1 1 1  0 0  Tired, decreased energy 1 1 1 1 1   Change in appetite 0 0 0 1 0  Feeling bad or failure about yourself  0 0 0 0 0  Trouble concentrating 0 1 1 0 0  Moving slowly or fidgety/restless 0 0 0 0 0  Suicidal thoughts 0 0 0 0 0  PHQ-9 Score 2 4 3 2 1   Difficult doing work/chores Not difficult at all Not difficult at all Somewhat difficult Not difficult at all Not difficult at all   Interpretation of Total Score  Total Score Depression Severity:  1-4 = Minimal depression, 5-9 = Mild depression, 10-14 = Moderate depression, 15-19 = Moderately severe depression, 20-27 = Severe depression   Psychosocial Evaluation and Intervention:  Psychosocial Evaluation - 09/12/22 1115       Psychosocial Evaluation & Interventions   Interventions Encouraged to exercise with the program and follow exercise prescription;Relaxation education;Stress management education    Comments Stuart Harvey can look to his wife for suppprt. He feels concerned for his health since he also has a kidney disease.    Expected Outcomes Short: Start [facility] to help with mood. Long: Maintain a healthy mental state    Continue Psychosocial Services  Follow up required by staff             Psychosocial Re-Evaluation:  Psychosocial Re-Evaluation     [place] Name 10/27/22 1037 11/24/22 1007 12/20/22 1013 01/31/23 1007       Psychosocial Re-Evaluation   Current issues with Current Stress Concerns Current Stress Concerns Current Stress Concerns;Current Psychotropic Meds;History of Depression Current Stress Concerns;Current Psychotropic Meds;History of Depression    Comments Stuart Harvey is doing well in rehab.  He had a  tough week as he was awaiting results from his PET scan but her got good results and they did not find any cancer!!  He was very excited with these results!  He continues to sleep well ad has a good support system at home. Stuart Harvey had some med changes to help with dizzy spells and is doing better.  He is in the donut hole with insurance and trying to pay for  his medicaitons.  He continues to have pain from anuersym repair that he is constantly dealing wiht.  He sleeps good some night and not so good other nights.  He feels decent with what he is able to get. Stuart Harvey feels less dizzy and more improved since he is switching his medications up. He is going to talk to his doctor about his blood pressure medications and possible cut the doses down. His blood pressure being low was getting to his mood. We checked his blood pressure manualy agains his automatic cuff. The readings were the same. He did not take his blood pressure medication to see if he would not be so low and  dizzy. He is talking to his doctor and wants to continue getting better. Stuart Harvey reports sleeping well for the most part and reports no stressors at this time. He enjoys yardwork to relax him and is excited for spring weather. He relies on his wife and sons who live close for support. He reports thats he is not experiencing dizziness anymore with medications.    Expected Outcomes Short: Continue to exercise for mental boost long; Continue to stay positive Short: Continue to manage pain Long: Continue to exercise for mental boost Short: Continue to manage BP medications. Long: Continue to maintain BP medcations and mood independently. Short: Continue to attend pulmonary rehab for mental health boost Long: Continue to maintain positive attitude    Interventions Encouraged to attend Pulmonary Rehabilitation for the exercise Encouraged to attend Pulmonary Rehabilitation for the exercise Encouraged to attend Pulmonary Rehabilitation for the exercise  Encouraged to attend Pulmonary Rehabilitation for the exercise    Continue Psychosocial Services  Follow up required by staff Follow up required by staff Follow up required by staff Follow up required by staff             Psychosocial Discharge (Final Psychosocial Re-Evaluation):  Psychosocial Re-Evaluation - 01/31/23 1007       Psychosocial Re-Evaluation   Current issues with Current Stress Concerns;Current Psychotropic Meds;History of Depression    Comments Stuart Harvey reports sleeping well for the most part and reports no stressors at this time. He enjoys yardwork to relax him and is excited for spring weather. He relies on his wife and sons who live close for support. He reports thats he is not experiencing dizziness anymore with medications.    Expected Outcomes Short: Continue to attend pulmonary rehab for mental health boost Long: Continue to maintain positive attitude    Interventions Encouraged to attend Pulmonary Rehabilitation for the exercise    Continue Psychosocial Services  Follow up required by staff             Education: Education Goals: Education classes will be provided on a weekly basis, covering required topics. Participant will state understanding/return demonstration of topics presented.  Learning Barriers/Preferences:  Learning Barriers/Preferences - 09/12/22 1113       Learning Barriers/Preferences   Learning Barriers None    Learning Preferences None             General Pulmonary Education Topics:  Infection Prevention: - Provides verbal and written material to individual with discussion of infection control including proper hand washing and proper equipment cleaning during exercise session. Flowsheet Row Pulmonary Rehab from 11/17/2022 in [facility] Cardiac and Pulmonary Rehab  Date 09/12/22  Educator [facility]  Instruction Review Code 1- Verbalizes Understanding       Falls Prevention: - Provides verbal and written material to individual with discussion  of falls prevention and  safety. Flowsheet Row Pulmonary Rehab from 11/17/2022 in [facility] Cardiac and Pulmonary Rehab  Date 09/12/22  Educator [facility]  Instruction Review Code 1- Verbalizes Understanding       Chronic Lung Disease Review: - Group verbal instruction with posters, models, PowerPoint presentations and videos,  to review new updates, new respiratory medications, new advancements in procedures and treatments. Providing information on websites and "800" numbers for continued self-education. Includes information about supplement oxygen, available portable oxygen systems, continuous and intermittent flow rates, oxygen safety, concentrators, and Medicare reimbursement for oxygen. Explanation of Pulmonary Drugs, including class, frequency, complications, importance of spacers, rinsing mouth after steroid MDI's, and proper cleaning methods for nebulizers. Review of basic lung anatomy and physiology related to function, structure, and complications of lung disease. Review of risk factors. Discussion about methods for diagnosing sleep apnea and types of masks and machines for OSA. Includes a review of the use of types of environmental controls: home humidity, furnaces, filters, dust mite/pet prevention, HEPA vacuums. Discussion about weather changes, air quality and the benefits of nasal washing. Instruction on Warning signs, infection symptoms, calling MD promptly, preventive modes, and value of vaccinations. Review of effective airway clearance, coughing and/or vibration techniques. Emphasizing that all should Create an Action Plan. Written material given at graduation.   AED/CPR: - Group verbal and written instruction with the use of models to demonstrate the basic use of the AED with the basic ABC's of resuscitation.    Anatomy and Cardiac Procedures: - Group verbal and visual presentation and models provide information about basic cardiac anatomy and function. Reviews the testing methods done to  diagnose heart disease and the outcomes of the test results. Describes the treatment choices: Medical Management, Angioplasty, or Coronary Bypass Surgery for treating various heart conditions including Myocardial Infarction, Angina, Valve Disease, and Cardiac Arrhythmias.  Written material given at graduation. Flowsheet Row Pulmonary Rehab from 11/17/2022 in [facility] Cardiac and Pulmonary Rehab  Education need identified 09/22/22       Medication Safety: - Group verbal and visual instruction to review commonly prescribed medications for heart and lung disease. Reviews the medication, class of the drug, and side effects. Includes the steps to properly store meds and maintain the prescription regimen.  Written material given at graduation.   Other: -Provides group and verbal instruction on various topics (see comments)   Knowledge Questionnaire Score:  Knowledge Questionnaire Score - 09/22/22 1336       Knowledge Questionnaire Score   Pre Score 22/26              Core Components/Risk Factors/Patient Goals at Admission:  Personal Goals and Risk Factors at Admission - 09/22/22 1336       Core Components/Risk Factors/Patient Goals on Admission    Weight Management Yes;Weight Maintenance    Intervention Weight Management: Develop a combined nutrition and exercise program designed to reach desired caloric intake, while maintaining appropriate intake of nutrient and fiber, sodium and fats, and appropriate energy expenditure required for the weight goal.;Weight Management: Provide education and appropriate resources to help participant work on and attain dietary goals.;Weight Management/Obesity: Establish reasonable short term and long term weight goals.    Admit Weight 216 lb 3.2 oz (98.1 kg)    Goal Weight: Short Term 210 lb (95.3 kg)    Goal Weight: Long Term 200 lb (90.7 kg)    Expected Outcomes Weight Maintenance: Understanding of the daily nutrition guidelines, which includes 25-35%  calories from fat, 7% or less cal from saturated fats, less  than 200mg  cholesterol, less than 1.5gm of sodium, & 5 or more servings of fruits and vegetables daily;Long Term: Adherence to nutrition and physical activity/exercise program aimed toward attainment of established weight goal;Short Term: Continue to assess and modify interventions until short term weight is achieved;Understanding recommendations for meals to include 15-35% energy as protein, 25-35% energy from fat, 35-60% energy from carbohydrates, less than 200mg  of dietary cholesterol, 20-35 gm of total fiber daily;Understanding of distribution of calorie intake throughout the day with the consumption of 4-5 meals/snacks    Improve shortness of breath with ADL's Yes    Intervention Provide education, individualized exercise plan and daily activity instruction to help decrease symptoms of SOB with activities of daily living.    Expected Outcomes Short Term: Improve cardiorespiratory fitness to achieve a reduction of symptoms when performing ADLs;Long Term: Be able to perform more ADLs without symptoms or delay the onset of symptoms    Hypertension Yes    Intervention Provide education on lifestyle modifcations including regular physical activity/exercise, weight management, moderate sodium restriction and increased consumption of fresh fruit, vegetables, and low fat dairy, alcohol moderation, and smoking cessation.;Monitor prescription use compliance.    Expected Outcomes Short Term: Continued assessment and intervention until BP is < 140/73mm HG in hypertensive participants. < 130/55mm HG in hypertensive participants with diabetes, heart failure or chronic kidney disease.;Long Term: Maintenance of blood pressure at goal levels.    Lipids Yes    Intervention Provide education and support for participant on nutrition & aerobic/resistive exercise along with prescribed medications to achieve LDL 70mg , HDL >40mg .    Expected Outcomes Short Term:  Participant states understanding of desired cholesterol values and is compliant with medications prescribed. Participant is following exercise prescription and nutrition guidelines.;Long Term: Cholesterol controlled with medications as prescribed, with individualized exercise RX and with personalized nutrition plan. Value goals: LDL < 70mg , HDL > 40 mg.             Education:Diabetes - Individual verbal and written instruction to review signs/symptoms of diabetes, desired ranges of glucose level fasting, after meals and with exercise. Acknowledge that pre and post exercise glucose checks will be done for 3 sessions at entry of program.   Know Your Numbers and Heart Failure: - Group verbal and visual instruction to discuss disease risk factors for cardiac and pulmonary disease and treatment options.  Reviews associated critical values for Overweight/Obesity, Hypertension, Cholesterol, and Diabetes.  Discusses basics of heart failure: signs/symptoms and treatments.  Introduces Heart Failure Zone chart for action plan for heart failure.  Written material given at graduation.   Core Components/Risk Factors/Patient Goals Review:   Goals and Risk Factor Review     Row Name 10/27/22 1041 11/24/22 1002 12/20/22 1012 01/31/23 0959       Core Components/Risk Factors/Patient Goals Review   Personal Goals Review Weight Management/Obesity;Increase knowledge of respiratory medications and ability to use respiratory devices properly.;Hypertension;Improve shortness of breath with ADL's Weight Management/Obesity;Increase knowledge of respiratory medications and ability to use respiratory devices properly.;Hypertension;Improve shortness of breath with ADL's Improve shortness of breath with ADL's Improve shortness of breath with ADL's;Hypertension;Weight Management/Obesity;Lipids    Review Stuart Harvey is doing well in rehab.  His weight is steady.  Pressures are doing well, they have always been on the lower end of  normal. His breathing is doing okay.  It has gotten better since starting.  He is doing well with his meds. Stuart Harvey is doing well in rehab.  He has been working with his  doctor to balance out his pressures.  They decreased his metoprolol and it seems to be helping some.  He still feels like it is running low but has not had as many dizzy spells.  He is still having some SOB but it is getting better.  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 His weight is still steady. Spoke to patient about their shortness of breath and what they can do to improve. Patient has been informed of breathing techniques when starting the program. Patient is informed to tell staff if they have had any med changes and that certain meds they are taking or not taking can be causing shortness of breath. Stuart Harvey reports having a cold with fever recently so he hasn't done much recently. Stuart Harvey reports that aside from being sick his shortness of breath has been getting better. Stuart Harvey reports checking his BP at home 110-130/70-80s. He reports taking his medication as prescribed with no issues at this time. Stuart Harvey reports his weight has been stable. Stuart Harvey recently got labwork done in february where his A1C was 6.1 and his lipid panel came back normal.    Expected Outcomes SHort: Conitnue to keep close eye on BP to not get too low Long; Conitnue to montior risk factors Short: Conitnue to monitor BP closely Long; Continue to monitor risk factors. Short: Attend [facility] regularly to improve shortness of breath with ADL's. Long: maintain independence with ADL's Short: Attend [facility] regularly to improve shortness of breath with ADL's. Long: maintain independence with ADL's, manage risk factors independently             Core Components/Risk Factors/Patient Goals at Discharge (Final Review):   Goals and Risk Factor Review - 01/31/23 0959       Core Components/Risk Factors/Patient Goals Review   Personal Goals Review  Improve shortness of breath with ADL's;Hypertension;Weight Management/Obesity;Lipids    Review Stuart Harvey reports having a cold with fever recently so he hasn't done much recently. Stuart Harvey reports that aside from being sick his shortness of breath has been getting better. Stuart Harvey reports checking his BP at home 110-130/70-80s. He reports taking his medication as prescribed with no issues at this time. Stuart Harvey reports his weight has been stable. Stuart Harvey recently got labwork done in february where his A1C was 6.1 and his lipid panel came back normal.    Expected Outcomes Short: Attend [facility] regularly to improve shortness of breath with ADL's. Long: maintain independence with ADL's, manage risk factors independently             ITP Comments:  ITP Comments     Row Name 09/12/22 1111 09/22/22 1105 09/27/22 1041 10/12/22 0818 11/09/22 1031   ITP Comments Virtual Visit completed. Patient informed on EP and RD appointment and 6 Minute walk test. Patient also informed of patient health questionnaires on My Chart. Patient Verbalizes understanding. Visit diagnosis can be found in [facility] 08/26/2022. Completed 6MWT and gym orientation. Initial ITP created and sent for review to Dr. Emily Filbert, Medical Director. First full day of exercise!  Patient was oriented to gym and equipment including functions, settings, policies, and procedures.  Patient's individual exercise prescription and treatment plan were reviewed.  All starting workloads were established based on the results of the 6 minute walk test done at initial orientation visit.  The plan for exercise progression was also introduced and progression will be customized based on patient's performance and goals. 30 Day review completed. Medical Director ITP review done, changes made  as directed, and signed approval by Market researcher.   New to program 30 Day review completed. Medical Director ITP review done, changes made as directed, and signed approval by Medical  Director.    [place] Name 12/07/22 [id number] 01/04/23 1419 02/01/23 0912       ITP Comments 30 Day review completed. Medical Director ITP review done, changes made as directed, and signed approval by Medical Director. 30 day review completed. ITP sent to Dr. Zetta Bills, Medical Director of  Pulmonary Rehab. Continue with ITP unless changes are made by physician. 30 Day review completed. Medical Director ITP review done, changes made as directed, and signed approval by Medical Director.              Comments:

## 2023-02-02 ENCOUNTER — Encounter: Payer: Medicare HMO | Admitting: *Deleted

## 2023-02-02 DIAGNOSIS — I5022 Chronic systolic (congestive) heart failure: Secondary | ICD-10-CM

## 2023-02-02 NOTE — Progress Notes (Signed)
Daily Session Note  Patient Details  Name: HERACLIO FICKLE MRN: [id number] Date of Birth: April 18, 1951 Referring Provider:   [name] Pulmonary Rehab from 09/22/2022 in [facility] Cardiac and Pulmonary Rehab  Referring Provider [name], MD       Encounter Date: 02/02/2023  Check In:  Session Check In - 02/02/23 1019       Check-In   Supervising physician immediately available to respond to emergencies See telemetry face sheet for immediately available ER MD    Location [facility]-Cardiac & Pulmonary Rehab    Staff Present Larna Daughters, MS, ACSM CEP, Exercise Physiologist;Joseph Wever, RCP,RRT,BSRT;Miara Emminger Frederico Hamman RN, Odelia Gage, RN, ADN    Virtual Visit No    Medication changes reported     No    Fall or balance concerns reported    No    Tobacco Cessation No Change    Warm-up and Cool-down Performed on first and last piece of equipment    Resistance Training Performed Yes    VAD Patient? No    PAD/SET Patient? No      Pain Assessment   Currently in Pain? No/denies                Social History   Tobacco Use  Smoking Status Former   Packs/day: 1.00   Years: 35.00   Additional pack years: 0.00   Total pack years: 35.00   Types: Cigarettes   Quit date: 12/16/2005   Years since quitting: 17.1   Passive exposure: Past  Smokeless Tobacco Current   Types: Chew  Tobacco Comments   pt states he very rarely uses smokeless tobacco     Goals Met:  Independence with exercise equipment Exercise tolerated well No report of concerns or symptoms today Strength training completed today  Goals Unmet:  Not Applicable  Comments: Pt able to follow exercise prescription today without complaint.  Will continue to monitor for progression.     Dr. Emily Filbert is Medical Director for [place].  Dr. Ottie Glazier is Medical Director for [facility] Pulmonary Rehabilitation.

## 2023-02-07 ENCOUNTER — Encounter: Payer: Medicare HMO | Admitting: *Deleted

## 2023-02-07 DIAGNOSIS — I5022 Chronic systolic (congestive) heart failure: Secondary | ICD-10-CM

## 2023-02-07 NOTE — Progress Notes (Signed)
Daily Session Note  Patient Details  Name: KARAS MACHNIK MRN: [id number] Date of Birth: [birth year] Referring Provider:   [name] Pulmonary Rehab from 09/22/2022 in [facility] Cardiac and Pulmonary Rehab  Referring Provider [name], MD       Encounter Date: 02/07/2023  Check In:  Session Check In - 02/07/23 1037       Check-In   Supervising physician immediately available to respond to emergencies See telemetry face sheet for immediately available ER MD    Location [facility]-Cardiac & Pulmonary Rehab    Staff Present Heath Lark, RN, BSN, CCRP;Jessica Denver, MA, RCEP, CCRP, Bertram Gala, MS, ACSM CEP, Exercise Physiologist    Virtual Visit No    Medication changes reported     No    Fall or balance concerns reported    No    Warm-up and Cool-down Performed on first and last piece of equipment    Resistance Training Performed Yes    VAD Patient? No    PAD/SET Patient? No      Pain Assessment   Currently in Pain? No/denies                Social History   Tobacco Use  Smoking Status Former   Packs/day: 1.00   Years: 35.00   Additional pack years: 0.00   Total pack years: 35.00   Types: Cigarettes   Quit date: 12/16/2005   Years since quitting: 17.1   Passive exposure: Past  Smokeless Tobacco Current   Types: Chew  Tobacco Comments   pt states he very rarely uses smokeless tobacco     Goals Met:  Proper associated with RPD/PD & O2 Sat Independence with exercise equipment Exercise tolerated well No report of concerns or symptoms today  Goals Unmet:  Not Applicable  Comments: Pt able to follow exercise prescription today without complaint.  Will continue to monitor for progression.    Dr. Emily Filbert is Medical Director for [place].  Dr. Ottie Glazier is Medical Director for [facility] Pulmonary Rehabilitation.

## 2023-02-09 ENCOUNTER — Encounter: Payer: Medicare HMO | Admitting: *Deleted

## 2023-02-09 DIAGNOSIS — I5022 Chronic systolic (congestive) heart failure: Secondary | ICD-10-CM | POA: Diagnosis not present

## 2023-02-09 NOTE — Progress Notes (Signed)
Daily Session Note  Patient Details  Name: Stuart Harvey MRN: [id number] Date of Birth: 02-07-1951 Referring Provider:   Flowsheet Row Pulmonary Rehab from 09/22/2022 in [facility] Cardiac and Pulmonary Rehab  Referring Provider [name], MD       Encounter Date: 02/09/2023  Check In:  Session Check In - 02/09/23 1045       Check-In   Supervising physician immediately available to respond to emergencies See telemetry face sheet for immediately available ER MD    Location [facility]-Cardiac & Pulmonary Rehab    Staff Present Darlyne Russian, RN, Lorin Mercy, MS, ACSM CEP, Exercise Physiologist;Joseph Tessie Fass, [place]    Virtual Visit No    Medication changes reported     No    Fall or balance concerns reported    No    Warm-up and Cool-down Performed on first and last piece of equipment    Resistance Training Performed Yes    VAD Patient? No    PAD/SET Patient? No      Pain Assessment   Currently in Pain? No/denies                Social History   Tobacco Use  Smoking Status Former   Packs/day: 1.00   Years: 35.00   Additional pack years: 0.00   Total pack years: 35.00   Types: Cigarettes   Quit date: 12/16/2005   Years since quitting: 17.1   Passive exposure: Past  Smokeless Tobacco Current   Types: Chew  Tobacco Comments   pt states he very rarely uses smokeless tobacco     Goals Met:  Independence with exercise equipment Exercise tolerated well No report of concerns or symptoms today Strength training completed today  Goals Unmet:  Not Applicable  Comments: Pt able to follow exercise prescription today without complaint.  Will continue to monitor for progression.    Dr. Emily Filbert is Medical Director for [place].  Dr. Ottie Glazier is Medical Director for [facility] Pulmonary Rehabilitation.

## 2023-02-14 ENCOUNTER — Encounter: Payer: Medicare HMO | Attending: Cardiovascular Disease | Admitting: *Deleted

## 2023-02-14 DIAGNOSIS — I5022 Chronic systolic (congestive) heart failure: Secondary | ICD-10-CM | POA: Diagnosis present

## 2023-02-14 NOTE — Progress Notes (Signed)
Daily Session Note  Patient Details  Name: [facility] LONGS MRN: [id number] Date of Birth: 01/01/1951 Referring Provider:   [name] Pulmonary Rehab from 09/22/2022 in [facility] Cardiac and Pulmonary Rehab  Referring Provider [name], MD       Encounter Date: 02/14/2023  Check In:  Session Check In - 02/14/23 1001       Check-In   Supervising physician immediately available to respond to emergencies See telemetry face sheet for immediately available ER MD    Location [facility]-Cardiac & Pulmonary Rehab    Staff Present Renita Papa, RN BSN;Melissa Caiola MS, RDN, Carles Collet, MS, ACSM CEP, Exercise Physiologist;Other   Gretchen Short BA, Exercise Science   Virtual Visit No    Medication changes reported     No    Fall or balance concerns reported    No    Warm-up and Cool-down Performed on first and last piece of equipment    Resistance Training Performed Yes    VAD Patient? No    PAD/SET Patient? No      Pain Assessment   Currently in Pain? No/denies                Social History   Tobacco Use  Smoking Status Former   Packs/day: 1.00   Years: 35.00   Additional pack years: 0.00   Total pack years: 35.00   Types: Cigarettes   Quit date: 12/16/2005   Years since quitting: 17.1   Passive exposure: Past  Smokeless Tobacco Current   Types: Chew  Tobacco Comments   pt states he very rarely uses smokeless tobacco     Goals Met:  Independence with exercise equipment Exercise tolerated well No report of concerns or symptoms today Strength training completed today  Goals Unmet:  Not Applicable  Comments: Pt able to follow exercise prescription today without complaint.  Will continue to monitor for progression.    Dr. Emily Filbert is Medical Director for [place].  Dr. Ottie Glazier is Medical Director for [facility] Pulmonary Rehabilitation.

## 2023-02-16 ENCOUNTER — Encounter: Payer: Medicare HMO | Admitting: *Deleted

## 2023-02-16 DIAGNOSIS — I5022 Chronic systolic (congestive) heart failure: Secondary | ICD-10-CM

## 2023-02-16 NOTE — Progress Notes (Signed)
Daily Session Note  Patient Details  Name: Stuart Harvey MRN: [id number] Date of Birth: [birth year] Referring Provider:   Flowsheet Row Pulmonary Rehab from 09/22/2022 in [facility] Cardiac and Pulmonary Rehab  Referring Provider [name], MD       Encounter Date: 02/16/2023  Check In:  Session Check In - 02/16/23 1001       Check-In   Supervising physician immediately available to respond to emergencies See telemetry face sheet for immediately available ER MD    Location [facility]-Cardiac & Pulmonary Rehab    Staff Present Darlyne Russian, RN, Lorin Mercy, MS, ACSM CEP, Exercise Physiologist;Joseph Tessie Fass, [place]    Virtual Visit No    Medication changes reported     No    Fall or balance concerns reported    No    Warm-up and Cool-down Performed on first and last piece of equipment    Resistance Training Performed Yes    VAD Patient? No    PAD/SET Patient? No      Pain Assessment   Currently in Pain? No/denies                Social History   Tobacco Use  Smoking Status Former   Packs/day: 1.00   Years: 35.00   Additional pack years: 0.00   Total pack years: 35.00   Types: Cigarettes   Quit date: 12/16/2005   Years since quitting: 17.1   Passive exposure: Past  Smokeless Tobacco Current   Types: Chew  Tobacco Comments   pt states he very rarely uses smokeless tobacco     Goals Met:  Independence with exercise equipment Exercise tolerated well No report of concerns or symptoms today Strength training completed today  Goals Unmet:  Not Applicable  Comments: Pt able to follow exercise prescription today without complaint.  Will continue to monitor for progression.    Dr. Emily Filbert is Medical Director for [place].  Dr. Ottie Glazier is Medical Director for [facility] Pulmonary Rehabilitation.

## 2023-02-17 ENCOUNTER — Ambulatory Visit: Payer: Medicare HMO | Attending: Physician Assistant | Admitting: Physician Assistant

## 2023-02-17 ENCOUNTER — Encounter: Payer: Self-pay | Admitting: Physician Assistant

## 2023-02-17 VITALS — BP 98/64 | HR 81 | Ht 71.0 in | Wt 212.2 lb

## 2023-02-17 DIAGNOSIS — I714 Abdominal aortic aneurysm, without rupture, unspecified: Secondary | ICD-10-CM

## 2023-02-17 DIAGNOSIS — I5020 Unspecified systolic (congestive) heart failure: Secondary | ICD-10-CM | POA: Diagnosis not present

## 2023-02-17 DIAGNOSIS — I2510 Atherosclerotic heart disease of native coronary artery without angina pectoris: Secondary | ICD-10-CM

## 2023-02-17 DIAGNOSIS — I77810 Thoracic aortic ectasia: Secondary | ICD-10-CM

## 2023-02-17 DIAGNOSIS — E785 Hyperlipidemia, unspecified: Secondary | ICD-10-CM

## 2023-02-17 DIAGNOSIS — Z951 Presence of aortocoronary bypass graft: Secondary | ICD-10-CM | POA: Diagnosis not present

## 2023-02-17 DIAGNOSIS — I480 Paroxysmal atrial fibrillation: Secondary | ICD-10-CM

## 2023-02-17 DIAGNOSIS — Z9889 Other specified postprocedural states: Secondary | ICD-10-CM

## 2023-02-17 DIAGNOSIS — I255 Ischemic cardiomyopathy: Secondary | ICD-10-CM

## 2023-02-17 DIAGNOSIS — I10 Essential (primary) hypertension: Secondary | ICD-10-CM

## 2023-02-17 DIAGNOSIS — I723 Aneurysm of iliac artery: Secondary | ICD-10-CM

## 2023-02-17 DIAGNOSIS — Z8679 Personal history of other diseases of the circulatory system: Secondary | ICD-10-CM

## 2023-02-17 NOTE — Patient Instructions (Signed)
Medication Instructions:  Your physician recommends that you continue on your current medications as directed. Please refer to the Current Medication list given to you today.  *If you need a refill on your cardiac medications before your next appointment, please call your pharmacy*   Lab Work:  No lab work ordered today  If you have labs (blood work) drawn today and your tests are completely normal, you will receive your results only by: MyChart Message (if you have MyChart) OR A paper copy in the mail If you have any lab test that is abnormal or we need to change your treatment, we will call you to review the results.   Testing/Procedures:  No testing ordered today   Follow-Up: At [facility], you and your health needs are our priority.  As part of our continuing mission to provide you with exceptional heart care, we have created designated Provider Care Teams.  These Care Teams include your primary Cardiologist (physician) and Advanced Practice Providers (APPs -  Physician Assistants and Nurse Practitioners) who all work together to provide you with the care you need, when you need it.  We recommend signing up for the patient portal called "MyChart".  Sign up information is provided on this After Visit Summary.  MyChart is used to connect with patients for Virtual Visits (Telemedicine).  Patients are able to view lab/test results, encounter notes, upcoming appointments, etc.  Non-urgent messages can be sent to your provider as well.   To learn more about what you can do with MyChart, go to [email or web address].    Your next appointment:   6 month(s)  Provider:   You may see [name], MD or one of the following Advanced Practice Providers on your designated Care Team:   Nicolasa Ducking, NP Eula Listen, PA-C Cadence Fransico Michael, PA-C Charlsie Quest, NP

## 2023-02-17 NOTE — Progress Notes (Signed)
Cardiology Office Note    Date:  02/17/2023   ID:  Stuart GrimesMarshall A Harvey, DOB 16-Oct-1951, MRN [id number]  PCP:  [name], [name], NP  Cardiologist:  Lorine BearsMuhammad Arida, MD  Electrophysiologist:  None   Chief Complaint: Follow-up  History of Present Illness:   Stuart Harvey is a 72 y.o. male with history of CAD s/p 4[facility]vessel [facility] 06/[facility], HFrEF, large infrarenal AAA s/p open surgical repair in 08/2007, right iliac artery aneurysm status post endovascular repair in 07/2021, PAF on Eliquis, remote CVA, AKI, HTN, HLD, hypothyroidism, anemia, depression, and GERD who presents for follow-up of his CAD and A-fib.   He was hospitalized in 05/2018 with unresponsiveness.  He was reportedly down for 1 hour with possible seizure-like activities.  He had numerous contusions.  Per EMS, he was in ventricular tachycardia upon arrival and was cardioverted.  He was briefly intubated and found to have mildly elevated troponin.  CPK was close to 4000.  Echo showed an EF of 50 to 55%.  LHC showed severe underlying three-vessel CAD with patent grafts including LIMA to LAD, SVG to RCA, SVG to diagonal, and SVG to OM3.  There was borderline disease in the SVG to OM, though that was left to be treated medically given it was supplying a small territory.  While admitted, he developed A-fib and converted spontaneously.  His presentation was felt to be triggered by heat stroke.   He was seen in the office in 06/2022 with worsening exertional dyspnea and occasional wheezing as well as some episodes of left-sided chest discomfort.  Subsequent Lexiscan MPI on 06/23/2022 was without evidence of ischemia with prior evidence of infarct as outlined below.  LVEF was moderately reduced with a calculated EF of 38%.  Overall, this study showed findings were consistent with prior MI and was intermediate risk.  Subsequent echo on 07/20/2022 demonstrated an EF of 35 to 40%, global hypokinesis, mild LVH, grade 1 diastolic dysfunction, low normal  RV systolic function, trivial mitral regurgitation, aortic valve sclerosis without evidence of stenosis, trivial aortic insufficiency, mildly dilated aortic root measuring 43 mm, mildly dilated ascending aorta measuring 42 mm, and an estimated right atrial pressure of 3 mmHg.  Given symptoms and the above abnormalities noted on workup, he underwent R/LHC on 08/05/2022 which showed significant three-vessel CAD with patent grafts including LIMA to LAD, SVG to diagonal, and SVG to RPDA.  There was new occlusion of the SVG to OM when compared to most recent cardiac cath.  However, this was not unexpected given the graft was diffusely diseased.  RHC showed normal right and left-sided filling pressures, normal pulmonary pressures, and normal cardiac output.  Medical therapy and GDMT was recommended.  In follow-up on 08/18/2022, he had stable shortness of breath.  He was a started on Farxiga to further optimize GDMT.  With mild renal dysfunction noted on subsequent labs, he was referred to nephrology.   He was seen on 10/14/2022 and was without symptoms of angina or decompensation with stable chronic dyspnea and fatigue.  With previously noted AKI, Marcelline DeistFarxiga was discontinued and he was initiated on Entresto.  We were notified by pulmonary rehab he had some episodes of hypotension in the 80s to 90s systolic.  In this setting, Toprol-XL was reduced to 12.5 mg daily.  He was last seen in the office in 11/2022 and remained without symptoms of angina or cardiac decompensation.  Following decreasing Toprol-XL, he noted improvement in dizziness.  He did report an episode  of bilateral upper extremity weakness and dysarthria on 11/02/2022 with symptoms lasting for 1 minute to 1.5 minutes and spontaneously resolved.  He did not seek care at that time.  He had been without further symptoms.  Given this, we obtained MRI of the brain which showed no evidence of acute intracranial abnormality with a chronic lacunar infarct within the  posterior right frontal lobe as well as chronic small vessel ischemic disease.  To further evaluate his cardiomyopathy, he underwent echo on 01/18/2023 showed an EF of 35 to 40%, global hypokinesis, mild LVH, grade 1 diastolic dysfunction, normal RV systolic function with moderately large ventricular cavity size, mild biatrial enlargement, mild mitral regurgitation, mild aortic insufficiency, aortic valve sclerosis without evidence of stenosis, mild dilatation of the aortic root and ascending aorta measuring 40 mm, and an estimated right atrial pressure of 3 mmHg.  He comes in accompanied by his wife today and is doing very well from a cardiac perspective, without symptoms of angina or cardiac decompensation.  He notes an improvement in his respiratory status and stamina with pulmonary rehab.  His weight is down 4 pounds when compared to his last clinic visit.  No palpitations, presyncope, or syncope.  He does note some mild intermittent dizziness that is stable.  He also notes a longstanding history of some right gluteal discomfort with ambulation on a treadmill or hard surface.  No symptoms of claudication.  No nonhealing wounds.  No symptoms concerning for bleeding.  No falls.  No further symptoms of upper extremity weakness or dysarthria.  Overall patient and wife feel like he is doing very well from a cardiac perspective.   Labs independently reviewed: 12/2022 - A1c 6.1, TSH normal, free T4 normal, TC 146, TG 99, HDL 52, LDL 76 10/2022 - BUN 29, serum creatinine 1.41, potassium 4.7, albumin 4.2, Hgb 14.6, PLT 174 09/2022 - Hgb 14.8, PLT 156 08/2022 - potassium 4.8, BUN 31, serum creatinine 1.71 03/2022 - AST/ALT normal   Past Medical History:  Diagnosis Date   AAA (abdominal aortic aneurysm)    a.) s/p EVAR in 08/2007 by Dr. Earnestine Leys. b.) CTA on 06/30/2021 --> interval aneurysmal dilitation superior to stent graft with the juxtarenal aorta measuring 3.8 cm.   Anemia    Arthritis    both knees    Chronic anticoagulation    Apixaban   CKD (chronic kidney disease), stage II    Coronary artery disease    a.) 4v CABG @ [facility] 06/2007 (LIMA-LAD, VG-RPDA, VG-OM3,VG-D2); b.) LHC 2011: patent grafts EF 40%, c.) LHC 06/05/18: LM nl, pLAD 50%, p-mLAD 50%, ostD1 70%, ost ramus 30%, ost-pLCx 40%, pLCx 100% (chronically occluded), OM3 100%, p-dRCA 100% (chronically occluded), LIMA-LAD patent, VG-RPDA patent, VG-OM3 mid-graft 60% and 80%   Depression    GERD (gastroesophageal reflux disease)    Hernia of abdominal cavity    History of 2019 novel coronavirus disease (COVID-19) 09/2019   Hyperlipidemia    Hypertension    Hypothyroidism    Iliac artery aneurysm, right    a.) at the distal landing zone of the right iliac limb measuring 3 cm   Right middle lobe pulmonary nodule 06/30/2021   a.) measured 8 mm by CT on 06/30/2021.   S/P CABG x 4 06/25/2007   a.) LIMA-LAD, SVG-RPDA, SVG-OM3, SVG-D2   Sigmoid diverticulosis    Ventricular tachycardia 05/2018   a.) found unresponsive by EMS; VT noted --> defibrillation achieved ROSC. Briefly intubated. Episode felt to be secondary to heat stoke.  Past Surgical History:  Procedure Laterality Date   ABDOMINAL AORTIC ANEURYSM REPAIR N/A 08/29/2007   Procedure: EVAR; Location: [facility]; Surgeon: Brynda Greathouse, MD   ABDOMINAL AORTIC ANEURYSM REPAIR N/A 12/05/2012   Procedure: Abdominal aortic aneurysm of approximately 6 cm in maximal diameter status post previous endovascular repair with type I and III endoleaks; Location: [facility]; Surgeon: Festus Barren, MD   CARDIAC CATHETERIZATION Left 06/28/2010   3v CAD with patent CABG grafts; LVEF 40%; stable aortic stent graft; Location: [facility]; Surgeon: Arnoldo Hooker, MD   CARDIAC CATHETERIZATION Left 06/18/2007   3v CAD; LVEF 50%; refer to CVTS for CABG; Location: [facility]; Surgeon: [name], MD   COLONOSCOPY WITH PROPOFOL N/A 11/17/2021   Procedure: COLONOSCOPY WITH PROPOFOL;  Surgeon: Toney Reil, MD;  Location:  [facility] ENDOSCOPY;  Service: Gastroenterology;  Laterality: N/A;   CORONARY ARTERY BYPASS GRAFT N/A 06/25/2007   Procedure: 4v CABG (LIMA-LAD, SVG-RPDA, SVG-OM3, SVG-D2); Location: [facility]; Surgeon: Marshell Garfinkel, MD   CORONARY/GRAFT ANGIOGRAPHY N/A 06/05/2018   Procedure: Rosalie Doctor ANGIOGRAPHY;  Surgeon: Yvonne Kendall, MD;  Location: [facility] INVASIVE CV LAB;  Service: Cardiovascular;  Laterality: N/A;   ENDOVASCULAR REPAIR/STENT GRAFT N/A 08/04/2021   Procedure: ENDOVASCULAR REPAIR/STENT GRAFT;  Surgeon: Annice Needy, MD;  Location: [facility] INVASIVE CV LAB;  Service: Cardiovascular;  Laterality: N/A;   EXPLORATORY LAPAROTOMY  1997   HERNIA REPAIR     KNEE ARTHROPLASTY Left 09/19/2016   Procedure: COMPUTER ASSISTED TOTAL KNEE ARTHROPLASTY;  Surgeon: Donato [name], MD;  Location: [facility] ORS;  Service: Orthopedics;  Laterality: Left;   RIGHT/LEFT HEART CATH AND CORONARY ANGIOGRAPHY N/A 08/05/2022   Procedure: RIGHT/LEFT HEART CATH AND CORONARY ANGIOGRAPHY;  Surgeon: Iran Ouch, MD;  Location: [facility] INVASIVE CV LAB;  Service: Cardiovascular;  Laterality: N/A;   TOTAL KNEE ARTHROPLASTY Right 09/2016    Current Medications: Current Meds  Medication Sig   apixaban (ELIQUIS) 5 MG TABS tablet Take 1 tablet (5 mg total) by mouth 2 (two) times daily.   buPROPion (WELLBUTRIN SR) 200 MG 12 hr tablet Take 1 tablet (200 mg total) by mouth 2 (two) times daily.   Cholecalciferol 50 MCG (2000 UT) CAPS Take 1 capsule by mouth daily.    citalopram (CELEXA) 40 MG tablet Take 1 tablet (40 mg total) by mouth daily.   ezetimibe (ZETIA) 10 MG tablet Take 1 tablet (10 mg total) by mouth daily.   levothyroxine (SYNTHROID) 75 MCG tablet Take 1 tablet (75 mcg total) by mouth daily.   metoprolol succinate (TOPROL XL) 25 MG 24 hr tablet Take 0.5 tablets (12.5 mg total) by mouth daily.   omeprazole (PRILOSEC) 20 MG capsule Take 1 capsule (20 mg total) by mouth daily.   rosuvastatin (CRESTOR) 40 MG tablet Take 1 tablet (40  mg total) by mouth daily.   sacubitril-valsartan (ENTRESTO) 24-26 MG Take 1 tablet by mouth 2 (two) times daily.    Allergies:   Benzodiazepines, Codeine, and Tetracycline   Social History   Socioeconomic History   Marital status: Married    Spouse name: Not on file   Number of children: Not on file   Years of education: 14   Highest education level: Some college, no degree  Occupational History   Occupation: retired  Tobacco Use   Smoking status: Former    Packs/day: 1.00    Years: 35.00    Additional pack years: 0.00    Total pack years: 35.00    Types: Cigarettes    Quit date: 12/16/2005  Years since quitting: 17.1    Passive exposure: Past   Smokeless tobacco: Current    Types: Chew   Tobacco comments:    pt states he very rarely uses smokeless tobacco   Vaping Use   Vaping Use: Never used  Substance and Sexual Activity   Alcohol use: No   Drug use: No   Sexual activity: Yes    Partners: Female  Other Topics Concern   Not on file  Social History Narrative   Not on file   Social Determinants of Health   Financial Resource Strain: Low Risk  (08/09/2022)   Overall Financial Resource Strain (CARDIA)    Difficulty of Paying Living Expenses: Not hard at all  Food Insecurity: No Food Insecurity (08/09/2022)   Hunger Vital Sign    Worried About Running Out of Food in the Last Year: Never true    Ran Out of Food in the Last Year: Never true  Transportation Needs: No Transportation Needs (08/09/2022)   PRAPARE - Administrator, Civil Service (Medical): No    Lack of Transportation (Non-Medical): No  Physical Activity: Insufficiently Active (08/09/2022)   Exercise Vital Sign    Days of Exercise per Week: 3 days    Minutes of Exercise per Session: 30 min  Stress: No Stress Concern Present (08/09/2022)   [employer] of Occupational Health - Occupational Stress Questionnaire    Feeling of Stress : Not at all  Social Connections: Socially Integrated  (08/09/2022)   Social Connection and Isolation Panel [NHANES]    Frequency of Communication with Friends and Family: More than three times a week    Frequency of Social Gatherings with Friends and Family: Once a week    Attends Religious Services: More than 4 times per year    Active Member of [employer] or Organizations: Yes    Attends Banker Meetings: Never    Marital Status: Married     Family History:  The patient's family history includes Depression in his maternal grandfather and maternal grandmother; Heart attack in his father, mother, and another family member; Heart disease in his mother and another family member; Hypertension in his father and mother; Kidney disease in his brother. There is no history of Prostate cancer, Kidney cancer, Stomach cancer, or Colon cancer.  ROS:   12-point review of systems is negative unless otherwise noted in the HPI.   EKGs/Labs/Other Studies Reviewed:    Studies reviewed were summarized above. The additional studies were reviewed today:  2D echo 01/18/2023: 1. Left ventricular ejection fraction, by estimation, is 35 to 40%. The  left ventricle has moderately decreased function. The left ventricle  demonstrates global hypokinesis. There is mild left ventricular  hypertrophy. Left ventricular diastolic  parameters are consistent with Grade I diastolic dysfunction (impaired  relaxation).   2. Right ventricular systolic function is normal. The right ventricular  size is moderately enlarged.   3. Left atrial size was mildly dilated.   4. Right atrial size was mildly dilated.   5. The mitral valve is normal in structure. Mild mitral valve  regurgitation.   6. The aortic valve is tricuspid. Aortic valve regurgitation is mild.  Aortic valve sclerosis/calcification is present, without any evidence of  aortic stenosis.   7. Aortic dilatation noted. There is mild dilatation of the aortic root  and of the ascending aorta, measuring 40 mm.    8. The inferior vena cava is normal in size with greater than 50%  respiratory variability,  suggesting right atrial pressure of 3 mmHg.  __________  [facility]:   Prox LAD lesion is 50% stenosed.   Prox LAD to Mid LAD lesion is 90% stenosed.   Ost Cx to Prox Cx lesion is 40% stenosed.   Prox Cx lesion is 100% stenosed.   Prox RCA to Dist RCA lesion is 100% stenosed.   Ost Ramus lesion is 30% stenosed.   Ost 1st Diag lesion is 70% stenosed.   3rd Mrg lesion is 100% stenosed.   Origin lesion is 100% stenosed.   LIMA and is normal in caliber.   SVG and is normal in caliber.   SVG.   The graft exhibits no disease.   The graft exhibits minimal luminal irregularities.   The graft exhibits mild .   1.  Significant three-vessel coronary artery disease with patent grafts including LIMA to LAD, SVG to diagonal and SVG to right PDA.  Occluded SVG to OM which is new compared to most recent cardiac catheterization.  However, this is not unexpected given that the graft was diffusely diseased. 2.  Left ventricular angiography was not performed.  EF was moderately reduced by echo. 3.  Right heart catheterization showed normal right and left-sided filling pressures, normal pulmonary pressures and normal cardiac output.   Recommendations: Continue medical therapy for coronary artery disease. Optimize heart failure treatment.  The patient is euvolemic. __________   2D echo 07/20/2022: 1. Left ventricular ejection fraction, by estimation, is 35 to 40%. The  left ventricle has moderately decreased function. The left ventricle  demonstrates global hypokinesis. There is mild left ventricular  hypertrophy. Left ventricular diastolic  parameters are consistent with Grade I diastolic dysfunction (impaired  relaxation).   2. Right ventricular systolic function is low normal. The right  ventricular size is not well visualized.   3. The mitral valve is normal in structure. Trivial mitral valve   regurgitation.   4. The aortic valve is tricuspid. Aortic valve regurgitation is trivial.  Aortic valve sclerosis is present, with no evidence of aortic valve  stenosis.   5. Aortic dilatation noted. There is mild dilatation of the aortic root,  measuring 43 mm. There is mild dilatation of the ascending aorta,  measuring 42 mm.   6. The inferior vena cava is normal in size with greater than 50%  respiratory variability, suggesting right atrial pressure of 3 mmHg. __________   Eugenie BirksLexiscan MPI 06/23/2022:   Findings are consistent with prior myocardial infarction. The study is intermediate risk.   No ST deviation was noted.   LV perfusion is abnormal. There is no evidence of ischemia. There is evidence of infarction. Defect 1: There is a medium defect with moderate reduction in uptake present in the apical to mid inferolateral location(s) that is fixed. There is abnormal wall motion in the defect area. Consistent with infarction. Defect 2: There is a medium defect with moderate reduction in uptake present in the basal inferior location(s) that is fixed. There is abnormal wall motion in the defect area. Consistent with infarction.   Left ventricular function is abnormal. Global function is moderately reduced.  Calculated ejection fraction is 38%. __________   LHC 05/2018: Conclusions: Severe native coronary artery disease with subtotal/total occlusions of the proximal/mid LAD, proximal LCx, and proximal RCA. Widely patent LIMA to LAD, SVG to diagonal, and SVG to RPDA. Patent but diffusely diseased SVG to OM3.  There are sequential focal 60% and 80% stenoses in the middle of the graft.  Intervention was not  attempted due to the patient's inability to remain still on the table and consistently follow commands, as well as recent acute kidney injury in the setting of rhabdomyolysis.  Additionally, this graft supplies a relatively small territory.   Recommendations: Continue medical therapy and  secondary prevention.  If platelets stabilize, recommend adding clopidogrel 75 mg daily to be continued at least 12 months. If patient has signs/symptoms of ischemia or recurrent ventricular tachycardia, consider PCI to SVG to OM3 once mental status improves.   Recommend uninterrupted dual antiplatelet therapy with Aspirin 81mg  daily and Clopidogrel 75mg  daily for a minimum of 12 months (ACS - Class I recommendation).  Clopidogrel will be added once platelet count has stabilized. __________   2D echo 05/2018: - Left ventricle: The cavity size was mildly dilated. Systolic   function was normal. The estimated ejection fraction was in the   range of 50% to 55%. Challenging images though grossly normal   Wall motion; there were grossly no significant regional wall   motion abnormalities. Left ventricular diastolic function   parameters were normal. - Ascending aorta: The ascending aorta was moderately dilated. 4.4   cm - Left atrium: The appendage was mildly to moderately dilated. - Right ventricle: Systolic function was normal. - Pulmonary arteries: PA peak pressure: 44 mm Hg (S).   EKG:  EKG is ordered today.  The EKG ordered today demonstrates NSR, 81 bpm, left anterior fascicular block, LVH with nonspecific ST-T changes, consistent with prior tracing  Recent Labs: 10/11/2022: ALT 17; Hemoglobin 14.8; Platelets 156 10/25/2022: BUN 29; Creatinine, Ser 1.37; Potassium 5.0; Sodium 138 01/11/2023: TSH 3.840  Recent Lipid Panel    Component Value Date/Time   CHOL 146 01/11/2023 1039   CHOL 159 12/18/2015 0827   TRIG 99 01/11/2023 1039   TRIG 166 (H) 12/18/2015 0827   HDL 52 01/11/2023 1039   CHOLHDL 2.9 03/29/2022 0913   CHOLHDL 3.4 09/25/2018 1208   VLDL 31 09/25/2018 1208   VLDL 33 (H) 12/18/2015 0827   LDLCALC 76 01/11/2023 1039    PHYSICAL EXAM:    VS:  BP 98/64 (BP Location: Left Arm, Patient Position: Sitting)   Pulse 81   Ht 5\' 11"  (1.803 m)   Wt 212 lb 3.2 oz (96.3 kg)    SpO2 98%   BMI 29.60 kg/m   BMI: Body mass index is 29.6 kg/m.  Physical Exam Vitals reviewed.  Constitutional:      Appearance: He is well-developed.  HENT:     Head: Normocephalic and atraumatic.  Eyes:     General:        Right eye: No discharge.        Left eye: No discharge.  Neck:     Vascular: No JVD.  Cardiovascular:     Rate and Rhythm: Normal rate and regular rhythm.     Heart sounds: Normal heart sounds, S1 normal and S2 normal. Heart sounds not distant. No midsystolic click and no opening snap. No murmur heard.    No friction rub.  Pulmonary:     Effort: Pulmonary effort is normal. No respiratory distress.     Breath sounds: Normal breath sounds. No decreased breath sounds, wheezing or rales.  Chest:     Chest wall: No tenderness.  Abdominal:     General: There is no distension.  Musculoskeletal:     Cervical back: Normal range of motion.     Right lower leg: No edema.     Left lower leg: No edema.  Skin:    General: Skin is warm and dry.     Nails: There is no clubbing.  Neurological:     Mental Status: He is alert and oriented to person, place, and time.  Psychiatric:        Speech: Speech normal.        Behavior: Behavior normal.        Thought Content: Thought content normal.        Judgment: Judgment normal.     Wt Readings from Last 3 Encounters:  02/17/23 212 lb 3.2 oz (96.3 kg)  01/11/23 212 lb 14.4 oz (96.6 kg)  11/16/22 216 lb 6 oz (98.1 kg)     ASSESSMENT & PLAN:   CAD status post CABG without angina: He is doing well without symptoms concerning for angina or cardiac decompensation.  Recent LHC with medical therapy as outlined above.  Continue aggressive risk factor modification and secondary prevention including apixaban in place of aspirin given underlying A-fib to minimize risk of bleeding, as well as rosuvastatin, ezetimibe, and metoprolol.  No indication for further ischemic testing at this time.  HFrEF secondary to ICM: He is  euvolemic and well compensated with NYHA class II-III symptoms.  Hypotension and underlying renal dysfunction have limited escalation of GDMT.  He remains on low-dose Toprol-XL and Entresto.  Defer addition of SGLT2 inhibitor given prior AKI noted with this.  Not currently on MRA secondary to underlying renal dysfunction.  Most recent echo demonstrated persistent cardiomyopathy.  QRS duration not wide enough for CRT.  Does not qualify for ICD with an EF of 40%.  Not requiring standing loop diuretic.  Optimize GDMT as able moving forward.  PAF: Maintaining sinus rhythm on Toprol-XL.  CHA2DS2-VASc at least 6.  He remains on apixaban 5 mg twice daily and does not meet reduced dosing criteria.  No symptoms concerning for bleeding or falls.  Recent labs showed stable renal function, electrolytes, and Hgb.  HTN: Blood pressure is on the soft side today.  He is asymptomatic.  Medical therapy as outlined above.  HLD: LDL 76.  He remains on rosuvastatin and ezetimibe.  Abdominal aortic aneurysm/iliac artery aneurysm: Last ultrasound 05/2022.  Followed by vascular surgery.  Dilated aortic root/ascending aorta: Aortic valve tricuspid.  Echo from 01/2023 showed a mildly dilated aorta measuring 40 mm and was stable.  Monitor periodically.    Disposition: F/u with Dr. Kirke Corin or an APP in 6 months.   Medication Adjustments/Labs and Tests Ordered: Current medicines are reviewed at length with the patient today.  Concerns regarding medicines are outlined above. Medication changes, Labs and Tests ordered today are summarized above and listed in the Patient Instructions accessible in Encounters.   Signed, Eula Listen, PA-C 02/17/2023 12:26 PM     [facility] [address] [place], [place] [address] [phone]

## 2023-02-20 ENCOUNTER — Other Ambulatory Visit: Payer: Self-pay | Admitting: Nurse Practitioner

## 2023-02-21 ENCOUNTER — Encounter: Payer: Medicare HMO | Admitting: *Deleted

## 2023-02-21 VITALS — Ht 71.0 in | Wt 211.3 lb

## 2023-02-21 DIAGNOSIS — I5022 Chronic systolic (congestive) heart failure: Secondary | ICD-10-CM

## 2023-02-21 NOTE — Telephone Encounter (Signed)
Requested medication (s) are due for refill today: yes  Requested medication (s) are on the active medication list: yes  Last refill:  zetia 09/19/22 #90 0 refills, synthroid 03/04/22 #90 0 refills  Future visit scheduled: yes in 4 months  Notes to clinic:  zetia no more refills , synthroid last ordered 03/04/22  by Loura Pardon, MD. Do you want to order Rxs?     Requested Prescriptions  Pending Prescriptions Disp Refills   omeprazole (PRILOSEC) 20 MG capsule [Pharmacy Med Name: OMEPRAZOLE DR 20 MG CAPSULE] 90 capsule 0    Sig: Take 1 capsule (20 mg total) by mouth daily.     Gastroenterology: Proton Pump Inhibitors Passed - 02/20/2023 11:38 AM      Passed - Valid encounter within last 12 months    Recent Outpatient Visits           1 month ago Benign hypertension with CKD (chronic kidney disease) stage III (HCC)   [place] [facility] Edmundson, Corrie Dandy T, NP   4 months ago Breast pain, left   [place] [facility] Gabriel Cirri, NP   10 months ago Hyperlipidemia, unspecified hyperlipidemia type   [place] [facility] Vigg, Avanti, MD   1 year ago Screen for colon cancer   [place] [facility] Vigg, Avanti, MD   1 year ago Screening for colon cancer   [place] [facility] Vigg, Avanti, MD       Future Appointments             In 4 months Cannady, Dorie Rank, NP [place] [facility]             levothyroxine (SYNTHROID) 75 MCG tablet [Pharmacy Med Name: LEVOTHYROXINE 75 MCG TABLET] 90 tablet 0    Sig: Take 1 tablet (75 mcg total) by mouth daily.     Endocrinology:  Hypothyroid Agents Passed - 02/20/2023 11:38 AM      Passed - TSH in normal range and within 360 days    TSH  Date Value Ref Range Status  01/11/2023 3.840 0.450 - 4.500 uIU/mL Final         Passed - Valid encounter within last 12 months    Recent Outpatient Visits           1 month ago Benign hypertension  with CKD (chronic kidney disease) stage III (HCC)   [place] [facility] Deaver, Corrie Dandy T, NP   4 months ago Breast pain, left   [place] [facility] Gabriel Cirri, NP   10 months ago Hyperlipidemia, unspecified hyperlipidemia type   [place] [facility] Vigg, Avanti, MD   1 year ago Screen for colon cancer   [place] [facility] Vigg, Avanti, MD   1 year ago Screening for colon cancer   [place] [facility] Vigg, Avanti, MD       Future Appointments             In 4 months Cannady, Odenton T, NP  [facility]            Signed Prescriptions Disp Refills   ezetimibe (ZETIA) 10 MG tablet 30 tablet 0    Sig: Take 1 tablet (10 mg total) by mouth daily.     Cardiovascular:  Antilipid - Sterol Transport Inhibitors Failed - 02/20/2023 11:38 AM      Failed - Lipid Panel in normal range  within the last 12 months    Cholesterol, Total  Date Value Ref Range Status  01/11/2023 146 100 - 199 mg/dL Final   Cholesterol Piccolo, [place]  Date Value Ref Range Status  12/18/2015 159 <200 mg/dL Final    Comment:                            Desirable                <200                         Borderline High      200- 239                         High                     >239    LDL Chol Calc (NIH)  Date Value Ref Range Status  01/11/2023 76 0 - 99 mg/dL Final   HDL  Date Value Ref Range Status  01/11/2023 52 >39 mg/dL Final   Triglycerides  Date Value Ref Range Status  01/11/2023 99 0 - 149 mg/dL Final   Triglycerides Piccolo,Waived  Date Value Ref Range Status  12/18/2015 166 (H) <150 mg/dL Final    Comment:                            Normal                   <150                         Borderline High     150 - 199                         High                200 - 499                         Very High                >499          Passed - AST in normal range and  within 360 days    AST  Date Value Ref Range Status  10/11/2022 31 0 - 40 IU/L Final   SGOT(AST)  Date Value Ref Range Status  12/13/2014 30 15 - 37 Unit/L Final         Passed - ALT in normal range and within 360 days    ALT  Date Value Ref Range Status  10/11/2022 17 0 - 44 IU/L Final   SGPT (ALT)  Date Value Ref Range Status  12/13/2014 20 14 - 63 U/L Final         Passed - Patient is not pregnant      Passed - Valid encounter within last 12 months    Recent Outpatient Visits           1 month ago Benign hypertension with CKD (chronic kidney disease) stage III (HCC)   [place] [facility] [place], Corrie DandyJolene T, NP   4 months ago Breast pain, left   [place] [facility] [place] Cher[place],  NP   10 months ago Hyperlipidemia, unspecified hyperlipidemia type   [place] [facility] Vigg, Avanti, MD   1 year ago Screen for colon cancer   [place] [facility] Vigg, Avanti, MD   1 year ago Screening for colon cancer   [place] [facility] Vigg, Avanti, MD       Future Appointments             In 4 months Cannady, Dorie Rank, NP [place] [facility]

## 2023-02-21 NOTE — Patient Instructions (Addendum)
Discharge Patient Instructions  Patient Details  Name: Stuart Harvey MRN: [id number] Date of Birth: 01-22-1951 Referring Provider:  Marjie Skiff, NP   Number of Visits: 75  Reason for Discharge:  Patient reached a stable level of exercise. Patient independent in their exercise. Patient has met program and personal goals.  Smoking History:  Social History   Tobacco Use  Smoking Status Former   Packs/day: 1.00   Years: 35.00   Additional pack years: 0.00   Total pack years: 35.00   Types: Cigarettes   Quit date: 12/16/2005   Years since quitting: 17.1   Passive exposure: Past  Smokeless Tobacco Current   Types: Chew  Tobacco Comments   pt states he very rarely uses smokeless tobacco     Diagnosis:  No diagnosis found.  Initial Exercise Prescription:  Initial Exercise Prescription - 09/22/22 1300       Date of Initial Exercise RX and Referring Provider   Date 09/22/22    Referring Provider [name], MD      Oxygen   Maintain Oxygen Saturation 88% or higher      Treadmill   MPH 2    Grade 0.5    Minutes 15    METs 2.67      Recumbant Bike   Level 1    RPM 50    Watts 12    Minutes 15    METs 2.38      REL-XR   Level 1    Watts 12    Speed 50    Minutes 15    METs 2.38      Prescription Details   Frequency (times per week) 2    Duration Progress to 30 minutes of continuous aerobic without signs/symptoms of physical distress      Intensity   THRR 40-80% of Max Heartrate 99-134    Ratings of Perceived Exertion 11-13    Perceived Dyspnea 0-4      Progression   Progression Continue to progress workloads to maintain intensity without signs/symptoms of physical distress.      Resistance Training   Training Prescription Yes    Weight 4 lb    Reps 10-15             Discharge Exercise Prescription (Final Exercise Prescription Changes):  Exercise Prescription Changes - 02/13/23 1100       Response to Exercise   Blood Pressure  (Admit) 124/66    Blood Pressure (Exit) 102/62    Heart Rate (Admit) 85 bpm    Heart Rate (Exercise) 106 bpm    Heart Rate (Exit) 93 bpm    Oxygen Saturation (Admit) 99 %    Oxygen Saturation (Exercise) 96 %    Oxygen Saturation (Exit) 95 %    Rating of Perceived Exertion (Exercise) 12    Perceived Dyspnea (Exercise) 1    Symptoms SOB    Comments return back after being sick    Duration Continue with 30 min of aerobic exercise without signs/symptoms of physical distress.    Intensity THRR unchanged      Progression   Progression Continue to progress workloads to maintain intensity without signs/symptoms of physical distress.    Average METs 3.63      Resistance Training   Training Prescription Yes    Weight 6 lb    Reps 10-15      Interval Training   Interval Training No      Treadmill   MPH 2.2  Grade 1.5    Minutes 15    METs 3.14      Recumbant Bike   Level 5    Watts 47    Minutes 15    METs 3.55      REL-XR   Level 5    Minutes 15    METs 6.2      Home Exercise Plan   Plans to continue exercise at Home (comment)   walking   Frequency Add 3 additional days to program exercise sessions.    Initial Home Exercises Provided 11/01/22      Oxygen   Maintain Oxygen Saturation 88% or higher             Functional Capacity:  6 Minute Walk     Row Name 11/21/22 1338 02/21/23 1030       6 Minute Walk   Phase Initial  Completed on 09/22/2022 Discharge    Distance 1215 feet 1335 feet    Distance % Change -- 9.8 %    Distance Feet Change -- 120 ft    Walk Time 6 minutes 6 minutes    # of Rest Breaks 0 0    MPH 2.3 2.53    METS 2.38 2.85    RPE 11 13    Perceived Dyspnea  1 1    VO2 Peak 8.35 9.98    Symptoms Yes (comment) Yes (comment)    Comments Hip pain 4/10 Hip pain 6/10    Resting HR 66 bpm 87 bpm    Resting BP 102/60 124/60    Resting Oxygen Saturation  96 % 97 %    Exercise Oxygen Saturation  during 6 min walk 94 % 94 %    Max Ex. HR 88  bpm 102 bpm    Max Ex. BP 118/76 136/74    2 Minute Post BP 108/68 122/60      Interval HR   1 Minute HR -- 96    2 Minute HR -- 98    3 Minute HR -- 97    4 Minute HR -- 98    5 Minute HR -- 102    6 Minute HR -- 99    2 Minute Post HR -- 93    Interval Heart Rate? -- Yes      Interval Oxygen   Interval Oxygen? -- Yes    Baseline Oxygen Saturation % -- 97 %    1 Minute Oxygen Saturation % -- 94 %    1 Minute Liters of Oxygen -- 0 L  Room Air    2 Minute Oxygen Saturation % -- 94 %    2 Minute Liters of Oxygen -- 0 L    3 Minute Oxygen Saturation % -- 96 %    3 Minute Liters of Oxygen -- 0 L    4 Minute Oxygen Saturation % -- 96 %    4 Minute Liters of Oxygen -- 0 L    5 Minute Oxygen Saturation % -- 94 %    5 Minute Liters of Oxygen -- 0 L    6 Minute Oxygen Saturation % -- 96 %    6 Minute Liters of Oxygen -- 0 L    2 Minute Post Oxygen Saturation % -- 96 %    2 Minute Post Liters of Oxygen -- 0 L           Nutrition & Weight - Outcomes:  Pre Biometrics - 09/22/22 1334  Pre Biometrics   Height 5\' 11"  (1.803 m)    Weight 216 lb 3.2 oz (98.1 kg)    BMI (Calculated) 30.17    Single Leg Stand 5 seconds   R            Post Biometrics - 02/21/23 1033        Post  Biometrics   Height 5\' 11"  (1.803 m)    Weight 211 lb 4.8 oz (95.8 kg)    Waist Circumference 41 inches    Hip Circumference 41.5 inches    Waist to Hip Ratio 0.99 %    BMI (Calculated) 29.48    Single Leg Stand --   hips hurting too much today            Nutrition:  Nutrition Therapy & Goals - 09/22/22 1049       Nutrition Therapy   Diet Heart healthy, low Na, CKD stg 3 MNT    Drug/Food Interactions Statins/Certain Fruits    Protein (specify units) 85-95g    Fiber 30 grams    Whole Grain Foods 3 servings   Depending on labwork   Saturated Fats 16 max. grams    Fruits and Vegetables 8 servings/day    Sodium 1.5 grams      Personal Nutrition Goals   Nutrition Goal ST:  practice MyPlate guidelines, focus on variety of plant foods LT: limit saturated fat <16g/day, limit sodium intake <1.5g/day, eat at least 30g of fiber per day, alter potassium and phosphorus intake based of of labwork for CKD.    Comments 72 y.o. M admitted to pulmonary rehab for chronic HF. PMHx includes CAD, MI 2008, HLD, HTN, CKD stg 3, AAA s/p EVAR (2008), paroxysmal A-fib, HFrEF, hypothyroidism, current tobacco use (dip), arthritis, depression, GERD, diverticulosis. PSHx includes CABG 2008, EVAR 2008 and 2014, coronary/graft angiography 2019, endovascular/stent graft 2022, bilateral TKA 2017. Relevant medications includes bupropion, vit D3, citalopram, synthroid, melatonin, omeprazole, rosuvastatin. Last GFR 08/29/22 was 42. PYP Score: 56. Vegetables & Fruits 5/12. Breads, Grains & Cereals 5/12. Red & Processed Meat 7/12. Poultry 2/2. Fish & Shellfish 0/4. Beans, Nuts & Seeds 3/4. Milk & Dairy Foods 3/6. Toppings, Oils, Seasonings & Salt 17/20. Sweets, Snacks & Restaurant Food 7/14. Beverages 7/10. He reports watching sodium B: does not usually have breakfast L: low sodium ham/turkey on sandwich (white bread) D: pork chops, chicken, spaghetti, roast beef. he likes to have baked potatoes and white beans. He reports having red meat <2x/week. He rpeorts his wife will cook dinner. He reports his wife uses liquid plant oils. Drinks: 2 cups of coffee (2 sugars), water, sometimes sweet tea or pepsi (1-2x/day). He reports having salad every meal (cucumbers, lettuce, tomato, some cheese, thousand [place] (2 tbsp). He reports that his wife is a great support system for him and makes sure to watch what he eats. Discussed heart healthy eating and CKD stg 3 MNT.      Intervention Plan   Intervention Prescribe, educate and counsel regarding individualized specific dietary modifications aiming towards targeted core components such as weight, hypertension, lipid management, diabetes, heart failure and other  comorbidities.;Nutrition handout(s) given to patient.    Expected Outcomes Short Term Goal: Understand basic principles of dietary content, such as calories, fat, sodium, cholesterol and nutrients.;Short Term Goal: A plan has been developed with personal nutrition goals set during dietitian appointment.;Long Term Goal: Adherence to prescribed nutrition plan.           Goals reviewed with patient; copy given  to patient.

## 2023-02-21 NOTE — Telephone Encounter (Signed)
Requested by interface surescripts. Protocol met. Future visit in 4 months. Requested Prescriptions  Pending Prescriptions Disp Refills   omeprazole (PRILOSEC) 20 MG capsule [Pharmacy Med Name: OMEPRAZOLE DR 20 MG CAPSULE] 90 capsule 0    Sig: Take 1 capsule (20 mg total) by mouth daily.     Gastroenterology: Proton Pump Inhibitors Passed - 02/20/2023 11:38 AM      Passed - Valid encounter within last 12 months    Recent Outpatient Visits           1 month ago Benign hypertension with CKD (chronic kidney disease) stage III (HCC)   [place] [facility] [place], Corrie Dandy T, NP   4 months ago Breast pain, left   [place] [facility] Gabriel Cirri, NP   10 months ago Hyperlipidemia, unspecified hyperlipidemia type   [place] [facility] Vigg, Avanti, MD   1 year ago Screen for colon cancer   [place] [facility] Vigg, Avanti, MD   1 year ago Screening for colon cancer   [place] [facility] Vigg, Avanti, MD       Future Appointments             In 4 months Cannady, Dorie Rank, NP [place] [facility]             ezetimibe (ZETIA) 10 MG tablet [Pharmacy Med Name: EZETIMIBE 10 MG TABLET] 30 tablet 0    Sig: Take 1 tablet (10 mg total) by mouth daily.     Cardiovascular:  Antilipid - Sterol Transport Inhibitors Failed - 02/20/2023 11:38 AM      Failed - Lipid Panel in normal range within the last 12 months    Cholesterol, Total  Date Value Ref Range Status  01/11/2023 146 100 - 199 mg/dL Final   Cholesterol Piccolo, Waived  Date Value Ref Range Status  12/18/2015 159 <200 mg/dL Final    Comment:                            Desirable                <200                         Borderline High      200- 239                         High                     >239    LDL Chol Calc (NIH)  Date Value Ref Range Status  01/11/2023 76 0 - 99 mg/dL Final   HDL  Date Value Ref Range  Status  01/11/2023 52 >39 mg/dL Final   Triglycerides  Date Value Ref Range Status  01/11/2023 99 0 - 149 mg/dL Final   Triglycerides Piccolo,Waived  Date Value Ref Range Status  12/18/2015 166 (H) <150 mg/dL Final    Comment:                            Normal                   <150  Borderline High     150 - 199                         High                200 - 499                         Very High                >499          Passed - AST in normal range and within 360 days    AST  Date Value Ref Range Status  10/11/2022 31 0 - 40 IU/L Final   SGOT(AST)  Date Value Ref Range Status  12/13/2014 30 15 - 37 Unit/L Final         Passed - ALT in normal range and within 360 days    ALT  Date Value Ref Range Status  10/11/2022 17 0 - 44 IU/L Final   SGPT (ALT)  Date Value Ref Range Status  12/13/2014 20 14 - 63 U/L Final         Passed - Patient is not pregnant      Passed - Valid encounter within last 12 months    Recent Outpatient Visits           1 month ago Benign hypertension with CKD (chronic kidney disease) stage III (HCC)   [place] [facility] [place], [place]ene T, NP   4 months ago Breast pain, left   [place] [facility] Cirri[facility]icker[facility] Cheryl, NP   10 months ago Hyperlipidemia, unspecified hyperlipidemia type   [place] [facility] Vigg, Avanti, MD   1 year ago Screen for colon cancer   [place] [facility] Vigg, Avanti, MD   1 year ago Screening for colon cancer   [place] [facility] Avanti, MD       Future Appointments             In 4 months Cannady, [place]ol[place] T, NP [place] [facility]             levothyroxine (SYNTHROID) 75 MCG tablet [Pharmacy Med Name: LEVOTHYROXINE 75 MCG TABLET] 90 tablet 0    Sig: Take 1 tablet (75 mcg total) by mouth daily.     Endocrinology:  Hypothyroid Agents Passed - 02/20/2023 11:38 AM       Passed - TSH in normal range and within 360 days    TSH  Date Value Ref Range Status  01/11/2023 3.840 0.450 - 4.500 uIU/mL Final         Passed - Valid encounter within last 12 months    Recent Outpatient Visits           1 month ago Benign hypertension with CKD (chronic kidney disease) stage III (HCC)   [place] [facility] [place], Corrie DandyJolene T, NP   4 months ago Breast pain, left   [place] [facility] Gabriel CirriWicker, Cheryl, NP   10 months ago Hyperlipidemia, unspecified hyperlipidemia type   [place] [facility] Vigg, Avanti, MD   1 year ago Screen for colon cancer   [place] [facility] Vigg, Avanti, MD   1 year ago Screening for colon cancer   [place] [facility] Loura PardonVigg, Avanti, MD  Future Appointments             In 4 months Cannady, Dorie Rank, NP [place] [facility], [facility]

## 2023-02-21 NOTE — Progress Notes (Signed)
Daily Session Note  Patient Details  Name: AYDIAN EAVEY MRN: [id number] Date of Birth: 06-04-1951 Referring Provider:   Doristine Devoid [facility]monary Rehab from 09/22/2022 in [facility] Cardiac and Pulmonary Rehab  Referring Provider [name], MD       Encounter Date: 02/21/2023  Check In:  Session Check In - 02/21/23 1133       Check-In   Supervising physician immediately available to respond to emergencies See telemetry face sheet for immediately available ER MD    Location [facility]-Cardiac & Pulmonary Rehab    Staff Present Cyndia Diver, RN, BSN, Clyde Canterbury MS, RDN, LDN;Jessica [place], MA, RCEP, CCRP, Zackery Barefoot, MS, ACSM CEP, Exercise Physiologist    Virtual Visit No    Medication changes reported     No    Fall or balance concerns reported    No    Tobacco Cessation No Change    Warm-up and Cool-down Performed on first and last piece of equipment    Resistance Training Performed Yes    VAD Patient? No    PAD/SET Patient? No      Pain Assessment   Currently in Pain? No/denies                Social History   Tobacco Use  Smoking Status Former   Packs/day: 1.00   Years: 35.00   Additional pack years: 0.00   Total pack years: 35.00   Types: Cigarettes   Quit date: 12/16/2005   Years since quitting: 17.1   Passive exposure: Past  Smokeless Tobacco Current   Types: Chew  Tobacco Comments   pt states he very rarely uses smokeless tobacco     Goals Met:  Independence with exercise equipment Exercise tolerated well No report of concerns or symptoms today  Goals Unmet:  Not Applicable  Comments: Pt able to follow exercise prescription today without complaint.  Will continue to monitor for progression.    Dr. Bethann Punches is Medical Director for [facility] Cardiac Rehabilitation.  Dr. Vida Rigger is Medical Director for [facility] Pulmonary Rehabilitation.

## 2023-02-23 ENCOUNTER — Encounter: Payer: Medicare HMO | Admitting: *Deleted

## 2023-02-23 DIAGNOSIS — I5022 Chronic systolic (congestive) heart failure: Secondary | ICD-10-CM | POA: Diagnosis not present

## 2023-02-23 NOTE — Progress Notes (Signed)
Daily Session Note  Patient Details  Name: Stuart Harvey MRN: [id number] Date of Birth: 1951-10-15 Referring Provider:   Doristine Devoid [facility]monary Rehab from 09/22/2022 in [facility] Cardiac and Pulmonary Rehab  Referring Provider [name], MD       Encounter Date: 02/23/2023  Check In:  Session Check In - 02/23/23 1115       Check-In   Supervising physician immediately available to respond to emergencies See telemetry face sheet for immediately available ER MD    Location [facility]-Cardiac & Pulmonary Rehab    Staff Present Lanny Hurst, RN, ADN;Meredith Jewel Baize, RN BSN;Jessica Juanetta Gosling, MA, RCEP, CCRP, CCET;Joseph [place], [place]    Virtual Visit No    Medication changes reported     No    Fall or balance concerns reported    No    Warm-up and Cool-down Performed on first and last piece of equipment    Resistance Training Performed Yes    VAD Patient? No    PAD/SET Patient? No      Pain Assessment   Currently in Pain? No/denies                Social History   Tobacco Use  Smoking Status Former   Packs/day: 1.00   Years: 35.00   Additional pack years: 0.00   Total pack years: 35.00   Types: Cigarettes   Quit date: 12/16/2005   Years since quitting: 17.2   Passive exposure: Past  Smokeless Tobacco Current   Types: Chew  Tobacco Comments   pt states he very rarely uses smokeless tobacco     Goals Met:  Independence with exercise equipment Exercise tolerated well No report of concerns or symptoms today Strength training completed today  Goals Unmet:  Not Applicable  Comments: Pt able to follow exercise prescription today without complaint.  Will continue to monitor for progression.    Dr. Bethann Punches is Medical Director for [facility] Cardiac Rehabilitation.  Dr. Vida Rigger is Medical Director for [facility] Pulmonary Rehabilitation.

## 2023-02-28 ENCOUNTER — Encounter: Payer: Medicare HMO | Admitting: *Deleted

## 2023-02-28 DIAGNOSIS — I5022 Chronic systolic (congestive) heart failure: Secondary | ICD-10-CM

## 2023-02-28 NOTE — Progress Notes (Signed)
Daily Session Note  Patient Details  Name: Stuart Harvey MRN: [id number] Date of Birth: 15-Aug-1951 Referring Provider:   Doristine Devoid Pulmonary Rehab from 09/22/2022 in [facility] Cardiac and Pulmonary Rehab  Referring Provider [name], MD       Encounter Date: 02/28/2023  Check In:  Session Check In - 02/28/23 1129       Check-In   Supervising physician immediately available to respond to emergencies See telemetry face sheet for immediately available ER MD    Location [facility]-Cardiac & Pulmonary Rehab    Staff Present Cyndia Diver, RN, BSN, Clyde Canterbury MS, RDN, LDN;Jessica [place], MA, RCEP, CCRP, Zackery Barefoot, MS, ACSM CEP, Exercise Physiologist    Virtual Visit No    Medication changes reported     No    Fall or balance concerns reported    No    Tobacco Cessation No Change    Warm-up and Cool-down Performed on first and last piece of equipment    Resistance Training Performed Yes    VAD Patient? No    PAD/SET Patient? No      Pain Assessment   Currently in Pain? No/denies                Social History   Tobacco Use  Smoking Status Former   Packs/day: 1.00   Years: 35.00   Additional pack years: 0.00   Total pack years: 35.00   Types: Cigarettes   Quit date: 12/16/2005   Years since quitting: 17.2   Passive exposure: Past  Smokeless Tobacco Current   Types: Chew  Tobacco Comments   pt states he very rarely uses smokeless tobacco     Goals Met:  Independence with exercise equipment Exercise tolerated well No report of concerns or symptoms today  Goals Unmet:  Not Applicable  Comments: Pt able to follow exercise prescription today without complaint.  Will continue to monitor for progression.    Dr. Bethann Punches is Medical Director for [facility] Cardiac Rehabilitation.  Dr. Vida Rigger is Medical Director for [facility] Pulmonary Rehabilitation.

## 2023-02-28 NOTE — Progress Notes (Signed)
Daily Session Note  Patient Details  Name: SOHAM HOLLETT MRN: [id number] Date of Birth: 10/10/1951 Referring Provider:   [facility] from 09/22/2022 in [facility]  Referring Provider [name], MD       Encounter Date: 02/28/2023  Check In:      Social History   Tobacco Use  Smoking Status Former   Packs/day: 1.00   Years: 35.00   Additional pack years: 0.00   Total pack years: 35.00   Types: Cigarettes   Quit date: 12/16/2005   Years since quitting: 17.2   Passive exposure: Past  Smokeless Tobacco Current   Types: Chew  Tobacco Comments   pt states he very rarely uses smokeless tobacco     Goals Met:  Independence with exercise equipment Exercise tolerated well No report of concerns or symptoms today  Goals Unmet:  Not Applicable  Comments: .exg   Dr. Bethann Punches is Medical Director for [facility].  Dr. Vida Rigger is Medical Director for [facility].

## 2023-03-01 ENCOUNTER — Encounter: Payer: Self-pay | Admitting: *Deleted

## 2023-03-01 DIAGNOSIS — I5022 Chronic systolic (congestive) heart failure: Secondary | ICD-10-CM

## 2023-03-01 NOTE — Progress Notes (Signed)
Pulmonary Individual Treatment Plan  Patient Details  Name: JSOEPH Harvey MRN: [id number] Date of Birth: 09-09-1951 Referring Provider:   Flowsheet Row Pulmonary Rehab from 09/22/2022 in [facility] Cardiac and Pulmonary Rehab  Referring Provider [name], MD       Initial Encounter Date:  Flowsheet Row Pulmonary Rehab from 09/22/2022 in [facility] Cardiac and Pulmonary Rehab  Date 09/22/22       Visit Diagnosis: Heart failure, chronic systolic  Patient's Home Medications on Admission:  Current Outpatient Medications:    apixaban (ELIQUIS) 5 MG TABS tablet, Take 1 tablet (5 mg total) by mouth 2 (two) times daily., Disp: 180 tablet, Rfl: 3   buPROPion (WELLBUTRIN SR) 200 MG 12 hr tablet, Take 1 tablet (200 mg total) by mouth 2 (two) times daily., Disp: 180 tablet, Rfl: 4   Cholecalciferol 50 MCG (2000 UT) CAPS, Take 1 capsule by mouth daily. , Disp: , Rfl:    citalopram (CELEXA) 40 MG tablet, Take 1 tablet (40 mg total) by mouth daily., Disp: 90 tablet, Rfl: 0   ezetimibe (ZETIA) 10 MG tablet, Take 1 tablet (10 mg total) by mouth daily., Disp: 30 tablet, Rfl: 0   levothyroxine (SYNTHROID) 75 MCG tablet, Take 1 tablet (75 mcg total) by mouth daily., Disp: 90 tablet, Rfl: 4   metoprolol succinate (TOPROL XL) 25 MG 24 hr tablet, Take 0.5 tablets (12.5 mg total) by mouth daily., Disp: 90 tablet, Rfl: 3   omeprazole (PRILOSEC) 20 MG capsule, Take 1 capsule (20 mg total) by mouth daily., Disp: 90 capsule, Rfl: 4   rosuvastatin (CRESTOR) 40 MG tablet, Take 1 tablet (40 mg total) by mouth daily., Disp: 90 tablet, Rfl: 2   sacubitril-valsartan (ENTRESTO) 24-26 MG, Take 1 tablet by mouth 2 (two) times daily., Disp: 60 tablet, Rfl: 11  Past Medical History: Past Medical History:  Diagnosis Date   AAA (abdominal aortic aneurysm)    a.) s/p EVAR in 08/2007 by Dr. Earnestine Leys. b.) CTA on 06/30/2021 --> interval aneurysmal dilitation superior to stent graft with the juxtarenal aorta measuring 3.8 cm.    Anemia    Arthritis    both knees   Chronic anticoagulation    Apixaban   CKD (chronic kidney disease), stage II    Coronary artery disease    a.) 4v CABG @ [facility] 06/2007 (LIMA-LAD, VG-RPDA, VG-OM3,VG-D2); b.) LHC 2011: patent grafts EF 40%, c.) LHC 06/05/18: LM nl, pLAD 50%, p-mLAD 50%, ostD1 70%, ost ramus 30%, ost-pLCx 40%, pLCx 100% (chronically occluded), OM3 100%, p-dRCA 100% (chronically occluded), LIMA-LAD patent, VG-RPDA patent, VG-OM3 mid-graft 60% and 80%   Depression    GERD (gastroesophageal reflux disease)    Hernia of abdominal cavity    History of 2019 novel coronavirus disease (COVID-19) 09/2019   Hyperlipidemia    Hypertension    Hypothyroidism    Iliac artery aneurysm, right    a.) at the distal landing zone of the right iliac limb measuring 3 cm   Right middle lobe pulmonary nodule 06/30/2021   a.) measured 8 mm by CT on 06/30/2021.   S/P CABG x 4 06/25/2007   a.) LIMA-LAD, SVG-RPDA, SVG-OM3, SVG-D2   Sigmoid diverticulosis    Ventricular tachycardia 05/2018   a.) found unresponsive by EMS; VT noted --> defibrillation achieved ROSC. Briefly intubated. Episode felt to be secondary to heat stoke.    Tobacco Use: Social History   Tobacco Use  Smoking Status Former   Packs/day: 1.00   Years: 35.00   Additional pack years:  0.00   Total pack years: 35.00   Types: Cigarettes   Quit date: 12/16/2005   Years since quitting: 17.2   Passive exposure: Past  Smokeless Tobacco Current   Types: Chew  Tobacco Comments   pt states he very rarely uses smokeless tobacco     Labs: Review Flowsheet  More data exists      Latest Ref Rng & Units 02/18/2021 09/22/2021 03/29/2022 10/11/2022 01/11/2023  Labs for ITP Cardiac and Pulmonary Rehab  Cholestrol 100 - 199 mg/dL 161  096  045  409  811   LDL (calc) 0 - 99 mg/dL 58  77  74  80  76   HDL-C >39 mg/dL 45  47  49  46  52   Trlycerides 0 - 149 mg/dL 914  782  956  213  99   Hemoglobin A1c 4.8 - 5.6 % - - - - 6.1       Pulmonary Assessment Scores:  Pulmonary Assessment Scores     Row Name 09/22/22 1338 09/27/22 1131 02/21/23 1034     ADL UCSD   ADL Phase Entry Entry Exit   SOB Score total -- 37 --   Rest -- 1 --   Walk -- 1 --   Stairs -- 2 --   Bath -- 1 --   Dress -- 1 --   Shop -- 2 --     CAT Score   CAT Score -- 13 --     mMRC Score   mMRC Score 1 -- 1    Row Name 02/28/23 1033         ADL UCSD   ADL Phase Exit     SOB Score total 38     Rest 1     Walk 1     Stairs 2     Bath 1     Dress 1     Shop 2       CAT Score   CAT Score 13       mMRC Score   mMRC Score 1              UCSD: Self-administered rating of dyspnea associated with activities of daily living (ADLs) 6-point scale (0 = "not at all" to 5 = "maximal or unable to do because of breathlessness")  Scoring Scores range from 0 to 120.  Minimally important difference is 5 units  CAT: CAT can identify the health impairment of COPD patients and is better correlated with disease progression.  CAT has a scoring range of zero to 40. The CAT score is classified into four groups of low (less than 10), medium (10 - 20), high (21-30) and very high (31-40) based on the impact level of disease on health status. A CAT score over 10 suggests significant symptoms.  A worsening CAT score could be explained by an exacerbation, poor medication adherence, poor inhaler technique, or progression of COPD or comorbid conditions.  CAT MCID is 2 points  mMRC: mMRC (Modified Medical Research Council) Dyspnea Scale is used to assess the degree of baseline functional disability in patients of respiratory disease due to dyspnea. No minimal important difference is established. A decrease in score of 1 point or greater is considered a positive change.   Pulmonary Function Assessment:  Pulmonary Function Assessment - 09/12/22 1113       Breath   Shortness of Breath Yes;Limiting activity             Exercise  Target  Goals: Exercise Program Goal: Individual exercise prescription set using results from initial 6 min walk test and THRR while considering  patient's activity barriers and safety.   Exercise Prescription Goal: Initial exercise prescription builds to 30-45 minutes a day of aerobic activity, 2-3 days per week.  Home exercise guidelines will be given to patient during program as part of exercise prescription that the participant will acknowledge.  Education: Aerobic Exercise: - Group verbal and visual presentation on the components of exercise prescription. Introduces F.I.T.T principle from ACSM for exercise prescriptions.  Reviews F.I.T.T. principles of aerobic exercise including progression. Written material given at graduation. Flowsheet Row Pulmonary Rehab from 02/02/2023 in [facility] Cardiac and Pulmonary Rehab  Education need identified 09/22/22       Education: Resistance Exercise: - Group verbal and visual presentation on the components of exercise prescription. Introduces F.I.T.T principle from ACSM for exercise prescriptions  Reviews F.I.T.T. principles of resistance exercise including progression. Written material given at graduation. Flowsheet Row Pulmonary Rehab from 02/02/2023 in [facility] Cardiac and Pulmonary Rehab  Date 10/04/22  Educator NT  Instruction Review Code 1- Verbalizes Understanding        Education: Exercise & Equipment Safety: - Individual verbal instruction and demonstration of equipment use and safety with use of the equipment. Flowsheet Row Pulmonary Rehab from 02/02/2023 in [facility] Cardiac and Pulmonary Rehab  Date 09/12/22  Educator [facility]  Instruction Review Code 1- Verbalizes Understanding       Education: Exercise Physiology & General Exercise Guidelines: - Group verbal and written instruction with models to review the exercise physiology of the cardiovascular system and associated critical values. Provides general exercise guidelines with specific guidelines to those  with heart or lung disease.    Education: Flexibility, Balance, Mind/Body Relaxation: - Group verbal and visual presentation with interactive activity on the components of exercise prescription. Introduces F.I.T.T principle from ACSM for exercise prescriptions. Reviews F.I.T.T. principles of flexibility and balance exercise training including progression. Also discusses the mind body connection.  Reviews various relaxation techniques to help reduce and manage stress (i.e. Deep breathing, progressive muscle relaxation, and visualization). Balance handout provided to take home. Written material given at graduation. Flowsheet Row Pulmonary Rehab from 02/02/2023 in [facility] Cardiac and Pulmonary Rehab  Date 10/04/22  Educator NT  Instruction Review Code 1- Verbalizes Understanding       Activity Barriers & Risk Stratification:  Activity Barriers & Cardiac Risk Stratification - 09/22/22 1327       Activity Barriers & Cardiac Risk Stratification   Activity Barriers Left Knee Replacement;Right Hip Replacement;Other (comment);Shortness of Breath    Comments Hip Pain from Aneurysm    Cardiac Risk Stratification High             6 Minute Walk:  6 Minute Walk     Row Name 11/21/22 1338 02/21/23 1030       6 Minute Walk   Phase Initial  Completed on 09/22/2022 Discharge    Distance 1215 feet 1335 feet    Distance % Change -- 9.8 %    Distance Feet Change -- 120 ft    Walk Time 6 minutes 6 minutes    # of Rest Breaks 0 0    MPH 2.3 2.53    METS 2.38 2.85    RPE 11 13    Perceived Dyspnea  1 1    VO2 Peak 8.35 9.98    Symptoms Yes (comment) Yes (comment)    Comments Hip pain 4/10 Hip pain 6/10  Resting HR 66 bpm 87 bpm    Resting BP 102/60 124/60    Resting Oxygen Saturation  96 % 97 %    Exercise Oxygen Saturation  during 6 min walk 94 % 94 %    Max Ex. HR 88 bpm 102 bpm    Max Ex. BP 118/76 136/74    2 Minute Post BP 108/68 122/60      Interval HR   1 Minute HR -- 96    2  Minute HR -- 98    3 Minute HR -- 97    4 Minute HR -- 98    5 Minute HR -- 102    6 Minute HR -- 99    2 Minute Post HR -- 93    Interval Heart Rate? -- Yes      Interval Oxygen   Interval Oxygen? -- Yes    Baseline Oxygen Saturation % -- 97 %    1 Minute Oxygen Saturation % -- 94 %    1 Minute Liters of Oxygen -- 0 L  Room Air    2 Minute Oxygen Saturation % -- 94 %    2 Minute Liters of Oxygen -- 0 L    3 Minute Oxygen Saturation % -- 96 %    3 Minute Liters of Oxygen -- 0 L    4 Minute Oxygen Saturation % -- 96 %    4 Minute Liters of Oxygen -- 0 L    5 Minute Oxygen Saturation % -- 94 %    5 Minute Liters of Oxygen -- 0 L    6 Minute Oxygen Saturation % -- 96 %    6 Minute Liters of Oxygen -- 0 L    2 Minute Post Oxygen Saturation % -- 96 %    2 Minute Post Liters of Oxygen -- 0 L            Oxygen Initial Assessment:  Oxygen Initial Assessment - 09/12/22 1112       Home Oxygen   Home Oxygen Device None    Sleep Oxygen Prescription None    Home Exercise Oxygen Prescription None    Home Resting Oxygen Prescription None      Initial 6 min Walk   Oxygen Used None      Program Oxygen Prescription   Program Oxygen Prescription None      Intervention   Short Term Goals To learn and understand importance of monitoring SPO2 with pulse oximeter and demonstrate accurate use of the pulse oximeter.;To learn and understand importance of maintaining oxygen saturations>88%;To learn and demonstrate proper pursed lip breathing techniques or other breathing techniques. ;To learn and exhibit compliance with exercise, home and travel O2 prescription    Long  Term Goals Verbalizes importance of monitoring SPO2 with pulse oximeter and return demonstration;Maintenance of O2 saturations>88%;Exhibits proper breathing techniques, such as pursed lip breathing or other method taught during program session;Compliance with respiratory medication;Exhibits compliance with exercise, home  and  travel O2 prescription             Oxygen Re-Evaluation:  Oxygen Re-Evaluation     Row Name 09/27/22 1042 10/27/22 1043 11/24/22 1011 12/20/22 1011 01/31/23 1013     Program Oxygen Prescription   Program Oxygen Prescription None None None None None     Home Oxygen   Home Oxygen Device None None None None None   Sleep Oxygen Prescription None None None None None   Home Exercise Oxygen Prescription None  None None None None   Home Resting Oxygen Prescription None None None None None     Goals/Expected Outcomes   Short Term Goals To learn and demonstrate proper pursed lip breathing techniques or other breathing techniques.  To learn and demonstrate proper pursed lip breathing techniques or other breathing techniques. ;To learn and understand importance of monitoring SPO2 with pulse oximeter and demonstrate accurate use of the pulse oximeter.;To learn and understand importance of maintaining oxygen saturations>88% To learn and demonstrate proper pursed lip breathing techniques or other breathing techniques. ;To learn and understand importance of monitoring SPO2 with pulse oximeter and demonstrate accurate use of the pulse oximeter.;To learn and understand importance of maintaining oxygen saturations>88% To learn and demonstrate proper pursed lip breathing techniques or other breathing techniques.  To learn and demonstrate proper pursed lip breathing techniques or other breathing techniques.    Long  Term Goals Exhibits proper breathing techniques, such as pursed lip breathing or other method taught during program session Exhibits proper breathing techniques, such as pursed lip breathing or other method taught during program session;Verbalizes importance of monitoring SPO2 with pulse oximeter and return demonstration;Maintenance of O2 saturations>88%;Compliance with respiratory medication Exhibits proper breathing techniques, such as pursed lip breathing or other method taught during program  session;Verbalizes importance of monitoring SPO2 with pulse oximeter and return demonstration;Maintenance of O2 saturations>88%;Compliance with respiratory medication Exhibits proper breathing techniques, such as pursed lip breathing or other method taught during program session Exhibits proper breathing techniques, such as pursed lip breathing or other method taught during program session   Comments Reviewed PLB technique with pt.  Talked about how it works and it's importance in maintaining their exercise saturations. Stuart Harvey is doing well in rehab.  He is doing well with using his PLB.  His saturations are doing well and he tries to keep any eye on them.  He feels that his breathing is starting to improve. Stuart Harvey continues to work on his PLB and finds it helpful when he feels SOB.  His saturations are continuing to do well.  His breathing is better than he started but the heart failure continues to limit his breathing. Diaphragmatic and PLB breathing explained and performed with patient. Patient has a better understanding of how to do these exercises to help with breathing performance and relaxation. Patient performed breathing techniques adequately and to practice further at home. Aside from being sick recently, Stuart Harvey reports that his shortness of breath has improved. Stuart Harvey reports typically staying active, but does not do structured exercise outside of rehab. Reiterated importance of aerobic exercise and the recommendations of 150 minutes of moderate activity per week. Suggested exercises like walking outside as the weather permits and using silver sneakers to attend a gym.   Goals/Expected Outcomes Short: Become more profiecient at using PLB. Long: Become independent at using PLB. Short: Continue to use PLB Long; Conitnue to work on breathing Short: Continue to use PLB Long; Conitnue to work on breathing Short: practice PLB and diaphragmatic breathing at home. Long: Use PLB and diaphragmatic breathing  independently post [facility]. Short: practice PLB and diaphragmatic breathing at home, exercise outside of rehab. Long: Use PLB and diaphragmatic breathing independently post [facility].    Row Name 02/16/23 1004             Program Oxygen Prescription   Program Oxygen Prescription None         Home Oxygen   Home Oxygen Device None       Sleep Oxygen Prescription None  Home Exercise Oxygen Prescription None       Home Resting Oxygen Prescription None         Goals/Expected Outcomes   Short Term Goals Other       Long  Term Goals Other       Comments Stuart Harvey feels like his breathing is doing better with his breathing. He still gets short of breath walking at times and doing yard work.       Goals/Expected Outcomes Short: continue to work on breathing techniques when short of breath. Long: maintain exercise to aid in reducing shortness of breath.                Oxygen Discharge (Final Oxygen Re-Evaluation):  Oxygen Re-Evaluation - 02/16/23 1004       Program Oxygen Prescription   Program Oxygen Prescription None      Home Oxygen   Home Oxygen Device None    Sleep Oxygen Prescription None    Home Exercise Oxygen Prescription None    Home Resting Oxygen Prescription None      Goals/Expected Outcomes   Short Term Goals Other    Long  Term Goals Other    Comments Stuart Harvey feels like his breathing is doing better with his breathing. He still gets short of breath walking at times and doing yard work.    Goals/Expected Outcomes Short: continue to work on breathing techniques when short of breath. Long: maintain exercise to aid in reducing shortness of breath.             Initial Exercise Prescription:  Initial Exercise Prescription - 09/22/22 1300       Date of Initial Exercise RX and Referring Provider   Date 09/22/22    Referring Provider [name], MD      Oxygen   Maintain Oxygen Saturation 88% or higher      Treadmill   MPH 2    Grade 0.5    Minutes  15    METs 2.67      Recumbant Bike   Level 1    RPM 50    Watts 12    Minutes 15    METs 2.38      REL-XR   Level 1    Watts 12    Speed 50    Minutes 15    METs 2.38      Prescription Details   Frequency (times per week) 2    Duration Progress to 30 minutes of continuous aerobic without signs/symptoms of physical distress      Intensity   THRR 40-80% of Max Heartrate 99-134    Ratings of Perceived Exertion 11-13    Perceived Dyspnea 0-4      Progression   Progression Continue to progress workloads to maintain intensity without signs/symptoms of physical distress.      Resistance Training   Training Prescription Yes    Weight 4 lb    Reps 10-15             Perform Capillary Blood Glucose checks as needed.  Exercise Prescription Changes:   Exercise Prescription Changes     Row Name 09/22/22 1300 10/10/22 1300 10/24/22 0900 11/01/22 1000 11/08/22 1300     Response to Exercise   Blood Pressure (Admit) 102/60 124/70 112/64 -- 98/56   Blood Pressure (Exercise) 118/76 142/74 146/72 -- 118/62   Blood Pressure (Exit) 108/68 108/70 100/64 -- 96/52   Heart Rate (Admit) 66 bpm 78 bpm 78 bpm -- 94  bpm   Heart Rate (Exercise) 88 bpm 101 bpm 90 bpm -- 130 bpm   Heart Rate (Exit) 69 bpm 79 bpm 83 bpm -- 80 bpm   Oxygen Saturation (Admit) 96 % 98 % 97 % -- 96 %   Oxygen Saturation (Exercise) 94 % 94 % 95 % -- 90 %   Oxygen Saturation (Exit) 97 % 96 % 96 % -- 98 %   Rating of Perceived Exertion (Exercise) 11 12 12  -- 12   Perceived Dyspnea (Exercise) 1 2 1  -- 1   Symptoms Hip Pain 4/10 Dizziness none -- none   Comments Results First 3 sessions of rehab -- -- --   Duration -- Progress to 30 minutes of  aerobic without signs/symptoms of physical distress Continue with 30 min of aerobic exercise without signs/symptoms of physical distress. -- Continue with 30 min of aerobic exercise without signs/symptoms of physical distress.   Intensity -- THRR unchanged THRR  unchanged -- THRR unchanged     Progression   Progression -- Continue to progress workloads to maintain intensity without signs/symptoms of physical distress. Continue to progress workloads to maintain intensity without signs/symptoms of physical distress. -- Continue to progress workloads to maintain intensity without signs/symptoms of physical distress.   Average METs -- 2.66 3.24 -- 3.07     Resistance Training   Training Prescription -- Yes Yes -- Yes   Weight -- 4 lb\ 4 lb -- 6 lb   Reps -- 10-15 10-15 -- 10-15     Interval Training   Interval Training -- No No -- No     Treadmill   MPH -- 1.5 2.1 -- 2.1   Grade -- 0 0 -- 0.5   Minutes -- 15 15 -- 15   METs -- 2.15 2.61 -- 2.75     Recumbant Bike   Level -- -- 5 -- 3   Watts -- -- 33 -- 34   Minutes -- -- 15 -- 15   METs -- -- 2.39 -- --     NuStep   Level -- 3 -- -- 4   Minutes -- 15 -- -- 15   METs -- 2 -- -- 3.9     REL-XR   Level -- 1 2 -- 2   Minutes -- 15 15 -- 15   METs -- 3.9 4 -- 3.5     Home Exercise Plan   Plans to continue exercise at -- -- -- Home (comment)  walking Home (comment)  walking   Frequency -- -- -- Add 3 additional days to program exercise sessions. Add 3 additional days to program exercise sessions.   Initial Home Exercises Provided -- -- -- 11/01/22 11/01/22     Oxygen   Maintain Oxygen Saturation -- 88% or higher 88% or higher -- 88% or higher    Row Name 11/21/22 1000 12/05/22 1400 12/19/22 1100 01/02/23 1300 01/16/23 1000     Response to Exercise   Blood Pressure (Admit) 116/70 96/62 104/64 124/64 100/64   Blood Pressure (Exercise) -- -- 120/58 -- --   Blood Pressure (Exit) 110/60 104/70 96/60 104/64 100/60   Heart Rate (Admit) 80 bpm 52 bpm 81 bpm 78 bpm 78 bpm   Heart Rate (Exercise) 93 bpm 96 bpm 98 bpm 120 bpm 96 bpm   Heart Rate (Exit) 80 bpm 93 bpm 85 bpm 89 bpm 79 bpm   Oxygen Saturation (Admit) 98 % 93 % 99 % 97 % 96 %   Oxygen  Saturation (Exercise) 94 % 95 % 97 % 94 %  94 %   Oxygen Saturation (Exit) 98 % 96 % 96 % 96 % 95 %   Rating of Perceived Exertion (Exercise) 11 14 13 13 13    Perceived Dyspnea (Exercise) 0 2 2 2 2    Symptoms dizziness- resolved with rest none SOB SOB SOB   Duration Continue with 30 min of aerobic exercise without signs/symptoms of physical distress. Continue with 30 min of aerobic exercise without signs/symptoms of physical distress. Continue with 30 min of aerobic exercise without signs/symptoms of physical distress. Continue with 30 min of aerobic exercise without signs/symptoms of physical distress. Continue with 30 min of aerobic exercise without signs/symptoms of physical distress.   Intensity THRR unchanged THRR unchanged THRR unchanged THRR unchanged THRR unchanged     Progression   Progression Continue to progress workloads to maintain intensity without signs/symptoms of physical distress. Continue to progress workloads to maintain intensity without signs/symptoms of physical distress. Continue to progress workloads to maintain intensity without signs/symptoms of physical distress. Continue to progress workloads to maintain intensity without signs/symptoms of physical distress. Continue to progress workloads to maintain intensity without signs/symptoms of physical distress.   Average METs 2.93 2.84 3.3 3.92 3.09     Resistance Training   Training Prescription Yes Yes Yes Yes Yes   Weight 6 lb 6 lb 5 lb 6 lb 6 lb   Reps 10-15 10-15 10-15 10-15 10-15     Interval Training   Interval Training No No No No No     Treadmill   MPH 2.1 2 2.2 2.2 2.2   Grade 0 0 1.5 1.5 1.5   Minutes 15 15 15 15 15    METs 2.61 2.53 3.14 3.14 3.14     Recumbant Bike   Level 4 -- 5 -- --   Watts 40 -- 47 -- --   Minutes 15 -- 15 -- --   METs 3.26 -- 3.51 -- --     NuStep   Level -- -- -- 4 3   Minutes -- -- -- 15 15   METs -- -- -- 8.2 5.2     Recumbant Elliptical   Level -- -- -- 1 --   Minutes -- -- -- 15 --   METs -- -- -- 1.8 --      REL-XR   Level -- -- -- 4 4   Minutes -- -- -- 15 15   METs -- -- -- 4.1 3.7     T5 Nustep   Level -- -- -- -- 3   Minutes -- -- -- -- 15   METs -- -- -- -- 2.5     Biostep-RELP   Level -- 2 4 -- --   Minutes -- 15 15 -- --   METs -- 3 4 -- --     Home Exercise Plan   Plans to continue exercise at Home (comment)  walking Home (comment)  walking Home (comment)  walking Home (comment)  walking Home (comment)  walking   Frequency Add 3 additional days to program exercise sessions. Add 3 additional days to program exercise sessions. Add 3 additional days to program exercise sessions. Add 3 additional days to program exercise sessions. Add 3 additional days to program exercise sessions.   Initial Home Exercises Provided 11/01/22 11/01/22 11/01/22 11/01/22 11/01/22     Oxygen   Maintain Oxygen Saturation 88% or higher 88% or higher 88% or higher 88% or higher 88% or  higher    Row Name 02/13/23 1100 02/27/23 1300           Response to Exercise   Blood Pressure (Admit) 124/66 126/70      Blood Pressure (Exit) 102/62 88/52      Heart Rate (Admit) 85 bpm 82 bpm      Heart Rate (Exercise) 106 bpm 102 bpm      Heart Rate (Exit) 93 bpm 87 bpm      Oxygen Saturation (Admit) 99 % 96 %      Oxygen Saturation (Exercise) 96 % 91 %      Oxygen Saturation (Exit) 95 % 97 %      Rating of Perceived Exertion (Exercise) 12 13      Perceived Dyspnea (Exercise) 1 1      Symptoms SOB SOB      Comments return back after being sick --      Duration Continue with 30 min of aerobic exercise without signs/symptoms of physical distress. Continue with 30 min of aerobic exercise without signs/symptoms of physical distress.      Intensity THRR unchanged THRR unchanged        Progression   Progression Continue to progress workloads to maintain intensity without signs/symptoms of physical distress. Continue to progress workloads to maintain intensity without signs/symptoms of physical distress.       Average METs 3.63 3.39        Resistance Training   Training Prescription Yes Yes      Weight 6 lb 6 lb      Reps 10-15 10-15        Interval Training   Interval Training No No        Treadmill   MPH 2.2 2.2      Grade 1.5 2.5      Minutes 15 15      METs 3.14 3.44        Recumbant Bike   Level 5 7      Watts 47 54      Minutes 15 15      METs 3.55 3.77        NuStep   Level -- 3      Minutes -- 15      METs -- 3.2        REL-XR   Level 5 5      Minutes 15 15      METs 6.2 3.5        Home Exercise Plan   Plans to continue exercise at Home (comment)  walking Home (comment)  walking      Frequency Add 3 additional days to program exercise sessions. Add 3 additional days to program exercise sessions.      Initial Home Exercises Provided 11/01/22 11/01/22        Oxygen   Maintain Oxygen Saturation 88% or higher 88% or higher               Exercise Comments:   Exercise Comments     Row Name 09/27/22 1041           Exercise Comments First full day of exercise!  Patient was oriented to gym and equipment including functions, settings, policies, and procedures.  Patient's individual exercise prescription and treatment plan were reviewed.  All starting workloads were established based on the results of the 6 minute walk test done at initial orientation visit.  The plan for exercise progression was also introduced and progression will be  customized based on patient's performance and goals.                Exercise Goals and Review:   Exercise Goals     Row Name 09/22/22 1334             Exercise Goals   Increase Physical Activity Yes       Intervention Provide advice, education, support and counseling about physical activity/exercise needs.;Develop an individualized exercise prescription for aerobic and resistive training based on initial evaluation findings, risk stratification, comorbidities and participant's personal goals.       Expected Outcomes  Short Term: Attend rehab on a regular basis to increase amount of physical activity.;Long Term: Add in home exercise to make exercise part of routine and to increase amount of physical activity.;Long Term: Exercising regularly at least 3-5 days a week.       Increase Strength and Stamina Yes       Intervention Provide advice, education, support and counseling about physical activity/exercise needs.;Develop an individualized exercise prescription for aerobic and resistive training based on initial evaluation findings, risk stratification, comorbidities and participant's personal goals.       Expected Outcomes Short Term: Increase workloads from initial exercise prescription for resistance, speed, and METs.;Short Term: Perform resistance training exercises routinely during rehab and add in resistance training at home;Long Term: Improve cardiorespiratory fitness, muscular endurance and strength as measured by increased METs and functional capacity ( )       Able to understand and use rate of perceived exertion (RPE) scale Yes       Intervention Provide education and explanation on how to use RPE scale       Expected Outcomes Short Term: Able to use RPE daily in rehab to express subjective intensity level;Long Term:  Able to use RPE to guide intensity level when exercising independently       Able to understand and use Dyspnea scale Yes       Intervention Provide education and explanation on how to use Dyspnea scale       Expected Outcomes Short Term: Able to use Dyspnea scale daily in rehab to express subjective sense of shortness of breath during exertion;Long Term: Able to use Dyspnea scale to guide intensity level when exercising independently       Knowledge and understanding of Target Heart Rate Range (THRR) Yes       Intervention Provide education and explanation of THRR including how the numbers were predicted and where they are located for reference       Expected Outcomes Long Term: Able to use  THRR to govern intensity when exercising independently;Short Term: Able to state/look up THRR;Short Term: Able to use daily as guideline for intensity in rehab       Able to check pulse independently Yes       Intervention Provide education and demonstration on how to check pulse in carotid and radial arteries.;Review the importance of being able to check your own pulse for safety during independent exercise       Expected Outcomes Short Term: Able to explain why pulse checking is important during independent exercise;Long Term: Able to check pulse independently and accurately       Understanding of Exercise Prescription Yes       Intervention Provide education, explanation, and written materials on patient's individual exercise prescription       Expected Outcomes Short Term: Able to explain program exercise prescription;Long Term: Able to explain home exercise prescription to  exercise independently                Exercise Goals Re-Evaluation :  Exercise Goals Re-Evaluation     Row Name 09/27/22 1041 10/10/22 1340 10/24/22 0906 10/27/22 1033 11/01/22 1002     Exercise Goal Re-Evaluation   Exercise Goals Review Able to understand and use rate of perceived exertion (RPE) scale;Able to understand and use Dyspnea scale;Knowledge and understanding of Target Heart Rate Range (THRR);Understanding of Exercise Prescription Understanding of Exercise Prescription;Increase Physical Activity;Increase Strength and Stamina Understanding of Exercise Prescription;Increase Physical Activity;Increase Strength and Stamina Understanding of Exercise Prescription;Increase Physical Activity;Increase Strength and Stamina Understanding of Exercise Prescription;Increase Physical Activity;Increase Strength and Stamina;Able to understand and use rate of perceived exertion (RPE) scale;Knowledge and understanding of Target Heart Rate Range (THRR);Able to understand and use Dyspnea scale;Able to check pulse independently    Comments Reviewed RPE scale, THR and program prescription with pt today.  Pt voiced understanding and was given a copy of goals to take home. Stuart Harvey is off to a good start in rehab. He had an overall average MET level of 2.66 METs during his first three sessions. He also did well with level 3 on the T4 and level 1 on the XR. He started on the treadmill at 1.5 mph and no incline. We will continue to monitor his progress in the program. Stuart Harvey continues to do well in rehab. He increased to level 5 on the recumbent bike and worked up to 33 watts! He was also able to increase his speed on the treadmill back up to 2.1 mph, which is closer to where his initial exercise prescription was. We will continue to monitor. Stuart Harvey is doing well in rehab.  He is not really doing exercise at home. He is staying active by doing yard work. He does feel like his strength and stamina are getting better. Reviewed home exercise with pt today.  Pt plans to walk and use weights at home for exercise.  Reviewed THR, pulse, RPE, sign and symptoms, pulse oximetery and when to call 911 or MD.  Also discussed weather considerations and indoor options.  Pt voiced understanding.   Expected Outcomes Short: Use RPE daily to regulate intensity. Long: Follow program prescription in THR. Short: increase speed on the treadmill. Long: Continue to increase strength and stamina. Short: Add on incline to treadmill now that speed is up Long: Continue to increase overall MET level Short: Start to add in more exercise at home Long: Continue to improve stamina Short: wallk more on off days Long: conitnue to exercise independently    Row Name 11/08/22 1321 11/21/22 1047 11/24/22 1005 12/05/22 1433 12/19/22 1122     Exercise Goal Re-Evaluation   Exercise Goals Review Increase Physical Activity;Increase Strength and Stamina;Understanding of Exercise Prescription Increase Physical Activity;Increase Strength and Stamina;Understanding of Exercise Prescription  Increase Physical Activity;Increase Strength and Stamina;Understanding of Exercise Prescription Increase Physical Activity;Increase Strength and Stamina;Understanding of Exercise Prescription Increase Physical Activity;Increase Strength and Stamina;Understanding of Exercise Prescription   Comments Stuart Harvey is doing well in rehab. He has continued to consistently work at a MET level above 3 METs. He has also added incline on the treadmill at 0.5% while maintaining a speed of 2.1 mph. He improved to level 4 on the T4, and has continued to do well with level 2 on the XR. We will continue to monitor his progress. Stuart Harvey has been out of rehab for several sessions due to symptoms of dizziness and lightheadedness. Patient was brought to  his cardiologist office after 1 rehab session due to continuous symptoms of dizziness which we thought was due to his medications as his BP was running low. His doctor saw him and made some medication changes that we hope will make him feel better overall. He did return back to rehab after that for once session and did well. He BP was stable and he felt food. He increased to level 4 on the recumbent bike and worked up to [employer] We will continue to monitor. Stuart Harvey is still having some spells but they did readjust his meds to help.  His anuerysm repair in his leg is still causing a lot of pain for him.  He is able to tolerate some walking, but walking in from parking lot is a hard on him.  He is walking some at home. Stuart Harvey is doing well in rehab. He recently began using the biostep and did well with level 2. He has also continued to do well with 6 lb hand weights for resistance training. He is walking at 2 mph on the treadmill with no incline, but may benefit from increasing his speed. We will continue to monitor his progress. Stuart Harvey continues to do well in rehab. He has been experiencing some low BP but not as often before and is not often symptomatic with them. He has been staying hydrated  and still in contact with his doctor. He increased on his levels on both the Biostep and recumbent bike, specifically where he has worked up to 47 watts! He also increased his workload on the treadmill to a 2.2 mph/1.5% incline. RPEs are staying in appropriate range. We will continue to monitor.   Expected Outcomes Short: Continue to increase workload on the treadmill. Long: Continue to improve strength and stamina. Short: Continue to slowly increase loads and ease back into exercise Long: Continue to build up overall strength and stamina Short: Continue to walk to build stamina. Long: Conitnue to keep eye on leg pain. Short: Try increased speed on treadmill. Long: Continue to walk to build up stamina. Short: Continue to increase incline on treadmill Long: Continue to increase overall MET level and stamina    Row Name 01/02/23 1337 01/16/23 1103 01/30/23 1417 01/31/23 1011 02/13/23 1138     Exercise Goal Re-Evaluation   Exercise Goals Review Increase Physical Activity;Increase Strength and Stamina;Understanding of Exercise Prescription Increase Physical Activity;Increase Strength and Stamina;Understanding of Exercise Prescription Increase Physical Activity;Increase Strength and Stamina;Understanding of Exercise Prescription Increase Physical Activity;Increase Strength and Stamina;Understanding of Exercise Prescription Increase Physical Activity;Increase Strength and Stamina;Understanding of Exercise Prescription   Comments Stuart Harvey is doing well in rehab. He recently increased his overall average MET level to 3.92 METs. He also improved to level 4 on the XR and has consistently walked on the treadmill at a speed of 2.2 mph and an incline of 1.5%. He increased from 5 lb to 6 lb hand weights for resistance training as well. We will continue to monitor his progress in the program. Stuart Harvey continues to do well in rehab. He has stayed consistent at level 3 on the T4 Nustep and level 4 on the XR, he would benefit from  increasing his Nustep level. His oxygen levels are staying above 88% and RPEs are staying in appropriate range. He would also benefit from increasing the treadmill speed beyond 2.2 mph. We will continue to monitor. Stuart Harvey has not attended rehab since the last review due to being sick. We will follow up with patient to see how  he is feeling and set a return date. We will continue to monitor his progress once he returns to the program. Stuart Harvey reports no structured exercise, but stays active in the yard. Reiterated importance of aerobic exercise and the recommendations of 150 minutes of moderate activity per week. Suggested exercises like walking outside as the weather permits and using silver sneakers to attend a gym. Stuart Harvey continues to do well in rehab. He did increase the level on the XR to 5 and worked up to 6.2 METS! He is due for his post next week and we hope to see improvement. We will continue to monitor.   Expected Outcomes Short: Continue to increase treadmill workload. Long: Continue to improve strength and stamina. Short: Increase treadmill speed Long: Continue to increase overall MET level and stamina Short: Return to rehab once recovered from sickness. Long: Continue to improve strength and stamina. Short: work up to 150 minutes of moderate aerobic exercise per week, activities like walking or joining a gym. Long: Continue to improve strength and stamina. Short: Improve on post Long: Continue to increase overall MET level and stamina    Row Name 02/27/23 1401             Exercise Goal Re-Evaluation   Exercise Goals Review Increase Physical Activity;Increase Strength and Stamina;Understanding of Exercise Prescription       Comments Stuart Harvey is doing well in the program and is close to graduating. He recently completed his post and improved by 120 ft! He also increased his treadmill workload by increasing his incline to 2.5% while maintaining a speed of 2.2 mph. He improved to level 7  on the recumbent bike as well. We will continue to monitor his progress until he graduates from the program.       Expected Outcomes Short: Gradute. Long: Continue to improve strength and stamina.                Discharge Exercise Prescription (Final Exercise Prescription Changes):  Exercise Prescription Changes - 02/27/23 1300       Response to Exercise   Blood Pressure (Admit) 126/70    Blood Pressure (Exit) 88/52    Heart Rate (Admit) 82 bpm    Heart Rate (Exercise) 102 bpm    Heart Rate (Exit) 87 bpm    Oxygen Saturation (Admit) 96 %    Oxygen Saturation (Exercise) 91 %    Oxygen Saturation (Exit) 97 %    Rating of Perceived Exertion (Exercise) 13    Perceived Dyspnea (Exercise) 1    Symptoms SOB    Duration Continue with 30 min of aerobic exercise without signs/symptoms of physical distress.    Intensity THRR unchanged      Progression   Progression Continue to progress workloads to maintain intensity without signs/symptoms of physical distress.    Average METs 3.39      Resistance Training   Training Prescription Yes    Weight 6 lb    Reps 10-15      Interval Training   Interval Training No      Treadmill   MPH 2.2    Grade 2.5    Minutes 15    METs 3.44      Recumbant Bike   Level 7    Watts 54    Minutes 15    METs 3.77      NuStep   Level 3    Minutes 15    METs 3.2  REL-XR   Level 5    Minutes 15    METs 3.5      Home Exercise Plan   Plans to continue exercise at Home (comment)   walking   Frequency Add 3 additional days to program exercise sessions.    Initial Home Exercises Provided 11/01/22      Oxygen   Maintain Oxygen Saturation 88% or higher             Nutrition:  Target Goals: Understanding of nutrition guidelines, daily intake of sodium 1500mg , cholesterol 200mg , calories 30% from fat and 7% or less from saturated fats, daily to have 5 or more servings of fruits and vegetables.  Education: All About  Nutrition: -Group instruction provided by verbal, written material, interactive activities, discussions, models, and posters to present general guidelines for heart healthy nutrition including fat, fiber, MyPlate, the role of sodium in heart healthy nutrition, utilization of the nutrition label, and utilization of this knowledge for meal planning. Follow up email sent as well. Written material given at graduation. Flowsheet Row Pulmonary Rehab from 02/02/2023 in [facility]  Education need identified 09/22/22  Date 10/13/22  [Nutrition 2]  Educator [facility]  Instruction Review Code 1- Verbalizes Understanding       Biometrics:  Pre Biometrics - 09/22/22 1334       Pre Biometrics   Height  (1.803 m)    Weight 216 lb 3.2 oz (98.1 kg)    BMI (Calculated) 30.17    Single Leg Stand 5 seconds   R            Post Biometrics - 02/21/23 1033        Post  Biometrics   Height  (1.803 m)    Weight 211 lb 4.8 oz (95.8 kg)    Waist Circumference 41 inches    Hip Circumference 41.5 inches    Waist to Hip Ratio 0.99 %    BMI (Calculated) 29.48    Single Leg Stand --   hips hurting too much today            Nutrition Therapy Plan and Nutrition Goals:  Nutrition Therapy & Goals - 09/22/22 1049       Nutrition Therapy   Diet Heart healthy, low Na, CKD stg 3 MNT    Drug/Food Interactions Statins/Certain Fruits    Protein (specify units) 85-95g    Fiber 30 grams    Whole Grain Foods 3 servings   Depending on labwork   Saturated Fats 16 max. grams    Fruits and Vegetables 8 servings/day    Sodium 1.5 grams      Personal Nutrition Goals   Nutrition Goal ST: practice MyPlate guidelines, focus on variety of plant foods LT: limit saturated fat <16g/day, limit sodium intake <1.5g/day, eat at least 30g of fiber per day, alter potassium and phosphorus intake based of of labwork for CKD.    Comments 72 y.o. M admitted to pulmonary rehab for chronic HF. PMHx  includes CAD, MI 2008, HLD, HTN, CKD stg 3, AAA s/p EVAR (2008), paroxysmal A-fib, HFrEF, hypothyroidism, current tobacco use (dip), arthritis, depression, GERD, diverticulosis. PSHx includes CABG 2008, EVAR 2008 and 2014, coronary/graft angiography 2019, endovascular/stent graft 2022, bilateral TKA 2017. Relevant medications includes bupropion, vit D3, citalopram, synthroid, melatonin, omeprazole, rosuvastatin. Last GFR 08/29/22 was 42. PYP Score: 56. Vegetables & Fruits 5/12. Breads, Grains & Cereals 5/12. Red & Processed Meat 7/12. Poultry 2/2. Fish & Shellfish 0/4. Beans,  Nuts & Seeds 3/4. Milk & Dairy Foods 3/6. Toppings, Oils, Seasonings & Salt 17/20. Sweets, Snacks & Restaurant Food 7/14. Beverages 7/10. He reports watching sodium B: does not usually have breakfast L: low sodium ham/turkey on sandwich (white bread) D: pork chops, chicken, spaghetti, roast beef. he likes to have baked potatoes and white beans. He reports having red meat <2x/week. He rpeorts his wife will cook dinner. He reports his wife uses liquid plant oils. Drinks: 2 cups of coffee (2 sugars), water, sometimes sweet tea or pepsi (1-2x/day). He reports having salad every meal (cucumbers, lettuce, tomato, some cheese, [place] (2 tbsp). He reports that his wife is a great support system for him and makes sure to watch what he eats. Discussed heart healthy eating and CKD stg 3 MNT.      Intervention Plan   Intervention Prescribe, educate and counsel regarding individualized specific dietary modifications aiming towards targeted core components such as weight, hypertension, lipid management, diabetes, heart failure and other comorbidities.;Nutrition handout(s) given to patient.    Expected Outcomes Short Term Goal: Understand basic principles of dietary content, such as calories, fat, sodium, cholesterol and nutrients.;Short Term Goal: A plan has been developed with personal nutrition goals set during dietitian appointment.;Long Term  Goal: Adherence to prescribed nutrition plan.             Nutrition Assessments:  MEDIFICTS Score Key: ?70 Need to make dietary changes  40-70 Heart Healthy Diet ? 40 Therapeutic Level Cholesterol Diet  Flowsheet Row Pulmonary Rehab from 09/22/2022 in [facility]  Picture Your Plate Total Score on Admission 56      Picture Your Plate Scores: <16 Unhealthy dietary pattern with much room for improvement. 41-50 Dietary pattern unlikely to meet recommendations for good health and room for improvement. 51-60 More healthful dietary pattern, with some room for improvement.  >60 Healthy dietary pattern, although there may be some specific behaviors that could be improved.   Nutrition Goals Re-Evaluation:  Nutrition Goals Re-Evaluation     Row Name 10/27/22 1040 11/24/22 1010 01/31/23 1001 02/16/23 1020       Goals   Nutrition Goal ST: practice MyPlate guidelines, focus on variety of plant foods LT: limit saturated fat <16g/day, limit sodium intake <1.5g/day, eat at least 30g of fiber per day, alter potassium and phosphorus intake based of of labwork for CKD. Short: Continue to use MyPlate guidelines Long: Continue to get a good variety ST: practice MyPlate guidelines, focus on variety of plant foods, choose whole grain bread for lunch LT: limit saturated fat <16g/day, limit sodium intake <1.5g/day, eat at least 30g of fiber per day, alter potassium and phosphorus intake based of of labwork for CKD. Watch portion sizes    Comment Stuart Harvey is doing well in rehab.  His diet is going well and he is getting in a good variety.  He is trying to wathin his sodium and sugar intake.  His wife is helping keep him on task with diet. Stuart Harvey is doing well in rehab.  His weight has been steady so he is feeling good with his diet.  His wife continues to make sure that he does not over do sodium or sugar.  They are also trying to get in a lot of variety into his diet. Stuart Harvey reports doing  well with his diet. His wife continues to support him with his diet monitoring his sodium and added sugar as well as practicing MyPlate guidelines. Reiterated new CKD stg 3 guidance  including keeping protein around 60g/day (0.6g/kg/d) and focusing on whole food sources of potassium such as fiber rich grains and vegetables while monitoring bloodwork. Patient was informed on why it is important to maintain a balanced diet when dealing with Respiratory issues. Explained that it takes a lot of energy to breath and when they are short of breath often they will need to have a good diet to help keep up with the calories they are expending for breathing.    Expected Outcome Short: Continue to use MyPlate guidelines Long: Continue to get a good variety Short: Continue to add in more variety Long: continue to focus on healthy eating ST: practice MyPlate guidelines, focus on variety of plant foods, choose whole grain bread for lunch LT: limit saturated fat <16g/day, limit sodium intake <1.5g/day, eat at least 30g of fiber per day, alter potassium and phosphorus intake based of of labwork for CKD. Short: Choose and plan snacks accordingly to patients caloric intake to improve breathing. Long: Maintain a diet independently that meets their caloric intake to aid in daily shortness of breath.             Nutrition Goals Discharge (Final Nutrition Goals Re-Evaluation):  Nutrition Goals Re-Evaluation - 02/16/23 1020       Goals   Nutrition Goal Watch portion sizes    Comment Patient was informed on why it is important to maintain a balanced diet when dealing with Respiratory issues. Explained that it takes a lot of energy to breath and when they are short of breath often they will need to have a good diet to help keep up with the calories they are expending for breathing.    Expected Outcome Short: Choose and plan snacks accordingly to patients caloric intake to improve breathing. Long: Maintain a diet independently  that meets their caloric intake to aid in daily shortness of breath.             Psychosocial: Target Goals: Acknowledge presence or absence of significant depression and/or stress, maximize coping skills, provide positive support system. Participant is able to verbalize types and ability to use techniques and skills needed for reducing stress and depression.   Education: Stress, Anxiety, and Depression - Group verbal and visual presentation to define topics covered.  Reviews how body is impacted by stress, anxiety, and depression.  Also discusses healthy ways to reduce stress and to treat/manage anxiety and depression.  Written material given at graduation. Flowsheet Row Pulmonary Rehab from 02/02/2023 in [facility] Cardiac and Pulmonary Rehab  Date 11/17/22  Educator [facility]  Instruction Review Code 1- [employer] Understanding       Education: Sleep Hygiene -Provides group verbal and written instruction about how sleep can affect your health.  Define sleep hygiene, discuss sleep cycles and impact of sleep habits. Review good sleep hygiene tips.    Initial Review & Psychosocial Screening:  Initial Psych Review & Screening - 09/12/22 1114       Initial Review   Current issues with Current Psychotropic Meds;History of Depression      Family Dynamics   Good Support System? Yes    Comments Stuart Harvey can look to his wife for suppprt. He feels concerned for his health since he also has a kidney disease.      Barriers   Psychosocial barriers to participate in program The patient should benefit from training in stress management and relaxation.      Screening Interventions   Interventions Encouraged to exercise;To provide support and resources with  identified psychosocial needs;Provide feedback about the scores to participant    Expected Outcomes Short Term goal: Utilizing psychosocial counselor, staff and physician to assist with identification of specific Stressors or current issues interfering  with healing process. Setting desired goal for each stressor or current issue identified.;Long Term Goal: Stressors or current issues are controlled or eliminated.;Short Term goal: Identification and review with participant of any Quality of Life or Depression concerns found by scoring the questionnaire.;Long Term goal: The participant improves quality of Life and PHQ9 Scores as seen by post scores and/or verbalization of changes             Quality of Life Scores:  Quality of Life - 02/28/23 1027       Quality of Life Scores   Health/Function Pre 20.27 %    Health/Function Post 22.5 %    Health/Function % Change 11 %    Socioeconomic Pre 22.17 %    Socioeconomic Post 27 %    Socioeconomic % Change  21.79 %    Psych/Spiritual Pre 22.93 %    Psych/Spiritual Post 25.93 %    Psych/Spiritual % Change 13.08 %    Family Pre 28.5 %    Family Post 28.8 %    Family % Change 1.05 %    GLOBAL Pre 22.42 %    GLOBAL Post 25.06 %    GLOBAL % Change 11.78 %            Scores of 19 and below usually indicate a poorer quality of life in these areas.  A difference of  2-3 points is a clinically meaningful difference.  A difference of 2-3 points in the total score of the Quality of Life Index has been associated with significant improvement in overall quality of life, self-image, physical symptoms, and general health in studies assessing change in quality of life.  PHQ-9: Review Flowsheet  More data exists      02/28/2023 01/11/2023 10/11/2022 09/22/2022 08/09/2022  Depression screen PHQ 2/9  Decreased Interest 1 0 1 0 0  Down, Depressed, Hopeless 0 0 0 0 0  PHQ - 2 Score 1 0 1 0 0  Altered sleeping 1 1 1 1  0  Tired, decreased energy 1 1 1 1 1   Change in appetite 0 0 0 0 1  Feeling bad or failure about yourself  0 0 0 0 0  Trouble concentrating 1 0 1 1 0  Moving slowly or fidgety/restless 0 0 0 0 0  Suicidal thoughts 0 0 0 0 0  PHQ-9 Score 4 2 4 3 2   Difficult doing work/chores Not  difficult at all Not difficult at all Not difficult at all Somewhat difficult Not difficult at all   Interpretation of Total Score  Total Score Depression Severity:  1-4 = Minimal depression, 5-9 = Mild depression, 10-14 = Moderate depression, 15-19 = Moderately severe depression, 20-27 = Severe depression   Psychosocial Evaluation and Intervention:  Psychosocial Evaluation - 09/12/22 1115       Psychosocial Evaluation & Interventions   Interventions Encouraged to exercise with the program and follow exercise prescription;Relaxation education;Stress management education    Comments Stuart Harvey can look to his wife for suppprt. He feels concerned for his health since he also has a kidney disease.    Expected Outcomes Short: Start [facility] to help with mood. Long: Maintain a healthy mental state    Continue Psychosocial Services  Follow up required by staff  Psychosocial Re-Evaluation:  Psychosocial Re-Evaluation     Row Name 10/27/22 1037 11/24/22 1007 12/20/22 1013 01/31/23 1007 02/16/23 1021     Psychosocial Re-Evaluation   Current issues with Current Stress Concerns Current Stress Concerns Current Stress Concerns;Current Psychotropic Meds;History of Depression Current Stress Concerns;Current Psychotropic Meds;History of Depression Current Stress Concerns;Current Psychotropic Meds;History of Depression   Comments Stuart Harvey is doing well in rehab.  He had a tough week as he was awaiting results from his PET scan but her got good results and they did not find any cancer!!  He was very excited with these results!  He continues to sleep well ad has a good support system at home. Stuart Harvey had some med changes to help with dizzy spells and is doing better.  He is in the donut hole with insurance and trying to pay for his medicaitons.  He continues to have pain from anuersym repair that he is constantly dealing wiht.  He sleeps good some night and not so good other nights.  He feels decent with  what he is able to get. Stuart Harvey feels less dizzy and more improved since he is switching his medications up. He is going to talk to his doctor about his blood pressure medications and possible cut the doses down. His blood pressure being low was getting to his mood. We checked his blood pressure manualy agains his automatic cuff. The readings were the same. He did not take his blood pressure medication to see if he would not be so low and  dizzy. He is talking to his doctor and wants to continue getting better. Stuart Harvey reports sleeping well for the most part and reports no stressors at this time. He enjoys yardwork to relax him and is excited for spring weather. He relies on his wife and sons who live close for support. He reports thats he is not experiencing dizziness anymore with medications. Stuart Harvey states that his wife and him walk together and she helps him keep up with his health. He sometimes feels a little depression but is able to manage it with medication and exercise.   Expected Outcomes Short: Continue to exercise for mental boost long; Continue to stay positive Short: Continue to manage pain Long: Continue to exercise for mental boost Short: Continue to manage BP medications. Long: Continue to maintain BP medcations and mood independently. Short: Continue to attend pulmonary rehab for mental health boost Long: Continue to maintain positive attitude Short: Continue to attend pulmonary rehab to reduce stress. Long: Continue to maintain positive outlook on life.   Interventions Encouraged to attend Pulmonary Rehabilitation for the exercise Encouraged to attend Pulmonary Rehabilitation for the exercise Encouraged to attend Pulmonary Rehabilitation for the exercise Encouraged to attend Pulmonary Rehabilitation for the exercise Encouraged to attend Pulmonary Rehabilitation for the exercise   Continue Psychosocial Services  Follow up required by staff Follow up required by staff Follow up required by staff Follow  up required by staff Follow up required by staff            Psychosocial Discharge (Final Psychosocial Re-Evaluation):  Psychosocial Re-Evaluation - 02/16/23 1021       Psychosocial Re-Evaluation   Current issues with Current Stress Concerns;Current Psychotropic Meds;History of Depression    Comments Stuart Harvey states that his wife and him walk together and she helps him keep up with his health. He sometimes feels a little depression but is able to manage it with medication and exercise.    Expected Outcomes Short: Continue to attend  pulmonary rehab to reduce stress. Long: Continue to maintain positive outlook on life.    Interventions Encouraged to attend Pulmonary Rehabilitation for the exercise    Continue Psychosocial Services  Follow up required by staff             Education: Education Goals: Education classes will be provided on a weekly basis, covering required topics. Participant will state understanding/return demonstration of topics presented.  Learning Barriers/Preferences:  Learning Barriers/Preferences - 09/12/22 1113       Learning Barriers/Preferences   Learning Barriers None    Learning Preferences None             General Pulmonary Education Topics:  Infection Prevention: - Provides verbal and written material to individual with discussion of infection control including proper hand washing and proper equipment cleaning during exercise session. Flowsheet Row Pulmonary Rehab from 02/02/2023 in [facility] Card[facility] and Pulmonary Rehab  Date 09/12/22  Educator [facility]  Instruction Review Code 1- Verbalizes Understanding       Falls Prevention: - Provides verbal and written material to individual with discussion of falls prevention and safety. Flowsheet Row Pulmonary Rehab from 02/02/2023 in [facility] Cardiac and Pulmonary Rehab  Date 09/12/22  Educator [facility]  Instruction Review Code 1- Verbalizes Understanding       Chronic Lung Disease Review: - Group verbal  instruction with posters, models, PowerPoint presentations and videos,  to review new updates, new respiratory medications, new advancements in procedures and treatments. Providing information on websites and "800" numbers for continued self-education. Includes information about supplement oxygen, available portable oxygen systems, continuous and intermittent flow rates, oxygen safety, concentrators, and Medicare reimbursement for oxygen. Explanation of Pulmonary Drugs, including class, frequency, complications, importance of spacers, rinsing mouth after steroid MDI's, and proper cleaning methods for nebulizers. Review of basic lung anatomy and physiology related to function, structure, and complications of lung disease. Review of risk factors. Discussion about methods for diagnosing sleep apnea and types of masks and machines for OSA. Includes a review of the use of types of environmental controls: home humidity, furnaces, filters, dust mite/pet prevention, HEPA vacuums. Discussion about weather changes, air quality and the benefits of nasal washing. Instruction on Warning signs, infection symptoms, calling MD promptly, preventive modes, and value of vaccinations. Review of effective airway clearance, coughing and/or vibration techniques. Emphasizing that all should Create an Action Plan. Written material given at graduation.   AED/CPR: - Group verbal and written instruction with the use of models to demonstrate the basic use of the AED with the basic ABC's of resuscitation.    Anatomy and Cardiac Procedures: - Group verbal and visual presentation and models provide information about basic cardiac anatomy and function. Reviews the testing methods done to diagnose heart disease and the outcomes of the test results. Describes the treatment choices: Medical Management, Angioplasty, or Coronary Bypass Surgery for treating various heart conditions including Myocardial Infarction, Angina, Valve Disease, and  Cardiac Arrhythmias.  Written material given at graduation. Flowsheet Row Pulmonary Rehab from 02/02/2023 in [facility] Cardiac and Pulmonary Rehab  Education need identified 09/22/22       Medication Safety: - Group verbal and visual instruction to review commonly prescribed medications for heart and lung disease. Reviews the medication, class of the drug, and side effects. Includes the steps to properly store meds and maintain the prescription regimen.  Written material given at graduation.   Other: -Provides group and verbal instruction on various topics (see comments)   Knowledge Questionnaire Score:  Knowledge Questionnaire Score -  02/28/23 1033       Knowledge Questionnaire Score   Pre Score 22/26    Post Score 24/26              Core Components/Risk Factors/Patient Goals at Admission:  Personal Goals and Risk Factors at Admission - 09/22/22 1336       Core Components/Risk Factors/Patient Goals on Admission    Weight Management Yes;Weight Maintenance    Intervention Weight Management: Develop a combined nutrition and exercise program designed to reach desired caloric intake, while maintaining appropriate intake of nutrient and fiber, sodium and fats, and appropriate energy expenditure required for the weight goal.;Weight Management: Provide education and appropriate resources to help participant work on and attain dietary goals.;Weight Management/Obesity: Establish reasonable short term and long term weight goals.    Admit Weight 216 lb 3.2 oz (98.1 kg)    Goal Weight: Short Term 210 lb (95.3 kg)    Goal Weight: Long Term 200 lb (90.7 kg)    Expected Outcomes Weight Maintenance: Understanding of the daily nutrition guidelines, which includes 25-35% calories from fat, 7% or less cal from saturated fats, less than 200mg  cholesterol, less than 1.5gm of sodium, & 5 or more servings of fruits and vegetables daily;Long Term: Adherence to nutrition and physical activity/exercise  program aimed toward attainment of established weight goal;Short Term: Continue to assess and modify interventions until short term weight is achieved;Understanding recommendations for meals to include 15-35% energy as protein, 25-35% energy from fat, 35-60% energy from carbohydrates, less than 200mg  of dietary cholesterol, 20-35 gm of total fiber daily;Understanding of distribution of calorie intake throughout the day with the consumption of 4-5 meals/snacks    Improve shortness of breath with ADL's Yes    Intervention Provide education, individualized exercise plan and daily activity instruction to help decrease symptoms of SOB with activities of daily living.    Expected Outcomes Short Term: Improve cardiorespiratory fitness to achieve a reduction of symptoms when performing ADLs;Long Term: Be able to perform more ADLs without symptoms or delay the onset of symptoms    Hypertension Yes    Intervention Provide education on lifestyle modifcations including regular physical activity/exercise, weight management, moderate sodium restriction and increased consumption of fresh fruit, vegetables, and low fat dairy, alcohol moderation, and smoking cessation.;Monitor prescription use compliance.    Expected Outcomes Short Term: Continued assessment and intervention until BP is < 140/32mm HG in hypertensive participants. < 130/61mm HG in hypertensive participants with diabetes, heart failure or chronic kidney disease.;Long Term: Maintenance of blood pressure at goal levels.    Lipids Yes    Intervention Provide education and support for participant on nutrition & aerobic/resistive exercise along with prescribed medications to achieve LDL 70mg , HDL >40mg .    Expected Outcomes Short Term: Participant states understanding of desired cholesterol values and is compliant with medications prescribed. Participant is following exercise prescription and nutrition guidelines.;Long Term: Cholesterol controlled with  medications as prescribed, with individualized exercise RX and with personalized nutrition plan. Value goals: LDL < 70mg , HDL > 40 mg.             Education:Diabetes - Individual verbal and written instruction to review signs/symptoms of diabetes, desired ranges of glucose level fasting, after meals and with exercise. Acknowledge that pre and post exercise glucose checks will be done for 3 sessions at entry of program.   Know Your Numbers and Heart Failure: - Group verbal and visual instruction to discuss disease risk factors for cardiac and pulmonary disease and treatment  options.  Reviews associated critical values for Overweight/Obesity, Hypertension, Cholesterol, and Diabetes.  Discusses basics of heart failure: signs/symptoms and treatments.  Introduces Heart Failure Zone chart for action plan for heart failure.  Written material given at graduation.   Core Components/Risk Factors/Patient Goals Review:   Goals and Risk Factor Review     Row Name 10/27/22 1041 11/24/22 1002 12/20/22 1012 01/31/23 0959 02/16/23 1023     Core Components/Risk Factors/Patient Goals Review   Personal Goals Review Weight Management/Obesity;Increase knowledge of respiratory medications and ability to use respiratory devices properly.;Hypertension;Improve shortness of breath with ADL's Weight Management/Obesity;Increase knowledge of respiratory medications and ability to use respiratory devices properly.;Hypertension;Improve shortness of breath with ADL's Improve shortness of breath with ADL's Improve shortness of breath with ADL's;Hypertension;Weight Management/Obesity;Lipids Improve shortness of breath with ADL's   Review Stuart Harvey is doing well in rehab.  His weight is steady.  Pressures are doing well, they have always been on the lower end of normal. His breathing is doing okay.  It has gotten better since starting.  He is doing well with his meds. Stuart Harvey is doing well in rehab.  He has been working with his  doctor to balance out his pressures.  They decreased his metoprolol and it seems to be helping some.  He still feels like it is running low but has not had as many dizzy spells.  He is still having some SOB but it is getting better.  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 His weight is still steady. Spoke to patient about their shortness of breath and what they can do to improve. Patient has been informed of breathing techniques when starting the program. Patient is informed to tell staff if they have had any med changes and that certain meds they are taking or not taking can be causing shortness of breath. Stuart Harvey reports having a cold with fever recently so he hasn't done much recently. Stuart Harvey reports that aside from being sick his shortness of breath has been getting better. Allen reports checking his BP at home 110-130/70-80s. He reports taking his medication as prescribed with no issues at this time. Stuart Harvey reports his weight has been stable. Stuart Harvey recently got labwork done in february where his A1C was 6.1 and his lipid panel came back normal. Stuart Harvey states that exercise is helping his shortness of breath even though he still has some when doing yard work. Walking is touch for him because it gives him alot of pain. He can tolerate the treadmill more since he can hold himself up.   Expected Outcomes SHort: Conitnue to keep close eye on BP to not get too low Long; Conitnue to montior risk factors Short: Conitnue to monitor BP closely Long; Continue to monitor risk factors. Short: Attend [facility] regularly to improve shortness of breath with ADL's. Long: maintain independence with ADL's Short: Attend [facility] regularly to improve shortness of breath with ADL's. Long: maintain independence with ADL's, manage risk factors independently Short: maintan weight and breathing techniques. Long: continue to improve upon ADL's post [facility].            Core Components/Risk  Factors/Patient Goals at Discharge (Final Review):   Goals and Risk Factor Review - 02/16/23 1023       Core Components/Risk Factors/Patient Goals Review   Personal Goals Review Improve shortness of breath with ADL's    Review Stuart Harvey states that exercise is helping his shortness of breath even though he still has some when  doing yard work. Walking is touch for him because it gives him alot of pain. He can tolerate the treadmill more since he can hold himself up.    Expected Outcomes Short: maintan weight and breathing techniques. Long: continue to improve upon ADL's post [facility].             ITP Comments:  ITP Comments     Row Name 09/12/22 1111 09/22/22 1105 09/27/22 1041 10/12/22 0818 11/09/22 1031   ITP Comments Virtual Visit completed. Patient informed on EP and RD appointment and 6 Minute walk test. Patient also informed of patient health questionnaires on My Chart. Patient Verbalizes understanding. Visit diagnosis can be found in [facility] 08/26/2022. Completed and gym orientation. Initial ITP created and sent for review to Dr. Bethann Punches, Medical Director. First full day of exercise!  Patient was oriented to gym and equipment including functions, settings, policies, and procedures.  Patient's individual exercise prescription and treatment plan were reviewed.  All starting workloads were established based on the results of the 6 minute walk test done at initial orientation visit.  The plan for exercise progression was also introduced and progression will be customized based on patient's performance and goals. 30 Day review completed. Medical Director ITP review done, changes made as directed, and signed approval by Medical Director.   New to program 30 Day review completed. Medical Director ITP review done, changes made as directed, and signed approval by Medical Director.    Row Name 12/07/22 [phone] 01/04/23 1419 02/01/23 0912 03/01/23 1328     ITP Comments 30 Day review completed.  Medical Director ITP review done, changes made as directed, and signed approval by Medical Director. 30 day review completed. ITP sent to Dr. Jinny Sanders, Medical Director of  Pulmonary Rehab. Continue with ITP unless changes are made by physician. 30 Day review completed. Medical Director ITP review done, changes made as directed, and signed approval by Medical Director. 30 day review completed. ITP sent to Dr. Jinny Sanders, Medical Director of  Pulmonary Rehab. Continue with ITP unless changes are made by physician.             Comments: 30 day review

## 2023-03-02 ENCOUNTER — Encounter: Payer: Medicare HMO | Admitting: *Deleted

## 2023-03-02 DIAGNOSIS — I5022 Chronic systolic (congestive) heart failure: Secondary | ICD-10-CM

## 2023-03-02 NOTE — Progress Notes (Signed)
Daily Session Note  Patient Details  Name: Stuart Harvey MRN: [id number] Date of Birth: [birth year] Referring Provider:   Doristine Devoid Pulmonary Rehab from 09/22/2022 in [facility] Cardiac and Pulmonary Rehab  Referring Provider [name], MD       Encounter Date: 03/02/2023  Check In:  Session Check In - 03/02/23 1007       Check-In   Supervising physician immediately available to respond to emergencies See telemetry face sheet for immediately available ER MD    Location [facility]-Cardiac & Pulmonary Rehab    Staff Present Lanny Hurst, RN, ADN;Jessica Juanetta Gosling, MA, RCEP, CCRP, CCET;Noah Tickle, BS, Exercise Physiologist    Virtual Visit No    Medication changes reported     No    Fall or balance concerns reported    No    Warm-up and Cool-down Performed on first and last piece of equipment    Resistance Training Performed Yes    VAD Patient? No    PAD/SET Patient? No      Pain Assessment   Currently in Pain? No/denies                Social History   Tobacco Use  Smoking Status Former   Packs/day: 1.00   Years: 35.00   Additional pack years: 0.00   Total pack years: 35.00   Types: Cigarettes   Quit date: 12/16/2005   Years since quitting: 17.2   Passive exposure: Past  Smokeless Tobacco Current   Types: Chew  Tobacco Comments   pt states he very rarely uses smokeless tobacco     Goals Met:  Independence with exercise equipment Exercise tolerated well No report of concerns or symptoms today Strength training completed today  Goals Unmet:  Not Applicable  Comments: Pt able to follow exercise prescription today without complaint.  Will continue to monitor for progression.    Dr. Bethann Punches is Medical Director for [facility] Cardiac Rehabilitation.  Dr. Vida Rigger is Medical Director for [facility] Pulmonary Rehabilitation.

## 2023-03-07 ENCOUNTER — Encounter: Payer: Medicare HMO | Admitting: *Deleted

## 2023-03-07 DIAGNOSIS — I5022 Chronic systolic (congestive) heart failure: Secondary | ICD-10-CM

## 2023-03-07 NOTE — Progress Notes (Signed)
Daily Session Note  Patient Details  Name: Stuart Harvey MRN: [id number] Date of Birth: 08/14/1951 Referring Provider:   Doristine Devoid Pulmonary Rehab from 09/22/2022 in [facility] Cardiac and Pulmonary Rehab  Referring Provider [name], MD       Encounter Date: 03/07/2023  Check In:  Session Check In - 03/07/23 0958       Check-In   Supervising physician immediately available to respond to emergencies See telemetry face sheet for immediately available ER MD    Location [facility]-Cardiac & Pulmonary Rehab    Staff Present Lanny Hurst, RN, ADN;Jessica Juanetta Gosling, MA, RCEP, CCRP, Zackery Barefoot, MS, ACSM CEP, Exercise Physiologist    Virtual Visit No    Medication changes reported     No    Fall or balance concerns reported    No    Warm-up and Cool-down Performed on first and last piece of equipment    Resistance Training Performed Yes    VAD Patient? No    PAD/SET Patient? No      Pain Assessment   Currently in Pain? No/denies                Social History   Tobacco Use  Smoking Status Former   Packs/day: 1.00   Years: 35.00   Additional pack years: 0.00   Total pack years: 35.00   Types: Cigarettes   Quit date: 12/16/2005   Years since quitting: 17.2   Passive exposure: Past  Smokeless Tobacco Current   Types: Chew  Tobacco Comments   pt states he very rarely uses smokeless tobacco     Goals Met:  Independence with exercise equipment Exercise tolerated well No report of concerns or symptoms today Strength training completed today  Goals Unmet:  Not Applicable  Comments: Pt able to follow exercise prescription today without complaint.  Will continue to monitor for progression.    Dr. Bethann Punches is Medical Director for [facility] Cardiac Rehabilitation.  Dr. Vida Rigger is Medical Director for [facility] Pulmonary Rehabilitation.

## 2023-03-09 ENCOUNTER — Encounter: Payer: Medicare HMO | Admitting: *Deleted

## 2023-03-09 DIAGNOSIS — I5022 Chronic systolic (congestive) heart failure: Secondary | ICD-10-CM

## 2023-03-09 NOTE — Progress Notes (Signed)
Daily Session Note  Patient Details  Name: ZAKARY KIMURA MRN: [id number] Date of Birth: 1951-06-25 Referring Provider:   Flowsheet Row Pulmonary Rehab from 09/22/2022 in [facility] Cardiac and Pulmonary Rehab  Referring Provider [name], MD       Encounter Date: 03/09/2023  Check In:  Session Check In - 03/09/23 1049       Check-In   Supervising physician immediately available to respond to emergencies See telemetry face sheet for immediately available ER MD    Location [facility]-Cardiac & Pulmonary Rehab    Staff Present Lanny Hurst, RN, ADN;Denise Rhew, PhD, RN, CNS, Josephine Cables, BS, Exercise Physiologist    Virtual Visit No    Medication changes reported     No    Fall or balance concerns reported    No    Warm-up and Cool-down Performed on first and last piece of equipment    Resistance Training Performed Yes    VAD Patient? No    PAD/SET Patient? No      Pain Assessment   Currently in Pain? No/denies                Social History   Tobacco Use  Smoking Status Former   Packs/day: 1.00   Years: 35.00   Additional pack years: 0.00   Total pack years: 35.00   Types: Cigarettes   Quit date: 12/16/2005   Years since quitting: 17.2   Passive exposure: Past  Smokeless Tobacco Current   Types: Chew  Tobacco Comments   pt states he very rarely uses smokeless tobacco     Goals Met:  Independence with exercise equipment Exercise tolerated well No report of concerns or symptoms today Strength training completed today  Goals Unmet:  Not Applicable  Comments:  Lorimer graduated today from  rehab with 36 sessions completed.  Details of the patient's exercise prescription and what He needs to do in order to continue the prescription and progress were discussed with patient.  Patient was given a copy of prescription and goals.  Patient verbalized understanding.  Wynne plans to continue to exercise by walking.    Dr. Bethann Punches is Medical Director for  [facility] Cardiac Rehabilitation.  Dr. Vida Rigger is Medical Director for [facility] Pulmonary Rehabilitation.

## 2023-03-09 NOTE — Progress Notes (Signed)
Pulmonary Individual Treatment Plan  Patient Details  Name: Stuart Harvey MRN: [id number] Date of Birth: 03-07-1951 Referring Provider:   Flowsheet Row Pulmonary Rehab from 09/22/2022 in [facility] Cardiac and Pulmonary Rehab  Referring Provider [name], MD       Initial Encounter Date:  Flowsheet Row Pulmonary Rehab from 09/22/2022 in [facility] Cardiac and Pulmonary Rehab  Date 09/22/22       Visit Diagnosis: Heart failure, chronic systolic  Patient's Home Medications on Admission:  Current Outpatient Medications:    apixaban (ELIQUIS) 5 MG TABS tablet, Take 1 tablet (5 mg total) by mouth 2 (two) times daily., Disp: 180 tablet, Rfl: 3   buPROPion (WELLBUTRIN SR) 200 MG 12 hr tablet, Take 1 tablet (200 mg total) by mouth 2 (two) times daily., Disp: 180 tablet, Rfl: 4   Cholecalciferol 50 MCG (2000 UT) CAPS, Take 1 capsule by mouth daily. , Disp: , Rfl:    citalopram (CELEXA) 40 MG tablet, Take 1 tablet (40 mg total) by mouth daily., Disp: 90 tablet, Rfl: 0   ezetimibe (ZETIA) 10 MG tablet, Take 1 tablet (10 mg total) by mouth daily., Disp: 30 tablet, Rfl: 0   levothyroxine (SYNTHROID) 75 MCG tablet, Take 1 tablet (75 mcg total) by mouth daily., Disp: 90 tablet, Rfl: 4   metoprolol succinate (TOPROL XL) 25 MG 24 hr tablet, Take 0.5 tablets (12.5 mg total) by mouth daily., Disp: 90 tablet, Rfl: 3   omeprazole (PRILOSEC) 20 MG capsule, Take 1 capsule (20 mg total) by mouth daily., Disp: 90 capsule, Rfl: 4   rosuvastatin (CRESTOR) 40 MG tablet, Take 1 tablet (40 mg total) by mouth daily., Disp: 90 tablet, Rfl: 2   sacubitril-valsartan (ENTRESTO) 24-26 MG, Take 1 tablet by mouth 2 (two) times daily., Disp: 60 tablet, Rfl: 11  Past Medical History: Past Medical History:  Diagnosis Date   AAA (abdominal aortic aneurysm)    a.) s/p EVAR in 08/2007 by Dr. Earnestine Leys. b.) CTA on 06/30/2021 --> interval aneurysmal dilitation superior to stent graft with the juxtarenal aorta measuring 3.8 cm.    Anemia    Arthritis    both knees   Chronic anticoagulation    Apixaban   CKD (chronic kidney disease), stage II    Coronary artery disease    a.) 4v CABG @ [facility] 06/2007 (LIMA-LAD, VG-RPDA, VG-OM3,VG-D2); b.) LHC 2011: patent grafts EF 40%, c.) LHC 06/05/18: LM nl, pLAD 50%, p-mLAD 50%, ostD1 70%, ost ramus 30%, ost-pLCx 40%, pLCx 100% (chronically occluded), OM3 100%, p-dRCA 100% (chronically occluded), LIMA-LAD patent, VG-RPDA patent, VG-OM3 mid-graft 60% and 80%   Depression    GERD (gastroesophageal reflux disease)    Hernia of abdominal cavity    History of 2019 novel coronavirus disease (COVID-19) 09/2019   Hyperlipidemia    Hypertension    Hypothyroidism    Iliac artery aneurysm, right    a.) at the distal landing zone of the right iliac limb measuring 3 cm   Right middle lobe pulmonary nodule 06/30/2021   a.) measured 8 mm by CT on 06/30/2021.   S/P CABG x 4 06/25/2007   a.) LIMA-LAD, SVG-RPDA, SVG-OM3, SVG-D2   Sigmoid diverticulosis    Ventricular tachycardia 05/2018   a.) found unresponsive by EMS; VT noted --> defibrillation achieved ROSC. Briefly intubated. Episode felt to be secondary to heat stoke.    Tobacco Use: Social History   Tobacco Use  Smoking Status Former   Packs/day: 1.00   Years: 35.00   Additional pack years:  0.00   Total pack years: 35.00   Types: Cigarettes   Quit date: 12/16/2005   Years since quitting: 17.2   Passive exposure: Past  Smokeless Tobacco Current   Types: Chew  Tobacco Comments   pt states he very rarely uses smokeless tobacco     Labs: Review Flowsheet  More data exists      Latest Ref Rng & Units 02/18/2021 09/22/2021 03/29/2022 10/11/2022 01/11/2023  Labs for ITP Cardiac and Pulmonary Rehab  Cholestrol 100 - 199 mg/dL 161  096  045  409  811   LDL (calc) 0 - 99 mg/dL 58  77  74  80  76   HDL-C >39 mg/dL 45  47  49  46  52   Trlycerides 0 - 149 mg/dL 914  782  956  213  99   Hemoglobin A1c 4.8 - 5.6 % - - - - 6.1       Pulmonary Assessment Scores:  Pulmonary Assessment Scores     Row Name 09/22/22 1338 09/27/22 1131 02/21/23 1034     ADL UCSD   ADL Phase Entry Entry Exit   SOB Score total -- 37 --   Rest -- 1 --   Walk -- 1 --   Stairs -- 2 --   Bath -- 1 --   Dress -- 1 --   Shop -- 2 --     CAT Score   CAT Score -- 13 --     mMRC Score   mMRC Score 1 -- 1    Row Name 02/28/23 1033         ADL UCSD   ADL Phase Exit     SOB Score total 38     Rest 1     Walk 1     Stairs 2     Bath 1     Dress 1     Shop 2       CAT Score   CAT Score 13       mMRC Score   mMRC Score 1              UCSD: Self-administered rating of dyspnea associated with activities of daily living (ADLs) 6-point scale (0 = "not at all" to 5 = "maximal or unable to do because of breathlessness")  Scoring Scores range from 0 to 120.  Minimally important difference is 5 units  CAT: CAT can identify the health impairment of COPD patients and is better correlated with disease progression.  CAT has a scoring range of zero to 40. The CAT score is classified into four groups of low (less than 10), medium (10 - 20), high (21-30) and very high (31-40) based on the impact level of disease on health status. A CAT score over 10 suggests significant symptoms.  A worsening CAT score could be explained by an exacerbation, poor medication adherence, poor inhaler technique, or progression of COPD or comorbid conditions.  CAT MCID is 2 points  mMRC: mMRC (Modified Medical Research Council) Dyspnea Scale is used to assess the degree of baseline functional disability in patients of respiratory disease due to dyspnea. No minimal important difference is established. A decrease in score of 1 point or greater is considered a positive change.   Pulmonary Function Assessment:  Pulmonary Function Assessment - 09/12/22 1113       Breath   Shortness of Breath Yes;Limiting activity             Exercise  Target  Goals: Exercise Program Goal: Individual exercise prescription set using results from initial 6 min walk test and THRR while considering  patient's activity barriers and safety.   Exercise Prescription Goal: Initial exercise prescription builds to 30-45 minutes a day of aerobic activity, 2-3 days per week.  Home exercise guidelines will be given to patient during program as part of exercise prescription that the participant will acknowledge.  Education: Aerobic Exercise: - Group verbal and visual presentation on the components of exercise prescription. Introduces F.I.T.T principle from ACSM for exercise prescriptions.  Reviews F.I.T.T. principles of aerobic exercise including progression. Written material given at graduation. Flowsheet Row Pulmonary Rehab from 02/02/2023 in [facility] Cardiac and Pulmonary Rehab  Education need identified 09/22/22       Education: Resistance Exercise: - Group verbal and visual presentation on the components of exercise prescription. Introduces F.I.T.T principle from ACSM for exercise prescriptions  Reviews F.I.T.T. principles of resistance exercise including progression. Written material given at graduation. Flowsheet Row Pulmonary Rehab from 02/02/2023 in [facility] Cardiac and Pulmonary Rehab  Date 10/04/22  Educator NT  Instruction Review Code 1- Verbalizes Understanding        Education: Exercise & Equipment Safety: - Individual verbal instruction and demonstration of equipment use and safety with use of the equipment. Flowsheet Row Pulmonary Rehab from 02/02/2023 in [facility] Cardiac and Pulmonary Rehab  Date 09/12/22  Educator [facility]  Instruction Review Code 1- Verbalizes Understanding       Education: Exercise Physiology & General Exercise Guidelines: - Group verbal and written instruction with models to review the exercise physiology of the cardiovascular system and associated critical values. Provides general exercise guidelines with specific guidelines to those  with heart or lung disease.    Education: Flexibility, Balance, Mind/Body Relaxation: - Group verbal and visual presentation with interactive activity on the components of exercise prescription. Introduces F.I.T.T principle from ACSM for exercise prescriptions. Reviews F.I.T.T. principles of flexibility and balance exercise training including progression. Also discusses the mind body connection.  Reviews various relaxation techniques to help reduce and manage stress (i.e. Deep breathing, progressive muscle relaxation, and visualization). Balance handout provided to take home. Written material given at graduation. Flowsheet Row Pulmonary Rehab from 02/02/2023 in [facility] Cardiac and Pulmonary Rehab  Date 10/04/22  Educator NT  Instruction Review Code 1- Verbalizes Understanding       Activity Barriers & Risk Stratification:  Activity Barriers & Cardiac Risk Stratification - 09/22/22 1327       Activity Barriers & Cardiac Risk Stratification   Activity Barriers Left Knee Replacement;Right Hip Replacement;Other (comment);Shortness of Breath    Comments Hip Pain from Aneurysm    Cardiac Risk Stratification High             6 Minute Walk:  6 Minute Walk     Row Name 11/21/22 1338 02/21/23 1030       6 Minute Walk   Phase Initial  Completed on 09/22/2022 Discharge    Distance 1215 feet 1335 feet    Distance % Change -- 9.8 %    Distance Feet Change -- 120 ft    Walk Time 6 minutes 6 minutes    # of Rest Breaks 0 0    MPH 2.3 2.53    METS 2.38 2.85    RPE 11 13    Perceived Dyspnea  1 1    VO2 Peak 8.35 9.98    Symptoms Yes (comment) Yes (comment)    Comments Hip pain 4/10 Hip pain 6/10  Resting HR 66 bpm 87 bpm    Resting BP 102/60 124/60    Resting Oxygen Saturation  96 % 97 %    Exercise Oxygen Saturation  during 6 min walk 94 % 94 %    Max Ex. HR 88 bpm 102 bpm    Max Ex. BP 118/76 136/74    2 Minute Post BP 108/68 122/60      Interval HR   1 Minute HR -- 96    2  Minute HR -- 98    3 Minute HR -- 97    4 Minute HR -- 98    5 Minute HR -- 102    6 Minute HR -- 99    2 Minute Post HR -- 93    Interval Heart Rate? -- Yes      Interval Oxygen   Interval Oxygen? -- Yes    Baseline Oxygen Saturation % -- 97 %    1 Minute Oxygen Saturation % -- 94 %    1 Minute Liters of Oxygen -- 0 L  Room Air    2 Minute Oxygen Saturation % -- 94 %    2 Minute Liters of Oxygen -- 0 L    3 Minute Oxygen Saturation % -- 96 %    3 Minute Liters of Oxygen -- 0 L    4 Minute Oxygen Saturation % -- 96 %    4 Minute Liters of Oxygen -- 0 L    5 Minute Oxygen Saturation % -- 94 %    5 Minute Liters of Oxygen -- 0 L    6 Minute Oxygen Saturation % -- 96 %    6 Minute Liters of Oxygen -- 0 L    2 Minute Post Oxygen Saturation % -- 96 %    2 Minute Post Liters of Oxygen -- 0 L            Oxygen Initial Assessment:  Oxygen Initial Assessment - 09/12/22 1112       Home Oxygen   Home Oxygen Device None    Sleep Oxygen Prescription None    Home Exercise Oxygen Prescription None    Home Resting Oxygen Prescription None      Initial 6 min Walk   Oxygen Used None      Program Oxygen Prescription   Program Oxygen Prescription None      Intervention   Short Term Goals To learn and understand importance of monitoring SPO2 with pulse oximeter and demonstrate accurate use of the pulse oximeter.;To learn and understand importance of maintaining oxygen saturations>88%;To learn and demonstrate proper pursed lip breathing techniques or other breathing techniques. ;To learn and exhibit compliance with exercise, home and travel O2 prescription    Long  Term Goals Verbalizes importance of monitoring SPO2 with pulse oximeter and return demonstration;Maintenance of O2 saturations>88%;Exhibits proper breathing techniques, such as pursed lip breathing or other method taught during program session;Compliance with respiratory medication;Exhibits compliance with exercise, home  and  travel O2 prescription             Oxygen Re-Evaluation:  Oxygen Re-Evaluation     Row Name 09/27/22 1042 10/27/22 1043 11/24/22 1011 12/20/22 1011 01/31/23 1013     Program Oxygen Prescription   Program Oxygen Prescription None None None None None     Home Oxygen   Home Oxygen Device None None None None None   Sleep Oxygen Prescription None None None None None   Home Exercise Oxygen Prescription None  None None None None   Home Resting Oxygen Prescription None None None None None     Goals/Expected Outcomes   Short Term Goals To learn and demonstrate proper pursed lip breathing techniques or other breathing techniques.  To learn and demonstrate proper pursed lip breathing techniques or other breathing techniques. ;To learn and understand importance of monitoring SPO2 with pulse oximeter and demonstrate accurate use of the pulse oximeter.;To learn and understand importance of maintaining oxygen saturations>88% To learn and demonstrate proper pursed lip breathing techniques or other breathing techniques. ;To learn and understand importance of monitoring SPO2 with pulse oximeter and demonstrate accurate use of the pulse oximeter.;To learn and understand importance of maintaining oxygen saturations>88% To learn and demonstrate proper pursed lip breathing techniques or other breathing techniques.  To learn and demonstrate proper pursed lip breathing techniques or other breathing techniques.    Long  Term Goals Exhibits proper breathing techniques, such as pursed lip breathing or other method taught during program session Exhibits proper breathing techniques, such as pursed lip breathing or other method taught during program session;Verbalizes importance of monitoring SPO2 with pulse oximeter and return demonstration;Maintenance of O2 saturations>88%;Compliance with respiratory medication Exhibits proper breathing techniques, such as pursed lip breathing or other method taught during program  session;Verbalizes importance of monitoring SPO2 with pulse oximeter and return demonstration;Maintenance of O2 saturations>88%;Compliance with respiratory medication Exhibits proper breathing techniques, such as pursed lip breathing or other method taught during program session Exhibits proper breathing techniques, such as pursed lip breathing or other method taught during program session   Comments Reviewed PLB technique with pt.  Talked about how it works and it's importance in maintaining their exercise saturations. Stuart Harvey is doing well in rehab.  He is doing well with using his PLB.  His saturations are doing well and he tries to keep any eye on them.  He feels that his breathing is starting to improve. Stuart Harvey continues to work on his PLB and finds it helpful when he feels SOB.  His saturations are continuing to do well.  His breathing is better than he started but the heart failure continues to limit his breathing. Diaphragmatic and PLB breathing explained and performed with patient. Patient has a better understanding of how to do these exercises to help with breathing performance and relaxation. Patient performed breathing techniques adequately and to practice further at home. Aside from being sick recently, Stuart Harvey reports that his shortness of breath has improved. Stuart Harvey reports typically staying active, but does not do structured exercise outside of rehab. Reiterated importance of aerobic exercise and the recommendations of 150 minutes of moderate activity per week. Suggested exercises like walking outside as the weather permits and using silver sneakers to attend a gym.   Goals/Expected Outcomes Short: Become more profiecient at using PLB. Long: Become independent at using PLB. Short: Continue to use PLB Long; Conitnue to work on breathing Short: Continue to use PLB Long; Conitnue to work on breathing Short: practice PLB and diaphragmatic breathing at home. Long: Use PLB and diaphragmatic breathing  independently post [employer]. Short: practice PLB and diaphragmatic breathing at home, exercise outside of rehab. Long: Use PLB and diaphragmatic breathing independently post [employer].    Row Name 02/16/23 1004             Program Oxygen Prescription   Program Oxygen Prescription None         Home Oxygen   Home Oxygen Device None       Sleep Oxygen Prescription None  Home Exercise Oxygen Prescription None       Home Resting Oxygen Prescription None         Goals/Expected Outcomes   Short Term Goals Other       Long  Term Goals Other       Comments Stuart Harvey feels like his breathing is doing better with his breathing. He still gets short of breath walking at times and doing yard work.       Goals/Expected Outcomes Short: continue to work on breathing techniques when short of breath. Long: maintain exercise to aid in reducing shortness of breath.                Oxygen Discharge (Final Oxygen Re-Evaluation):  Oxygen Re-Evaluation - 02/16/23 1004       Program Oxygen Prescription   Program Oxygen Prescription None      Home Oxygen   Home Oxygen Device None    Sleep Oxygen Prescription None    Home Exercise Oxygen Prescription None    Home Resting Oxygen Prescription None      Goals/Expected Outcomes   Short Term Goals Other    Long  Term Goals Other    Comments Stuart Harvey feels like his breathing is doing better with his breathing. He still gets short of breath walking at times and doing yard work.    Goals/Expected Outcomes Short: continue to work on breathing techniques when short of breath. Long: maintain exercise to aid in reducing shortness of breath.             Initial Exercise Prescription:  Initial Exercise Prescription - 09/22/22 1300       Date of Initial Exercise RX and Referring Provider   Date 09/22/22    Referring Provider [name], MD      Oxygen   Maintain Oxygen Saturation 88% or higher      Treadmill   MPH 2    Grade 0.5    Minutes  15    METs 2.67      Recumbant Bike   Level 1    RPM 50    Watts 12    Minutes 15    METs 2.38      REL-XR   Level 1    Watts 12    Speed 50    Minutes 15    METs 2.38      Prescription Details   Frequency (times per week) 2    Duration Progress to 30 minutes of continuous aerobic without signs/symptoms of physical distress      Intensity   THRR 40-80% of Max Heartrate 99-134    Ratings of Perceived Exertion 11-13    Perceived Dyspnea 0-4      Progression   Progression Continue to progress workloads to maintain intensity without signs/symptoms of physical distress.      Resistance Training   Training Prescription Yes    Weight 4 lb    Reps 10-15             Perform Capillary Blood Glucose checks as needed.  Exercise Prescription Changes:   Exercise Prescription Changes     Row Name 09/22/22 1300 10/10/22 1300 10/24/22 0900 11/01/22 1000 11/08/22 1300     Response to Exercise   Blood Pressure (Admit) 102/60 124/70 112/64 -- 98/56   Blood Pressure (Exercise) 118/76 142/74 146/72 -- 118/62   Blood Pressure (Exit) 108/68 108/70 100/64 -- 96/52   Heart Rate (Admit) 66 bpm 78 bpm 78 bpm -- 94  bpm   Heart Rate (Exercise) 88 bpm 101 bpm 90 bpm -- 130 bpm   Heart Rate (Exit) 69 bpm 79 bpm 83 bpm -- 80 bpm   Oxygen Saturation (Admit) 96 % 98 % 97 % -- 96 %   Oxygen Saturation (Exercise) 94 % 94 % 95 % -- 90 %   Oxygen Saturation (Exit) 97 % 96 % 96 % -- 98 %   Rating of Perceived Exertion (Exercise) 11 12 12  -- 12   Perceived Dyspnea (Exercise) 1 2 1  -- 1   Symptoms Hip Pain 4/10 Dizziness none -- none   Comments Results First 3 sessions of rehab -- -- --   Duration -- Progress to 30 minutes of  aerobic without signs/symptoms of physical distress Continue with 30 min of aerobic exercise without signs/symptoms of physical distress. -- Continue with 30 min of aerobic exercise without signs/symptoms of physical distress.   Intensity -- THRR unchanged THRR  unchanged -- THRR unchanged     Progression   Progression -- Continue to progress workloads to maintain intensity without signs/symptoms of physical distress. Continue to progress workloads to maintain intensity without signs/symptoms of physical distress. -- Continue to progress workloads to maintain intensity without signs/symptoms of physical distress.   Average METs -- 2.66 3.24 -- 3.07     Resistance Training   Training Prescription -- Yes Yes -- Yes   Weight -- 4 lb\ 4 lb -- 6 lb   Reps -- 10-15 10-15 -- 10-15     Interval Training   Interval Training -- No No -- No     Treadmill   MPH -- 1.5 2.1 -- 2.1   Grade -- 0 0 -- 0.5   Minutes -- 15 15 -- 15   METs -- 2.15 2.61 -- 2.75     Recumbant Bike   Level -- -- 5 -- 3   Watts -- -- 33 -- 34   Minutes -- -- 15 -- 15   METs -- -- 2.39 -- --     NuStep   Level -- 3 -- -- 4   Minutes -- 15 -- -- 15   METs -- 2 -- -- 3.9     REL-XR   Level -- 1 2 -- 2   Minutes -- 15 15 -- 15   METs -- 3.9 4 -- 3.5     Home Exercise Plan   Plans to continue exercise at -- -- -- Home (comment)  walking Home (comment)  walking   Frequency -- -- -- Add 3 additional days to program exercise sessions. Add 3 additional days to program exercise sessions.   Initial Home Exercises Provided -- -- -- 11/01/22 11/01/22     Oxygen   Maintain Oxygen Saturation -- 88% or higher 88% or higher -- 88% or higher    Row Name 11/21/22 1000 12/05/22 1400 12/19/22 1100 01/02/23 1300 01/16/23 1000     Response to Exercise   Blood Pressure (Admit) 116/70 96/62 104/64 124/64 100/64   Blood Pressure (Exercise) -- -- 120/58 -- --   Blood Pressure (Exit) 110/60 104/70 96/60 104/64 100/60   Heart Rate (Admit) 80 bpm 52 bpm 81 bpm 78 bpm 78 bpm   Heart Rate (Exercise) 93 bpm 96 bpm 98 bpm 120 bpm 96 bpm   Heart Rate (Exit) 80 bpm 93 bpm 85 bpm 89 bpm 79 bpm   Oxygen Saturation (Admit) 98 % 93 % 99 % 97 % 96 %   Oxygen  Saturation (Exercise) 94 % 95 % 97 % 94 %  94 %   Oxygen Saturation (Exit) 98 % 96 % 96 % 96 % 95 %   Rating of Perceived Exertion (Exercise) 11 14 13 13 13    Perceived Dyspnea (Exercise) 0 2 2 2 2    Symptoms dizziness- resolved with rest none SOB SOB SOB   Duration Continue with 30 min of aerobic exercise without signs/symptoms of physical distress. Continue with 30 min of aerobic exercise without signs/symptoms of physical distress. Continue with 30 min of aerobic exercise without signs/symptoms of physical distress. Continue with 30 min of aerobic exercise without signs/symptoms of physical distress. Continue with 30 min of aerobic exercise without signs/symptoms of physical distress.   Intensity THRR unchanged THRR unchanged THRR unchanged THRR unchanged THRR unchanged     Progression   Progression Continue to progress workloads to maintain intensity without signs/symptoms of physical distress. Continue to progress workloads to maintain intensity without signs/symptoms of physical distress. Continue to progress workloads to maintain intensity without signs/symptoms of physical distress. Continue to progress workloads to maintain intensity without signs/symptoms of physical distress. Continue to progress workloads to maintain intensity without signs/symptoms of physical distress.   Average METs 2.93 2.84 3.3 3.92 3.09     Resistance Training   Training Prescription Yes Yes Yes Yes Yes   Weight 6 lb 6 lb 5 lb 6 lb 6 lb   Reps 10-15 10-15 10-15 10-15 10-15     Interval Training   Interval Training No No No No No     Treadmill   MPH 2.1 2 2.2 2.2 2.2   Grade 0 0 1.5 1.5 1.5   Minutes 15 15 15 15 15    METs 2.61 2.53 3.14 3.14 3.14     Recumbant Bike   Level 4 -- 5 -- --   Watts 40 -- 47 -- --   Minutes 15 -- 15 -- --   METs 3.26 -- 3.51 -- --     NuStep   Level -- -- -- 4 3   Minutes -- -- -- 15 15   METs -- -- -- 8.2 5.2     Recumbant Elliptical   Level -- -- -- 1 --   Minutes -- -- -- 15 --   METs -- -- -- 1.8 --      REL-XR   Level -- -- -- 4 4   Minutes -- -- -- 15 15   METs -- -- -- 4.1 3.7     T5 Nustep   Level -- -- -- -- 3   Minutes -- -- -- -- 15   METs -- -- -- -- 2.5     Biostep-RELP   Level -- 2 4 -- --   Minutes -- 15 15 -- --   METs -- 3 4 -- --     Home Exercise Plan   Plans to continue exercise at Home (comment)  walking Home (comment)  walking Home (comment)  walking Home (comment)  walking Home (comment)  walking   Frequency Add 3 additional days to program exercise sessions. Add 3 additional days to program exercise sessions. Add 3 additional days to program exercise sessions. Add 3 additional days to program exercise sessions. Add 3 additional days to program exercise sessions.   Initial Home Exercises Provided 11/01/22 11/01/22 11/01/22 11/01/22 11/01/22     Oxygen   Maintain Oxygen Saturation 88% or higher 88% or higher 88% or higher 88% or higher 88% or  higher    Row Name 02/13/23 1100 02/27/23 1300           Response to Exercise   Blood Pressure (Admit) 124/66 126/70      Blood Pressure (Exit) 102/62 88/52      Heart Rate (Admit) 85 bpm 82 bpm      Heart Rate (Exercise) 106 bpm 102 bpm      Heart Rate (Exit) 93 bpm 87 bpm      Oxygen Saturation (Admit) 99 % 96 %      Oxygen Saturation (Exercise) 96 % 91 %      Oxygen Saturation (Exit) 95 % 97 %      Rating of Perceived Exertion (Exercise) 12 13      Perceived Dyspnea (Exercise) 1 1      Symptoms SOB SOB      Comments return back after being sick --      Duration Continue with 30 min of aerobic exercise without signs/symptoms of physical distress. Continue with 30 min of aerobic exercise without signs/symptoms of physical distress.      Intensity THRR unchanged THRR unchanged        Progression   Progression Continue to progress workloads to maintain intensity without signs/symptoms of physical distress. Continue to progress workloads to maintain intensity without signs/symptoms of physical distress.       Average METs 3.63 3.39        Resistance Training   Training Prescription Yes Yes      Weight 6 lb 6 lb      Reps 10-15 10-15        Interval Training   Interval Training No No        Treadmill   MPH 2.2 2.2      Grade 1.5 2.5      Minutes 15 15      METs 3.14 3.44        Recumbant Bike   Level 5 7      Watts 47 54      Minutes 15 15      METs 3.55 3.77        NuStep   Level -- 3      Minutes -- 15      METs -- 3.2        REL-XR   Level 5 5      Minutes 15 15      METs 6.2 3.5        Home Exercise Plan   Plans to continue exercise at Home (comment)  walking Home (comment)  walking      Frequency Add 3 additional days to program exercise sessions. Add 3 additional days to program exercise sessions.      Initial Home Exercises Provided 11/01/22 11/01/22        Oxygen   Maintain Oxygen Saturation 88% or higher 88% or higher               Exercise Comments:   Exercise Comments     Row Name 09/27/22 1041           Exercise Comments First full day of exercise!  Patient was oriented to gym and equipment including functions, settings, policies, and procedures.  Patient's individual exercise prescription and treatment plan were reviewed.  All starting workloads were established based on the results of the 6 minute walk test done at initial orientation visit.  The plan for exercise progression was also introduced and progression will be  customized based on patient's performance and goals.                Exercise Goals and Review:   Exercise Goals     Row Name 09/22/22 1334             Exercise Goals   Increase Physical Activity Yes       Intervention Provide advice, education, support and counseling about physical activity/exercise needs.;Develop an individualized exercise prescription for aerobic and resistive training based on initial evaluation findings, risk stratification, comorbidities and participant's personal goals.       Expected Outcomes  Short Term: Attend rehab on a regular basis to increase amount of physical activity.;Long Term: Add in home exercise to make exercise part of routine and to increase amount of physical activity.;Long Term: Exercising regularly at least 3-5 days a week.       Increase Strength and Stamina Yes       Intervention Provide advice, education, support and counseling about physical activity/exercise needs.;Develop an individualized exercise prescription for aerobic and resistive training based on initial evaluation findings, risk stratification, comorbidities and participant's personal goals.       Expected Outcomes Short Term: Increase workloads from initial exercise prescription for resistance, speed, and METs.;Short Term: Perform resistance training exercises routinely during rehab and add in resistance training at home;Long Term: Improve cardiorespiratory fitness, muscular endurance and strength as measured by increased METs and functional capacity ( )       Able to understand and use rate of perceived exertion (RPE) scale Yes       Intervention Provide education and explanation on how to use RPE scale       Expected Outcomes Short Term: Able to use RPE daily in rehab to express subjective intensity level;Long Term:  Able to use RPE to guide intensity level when exercising independently       Able to understand and use Dyspnea scale Yes       Intervention Provide education and explanation on how to use Dyspnea scale       Expected Outcomes Short Term: Able to use Dyspnea scale daily in rehab to express subjective sense of shortness of breath during exertion;Long Term: Able to use Dyspnea scale to guide intensity level when exercising independently       Knowledge and understanding of Target Heart Rate Range (THRR) Yes       Intervention Provide education and explanation of THRR including how the numbers were predicted and where they are located for reference       Expected Outcomes Long Term: Able to use  THRR to govern intensity when exercising independently;Short Term: Able to state/look up THRR;Short Term: Able to use daily as guideline for intensity in rehab       Able to check pulse independently Yes       Intervention Provide education and demonstration on how to check pulse in carotid and radial arteries.;Review the importance of being able to check your own pulse for safety during independent exercise       Expected Outcomes Short Term: Able to explain why pulse checking is important during independent exercise;Long Term: Able to check pulse independently and accurately       Understanding of Exercise Prescription Yes       Intervention Provide education, explanation, and written materials on patient's individual exercise prescription       Expected Outcomes Short Term: Able to explain program exercise prescription;Long Term: Able to explain home exercise prescription to  exercise independently                Exercise Goals Re-Evaluation :  Exercise Goals Re-Evaluation     Row Name 09/27/22 1041 10/10/22 1340 10/24/22 0906 10/27/22 1033 11/01/22 1002     Exercise Goal Re-Evaluation   Exercise Goals Review Able to understand and use rate of perceived exertion (RPE) scale;Able to understand and use Dyspnea scale;Knowledge and understanding of Target Heart Rate Range (THRR);Understanding of Exercise Prescription Understanding of Exercise Prescription;Increase Physical Activity;Increase Strength and Stamina Understanding of Exercise Prescription;Increase Physical Activity;Increase Strength and Stamina Understanding of Exercise Prescription;Increase Physical Activity;Increase Strength and Stamina Understanding of Exercise Prescription;Increase Physical Activity;Increase Strength and Stamina;Able to understand and use rate of perceived exertion (RPE) scale;Knowledge and understanding of Target Heart Rate Range (THRR);Able to understand and use Dyspnea scale;Able to check pulse independently    Comments Reviewed RPE scale, THR and program prescription with pt today.  Pt voiced understanding and was given a copy of goals to take home. Stuart Harvey is off to a good start in rehab. He had an overall average MET level of 2.66 METs during his first three sessions. He also did well with level 3 on the T4 and level 1 on the XR. He started on the treadmill at 1.5 mph and no incline. We will continue to monitor his progress in the program. Stuart Harvey continues to do well in rehab. He increased to level 5 on the recumbent bike and worked up to 33 watts! He was also able to increase his speed on the treadmill back up to 2.1 mph, which is closer to where his initial exercise prescription was. We will continue to monitor. Stuart Harvey is doing well in rehab.  He is not really doing exercise at home. He is staying active by doing yard work. He does feel like his strength and stamina are getting better. Reviewed home exercise with pt today.  Pt plans to walk and use weights at home for exercise.  Reviewed THR, pulse, RPE, sign and symptoms, pulse oximetery and when to call 911 or MD.  Also discussed weather considerations and indoor options.  Pt voiced understanding.   Expected Outcomes Short: Use RPE daily to regulate intensity. Long: Follow program prescription in THR. Short: increase speed on the treadmill. Long: Continue to increase strength and stamina. Short: Add on incline to treadmill now that speed is up Long: Continue to increase overall MET level Short: Start to add in more exercise at home Long: Continue to improve stamina Short: wallk more on off days Long: conitnue to exercise independently    Row Name 11/08/22 1321 11/21/22 1047 11/24/22 1005 12/05/22 1433 12/19/22 1122     Exercise Goal Re-Evaluation   Exercise Goals Review Increase Physical Activity;Increase Strength and Stamina;Understanding of Exercise Prescription Increase Physical Activity;Increase Strength and Stamina;Understanding of Exercise Prescription  Increase Physical Activity;Increase Strength and Stamina;Understanding of Exercise Prescription Increase Physical Activity;Increase Strength and Stamina;Understanding of Exercise Prescription Increase Physical Activity;Increase Strength and Stamina;Understanding of Exercise Prescription   Comments Stuart Harvey is doing well in rehab. He has continued to consistently work at a MET level above 3 METs. He has also added incline on the treadmill at 0.5% while maintaining a speed of 2.1 mph. He improved to level 4 on the T4, and has continued to do well with level 2 on the XR. We will continue to monitor his progress. Stuart Harvey has been out of rehab for several sessions due to symptoms of dizziness and lightheadedness. Patient was brought to  his cardiologist office after 1 rehab session due to continuous symptoms of dizziness which we thought was due to his medications as his BP was running low. His doctor saw him and made some medication changes that we hope will make him feel better overall. He did return back to rehab after that for once session and did well. He BP was stable and he felt food. He increased to level 4 on the recumbent bike and worked up to [employer] We will continue to monitor. Stuart Harvey is still having some spells but they did readjust his meds to help.  His anuerysm repair in his leg is still causing a lot of pain for him.  He is able to tolerate some walking, but walking in from parking lot is a hard on him.  He is walking some at home. Stuart Harvey is doing well in rehab. He recently began using the biostep and did well with level 2. He has also continued to do well with 6 lb hand weights for resistance training. He is walking at 2 mph on the treadmill with no incline, but may benefit from increasing his speed. We will continue to monitor his progress. Stuart Harvey continues to do well in rehab. He has been experiencing some low BP but not as often before and is not often symptomatic with them. He has been staying hydrated  and still in contact with his doctor. He increased on his levels on both the Biostep and recumbent bike, specifically where he has worked up to 47 watts! He also increased his workload on the treadmill to a 2.2 mph/1.5% incline. RPEs are staying in appropriate range. We will continue to monitor.   Expected Outcomes Short: Continue to increase workload on the treadmill. Long: Continue to improve strength and stamina. Short: Continue to slowly increase loads and ease back into exercise Long: Continue to build up overall strength and stamina Short: Continue to walk to build stamina. Long: Conitnue to keep eye on leg pain. Short: Try increased speed on treadmill. Long: Continue to walk to build up stamina. Short: Continue to increase incline on treadmill Long: Continue to increase overall MET level and stamina    Row Name 01/02/23 1337 01/16/23 1103 01/30/23 1417 01/31/23 1011 02/13/23 1138     Exercise Goal Re-Evaluation   Exercise Goals Review Increase Physical Activity;Increase Strength and Stamina;Understanding of Exercise Prescription Increase Physical Activity;Increase Strength and Stamina;Understanding of Exercise Prescription Increase Physical Activity;Increase Strength and Stamina;Understanding of Exercise Prescription Increase Physical Activity;Increase Strength and Stamina;Understanding of Exercise Prescription Increase Physical Activity;Increase Strength and Stamina;Understanding of Exercise Prescription   Comments Stuart Harvey is doing well in rehab. He recently increased his overall average MET level to 3.92 METs. He also improved to level 4 on the XR and has consistently walked on the treadmill at a speed of 2.2 mph and an incline of 1.5%. He increased from 5 lb to 6 lb hand weights for resistance training as well. We will continue to monitor his progress in the program. Stuart Harvey continues to do well in rehab. He has stayed consistent at level 3 on the T4 Nustep and level 4 on the XR, he would benefit from  increasing his Nustep level. His oxygen levels are staying above 88% and RPEs are staying in appropriate range. He would also benefit from increasing the treadmill speed beyond 2.2 mph. We will continue to monitor. Stuart Harvey has not attended rehab since the last review due to being sick. We will follow up with patient to see how  he is feeling and set a return date. We will continue to monitor his progress once he returns to the program. Stuart Harvey reports no structured exercise, but stays active in the yard. Reiterated importance of aerobic exercise and the recommendations of 150 minutes of moderate activity per week. Suggested exercises like walking outside as the weather permits and using silver sneakers to attend a gym. Stuart Harvey continues to do well in rehab. He did increase the level on the XR to 5 and worked up to 6.2 METS! He is due for his post next week and we hope to see improvement. We will continue to monitor.   Expected Outcomes Short: Continue to increase treadmill workload. Long: Continue to improve strength and stamina. Short: Increase treadmill speed Long: Continue to increase overall MET level and stamina Short: Return to rehab once recovered from sickness. Long: Continue to improve strength and stamina. Short: work up to 150 minutes of moderate aerobic exercise per week, activities like walking or joining a gym. Long: Continue to improve strength and stamina. Short: Improve on post Long: Continue to increase overall MET level and stamina    Row Name 02/27/23 1401             Exercise Goal Re-Evaluation   Exercise Goals Review Increase Physical Activity;Increase Strength and Stamina;Understanding of Exercise Prescription       Comments Stuart Harvey is doing well in the program and is close to graduating. He recently completed his post and improved by 120 ft! He also increased his treadmill workload by increasing his incline to 2.5% while maintaining a speed of 2.2 mph. He improved to level 7  on the recumbent bike as well. We will continue to monitor his progress until he graduates from the program.       Expected Outcomes Short: Gradute. Long: Continue to improve strength and stamina.                Discharge Exercise Prescription (Final Exercise Prescription Changes):  Exercise Prescription Changes - 02/27/23 1300       Response to Exercise   Blood Pressure (Admit) 126/70    Blood Pressure (Exit) 88/52    Heart Rate (Admit) 82 bpm    Heart Rate (Exercise) 102 bpm    Heart Rate (Exit) 87 bpm    Oxygen Saturation (Admit) 96 %    Oxygen Saturation (Exercise) 91 %    Oxygen Saturation (Exit) 97 %    Rating of Perceived Exertion (Exercise) 13    Perceived Dyspnea (Exercise) 1    Symptoms SOB    Duration Continue with 30 min of aerobic exercise without signs/symptoms of physical distress.    Intensity THRR unchanged      Progression   Progression Continue to progress workloads to maintain intensity without signs/symptoms of physical distress.    Average METs 3.39      Resistance Training   Training Prescription Yes    Weight 6 lb    Reps 10-15      Interval Training   Interval Training No      Treadmill   MPH 2.2    Grade 2.5    Minutes 15    METs 3.44      Recumbant Bike   Level 7    Watts 54    Minutes 15    METs 3.77      NuStep   Level 3    Minutes 15    METs 3.2  REL-XR   Level 5    Minutes 15    METs 3.5      Home Exercise Plan   Plans to continue exercise at Home (comment)   walking   Frequency Add 3 additional days to program exercise sessions.    Initial Home Exercises Provided 11/01/22      Oxygen   Maintain Oxygen Saturation 88% or higher             Nutrition:  Target Goals: Understanding of nutrition guidelines, daily intake of sodium 1500mg , cholesterol 200mg , calories 30% from fat and 7% or less from saturated fats, daily to have 5 or more servings of fruits and vegetables.  Education: All About  Nutrition: -Group instruction provided by verbal, written material, interactive activities, discussions, models, and posters to present general guidelines for heart healthy nutrition including fat, fiber, MyPlate, the role of sodium in heart healthy nutrition, utilization of the nutrition label, and utilization of this knowledge for meal planning. Follow up email sent as well. Written material given at graduation. Flowsheet Row Pulmonary Rehab from 02/02/2023 in [facility]  Education need identified 09/22/22  Date 10/13/22  [Nutrition 2]  Educator [facility]  Instruction Review Code 1- Verbalizes Understanding       Biometrics:  Pre Biometrics - 09/22/22 1334       Pre Biometrics   Height  (1.803 m)    Weight 216 lb 3.2 oz (98.1 kg)    BMI (Calculated) 30.17    Single Leg Stand 5 seconds   R            Post Biometrics - 02/21/23 1033        Post  Biometrics   Height  (1.803 m)    Weight 211 lb 4.8 oz (95.8 kg)    Waist Circumference 41 inches    Hip Circumference 41.5 inches    Waist to Hip Ratio 0.99 %    BMI (Calculated) 29.48    Single Leg Stand --   hips hurting too much today            Nutrition Therapy Plan and Nutrition Goals:  Nutrition Therapy & Goals - 09/22/22 1049       Nutrition Therapy   Diet Heart healthy, low Na, CKD stg 3 MNT    Drug/Food Interactions Statins/Certain Fruits    Protein (specify units) 85-95g    Fiber 30 grams    Whole Grain Foods 3 servings   Depending on labwork   Saturated Fats 16 max. grams    Fruits and Vegetables 8 servings/day    Sodium 1.5 grams      Personal Nutrition Goals   Nutrition Goal ST: practice MyPlate guidelines, focus on variety of plant foods LT: limit saturated fat <16g/day, limit sodium intake <1.5g/day, eat at least 30g of fiber per day, alter potassium and phosphorus intake based of of labwork for CKD.    Comments 72 y.o. M admitted to pulmonary rehab for chronic HF. PMHx  includes CAD, MI 2008, HLD, HTN, CKD stg 3, AAA s/p EVAR (2008), paroxysmal A-fib, HFrEF, hypothyroidism, current tobacco use (dip), arthritis, depression, GERD, diverticulosis. PSHx includes CABG 2008, EVAR 2008 and 2014, coronary/graft angiography 2019, endovascular/stent graft 2022, bilateral TKA 2017. Relevant medications includes bupropion, vit D3, citalopram, synthroid, melatonin, omeprazole, rosuvastatin. Last GFR 08/29/22 was 42. PYP Score: 56. Vegetables & Fruits 5/12. Breads, Grains & Cereals 5/12. Red & Processed Meat 7/12. Poultry 2/2. Fish & Shellfish 0/4. Beans,  Nuts & Seeds 3/4. Milk & Dairy Foods 3/6. Toppings, Oils, Seasonings & Salt 17/20. Sweets, Snacks & Restaurant Food 7/14. Beverages 7/10. He reports watching sodium B: does not usually have breakfast L: low sodium ham/turkey on sandwich (white bread) D: pork chops, chicken, spaghetti, roast beef. he likes to have baked potatoes and white beans. He reports having red meat <2x/week. He rpeorts his wife will cook dinner. He reports his wife uses liquid plant oils. Drinks: 2 cups of coffee (2 sugars), water, sometimes sweet tea or pepsi (1-2x/day). He reports having salad every meal (cucumbers, lettuce, tomato, some cheese, [place] (2 tbsp). He reports that his wife is a great support system for him and makes sure to watch what he eats. Discussed heart healthy eating and CKD stg 3 MNT.      Intervention Plan   Intervention Prescribe, educate and counsel regarding individualized specific dietary modifications aiming towards targeted core components such as weight, hypertension, lipid management, diabetes, heart failure and other comorbidities.;Nutrition handout(s) given to patient.    Expected Outcomes Short Term Goal: Understand basic principles of dietary content, such as calories, fat, sodium, cholesterol and nutrients.;Short Term Goal: A plan has been developed with personal nutrition goals set during dietitian appointment.;Long Term  Goal: Adherence to prescribed nutrition plan.             Nutrition Assessments:  MEDIFICTS Score Key: ?70 Need to make dietary changes  40-70 Heart Healthy Diet ? 40 Therapeutic Level Cholesterol Diet  Flowsheet Row Pulmonary Rehab from 09/22/2022 in [facility]  Picture Your Plate Total Score on Admission 56      Picture Your Plate Scores: <98 Unhealthy dietary pattern with much room for improvement. 41-50 Dietary pattern unlikely to meet recommendations for good health and room for improvement. 51-60 More healthful dietary pattern, with some room for improvement.  >60 Healthy dietary pattern, although there may be some specific behaviors that could be improved.   Nutrition Goals Re-Evaluation:  Nutrition Goals Re-Evaluation     Row Name 10/27/22 1040 11/24/22 1010 01/31/23 1001 02/16/23 1020       Goals   Nutrition Goal ST: practice MyPlate guidelines, focus on variety of plant foods LT: limit saturated fat <16g/day, limit sodium intake <1.5g/day, eat at least 30g of fiber per day, alter potassium and phosphorus intake based of of labwork for CKD. Short: Continue to use MyPlate guidelines Long: Continue to get a good variety ST: practice MyPlate guidelines, focus on variety of plant foods, choose whole grain bread for lunch LT: limit saturated fat <16g/day, limit sodium intake <1.5g/day, eat at least 30g of fiber per day, alter potassium and phosphorus intake based of of labwork for CKD. Watch portion sizes    Comment Stuart Harvey is doing well in rehab.  His diet is going well and he is getting in a good variety.  He is trying to wathin his sodium and sugar intake.  His wife is helping keep him on task with diet. Stuart Harvey is doing well in rehab.  His weight has been steady so he is feeling good with his diet.  His wife continues to make sure that he does not over do sodium or sugar.  They are also trying to get in a lot of variety into his diet. Stuart Harvey reports doing  well with his diet. His wife continues to support him with his diet monitoring his sodium and added sugar as well as practicing MyPlate guidelines. Reiterated new CKD stg 3 guidance  including keeping protein around 60g/day (0.6g/kg/d) and focusing on whole food sources of potassium such as fiber rich grains and vegetables while monitoring bloodwork. Patient was informed on why it is important to maintain a balanced diet when dealing with Respiratory issues. Explained that it takes a lot of energy to breath and when they are short of breath often they will need to have a good diet to help keep up with the calories they are expending for breathing.    Expected Outcome Short: Continue to use MyPlate guidelines Long: Continue to get a good variety Short: Continue to add in more variety Long: continue to focus on healthy eating ST: practice MyPlate guidelines, focus on variety of plant foods, choose whole grain bread for lunch LT: limit saturated fat <16g/day, limit sodium intake <1.5g/day, eat at least 30g of fiber per day, alter potassium and phosphorus intake based of of labwork for CKD. Short: Choose and plan snacks accordingly to patients caloric intake to improve breathing. Long: Maintain a diet independently that meets their caloric intake to aid in daily shortness of breath.             Nutrition Goals Discharge (Final Nutrition Goals Re-Evaluation):  Nutrition Goals Re-Evaluation - 02/16/23 1020       Goals   Nutrition Goal Watch portion sizes    Comment Patient was informed on why it is important to maintain a balanced diet when dealing with Respiratory issues. Explained that it takes a lot of energy to breath and when they are short of breath often they will need to have a good diet to help keep up with the calories they are expending for breathing.    Expected Outcome Short: Choose and plan snacks accordingly to patients caloric intake to improve breathing. Long: Maintain a diet independently  that meets their caloric intake to aid in daily shortness of breath.             Psychosocial: Target Goals: Acknowledge presence or absence of significant depression and/or stress, maximize coping skills, provide positive support system. Participant is able to verbalize types and ability to use techniques and skills needed for reducing stress and depression.   Education: Stress, Anxiety, and Depression - Group verbal and visual presentation to define topics covered.  Reviews how body is impacted by stress, anxiety, and depression.  Also discusses healthy ways to reduce stress and to treat/manage anxiety and depression.  Written material given at graduation. Flowsheet Row Pulmonary Rehab from 02/02/2023 in [facility] Cardiac and Pulmonary Rehab  Date 11/17/22  Educator [facility]  Instruction Review Code 1- [employer] Understanding       Education: Sleep Hygiene -Provides group verbal and written instruction about how sleep can affect your health.  Define sleep hygiene, discuss sleep cycles and impact of sleep habits. Review good sleep hygiene tips.    Initial Review & Psychosocial Screening:  Initial Psych Review & Screening - 09/12/22 1114       Initial Review   Current issues with Current Psychotropic Meds;History of Depression      Family Dynamics   Good Support System? Yes    Comments Stuart Harvey can look to his wife for suppprt. He feels concerned for his health since he also has a kidney disease.      Barriers   Psychosocial barriers to participate in program The patient should benefit from training in stress management and relaxation.      Screening Interventions   Interventions Encouraged to exercise;To provide support and resources with  identified psychosocial needs;Provide feedback about the scores to participant    Expected Outcomes Short Term goal: Utilizing psychosocial counselor, staff and physician to assist with identification of specific Stressors or current issues interfering  with healing process. Setting desired goal for each stressor or current issue identified.;Long Term Goal: Stressors or current issues are controlled or eliminated.;Short Term goal: Identification and review with participant of any Quality of Life or Depression concerns found by scoring the questionnaire.;Long Term goal: The participant improves quality of Life and PHQ9 Scores as seen by post scores and/or verbalization of changes             Quality of Life Scores:  Quality of Life - 02/28/23 1027       Quality of Life Scores   Health/Function Pre 20.27 %    Health/Function Post 22.5 %    Health/Function % Change 11 %    Socioeconomic Pre 22.17 %    Socioeconomic Post 27 %    Socioeconomic % Change  21.79 %    Psych/Spiritual Pre 22.93 %    Psych/Spiritual Post 25.93 %    Psych/Spiritual % Change 13.08 %    Family Pre 28.5 %    Family Post 28.8 %    Family % Change 1.05 %    GLOBAL Pre 22.42 %    GLOBAL Post 25.06 %    GLOBAL % Change 11.78 %            Scores of 19 and below usually indicate a poorer quality of life in these areas.  A difference of  2-3 points is a clinically meaningful difference.  A difference of 2-3 points in the total score of the Quality of Life Index has been associated with significant improvement in overall quality of life, self-image, physical symptoms, and general health in studies assessing change in quality of life.  PHQ-9: Review Flowsheet  More data exists      02/28/2023 01/11/2023 10/11/2022 09/22/2022 08/09/2022  Depression screen PHQ 2/9  Decreased Interest 1 0 1 0 0  Down, Depressed, Hopeless 0 0 0 0 0  PHQ - 2 Score 1 0 1 0 0  Altered sleeping 1 1 1 1  0  Tired, decreased energy 1 1 1 1 1   Change in appetite 0 0 0 0 1  Feeling bad or failure about yourself  0 0 0 0 0  Trouble concentrating 1 0 1 1 0  Moving slowly or fidgety/restless 0 0 0 0 0  Suicidal thoughts 0 0 0 0 0  PHQ-9 Score 4 2 4 3 2   Difficult doing work/chores Not  difficult at all Not difficult at all Not difficult at all Somewhat difficult Not difficult at all   Interpretation of Total Score  Total Score Depression Severity:  1-4 = Minimal depression, 5-9 = Mild depression, 10-14 = Moderate depression, 15-19 = Moderately severe depression, 20-27 = Severe depression   Psychosocial Evaluation and Intervention:  Psychosocial Evaluation - 09/12/22 1115       Psychosocial Evaluation & Interventions   Interventions Encouraged to exercise with the program and follow exercise prescription;Relaxation education;Stress management education    Comments Stuart Harvey can look to his wife for suppprt. He feels concerned for his health since he also has a kidney disease.    Expected Outcomes Short: Start [facility] to help with mood. Long: Maintain a healthy mental state    Continue Psychosocial Services  Follow up required by staff  Psychosocial Re-Evaluation:  Psychosocial Re-Evaluation     Row Name 10/27/22 1037 11/24/22 1007 12/20/22 1013 01/31/23 1007 02/16/23 1021     Psychosocial Re-Evaluation   Current issues with Current Stress Concerns Current Stress Concerns Current Stress Concerns;Current Psychotropic Meds;History of Depression Current Stress Concerns;Current Psychotropic Meds;History of Depression Current Stress Concerns;Current Psychotropic Meds;History of Depression   Comments Stuart Harvey is doing well in rehab.  He had a tough week as he was awaiting results from his PET scan but her got good results and they did not find any cancer!!  He was very excited with these results!  He continues to sleep well ad has a good support system at home. Stuart Harvey had some med changes to help with dizzy spells and is doing better.  He is in the donut hole with insurance and trying to pay for his medicaitons.  He continues to have pain from anuersym repair that he is constantly dealing wiht.  He sleeps good some night and not so good other nights.  He feels decent with  what he is able to get. Stuart Harvey feels less dizzy and more improved since he is switching his medications up. He is going to talk to his doctor about his blood pressure medications and possible cut the doses down. His blood pressure being low was getting to his mood. We checked his blood pressure manualy agains his automatic cuff. The readings were the same. He did not take his blood pressure medication to see if he would not be so low and  dizzy. He is talking to his doctor and wants to continue getting better. Stuart Harvey reports sleeping well for the most part and reports no stressors at this time. He enjoys yardwork to relax him and is excited for spring weather. He relies on his wife and sons who live close for support. He reports thats he is not experiencing dizziness anymore with medications. Stuart Harvey states that his wife and him walk together and she helps him keep up with his health. He sometimes feels a little depression but is able to manage it with medication and exercise.   Expected Outcomes Short: Continue to exercise for mental boost long; Continue to stay positive Short: Continue to manage pain Long: Continue to exercise for mental boost Short: Continue to manage BP medications. Long: Continue to maintain BP medcations and mood independently. Short: Continue to attend pulmonary rehab for mental health boost Long: Continue to maintain positive attitude Short: Continue to attend pulmonary rehab to reduce stress. Long: Continue to maintain positive outlook on life.   Interventions Encouraged to attend Pulmonary Rehabilitation for the exercise Encouraged to attend Pulmonary Rehabilitation for the exercise Encouraged to attend Pulmonary Rehabilitation for the exercise Encouraged to attend Pulmonary Rehabilitation for the exercise Encouraged to attend Pulmonary Rehabilitation for the exercise   Continue Psychosocial Services  Follow up required by staff Follow up required by staff Follow up required by staff Follow  up required by staff Follow up required by staff            Psychosocial Discharge (Final Psychosocial Re-Evaluation):  Psychosocial Re-Evaluation - 02/16/23 1021       Psychosocial Re-Evaluation   Current issues with Current Stress Concerns;Current Psychotropic Meds;History of Depression    Comments Stuart Harvey states that his wife and him walk together and she helps him keep up with his health. He sometimes feels a little depression but is able to manage it with medication and exercise.    Expected Outcomes Short: Continue to attend  pulmonary rehab to reduce stress. Long: Continue to maintain positive outlook on life.    Interventions Encouraged to attend Pulmonary Rehabilitation for the exercise    Continue Psychosocial Services  Follow up required by staff             Education: Education Goals: Education classes will be provided on a weekly basis, covering required topics. Participant will state understanding/return demonstration of topics presented.  Learning Barriers/Preferences:  Learning Barriers/Preferences - 09/12/22 1113       Learning Barriers/Preferences   Learning Barriers None    Learning Preferences None             General Pulmonary Education Topics:  Infection Prevention: - Provides verbal and written material to individual with discussion of infection control including proper hand washing and proper equipment cleaning during exercise session. Flowsheet Row Pulmonary Rehab from 02/02/2023 in [facility] Cardiac and Pulmonary Rehab  Date 09/12/22  Educator [facility]  Instruction Review Code 1- Verbalizes Understanding       Falls Prevention: - Provides verbal and written material to individual with discussion of falls prevention and safety. Flowsheet Row Pulmonary Rehab from 02/02/2023 in [facility] Cardiac and Pulmonary Rehab  Date 09/12/22  Educator [facility]  Instruction Review Code 1- Verbalizes Understanding       Chronic Lung Disease Review: - Group verbal  instruction with posters, models, PowerPoint presentations and videos,  to review new updates, new respiratory medications, new advancements in procedures and treatments. Providing information on websites and "800" numbers for continued self-education. Includes information about supplement oxygen, available portable oxygen systems, continuous and intermittent flow rates, oxygen safety, concentrators, and Medicare reimbursement for oxygen. Explanation of Pulmonary Drugs, including class, frequency, complications, importance of spacers, rinsing mouth after steroid MDI's, and proper cleaning methods for nebulizers. Review of basic lung anatomy and physiology related to function, structure, and complications of lung disease. Review of risk factors. Discussion about methods for diagnosing sleep apnea and types of masks and machines for OSA. Includes a review of the use of types of environmental controls: home humidity, furnaces, filters, dust mite/pet prevention, HEPA vacuums. Discussion about weather changes, air quality and the benefits of nasal washing. Instruction on Warning signs, infection symptoms, calling MD promptly, preventive modes, and value of vaccinations. Review of effective airway clearance, coughing and/or vibration techniques. Emphasizing that all should Create an Action Plan. Written material given at graduation.   AED/CPR: - Group verbal and written instruction with the use of models to demonstrate the basic use of the AED with the basic ABC's of resuscitation.    Anatomy and Cardiac Procedures: - Group verbal and visual presentation and models provide information about basic cardiac anatomy and function. Reviews the testing methods done to diagnose heart disease and the outcomes of the test results. Describes the treatment choices: Medical Management, Angioplasty, or Coronary Bypass Surgery for treating various heart conditions including Myocardial Infarction, Angina, Valve Disease, and  Cardiac Arrhythmias.  Written material given at graduation. Flowsheet Row Pulmonary Rehab from 02/02/2023 in [facility] Cardiac and Pulmonary Rehab  Education need identified 09/22/22       Medication Safety: - Group verbal and visual instruction to review commonly prescribed medications for heart and lung disease. Reviews the medication, class of the drug, and side effects. Includes the steps to properly store meds and maintain the prescription regimen.  Written material given at graduation.   Other: -Provides group and verbal instruction on various topics (see comments)   Knowledge Questionnaire Score:  Knowledge Questionnaire Score -  02/28/23 1033       Knowledge Questionnaire Score   Pre Score 22/26    Post Score 24/26              Core Components/Risk Factors/Patient Goals at Admission:  Personal Goals and Risk Factors at Admission - 09/22/22 1336       Core Components/Risk Factors/Patient Goals on Admission    Weight Management Yes;Weight Maintenance    Intervention Weight Management: Develop a combined nutrition and exercise program designed to reach desired caloric intake, while maintaining appropriate intake of nutrient and fiber, sodium and fats, and appropriate energy expenditure required for the weight goal.;Weight Management: Provide education and appropriate resources to help participant work on and attain dietary goals.;Weight Management/Obesity: Establish reasonable short term and long term weight goals.    Admit Weight 216 lb 3.2 oz (98.1 kg)    Goal Weight: Short Term 210 lb (95.3 kg)    Goal Weight: Long Term 200 lb (90.7 kg)    Expected Outcomes Weight Maintenance: Understanding of the daily nutrition guidelines, which includes 25-35% calories from fat, 7% or less cal from saturated fats, less than 200mg  cholesterol, less than 1.5gm of sodium, & 5 or more servings of fruits and vegetables daily;Long Term: Adherence to nutrition and physical activity/exercise  program aimed toward attainment of established weight goal;Short Term: Continue to assess and modify interventions until short term weight is achieved;Understanding recommendations for meals to include 15-35% energy as protein, 25-35% energy from fat, 35-60% energy from carbohydrates, less than 200mg  of dietary cholesterol, 20-35 gm of total fiber daily;Understanding of distribution of calorie intake throughout the day with the consumption of 4-5 meals/snacks    Improve shortness of breath with ADL's Yes    Intervention Provide education, individualized exercise plan and daily activity instruction to help decrease symptoms of SOB with activities of daily living.    Expected Outcomes Short Term: Improve cardiorespiratory fitness to achieve a reduction of symptoms when performing ADLs;Long Term: Be able to perform more ADLs without symptoms or delay the onset of symptoms    Hypertension Yes    Intervention Provide education on lifestyle modifcations including regular physical activity/exercise, weight management, moderate sodium restriction and increased consumption of fresh fruit, vegetables, and low fat dairy, alcohol moderation, and smoking cessation.;Monitor prescription use compliance.    Expected Outcomes Short Term: Continued assessment and intervention until BP is < 140/32mm HG in hypertensive participants. < 130/62mm HG in hypertensive participants with diabetes, heart failure or chronic kidney disease.;Long Term: Maintenance of blood pressure at goal levels.    Lipids Yes    Intervention Provide education and support for participant on nutrition & aerobic/resistive exercise along with prescribed medications to achieve LDL 70mg , HDL >40mg .    Expected Outcomes Short Term: Participant states understanding of desired cholesterol values and is compliant with medications prescribed. Participant is following exercise prescription and nutrition guidelines.;Long Term: Cholesterol controlled with  medications as prescribed, with individualized exercise RX and with personalized nutrition plan. Value goals: LDL < 70mg , HDL > 40 mg.             Education:Diabetes - Individual verbal and written instruction to review signs/symptoms of diabetes, desired ranges of glucose level fasting, after meals and with exercise. Acknowledge that pre and post exercise glucose checks will be done for 3 sessions at entry of program.   Know Your Numbers and Heart Failure: - Group verbal and visual instruction to discuss disease risk factors for cardiac and pulmonary disease and treatment  options.  Reviews associated critical values for Overweight/Obesity, Hypertension, Cholesterol, and Diabetes.  Discusses basics of heart failure: signs/symptoms and treatments.  Introduces Heart Failure Zone chart for action plan for heart failure.  Written material given at graduation.   Core Components/Risk Factors/Patient Goals Review:   Goals and Risk Factor Review     Row Name 10/27/22 1041 11/24/22 1002 12/20/22 1012 01/31/23 0959 02/16/23 1023     Core Components/Risk Factors/Patient Goals Review   Personal Goals Review Weight Management/Obesity;Increase knowledge of respiratory medications and ability to use respiratory devices properly.;Hypertension;Improve shortness of breath with ADL's Weight Management/Obesity;Increase knowledge of respiratory medications and ability to use respiratory devices properly.;Hypertension;Improve shortness of breath with ADL's Improve shortness of breath with ADL's Improve shortness of breath with ADL's;Hypertension;Weight Management/Obesity;Lipids Improve shortness of breath with ADL's   Review Stuart Harvey is doing well in rehab.  His weight is steady.  Pressures are doing well, they have always been on the lower end of normal. His breathing is doing okay.  It has gotten better since starting.  He is doing well with his meds. Stuart Harvey is doing well in rehab.  He has been working with his  doctor to balance out his pressures.  They decreased his metoprolol and it seems to be helping some.  He still feels like it is running low but has not had as many dizzy spells.  He is still having some SOB but it is getting better.  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 His weight is still steady. Spoke to patient about their shortness of breath and what they can do to improve. Patient has been informed of breathing techniques when starting the program. Patient is informed to tell staff if they have had any med changes and that certain meds they are taking or not taking can be causing shortness of breath. Stuart Harvey reports having a cold with fever recently so he hasn't done much recently. Stuart Harvey reports that aside from being sick his shortness of breath has been getting better. Stuart Harvey reports checking his BP at home 110-130/70-80s. He reports taking his medication as prescribed with no issues at this time. Stuart Harvey reports his weight has been stable. Stuart Harvey recently got labwork done in february where his A1C was 6.1 and his lipid panel came back normal. Stuart Harvey states that exercise is helping his shortness of breath even though he still has some when doing yard work. Walking is touch for him because it gives him alot of pain. He can tolerate the treadmill more since he can hold himself up.   Expected Outcomes SHort: Conitnue to keep close eye on BP to not get too low Long; Conitnue to montior risk factors Short: Conitnue to monitor BP closely Long; Continue to monitor risk factors. Short: Attend [facility] regularly to improve shortness of breath with ADL's. Long: maintain independence with ADL's Short: Attend [facility] regularly to improve shortness of breath with ADL's. Long: maintain independence with ADL's, manage risk factors independently Short: maintan weight and breathing techniques. Long: continue to improve upon ADL's post [facility].            Core Components/Risk  Factors/Patient Goals at Discharge (Final Review):   Goals and Risk Factor Review - 02/16/23 1023       Core Components/Risk Factors/Patient Goals Review   Personal Goals Review Improve shortness of breath with ADL's    Review Stuart Harvey states that exercise is helping his shortness of breath even though he still has some when  doing yard work. Walking is touch for him because it gives him alot of pain. He can tolerate the treadmill more since he can hold himself up.    Expected Outcomes Short: maintan weight and breathing techniques. Long: continue to improve upon ADL's post [facility].             ITP Comments:  ITP Comments     Row Name 09/12/22 1111 09/22/22 1105 09/27/22 1041 10/12/22 0818 11/09/22 1031   ITP Comments Virtual Visit completed. Patient informed on EP and RD appointment and 6 Minute walk test. Patient also informed of patient health questionnaires on My Chart. Patient Verbalizes understanding. Visit diagnosis can be found in [facility] 08/26/[facility]22. Completed and gym orientation. Initial ITP created and sent for review to Dr. Bethann Punches, Medical Director. First full day of exercise!  Patient was oriented to gym and equipment including functions, settings, policies, and procedures.  Patient's individual exercise prescription and treatment plan were reviewed.  All starting workloads were established based on the results of the 6 minute walk test done at initial orientation visit.  The plan for exercise progression was also introduced and progression will be customized based on patient's performance and goals. 30 Day review completed. Medical Director ITP review done, changes made as directed, and signed approval by Medical Director.   New to program 30 Day review completed. Medical Director ITP review done, changes made as directed, and signed approval by Medical Director.    Row Name 12/07/22 9604 01/04/23 1419 02/01/23 0912 03/01/23 1328 03/09/23 1116   ITP Comments 30 Day review  completed. Medical Director ITP review done, changes made as directed, and signed approval by Medical Director. 30 day review completed. ITP sent to Dr. Jinny Sanders, Medical Director of  Pulmonary Rehab. Continue with ITP unless changes are made by physician. 30 Day review completed. Medical Director ITP review done, changes made as directed, and signed approval by Medical Director. 30 day review completed. ITP sent to Dr. Jinny Sanders, Medical Director of  Pulmonary Rehab. Continue with ITP unless changes are made by physician. Stuart Harvey graduated today from  rehab with 36 sessions completed.  Details of the patient's exercise prescription and what He needs to do in order to continue the prescription and progress were discussed with patient.  Patient was given a copy of prescription and goals.  Patient verbalized understanding.  Taeveon plans to continue to exercise by walking.            Comments: Discharge ITP

## 2023-03-09 NOTE — Progress Notes (Signed)
Discharge Summary: Stuart Harvey (DOB: 09-13-1951)  Stuart Harvey graduated today from  rehab with 36 sessions completed.  Details of the patient's exercise prescription and what He needs to do in order to continue the prescription and progress were discussed with patient.  Patient was given a copy of prescription and goals.  Patient verbalized understanding.  Stuart Harvey plans to continue to exercise by walking.   6 Minute Walk     Row Name 11/21/22 1338 02/21/23 1030       6 Minute Walk   Phase Initial  Completed on 09/22/2022 Discharge    Distance 1215 feet 1335 feet    Distance % Change -- 9.8 %    Distance Feet Change -- 120 ft    Walk Time 6 minutes 6 minutes    # of Rest Breaks 0 0    MPH 2.3 2.53    METS 2.38 2.85    RPE 11 13    Perceived Dyspnea  1 1    VO2 Peak 8.35 9.98    Symptoms Yes (comment) Yes (comment)    Comments Hip pain 4/10 Hip pain 6/10    Resting HR 66 bpm 87 bpm    Resting BP 102/60 124/60    Resting Oxygen Saturation  96 % 97 %    Exercise Oxygen Saturation  during 6 min walk 94 % 94 %    Max Ex. HR 88 bpm 102 bpm    Max Ex. BP 118/76 136/74    2 Minute Post BP 108/68 122/60      Interval HR   1 Minute HR -- 96    2 Minute HR -- 98    3 Minute HR -- 97    4 Minute HR -- 98    5 Minute HR -- 102    6 Minute HR -- 99    2 Minute Post HR -- 93    Interval Heart Rate? -- Yes      Interval Oxygen   Interval Oxygen? -- Yes    Baseline Oxygen Saturation % -- 97 %    1 Minute Oxygen Saturation % -- 94 %    1 Minute Liters of Oxygen -- 0 L  Room Air    2 Minute Oxygen Saturation % -- 94 %    2 Minute Liters of Oxygen -- 0 L    3 Minute Oxygen Saturation % -- 96 %    3 Minute Liters of Oxygen -- 0 L    4 Minute Oxygen Saturation % -- 96 %    4 Minute Liters of Oxygen -- 0 L    5 Minute Oxygen Saturation % -- 94 %    5 Minute Liters of Oxygen -- 0 L    6 Minute Oxygen Saturation % -- 96 %    6 Minute Liters of Oxygen -- 0 L    2 Minute Post Oxygen  Saturation % -- 96 %    2 Minute Post Liters of Oxygen -- 0 L

## 2023-03-28 ENCOUNTER — Other Ambulatory Visit: Payer: Self-pay | Admitting: Nurse Practitioner

## 2023-03-28 NOTE — Telephone Encounter (Signed)
Requested Prescriptions  Pending Prescriptions Disp Refills   ezetimibe (ZETIA) 10 MG tablet [Pharmacy Med Name: EZETIMIBE 10 MG TABLET] 90 tablet 1    Sig: Take 1 tablet (10 mg total) by mouth daily.     Cardiovascular:  Antilipid - Sterol Transport Inhibitors Failed - 03/28/2023 11:11 AM      Failed - Lipid Panel in normal range within the last 12 months    Cholesterol, Total  Date Value Ref Range Status  01/11/2023 146 100 - 199 mg/dL Final   Cholesterol Piccolo, Waived  Date Value Ref Range Status  12/18/2015 159 <200 mg/dL Final    Comment:                            Desirable                <200                         Borderline High      200- 239                         High                     >239    LDL Chol Calc (NIH)  Date Value Ref Range Status  01/11/2023 76 0 - 99 mg/dL Final   HDL  Date Value Ref Range Status  01/11/2023 52 >39 mg/dL Final   Triglycerides  Date Value Ref Range Status  01/11/2023 99 0 - 149 mg/dL Final   Triglycerides Piccolo,Waived  Date Value Ref Range Status  12/18/2015 166 (H) <150 mg/dL Final    Comment:                            Normal                   <150                         Borderline High     150 - 199                         High                200 - 499                         Very High                >499          Passed - AST in normal range and within 360 days    AST  Date Value Ref Range Status  10/11/2022 31 0 - 40 IU/L Final   SGOT(AST)  Date Value Ref Range Status  12/13/2014 30 15 - 37 Unit/L Final         Passed - ALT in normal range and within 360 days    ALT  Date Value Ref Range Status  10/11/2022 17 0 - 44 IU/L Final   SGPT (ALT)  Date Value Ref Range Status  12/13/2014 20 14 - 63 U/L Final         Passed - Patient is not pregnant      Passed -  Valid encounter within last 12 months    Recent Outpatient Visits           2 months ago Benign hypertension with CKD (chronic kidney disease)  stage III (HCC)   [place] [facility] Toccopola, Stuart Dandy T, NP   5 months ago Breast pain, left   [place] [facility] Gabriel Cirri, NP   12 months ago Hyperlipidemia, unspecified hyperlipidemia type   [place] [facility] Harvey, Avanti, MD   1 year ago Screen for colon cancer   [place] [facility] Harvey, Avanti, MD   1 year ago Screening for colon cancer    [facility] Stuart Pardon, MD       Future Appointments             In 3 months Cannady, Stuart Rank, NP  [facility]

## 2023-04-20 ENCOUNTER — Telehealth: Payer: Self-pay | Admitting: Cardiovascular Disease

## 2023-04-20 NOTE — Telephone Encounter (Signed)
Pt made aware and verbalized understanding.

## 2023-04-20 NOTE — Telephone Encounter (Signed)
Pt c/o medication issue:  1. Name of Medication:  Relaxism  2. How are you currently taking this medication (dosage and times per day)?   3. Are you having a reaction (difficulty breathing--STAT)?   4. What is your medication issue?   Patient is interested in starting on this medicationand he would like to confirm Dr. Kirke Corin agrees. Will it interfere with any current medications? Please advise.

## 2023-04-20 NOTE — Telephone Encounter (Signed)
The only Relaxium is can find is a sleep aid which appears like it should be safe. However if patient is having issues with insomnia would also recommend he reach out to his PCP

## 2023-04-25 ENCOUNTER — Other Ambulatory Visit: Payer: Self-pay | Admitting: Nurse Practitioner

## 2023-04-25 ENCOUNTER — Other Ambulatory Visit: Payer: Self-pay | Admitting: Cardiovascular Disease

## 2023-04-26 NOTE — Telephone Encounter (Signed)
Requested Prescriptions  Pending Prescriptions Disp Refills   citalopram (CELEXA) 40 MG tablet [Pharmacy Med Name: CITALOPRAM HBR 40 MG TABLET] 90 tablet 0    Sig: Take 1 tablet (40 mg total) by mouth daily.     Psychiatry:  Antidepressants - SSRI Passed - 04/25/2023  4:53 PM      Passed - Completed PHQ-2 or PHQ-9 in the last 360 days      Passed - Valid encounter within last 6 months    Recent Outpatient Visits           3 months ago Benign hypertension with CKD (chronic kidney disease) stage III (HCC)   [place] [facility] Sunray, Corrie Dandy T, NP   6 months ago Breast pain, left   [facility] Gabriel Cirri, NP   1 year ago Hyperlipidemia, unspecified hyperlipidemia type   [place] [facility] Vigg, Avanti, MD   1 year ago Screen for colon cancer   [place] [facility] Vigg, Avanti, MD   1 year ago Screening for colon cancer   [place] [facility] Loura Pardon, MD       Future Appointments             In 2 months Cannady, Dorie Rank, NP  [facility]

## 2023-05-18 ENCOUNTER — Encounter: Payer: Self-pay | Admitting: Emergency Medicine

## 2023-05-18 ENCOUNTER — Emergency Department: Payer: Medicare HMO

## 2023-05-18 ENCOUNTER — Inpatient Hospital Stay
Admission: EM | Admit: 2023-05-18 | Discharge: 2023-05-25 | DRG: 308 | Disposition: A | Discharge status: Other Acute Inpatient Hospital | Payer: Medicare HMO | Attending: Internal Medicine | Admitting: Internal Medicine

## 2023-05-18 ENCOUNTER — Inpatient Hospital Stay: Payer: Medicare HMO

## 2023-05-18 ENCOUNTER — Other Ambulatory Visit: Payer: Self-pay

## 2023-05-18 DIAGNOSIS — J449 Chronic obstructive pulmonary disease, unspecified: Secondary | ICD-10-CM | POA: Diagnosis present

## 2023-05-18 DIAGNOSIS — R000 Tachycardia, unspecified: Secondary | ICD-10-CM | POA: Diagnosis present

## 2023-05-18 DIAGNOSIS — R230 Cyanosis: Secondary | ICD-10-CM | POA: Diagnosis present

## 2023-05-18 DIAGNOSIS — Z96653 Presence of artificial knee joint, bilateral: Secondary | ICD-10-CM | POA: Diagnosis present

## 2023-05-18 DIAGNOSIS — F32A Depression, unspecified: Secondary | ICD-10-CM | POA: Diagnosis present

## 2023-05-18 DIAGNOSIS — T40415A Adverse effect of fentanyl or fentanyl analogs, initial encounter: Secondary | ICD-10-CM | POA: Diagnosis present

## 2023-05-18 DIAGNOSIS — I499 Cardiac arrhythmia, unspecified: Principal | ICD-10-CM | POA: Diagnosis present

## 2023-05-18 DIAGNOSIS — R578 Other shock: Secondary | ICD-10-CM | POA: Diagnosis present

## 2023-05-18 DIAGNOSIS — N179 Acute kidney failure, unspecified: Secondary | ICD-10-CM | POA: Diagnosis present

## 2023-05-18 DIAGNOSIS — Z8616 Personal history of COVID-19: Secondary | ICD-10-CM

## 2023-05-18 DIAGNOSIS — I483 Typical atrial flutter: Secondary | ICD-10-CM | POA: Diagnosis present

## 2023-05-18 DIAGNOSIS — D696 Thrombocytopenia, unspecified: Secondary | ICD-10-CM | POA: Diagnosis present

## 2023-05-18 DIAGNOSIS — Z1152 Encounter for screening for COVID-19: Secondary | ICD-10-CM

## 2023-05-18 DIAGNOSIS — I959 Hypotension, unspecified: Secondary | ICD-10-CM | POA: Diagnosis not present

## 2023-05-18 DIAGNOSIS — I2489 Other forms of acute ischemic heart disease: Secondary | ICD-10-CM | POA: Diagnosis present

## 2023-05-18 DIAGNOSIS — Z8249 Family history of ischemic heart disease and other diseases of the circulatory system: Secondary | ICD-10-CM

## 2023-05-18 DIAGNOSIS — I25810 Atherosclerosis of coronary artery bypass graft(s) without angina pectoris: Secondary | ICD-10-CM | POA: Diagnosis present

## 2023-05-18 DIAGNOSIS — Z841 Family history of disorders of kidney and ureter: Secondary | ICD-10-CM

## 2023-05-18 DIAGNOSIS — F419 Anxiety disorder, unspecified: Secondary | ICD-10-CM | POA: Diagnosis present

## 2023-05-18 DIAGNOSIS — Z7901 Long term (current) use of anticoagulants: Secondary | ICD-10-CM

## 2023-05-18 DIAGNOSIS — E875 Hyperkalemia: Secondary | ICD-10-CM | POA: Diagnosis present

## 2023-05-18 DIAGNOSIS — I5020 Unspecified systolic (congestive) heart failure: Secondary | ICD-10-CM | POA: Diagnosis present

## 2023-05-18 DIAGNOSIS — M1990 Unspecified osteoarthritis, unspecified site: Secondary | ICD-10-CM | POA: Diagnosis present

## 2023-05-18 DIAGNOSIS — I2510 Atherosclerotic heart disease of native coronary artery without angina pectoris: Secondary | ICD-10-CM | POA: Diagnosis present

## 2023-05-18 DIAGNOSIS — J9601 Acute respiratory failure with hypoxia: Secondary | ICD-10-CM | POA: Diagnosis present

## 2023-05-18 DIAGNOSIS — J690 Pneumonitis due to inhalation of food and vomit: Secondary | ICD-10-CM | POA: Diagnosis present

## 2023-05-18 DIAGNOSIS — Z79899 Other long term (current) drug therapy: Secondary | ICD-10-CM

## 2023-05-18 DIAGNOSIS — I480 Paroxysmal atrial fibrillation: Secondary | ICD-10-CM | POA: Diagnosis present

## 2023-05-18 DIAGNOSIS — I5023 Acute on chronic systolic (congestive) heart failure: Secondary | ICD-10-CM | POA: Diagnosis present

## 2023-05-18 DIAGNOSIS — Z8673 Personal history of transient ischemic attack (TIA), and cerebral infarction without residual deficits: Secondary | ICD-10-CM

## 2023-05-18 DIAGNOSIS — R911 Solitary pulmonary nodule: Secondary | ICD-10-CM | POA: Diagnosis present

## 2023-05-18 DIAGNOSIS — G928 Other toxic encephalopathy: Secondary | ICD-10-CM | POA: Diagnosis present

## 2023-05-18 DIAGNOSIS — T41295A Adverse effect of other general anesthetics, initial encounter: Secondary | ICD-10-CM | POA: Diagnosis present

## 2023-05-18 DIAGNOSIS — W1830XA Fall on same level, unspecified, initial encounter: Secondary | ICD-10-CM | POA: Diagnosis present

## 2023-05-18 DIAGNOSIS — I255 Ischemic cardiomyopathy: Secondary | ICD-10-CM | POA: Diagnosis present

## 2023-05-18 DIAGNOSIS — Z8679 Personal history of other diseases of the circulatory system: Secondary | ICD-10-CM

## 2023-05-18 DIAGNOSIS — I071 Rheumatic tricuspid insufficiency: Secondary | ICD-10-CM | POA: Diagnosis present

## 2023-05-18 DIAGNOSIS — R4182 Altered mental status, unspecified: Secondary | ICD-10-CM

## 2023-05-18 DIAGNOSIS — Z818 Family history of other mental and behavioral disorders: Secondary | ICD-10-CM

## 2023-05-18 DIAGNOSIS — S2231XA Fracture of one rib, right side, initial encounter for closed fracture: Secondary | ICD-10-CM | POA: Diagnosis present

## 2023-05-18 DIAGNOSIS — E785 Hyperlipidemia, unspecified: Secondary | ICD-10-CM | POA: Diagnosis present

## 2023-05-18 DIAGNOSIS — N1831 Chronic kidney disease, stage 3a: Secondary | ICD-10-CM | POA: Diagnosis present

## 2023-05-18 DIAGNOSIS — I252 Old myocardial infarction: Secondary | ICD-10-CM

## 2023-05-18 DIAGNOSIS — I130 Hypertensive heart and chronic kidney disease with heart failure and stage 1 through stage 4 chronic kidney disease, or unspecified chronic kidney disease: Secondary | ICD-10-CM | POA: Diagnosis present

## 2023-05-18 DIAGNOSIS — E039 Hypothyroidism, unspecified: Secondary | ICD-10-CM | POA: Diagnosis present

## 2023-05-18 DIAGNOSIS — I459 Conduction disorder, unspecified: Secondary | ICD-10-CM | POA: Diagnosis present

## 2023-05-18 DIAGNOSIS — I4811 Longstanding persistent atrial fibrillation: Secondary | ICD-10-CM | POA: Diagnosis not present

## 2023-05-18 DIAGNOSIS — J9600 Acute respiratory failure, unspecified whether with hypoxia or hypercapnia: Secondary | ICD-10-CM | POA: Diagnosis not present

## 2023-05-18 DIAGNOSIS — I469 Cardiac arrest, cause unspecified: Secondary | ICD-10-CM | POA: Diagnosis present

## 2023-05-18 DIAGNOSIS — I5022 Chronic systolic (congestive) heart failure: Secondary | ICD-10-CM | POA: Diagnosis not present

## 2023-05-18 DIAGNOSIS — Z79890 Hormone replacement therapy (postmenopausal): Secondary | ICD-10-CM

## 2023-05-18 DIAGNOSIS — R55 Syncope and collapse: Secondary | ICD-10-CM | POA: Diagnosis not present

## 2023-05-18 DIAGNOSIS — E876 Hypokalemia: Secondary | ICD-10-CM | POA: Diagnosis not present

## 2023-05-18 DIAGNOSIS — E872 Acidosis, unspecified: Secondary | ICD-10-CM | POA: Diagnosis present

## 2023-05-18 DIAGNOSIS — F17290 Nicotine dependence, other tobacco product, uncomplicated: Secondary | ICD-10-CM | POA: Diagnosis present

## 2023-05-18 DIAGNOSIS — N183 Chronic kidney disease, stage 3 unspecified: Secondary | ICD-10-CM | POA: Diagnosis present

## 2023-05-18 DIAGNOSIS — R001 Bradycardia, unspecified: Secondary | ICD-10-CM | POA: Diagnosis not present

## 2023-05-18 DIAGNOSIS — Z8674 Personal history of sudden cardiac arrest: Secondary | ICD-10-CM | POA: Diagnosis not present

## 2023-05-18 DIAGNOSIS — Z888 Allergy status to other drugs, medicaments and biological substances status: Secondary | ICD-10-CM

## 2023-05-18 DIAGNOSIS — K219 Gastro-esophageal reflux disease without esophagitis: Secondary | ICD-10-CM | POA: Diagnosis present

## 2023-05-18 DIAGNOSIS — I4892 Unspecified atrial flutter: Secondary | ICD-10-CM | POA: Diagnosis not present

## 2023-05-18 DIAGNOSIS — Z881 Allergy status to other antibiotic agents status: Secondary | ICD-10-CM

## 2023-05-18 DIAGNOSIS — I471 Supraventricular tachycardia, unspecified: Secondary | ICD-10-CM | POA: Diagnosis not present

## 2023-05-18 DIAGNOSIS — Z885 Allergy status to narcotic agent status: Secondary | ICD-10-CM

## 2023-05-18 HISTORY — DX: Ischemic cardiomyopathy: I25.5

## 2023-05-18 HISTORY — DX: Chronic systolic (congestive) heart failure: I50.22

## 2023-05-18 LAB — SARS CORONAVIRUS 2 BY RT PCR: SARS Coronavirus 2 by RT PCR: NEGATIVE

## 2023-05-18 LAB — URINALYSIS, ROUTINE W REFLEX MICROSCOPIC
Bacteria, UA: NONE SEEN
Bilirubin Urine: NEGATIVE
Glucose, UA: NEGATIVE mg/dL
Hgb urine dipstick: NEGATIVE
Ketones, ur: NEGATIVE mg/dL
Nitrite: NEGATIVE
Protein, ur: 100 mg/dL — AB
Specific Gravity, Urine: 1.019 (ref 1.005–1.030)
WBC, UA: >50 WBC/hpf (ref 0–5)
pH: 5 (ref 5.0–8.0)

## 2023-05-18 LAB — BPAM FFP: Unit Type and Rh: 5100

## 2023-05-18 LAB — CBC WITH DIFFERENTIAL/PLATELET
Abs Immature Granulocytes: 0.27 10*3/uL — ABNORMAL HIGH (ref 0.00–0.07)
Basophils Absolute: 0.1 10*3/uL (ref 0.0–0.1)
Basophils Relative: 0 %
Eosinophils Absolute: 0.5 10*3/uL (ref 0.0–0.5)
Eosinophils Relative: 3 %
HCT: 48.3 % (ref 39.0–52.0)
Hemoglobin: 15.5 g/dL (ref 13.0–17.0)
Immature Granulocytes: 2 %
Lymphocytes Relative: 58 %
Lymphs Abs: 10.5 10*3/uL — ABNORMAL HIGH (ref 0.7–4.0)
MCH: 30.2 pg (ref 26.0–34.0)
MCHC: 32.1 g/dL (ref 30.0–36.0)
MCV: 94.2 fL (ref 80.0–100.0)
Monocytes Absolute: 1 10*3/uL (ref 0.1–1.0)
Monocytes Relative: 5 %
Neutro Abs: 5.9 10*3/uL (ref 1.7–7.7)
Neutrophils Relative %: 32 %
Platelets: 201 10*3/uL (ref 150–400)
RBC: 5.13 MIL/uL (ref 4.22–5.81)
RDW: 14.5 % (ref 11.5–15.5)
WBC: 18.2 10*3/uL — ABNORMAL HIGH (ref 4.0–10.5)
nRBC: 0.2 % (ref 0.0–0.2)

## 2023-05-18 LAB — TYPE AND SCREEN

## 2023-05-18 LAB — TROPONIN I (HIGH SENSITIVITY)
Troponin I (High Sensitivity): 21 ng/L — ABNORMAL HIGH (ref <18)
Troponin I (High Sensitivity): 40 ng/L — ABNORMAL HIGH (ref <18)
Troponin I (High Sensitivity): 101 ng/L (ref <18)

## 2023-05-18 LAB — BASIC METABOLIC PANEL
Anion gap: 7 (ref 5–15)
BUN: 36 mg/dL — ABNORMAL HIGH (ref 8–23)
CO2: 18 mmol/L — ABNORMAL LOW (ref 22–32)
Calcium: 8.3 mg/dL — ABNORMAL LOW (ref 8.9–10.3)
Chloride: 111 mmol/L (ref 98–111)
Creatinine, Ser: 1.69 mg/dL — ABNORMAL HIGH (ref 0.61–1.24)
GFR, Estimated: 43 mL/min — ABNORMAL LOW (ref >60)
Glucose, Bld: 143 mg/dL — ABNORMAL HIGH (ref 70–99)
Potassium: 5.1 mmol/L (ref 3.5–5.1)
Sodium: 136 mmol/L (ref 135–145)

## 2023-05-18 LAB — BLOOD GAS, ARTERIAL
Acid-base deficit: 8.1 mmol/L — ABNORMAL HIGH (ref 0.0–2.0)
FIO2: 50 %
MECHVT: 550 mL
Patient temperature: 37
pH, Arterial: 7.32 — ABNORMAL LOW (ref 7.35–7.45)

## 2023-05-18 LAB — COMPREHENSIVE METABOLIC PANEL
ALT: 20 U/L (ref 0–44)
AST: 43 U/L — ABNORMAL HIGH (ref 15–41)
Albumin: 4.3 g/dL (ref 3.5–5.0)
Alkaline Phosphatase: 103 U/L (ref 38–126)
Anion gap: 17 — ABNORMAL HIGH (ref 5–15)
BUN: 37 mg/dL — ABNORMAL HIGH (ref 8–23)
CO2: 10 mmol/L — ABNORMAL LOW (ref 22–32)
Calcium: 9.7 mg/dL (ref 8.9–10.3)
Chloride: 112 mmol/L — ABNORMAL HIGH (ref 98–111)
Creatinine, Ser: 2.01 mg/dL — ABNORMAL HIGH (ref 0.61–1.24)
GFR, Estimated: 35 mL/min — ABNORMAL LOW (ref >60)
Glucose, Bld: 160 mg/dL — ABNORMAL HIGH (ref 70–99)
Potassium: 5.7 mmol/L — ABNORMAL HIGH (ref 3.5–5.1)
Sodium: 139 mmol/L (ref 135–145)
Total Bilirubin: 1 mg/dL (ref 0.3–1.2)
Total Protein: 7.5 g/dL (ref 6.5–8.1)

## 2023-05-18 LAB — BLOOD GAS, VENOUS
Acid-base deficit: 7 mmol/L — ABNORMAL HIGH (ref 0.0–2.0)
Bicarbonate: 19.2 mmol/L — ABNORMAL LOW (ref 20.0–28.0)
FIO2: 40 %
MECHVT: 550 mL
Mechanical Rate: 15
O2 Saturation: 93 %
PEEP: 8 cmH2O
Patient temperature: 37
pCO2, Ven: 40 mmHg — ABNORMAL LOW (ref 44–60)
pH, Ven: 7.29 (ref 7.25–7.43)
pO2, Ven: 65 mmHg — ABNORMAL HIGH (ref 32–45)

## 2023-05-18 LAB — URINE DRUG SCREEN, QUALITATIVE (ARMC ONLY)
Amphetamines, Ur Screen: NOT DETECTED
Barbiturates, Ur Screen: NOT DETECTED
Benzodiazepine, Ur Scrn: POSITIVE — AB
Cannabinoid 50 Ng, Ur ~~LOC~~: NOT DETECTED
Cocaine Metabolite,Ur ~~LOC~~: NOT DETECTED
MDMA (Ecstasy)Ur Screen: NOT DETECTED
Methadone Scn, Ur: NOT DETECTED
Opiate, Ur Screen: NOT DETECTED
Phencyclidine (PCP) Ur S: NOT DETECTED
Tricyclic, Ur Screen: NOT DETECTED

## 2023-05-18 LAB — PREPARE FRESH FROZEN PLASMA: Unit division: 0

## 2023-05-18 LAB — PROTIME-INR
INR: 5.4 (ref 0.8–1.2)
INR: 1.3 — ABNORMAL HIGH (ref 0.8–1.2)
Prothrombin Time: 49.6 seconds — ABNORMAL HIGH (ref 11.4–15.2)
Prothrombin Time: 16.5 seconds — ABNORMAL HIGH (ref 11.4–15.2)

## 2023-05-18 LAB — SALICYLATE LEVEL: Salicylate Lvl: <7 mg/dL — ABNORMAL LOW (ref 7.0–30.0)

## 2023-05-18 LAB — LACTIC ACID, PLASMA
Lactic Acid, Venous: >9 mmol/L (ref 0.5–1.9)
Lactic Acid, Venous: 1.9 mmol/L (ref 0.5–1.9)

## 2023-05-18 LAB — PHOSPHORUS: Phosphorus: 4.1 mg/dL (ref 2.5–4.6)

## 2023-05-18 LAB — GLUCOSE, CAPILLARY
Glucose-Capillary: 122 mg/dL — ABNORMAL HIGH (ref 70–99)
Glucose-Capillary: 130 mg/dL — ABNORMAL HIGH (ref 70–99)

## 2023-05-18 LAB — BRAIN NATRIURETIC PEPTIDE: B Natriuretic Peptide: 513.5 pg/mL — ABNORMAL HIGH (ref 0.0–100.0)

## 2023-05-18 LAB — ACETAMINOPHEN LEVEL: Acetaminophen (Tylenol), Serum: <10 ug/mL — ABNORMAL LOW (ref 10–30)

## 2023-05-18 LAB — MAGNESIUM: Magnesium: 2.4 mg/dL (ref 1.7–2.4)

## 2023-05-18 MED ORDER — SODIUM CHLORIDE 0.9% IV SOLUTION: Freq: Once | INTRAVENOUS | Status: DC

## 2023-05-18 MED ORDER — NOREPINEPHRINE 4 MG/250ML-% IV SOLN
0.0000 ug/min | INTRAVENOUS | Status: DC
  Administered 2023-05-18: 10 ug/min via INTRAVENOUS

## 2023-05-18 MED ORDER — POLYETHYLENE GLYCOL 3350 17 G PO PACK: 17.0000 g | PACK | Freq: Every day | ORAL | Status: DC | PRN

## 2023-05-18 MED ORDER — SUCCINYLCHOLINE CHLORIDE 20 MG/ML IJ SOLN
INTRAMUSCULAR | Status: AC | PRN
  Administered 2023-05-18: 100 mg via INTRAVENOUS

## 2023-05-18 MED ORDER — DOCUSATE SODIUM 100 MG PO CAPS
100.0000 mg | ORAL_CAPSULE | Freq: Two times a day (BID) | ORAL | Status: DC | PRN
  Administered 2023-05-21: 100 mg via ORAL
  Filled 2023-05-18: qty 1

## 2023-05-18 MED ORDER — NOREPINEPHRINE 4 MG/250ML-% IV SOLN
0.0000 ug/min | INTRAVENOUS | Status: DC
  Administered 2023-05-18: 12 ug/min via INTRAVENOUS
  Filled 2023-05-18: qty 250

## 2023-05-18 MED ORDER — ETOMIDATE 2 MG/ML IV SOLN
INTRAVENOUS | Status: AC | PRN
  Administered 2023-05-18: 30 mg via INTRAVENOUS

## 2023-05-18 MED ORDER — SODIUM CHLORIDE 0.9 % IV BOLUS
1000.0000 mL | Freq: Once | INTRAVENOUS | Status: AC
  Administered 2023-05-18: 1000 mL via INTRAVENOUS

## 2023-05-18 MED ORDER — FENTANYL BOLUS VIA INFUSION: 25.0000 ug | INTRAVENOUS | Status: DC | PRN

## 2023-05-18 MED ORDER — MIDAZOLAM HCL (PF) 10 MG/2ML IJ SOLN
4.0000 mg | INTRAMUSCULAR | Status: AC
  Administered 2023-05-18: 4 mg via INTRAVENOUS

## 2023-05-18 MED ORDER — PANTOPRAZOLE SODIUM 40 MG IV SOLR
40.0000 mg | Freq: Every day | INTRAVENOUS | Status: DC
  Administered 2023-05-18 – 2023-05-19 (×2): 40 mg via INTRAVENOUS
  Filled 2023-05-18 (×2): qty 10

## 2023-05-18 MED ORDER — MIDAZOLAM HCL 2 MG/2ML IJ SOLN: 4.0000 mg | Freq: Once | INTRAMUSCULAR | Status: DC

## 2023-05-18 MED ORDER — FENTANYL CITRATE PF 50 MCG/ML IJ SOSY
PREFILLED_SYRINGE | INTRAMUSCULAR | Status: AC
  Administered 2023-05-18: 100 ug via INTRAVENOUS
  Filled 2023-05-18: qty 2

## 2023-05-18 MED ORDER — SODIUM CHLORIDE 0.9 % IV SOLN
250.0000 mL | INTRAVENOUS | Status: DC
  Administered 2023-05-18: 250 mL via INTRAVENOUS

## 2023-05-18 MED ORDER — NOREPINEPHRINE 4 MG/250ML-% IV SOLN
INTRAVENOUS | Status: AC
  Filled 2023-05-18: qty 250

## 2023-05-18 MED ORDER — LEVOTHYROXINE SODIUM 50 MCG PO TABS
75.0000 ug | ORAL_TABLET | Freq: Every day | ORAL | Status: DC
  Administered 2023-05-19 – 2023-05-25 (×6): 75 ug via ORAL
  Filled 2023-05-18: qty 2
  Filled 2023-05-18 (×3): qty 1
  Filled 2023-05-18 (×2): qty 2

## 2023-05-18 MED ORDER — PROPOFOL 1000 MG/100ML IV EMUL
5.0000 ug/kg/min | INTRAVENOUS | Status: DC
  Administered 2023-05-18 – 2023-05-19 (×2): 30 ug/kg/min via INTRAVENOUS
  Filled 2023-05-18 (×2): qty 100

## 2023-05-18 MED ORDER — DOCUSATE SODIUM 50 MG/5ML PO LIQD
100.0000 mg | Freq: Two times a day (BID) | ORAL | Status: DC
  Administered 2023-05-18 – 2023-05-20 (×4): 100 mg
  Filled 2023-05-18 (×5): qty 10

## 2023-05-18 MED ORDER — POLYETHYLENE GLYCOL 3350 17 G PO PACK
17.0000 g | PACK | Freq: Every day | ORAL | Status: DC
  Administered 2023-05-18 – 2023-05-25 (×7): 17 g
  Filled 2023-05-18 (×7): qty 1

## 2023-05-18 MED ORDER — PROPOFOL 1000 MG/100ML IV EMUL: 10.0000 ug/kg/min | INTRAVENOUS | Status: DC

## 2023-05-18 MED ORDER — MIDAZOLAM HCL (PF) 10 MG/2ML IJ SOLN
INTRAMUSCULAR | Status: AC
  Filled 2023-05-18: qty 2

## 2023-05-18 MED ORDER — PHENYLEPHRINE CONCENTRATED 100MG/250ML (0.4 MG/ML) INFUSION SIMPLE
0.0000 ug/min | INTRAVENOUS | Status: DC
  Administered 2023-05-18: 20 ug/min via INTRAVENOUS
  Filled 2023-05-18 (×2): qty 250

## 2023-05-18 MED ORDER — FENTANYL 2500MCG IN NS 250ML (10MCG/ML) PREMIX INFUSION
0.0000 ug/h | INTRAVENOUS | Status: DC
  Administered 2023-05-18: 25 ug/h via INTRAVENOUS
  Administered 2023-05-18 (×2): 50 ug/h via INTRAVENOUS
  Filled 2023-05-18: qty 250

## 2023-05-18 MED ORDER — PROPOFOL 1000 MG/100ML IV EMUL
INTRAVENOUS | Status: AC
  Administered 2023-05-18: 5 ug/kg/min via INTRAVENOUS
  Filled 2023-05-18: qty 100

## 2023-05-18 MED ORDER — SODIUM CHLORIDE 0.9 % IV SOLN
3.0000 g | Freq: Four times a day (QID) | INTRAVENOUS | Status: AC
  Administered 2023-05-18 – 2023-05-20 (×8): 3 g via INTRAVENOUS
  Filled 2023-05-18 (×8): qty 8

## 2023-05-18 MED ORDER — IOHEXOL 350 MG/ML SOLN
100.0000 mL | Freq: Once | INTRAVENOUS | Status: AC | PRN
  Administered 2023-05-18: 100 mL via INTRAVENOUS

## 2023-05-18 MED ORDER — INSULIN ASPART 100 UNIT/ML IJ SOLN
0.0000 [IU] | INTRAMUSCULAR | Status: DC
  Administered 2023-05-18 – 2023-05-20 (×6): 1 [IU] via SUBCUTANEOUS
  Filled 2023-05-18 (×5): qty 1

## 2023-05-18 MED ORDER — FENTANYL CITRATE PF 50 MCG/ML IJ SOSY: 100.0000 ug | PREFILLED_SYRINGE | Freq: Once | INTRAMUSCULAR | Status: AC

## 2023-05-18 NOTE — Progress Notes (Signed)
An USGPIV (ultrasound guided PIV) has been placed for short-term vasopressor infusion. A correctly placed ivWatch must be used when administering Vasopressors. Should this treatment be needed beyond 72 hours, central line access should be obtained.  It will be the responsibility of the bedside nurse to follow best practice to prevent extravasations.   

## 2023-05-18 NOTE — ED Notes (Signed)
Dr. Marisa Severin at bedside performing bedside cardiac ultrasound

## 2023-05-18 NOTE — Consult Note (Signed)
PHARMACY CONSULT NOTE - FOLLOW UP  Pharmacy Consult for Electrolyte Monitoring and Replacement   Recent Labs: Potassium (mmol/L)  Date Value  05/18/2023 5.7 (H)  02/20/2015 4.5   Magnesium (mg/dL)  Date Value  09/81/1914 1.9  12/06/2012 1.8   Calcium (mg/dL)  Date Value  78/29/5621 9.7   Calcium, Total (mg/dL)  Date Value  30/86/5784 8.8 (L)   Albumin (g/dL)  Date Value  69/62/9528 4.3  10/11/2022 4.1  12/13/2014 3.9   Phosphorus (mg/dL)  Date Value  41/32/4401 2.5  12/06/2012 3.3   Sodium (mmol/L)  Date Value  05/18/2023 139  10/11/2022 141  02/20/2015 135     Assessment: 72 y.o. male with a history of CAD status post CABG, HFrEF, AAA status post surgical repair in 2008, right iliac artery aneurysm status post endovascular repair in 2022, atrial fibrillation on Eliquis, CKD, hypertension, hyperlipidemia, hypothyroidism, anemia, GERD, and depression who presents with altered mental status after out of hospital cardiac arrest w/ ROSC after ~ 4 minutes. Pharmacy has been consulted to monitor and replace electrolytes while in ICU.  Goal of Therapy:  K 4.0-5.2 Mag 2.0  All other electrolytes WNL  Plan:  No additional replacement needed at this time Recheck all electrolytes with AM labs  Sharen Hones, PharmD, BCPS Clinical Pharmacist   05/18/2023 6:29 PM

## 2023-05-18 NOTE — H&P (Signed)
NAME:  Stuart Harvey, MRN:  [id number], DOB:  01/21/1951, LOS: 0 ADMISSION DATE:  05/18/2023, CHIEF COMPLAINT:  Cardiac Arrest   History of Present Illness:   Patient is a 72 year old male brought in by EMS following cardiac arrest at home.  Patient was intubated and sedated at the time of our evaluation. History was obtained through collateral from his wife and ED providers. He was in his usual state of health up until earlier today. His wife found him sitting in their garage and he complained of dizziness. He then lost consciousness and fell on the floor. She activated EMS and initiated bystander CPR. On EMS arrival and continuation of ACLS, ROSC was achieved.  On arrival to the ED, the patient was intubated and was noted to be hypotensive post intubation. He was started on vasopressors as well as propofol and fentnayl for analgosedation. Imaging included head, chest, abdomen, and pelvis CT's did not show any acute findings aside from rib fractures.  PCCM consulted for admission to the ICU.  Pertinent  Medical History   -CAD (s/p 4 vessel CABG in 2008) -history of ventricular tachycardia in 2019 -CKD stage III a -HFrEF -Infrarenal AAA (s/p open repair 2008 and endovascular repair in 2022) -afib on Eliquis -HTN -hypothyroidism  RHC/LHC 07/2022    1.  Significant three-vessel coronary artery disease with patent grafts including LIMA to LAD, SVG to diagonal and SVG to right PDA.  Occluded SVG to OM which is new compared to most recent cardiac catheterization.  However, this is not unexpected given that the graft was diffusely diseased. 2.  Left ventricular angiography was not performed.  EF was moderately reduced by echo. 3.  Right heart catheterization showed normal right and left-sided filling pressures, normal pulmonary pressures and normal cardiac output.   Significant Hospital Events: Including procedures, antibiotic start and stop dates in addition to other pertinent events    05/18/2023: cardiac arrest, intubated, admit to the ICU   Objective   Blood pressure 114/60, pulse 94, temperature 98.1 F (36.7 C), temperature source Axillary, resp. rate 16, height 6' (1.829 m), weight 113.5 kg, SpO2 99 %.    Vent Mode: AC FiO2 (%):  [40 %-50 %] 40 % Set Rate:  [15 bmp] 15 bmp Vt Set:  [550 mL] 550 mL PEEP:  [8 cmH20] 8 cmH20   Intake/Output Summary (Last 24 hours) at 05/18/2023 1813 Last data filed at 05/18/2023 1744 Gross per 24 hour  Intake 2000 ml  Output --  Net 2000 ml   Filed Weights   05/18/23 1656 05/18/23 1747  Weight: 113.5 kg 113.5 kg    Examination: Physical Exam Constitutional:      General: He is in acute distress.     Appearance: He is ill-appearing. He is not toxic-appearing.  Cardiovascular:     Rate and Rhythm: Normal rate and regular rhythm.     Pulses: Normal pulses.     Heart sounds: Normal heart sounds.  Pulmonary:     Effort: Pulmonary effort is normal.     Breath sounds: Normal breath sounds.     Comments: Ventilated breath sounds bilaterally Musculoskeletal:     Right lower leg: No edema.     Left lower leg: No edema.  Neurological:     Mental Status: He is disoriented.     Comments: Pupils are equal and reactive. Patient attempting to reach for his tube and elevate off the bed. Does not follow commands.      Assessment &  Plan:   Neurology #Toxic Metabolic Encephalopathy  Presented with cardiac arrest, sedated with fentanyl and propofol post intubation. He's exhibiting purposeful movements with attempts at self extubation and elevation off the bed. While he hasn't followed commands, he has received sedation and analgesia. No role for targeted temperature management at this point.  -Maintain a RASS goal of -1 -Propofol and fentanyl for analgosedation -no role for normothermia -Avoid sedating medications as able -Daily wake up assessment   Cardiovascular #Cardiac Arrest #HFrEF #CAD s/p CABG #History of  ventricular fibrillation #AAA  He has an extensive history of coronary artery disease, s/p CABG x 4 in 2008 with most recent LHC from September of 2023 showing an occluded SVG to OM. He had an episode of ventricular tachycardia in 2019 for which he was shocked by EMS. Patient also with HFrEF on GDMT and atrial fibrillation on Eliquis. He presents now after syncopal episode followed by cardiac arrest at home, with ROSC. EKG doesn't show ST changes, and initial troponin was 21, repeated at 40. Overall picture not consistent with acute coronary syndrome but rather of an arrhythmia. He developed circulatory shock post arrest, likely in the setting of sedation (no sign of sepsis, lactic acid significantly improved) with other differentials including low output heart failure (low EF on bedside POCUS, no PE on imaging, no pericardial effusion). Will maintain on telemetry and consult cardiology in the AM.   -trend troponin -echocardiogram -cardiology in the AM for consideration of ICD -hold GDMT -nor-epi with goal MAP of > 65 mmHg  Pulmonary #Acute Hypoxic Respiratory Failure  Intubated in the setting of cardiac arrest, with initial blood gas showing metabolic acidosis (from lactic acid). Will initiate antibiotics for VAP prophylaxis per ID section. Otherwise chest imaging doesn't show any pulmonary pathology.  -Full vent support, implement lung protective strategies -Plateau pressures less than 30 cm H20 -Wean FiO2 & PEEP as tolerated to maintain O2 sats >92% -Follow intermittent Chest X-ray & ABG as needed -Spontaneous Breathing Trials when respiratory parameters met and mental status permits -Implement VAP Bundle -Prn Bronchodilators  Gastrointestinal  NPO for now, tube feed decision pending neurological assessment and plans for extubation. Initiated PPI for SUP  Renal #AKI on CKD #HyperKalemia  In the setting of cardiac arrest and hypoperfusion. Will attempt to avoid nephrotoxins and  monitor urine output and electrolytes.  -repeat BMP -hold entresto  Endocrine #Hypothyroidism  ICU glycemic protocol. Resume home levothyroxine  Hem/Onc  On Eliquis for Afib at home. H/H within normal. Monitor for any signs of anemia, and repeat PT/PTT.  -hold apixaban  ID  Will initiate Unasyn for prophylaxis against aspiration pneumonia per ANTHRATIC study.   Best Practice (right click and "Reselect all SmartList Selections" daily)   Diet/type: NPO DVT prophylaxis: DOAC GI prophylaxis: PPI Lines: N/A Foley:  Yes, and it is still needed Code Status:  full code Last date of multidisciplinary goals of care discussion [05/18/2023]  Labs   CBC: Recent Labs  Lab 05/18/23 1627  WBC 18.2*  NEUTROABS 5.9  HGB 15.5  HCT 48.3  MCV 94.2  PLT 201    Basic Metabolic Panel: Recent Labs  Lab 05/18/23 1627  NA 139  K 5.7*  CL 112*  CO2 10*  GLUCOSE 160*  BUN 37*  CREATININE 2.01*  CALCIUM 9.7   GFR: Estimated Creatinine Clearance: 43.9 mL/min (A) (by C-G formula based on SCr of 2.01 mg/dL (H)). Recent Labs  Lab 05/18/23 1627  WBC 18.2*  LATICACIDVEN >9.0*    Liver  Function Tests: Recent Labs  Lab 05/18/23 1627  AST 43*  ALT 20  ALKPHOS 103  BILITOT 1.0  PROT 7.5  ALBUMIN 4.3   No results for input(s): "LIPASE", "AMYLASE" in the last 168 hours. No results for input(s): "AMMONIA" in the last 168 hours.  ABG    Component Value Date/Time   PHART 7.32 (L) 05/18/2023 1730   PCO2ART 33 05/18/2023 1730   PO2ART 130 (H) 05/18/2023 1730   HCO3 17.0 (L) 05/18/2023 1730   ACIDBASEDEF 8.1 (H) 05/18/2023 1730   O2SAT 99.4 05/18/2023 1730     Coagulation Profile: Recent Labs  Lab 05/18/23 1627  INR 5.4*    Cardiac Enzymes: No results for input(s): "CKTOTAL", "CKMB", "CKMBINDEX", "TROPONINI" in the last 168 hours.  HbA1C: HB A1C (BAYER DCA - WAIVED)  Date/Time Value Ref Range Status  12/18/2015 08:27 AM 6.0 <7.0 % Final    Comment:                                           Diabetic Adult            <7.0                                       Healthy Adult        4.3 - 5.7                                                           (DCCT/NGSP) American Diabetes Association's Summary of Glycemic Recommendations for Adults with Diabetes: Hemoglobin A1c <7.0%. More stringent glycemic goals (A1c <6.0%) may further reduce complications at the cost of increased risk of hypoglycemia.    Hgb A1c MFr Bld  Date/Time Value Ref Range Status  01/11/2023 10:39 AM 6.1 (H) 4.8 - 5.6 % Final    Comment:             Prediabetes: 5.7 - 6.4          Diabetes: >6.4          Glycemic control for adults with diabetes: <7.0   01/05/2021 01:35 PM 5.7 (H) 4.8 - 5.6 % Final    Comment:             Prediabetes: 5.7 - 6.4          Diabetes: >6.4          Glycemic control for adults with diabetes: <7.0     CBG: No results for input(s): "GLUCAP" in the last 168 hours.  Review of Systems:   Unable to obtain  Past Medical History:  He,  has a past medical history of AAA (abdominal aortic aneurysm) (HCC), Anemia, Arthritis, Chronic anticoagulation, CKD (chronic kidney disease), stage II, Coronary artery disease, Depression, GERD (gastroesophageal reflux disease), Hernia of abdominal cavity, History of 2019 novel coronavirus disease (COVID-19) (09/2019), Hyperlipidemia, Hypertension, Hypothyroidism, Iliac artery aneurysm, right (HCC), Right middle lobe pulmonary nodule (06/30/2021), S/P CABG x 4 (06/25/2007), Sigmoid diverticulosis, and Ventricular tachycardia (HCC) (05/2018).   Surgical History:   Past Surgical History:  Procedure Laterality Date   ABDOMINAL AORTIC ANEURYSM REPAIR  N/A 08/29/2007   Procedure: EVAR; Location: [facility]; Surgeon: Brynda Greathouse, MD   ABDOMINAL AORTIC ANEURYSM REPAIR N/A 12/05/2012   Procedure: Abdominal aortic aneurysm of approximately 6 cm in maximal diameter status post previous endovascular repair with type I and III endoleaks;  Location: [facility]; Surgeon: Festus Barren, MD   CARDIAC CATHETERIZATION Left 06/28/2010   3v CAD with patent CABG grafts; LVEF 40%; stable aortic stent graft; Location: [facility]; Surgeon: Arnoldo Hooker, MD   CARDIAC CATHETERIZATION Left 06/18/2007   3v CAD; LVEF 50%; refer to CVTS for CABG; Location: [facility]; Surgeon: [name], MD   COLONOSCOPY WITH PROPOFOL N/A 11/17/2021   Procedure: COLONOSCOPY WITH PROPOFOL;  Surgeon: Toney Reil, MD;  Location: [facility] ENDOSCOPY;  Service: Gastroenterology;  Laterality: N/A;   CORONARY ARTERY BYPASS GRAFT N/A 06/25/2007   Procedure: 4v CABG (LIMA-LAD, SVG-RPDA, SVG-OM3, SVG-D2); Location: [facility]; Surgeon: Marshell Garfinkel, MD   CORONARY/GRAFT ANGIOGRAPHY N/A 06/05/2018   Procedure: Rosalie Doctor ANGIOGRAPHY;  Surgeon: Yvonne Kendall, MD;  Location: [facility] INVASIVE CV LAB;  Service: Cardiovascular;  Laterality: N/A;   ENDOVASCULAR REPAIR/STENT GRAFT N/A 08/04/2021   Procedure: ENDOVASCULAR REPAIR/STENT GRAFT;  Surgeon: Annice Needy, MD;  Location: [facility] INVASIVE CV LAB;  Service: Cardiovascular;  Laterality: N/A;   EXPLORATORY LAPAROTOMY  1997   HERNIA REPAIR     KNEE ARTHROPLASTY Left 09/19/2016   Procedure: COMPUTER ASSISTED TOTAL KNEE ARTHROPLASTY;  Surgeon: Donato [name], MD;  Location: [facility] ORS;  Service: Orthopedics;  Laterality: Left;   RIGHT/LEFT HEART CATH AND CORONARY ANGIOGRAPHY N/A 08/05/2022   Procedure: RIGHT/LEFT HEART CATH AND CORONARY ANGIOGRAPHY;  Surgeon: Iran Ouch, MD;  Location: [facility] INVASIVE CV LAB;  Service: Cardiovascular;  Laterality: N/A;   TOTAL KNEE ARTHROPLASTY Right 09/2016     Social History:   reports that he quit smoking about 17 years ago. His smoking use included cigarettes. He has a 35.00 pack-year smoking history. He has been exposed to tobacco smoke. His smokeless tobacco use includes chew. He reports that he does not drink alcohol and does not use drugs.   Family History:  His family history includes Depression in his  maternal grandfather and maternal grandmother; Heart attack in his father, mother, and another family member; Heart disease in his mother and another family member; Hypertension in his father and mother; Kidney disease in his brother. There is no history of Prostate cancer, Kidney cancer, Stomach cancer, or Colon cancer.   Allergies Allergies  Allergen Reactions   Benzodiazepines     Paradoxical agitation and delirium   Codeine Nausea Only   Tetracycline Rash     Home Medications  Prior to Admission medications   Medication Sig Start Date End Date Taking? Authorizing Provider  apixaban (ELIQUIS) 5 MG TABS tablet Take 1 tablet (5 mg total) by mouth 2 (two) times daily. 11/16/22  Yes Dunn, Raymon Mutton, PA-C  buPROPion (WELLBUTRIN SR) 200 MG 12 hr tablet Take 1 tablet (200 mg total) by mouth 2 (two) times daily. 11/16/22  Yes Cannady, Corrie Dandy T, NP  Cholecalciferol 50 MCG (2000 UT) CAPS Take 1 capsule by mouth daily.    Yes [provider]  citalopram (CELEXA) 40 MG tablet Take 1 tablet (40 mg total) by mouth daily. 04/26/23  Yes Cannady, [name], NP  ezetimibe (ZETIA) 10 MG tablet Take 1 tablet (10 mg total) by mouth daily. 03/28/23  Yes Cannady, Corrie Dandy T, NP  levothyroxine (SYNTHROID) 75 MCG tablet Take 1 tablet (75 mcg total) by mouth daily. 02/21/23  Yes Cannady,  [name], NP  metoprolol succinate (TOPROL XL) 25 MG 24 hr tablet Take 0.5 tablets (12.5 mg total) by mouth daily. 11/10/22  Yes Dunn, Raymon Mutton, PA-C  omeprazole (PRILOSEC) 20 MG capsule Take 1 capsule (20 mg total) by mouth daily. 02/21/23  Yes Cannady, [name], NP  rosuvastatin (CRESTOR) 40 MG tablet Take 1 tablet (40 mg total) by mouth daily. 04/26/23  Yes Iran Ouch, MD  sacubitril-valsartan (ENTRESTO) 24-26 MG Take 1 tablet by mouth 2 (two) times daily. Patient not taking: Reported on 05/18/2023 10/14/22   Sondra Barges, PA-C     Critical care time: 60 minutes    Raechel Chute, MD [facility] 05/18/2023  7:18 PM

## 2023-05-18 NOTE — Progress Notes (Signed)
ETT inserted 2cm to 26 @ lip per MD request based on CXR results

## 2023-05-18 NOTE — Code Documentation (Signed)
Patient placed on Zoll pads 

## 2023-05-18 NOTE — ED Provider Notes (Signed)
[facility] Provider Note    Event Date/Time   First MD Initiated Contact with Patient 05/18/23 1629     (approximate)   History   Post Cardiac Arrest   HPI  Stuart Harvey is a 72 y.o. male with a history of CAD status post CABG, HFrEF, AAA status post surgical repair in 2008, right iliac artery aneurysm status post endovascular repair in 2022, atrial fibrillation on Eliquis, CKD, hypertension, hyperlipidemia, hypothyroidism, anemia, GERD, and depression who presents with altered mental status after cardiac arrest.  I obtained history from both EMS and family.  The family members report that the patient had been working in the garage.  He called out for help.  The wife found him sitting on the stool leaning forward.  Subsequently he lost consciousness and collapsed.  He was noted to be cyanotic.  When EMS arrived the patient had no palpable pulses and CPR was initiated.  CPR was given for approximately 4 minutes when the patient had ROSC.  He did not receive any medication from EMS.  I reviewed the past medical records.  The patient was most recently evaluated by cardiology on 4/5 of this year.  He is being medically managed for CAD and was euvolemic at that time.   Physical Exam   Triage Vital Signs: ED Triage Vitals  Enc Vitals Group     BP 05/18/23 1628 137/76     Pulse Rate 05/18/23 1628 (!) 144     Resp 05/18/23 1628 20     Temp --      Temp src --      SpO2 05/18/23 1628 98 %     Weight 05/18/23 1656 250 lb 3.6 oz (113.5 kg)     Height --      Head Circumference --      Peak Flow --      Pain Score 05/18/23 1637 0     Pain Loc --      Pain Edu? --      Excl. in GC? --     Most recent vital signs: Vitals:   05/18/23 1815 05/18/23 1853  BP:  100/70  Pulse: 86 (!) 101  Resp: 20 13  Temp:  (!) 97 F (36.1 C)  SpO2: 99% 96%     General: Unresponsive, grunting respirations, posturing. CV:  Good peripheral perfusion.  Tachycardic,  irregular rhythm. Resp:  Decreased respiratory effort, grunting. Abd:  Soft and nontender.  No distention.  Other:  Intermittent posturing to extremities.  PERRLA.   ED Results / Procedures / Treatments   Labs (all labs ordered are listed, but only abnormal results are displayed) Labs Reviewed  COMPREHENSIVE METABOLIC PANEL - Abnormal; Notable for the following components:      Result Value   Potassium 5.7 (*)    Chloride 112 (*)    CO2 10 (*)    Glucose, Bld 160 (*)    BUN 37 (*)    Creatinine, Ser 2.01 (*)    AST 43 (*)    GFR, Estimated 35 (*)    Anion gap 17 (*)    All other components within normal limits  CBC WITH DIFFERENTIAL/PLATELET - Abnormal; Notable for the following components:   WBC 18.2 (*)    Lymphs Abs 10.5 (*)    Abs Immature Granulocytes 0.27 (*)    All other components within normal limits  LACTIC ACID, PLASMA - Abnormal; Notable for the following components:   Lactic Acid, Venous >  9.0 (*)    All other components within normal limits  ACETAMINOPHEN LEVEL - Abnormal; Notable for the following components:   Acetaminophen (Tylenol), Serum <10 (*)    All other components within normal limits  SALICYLATE LEVEL - Abnormal; Notable for the following components:   Salicylate Lvl <7.0 (*)    All other components within normal limits  URINALYSIS, ROUTINE W REFLEX MICROSCOPIC - Abnormal; Notable for the following components:   Color, Urine YELLOW (*)    APPearance CLOUDY (*)    Protein, ur 100 (*)    Leukocytes,Ua LARGE (*)    All other components within normal limits  URINE DRUG SCREEN, QUALITATIVE ([facility] ONLY) - Abnormal; Notable for the following components:   Benzodiazepine, Ur Scrn POSITIVE (*)    All other components within normal limits  BLOOD GAS, ARTERIAL - Abnormal; Notable for the following components:   pH, Arterial 7.32 (*)    pO2, Arterial 130 (*)    Bicarbonate 17.0 (*)    Acid-base deficit 8.1 (*)    All other components within normal limits   PROTIME-INR - Abnormal; Notable for the following components:   Prothrombin Time 49.6 (*)    INR 5.4 (*)    All other components within normal limits  BRAIN NATRIURETIC PEPTIDE - Abnormal; Notable for the following components:   B Natriuretic Peptide 513.5 (*)    All other components within normal limits  GLUCOSE, CAPILLARY - Abnormal; Notable for the following components:   Glucose-Capillary 122 (*)    All other components within normal limits  BASIC METABOLIC PANEL - Abnormal; Notable for the following components:   CO2 18 (*)    Glucose, Bld 143 (*)    BUN 36 (*)    Creatinine, Ser 1.69 (*)    Calcium 8.3 (*)    GFR, Estimated 43 (*)    All other components within normal limits  BLOOD GAS, VENOUS - Abnormal; Notable for the following components:   pCO2, Ven 40 (*)    pO2, Ven 65 (*)    Bicarbonate 19.2 (*)    Acid-base deficit 7.0 (*)    All other components within normal limits  PROTIME-INR - Abnormal; Notable for the following components:   Prothrombin Time 16.5 (*)    INR 1.3 (*)    All other components within normal limits  GLUCOSE, CAPILLARY - Abnormal; Notable for the following components:   Glucose-Capillary 130 (*)    All other components within normal limits  TROPONIN I (HIGH SENSITIVITY) - Abnormal; Notable for the following components:   Troponin I (High Sensitivity) 21 (*)    All other components within normal limits  TROPONIN I (HIGH SENSITIVITY) - Abnormal; Notable for the following components:   Troponin I (High Sensitivity) 40 (*)    All other components within normal limits  TROPONIN I (HIGH SENSITIVITY) - Abnormal; Notable for the following components:   Troponin I (High Sensitivity) 101 (*)    All other components within normal limits  SARS CORONAVIRUS 2 BY RT PCR  LACTIC ACID, PLASMA  MAGNESIUM  PHOSPHORUS  TRIGLYCERIDES  MAGNESIUM  PHOSPHORUS  BASIC METABOLIC PANEL  CBC  PREPARE FRESH FROZEN PLASMA  TYPE AND SCREEN  TYPE AND SCREEN   TROPONIN I (HIGH SENSITIVITY)     EKG  ED ECG REPORT I, Dionne Bucy, the attending physician, personally viewed and interpreted this ECG.  Date: 05/18/2023 EKG Time: 1627 Rate: 180 Rhythm: Nonspecific irregular tachycardia QRS Axis: normal Intervals: Wide QRS ST/T Wave abnormalities:  Nonspecific abnormalities Narrative Interpretation: Undetermined rhythm, likely atrial fibrillation with aberrancy   ED ECG REPORT I, Dionne Bucy, the attending physician, personally viewed and interpreted this ECG.  Date: 05/18/2023 EKG Time: 1651 Rate: 112 Rhythm: atrial fibrillation QRS Axis: normal Intervals: Nonspecific IVCD ST/T Wave abnormalities: Nonspecific ST abnormalities Narrative Interpretation: Atrial fibrillation with no evidence of acute ischemia; no significant change when compared EKG of 02/17/2023   RADIOLOGY  Chest x-ray: I independently viewed and interpreted the images; the tip of the ET tube is above the carina and the OG tube is in good position  CT head:  IMPRESSION:  1. No acute intracranial abnormality.  2. Gas in superficial venous anatomy of the lower head bilaterally,  likely reflecting IV access.    CT chest/abdomen/pelvis:   MPRESSION:  1. No evidence of thoracic aortic aneurysm, dissection, or other  acute aortic pathology.  2. Status post aortobiiliac stent endograft repair of the abdominal  aorta without acute findings.  3. No evidence of pulmonary embolism on limited non-tailored  examination.  4. Numerous nondisplaced fractures of the anterior ribs. No  pneumothorax.  5. Cardiomegaly and coronary artery disease status post median  sternotomy and CABG.  6. Sigmoid diverticulosis without evidence of acute diverticulitis.    PROCEDURES:  Critical Care performed: Yes, see critical care procedure note(s)  .Critical Care  Performed by: Dionne Bucy, MD Authorized by: Dionne Bucy, MD   Critical care provider  statement:    Critical care time (minutes):  60   Critical care time was exclusive of:  Separately billable procedures and treating other patients   Critical care was necessary to treat or prevent imminent or life-threatening deterioration of the following conditions:  Circulatory failure, respiratory failure and CNS failure or compromise   Critical care was time spent personally by me on the following activities:  Development of treatment plan with patient or surrogate, discussions with consultants, evaluation of patient's response to treatment, examination of patient, ordering and review of laboratory studies, ordering and review of radiographic studies, ordering and performing treatments and interventions, pulse oximetry, re-evaluation of patient's condition, review of old charts, obtaining history from patient or surrogate and ventilator management Procedure Name: Intubation Date/Time: 05/18/2023 5:19 PM  Performed by: Dionne Bucy, MDPre-anesthesia Checklist: Patient identified, Patient being monitored, Emergency Drugs available, Timeout performed and Suction available Oxygen Delivery Method: Non-rebreather mask Preoxygenation: Pre-oxygenation with 100% oxygen Induction Type: Rapid sequence Laryngoscope Size: Glidescope and 4 Grade View: Grade I Tube size: 7.5 mm Number of attempts: 1 Placement Confirmation: ETT inserted through vocal cords under direct vision, CO2 detector and Breath sounds checked- equal and bilateral Secured at: 25 cm Tube secured with: ETT holder       MEDICATIONS ORDERED IN ED: Medications  propofol (DIPRIVAN) 1000 MG/100ML infusion (30 mcg/kg/min  93.9 kg (Order-Specific) Intravenous New Bag/Given 05/18/23 2249)  fentaNYL in NS (77mcg/ml) infusion-PREMIX (10 mcg/hr Intravenous Rate/Dose Change 05/18/23 2013)  docusate sodium (COLACE) capsule 100 mg (has no administration in time range)  polyethylene glycol (MIRALAX / GLYCOLAX) packet 17 g (has  no administration in time range)  pantoprazole (PROTONIX) injection 40 mg (40 mg Intravenous Given 05/18/23 2231)  insulin aspart (novoLOG) injection 0-9 Units (1 Units Subcutaneous Given 05/18/23 2021)  0.9 %  sodium chloride infusion (250 mLs Intravenous New Bag/Given 05/18/23 2012)  Ampicillin-Sulbactam (UNASYN) 3 g in sodium chloride 0.9 % 100 mL IVPB (3 g Intravenous New Bag/Given 05/18/23 2021)  0.9 %  sodium chloride infusion (Manually program  via Guardrails IV Fluids) ( Intravenous Canceled Entry 05/18/23 2232)  levothyroxine (SYNTHROID) tablet 75 mcg (has no administration in time range)  docusate (COLACE) 50 MG/5ML liquid 100 mg (100 mg Per Tube Given 05/18/23 2231)  polyethylene glycol (MIRALAX / GLYCOLAX) packet 17 g (17 g Per Tube Given 05/18/23 2232)  fentaNYL (SUBLIMAZE) bolus via infusion 25-100 mcg (has no administration in time range)  phenylephrine CONCENTRATED 100mg  in sodium chloride 0.9% (0.4mg /mL) premix infusion (20 mcg/min Intravenous New Bag/Given 05/18/23 2306)  etomidate (AMIDATE) injection (30 mg Intravenous Given 05/18/23 1624)  succinylcholine (ANECTINE) injection (100 mg Intravenous Given 05/18/23 1625)  sodium chloride 0.9 % bolus 1,000 mL (0 mLs Intravenous Stopped 05/18/23 1744)  sodium chloride 0.9 % bolus 1,000 mL (0 mLs Intravenous Stopped 05/18/23 1734)  iohexol (OMNIPAQUE) 350 MG/ML injection 100 mL (100 mLs Intravenous Contrast Given 05/18/23 1709)  midazolam PF (VERSED) injection 4 mg (0 mg Intravenous Hold 05/18/23 1735)  fentaNYL (SUBLIMAZE) injection 100 mcg (100 mcg Intravenous Given 05/18/23 1720)     IMPRESSION / MDM / ASSESSMENT AND PLAN / ED COURSE  I reviewed the triage vital signs and the nursing notes.  72 year old male with PMH as noted above presents with altered mental status after an episode of unresponsiveness and cardiac arrest.  The patient received CPR in the field and had ROSC without any medications.  On arrival to the ED he was unresponsive, noted to  have grunting respirations, was posturing.  He was tachycardic to the 180s although blood pressure was normal.  The patient was placed on the cardiac monitor and IV access was established.  Due to the patient's poor respiratory status, he was intubated via RSI.  This was performed successfully on the first attempt.  O2 saturation remained in the high 90s after intubation.  Chest x-ray confirmed appropriate tube placement.  Over several minutes after intubation, the patient developed significant hypotension with a systolic blood pressure briefly as low as the 60s.  Heart rate gradually came down to the 110s.  EKG showed atrial fibrillation with a borderline wide QRS which is baseline for the patient, with no evidence of STEMI.  2 L of IV fluid were initiated, and after approximately half of this had run without any response in blood pressure, Levophed was initiated.  Propofol was initiated for sedation.  I performed a bedside ultrasound and noted a grossly adequate ejection fraction.  I attempted ultrasound of the abdomen but could not positively identified the aorta.  There appeared to be no large hematoma or any free fluid.  The blood pressure improved and the patient was taken to CT.  I counseled the family members on the workup up to that time.  Differential diagnosis includes, but is not limited to, intracranial hemorrhage, acute CVA, ruptured AAA or iliac aneurysm, aortic dissection, other vascular etiology, hypovolemia, acute infection/sepsis, acute MI.  We will obtain stat CTs of the head as well as a CTA of the chest/abdomen/pelvis to rule out acute aortic syndrome.  Lab workup is pending.  We will continue to fluid resuscitate and give pressors as needed.  I counseled the family on the patient's guarded prognosis.  Patient's presentation is most consistent with acute presentation with potential threat to life or bodily function.  The patient is on the cardiac monitor to evaluate for evidence of  arrhythmia and/or significant heart rate changes.  ----------------------------------------- 5:48 PM on 05/18/2023 -----------------------------------------  CT head is negative.  ET tube has been advanced 2 cm based on  the x-ray.  CT chest/abdomen/pelvis is pending.  I consulted and discussed the case with Dr.Dgayli from the ICU who agrees to evaluate the patient for ICU admission.   FINAL CLINICAL IMPRESSION(S) / ED DIAGNOSES   Final diagnoses:  Cardiac arrest (HCC)  Acute respiratory failure, unspecified whether with hypoxia or hypercapnia (HCC)  Altered mental status, unspecified altered mental status type  Hypotension, unspecified hypotension type     Rx / DC Orders   ED Discharge Orders     None        Note:  This document was prepared using Dragon voice recognition software and may include unintentional dictation errors.    Dionne Bucy, MD 05/18/23 [phone]

## 2023-05-18 NOTE — ED Notes (Signed)
Pt attempting to remove ETT tube at this time. MD at bedside. Verbal order total of fentanyl bolus from bag.

## 2023-05-18 NOTE — Progress Notes (Signed)
eLink Physician-Brief Progress Note Patient Name: Stuart Harvey DOB: April 21, 1951 MRN: [id number]   Date of Service  05/18/2023  HPI/Events of Note  Patient admitted with out of hospital cardiac arrest requiring CPR with return of ROSC, patient was intubated for airway protection, he has an extensive history of CAD,, work up is in progress.  eICU Interventions  New Patient Evaluation.        [name] 05/18/2023, 8:10 PM

## 2023-05-18 NOTE — ED Notes (Signed)
New blue and light green tube sent to the lab

## 2023-05-18 NOTE — Consult Note (Signed)
Pharmacy Antibiotic Note  Stuart Harvey is a 72 y.o. male admitted on 05/18/2023 with ROSC s/p out of hospital cardiac arrest. .  Pharmacy has been consulted for Unasyn dosing for PNA prevention  Plan: Initiate Unasyn 3 gram Q6H x 48 hours per ANTHARTIC study per consult  Height: 6' (182.9 cm) Weight: 113.5 kg (250 lb 3.6 oz) IBW/kg (Calculated) : 77.6  Temp (24hrs), Avg:98.1 F (36.7 C), Min:98.1 F (36.7 C), Max:98.1 F (36.7 C)  Recent Labs  Lab 05/18/23 1627 05/18/23 1820  WBC 18.2*  --   CREATININE 2.01*  --   LATICACIDVEN >9.0* 1.9    Estimated Creatinine Clearance: 43.9 mL/min (A) (by C-G formula based on SCr of 2.01 mg/dL (H)).    Allergies  Allergen Reactions   Benzodiazepines     Paradoxical agitation and delirium   Codeine Nausea Only   Tetracycline Rash    Antimicrobials this admission: 74 Unasyn >>    Dose adjustments this admission:   Microbiology results:  BCx:   UCx:    Sputum:    MRSA PCR:   Thank you for allowing pharmacy to be a part of this patient's care.  Sharen Hones, PharmD, BCPS Clinical Pharmacist   05/18/2023 6:41 PM

## 2023-05-18 NOTE — Progress Notes (Signed)
Pt transported to ICU with RN without incident 

## 2023-05-18 NOTE — ED Triage Notes (Signed)
Pt to ED via [facility] from home. EMS reports that pts wife reported that pt was in the garage and reported feeling dizziness and then passed out. When EMS arrive CPR was in progress. Pt was pulseless and EMS reports that pt was blue from the chest up. After approximately 4 minutes of CPR pt achieved ROSC. No medications were administered by EMS. Pt has a yellow IO in the right tibia.   Pt arrives on Non-rebreather, pt is grunting and posturing. Decision made to intubate patient.

## 2023-05-19 ENCOUNTER — Other Ambulatory Visit: Payer: Self-pay

## 2023-05-19 ENCOUNTER — Inpatient Hospital Stay (HOSPITAL_COMMUNITY)
Admit: 2023-05-19 | Discharge: 2023-05-19 | Disposition: A | Discharge status: Home / Self Care | Payer: Medicare HMO | Attending: Student in an Organized Health Care Education/Training Program | Admitting: Student in an Organized Health Care Education/Training Program

## 2023-05-19 ENCOUNTER — Encounter: Payer: Self-pay | Admitting: Student in an Organized Health Care Education/Training Program

## 2023-05-19 ENCOUNTER — Inpatient Hospital Stay: Payer: Medicare HMO

## 2023-05-19 DIAGNOSIS — J9600 Acute respiratory failure, unspecified whether with hypoxia or hypercapnia: Secondary | ICD-10-CM | POA: Diagnosis not present

## 2023-05-19 DIAGNOSIS — I959 Hypotension, unspecified: Secondary | ICD-10-CM

## 2023-05-19 DIAGNOSIS — I4811 Longstanding persistent atrial fibrillation: Secondary | ICD-10-CM | POA: Diagnosis not present

## 2023-05-19 DIAGNOSIS — G928 Other toxic encephalopathy: Secondary | ICD-10-CM

## 2023-05-19 DIAGNOSIS — I469 Cardiac arrest, cause unspecified: Secondary | ICD-10-CM

## 2023-05-19 DIAGNOSIS — I255 Ischemic cardiomyopathy: Secondary | ICD-10-CM

## 2023-05-19 DIAGNOSIS — I483 Typical atrial flutter: Secondary | ICD-10-CM | POA: Diagnosis not present

## 2023-05-19 DIAGNOSIS — R4182 Altered mental status, unspecified: Secondary | ICD-10-CM

## 2023-05-19 DIAGNOSIS — I480 Paroxysmal atrial fibrillation: Secondary | ICD-10-CM

## 2023-05-19 LAB — ECHOCARDIOGRAM COMPLETE
AR max vel: 3.22 cm2
AV Area VTI: 3.92 cm2
AV Area mean vel: 3.1 cm2
AV Mean grad: 1.5 mmHg
AV Peak grad: 3 mmHg
Ao pk vel: 0.87 m/s
Area-P 1/2: 6.83 cm2
Calc EF: 51.8 %
Height: 72 in
S' Lateral: 4.4 cm
Single Plane A2C EF: 49.8 %
Single Plane A4C EF: 50.9 %
Weight: 3333.36 oz

## 2023-05-19 LAB — BASIC METABOLIC PANEL
Anion gap: 6 (ref 5–15)
BUN: 34 mg/dL — ABNORMAL HIGH (ref 8–23)
CO2: 22 mmol/L (ref 22–32)
Calcium: 8.5 mg/dL — ABNORMAL LOW (ref 8.9–10.3)
Chloride: 113 mmol/L — ABNORMAL HIGH (ref 98–111)
Creatinine, Ser: 1.65 mg/dL — ABNORMAL HIGH (ref 0.61–1.24)
GFR, Estimated: 44 mL/min — ABNORMAL LOW (ref >60)
Glucose, Bld: 101 mg/dL — ABNORMAL HIGH (ref 70–99)
Potassium: 4.7 mmol/L (ref 3.5–5.1)
Sodium: 141 mmol/L (ref 135–145)

## 2023-05-19 LAB — CBC
HCT: 41.4 % (ref 39.0–52.0)
Hemoglobin: 13.2 g/dL (ref 13.0–17.0)
MCH: 30.1 pg (ref 26.0–34.0)
MCHC: 31.9 g/dL (ref 30.0–36.0)
MCV: 94.3 fL (ref 80.0–100.0)
Platelets: 179 10*3/uL (ref 150–400)
RBC: 4.39 MIL/uL (ref 4.22–5.81)
RDW: 14.8 % (ref 11.5–15.5)
WBC: 14.6 10*3/uL — ABNORMAL HIGH (ref 4.0–10.5)
nRBC: 0 % (ref 0.0–0.2)

## 2023-05-19 LAB — MAGNESIUM: Magnesium: 2.4 mg/dL (ref 1.7–2.4)

## 2023-05-19 LAB — PREPARE FRESH FROZEN PLASMA

## 2023-05-19 LAB — TROPONIN I (HIGH SENSITIVITY): Troponin I (High Sensitivity): 116 ng/L (ref <18)

## 2023-05-19 LAB — TYPE AND SCREEN: ABO/RH(D): O NEG

## 2023-05-19 LAB — BPAM FFP: Blood Product Expiration Date: 202407092359

## 2023-05-19 LAB — GLUCOSE, CAPILLARY
Glucose-Capillary: 96 mg/dL (ref 70–99)
Glucose-Capillary: 87 mg/dL (ref 70–99)
Glucose-Capillary: 136 mg/dL — ABNORMAL HIGH (ref 70–99)
Glucose-Capillary: 129 mg/dL — ABNORMAL HIGH (ref 70–99)
Glucose-Capillary: 122 mg/dL — ABNORMAL HIGH (ref 70–99)
Glucose-Capillary: 127 mg/dL — ABNORMAL HIGH (ref 70–99)

## 2023-05-19 LAB — APTT: aPTT: 140 seconds — ABNORMAL HIGH (ref 24–36)

## 2023-05-19 LAB — PHOSPHORUS: Phosphorus: 4.4 mg/dL (ref 2.5–4.6)

## 2023-05-19 LAB — TRIGLYCERIDES: Triglycerides: 120 mg/dL (ref <150)

## 2023-05-19 LAB — PROCALCITONIN: Procalcitonin: <0.1 ng/mL

## 2023-05-19 MED ORDER — HYDROCODONE-ACETAMINOPHEN 5-325 MG PO TABS
1.0000 | ORAL_TABLET | Freq: Four times a day (QID) | ORAL | Status: DC | PRN
  Administered 2023-05-19 (×2): 2 via ORAL
  Administered 2023-05-20: 1 via ORAL
  Filled 2023-05-19 (×3): qty 2

## 2023-05-19 MED ORDER — AMIODARONE LOAD VIA INFUSION
150.0000 mg | Freq: Once | INTRAVENOUS | Status: AC
  Administered 2023-05-19: 150 mg via INTRAVENOUS
  Filled 2023-05-19: qty 83.34

## 2023-05-19 MED ORDER — HEPARIN BOLUS VIA INFUSION
5000.0000 [IU] | Freq: Once | INTRAVENOUS | Status: AC
  Administered 2023-05-19: 5000 [IU] via INTRAVENOUS
  Filled 2023-05-19: qty 5000

## 2023-05-19 MED ORDER — AMIODARONE HCL IN DEXTROSE 360-4.14 MG/200ML-% IV SOLN
60.0000 mg/h | INTRAVENOUS | Status: AC
  Administered 2023-05-19: 60 mg/h via INTRAVENOUS
  Filled 2023-05-19 (×2): qty 200

## 2023-05-19 MED ORDER — SODIUM CHLORIDE 0.9 % IV SOLN: INTRAVENOUS | Status: DC

## 2023-05-19 MED ORDER — FENTANYL CITRATE PF 50 MCG/ML IJ SOSY
12.5000 ug | PREFILLED_SYRINGE | INTRAMUSCULAR | Status: DC | PRN
  Administered 2023-05-19 – 2023-05-20 (×2): 12.5 ug via INTRAVENOUS
  Filled 2023-05-19 (×2): qty 1

## 2023-05-19 MED ORDER — MIDODRINE HCL 5 MG PO TABS
5.0000 mg | ORAL_TABLET | Freq: Three times a day (TID) | ORAL | Status: DC
  Administered 2023-05-19 – 2023-05-21 (×7): 5 mg via ORAL
  Filled 2023-05-19 (×7): qty 1

## 2023-05-19 MED ORDER — CHLORHEXIDINE GLUCONATE CLOTH 2 % EX PADS
6.0000 | MEDICATED_PAD | Freq: Every day | CUTANEOUS | Status: DC
  Administered 2023-05-19 – 2023-05-21 (×3): 6 via TOPICAL

## 2023-05-19 MED ORDER — FUROSEMIDE 10 MG/ML IJ SOLN
INTRAMUSCULAR | Status: AC
  Filled 2023-05-19: qty 4

## 2023-05-19 MED ORDER — HEPARIN (PORCINE) 25000 UT/250ML-% IV SOLN
1100.0000 [IU]/h | INTRAVENOUS | Status: DC
  Administered 2023-05-19: 1100 [IU]/h via INTRAVENOUS
  Filled 2023-05-19: qty 250

## 2023-05-19 MED ORDER — AMIODARONE HCL IN DEXTROSE 360-4.14 MG/200ML-% IV SOLN
60.0000 mg/h | INTRAVENOUS | Status: DC
  Administered 2023-05-19 (×2): 30 mg/h via INTRAVENOUS
  Administered 2023-05-20 – 2023-05-22 (×6): 60 mg/h via INTRAVENOUS
  Filled 2023-05-19 (×9): qty 200

## 2023-05-19 MED ORDER — FUROSEMIDE 10 MG/ML IJ SOLN
40.0000 mg | Freq: Once | INTRAMUSCULAR | Status: AC
  Administered 2023-05-19: 40 mg via INTRAVENOUS

## 2023-05-19 MED ORDER — ASPIRIN 81 MG PO TBEC
81.0000 mg | DELAYED_RELEASE_TABLET | Freq: Every day | ORAL | Status: DC
  Administered 2023-05-20 – 2023-05-25 (×6): 81 mg via ORAL
  Filled 2023-05-19 (×7): qty 1

## 2023-05-19 NOTE — Progress Notes (Signed)
Order to hold Heparin due to pink tinged urine per Leanord Asal NP

## 2023-05-19 NOTE — Progress Notes (Signed)
*  PRELIMINARY RESULTS* Echocardiogram 2D Echocardiogram has been performed.  Stuart Harvey 05/19/2023, 8:06 AM

## 2023-05-19 NOTE — Consult Note (Signed)
PHARMACY CONSULT NOTE  Pharmacy Consult for Electrolyte Monitoring and Replacement   Recent Labs: Potassium (mmol/L)  Date Value  05/19/2023 4.7  02/20/2015 4.5   Magnesium (mg/dL)  Date Value  40/98/1191 2.4  12/06/2012 1.8   Calcium (mg/dL)  Date Value  47/82/9562 8.5 (L)   Calcium, Total (mg/dL)  Date Value  13/06/6577 8.8 (L)   Albumin (g/dL)  Date Value  46/96/2952 4.3  10/11/2022 4.1  12/13/2014 3.9   Phosphorus (mg/dL)  Date Value  84/13/2440 4.4  12/06/2012 3.3   Sodium (mmol/L)  Date Value  05/19/2023 141  10/11/2022 141  02/20/2015 135     Assessment: 72 y.o. male with a history of CAD status post CABG, HFrEF, AAA status post surgical repair in 2008, right iliac artery aneurysm status post endovascular repair in 2022, atrial fibrillation on Eliquis, CKD, hypertension, hyperlipidemia, hypothyroidism, anemia, GERD, and depression who presents with altered mental status after out of hospital cardiac arrest w/ ROSC after ~ 4 minutes. Pharmacy has been consulted to monitor and replace electrolytes while in ICU.  Goal of Therapy:  Potassium  4.0 - 5.1 mmol/L Magnesium  2.0 - 2.4 mg/dL All other electrolytes WNL  Plan:  No additional replacement needed at this time Recheck all electrolytes with AM labs  Lowella Bandy, PharmD, BCPS Clinical Pharmacist   05/19/2023 7:09 AM

## 2023-05-19 NOTE — Progress Notes (Signed)
Patient extubated, without complications, to HHFNC @ 80% and 60L. Patient tolerating well with saturations of 92%.

## 2023-05-19 NOTE — Progress Notes (Signed)
ANTICOAGULATION CONSULT NOTE  Pharmacy Consult for heparin infusion Indication: atrial fibrillation  Allergies  Allergen Reactions   Benzodiazepines     Paradoxical agitation and delirium   Codeine Nausea Only   Tetracycline Rash    Patient Measurements: Height: 6' (182.9 cm) Weight: 95.4 kg (210 lb 5.1 oz) IBW/kg (Calculated) : 77.6 Heparin Dosing Weight: 102 kg  Vital Signs: BP: 82/70 (07/05 0400) Pulse Rate: 106 (07/05 0400)  Labs: Recent Labs    05/18/23 1627 05/18/23 1820 05/18/23 2053 05/18/23 2247 05/19/23 0211 05/19/23 0444  HGB 15.5  --   --   --   --  13.2  HCT 48.3  --   --   --   --  41.4  PLT 201  --   --   --   --  179  LABPROT 49.6*  --  16.5*  --   --   --   INR 5.4*  --  1.3*  --   --   --   CREATININE 2.01*  --  1.69*  --   --  1.65*  TROPONINIHS 21* 40*  --  101* 116*  --     Estimated Creatinine Clearance: 49.2 mL/min (A) (by C-G formula based on SCr of 1.65 mg/dL (H)).   Medical History: Past Medical History:  Diagnosis Date   AAA (abdominal aortic aneurysm) (HCC)    a.) s/p EVAR in 08/2007 by Dr. Earnestine Leys. b.) CTA on 06/30/2021 --> interval aneurysmal dilitation superior to stent graft with the juxtarenal aorta measuring 3.8 cm.   Anemia    Arthritis    both knees   Chronic anticoagulation    Apixaban   CKD (chronic kidney disease), stage II    Coronary artery disease    a.) 4v CABG @ [facility] 06/2007 (LIMA-LAD, VG-RPDA, VG-OM3,VG-D2); b.) LHC 2011: patent grafts EF 40%, c.) LHC 06/05/18: LM nl, pLAD 50%, p-mLAD 50%, ostD1 70%, ost ramus 30%, ost-pLCx 40%, pLCx 100% (chronically occluded), OM3 100%, p-dRCA 100% (chronically occluded), LIMA-LAD patent, VG-RPDA patent, VG-OM3 mid-graft 60% and 80%   Depression    GERD (gastroesophageal reflux disease)    Hernia of abdominal cavity    History of 2019 novel coronavirus disease (COVID-19) 09/2019   Hyperlipidemia    Hypertension    Hypothyroidism    Iliac artery aneurysm, right (HCC)    a.) at  the distal landing zone of the right iliac limb measuring 3 cm   Right middle lobe pulmonary nodule 06/30/2021   a.) measured 8 mm by CT on 06/30/2021.   S/P CABG x 4 06/25/2007   a.) LIMA-LAD, SVG-RPDA, SVG-OM3, SVG-D2   Sigmoid diverticulosis    Ventricular tachycardia (HCC) 05/2018   a.) found unresponsive by EMS; VT noted --> defibrillation achieved ROSC. Briefly intubated. Episode felt to be secondary to heat stoke.    Medications:  Scheduled:   sodium chloride   Intravenous Once   docusate  100 mg Per Tube BID   insulin aspart  0-9 Units Subcutaneous Q4H   levothyroxine  75 mcg Oral Q0600   pantoprazole (PROTONIX) IV  40 mg Intravenous QHS   polyethylene glycol  17 g Per Tube Daily    Assessment: 72 y.o. male with a history of CAD status post CABG, HFrEF, AAA status post surgical repair in 2008, right iliac artery aneurysm status post endovascular repair in 2022, atrial fibrillation on Eliquis, CKD, hypertension, hyperlipidemia, hypothyroidism, anemia, GERD, and depression who presents with altered mental status after out of hospital cardiac  arrest w/ ROSC after ~ 4 minutes.   Goal of Therapy:  Heparin level 0.3-0.7 units/ml aPTT 66 - 102 seconds Monitor platelets by anticoagulation protocol: Yes   Plan:  ---Give 5000 units bolus x 1 ---Start heparin infusion at 1100 units/hr ---Check aPTT level in 6 hours and daily while on heparin (we'll check anti-Xa levels once daily and if correlating we can switch to anti-Xa) ---Continue to monitor H&H and platelets  Lowella Bandy 05/19/2023,8:54 AM

## 2023-05-19 NOTE — Progress Notes (Signed)
NAME:  Stuart Harvey, MRN:  [id number], DOB:  02/08/1951, LOS: 1 ADMISSION DATE:  05/18/2023,  CHIEF COMPLAINT:  Cardiac Arrest   History of Present Illness:  Patient is a 72 year old male brought in by EMS following cardiac arrest at home.   Patient was intubated and sedated at the time of our evaluation. History was obtained through collateral from his wife and ED providers. He was in his usual state of health up until earlier today. His wife found him sitting in their garage and he complained of dizziness. He then lost consciousness and fell on the floor. She activated EMS and initiated bystander CPR. On EMS arrival and continuation of ACLS, ROSC was achieved.   On arrival to the ED, the patient was intubated and was noted to be hypotensive post intubation. He was started on vasopressors as well as propofol and fentnayl for analgosedation. Imaging included head, chest, abdomen, and pelvis CT's did not show any acute findings aside from rib fractures.   PCCM consulted for admission to the ICU.  Pertinent  Medical History  -CAD (s/p 4 vessel CABG in 2008) -history of ventricular tachycardia in 2019 -CKD stage III a -HFrEF -Infrarenal AAA (s/p open repair 2008 and endovascular repair in 2022) -afib on Eliquis -HTN -hypothyroidism   RHC/LHC 07/2022     1.  Significant three-vessel coronary artery disease with patent grafts including LIMA to LAD, SVG to diagonal and SVG to right PDA.  Occluded SVG to OM which is new compared to most recent cardiac catheterization.  However, this is not unexpected given that the graft was diffusely diseased. 2.  Left ventricular angiography was not performed.  EF was moderately reduced by echo. 3.  Right heart catheterization showed normal right and left-sided filling pressures, normal pulmonary pressures and normal cardiac output.  Significant Hospital Events: Including procedures, antibiotic start and stop dates in addition to other pertinent events    05/18/2023: cardiac arrest, intubated, admit to the ICU 05/19/2023: echocardiogram, cards consult  Interim History / Subjective:  Patient is unresponsive this AM. Pupils constricted.  Objective   Blood pressure (!) 82/70, pulse (!) 106, temperature 98.3 F (36.8 C), temperature source Oral, resp. rate 15, height 6' (1.829 m), weight 94.5 kg, SpO2 98 %. CVP:  [9 mmHg] 9 mmHg  Vent Mode: PRVC FiO2 (%):  [30 %-50 %] 30 % Set Rate:  [15 bmp] 15 bmp Vt Set:  [550 mL] 550 mL PEEP:  [5 cmH20-8 cmH20] 5 cmH20 Plateau Pressure:  [14 cmH20-16 cmH20] 14 cmH20   Intake/Output Summary (Last 24 hours) at 05/19/2023 0817 Last data filed at 05/19/2023 0500 Gross per 24 hour  Intake 2916.13 ml  Output 965 ml  Net 1951.13 ml   Filed Weights   05/18/23 1656 05/18/23 1747 05/18/23 1853  Weight: 113.5 kg 113.5 kg 94.5 kg    Examination: Physical Exam Constitutional:      General: He is in acute distress.     Appearance: He is ill-appearing. He is not toxic-appearing.  Cardiovascular:     Rate and Rhythm: Normal rate and regular rhythm.     Pulses: Normal pulses.     Heart sounds: Normal heart sounds.  Pulmonary:     Effort: Pulmonary effort is normal.     Breath sounds: Normal breath sounds.     Comments: Ventilated breath sounds bilaterally Musculoskeletal:     Right lower leg: No edema.     Left lower leg: No edema.  Neurological:  Mental Status: He is disoriented.     Comments: Pupils are pin point this AM, mildly reactive. Sedated this AM.     Assessment & Plan:   72 year old male with extensive cardiac history (CAD, HFrEF, Afib, previous Vfib) presenting to the hospital with syncopal episode and cardiac arrest likely secondary to ventricular arrhythmia.  Neurology #Toxic Metabolic Encephalopathy   Presented with cardiac arrest, sedated with fentanyl and propofol post intubation. He's exhibited purposeful movements with attempts at self extubation. While he hasn't followed  commands, he has received sedation and analgesia. No role for targeted temperature management at this point.   -Maintain a RASS goal of -1 -Propofol and fentanyl for analgosedation -no role for normothermia -Avoid sedating medications as able -Daily wake up assessment     Cardiovascular #Cardiac Arrest #HFrEF #CAD s/p CABG #Afib/AFlutter #History of ventricular fibrillation #AAA   He has an extensive history of coronary artery disease, s/p CABG x 4 in 2008 with most recent LHC from September of 2023 showing an occluded SVG to OM. He had an episode of ventricular tachycardia in 2019 for which he was shocked by EMS. Patient also with HFrEF on GDMT and atrial fibrillation on Eliquis. He presents now after syncopal episode followed by cardiac arrest at home, with ROSC. EKG doesn't show ST changes, and initial troponin was 21, repeated with mild elevation. EKG and troponins are not consistent with an acute coronary syndrome but rather of arrhythmia, which could be precipitated by CAD, scar, electrolyte distrubances, etc... He developed circulatory shock post arrest, likely in the setting of sedation (no sign of sepsis, lactic acid improved) with other differentials including low output heart failure (low EF on bedside POCUS, no PE on imaging, no pericardial effusion). Will maintain on telemetry and consult cardiology.    -echocardiogram -cardiology in the AM for consideration of ICD -hold GDMT -nor-epi with goal MAP of > 65 mmHg   Pulmonary #Acute Hypoxic Respiratory Failure  Intubated in the setting of cardiac arrest, with initial blood gas showing metabolic acidosis (from lactic acid). Will initiate antibiotics for VAP prophylaxis per ID section. Otherwise chest imaging doesn't show any pulmonary pathology.  -Full vent support, implement lung protective strategies -Plateau pressures less than 30 cm H20 -Wean FiO2 & PEEP as tolerated to maintain O2 sats >92% -Follow intermittent Chest  X-ray & ABG as needed -Spontaneous Breathing Trials when respiratory parameters met and mental status permits -Implement VAP Bundle -Prn Bronchodilators   Gastrointestinal   NPO for now, tube feed decision pending neurological assessment and plans for extubation. Initiated PPI for SUP   Renal #AKI on CKD #HyperKalemia   In the setting of cardiac arrest and hypoperfusion. Will attempt to avoid nephrotoxins and monitor urine output and electrolytes.   -repeat BMP -hold entresto   Endocrine #Hypothyroidism   ICU glycemic protocol. Resume home levothyroxine   Hem/Onc   On Eliquis for Afib at home. H/H within normal. Monitor for any signs of anemia, and repeat PT/PTT.   -hold apixaban -start heparin gtt   ID   initiated Unasyn for prophylaxis against VAP per ANTHRATIC study.  Best Practice (right click and "Reselect all SmartList Selections" daily)   Diet/type: NPO DVT prophylaxis: systemic heparin GI prophylaxis: PPI Lines: Central line and yes and it is still needed Foley:  Yes, and it is still needed Code Status:  full code Last date of multidisciplinary goals of care discussion [05/19/2023]  Labs   CBC: Recent Labs  Lab 05/18/23 1627 05/19/23 0444  WBC 18.2* 14.6*  NEUTROABS 5.9  --   HGB 15.5 13.2  HCT 48.3 41.4  MCV 94.2 94.3  PLT 201 179    Basic Metabolic Panel: Recent Labs  Lab 05/18/23 1627 05/18/23 2053 05/18/23 2247 05/19/23 0444  NA 139 136  --  141  K 5.7* 5.1  --  4.7  CL 112* 111  --  113*  CO2 10* 18*  --  22  GLUCOSE 160* 143*  --  101*  BUN 37* 36*  --  34*  CREATININE 2.01* 1.69*  --  1.65*  CALCIUM 9.7 8.3*  --  8.5*  MG  --   --  2.4 2.4  PHOS  --   --  4.1 4.4   GFR: Estimated Creatinine Clearance: 49 mL/min (A) (by C-G formula based on SCr of 1.65 mg/dL (H)). Recent Labs  Lab 05/18/23 1627 05/18/23 1820 05/19/23 0444  WBC 18.2*  --  14.6*  LATICACIDVEN >9.0* 1.9  --     Liver Function Tests: Recent Labs  Lab  05/18/23 1627  AST 43*  ALT 20  ALKPHOS 103  BILITOT 1.0  PROT 7.5  ALBUMIN 4.3   No results for input(s): "LIPASE", "AMYLASE" in the last 168 hours. No results for input(s): "AMMONIA" in the last 168 hours.  ABG    Component Value Date/Time   PHART 7.32 (L) 05/18/2023 1730   PCO2ART 33 05/18/2023 1730   PO2ART 130 (H) 05/18/2023 1730   HCO3 19.2 (L) 05/18/2023 2053   ACIDBASEDEF 7.0 (H) 05/18/2023 2053   O2SAT 93 05/18/2023 2053     Coagulation Profile: Recent Labs  Lab 05/18/23 1627 05/18/23 2053  INR 5.4* 1.3*    Cardiac Enzymes: No results for input(s): "CKTOTAL", "CKMB", "CKMBINDEX", "TROPONINI" in the last 168 hours.  HbA1C: HB A1C (BAYER DCA - WAIVED)  Date/Time Value Ref Range Status  12/18/2015 08:27 AM 6.0 <7.0 % Final    Comment:                                          Diabetic Adult            <7.0                                       Healthy Adult        4.3 - 5.7                                                           (DCCT/NGSP) American Diabetes Association's Summary of Glycemic Recommendations for Adults with Diabetes: Hemoglobin A1c <7.0%. More stringent glycemic goals (A1c <6.0%) may further reduce complications at the cost of increased risk of hypoglycemia.    Hgb A1c MFr Bld  Date/Time Value Ref Range Status  01/11/2023 10:39 AM 6.1 (H) 4.8 - 5.6 % Final    Comment:             Prediabetes: 5.7 - 6.4          Diabetes: >6.4          Glycemic control for adults with diabetes: <  7.0   01/05/2021 01:35 PM 5.7 (H) 4.8 - 5.6 % Final    Comment:             Prediabetes: 5.7 - 6.4          Diabetes: >6.4          Glycemic control for adults with diabetes: <7.0     CBG: Recent Labs  Lab 05/18/23 1844 05/18/23 2307 05/19/23 0332 05/19/23 0727  GLUCAP 122* 130* 96 87    Review of Systems:   Unable to obtain  Past Medical History:  He,  has a past medical history of AAA (abdominal aortic aneurysm) (HCC), Anemia, Arthritis,  Chronic anticoagulation, CKD (chronic kidney disease), stage II, Coronary artery disease, Depression, GERD (gastroesophageal reflux disease), Hernia of abdominal cavity, History of 2019 novel coronavirus disease (COVID-19) (09/2019), Hyperlipidemia, Hypertension, Hypothyroidism, Iliac artery aneurysm, right (HCC), Right middle lobe pulmonary nodule (06/30/2021), S/P CABG x 4 (06/25/2007), Sigmoid diverticulosis, and Ventricular tachycardia (HCC) (05/2018).   Surgical History:   Past Surgical History:  Procedure Laterality Date   ABDOMINAL AORTIC ANEURYSM REPAIR N/A 08/29/2007   Procedure: EVAR; Location: [facility]; Surgeon: Brynda Greathouse, MD   ABDOMINAL AORTIC ANEURYSM REPAIR N/A 12/05/2012   Procedure: Abdominal aortic aneurysm of approximately 6 cm in maximal diameter status post previous endovascular repair with type I and III endoleaks; Location: [facility]; Surgeon: Festus Barren, MD   CARDIAC CATHETERIZATION Left 06/28/2010   3v CAD with patent CABG grafts; LVEF 40%; stable aortic stent graft; Location: [facility]; Surgeon: Arnoldo Hooker, MD   CARDIAC CATHETERIZATION Left 06/18/2007   3v CAD; LVEF 50%; refer to CVTS for CABG; Location: [facility]; Surgeon: [name], MD   COLONOSCOPY WITH PROPOFOL N/A 11/17/2021   Procedure: COLONOSCOPY WITH PROPOFOL;  Surgeon: Toney Reil, MD;  Location: [facility] ENDOSCOPY;  Service: Gastroenterology;  Laterality: N/A;   CORONARY ARTERY BYPASS GRAFT N/A 06/25/2007   Procedure: 4v CABG (LIMA-LAD, SVG-RPDA, SVG-OM3, SVG-D2); Location: [facility]; Surgeon: Marshell Garfinkel, MD   CORONARY/GRAFT ANGIOGRAPHY N/A 06/05/2018   Procedure: Rosalie Doctor ANGIOGRAPHY;  Surgeon: Yvonne Kendall, MD;  Location: [facility] INVASIVE CV LAB;  Service: Cardiovascular;  Laterality: N/A;   ENDOVASCULAR REPAIR/STENT GRAFT N/A 08/04/2021   Procedure: ENDOVASCULAR REPAIR/STENT GRAFT;  Surgeon: Annice Needy, MD;  Location: [facility] INVASIVE CV LAB;  Service: Cardiovascular;  Laterality: N/A;   EXPLORATORY LAPAROTOMY   1997   HERNIA REPAIR     KNEE ARTHROPLASTY Left 09/19/2016   Procedure: COMPUTER ASSISTED TOTAL KNEE ARTHROPLASTY;  Surgeon: Donato [name], MD;  Location: [facility] ORS;  Service: Orthopedics;  Laterality: Left;   RIGHT/LEFT HEART CATH AND CORONARY ANGIOGRAPHY N/A 08/05/2022   Procedure: RIGHT/LEFT HEART CATH AND CORONARY ANGIOGRAPHY;  Surgeon: Iran Ouch, MD;  Location: [facility] INVASIVE CV LAB;  Service: Cardiovascular;  Laterality: N/A;   TOTAL KNEE ARTHROPLASTY Right 09/2016     Social History:   reports that he quit smoking about 17 years ago. His smoking use included cigarettes. He has a 35.00 pack-year smoking history. He has been exposed to tobacco smoke. His smokeless tobacco use includes chew. He reports that he does not drink alcohol and does not use drugs.   Family History:  His family history includes Depression in his maternal grandfather and maternal grandmother; Heart attack in his father, mother, and another family member; Heart disease in his mother and another family member; Hypertension in his father and mother; Kidney disease in his brother. There is no history of Prostate cancer, Kidney cancer, Stomach cancer, or Colon  cancer.   Allergies Allergies  Allergen Reactions   Benzodiazepines     Paradoxical agitation and delirium   Codeine Nausea Only   Tetracycline Rash     Home Medications  Prior to Admission medications   Medication Sig Start Date End Date Taking? Authorizing Provider  apixaban (ELIQUIS) 5 MG TABS tablet Take 1 tablet (5 mg total) by mouth 2 (two) times daily. 11/16/22  Yes Dunn, Raymon Mutton, PA-C  buPROPion (WELLBUTRIN SR) 200 MG 12 hr tablet Take 1 tablet (200 mg total) by mouth 2 (two) times daily. 11/16/22  Yes Cannady, Corrie Dandy T, NP  Cholecalciferol 50 MCG (2000 UT) CAPS Take 1 capsule by mouth daily.    Yes [provider]  citalopram (CELEXA) 40 MG tablet Take 1 tablet (40 mg total) by mouth daily. 04/26/23  Yes Cannady, [name], NP  ezetimibe  (ZETIA) 10 MG tablet Take 1 tablet (10 mg total) by mouth daily. 03/28/23  Yes Cannady, Corrie Dandy T, NP  levothyroxine (SYNTHROID) 75 MCG tablet Take 1 tablet (75 mcg total) by mouth daily. 02/21/23  Yes Cannady, [name], NP  metoprolol succinate (TOPROL XL) 25 MG 24 hr tablet Take 0.5 tablets (12.5 mg total) by mouth daily. 11/10/22  Yes Dunn, Raymon Mutton, PA-C  omeprazole (PRILOSEC) 20 MG capsule Take 1 capsule (20 mg total) by mouth daily. 02/21/23  Yes Cannady, [name], NP  rosuvastatin (CRESTOR) 40 MG tablet Take 1 tablet (40 mg total) by mouth daily. 04/26/23  Yes Iran Ouch, MD  sacubitril-valsartan (ENTRESTO) 24-26 MG Take 1 tablet by mouth 2 (two) times daily. Patient not taking: Reported on 05/18/2023 10/14/22   Sondra Barges, PA-C     Critical care time: 45 minutes    Raechel Chute, MD [facility] 05/19/2023 8:37 AM

## 2023-05-19 NOTE — Consult Note (Signed)
Cardiology Consult    Patient ID: MYA CASERTA MRN: [id number], DOB/AGE: [birth year]   Admit date: 05/18/2023 Date of Consult: 05/19/2023  Primary Physician: Marjie Skiff, NP Primary Cardiologist: [name], MD Requesting Provider: Silverio Decamp, MD  Patient Profile    Stuart Harvey is a 72 y.o. male with a history of CAD s/p CABG x 4 in 2008 ([facility]), chronic HFrEF/ICM, PAF on eliquis, HTN, HL, infrarenal AAA s/p open repair 08/2007), R iliac artery aneurysm s/p EVAR 07/2021, hypothyroidism, anemia, depression, and GERD, who is being seen today for the evaluation of cardiac arrest at the request of [name].  Past Medical History   Past Medical History:  Diagnosis Date   AAA (abdominal aortic aneurysm) (HCC)    a.) s/p EVAR in 08/2007 by Dr. Earnestine Leys. b.) CTA on 06/30/2021 --> interval aneurysmal dilitation superior to stent graft with the juxtarenal aorta measuring 3.8 cm.   Anemia    Arthritis    both knees   Chronic anticoagulation    Apixaban   Chronic HFrEF (heart failure with reduced ejection fraction) (HCC)    a. 07/2022 Echo: EF 35-40%, glob HK, mild LVH, GrI DD, nl RV fxn, mildly dil RA/LA, mild MR, mild AoV sclerosis. Asc Ao 40mm.   CKD (chronic kidney disease), stage II    Coronary artery disease    a. 06/2007 CABGx4 @ [facility] (LIMA-LAD, VG-RPDA, VG-OM3,VG-D2); b. 2011 Cath: patent grafts EF 40%, c.06/05/18 Cath: Sev native dzs, LIMA-LAD patent, VG-RPDA patent, VG-OM3 mid-graft 60% and 80%; d. 07/2022 Cath: LM nl, LAD 50p, 90p/m, D1 70, RI  30, LCX 40ost/p, 100p CTO, OM3 100, RCA 100p CTO, LIMA->LAD ok, VG->D2 ok, VG->RPDA min irregs, VG->OM3 100->Med rx.   Depression    GERD (gastroesophageal reflux disease)    Hernia of abdominal cavity    History of 2019 novel coronavirus disease (COVID-19) 09/2019   Hyperlipidemia    Hypertension    Hypothyroidism    Iliac artery aneurysm, right (HCC)    a.) at the distal landing zone of the right iliac limb measuring 3 cm    Ischemic cardiomyopathy    Right middle lobe pulmonary nodule 06/30/2021   a.) measured 8 mm by CT on 06/30/2021.   S/P CABG x 4 06/25/2007   a.) LIMA-LAD, SVG-RPDA, SVG-OM3, SVG-D2   Sigmoid diverticulosis    Ventricular tachycardia (HCC) 05/2018   a.) found unresponsive by EMS; VT noted --> defibrillation achieved ROSC. Briefly intubated. Episode felt to be secondary to heat stoke.    Past Surgical History:  Procedure Laterality Date   ABDOMINAL AORTIC ANEURYSM REPAIR N/A 08/29/2007   Procedure: EVAR; Location: [facility]; Surgeon: Brynda Greathouse, MD   ABDOMINAL AORTIC ANEURYSM REPAIR N/A 12/05/2012   Procedure: Abdominal aortic aneurysm of approximately 6 cm in maximal diameter status post previous endovascular repair with type I and III endoleaks; Location: [facility]; Surgeon: Festus Barren, MD   CARDIAC CATHETERIZATION Left 06/28/2010   3v CAD with patent CABG grafts; LVEF 40%; stable aortic stent graft; Location: [facility]; Surgeon: Arnoldo Hooker, MD   CARDIAC CATHETERIZATION Left 06/18/2007   3v CAD; LVEF 50%; refer to CVTS for CABG; Location: [facility]; Surgeon: [name], MD   COLONOSCOPY WITH PROPOFOL N/A 11/17/2021   Procedure: COLONOSCOPY WITH PROPOFOL;  Surgeon: Toney Reil, MD;  Location: [facility] ENDOSCOPY;  Service: Gastroenterology;  Laterality: N/A;   CORONARY ARTERY BYPASS GRAFT N/A 06/25/2007   Procedure: 4v CABG (LIMA-LAD, SVG-RPDA, SVG-OM3, SVG-D2); Location: [facility]; Surgeon: Marshell Garfinkel, MD  CORONARY/GRAFT ANGIOGRAPHY N/A 06/05/2018   Procedure: CORONARY/GRAFT ANGIOGRAPHY;  Surgeon: Yvonne Kendall, MD;  Location: [facility] INVASIVE CV LAB;  Service: Cardiovascular;  Laterality: N/A;   ENDOVASCULAR REPAIR/STENT GRAFT N/A 08/04/2021   Procedure: ENDOVASCULAR REPAIR/STENT GRAFT;  Surgeon: Annice Needy, MD;  Location: [facility] INVASIVE CV LAB;  Service: Cardiovascular;  Laterality: N/A;   EXPLORATORY LAPAROTOMY  1997   HERNIA REPAIR     KNEE ARTHROPLASTY Left 09/19/2016   Procedure: COMPUTER  ASSISTED TOTAL KNEE ARTHROPLASTY;  Surgeon: Donato [name], MD;  Location: [facility] ORS;  Service: Orthopedics;  Laterality: Left;   RIGHT/LEFT HEART CATH AND CORONARY ANGIOGRAPHY N/A 08/05/2022   Procedure: RIGHT/LEFT HEART CATH AND CORONARY ANGIOGRAPHY;  Surgeon: Iran Ouch, MD;  Location: [facility] INVASIVE CV LAB;  Service: Cardiovascular;  Laterality: N/A;   TOTAL KNEE ARTHROPLASTY Right 09/2016     Allergies  Allergies  Allergen Reactions   Benzodiazepines     Paradoxical agitation and delirium   Codeine Nausea Only   Tetracycline Rash    History of Present Illness    72 y.o. male with a history of CAD s/p CABG x 4 in 2008 ([facility]), chronic HFrEF/ICM, PAF on eliquis, HTN, HL, infrarenal AAA s/p open repair 08/2007), R iliac artery aneurysm s/p EVAR 07/2021, hypothyroidism, anemia, depression, and GERD.  In 05/2018, he was hospitalized w/ unresponsiveness with reported downtime of about 1 hour possible seizure-like activity.  He had numerous contusions.  Per EMS, he was in ventricular tachycardia upon arrival and required defibrillation.  Troponin was elevated.  Echo showed EF of 50-55%.  Diagnostic catheterization showed severe multivessel coronary disease with patent grafts, though there was borderline disease noted in the vein graft to the obtuse marginal, which was medically managed.  Ultimately, presentation was felt to be secondary to recent stroke.  In August 2023, he had worsening dyspnea and left-sided chest pain.  Stress testing showed EF 38% with evidence of prior MI.  Subsequent echo confirmed depressed EF of 35 to 40% with global hypokinesis, and aortic gradient 43 mm.  He underwent right left heart cardiac catheterization which again showed severe multivessel native CAD with progression of dzs in the VG  OM3, resulting in occlusion of that vessel.  The LIMA  LAD, VG  D2, and VG  RPDA remained patent, and he was medically managed.  Mr. Sthilaire was doing well at his most recent cardiology  visit in April 2024.  On July 4, however, he was out in his garage, and his wife went out to ask him to get ready to go to their sons for a cookout, when she noticed him sitting on the floor holding his head.  He said he was feeling very dizzy.  He attempted to stand up but then fell forward and apparently lost consciousness.  His wife says she thought he was turning blue.  She did not assess for a pulse but did begin CPR after calling EMS.  She continued CPR uninterrupted until EMS arrived in about 5 or 6 minutes.  At that point, EMS continued CPR.  Per nursing notes, patient was pulseless on EMS arrival and was "blue from the chest up."  ROSC was achieved after an additional 4 minutes of CPR.  No medications were administered.  Patient was placed on a nonrebreather and taken to [facility].  He required intubation on arrival.  Initial ECG notable for atrial fibrillation at 180 bpm.  He was hypotensive.  HsTrop 21   40   101  116.  BUN/Creat elev above prior baseline @ 37/2.01.  BNP 513.6.  WBC 18.2.  K 5.7.  He was admitted to ICU.   Home CHF meds held.  Heparin started in lieu of eliquis. Echo this AM w/ EF of 40-45% and inflat HK.  Sedation off w/ plan for extubation.  Inpatient Medications     sodium chloride   Intravenous Once   Chlorhexidine Gluconate Cloth  6 each Topical Daily   docusate  100 mg Per Tube BID   furosemide       insulin aspart  0-9 Units Subcutaneous Q4H   levothyroxine  75 mcg Oral Q0600   pantoprazole (PROTONIX) IV  40 mg Intravenous QHS   polyethylene glycol  17 g Per Tube Daily    Family History    Family History  Problem Relation Age of Onset   Heart disease Mother    Heart attack Mother    Hypertension Mother    Heart attack Father    Hypertension Father    Kidney disease Brother    Depression Maternal Grandmother    Depression Maternal Grandfather    Heart disease Other    Heart attack Other    Prostate cancer Neg Hx    Kidney cancer Neg Hx    Stomach cancer Neg  Hx    Colon cancer Neg Hx    He indicated that his mother is deceased. He indicated that his father is deceased. He indicated that his brother is deceased. He indicated that his maternal grandmother is deceased. He indicated that his maternal grandfather is deceased. He indicated that his paternal grandmother is deceased. He indicated that his paternal grandfather is deceased. He indicated that both of his sons are alive. He indicated that the status of his neg hx is unknown. He indicated that the status of his other is unknown.   Social History    Social History   Socioeconomic History   Marital status: Married    Spouse name: Not on file   Number of children: Not on file   Years of education: 14   Highest education level: Some college, no degree  Occupational History   Occupation: retired  Tobacco Use   Smoking status: Former    Packs/day: 1.00    Years: 35.00    Additional pack years: 0.00    Total pack years: 35.00    Types: Cigarettes    Quit date: 12/16/2005    Years since quitting: 17.4    Passive exposure: Past   Smokeless tobacco: Current    Types: Chew   Tobacco comments:    pt states he very rarely uses smokeless tobacco   Vaping Use   Vaping Use: Never used  Substance and Sexual Activity   Alcohol use: No   Drug use: No   Sexual activity: Yes    Partners: Female  Other Topics Concern   Not on file  Social History Narrative   Not on file   Social Determinants of Health   Financial Resource Strain: Low Risk  (08/09/2022)   Overall Financial Resource Strain (CARDIA)    Difficulty of Paying Living Expenses: Not hard at all  Food Insecurity: No Food Insecurity (08/09/2022)   Hunger Vital Sign    Worried About Running Out of Food in the Last Year: Never true    Ran Out of Food in the Last Year: Never true  Transportation Needs: No Transportation Needs (08/09/2022)   PRAPARE - Transportation    Lack of Transportation (  Medical): No    Lack of Transportation  (Non-Medical): No  Physical Activity: Insufficiently Active (08/09/2022)   Exercise Vital Sign    Days of Exercise per Week: 3 days    Minutes of Exercise per Session: 30 min  Stress: No Stress Concern Present (08/09/2022)   [employer] of Occupational Health - Occupational Stress Questionnaire    Feeling of Stress : Not at all  Social Connections: Socially Integrated (08/09/2022)   Social Connection and Isolation Panel [NHANES]    Frequency of Communication with Friends and Family: More than three times a week    Frequency of Social Gatherings with Friends and Family: Once a week    Attends Religious Services: More than 4 times per year    Active Member of [employer] or Organizations: Yes    Attends Banker Meetings: Never    Marital Status: Married  Catering manager Violence: Not At Risk (08/09/2022)   Humiliation, Afraid, Rape, and Kick questionnaire    Fear of Current or Ex-Partner: No    Emotionally Abused: No    Physically Abused: No    Sexually Abused: No     Review of Systems - pt intubated, unable to provide at time of my interview    ----  Physical Exam    Blood pressure 94/64, pulse 95, temperature 99.2 F (37.3 C), resp. rate (!) 23, height 6' (1.829 m), weight 95.4 kg, SpO2 90 %.  General: Pleasant, NAD.  Intubated, awake. Psych: Normal affect. Neuro: Moves all extremities spontaneously. HEENT: Normal  Neck: Supple without bruits or JVD. Lungs:  Resp regular and unlabored, CTA. Heart: RRR, tachy, no s3, s4, or murmurs. Abdomen: Soft, non-tender, non-distended, BS + x 4.  Extremities: No clubbing, cyanosis or edema. DP/PT2+, Radials 2+ and equal bilaterally.  Labs    Cardiac Enzymes Recent Labs  Lab 05/18/23 1627 05/18/23 1820 05/18/23 2247 05/19/23 0211  TROPONINIHS 21* 40* 101* 116*     BNP    Component Value Date/Time   BNP 513.5 (H) 05/18/2023 1627   Lab Results  Component Value Date   WBC 14.6 (H) 05/19/2023   HGB 13.2 05/19/2023    HCT 41.4 05/19/2023   MCV 94.3 05/19/2023   PLT 179 05/19/2023    Recent Labs  Lab 05/18/23 1627 05/18/23 2053 05/19/23 0444  NA 139   < > 141  K 5.7*   < > 4.7  CL 112*   < > 113*  CO2 10*   < > 22  BUN 37*   < > 34*  CREATININE 2.01*   < > 1.65*  CALCIUM 9.7   < > 8.5*  PROT 7.5  --   --   BILITOT 1.0  --   --   ALKPHOS 103  --   --   ALT 20  --   --   AST 43*  --   --   GLUCOSE 160*   < > 101*   < > = values in this interval not displayed.   Lab Results  Component Value Date   CHOL 146 01/11/2023   HDL 52 01/11/2023   LDLCALC 76 01/11/2023   TRIG 120 05/19/2023    Radiology Studies    DG Chest Port 1 View  Result Date: 05/19/2023 CLINICAL DATA:  Acute respiratory failure with hypoxia. Status post cardiac arrest. EXAM: PORTABLE CHEST 1 VIEW COMPARISON:  05/18/2023 FINDINGS: There is a right IJ catheter with tip in the projection of the cavoatrial junction. Interval extubation.  Signs of previous median sternotomy and CABG procedure. As noted previously there is fracture of the proximal 3 sternotomy wires. Cardiomediastinal contours are unremarkable. Trace interstitial edema with thickening of the peripheral septal lines in the left lung. No pleural effusion or airspace consolidation. IMPRESSION: 1. Interval extubation. 2. Trace interstitial edema. Electronically Signed   By: Signa Kell M.D.   On: 05/19/2023 12:30   DG Chest Port 1 View  Result Date: 05/18/2023 CLINICAL DATA:  Central line EXAM: PORTABLE CHEST 1 VIEW COMPARISON:  05/18/2023 FINDINGS: Endotracheal tube tip is about 3 cm superior to carina. Esophageal tube tip below the diaphragm but incompletely visualized. Post sternotomy changes. Right IJ central venous catheter tip over the SVC. No visible pneumothorax. Stable cardiomediastinal silhouette. Patchy atelectasis left base. IMPRESSION: Right IJ central venous catheter tip over the SVC. No visible pneumothorax. Electronically Signed   By: Jasmine Pang M.D.    On: 05/18/2023 22:45   CT Angio Chest/Abd/Pel for Dissection W and/or Wo Contrast  Result Date: 05/18/2023 CLINICAL DATA:  Post cardiac arrest EXAM: CT ANGIOGRAPHY CHEST, ABDOMEN AND PELVIS TECHNIQUE: Non-contrast CT of the chest was initially obtained. Multidetector CT imaging through the chest, abdomen and pelvis was performed using the standard protocol during bolus administration of intravenous contrast. Multiplanar reconstructed images and MIPs were obtained and reviewed to evaluate the vascular anatomy. RADIATION DOSE REDUCTION: This exam was performed according to the departmental dose-optimization program which includes automated exposure control, adjustment of the mA and/or kV according to patient size and/or use of iterative reconstruction technique. CONTRAST:  OMNIPAQUE IOHEXOL 350 MG/ML SOLN COMPARISON:  None Available. FINDINGS: CTA CHEST FINDINGS VASCULAR Aorta: Satisfactory opacification of the aorta. Normal contour and caliber of the thoracic aorta. No evidence of aneurysm, dissection, or other acute aortic pathology. Moderate mixed calcific atherosclerosis Cardiovascular: No evidence of pulmonary embolism on limited non-tailored examination. Cardiomegaly. Three-vessel coronary artery calcifications status post median sternotomy and CABG. No pericardial effusion. Review of the MIP images confirms the above findings. NON VASCULAR Mediastinum/Nodes: No enlarged mediastinal, hilar, or axillary lymph nodes. Thyroid gland, trachea, and esophagus demonstrate no significant findings. Lungs/Pleura: Endotracheal intubation, tip above the carina. Dependent bibasilar scarring or atelectasis. No pleural effusion or pneumothorax. Musculoskeletal: No chest wall abnormality. Numerous nondisplaced fractures of the anterior ribs (series 6, image 61). Review of the MIP images confirms the above findings. CTA ABDOMEN AND PELVIS FINDINGS VASCULAR Status post aortobiiliac stent endograft repair of abdominal  aortic aneurysm. No acute findings. Moderate mixed calcific atherosclerosis. Standard branching pattern of the abdominal aorta with solitary bilateral renal arteries. Review of the MIP images confirms the above findings. NON-VASCULAR Hepatobiliary: No solid liver abnormality is seen. No gallstones, gallbladder wall thickening, or biliary dilatation. Pancreas: Unremarkable. No pancreatic ductal dilatation or surrounding inflammatory changes. Spleen: Normal in size without significant abnormality. Adrenals/Urinary Tract: Adrenal glands are unremarkable. Numerous bilateral renal cortical cysts, more numerous on the left, without obvious solid mass or suspicious contrast enhancement. No hydronephrosis. Kidneys are otherwise normal, without renal calculi, solid lesion, or hydronephrosis. Bladder is unremarkable. Stomach/Bowel: Esophagogastric tube with tip and side port below the diaphragm. Stomach is within normal limits. Appendix appears normal. No evidence of bowel wall thickening, distention, or inflammatory changes. Sigmoid diverticulosis Lymphatic: No enlarged abdominal or pelvic lymph nodes. Reproductive: No mass or other significant abnormality. Other: No abdominal wall hernia or abnormality. No ascites. Musculoskeletal: No acute osseous findings. IMPRESSION: 1. No evidence of thoracic aortic aneurysm, dissection, or other acute aortic pathology. 2. Status  post aortobiiliac stent endograft repair of the abdominal aorta without acute findings. 3. No evidence of pulmonary embolism on limited non-tailored examination. 4. Numerous nondisplaced fractures of the anterior ribs. No pneumothorax. 5. Cardiomegaly and coronary artery disease status post median sternotomy and CABG. 6. Sigmoid diverticulosis without evidence of acute diverticulitis. Aortic Atherosclerosis (ICD10-I70.0). Electronically Signed   By: Jearld Lesch M.D.   On: 05/18/2023 17:46   CT Head Wo Contrast  Result Date: 05/18/2023 CLINICAL DATA:   Cardiac arrest. EXAM: CT HEAD WITHOUT CONTRAST TECHNIQUE: Contiguous axial images were obtained from the base of the skull through the vertex without intravenous contrast. RADIATION DOSE REDUCTION: This exam was performed according to the departmental dose-optimization program which includes automated exposure control, adjustment of the mA and/or kV according to patient size and/or use of iterative reconstruction technique. COMPARISON:  MRI brain 11/23/2022 FINDINGS: Brain: There is no evidence for acute hemorrhage, hydrocephalus, mass lesion, or abnormal extra-axial fluid collection. No definite CT evidence for acute infarction. Vascular: No hyperdense vessel or unexpected calcification. Skull: No evidence for fracture. No worrisome lytic or sclerotic lesion. Sinuses/Orbits: The visualized paranasal sinuses and mastoid air cells are clear. Visualized portions of the globes and intraorbital fat are unremarkable. Other: Gas is identified in venous anatomy of the lower head bilaterally, likely reflecting IV access. IMPRESSION: 1. No acute intracranial abnormality. 2. Gas in superficial venous anatomy of the lower head bilaterally, likely reflecting IV access. Electronically Signed   By: Kennith Center M.D.   On: 05/18/2023 17:35   DG Chest Port 1 View  Result Date: 05/18/2023 CLINICAL DATA:  Intubation EXAM: PORTABLE CHEST 1 VIEW COMPARISON:  06/03/2018 FINDINGS: Endotracheal tube with the tip 6.6 cm above the carina. Nasogastric tube with the tip projecting over the stomach. No focal consolidation. No pleural effusion or pneumothorax. Heart and mediastinal contours are unremarkable. Prior CABG. No acute osseous abnormality. IMPRESSION: 1. Endotracheal tube with the tip 6.6 cm above the carina. Electronically Signed   By: Elige Ko M.D.   On: 05/18/2023 17:11    ECG & Cardiac Imaging    Afib, 180, LAD, IVCD, anterolateral infarct - personally reviewed. Afib, 112, LAD, IVCD, anterolateral infarct - personally  reviewed  Tele - atrial flutter, low 100's  Assessment & Plan    1.  Cardiac arrest:  pt with witnessed loss of consciousness by wife w/ immediate bystander CPR.  Per ED notes, EMS reported that pt was pulseless upon their arrival but ROSC achieved after 4 additional mins of CPR.  Did not require ACLS medications.  Afib w/ RVR upon arrival to ED.  HsTrops mildly elevated 21   40   101   116.  Echo this AM w/ sl improvement in EF to 40% w/ inferolateral HK.  Does not appear to be a primarily ischemic event.  Pt to be extubated by CCM today.  Will ask EP to see pt on Monday.  See below re: mgmt of atrial arrhythmias.  2.  Afib w/ RVR/Atrial flutter:  pt presented in afib w/ RVR @ 180.  He has since been in aflutter @ 109.  Pressures soft (90's) but stable.  Chronic eliquis rx as outpt w/ good compliance per wife.  Loading w/ IV amio today.  If he remains in atrial flutter on Monday, will plan to cardiovert (discussed w/ specials and OR - he is on schedule for 7:30 AM).  Cont heparin for now.  3.  ICM/HFrEF:  EF 40%.  Appears euvolemic.  Home  meds held in setting of hypotension.  Follow.  4.  CAD/Demand ischemia:  s/p prior CABG w/ 3/4 patent grafts and known multivessel dzs on cath in 07/2022.  Wife unaware of any recent bouts of c/p.  Will be able to eval further once he's extubated, more awake.  HsTrop mildly elevated 21   40   101   116.  As above, Echo w/ EF of 40%, inflat HK.  Defer ischemic eval @ this time.  EP to be involved, may still ultimately require cath to r/o progression of dzs that might have contributed to presumed arrhythmia/arrest.  Once taking POs, plan to resume statin.  5.  Hypotension:  home meds held.  Pressures remain soft.  Follow.  6.  HL:  plan to resume statin once taking POs.  7.  Acute resp failure:  vent mgmt/abx per CCM.  8.  AKI/CKD III:  creat improved this AM.  Follow.  9.  Hyperkalemia:  resolved.  Risk Assessment/Risk Scores:          CHA2DS2-VASc Score  = 4   This indicates a 4.8% annual risk of stroke. The patient's score is based upon: CHF History: 1 HTN History: 1 Diabetes History: 0 Stroke History: 0 Vascular Disease History: 1 Age Score: 1 Gender Score: 0     Signed, Nicolasa Ducking, NP 05/19/2023, 3:34 PM  For questions or updates, please contact   Please consult [email or web address] for contact info under Cardiology/STEMI.

## 2023-05-19 NOTE — Progress Notes (Signed)
ANTICOAGULATION CONSULT NOTE  Pharmacy Consult for heparin infusion Indication: atrial fibrillation  Allergies  Allergen Reactions   Benzodiazepines     Paradoxical agitation and delirium   Codeine Nausea Only   Tetracycline Rash    Patient Measurements: Height: 6' (182.9 cm) Weight: 95.4 kg (210 lb 5.1 oz) IBW/kg (Calculated) : 77.6 Heparin Dosing Weight: 102 kg  Vital Signs: Temp: 99.7 F (37.6 C) (07/05 1700) BP: 94/67 (07/05 1800) Pulse Rate: 103 (07/05 1800)  Labs: Recent Labs    05/18/23 1627 05/18/23 1820 05/18/23 2053 05/18/23 2247 05/19/23 0211 05/19/23 0444 05/19/23 1741  HGB 15.5  --   --   --   --  13.2  --   HCT 48.3  --   --   --   --  41.4  --   PLT 201  --   --   --   --  179  --   APTT  --   --   --   --   --   --  140*  LABPROT 49.6*  --  16.5*  --   --   --   --   INR 5.4*  --  1.3*  --   --   --   --   CREATININE 2.01*  --  1.69*  --   --  1.65*  --   TROPONINIHS 21* 40*  --  101* 116*  --   --      Estimated Creatinine Clearance: 49.2 mL/min (A) (by C-G formula based on SCr of 1.65 mg/dL (H)).   Medical History: Past Medical History:  Diagnosis Date   AAA (abdominal aortic aneurysm) (HCC)    a.) s/p EVAR in 08/2007 by Dr. Earnestine Leys. b.) CTA on 06/30/2021 --> interval aneurysmal dilitation superior to stent graft with the juxtarenal aorta measuring 3.8 cm.   Anemia    Arthritis    both knees   Chronic anticoagulation    Apixaban   Chronic HFrEF (heart failure with reduced ejection fraction) (HCC)    a. 07/2022 Echo: EF 35-40%, glob HK, mild LVH, GrI DD, nl RV fxn, mildly dil RA/LA, mild MR, mild AoV sclerosis. Asc Ao 40mm.   CKD (chronic kidney disease), stage II    Coronary artery disease    a. 06/2007 CABGx4 @ [facility] (LIMA-LAD, VG-RPDA, VG-OM3,VG-D2); b. 2011 Cath: patent grafts EF 40%, c.06/05/18 Cath: Sev native dzs, LIMA-LAD patent, VG-RPDA patent, VG-OM3 mid-graft 60% and 80%; d. 07/2022 Cath: LM nl, LAD 50p, 90p/m, D1 70, RI  30, LCX  40ost/p, 100p CTO, OM3 100, RCA 100p CTO, LIMA->LAD ok, VG->D2 ok, VG->RPDA min irregs, VG->OM3 100->Med rx.   Depression    GERD (gastroesophageal reflux disease)    Hernia of abdominal cavity    History of 2019 novel coronavirus disease (COVID-19) 09/2019   Hyperlipidemia    Hypertension    Hypothyroidism    Iliac artery aneurysm, right (HCC)    a.) at the distal landing zone of the right iliac limb measuring 3 cm   Ischemic cardiomyopathy    Right middle lobe pulmonary nodule 06/30/2021   a.) measured 8 mm by CT on 06/30/2021.   S/P CABG x 4 06/25/2007   a.) LIMA-LAD, SVG-RPDA, SVG-OM3, SVG-D2   Sigmoid diverticulosis    Ventricular tachycardia (HCC) 05/2018   a.) found unresponsive by EMS; VT noted --> defibrillation achieved ROSC. Briefly intubated. Episode felt to be secondary to heat stoke.    Medications:  Scheduled:   sodium chloride  Intravenous Once   [START ON 05/20/2023] aspirin EC  81 mg Oral Daily   Chlorhexidine Gluconate Cloth  6 each Topical Daily   docusate  100 mg Per Tube BID   furosemide       insulin aspart  0-9 Units Subcutaneous Q4H   levothyroxine  75 mcg Oral Q0600   pantoprazole (PROTONIX) IV  40 mg Intravenous QHS   polyethylene glycol  17 g Per Tube Daily    Assessment: 72 y.o. male with a history of CAD status post CABG, HFrEF, AAA status post surgical repair in 2008, right iliac artery aneurysm status post endovascular repair in 2022, atrial fibrillation on Eliquis, CKD, hypertension, hyperlipidemia, hypothyroidism, anemia, GERD, and depression who presents with altered mental status after out of hospital cardiac arrest w/ ROSC after ~ 4 minutes.   Goal of Therapy:  Heparin level 0.3-0.7 units/ml aPTT 66 - 102 seconds Monitor platelets by anticoagulation protocol: Yes   Plan: aPTT 140 - supratherapeutic. Regardless, heparin held @1800  due to pink tinged urine. Per PCCM providers, hold heparin overnight. Reassess 7/6 AM ---Start heparin infusion  at 1100 units/hr ---Continue to monitor H&H and platelets  Elliot Gurney, PharmD, BCPS Clinical Pharmacist  05/19/2023 6:28 PM

## 2023-05-19 NOTE — Plan of Care (Signed)
Discussed with patient in front of family plan of care for the evening, pain management and drinking with some teach back displayed.  What is important to the patient is rest tonight.    Problem: Education: Goal: Knowledge of General Education information will improve Description: Including pain rating scale, medication(s)/side effects and non-pharmacologic comfort measures Outcome: Progressing   Problem: Health Behavior/Discharge Planning: Goal: Ability to manage health-related needs will improve Outcome: Progressing   Problem: Pain Managment: Goal: General experience of comfort will improve Outcome: Progressing

## 2023-05-20 ENCOUNTER — Inpatient Hospital Stay: Payer: Medicare HMO

## 2023-05-20 DIAGNOSIS — I959 Hypotension, unspecified: Secondary | ICD-10-CM

## 2023-05-20 DIAGNOSIS — R4182 Altered mental status, unspecified: Secondary | ICD-10-CM | POA: Diagnosis not present

## 2023-05-20 DIAGNOSIS — I469 Cardiac arrest, cause unspecified: Secondary | ICD-10-CM | POA: Diagnosis not present

## 2023-05-20 DIAGNOSIS — I483 Typical atrial flutter: Secondary | ICD-10-CM

## 2023-05-20 DIAGNOSIS — J9600 Acute respiratory failure, unspecified whether with hypoxia or hypercapnia: Secondary | ICD-10-CM | POA: Diagnosis not present

## 2023-05-20 LAB — CBC
HCT: 38.3 % — ABNORMAL LOW (ref 39.0–52.0)
Hemoglobin: 12.3 g/dL — ABNORMAL LOW (ref 13.0–17.0)
MCH: 30.1 pg (ref 26.0–34.0)
MCHC: 32.1 g/dL (ref 30.0–36.0)
MCV: 93.6 fL (ref 80.0–100.0)
Platelets: 113 10*3/uL — ABNORMAL LOW (ref 150–400)
RBC: 4.09 MIL/uL — ABNORMAL LOW (ref 4.22–5.81)
RDW: 14.8 % (ref 11.5–15.5)
WBC: 13.1 10*3/uL — ABNORMAL HIGH (ref 4.0–10.5)
nRBC: 0 % (ref 0.0–0.2)

## 2023-05-20 LAB — BASIC METABOLIC PANEL
Anion gap: 9 (ref 5–15)
Anion gap: 7 (ref 5–15)
BUN: 26 mg/dL — ABNORMAL HIGH (ref 8–23)
BUN: 24 mg/dL — ABNORMAL HIGH (ref 8–23)
CO2: 23 mmol/L (ref 22–32)
CO2: 24 mmol/L (ref 22–32)
Calcium: 8.4 mg/dL — ABNORMAL LOW (ref 8.9–10.3)
Calcium: 8 mg/dL — ABNORMAL LOW (ref 8.9–10.3)
Chloride: 105 mmol/L (ref 98–111)
Chloride: 104 mmol/L (ref 98–111)
Creatinine, Ser: 1.57 mg/dL — ABNORMAL HIGH (ref 0.61–1.24)
Creatinine, Ser: 1.48 mg/dL — ABNORMAL HIGH (ref 0.61–1.24)
GFR, Estimated: 47 mL/min — ABNORMAL LOW (ref >60)
GFR, Estimated: 50 mL/min — ABNORMAL LOW (ref >60)
Glucose, Bld: 108 mg/dL — ABNORMAL HIGH (ref 70–99)
Glucose, Bld: 136 mg/dL — ABNORMAL HIGH (ref 70–99)
Potassium: 3.5 mmol/L (ref 3.5–5.1)
Potassium: 3.4 mmol/L — ABNORMAL LOW (ref 3.5–5.1)
Sodium: 137 mmol/L (ref 135–145)
Sodium: 135 mmol/L (ref 135–145)

## 2023-05-20 LAB — APTT: aPTT: 70 seconds — ABNORMAL HIGH (ref 24–36)

## 2023-05-20 LAB — HEMOGLOBIN A1C
Hgb A1c MFr Bld: 5.7 % — ABNORMAL HIGH (ref 4.8–5.6)
Mean Plasma Glucose: 116.89 mg/dL

## 2023-05-20 LAB — MRSA NEXT GEN BY PCR, NASAL: MRSA by PCR Next Gen: NOT DETECTED

## 2023-05-20 LAB — BLOOD GAS, ARTERIAL
Bicarbonate: 17 mmol/L — ABNORMAL LOW (ref 20.0–28.0)
Mechanical Rate: 15
O2 Saturation: 99.4 %
PEEP: 8 cmH2O
pCO2 arterial: 33 mmHg (ref 32–48)
pO2, Arterial: 130 mmHg — ABNORMAL HIGH (ref 83–108)

## 2023-05-20 LAB — GLUCOSE, CAPILLARY
Glucose-Capillary: 108 mg/dL — ABNORMAL HIGH (ref 70–99)
Glucose-Capillary: 123 mg/dL — ABNORMAL HIGH (ref 70–99)
Glucose-Capillary: 129 mg/dL — ABNORMAL HIGH (ref 70–99)
Glucose-Capillary: 122 mg/dL — ABNORMAL HIGH (ref 70–99)
Glucose-Capillary: 125 mg/dL — ABNORMAL HIGH (ref 70–99)

## 2023-05-20 LAB — HEPARIN LEVEL (UNFRACTIONATED): Heparin Unfractionated: 0.67 IU/mL (ref 0.30–0.70)

## 2023-05-20 LAB — MAGNESIUM
Magnesium: 2 mg/dL (ref 1.7–2.4)
Magnesium: 2 mg/dL (ref 1.7–2.4)

## 2023-05-20 MED ORDER — INSULIN ASPART 100 UNIT/ML IJ SOLN
0.0000 [IU] | Freq: Every day | INTRAMUSCULAR | Status: DC
  Administered 2023-05-20: 1 [IU] via SUBCUTANEOUS
  Filled 2023-05-20: qty 1

## 2023-05-20 MED ORDER — POTASSIUM CHLORIDE CRYS ER 20 MEQ PO TBCR
40.0000 meq | EXTENDED_RELEASE_TABLET | Freq: Once | ORAL | Status: DC
Start: 2023-05-20 — End: 2023-05-20

## 2023-05-20 MED ORDER — INSULIN ASPART 100 UNIT/ML IJ SOLN
0.0000 [IU] | Freq: Three times a day (TID) | INTRAMUSCULAR | Status: DC
  Administered 2023-05-20 – 2023-05-21 (×3): 1 [IU] via SUBCUTANEOUS
  Filled 2023-05-20 (×3): qty 1

## 2023-05-20 MED ORDER — ROSUVASTATIN CALCIUM 10 MG PO TABS
40.0000 mg | ORAL_TABLET | Freq: Every day | ORAL | Status: DC
  Administered 2023-05-20 – 2023-05-25 (×6): 40 mg via ORAL
  Filled 2023-05-20 (×7): qty 4

## 2023-05-20 MED ORDER — OXYCODONE HCL 5 MG PO TABS
5.0000 mg | ORAL_TABLET | Freq: Four times a day (QID) | ORAL | Status: DC | PRN
  Administered 2023-05-20: 10 mg via ORAL
  Administered 2023-05-21 (×3): 5 mg via ORAL
  Filled 2023-05-20 (×2): qty 1
  Filled 2023-05-20 (×2): qty 2
  Filled 2023-05-20: qty 1

## 2023-05-20 MED ORDER — HEPARIN (PORCINE) 25000 UT/250ML-% IV SOLN
900.0000 [IU]/h | INTRAVENOUS | Status: DC
  Administered 2023-05-20 – 2023-05-21 (×3): 900 [IU]/h via INTRAVENOUS
  Filled 2023-05-20 (×2): qty 250

## 2023-05-20 MED ORDER — POTASSIUM CHLORIDE 20 MEQ PO PACK
40.0000 meq | PACK | Freq: Once | ORAL | Status: AC
  Administered 2023-05-20: 40 meq via ORAL
  Filled 2023-05-20: qty 2

## 2023-05-20 MED ORDER — ACETAMINOPHEN 10 MG/ML IV SOLN
1000.0000 mg | Freq: Four times a day (QID) | INTRAVENOUS | Status: DC
  Filled 2023-05-20: qty 100

## 2023-05-20 MED ORDER — CITALOPRAM HYDROBROMIDE 20 MG PO TABS
40.0000 mg | ORAL_TABLET | Freq: Every day | ORAL | Status: DC
  Administered 2023-05-20 – 2023-05-22 (×3): 40 mg via ORAL
  Filled 2023-05-20 (×4): qty 2

## 2023-05-20 MED ORDER — FUROSEMIDE 10 MG/ML IJ SOLN
40.0000 mg | Freq: Once | INTRAMUSCULAR | Status: AC
  Administered 2023-05-20: 40 mg via INTRAVENOUS
  Filled 2023-05-20: qty 4

## 2023-05-20 MED ORDER — MELATONIN 5 MG PO TABS
2.5000 mg | ORAL_TABLET | Freq: Every day | ORAL | Status: DC
  Administered 2023-05-20: 2.5 mg via ORAL
  Filled 2023-05-20: qty 0.5

## 2023-05-20 MED ORDER — IPRATROPIUM-ALBUTEROL 0.5-2.5 (3) MG/3ML IN SOLN
3.0000 mL | Freq: Four times a day (QID) | RESPIRATORY_TRACT | Status: DC | PRN
  Administered 2023-05-22 – 2023-05-24 (×3): 3 mL via RESPIRATORY_TRACT
  Filled 2023-05-20 (×3): qty 3

## 2023-05-20 MED ORDER — POTASSIUM CHLORIDE CRYS ER 20 MEQ PO TBCR
40.0000 meq | EXTENDED_RELEASE_TABLET | Freq: Once | ORAL | Status: AC
  Administered 2023-05-20: 40 meq via ORAL
  Filled 2023-05-20: qty 2

## 2023-05-20 MED ORDER — ACETAMINOPHEN 10 MG/ML IV SOLN
1000.0000 mg | Freq: Four times a day (QID) | INTRAVENOUS | Status: AC
  Administered 2023-05-20 – 2023-05-21 (×4): 1000 mg via INTRAVENOUS
  Filled 2023-05-20 (×4): qty 100

## 2023-05-20 MED ORDER — AMIODARONE IV BOLUS ONLY 150 MG/100ML
150.0000 mg | Freq: Once | INTRAVENOUS | Status: AC
  Administered 2023-05-20: 150 mg via INTRAVENOUS
  Filled 2023-05-20: qty 100

## 2023-05-20 MED ORDER — BUPROPION HCL ER (SR) 150 MG PO TB12
150.0000 mg | ORAL_TABLET | Freq: Two times a day (BID) | ORAL | Status: DC
  Administered 2023-05-20 (×2): 150 mg via ORAL
  Filled 2023-05-20 (×3): qty 1

## 2023-05-20 NOTE — Evaluation (Signed)
Occupational Therapy Evaluation Patient Details Name: Stuart Harvey MRN: [id number] DOB: 09-May-1951 Today's Date: 05/20/2023   History of Present Illness [place] "Stuart Harvey is a 72 y/o male presented to ED on 05/18/23 for cardiac arrest s/p CPR with ROSC after ~4 minutes. Intubated 7/4-7/5. PMH: CAD s/p CABG, HFrEF, AAA s/p surgical repair in 2008, R iliac artery aneurysm s/p endovascular repair in 2022, Afib on Eliquis, CKD, HTN, anemia, depression.   Clinical Impression   [name] was seen for OT evaluation this date. Prior to hospital admission, pt was active and independent. Pt lives with his spouse in a 1 level home with at least 4 STE at each entrance. Pt presents to acute OT demonstrating impaired ADL performance and functional mobility 2/2 generalized weakness, impaired cardiopulmonary status, and decreased activity tolerance (See OT problem list for additional functional deficits). Pt currently requires MOD A for LB ADL management, MIN GUARD for brief functional transfers with a RW, and SET UP assist for UB ADL management.  Pt would benefit from skilled OT services to address noted impairments and functional limitations (see below for any additional details) in order to maximize safety and independence while minimizing falls risk and caregiver burden. Anticipate the need for follow up OT services upon acute hospital DC.       Recommendations for follow up therapy are one component of a multi-disciplinary discharge planning process, led by the attending physician.  Recommendations may be updated based on patient status, additional functional criteria and insurance authorization.   Assistance Recommended at Discharge Intermittent Supervision/Assistance  Patient can return home with the following A little help with walking and/or transfers;A little help with bathing/dressing/bathroom;Help with stairs or ramp for entrance;Assist for transportation;Assistance with cooking/housework     Functional Status Assessment  Patient has had a recent decline in their functional status and demonstrates the ability to make significant improvements in function in a reasonable and predictable amount of time.  Equipment Recommendations  None recommended by OT    Recommendations for Other Services       Precautions / Restrictions Precautions Precautions: Fall Restrictions Weight Bearing Restrictions: No      Mobility Bed Mobility Overal bed mobility: Needs Assistance Bed Mobility: Supine to Sit     Supine to sit: Supervision, HOB elevated     General bed mobility comments: Increased time/effort to perform with assist for mgt of lines/leads    Transfers Overall transfer level: Needs assistance Equipment used: Rolling walker (2 wheels) Transfers: Sit to/from Stand Sit to Stand: Min guard, +2 safety/equipment, From elevated surface           General transfer comment: +2 assist for line/lead mgt.      Balance Overall balance assessment: Needs assistance Sitting-balance support: Feet supported, No upper extremity supported Sitting balance-Leahy Scale: Good Sitting balance - Comments: steady sitting, reaching inside BOS. No LOB with weight shift during LB dressing.   Standing balance support: Reliant on assistive device for balance, Bilateral upper extremity supported Standing balance-Leahy Scale: Fair                             ADL either performed or assessed with clinical judgement   ADL Overall ADL's : Needs assistance/impaired                                       General ADL  Comments: Pt is functionally limited by cardiopulmonary status and decreased activity tolerance. He requires MOD A to don bilat hospital socks at EOB with frequent rest breaks and cues to implement ECS. SUPERVISION for bed mobility and to take steps to turn to recliner.     Vision Patient Visual Report: No change from baseline       Perception      Praxis      Pertinent Vitals/Pain Pain Assessment Pain Assessment: No/denies pain     Hand Dominance Right   Extremity/Trunk Assessment Upper Extremity Assessment Upper Extremity Assessment: Overall WFL for tasks assessed   Lower Extremity Assessment Lower Extremity Assessment: Generalized weakness   Cervical / Trunk Assessment Cervical / Trunk Assessment: Normal   Communication Communication Communication: No difficulties   Cognition Arousal/Alertness: Awake/alert Behavior During Therapy: WFL for tasks assessed/performed Overall Cognitive Status: Within Functional Limits for tasks assessed                                       General Comments  Pt on 50L HFNC 80% FiO2. SpO2 remains [facility] t/o session. He is noted to be at 90% at start of session while in bed. This improves with PLB, and pt left as recieved and O2 sats from 95-96% by end of session.    Exercises Other Exercises Other Exercises: Pt/family educated on role of OT in acute setting, energy conservation strategies, and safe transfer techniques.   Shoulder Instructions      Home Living Family/patient expects to be discharged to:: Private residence Living Arrangements: Spouse/significant other Available Help at Discharge: Family;Available 24 hours/day Type of Home: House Home Access: Stairs to enter [employer] of Steps: ~4-6 Entrance Stairs-Rails: None (None on main entrance) Home Layout: One level     Bathroom Shower/Tub: Producer, television/film/video: Standard     Home Equipment: None          Prior Functioning/Environment Prior Level of Function : Independent/Modified Independent;Driving             Mobility Comments: I without AE, denies falls. ADLs Comments: Independent with all ADL/IADL management.        OT Problem List: Decreased strength;Cardiopulmonary status limiting activity;Decreased activity tolerance;Decreased knowledge of use of DME or  AE;Impaired balance (sitting and/or standing)      OT Treatment/Interventions: Self-care/ADL training;Therapeutic exercise;Therapeutic activities;DME and/or AE instruction;Patient/family education;Balance training;Energy conservation    OT Goals(Current goals can be found in the care plan section) Acute Rehab OT Goals Patient Stated Goal: to feel better OT Goal Formulation: With patient Time For Goal Achievement: 06/03/23 Potential to Achieve Goals: Good  OT Frequency: Min 1X/week    Co-evaluation PT/OT/SLP Co-Evaluation/Treatment: Yes Reason for Co-Treatment: Complexity of the patient's impairments (multi-system involvement);For patient/therapist safety;To address functional/ADL transfers PT goals addressed during session: Mobility/safety with mobility;Balance OT goals addressed during session: ADL's and self-care;Proper use of Adaptive equipment and DME      AM-PAC OT "6 Clicks" Daily Activity     Outcome Measure Help from another person eating meals?: None Help from another person taking care of personal grooming?: A Little Help from another person toileting, which includes using toliet, bedpan, or urinal?: A Little Help from another person bathing (including washing, rinsing, drying)?: A Little Help from another person to put on and taking off regular upper body clothing?: A Little Help from another person to put on and taking off  regular lower body clothing?: A Little 6 Click Score: 19   End of Session Equipment Utilized During Treatment: Oxygen Nurse Communication: Mobility status;Other (comment) (VSS t/o session)  Activity Tolerance: Patient tolerated treatment well Patient left: in chair;with call bell/phone within reach;with family/visitor present  OT Visit Diagnosis: Other abnormalities of gait and mobility (R26.89);Muscle weakness (generalized) (M62.81)                Time: 7846-9629 OT Time Calculation (min): 27 min Charges:  OT General Charges $OT Visit: 1  Visit OT Evaluation $OT Eval Moderate Complexity: 1 Mod  Rockney Ghee, M.S., OTR/L 05/20/23, 12:24 PM

## 2023-05-20 NOTE — Progress Notes (Signed)
Cardiology Progress Note   Patient Name: Stuart Harvey Date of Encounter: 05/20/2023  Primary Cardiologist: [name], MD  Subjective   Extubated yesterday.  Now on HFNC.  C/o feeling hot this AM, low grade fever.  Recalls some events leading to syncope on 7/4 - was in garage working, became sob and LH.  No chest pain or palpitations.  Doesn't remember wife coming into garage or losing consciousness.    Remains in aflutter this AM - rates higher now that he's awake, trending 120's to 130's.  Asymptomatic.  Family at bedside.  Inpatient Medications    Scheduled Meds:  sodium chloride   Intravenous Once   aspirin EC  81 mg Oral Daily   buPROPion ER  150 mg Oral BID   Chlorhexidine Gluconate Cloth  6 each Topical Daily   citalopram  40 mg Oral Daily   docusate  100 mg Per Tube BID   insulin aspart  0-9 Units Subcutaneous Q4H   levothyroxine  75 mcg Oral Q0600   midodrine  5 mg Oral TID WC   pantoprazole (PROTONIX) IV  40 mg Intravenous QHS   polyethylene glycol  17 g Per Tube Daily   Continuous Infusions:  sodium chloride Stopped (05/18/23 2300)   sodium chloride Stopped (05/19/23 2102)   acetaminophen     amiodarone 30 mg/hr (05/20/23 0656)   ampicillin-sulbactam (UNASYN) IV 3 g (05/20/23 0801)   PRN Meds: docusate sodium, fentaNYL (SUBLIMAZE) injection, HYDROcodone-acetaminophen, ipratropium-albuterol, polyethylene glycol   Vital Signs    Vitals:   05/20/23 0600 05/20/23 0601 05/20/23 0700 05/20/23 0758  BP: 112/72  103/74   Pulse: 91  (!) 44   Resp: (!) 22 20 19    Temp:      TempSrc:      SpO2: 98% 96% 99% 93%  Weight:      Height:        Intake/Output Summary (Last 24 hours) at 05/20/2023 0910 Last data filed at 05/20/2023 0656 Gross per 24 hour  Intake 1305.71 ml  Output 3150 ml  Net -1844.29 ml   Filed Weights   05/18/23 1853 05/19/23 0701 05/20/23 0500  Weight: 94.5 kg 95.4 kg 94.5 kg    Physical Exam   GEN: Well nourished, well developed, in  no acute distress. Somewhat restless, jerking mvmts. HEENT: Grossly normal.  Neck: Supple, no JVD, carotid bruits, or masses. Cardiac: RRR, tachy, no murmurs, rubs, or gallops. No clubbing, cyanosis, edema.  Radials 2+, DP/PT 2+ and equal bilaterally.  Respiratory:  Respirations regular and unlabored, coarse breath sounds bilat. GI: Soft, nontender, nondistended, BS + x 4. MS: no deformity or atrophy. Skin: warm and dry, no rash. Neuro:  Strength and sensation are intact. Psych: AAOx3.  Normal affect.  Labs    Chemistry Recent Labs  Lab 05/18/23 1627 05/18/23 2053 05/19/23 0444 05/20/23 0535  NA 139 136 141 137  K 5.7* 5.1 4.7 3.5  CL 112* 111 113* 105  CO2 10* 18* 22 23  GLUCOSE 160* 143* 101* 108*  BUN 37* 36* 34* 26*  CREATININE 2.01* 1.69* 1.65* 1.57*  CALCIUM 9.7 8.3* 8.5* 8.4*  PROT 7.5  --   --   --   ALBUMIN 4.3  --   --   --   AST 43*  --   --   --   ALT 20  --   --   --   ALKPHOS 103  --   --   --   BILITOT  1.0  --   --   --   GFRNONAA 35* 43* 44* 47*  ANIONGAP 17* 7 6 9      Hematology Recent Labs  Lab 05/18/23 1627 05/19/23 0444 05/20/23 0535  WBC 18.2* 14.6* 13.1*  RBC 5.13 4.39 4.09*  HGB 15.5 13.2 12.3*  HCT 48.3 41.4 38.3*  MCV 94.2 94.3 93.6  MCH 30.2 30.1 30.1  MCHC 32.1 31.9 32.1  RDW 14.5 14.8 14.8  PLT 201 179 113*    Cardiac Enzymes  Recent Labs  Lab 05/18/23 1627 05/18/23 1820 05/18/23 2247 05/19/23 0211  TROPONINIHS 21* 40* 101* 116*      BNP    Component Value Date/Time   BNP 513.5 (H) 05/18/2023 1627   Lipids  Lab Results  Component Value Date   CHOL 146 01/11/2023   HDL 52 01/11/2023   LDLCALC 76 01/11/2023   TRIG 120 05/19/2023   CHOLHDL 2.9 03/29/2022    HbA1c  Lab Results  Component Value Date   HGBA1C 6.1 (H) 01/11/2023    Radiology    DG Chest Port 1 View  Result Date: 05/20/2023 CLINICAL DATA:  Acute respiratory failure and hypoxia. EXAM: PORTABLE CHEST 1 VIEW COMPARISON:  05/19/2023 FINDINGS:  Status post median sternotomy and CABG procedure. Fracture of the proximal 3 sternotomy wires are again noted. Right IJ catheter is noted with tip at the superior cavoatrial junction. Cardiomediastinal contours are stable. Interval decrease in lung volumes with increased opacities in both lower lobes. No signs of interstitial edema or pleural effusion. IMPRESSION: Interval decrease in lung volumes with worsening aeration to both lower lobes. Electronically Signed   By: Signa Kell M.D.   On: 05/20/2023 07:21   DG Chest Port 1 View  Result Date: 05/19/2023 CLINICAL DATA:  Acute respiratory failure with hypoxia. Status post cardiac arrest. EXAM: PORTABLE CHEST 1 VIEW COMPARISON:  05/18/2023 FINDINGS: There is a right IJ catheter with tip in the projection of the cavoatrial junction. Interval extubation. Signs of previous median sternotomy and CABG procedure. As noted previously there is fracture of the proximal 3 sternotomy wires. Cardiomediastinal contours are unremarkable. Trace interstitial edema with thickening of the peripheral septal lines in the left lung. No pleural effusion or airspace consolidation. IMPRESSION: 1. Interval extubation. 2. Trace interstitial edema. Electronically Signed   By: Signa Kell M.D.   On: 05/19/2023 12:30   DG Chest Port 1 View  Result Date: 05/18/2023 CLINICAL DATA:  Central line EXAM: PORTABLE CHEST 1 VIEW COMPARISON:  05/18/2023 FINDINGS: Endotracheal tube tip is about 3 cm superior to carina. Esophageal tube tip below the diaphragm but incompletely visualized. Post sternotomy changes. Right IJ central venous catheter tip over the SVC. No visible pneumothorax. Stable cardiomediastinal silhouette. Patchy atelectasis left base. IMPRESSION: Right IJ central venous catheter tip over the SVC. No visible pneumothorax. Electronically Signed   By: Jasmine Pang M.D.   On: 05/18/2023 22:45   CT Angio Chest/Abd/Pel for Dissection W and/or Wo Contrast  Result Date:  05/18/2023 CLINICAL DATA:  Post cardiac arrest EXAM: CT ANGIOGRAPHY CHEST, ABDOMEN AND PELVIS TECHNIQUE: Non-contrast CT of the chest was initially obtained. Multidetector CT imaging through the chest, abdomen and pelvis was performed using the standard protocol during bolus administration of intravenous contrast. Multiplanar reconstructed images and MIPs were obtained and reviewed to evaluate the vascular anatomy. RADIATION DOSE REDUCTION: This exam was performed according to the departmental dose-optimization program which includes automated exposure control, adjustment of the mA and/or kV according to patient size  and/or use of iterative reconstruction technique. CONTRAST:  OMNIPAQUE IOHEXOL 350 MG/ML SOLN COMPARISON:  None Available. FINDINGS: CTA CHEST FINDINGS VASCULAR Aorta: Satisfactory opacification of the aorta. Normal contour and caliber of the thoracic aorta. No evidence of aneurysm, dissection, or other acute aortic pathology. Moderate mixed calcific atherosclerosis Cardiovascular: No evidence of pulmonary embolism on limited non-tailored examination. Cardiomegaly. Three-vessel coronary artery calcifications status post median sternotomy and CABG. No pericardial effusion. Review of the MIP images confirms the above findings. NON VASCULAR Mediastinum/Nodes: No enlarged mediastinal, hilar, or axillary lymph nodes. Thyroid gland, trachea, and esophagus demonstrate no significant findings. Lungs/Pleura: Endotracheal intubation, tip above the carina. Dependent bibasilar scarring or atelectasis. No pleural effusion or pneumothorax. Musculoskeletal: No chest wall abnormality. Numerous nondisplaced fractures of the anterior ribs (series 6, image 61). Review of the MIP images confirms the above findings. CTA ABDOMEN AND PELVIS FINDINGS VASCULAR Status post aortobiiliac stent endograft repair of abdominal aortic aneurysm. No acute findings. Moderate mixed calcific atherosclerosis. Standard branching pattern  of the abdominal aorta with solitary bilateral renal arteries. Review of the MIP images confirms the above findings. NON-VASCULAR Hepatobiliary: No solid liver abnormality is seen. No gallstones, gallbladder wall thickening, or biliary dilatation. Pancreas: Unremarkable. No pancreatic ductal dilatation or surrounding inflammatory changes. Spleen: Normal in size without significant abnormality. Adrenals/Urinary Tract: Adrenal glands are unremarkable. Numerous bilateral renal cortical cysts, more numerous on the left, without obvious solid mass or suspicious contrast enhancement. No hydronephrosis. Kidneys are otherwise normal, without renal calculi, solid lesion, or hydronephrosis. Bladder is unremarkable. Stomach/Bowel: Esophagogastric tube with tip and side port below the diaphragm. Stomach is within normal limits. Appendix appears normal. No evidence of bowel wall thickening, distention, or inflammatory changes. Sigmoid diverticulosis Lymphatic: No enlarged abdominal or pelvic lymph nodes. Reproductive: No mass or other significant abnormality. Other: No abdominal wall hernia or abnormality. No ascites. Musculoskeletal: No acute osseous findings. IMPRESSION: 1. No evidence of thoracic aortic aneurysm, dissection, or other acute aortic pathology. 2. Status post aortobiiliac stent endograft repair of the abdominal aorta without acute findings. 3. No evidence of pulmonary embolism on limited non-tailored examination. 4. Numerous nondisplaced fractures of the anterior ribs. No pneumothorax. 5. Cardiomegaly and coronary artery disease status post median sternotomy and CABG. 6. Sigmoid diverticulosis without evidence of acute diverticulitis. Aortic Atherosclerosis (ICD10-I70.0). Electronically Signed   By: Jearld Lesch M.D.   On: 05/18/2023 17:46   CT Head Wo Contrast  Result Date: 05/18/2023 CLINICAL DATA:  Cardiac arrest. EXAM: CT HEAD WITHOUT CONTRAST TECHNIQUE: Contiguous axial images were obtained from the base  of the skull through the vertex without intravenous contrast. RADIATION DOSE REDUCTION: This exam was performed according to the departmental dose-optimization program which includes automated exposure control, adjustment of the mA and/or kV according to patient size and/or use of iterative reconstruction technique. COMPARISON:  MRI brain 11/23/2022 FINDINGS: Brain: There is no evidence for acute hemorrhage, hydrocephalus, mass lesion, or abnormal extra-axial fluid collection. No definite CT evidence for acute infarction. Vascular: No hyperdense vessel or unexpected calcification. Skull: No evidence for fracture. No worrisome lytic or sclerotic lesion. Sinuses/Orbits: The visualized paranasal sinuses and mastoid air cells are clear. Visualized portions of the globes and intraorbital fat are unremarkable. Other: Gas is identified in venous anatomy of the lower head bilaterally, likely reflecting IV access. IMPRESSION: 1. No acute intracranial abnormality. 2. Gas in superficial venous anatomy of the lower head bilaterally, likely reflecting IV access. Electronically Signed   By: Kennith Center M.D.   On: 05/18/2023  17:35   DG Chest Port 1 View  Result Date: 05/18/2023 CLINICAL DATA:  Intubation EXAM: PORTABLE CHEST 1 VIEW COMPARISON:  06/03/2018 FINDINGS: Endotracheal tube with the tip 6.6 cm above the carina. Nasogastric tube with the tip projecting over the stomach. No focal consolidation. No pleural effusion or pneumothorax. Heart and mediastinal contours are unremarkable. Prior CABG. No acute osseous abnormality. IMPRESSION: 1. Endotracheal tube with the tip 6.6 cm above the carina. Electronically Signed   By: Elige Ko M.D.   On: 05/18/2023 17:11    Telemetry    Afib/flutter, 90's overnight - 120's this AM - Personally Reviewed  Cardiac Studies   2D Echocardiogram 7.5.2024  1. Left ventricular ejection fraction, by estimation, is 40 to 45%. The  left ventricle has mildly decreased function. The  left ventricle  demonstrates regional wall motion abnormalities (inferolateral wall  hypokinesis). There is moderate asymmetric left  ventricular hypertrophy of the septal segment. Left ventricular diastolic  parameters are consistent with Grade I diastolic dysfunction (impaired  relaxation).   2. Right ventricular systolic function is normal. The right ventricular  size is normal. There is normal pulmonary artery systolic pressure. The  estimated right ventricular systolic pressure is 29.0 mmHg.   3. The mitral valve is normal in structure. No evidence of mitral valve  regurgitation. No evidence of mitral stenosis.   4. Tricuspid valve regurgitation is moderate.   5. The aortic valve is normal in structure. Aortic valve regurgitation is  not visualized. No aortic stenosis is present.   6. There is borderline dilatation of the aortic root, measuring 40 mm.   7. The inferior vena cava is normal in size with greater than 50%  respiratory variability, suggesting right atrial pressure of 3 mmHg.  _____________   Patient Profile     Stuart Harvey is a 72 y.o. male with a history of CAD s/p CABG x 4 in 2008 ([facility]), chronic HFrEF/ICM, PAF on eliquis, HTN, HL, infrarenal AAA s/p open repair 08/2007), R iliac artery aneurysm s/p EVAR 07/2021, hypothyroidism, anemia, depression, and GERD, admitted 7/4 following witnessed arrest @ home.  Assessment & Plan    1.  Cardiac arrest:  pt with witnessed loss of consciousness by wife w/ immediate bystander CPR.  Per ED notes, EMS reported that pt was pulseless upon their arrival but ROSC achieved after 4 additional mins of CPR.  Did not require ACLS medications.  Afib w/ RVR upon arrival to ED.  HsTrops mildly elevated 21   40   101   116.  Echo 7/5 w/ sl improvement in EF to 40% w/ inferolateral HK.  Pt recalls some events prior to syncope - was in garage, felt hot, sob, no c/p or palpitations.  Remains in afib/flutter on tele.  No ventricular arrhythmias  noted.  EP to see Monday.  2.  Afib w/ RVR/Atrial Flutter:  pt presented in Afib @ 180.  Loaded w/ amio on 7/5.  Rates better overnight, but higher now that he is awake, 120s to 130s.  Febrile this AM as well.  Reload amio 150 mg x 1 and run gtt @ 60mg /hr.  Heparin held last night in setting of pink-tinged urine, now clear.  H/H stable.  Discussed w/ CCM.  Will resume heparin w/ plan for DCCV if necessary on Monday morning @ 7:30. Informed Consent   Shared Decision Making/Informed Consent The risks (stroke, cardiac arrhythmias rarely resulting in the need for a temporary or permanent pacemaker, skin irritation or burns and  complications associated with conscious sedation including aspiration, arrhythmia, respiratory failure and death), benefits (restoration of normal sinus rhythm) and alternatives of a direct current cardioversion were explained in detail to Mr. Rexroth and he agrees to proceed.      3.  ICM/HFrEF:  EF 40% by echo 7/5.  Received IV lasix yesterday and this AM.  Minus 1.8 L yesterday.  Does not appear to be markedly volume overloaded.  BPs remain soft - currently on midodrine and home doeses of ? blocker and entresto on hold.  4.  CAD/Demand Ischemia:   s/p prior CABG w/ 3/4 patent grafts and known multivessel dzs on cath in 07/2022.  Wife unaware of any recent bouts of c/p.  Will be able to eval further once he's extubated, more awake.  HsTrop mildly elevated 21   40   101   116.  As above, Echo w/ EF of 40%, inflat HK.  Defer ischemic eval @ this time.  EP to be involved, may still ultimately require cath to r/o progression of dzs that might have contributed to presumed arrhythmia/arrest.  Cont ASA.  Resume statin.  5.  Hypotension:  Pressures low 100's.  Home meds on hold.  Follow.  6.  HL:  resume rosuvastatin.  7.  Acute resp failure/Possible aspiration PNA:  on HFNC.  Abx per IM.  8.  AKI/CKD III:  renal fxn stable.  9.  Hyperkalemia:  resolved.  Signed, Nicolasa Ducking, NP   05/20/2023, 9:10 AM    For questions or updates, please contact   Please consult [email or web address] for contact info under Cardiology/STEMI.

## 2023-05-20 NOTE — Progress Notes (Signed)
ANTICOAGULATION CONSULT NOTE  Pharmacy Consult for heparin infusion Indication: atrial fibrillation  Patient Measurements: Height: 6' (182.9 cm) Weight: 94.5 kg (208 lb 5.4 oz) (95.6-1.1) IBW/kg (Calculated) : 77.6 Heparin Dosing Weight: 94.5 kg  Labs: Recent Labs    05/18/23 1627 05/18/23 1820 05/18/23 2053 05/18/23 2247 05/19/23 0211 05/19/23 0444 05/19/23 1741 05/20/23 0535 05/20/23 1509 05/20/23 1848  HGB 15.5  --   --   --   --  13.2  --  12.3*  --   --   HCT 48.3  --   --   --   --  41.4  --  38.3*  --   --   PLT 201  --   --   --   --  179  --  113*  --   --   APTT  --   --   --   --   --   --  140*  --   --  70*  LABPROT 49.6*  --  16.5*  --   --   --   --   --   --   --   INR 5.4*  --  1.3*  --   --   --   --   --   --   --   HEPARINUNFRC  --   --   --   --   --   --   --   --   --  0.67  CREATININE 2.01*  --  1.69*  --   --  1.65*  --  1.57* 1.48*  --   TROPONINIHS 21* 40*  --  101* 116*  --   --   --   --   --     Estimated Creatinine Clearance: 54.7 mL/min (A) (by C-G formula based on SCr of 1.48 mg/dL (H)).  Medical History: Past Medical History:  Diagnosis Date   AAA (abdominal aortic aneurysm) (HCC)    a.) s/p EVAR in 08/2007 by Dr. Earnestine Leys. b.) CTA on 06/30/2021 --> interval aneurysmal dilitation superior to stent graft with the juxtarenal aorta measuring 3.8 cm.   Anemia    Arthritis    both knees   Chronic anticoagulation    Apixaban   Chronic HFrEF (heart failure with reduced ejection fraction) (HCC)    a. 07/2022 Echo: EF 35-40%, glob HK, mild LVH, GrI DD, nl RV fxn, mildly dil RA/LA, mild MR, mild AoV sclerosis. Asc Ao 40mm.   CKD (chronic kidney disease), stage II    Coronary artery disease    a. 06/2007 CABGx4 @ [facility] (LIMA-LAD, VG-RPDA, VG-OM3,VG-D2); b. 2011 Cath: patent grafts EF 40%, c.06/05/18 Cath: Sev native dzs, LIMA-LAD patent, VG-RPDA patent, VG-OM3 mid-graft 60% and 80%; d. 07/2022 Cath: LM nl, LAD 50p, 90p/m, D1 70, RI  30, LCX 40ost/p,  100p CTO, OM3 100, RCA 100p CTO, LIMA->LAD ok, VG->D2 ok, VG->RPDA min irregs, VG->OM3 100->Med rx.   Depression    GERD (gastroesophageal reflux disease)    Hernia of abdominal cavity    History of 2019 novel coronavirus disease (COVID-19) 09/2019   Hyperlipidemia    Hypertension    Hypothyroidism    Iliac artery aneurysm, right (HCC)    a.) at the distal landing zone of the right iliac limb measuring 3 cm   Ischemic cardiomyopathy    Right middle lobe pulmonary nodule 06/30/2021   a.) measured 8 mm by CT on 06/30/2021.   S/P CABG x 4 06/25/2007  a.) LIMA-LAD, SVG-RPDA, SVG-OM3, SVG-D2   Sigmoid diverticulosis    Ventricular tachycardia (HCC) 05/2018   a.) found unresponsive by EMS; VT noted --> defibrillation achieved ROSC. Briefly intubated. Episode felt to be secondary to heat stoke.   Assessment: 72 y.o. male with a history of CAD status post CABG, HFrEF, AAA status post surgical repair in 2008, right iliac artery aneurysm status post endovascular repair in 2022, atrial fibrillation on Eliquis, CKD, hypertension, hyperlipidemia, hypothyroidism, anemia, GERD, and depression who presents with altered mental status after out of hospital cardiac arrest w/ ROSC after ~ 4 minutes.   Heparin was stopped 7/5 PM due to pink-tinged urine. Last aPTT was 140 with heparin at 1100 units/hr.   Goal of Therapy:  Heparin level 0.3-0.7 units/ml aPTT 66 - 102 seconds Monitor platelets by anticoagulation protocol: Yes   0706 1848 aPTT 70 ; HL 0.67  Plan: aPTT therapeutic x 1. Recheck heparin level tomorrow AM to fully assess correlation. --Continue heparin 900 units/hr --Will check aPTT in 8 hours --Will dose via aPTT until correlation with heparin level confirmed --Daily CBC and heparin level  Elliot Gurney, PharmD, BCPS Clinical Pharmacist  05/20/2023 7:13 PM

## 2023-05-20 NOTE — TOC Initial Note (Signed)
Transition of Care [facility]) - Initial/Assessment Note    Patient Details  Name: Stuart Harvey MRN: [id number] Date of Birth: 1951/11/06  Transition of Care [facility]) CM/SW Contact:    Liliana Cline, LCSW Phone Number: 05/20/2023, 3:30 PM  Clinical Narrative:                 Patient deferred to wife to discuss HHPT/OT recs and RW rec.  Spoke to wife [name] by phone. Patient is from home with [name]. Patient drives at baseline. Confirmed address listed in chart. PCP is [facility]. Pharmacy is [facility]. No DME or HH history. Patient and wife agreeable to [facility] and RW. No agency preferences. HH referral made to [place] with [facility]. Checked with MD and patient is not expected to DC, will need to order RW within 48 hours of DC per [facility] policy - updated TOC handoff to monitor for this.   Expected Discharge Plan: Home w Home Health Services Barriers to Discharge: Continued Medical Work up   Patient Goals and CMS Choice Patient states their goals for this hospitalization and ongoing recovery are:: home with home health CMS Medicare.gov Compare Post Acute Care list provided to:: Patient Represenative (must comment) Choice offered to / list presented to : Spouse      Expected Discharge Plan and Services       Living arrangements for the past 2 months: Single Family Home                           HH Arranged: PT, OT HH Agency: [facility] Date [facility] Agency Contacted: 05/20/23   Representative spoke with at [facility] Agency: Coralee North  Prior Living Arrangements/Services Living arrangements for the past 2 months: Single Family Home Lives with:: Spouse Patient language and need for interpreter reviewed:: Yes Do you feel safe going back to the place where you live?: Yes      Need for Family Participation in Patient Care: Yes (Comment) Care giver support system in place?: Yes (comment)   Criminal Activity/Legal Involvement Pertinent to Current Situation/Hospitalization: No - Comment as  needed  Activities of Daily Living Home Assistive Devices/Equipment: None ADL Screening (condition at time of admission) Patient's cognitive ability adequate to safely complete daily activities?: Yes Is the patient deaf or have difficulty hearing?: No Does the patient have difficulty seeing, even when wearing glasses/contacts?: Yes Does the patient have difficulty concentrating, remembering, or making decisions?: No Patient able to express need for assistance with ADLs?: Yes Does the patient have difficulty dressing or bathing?: No Independently performs ADLs?: Yes (appropriate for developmental age) Does the patient have difficulty walking or climbing stairs?: No Weakness of Legs: None Weakness of Arms/Hands: None  Permission Sought/Granted Permission sought to share information with : Facility Industrial/product designer granted to share information with : Yes, Verbal Permission Granted (by spouse)     Permission granted to share info w AGENCY: HH and DME agencies        Emotional Assessment       Orientation: : Oriented to Self, Oriented to Place, Oriented to  Time, Oriented to Situation Alcohol / Substance Use: Not Applicable Psych Involvement: No (comment)  Admission diagnosis:  Cardiac arrest [facility] [I46.9] Patient Active Problem List   Diagnosis Date Noted   Acute respiratory failure (HCC) 05/19/2023   Altered mental status 05/19/2023   Hypotension 05/19/2023   Typical atrial flutter (HCC) 05/19/2023   Cardiac arrest (HCC) 05/18/2023  Heart failure with reduced ejection fraction (HCC)    Hx of CABG    Adenomatous polyp of rectum    BPH (benign prostatic hyperplasia) 01/05/2021   History of MI (myocardial infarction) 12/27/2019   Senile purpura (HCC) 08/20/2018   Atrial fibrillation (HCC) 06/14/2018   Elevated PSA 07/10/2017   Varicose veins of both lower extremities with inflammation 06/13/2017   Counseling regarding advanced directives and goals of care  01/02/2017   CKD (chronic kidney disease) stage 3, GFR 30-59 ml/min (HCC) 12/18/2015   IFG (impaired fasting glucose) 12/18/2015   Hypothyroid 12/18/2015   Renal mass 08/17/2015   H/O total knee replacement 08/28/2014   Arthritis of knee, degenerative 05/05/2014   Depression 08/28/2012   Coronary artery disease    Benign hypertension with CKD (chronic kidney disease) stage III (HCC)    Hyperlipidemia    AAA (abdominal aortic aneurysm) (HCC)    PCP:  Marjie Skiff, NP Pharmacy:   [facility] [address] [place] - [address] [place] [address] Phone: [phone] Fax: [phone]  [facility] [phone] [place], [place] - [address] [place] [address] Phone: [phone] Fax: [phone]  [facility] - [place], [place] - [address] [place] [address] Phone: [phone] Fax: [phone]     Social Determinants of Health (SDOH) Social History: SDOH Screenings   Food Insecurity: No Food Insecurity (05/19/2023)  Housing: Low Risk  (05/19/2023)  Transportation Needs: No Transportation Needs (05/19/2023)  Utilities: Not At Risk (05/19/2023)  Alcohol Screen: Low Risk  (08/09/2022)  Depression (PHQ2-9): Low Risk  (02/28/2023)  Financial Resource Strain: Low Risk  (08/09/2022)  Physical Activity: Insufficiently Active (08/09/2022)  Social Connections: Socially Integrated (08/09/2022)  Stress: No Stress Concern Present (08/09/2022)  Tobacco Use: High Risk (05/19/2023)   SDOH Interventions:     Readmission Risk Interventions    05/20/2023    3:29 PM  Readmission Risk Prevention Plan  Transportation Screening Complete  PCP or Specialist Appt within 5-7 Days Complete  Home Care Screening Complete  Medication Review (RN CM) Complete

## 2023-05-20 NOTE — Consult Note (Signed)
PHARMACY CONSULT NOTE  Pharmacy Consult for Electrolyte Monitoring and Replacement   Recent Labs: Potassium (mmol/L)  Date Value  05/20/2023 3.5  02/20/2015 4.5   Magnesium (mg/dL)  Date Value  16/08/9603 2.0  12/06/2012 1.8   Calcium (mg/dL)  Date Value  54/07/8118 8.4 (L)   Calcium, Total (mg/dL)  Date Value  14/78/2956 8.8 (L)   Albumin (g/dL)  Date Value  21/30/8657 4.3  10/11/2022 4.1  12/13/2014 3.9   Phosphorus (mg/dL)  Date Value  84/69/6295 4.4  12/06/2012 3.3   Sodium (mmol/L)  Date Value  05/20/2023 137  10/11/2022 141  02/20/2015 135     Assessment: 72 y.o. male with a history of CAD status post CABG, HFrEF, AAA status post surgical repair in 2008, right iliac artery aneurysm status post endovascular repair in 2022, atrial fibrillation on Eliquis, CKD, hypertension, hyperlipidemia, hypothyroidism, anemia, GERD, and depression who presents with altered mental status after out of hospital cardiac arrest w/ ROSC after ~ 4 minutes. Pharmacy has been consulted to monitor and replace electrolytes while in ICU.  On amio gtt. On 7/5 lasix IV x 1  Goal of Therapy:  Potassium  4.0 - 5.1 mmol/L Magnesium  2.0 - 2.4 mg/dL All other electrolytes WNL  Plan:  Kcl 40 mEq x 1 PO  Recheck all electrolytes with AM labs  Ronnald Ramp, PharmD, BCPS Clinical Pharmacist   05/20/2023 7:19 AM

## 2023-05-20 NOTE — Progress Notes (Signed)
ANTICOAGULATION CONSULT NOTE  Pharmacy Consult for heparin infusion Indication: atrial fibrillation  Patient Measurements: Height: 6' (182.9 cm) Weight: 94.5 kg (208 lb 5.4 oz) (95.6-1.1) IBW/kg (Calculated) : 77.6 Heparin Dosing Weight: 94.5 kg  Labs: Recent Labs    05/18/23 1627 05/18/23 1820 05/18/23 2053 05/18/23 2247 05/19/23 0211 05/19/23 0444 05/19/23 1741 05/20/23 0535  HGB 15.5  --   --   --   --  13.2  --  12.3*  HCT 48.3  --   --   --   --  41.4  --  38.3*  PLT 201  --   --   --   --  179  --  113*  APTT  --   --   --   --   --   --  140*  --   LABPROT 49.6*  --  16.5*  --   --   --   --   --   INR 5.4*  --  1.3*  --   --   --   --   --   CREATININE 2.01*  --  1.69*  --   --  1.65*  --  1.57*  TROPONINIHS 21* 40*  --  101* 116*  --   --   --     Estimated Creatinine Clearance: 51.5 mL/min (A) (by C-G formula based on SCr of 1.57 mg/dL (H)).  Medical History: Past Medical History:  Diagnosis Date   AAA (abdominal aortic aneurysm) (HCC)    a.) s/p EVAR in 08/2007 by Dr. Earnestine Leys. b.) CTA on 06/30/2021 --> interval aneurysmal dilitation superior to stent graft with the juxtarenal aorta measuring 3.8 cm.   Anemia    Arthritis    both knees   Chronic anticoagulation    Apixaban   Chronic HFrEF (heart failure with reduced ejection fraction) (HCC)    a. 07/2022 Echo: EF 35-40%, glob HK, mild LVH, GrI DD, nl RV fxn, mildly dil RA/LA, mild MR, mild AoV sclerosis. Asc Ao 40mm.   CKD (chronic kidney disease), stage II    Coronary artery disease    a. 06/2007 CABGx4 @ [facility] (LIMA-LAD, VG-RPDA, VG-OM3,VG-D2); b. 2011 Cath: patent grafts EF 40%, c.06/05/18 Cath: Sev native dzs, LIMA-LAD patent, VG-RPDA patent, VG-OM3 mid-graft 60% and 80%; d. 07/2022 Cath: LM nl, LAD 50p, 90p/m, D1 70, RI  30, LCX 40ost/p, 100p CTO, OM3 100, RCA 100p CTO, LIMA->LAD ok, VG->D2 ok, VG->RPDA min irregs, VG->OM3 100->Med rx.   Depression    GERD (gastroesophageal reflux disease)    Hernia of  abdominal cavity    History of 2019 novel coronavirus disease (COVID-19) 09/2019   Hyperlipidemia    Hypertension    Hypothyroidism    Iliac artery aneurysm, right (HCC)    a.) at the distal landing zone of the right iliac limb measuring 3 cm   Ischemic cardiomyopathy    Right middle lobe pulmonary nodule 06/30/2021   a.) measured 8 mm by CT on 06/30/2021.   S/P CABG x 4 06/25/2007   a.) LIMA-LAD, SVG-RPDA, SVG-OM3, SVG-D2   Sigmoid diverticulosis    Ventricular tachycardia (HCC) 05/2018   a.) found unresponsive by EMS; VT noted --> defibrillation achieved ROSC. Briefly intubated. Episode felt to be secondary to heat stoke.   Assessment: 72 y.o. male with a history of CAD status post CABG, HFrEF, AAA status post surgical repair in 2008, right iliac artery aneurysm status post endovascular repair in 2022, atrial fibrillation on Eliquis, CKD, hypertension, hyperlipidemia,  hypothyroidism, anemia, GERD, and depression who presents with altered mental status after out of hospital cardiac arrest w/ ROSC after ~ 4 minutes.   Heparin was stopped 7/5 PM due to pink-tinged urine. Last aPTT was 140 with heparin at 1100 units/hr.   Goal of Therapy:  Heparin level 0.3-0.7 units/ml aPTT 66 - 102 seconds Monitor platelets by anticoagulation protocol: Yes   Plan:  --Re-start heparin infusion at 900 units/hr, no bolus given concerns for hematuria --Will check aPTT / heparin level 8 hours after re-initiation of infusion --Daily CBC per protocol while on IV heparin  Tressie Ellis 05/20/2023 9:24 AM

## 2023-05-20 NOTE — Progress Notes (Signed)
NAME:  Stuart Harvey, MRN:  [id number], DOB:  [birth year], LOS: 2 ADMISSION DATE:  05/18/2023, CONSULTATION DATE: 05/18/2023  History of Present Illness:  Patient is a 72 year old male brought in by EMS following cardiac arrest at home.   Patient was intubated and sedated at the time of our evaluation. History was obtained through collateral from his wife and ED providers. He was in his usual state of health up until earlier today. His wife found him sitting in their garage and he complained of dizziness. He then lost consciousness and fell on the floor. She activated EMS and initiated bystander CPR. On EMS arrival and continuation of ACLS, ROSC was achieved.   On arrival to the ED, the patient was intubated and was noted to be hypotensive post intubation. He was started on vasopressors as well as propofol and fentnayl for analgosedation. Imaging included head, chest, abdomen, and pelvis CT's did not show any acute findings aside from rib fractures.   PCCM consulted for admission to the ICU.  Pertinent  Medical History   -CAD (s/p 4 vessel CABG in 2008) -history of ventricular tachycardia in 2019 -CKD stage III a -HFrEF -Infrarenal AAA (s/p open repair 2008 and endovascular repair in 2022) -afib on Eliquis -HTN -hypothyroidism   RHC/LHC 07/2022   1.  Significant three-vessel coronary artery disease with patent grafts including LIMA to LAD, SVG to diagonal and SVG to right PDA.  Occluded SVG to OM which is new compared to most recent cardiac catheterization.  However, this is not unexpected given that the graft was diffusely diseased. 2.  Left ventricular angiography was not performed.  EF was moderately reduced by echo. 3.  Right heart catheterization showed normal right and left-sided filling pressures, normal pulmonary pressures and normal cardiac outpu  Significant Hospital Events: Including procedures, antibiotic start and stop dates in addition to other pertinent events   05/18/23:  cardiac arrest, intubated, admit to the ICU  05/19/23: Pt successfully extubated to HHFNC @80 % and 60L  05/20/23: Pt remains on HHFNC @80 % and 50L. Will diurese again today 40 mg iv x1 dose   Interim History / Subjective:  Pt with worsening anxiety and now developing delirium.  Outpatient antianxiety medications restarted.  Also started scheduled iv tylenol for chest pain due to numerous nondisplaced rib fractures secondary to CPR.  Pt c/o mild shortness of breath    Objective   Blood pressure 103/74, pulse (!) 44, temperature 98.1 F (36.7 C), temperature source Oral, resp. rate 19, height 6' (1.829 m), weight 94.5 kg, SpO2 93 %. CVP:  [0 mmHg-10 mmHg] 4 mmHg  FiO2 (%):  [30 %-80 %] 80 % PEEP:  [5 cmH20] 5 cmH20 Pressure Support:  [5 cmH20] 5 cmH20   Intake/Output Summary (Last 24 hours) at 05/20/2023 0813 Last data filed at 05/20/2023 4098 Gross per 24 hour  Intake 1305.71 ml  Output 3150 ml  Net -1844.29 ml   Filed Weights   05/18/23 1853 05/19/23 0701 05/20/23 0500  Weight: 94.5 kg 95.4 kg 94.5 kg    Examination: General: Acutely-ill appearing male, mild anxiety present, NAD on HHFNC  HENT: Supple, no JVD  Lungs: Course with expiratory wheezes throughout, even, non labored, pleuritic chest pain  Cardiovascular: Atrial flutter, no r/g, 2+ radial/1+ distal pulses, no edema   Abdomen: +BS x4, obese, soft, non tender  Extremities: Normal bulk and tone, no edema  Neuro: Awake and intermittent confusion, following commands, PERRLA  GU: Indwelling foley catheter draining yellow urine  Resolved Hospital Problem list     Assessment & Plan:  #Acute metabolic encephalopathy  #Anxiety  - Correct metabolic derangements  - Avoid sedating medication when able  - Resume outpatient bupropion and citalopram  - Maintain sleep/wake cycle   #Cardiac arrest following syncopal event suspect secondary to an arrhythmia  #Atrial fibrillation w rvr/flutter  #CAD s/p CABG  #HFrEF #Elevated  troponin's suspect demand ischemia  Hx: Ventricular fibrillation and AAA Echo 05/19/23: EF 40 to 45%; grade I diastolic dysfunction; tricuspid valve regurgitation - Continuous telemetry monitoring  - Cardiology consulted appreciate input: recommending continuing amiodarone gtt for now at increased rate at 60 mg/hr and amiodarone bolus x1 dose; if pt does not convert to nsr will consider cardioversion early next week  - Heparin gtt discontinued due to concern for hematuria 05/19/23; per Cardiology will restart heparin gtt in preparation for possible cardioversion early next week  - Will administer 40 mg iv lasix x1 dose  - Hold outpatient apixaban for now; continue outpatient aspirin - Holding GDMT due to hypotension  - Continue midodrine   #Acute hypoxic respiratory failure secondary to possible aspiration and atelectasis #Pleuritic chest pain   - Supplemental O2 for dyspnea and/or hypoxia  - Maintain O2 sats 92% or higher  - Aggressive pulmonary hygiene - Prn bronchodilator therapy  - OOB to chair as tolerated - Scheduled iv tylenol and prn norco for pain management    #AKI superimposed on CKD stage IIIa - Trend BMP  - Replace electrolytes as indicated  - Monitor UOP - Avoid nephrotoxic medications   #Possible aspiration pneumonia  - Trend WBC and monitor fever curve  - Trend PCT  - Continue empiric unasyn stop date 07/6  #Hypothyroidism  - Continue outpatient synthroid   #Endo  - Hemoglobin A1c pending  - CBG's ac/hs - SSI   Best Practice (right click and "Reselect all SmartList Selections" daily)   Diet/type: full liquids  DVT prophylaxis: systemic heparin GI prophylaxis: PPI Lines: N/A Foley:  Yes, and it is still needed Code Status:  full code Last date of multidisciplinary goals of care discussion [05/20/23]  07/6: Update pt and pts spouse at bedside regarding current plan of care and all questions answered Labs   CBC: Recent Labs  Lab 05/18/23 1627  05/19/23 0444 05/20/23 0535  WBC 18.2* 14.6* 13.1*  NEUTROABS 5.9  --   --   HGB 15.5 13.2 12.3*  HCT 48.3 41.4 38.3*  MCV 94.2 94.3 93.6  PLT 201 179 113*    Basic Metabolic Panel: Recent Labs  Lab 05/18/23 1627 05/18/23 2053 05/18/23 2247 05/19/23 0444 05/20/23 0535  NA 139 136  --  141 137  K 5.7* 5.1  --  4.7 3.5  CL 112* 111  --  113* 105  CO2 10* 18*  --  22 23  GLUCOSE 160* 143*  --  101* 108*  BUN 37* 36*  --  34* 26*  CREATININE 2.01* 1.69*  --  1.65* 1.57*  CALCIUM 9.7 8.3*  --  8.5* 8.4*  MG  --   --  2.4 2.4 2.0  PHOS  --   --  4.1 4.4  --    GFR: Estimated Creatinine Clearance: 51.5 mL/min (A) (by C-G formula based on SCr of 1.57 mg/dL (H)). Recent Labs  Lab 05/18/23 1627 05/18/23 1820 05/19/23 0444 05/19/23 1759 05/20/23 0535  PROCALCITON  --   --   --  <0.10  --   WBC 18.2*  --  14.6*  --  13.1*  LATICACIDVEN >9.0* 1.9  --   --   --     Liver Function Tests: Recent Labs  Lab 05/18/23 1627  AST 43*  ALT 20  ALKPHOS 103  BILITOT 1.0  PROT 7.5  ALBUMIN 4.3   No results for input(s): "LIPASE", "AMYLASE" in the last 168 hours. No results for input(s): "AMMONIA" in the last 168 hours.  ABG    Component Value Date/Time   PHART 7.32 (L) 05/18/2023 1730   PCO2ART 33 05/18/2023 1730   PO2ART 130 (H) 05/18/2023 1730   HCO3 19.2 (L) 05/18/2023 2053   ACIDBASEDEF 7.0 (H) 05/18/2023 2053   O2SAT 93 05/18/2023 2053     Coagulation Profile: Recent Labs  Lab 05/18/23 1627 05/18/23 2053  INR 5.4* 1.3*    Cardiac Enzymes: No results for input(s): "CKTOTAL", "CKMB", "CKMBINDEX", "TROPONINI" in the last 168 hours.  HbA1C: HB A1C (BAYER DCA - WAIVED)  Date/Time Value Ref Range Status  12/18/2015 08:27 AM 6.0 <7.0 % Final    Comment:                                          Diabetic Adult            <7.0                                       Healthy Adult        4.3 - 5.7                                                            (DCCT/NGSP) American Diabetes Association's Summary of Glycemic Recommendations for Adults with Diabetes: Hemoglobin A1c <7.0%. More stringent glycemic goals (A1c <6.0%) may further reduce complications at the cost of increased risk of hypoglycemia.    Hgb A1c MFr Bld  Date/Time Value Ref Range Status  01/11/2023 10:39 AM 6.1 (H) 4.8 - 5.6 % Final    Comment:             Prediabetes: 5.7 - 6.4          Diabetes: >6.4          Glycemic control for adults with diabetes: <7.0   01/05/2021 01:35 PM 5.7 (H) 4.8 - 5.6 % Final    Comment:             Prediabetes: 5.7 - 6.4          Diabetes: >6.4          Glycemic control for adults with diabetes: <7.0     CBG: Recent Labs  Lab 05/19/23 1550 05/19/23 2000 05/19/23 2329 05/20/23 0302 05/20/23 0741  GLUCAP 129* 122* 127* 108* 123*    Review of Systems:   Gen: anxiety, fever, chills, weight change, fatigue, night sweats HEENT: Denies blurred vision, double vision, hearing loss, tinnitus, sinus congestion, rhinorrhea, sore throat, neck stiffness, dysphagia PULM: mild shortness of breath, cough, sputum production, hemoptysis, wheezing CV: pleuritic chest pain, edema, orthopnea, paroxysmal nocturnal dyspnea, palpitations GI: Denies abdominal pain, nausea, vomiting, diarrhea, hematochezia, melena, constipation, change in  bowel habits GU: Denies dysuria, hematuria, polyuria, oliguria, urethral discharge Endocrine: Denies hot or cold intolerance, polyuria, polyphagia or appetite change Derm: Denies rash, dry skin, scaling or peeling skin change Heme: Denies easy bruising, bleeding, bleeding gums Neuro: Denies headache, numbness, weakness, slurred speech, loss of memory or consciousness   Past Medical History:  He,  has a past medical history of AAA (abdominal aortic aneurysm) (HCC), Anemia, Arthritis, Chronic anticoagulation, Chronic HFrEF (heart failure with reduced ejection fraction) (HCC), CKD (chronic kidney disease), stage II,  Coronary artery disease, Depression, GERD (gastroesophageal reflux disease), Hernia of abdominal cavity, History of 2019 novel coronavirus disease (COVID-19) (09/2019), Hyperlipidemia, Hypertension, Hypothyroidism, Iliac artery aneurysm, right (HCC), Ischemic cardiomyopathy, Right middle lobe pulmonary nodule (06/30/2021), S/P CABG x 4 (06/25/2007), Sigmoid diverticulosis, and Ventricular tachycardia (HCC) (05/2018).   Surgical History:   Past Surgical History:  Procedure Laterality Date   ABDOMINAL AORTIC ANEURYSM REPAIR N/A 08/29/2007   Procedure: EVAR; Location: [facility]; Surgeon: Brynda Greathouse, MD   ABDOMINAL AORTIC ANEURYSM REPAIR N/A 12/05/2012   Procedure: Abdominal aortic aneurysm of approximately 6 cm in maximal diameter status post previous endovascular repair with type I and III endoleaks; Location: [facility]; Surgeon: Festus Barren, MD   CARDIAC CATHETERIZATION Left 06/28/2010   3v CAD with patent CABG grafts; LVEF 40%; stable aortic stent graft; Location: [facility]; Surgeon: Arnoldo Hooker, MD   CARDIAC CATHETERIZATION Left 06/18/2007   3v CAD; LVEF 50%; refer to CVTS for CABG; Location: [facility]; Surgeon: [name], MD   COLONOSCOPY WITH PROPOFOL N/A 11/17/2021   Procedure: COLONOSCOPY WITH PROPOFOL;  Surgeon: Toney Reil, MD;  Location: [facility] ENDOSCOPY;  Service: Gastroenterology;  Laterality: N/A;   CORONARY ARTERY BYPASS GRAFT N/A 06/25/2007   Procedure: 4v CABG (LIMA-LAD, SVG-RPDA, SVG-OM3, SVG-D2); Location: [facility]; Surgeon: Marshell Garfinkel, MD   CORONARY/GRAFT ANGIOGRAPHY N/A 06/05/2018   Procedure: Rosalie Doctor ANGIOGRAPHY;  Surgeon: Yvonne Kendall, MD;  Location: [facility] INVASIVE CV LAB;  Service: Cardiovascular;  Laterality: N/A;   ENDOVASCULAR REPAIR/STENT GRAFT N/A 08/04/2021   Procedure: ENDOVASCULAR REPAIR/STENT GRAFT;  Surgeon: Annice Needy, MD;  Location: [facility] INVASIVE CV LAB;  Service: Cardiovascular;  Laterality: N/A;   EXPLORATORY LAPAROTOMY  1997   HERNIA REPAIR     KNEE  ARTHROPLASTY Left 09/19/2016   Procedure: COMPUTER ASSISTED TOTAL KNEE ARTHROPLASTY;  Surgeon: Donato [name], MD;  Location: [facility] ORS;  Service: Orthopedics;  Laterality: Left;   RIGHT/LEFT HEART CATH AND CORONARY ANGIOGRAPHY N/A 08/05/2022   Procedure: RIGHT/LEFT HEART CATH AND CORONARY ANGIOGRAPHY;  Surgeon: Iran Ouch, MD;  Location: [facility] INVASIVE CV LAB;  Service: Cardiovascular;  Laterality: N/A;   TOTAL KNEE ARTHROPLASTY Right 09/2016     Social History:   reports that he quit smoking about 17 years ago. His smoking use included cigarettes. He has a 35.00 pack-year smoking history. He has been exposed to tobacco smoke. His smokeless tobacco use includes chew. He reports that he does not drink alcohol and does not use drugs.   Family History:  His family history includes Depression in his maternal grandfather and maternal grandmother; Heart attack in his father, mother, and another family member; Heart disease in his mother and another family member; Hypertension in his father and mother; Kidney disease in his brother. There is no history of Prostate cancer, Kidney cancer, Stomach cancer, or Colon cancer.   Allergies Allergies  Allergen Reactions   Benzodiazepines     Paradoxical agitation and delirium   Codeine Nausea Only   Tetracycline Rash  Home Medications  Prior to Admission medications   Medication Sig Start Date End Date Taking? Authorizing Provider  apixaban (ELIQUIS) 5 MG TABS tablet Take 1 tablet (5 mg total) by mouth 2 (two) times daily. 11/16/22  Yes Dunn, Raymon Mutton, PA-C  buPROPion (WELLBUTRIN SR) 200 MG 12 hr tablet Take 1 tablet (200 mg total) by mouth 2 (two) times daily. 11/16/22  Yes Cannady, Corrie Dandy T, NP  Cholecalciferol 50 MCG (2000 UT) CAPS Take 1 capsule by mouth daily.    Yes [provider]  citalopram (CELEXA) 40 MG tablet Take 1 tablet (40 mg total) by mouth daily. 04/26/23  Yes Cannady, [name], NP  ezetimibe (ZETIA) 10 MG tablet Take 1 tablet  (10 mg total) by mouth daily. 03/28/23  Yes Cannady, Corrie Dandy T, NP  levothyroxine (SYNTHROID) 75 MCG tablet Take 1 tablet (75 mcg total) by mouth daily. 02/21/23  Yes Cannady, [name], NP  metoprolol succinate (TOPROL XL) 25 MG 24 hr tablet Take 0.5 tablets (12.5 mg total) by mouth daily. 11/10/22  Yes Dunn, Raymon Mutton, PA-C  omeprazole (PRILOSEC) 20 MG capsule Take 1 capsule (20 mg total) by mouth daily. 02/21/23  Yes Cannady, [name], NP  rosuvastatin (CRESTOR) 40 MG tablet Take 1 tablet (40 mg total) by mouth daily. 04/26/23  Yes Iran Ouch, MD  sacubitril-valsartan (ENTRESTO) 24-26 MG Take 1 tablet by mouth 2 (two) times daily. Patient not taking: Reported on 05/18/2023 10/14/22   Sondra Barges, PA-C     Critical care time: 40 minutes      Zada Girt, AGNP  Pulmonary/Critical Care Pager [phone] (please enter 7 digits) PCCM Consult Pager [phone] (please enter 7 digits)

## 2023-05-20 NOTE — Plan of Care (Signed)
Discussed with patient in front of family plan of care for the evening, pain management and new medications with some teach back displayed.  What is important to the patient is rest tonight.   Problem: Education: Goal: Knowledge of General Education information will improve Description: Including pain rating scale, medication(s)/side effects and non-pharmacologic comfort measures Outcome: Progressing   Problem: Health Behavior/Discharge Planning: Goal: Ability to manage health-related needs will improve Outcome: Not Progressing   Problem: Pain Managment: Goal: General experience of comfort will improve Outcome: Progressing

## 2023-05-20 NOTE — Evaluation (Signed)
Physical Therapy Evaluation Patient Details Name: TARON WEINGARTZ MRN: [id number] DOB: 15-Nov-1950 Today's Date: 05/20/2023  History of Present Illness  72 y/o male presented to ED on 05/18/23 for cardiac arrest s/p CPR with ROSC after ~4 minutes. Intubated 7/4-7/5. PMH: CAD s/p CABG, HFrEF, AAA s/p surgical repair in 2008, R iliac artery aneurysm s/p endovascular repair in 2022, Afib on Eliquis, CKD, HTN, anemia, depression.  Clinical Impression  Patient admitted with the above. PTA, patient lives with wife and reports independence with no AD. Patient presents with decreased activity tolerance, weakness, and impaired balance. Requiring 50L HHFNC 80% FiO2. Completed bed mobility with supervision and transfers OOB with min guard and RW. +2 for line management. SpO2 maintained >90% throughout and improved to upper 90s with pursed lip breathing while seated in chair. Patient will benefit from skilled PT services during acute stay to address listed deficits. Patient will benefit from ongoing therapy at discharge to maximize functional independence and safety.         Assistance Recommended at Discharge Intermittent Supervision/Assistance  If plan is discharge home, recommend the following:  Can travel by private vehicle  A little help with walking and/or transfers;A little help with bathing/dressing/bathroom;Assistance with cooking/housework;Assist for transportation;Help with stairs or ramp for entrance        Equipment Recommendations Rolling Calleen Alvis (2 wheels)  Recommendations for Other Services       Functional Status Assessment Patient has had a recent decline in their functional status and demonstrates the ability to make significant improvements in function in a reasonable and predictable amount of time.     Precautions / Restrictions Precautions Precautions: Fall Precaution Comments: watch O2 Restrictions Weight Bearing Restrictions: No      Mobility  Bed Mobility Overal bed  mobility: Needs Assistance Bed Mobility: Supine to Sit     Supine to sit: Supervision, HOB elevated     General bed mobility comments: Increased time/effort to perform with assist for mgt of lines/leads    Transfers Overall transfer level: Needs assistance Equipment used: Rolling Aahil Fredin (2 wheels) Transfers: Sit to/from Stand, Bed to chair/wheelchair/BSC Sit to Stand: Min guard, +2 safety/equipment, From elevated surface   Step pivot transfers: Min guard, +2 safety/equipment       General transfer comment: mild LOB with transfer to recliner but able to self correct. +2 for line mangement    Ambulation/Gait                  Stairs            Wheelchair Mobility     Tilt Bed    Modified Rankin (Stroke Patients Only)       Balance Overall balance assessment: Needs assistance Sitting-balance support: Feet supported, No upper extremity supported Sitting balance-Leahy Scale: Good     Standing balance support: Reliant on assistive device for balance, Bilateral upper extremity supported Standing balance-Leahy Scale: Fair                               Pertinent Vitals/Pain Pain Assessment Pain Assessment: No/denies pain    Home Living Family/patient expects to be discharged to:: Private residence Living Arrangements: Spouse/significant other Available Help at Discharge: Family;Available 24 hours/day Type of Home: House Home Access: Stairs to enter Entrance Stairs-Rails: None (None on main entrance) Entrance Stairs-Number of Steps: ~4-6   Home Layout: One level Home Equipment: None      Prior Function Prior Level of  Function : Independent/Modified Independent;Driving             Mobility Comments: I without AE, denies falls. ADLs Comments: Independent with all ADL/IADL management.     Hand Dominance   Dominant Hand: Right    Extremity/Trunk Assessment   Upper Extremity Assessment Upper Extremity Assessment: Defer to OT  evaluation    Lower Extremity Assessment Lower Extremity Assessment: Generalized weakness    Cervical / Trunk Assessment Cervical / Trunk Assessment: Normal  Communication   Communication: No difficulties  Cognition Arousal/Alertness: Awake/alert Behavior During Therapy: WFL for tasks assessed/performed Overall Cognitive Status: Within Functional Limits for tasks assessed                                          General Comments General comments (skin integrity, edema, etc.): Pt on 50L HFNC 80% FiO2. SpO2 remains [facility] t/o session. He is noted to be at 90% at start of session while in bed. This improves with PLB, and pt left as recieved and O2 sats from 95-96% by end of session.    Exercises     Assessment/Plan    PT Assessment Patient needs continued PT services  PT Problem List Decreased strength;Decreased activity tolerance;Decreased balance;Decreased mobility;Decreased knowledge of use of DME;Decreased safety awareness;Decreased knowledge of precautions;Cardiopulmonary status limiting activity       PT Treatment Interventions DME instruction;Gait training;Functional mobility training;Therapeutic activities;Therapeutic exercise;Balance training;Stair training;Patient/family education    PT Goals (Current goals can be found in the Care Plan section)  Acute Rehab PT Goals Patient Stated Goal: to get better PT Goal Formulation: With patient Time For Goal Achievement: 06/03/23 Potential to Achieve Goals: Good    Frequency Min 3X/week     Co-evaluation   Reason for Co-Treatment: Complexity of the patient's impairments (multi-system involvement);For patient/therapist safety;To address functional/ADL transfers PT goals addressed during session: Mobility/safety with mobility;Balance OT goals addressed during session: ADL's and self-care;Proper use of Adaptive equipment and DME       AM-PAC PT "6 Clicks" Mobility  Outcome Measure Help needed turning from  your back to your side while in a flat bed without using bedrails?: A Little Help needed moving from lying on your back to sitting on the side of a flat bed without using bedrails?: A Little Help needed moving to and from a bed to a chair (including a wheelchair)?: A Little Help needed standing up from a chair using your arms (e.g., wheelchair or bedside chair)?: A Little Help needed to walk in hospital room?: A Lot Help needed climbing 3-5 steps with a railing? : A Lot 6 Click Score: 16    End of Session Equipment Utilized During Treatment: Oxygen Activity Tolerance: Patient tolerated treatment well Patient left: in chair;with call bell/phone within reach;with family/visitor present Nurse Communication: Mobility status;Other (comment) (O2 requirement) PT Visit Diagnosis: Unsteadiness on feet (R26.81);Muscle weakness (generalized) (M62.81);Other abnormalities of gait and mobility (R26.89)    Time: 4098-1191 PT Time Calculation (min) (ACUTE ONLY): 27 min   Charges:   PT Evaluation $PT Eval Moderate Complexity: 1 Mod   PT General Charges $$ ACUTE PT VISIT: 1 Visit         Maylon Peppers, PT, DPT Physical Therapist - [facility]  [facility] [place]   Marka Treloar A Hannah Crill 05/20/2023, 1:27 PM

## 2023-05-21 DIAGNOSIS — I483 Typical atrial flutter: Secondary | ICD-10-CM | POA: Diagnosis not present

## 2023-05-21 DIAGNOSIS — I959 Hypotension, unspecified: Secondary | ICD-10-CM | POA: Diagnosis not present

## 2023-05-21 DIAGNOSIS — I469 Cardiac arrest, cause unspecified: Secondary | ICD-10-CM | POA: Diagnosis not present

## 2023-05-21 DIAGNOSIS — J9600 Acute respiratory failure, unspecified whether with hypoxia or hypercapnia: Secondary | ICD-10-CM | POA: Diagnosis not present

## 2023-05-21 LAB — CBC
HCT: 37 % — ABNORMAL LOW (ref 39.0–52.0)
Hemoglobin: 11.9 g/dL — ABNORMAL LOW (ref 13.0–17.0)
MCH: 30.1 pg (ref 26.0–34.0)
MCHC: 32.2 g/dL (ref 30.0–36.0)
MCV: 93.4 fL (ref 80.0–100.0)
Platelets: 114 10*3/uL — ABNORMAL LOW (ref 150–400)
RBC: 3.96 MIL/uL — ABNORMAL LOW (ref 4.22–5.81)
RDW: 14.6 % (ref 11.5–15.5)
WBC: 10.5 10*3/uL (ref 4.0–10.5)
nRBC: 0 % (ref 0.0–0.2)

## 2023-05-21 LAB — GLUCOSE, CAPILLARY
Glucose-Capillary: 98 mg/dL (ref 70–99)
Glucose-Capillary: 126 mg/dL — ABNORMAL HIGH (ref 70–99)
Glucose-Capillary: 57 mg/dL — ABNORMAL LOW (ref 70–99)
Glucose-Capillary: 111 mg/dL — ABNORMAL HIGH (ref 70–99)
Glucose-Capillary: 68 mg/dL — ABNORMAL LOW (ref 70–99)
Glucose-Capillary: 111 mg/dL — ABNORMAL HIGH (ref 70–99)

## 2023-05-21 LAB — BASIC METABOLIC PANEL
Anion gap: 7 (ref 5–15)
BUN: 22 mg/dL (ref 8–23)
CO2: 25 mmol/L (ref 22–32)
Calcium: 8.3 mg/dL — ABNORMAL LOW (ref 8.9–10.3)
Chloride: 104 mmol/L (ref 98–111)
Creatinine, Ser: 1.39 mg/dL — ABNORMAL HIGH (ref 0.61–1.24)
GFR, Estimated: 54 mL/min — ABNORMAL LOW (ref >60)
Glucose, Bld: 109 mg/dL — ABNORMAL HIGH (ref 70–99)
Potassium: 3.7 mmol/L (ref 3.5–5.1)
Sodium: 136 mmol/L (ref 135–145)

## 2023-05-21 LAB — HEPARIN LEVEL (UNFRACTIONATED): Heparin Unfractionated: 0.53 IU/mL (ref 0.30–0.70)

## 2023-05-21 LAB — MAGNESIUM: Magnesium: 2.1 mg/dL (ref 1.7–2.4)

## 2023-05-21 LAB — APTT: aPTT: 64 seconds — ABNORMAL HIGH (ref 24–36)

## 2023-05-21 MED ORDER — MORPHINE SULFATE (PF) 2 MG/ML IV SOLN
2.0000 mg | Freq: Once | INTRAVENOUS | Status: AC
  Administered 2023-05-21: 2 mg via INTRAVENOUS
  Filled 2023-05-21: qty 1

## 2023-05-21 MED ORDER — OXYCODONE HCL 5 MG PO TABS
5.0000 mg | ORAL_TABLET | ORAL | Status: DC | PRN
  Administered 2023-05-21 – 2023-05-24 (×10): 10 mg via ORAL
  Administered 2023-05-25: 5 mg via ORAL
  Filled 2023-05-21 (×12): qty 2

## 2023-05-21 MED ORDER — SODIUM CHLORIDE 0.9 % IV SOLN: INTRAVENOUS | Status: DC

## 2023-05-21 MED ORDER — FUROSEMIDE 10 MG/ML IJ SOLN
20.0000 mg | Freq: Every day | INTRAMUSCULAR | Status: DC
  Administered 2023-05-21: 20 mg via INTRAVENOUS
  Filled 2023-05-21: qty 2

## 2023-05-21 MED ORDER — BUPROPION HCL ER (SR) 150 MG PO TB12
150.0000 mg | ORAL_TABLET | Freq: Two times a day (BID) | ORAL | Status: DC
  Administered 2023-05-21 – 2023-05-25 (×8): 150 mg via ORAL
  Filled 2023-05-21 (×9): qty 1

## 2023-05-21 MED ORDER — TRAZODONE HCL 50 MG PO TABS
25.0000 mg | ORAL_TABLET | Freq: Every evening | ORAL | Status: DC | PRN
  Administered 2023-05-21 – 2023-05-24 (×4): 25 mg via ORAL
  Filled 2023-05-21 (×4): qty 1

## 2023-05-21 MED ORDER — POTASSIUM CHLORIDE 20 MEQ PO PACK
20.0000 meq | PACK | Freq: Once | ORAL | Status: AC
  Administered 2023-05-21: 20 meq via ORAL
  Filled 2023-05-21: qty 1

## 2023-05-21 NOTE — Progress Notes (Addendum)
[facility]  - [place] [facility]   PATIENT NAME: Stuart Harvey    MR#:  [id number]  DATE OF BIRTH:  02-20-1951  SUBJECTIVE:  wife at bedside. Patient overall feels better. No respiratory distress. Trying to wean down high flow nasal cannula. Has some chest pain from CPR. Remains in sinus rhythm. Currently on heparin drip and amiodarone drip    VITALS:  Blood pressure (!) 153/90, pulse 87, temperature 98.1 F (36.7 C), temperature source Oral, resp. rate 20, height 6' (1.829 m), weight 95.2 kg, SpO2 100 %.  PHYSICAL EXAMINATION:   GENERAL:  72 y.o.-year-old patient with no acute distress.  LUNGS: Normal breath sounds bilaterally, no wheezing CARDIOVASCULAR: S1, S2 normal. No murmur   ABDOMEN: Soft, nontender, nondistended. Bowel sounds present.  EXTREMITIES: No  edema b/l.    NEUROLOGIC: nonfocal  patient is alert and awake SKIN: No obvious rash, lesion, or ulcer.   LABORATORY PANEL:  CBC Recent Labs  Lab 05/21/23 0340  WBC 10.5  HGB 11.9*  HCT 37.0*  PLT 114*    Chemistries  Recent Labs  Lab 05/18/23 1627 05/18/23 2053 05/21/23 0340  NA 139   < > 136  K 5.7*   < > 3.7  CL 112*   < > 104  CO2 10*   < > 25  GLUCOSE 160*   < > 109*  BUN 37*   < > 22  CREATININE 2.01*   < > 1.39*  CALCIUM 9.7   < > 8.3*  MG  --    < > 2.1  AST 43*  --   --   ALT 20  --   --   ALKPHOS 103  --   --   BILITOT 1.0  --   --    < > = values in this interval not displayed.   Cardiac Enzymes No results for input(s): "TROPONINI" in the last 168 hours. RADIOLOGY:  DG Chest Port 1 View  Result Date: 05/20/2023 CLINICAL DATA:  Acute respiratory failure and hypoxia. EXAM: PORTABLE CHEST 1 VIEW COMPARISON:  05/19/2023 FINDINGS: Status post median sternotomy and CABG procedure. Fracture of the proximal 3 sternotomy wires are again noted. Right IJ catheter is noted with tip at the superior cavoatrial junction. Cardiomediastinal contours are stable. Interval decrease in  lung volumes with increased opacities in both lower lobes. No signs of interstitial edema or pleural effusion. IMPRESSION: Interval decrease in lung volumes with worsening aeration to both lower lobes. Electronically Signed   By: Signa Kell M.D.   On: 05/20/2023 07:21    Assessment and Plan  Stuart Harvey is a 72 y.o. male with a history of CAD status post CABG, HFrEF, AAA status post surgical repair in 2008, right iliac artery aneurysm status post endovascular repair in 2022, atrial fibrillation on Eliquis, CKD, hypertension, hyperlipidemia, hypothyroidism, anemia, GERD, and depression who presents with altered mental status after cardiac arrest.  I obtained history from both EMS and family.  The family members report that the patient had been working in the garage.  He called out for help.  The wife found him sitting on the stool leaning forward.  Subsequently he lost consciousness and collapsed.  He was noted to be cyanotic.  When EMS arrived the patient had no palpable pulses and CPR was initiated.  CPR was given for approximately 4 minutes when the patient had ROSC.  He did not receive any medication from EMS.   Cardiac arrest suspected secondary to  arrhythmia history of ischemic cardiomyopathy EF of 40 to 45% -- patient currently on amiodarone drip and IV heparin drip -- patient to be seen by electrophysiologist tomorrow -- followed by [facility] cardiology  Atrial fibrillation/flutter -- patient is on eliquis at home. He has been compliant with it -- currently on IV heparin drip -- possible plans for cardioversion on Monday  Acute respiratory failure post cardiac arrest acute on chronic CHF systolic -- patient came in with BNP of 500 -- he was on high flow nasal cannula oxygen now weaned to 6 L nasal cannula oxygen -- chest x-ray shows by basilar decreased aeration. -Will give-Lasix 20 mg IV daily -- monitor input output ---- clinically does not feel to be pneumonia. Patient received  few doses of Augmentin and now discontinued -- white count normal. Pro calcitonin negative  Chest pain due to CPR -- PRN pain meds  AKI on CKD-IIIA HyperKalemia -- improved  History of CAD status post CABG times for in 2008 -- continue aspirin, statin  Hypothyroidism -- continue Synthroid  Procedures: Family communication : wife at bedside Consults : [facility] cardiology CODE STATUS: full DVT Prophylaxis : heparin drip Level of care: Stepdown Status is: Inpatient Remains inpatient appropriate because: post cardiac arrest, plans for cardioversion    TOTAL TIME TAKING CARE OF THIS PATIENT: 35 minutes.  >50% time spent on counselling and coordination of care  Note: This dictation was prepared with Dragon dictation along with smaller phrase technology. Any transcriptional errors that result from this process are unintentional.  Enedina Finner M.D    [facility]   CC: Primary care physician; Marjie Skiff, NP

## 2023-05-21 NOTE — Consult Note (Signed)
PHARMACY CONSULT NOTE  Pharmacy Consult for Electrolyte Monitoring and Replacement   Recent Labs: Potassium (mmol/L)  Date Value  05/21/2023 3.7  02/20/2015 4.5   Magnesium (mg/dL)  Date Value  91/47/8295 2.1  12/06/2012 1.8   Calcium (mg/dL)  Date Value  62/13/0865 8.3 (L)   Calcium, Total (mg/dL)  Date Value  78/46/9629 8.8 (L)   Albumin (g/dL)  Date Value  52/84/1324 4.3  10/11/2022 4.1  12/13/2014 3.9   Phosphorus (mg/dL)  Date Value  40/08/2724 4.4  12/06/2012 3.3   Sodium (mmol/L)  Date Value  05/21/2023 136  10/11/2022 141  02/20/2015 135     Assessment: 72 y.o. male with a history of CAD status post CABG, HFrEF, AAA status post surgical repair in 2008, right iliac artery aneurysm status post endovascular repair in 2022, atrial fibrillation on Eliquis, CKD, hypertension, hyperlipidemia, hypothyroidism, anemia, GERD, and depression who presents with altered mental status after out of hospital cardiac arrest w/ ROSC after ~ 4 minutes. Pharmacy has been consulted to monitor and replace electrolytes while in ICU.  On amio gtt. On 7/5 lasix IV x 1 NS @ 20 ml/hr  Goal of Therapy:  Potassium  4.0 - 5.1 mmol/L Magnesium  2.0 - 2.4 mg/dL All other electrolytes WNL  Plan:  Kcl 20 mEq x 1 PO  F/u with AM labs.   Ronnald Ramp, PharmD, BCPS Clinical Pharmacist   05/21/2023 7:30 AM

## 2023-05-21 NOTE — Progress Notes (Signed)
Physical Therapy Treatment Patient Details Name: Stuart Harvey MRN: [id number] DOB: [birth year] Today's Date: 05/21/2023   History of Present Illness 72 y/o male presented to ED on 05/18/23 for cardiac arrest s/p CPR with ROSC after ~4 minutes. Intubated 7/4-7/5. PMH: CAD s/p CABG, HFrEF, AAA s/p surgical repair in 2008, R iliac artery aneurysm s/p endovascular repair in 2022, Afib on Eliquis, CKD, HTN, anemia, depression.    PT Comments  Patient supine in bed with family present and agreeable to walk. Performed supine<>sit supervision with assist for line management. Sit<>stand to w/ AD min guard, ambulated 3 steps to [facility] w/urinal to urinate. Ambulated 50 feet w/ RW in room min guard +2 for line management. O2 stats dropped to 81% on 6L, encouraged the patient to rest and PLB. Returned to chair sit<>stand w/ RW min guard. Educated on incentive spirometer, patient consistently doing 250 mL during trials. Discussed monitoring Spo2 levels on telemetry when practicing and stop if drops below 88%. Patient left on 6L of O2 in the recliner with call bell. Communicated with nursing his O2 stats.     Assistance Recommended at Discharge Intermittent Supervision/Assistance  If plan is discharge home, recommend the following:  Can travel by private vehicle    A little help with walking and/or transfers;A little help with bathing/dressing/bathroom;Assistance with cooking/housework;Assist for transportation;Help with stairs or ramp for entrance      Equipment Recommendations  Rolling walker (2 wheels)    Recommendations for Other Services       Precautions / Restrictions Precautions Precautions: Fall Precaution Comments: watch O2 Restrictions Weight Bearing Restrictions: No     Mobility  Bed Mobility Overal bed mobility: Needs Assistance Bed Mobility: Supine to Sit     Supine to sit: Supervision, HOB elevated     General bed mobility comments: Increased time/effort to perform with assist  for mgt of lines/leads    Transfers Overall transfer level: Needs assistance Equipment used: Rolling walker (2 wheels) Transfers: Sit to/from Stand Sit to Stand: Min guard, +2 safety/equipment                Ambulation/Gait Ambulation/Gait assistance: Min guard, +2 safety/equipment Gait Distance (Feet): 40 Feet Assistive device: Rolling walker (2 wheels) Gait Pattern/deviations: Step-through pattern, Narrow base of support, Trunk flexed       General Gait Details: +2 for lines and lead management. Pt over estimates his limits and needs to be told to rest and slow down.   Stairs             Wheelchair Mobility     Tilt Bed    Modified Rankin (Stroke Patients Only)       Balance Overall balance assessment: Needs assistance Sitting-balance support: Feet supported, Bilateral upper extremity supported Sitting balance-Leahy Scale: Normal     Standing balance support: Reliant on assistive device for balance, Bilateral upper extremity supported Standing balance-Leahy Scale: Good                              Cognition Arousal/Alertness: Awake/alert Behavior During Therapy: WFL for tasks assessed/performed Overall Cognitive Status: Within Functional Limits for tasks assessed                                          Exercises Other Exercises Other Exercises: Incentive spirometer education and trials.    General  Comments        Pertinent Vitals/Pain Pain Assessment Pain Assessment: PAINAD Breathing: occasional labored breathing, short period of hyperventilation Negative Vocalization: occasional moan/groan, low speech, negative/disapproving quality Facial Expression: smiling or inexpressive Body Language: tense, distressed pacing, fidgeting Consolability: no need to console PAINAD Score: 3 Pain Location: chest Pain Descriptors / Indicators: Aching, Cramping, Discomfort, Grimacing, Guarding Pain Intervention(s): Limited  activity within patient's tolerance, Monitored during session, Repositioned    Home Living                          Prior Function            PT Goals (current goals can now be found in the care plan section) Acute Rehab PT Goals PT Goal Formulation: With patient Time For Goal Achievement: 06/03/23 Potential to Achieve Goals: Good Progress towards PT goals: Progressing toward goals    Frequency    Min 1X/week      PT Plan Frequency needs to be updated    Co-evaluation              AM-PAC PT "6 Clicks" Mobility   Outcome Measure  Help needed turning from your back to your side while in a flat bed without using bedrails?: A Little Help needed moving from lying on your back to sitting on the side of a flat bed without using bedrails?: A Little Help needed moving to and from a bed to a chair (including a wheelchair)?: A Little Help needed standing up from a chair using your arms (e.g., wheelchair or bedside chair)?: A Little Help needed to walk in hospital room?: A Lot Help needed climbing 3-5 steps with a railing? : A Lot 6 Click Score: 16    End of Session Equipment Utilized During Treatment: Oxygen Activity Tolerance: Patient tolerated treatment well Patient left: in chair;with call bell/phone within reach;with family/visitor present Nurse Communication: Mobility status;Other (comment) (O2 requirement) PT Visit Diagnosis: Unsteadiness on feet (R26.81);Muscle weakness (generalized) (M62.81);Other abnormalities of gait and mobility (R26.89)     Time: 6045-4098 PT Time Calculation (min) (ACUTE ONLY): 31 min  Charges:                            Malachi Carl, SPT    Malachi Carl 05/21/2023, 1:29 PM

## 2023-05-21 NOTE — Progress Notes (Signed)
ANTICOAGULATION CONSULT NOTE  Pharmacy Consult for heparin infusion Indication: atrial fibrillation  Patient Measurements: Height: 6' (182.9 cm) Weight: 95.2 kg (209 lb 14.1 oz) IBW/kg (Calculated) : 77.6 Heparin Dosing Weight: 94.5 kg  Labs: Recent Labs    05/18/23 1627 05/18/23 1820 05/18/23 2053 05/18/23 2247 05/19/23 0211 05/19/23 0444 05/19/23 1741 05/20/23 0535 05/20/23 1509 05/20/23 1848 05/21/23 0340  HGB 15.5  --   --   --   --  13.2  --  12.3*  --   --  11.9*  HCT 48.3  --   --   --   --  41.4  --  38.3*  --   --  37.0*  PLT 201  --   --   --   --  179  --  113*  --   --  114*  APTT  --   --   --   --   --   --  140*  --   --  70* 64*  LABPROT 49.6*  --  16.5*  --   --   --   --   --   --   --   --   INR 5.4*  --  1.3*  --   --   --   --   --   --   --   --   HEPARINUNFRC  --   --   --   --   --   --   --   --   --  0.67 0.53  CREATININE 2.01*  --  1.69*  --   --  1.65*  --  1.57* 1.48*  --   --   TROPONINIHS 21* 40*  --  101* 116*  --   --   --   --   --   --     Estimated Creatinine Clearance: 54.8 mL/min (A) (by C-G formula based on SCr of 1.48 mg/dL (H)).  Medical History: Past Medical History:  Diagnosis Date   AAA (abdominal aortic aneurysm) (HCC)    a.) s/p EVAR in 08/2007 by Dr. Earnestine Leys. b.) CTA on 06/30/2021 --> interval aneurysmal dilitation superior to stent graft with the juxtarenal aorta measuring 3.8 cm.   Anemia    Arthritis    both knees   Chronic anticoagulation    Apixaban   Chronic HFrEF (heart failure with reduced ejection fraction) (HCC)    a. 07/2022 Echo: EF 35-40%, glob HK, mild LVH, GrI DD, nl RV fxn, mildly dil RA/LA, mild MR, mild AoV sclerosis. Asc Ao 40mm.   CKD (chronic kidney disease), stage II    Coronary artery disease    a. 06/2007 CABGx4 @ [facility] (LIMA-LAD, VG-RPDA, VG-OM3,VG-D2); b. 2011 Cath: patent grafts EF 40%, c.06/05/18 Cath: Sev native dzs, LIMA-LAD patent, VG-RPDA patent, VG-OM3 mid-graft 60% and 80%; d. 07/2022 Cath:  LM nl, LAD 50p, 90p/m, D1 70, RI  30, LCX 40ost/p, 100p CTO, OM3 100, RCA 100p CTO, LIMA->LAD ok, VG->D2 ok, VG->RPDA min irregs, VG->OM3 100->Med rx.   Depression    GERD (gastroesophageal reflux disease)    Hernia of abdominal cavity    History of 2019 novel coronavirus disease (COVID-19) 09/2019   Hyperlipidemia    Hypertension    Hypothyroidism    Iliac artery aneurysm, right (HCC)    a.) at the distal landing zone of the right iliac limb measuring 3 cm   Ischemic cardiomyopathy    Right middle lobe pulmonary nodule 06/30/2021  a.) measured 8 mm by CT on 06/30/2021.   S/P CABG x 4 06/25/2007   a.) LIMA-LAD, SVG-RPDA, SVG-OM3, SVG-D2   Sigmoid diverticulosis    Ventricular tachycardia (HCC) 05/2018   a.) found unresponsive by EMS; VT noted --> defibrillation achieved ROSC. Briefly intubated. Episode felt to be secondary to heat stoke.   Assessment: 72 y.o. male with a history of CAD status post CABG, HFrEF, AAA status post surgical repair in 2008, right iliac artery aneurysm status post endovascular repair in 2022, atrial fibrillation on Eliquis, CKD, hypertension, hyperlipidemia, hypothyroidism, anemia, GERD, and depression who presents with altered mental status after out of hospital cardiac arrest w/ ROSC after ~ 4 minutes.   Heparin was stopped 7/5 PM due to pink-tinged urine. Last aPTT was 140 with heparin at 1100 units/hr.   Goal of Therapy:  Heparin level 0.3-0.7 units/ml aPTT 66 - 102 seconds Monitor platelets by anticoagulation protocol: Yes   0706 1848 aPTT 70 ; HL 0.67 0707 0340 aPTT 64;  HL 0.53  Plan:  7/7 @ 0340:  aPTT = 64,  HL = 0.53 - aPTT and HL from previous sample were therapeutic, can use HL to dose from here on - HL = 0.53, therapeutic X 2 - Will continue pt on current rate and recheck HL on 7/8 with AM labs.  - CBC daily   Stuart Harvey D Clinical Pharmacist  05/21/2023 4:23 AM

## 2023-05-21 NOTE — Progress Notes (Signed)
Cardiology Progress Note   Patient Name: Stuart Harvey Date of Encounter: 05/21/2023  Primary Cardiologist: [name], MD  Subjective   Remains in aflutter, asymptomatic.  C/o chest wall pain r/t rib fx.  Family @ bedside.  Inpatient Medications    Scheduled Meds:  aspirin EC  81 mg Oral Daily   buPROPion ER  150 mg Oral BID   Chlorhexidine Gluconate Cloth  6 each Topical Daily   citalopram  40 mg Oral Daily   docusate  100 mg Per Tube BID   insulin aspart  0-5 Units Subcutaneous QHS   insulin aspart  0-9 Units Subcutaneous TID WC   levothyroxine  75 mcg Oral Q0600   midodrine  5 mg Oral TID WC   polyethylene glycol  17 g Per Tube Daily   potassium chloride  20 mEq Oral Once   rosuvastatin  40 mg Oral Daily   Continuous Infusions:  sodium chloride Stopped (05/18/23 2300)   amiodarone 60 mg/hr (05/21/23 0618)   heparin 900 Units/hr (05/21/23 0618)   PRN Meds: docusate sodium, ipratropium-albuterol, oxyCODONE, polyethylene glycol, traZODone   Vital Signs    Vitals:   05/21/23 0700 05/21/23 0758 05/21/23 0800 05/21/23 0821  BP:  126/82 124/77   Pulse:  71 74   Resp:  18 17   Temp:  97.7 F (36.5 C)    TempSrc:  Oral    SpO2: 95% 95% 95% 94%  Weight:      Height:        Intake/Output Summary (Last 24 hours) at 05/21/2023 0958 Last data filed at 05/21/2023 0939 Gross per 24 hour  Intake 1712.65 ml  Output 2125 ml  Net -412.35 ml   Filed Weights   05/19/23 0701 05/20/23 0500 05/21/23 0122  Weight: 95.4 kg 94.5 kg 95.2 kg    Physical Exam   GEN: Well nourished, well developed, in no acute distress.  HEENT: Grossly normal.  Neck: Supple, no JVD, carotid bruits, or masses. Cardiac: IR, IR, no murmurs, rubs, or gallops. No clubbing, cyanosis, edema.  Radials 2+, DP/PT 2+ and equal bilaterally.  Respiratory:  Respirations regular and unlabored, diminished breath sounds bilat. GI: Soft, nontender, nondistended, BS + x 4. MS: no deformity or  atrophy. Skin: warm and dry, no rash. Neuro:  Strength and sensation are intact. Psych: AAOx3.  Normal affect.  Labs    Chemistry Recent Labs  Lab 05/18/23 1627 05/18/23 2053 05/20/23 0535 05/20/23 1509 05/21/23 0340  NA 139   < > 137 135 136  K 5.7*   < > 3.5 3.4* 3.7  CL 112*   < > 105 104 104  CO2 10*   < > 23 24 25   GLUCOSE 160*   < > 108* 136* 109*  BUN 37*   < > 26* 24* 22  CREATININE 2.01*   < > 1.57* 1.48* 1.39*  CALCIUM 9.7   < > 8.4* 8.0* 8.3*  PROT 7.5  --   --   --   --   ALBUMIN 4.3  --   --   --   --   AST 43*  --   --   --   --   ALT 20  --   --   --   --   ALKPHOS 103  --   --   --   --   BILITOT 1.0  --   --   --   --   GFRNONAA 35*   < >  47* 50* 54*  ANIONGAP 17*   < > 9 7 7    < > = values in this interval not displayed.     Hematology Recent Labs  Lab 05/19/23 0444 05/20/23 0535 05/21/23 0340  WBC 14.6* 13.1* 10.5  RBC 4.39 4.09* 3.96*  HGB 13.2 12.3* 11.9*  HCT 41.4 38.3* 37.0*  MCV 94.3 93.6 93.4  MCH 30.1 30.1 30.1  MCHC 31.9 32.1 32.2  RDW 14.8 14.8 14.6  PLT 179 113* 114*    Cardiac Enzymes  Recent Labs  Lab 05/18/23 1627 05/18/23 1820 05/18/23 2247 05/19/23 0211  TROPONINIHS 21* 40* 101* 116*      BNP    Component Value Date/Time   BNP 513.5 (H) 05/18/2023 1627   Lipids  Lab Results  Component Value Date   CHOL 146 01/11/2023   HDL 52 01/11/2023   LDLCALC 76 01/11/2023   TRIG 120 05/19/2023   CHOLHDL 2.9 03/29/2022    HbA1c  Lab Results  Component Value Date   HGBA1C 5.7 (H) 05/20/2023    Radiology    DG Chest Port 1 View  Result Date: 05/20/2023 CLINICAL DATA:  Acute respiratory failure and hypoxia. EXAM: PORTABLE CHEST 1 VIEW COMPARISON:  05/19/2023 FINDINGS: Status post median sternotomy and CABG procedure. Fracture of the proximal 3 sternotomy wires are again noted. Right IJ catheter is noted with tip at the superior cavoatrial junction. Cardiomediastinal contours are stable. Interval decrease in lung  volumes with increased opacities in both lower lobes. No signs of interstitial edema or pleural effusion. IMPRESSION: Interval decrease in lung volumes with worsening aeration to both lower lobes. Electronically Signed   By: Signa Kell M.D.   On: 05/20/2023 07:21   CT Angio Chest/Abd/Pel for Dissection W and/or Wo Contrast  Result Date: 05/18/2023 CLINICAL DATA:  Post cardiac arrest EXAM: CT ANGIOGRAPHY CHEST, ABDOMEN AND PELVIS TECHNIQUE: Non-contrast CT of the chest was initially obtained. Multidetector CT imaging through the chest, abdomen and pelvis was performed using the standard protocol during bolus administration of intravenous contrast. Multiplanar reconstructed images and MIPs were obtained and reviewed to evaluate the vascular anatomy. RADIATION DOSE REDUCTION: This exam was performed according to the departmental dose-optimization program which includes automated exposure control, adjustment of the mA and/or kV according to patient size and/or use of iterative reconstruction technique. CONTRAST:  OMNIPAQUE IOHEXOL 350 MG/ML SOLN COMPARISON:  None Available. FINDINGS: CTA CHEST FINDINGS VASCULAR Aorta: Satisfactory opacification of the aorta. Normal contour and caliber of the thoracic aorta. No evidence of aneurysm, dissection, or other acute aortic pathology. Moderate mixed calcific atherosclerosis Cardiovascular: No evidence of pulmonary embolism on limited non-tailored examination. Cardiomegaly. Three-vessel coronary artery calcifications status post median sternotomy and CABG. No pericardial effusion. Review of the MIP images confirms the above findings. NON VASCULAR Mediastinum/Nodes: No enlarged mediastinal, hilar, or axillary lymph nodes. Thyroid gland, trachea, and esophagus demonstrate no significant findings. Lungs/Pleura: Endotracheal intubation, tip above the carina. Dependent bibasilar scarring or atelectasis. No pleural effusion or pneumothorax. Musculoskeletal: No chest wall  abnormality. Numerous nondisplaced fractures of the anterior ribs (series 6, image 61). Review of the MIP images confirms the above findings. CTA ABDOMEN AND PELVIS FINDINGS VASCULAR Status post aortobiiliac stent endograft repair of abdominal aortic aneurysm. No acute findings. Moderate mixed calcific atherosclerosis. Standard branching pattern of the abdominal aorta with solitary bilateral renal arteries. Review of the MIP images confirms the above findings. NON-VASCULAR Hepatobiliary: No solid liver abnormality is seen. No gallstones, gallbladder wall thickening, or biliary dilatation.  Pancreas: Unremarkable. No pancreatic ductal dilatation or surrounding inflammatory changes. Spleen: Normal in size without significant abnormality. Adrenals/Urinary Tract: Adrenal glands are unremarkable. Numerous bilateral renal cortical cysts, more numerous on the left, without obvious solid mass or suspicious contrast enhancement. No hydronephrosis. Kidneys are otherwise normal, without renal calculi, solid lesion, or hydronephrosis. Bladder is unremarkable. Stomach/Bowel: Esophagogastric tube with tip and side port below the diaphragm. Stomach is within normal limits. Appendix appears normal. No evidence of bowel wall thickening, distention, or inflammatory changes. Sigmoid diverticulosis Lymphatic: No enlarged abdominal or pelvic lymph nodes. Reproductive: No mass or other significant abnormality. Other: No abdominal wall hernia or abnormality. No ascites. Musculoskeletal: No acute osseous findings. IMPRESSION: 1. No evidence of thoracic aortic aneurysm, dissection, or other acute aortic pathology. 2. Status post aortobiiliac stent endograft repair of the abdominal aorta without acute findings. 3. No evidence of pulmonary embolism on limited non-tailored examination. 4. Numerous nondisplaced fractures of the anterior ribs. No pneumothorax. 5. Cardiomegaly and coronary artery disease status post median sternotomy and CABG. 6.  Sigmoid diverticulosis without evidence of acute diverticulitis. Aortic Atherosclerosis (ICD10-I70.0). Electronically Signed   By: Jearld Lesch M.D.   On: 05/18/2023 17:46   CT Head Wo Contrast  Result Date: 05/18/2023 CLINICAL DATA:  Cardiac arrest. EXAM: CT HEAD WITHOUT CONTRAST TECHNIQUE: Contiguous axial images were obtained from the base of the skull through the vertex without intravenous contrast. RADIATION DOSE REDUCTION: This exam was performed according to the departmental dose-optimization program which includes automated exposure control, adjustment of the mA and/or kV according to patient size and/or use of iterative reconstruction technique. COMPARISON:  MRI brain 11/23/2022 FINDINGS: Brain: There is no evidence for acute hemorrhage, hydrocephalus, mass lesion, or abnormal extra-axial fluid collection. No definite CT evidence for acute infarction. Vascular: No hyperdense vessel or unexpected calcification. Skull: No evidence for fracture. No worrisome lytic or sclerotic lesion. Sinuses/Orbits: The visualized paranasal sinuses and mastoid air cells are clear. Visualized portions of the globes and intraorbital fat are unremarkable. Other: Gas is identified in venous anatomy of the lower head bilaterally, likely reflecting IV access. IMPRESSION: 1. No acute intracranial abnormality. 2. Gas in superficial venous anatomy of the lower head bilaterally, likely reflecting IV access. Electronically Signed   By: Kennith Center M.D.   On: 05/18/2023 17:35   Telemetry    Aflutter, 80's to low 100's - Personally Reviewed  Cardiac Studies   2D Echocardiogram 7.5.2024   1. Left ventricular ejection fraction, by estimation, is 40 to 45%. The  left ventricle has mildly decreased function. The left ventricle  demonstrates regional wall motion abnormalities (inferolateral wall  hypokinesis). There is moderate asymmetric left  ventricular hypertrophy of the septal segment. Left ventricular diastolic   parameters are consistent with Grade I diastolic dysfunction (impaired  relaxation).   2. Right ventricular systolic function is normal. The right ventricular  size is normal. There is normal pulmonary artery systolic pressure. The  estimated right ventricular systolic pressure is 29.0 mmHg.   3. The mitral valve is normal in structure. No evidence of mitral valve  regurgitation. No evidence of mitral stenosis.   4. Tricuspid valve regurgitation is moderate.   5. The aortic valve is normal in structure. Aortic valve regurgitation is  not visualized. No aortic stenosis is present.   6. There is borderline dilatation of the aortic root, measuring 40 mm.   7. The inferior vena cava is normal in size with greater than 50%  respiratory variability, suggesting right atrial pressure of  3 mmHg.  _____________   Patient Profile     ADMA GAERTNER is a 72 y.o. male with a history of CAD s/p CABG x 4 in 2008 ([facility]), chronic HFrEF/ICM, PAF on eliquis, HTN, HL, infrarenal AAA s/p open repair 08/2007), R iliac artery aneurysm s/p EVAR 07/2021, hypothyroidism, anemia, depression, and GERD, admitted 7/4 following witnessed arrest @ home.   Assessment & Plan     1.  Cardiac arrest:  pt with witnessed loss of consciousness by wife w/ immediate bystander CPR.  Per ED notes, EMS reported that pt was pulseless upon their arrival but ROSC achieved after 4 additional mins of CPR.  Did not require ACLS medications.  Afib w/ RVR upon arrival to ED.  HsTrops mildly elevated 21   40   101   116.  Echo 7/5 w/ sl improvement in EF to 40% w/ inferolateral HK.  Pt recalls some events prior to syncope - was in garage, felt hot, sob, no c/p or palpitations.  Remains in afib/flutter on tele, though rates improved over pas 24 hrs on amio 60mg /hr. No ventricular arrhythmias noted.  EP to see Monday.   2.  Afib w/ RVR/Atrial Flutter:  pt presented in Afib @ 180.  Loaded w/ amio on 7/5.  As above, rates improved, mostly 80's.   Cont amio and heparin.  DCCV tomorrow morning. Informed Consent   Shared Decision Making/Informed Consent The risks (stroke, cardiac arrhythmias rarely resulting in the need for a temporary or permanent pacemaker, skin irritation or burns and complications associated with conscious sedation including aspiration, arrhythmia, respiratory failure and death), benefits (restoration of normal sinus rhythm) and alternatives of a direct current cardioversion were explained in detail to Stuart Harvey and he agrees to proceed.        3.  ICM/HFrEF:  EF 40% by echo 7/5.  Received IV lasix past 2 days.  CXR 7/6 w/ worsening aeration (also being tx for asp pna).   + 52.7 ml yesterday.   Minus 305.4 ml since admission.  Wt relatively stable @ 95.2 kg.  Does not appear to be markedly volume overloaded.  BPs remain soft - currently on midodrine and home doeses of ? blocker and entresto on hold.   4.  CAD/Demand Ischemia:   s/p prior CABG w/ 3/4 patent grafts and known multivessel dzs on cath in 07/2022.  Denies any recent c/p.  HsTrop mildly elevated 21   40   101   116.  As above, Echo w/ EF of 40%, inflat HK.  Defer ischemic eval @ this time.  Cont ASA and statin.  Home ? blocker on hold in setting of relative hypotension.   5.  Hypotension:  Pressures 90's to low 100's.  Home meds on hold.  Follow.   6.  HL:  resume rosuvastatin.   7.  Acute resp failure/Possible aspiration PNA:  weaned to 6 lpm [place].  Desats w/ ambulation.  Abx per IM.   8.  AKI/CKD III:  renal fxn stable.   9.  Hyperkalemia:  resolved.  Signed, Nicolasa Ducking, NP  05/21/2023, 9:58 AM    For questions or updates, please contact   Please consult [email or web address] for contact info under Cardiology/STEMI.

## 2023-05-22 ENCOUNTER — Inpatient Hospital Stay: Payer: Medicare HMO | Admitting: Anesthesiology

## 2023-05-22 ENCOUNTER — Encounter
Admission: EM | Disposition: A | Discharge status: Other Acute Inpatient Hospital | Payer: Self-pay | Source: Home / Self Care | Attending: Student in an Organized Health Care Education/Training Program

## 2023-05-22 DIAGNOSIS — I480 Paroxysmal atrial fibrillation: Secondary | ICD-10-CM | POA: Diagnosis present

## 2023-05-22 DIAGNOSIS — R55 Syncope and collapse: Secondary | ICD-10-CM | POA: Diagnosis not present

## 2023-05-22 DIAGNOSIS — J9601 Acute respiratory failure with hypoxia: Secondary | ICD-10-CM

## 2023-05-22 DIAGNOSIS — N179 Acute kidney failure, unspecified: Secondary | ICD-10-CM | POA: Diagnosis present

## 2023-05-22 DIAGNOSIS — E876 Hypokalemia: Secondary | ICD-10-CM | POA: Diagnosis not present

## 2023-05-22 DIAGNOSIS — I483 Typical atrial flutter: Secondary | ICD-10-CM | POA: Diagnosis not present

## 2023-05-22 DIAGNOSIS — I255 Ischemic cardiomyopathy: Secondary | ICD-10-CM | POA: Diagnosis not present

## 2023-05-22 DIAGNOSIS — I4892 Unspecified atrial flutter: Secondary | ICD-10-CM

## 2023-05-22 DIAGNOSIS — R001 Bradycardia, unspecified: Secondary | ICD-10-CM

## 2023-05-22 DIAGNOSIS — N183 Chronic kidney disease, stage 3 unspecified: Secondary | ICD-10-CM | POA: Diagnosis present

## 2023-05-22 DIAGNOSIS — E875 Hyperkalemia: Secondary | ICD-10-CM | POA: Diagnosis present

## 2023-05-22 DIAGNOSIS — I469 Cardiac arrest, cause unspecified: Secondary | ICD-10-CM | POA: Diagnosis not present

## 2023-05-22 HISTORY — PX: CARDIOVERSION: SHX1299

## 2023-05-22 LAB — GLUCOSE, CAPILLARY
Glucose-Capillary: 94 mg/dL (ref 70–99)
Glucose-Capillary: 114 mg/dL — ABNORMAL HIGH (ref 70–99)
Glucose-Capillary: 109 mg/dL — ABNORMAL HIGH (ref 70–99)
Glucose-Capillary: 124 mg/dL — ABNORMAL HIGH (ref 70–99)

## 2023-05-22 LAB — CBC
HCT: 36.6 % — ABNORMAL LOW (ref 39.0–52.0)
Hemoglobin: 12 g/dL — ABNORMAL LOW (ref 13.0–17.0)
MCH: 30.2 pg (ref 26.0–34.0)
MCHC: 32.8 g/dL (ref 30.0–36.0)
MCV: 92 fL (ref 80.0–100.0)
Platelets: 126 10*3/uL — ABNORMAL LOW (ref 150–400)
RBC: 3.98 MIL/uL — ABNORMAL LOW (ref 4.22–5.81)
RDW: 14.3 % (ref 11.5–15.5)
WBC: 10.4 10*3/uL (ref 4.0–10.5)
nRBC: 0 % (ref 0.0–0.2)

## 2023-05-22 LAB — BASIC METABOLIC PANEL
Anion gap: 9 (ref 5–15)
BUN: 16 mg/dL (ref 8–23)
CO2: 24 mmol/L (ref 22–32)
Calcium: 8.5 mg/dL — ABNORMAL LOW (ref 8.9–10.3)
Chloride: 101 mmol/L (ref 98–111)
Creatinine, Ser: 1.17 mg/dL (ref 0.61–1.24)
GFR, Estimated: >60 mL/min (ref >60)
Glucose, Bld: 112 mg/dL — ABNORMAL HIGH (ref 70–99)
Potassium: 3.4 mmol/L — ABNORMAL LOW (ref 3.5–5.1)
Sodium: 134 mmol/L — ABNORMAL LOW (ref 135–145)

## 2023-05-22 LAB — HEPARIN LEVEL (UNFRACTIONATED): Heparin Unfractionated: 0.39 IU/mL (ref 0.30–0.70)

## 2023-05-22 SURGERY — CARDIOVERSION
Anesthesia: General

## 2023-05-22 MED ORDER — PROPOFOL 10 MG/ML IV BOLUS
INTRAVENOUS | Status: DC | PRN
  Administered 2023-05-22: 70 mg via INTRAVENOUS

## 2023-05-22 MED ORDER — METOPROLOL SUCCINATE ER 25 MG PO TB24
12.5000 mg | ORAL_TABLET | Freq: Every day | ORAL | Status: DC
  Administered 2023-05-22 – 2023-05-25 (×4): 12.5 mg via ORAL
  Filled 2023-05-22 (×3): qty 1
  Filled 2023-05-22: qty 0.5

## 2023-05-22 MED ORDER — PROPOFOL 10 MG/ML IV BOLUS
INTRAVENOUS | Status: AC
  Filled 2023-05-22: qty 20

## 2023-05-22 MED ORDER — LIDOCAINE HCL (PF) 2 % IJ SOLN
INTRAMUSCULAR | Status: DC | PRN
  Administered 2023-05-22: 50 mg via INTRADERMAL

## 2023-05-22 MED ORDER — POTASSIUM CHLORIDE CRYS ER 20 MEQ PO TBCR
40.0000 meq | EXTENDED_RELEASE_TABLET | Freq: Once | ORAL | Status: AC
  Administered 2023-05-22: 40 meq via ORAL
  Filled 2023-05-22: qty 2

## 2023-05-22 MED ORDER — CITALOPRAM HYDROBROMIDE 20 MG PO TABS
20.0000 mg | ORAL_TABLET | Freq: Every day | ORAL | Status: DC
  Administered 2023-05-23 – 2023-05-25 (×3): 20 mg via ORAL
  Filled 2023-05-22 (×3): qty 1

## 2023-05-22 MED ORDER — APIXABAN 5 MG PO TABS
5.0000 mg | ORAL_TABLET | Freq: Two times a day (BID) | ORAL | Status: DC
  Administered 2023-05-22 – 2023-05-25 (×7): 5 mg via ORAL
  Filled 2023-05-22 (×7): qty 1

## 2023-05-22 NOTE — Progress Notes (Signed)
Occupational Therapy Treatment Patient Details Name: Stuart Harvey MRN: [id number] DOB: 16-Aug-1951 Today's Date: 05/22/2023   History of present illness 72 y/o male presented to ED on 05/18/23 for cardiac arrest s/p CPR with ROSC after ~4 minutes. Intubated 7/4-7/5. PMH: CAD s/p CABG, HFrEF, AAA s/p surgical repair in 2008, R iliac artery aneurysm s/p endovascular repair in 2022, Afib on Eliquis, CKD, HTN, anemia, depression.   OT comments  [name] was seen for OT treatment on this date. Upon arrival to room pt supine in bed, agreeable to OT Tx session. OT facilitated ADL management with assist and education as described below. See ADL section for additional details regarding occupational performance. Pt continues to be functionally limited by cardiopulmonary status, decreased activity tolerance, and limited safety awareness. He requires SUPERVISION for safety t/o ADL management. Pt return verbalizes understanding of education provided t/o session. Pt is progressing toward OT goals and continues to benefit from skilled OT services to maximize return to PLOF and minimize risk of future falls, injury, caregiver burden, and readmission. Will continue to follow POC as written. Discharge recommendation remains appropriate.     Recommendations for follow up therapy are one component of a multi-disciplinary discharge planning process, led by the attending physician.  Recommendations may be updated based on patient status, additional functional criteria and insurance authorization.    Assistance Recommended at Discharge Intermittent Supervision/Assistance  Patient can return home with the following  A little help with walking and/or transfers;A little help with bathing/dressing/bathroom;Help with stairs or ramp for entrance;Assist for transportation;Assistance with cooking/housework   Equipment Recommendations  None recommended by OT    Recommendations for Other Services      Precautions /  Restrictions Precautions Precautions: Fall Precaution Comments: watch O2 Restrictions Weight Bearing Restrictions: No       Mobility Bed Mobility Overal bed mobility: Needs Assistance Bed Mobility: Supine to Sit     Supine to sit: HOB elevated, Supervision     General bed mobility comments: Increased time/effort to perform with assist for mgt of lines/leads    Transfers Overall transfer level: Needs assistance Equipment used: 1 person hand held assist Transfers: Sit to/from Stand, Bed to chair/wheelchair/BSC Sit to Stand: Supervision, From elevated surface     Step pivot transfers: Min guard           Balance Overall balance assessment: Needs assistance Sitting-balance support: Feet supported, Bilateral upper extremity supported Sitting balance-Leahy Scale: Normal     Standing balance support: Single extremity supported, During functional activity Standing balance-Leahy Scale: Good                             ADL either performed or assessed with clinical judgement   ADL Overall ADL's : Needs assistance/impaired Eating/Feeding: Set up;Sitting Eating/Feeding Details (indicate cue type and reason): Has difficulty reaching items on far edge of tray 2/2 increased sternal pain from CPR.                     Toilet Transfer: Supervision/safety;Set up Toilet Transfer Details (indicate cue type and reason): Supervision for standing use of urinal at EOB. Toileting- Clothing Manipulation and Hygiene: Set up;Supervision/safety       Functional mobility during ADLs: Min guard;Cueing for safety General ADL Comments: HHA for functional mobility in room, no physical assist required, but pt requests 1 UE support to mobilize to room recliner. He is able to perform additional ADL management from STS  with supervision for safety. Pt fatigues quickly but demonstrates improved activity tolerance from previous session.    Extremity/Trunk Assessment               Vision Patient Visual Report: No change from baseline     Perception     Praxis      Cognition Arousal/Alertness: Awake/alert Behavior During Therapy: WFL for tasks assessed/performed, Impulsive Overall Cognitive Status: Within Functional Limits for tasks assessed                                 General Comments: Min cueing for safety and mgt of lines/leads. Pt mildly agitated by "all the dang lines they got me hooked up to".        Exercises Other Exercises Other Exercises: OT facilitated ADL management with assist as described above. Education on safety, falls prevention, and energy conservation strategies provided t/o session.    Shoulder Instructions       General Comments Pt on RA t/o session. spO2 noted to briefly drop to 86% after functional transfer, however pt rebounds quickly to 94-96% with therapeutic rest break and cues for PLB. Pt denies difficulty with breathing outside of chest pain from CPR compressions with deep inhalations.    Pertinent Vitals/ Pain       Pain Assessment Pain Score: 4  Faces Pain Scale: Hurts even more Pain Location: chest Pain Descriptors / Indicators: Aching, Discomfort, Grimacing, Guarding Pain Intervention(s): Limited activity within patient's tolerance, Monitored during session, Repositioned  Home Living                                          Prior Functioning/Environment              Frequency  Min 1X/week        Progress Toward Goals  OT Goals(current goals can now be found in the care plan section)  Progress towards OT goals: Progressing toward goals  Acute Rehab OT Goals Patient Stated Goal: to feel better OT Goal Formulation: With patient Time For Goal Achievement: 06/03/23 Potential to Achieve Goals: Good  Plan Discharge plan remains appropriate;Frequency remains appropriate    Co-evaluation                 AM-PAC OT "6 Clicks" Daily Activity     Outcome  Measure   Help from another person eating meals?: None Help from another person taking care of personal grooming?: A Little Help from another person toileting, which includes using toliet, bedpan, or urinal?: A Little Help from another person bathing (including washing, rinsing, drying)?: A Little Help from another person to put on and taking off regular upper body clothing?: A Little Help from another person to put on and taking off regular lower body clothing?: A Little 6 Click Score: 19    End of Session    OT Visit Diagnosis: Other abnormalities of gait and mobility (R26.89);Muscle weakness (generalized) (M62.81)   Activity Tolerance Patient tolerated treatment well   Patient Left in chair;with call bell/phone within reach;with family/visitor present   Nurse Communication Mobility status;Other (comment)        Time: 1610-9604 OT Time Calculation (min): 18 min  Charges: OT General Charges $OT Visit: 1 Visit OT Treatments $Self Care/Home Management : 8-22 mins  Rockney Ghee, M.S., OTR/L 05/22/23, 2:01 PM

## 2023-05-22 NOTE — CV Procedure (Signed)
Cardioversion note: A standard informed consent was obtained. Timeout was performed. The pads were placed in the anterior posterior fashion. The patient was given propofol by the anesthesia team.  Successful cardioversion was performed with a 100 J. The patient converted to sinus rhythm. Pre-and post EKGs were reviewed. The patient tolerated the procedure with no immediate complications.  Recommendations: Continue anticoagulation.  See progress note.

## 2023-05-22 NOTE — Progress Notes (Signed)
Physical Therapy Treatment Patient Details Name: Stuart Harvey MRN: [id number] DOB: 12/21/1950 Today's Date: 05/22/2023   History of Present Illness 72 y/o male presented to ED on 05/18/23 for cardiac arrest s/p CPR with ROSC after ~4 minutes. Intubated 7/4-7/5. PMH: CAD s/p CABG, HFrEF, AAA s/p surgical repair in 2008, R iliac artery aneurysm s/p endovascular repair in 2022, Afib on Eliquis, CKD, HTN, anemia, depression.    PT Comments  Patient sitting in recliner eating sherbet with wife and nursing in room. Pt states their chest is hurting more and is 8/10 when they transfer. BP at rest 144/82. Sit<>stand supervision with RW, patient impulsive and attempts to ambulate quickly without SPT. Pt cued to wait for SPT and to take his time with gait. Ambulated 165 ft with RW, HR and O2 monitored. Spo2 remained between 88-94% during ambulation. Returned to chair with call bell, nurse and family in room    Assistance Recommended at Discharge Intermittent Supervision/Assistance  If plan is discharge home, recommend the following:  Can travel by private vehicle    A little help with walking and/or transfers;A little help with bathing/dressing/bathroom;Assistance with cooking/housework;Assist for transportation;Help with stairs or ramp for entrance      Equipment Recommendations  Rolling walker (2 wheels)    Recommendations for Other Services       Precautions / Restrictions Precautions Precautions: Fall Precaution Comments: watch O2 Restrictions Weight Bearing Restrictions: No     Mobility  Bed Mobility               General bed mobility comments: Pt in recliner at start and end of session.    Transfers Overall transfer level: Needs assistance Equipment used: Rolling walker (2 wheels) Transfers: Sit to/from Stand Sit to Stand: Supervision           General transfer comment: VC needed to push up from recliner instead of walker.    Ambulation/Gait Ambulation/Gait  assistance: Min guard Gait Distance (Feet): 165 Feet Assistive device: Rolling walker (2 wheels) Gait Pattern/deviations: Step-through pattern, Narrow base of support, Trunk flexed       General Gait Details: Needs cueing for safety due to impulsivity.   Stairs             Wheelchair Mobility     Tilt Bed    Modified Rankin (Stroke Patients Only)       Balance Overall balance assessment: Needs assistance Sitting-balance support: Feet supported, Bilateral upper extremity supported Sitting balance-Leahy Scale: Normal Sitting balance - Comments: no LOB reaching outside BOS for walker to bring it closer.   Standing balance support: Bilateral upper extremity supported, Reliant on assistive device for balance, During functional activity Standing balance-Leahy Scale: Good                              Cognition Arousal/Alertness: Awake/alert Behavior During Therapy: WFL for tasks assessed/performed, Impulsive Overall Cognitive Status: Within Functional Limits for tasks assessed                                 General Comments: Min cueing for safety and mgt of lines/leads. Pt mildly agitated by lines and leads. Pt requesting a shower because a sponge bath doesnt count.        Exercises      General Comments General comments (skin integrity, edema, etc.): Pt on RA t/o session. spO2 noted to  briefly drop to 86% after functional transfer, however pt rebounds quickly to 94-96% with therapeutic rest break and cues for PLB. Pt denies difficulty with breathing outside of chest pain from CPR compressions with deep inhalations.      Pertinent Vitals/Pain Pain Assessment Pain Assessment: 0-10 Pain Score: 8  Pain Location: chest Pain Descriptors / Indicators: Aching, Discomfort, Grimacing, Guarding Pain Intervention(s): Limited activity within patient's tolerance, Monitored during session    Home Living                          Prior  Function            PT Goals (current goals can now be found in the care plan section) Acute Rehab PT Goals PT Goal Formulation: With patient Time For Goal Achievement: 06/03/23 Potential to Achieve Goals: Good Progress towards PT goals: Progressing toward goals    Frequency    Min 1X/week      PT Plan Current plan remains appropriate    Co-evaluation              AM-PAC PT "6 Clicks" Mobility   Outcome Measure  Help needed turning from your back to your side while in a flat bed without using bedrails?: A Little Help needed moving from lying on your back to sitting on the side of a flat bed without using bedrails?: A Little Help needed moving to and from a bed to a chair (including a wheelchair)?: A Little Help needed standing up from a chair using your arms (e.g., wheelchair or bedside chair)?: A Little Help needed to walk in hospital room?: A Little Help needed climbing 3-5 steps with a railing? : A Lot 6 Click Score: 17    End of Session Equipment Utilized During Treatment: Oxygen Activity Tolerance: Patient tolerated treatment well Patient left: in chair;with call bell/phone within reach;with nursing/sitter in room;with family/visitor present Nurse Communication: Mobility status PT Visit Diagnosis: Unsteadiness on feet (R26.81);Muscle weakness (generalized) (M62.81);Other abnormalities of gait and mobility (R26.89)     Time: 8119-1478 PT Time Calculation (min) (ACUTE ONLY): 21 min  Charges:                            Malachi Carl, SPT    Malachi Carl 05/22/2023, 4:23 PM

## 2023-05-22 NOTE — Progress Notes (Signed)
ANTICOAGULATION CONSULT NOTE  Pharmacy Consult for heparin infusion Indication: atrial fibrillation  Patient Measurements: Height: 6' (182.9 cm) Weight: 95.4 kg (210 lb 5.1 oz) IBW/kg (Calculated) : 77.6 Heparin Dosing Weight: 94.5 kg  Labs: Recent Labs    05/19/23 1741 05/20/23 0535 05/20/23 0535 05/20/23 1509 05/20/23 1848 05/21/23 0340 05/22/23 0455  HGB  --  12.3*   < >  --   --  11.9* 12.0*  HCT  --  38.3*  --   --   --  37.0* 36.6*  PLT  --  113*  --   --   --  114* 126*  APTT 140*  --   --   --  70* 64*  --   HEPARINUNFRC  --   --   --   --  0.67 0.53 0.39  CREATININE  --  1.57*  --  1.48*  --  1.39*  --    < > = values in this interval not displayed.    Estimated Creatinine Clearance: 58.4 mL/min (A) (by C-G formula based on SCr of 1.39 mg/dL (H)).  Medical History: Past Medical History:  Diagnosis Date   AAA (abdominal aortic aneurysm) (HCC)    a.) s/p EVAR in 08/2007 by Dr. Earnestine Leys. b.) CTA on 06/30/2021 --> interval aneurysmal dilitation superior to stent graft with the juxtarenal aorta measuring 3.8 cm.   Anemia    Arthritis    both knees   Chronic anticoagulation    Apixaban   Chronic HFrEF (heart failure with reduced ejection fraction) (HCC)    a. 07/2022 Echo: EF 35-40%, glob HK, mild LVH, GrI DD, nl RV fxn, mildly dil RA/LA, mild MR, mild AoV sclerosis. Asc Ao 40mm.   CKD (chronic kidney disease), stage II    Coronary artery disease    a. 06/2007 CABGx4 @ [facility] (LIMA-LAD, VG-RPDA, VG-OM3,VG-D2); b. 2011 Cath: patent grafts EF 40%, c.06/05/18 Cath: Sev native dzs, LIMA-LAD patent, VG-RPDA patent, VG-OM3 mid-graft 60% and 80%; d. 07/2022 Cath: LM nl, LAD 50p, 90p/m, D1 70, RI  30, LCX 40ost/p, 100p CTO, OM3 100, RCA 100p CTO, LIMA->LAD ok, VG->D2 ok, VG->RPDA min irregs, VG->OM3 100->Med rx.   Depression    GERD (gastroesophageal reflux disease)    Hernia of abdominal cavity    History of 2019 novel coronavirus disease (COVID-19) 09/2019   Hyperlipidemia     Hypertension    Hypothyroidism    Iliac artery aneurysm, right (HCC)    a.) at the distal landing zone of the right iliac limb measuring 3 cm   Ischemic cardiomyopathy    Right middle lobe pulmonary nodule 06/30/2021   a.) measured 8 mm by CT on 06/30/2021.   S/P CABG x 4 06/25/2007   a.) LIMA-LAD, SVG-RPDA, SVG-OM3, SVG-D2   Sigmoid diverticulosis    Ventricular tachycardia (HCC) 05/2018   a.) found unresponsive by EMS; VT noted --> defibrillation achieved ROSC. Briefly intubated. Episode felt to be secondary to heat stoke.   Assessment: 72 y.o. male with a history of CAD status post CABG, HFrEF, AAA status post surgical repair in 2008, right iliac artery aneurysm status post endovascular repair in 2022, atrial fibrillation on Eliquis, CKD, hypertension, hyperlipidemia, hypothyroidism, anemia, GERD, and depression who presents with altered mental status after out of hospital cardiac arrest w/ ROSC after ~ 4 minutes.   Heparin was stopped 7/5 PM due to pink-tinged urine. Last aPTT was 140 with heparin at 1100 units/hr.   Goal of Therapy:  Heparin level 0.3-0.7 units/ml  aPTT 66 - 102 seconds Monitor platelets by anticoagulation protocol: Yes   0706 1848 aPTT 70 ; HL 0.67 0707 0340 aPTT 64;  HL 0.53 0708 0455 HL 0.39, therapeutic X 3   Plan:  0708:  HL @ 0455 = 0.39, therapeutic X 3 - Will continue pt on current rate and recheck HL on 7/9 with AM labs.  - CBC daily   Hyden Soley D Clinical Pharmacist  05/22/2023 5:29 AM

## 2023-05-22 NOTE — Anesthesia Preprocedure Evaluation (Signed)
Anesthesia Evaluation  Patient identified by MRN, date of birth, ID band Patient awake    Reviewed: Allergy & Precautions, H&P , NPO status , Patient's Chart, lab work & pertinent test results  Airway Mallampati: II  TM Distance: >3 FB Neck ROM: full    Dental no notable dental hx.    Pulmonary former smoker 4L Shorter oxygen requirement suspected pneumonitis from aspiration. Extubated 7/5   Pulmonary exam normal        Cardiovascular hypertension, + CAD, + CABG, + Peripheral Vascular Disease (Infrarenal AAA (s/p open repair 2008 and endovascular repair in 2022)) and +CHF (HFrEF)  + dysrhythmias Atrial Fibrillation   Chest pain with big breath   Neuro/Psych  PSYCHIATRIC DISORDERS  Depression    negative neurological ROS     GI/Hepatic Neg liver ROS,GERD  ,,  Endo/Other  Hypothyroidism    Renal/GU Renal InsufficiencyRenal disease  negative genitourinary   Musculoskeletal  (+) Arthritis ,    Abdominal   Peds  Hematology  (+) Blood dyscrasia, anemia thrombocytopenia   Anesthesia Other Findings Cardiology Note Reviewed: A/p: Cardiac arrest Shortness of breath, hot, dizzy while sitting on stool in his garage, subsequent syncope, CPR given by wife and EMS on arrival, ROSC after several minutes No ventricular arrhythmia recorded at the scene Presenting in atrial fibrillation, converting to flutter Intubated on arrival to the ER, now extubated July 5, appears grossly neurologically intact Minimally elevated troponin 116, nontrending Less likely primary ischemic event echocardiogram with EF 40 to 45% which is grossly unchanged from prior studies- -no immediate plans for ischemic workup Blood pressure low limiting goal-directed medical therapy -EP consulted Prior episode of syncope 2019, found by his wife   Atrial fibs/flutter Initially placed on heparin infusion in place of Eliquis -Family reports compliance with his  Eliquis -Started on amiodarone July 5 for elevated rate in the setting of hypotension,  in effort to restore normal sinus rhythm Scheduled for cardioversion tomorrow morning   Ischemic cardiomyopathy  ejection fraction 40%, known coronary disease, history of CABG catheterization in September 2023 Ischemic workup deferred at this time Continue aspirin, statin beta-blocker if blood pressure improves -Appears relatively euvolemic   Acute hypoxic respiratory failure No smoking history Appears relatively euvolemic, CVP estimated 5-8 On Lasix 20 IV daily, will hold for worsening renal dysfunction Suspect possible pneumonitis, aspiration in the setting of cardiac arrest Now extubated, has been weaned down to 4 L   Chest pain Chest x-ray confirming fracture of proximal 3 sternotomy wires   Past Medical History: No date: AAA (abdominal aortic aneurysm) (HCC)     Comment:  a.) s/p EVAR in 08/2007 by Dr. Earnestine Leys. b.) CTA on               06/30/2021 --> interval aneurysmal dilitation superior to              stent graft with the juxtarenal aorta measuring 3.8 cm. No date: Anemia No date: Arthritis     Comment:  both knees No date: Chronic anticoagulation     Comment:  Apixaban No date: Chronic HFrEF (heart failure with reduced ejection fraction)  (HCC)     Comment:  a. 07/2022 Echo: EF 35-40%, glob HK, mild LVH, GrI DD, nl              RV fxn, mildly dil RA/LA, mild MR, mild AoV sclerosis.               Asc Ao 40mm. No date: CKD (chronic  kidney disease), stage II No date: Coronary artery disease     Comment:  a. 06/2007 CABGx4 @ [facility] (LIMA-LAD, VG-RPDA,               VG-OM3,VG-D2); b. 2011 Cath: patent grafts EF 40%,               c.06/05/18 Cath: Sev native dzs, LIMA-LAD patent, VG-RPDA               patent, VG-OM3 mid-graft 60% and 80%; d. 07/2022 Cath: LM               nl, LAD 50p, 90p/m, D1 70, RI  30, LCX 40ost/p, 100p CTO,              OM3 100, RCA 100p CTO, LIMA->LAD ok, VG->D2 ok,  VG->RPDA               min irregs, VG->OM3 100->Med rx. No date: Depression No date: GERD (gastroesophageal reflux disease) No date: Hernia of abdominal cavity 09/2019: History of 2019 novel coronavirus disease (COVID-19) No date: Hyperlipidemia No date: Hypertension No date: Hypothyroidism No date: Iliac artery aneurysm, right (HCC)     Comment:  a.) at the distal landing zone of the right iliac limb               measuring 3 cm No date: Ischemic cardiomyopathy 06/30/2021: Right middle lobe pulmonary nodule     Comment:  a.) measured 8 mm by CT on 06/30/2021. 06/25/2007: S/P CABG x 4     Comment:  a.) LIMA-LAD, SVG-RPDA, SVG-OM3, SVG-D2 No date: Sigmoid diverticulosis 05/2018: Ventricular tachycardia (HCC)     Comment:  a.) found unresponsive by EMS; VT noted -->               defibrillation achieved ROSC. Briefly intubated. Episode               felt to be secondary to heat stoke.  Past Surgical History: 08/29/2007: ABDOMINAL AORTIC ANEURYSM REPAIR; N/A     Comment:  Procedure: EVAR; Location: [facility]; Surgeon: Brynda Greathouse,               MD 12/05/2012: ABDOMINAL AORTIC ANEURYSM REPAIR; N/A     Comment:  Procedure: Abdominal aortic aneurysm of approximately 6               cm in maximal diameter status post previous endovascular               repair with type I and III endoleaks; Location: [facility];               Surgeon: Festus Barren, MD 06/28/2010: CARDIAC CATHETERIZATION; Left     Comment:  3v CAD with patent CABG grafts; LVEF 40%; stable aortic               stent graft; Location: [facility]; Surgeon: Arnoldo Hooker, MD 06/18/2007: CARDIAC CATHETERIZATION; Left     Comment:  3v CAD; LVEF 50%; refer to CVTS for CABG; Location:               [facility]; Surgeon: [name], MD 11/17/2021: COLONOSCOPY WITH PROPOFOL; N/A     Comment:  Procedure: COLONOSCOPY WITH PROPOFOL;  Surgeon: Toney Reil, MD;  Location: [facility] ENDOSCOPY;  Service:               Gastroenterology;   Laterality: N/A; 06/25/2007: CORONARY ARTERY  BYPASS GRAFT; N/A     Comment:  Procedure: 4v CABG (LIMA-LAD, SVG-RPDA, SVG-OM3,               SVG-D2); Location: [facility]; Surgeon: Marshell Garfinkel, MD 06/05/2018: Rosalie Doctor ANGIOGRAPHY; N/A     Comment:  Procedure: CORONARY/GRAFT ANGIOGRAPHY;  Surgeon: Yvonne Kendall, MD;  Location: [facility] INVASIVE CV LAB;                Service: Cardiovascular;  Laterality: N/A; 08/04/2021: ENDOVASCULAR REPAIR/STENT GRAFT; N/A     Comment:  Procedure: ENDOVASCULAR REPAIR/STENT GRAFT;  Surgeon:               Annice Needy, MD;  Location: [facility] INVASIVE CV LAB;                Service: Cardiovascular;  Laterality: N/A; 1997: EXPLORATORY LAPAROTOMY No date: HERNIA REPAIR 09/19/2016: KNEE ARTHROPLASTY; Left     Comment:  Procedure: COMPUTER ASSISTED TOTAL KNEE ARTHROPLASTY;                Surgeon: Donato [name], MD;  Location: [facility] ORS;                Service: Orthopedics;  Laterality: Left; 08/05/2022: RIGHT/LEFT HEART CATH AND CORONARY ANGIOGRAPHY; N/A     Comment:  Procedure: RIGHT/LEFT HEART CATH AND CORONARY               ANGIOGRAPHY;  Surgeon: Iran Ouch, MD;  Location:               [facility] INVASIVE CV LAB;  Service: Cardiovascular;                Laterality: N/A; 09/2016: TOTAL KNEE ARTHROPLASTY; Right  BMI    Body Mass Index: 28.52 kg/m      Reproductive/Obstetrics negative OB ROS                              Anesthesia Physical Anesthesia Plan  ASA: 4  Anesthesia Plan: General   Post-op Pain Management:    Induction: Intravenous  PONV Risk Score and Plan: Propofol infusion and TIVA  Airway Management Planned: Natural Airway  Additional Equipment:   Intra-op Plan:   Post-operative Plan:   Informed Consent: I have reviewed the patients History and Physical, chart, labs and discussed the procedure including the risks, benefits and alternatives for the proposed anesthesia with the patient or  authorized representative who has indicated his/her understanding and acceptance.     Dental Advisory Given  Plan Discussed with: CRNA and Surgeon  Anesthesia Plan Comments:          Anesthesia Quick Evaluation

## 2023-05-22 NOTE — Consult Note (Signed)
PHARMACY CONSULT NOTE  Pharmacy Consult for Electrolyte Monitoring and Replacement   Recent Labs: Potassium (mmol/L)  Date Value  05/22/2023 3.4 (L)  02/20/2015 4.5   Magnesium (mg/dL)  Date Value  16/08/9603 2.1  12/06/2012 1.8   Calcium (mg/dL)  Date Value  54/07/8118 8.5 (L)   Calcium, Total (mg/dL)  Date Value  14/78/2956 8.8 (L)   Albumin (g/dL)  Date Value  21/30/8657 4.3  10/11/2022 4.1  12/13/2014 3.9   Phosphorus (mg/dL)  Date Value  84/69/6295 4.4  12/06/2012 3.3   Sodium (mmol/L)  Date Value  05/22/2023 134 (L)  10/11/2022 141  02/20/2015 135     Assessment: 72 y.o. male with a history of CAD status post CABG, HFrEF, AAA status post surgical repair in 2008, right iliac artery aneurysm status post endovascular repair in 2022, atrial fibrillation on Eliquis, CKD, hypertension, hyperlipidemia, hypothyroidism, anemia, GERD, and depression who presents with altered mental status after out of hospital cardiac arrest w/ ROSC after ~ 4 minutes. Pharmacy has been consulted to monitor and replace electrolytes while in ICU.  Goal of Therapy:  Potassium  4.0 - 5.1 mmol/L Magnesium  2.0 - 2.4 mg/dL All other electrolytes WNL  Plan:  Kcl 40 meq x 1  F/u with AM labs.   Jaynie Bream, PharmD, BCPS Clinical Pharmacist   05/22/2023 2:35 PM

## 2023-05-22 NOTE — Consult Note (Addendum)
ELECTROPHYSIOLOGY CONSULT NOTE    Patient ID: Stuart Harvey MRN: [id number], DOB/AGE: Feb 06, 1951 72 y.o.  Admit date: 05/18/2023 Date of Consult: 05/22/2023  Primary Physician: Marjie Skiff, NP Primary Cardiologist: [name], MD  Electrophysiologist: none, new to Dr. York Pellant   Referring Provider: Dr. Myriam Forehand  Patient Profile: Stuart Harvey is a 72 y.o. male with a history of CAD s/p CABG, HFrEF, ICM, parox AFib on Eliquis, HTN who is being seen today for the evaluation of cardiac arrest at the request of Dr. Myriam Forehand.  HPI:  Stuart Harvey is a 72 y.o. male with above PMH who presented to [facility] ER after witnessed arrest. The patient was working in his garage July 4th, become SOB and lightheaded. He has a large fan in the garage for temp control. His wife came into garage, patient was sitting on stool with head in hands and told her he didn't feel well. Patient then had LOC. Wife noted that patient was blue and started CPR. Patient and wife are unsure how long patient was sitting with head in hands, they guess about 10 minutes.  Per EMS run sheet, on their arrival patient was blue from nipple line up with agonal respirations, no palpable pulse.  Initial rhythm strip per EMS:    Initial EKG per EMS:   No medications given by EMS.  When patient arrived at [facility] ER, he was in AFib w RVR. Intubated on admission, has now been extubated. Updated echo showed LVEF 40-45% (unchanged from prior). There are no plans for further ischemic workup per gen cards.  His ventricular rates have remained elevated with soft BPs, so amiodarone load + gtt was started. He is now s/p DCCV morning of 7/8 with return to NSR.  Of note, patient has history of similar episode 05/2018 where he was found down on back porch by wife. Per EMR, EMS found patient to be in VT and CV x 1 prior to arrival at [facility] ER.   05/2018 EMS Run sheet--    On interview, he has chest soreness and some sinus congestion.  Denies palpitations, DOE, N/V, dizziness, syncope, edema.    Labs Potassium3.7 (07/07 0340) Magnesium  2.1 (07/07 0340) Creatinine, ser  1.39* (07/07 0340) PLT  126* (07/08 0455) HGB  12.0* (07/08 0455) WBC 10.4 (07/08 0455)  .    Past Medical History:  Diagnosis Date   AAA (abdominal aortic aneurysm) (HCC)    a.) s/p EVAR in 08/2007 by Dr. Earnestine Leys. b.) CTA on 06/30/2021 --> interval aneurysmal dilitation superior to stent graft with the juxtarenal aorta measuring 3.8 cm.   Anemia    Arthritis    both knees   Chronic anticoagulation    Apixaban   Chronic HFrEF (heart failure with reduced ejection fraction) (HCC)    a. 07/2022 Echo: EF 35-40%, glob HK, mild LVH, GrI DD, nl RV fxn, mildly dil RA/LA, mild MR, mild AoV sclerosis. Asc Ao 40mm.   CKD (chronic kidney disease), stage II    Coronary artery disease    a. 06/2007 CABGx4 @ [facility] (LIMA-LAD, VG-RPDA, VG-OM3,VG-D2); b. 2011 Cath: patent grafts EF 40%, c.06/05/18 Cath: Sev native dzs, LIMA-LAD patent, VG-RPDA patent, VG-OM3 mid-graft 60% and 80%; d. 07/2022 Cath: LM nl, LAD 50p, 90p/m, D1 70, RI  30, LCX 40ost/p, 100p CTO, OM3 100, RCA 100p CTO, LIMA->LAD ok, VG->D2 ok, VG->RPDA min irregs, VG->OM3 100->Med rx.   Depression    GERD (gastroesophageal reflux disease)    Hernia of  abdominal cavity    History of 2019 novel coronavirus disease (COVID-19) 09/2019   Hyperlipidemia    Hypertension    Hypothyroidism    Iliac artery aneurysm, right (HCC)    a.) at the distal landing zone of the right iliac limb measuring 3 cm   Ischemic cardiomyopathy    Right middle lobe pulmonary nodule 06/30/2021   a.) measured 8 mm by CT on 06/30/2021.   S/P CABG x 4 06/25/2007   a.) LIMA-LAD, SVG-RPDA, SVG-OM3, SVG-D2   Sigmoid diverticulosis    Ventricular tachycardia (HCC) 05/2018   a.) found unresponsive by EMS; VT noted --> defibrillation achieved ROSC. Briefly intubated. Episode felt to be secondary to heat stoke.     Surgical History:  Past  Surgical History:  Procedure Laterality Date   ABDOMINAL AORTIC ANEURYSM REPAIR N/A 08/29/2007   Procedure: EVAR; Location: [facility]; Surgeon: Brynda Greathouse, MD   ABDOMINAL AORTIC ANEURYSM REPAIR N/A 12/05/2012   Procedure: Abdominal aortic aneurysm of approximately 6 cm in maximal diameter status post previous endovascular repair with type I and III endoleaks; Location: [facility]; Surgeon: Festus Barren, MD   CARDIAC CATHETERIZATION Left 06/28/2010   3v CAD with patent CABG grafts; LVEF 40%; stable aortic stent graft; Location: [facility]; Surgeon: Arnoldo Hooker, MD   CARDIAC CATHETERIZATION Left 06/18/2007   3v CAD; LVEF 50%; refer to CVTS for CABG; Location: [facility]; Surgeon: [name], MD   COLONOSCOPY WITH PROPOFOL N/A 11/17/2021   Procedure: COLONOSCOPY WITH PROPOFOL;  Surgeon: Toney Reil, MD;  Location: [facility] ENDOSCOPY;  Service: Gastroenterology;  Laterality: N/A;   CORONARY ARTERY BYPASS GRAFT N/A 06/25/2007   Procedure: 4v CABG (LIMA-LAD, SVG-RPDA, SVG-OM3, SVG-D2); Location: [facility]; Surgeon: Marshell Garfinkel, MD   CORONARY/GRAFT ANGIOGRAPHY N/A 06/05/2018   Procedure: Rosalie Doctor ANGIOGRAPHY;  Surgeon: Yvonne Kendall, MD;  Location: [facility] INVASIVE CV LAB;  Service: Cardiovascular;  Laterality: N/A;   ENDOVASCULAR REPAIR/STENT GRAFT N/A 08/04/2021   Procedure: ENDOVASCULAR REPAIR/STENT GRAFT;  Surgeon: Annice Needy, MD;  Location: [facility] INVASIVE CV LAB;  Service: Cardiovascular;  Laterality: N/A;   EXPLORATORY LAPAROTOMY  1997   HERNIA REPAIR     KNEE ARTHROPLASTY Left 09/19/2016   Procedure: COMPUTER ASSISTED TOTAL KNEE ARTHROPLASTY;  Surgeon: Donato [name], MD;  Location: [facility] ORS;  Service: Orthopedics;  Laterality: Left;   RIGHT/LEFT HEART CATH AND CORONARY ANGIOGRAPHY N/A 08/05/2022   Procedure: RIGHT/LEFT HEART CATH AND CORONARY ANGIOGRAPHY;  Surgeon: Iran Ouch, MD;  Location: [facility] INVASIVE CV LAB;  Service: Cardiovascular;  Laterality: N/A;   TOTAL KNEE ARTHROPLASTY Right 09/2016      Medications Prior to Admission  Medication Sig Dispense Refill Last Dose   apixaban (ELIQUIS) 5 MG TABS tablet Take 1 tablet (5 mg total) by mouth 2 (two) times daily. 180 tablet 3 05/18/2023   buPROPion (WELLBUTRIN SR) 200 MG 12 hr tablet Take 1 tablet (200 mg total) by mouth 2 (two) times daily. 180 tablet 4 05/18/2023   Cholecalciferol 50 MCG (2000 UT) CAPS Take 1 capsule by mouth daily.    05/18/2023   citalopram (CELEXA) 40 MG tablet Take 1 tablet (40 mg total) by mouth daily. 90 tablet 0 05/18/2023   ezetimibe (ZETIA) 10 MG tablet Take 1 tablet (10 mg total) by mouth daily. 90 tablet 1 05/18/2023   levothyroxine (SYNTHROID) 75 MCG tablet Take 1 tablet (75 mcg total) by mouth daily. 90 tablet 4 05/18/2023   metoprolol succinate (TOPROL XL) 25 MG 24 hr tablet Take 0.5 tablets (12.5 mg total) by mouth  daily. 90 tablet 3 05/18/2023   omeprazole (PRILOSEC) 20 MG capsule Take 1 capsule (20 mg total) by mouth daily. 90 capsule 4 05/18/2023   rosuvastatin (CRESTOR) 40 MG tablet Take 1 tablet (40 mg total) by mouth daily. 90 tablet 1 05/18/2023   sacubitril-valsartan (ENTRESTO) 24-26 MG Take 1 tablet by mouth 2 (two) times daily. (Patient not taking: Reported on 05/18/2023) 60 tablet 11 Not Taking    Inpatient Medications:   [MAR Hold] aspirin EC  81 mg Oral Daily   [MAR Hold] buPROPion ER  150 mg Oral BID   [MAR Hold] Chlorhexidine Gluconate Cloth  6 each Topical Daily   [MAR Hold] citalopram  40 mg Oral Daily   [MAR Hold] furosemide  20 mg Intravenous Daily   [MAR Hold] insulin aspart  0-5 Units Subcutaneous QHS   [MAR Hold] insulin aspart  0-9 Units Subcutaneous TID WC   [MAR Hold] levothyroxine  75 mcg Oral Q0600   [MAR Hold] midodrine  5 mg Oral TID WC   [MAR Hold] polyethylene glycol  17 g Per Tube Daily   [MAR Hold] rosuvastatin  40 mg Oral Daily    Allergies:  Allergies  Allergen Reactions   Benzodiazepines     Paradoxical agitation and delirium   Codeine Nausea Only   Tetracycline Rash     Family History  Problem Relation Age of Onset   Heart disease Mother    Heart attack Mother    Hypertension Mother    Heart attack Father    Hypertension Father    Kidney disease Brother    Depression Maternal Grandmother    Depression Maternal Grandfather    Heart disease Other    Heart attack Other    Prostate cancer Neg Hx    Kidney cancer Neg Hx    Stomach cancer Neg Hx    Colon cancer Neg Hx      Physical Exam: Vitals:   05/22/23 0801 05/22/23 0805 05/22/23 0810 05/22/23 0815  BP: 95/68 (!) 101/58 107/72 113/80  Pulse: 64 (!) 58 (!) 59 (!) 56  Resp: (!) 22 (!) 22 17 (!) 23  Temp:      TempSrc:      SpO2: 94% 95% 97% 95%  Weight:      Height:        GEN- NAD, A&O x 3, normal affect HEENT: Normocephalic, atraumatic Lungs- [place] in place, CTAB, diminished in bases, Normal effort.  Heart- irregular rate and rhythm, No M/G/R.  GI- Soft, NT, ND.  Extremities- No clubbing, cyanosis, or edema   Radiology/Studies: DG Chest Port 1 View  Result Date: 05/20/2023 CLINICAL DATA:  Acute respiratory failure and hypoxia. EXAM: PORTABLE CHEST 1 VIEW COMPARISON:  05/19/2023 FINDINGS: Status post median sternotomy and CABG procedure. Fracture of the proximal 3 sternotomy wires are again noted. Right IJ catheter is noted with tip at the superior cavoatrial junction. Cardiomediastinal contours are stable. Interval decrease in lung volumes with increased opacities in both lower lobes. No signs of interstitial edema or pleural effusion. IMPRESSION: Interval decrease in lung volumes with worsening aeration to both lower lobes. Electronically Signed   By: Signa Kell M.D.   On: 05/20/2023 07:21   ECHOCARDIOGRAM COMPLETE  Result Date: 05/19/2023    ECHOCARDIOGRAM REPORT   Patient Name:   DEONE MATHER Date of Exam: 05/19/2023 Medical Rec #:  [id number]       Height:       72.0 in Accession #:    [id number]  Weight:       208.3 lb Date of Birth:  20-Oct-1951       BSA:          2.168 m  Patient Age:    71 years        BP:           82/70 mmHg Patient Gender: M               HR:           106 bpm. Exam Location:  [facility] Procedure: 2D Echo, Cardiac Doppler and Color Doppler STAT ECHO Indications:     Cardiac Arrest I46.9  History:         Patient has prior history of Echocardiogram examinations, most                  recent 01/18/2023. CAD; Risk Factors:Hypertension.  Sonographer:     Cristela Blue Referring Phys:  [id number] KHABIB DGAYLI Diagnosing Phys: Julien Nordmann MD  Sonographer Comments: Echo performed with patient supine and on artificial respirator. IMPRESSIONS  1. Left ventricular ejection fraction, by estimation, is 40 to 45%. The left ventricle has mildly decreased function. The left ventricle demonstrates regional wall motion abnormalities (inferolateral wall hypokinesis). There is moderate asymmetric left ventricular hypertrophy of the septal segment. Left ventricular diastolic parameters are consistent with Grade I diastolic dysfunction (impaired relaxation).  2. Right ventricular systolic function is normal. The right ventricular size is normal. There is normal pulmonary artery systolic pressure. The estimated right ventricular systolic pressure is 29.0 mmHg.  3. The mitral valve is normal in structure. No evidence of mitral valve regurgitation. No evidence of mitral stenosis.  4. Tricuspid valve regurgitation is moderate.  5. The aortic valve is normal in structure. Aortic valve regurgitation is not visualized. No aortic stenosis is present.  6. There is borderline dilatation of the aortic root, measuring 40 mm.  7. The inferior vena cava is normal in size with greater than 50% respiratory variability, suggesting right atrial pressure of 3 mmHg. FINDINGS  Left Ventricle: Left ventricular ejection fraction, by estimation, is 40 to 45%. The left ventricle has mildly decreased function. The left ventricle demonstrates regional wall motion abnormalities. The left ventricular internal cavity size  was normal in size. There is moderate asymmetric left ventricular hypertrophy of the septal segment. Left ventricular diastolic parameters are consistent with Grade I diastolic dysfunction (impaired relaxation). Right Ventricle: The right ventricular size is normal. No increase in right ventricular wall thickness. Right ventricular systolic function is normal. There is normal pulmonary artery systolic pressure. The tricuspid regurgitant velocity is 2.45 m/s, and  with an assumed right atrial pressure of 5 mmHg, the estimated right ventricular systolic pressure is 29.0 mmHg. Left Atrium: Left atrial size was normal in size. Right Atrium: Right atrial size was normal in size. Pericardium: There is no evidence of pericardial effusion. Mitral Valve: The mitral valve is normal in structure. No evidence of mitral valve regurgitation. No evidence of mitral valve stenosis. Tricuspid Valve: The tricuspid valve is normal in structure. Tricuspid valve regurgitation is moderate . No evidence of tricuspid stenosis. Aortic Valve: The aortic valve is normal in structure. Aortic valve regurgitation is not visualized. No aortic stenosis is present. Aortic valve mean gradient measures 1.5 mmHg. Aortic valve peak gradient measures 3.0 mmHg. Aortic valve area, by VTI measures 3.92 cm. Pulmonic Valve: The pulmonic valve was normal in structure. Pulmonic valve regurgitation is not visualized. No evidence of pulmonic stenosis. Aorta:  The aortic root is normal in size and structure. There is borderline dilatation of the aortic root, measuring 40 mm. Venous: The inferior vena cava is normal in size with greater than 50% respiratory variability, suggesting right atrial pressure of 3 mmHg. IAS/Shunts: No atrial level shunt detected by color flow Doppler.  LEFT VENTRICLE PLAX 2D LVIDd:         5.20 cm     Diastology LVIDs:         4.40 cm     LV e' medial:    7.94 cm/s LV PW:         0.90 cm     LV E/e' medial:  5.4 LV IVS:        1.80 cm      LV e' lateral:   12.50 cm/s LVOT diam:     2.30 cm     LV E/e' lateral: 3.4 LV SV:         37 LV SV Index:   17 LVOT Area:     4.15 cm  LV Volumes (MOD) LV vol d, MOD A2C: 66.3 ml LV vol d, MOD A4C: 87.7 ml LV vol s, MOD A2C: 33.3 ml LV vol s, MOD A4C: 43.1 ml LV SV MOD A2C:     33.0 ml LV SV MOD A4C:     87.7 ml LV SV MOD BP:      41.3 ml RIGHT VENTRICLE RV Basal diam:  3.80 cm RV Mid diam:    2.60 cm RV S prime:     10.90 cm/s TAPSE (M-mode): 2.3 cm LEFT ATRIUM           Index        RIGHT ATRIUM           Index LA diam:      1.80 cm 0.83 cm/m   RA Area:     23.60 cm LA Vol (A2C): 10.2 ml 4.71 ml/m   RA Volume:   74.10 ml  34.18 ml/m LA Vol (A4C): 45.4 ml 20.94 ml/m  AORTIC VALVE AV Area (Vmax):    3.22 cm AV Area (Vmean):   3.10 cm AV Area (VTI):     3.92 cm AV Vmax:           86.50 cm/s AV Vmean:          62.900 cm/s AV VTI:            0.094 m AV Peak Grad:      3.0 mmHg AV Mean Grad:      1.5 mmHg LVOT Vmax:         67.10 cm/s LVOT Vmean:        47.000 cm/s LVOT VTI:          0.088 m LVOT/AV VTI ratio: 0.94  AORTA Ao Root diam: 3.96 cm MITRAL VALVE               TRICUSPID VALVE MV Area (PHT): 6.83 cm    TR Peak grad:   24.0 mmHg MV Decel Time: 111 msec    TR Vmax:        245.00 cm/s MV E velocity: 43.00 cm/s MV A velocity: 63.40 cm/s  SHUNTS MV E/A ratio:  0.68        Systemic VTI:  0.09 m                            Systemic Diam: 2.30 cm  Julien Nordmann MD Electronically signed by Julien Nordmann MD Signature Date/Time: 05/19/2023/12:45:29 PM    Final    DG Chest Port 1 View  Result Date: 05/19/2023 CLINICAL DATA:  Acute respiratory failure with hypoxia. Status post cardiac arrest. EXAM: PORTABLE CHEST 1 VIEW COMPARISON:  05/18/2023 FINDINGS: There is a right IJ catheter with tip in the projection of the cavoatrial junction. Interval extubation. Signs of previous median sternotomy and CABG procedure. As noted previously there is fracture of the proximal 3 sternotomy wires. Cardiomediastinal contours  are unremarkable. Trace interstitial edema with thickening of the peripheral septal lines in the left lung. No pleural effusion or airspace consolidation. IMPRESSION: 1. Interval extubation. 2. Trace interstitial edema. Electronically Signed   By: Signa Kell M.D.   On: 05/19/2023 12:30   DG Chest Port 1 View  Result Date: 05/18/2023 CLINICAL DATA:  Central line EXAM: PORTABLE CHEST 1 VIEW COMPARISON:  05/18/2023 FINDINGS: Endotracheal tube tip is about 3 cm superior to carina. Esophageal tube tip below the diaphragm but incompletely visualized. Post sternotomy changes. Right IJ central venous catheter tip over the SVC. No visible pneumothorax. Stable cardiomediastinal silhouette. Patchy atelectasis left base. IMPRESSION: Right IJ central venous catheter tip over the SVC. No visible pneumothorax. Electronically Signed   By: Jasmine Pang M.D.   On: 05/18/2023 22:45   CT Angio Chest/Abd/Pel for Dissection W and/or Wo Contrast  Result Date: 05/18/2023 CLINICAL DATA:  Post cardiac arrest EXAM: CT ANGIOGRAPHY CHEST, ABDOMEN AND PELVIS TECHNIQUE: Non-contrast CT of the chest was initially obtained. Multidetector CT imaging through the chest, abdomen and pelvis was performed using the standard protocol during bolus administration of intravenous contrast. Multiplanar reconstructed images and MIPs were obtained and reviewed to evaluate the vascular anatomy. RADIATION DOSE REDUCTION: This exam was performed according to the departmental dose-optimization program which includes automated exposure control, adjustment of the mA and/or kV according to patient size and/or use of iterative reconstruction technique. CONTRAST:  OMNIPAQUE IOHEXOL 350 MG/ML SOLN COMPARISON:  None Available. FINDINGS: CTA CHEST FINDINGS VASCULAR Aorta: Satisfactory opacification of the aorta. Normal contour and caliber of the thoracic aorta. No evidence of aneurysm, dissection, or other acute aortic pathology. Moderate mixed calcific  atherosclerosis Cardiovascular: No evidence of pulmonary embolism on limited non-tailored examination. Cardiomegaly. Three-vessel coronary artery calcifications status post median sternotomy and CABG. No pericardial effusion. Review of the MIP images confirms the above findings. NON VASCULAR Mediastinum/Nodes: No enlarged mediastinal, hilar, or axillary lymph nodes. Thyroid gland, trachea, and esophagus demonstrate no significant findings. Lungs/Pleura: Endotracheal intubation, tip above the carina. Dependent bibasilar scarring or atelectasis. No pleural effusion or pneumothorax. Musculoskeletal: No chest wall abnormality. Numerous nondisplaced fractures of the anterior ribs (series 6, image 61). Review of the MIP images confirms the above findings. CTA ABDOMEN AND PELVIS FINDINGS VASCULAR Status post aortobiiliac stent endograft repair of abdominal aortic aneurysm. No acute findings. Moderate mixed calcific atherosclerosis. Standard branching pattern of the abdominal aorta with solitary bilateral renal arteries. Review of the MIP images confirms the above findings. NON-VASCULAR Hepatobiliary: No solid liver abnormality is seen. No gallstones, gallbladder wall thickening, or biliary dilatation. Pancreas: Unremarkable. No pancreatic ductal dilatation or surrounding inflammatory changes. Spleen: Normal in size without significant abnormality. Adrenals/Urinary Tract: Adrenal glands are unremarkable. Numerous bilateral renal cortical cysts, more numerous on the left, without obvious solid mass or suspicious contrast enhancement. No hydronephrosis. Kidneys are otherwise normal, without renal calculi, solid lesion, or hydronephrosis. Bladder is unremarkable. Stomach/Bowel: Esophagogastric tube with tip and side port below  the diaphragm. Stomach is within normal limits. Appendix appears normal. No evidence of bowel wall thickening, distention, or inflammatory changes. Sigmoid diverticulosis Lymphatic: No enlarged abdominal  or pelvic lymph nodes. Reproductive: No mass or other significant abnormality. Other: No abdominal wall hernia or abnormality. No ascites. Musculoskeletal: No acute osseous findings. IMPRESSION: 1. No evidence of thoracic aortic aneurysm, dissection, or other acute aortic pathology. 2. Status post aortobiiliac stent endograft repair of the abdominal aorta without acute findings. 3. No evidence of pulmonary embolism on limited non-tailored examination. 4. Numerous nondisplaced fractures of the anterior ribs. No pneumothorax. 5. Cardiomegaly and coronary artery disease status post median sternotomy and CABG. 6. Sigmoid diverticulosis without evidence of acute diverticulitis. Aortic Atherosclerosis (ICD10-I70.0). Electronically Signed   By: Jearld Lesch M.D.   On: 05/18/2023 17:46   CT Head Wo Contrast  Result Date: 05/18/2023 CLINICAL DATA:  Cardiac arrest. EXAM: CT HEAD WITHOUT CONTRAST TECHNIQUE: Contiguous axial images were obtained from the base of the skull through the vertex without intravenous contrast. RADIATION DOSE REDUCTION: This exam was performed according to the departmental dose-optimization program which includes automated exposure control, adjustment of the mA and/or kV according to patient size and/or use of iterative reconstruction technique. COMPARISON:  MRI brain 11/23/2022 FINDINGS: Brain: There is no evidence for acute hemorrhage, hydrocephalus, mass lesion, or abnormal extra-axial fluid collection. No definite CT evidence for acute infarction. Vascular: No hyperdense vessel or unexpected calcification. Skull: No evidence for fracture. No worrisome lytic or sclerotic lesion. Sinuses/Orbits: The visualized paranasal sinuses and mastoid air cells are clear. Visualized portions of the globes and intraorbital fat are unremarkable. Other: Gas is identified in venous anatomy of the lower head bilaterally, likely reflecting IV access. IMPRESSION: 1. No acute intracranial abnormality. 2. Gas in  superficial venous anatomy of the lower head bilaterally, likely reflecting IV access. Electronically Signed   By: Kennith Center M.D.   On: 05/18/2023 17:35   DG Chest Port 1 View  Result Date: 05/18/2023 CLINICAL DATA:  Intubation EXAM: PORTABLE CHEST 1 VIEW COMPARISON:  06/03/2018 FINDINGS: Endotracheal tube with the tip 6.6 cm above the carina. Nasogastric tube with the tip projecting over the stomach. No focal consolidation. No pleural effusion or pneumothorax. Heart and mediastinal contours are unremarkable. Prior CABG. No acute osseous abnormality. IMPRESSION: 1. Endotracheal tube with the tip 6.6 cm above the carina. Electronically Signed   By: Elige Ko M.D.   On: 05/18/2023 17:11    EKG: 7/8 at 0648: AFib, rate 96; RBBB  (personally reviewed)  TELEMETRY: Aflutter prior to this morning, NSR since DCCV (personally reviewed)   Assessment/Plan: #) PEA arrest #) ?VT #) AFib w RVR Patient's initial EKG suspicious for VT vs Afib w RVR - wide complex, irregular tachycardia Has h/o VT per EMS run sheet 05/2018 Gen cardiology has already initiated amiodarone gtt  Patient is s/p DCCV this AM with return to NSR Hep gtt > eliquis 5mg  BID, appropriately dosed (home med)  Will discuss arrhythmia further with colleagues Dr. Graciela Husbands to see tomorrow       For questions or updates, please contact [facility] Please consult [email or web address] for contact info under Cardiology/STEMI.  Signed, Sherie Don, NP  05/22/2023 8:27 AM

## 2023-05-22 NOTE — Transfer of Care (Signed)
Immediate Anesthesia Transfer of Care Note  Patient: Stuart Harvey  Procedure(s) Performed: CARDIOVERSION  Patient Location: PACU and Nursing Unit  Anesthesia Type:General  Level of Consciousness: awake, alert , and oriented  Airway & Oxygen Therapy: Patient Spontanous Breathing and Patient connected to nasal cannula oxygen  Post-op Assessment: Report given to RN and Post -op Vital signs reviewed and stable  Post vital signs: Reviewed and stable  Last Vitals:  Vitals Value Taken Time  BP 95/68 05/22/23 0801  Temp    Pulse 61 05/22/23 0803  Resp 18 05/22/23 0803  SpO2 95 % 05/22/23 0803  Vitals shown include unvalidated device data.  Last Pain:  Vitals:   05/22/23 0715  TempSrc: Oral  PainSc: 8       Patients Stated Pain Goal: 0 (05/22/23 0715)  Complications: No notable events documented.

## 2023-05-22 NOTE — Anesthesia Postprocedure Evaluation (Signed)
Anesthesia Post Note  Patient: Stuart Harvey  Procedure(s) Performed: CARDIOVERSION  Patient location during evaluation: Specials Recovery Anesthesia Type: General Level of consciousness: awake and alert Pain management: pain level controlled Vital Signs Assessment: post-procedure vital signs reviewed and stable Respiratory status: spontaneous breathing, nonlabored ventilation and respiratory function stable Cardiovascular status: blood pressure returned to baseline and stable Postop Assessment: no apparent nausea or vomiting Anesthetic complications: no   No notable events documented.   Last Vitals:  Vitals:   05/22/23 0815 05/22/23 0833  BP: 113/80 122/86  Pulse: (!) 56 63  Resp: (!) 23 17  Temp:    SpO2: 95% 94%    Last Pain:  Vitals:   05/22/23 0833  TempSrc:   PainSc: 0-No pain                 Foye Deer

## 2023-05-22 NOTE — Inpatient Diabetes Management (Signed)
Inpatient Diabetes Program Recommendations  AACE/ADA: New Consensus Statement on Inpatient Glycemic Control (2015)  Target Ranges:  Prepandial:   less than 140 mg/dL      Peak postprandial:   less than 180 mg/dL (1-2 hours)      Critically ill patients:  140 - 180 mg/dL    Latest Reference Range & Units 05/21/23 07:44 05/21/23 11:25 05/21/23 11:26 05/21/23 16:34 05/21/23 16:36 05/21/23 22:16  Glucose-Capillary 70 - 99 mg/dL 98 57 (L) 932 (H)  1 unit Novolog  68 (L) 111 (H) 111 (H)  (L): Data is abnormally low (H): Data is abnormally high   Admit from home following Cardiac Arrest  No History of Diabetes noted  Current Orders: Novolog Sensitive Correction Scale/ SSI (0-9 units) TID AC + HS    MD- Note Hypoglycemia yesterday at 11am and again at 4pm  No History of diabetes  Please consider stopping Novolog SSI for now but continue CBG checks TID AC + HS    --Will follow patient during hospitalization--  Ambrose Finland RN, MSN, CDCES Diabetes Coordinator Inpatient Glycemic Control Team Team Pager: [phone] (8a-5p)

## 2023-05-22 NOTE — Progress Notes (Signed)
Cardiology Progress Note   Patient Name: Stuart Harvey Date of Encounter: 05/22/2023  Primary Cardiologist: [name], MD  Subjective   He underwent successful cardioversion this morning.  He is maintaining sinus rhythm.  He has musculoskeletal chest pain from CPR.  Inpatient Medications    Scheduled Meds:  [MAR Hold] aspirin EC  81 mg Oral Daily   [MAR Hold] buPROPion ER  150 mg Oral BID   [MAR Hold] Chlorhexidine Gluconate Cloth  6 each Topical Daily   [MAR Hold] citalopram  40 mg Oral Daily   [MAR Hold] furosemide  20 mg Intravenous Daily   [MAR Hold] insulin aspart  0-5 Units Subcutaneous QHS   [MAR Hold] insulin aspart  0-9 Units Subcutaneous TID WC   [MAR Hold] levothyroxine  75 mcg Oral Q0600   [MAR Hold] midodrine  5 mg Oral TID WC   [MAR Hold] polyethylene glycol  17 g Per Tube Daily   [MAR Hold] rosuvastatin  40 mg Oral Daily   Continuous Infusions:  sodium chloride Stopped (05/18/23 2300)   sodium chloride 20 mL/hr at 05/22/23 0748   amiodarone 60 mg/hr (05/22/23 0748)   heparin 900 Units/hr (05/22/23 0748)   PRN Meds: [MAR Hold] docusate sodium, [MAR Hold] ipratropium-albuterol, [MAR Hold] oxyCODONE, [MAR Hold] polyethylene glycol, [MAR Hold] traZODone   Vital Signs    Vitals:   05/22/23 0805 05/22/23 0810 05/22/23 0815 05/22/23 0833  BP: (!) 101/58 107/72 113/80 122/86  Pulse: (!) 58 (!) 59 (!) 56 63  Resp: (!) 22 17 (!) 23 17  Temp:      TempSrc:      SpO2: 95% 97% 95% (!) 84%  Weight:      Height:        Intake/Output Summary (Last 24 hours) at 05/22/2023 0843 Last data filed at 05/22/2023 0758 Gross per 24 hour  Intake 530.02 ml  Output 1825 ml  Net -1294.98 ml    Filed Weights   05/20/23 0500 05/21/23 0122 05/22/23 0500  Weight: 94.5 kg 95.2 kg 95.4 kg    Physical Exam   GEN: Well nourished, well developed, in no acute distress.  HEENT: Grossly normal.  Neck: Supple, no JVD, carotid bruits, or masses. Cardiac: Regular rate and  rhythm, no murmurs, rubs, or gallops. No clubbing, cyanosis, edema.  Radials 2+, DP/PT 2+ and equal bilaterally.  Respiratory:  Respirations regular and unlabored, diminished breath sounds bilat. GI: Soft, nontender, nondistended, BS + x 4. MS: no deformity or atrophy. Skin: warm and dry, no rash. Neuro:  Strength and sensation are intact. Psych: AAOx3.  Normal affect.  Labs    Chemistry Recent Labs  Lab 05/18/23 1627 05/18/23 2053 05/20/23 0535 05/20/23 1509 05/21/23 0340  NA 139   < > 137 135 136  K 5.7*   < > 3.5 3.4* 3.7  CL 112*   < > 105 104 104  CO2 10*   < > 23 24 25   GLUCOSE 160*   < > 108* 136* 109*  BUN 37*   < > 26* 24* 22  CREATININE 2.01*   < > 1.57* 1.48* 1.39*  CALCIUM 9.7   < > 8.4* 8.0* 8.3*  PROT 7.5  --   --   --   --   ALBUMIN 4.3  --   --   --   --   AST 43*  --   --   --   --   ALT 20  --   --   --   --  ALKPHOS 103  --   --   --   --   BILITOT 1.0  --   --   --   --   GFRNONAA 35*   < > 47* 50* 54*  ANIONGAP 17*   < > 9 7 7    < > = values in this interval not displayed.      Hematology Recent Labs  Lab 05/20/23 0535 05/21/23 0340 05/22/23 0455  WBC 13.1* 10.5 10.4  RBC 4.09* 3.96* 3.98*  HGB 12.3* 11.9* 12.0*  HCT 38.3* 37.0* 36.6*  MCV 93.6 93.4 92.0  MCH 30.1 30.1 30.2  MCHC 32.1 32.2 32.8  RDW 14.8 14.6 14.3  PLT 113* 114* 126*     Cardiac Enzymes  Recent Labs  Lab 05/18/23 1627 05/18/23 1820 05/18/23 2247 05/19/23 0211  TROPONINIHS 21* 40* 101* 116*       BNP    Component Value Date/Time   BNP 513.5 (H) 05/18/2023 1627   Lipids  Lab Results  Component Value Date   CHOL 146 01/11/2023   HDL 52 01/11/2023   LDLCALC 76 01/11/2023   TRIG 120 05/19/2023   CHOLHDL 2.9 03/29/2022    HbA1c  Lab Results  Component Value Date   HGBA1C 5.7 (H) 05/20/2023    Radiology    DG Chest Port 1 View  Result Date: 05/20/2023 CLINICAL DATA:  Acute respiratory failure and hypoxia. EXAM: PORTABLE CHEST 1 VIEW COMPARISON:   05/19/2023 FINDINGS: Status post median sternotomy and CABG procedure. Fracture of the proximal 3 sternotomy wires are again noted. Right IJ catheter is noted with tip at the superior cavoatrial junction. Cardiomediastinal contours are stable. Interval decrease in lung volumes with increased opacities in both lower lobes. No signs of interstitial edema or pleural effusion. IMPRESSION: Interval decrease in lung volumes with worsening aeration to both lower lobes. Electronically Signed   By: Signa Kell M.D.   On: 05/20/2023 07:21   CT Angio Chest/Abd/Pel for Dissection W and/or Wo Contrast  Result Date: 05/18/2023 CLINICAL DATA:  Post cardiac arrest EXAM: CT ANGIOGRAPHY CHEST, ABDOMEN AND PELVIS TECHNIQUE: Non-contrast CT of the chest was initially obtained. Multidetector CT imaging through the chest, abdomen and pelvis was performed using the standard protocol during bolus administration of intravenous contrast. Multiplanar reconstructed images and MIPs were obtained and reviewed to evaluate the vascular anatomy. RADIATION DOSE REDUCTION: This exam was performed according to the departmental dose-optimization program which includes automated exposure control, adjustment of the mA and/or kV according to patient size and/or use of iterative reconstruction technique. CONTRAST:  OMNIPAQUE IOHEXOL 350 MG/ML SOLN COMPARISON:  None Available. FINDINGS: CTA CHEST FINDINGS VASCULAR Aorta: Satisfactory opacification of the aorta. Normal contour and caliber of the thoracic aorta. No evidence of aneurysm, dissection, or other acute aortic pathology. Moderate mixed calcific atherosclerosis Cardiovascular: No evidence of pulmonary embolism on limited non-tailored examination. Cardiomegaly. Three-vessel coronary artery calcifications status post median sternotomy and CABG. No pericardial effusion. Review of the MIP images confirms the above findings. NON VASCULAR Mediastinum/Nodes: No enlarged mediastinal, hilar, or  axillary lymph nodes. Thyroid gland, trachea, and esophagus demonstrate no significant findings. Lungs/Pleura: Endotracheal intubation, tip above the carina. Dependent bibasilar scarring or atelectasis. No pleural effusion or pneumothorax. Musculoskeletal: No chest wall abnormality. Numerous nondisplaced fractures of the anterior ribs (series 6, image 61). Review of the MIP images confirms the above findings. CTA ABDOMEN AND PELVIS FINDINGS VASCULAR Status post aortobiiliac stent endograft repair of abdominal aortic aneurysm. No acute findings. Moderate mixed  calcific atherosclerosis. Standard branching pattern of the abdominal aorta with solitary bilateral renal arteries. Review of the MIP images confirms the above findings. NON-VASCULAR Hepatobiliary: No solid liver abnormality is seen. No gallstones, gallbladder wall thickening, or biliary dilatation. Pancreas: Unremarkable. No pancreatic ductal dilatation or surrounding inflammatory changes. Spleen: Normal in size without significant abnormality. Adrenals/Urinary Tract: Adrenal glands are unremarkable. Numerous bilateral renal cortical cysts, more numerous on the left, without obvious solid mass or suspicious contrast enhancement. No hydronephrosis. Kidneys are otherwise normal, without renal calculi, solid lesion, or hydronephrosis. Bladder is unremarkable. Stomach/Bowel: Esophagogastric tube with tip and side port below the diaphragm. Stomach is within normal limits. Appendix appears normal. No evidence of bowel wall thickening, distention, or inflammatory changes. Sigmoid diverticulosis Lymphatic: No enlarged abdominal or pelvic lymph nodes. Reproductive: No mass or other significant abnormality. Other: No abdominal wall hernia or abnormality. No ascites. Musculoskeletal: No acute osseous findings. IMPRESSION: 1. No evidence of thoracic aortic aneurysm, dissection, or other acute aortic pathology. 2. Status post aortobiiliac stent endograft repair of the  abdominal aorta without acute findings. 3. No evidence of pulmonary embolism on limited non-tailored examination. 4. Numerous nondisplaced fractures of the anterior ribs. No pneumothorax. 5. Cardiomegaly and coronary artery disease status post median sternotomy and CABG. 6. Sigmoid diverticulosis without evidence of acute diverticulitis. Aortic Atherosclerosis (ICD10-I70.0). Electronically Signed   By: Jearld Lesch M.D.   On: 05/18/2023 17:46   CT Head Wo Contrast  Result Date: 05/18/2023 CLINICAL DATA:  Cardiac arrest. EXAM: CT HEAD WITHOUT CONTRAST TECHNIQUE: Contiguous axial images were obtained from the base of the skull through the vertex without intravenous contrast. RADIATION DOSE REDUCTION: This exam was performed according to the departmental dose-optimization program which includes automated exposure control, adjustment of the mA and/or kV according to patient size and/or use of iterative reconstruction technique. COMPARISON:  MRI brain 11/23/2022 FINDINGS: Brain: There is no evidence for acute hemorrhage, hydrocephalus, mass lesion, or abnormal extra-axial fluid collection. No definite CT evidence for acute infarction. Vascular: No hyperdense vessel or unexpected calcification. Skull: No evidence for fracture. No worrisome lytic or sclerotic lesion. Sinuses/Orbits: The visualized paranasal sinuses and mastoid air cells are clear. Visualized portions of the globes and intraorbital fat are unremarkable. Other: Gas is identified in venous anatomy of the lower head bilaterally, likely reflecting IV access. IMPRESSION: 1. No acute intracranial abnormality. 2. Gas in superficial venous anatomy of the lower head bilaterally, likely reflecting IV access. Electronically Signed   By: Kennith Center M.D.   On: 05/18/2023 17:35   Telemetry    Aflutter, 80's to low 100's - Personally Reviewed  Cardiac Studies   2D Echocardiogram 7.5.2024   1. Left ventricular ejection fraction, by estimation, is 40 to  45%. The  left ventricle has mildly decreased function. The left ventricle  demonstrates regional wall motion abnormalities (inferolateral wall  hypokinesis). There is moderate asymmetric left  ventricular hypertrophy of the septal segment. Left ventricular diastolic  parameters are consistent with Grade I diastolic dysfunction (impaired  relaxation).   2. Right ventricular systolic function is normal. The right ventricular  size is normal. There is normal pulmonary artery systolic pressure. The  estimated right ventricular systolic pressure is 29.0 mmHg.   3. The mitral valve is normal in structure. No evidence of mitral valve  regurgitation. No evidence of mitral stenosis.   4. Tricuspid valve regurgitation is moderate.   5. The aortic valve is normal in structure. Aortic valve regurgitation is  not visualized. No aortic stenosis  is present.   6. There is borderline dilatation of the aortic root, measuring 40 mm.   7. The inferior vena cava is normal in size with greater than 50%  respiratory variability, suggesting right atrial pressure of 3 mmHg.  _____________   Patient Profile     FODE GULINO is a 72 y.o. male with a history of CAD s/p CABG x 4 in 2008 ([facility]), chronic HFrEF/ICM, PAF on eliquis, HTN, HL, infrarenal AAA s/p open repair 08/2007), R iliac artery aneurysm s/p EVAR 07/2021, hypothyroidism, anemia, depression, and GERD, admitted 7/4 following witnessed arrest @ home.   Assessment & Plan     1.  Cardiac arrest:  pt with witnessed loss of consciousness by wife w/ immediate bystander CPR.  Per ED notes, EMS reported that pt was pulseless upon their arrival but ROSC achieved after 4 additional mins of CPR.  Did not require ACLS medications.  Afib w/ RVR upon arrival to ED.  HsTrops mildly elevated 21   40   101   116.  Echo 7/5 w/ sl improvement in EF to 40% w/ inferolateral HK.  Pt recalls some events prior to syncope - was in garage, felt hot, sob, no c/p or palpitations.     EP to see today but I doubt that he will require an ICD placement given that his ejection fraction is greater than 35% and no evidence of ventricular arrhythmia so far.  The patient did have a similar presentation in the setting of a heat stroke in 2019. He might benefit from a loop recorder.   2.  Afib w/ RVR/Atrial Flutter:  pt presented in Afib @ 180.  Loaded w/ amio on 7/5.   He underwent successful cardioversion this morning.  Will switch him to Eliquis and discontinue IV amiodarone. Given that his atrial arrhythmia episodes are not very frequent, I do not think he requires an antiarrhythmic medication at this time.       3.  ICM/HFrEF:  EF 40% by echo 7/5.  Received IV lasix past 2 days.  CXR 7/6 w/ worsening aeration (also being tx for asp pna).  He received IV furosemide.  He appears to be euvolemic now and will discontinue diuresis. I will resume small dose Toprol.  Will likely resume small dose Entresto tomorrow.   4.  CAD/Demand Ischemia:   s/p prior CABG w/ 3/4 patent grafts and known multivessel dzs on cath in 07/2022.  Denies any recent c/p.  HsTrop mildly elevated 21   40   101   116.  As above, Echo w/ EF of 40%, inflat HK.  Defer ischemic eval @ this time.  Cont ASA and statin.    5.  Hyperlipidemia: Continue rosuvastatin.   7.  Acute resp failure/Possible aspiration PNA: Was requiring supplemental oxygen which was gradually weaned down.  The patient can be transferred to telemetry today and possible discharge home tomorrow if he remains stable.  Total encounter time more than 50 minutes. Greater than 50% was spent in counseling and coordination of care with the patient.   Signed, [name], MD  05/22/2023, 8:43 AM    For questions or updates, please contact   Please consult [email or web address] for contact info under Cardiology/STEMI.

## 2023-05-22 NOTE — Progress Notes (Addendum)
Progress Note    Stuart Harvey  WUJ:[id number] DOB: Sep 13, 1951  DOA: 05/18/2023 PCP: Marjie Skiff, NP      Brief Narrative:    Medical records reviewed and are as summarized below:  Stuart Harvey is a 72 y.o. male with past medical history significant for CAD status post CABG, HFrEF, AAA status post surgical repair in 2008, right iliac artery aneurysm status post endovascular repair in 2022, atrial fibrillation on Eliquis, CKD, hypertension, hyperlipidemia, hypothyroidism, anemia, GERD, and depression remote history of tobacco use disorder, COPD.  He was brought to the hospital because of altered mental status after cardiac arrest.  Reportedly, patient had been working in the garage and he called out for help.  His wife found him sitting on the stool leaning forward.  Subsequently, he lost consciousness and collapsed.  He was noted to be cyanotic.  When EMS arrived patient was pulseless so CPR was initiated.  ROSC was achieved after about 4 minutes of CPR.      Assessment/Plan:   Principal Problem:   Cardiac arrest (HCC) Active Problems:   CKD (chronic kidney disease) stage 3, GFR 30-59 ml/min (HCC)   Heart failure with reduced ejection fraction (HCC)   Acute respiratory failure (HCC)   Altered mental status   Hypotension   Typical atrial flutter (HCC)   Paroxysmal atrial fibrillation with RVR (HCC)   AKI (acute kidney injury) (HCC)   Hyperkalemia   Hypokalemia   Nutrition Problem: Inadequate oral intake Etiology: inability to eat (pt sedated and ventilated)  Signs/Symptoms: NPO status   Body mass index is 28.52 kg/m.   S/p cardiac arrest probably secondary to arrhythmia, history of ischemic cardiomyopathy EF of 40 to 45% Continue Toprol-XL    Atrial fibrillation/flutter with RVR Completed treatment with IV amiodarone and heparin drip.       S/p cardioversion on 05/22/2023  Restarted on Eliquis.   Acute hypoxic respiratory failure post cardiac  arrest S/p extubation on 05/19/2023. Continue 3 L/min oxygen via [place] and wean off oxygen as able.   Acute on chronic CHF systolic BNP was 956 on admission.  S/p treatment with IV Lasix.    Musculoskeletal chest pain due to CPR Oxycodone and Tylenol as needed for pain.    AKI on CKD-IIIA Hyperkalemia, hypokalemia Creatinine has improved.  Hyperkalemia has resolved. Replete potassium.    History of CAD status post CABG in 2008 continue aspirin, statin   Remote history of tobacco use disorder He smoked for over 30 years but quit smoking about 18 years ago. He said he has been told he has COPD.  Use bronchodilators as needed.   Thrombocytopenia Improving   Leukocytosis Resolved.  This was likely reactive.   Rayfield Citizen, pharmacist, recommended reducing citalopram from 40 to 20 mg daily to reduce risk of QTc prolongation   Other comorbidities include hypertension, hyperlipidemia, hypothyroidism, depression, GERD, arthritis   Transfer from stepdown unit to progressive cardiac unit.  Plan of care was discussed with the patient, his wife and son at the bedside.  Diet Order             Diet Heart Room service appropriate? Yes; Fluid consistency: Thin  Diet effective now                            Consultants: Cardiologist Intensivist  Procedures: Cardioversion on 05/22/2023    Medications:    apixaban  5 mg Oral BID  aspirin EC  81 mg Oral Daily   buPROPion ER  150 mg Oral BID   Chlorhexidine Gluconate Cloth  6 each Topical Daily   [START ON 05/23/2023] citalopram  20 mg Oral Daily   insulin aspart  0-5 Units Subcutaneous QHS   insulin aspart  0-9 Units Subcutaneous TID WC   levothyroxine  75 mcg Oral Q0600   metoprolol succinate  12.5 mg Oral Daily   polyethylene glycol  17 g Per Tube Daily   potassium chloride  40 mEq Oral Once   rosuvastatin  40 mg Oral Daily   Continuous Infusions:  sodium chloride Stopped (05/18/23 2300)      Anti-infectives (From admission, onward)    Start     Dose/Rate Route Frequency Ordered Stop   05/18/23 2000  Ampicillin-Sulbactam (UNASYN) 3 g in sodium chloride 0.9 % 100 mL IVPB        3 g 200 mL/hr over 30 Minutes Intravenous Every 6 hours 05/18/23 1840 05/20/23 1515              Family Communication/Anticipated D/C date and plan/Code Status   DVT prophylaxis: SCDs Start: 05/18/23 1810 apixaban (ELIQUIS) tablet 5 mg     Code Status: Full Code  Family Communication: Discussed with his wife and son at the bedside Disposition Plan: Plan to discharge home in 1 to 2 days   Status is: Inpatient Remains inpatient appropriate because: S/p cardioversion for A-fib, s/p cardiac arrest       Subjective:   Interval events noted.  He complains of chest pain from recent CPR.  He also complains of cough.  No shortness of breath.  His wife and son were at the bedside  Objective:    Vitals:   05/22/23 1400 05/22/23 1500 05/22/23 1526 05/22/23 1528  BP: (!) 146/82 118/71  125/85  Pulse: 68 66 76 69  Resp: 17 (!) 28 (!) 22 18  Temp:   98.1 F (36.7 C)   TempSrc:   Oral   SpO2: 100% 97% 92% 92%  Weight:      Height:       No data found.   Intake/Output Summary (Last 24 hours) at 05/22/2023 1554 Last data filed at 05/22/2023 1526 Gross per 24 hour  Intake 290 ml  Output 1950 ml  Net -1660 ml   Filed Weights   05/20/23 0500 05/21/23 0122 05/22/23 0500  Weight: 94.5 kg 95.2 kg 95.4 kg    Exam:  GEN: NAD SKIN: No rash EYES: No pallor or icterus ENT: MMM CV: RRR PULM: B/l wheezing, no rales heard ABD: soft, ND, NT, +BS CNS: AAO x 3, non focal EXT: No edema or tenderness MSK: Mild anterior/sternal chest wall tenderness      Data Reviewed:   I have personally reviewed following labs and imaging studies:  Labs: Labs show the following:   Basic Metabolic Panel: Recent Labs  Lab 05/18/23 2247 05/19/23 0444 05/20/23 0535 05/20/23 1509  05/21/23 0340 05/22/23 1230  NA  --  141 137 135 136 134*  K  --  4.7 3.5 3.4* 3.7 3.4*  CL  --  113* 105 104 104 101  CO2  --  22 23 24 25 24   GLUCOSE  --  101* 108* 136* 109* 112*  BUN  --  34* 26* 24* 22 16  CREATININE  --  1.65* 1.57* 1.48* 1.39* 1.17  CALCIUM  --  8.5* 8.4* 8.0* 8.3* 8.5*  MG 2.4 2.4 2.0 2.0 2.1  --  PHOS 4.1 4.4  --   --   --   --    GFR Estimated Creatinine Clearance: 69.4 mL/min (by C-G formula based on SCr of 1.17 mg/dL). Liver Function Tests: Recent Labs  Lab 05/18/23 1627  AST 43*  ALT 20  ALKPHOS 103  BILITOT 1.0  PROT 7.5  ALBUMIN 4.3   No results for input(s): "LIPASE", "AMYLASE" in the last 168 hours. No results for input(s): "AMMONIA" in the last 168 hours. Coagulation profile Recent Labs  Lab 05/18/23 1627 05/18/23 2053  INR 5.4* 1.3*    CBC: Recent Labs  Lab 05/18/23 1627 05/19/23 0444 05/20/23 0535 05/21/23 0340 05/22/23 0455  WBC 18.2* 14.6* 13.1* 10.5 10.4  NEUTROABS 5.9  --   --   --   --   HGB 15.5 13.2 12.3* 11.9* 12.0*  HCT 48.3 41.4 38.3* 37.0* 36.6*  MCV 94.2 94.3 93.6 93.4 92.0  PLT 201 179 113* 114* 126*   Cardiac Enzymes: No results for input(s): "CKTOTAL", "CKMB", "CKMBINDEX", "TROPONINI" in the last 168 hours. BNP (last 3 results) No results for input(s): "PROBNP" in the last 8760 hours. CBG: Recent Labs  Lab 05/21/23 1634 05/21/23 1636 05/21/23 2216 05/22/23 0851 05/22/23 1135  GLUCAP 68* 111* 111* 94 114*   D-Dimer: No results for input(s): "DDIMER" in the last 72 hours. Hgb A1c: Recent Labs    05/20/23 0535  HGBA1C 5.7*   Lipid Profile: No results for input(s): "CHOL", "HDL", "LDLCALC", "TRIG", "CHOLHDL", "LDLDIRECT" in the last 72 hours. Thyroid function studies: No results for input(s): "TSH", "T4TOTAL", "T3FREE", "THYROIDAB" in the last 72 hours.  Invalid input(s): "FREET3" Anemia work up: No results for input(s): "VITAMINB12", "FOLATE", "FERRITIN", "TIBC", "IRON", "RETICCTPCT" in  the last 72 hours. Sepsis Labs: Recent Labs  Lab 05/18/23 1627 05/18/23 1820 05/19/23 0444 05/19/23 1759 05/20/23 0535 05/21/23 0340 05/22/23 0455  PROCALCITON  --   --   --  <0.10  --   --   --   WBC 18.2*  --  14.6*  --  13.1* 10.5 10.4  LATICACIDVEN >9.0* 1.9  --   --   --   --   --     Microbiology Recent Results (from the past 240 hour(s))  SARS Coronavirus 2 by RT PCR (hospital order, performed in [facility] hospital lab) *cepheid single result test* Anterior Nasal Swab     Status: None   Collection Time: 05/18/23  4:31 PM   Specimen: Anterior Nasal Swab  Result Value Ref Range Status   SARS Coronavirus 2 by RT PCR NEGATIVE NEGATIVE Final    Comment: (NOTE) SARS-CoV-2 target nucleic acids are NOT DETECTED.  The SARS-CoV-2 RNA is generally detectable in upper and lower respiratory specimens during the acute phase of infection. The lowest concentration of SARS-CoV-2 viral copies this assay can detect is 250 copies / mL. A negative result does not preclude SARS-CoV-2 infection and should not be used as the sole basis for treatment or other patient management decisions.  A negative result may occur with improper specimen collection / handling, submission of specimen other than nasopharyngeal swab, presence of viral mutation(s) within the areas targeted by this assay, and inadequate number of viral copies (<250 copies / mL). A negative result must be combined with clinical observations, patient history, and epidemiological information.  Fact Sheet for Patients:   RoadLapTop.co.za  Fact Sheet for Healthcare Providers: http://kim-miller.com/  This test is not yet approved or  cleared by the [place] FDA and has  been authorized for detection and/or diagnosis of SARS-CoV-2 by FDA under an Emergency Use Authorization (EUA).  This EUA will remain in effect (meaning this test can be used) for the duration of the COVID-19  declaration under Section 564(b)(1) of the Act, 21 U.S.C. section 360bbb-3(b)(1), unless the authorization is terminated or revoked sooner.  Performed at [facility], [address] [place], [place] [address]   MRSA Next Gen by PCR, Nasal     Status: None   Collection Time: 05/19/23  5:53 PM   Specimen: Nasal Mucosa; Nasal Swab  Result Value Ref Range Status   MRSA by PCR Next Gen NOT DETECTED NOT DETECTED Final    Comment: (NOTE) The GeneXpert MRSA Assay (FDA approved for NASAL specimens only), is one component of a comprehensive MRSA colonization surveillance program. It is not intended to diagnose MRSA infection nor to guide or monitor treatment for MRSA infections. Test performance is not FDA approved in patients less than 45 years old. Performed at [facility], [address] [place], [place] [address]     Procedures and diagnostic studies:  No results found.             LOS: 4 days   Dominika Losey  [facility]   Pager on [email or web address]. If 7PM-7AM, please contact night-coverage at [email or web address]     05/22/2023, 3:54 PM

## 2023-05-23 ENCOUNTER — Other Ambulatory Visit (HOSPITAL_COMMUNITY): Payer: Self-pay

## 2023-05-23 ENCOUNTER — Inpatient Hospital Stay (HOSPITAL_COMMUNITY): Payer: Medicare HMO

## 2023-05-23 ENCOUNTER — Encounter: Payer: Self-pay | Admitting: Cardiovascular Disease

## 2023-05-23 DIAGNOSIS — I480 Paroxysmal atrial fibrillation: Secondary | ICD-10-CM | POA: Diagnosis not present

## 2023-05-23 DIAGNOSIS — I255 Ischemic cardiomyopathy: Secondary | ICD-10-CM | POA: Diagnosis not present

## 2023-05-23 DIAGNOSIS — I5022 Chronic systolic (congestive) heart failure: Secondary | ICD-10-CM | POA: Diagnosis not present

## 2023-05-23 DIAGNOSIS — I469 Cardiac arrest, cause unspecified: Secondary | ICD-10-CM | POA: Diagnosis not present

## 2023-05-23 LAB — BASIC METABOLIC PANEL
Anion gap: 7 (ref 5–15)
BUN: 19 mg/dL (ref 8–23)
CO2: 24 mmol/L (ref 22–32)
Calcium: 8.6 mg/dL — ABNORMAL LOW (ref 8.9–10.3)
Chloride: 105 mmol/L (ref 98–111)
Creatinine, Ser: 1.15 mg/dL (ref 0.61–1.24)
GFR, Estimated: >60 mL/min (ref >60)
Glucose, Bld: 116 mg/dL — ABNORMAL HIGH (ref 70–99)
Potassium: 3.7 mmol/L (ref 3.5–5.1)
Sodium: 136 mmol/L (ref 135–145)

## 2023-05-23 LAB — GLUCOSE, CAPILLARY
Glucose-Capillary: 98 mg/dL (ref 70–99)
Glucose-Capillary: 105 mg/dL — ABNORMAL HIGH (ref 70–99)
Glucose-Capillary: 118 mg/dL — ABNORMAL HIGH (ref 70–99)
Glucose-Capillary: 108 mg/dL — ABNORMAL HIGH (ref 70–99)

## 2023-05-23 LAB — MAGNESIUM: Magnesium: 2 mg/dL (ref 1.7–2.4)

## 2023-05-23 MED ORDER — LOSARTAN POTASSIUM 25 MG PO TABS
12.5000 mg | ORAL_TABLET | Freq: Every day | ORAL | Status: DC
  Administered 2023-05-23 – 2023-05-25 (×3): 12.5 mg via ORAL
  Filled 2023-05-23 (×3): qty 1

## 2023-05-23 MED ORDER — ALBUTEROL SULFATE (2.5 MG/3ML) 0.083% IN NEBU: 2.5000 mg | INHALATION_SOLUTION | Freq: Four times a day (QID) | RESPIRATORY_TRACT | Status: DC | PRN

## 2023-05-23 MED ORDER — GADOBUTROL 1 MMOL/ML IV SOLN
12.0000 mL | Freq: Once | INTRAVENOUS | Status: AC | PRN
  Administered 2023-05-23: 12 mL via INTRAVENOUS

## 2023-05-23 NOTE — Care Management Important Message (Signed)
Important Message  Patient Details  Name: Stuart Harvey MRN: [id number] Date of Birth: 19-Oct-1951   Medicare Important Message Given:  Yes     Johnell Comings 05/23/2023, 11:10 AM

## 2023-05-23 NOTE — Consult Note (Addendum)
PHARMACY CONSULT NOTE  Pharmacy Consult for Electrolyte Monitoring and Replacement   Recent Labs: Potassium (mmol/L)  Date Value  05/23/2023 3.7  02/20/2015 4.5   Magnesium (mg/dL)  Date Value  29/56/2130 2.0  12/06/2012 1.8   Calcium (mg/dL)  Date Value  86/57/8469 8.6 (L)   Calcium, Total (mg/dL)  Date Value  62/95/2841 8.8 (L)   Albumin (g/dL)  Date Value  32/44/0102 4.3  10/11/2022 4.1  12/13/2014 3.9   Phosphorus (mg/dL)  Date Value  72/53/6644 4.4  12/06/2012 3.3   Sodium (mmol/L)  Date Value  05/23/2023 136  10/11/2022 141  02/20/2015 135     Assessment: 72 y.o. male with a history of CAD status post CABG, HFrEF, AAA status post surgical repair in 2008, right iliac artery aneurysm status post endovascular repair in 2022, atrial fibrillation on Eliquis, CKD, hypertension, hyperlipidemia, hypothyroidism, anemia, GERD, and depression who presents with altered mental status after out of hospital cardiac arrest w/ ROSC after ~ 4 minutes. Pharmacy has been consulted to monitor and replace electrolytes while in ICU.  Goal of Therapy:  Potassium  4.0 - 5.1 mmol/L Magnesium  2.0 - 2.4 mg/dL All other electrolytes WNL  Plan:  No lasix. K+ WNL. No replacement needed. Patient transferred from the ICU. Pharmacy will sign off. Please re-consult if needed.   Ronnald Ramp, PharmD, BCPS Clinical Pharmacist   05/23/2023 8:37 AM

## 2023-05-23 NOTE — Progress Notes (Signed)
OT Cancellation Note  Patient Details Name: Stuart Harvey MRN: [id number] DOB: December 25, 1950   Cancelled Treatment:    Reason Eval/Treat Not Completed: Patient at procedure or test/ unavailable. Pt out of the room upon attempt this afternoon. Will re-attempt at later date/time as pt is available and appropriate.   Arman Filter., MPH, MS, OTR/L ascom [phone] 05/23/23, 3:41 PM

## 2023-05-23 NOTE — Progress Notes (Addendum)
Progress Note    Stuart Harvey  EAV:[id number] DOB: 03-27-1951  DOA: 05/18/2023 PCP: Marjie Skiff, NP      Brief Narrative:    Medical records reviewed and are as summarized below:  Stuart Harvey is a 72 y.o. male with past medical history significant for CAD status post CABG, HFrEF, AAA status post surgical repair in 2008, right iliac artery aneurysm status post endovascular repair in 2022, atrial fibrillation on Eliquis, CKD, hypertension, hyperlipidemia, hypothyroidism, anemia, GERD, and depression remote history of tobacco use disorder, COPD.  He was brought to the hospital because of altered mental status after cardiac arrest.  Reportedly, patient had been working in the garage and he called out for help.  His wife found him sitting on the stool leaning forward.  Subsequently, he lost consciousness and collapsed.  He was noted to be cyanotic.  When EMS arrived patient was pulseless so CPR was initiated.  ROSC was achieved after about 4 minutes of CPR.      Assessment/Plan:   Principal Problem:   Cardiac arrest (HCC) Active Problems:   CKD (chronic kidney disease) stage 3, GFR 30-59 ml/min (HCC)   Heart failure with reduced ejection fraction (HCC)   Acute respiratory failure (HCC)   Altered mental status   Hypotension   Typical atrial flutter (HCC)   Paroxysmal atrial fibrillation with RVR (HCC)   AKI (acute kidney injury) (HCC)   Hyperkalemia   Hypokalemia   Nutrition Problem: Inadequate oral intake Etiology: inability to eat (pt sedated and ventilated)  Signs/Symptoms: NPO status   Body mass index is 29.3 kg/m.   S/p cardiac arrest probably secondary to arrhythmia, history of ischemic cardiomyopathy EF of 40 to 45%, moderate asymmetric LVH on 2D ECHO Continue Toprol-XL Cardiac MRI has been ordered by cardiologist. Possible transfer to [facility] for EP study.  Follow-up with EP cardiologist for further recommendations.    Atrial  fibrillation/flutter with RVR Completed treatment with IV amiodarone and heparin drip.       S/p cardioversion on 05/22/2023  Continue Eliquis   Acute hypoxic respiratory failure post cardiac arrest S/p extubation on 05/19/2023. He is down from 3 L/min to 2 L/min oxygen via [place].  Wean off oxygen as able.    Acute on chronic CHF systolic BNP was 782 on admission.  S/p treatment with IV Lasix.    Musculoskeletal chest pain due to CPR Oxycodone and Tylenol as needed for pain.    AKI on CKD-IIIA Hyperkalemia, hypokalemia Improved    History of CAD status post CABG in 2008 continue aspirin, statin   Remote history of tobacco use disorder He smoked for over 30 years but quit smoking about 18 years ago. He said he has been told he has COPD.  Use bronchodilators as needed.   Thrombocytopenia Improving   Leukocytosis Resolved.  This was likely reactive.   Rayfield Citizen, pharmacist, recommended reducing citalopram from 40 to 20 mg daily to reduce risk of QTc prolongation   Other comorbidities include hypertension, hyperlipidemia, hypothyroidism, depression, GERD, arthritis    Diet Order             Diet Heart Room service appropriate? Yes; Fluid consistency: Thin  Diet effective now                            Consultants: Cardiologist Intensivist  Procedures: Cardioversion on 05/22/2023    Medications:    apixaban  5  mg Oral BID   aspirin EC  81 mg Oral Daily   buPROPion ER  150 mg Oral BID   citalopram  20 mg Oral Daily   levothyroxine  75 mcg Oral Q0600   losartan  12.5 mg Oral Daily   metoprolol succinate  12.5 mg Oral Daily   polyethylene glycol  17 g Per Tube Daily   rosuvastatin  40 mg Oral Daily   Continuous Infusions:  sodium chloride Stopped (05/18/23 2300)     Anti-infectives (From admission, onward)    Start     Dose/Rate Route Frequency Ordered Stop   05/18/23 2000  Ampicillin-Sulbactam (UNASYN) 3 g in sodium chloride 0.9 % 100  mL IVPB        3 g 200 mL/hr over 30 Minutes Intravenous Every 6 hours 05/18/23 1840 05/20/23 1515              Family Communication/Anticipated D/C date and plan/Code Status   DVT prophylaxis: SCDs Start: 05/18/23 1810 apixaban (ELIQUIS) tablet 5 mg     Code Status: Full Code  Family Communication: None Disposition Plan: Plan to discharge home in 1 to 2 days   Status is: Inpatient Remains inpatient appropriate because: S/p cardioversion for A-fib, s/p cardiac arrest       Subjective:    Interval events noted.  Chest pain has improved.  Cough has improved as well.  No shortness of breath.  Objective:    Vitals:   05/23/23 0500 05/23/23 0721 05/23/23 0929 05/23/23 1154  BP:  126/80  121/71  Pulse:  62  60  Resp:  20 16 18   Temp:  98 F (36.7 C)  98.3 F (36.8 C)  TempSrc:    Oral  SpO2:  97%  98%  Weight: 98 kg     Height:       No data found.   Intake/Output Summary (Last 24 hours) at 05/23/2023 1530 Last data filed at 05/23/2023 1447 Gross per 24 hour  Intake 1215.28 ml  Output 1825 ml  Net -609.72 ml   Filed Weights   05/21/23 0122 05/22/23 0500 05/23/23 0500  Weight: 95.2 kg 95.4 kg 98 kg    Exam:   GEN: NAD SKIN: Warm and dry EYES: EOMI ENT: MMM CV: RRR PULM: Mild bilateral wheezing, no rales heard ABD: soft, ND, NT, +BS CNS: AAO x 3, non focal EXT: No edema or tenderness   Data Reviewed:   I have personally reviewed following labs and imaging studies:  Labs: Labs show the following:   Basic Metabolic Panel: Recent Labs  Lab 05/18/23 2247 05/19/23 0444 05/20/23 0535 05/20/23 1509 05/21/23 0340 05/22/23 1230 05/23/23 0341  NA  --  141 137 135 136 134* 136  K  --  4.7 3.5 3.4* 3.7 3.4* 3.7  CL  --  113* 105 104 104 101 105  CO2  --  22 23 24 25 24 24   GLUCOSE  --  101* 108* 136* 109* 112* 116*  BUN  --  34* 26* 24* 22 16 19   CREATININE  --  1.65* 1.57* 1.48* 1.39* 1.17 1.15  CALCIUM  --  8.5* 8.4* 8.0* 8.3* 8.5*  8.6*  MG 2.4 2.4 2.0 2.0 2.1  --  2.0  PHOS 4.1 4.4  --   --   --   --   --    GFR Estimated Creatinine Clearance: 71.5 mL/min (by C-G formula based on SCr of 1.15 mg/dL). Liver Function Tests: Recent Labs  Lab  05/18/23 1627  AST 43*  ALT 20  ALKPHOS 103  BILITOT 1.0  PROT 7.5  ALBUMIN 4.3   No results for input(s): "LIPASE", "AMYLASE" in the last 168 hours. No results for input(s): "AMMONIA" in the last 168 hours. Coagulation profile Recent Labs  Lab 05/18/23 1627 05/18/23 2053  INR 5.4* 1.3*    CBC: Recent Labs  Lab 05/18/23 1627 05/19/23 0444 05/20/23 0535 05/21/23 0340 05/22/23 0455  WBC 18.2* 14.6* 13.1* 10.5 10.4  NEUTROABS 5.9  --   --   --   --   HGB 15.5 13.2 12.3* 11.9* 12.0*  HCT 48.3 41.4 38.3* 37.0* 36.6*  MCV 94.2 94.3 93.6 93.4 92.0  PLT 201 179 113* 114* 126*   Cardiac Enzymes: No results for input(s): "CKTOTAL", "CKMB", "CKMBINDEX", "TROPONINI" in the last 168 hours. BNP (last 3 results) No results for input(s): "PROBNP" in the last 8760 hours. CBG: Recent Labs  Lab 05/22/23 1135 05/22/23 1635 05/22/23 2111 05/23/23 0722 05/23/23 1155  GLUCAP 114* 109* 124* 98 105*   D-Dimer: No results for input(s): "DDIMER" in the last 72 hours. Hgb A1c: No results for input(s): "HGBA1C" in the last 72 hours.  Lipid Profile: No results for input(s): "CHOL", "HDL", "LDLCALC", "TRIG", "CHOLHDL", "LDLDIRECT" in the last 72 hours. Thyroid function studies: No results for input(s): "TSH", "T4TOTAL", "T3FREE", "THYROIDAB" in the last 72 hours.  Invalid input(s): "FREET3" Anemia work up: No results for input(s): "VITAMINB12", "FOLATE", "FERRITIN", "TIBC", "IRON", "RETICCTPCT" in the last 72 hours. Sepsis Labs: Recent Labs  Lab 05/18/23 1627 05/18/23 1820 05/19/23 0444 05/19/23 1759 05/20/23 0535 05/21/23 0340 05/22/23 0455  PROCALCITON  --   --   --  <0.10  --   --   --   WBC 18.2*  --  14.6*  --  13.1* 10.5 10.4  LATICACIDVEN >9.0* 1.9  --    --   --   --   --     Microbiology Recent Results (from the past 240 hour(s))  SARS Coronavirus 2 by RT PCR (hospital order, performed in [facility] hospital lab) *cepheid single result test* Anterior Nasal Swab     Status: None   Collection Time: 05/18/23  4:31 PM   Specimen: Anterior Nasal Swab  Result Value Ref Range Status   SARS Coronavirus 2 by RT PCR NEGATIVE NEGATIVE Final    Comment: (NOTE) SARS-CoV-2 target nucleic acids are NOT DETECTED.  The SARS-CoV-2 RNA is generally detectable in upper and lower respiratory specimens during the acute phase of infection. The lowest concentration of SARS-CoV-2 viral copies this assay can detect is 250 copies / mL. A negative result does not preclude SARS-CoV-2 infection and should not be used as the sole basis for treatment or other patient management decisions.  A negative result may occur with improper specimen collection / handling, submission of specimen other than nasopharyngeal swab, presence of viral mutation(s) within the areas targeted by this assay, and inadequate number of viral copies (<250 copies / mL). A negative result must be combined with clinical observations, patient history, and epidemiological information.  Fact Sheet for Patients:   RoadLapTop.co.za  Fact Sheet for Healthcare Providers: http://kim-miller.com/  This test is not yet approved or  cleared by the [place] FDA and has been authorized for detection and/or diagnosis of SARS-CoV-2 by FDA under an Emergency Use Authorization (EUA).  This EUA will remain in effect (meaning this test can be used) for the duration of the COVID-19 declaration under Section 564(b)(1) of  the Act, 21 U.S.C. section 360bbb-3(b)(1), unless the authorization is terminated or revoked sooner.  Performed at [facility], [address] [place], [place] [address]   MRSA Next Gen by PCR, Nasal     Status: None    Collection Time: 05/19/23  5:53 PM   Specimen: Nasal Mucosa; Nasal Swab  Result Value Ref Range Status   MRSA by PCR Next Gen NOT DETECTED NOT DETECTED Final    Comment: (NOTE) The GeneXpert MRSA Assay (FDA approved for NASAL specimens only), is one component of a comprehensive MRSA colonization surveillance program. It is not intended to diagnose MRSA infection nor to guide or monitor treatment for MRSA infections. Test performance is not FDA approved in patients less than 64 years old. Performed at [facility], [address] [place], [place] [address]     Procedures and diagnostic studies:  No results found.             LOS: 5 days   Lux Skilton  [facility]   Pager on [email or web address]. If 7PM-7AM, please contact night-coverage at [email or web address]     05/23/2023, 3:30 PM

## 2023-05-23 NOTE — Progress Notes (Addendum)
Patient Name: Stuart Harvey Date of Encounter: 05/23/2023  Primary Cardiologist: [name], MD Electrophysiologist: None  Interval Summary   NAEON, tx to floor. Chest discomfort from CPR is improving.   Wife at bedside  Inpatient Medications    Scheduled Meds:  apixaban  5 mg Oral BID   aspirin EC  81 mg Oral Daily   buPROPion ER  150 mg Oral BID   Chlorhexidine Gluconate Cloth  6 each Topical Daily   citalopram  20 mg Oral Daily   insulin aspart  0-5 Units Subcutaneous QHS   insulin aspart  0-9 Units Subcutaneous TID WC   levothyroxine  75 mcg Oral Q0600   metoprolol succinate  12.5 mg Oral Daily   polyethylene glycol  17 g Per Tube Daily   rosuvastatin  40 mg Oral Daily   Continuous Infusions:  sodium chloride Stopped (05/18/23 2300)   PRN Meds: docusate sodium, ipratropium-albuterol, oxyCODONE, polyethylene glycol, traZODone   Vital Signs    Vitals:   05/23/23 0100 05/23/23 0454 05/23/23 0500 05/23/23 0721  BP:  119/78  126/80  Pulse:  66  62  Resp: 20 20  20   Temp:  98.3 F (36.8 C)  98 F (36.7 C)  TempSrc:  Oral    SpO2:  93%  97%  Weight:   98 kg   Height:        Intake/Output Summary (Last 24 hours) at 05/23/2023 0746 Last data filed at 05/23/2023 0700 Gross per 24 hour  Intake 1265.28 ml  Output 1975 ml  Net -709.72 ml   Filed Weights   05/21/23 0122 05/22/23 0500 05/23/23 0500  Weight: 95.2 kg 95.4 kg 98 kg    Physical Exam    GEN- The patient is well appearing, alert and oriented x 3 today.   Lungs- Clear to ausculation bilaterally, normal work of breathing Cardiac- Irregularly irregular rate and rhythm, no murmurs, rubs or gallops GI- soft, NT, ND, + BS Extremities- no clubbing or cyanosis. No edema  Telemetry    SR w parox AFib, rates 60-80; rare PVC (personally reviewed)  Hospital Course    Stuart Harvey is a 72 y.o. male  with a history of CAD s/p CABG, HFrEF, ICM, parox AFib on Eliquis, HTN admitted for PEA arrest. EP  requested to see for discussion of ICD   Assessment & Plan    #) PEA arrest #) ?VT #) AFib w RVR #) LVH On further review, patient's initial 7/4 EMS run sheet rhythm appears more likely AFib w RVR  Has h/o VT per EMS run sheet 05/2018 - this also appears to be AFib w RVR New echo findings of regional wall motion abnormality and asymmetric LVH compared to 01/2023 echo Unclear whether excessive heat combined with afib RVR caused patients syncopal episodes, or whether patient did have VT Recommend cardiac MRI to further eval LVH - consider tx to [facility] for EPS to eval after cMRI Gen cardiology briefly initiated amiodarone gtt, has since been stopped Eliquis for stroke ppx        For questions or updates, please contact [facility] Please consult [email or web address] for contact info under Cardiology/STEMI.  Signed, Sherie Don, NP  05/23/2023, 7:46 AM   PEA arrest/syncope  Atrial fibrillation with a rapid ventricular response  Left ventricular hypertrophy question HCM  Ischemic heart disease with prior bypass surgery and remote MI  HFmrEF  The patient presented with gradual collapse witnessed by his wife.  She described it is blue albeit  she was behind him and his body was facedown.  CPR initiated on arrival by EMS he had no detectable pulse but a tachycardia with an irregular rate and rhythm consistent with atrial fibrillation.  Fluid resuscitation resulted in restoration of a blood pressure.  Subsequently was cardioverted to normal rhythm.  2019 had a similar episode, and that the ambient environment was extremely hot there was a collapse EMS was called and he was found to be in SVT this 1 regular and he was cardioverted.  Notable findings on testing included report of a wall motion abnormality on echo as well as an 18 mm thick septum suggesting hypertrophic cardiomyopathy.  Each of these will need to be clarified, we will undertake cMRI for the latter.  I suspect based on the other  echoes that this was an erroneous measurement.  Today have occasions however require clarification.  Also was found to have a wall motion abnormality raising the possibility of ventricular tachycardia as a triggering event for either the atrial fibrillation or the SVT secondary to [place] conduction.  Hence, I recommended that we undertake an EP study looking to see if he has inducible substrate with an ICD implanted in the event that he does for secondary prevention and admonishment to protect himself from ambient heat if it is negative.  I reviewed this with the patient family and the general cardiology team.

## 2023-05-23 NOTE — TOC Progression Note (Signed)
Transition of Care [facility]) - Progression Note    Patient Details  Name: Stuart Harvey MRN: [id number] Date of Birth: 04/27/1951  Transition of Care [facility]) CM/SW Contact  Margarito Liner, LCSW Phone Number: 05/23/2023, 1:30 PM  Clinical Narrative:  CSW continues to follow for discharge needs.   Expected Discharge Plan: Home w Home Health Services Barriers to Discharge: Continued Medical Work up  Expected Discharge Plan and Services       Living arrangements for the past 2 months: Single Family Home                           HH Arranged: PT, OT [facility] Agency: [facility] Date [facility] Agency Contacted: 05/20/23   Representative spoke with at [facility] Agency: Coralee [place]   Social Determinants of Health (SDOH) Interventions SDOH Screenings   Food Insecurity: No Food Insecurity (05/19/2023)  Housing: Low Risk  (05/19/2023)  Transportation Needs: No Transportation Needs (05/19/2023)  Utilities: Not At Risk (05/19/2023)  Alcohol Screen: Low Risk  (08/09/2022)  Depression (PHQ2-9): Low Risk  (02/28/2023)  Financial Resource Strain: Low Risk  (08/09/2022)  Physical Activity: Insufficiently Active (08/09/2022)  Social Connections: Socially Integrated (08/09/2022)  Stress: No Stress Concern Present (08/09/2022)  Tobacco Use: High Risk (05/23/2023)    Readmission Risk Interventions    05/20/2023    3:29 PM  Readmission Risk Prevention Plan  Transportation Screening Complete  PCP or Specialist Appt within 5-7 Days Complete  Home Care Screening Complete  Medication Review (RN CM) Complete

## 2023-05-23 NOTE — Progress Notes (Signed)
Occupational Therapy Treatment Patient Details Name: Stuart Harvey MRN: [id number] DOB: 05-27-1951 Today's Date: 05/23/2023   History of present illness 72 y/o male presented to ED on 05/18/23 for cardiac arrest s/p CPR with ROSC after ~4 minutes. Intubated 7/4-7/5. PMH: CAD s/p CABG, HFrEF, AAA s/p surgical repair in 2008, R iliac artery aneurysm s/p endovascular repair in 2022, Afib on Eliquis, CKD, HTN, anemia, depression.   OT comments  Pt seen for OT treatment on this date. Upon arrival to room pt resting in bed agreeable to tx. Pt requires MIN A for donning gown while standing. CGA during ambulation in hallway. Pt had a bout of LOB 2/2 distraction from task, requiring MIN A to correct. Pt educated on pacing, activity tolerance techniques, incentive spirometer, and breathing techniques. Pt reports that his chest pain did not increase with mobility. SpO2 in low 90's during ambulation, and 96% sitting EOB while on 2L [place]. Pt making good progress toward goals, will continue to follow POC. Discharge recommendation remains appropriate.     Recommendations for follow up therapy are one component of a multi-disciplinary discharge planning process, led by the attending physician.  Recommendations may be updated based on patient status, additional functional criteria and insurance authorization.    Assistance Recommended at Discharge Intermittent Supervision/Assistance  Patient can return home with the following  A little help with walking and/or transfers;A little help with bathing/dressing/bathroom;Help with stairs or ramp for entrance;Assist for transportation;Assistance with cooking/housework   Equipment Recommendations  None recommended by OT    Recommendations for Other Services      Precautions / Restrictions Precautions Precautions: Fall Restrictions Weight Bearing Restrictions: No       Mobility Bed Mobility                    Transfers Overall transfer level: Needs  assistance Equipment used: 1 person hand held assist Transfers: Sit to/from Stand Sit to Stand: Min guard                 Balance Overall balance assessment: Needs assistance Sitting-balance support: No upper extremity supported, Feet supported Sitting balance-Leahy Scale: Normal     Standing balance support: No upper extremity supported, During functional activity Standing balance-Leahy Scale: Good Standing balance comment: Standing in place while donning 2nd gown without LOB                           ADL either performed or assessed with clinical judgement   ADL Overall ADL's : Needs assistance/impaired                 Upper Body Dressing : Minimal assistance;Standing                   Functional mobility during ADLs: Min guard;Cueing for safety General ADL Comments: CGA for balance during standing transfer and ADL task.    Extremity/Trunk Assessment Upper Extremity Assessment Upper Extremity Assessment: Overall WFL for tasks assessed   Lower Extremity Assessment Lower Extremity Assessment: Overall WFL for tasks assessed        Vision       Perception     Praxis      Cognition Arousal/Alertness: Awake/alert Behavior During Therapy: WFL for tasks assessed/performed, Impulsive Overall Cognitive Status: Within Functional Limits for tasks assessed  Exercises      Shoulder Instructions       General Comments      Pertinent Vitals/ Pain       Pain Assessment Pain Assessment: Faces Faces Pain Scale: Hurts a little bit Pain Location: chest Pain Descriptors / Indicators: Aching, Discomfort Pain Intervention(s): Monitored during session  Home Living                                          Prior Functioning/Environment              Frequency  Min 1X/week        Progress Toward Goals  OT Goals(current goals can now be found in the care  plan section)  Progress towards OT goals: Progressing toward goals  Acute Rehab OT Goals Patient Stated Goal: To go home OT Goal Formulation: With patient Time For Goal Achievement: 06/03/23 Potential to Achieve Goals: Good ADL Goals Pt Will Perform Grooming: standing;with modified independence Pt Will Perform Lower Body Dressing: sit to/from stand;with modified independence;with adaptive equipment Pt Will Transfer to Toilet: bedside commode;regular height toilet;with modified independence;ambulating Pt Will Perform Toileting - Clothing Manipulation and hygiene: sit to/from stand;with modified independence  Plan Discharge plan remains appropriate;Frequency remains appropriate    Co-evaluation                 AM-PAC OT "6 Clicks" Daily Activity     Outcome Measure   Help from another person eating meals?: None Help from another person taking care of personal grooming?: A Little Help from another person toileting, which includes using toliet, bedpan, or urinal?: A Little Help from another person bathing (including washing, rinsing, drying)?: A Little Help from another person to put on and taking off regular upper body clothing?: A Little Help from another person to put on and taking off regular lower body clothing?: A Little 6 Click Score: 19    End of Session Equipment Utilized During Treatment: Oxygen  OT Visit Diagnosis: Other abnormalities of gait and mobility (R26.89);Muscle weakness (generalized) (M62.81)   Activity Tolerance Patient tolerated treatment well   Patient Left in bed;with call bell/phone within reach;with family/visitor present   Nurse Communication          Time: 1610-9604 OT Time Calculation (min): 15 min  Charges: OT General Charges $OT Visit: 1 Visit OT Treatments $Therapeutic Activity: 8-22 mins  Thresa Ross, OTS

## 2023-05-23 NOTE — TOC Benefit Eligibility Note (Signed)
Pharmacy Patient Advocate Encounter  Insurance verification completed.    The patient is insured through [employer] test claim for Jardiance 10 mg and the current 30 day co-pay is $150.25 due to a being in Coverage Gap (donut hole).  Ran test claim for Entresto 24-26 mg and the current 30 day co-pay is $169.17 due to a being in Coverage Gap (donut hole).   This test claim was processed through [facility]- copay amounts may vary at other pharmacies due to pharmacy/plan contracts, or as the patient moves through the different stages of their insurance plan.    Roland Earl, CPHT Pharmacy Patient Advocate Specialist [facility] Patient Advocate Team Direct Number: [phone]  Fax: [phone]

## 2023-05-23 NOTE — Progress Notes (Signed)
Progress Note  Patient Name: Stuart Harvey Date of Encounter: 05/23/2023  Primary Cardiologist: Kirke Corin  Subjective   Maintaining sinus rhythm following DCCV yesterday.  Musculoskeletal chest pain from CPR is improving.  Some wheezing.  No dizziness, presyncope, or syncope. Planning for cMRI.   Inpatient Medications    Scheduled Meds:  apixaban  5 mg Oral BID   aspirin EC  81 mg Oral Daily   buPROPion ER  150 mg Oral BID   citalopram  20 mg Oral Daily   levothyroxine  75 mcg Oral Q0600   losartan  12.5 mg Oral Daily   metoprolol succinate  12.5 mg Oral Daily   polyethylene glycol  17 g Per Tube Daily   rosuvastatin  40 mg Oral Daily   Continuous Infusions:  sodium chloride Stopped (05/18/23 2300)   PRN Meds: albuterol, docusate sodium, ipratropium-albuterol, oxyCODONE, polyethylene glycol, traZODone   Vital Signs    Vitals:   05/23/23 0454 05/23/23 0500 05/23/23 0721 05/23/23 0929  BP: 119/78  126/80   Pulse: 66  62   Resp: 20  20 16   Temp: 98.3 F (36.8 C)  98 F (36.7 C)   TempSrc: Oral     SpO2: 93%  97%   Weight:  98 kg    Height:        Intake/Output Summary (Last 24 hours) at 05/23/2023 1149 Last data filed at 05/23/2023 0900 Gross per 24 hour  Intake 1215.28 ml  Output 1625 ml  Net -409.72 ml   Filed Weights   05/21/23 0122 05/22/23 0500 05/23/23 0500  Weight: 95.2 kg 95.4 kg 98 kg    Telemetry    Maintaining sinus rhythm with PACs, 60s bpm - Personally Reviewed  ECG    No new tracings - Personally Reviewed  Physical Exam   GEN: No acute distress.   Neck: No JVD. Cardiac: RRR, no murmurs, rubs, or gallops.  Respiratory: Clear to auscultation bilaterally.  GI: Soft, nontender, non-distended.   MS: No edema; No deformity. Neuro:  Alert and oriented x 3; Nonfocal.  Psych: Normal affect.  Labs    Chemistry Recent Labs  Lab 05/18/23 1627 05/18/23 2053 05/21/23 0340 05/22/23 1230 05/23/23 0341  NA 139   < > 136 134* 136  K 5.7*    < > 3.7 3.4* 3.7  CL 112*   < > 104 101 105  CO2 10*   < > 25 24 24   GLUCOSE 160*   < > 109* 112* 116*  BUN 37*   < > 22 16 19   CREATININE 2.01*   < > 1.39* 1.17 1.15  CALCIUM 9.7   < > 8.3* 8.5* 8.6*  PROT 7.5  --   --   --   --   ALBUMIN 4.3  --   --   --   --   AST 43*  --   --   --   --   ALT 20  --   --   --   --   ALKPHOS 103  --   --   --   --   BILITOT 1.0  --   --   --   --   GFRNONAA 35*   < > 54* >60 >60  ANIONGAP 17*   < > 7 9 7    < > = values in this interval not displayed.     Hematology Recent Labs  Lab 05/20/23 0535 05/21/23 0340 05/22/23 0455  WBC 13.1* 10.5  10.4  RBC 4.09* 3.96* 3.98*  HGB 12.3* 11.9* 12.0*  HCT 38.3* 37.0* 36.6*  MCV 93.6 93.4 92.0  MCH 30.1 30.1 30.2  MCHC 32.1 32.2 32.8  RDW 14.8 14.6 14.3  PLT 113* 114* 126*    Cardiac EnzymesNo results for input(s): "TROPONINI" in the last 168 hours. No results for input(s): "TROPIPOC" in the last 168 hours.   BNP Recent Labs  Lab 05/18/23 1627  BNP 513.5*     DDimer No results for input(s): "DDIMER" in the last 168 hours.   Radiology    No results found.  Cardiac Studies   2D echo 05/19/2023: 1. Left ventricular ejection fraction, by estimation, is 40 to 45%. The  left ventricle has mildly decreased function. The left ventricle  demonstrates regional wall motion abnormalities (inferolateral wall  hypokinesis). There is moderate asymmetric left  ventricular hypertrophy of the septal segment. Left ventricular diastolic  parameters are consistent with Grade I diastolic dysfunction (impaired  relaxation).   2. Right ventricular systolic function is normal. The right ventricular  size is normal. There is normal pulmonary artery systolic pressure. The  estimated right ventricular systolic pressure is 29.0 mmHg.   3. The mitral valve is normal in structure. No evidence of mitral valve  regurgitation. No evidence of mitral stenosis.   4. Tricuspid valve regurgitation is moderate.   5.  The aortic valve is normal in structure. Aortic valve regurgitation is  not visualized. No aortic stenosis is present.   6. There is borderline dilatation of the aortic root, measuring 40 mm.   7. The inferior vena cava is normal in size with greater than 50%  respiratory variability, suggesting right atrial pressure of 3 mmHg.   Patient Profile     72 y.o. male with history of CAD s/p CABG x 4 in 2008 ([facility]), chronic HFrEF/ICM, PAF on Eliquis, HTN, HLD, infrarenal AAA s/p open repair 08/2007), right iliac artery aneurysm s/p EVAR 07/2021, hypothyroidism, anemia, depression, and GERD, admitted 7/4 following witnessed arrest at home.   Assessment & Plan    1. Cardiac arrest:  Patient with witnessed loss of consciousness by wife with immediate bystander CPR.  Per ED notes, EMS reported that patient was pulseless upon their arrival but ROSC achieved after 4 additional mins of CPR.  Did not require ACLS medications.  Afib w/ RVR upon arrival to ED.  High sensitivity troponin mildly elevated 21   40   101   116.  Echo 7/5 with slight improvement in EF to 45% with inferolateral hypokinesis.  Patient recalls some events prior to syncope in 2019 - was in garage, felt hot, sob, no chest pain or palpitations.  Etiology of his arrest remains unclear, cannot exclude excessive BMI with A-fib with RVR or VBG.  He has been evaluated by EP with recommendation to proceed with cardiac MRI to further evaluate LVH as well as possible transfer to [facility] for EP study to evaluate for inducible arrhythmia.  If this is positive, they plan to proceed with ICD.   2.  Afib w/ RVR/Atrial Flutter:  pt presented in Afib @ 180.  Loaded w/ amio on 7/5.   He underwent successful cardioversion on 7/8, and remains in sinus rhythm on Toprol-XL.  No longer on amiodarone.  Remains on apixaban.  3.  ICM/HFrEF:  EF 45% by echo 7/5.  Appears euvolemic, does note some wheezing (being treated for possible aspiration pneumonia).  Add low-dose  losartan 12.5 mg daily with continuation of Toprol-XL.  Escalate GDMT as able moving forward.   4.  CAD/Demand Ischemia:   s/p prior CABG w/ 3/4 patent grafts and known multivessel dzs on cath in 07/2022.  Denies any recent c/p.  HsTrop mildly elevated 21   40   101   116.  As above, Echo w/ EF of 45%, inflat HK.  Defer ischemic eval @ this time.  Cont ASA and statin.    5.  Hyperlipidemia: Continue rosuvastatin.   7.  Acute resp failure/Possible aspiration PNA: Per primary service.       For questions or updates, please contact [facility] Please consult [email or web address] for contact info under Cardiology/STEMI.    Signed, Eula Listen, PA-C [facility] Pager: [phone] 05/23/2023, 11:49 AM

## 2023-05-23 NOTE — Progress Notes (Signed)
PT Cancellation Note  Patient Details Name: Stuart Harvey MRN: [id number] DOB: 1951-06-21   Cancelled Treatment:    Reason Eval/Treat Not Completed: Other (comment);Patient at procedure or test/unavailable (Patient eating lunch and with visitors present. Spouse reporting patient due to go to MRI soon. PT will continue with attempts as patient available.)  [name], PT, MPT  [name] 05/23/2023, 1:27 PM

## 2023-05-24 ENCOUNTER — Encounter (HOSPITAL_COMMUNITY): Payer: Self-pay

## 2023-05-24 DIAGNOSIS — I469 Cardiac arrest, cause unspecified: Secondary | ICD-10-CM | POA: Diagnosis not present

## 2023-05-24 LAB — BASIC METABOLIC PANEL
Anion gap: 9 (ref 5–15)
BUN: 19 mg/dL (ref 8–23)
CO2: 23 mmol/L (ref 22–32)
Calcium: 8.7 mg/dL — ABNORMAL LOW (ref 8.9–10.3)
Chloride: 107 mmol/L (ref 98–111)
Creatinine, Ser: 1.14 mg/dL (ref 0.61–1.24)
GFR, Estimated: >60 mL/min (ref >60)
Glucose, Bld: 117 mg/dL — ABNORMAL HIGH (ref 70–99)
Potassium: 3.7 mmol/L (ref 3.5–5.1)
Sodium: 139 mmol/L (ref 135–145)

## 2023-05-24 LAB — GLUCOSE, CAPILLARY
Glucose-Capillary: 96 mg/dL (ref 70–99)
Glucose-Capillary: 140 mg/dL — ABNORMAL HIGH (ref 70–99)
Glucose-Capillary: 90 mg/dL (ref 70–99)
Glucose-Capillary: 122 mg/dL — ABNORMAL HIGH (ref 70–99)

## 2023-05-24 MED ORDER — CITALOPRAM HYDROBROMIDE 20 MG PO TABS: 20.0000 mg | ORAL_TABLET | Freq: Every day | ORAL | Status: DC

## 2023-05-24 MED ORDER — LOSARTAN POTASSIUM 25 MG PO TABS: 12.5000 mg | ORAL_TABLET | Freq: Every day | ORAL | Status: DC

## 2023-05-24 MED ORDER — ASPIRIN 81 MG PO TBEC: 81.0000 mg | DELAYED_RELEASE_TABLET | Freq: Every day | ORAL | 12 refills | Status: DC

## 2023-05-24 NOTE — Progress Notes (Addendum)
Patient Name: Stuart Harvey Date of Encounter: 05/24/2023  Primary Cardiologist: Stuart Bears, MD Electrophysiologist: None  Interval Summary   Requests to go home, no complaints   Wife at bedside  Inpatient Medications    Scheduled Meds:  apixaban  5 mg Oral BID   aspirin EC  81 mg Oral Daily   buPROPion ER  150 mg Oral BID   citalopram  20 mg Oral Daily   levothyroxine  75 mcg Oral Q0600   losartan  12.5 mg Oral Daily   metoprolol succinate  12.5 mg Oral Daily   polyethylene glycol  17 g Per Tube Daily   rosuvastatin  40 mg Oral Daily   Continuous Infusions:  sodium chloride Stopped (05/18/23 2300)   PRN Meds: albuterol, docusate sodium, ipratropium-albuterol, oxyCODONE, polyethylene glycol, traZODone   Vital Signs    Vitals:   05/23/23 2301 05/24/23 0322 05/24/23 0500 05/24/23 0742  BP: (!) 113/92 124/76  128/74  Pulse: 73 68  67  Resp: 20 18  18   Temp: 98.6 F (37 C) 98.6 F (37 C)  98.3 F (36.8 C)  TempSrc:      SpO2: 96% 95%  96%  Weight:   96.7 kg   Height:        Intake/Output Summary (Last 24 hours) at 05/24/2023 0935 Last data filed at 05/24/2023 0830 Gross per 24 hour  Intake 480 ml  Output 2250 ml  Net -1770 ml   Filed Weights   05/22/23 0500 05/23/23 0500 05/24/23 0500  Weight: 95.4 kg 98 kg 96.7 kg    Physical Exam    GEN- The patient is well appearing, alert and oriented x 3 today.   Lungs- Clear to ausculation bilaterally, normal work of breathing Cardiac- Irregularly irregular rate and rhythm, no murmurs, rubs or gallops GI- soft, NT, ND, + BS Extremities- no clubbing or cyanosis. No edema  Telemetry    SR w parox AFib, rates 60-80; rare PVC (personally reviewed)  Hospital Course    Stuart Harvey is a 72 y.o. male  with a history of CAD s/p CABG, HFrEF, ICM, parox AFib on Eliquis, HTN admitted for PEA arrest. EP requested to see for discussion of ICD   Assessment & Plan    #) PEA arrest #) syncope #) ?VT #) AFib  w RVR #) LVH, ?HCM #) CAD s/p CABG On further review, patient's initial 7/4 EMS run sheet rhythm appears more likely AFib w RVR  Has h/o VT per EMS run sheet 05/2018 - this also appears to be AFib w RVR Most recent echo findings of regional wall motion abnormality and asymmetric LVH compared to 01/2023 echo Unclear whether excessive heat combined with afib RVR caused patient's syncopal episodes, or whether patient did have VT  AFib Cardiac MRI yesterday not resulted as of yet.  If cMRI confirms wall motion abnormality and scar, then will tx to [facility] for EP study - discussed this with patient and wife, who are agreeable.  Gen cardiology briefly initiated amiodarone gtt, has since been stopped Eliquis for stroke ppx for Afib       For questions or updates, please contact [facility] Please consult [email or web address] for contact info under Cardiology/STEMI.  Signed, Sherie Don, NP  05/24/2023, 9:35 AM  PEA arrest/syncope   Atrial fibrillation with a rapid ventricular response   Left ventricular hypertrophy question HCM   Ischemic heart disease with prior bypass surgery and remote MI   HFmrEF  cMRI >> no evidence of  LVH but prior scar Would transfer pt for EPS to look for inducibe VT as potential trigger for syncope with SVT/AFib a marker ( 2/2 perhaps [place] conduction )  If EPS+>>ICD  If EPS ->> discharge with loop recorder

## 2023-05-24 NOTE — Progress Notes (Signed)
Physical Therapy Treatment Patient Details Name: Stuart Harvey MRN: [id number] DOB: October 14, 1951 Today's Date: 05/24/2023   History of Present Illness 72 y/o male presented to ED on 05/18/23 for cardiac arrest s/p CPR with ROSC after ~4 minutes. Intubated 7/4-7/5. PMH: CAD s/p CABG, HFrEF, AAA s/p surgical repair in 2008, R iliac artery aneurysm s/p endovascular repair in 2022, Afib on Eliquis, CKD, HTN, anemia, depression.    PT Comments  Patient supine in bed with wife present at start of session. Supervision for supine<>sit with HOB elevated. At rest HR 68 and Spo2 92 on RA. Min guard for sit<>stand due to impulsivity w/out AD. Ambulated 567ft no AD with min guard, cueing needed to slow patient down. Slighting drifting and narrow BOS during ambulation but not LOB. Spo2 83% w/ no complaints of SOB, HR 85. Returned to room and instructed Pt is PLB to improve O2 stats. O2 not rebounding >88 sitting EOB, contacted nursing for O2 orders. Placed Pt on 2L O2 via [place], Spo2 returned to 95%. Patient left supine in bed with call bell and wife present.     Assistance Recommended at Discharge Intermittent Supervision/Assistance  If plan is discharge home, recommend the following:  Can travel by private vehicle    A little help with walking and/or transfers;A little help with bathing/dressing/bathroom;Assistance with cooking/housework;Assist for transportation;Help with stairs or ramp for entrance      Equipment Recommendations  None recommended by PT    Recommendations for Other Services       Precautions / Restrictions Precautions Precautions: Fall Precaution Comments: watch O2 Restrictions Weight Bearing Restrictions: No     Mobility  Bed Mobility Overal bed mobility: Needs Assistance Bed Mobility: Supine to Sit, Sit to Supine     Supine to sit: HOB elevated, Supervision Sit to supine: HOB elevated, Supervision        Transfers Overall transfer level: Needs assistance Equipment  used: None Transfers: Sit to/from Stand Sit to Stand: Min guard           General transfer comment: STS min guard, no cueing needed for sequencing.    Ambulation/Gait Ambulation/Gait assistance: Min guard Gait Distance (Feet): 500 Feet Assistive device: None Gait Pattern/deviations: Step-through pattern, Narrow base of support, Drifts right/left       General Gait Details: Cueing needed to slow down during ambulation. CGA needed due to drifiting with gait due to inattention to ask.   Stairs             Wheelchair Mobility     Tilt Bed    Modified Rankin (Stroke Patients Only)       Balance Overall balance assessment: Needs assistance Sitting-balance support: No upper extremity supported, Feet supported Sitting balance-Leahy Scale: Normal     Standing balance support: No upper extremity supported, During functional activity Standing balance-Leahy Scale: Good Standing balance comment: standing in place while donning gown.                            Cognition Arousal/Alertness: Awake/alert Behavior During Therapy: WFL for tasks assessed/performed, Impulsive Overall Cognitive Status: Within Functional Limits for tasks assessed                                          Exercises Other Exercises Other Exercises: Assisted with donning gown, min A.    General Comments  Pertinent Vitals/Pain Pain Assessment Pain Assessment: 0-10 Pain Score: 2  Pain Location: chest Pain Descriptors / Indicators: Aching, Discomfort Pain Intervention(s): Limited activity within patient's tolerance, Monitored during session    Home Living                          Prior Function            PT Goals (current goals can now be found in the care plan section) Acute Rehab PT Goals PT Goal Formulation: With patient Time For Goal Achievement: 06/03/23 Potential to Achieve Goals: Good Progress towards PT goals: Progressing  toward goals    Frequency    Min 1X/week      PT Plan Current plan remains appropriate    Co-evaluation              AM-PAC PT "6 Clicks" Mobility   Outcome Measure  Help needed turning from your back to your side while in a flat bed without using bedrails?: A Little Help needed moving from lying on your back to sitting on the side of a flat bed without using bedrails?: A Little Help needed moving to and from a bed to a chair (including a wheelchair)?: A Little Help needed standing up from a chair using your arms (e.g., wheelchair or bedside chair)?: A Little Help needed to walk in hospital room?: A Little Help needed climbing 3-5 steps with a railing? : A Lot 6 Click Score: 17    End of Session Equipment Utilized During Treatment: Oxygen Activity Tolerance: Patient tolerated treatment well Patient left: in bed;with call bell/phone within reach;with family/visitor present Nurse Communication: Mobility status PT Visit Diagnosis: Unsteadiness on feet (R26.81);Muscle weakness (generalized) (M62.81);Other abnormalities of gait and mobility (R26.89)     Time: 8119-1478 PT Time Calculation (min) (ACUTE ONLY): 15 min  Charges:                            Malachi Carl, SPT    Malachi Carl 05/24/2023, 11:46 AM

## 2023-05-24 NOTE — Plan of Care (Signed)

## 2023-05-24 NOTE — TOC Progression Note (Signed)
Transition of Care [facility]) - Progression Note    Patient Details  Name: Stuart Harvey MRN: [id number] Date of Birth: [birth year]  Transition of Care [facility]) CM/SW Contact  Margarito Liner, LCSW Phone Number: 05/24/2023, 12:57 PM  Clinical Narrative:  Patient has orders to transfer to [facility]. CSW notified [facility] liaison.   Expected Discharge Plan: Home w Home Health Services Barriers to Discharge: Continued Medical Work up  Expected Discharge Plan and Services       Living arrangements for the past 2 months: Single Family Home Expected Discharge Date: 05/24/23                         HH Arranged: PT, OT HH Agency: [facility] Date [facility] Agency Contacted: 05/20/23   Representative spoke with at [facility] Agency: Coralee [place]   Social Determinants of Health (SDOH) Interventions SDOH Screenings   Food Insecurity: No Food Insecurity (05/19/2023)  Housing: Low Risk  (05/19/2023)  Transportation Needs: No Transportation Needs (05/19/2023)  Utilities: Not At Risk (05/19/2023)  Alcohol Screen: Low Risk  (08/09/2022)  Depression (PHQ2-9): Low Risk  (02/28/2023)  Financial Resource Strain: Low Risk  (08/09/2022)  Physical Activity: Insufficiently Active (08/09/2022)  Social Connections: Socially Integrated (08/09/2022)  Stress: No Stress Concern Present (08/09/2022)  Tobacco Use: High Risk (05/23/2023)    Readmission Risk Interventions    05/20/2023    3:29 PM  Readmission Risk Prevention Plan  Transportation Screening Complete  PCP or Specialist Appt within 5-7 Days Complete  Home Care Screening Complete  Medication Review (RN CM) Complete

## 2023-05-24 NOTE — H&P (Addendum)
Cardiology Admission History and Physical   Patient ID: Stuart Harvey MRN: [id number]; DOB: 02/24/1951   Admission date: (Not on file)  PCP:  Marjie Skiff, NP   [facility] Providers Cardiologist:  [name], MD   { EP: Berton Mount, MD  Chief Complaint:  syncope  Patient Profile:   Stuart Harvey is a 72 y.o. male with a history of CAD s/p CABG, HFrEF, ICM, parox AFib on Eliquis, HTN who is being seen 05/25/2023 for the evaluation of recurrent syncope and concern for cardiac arrest. Was admitted to [facility] and found to have 11% scar burden on cMRI.  History of Present Illness:   Stuart Harvey is a 72 y.o. male with above PMH who presented to [facility] ER after witnessed arrest. The patient was working in his garage July 4th, become SOB and lightheaded. He has a large fan in the garage for temp control. His wife came into garage, patient was sitting on stool with head in hands and told her he didn't feel well. Patient then had LOC, was found by wife face down on concrete floor. Wife noted that patient was blue and started CPR. Patient and wife are unsure how long patient was sitting with head in hands, they guess about 10 minutes.  Per EMS run sheet, on their arrival patient was blue from nipple line up with agonal respirations, no palpable pulse.  Initial 7/4 rhythm strip per EMS:     Initial 7/4 EKG per EMS:    No medications given by EMS. EMS obtained IO access, administered fluids, and on following VS check, patient had pulse and normal BP.  When patient arrived at [facility] ER, he was in AFib w RVR. Intubated on admission, has now been extubated. Updated echo showed LVEF 40-45% (unchanged from prior). No additional ischemic workup done.    Of note, patient has history of similar episode 05/2018 where he was found down on back porch by wife. Per EMR, EMS found patient to be in VT and CV x 1 prior to arrival at [facility] ER.    05/2018 EMS Run sheet--    While admitted at  [facility], he developed persistent AFib with hypotension. He was briefly amiodarone loaded and is s/p DCCV 7/8 with return to NSR. Has continued to have paroxysms of AFib since.   Updated echo shows new WMA with increased septal thickness compared to echo 01/2023. A cardiac MRI was recommended to further evaluate these new findings which found 11% scar burden. As such, an EP study has been recommended to further evaluate whether he has inducible VT that may have preceded his AFib w RVR episodes.   Currently, he is without complaints. He continues to have some chest discomfort from receiving CPR, well-controlled with PRN pain medication. He denies SOB, palpitations, increased edema. Has good appetite.   Wife has been at bedside.   Pt received lasix this am for increased work of breathing.   Past Medical History:  Diagnosis Date   AAA (abdominal aortic aneurysm) (HCC)    a.) s/p EVAR in 08/2007 by Dr. Earnestine Leys. b.) CTA on 06/30/2021 --> interval aneurysmal dilitation superior to stent graft with the juxtarenal aorta measuring 3.8 cm.   Anemia    Arthritis    both knees   Chronic anticoagulation    Apixaban   Chronic HFrEF (heart failure with reduced ejection fraction) (HCC)    a. 07/2022 Echo: EF 35-40%, glob HK, mild LVH, GrI DD, nl RV fxn, mildly  dil RA/LA, mild MR, mild AoV sclerosis. Asc Ao 40mm.   CKD (chronic kidney disease), stage II    Coronary artery disease    a. 06/2007 CABGx4 @ [facility] (LIMA-LAD, VG-RPDA, VG-OM3,VG-D2); b. 2011 Cath: patent grafts EF 40%, c.06/05/18 Cath: Sev native dzs, LIMA-LAD patent, VG-RPDA patent, VG-OM3 mid-graft 60% and 80%; d. 07/2022 Cath: LM nl, LAD 50p, 90p/m, D1 70, RI  30, LCX 40ost/p, 100p CTO, OM3 100, RCA 100p CTO, LIMA->LAD ok, VG->D2 ok, VG->RPDA min irregs, VG->OM3 100->Med rx.   Depression    GERD (gastroesophageal reflux disease)    Hernia of abdominal cavity    History of 2019 novel coronavirus disease (COVID-19) 09/2019   Hyperlipidemia    Hypertension     Hypothyroidism    Iliac artery aneurysm, right (HCC)    a.) at the distal landing zone of the right iliac limb measuring 3 cm   Ischemic cardiomyopathy    Right middle lobe pulmonary nodule 06/30/2021   a.) measured 8 mm by CT on 06/30/2021.   S/P CABG x 4 06/25/2007   a.) LIMA-LAD, SVG-RPDA, SVG-OM3, SVG-D2   Sigmoid diverticulosis    Ventricular tachycardia (HCC) 05/2018   a.) found unresponsive by EMS; VT noted --> defibrillation achieved ROSC. Briefly intubated. Episode felt to be secondary to heat stoke.    Past Surgical History:  Procedure Laterality Date   ABDOMINAL AORTIC ANEURYSM REPAIR N/A 08/29/2007   Procedure: EVAR; Location: [facility]; Surgeon: Brynda Greathouse, MD   ABDOMINAL AORTIC ANEURYSM REPAIR N/A 12/05/2012   Procedure: Abdominal aortic aneurysm of approximately 6 cm in maximal diameter status post previous endovascular repair with type I and III endoleaks; Location: [facility]; Surgeon: Festus Barren, MD   CARDIAC CATHETERIZATION Left 06/28/2010   3v CAD with patent CABG grafts; LVEF 40%; stable aortic stent graft; Location: [facility]; Surgeon: Arnoldo Hooker, MD   CARDIAC CATHETERIZATION Left 06/18/2007   3v CAD; LVEF 50%; refer to CVTS for CABG; Location: [facility]; Surgeon: [name], MD   CARDIOVERSION N/A 05/22/2023   Procedure: CARDIOVERSION;  Surgeon: Iran Ouch, MD;  Location: [facility] ORS;  Service: Cardiovascular;  Laterality: N/A;   COLONOSCOPY WITH PROPOFOL N/A 11/17/2021   Procedure: COLONOSCOPY WITH PROPOFOL;  Surgeon: Toney Reil, MD;  Location: [facility] ENDOSCOPY;  Service: Gastroenterology;  Laterality: N/A;   CORONARY ARTERY BYPASS GRAFT N/A 06/25/2007   Procedure: 4v CABG (LIMA-LAD, SVG-RPDA, SVG-OM3, SVG-D2); Location: [facility]; Surgeon: Marshell Garfinkel, MD   CORONARY/GRAFT ANGIOGRAPHY N/A 06/05/2018   Procedure: Rosalie Doctor ANGIOGRAPHY;  Surgeon: Yvonne Kendall, MD;  Location: [facility] INVASIVE CV LAB;  Service: Cardiovascular;  Laterality: N/A;   ENDOVASCULAR  REPAIR/STENT GRAFT N/A 08/04/2021   Procedure: ENDOVASCULAR REPAIR/STENT GRAFT;  Surgeon: Annice Needy, MD;  Location: [facility] INVASIVE CV LAB;  Service: Cardiovascular;  Laterality: N/A;   EXPLORATORY LAPAROTOMY  1997   HERNIA REPAIR     KNEE ARTHROPLASTY Left 09/19/2016   Procedure: COMPUTER ASSISTED TOTAL KNEE ARTHROPLASTY;  Surgeon: Donato [name], MD;  Location: [facility] ORS;  Service: Orthopedics;  Laterality: Left;   RIGHT/LEFT HEART CATH AND CORONARY ANGIOGRAPHY N/A 08/05/2022   Procedure: RIGHT/LEFT HEART CATH AND CORONARY ANGIOGRAPHY;  Surgeon: Iran Ouch, MD;  Location: [facility] INVASIVE CV LAB;  Service: Cardiovascular;  Laterality: N/A;   TOTAL KNEE ARTHROPLASTY Right 09/2016     Medications Prior to Admission: Prior to Admission medications   Medication Sig Start Date End Date Taking? Authorizing Provider  apixaban (ELIQUIS) 5 MG TABS tablet Take 1 tablet (5 mg total) by mouth  2 (two) times daily. 11/16/22   Sondra Barges, PA-C  aspirin EC 81 MG tablet Take 1 tablet (81 mg total) by mouth daily. Swallow whole. 05/25/23   Charise Killian, MD  buPROPion (WELLBUTRIN SR) 200 MG 12 hr tablet Take 1 tablet (200 mg total) by mouth 2 (two) times daily. 11/16/22   Aura Dials T, NP  Cholecalciferol 50 MCG (2000 UT) CAPS Take 1 capsule by mouth daily.     [provider]  citalopram (CELEXA) 20 MG tablet Take 1 tablet (20 mg total) by mouth daily. 05/25/23   Charise Killian, MD  citalopram (CELEXA) 40 MG tablet Take 1 tablet (40 mg total) by mouth daily. 04/26/23   Cannady, Corrie Dandy T, NP  ezetimibe (ZETIA) 10 MG tablet Take 1 tablet (10 mg total) by mouth daily. 03/28/23   Cannady, Corrie Dandy T, NP  levothyroxine (SYNTHROID) 75 MCG tablet Take 1 tablet (75 mcg total) by mouth daily. 02/21/23   Cannady, Corrie Dandy T, NP  losartan (COZAAR) 25 MG tablet Take 0.5 tablets (12.5 mg total) by mouth daily. 05/25/23 06/24/23  Charise Killian, MD  metoprolol succinate (TOPROL XL) 25 MG 24 hr tablet  Take 0.5 tablets (12.5 mg total) by mouth daily. 11/10/22   Dunn, Raymon Mutton, PA-C  omeprazole (PRILOSEC) 20 MG capsule Take 1 capsule (20 mg total) by mouth daily. 02/21/23   Cannady, Corrie Dandy T, NP  rosuvastatin (CRESTOR) 40 MG tablet Take 1 tablet (40 mg total) by mouth daily. 04/26/23   Iran Ouch, MD  sacubitril-valsartan (ENTRESTO) 24-26 MG Take 1 tablet by mouth 2 (two) times daily. Patient not taking: Reported on 05/18/2023 10/14/22   Sondra Barges, PA-C     Allergies:    Allergies  Allergen Reactions   Benzodiazepines     Paradoxical agitation and delirium   Codeine Nausea Only   Tetracycline Rash    Social History:   Social History   Socioeconomic History   Marital status: Married    Spouse name: Not on file   Number of children: Not on file   Years of education: 14   Highest education level: Some college, no degree  Occupational History   Occupation: retired  Tobacco Use   Smoking status: Former    Current packs/day: 0.00    Average packs/day: 1 pack/day for 35.0 years (35.0 ttl pk-yrs)    Types: Cigarettes    Start date: 12/16/1970    Quit date: 12/16/2005    Years since quitting: 17.4    Passive exposure: Past   Smokeless tobacco: Current    Types: Chew   Tobacco comments:    pt states he very rarely uses smokeless tobacco   Vaping Use   Vaping status: Never Used  Substance and Sexual Activity   Alcohol use: No   Drug use: No   Sexual activity: Yes    Partners: Female  Other Topics Concern   Not on file  Social History Narrative   Not on file   Social Determinants of Health   Financial Resource Strain: Low Risk  (08/09/2022)   Overall Financial Resource Strain (CARDIA)    Difficulty of Paying Living Expenses: Not hard at all  Food Insecurity: No Food Insecurity (05/19/2023)   Hunger Vital Sign    Worried About Running Out of Food in the Last Year: Never true    Ran Out of Food in the Last Year: Never true  Transportation Needs: No Transportation Needs  (05/19/2023)   PRAPARE -  Administrator, Civil Service (Medical): No    Lack of Transportation (Non-Medical): No  Physical Activity: Insufficiently Active (08/09/2022)   Exercise Vital Sign    Days of Exercise per Week: 3 days    Minutes of Exercise per Session: 30 min  Stress: No Stress Concern Present (08/09/2022)   [employer] of Occupational Health - Occupational Stress Questionnaire    Feeling of Stress : Not at all  Social Connections: Socially Integrated (08/09/2022)   Social Connection and Isolation Panel [NHANES]    Frequency of Communication with Friends and Family: More than three times a week    Frequency of Social Gatherings with Friends and Family: Once a week    Attends Religious Services: More than 4 times per year    Active Member of [employer] or Organizations: Yes    Attends Banker Meetings: Never    Marital Status: Married  Catering manager Violence: Not At Risk (05/19/2023)   Humiliation, Afraid, Rape, and Kick questionnaire    Fear of Current or Ex-Partner: No    Emotionally Abused: No    Physically Abused: No    Sexually Abused: No    Family History:   The patient's family history includes Depression in his maternal grandfather and maternal grandmother; Heart attack in his father, mother, and another family member; Heart disease in his mother and another family member; Hypertension in his father and mother; Kidney disease in his brother. There is no history of Prostate cancer, Kidney cancer, Stomach cancer, or Colon cancer.    ROS:  Please see the history of present illness.  All other ROS reviewed and negative.     Physical Exam/Data:  There were no vitals filed for this visit. No intake or output data in the 24 hours ending 05/25/23 1542    05/25/2023    9:00 AM 05/24/2023    5:00 AM 05/23/2023    5:00 AM  Last 3 Weights  Weight (lbs) 211 lb 8 oz 213 lb 3 oz 216 lb 0.8 oz  Weight (kg) 95.936 kg 96.7 kg 98 kg     There is no height  or weight on file to calculate BMI.  General:  Well nourished, well developed, in no acute distress HEENT: normal Neck: no JVD Vascular: No carotid bruits; Distal pulses 2+ bilaterally   Cardiac:  irregularly irregular; RRR; no murmur  Lungs:  clear to auscultation bilaterally, no wheezing, rhonchi or rales  Abd: soft, nontender, no hepatomegaly  Ext: no edema Musculoskeletal:  No deformities, BUE and BLE strength normal and equal Skin: warm and dry; healing abrasions to face and arms Neuro:  CNs 2-12 intact, no focal abnormalities noted Psych:  Normal affect   EKG:  The ECG that was done 05/22/2023 was personally reviewed and demonstrates NSR w PVCs, PACs, rate 56bpm; LAD, LBBB  Relevant CV Studies:  Cardiac MRI, 05/23/2023 1.  Moderately reduced LV systolic function.  LVEF = 38% 2.  Lateral wall LV Basal-mid subendocardial scar/LGE. 3.  LGE/scar accounts for 12.77g (11%) of total myocardial mass. 4.  Normal RV function. 5.  Findings consistent with prior LV infarct. 6.  Ischemic cardiomyopathy.  TTE, 05/19/2023  1. Left ventricular ejection fraction, by estimation, is 40 to 45%. The left ventricle has mildly decreased function. The left ventricle demonstrates regional wall motion abnormalities (inferolateral wall hypokinesis). There is moderate asymmetric left ventricular hypertrophy of the septal segment. Left ventricular diastolic parameters are consistent with Grade I diastolic dysfunction (impaired relaxation).  2. Right ventricular systolic function is normal. The right ventricular size is normal. There is normal pulmonary artery systolic pressure. The estimated right ventricular systolic pressure is 29.0 mmHg.   3. The mitral valve is normal in structure. No evidence of mitral valve regurgitation. No evidence of mitral stenosis.   4. Tricuspid valve regurgitation is moderate.   5. The aortic valve is normal in structure. Aortic valve regurgitation is not visualized. No aortic  stenosis is present.   6. There is borderline dilatation of the aortic root, measuring 40 mm.   7. The inferior vena cava is normal in size with greater than 50% respiratory variability, suggesting right atrial pressure of 3 mmHg.   TTE, 01/18/2023  1. Left ventricular ejection fraction, by estimation, is 35 to 40%. The left ventricle has moderately decreased function. The left ventricle demonstrates global hypokinesis. There is mild left ventricular hypertrophy. Left ventricular diastolic parameters are consistent with Grade I diastolic dysfunction (impaired relaxation).   2. Right ventricular systolic function is normal. The right ventricular size is moderately enlarged.   3. Left atrial size was mildly dilated.   4. Right atrial size was mildly dilated.   5. The mitral valve is normal in structure. Mild mitral valve regurgitation.   6. The aortic valve is tricuspid. Aortic valve regurgitation is mild. Aortic valve sclerosis/calcification is present, without any evidence of aortic stenosis.   7. Aortic dilatation noted. There is mild dilatation of the aortic root and of the ascending aorta, measuring 40 mm.   8. The inferior vena cava is normal in size with greater than 50% respiratory variability, suggesting right atrial pressure of 3 mmHg.    Laboratory Data:  High Sensitivity Troponin:   Recent Labs  Lab 05/18/23 1627 05/18/23 1820 05/18/23 2247 05/19/23 0211  TROPONINIHS 21* 40* 101* 116*      Chemistry Recent Labs  Lab 05/21/23 0340 05/22/23 1230 05/23/23 0341 05/24/23 0628 05/25/23 0504  NA 136   < > 136 139 139  K 3.7   < > 3.7 3.7 4.1  CL 104   < > 105 107 105  CO2 25   < > 24 23 26   GLUCOSE 109*   < > 116* 117* 111*  BUN 22   < > 19 19 18   CREATININE 1.39*   < > 1.15 1.14 1.17  CALCIUM 8.3*   < > 8.6* 8.7* 8.8*  MG 2.1  --  2.0  --   --   GFRNONAA 54*   < > >60 >60 >60  ANIONGAP 7   < > 7 9 8    < > = values in this interval not displayed.    Recent Labs  Lab  05/18/23 1627  PROT 7.5  ALBUMIN 4.3  AST 43*  ALT 20  ALKPHOS 103  BILITOT 1.0   Lipids  Recent Labs  Lab 05/19/23 0444  TRIG 120   Hematology Recent Labs  Lab 05/21/23 0340 05/22/23 0455  WBC 10.5 10.4  RBC 3.96* 3.98*  HGB 11.9* 12.0*  HCT 37.0* 36.6*  MCV 93.4 92.0  MCH 30.1 30.2  MCHC 32.2 32.8  RDW 14.6 14.3  PLT 114* 126*   Thyroid No results for input(s): "TSH", "FREET4" in the last 168 hours. BNP Recent Labs  Lab 05/18/23 1627  BNP 513.5*    DDimer No results for input(s): "DDIMER" in the last 168 hours.   Radiology/Studies:  No results found.   Assessment and Plan:   #) PEA arrest #)  syncope #) ?VT #) AFib w RVR #) LVH, ?HCM #) CAD s/p CABG Unclear whether excessive heat combined with afib RVR caused patient's syncopal episodes, or whether patient did have VT  AFib Cardiac MRI with 11% scar burden Transferred to [facility] for EP Study in discussion with Dr. Graciela Husbands and Dr. Lalla Brothers.  Gen cardiology briefly initiated amiodarone gtt, has since been stopped Eliquis for stroke ppx for Afib with [facility] 7/8   Risk Assessment/Risk Scores:       CHA2DS2-VASc Score = 4   This indicates a 4.8% annual risk of stroke. The patient's score is based upon: CHF History: 1 HTN History: 1 Diabetes History: 0 Stroke History: 0 Vascular Disease History: 1 Age Score: 1 Gender Score: 0   Code Status: Full Code  Severity of Illness: The appropriate patient status for this patient is INPATIENT. Inpatient status is judged to be reasonable and necessary in order to provide the required intensity of service to ensure the patient's safety. The patient's presenting symptoms, physical exam findings, and initial radiographic and laboratory data in the context of their chronic comorbidities is felt to place them at high risk for further clinical deterioration. Furthermore, it is not anticipated that the patient will be medically stable for discharge from the hospital within  2 midnights of admission.   * I certify that at the point of admission it is my clinical judgment that the patient will require inpatient hospital care spanning beyond 2 midnights from the point of admission due to high intensity of service, high risk for further deterioration and high frequency of surveillance required.*   For questions or updates, please contact [facility] Please consult [email or web address] for contact info under     Signed, Luane School  05/25/2023 3:42 PM

## 2023-05-24 NOTE — Discharge Summary (Addendum)
Physician Discharge Summary  Stuart Harvey:[id number] DOB: [birth year] DOA: 05/18/2023  PCP: Marjie Skiff, NP  Admit date: 05/18/2023 Discharge date: 05/25/23  Admitted From: home  Disposition:  transfer to [name]  Recommendations for Outpatient Follow-up:  Follow up as per [name] physicians recs   Home Health: no  Equipment/Devices:  Discharge Condition: stable  CODE STATUS: full  Diet recommendation: Heart Healthy  Brief/Interim Summary: HPI was taken from [name]: Patient is a 72 year old male brought in by EMS following cardiac arrest at home.   Patient was intubated and sedated at the time of our evaluation. History was obtained through collateral from his wife and ED providers. He was in his usual state of health up until earlier today. His wife found him sitting in their garage and he complained of dizziness. He then lost consciousness and fell on the floor. She activated EMS and initiated bystander CPR. On EMS arrival and continuation of ACLS, ROSC was achieved.   On arrival to the ED, the patient was intubated and was noted to be hypotensive post intubation. He was started on vasopressors as well as propofol and fentnayl for analgosedation. Imaging included head, chest, abdomen, and pelvis CT's did not show any acute findings aside from rib fractures.   PCCM consulted for admission to the ICU.  As per [name]: 05/18/23: cardiac arrest, intubated, admit to the ICU  05/19/23: Pt successfully extubated to HHFNC @80 % and 60L  05/20/23: Pt remains on HHFNC @80 % and 50L. Will diurese again today 40 mg iv x1 dose    As per [name] 05/24/23: NP that works w/ EP evaluated pt and recommended that pt be transferred to [facility] to look for inducible VT. Pt was transferred to [facility]. For more information, please see previous progress/consult notes.     Discharge Diagnoses:  Principal Problem:   Cardiac arrest Stuart Harvey [facility]) Active Problems:   CKD (chronic kidney  disease) stage 3, GFR 30-59 ml/min (HCC)   Heart failure with reduced ejection fraction (HCC)   Acute respiratory failure (HCC)   Altered mental status   Hypotension   Typical atrial flutter (HCC)   Paroxysmal atrial fibrillation with RVR (HCC)   AKI (acute kidney injury) (HCC)   Hyperkalemia   Hypokalemia   Heart failure with mildly reduced ejection fraction (HFmrEF) (HCC)  Cardiac arrest: probably secondary to arrhythmia. Hx of ischemic cardiomyopathy EF of 40 to 45%, moderate asymmetric LVH on echo. Continue on metoprolol, losartan as per cardio   A. fib/flutter: w/ RVR. D/c IV amio, heparin drips. S/p cardioversion on 05/22/23. Continue on eliquis, aspirin   Acute hypoxic respiratory failure: s/p extubation on 05/19/2023. Continue on supplemental oxygen and wean as tolerated   Acute on chronic CHF systolic: s/p IV lasix. Monitor I/Os.    Musculoskeletal chest pain due to CPR: oxycodone, tylenol prn for pain    AKI on CKDIIIa: Cr is trending down from day prior  Hypokalemia: WNL today    Hx of CAD: s/p CABG in 2008. Continue on aspirin, statin    Thrombocytopenia: etiology unclear. Labile    Leukocytosis: resolved  HLD: continue on statin   Hypothyroidism: continue on home dose of levothyroxine   Depression: severity unknown. Continue on reduced dose of citalopram   Discharge Instructions  Discharge Instructions     Diet - low sodium heart healthy   Complete by: As directed    Discharge instructions   Complete by: As directed    F/u  as per [name] physicians recommend   Increase activity slowly   Complete by: As directed    No wound care   Complete by: As directed       Allergies as of 05/24/2023       Reactions   Benzodiazepines    Paradoxical agitation and delirium   Codeine Nausea Only   Tetracycline Rash        Medication List     STOP taking these medications    Entresto 24-26 MG Generic drug: sacubitril-valsartan       TAKE these  medications    apixaban 5 MG Tabs tablet Commonly known as: Eliquis Take 1 tablet (5 mg total) by mouth 2 (two) times daily.   aspirin EC 81 MG tablet Take 1 tablet (81 mg total) by mouth daily. Swallow whole. Start taking on: May 25, 2023   buPROPion 200 MG 12 hr tablet Commonly known as: WELLBUTRIN SR Take 1 tablet (200 mg total) by mouth 2 (two) times daily.   Cholecalciferol 50 MCG (2000 UT) Caps Take 1 capsule by mouth daily.   citalopram 20 MG tablet Commonly known as: CELEXA Take 1 tablet (20 mg total) by mouth daily. Start taking on: May 25, 2023 What changed:  medication strength how much to take   ezetimibe 10 MG tablet Commonly known as: ZETIA Take 1 tablet (10 mg total) by mouth daily.   levothyroxine 75 MCG tablet Commonly known as: SYNTHROID Take 1 tablet (75 mcg total) by mouth daily.   losartan 25 MG tablet Commonly known as: COZAAR Take 0.5 tablets (12.5 mg total) by mouth daily. Start taking on: May 25, 2023   metoprolol succinate 25 MG 24 hr tablet Commonly known as: Toprol XL Take 0.5 tablets (12.5 mg total) by mouth daily.   omeprazole 20 MG capsule Commonly known as: PRILOSEC Take 1 capsule (20 mg total) by mouth daily.   rosuvastatin 40 MG tablet Commonly known as: CRESTOR Take 1 tablet (40 mg total) by mouth daily.        Allergies  Allergen Reactions   Benzodiazepines     Paradoxical agitation and delirium   Codeine Nausea Only   Tetracycline Rash    Consultations: Cardio EP    Procedures/Studies: MR CARDIAC MORPHOLOGY W WO CONTRAST  Result Date: 05/24/2023 CLINICAL DATA:  Cardiac arrest, hx of ischemic cardiomyopathy EXAM: CARDIAC MRI TECHNIQUE: The patient was scanned on a 1.5 Tesla Siemens magnet. A dedicated cardiac coil was used. Functional imaging was done using Fiesta sequences. 2,3, and 4 chamber views were done to assess for RWMA's. Modified Simpson's rule using a short axis stack was used to calculate an  ejection fraction on a dedicated work [employer]. The patient received 12 ml cc of Gadavist. After 10 minutes inversion recovery sequences were used to assess for infiltration and scar tissue. Velocity flow mapping performed in the ascending aorta and main pulmonary artery. CONTRAST:  12 ml cc  of Gadavist FINDINGS: 1. Normal left ventricular size, mild LV thickness, moderately reduced LV systolic function (LVEF = 38%). There is global hypokinesis. There is basal-mid subendocardial late gadolinium enhancement (25% wall thickness) in the lateral wall of the left ventricular myocardium. LGE/scar accounts for 12.77g (11%) of total myocardial mass. LVEDV: 186 ml LVESV: 115 ml SV: 71 ml CO: 5.2 L/min Myocardial mass: 115g 2. Mildly dilated right ventricular size, normal thickness, normal systolic function (RVEF = 56%). There are no regional wall motion abnormalities. 3.  Mildly dilated  left and right atrial size. 4. Normal size of the aortic root, ascending aorta and pulmonary artery. 5.  No significant valvular abnormalities. 6.  Normal pericardium.  No pericardial effusion. IMPRESSION: 1.  Moderately reduced LV systolic function.  LVEF = 38% 2.  Lateral wall LV Basal-mid subendocardial scar/LGE. 3.  LGE/scar accounts for 12.77g (11%) of total myocardial mass. 4.  Normal RV function. 5.  Findings consistent with prior LV infarct. 6.  Ischemic cardiomyopathy. Electronically Signed   By: Debbe Odea M.D.   On: 05/24/2023 09:40   MR CARDIAC VELOCITY FLOW MAP  Result Date: 05/24/2023 CLINICAL DATA:  Cardiac arrest, hx of ischemic cardiomyopathy EXAM: CARDIAC MRI TECHNIQUE: The patient was scanned on a 1.5 Tesla Siemens magnet. A dedicated cardiac coil was used. Functional imaging was done using Fiesta sequences. 2,3, and 4 chamber views were done to assess for RWMA's. Modified Simpson's rule using a short axis stack was used to calculate an ejection fraction on a dedicated work Barrister's clerk. The patient received 12 ml cc of Gadavist. After 10 minutes inversion recovery sequences were used to assess for infiltration and scar tissue. Velocity flow mapping performed in the ascending aorta and main pulmonary artery. CONTRAST:  12 ml cc  of Gadavist FINDINGS: 1. Normal left ventricular size, mild LV thickness, moderately reduced LV systolic function (LVEF = 38%). There is global hypokinesis. There is basal-mid subendocardial late gadolinium enhancement (25% wall thickness) in the lateral wall of the left ventricular myocardium. LGE/scar accounts for 12.77g (11%) of total myocardial mass. LVEDV: 186 ml LVESV: 115 ml SV: 71 ml CO: 5.2 L/min Myocardial mass: 115g 2. Mildly dilated right ventricular size, normal thickness, normal systolic function (RVEF = 56%). There are no regional wall motion abnormalities. 3.  Mildly dilated left and right atrial size. 4. Normal size of the aortic root, ascending aorta and pulmonary artery. 5.  No significant valvular abnormalities. 6.  Normal pericardium.  No pericardial effusion. IMPRESSION: 1.  Moderately reduced LV systolic function.  LVEF = 38% 2.  Lateral wall LV Basal-mid subendocardial scar/LGE. 3.  LGE/scar accounts for 12.77g (11%) of total myocardial mass. 4.  Normal RV function. 5.  Findings consistent with prior LV infarct. 6.  Ischemic cardiomyopathy. Electronically Signed   By: Debbe Odea M.D.   On: 05/24/2023 09:40   DG Chest Port 1 View  Result Date: 05/20/2023 CLINICAL DATA:  Acute respiratory failure and hypoxia. EXAM: PORTABLE CHEST 1 VIEW COMPARISON:  05/19/2023 FINDINGS: Status post median sternotomy and CABG procedure. Fracture of the proximal 3 sternotomy wires are again noted. Right IJ catheter is noted with tip at the superior cavoatrial junction. Cardiomediastinal contours are stable. Interval decrease in lung volumes with increased opacities in both lower lobes. No signs of interstitial edema or pleural effusion.  IMPRESSION: Interval decrease in lung volumes with worsening aeration to both lower lobes. Electronically Signed   By: Signa Kell M.D.   On: 05/20/2023 07:21   ECHOCARDIOGRAM COMPLETE  Result Date: 05/19/2023    ECHOCARDIOGRAM REPORT   Patient Name:   Stuart Harvey Date of Exam: 05/19/2023 Medical Rec #:  [id number]       Height:       72.0 in Accession #:    [id number]      Weight:       208.3 lb Date of Birth:  [birth year]       BSA:          2.168 m Patient Age:  71 years        BP:           82/70 mmHg Patient Gender: M               HR:           106 bpm. Exam Location:  [facility] Procedure: 2D Echo, Cardiac Doppler and Color Doppler STAT ECHO Indications:     Cardiac Arrest I46.9  History:         Patient has prior history of Echocardiogram examinations, most                  recent 01/18/2023. CAD; Risk Factors:Hypertension.  Sonographer:     Cristela Blue Referring Phys:  [id number] KHABIB DGAYLI Diagnosing Phys: Julien Nordmann MD  Sonographer Comments: Echo performed with patient supine and on artificial respirator. IMPRESSIONS  1. Left ventricular ejection fraction, by estimation, is 40 to 45%. The left ventricle has mildly decreased function. The left ventricle demonstrates regional wall motion abnormalities (inferolateral wall hypokinesis). There is moderate asymmetric left ventricular hypertrophy of the septal segment. Left ventricular diastolic parameters are consistent with Grade I diastolic dysfunction (impaired relaxation).  2. Right ventricular systolic function is normal. The right ventricular size is normal. There is normal pulmonary artery systolic pressure. The estimated right ventricular systolic pressure is 29.0 mmHg.  3. The mitral valve is normal in structure. No evidence of mitral valve regurgitation. No evidence of mitral stenosis.  4. Tricuspid valve regurgitation is moderate.  5. The aortic valve is normal in structure. Aortic valve regurgitation is not visualized. No aortic stenosis is  present.  6. There is borderline dilatation of the aortic root, measuring 40 mm.  7. The inferior vena cava is normal in size with greater than 50% respiratory variability, suggesting right atrial pressure of 3 mmHg. FINDINGS  Left Ventricle: Left ventricular ejection fraction, by estimation, is 40 to 45%. The left ventricle has mildly decreased function. The left ventricle demonstrates regional wall motion abnormalities. The left ventricular internal cavity size was normal in size. There is moderate asymmetric left ventricular hypertrophy of the septal segment. Left ventricular diastolic parameters are consistent with Grade I diastolic dysfunction (impaired relaxation). Right Ventricle: The right ventricular size is normal. No increase in right ventricular wall thickness. Right ventricular systolic function is normal. There is normal pulmonary artery systolic pressure. The tricuspid regurgitant velocity is 2.45 m/s, and  with an assumed right atrial pressure of 5 mmHg, the estimated right ventricular systolic pressure is 29.0 mmHg. Left Atrium: Left atrial size was normal in size. Right Atrium: Right atrial size was normal in size. Pericardium: There is no evidence of pericardial effusion. Mitral Valve: The mitral valve is normal in structure. No evidence of mitral valve regurgitation. No evidence of mitral valve stenosis. Tricuspid Valve: The tricuspid valve is normal in structure. Tricuspid valve regurgitation is moderate . No evidence of tricuspid stenosis. Aortic Valve: The aortic valve is normal in structure. Aortic valve regurgitation is not visualized. No aortic stenosis is present. Aortic valve mean gradient measures 1.5 mmHg. Aortic valve peak gradient measures 3.0 mmHg. Aortic valve area, by VTI measures 3.92 cm. Pulmonic Valve: The pulmonic valve was normal in structure. Pulmonic valve regurgitation is not visualized. No evidence of pulmonic stenosis. Aorta: The aortic root is normal in size and  structure. There is borderline dilatation of the aortic root, measuring 40 mm. Venous: The inferior vena cava is normal in size with greater than 50% respiratory variability, suggesting right  atrial pressure of 3 mmHg. IAS/Shunts: No atrial level shunt detected by color flow Doppler.  LEFT VENTRICLE PLAX 2D LVIDd:         5.20 cm     Diastology LVIDs:         4.40 cm     LV e' medial:    7.94 cm/s LV PW:         0.90 cm     LV E/e' medial:  5.4 LV IVS:        1.80 cm     LV e' lateral:   12.50 cm/s LVOT diam:     2.30 cm     LV E/e' lateral: 3.4 LV SV:         37 LV SV Index:   17 LVOT Area:     4.15 cm  LV Volumes (MOD) LV vol d, MOD A2C: 66.3 ml LV vol d, MOD A4C: 87.7 ml LV vol s, MOD A2C: 33.3 ml LV vol s, MOD A4C: 43.1 ml LV SV MOD A2C:     33.0 ml LV SV MOD A4C:     87.7 ml LV SV MOD BP:      41.3 ml RIGHT VENTRICLE RV Basal diam:  3.80 cm RV Mid diam:    2.60 cm RV S prime:     10.90 cm/s TAPSE (M-mode): 2.3 cm LEFT ATRIUM           Index        RIGHT ATRIUM           Index LA diam:      1.80 cm 0.83 cm/m   RA Area:     23.60 cm LA Vol (A2C): 10.2 ml 4.71 ml/m   RA Volume:   74.10 ml  34.18 ml/m LA Vol (A4C): 45.4 ml 20.94 ml/m  AORTIC VALVE AV Area (Vmax):    3.22 cm AV Area (Vmean):   3.10 cm AV Area (VTI):     3.92 cm AV Vmax:           86.50 cm/s AV Vmean:          62.900 cm/s AV VTI:            0.094 m AV Peak Grad:      3.0 mmHg AV Mean Grad:      1.5 mmHg LVOT Vmax:         67.10 cm/s LVOT Vmean:        47.000 cm/s LVOT VTI:          0.088 m LVOT/AV VTI ratio: 0.94  AORTA Ao Root diam: 3.96 cm MITRAL VALVE               TRICUSPID VALVE MV Area (PHT): 6.83 cm    TR Peak grad:   24.0 mmHg MV Decel Time: 111 msec    TR Vmax:        245.00 cm/s MV E velocity: 43.00 cm/s MV A velocity: 63.40 cm/s  SHUNTS MV E/A ratio:  0.68        Systemic VTI:  0.09 m                            Systemic Diam: 2.30 cm Julien Nordmann MD Electronically signed by Julien Nordmann MD Signature Date/Time:  05/19/2023/12:45:29 PM    Final    DG Chest Port 1 View  Result Date: 05/19/2023 CLINICAL DATA:  Acute respiratory failure with hypoxia.  Status post cardiac arrest. EXAM: PORTABLE CHEST 1 VIEW COMPARISON:  05/18/2023 FINDINGS: There is a right IJ catheter with tip in the projection of the cavoatrial junction. Interval extubation. Signs of previous median sternotomy and CABG procedure. As noted previously there is fracture of the proximal 3 sternotomy wires. Cardiomediastinal contours are unremarkable. Trace interstitial edema with thickening of the peripheral septal lines in the left lung. No pleural effusion or airspace consolidation. IMPRESSION: 1. Interval extubation. 2. Trace interstitial edema. Electronically Signed   By: Signa Kell M.D.   On: 05/19/2023 12:30   DG Chest Port 1 View  Result Date: 05/18/2023 CLINICAL DATA:  Central line EXAM: PORTABLE CHEST 1 VIEW COMPARISON:  05/18/2023 FINDINGS: Endotracheal tube tip is about 3 cm superior to carina. Esophageal tube tip below the diaphragm but incompletely visualized. Post sternotomy changes. Right IJ central venous catheter tip over the SVC. No visible pneumothorax. Stable cardiomediastinal silhouette. Patchy atelectasis left base. IMPRESSION: Right IJ central venous catheter tip over the SVC. No visible pneumothorax. Electronically Signed   By: Jasmine Pang M.D.   On: 05/18/2023 22:45   CT Angio Chest/Abd/Pel for Dissection W and/or Wo Contrast  Result Date: 05/18/2023 CLINICAL DATA:  Post cardiac arrest EXAM: CT ANGIOGRAPHY CHEST, ABDOMEN AND PELVIS TECHNIQUE: Non-contrast CT of the chest was initially obtained. Multidetector CT imaging through the chest, abdomen and pelvis was performed using the standard protocol during bolus administration of intravenous contrast. Multiplanar reconstructed images and MIPs were obtained and reviewed to evaluate the vascular anatomy. RADIATION DOSE REDUCTION: This exam was performed according to the  departmental dose-optimization program which includes automated exposure control, adjustment of the mA and/or kV according to patient size and/or use of iterative reconstruction technique. CONTRAST:  OMNIPAQUE IOHEXOL 350 MG/ML SOLN COMPARISON:  None Available. FINDINGS: CTA CHEST FINDINGS VASCULAR Aorta: Satisfactory opacification of the aorta. Normal contour and caliber of the thoracic aorta. No evidence of aneurysm, dissection, or other acute aortic pathology. Moderate mixed calcific atherosclerosis Cardiovascular: No evidence of pulmonary embolism on limited non-tailored examination. Cardiomegaly. Three-vessel coronary artery calcifications status post median sternotomy and CABG. No pericardial effusion. Review of the MIP images confirms the above findings. NON VASCULAR Mediastinum/Nodes: No enlarged mediastinal, hilar, or axillary lymph nodes. Thyroid gland, trachea, and esophagus demonstrate no significant findings. Lungs/Pleura: Endotracheal intubation, tip above the carina. Dependent bibasilar scarring or atelectasis. No pleural effusion or pneumothorax. Musculoskeletal: No chest wall abnormality. Numerous nondisplaced fractures of the anterior ribs (series 6, image 61). Review of the MIP images confirms the above findings. CTA ABDOMEN AND PELVIS FINDINGS VASCULAR Status post aortobiiliac stent endograft repair of abdominal aortic aneurysm. No acute findings. Moderate mixed calcific atherosclerosis. Standard branching pattern of the abdominal aorta with solitary bilateral renal arteries. Review of the MIP images confirms the above findings. NON-VASCULAR Hepatobiliary: No solid liver abnormality is seen. No gallstones, gallbladder wall thickening, or biliary dilatation. Pancreas: Unremarkable. No pancreatic ductal dilatation or surrounding inflammatory changes. Spleen: Normal in size without significant abnormality. Adrenals/Urinary Tract: Adrenal glands are unremarkable. Numerous bilateral renal  cortical cysts, more numerous on the left, without obvious solid mass or suspicious contrast enhancement. No hydronephrosis. Kidneys are otherwise normal, without renal calculi, solid lesion, or hydronephrosis. Bladder is unremarkable. Stomach/Bowel: Esophagogastric tube with tip and side port below the diaphragm. Stomach is within normal limits. Appendix appears normal. No evidence of bowel wall thickening, distention, or inflammatory changes. Sigmoid diverticulosis Lymphatic: No enlarged abdominal or pelvic lymph nodes. Reproductive: No mass or other significant abnormality.  Other: No abdominal wall hernia or abnormality. No ascites. Musculoskeletal: No acute osseous findings. IMPRESSION: 1. No evidence of thoracic aortic aneurysm, dissection, or other acute aortic pathology. 2. Status post aortobiiliac stent endograft repair of the abdominal aorta without acute findings. 3. No evidence of pulmonary embolism on limited non-tailored examination. 4. Numerous nondisplaced fractures of the anterior ribs. No pneumothorax. 5. Cardiomegaly and coronary artery disease status post median sternotomy and CABG. 6. Sigmoid diverticulosis without evidence of acute diverticulitis. Aortic Atherosclerosis (ICD10-I70.0). Electronically Signed   By: Jearld Lesch M.D.   On: 05/18/2023 17:46   CT Head Wo Contrast  Result Date: 05/18/2023 CLINICAL DATA:  Cardiac arrest. EXAM: CT HEAD WITHOUT CONTRAST TECHNIQUE: Contiguous axial images were obtained from the base of the skull through the vertex without intravenous contrast. RADIATION DOSE REDUCTION: This exam was performed according to the departmental dose-optimization program which includes automated exposure control, adjustment of the mA and/or kV according to patient size and/or use of iterative reconstruction technique. COMPARISON:  MRI brain 11/23/2022 FINDINGS: Brain: There is no evidence for acute hemorrhage, hydrocephalus, mass lesion, or abnormal extra-axial fluid  collection. No definite CT evidence for acute infarction. Vascular: No hyperdense vessel or unexpected calcification. Skull: No evidence for fracture. No worrisome lytic or sclerotic lesion. Sinuses/Orbits: The visualized paranasal sinuses and mastoid air cells are clear. Visualized portions of the globes and intraorbital fat are unremarkable. Other: Gas is identified in venous anatomy of the lower head bilaterally, likely reflecting IV access. IMPRESSION: 1. No acute intracranial abnormality. 2. Gas in superficial venous anatomy of the lower head bilaterally, likely reflecting IV access. Electronically Signed   By: Kennith Center M.D.   On: 05/18/2023 17:35   DG Chest Port 1 View  Result Date: 05/18/2023 CLINICAL DATA:  Intubation EXAM: PORTABLE CHEST 1 VIEW COMPARISON:  06/03/2018 FINDINGS: Endotracheal tube with the tip 6.6 cm above the carina. Nasogastric tube with the tip projecting over the stomach. No focal consolidation. No pleural effusion or pneumothorax. Heart and mediastinal contours are unremarkable. Prior CABG. No acute osseous abnormality. IMPRESSION: 1. Endotracheal tube with the tip 6.6 cm above the carina. Electronically Signed   By: Elige Ko M.D.   On: 05/18/2023 17:11   (Echo, Carotid, EGD, Colonoscopy, ERCP)    Subjective: pt c/o fatigue    Discharge Exam: Vitals:   05/24/23 0742 05/24/23 1146  BP: 128/74 (!) 114/102  Pulse: 67 70  Resp: 18 18  Temp: 98.3 F (36.8 C) 98 F (36.7 C)  SpO2: 96% 100%   Vitals:   05/24/23 0322 05/24/23 0500 05/24/23 0742 05/24/23 1146  BP: 124/76  128/74 (!) 114/102  Pulse: 68  67 70  Resp: 18  18 18   Temp: 98.6 F (37 C)  98.3 F (36.8 C) 98 F (36.7 C)  TempSrc:      SpO2: 95%  96% 100%  Weight:  96.7 kg    Height:        General: Pt is alert, awake, not in acute distress Cardiovascular: S1/S2 +, no rubs, no gallops Respiratory: CTA bilaterally, no wheezing, no rhonchi Abdominal: Soft, NT, ND, bowel sounds + Extremities:  no edema, no cyanosis    The results of significant diagnostics from this hospitalization (including imaging, microbiology, ancillary and laboratory) are listed below for reference.     Microbiology: Recent Results (from the past 240 hour(s))  SARS Coronavirus 2 by RT PCR (hospital order, performed in [facility] hospital lab) *cepheid single result test* Anterior Nasal Swab  Status: None   Collection Time: 05/18/23  4:31 PM   Specimen: Anterior Nasal Swab  Result Value Ref Range Status   SARS Coronavirus 2 by RT PCR NEGATIVE NEGATIVE Final    Comment: (NOTE) SARS-CoV-2 target nucleic acids are NOT DETECTED.  The SARS-CoV-2 RNA is generally detectable in upper and lower respiratory specimens during the acute phase of infection. The lowest concentration of SARS-CoV-2 viral copies this assay can detect is 250 copies / mL. A negative result does not preclude SARS-CoV-2 infection and should not be used as the sole basis for treatment or other patient management decisions.  A negative result may occur with improper specimen collection / handling, submission of specimen other than nasopharyngeal swab, presence of viral mutation(s) within the areas targeted by this assay, and inadequate number of viral copies (<250 copies / mL). A negative result must be combined with clinical observations, patient history, and epidemiological information.  Fact Sheet for Patients:   RoadLapTop.co.za  Fact Sheet for Healthcare Providers: http://kim-miller.com/  This test is not yet approved or  cleared by the [place] FDA and has been authorized for detection and/or diagnosis of SARS-CoV-2 by FDA under an Emergency Use Authorization (EUA).  This EUA will remain in effect (meaning this test can be used) for the duration of the COVID-19 declaration under Section 564(b)(1) of the Act, 21 U.S.C. section 360bbb-3(b)(1), unless the authorization is  terminated or revoked sooner.  Performed at [facility], [address] [place], [place] [address]   MRSA Next Gen by PCR, Nasal     Status: None   Collection Time: 05/19/23  5:53 PM   Specimen: Nasal Mucosa; Nasal Swab  Result Value Ref Range Status   MRSA by PCR Next Gen NOT DETECTED NOT DETECTED Final    Comment: (NOTE) The GeneXpert MRSA Assay (FDA approved for NASAL specimens only), is one component of a comprehensive MRSA colonization surveillance program. It is not intended to diagnose MRSA infection nor to guide or monitor treatment for MRSA infections. Test performance is not FDA approved in patients less than 70 years old. Performed at [facility], [address] [place], [place] [address]      Labs: BNP (last 3 results) Recent Labs    05/18/23 1627  BNP 513.5*   Basic Metabolic Panel: Recent Labs  Lab 05/18/23 2247 05/19/23 0444 05/20/23 0535 05/20/23 1509 05/21/23 0340 05/22/23 1230 05/23/23 0341 05/24/23 0628  NA  --  141 137 135 136 134* 136 139  K  --  4.7 3.5 3.4* 3.7 3.4* 3.7 3.7  CL  --  113* 105 104 104 101 105 107  CO2  --  22 23 24 25 24 24 23   GLUCOSE  --  101* 108* 136* 109* 112* 116* 117*  BUN  --  34* 26* 24* 22 16 19 19   CREATININE  --  1.65* 1.57* 1.48* 1.39* 1.17 1.15 1.14  CALCIUM  --  8.5* 8.4* 8.0* 8.3* 8.5* 8.6* 8.7*  MG 2.4 2.4 2.0 2.0 2.1  --  2.0  --   PHOS 4.1 4.4  --   --   --   --   --   --    Liver Function Tests: Recent Labs  Lab 05/18/23 1627  AST 43*  ALT 20  ALKPHOS 103  BILITOT 1.0  PROT 7.5  ALBUMIN 4.3   No results for input(s): "LIPASE", "AMYLASE" in the last 168 hours. No results for input(s): "AMMONIA" in the last 168  hours. CBC: Recent Labs  Lab 05/18/23 1627 05/19/23 0444 05/20/23 0535 05/21/23 0340 05/22/23 0455  WBC 18.2* 14.6* 13.1* 10.5 10.4  NEUTROABS 5.9  --   --   --   --   HGB 15.5 13.2 12.3* 11.9* 12.0*  HCT 48.3 41.4 38.3* 37.0* 36.6*  MCV 94.2 94.3 93.6  93.4 92.0  PLT 201 179 113* 114* 126*   Cardiac Enzymes: No results for input(s): "CKTOTAL", "CKMB", "CKMBINDEX", "TROPONINI" in the last 168 hours. BNP: Invalid input(s): "POCBNP" CBG: Recent Labs  Lab 05/23/23 1155 05/23/23 1720 05/23/23 2100 05/24/23 0815 05/24/23 1149  GLUCAP 105* 118* 108* 96 140*   D-Dimer No results for input(s): "DDIMER" in the last 72 hours. Hgb A1c No results for input(s): "HGBA1C" in the last 72 hours. Lipid Profile No results for input(s): "CHOL", "HDL", "LDLCALC", "TRIG", "CHOLHDL", "LDLDIRECT" in the last 72 hours. Thyroid function studies No results for input(s): "TSH", "T4TOTAL", "T3FREE", "THYROIDAB" in the last 72 hours.  Invalid input(s): "FREET3" Anemia work up No results for input(s): "VITAMINB12", "FOLATE", "FERRITIN", "TIBC", "IRON", "RETICCTPCT" in the last 72 hours. Urinalysis    Component Value Date/Time   COLORURINE YELLOW (A) 05/18/2023 1737   APPEARANCEUR CLOUDY (A) 05/18/2023 1737   APPEARANCEUR Clear 09/11/2018 1353   LABSPEC 1.019 05/18/2023 1737   LABSPEC 1.014 02/20/2015 1933   PHURINE 5.0 05/18/2023 1737   GLUCOSEU NEGATIVE 05/18/2023 1737   GLUCOSEU Negative 02/20/2015 1933   HGBUR NEGATIVE 05/18/2023 1737   BILIRUBINUR NEGATIVE 05/18/2023 1737   BILIRUBINUR Negative 09/11/2018 1353   BILIRUBINUR Negative 02/20/2015 1933   KETONESUR NEGATIVE 05/18/2023 1737   PROTEINUR 100 (A) 05/18/2023 1737   NITRITE NEGATIVE 05/18/2023 1737   LEUKOCYTESUR LARGE (A) 05/18/2023 1737   LEUKOCYTESUR Negative 02/20/2015 1933   Sepsis Labs Recent Labs  Lab 05/19/23 0444 05/20/23 0535 05/21/23 0340 05/22/23 0455  WBC 14.6* 13.1* 10.5 10.4   Microbiology Recent Results (from the past 240 hour(s))  SARS Coronavirus 2 by RT PCR (hospital order, performed in [facility] hospital lab) *cepheid single result test* Anterior Nasal Swab     Status: None   Collection Time: 05/18/23  4:31 PM   Specimen: Anterior Nasal Swab  Result  Value Ref Range Status   SARS Coronavirus 2 by RT PCR NEGATIVE NEGATIVE Final    Comment: (NOTE) SARS-CoV-2 target nucleic acids are NOT DETECTED.  The SARS-CoV-2 RNA is generally detectable in upper and lower respiratory specimens during the acute phase of infection. The lowest concentration of SARS-CoV-2 viral copies this assay can detect is 250 copies / mL. A negative result does not preclude SARS-CoV-2 infection and should not be used as the sole basis for treatment or other patient management decisions.  A negative result may occur with improper specimen collection / handling, submission of specimen other than nasopharyngeal swab, presence of viral mutation(s) within the areas targeted by this assay, and inadequate number of viral copies (<250 copies / mL). A negative result must be combined with clinical observations, patient history, and epidemiological information.  Fact Sheet for Patients:   RoadLapTop.co.za  Fact Sheet for Healthcare Providers: http://kim-miller.com/  This test is not yet approved or  cleared by the [place] FDA and has been authorized for detection and/or diagnosis of SARS-CoV-2 by FDA under an Emergency Use Authorization (EUA).  This EUA will remain in effect (meaning this test can be used) for the duration of the COVID-19 declaration under Section 564(b)(1) of the Act, 21 U.S.C. section 360bbb-3(b)(1), unless the  authorization is terminated or revoked sooner.  Performed at [facility], [address] [place], [place] [address]   MRSA Next Gen by PCR, Nasal     Status: None   Collection Time: 05/19/23  5:53 PM   Specimen: Nasal Mucosa; Nasal Swab  Result Value Ref Range Status   MRSA by PCR Next Gen NOT DETECTED NOT DETECTED Final    Comment: (NOTE) The GeneXpert MRSA Assay (FDA approved for NASAL specimens only), is one component of a comprehensive MRSA colonization  surveillance program. It is not intended to diagnose MRSA infection nor to guide or monitor treatment for MRSA infections. Test performance is not FDA approved in patients less than 92 years old. Performed at [facility], [address], [place], [place] [address]      Time coordinating discharge: Over 30 minutes  SIGNED:   Charise Killian, MD  [facility] 05/24/2023, 1:18 PM Pager   If 7PM-7AM, please contact night-coverage [email or web address]

## 2023-05-25 ENCOUNTER — Other Ambulatory Visit: Payer: Self-pay

## 2023-05-25 ENCOUNTER — Inpatient Hospital Stay (HOSPITAL_COMMUNITY)
Admit: 2023-05-25 | Discharge: 2023-05-27 | DRG: 308 | Disposition: A | Discharge status: Home / Self Care | Payer: Medicare HMO | Source: Other Acute Inpatient Hospital | Attending: Cardiology | Admitting: Cardiology

## 2023-05-25 ENCOUNTER — Encounter (HOSPITAL_COMMUNITY): Payer: Self-pay | Admitting: Internal Medicine

## 2023-05-25 DIAGNOSIS — Z888 Allergy status to other drugs, medicaments and biological substances status: Secondary | ICD-10-CM

## 2023-05-25 DIAGNOSIS — Z8616 Personal history of COVID-19: Secondary | ICD-10-CM

## 2023-05-25 DIAGNOSIS — I472 Ventricular tachycardia, unspecified: Secondary | ICD-10-CM | POA: Diagnosis present

## 2023-05-25 DIAGNOSIS — Z79899 Other long term (current) drug therapy: Secondary | ICD-10-CM | POA: Diagnosis not present

## 2023-05-25 DIAGNOSIS — Z87891 Personal history of nicotine dependence: Secondary | ICD-10-CM

## 2023-05-25 DIAGNOSIS — I5023 Acute on chronic systolic (congestive) heart failure: Secondary | ICD-10-CM | POA: Diagnosis not present

## 2023-05-25 DIAGNOSIS — E785 Hyperlipidemia, unspecified: Secondary | ICD-10-CM | POA: Diagnosis present

## 2023-05-25 DIAGNOSIS — Z8249 Family history of ischemic heart disease and other diseases of the circulatory system: Secondary | ICD-10-CM

## 2023-05-25 DIAGNOSIS — Z8674 Personal history of sudden cardiac arrest: Secondary | ICD-10-CM

## 2023-05-25 DIAGNOSIS — I4729 Other ventricular tachycardia: Secondary | ICD-10-CM | POA: Diagnosis not present

## 2023-05-25 DIAGNOSIS — Z818 Family history of other mental and behavioral disorders: Secondary | ICD-10-CM

## 2023-05-25 DIAGNOSIS — I480 Paroxysmal atrial fibrillation: Secondary | ICD-10-CM | POA: Diagnosis present

## 2023-05-25 DIAGNOSIS — N183 Chronic kidney disease, stage 3 unspecified: Secondary | ICD-10-CM | POA: Diagnosis not present

## 2023-05-25 DIAGNOSIS — Z95828 Presence of other vascular implants and grafts: Secondary | ICD-10-CM

## 2023-05-25 DIAGNOSIS — N179 Acute kidney failure, unspecified: Secondary | ICD-10-CM | POA: Diagnosis present

## 2023-05-25 DIAGNOSIS — N1831 Chronic kidney disease, stage 3a: Secondary | ICD-10-CM | POA: Diagnosis not present

## 2023-05-25 DIAGNOSIS — Z8673 Personal history of transient ischemic attack (TIA), and cerebral infarction without residual deficits: Secondary | ICD-10-CM

## 2023-05-25 DIAGNOSIS — I25810 Atherosclerosis of coronary artery bypass graft(s) without angina pectoris: Secondary | ICD-10-CM | POA: Diagnosis present

## 2023-05-25 DIAGNOSIS — Z7982 Long term (current) use of aspirin: Secondary | ICD-10-CM | POA: Diagnosis not present

## 2023-05-25 DIAGNOSIS — I469 Cardiac arrest, cause unspecified: Secondary | ICD-10-CM | POA: Diagnosis not present

## 2023-05-25 DIAGNOSIS — Z79890 Hormone replacement therapy (postmenopausal): Secondary | ICD-10-CM | POA: Diagnosis not present

## 2023-05-25 DIAGNOSIS — I5022 Chronic systolic (congestive) heart failure: Secondary | ICD-10-CM | POA: Diagnosis present

## 2023-05-25 DIAGNOSIS — Z881 Allergy status to other antibiotic agents status: Secondary | ICD-10-CM

## 2023-05-25 DIAGNOSIS — E039 Hypothyroidism, unspecified: Secondary | ICD-10-CM | POA: Diagnosis not present

## 2023-05-25 DIAGNOSIS — I471 Supraventricular tachycardia, unspecified: Secondary | ICD-10-CM | POA: Diagnosis not present

## 2023-05-25 DIAGNOSIS — I255 Ischemic cardiomyopathy: Secondary | ICD-10-CM | POA: Insufficient documentation

## 2023-05-25 DIAGNOSIS — I2489 Other forms of acute ischemic heart disease: Secondary | ICD-10-CM | POA: Diagnosis present

## 2023-05-25 DIAGNOSIS — Z96653 Presence of artificial knee joint, bilateral: Secondary | ICD-10-CM | POA: Diagnosis present

## 2023-05-25 DIAGNOSIS — Z7901 Long term (current) use of anticoagulants: Secondary | ICD-10-CM

## 2023-05-25 DIAGNOSIS — Z885 Allergy status to narcotic agent status: Secondary | ICD-10-CM

## 2023-05-25 DIAGNOSIS — I130 Hypertensive heart and chronic kidney disease with heart failure and stage 1 through stage 4 chronic kidney disease, or unspecified chronic kidney disease: Secondary | ICD-10-CM | POA: Diagnosis not present

## 2023-05-25 DIAGNOSIS — I4819 Other persistent atrial fibrillation: Secondary | ICD-10-CM | POA: Diagnosis present

## 2023-05-25 DIAGNOSIS — I5020 Unspecified systolic (congestive) heart failure: Secondary | ICD-10-CM | POA: Diagnosis not present

## 2023-05-25 DIAGNOSIS — I252 Old myocardial infarction: Secondary | ICD-10-CM | POA: Diagnosis not present

## 2023-05-25 DIAGNOSIS — R55 Syncope and collapse: Secondary | ICD-10-CM | POA: Diagnosis not present

## 2023-05-25 DIAGNOSIS — I10 Essential (primary) hypertension: Secondary | ICD-10-CM | POA: Diagnosis present

## 2023-05-25 DIAGNOSIS — I462 Cardiac arrest due to underlying cardiac condition: Secondary | ICD-10-CM | POA: Diagnosis not present

## 2023-05-25 DIAGNOSIS — I2510 Atherosclerotic heart disease of native coronary artery without angina pectoris: Secondary | ICD-10-CM | POA: Diagnosis present

## 2023-05-25 DIAGNOSIS — E875 Hyperkalemia: Secondary | ICD-10-CM | POA: Diagnosis present

## 2023-05-25 DIAGNOSIS — M96A3 Multiple fractures of ribs associated with chest compression and cardiopulmonary resuscitation: Secondary | ICD-10-CM | POA: Diagnosis not present

## 2023-05-25 DIAGNOSIS — I459 Conduction disorder, unspecified: Secondary | ICD-10-CM | POA: Diagnosis not present

## 2023-05-25 LAB — BASIC METABOLIC PANEL
Anion gap: 8 (ref 5–15)
BUN: 18 mg/dL (ref 8–23)
CO2: 26 mmol/L (ref 22–32)
Calcium: 8.8 mg/dL — ABNORMAL LOW (ref 8.9–10.3)
Chloride: 105 mmol/L (ref 98–111)
Creatinine, Ser: 1.17 mg/dL (ref 0.61–1.24)
GFR, Estimated: >60 mL/min (ref >60)
Glucose, Bld: 111 mg/dL — ABNORMAL HIGH (ref 70–99)
Potassium: 4.1 mmol/L (ref 3.5–5.1)
Sodium: 139 mmol/L (ref 135–145)

## 2023-05-25 LAB — GLUCOSE, CAPILLARY
Glucose-Capillary: 103 mg/dL — ABNORMAL HIGH (ref 70–99)
Glucose-Capillary: 117 mg/dL — ABNORMAL HIGH (ref 70–99)
Glucose-Capillary: 109 mg/dL — ABNORMAL HIGH (ref 70–99)
Glucose-Capillary: 124 mg/dL — ABNORMAL HIGH (ref 70–99)

## 2023-05-25 MED ORDER — OXYCODONE HCL 5 MG PO TABS
5.0000 mg | ORAL_TABLET | ORAL | Status: DC | PRN
  Administered 2023-05-25: 5 mg via ORAL
  Filled 2023-05-25: qty 1

## 2023-05-25 MED ORDER — FUROSEMIDE 20 MG PO TABS
20.0000 mg | ORAL_TABLET | Freq: Once | ORAL | Status: AC
  Administered 2023-05-25: 20 mg via ORAL
  Filled 2023-05-25: qty 1

## 2023-05-25 MED ORDER — DOCUSATE SODIUM 100 MG PO CAPS: 100.0000 mg | ORAL_CAPSULE | Freq: Every day | ORAL | Status: DC | PRN

## 2023-05-25 MED ORDER — ORAL CARE MOUTH RINSE: 15.0000 mL | OROMUCOSAL | Status: DC | PRN

## 2023-05-25 MED ORDER — POLYETHYLENE GLYCOL 3350 17 G PO PACK: 17.0000 g | PACK | Freq: Every day | ORAL | Status: DC

## 2023-05-25 MED ORDER — ROSUVASTATIN CALCIUM 20 MG PO TABS
40.0000 mg | ORAL_TABLET | Freq: Every day | ORAL | Status: DC
  Administered 2023-05-26 – 2023-05-27 (×2): 40 mg via ORAL
  Filled 2023-05-25 (×4): qty 2

## 2023-05-25 MED ORDER — BUPROPION HCL ER (SR) 150 MG PO TB12
300.0000 mg | ORAL_TABLET | Freq: Every day | ORAL | Status: DC
  Administered 2023-05-26 – 2023-05-27 (×2): 300 mg via ORAL
  Filled 2023-05-25 (×3): qty 2

## 2023-05-25 MED ORDER — POLYETHYLENE GLYCOL 3350 17 G PO PACK
17.0000 g | PACK | Freq: Every day | ORAL | Status: DC
  Administered 2023-05-26: 17 g via ORAL
  Filled 2023-05-25 (×3): qty 1

## 2023-05-25 MED ORDER — LEVOTHYROXINE SODIUM 75 MCG PO TABS
75.0000 ug | ORAL_TABLET | Freq: Every day | ORAL | Status: DC
  Administered 2023-05-26 – 2023-05-27 (×2): 75 ug via ORAL
  Filled 2023-05-25 (×2): qty 1

## 2023-05-25 MED ORDER — NITROGLYCERIN 0.4 MG SL SUBL: 0.4000 mg | SUBLINGUAL_TABLET | SUBLINGUAL | Status: DC | PRN

## 2023-05-25 MED ORDER — ASPIRIN 81 MG PO TBEC
81.0000 mg | DELAYED_RELEASE_TABLET | Freq: Every day | ORAL | Status: DC
  Administered 2023-05-26 – 2023-05-27 (×2): 81 mg via ORAL
  Filled 2023-05-25 (×2): qty 1

## 2023-05-25 MED ORDER — METOPROLOL SUCCINATE ER 25 MG PO TB24
12.5000 mg | ORAL_TABLET | Freq: Every day | ORAL | Status: DC
  Administered 2023-05-26 – 2023-05-27 (×2): 12.5 mg via ORAL
  Filled 2023-05-25 (×4): qty 1

## 2023-05-25 MED ORDER — TRAZODONE HCL 50 MG PO TABS
25.0000 mg | ORAL_TABLET | Freq: Every evening | ORAL | Status: DC | PRN
  Filled 2023-05-25: qty 1

## 2023-05-25 MED ORDER — IPRATROPIUM-ALBUTEROL 0.5-2.5 (3) MG/3ML IN SOLN: 3.0000 mL | Freq: Four times a day (QID) | RESPIRATORY_TRACT | Status: DC | PRN

## 2023-05-25 MED ORDER — APIXABAN 5 MG PO TABS
5.0000 mg | ORAL_TABLET | Freq: Two times a day (BID) | ORAL | Status: DC
  Administered 2023-05-25 – 2023-05-27 (×4): 5 mg via ORAL
  Filled 2023-05-25 (×4): qty 1

## 2023-05-25 MED ORDER — CITALOPRAM HYDROBROMIDE 20 MG PO TABS
20.0000 mg | ORAL_TABLET | Freq: Every day | ORAL | Status: DC
  Administered 2023-05-26 – 2023-05-27 (×2): 20 mg via ORAL
  Filled 2023-05-25 (×4): qty 1

## 2023-05-25 MED ORDER — ALBUTEROL SULFATE (2.5 MG/3ML) 0.083% IN NEBU: 2.5000 mg | INHALATION_SOLUTION | Freq: Four times a day (QID) | RESPIRATORY_TRACT | Status: DC | PRN

## 2023-05-25 MED ORDER — ACETAMINOPHEN 325 MG PO TABS
650.0000 mg | ORAL_TABLET | ORAL | Status: DC | PRN
  Administered 2023-05-25 – 2023-05-26 (×2): 650 mg via ORAL
  Filled 2023-05-25 (×2): qty 2

## 2023-05-25 NOTE — Progress Notes (Addendum)
Patient Name: Stuart Harvey Date of Encounter: 05/25/2023  Primary Cardiologist: [name], MD Electrophysiologist: None  Interval Summary   Requests to go home, considerably more short of breath with movement in the room that is his baseline.  Wearing oxygen.  Wife at bedside  Inpatient Medications    Scheduled Meds:  apixaban  5 mg Oral BID   aspirin EC  81 mg Oral Daily   buPROPion ER  150 mg Oral BID   citalopram  20 mg Oral Daily   levothyroxine  75 mcg Oral Q0600   losartan  12.5 mg Oral Daily   metoprolol succinate  12.5 mg Oral Daily   polyethylene glycol  17 g Per Tube Daily   rosuvastatin  40 mg Oral Daily   Continuous Infusions:  sodium chloride Stopped (05/18/23 2300)   PRN Meds: albuterol, docusate sodium, ipratropium-albuterol, oxyCODONE, polyethylene glycol, traZODone   Vital Signs    Vitals:   05/24/23 1146 05/24/23 1608 05/25/23 0014 05/25/23 0434  BP: (!) 114/102 137/78 139/83 120/75  Pulse: 70 67 66 63  Resp: 18 18 18 16   Temp: 98 F (36.7 C) 98.4 F (36.9 C) 98.4 F (36.9 C) 97.6 F (36.4 C)  TempSrc:      SpO2: 100% 100% 97% 97%  Weight:      Height:        Intake/Output Summary (Last 24 hours) at 05/25/2023 0839 Last data filed at 05/24/2023 1930 Gross per 24 hour  Intake 240 ml  Output --  Net 240 ml   Filed Weights   05/22/23 0500 05/23/23 0500 05/24/23 0500  Weight: 95.4 kg 98 kg 96.7 kg    Physical Exam    GEN- The patient is well appearing, alert and oriented x 3 today.   Lungs- Diminished in bases, slight increased work of breathing Cardiac- Irregularly irregular rate and rhythm, no murmurs, rubs or gallops GI- soft, NT, ND, + BS Extremities- no clubbing or cyanosis. No edema  Telemetry    SR w parox AFib, rates 60-80; rare PVC (personally reviewed)  Hospital Course    Stuart Harvey is a 72 y.o. male  with a history of CAD s/p CABG, HFrEF, ICM, parox AFib on Eliquis, HTN admitted for PEA arrest. EP  requested to see for discussion of ICD   Assessment & Plan    #) PEA arrest #) syncope #) ?VT #) AFib w RVR #) LVH, ?HCM #) CAD s/p CABG On further review, patient's initial 7/4 EMS run sheet rhythm appears more likely AFib w RVR  Has h/o VT per EMS run sheet 05/2018 - this also appears to be AFib w RVR Gen cardiology briefly initiated amiodarone gtt, has since been stopped Eliquis for stroke ppx for Afib Unclear whether excessive heat combined with afib RVR caused patient's syncopal episodes, or whether patient did have syncope w VT  AFib Most recent echo findings of regional wall motion abnormality and asymmetric LVH compared to 01/2023 echo Cardiac MRI did not show LVH Cardiac MRI confirmed 11% scar burden, EP study recommended to further evaluate inducible arrhythmia  - Awaiting transport to [facility] has been requested  #) DOE Pt appears SOB this morning with diminished breath sounds in lower lung fields 20mg  PO lasix x 1   #) anxiety related to significant health care event Patient and wife expressing anxiety and feeling overwhelmed given the severity of the patient's syncopal event Discussed card-psych referral, patient agreeable - Refer to Dr. Bosie Clos at discharge  For questions or updates, please contact [facility] Please consult [email or web address] for contact info under Cardiology/STEMI.  Signed, Sherie Don, NP  05/25/2023, 8:39 AM   PEA arrest/syncope   Atrial fibrillation with a rapid ventricular response s/p DCCV ( noted on EMS arrival)   Left ventricular hypertrophy no HCM   Ischemic heart disease with prior bypass surgery and remote MI   HFmrEF acute/chronic  Plan is for transfer for EP study to look for inducible VT  Quite dyspneic this morning.  Will give a dose of Lasix.  Some interval atrial arrhythmias.  Possibly contributing.

## 2023-05-25 NOTE — Plan of Care (Signed)
  Problem: Education: Goal: Knowledge of General Education information will improve Description Including pain rating scale, medication(s)/side effects and non-pharmacologic comfort measures Outcome: Progressing   

## 2023-05-25 NOTE — Plan of Care (Signed)

## 2023-05-25 NOTE — Plan of Care (Signed)
°  Problem: Coping: °Goal: Level of anxiety will decrease °Outcome: Progressing °  °

## 2023-05-26 ENCOUNTER — Encounter (HOSPITAL_COMMUNITY)
Disposition: A | Discharge status: Home / Self Care | Payer: Self-pay | Source: Other Acute Inpatient Hospital | Attending: Internal Medicine

## 2023-05-26 ENCOUNTER — Ambulatory Visit (HOSPITAL_COMMUNITY): Admission: RE | Admit: 2023-05-26 | Payer: Medicare HMO | Source: Home / Self Care | Admitting: Cardiology

## 2023-05-26 ENCOUNTER — Encounter (HOSPITAL_COMMUNITY): Payer: Self-pay | Admitting: Internal Medicine

## 2023-05-26 ENCOUNTER — Inpatient Hospital Stay (HOSPITAL_COMMUNITY): Payer: Medicare HMO | Admitting: Certified Registered"

## 2023-05-26 DIAGNOSIS — I471 Supraventricular tachycardia, unspecified: Secondary | ICD-10-CM | POA: Diagnosis not present

## 2023-05-26 DIAGNOSIS — I5022 Chronic systolic (congestive) heart failure: Secondary | ICD-10-CM | POA: Diagnosis not present

## 2023-05-26 DIAGNOSIS — I130 Hypertensive heart and chronic kidney disease with heart failure and stage 1 through stage 4 chronic kidney disease, or unspecified chronic kidney disease: Secondary | ICD-10-CM

## 2023-05-26 DIAGNOSIS — I459 Conduction disorder, unspecified: Secondary | ICD-10-CM

## 2023-05-26 DIAGNOSIS — N183 Chronic kidney disease, stage 3 unspecified: Secondary | ICD-10-CM

## 2023-05-26 DIAGNOSIS — R55 Syncope and collapse: Secondary | ICD-10-CM | POA: Diagnosis not present

## 2023-05-26 DIAGNOSIS — I5020 Unspecified systolic (congestive) heart failure: Secondary | ICD-10-CM | POA: Diagnosis not present

## 2023-05-26 HISTORY — PX: ELECTROPHYSIOLOGY STUDY: EP1205

## 2023-05-26 LAB — CBC
HCT: 36 % — ABNORMAL LOW (ref 39.0–52.0)
Hemoglobin: 11.8 g/dL — ABNORMAL LOW (ref 13.0–17.0)
MCH: 30.3 pg (ref 26.0–34.0)
MCHC: 32.8 g/dL (ref 30.0–36.0)
MCV: 92.3 fL (ref 80.0–100.0)
Platelets: 182 10*3/uL (ref 150–400)
RBC: 3.9 MIL/uL — ABNORMAL LOW (ref 4.22–5.81)
RDW: 14.6 % (ref 11.5–15.5)
WBC: 10.6 10*3/uL — ABNORMAL HIGH (ref 4.0–10.5)
nRBC: 0 % (ref 0.0–0.2)

## 2023-05-26 LAB — BASIC METABOLIC PANEL
Anion gap: 7 (ref 5–15)
BUN: 18 mg/dL (ref 8–23)
CO2: 26 mmol/L (ref 22–32)
Calcium: 8.8 mg/dL — ABNORMAL LOW (ref 8.9–10.3)
Chloride: 104 mmol/L (ref 98–111)
Creatinine, Ser: 1.25 mg/dL — ABNORMAL HIGH (ref 0.61–1.24)
GFR, Estimated: >60 mL/min (ref >60)
Glucose, Bld: 111 mg/dL — ABNORMAL HIGH (ref 70–99)
Potassium: 3.9 mmol/L (ref 3.5–5.1)
Sodium: 137 mmol/L (ref 135–145)

## 2023-05-26 LAB — GLUCOSE, CAPILLARY
Glucose-Capillary: 109 mg/dL — ABNORMAL HIGH (ref 70–99)
Glucose-Capillary: 93 mg/dL (ref 70–99)

## 2023-05-26 LAB — SURGICAL PCR SCREEN
MRSA, PCR: NEGATIVE
Staphylococcus aureus: POSITIVE — AB

## 2023-05-26 SURGERY — ELECTROPHYSIOLOGY STUDY
Anesthesia: Monitor Anesthesia Care

## 2023-05-26 MED ORDER — SODIUM CHLORIDE 0.9% FLUSH: 3.0000 mL | Freq: Two times a day (BID) | INTRAVENOUS | Status: DC

## 2023-05-26 MED ORDER — CEFAZOLIN SODIUM-DEXTROSE 2-4 GM/100ML-% IV SOLN: 2.0000 g | INTRAVENOUS | Status: DC

## 2023-05-26 MED ORDER — ONDANSETRON HCL 4 MG/2ML IJ SOLN: 4.0000 mg | Freq: Four times a day (QID) | INTRAMUSCULAR | Status: DC | PRN

## 2023-05-26 MED ORDER — SODIUM CHLORIDE 0.9 % IV SOLN: INTRAVENOUS | Status: DC

## 2023-05-26 MED ORDER — AMIODARONE HCL 200 MG PO TABS
400.0000 mg | ORAL_TABLET | Freq: Two times a day (BID) | ORAL | Status: DC
  Administered 2023-05-26 – 2023-05-27 (×3): 400 mg via ORAL
  Filled 2023-05-26 (×3): qty 2

## 2023-05-26 MED ORDER — AMIODARONE HCL 200 MG PO TABS: 200.0000 mg | ORAL_TABLET | Freq: Every day | ORAL | Status: DC

## 2023-05-26 MED ORDER — FENTANYL CITRATE (PF) 100 MCG/2ML IJ SOLN
INTRAMUSCULAR | Status: DC | PRN
  Administered 2023-05-26 (×2): 25 ug via INTRAVENOUS
  Administered 2023-05-26: 50 ug via INTRAVENOUS

## 2023-05-26 MED ORDER — LIDOCAINE-EPINEPHRINE 1 %-1:100000 IJ SOLN
INTRAMUSCULAR | Status: AC
  Filled 2023-05-26: qty 1

## 2023-05-26 MED ORDER — AMIODARONE HCL 200 MG PO TABS: 200.0000 mg | ORAL_TABLET | Freq: Two times a day (BID) | ORAL | Status: DC

## 2023-05-26 MED ORDER — CHLORHEXIDINE GLUCONATE CLOTH 2 % EX PADS
6.0000 | MEDICATED_PAD | Freq: Every day | CUTANEOUS | Status: DC
  Administered 2023-05-27: 6 via TOPICAL

## 2023-05-26 MED ORDER — MUPIROCIN 2 % EX OINT
1.0000 | TOPICAL_OINTMENT | Freq: Two times a day (BID) | CUTANEOUS | Status: DC
  Administered 2023-05-26 – 2023-05-27 (×2): 1 via NASAL
  Filled 2023-05-26: qty 22

## 2023-05-26 MED ORDER — CHLORHEXIDINE GLUCONATE 4 % EX SOLN
60.0000 mL | Freq: Once | CUTANEOUS | Status: AC
  Administered 2023-05-26: 4 via TOPICAL
  Filled 2023-05-26: qty 60

## 2023-05-26 MED ORDER — PROPOFOL 10 MG/ML IV BOLUS
INTRAVENOUS | Status: DC | PRN
Start: 2023-05-26 — End: 2023-05-26
  Administered 2023-05-26: 70 mg via INTRAVENOUS

## 2023-05-26 MED ORDER — MIDAZOLAM HCL 2 MG/2ML IJ SOLN
INTRAMUSCULAR | Status: DC | PRN
  Administered 2023-05-26: 1 mg via INTRAVENOUS
  Administered 2023-05-26 (×2): .5 mg via INTRAVENOUS

## 2023-05-26 MED ORDER — BUPIVACAINE HCL (PF) 0.25 % IJ SOLN
INTRAMUSCULAR | Status: AC
  Filled 2023-05-26: qty 30

## 2023-05-26 MED ORDER — SODIUM CHLORIDE 0.9 % IV SOLN: 80.0000 mg | INTRAVENOUS | Status: DC

## 2023-05-26 MED ORDER — SODIUM CHLORIDE 0.9 % IV SOLN: 250.0000 mL | INTRAVENOUS | Status: DC | PRN

## 2023-05-26 MED ORDER — BUPIVACAINE HCL (PF) 0.25 % IJ SOLN
INTRAMUSCULAR | Status: DC | PRN
  Administered 2023-05-26: 20 mL

## 2023-05-26 MED ORDER — SODIUM CHLORIDE 0.9% FLUSH: 3.0000 mL | INTRAVENOUS | Status: DC | PRN

## 2023-05-26 MED ORDER — HEPARIN (PORCINE) IN NACL 1000-0.9 UT/500ML-% IV SOLN
INTRAVENOUS | Status: DC | PRN
  Administered 2023-05-26: 500 mL

## 2023-05-26 MED ORDER — PROPOFOL 500 MG/50ML IV EMUL: INTRAVENOUS | Status: DC | PRN

## 2023-05-26 MED ORDER — CHLORHEXIDINE GLUCONATE 4 % EX SOLN
60.0000 mL | Freq: Once | CUTANEOUS | Status: DC
  Filled 2023-05-26 (×2): qty 60

## 2023-05-26 MED ORDER — DEXTROMETHORPHAN POLISTIREX ER 30 MG/5ML PO SUER
30.0000 mg | Freq: Two times a day (BID) | ORAL | Status: DC | PRN
  Administered 2023-05-26: 30 mg via ORAL
  Filled 2023-05-26 (×2): qty 5

## 2023-05-26 SURGICAL SUPPLY — 9 items
BAG SNAP BAND KOVER 36X36 (MISCELLANEOUS) IMPLANT
CATH JOSEPH QUAD ALLRED 6F REP (CATHETERS) IMPLANT
CATH POLARIS X REPROCESSED (CATHETERS) IMPLANT
CLOSURE PERCLOSE PROSTYLE (VASCULAR PRODUCTS) IMPLANT
PACK EP LATEX FREE (CUSTOM PROCEDURE TRAY) ×1
PACK EP LF (CUSTOM PROCEDURE TRAY) ×1 IMPLANT
PAD DEFIB RADIO PHYSIO CONN (PAD) ×1 IMPLANT
SHEATH PINNACLE 8F 10CM (SHEATH) IMPLANT
SHEATH PROBE COVER 6X72 (BAG) IMPLANT

## 2023-05-26 NOTE — TOC CM/SW Note (Addendum)
Transition of Care [facility]) - Inpatient Brief Assessment   Patient Details  Name: Stuart Harvey MRN: [id number] Date of Birth: 1951/04/07  Transition of Care [facility]) CM/SW Contact:    Gala Lewandowsky, RN Phone Number: 05/26/2023, 1:17 PM   Clinical Narrative: Patient transferred from [facility] for syncopal episode for EP study. [facility] sent referral to [facility] for [facility] Services- PT/OT. Referral is pending with [facility] and Liaison states they will continue to follow. Case Manager will continue to follow for additional transition of care needs.   1611 05-26-23 Case Manager received notification that the patent will need Life Vest for home. Clinicals submitted to [facility] and will await insurance verification. [employer] Rep Irving Burton has the number to the unit to call for approval. No further needs identified at this time.   Transition of Care Asessment: Insurance and Status: Insurance coverage has been reviewed Patient has primary care physician: Yes Prior/Current Home Services: No current home services Readmission risk has been reviewed: Yes Transition of care needs: transition of care needs identified, TOC will continue to follow

## 2023-05-26 NOTE — Transfer of Care (Signed)
Immediate Anesthesia Transfer of Care Note  Patient: Stuart Harvey  Procedure(s) Performed: ELECTROPHYSIOLOGY STUDY  Patient Location: PACU  Anesthesia Type:MAC  Level of Consciousness: awake, alert , and oriented  Airway & Oxygen Therapy: Patient Spontanous Breathing  Post-op Assessment: Report given to RN and Post -op Vital signs reviewed and stable  Post vital signs: Reviewed and stable  Last Vitals:  Vitals Value Taken Time  BP 111/84 05/26/23 1500  Temp 36.7 C 05/26/23 1500  Pulse 61 05/26/23 1501  Resp 17 05/26/23 1501  SpO2 92 % 05/26/23 1501  Vitals shown include unfiled device data.  Last Pain:  Vitals:   05/26/23 1500  TempSrc: Temporal  PainSc: 0-No pain      Patients Stated Pain Goal: 0 (05/25/23 2000)  Complications: No notable events documented.

## 2023-05-26 NOTE — Progress Notes (Addendum)
  Pt had inducible VT on EP study 05/26/2023  With syncopal spells and arrest will need ICD.   Given recent cardioversion not felt safe to proceed with ICD today.   Will plan Lifevest and outpatient ICD.   Anticipated length of need 4-6 weeks.   Have forwarded to RN and Scheduler for ICD After 8/5 (4 weeks from [facility])  cMRI  1.  Moderately reduced LV systolic function.  LVEF = 38% 2.  Lateral wall LV Basal-mid subendocardial scar/LGE. 3.  LGE/scar accounts for 12.77g (11%) of total myocardial mass. 4.  Normal RV function. 5.  Findings consistent with prior LV infarct. 6.  Ischemic cardiomyopathy.  Casimiro Needle [address]" [place], [place]  05/26/2023 3:56 PM

## 2023-05-26 NOTE — Anesthesia Postprocedure Evaluation (Signed)
Anesthesia Post Note  Patient: Stuart Harvey  Procedure(s) Performed: ELECTROPHYSIOLOGY STUDY     Patient location during evaluation: PACU Anesthesia Type: MAC Level of consciousness: awake and alert Pain management: pain level controlled Vital Signs Assessment: post-procedure vital signs reviewed and stable Respiratory status: spontaneous breathing, nonlabored ventilation, respiratory function stable and patient connected to nasal cannula oxygen Cardiovascular status: stable and blood pressure returned to baseline Postop Assessment: no apparent nausea or vomiting Anesthetic complications: no   No notable events documented.  Last Vitals:  Vitals:   05/26/23 1530 05/26/23 1535  BP: 125/81   Pulse: (!) 59 62  Resp: 19 17  Temp: 36.8 C   SpO2: 91% 91%    Last Pain:  Vitals:   05/26/23 1535  TempSrc:   PainSc: 0-No pain                 Collene Schlichter

## 2023-05-26 NOTE — Anesthesia Preprocedure Evaluation (Addendum)
Anesthesia Evaluation  Patient identified by MRN, date of birth, ID band Patient awake    Reviewed: Allergy & Precautions, NPO status , Patient's Chart, lab work & pertinent test results, reviewed documented beta blocker date and time   Airway Mallampati: II  TM Distance: >3 FB Neck ROM: Full    Dental  (+) Teeth Intact, Dental Advisory Given   Pulmonary former smoker   Pulmonary exam normal breath sounds clear to auscultation       Cardiovascular hypertension, Pt. on home beta blockers and Pt. on medications + CAD, + CABG (2008) and + Peripheral Vascular Disease (AAA s/p EVAR)  Normal cardiovascular exam+ dysrhythmias Ventricular Tachycardia  Rhythm:Regular Rate:Normal  Echo 05/19/23: 1. Left ventricular ejection fraction, by estimation, is 40 to 45%. The  left ventricle has mildly decreased function. The left ventricle  demonstrates regional wall motion abnormalities (inferolateral wall  hypokinesis). There is moderate asymmetric left  ventricular hypertrophy of the septal segment. Left ventricular diastolic  parameters are consistent with Grade I diastolic dysfunction (impaired  relaxation).   2. Right ventricular systolic function is normal. The right ventricular  size is normal. There is normal pulmonary artery systolic pressure. The  estimated right ventricular systolic pressure is 29.0 mmHg.   3. The mitral valve is normal in structure. No evidence of mitral valve  regurgitation. No evidence of mitral stenosis.   4. Tricuspid valve regurgitation is moderate.   5. The aortic valve is normal in structure. Aortic valve regurgitation is  not visualized. No aortic stenosis is present.   6. There is borderline dilatation of the aortic root, measuring 40 mm.   7. The inferior vena cava is normal in size with greater than 50%  respiratory variability, suggesting right atrial pressure of 3 mmHg.     Neuro/Psych  PSYCHIATRIC  DISORDERS  Depression    negative neurological ROS     GI/Hepatic Neg liver ROS,GERD  Medicated,,  Endo/Other  Hypothyroidism    Renal/GU Renal InsufficiencyRenal disease     Musculoskeletal  (+) Arthritis ,    Abdominal   Peds  Hematology  (+) Blood dyscrasia (Eliquis), anemia   Anesthesia Other Findings Day of surgery medications reviewed with the patient.  Reproductive/Obstetrics                             Anesthesia Physical Anesthesia Plan  ASA: 4  Anesthesia Plan: MAC   Post-op Pain Management: Tylenol PO (pre-op)*   Induction: Intravenous  PONV Risk Score and Plan: 1 and TIVA and Treatment may vary due to age or medical condition  Airway Management Planned: Natural Airway and Simple Face Mask  Additional Equipment:   Intra-op Plan:   Post-operative Plan:   Informed Consent: I have reviewed the patients History and Physical, chart, labs and discussed the procedure including the risks, benefits and alternatives for the proposed anesthesia with the patient or authorized representative who has indicated his/her understanding and acceptance.     Dental advisory given  Plan Discussed with: CRNA  Anesthesia Plan Comments:         Anesthesia Quick Evaluation

## 2023-05-26 NOTE — Progress Notes (Addendum)
Patient Name: Stuart Harvey Date of Encounter: 05/26/2023  Primary Cardiologist: [name], MD Electrophysiologist: Dr. Lalla Brothers  Interval Summary   The patient is doing well today.  Reports concerns over intubation if necessary for procedure. States he it was frightening in the past when he woke up intubated. No difficulty with extubation per pt report.    At this time, the patient denies chest pain, shortness of breath, or any new concerns.  Inpatient Medications    Scheduled Meds:  apixaban  5 mg Oral BID   aspirin EC  81 mg Oral Daily   buPROPion ER  300 mg Oral Daily   chlorhexidine  60 mL Topical Once   chlorhexidine  60 mL Topical Once   citalopram  20 mg Oral Daily   gentamicin (GARAMYCIN) 80 mg in sodium chloride 0.9 % 500 mL irrigation  80 mg Irrigation To SSTC   levothyroxine  75 mcg Oral Q0600   metoprolol succinate  12.5 mg Oral Daily   polyethylene glycol  17 g Oral Daily   rosuvastatin  40 mg Oral Daily   Continuous Infusions:  sodium chloride      ceFAZolin (ANCEF) IV     PRN Meds: acetaminophen, docusate sodium, ipratropium-albuterol, nitroGLYCERIN, mouth rinse, oxyCODONE, traZODone   Vital Signs    Vitals:   05/25/23 1935 05/25/23 2316 05/26/23 0430 05/26/23 0713  BP: 112/63 139/83 (!) 128/93 136/85  Pulse: 68 66 (!) 58 60  Resp: 18 18 18 17   Temp: 98.8 F (37.1 C) 98.9 F (37.2 C) 97.7 F (36.5 C) 97.9 F (36.6 C)  TempSrc: Oral Oral Oral Oral  SpO2: 100% 95% 96% 98%  Weight:   94.1 kg   Height:       No intake or output data in the 24 hours ending 05/26/23 0757 Filed Weights   05/25/23 1700 05/26/23 0430  Weight: 97.7 kg 94.1 kg    Physical Exam    GEN- The patient is well appearing, alert and oriented x 3 today.   Lungs- Clear to ausculation bilaterally, normal work of breathing Cardiac- Regular rate and rhythm, no murmurs, rubs or gallops GI- soft, NT, ND, + BS Extremities- no clubbing or cyanosis. No edema  Telemetry     SR 60's, occ PVC (personally reviewed)    EP Information / Studies Reviewed   Studies:  cMRI 05/23/23 > moderately reduced LV systolic function, LVEF 38%, lateral wall LV Basal-mid subendocardial scar/LGE. LGE/scar accounts for 12.77g (11%) of total myocardial mass. Normal RV function. Findings consistent with prior LV infarct. Ischemic cardiomyopathy. TTE 05/19/23 > LVEF 40-45%, mildly decreased function, LV regional wall motion abnormalities (inferolateral wall hypokinesis), moderate asymmetric LV hypertrophy of the septal segment, grade I DD, RV systolic function normal, moderate TR   Hospital Course    Stuart Harvey is a 72 y.o. male with a history of CAD s/p CABG, HFrEF, ICM, parox AFib on Eliquis, HTN who is being seen 05/25/2023 for the evaluation of recurrent syncope and concern for cardiac arrest. Was admitted to [facility] and found to have 11% scar burden on cMRI.   Assessment & Plan    PEA Arrest  Syncope  cMRI with LGE, EF 38%. Hx of VT in 2019, AFwRVR -pending EP study 7/12   -if study negative, would plan for loop. If positive will have to plan for intervention 4 weeks post DCCV & life vest -tele monitoring   Atrial Fibrillation with RVR s/p DCCV  -hx of recent cardioversion precludes intervention  if warranted after EP study -continue anticoagulation post procedure  LV Hypertrophy, no HCM Ischemic Heart Disease s/p MI & CABG Acute on Chronic HFmrEF -ASA -continue toprol, crestor  -s/p lasix dosing on 7/11   Dyspnea  -normal effort at rest, no I/O's documented  -continue O2, goal 88-94% given hx of tobacco abuse     For questions or updates, please contact [facility] Please consult [email or web address] for contact info under Cardiology/STEMI.  Signed, Canary Brim, MSN, APRN, NP-C, AGACNP-BC [facility] - Electrophysiology  05/26/2023, 7:57 AM

## 2023-05-26 NOTE — Anesthesia Procedure Notes (Signed)
Procedure Name: MAC Date/Time: 05/26/2023 2:13 PM  Performed by: Orlin Hilding, CRNAPre-anesthesia Checklist: Emergency Drugs available, Patient identified, Suction available and Patient being monitored Patient Re-evaluated:Patient Re-evaluated prior to induction Oxygen Delivery Method: Simple face mask

## 2023-05-27 DIAGNOSIS — I469 Cardiac arrest, cause unspecified: Secondary | ICD-10-CM | POA: Diagnosis not present

## 2023-05-27 DIAGNOSIS — I255 Ischemic cardiomyopathy: Secondary | ICD-10-CM | POA: Insufficient documentation

## 2023-05-27 DIAGNOSIS — I2510 Atherosclerotic heart disease of native coronary artery without angina pectoris: Secondary | ICD-10-CM | POA: Insufficient documentation

## 2023-05-27 LAB — BASIC METABOLIC PANEL
Anion gap: 9 (ref 5–15)
BUN: 18 mg/dL (ref 8–23)
CO2: 23 mmol/L (ref 22–32)
Calcium: 8.9 mg/dL (ref 8.9–10.3)
Chloride: 105 mmol/L (ref 98–111)
Creatinine, Ser: 1.24 mg/dL (ref 0.61–1.24)
GFR, Estimated: >60 mL/min (ref >60)
Glucose, Bld: 101 mg/dL — ABNORMAL HIGH (ref 70–99)
Potassium: 3.9 mmol/L (ref 3.5–5.1)
Sodium: 137 mmol/L (ref 135–145)

## 2023-05-27 LAB — CBC
HCT: 37 % — ABNORMAL LOW (ref 39.0–52.0)
Hemoglobin: 12.1 g/dL — ABNORMAL LOW (ref 13.0–17.0)
MCH: 30.3 pg (ref 26.0–34.0)
MCHC: 32.7 g/dL (ref 30.0–36.0)
MCV: 92.7 fL (ref 80.0–100.0)
Platelets: 213 10*3/uL (ref 150–400)
RBC: 3.99 MIL/uL — ABNORMAL LOW (ref 4.22–5.81)
RDW: 14.6 % (ref 11.5–15.5)
WBC: 11.2 10*3/uL — ABNORMAL HIGH (ref 4.0–10.5)
nRBC: 0 % (ref 0.0–0.2)

## 2023-05-27 MED ORDER — AMIODARONE HCL 200 MG PO TABS: 200.0000 mg | ORAL_TABLET | Freq: Every day | ORAL | 5 refills | Status: DC

## 2023-05-27 MED ORDER — AMIODARONE HCL 200 MG PO TABS: ORAL_TABLET | ORAL | 0 refills | Status: DC

## 2023-05-27 MED ORDER — ENTRESTO 24-26 MG PO TABS: 1.0000 | ORAL_TABLET | Freq: Two times a day (BID) | ORAL | Status: DC

## 2023-05-27 MED ORDER — NITROGLYCERIN 0.4 MG SL SUBL: 0.4000 mg | SUBLINGUAL_TABLET | SUBLINGUAL | 2 refills | Status: AC | PRN

## 2023-05-27 NOTE — TOC Progression Note (Addendum)
Transition of Care [facility]) - Progression Note    Patient Details  Name: Stuart Harvey MRN: [id number] Date of Birth: 02-05-1951  Transition of Care [facility]) CM/SW Contact  Ronny Bacon, RN Phone Number: 05/27/2023, 8:17 AM  Clinical Narrative:  Call received from [facility] with [employer] [phone], needs life vest order to include length of time needed, then she can arrange for life vest placement on patient. Reached out to floor RN to see what cardiologist is on service for today.   0845: Provider note and order emailed to [facility] with [employer], [email or web address].         Expected Discharge Plan and Services                                               Social Determinants of Health (SDOH) Interventions SDOH Screenings   Food Insecurity: No Food Insecurity (05/25/2023)  Housing: Low Risk  (05/25/2023)  Transportation Needs: No Transportation Needs (05/25/2023)  Utilities: Not At Risk (05/25/2023)  Alcohol Screen: Low Risk  (08/09/2022)  Depression (PHQ2-9): Low Risk  (02/28/2023)  Financial Resource Strain: Low Risk  (08/09/2022)  Physical Activity: Insufficiently Active (08/09/2022)  Social Connections: Socially Integrated (08/09/2022)  Stress: No Stress Concern Present (08/09/2022)  Tobacco Use: High Risk (05/26/2023)    Readmission Risk Interventions    05/20/2023    3:29 PM  Readmission Risk Prevention Plan  Transportation Screening Complete  PCP or Specialist Appt within 5-7 Days Complete  Home Care Screening Complete  Medication Review (RN CM) Complete

## 2023-05-27 NOTE — Discharge Summary (Signed)
Discharge Summary    Patient ID: Stuart Harvey MRN: [id number]; DOB: 10-17-1951  Admit date: 05/25/2023 Discharge date: 05/27/2023  PCP:  Marjie Skiff, NP   [facility] Providers Cardiologist:  [name], MD   {   Discharge Diagnoses    Principal Problem:   Cardiac arrest [facility]) Active Problems:   VT (ventricular tachycardia) (HCC)   Hyperlipidemia   Hypothyroid   Paroxysmal atrial fibrillation (HCC)   Hypertension   Acute renal failure superimposed on stage 3 chronic kidney disease (HCC)   Heart failure with mildly reduced ejection fraction (HFmrEF) (HCC)   CAD (coronary artery disease)   Ischemic cardiomyopathy    Diagnostic Studies/Procedures    Echocardiogram 05/19/2023: Impressions: 1. Left ventricular ejection fraction, by estimation, is 40 to 45%. The  left ventricle has mildly decreased function. The left ventricle  demonstrates regional wall motion abnormalities (inferolateral wall  hypokinesis). There is moderate asymmetric left  ventricular hypertrophy of the septal segment. Left ventricular diastolic  parameters are consistent with Grade I diastolic dysfunction (impaired  relaxation).   2. Right ventricular systolic function is normal. The right ventricular  size is normal. There is normal pulmonary artery systolic pressure. The  estimated right ventricular systolic pressure is 29.0 mmHg.   3. The mitral valve is normal in structure. No evidence of mitral valve  regurgitation. No evidence of mitral stenosis.   4. Tricuspid valve regurgitation is moderate.   5. The aortic valve is normal in structure. Aortic valve regurgitation is  not visualized. No aortic stenosis is present.   6. There is borderline dilatation of the aortic root, measuring 40 mm.   7. The inferior vena cava is normal in size with greater than 50%  respiratory variability, suggesting right atrial pressure of 3 mmHg.  _____________  Cardiac MRI  05/23/2023: Impressions: 1.  Moderately reduced LV systolic function.  LVEF = 38% 2.  Lateral wall LV Basal-mid subendocardial scar/LGE. 3.  LGE/scar accounts for 12.77g (11%) of total myocardial mass. 4.  Normal RV function. 5.  Findings consistent with prior LV infarct. 6.  Ischemic cardiomyopathy. _______________  EP Study 05/26/2023: Conclusions: 1. Sinus rhythm upon presentation.  2. Sustained, monomorphic VT inducible at EP study 3. AV conduction appears nodal. 4. No early apparent complications.  5. Start Amiodarone 6. Plan for outpatient ICD implant. Will bridge with a LifeVest until he can return for ICD implant.    History of Present Illness     Stuart Harvey is a 72 y.o. male with a history of CAD s/p CABG x4 in 2008 at [facility] with occluded distal SVG-OM3 but patent LIMA-LAD, SVG-D2, and SVG-RPDA on most recent cardiac catheterization in 07/2022, ischemic cardiomyopathy/ chronic HFrEF with EF of 35-40% on Echo in 07/2022, paroxysmal atrial fibrillation on Eliquis, infrarenal AAA s/p open repair in 08/2007, right iliac artery aneurysm s/p EVAR in 07/2021, hypertension, hyperlipidemia, hypothyroidism, CKD stage III, GERD, anemia, and depression who transferred from [facility] to [facility] on 05/25/2023 for and EP Study after being admitted for a cardiac arrest on 05/18/2023.   Patient was admitted in 05/2018 after he was found down on his back porch with possible seizure like activity. Per EMR, EMS found patient to be in VT at that time and required defibrillation. Echo showed LVEF of 50-55%. Diagnostic cath showed borderline disease in the SVG to [facility] but otherwise grafts were patent. Ultimately presentation was felt to be secondary to recent stroke. In 06/2022, patient reported worsening dyspnea and  left sided chest pain. Myoview was ordered and showed LVEF of 38% with evidence of prior MI. Subsequent Echo confirmed depressed EF of 35-40% with global hypokinesis. This led to a right/ left cardiac  catheterization in 07/2022 which showed severe multivessel native CAD with occlusion of SVG to OM3 but patent LIMA to LAD, SVG to D2, and SVG to RPDA. Continued medical therapy was recommended at that time.   Patient had been in his usual state of health until 05/18/2023. He was out in his garage when his wife went out to ask him to get ready to go to their son's cookout when she noticed him sitting on the floor holding his head. He said he was feeling very dizzy. He attempted to stand up but then fell forward and apparently lost consciousness. His wife said she thought he was turning blue. She did not assess for a pulse but did begin CPR after calling EMS. She continued CPR uninterrupted until EMS arrived in about 5-6 minutes. At that point, EMS continued CPR. Patient was reportedly pulseless on EMS arrival and was "blue from the chest up." ROSC was achieved after an additional 4 minutes of CPR. No medications were administered. He was placed on a non-rebreather mask and taken to [facility] where he required intubation on arrival.   Initial EKG showed atrial fibrillation with rate of 180 bpm and he was hypotensive. High-sensitivity troponin mildly elevated at 21 >> 40 >> 1010 >>116 consistent with demand ischemic. BNP 513.6. Initial labs also remarkable for elevated WBC of 18.2, hyperkalemia of 5.7, and AKI with creatinine of 2.01. Imagining including head, chest , abdominal, and pelvic CTs did not show any acute findings other than acute rib fractures from CPR. He was admitted to ICU and Cardiology was consulted for further evaluation.  He was started on IV Amiodarone. Home CHF medications were held and he was started on IV Heparin in lieu of Eliquis. He was able to be extubated on 05/19/2023. Echo showed LVEF of 40-445% with inferolateral wall hypokinesis and grade 1 diastolic dysfunction. He was briefly diuresed with IV lasix and also treated for possible aspiration pneumonia. He underwent a successful DCCV with return  of sinus rhythm on 05/22/2023. EP was consulted and it was felt that VT could not be excluded as the cause of his cardiac arrest. Cardiac MRI was performed on 05/24/2023 which showed a moderately reduced LVEF of 38% with lateral wall LV basal-mid subendocardial scar/ LGE. LGE/ scar accounted for 12.77g (11%) of total myocardial mass. Findings were consistent with prior LV infarct. He was then transferred to [facility] for EP study.   Hospital Course     Cardiac Arrest VT Patient was initially admitted at [facility] on 05/18/2023 after a witnessed cardiac arrest. Upon EMS arrival, he did not have a pulse. ROSC was achieved after about a total of 10 minutes of CPR in the field. Initial EKG showed rapid atrial fibrillation with rate in the 180s. However, there was concern that VT may of been the cause of this arrest given similar episode in 2019 where VT was noted in the field. Cardiac MRI at [facility] showed LVEF of 38% with lateral wall LV basal-mid subendocardial scar/ LGE. LGE/ scar accounted for 12.77g (11%) of total myocardial mass. Findings were consistent with prior LV infarct. He was transferred to [facility] and underwent EP Study on 05/26/2023 which showed inducible sustained monomorphic VT. Given inability to safely pause anticoagulation given recent cardioversion, LifeVest was placed with plans to bring  him back for ICD after 4 weeks of uninterrupted anticoagulation. He was discharged on a PO Amiodarone taper (400mg  twice daily x5 >> 200mg  twice daily x5 days >>  200mg  once daily). He was instructed not to drive until given permission to do so by EP.    Atrial Fibrillation with RVR Patient has a history of atrial fibrillation. Initial EKG after ROSC showed rapid atrial fibrillation with rates in the 180s. He was briefly placed on IV Amiodarone and underwent successful DCCV on 05/22/2023 at [facility] with restoration of sinus rhythm. He will be discharge on PO Amiodaron as above as well as home Toprol-XL 12.5mg  daily.  Continue Eliquis 5mg  twice daily.   CAD s/p CABG History of remote CABG x4 in 2008. Last cath in 07/2022 showed occluded distal SVG to OM3 but remainder of grafts patent. High-sensitivity troponin mildly elevated at [facility] consistent with demand ischemia. Cardiac MRI showed findings consistent with prior LV infarct as stated above with lateral wall LV basal-mid subendocardial scar/ LGE. No aspirin given need for full anticoagulation. Continue Crestor and Zetia.  Ischemic Cardiomyopathy Chronic HFrEF BNP elevated at 513 at [facility]. Echo showed LVEF of 40-45% with inferolateral wall hypokinesis, moderate asymmetric LVH of the septal segment, and grade 1 diastolic dysfunction. He did receive some IV diuresis. Home Entresto and Toprol-XL were initially held due to renal function and soft BP. However, BP has improved and renal function as returned to baseline. He will be discharged on home Entresto 24-26mg  twice daily and Toprol-XL 12.5mg  daily. He has not been on Spironolactone or SGLT2 inhibitor in the past due to renal function. Recommend repeat BMET at follow-up visit.   Hypertension Patient initially hypotensive on arrival to [facility] after cardiac arrest. BP has since improved so will restart home Entresto and Toprol-XL. Advised patient to monitor BP closely at home with this.   Hyperlipidemia Continue Crestor 40mg  daily and Zetia 10mg  daily.   AKI on CKD Stage III Creatinine peaked at 2.01 on admission in setting of cardiac arrest. Baseline around 1.4. Improved back to baseline and was 1.24 at discharge. Recommend repeat BMET at follow-up visit.  Hypothyroidism Continue home Synthroid.   Of note, please see Discharge Summary from 05/24/2023 for any additional information regarding hospitalization at [facility] before transfer to [facility].  Patient was seen and examined by Dr. Lalla Brothers today and felt to be stable for discharge after placement of LiveVest. Will send message to our office to help arrange follow-up.  Medications as below.       Did the patient have an acute coronary syndrome (MI, NSTEMI, STEMI, etc) this admission?:  No.   The elevated Troponin was due to the acute medical illness (demand ischemia).   _____________  Discharge Vitals Blood pressure (!) 141/72, pulse 65, temperature 98.1 F (36.7 C), temperature source Oral, resp. rate 18, height 6' (1.829 m), weight 92.7 kg, SpO2 98%.  Filed Weights   05/25/23 1700 05/26/23 0430 05/27/23 0351  Weight: 97.7 kg 94.1 kg 92.7 kg    Labs & Radiologic Studies    CBC Recent Labs    05/26/23 0316 05/27/23 0241  WBC 10.6* 11.2*  HGB 11.8* 12.1*  HCT 36.0* 37.0*  MCV 92.3 92.7  PLT 182 213   Basic Metabolic Panel Recent Labs    82/95/62 0316 05/27/23 0241  NA 137 137  K 3.9 3.9  CL 104 105  CO2 26 23  GLUCOSE 111* 101*  BUN 18 18  CREATININE 1.25* 1.24  CALCIUM 8.8* 8.9  Liver Function Tests No results for input(s): "AST", "ALT", "ALKPHOS", "BILITOT", "PROT", "ALBUMIN" in the last 72 hours. No results for input(s): "LIPASE", "AMYLASE" in the last 72 hours. High Sensitivity Troponin:   Recent Labs  Lab 05/18/23 1627 05/18/23 1820 05/18/23 2247 05/19/23 0211  TROPONINIHS 21* 40* 101* 116*    BNP Invalid input(s): "POCBNP" D-Dimer No results for input(s): "DDIMER" in the last 72 hours. Hemoglobin A1C No results for input(s): "HGBA1C" in the last 72 hours. Fasting Lipid Panel No results for input(s): "CHOL", "HDL", "LDLCALC", "TRIG", "CHOLHDL", "LDLDIRECT" in the last 72 hours. Thyroid Function Tests No results for input(s): "TSH", "T4TOTAL", "T3FREE", "THYROIDAB" in the last 72 hours.  Invalid input(s): "FREET3" _____________  EP STUDY  Result Date: 05/27/2023 CONCLUSIONS: 1. Sinus rhythm upon presentation. 2. Sustained, monomorphic VT inducible at EP study 3. AV conduction appears nodal. 4. No early apparent complications. 5. Start Amiodarone 6. Plan for outpatient ICD implant. Will bridge with a  LifeVest until he can return for ICD implant.   MR CARDIAC MORPHOLOGY W WO CONTRAST  Result Date: 05/24/2023 CLINICAL DATA:  Cardiac arrest, hx of ischemic cardiomyopathy EXAM: CARDIAC MRI TECHNIQUE: The patient was scanned on a 1.5 Tesla Siemens magnet. A dedicated cardiac coil was used. Functional imaging was done using Fiesta sequences. 2,3, and 4 chamber views were done to assess for RWMA's. Modified Simpson's rule using a short axis stack was used to calculate an ejection fraction on a dedicated work [employer]. The patient received 12 ml cc of Gadavist. After 10 minutes inversion recovery sequences were used to assess for infiltration and scar tissue. Velocity flow mapping performed in the ascending aorta and main pulmonary artery. CONTRAST:  12 ml cc  of Gadavist FINDINGS: 1. Normal left ventricular size, mild LV thickness, moderately reduced LV systolic function (LVEF = 38%). There is global hypokinesis. There is basal-mid subendocardial late gadolinium enhancement (25% wall thickness) in the lateral wall of the left ventricular myocardium. LGE/scar accounts for 12.77g (11%) of total myocardial mass. LVEDV: 186 ml LVESV: 115 ml SV: 71 ml CO: 5.2 L/min Myocardial mass: 115g 2. Mildly dilated right ventricular size, normal thickness, normal systolic function (RVEF = 56%). There are no regional wall motion abnormalities. 3.  Mildly dilated left and right atrial size. 4. Normal size of the aortic root, ascending aorta and pulmonary artery. 5.  No significant valvular abnormalities. 6.  Normal pericardium.  No pericardial effusion. IMPRESSION: 1.  Moderately reduced LV systolic function.  LVEF = 38% 2.  Lateral wall LV Basal-mid subendocardial scar/LGE. 3.  LGE/scar accounts for 12.77g (11%) of total myocardial mass. 4.  Normal RV function. 5.  Findings consistent with prior LV infarct. 6.  Ischemic cardiomyopathy. Electronically Signed   By: Debbe Odea M.D.   On: 05/24/2023 09:40    MR CARDIAC VELOCITY FLOW MAP  Result Date: 05/24/2023 CLINICAL DATA:  Cardiac arrest, hx of ischemic cardiomyopathy EXAM: CARDIAC MRI TECHNIQUE: The patient was scanned on a 1.5 Tesla Siemens magnet. A dedicated cardiac coil was used. Functional imaging was done using Fiesta sequences. 2,3, and 4 chamber views were done to assess for RWMA's. Modified Simpson's rule using a short axis stack was used to calculate an ejection fraction on a dedicated work [employer]. The patient received 12 ml cc of Gadavist. After 10 minutes inversion recovery sequences were used to assess for infiltration and scar tissue. Velocity flow mapping performed in the ascending aorta and main pulmonary artery. CONTRAST:  12 ml cc  of Gadavist FINDINGS: 1. Normal left ventricular size, mild LV thickness, moderately reduced LV systolic function (LVEF = 38%). There is global hypokinesis. There is basal-mid subendocardial late gadolinium enhancement (25% wall thickness) in the lateral wall of the left ventricular myocardium. LGE/scar accounts for 12.77g (11%) of total myocardial mass. LVEDV: 186 ml LVESV: 115 ml SV: 71 ml CO: 5.2 L/min Myocardial mass: 115g 2. Mildly dilated right ventricular size, normal thickness, normal systolic function (RVEF = 56%). There are no regional wall motion abnormalities. 3.  Mildly dilated left and right atrial size. 4. Normal size of the aortic root, ascending aorta and pulmonary artery. 5.  No significant valvular abnormalities. 6.  Normal pericardium.  No pericardial effusion. IMPRESSION: 1.  Moderately reduced LV systolic function.  LVEF = 38% 2.  Lateral wall LV Basal-mid subendocardial scar/LGE. 3.  LGE/scar accounts for 12.77g (11%) of total myocardial mass. 4.  Normal RV function. 5.  Findings consistent with prior LV infarct. 6.  Ischemic cardiomyopathy. Electronically Signed   By: Debbe Odea M.D.   On: 05/24/2023 09:40   DG Chest Port 1 View  Result Date:  05/20/2023 CLINICAL DATA:  Acute respiratory failure and hypoxia. EXAM: PORTABLE CHEST 1 VIEW COMPARISON:  05/19/2023 FINDINGS: Status post median sternotomy and CABG procedure. Fracture of the proximal 3 sternotomy wires are again noted. Right IJ catheter is noted with tip at the superior cavoatrial junction. Cardiomediastinal contours are stable. Interval decrease in lung volumes with increased opacities in both lower lobes. No signs of interstitial edema or pleural effusion. IMPRESSION: Interval decrease in lung volumes with worsening aeration to both lower lobes. Electronically Signed   By: Signa Kell M.D.   On: 05/20/2023 07:21   ECHOCARDIOGRAM COMPLETE  Result Date: 05/19/2023    ECHOCARDIOGRAM REPORT   Patient Name:   DAGEM LAZER Date of Exam: 05/19/2023 Medical Rec #:  [id number]       Height:       72.0 in Accession #:    [id number]      Weight:       208.3 lb Date of Birth:  [birth year]       BSA:          2.168 m Patient Age:    71 years        BP:           82/70 mmHg Patient Gender: M               HR:           106 bpm. Exam Location:  [facility] Procedure: 2D Echo, Cardiac Doppler and Color Doppler STAT ECHO Indications:     Cardiac Arrest I46.9  History:         Patient has prior history of Echocardiogram examinations, most                  recent 01/18/2023. CAD; Risk Factors:Hypertension.  Sonographer:     Cristela Blue Referring Phys:  [id number] KHABIB DGAYLI Diagnosing Phys: Julien Nordmann MD  Sonographer Comments: Echo performed with patient supine and on artificial respirator. IMPRESSIONS  1. Left ventricular ejection fraction, by estimation, is 40 to 45%. The left ventricle has mildly decreased function. The left ventricle demonstrates regional wall motion abnormalities (inferolateral wall hypokinesis). There is moderate asymmetric left ventricular hypertrophy of the septal segment. Left ventricular diastolic parameters are consistent with Grade I diastolic dysfunction (impaired relaxation).  2.  Right ventricular systolic function is normal.  The right ventricular size is normal. There is normal pulmonary artery systolic pressure. The estimated right ventricular systolic pressure is 29.0 mmHg.  3. The mitral valve is normal in structure. No evidence of mitral valve regurgitation. No evidence of mitral stenosis.  4. Tricuspid valve regurgitation is moderate.  5. The aortic valve is normal in structure. Aortic valve regurgitation is not visualized. No aortic stenosis is present.  6. There is borderline dilatation of the aortic root, measuring 40 mm.  7. The inferior vena cava is normal in size with greater than 50% respiratory variability, suggesting right atrial pressure of 3 mmHg. FINDINGS  Left Ventricle: Left ventricular ejection fraction, by estimation, is 40 to 45%. The left ventricle has mildly decreased function. The left ventricle demonstrates regional wall motion abnormalities. The left ventricular internal cavity size was normal in size. There is moderate asymmetric left ventricular hypertrophy of the septal segment. Left ventricular diastolic parameters are consistent with Grade I diastolic dysfunction (impaired relaxation). Right Ventricle: The right ventricular size is normal. No increase in right ventricular wall thickness. Right ventricular systolic function is normal. There is normal pulmonary artery systolic pressure. The tricuspid regurgitant velocity is 2.45 m/s, and  with an assumed right atrial pressure of 5 mmHg, the estimated right ventricular systolic pressure is 29.0 mmHg. Left Atrium: Left atrial size was normal in size. Right Atrium: Right atrial size was normal in size. Pericardium: There is no evidence of pericardial effusion. Mitral Valve: The mitral valve is normal in structure. No evidence of mitral valve regurgitation. No evidence of mitral valve stenosis. Tricuspid Valve: The tricuspid valve is normal in structure. Tricuspid valve regurgitation is moderate . No evidence of  tricuspid stenosis. Aortic Valve: The aortic valve is normal in structure. Aortic valve regurgitation is not visualized. No aortic stenosis is present. Aortic valve mean gradient measures 1.5 mmHg. Aortic valve peak gradient measures 3.0 mmHg. Aortic valve area, by VTI measures 3.92 cm. Pulmonic Valve: The pulmonic valve was normal in structure. Pulmonic valve regurgitation is not visualized. No evidence of pulmonic stenosis. Aorta: The aortic root is normal in size and structure. There is borderline dilatation of the aortic root, measuring 40 mm. Venous: The inferior vena cava is normal in size with greater than 50% respiratory variability, suggesting right atrial pressure of 3 mmHg. IAS/Shunts: No atrial level shunt detected by color flow Doppler.  LEFT VENTRICLE PLAX 2D LVIDd:         5.20 cm     Diastology LVIDs:         4.40 cm     LV e' medial:    7.94 cm/s LV PW:         0.90 cm     LV E/e' medial:  5.4 LV IVS:        1.80 cm     LV e' lateral:   12.50 cm/s LVOT diam:     2.30 cm     LV E/e' lateral: 3.4 LV SV:         37 LV SV Index:   17 LVOT Area:     4.15 cm  LV Volumes (MOD) LV vol d, MOD A2C: 66.3 ml LV vol d, MOD A4C: 87.7 ml LV vol s, MOD A2C: 33.3 ml LV vol s, MOD A4C: 43.1 ml LV SV MOD A2C:     33.0 ml LV SV MOD A4C:     87.7 ml LV SV MOD BP:      41.3 ml RIGHT VENTRICLE RV  Basal diam:  3.80 cm RV Mid diam:    2.60 cm RV S prime:     10.90 cm/s TAPSE (M-mode): 2.3 cm LEFT ATRIUM           Index        RIGHT ATRIUM           Index LA diam:      1.80 cm 0.83 cm/m   RA Area:     23.60 cm LA Vol (A2C): 10.2 ml 4.71 ml/m   RA Volume:   74.10 ml  34.18 ml/m LA Vol (A4C): 45.4 ml 20.94 ml/m  AORTIC VALVE AV Area (Vmax):    3.22 cm AV Area (Vmean):   3.10 cm AV Area (VTI):     3.92 cm AV Vmax:           86.50 cm/s AV Vmean:          62.900 cm/s AV VTI:            0.094 m AV Peak Grad:      3.0 mmHg AV Mean Grad:      1.5 mmHg LVOT Vmax:         67.10 cm/s LVOT Vmean:        47.000 cm/s LVOT VTI:           0.088 m LVOT/AV VTI ratio: 0.94  AORTA Ao Root diam: 3.96 cm MITRAL VALVE               TRICUSPID VALVE MV Area (PHT): 6.83 cm    TR Peak grad:   24.0 mmHg MV Decel Time: 111 msec    TR Vmax:        245.00 cm/s MV E velocity: 43.00 cm/s MV A velocity: 63.40 cm/s  SHUNTS MV E/A ratio:  0.68        Systemic VTI:  0.09 m                            Systemic Diam: 2.30 cm Julien Nordmann MD Electronically signed by Julien Nordmann MD Signature Date/Time: 05/19/2023/12:45:29 PM    Final    DG Chest Port 1 View  Result Date: 05/19/2023 CLINICAL DATA:  Acute respiratory failure with hypoxia. Status post cardiac arrest. EXAM: PORTABLE CHEST 1 VIEW COMPARISON:  05/18/2023 FINDINGS: There is a right IJ catheter with tip in the projection of the cavoatrial junction. Interval extubation. Signs of previous median sternotomy and CABG procedure. As noted previously there is fracture of the proximal 3 sternotomy wires. Cardiomediastinal contours are unremarkable. Trace interstitial edema with thickening of the peripheral septal lines in the left lung. No pleural effusion or airspace consolidation. IMPRESSION: 1. Interval extubation. 2. Trace interstitial edema. Electronically Signed   By: Signa Kell M.D.   On: 05/19/2023 12:30   DG Chest Port 1 View  Result Date: 05/18/2023 CLINICAL DATA:  Central line EXAM: PORTABLE CHEST 1 VIEW COMPARISON:  05/18/2023 FINDINGS: Endotracheal tube tip is about 3 cm superior to carina. Esophageal tube tip below the diaphragm but incompletely visualized. Post sternotomy changes. Right IJ central venous catheter tip over the SVC. No visible pneumothorax. Stable cardiomediastinal silhouette. Patchy atelectasis left base. IMPRESSION: Right IJ central venous catheter tip over the SVC. No visible pneumothorax. Electronically Signed   By: Jasmine Pang M.D.   On: 05/18/2023 22:45   CT Angio Chest/Abd/Pel for Dissection W and/or Wo Contrast  Result Date: 05/18/2023 CLINICAL DATA:  Post  cardiac arrest EXAM: CT ANGIOGRAPHY CHEST, ABDOMEN AND PELVIS TECHNIQUE: Non-contrast CT of the chest was initially obtained. Multidetector CT imaging through the chest, abdomen and pelvis was performed using the standard protocol during bolus administration of intravenous contrast. Multiplanar reconstructed images and MIPs were obtained and reviewed to evaluate the vascular anatomy. RADIATION DOSE REDUCTION: This exam was performed according to the departmental dose-optimization program which includes automated exposure control, adjustment of the mA and/or kV according to patient size and/or use of iterative reconstruction technique. CONTRAST:  OMNIPAQUE IOHEXOL 350 MG/ML SOLN COMPARISON:  None Available. FINDINGS: CTA CHEST FINDINGS VASCULAR Aorta: Satisfactory opacification of the aorta. Normal contour and caliber of the thoracic aorta. No evidence of aneurysm, dissection, or other acute aortic pathology. Moderate mixed calcific atherosclerosis Cardiovascular: No evidence of pulmonary embolism on limited non-tailored examination. Cardiomegaly. Three-vessel coronary artery calcifications status post median sternotomy and CABG. No pericardial effusion. Review of the MIP images confirms the above findings. NON VASCULAR Mediastinum/Nodes: No enlarged mediastinal, hilar, or axillary lymph nodes. Thyroid gland, trachea, and esophagus demonstrate no significant findings. Lungs/Pleura: Endotracheal intubation, tip above the carina. Dependent bibasilar scarring or atelectasis. No pleural effusion or pneumothorax. Musculoskeletal: No chest wall abnormality. Numerous nondisplaced fractures of the anterior ribs (series 6, image 61). Review of the MIP images confirms the above findings. CTA ABDOMEN AND PELVIS FINDINGS VASCULAR Status post aortobiiliac stent endograft repair of abdominal aortic aneurysm. No acute findings. Moderate mixed calcific atherosclerosis. Standard branching pattern of the abdominal aorta with  solitary bilateral renal arteries. Review of the MIP images confirms the above findings. NON-VASCULAR Hepatobiliary: No solid liver abnormality is seen. No gallstones, gallbladder wall thickening, or biliary dilatation. Pancreas: Unremarkable. No pancreatic ductal dilatation or surrounding inflammatory changes. Spleen: Normal in size without significant abnormality. Adrenals/Urinary Tract: Adrenal glands are unremarkable. Numerous bilateral renal cortical cysts, more numerous on the left, without obvious solid mass or suspicious contrast enhancement. No hydronephrosis. Kidneys are otherwise normal, without renal calculi, solid lesion, or hydronephrosis. Bladder is unremarkable. Stomach/Bowel: Esophagogastric tube with tip and side port below the diaphragm. Stomach is within normal limits. Appendix appears normal. No evidence of bowel wall thickening, distention, or inflammatory changes. Sigmoid diverticulosis Lymphatic: No enlarged abdominal or pelvic lymph nodes. Reproductive: No mass or other significant abnormality. Other: No abdominal wall hernia or abnormality. No ascites. Musculoskeletal: No acute osseous findings. IMPRESSION: 1. No evidence of thoracic aortic aneurysm, dissection, or other acute aortic pathology. 2. Status post aortobiiliac stent endograft repair of the abdominal aorta without acute findings. 3. No evidence of pulmonary embolism on limited non-tailored examination. 4. Numerous nondisplaced fractures of the anterior ribs. No pneumothorax. 5. Cardiomegaly and coronary artery disease status post median sternotomy and CABG. 6. Sigmoid diverticulosis without evidence of acute diverticulitis. Aortic Atherosclerosis (ICD10-I70.0). Electronically Signed   By: Jearld Lesch M.D.   On: 05/18/2023 17:46   CT Head Wo Contrast  Result Date: 05/18/2023 CLINICAL DATA:  Cardiac arrest. EXAM: CT HEAD WITHOUT CONTRAST TECHNIQUE: Contiguous axial images were obtained from the base of the skull through the  vertex without intravenous contrast. RADIATION DOSE REDUCTION: This exam was performed according to the departmental dose-optimization program which includes automated exposure control, adjustment of the mA and/or kV according to patient size and/or use of iterative reconstruction technique. COMPARISON:  MRI brain 11/23/2022 FINDINGS: Brain: There is no evidence for acute hemorrhage, hydrocephalus, mass lesion, or abnormal extra-axial fluid collection. No definite CT evidence for acute infarction. Vascular: No hyperdense vessel or unexpected calcification. Skull:  No evidence for fracture. No worrisome lytic or sclerotic lesion. Sinuses/Orbits: The visualized paranasal sinuses and mastoid air cells are clear. Visualized portions of the globes and intraorbital fat are unremarkable. Other: Gas is identified in venous anatomy of the lower head bilaterally, likely reflecting IV access. IMPRESSION: 1. No acute intracranial abnormality. 2. Gas in superficial venous anatomy of the lower head bilaterally, likely reflecting IV access. Electronically Signed   By: Kennith Center M.D.   On: 05/18/2023 17:35   DG Chest Port 1 View  Result Date: 05/18/2023 CLINICAL DATA:  Intubation EXAM: PORTABLE CHEST 1 VIEW COMPARISON:  06/03/2018 FINDINGS: Endotracheal tube with the tip 6.6 cm above the carina. Nasogastric tube with the tip projecting over the stomach. No focal consolidation. No pleural effusion or pneumothorax. Heart and mediastinal contours are unremarkable. Prior CABG. No acute osseous abnormality. IMPRESSION: 1. Endotracheal tube with the tip 6.6 cm above the carina. Electronically Signed   By: Elige Ko M.D.   On: 05/18/2023 17:11   Disposition   Patient is being discharged home today in good condition.  Follow-up Plans & Appointments     Follow-up Information     Lanier Prude, MD Follow up.   Specialties: Cardiology, Radiology Why: Our office will call you to schedule a hospital follow-up If you do  not hear from [place] within 2 business days, please call office to schedule. Contact information: [address] [place] [address] [phone]                   Discharge Medications   Allergies as of 05/27/2023       Reactions   Benzodiazepines    Paradoxical agitation and delirium   Codeine Nausea Only   Tetracycline Rash        Medication List     STOP taking these medications    aspirin EC 81 MG tablet   losartan 25 MG tablet Commonly known as: COZAAR       TAKE these medications    amiodarone 200 MG tablet Commonly known as: Pacerone Take 2 tablets (400mg ) twice daily for 5 days, then 1 tablet (200mg ) twice daily for 5 days, and then 1 table (200mg ) once daily.   apixaban 5 MG Tabs tablet Commonly known as: Eliquis Take 1 tablet (5 mg total) by mouth 2 (two) times daily.   buPROPion 200 MG 12 hr tablet Commonly known as: WELLBUTRIN SR Take 1 tablet (200 mg total) by mouth 2 (two) times daily.   Cholecalciferol 50 MCG (2000 UT) Caps Take 1 capsule by mouth daily.   citalopram 20 MG tablet Commonly known as: CELEXA Take 1 tablet (20 mg total) by mouth daily.   Entresto 24-26 MG Generic drug: sacubitril-valsartan Take 1 tablet by mouth 2 (two) times daily.   ezetimibe 10 MG tablet Commonly known as: ZETIA Take 1 tablet (10 mg total) by mouth daily.   levothyroxine 75 MCG tablet Commonly known as: SYNTHROID Take 1 tablet (75 mcg total) by mouth daily.   metoprolol succinate 25 MG 24 hr tablet Commonly known as: Toprol XL Take 0.5 tablets (12.5 mg total) by mouth daily.   nitroGLYCERIN 0.4 MG SL tablet Commonly known as: NITROSTAT Place 1 tablet (0.4 mg total) under the tongue every 5 (five) minutes x 3 doses as needed for chest pain.   omeprazole 20 MG capsule Commonly known as: PRILOSEC Take 1 capsule (20 mg total) by mouth daily.   rosuvastatin 40 MG tablet Commonly  known as: CRESTOR Take 1 tablet (40 mg total) by  mouth daily.               Durable Medical Equipment  (From admission, onward)           Start     Ordered   05/27/23 0854  For home use only DME Vest life vest  Once       Comments: For up to 3 months   05/27/23 0853               Outstanding Labs/Studies   Repeat BMET at follow-up visit.   Duration of Discharge Encounter   Greater than 30 minutes including physician time.  Signed, Corrin Parker, PA-C 05/27/2023, 10:22 PM

## 2023-05-27 NOTE — Discharge Instructions (Addendum)
Medication Changes: - START Amiodarone taper: You will take 2 tablets (400mg ) twice daily for 5 days, then 1 tablet (200mg  daily) twice daily for 5 day, and then 1 tablet (200mg ) once daily until instructed to stop. I have sent the first 30 days to the [facility] here in [place] that is open 24/7. The address is [address], [place], [place] [address]. Refills have been sent to your usual Pharmacy at [facility]. - Otherwise, continue all home medications.  **DO NOT DRIVE until being given the okay by Dr. Lalla Brothers or his team.** Our office will call you to schedule a hospital follow-up.

## 2023-05-27 NOTE — Progress Notes (Signed)
   Rounding Note    Patient Name: Stuart Harvey Date of Encounter: 05/27/2023  [facility] Cardiologist: [name], MD   Subjective   NAEO. EP study yesterday with inducible VT.  Vital Signs    Vitals:   05/26/23 2338 05/27/23 0351 05/27/23 0357 05/27/23 0747  BP: 112/89  (!) 142/88 (!) 137/91  Pulse: 75 67 (!) 104 64  Resp: 18  20 18   Temp: 98.9 F (37.2 C)  98.1 F (36.7 C) 98.1 F (36.7 C)  TempSrc: Oral  Oral Oral  SpO2: 100% 98% 99% 98%  Weight:  92.7 kg    Height:        Intake/Output Summary (Last 24 hours) at 05/27/2023 0832 Last data filed at 05/27/2023 0646 Gross per 24 hour  Intake 640 ml  Output 1101 ml  Net -461 ml      05/27/2023    3:51 AM 05/26/2023    4:30 AM 05/25/2023    5:00 PM  Last 3 Weights  Weight (lbs) 204 lb 6.4 oz 207 lb 8 oz 215 lb 6.2 oz  Weight (kg) 92.715 kg 94.121 kg 97.7 kg      Telemetry    Personally Reviewed  ECG    Personally Reviewed  Physical Exam   GEN: No acute distress.   Cardiac: RRR, no murmurs, rubs, or gallops.  Respiratory: Clear to auscultation bilaterally. Psych: Normal affect   Assessment & Plan    #VT Likely the cause of his prior syncopal episodes. EF 37% on cMR with a scar. Monomorphic sustained VT inducible during EP study on 05/26/2023. He will need an ICD. Given I am unable to safely pause his OAC due to recent DCCV, plan for this to be done as an outpatient. Will cover him in the interim with a LifeVest.   #AF S/p recent DCCV Continue OAC uninterrupted for 4 weeks   #CABG #CAD #HFrEF EF 37%. Cont GDMT.       Sheria Lang T. Lalla Brothers, MD, [facility], [facility] Cardiac Electrophysiology

## 2023-05-29 ENCOUNTER — Encounter (HOSPITAL_COMMUNITY): Payer: Self-pay | Admitting: Cardiology

## 2023-05-29 ENCOUNTER — Ambulatory Visit: Payer: Self-pay

## 2023-05-29 ENCOUNTER — Telehealth: Payer: Self-pay

## 2023-05-29 NOTE — Transitions of Care (Post Inpatient/ED Visit) (Signed)
05/29/2023  Name: Stuart Harvey MRN: [id number] DOB: Nov 20, 1950  Today's TOC FU Call Status: Today's TOC FU Call Status:: Successful TOC FU Call Competed TOC FU Call Complete Date: 05/29/23  Transition Care Management Follow-up Telephone Call Date of Discharge: 05/27/23 Discharge Facility: [name]) Type of Discharge: Inpatient Admission Primary Inpatient Discharge Diagnosis:: syncope How have you been since you were released from the hospital?: Better Any questions or concerns?: No  Items Reviewed: Did you receive and understand the discharge instructions provided?: Yes Medications obtained,verified, and reconciled?: Yes (Medications Reviewed) Any new allergies since your discharge?: No Dietary orders reviewed?: Yes Do you have support at home?: Yes People in Home: spouse  Medications Reviewed Today: Medications Reviewed Today     Reviewed by Karena Addison, LPN (Licensed Practical Nurse) on 05/29/23 at 1027  Med List Status: <None>   Medication Order Taking? Sig Documenting Provider Last Dose Status Informant  amiodarone (PACERONE) 200 MG tablet [id number]  Take 2 tablets (400mg ) twice daily for 5 days, then 1 tablet (200mg ) twice daily for 5 days, and then 1 table (200mg ) once daily. Corrin Parker, PA-C  Active   apixaban (ELIQUIS) 5 MG TABS tablet [id number] No Take 1 tablet (5 mg total) by mouth 2 (two) times daily. Sondra Barges, PA-C 05/18/2023 Active Self  buPROPion (WELLBUTRIN SR) 200 MG 12 hr tablet [id number] No Take 1 tablet (200 mg total) by mouth 2 (two) times daily. Aura Dials T, NP 05/18/2023 Active Self  Cholecalciferol 50 MCG (2000 UT) CAPS [id number] No Take 1 capsule by mouth daily.  [provider] 05/18/2023 Active Self  citalopram (CELEXA) 20 MG tablet [id number]  Take 1 tablet (20 mg total) by mouth daily. Charise Killian, MD  Active Self  ezetimibe (ZETIA) 10 MG tablet [id number] No Take 1 tablet (10 mg total) by mouth daily. Aura Dials T, NP 05/18/2023 Active Self  levothyroxine (SYNTHROID) 75 MCG tablet [id number] No Take 1 tablet (75 mcg total) by mouth daily. Aura Dials T, NP 05/18/2023 Active Self  metoprolol succinate (TOPROL XL) 25 MG 24 hr tablet [id number] No Take 0.5 tablets (12.5 mg total) by mouth daily. Sondra Barges, PA-C 05/18/2023 Active Self  nitroGLYCERIN (NITROSTAT) 0.4 MG SL tablet [id number]  Place 1 tablet (0.4 mg total) under the tongue every 5 (five) minutes x 3 doses as needed for chest pain. Corrin Parker, PA-C  Active   omeprazole (PRILOSEC) 20 MG capsule [id number] No Take 1 capsule (20 mg total) by mouth daily. Aura Dials T, NP 05/18/2023 Active Self  rosuvastatin (CRESTOR) 40 MG tablet [id number] No Take 1 tablet (40 mg total) by mouth daily. Iran Ouch, MD 05/18/2023 Active Self  sacubitril-valsartan (ENTRESTO) 24-26 MG [id number]  Take 1 tablet by mouth 2 (two) times daily. Corrin Parker, PA-C  Active             Home Care and Equipment/Supplies: Were Home Health Services Ordered?: Yes Name of Home Health Agency:: Home Health Has Agency set up a time to come to your home?: No Any new equipment or medical supplies ordered?: NA  Functional Questionnaire: Do you need assistance with bathing/showering or dressing?: No Do you need assistance with meal preparation?: No Do you need assistance with eating?: No Do you have difficulty maintaining continence: No Do you need assistance with getting out of bed/getting out of a chair/moving?: No Do you have difficulty managing or taking your medications?: No  Follow up appointments reviewed: PCP Follow-up  appointment confirmed?: Yes Date of PCP follow-up appointment?: 06/12/23 Follow-up Provider: [facility] Follow-up appointment confirmed?: No Reason Specialist Follow-Up Not Confirmed: Patient has Specialist Provider Number and will Call for Appointment Do you need transportation to your follow-up appointment?:  No Do you understand care options if your condition(s) worsen?: Yes-patient verbalized understanding    SIGNATURE Karena Addison, LPN [facility] Nurse Health Advisor Direct Dial [phone]

## 2023-05-29 NOTE — Telephone Encounter (Signed)
 Noted  

## 2023-05-29 NOTE — Chronic Care Management (AMB) (Signed)
   05/29/2023  JORGEN WOLFINGER 1951/06/07 [id number]   Reason for Encounter: Patient is not currently enrolled in the CCM program. CCM status changed to previously enrolled  Alto Denver RN, MSN, CCM RN Care Manager  Chronic Care Management Direct Number: [phone]

## 2023-05-30 ENCOUNTER — Other Ambulatory Visit: Payer: Self-pay | Admitting: Nurse Practitioner

## 2023-05-30 ENCOUNTER — Telehealth: Payer: Self-pay | Admitting: Nurse Practitioner

## 2023-05-30 ENCOUNTER — Ambulatory Visit: Payer: Self-pay

## 2023-05-30 NOTE — Telephone Encounter (Signed)
Called patient he stated that he wished wouldn't have said anything to PT about his chest because he has it taken care of.  He said it wasn't something that is persistent or constant so he refused appointment right now, said that he would call us if he needs [place].

## 2023-05-30 NOTE — Telephone Encounter (Signed)
Patient made aware of Provider's recommendations and verbalized understanding.  Patient has an appt on 7/29 at 1pm with pcp.

## 2023-05-30 NOTE — Telephone Encounter (Signed)
Verbal orders given per Jolene 

## 2023-05-30 NOTE — Telephone Encounter (Signed)
Stuart Harvey PT, called and said that the pain has been having chest pain after CPR was preformed. Patient would like to know what he can take for pain at this time. Selinda Eon I would call patient to get more information. Stuart Harvey requested a callback if any new orders are needed.   Chief Complaint: Chest pain Symptoms: mild chest discomfort  Frequency: comes and goes 3/10 on pain scale Pertinent Negatives: Patient denies SOB, severe pain, Headache, numbness, weakness Disposition: [] ED /[] Urgent Care (no appt availability in office) / [] Appointment(In office/virtual)/ []  [place] [facility]/ [x] Home Care/ [] Refused Recommended Disposition /[] [place] [facility]/ []  Follow-up with PCP Additional Notes: Patient stated he feels fine he just wanted to make sure it was okay to take pain medication when he is chest hurts. Patient states his ribs were injured during CPR in the hospital and they hurt when he moves around like getting out of the bed. Once he is up and walking he feels better. Patient stated he has no pain at this time and is going out for lunch. Advised patient to call back if symptoms get worse or go to the ED. Patient verbalized understanding.   Reason for Disposition  Chest pain(s) lasting a few seconds  Answer Assessment - Initial Assessment Questions 1. LOCATION: "Where does it hurt?"       Chest area rib  2. RADIATION: "Does the pain go anywhere else?" (e.g., into neck, jaw, arms, back)     No 3. ONSET: "When did the chest pain begin?" (Minutes, hours or days)      After CPR  4. PATTERN: "Does the pain come and go, or has it been constant since it started?"  "Does it get worse with exertion?"      Comes and goes  5. DURATION: "How long does it last" (e.g., seconds, minutes, hours)     Only last when moving around 6. SEVERITY: "How bad is the pain?"  (e.g., Scale 1-10; mild, moderate, or severe)    - MILD (1-3): doesn't interfere with normal activities     - MODERATE (4-7):  interferes with normal activities or awakens from sleep    - SEVERE (8-10): excruciating pain, unable to do any normal activities       3/10 7. CARDIAC RISK FACTORS: "Do you have any history of heart problems or risk factors for heart disease?" (e.g., angina, prior heart attack; diabetes, high blood pressure, high cholesterol, smoker, or strong family history of heart disease)     Yes 8. PULMONARY RISK FACTORS: "Do you have any history of lung disease?"  (e.g., blood clots in lung, asthma, emphysema, birth control pills)     No 9. CAUSE: "What do you think is causing the chest pain?"     My ribs were hurt when they did cpr on me in the hospital 10. OTHER SYMPTOMS: "Do you have any other symptoms?" (e.g., dizziness, nausea, vomiting, sweating, fever, difficulty breathing, cough)       no  Protocols used: Chest Pain-A-AH

## 2023-05-30 NOTE — Telephone Encounter (Signed)
Copied from CRM [id number]. Topic: Quick Communication - Home Health Verbal Orders >> May 30, 2023  1:36 PM Everette C wrote: Caller/Agency: Irving Burton / [facility]  Callback Number: [phone] Requesting OT/PT/Skilled Nursing/Social Work/Speech Therapy: PT  Frequency: 2w2 1w2

## 2023-05-31 ENCOUNTER — Telehealth: Payer: Self-pay | Admitting: Nurse Practitioner

## 2023-05-31 NOTE — Telephone Encounter (Signed)
Home Health Verbal Orders - Caller/Agency: Morrie Sheldon from [facility] Callback Number: [phone] Requesting OT Frequency: 1x4

## 2023-06-01 ENCOUNTER — Other Ambulatory Visit: Payer: Self-pay | Admitting: Nurse Practitioner

## 2023-06-01 NOTE — Telephone Encounter (Signed)
Verbal orders given per Jolene 

## 2023-06-02 NOTE — Telephone Encounter (Signed)
Requested Prescriptions  Pending Prescriptions Disp Refills   ezetimibe (ZETIA) 10 MG tablet [Pharmacy Med Name: EZETIMIBE 10 MG TABLET] 30 tablet 0    Sig: Take 1 tablet (10 mg total) by mouth daily.     Cardiovascular:  Antilipid - Sterol Transport Inhibitors Failed - 06/01/2023  3:43 PM      Failed - AST in normal range and within 360 days    AST  Date Value Ref Range Status  05/18/2023 43 (H) 15 - 41 U/L Final   SGOT(AST)  Date Value Ref Range Status  12/13/2014 30 15 - 37 Unit/L Final         Failed - Lipid Panel in normal range within the last 12 months    Cholesterol, Total  Date Value Ref Range Status  01/11/2023 146 100 - 199 mg/dL Final   Cholesterol Piccolo, Waived  Date Value Ref Range Status  12/18/2015 159 <200 mg/dL Final    Comment:                            Desirable                <200                         Borderline High      200- 239                         High                     >239    LDL Chol Calc (NIH)  Date Value Ref Range Status  01/11/2023 76 0 - 99 mg/dL Final   HDL  Date Value Ref Range Status  01/11/2023 52 >39 mg/dL Final   Triglycerides  Date Value Ref Range Status  05/19/2023 120 <150 mg/dL Final    Comment:    Performed at [facility], [address] [place], [place] [address]   Triglycerides Piccolo,Waived  Date Value Ref Range Status  12/18/2015 166 (H) <150 mg/dL Final    Comment:                            Normal                   <150                         Borderline High     150 - 199                         High                200 - 499                         Very High                >499          Passed - ALT in normal range and within 360 days    ALT  Date Value Ref Range Status  05/18/2023 20 0 - 44 U/L Final   SGPT (ALT)  Date Value Ref Range Status  12/13/2014 20 14 - 63 U/L Final  Passed - Patient is not pregnant      Passed - Valid encounter within last 12 months    Recent  Outpatient Visits           4 months ago Benign hypertension with CKD (chronic kidney disease) stage III (HCC)   [place] [facility] [place], Corrie Dandy T, NP   7 months ago Breast pain, left   [place] [facility] Gabriel Cirri, NP   1 year ago Hyperlipidemia, unspecified hyperlipidemia type   [place] [facility] Vigg, Avanti, MD   1 year ago Screen for colon cancer   [place] [facility] Vigg, Avanti, MD   1 year ago Screening for colon cancer   [place] [facility] Loura Pardon, MD       Future Appointments             In 1 week [place], Dorie Rank, NP [facility], [facility]   In 1 month [place], Dorie Rank, NP [facility], [facility]

## 2023-06-06 ENCOUNTER — Telehealth: Payer: Self-pay | Admitting: Cardiovascular Disease

## 2023-06-06 NOTE — Telephone Encounter (Signed)
Pt c/o BP issue: STAT if pt c/o blurred vision, one-sided weakness or slurred speech  1. What are your last 5 BP readings?  7/23: 90/60          106/70  2. Are you having any other symptoms (ex. Dizziness, headache, blurred vision, passed out)? Lightheadedness when standing up too quickly  3. What is your BP issue?   Stuart Harvey with [facility] states patietn has not taken all his meds and BP is low without BP medication--BP was 90/60. Stuart Harvey advised patient to eat. After eating, BP got up to 106/70, but still low. Please advise.

## 2023-06-06 NOTE — Telephone Encounter (Signed)
Contacted Stuart Harvey with home health, she states that patient has been having low blood pressures today. She states when she arrived she checked his vitals, his blood pressure was 90/60, he had not taken any medications yet for the day, she had him eat and drink some food about an hour ago, rechecked the blood pressure and it was 102/70, then she had him up walking around after about 3 minutes they rechecked blood pressure it was 90/60 again. He did not take his Metoprolol, however he did take his first dose of Entresto around lunch. I had her recheck blood pressure with hr - she rechecked it was 90/54 HR 64, o2 98. He denies symptoms (no lightheadedness, no dizziness, no shortness of breath) they state he can get a little lightheaded when standing. We did discuss moving positions slowly, continuing to stay hydrated, advised to hold blood pressure medication for now until I heard back from Dr.Arida on a further plan.   Will route to MD to see what he recommends.

## 2023-06-07 ENCOUNTER — Telehealth: Payer: Self-pay | Admitting: Cardiology

## 2023-06-07 ENCOUNTER — Other Ambulatory Visit: Payer: Self-pay | Admitting: Nurse Practitioner

## 2023-06-07 NOTE — Telephone Encounter (Signed)
Patient is having internal Defib implant on 8/6 by Dr. Lalla Brothers, his wife stopped by today to get surgical scrub for procedure and was inquiring about the lab orders for this procedure. They did stop by the lab before coming to our office, and was told there were no orders placed for this procedure. Please call and discuss.

## 2023-06-07 NOTE — Telephone Encounter (Signed)
I am not sure if this goes to April or EP nurses?

## 2023-06-08 NOTE — Telephone Encounter (Signed)
Call to pharmacy- they do have RF on file- they will fill forpatient. Requested Prescriptions  Pending Prescriptions Disp Refills   ezetimibe (ZETIA) 10 MG tablet [Pharmacy Med Name: EZETIMIBE 10 MG TABLET] 30 tablet 0    Sig: Take 1 tablet (10 mg total) by mouth daily.     Cardiovascular:  Antilipid - Sterol Transport Inhibitors Failed - 06/07/2023  1:22 PM      Failed - AST in normal range and within 360 days    AST  Date Value Ref Range Status  05/18/2023 43 (H) 15 - 41 U/L Final   SGOT(AST)  Date Value Ref Range Status  12/13/2014 30 15 - 37 Unit/L Final         Failed - Lipid Panel in normal range within the last 12 months    Cholesterol, Total  Date Value Ref Range Status  01/11/2023 146 100 - 199 mg/dL Final   Cholesterol Piccolo, Waived  Date Value Ref Range Status  12/18/2015 159 <200 mg/dL Final    Comment:                            Desirable                <200                         Borderline High      200- 239                         High                     >239    LDL Chol Calc (NIH)  Date Value Ref Range Status  01/11/2023 76 0 - 99 mg/dL Final   HDL  Date Value Ref Range Status  01/11/2023 52 >39 mg/dL Final   Triglycerides  Date Value Ref Range Status  05/19/2023 120 <150 mg/dL Final    Comment:    Performed at [facility], [address] [place], [place] [address]   Triglycerides Piccolo,Waived  Date Value Ref Range Status  12/18/2015 166 (H) <150 mg/dL Final    Comment:                            Normal                   <150                         Borderline High     150 - 199                         High                200 - 499                         Very High                >499          Passed - ALT in normal range and within 360 days    ALT  Date Value Ref Range Status  05/18/2023 20 0 - 44 U/L Final   SGPT (ALT)  Date  Value Ref Range Status  12/13/2014 20 14 - 63 U/L Final         Passed - Patient is not  pregnant      Passed - Valid encounter within last 12 months    Recent Outpatient Visits           4 months ago Benign hypertension with CKD (chronic kidney disease) stage III (HCC)   [place] [facility] [place], Corrie Dandy T, NP   8 months ago Breast pain, left   [place] [facility] Gabriel Cirri, NP   1 year ago Hyperlipidemia, unspecified hyperlipidemia type   [place] [facility] Vigg, Avanti, MD   1 year ago Screen for colon cancer   [place] [facility] Vigg, Avanti, MD   1 year ago Screening for colon cancer   [place] [facility] Vigg, Roma Schanz, MD       Future Appointments             In 4 days Cannady, Dorie Rank, NP [facility]   In 1 month [place], Dorie Rank, NP [facility], [facility]

## 2023-06-08 NOTE — Telephone Encounter (Signed)
I called pt and informed him that since he was recently in the hospital that he would not need additional labs for his procedure.   He was appreciative for the call.

## 2023-06-09 NOTE — Telephone Encounter (Signed)
Caller called to let Dr. Kirke Corin know patient has been doing well on medication change and BP today was 100/60.

## 2023-06-09 NOTE — Telephone Encounter (Signed)
Spoke with [facility] from [facility]. She stated pt is doing well today and current BP 100/60. She did report pt has been taking his Metoprolol 12.5 mg BID instead of once daily as prescribed.  Nurse recommended pt have pt decreased to once daily as ordered. Mary verbalized understanding.

## 2023-06-09 NOTE — Telephone Encounter (Signed)
Continue same medications for now and monitor blood pressure as long as he is not symptomatic.

## 2023-06-11 NOTE — Patient Instructions (Signed)
Be Involved in Caring For Your Health:  Taking Medications When medications are taken as directed, they can greatly improve your health. But if they are not taken as prescribed, they may not work. In some cases, not taking them correctly can be harmful. To help ensure your treatment remains effective and safe, understand your medications and how to take them. Bring your medications to each visit for review by your provider.  Your lab results, notes, and after visit summary will be available on My Chart. We strongly encourage you to use this feature. If lab results are abnormal the clinic will contact you with the appropriate steps. If the clinic does not contact you assume the results are satisfactory. You can always view your results on My Chart. If you have questions regarding your health or results, please contact the clinic during office hours. You can also ask questions on My Chart.  We at [facility] are grateful that you chose [place] to provide your care. We strive to provide evidence-based and compassionate care and are always looking for feedback. If you get a survey from the clinic please complete this so we can hear your opinions.  Heart-Healthy Eating Plan Many factors influence your heart health, including eating and exercise habits. Heart health is also called coronary health. Coronary risk increases with abnormal blood fat (lipid) levels. A heart-healthy eating plan includes limiting unhealthy fats, increasing healthy fats, limiting salt (sodium) intake, and making other diet and lifestyle changes. What is my plan? Your health care provider may recommend that: You limit your fat intake to _________% or less of your total calories each day. You limit your saturated fat intake to _________% or less of your total calories each day. You limit the amount of cholesterol in your diet to less than _________ mg per day. You limit the amount of sodium in your diet to less than _________  mg per day. What are tips for following this plan? Cooking Cook foods using methods other than frying. Baking, boiling, grilling, and broiling are all good options. Other ways to reduce fat include: Removing the skin from poultry. Removing all visible fats from meats. Steaming vegetables in water or broth. Meal planning  At meals, imagine dividing your plate into fourths: Fill one-half of your plate with vegetables and green salads. Fill one-fourth of your plate with whole grains. Fill one-fourth of your plate with lean protein foods. Eat 2-4 cups of vegetables per day. One cup of vegetables equals 1 cup (91 g) broccoli or cauliflower florets, 2 medium carrots, 1 large bell pepper, 1 large sweet potato, 1 large tomato, 1 medium white potato, 2 cups (150 g) raw leafy greens. Eat 1-2 cups of fruit per day. One cup of fruit equals 1 small apple, 1 large banana, 1 cup (237 g) mixed fruit, 1 large orange,  cup (82 g) dried fruit, 1 cup (240 mL) 100% fruit juice. Eat more foods that contain soluble fiber. Examples include apples, broccoli, carrots, beans, peas, and barley. Aim to get 25-30 g of fiber per day. Increase your consumption of legumes, nuts, and seeds to 4-5 servings per week. One serving of dried beans or legumes equals  cup (90 g) cooked, 1 serving of nuts is  oz (12 almonds, 24 pistachios, or 7 walnut halves), and 1 serving of seeds equals  oz (8 g). Fats Choose healthy fats more often. Choose monounsaturated and polyunsaturated fats, such as olive and canola oils, avocado oil, flaxseeds, walnuts, almonds, and seeds. Eat  more omega-3 fats. Choose salmon, mackerel, sardines, tuna, flaxseed oil, and ground flaxseeds. Aim to eat fish at least 2 times each week. Check food labels carefully to identify foods with trans fats or high amounts of saturated fat. Limit saturated fats. These are found in animal products, such as meats, butter, and cream. Plant sources of saturated fats  include palm oil, palm kernel oil, and coconut oil. Avoid foods with partially hydrogenated oils in them. These contain trans fats. Examples are stick margarine, some tub margarines, cookies, crackers, and other baked goods. Avoid fried foods. General information Eat more home-cooked food and less restaurant, buffet, and fast food. Limit or avoid alcohol. Limit foods that are high in added sugar and simple starches such as foods made using white refined flour (white breads, pastries, sweets). Lose weight if you are overweight. Losing just 5-10% of your body weight can help your overall health and prevent diseases such as diabetes and heart disease. Monitor your sodium intake, especially if you have high blood pressure. Talk with your health care provider about your sodium intake. Try to incorporate more vegetarian meals weekly. What foods should I eat? Fruits All fresh, canned (in natural juice), or frozen fruits. Vegetables Fresh or frozen vegetables (raw, steamed, roasted, or grilled). Green salads. Grains Most grains. Choose whole wheat and whole grains most of the time. Rice and pasta, including brown rice and pastas made with whole wheat. Meats and other proteins Lean, well-trimmed beef, veal, pork, and lamb. Chicken and [place] without skin. All fish and shellfish. Wild duck, rabbit, pheasant, and venison. Egg whites or low-cholesterol egg substitutes. Dried beans, peas, lentils, and tofu. Seeds and most nuts. Dairy Low-fat or nonfat cheeses, including ricotta and mozzarella. Skim or 1% milk (liquid, powdered, or evaporated). Buttermilk made with low-fat milk. Nonfat or low-fat yogurt. Fats and oils Non-hydrogenated (trans-free) margarines. Vegetable oils, including soybean, sesame, sunflower, olive, avocado, peanut, safflower, corn, canola, and cottonseed. Salad dressings or mayonnaise made with a vegetable oil. Beverages Water (mineral or sparkling). Coffee and tea. Unsweetened ice  tea. Diet beverages. Sweets and desserts Sherbet, gelatin, and fruit ice. Small amounts of dark chocolate. Limit all sweets and desserts. Seasonings and condiments All seasonings and condiments. The items listed above may not be a complete list of foods and beverages you can eat. Contact a dietitian for more options. What foods should I avoid? Fruits Canned fruit in heavy syrup. Fruit in cream or butter sauce. Fried fruit. Limit coconut. Vegetables Vegetables cooked in cheese, cream, or butter sauce. Fried vegetables. Grains Breads made with saturated or trans fats, oils, or whole milk. Croissants. Sweet rolls. Donuts. High-fat crackers, such as cheese crackers and chips. Meats and other proteins Fatty meats, such as hot dogs, ribs, sausage, bacon, rib-eye roast or steak. High-fat deli meats, such as salami and bologna. Caviar. Domestic duck and goose. Organ meats, such as liver. Dairy Cream, sour cream, cream cheese, and creamed cottage cheese. Whole-milk cheeses. Whole or 2% milk (liquid, evaporated, or condensed). Whole buttermilk. Cream sauce or high-fat cheese sauce. Whole-milk yogurt. Fats and oils Meat fat, or shortening. Cocoa butter, hydrogenated oils, palm oil, coconut oil, palm kernel oil. Solid fats and shortenings, including bacon fat, salt pork, lard, and butter. Nondairy cream substitutes. Salad dressings with cheese or sour cream. Beverages Regular sodas and any drinks with added sugar. Sweets and desserts Frosting. Pudding. Cookies. Cakes. Pies. Milk chocolate or white chocolate. Buttered syrups. Full-fat ice cream or ice cream drinks. The items listed above may  not be a complete list of foods and beverages to avoid. Contact a dietitian for more information. Summary Heart-healthy meal planning includes limiting unhealthy fats, increasing healthy fats, limiting salt (sodium) intake and making other diet and lifestyle changes. Lose weight if you are overweight. Losing just  5-10% of your body weight can help your overall health and prevent diseases such as diabetes and heart disease. Focus on eating a balance of foods, including fruits and vegetables, low-fat or nonfat dairy, lean protein, nuts and legumes, whole grains, and heart-healthy oils and fats. This information is not intended to replace advice given to you by your health care provider. Make sure you discuss any questions you have with your health care provider. Document Revised: 12/06/2021 Document Reviewed: 12/06/2021 Elsevier Patient Education  2024 [employer].

## 2023-06-12 ENCOUNTER — Encounter: Payer: Self-pay | Admitting: Nurse Practitioner

## 2023-06-12 ENCOUNTER — Inpatient Hospital Stay: Payer: Medicare HMO | Admitting: Nurse Practitioner

## 2023-06-12 VITALS — BP 105/70 | HR 65 | Temp 97.4°F | Ht 72.01 in | Wt 202.8 lb

## 2023-06-12 DIAGNOSIS — F324 Major depressive disorder, single episode, in partial remission: Secondary | ICD-10-CM

## 2023-06-12 DIAGNOSIS — I469 Cardiac arrest, cause unspecified: Secondary | ICD-10-CM | POA: Diagnosis not present

## 2023-06-12 DIAGNOSIS — N1831 Chronic kidney disease, stage 3a: Secondary | ICD-10-CM

## 2023-06-12 DIAGNOSIS — N183 Chronic kidney disease, stage 3 unspecified: Secondary | ICD-10-CM

## 2023-06-12 DIAGNOSIS — E782 Mixed hyperlipidemia: Secondary | ICD-10-CM

## 2023-06-12 DIAGNOSIS — J9601 Acute respiratory failure with hypoxia: Secondary | ICD-10-CM

## 2023-06-12 DIAGNOSIS — E039 Hypothyroidism, unspecified: Secondary | ICD-10-CM

## 2023-06-12 DIAGNOSIS — I5020 Unspecified systolic (congestive) heart failure: Secondary | ICD-10-CM

## 2023-06-12 DIAGNOSIS — I480 Paroxysmal atrial fibrillation: Secondary | ICD-10-CM

## 2023-06-12 DIAGNOSIS — I472 Ventricular tachycardia, unspecified: Secondary | ICD-10-CM

## 2023-06-12 DIAGNOSIS — I7143 Infrarenal abdominal aortic aneurysm, without rupture: Secondary | ICD-10-CM

## 2023-06-12 DIAGNOSIS — I129 Hypertensive chronic kidney disease with stage 1 through stage 4 chronic kidney disease, or unspecified chronic kidney disease: Secondary | ICD-10-CM | POA: Diagnosis not present

## 2023-06-12 MED ORDER — LIDOCAINE 5 % EX PTCH: 1.0000 | MEDICATED_PATCH | CUTANEOUS | 0 refills | Status: DC

## 2023-06-12 NOTE — Assessment & Plan Note (Signed)
Chronic, stable.  Continue current medication regimen and adjust as needed.  Lipid panel today. 

## 2023-06-12 NOTE — Assessment & Plan Note (Signed)
Stable at this time.  Is having some rib pain from fracture.  Will see if Lidocaine patches covered by insurance.  If not then recommend OTC patches by Icy/Hot.  Apply heat as needed, continue Tylenol PRN and may use Icy/Hot gel.  Also recommend he use his old heart pillow to support area and perform deep breathing during day to prevent PNA.

## 2023-06-12 NOTE — Assessment & Plan Note (Signed)
Chronic, ongoing.  Continue current medication regimen and adjust as needed.  TSH and Free T4 today. 

## 2023-06-12 NOTE — Assessment & Plan Note (Signed)
Followed by cardiology and vascular.  History of stent in 2014.  Continue to collaborate with team members. 

## 2023-06-12 NOTE — Assessment & Plan Note (Signed)
Chronic, ongoing.  Euvolemic on exam today. Recent EF stable at 40-45% July 2024.  Continue collaboration with CCM on Entresto and collaboration with cardiology + current medication regimen.  Recommend: - Reminded to call for an overnight weight gain of >2 pounds or a weekly weight gain of >5 pounds - not adding salt to food and read food labels. Reviewed the importance of keeping daily sodium intake to 2000mg  daily. - Avoid NSAIDS

## 2023-06-12 NOTE — Assessment & Plan Note (Signed)
Acute an stable at this time.  Monitor closely and continue to collaborate with cardiology.

## 2023-06-12 NOTE — Assessment & Plan Note (Signed)
Chronic, ongoing. Followed by nephrology.  Continue current medication regimen and adjust as needed.  Labs: CMP.

## 2023-06-12 NOTE — Progress Notes (Signed)
BP 105/70   Pulse 65   Temp (!) 97.4 F (36.3 C) (Oral)   Ht 6' 0.01" (1.829 m)   Wt 202 lb 12.8 oz (92 kg)   SpO2 98%   BMI 27.50 kg/m    Subjective:    Patient ID: Stuart Harvey, male    DOB: 09/04/1951, 72 y.o.   MRN: [id number]  HPI: Stuart Harvey is a 72 y.o. male  Chief Complaint  Patient presents with   Hospitalization Follow-up    Cardiac Arrest. Patient states he is feeling well.    Transition of Care Hospital Follow up.  Presents today for hospital follow-up.  Was admitted on 05/18/23 after cardiac arrest, wife performed CPR at time, he was then transferred to [facility] in [place].  Is scheduled for ICD on 06/20/23 with Dr. Lalla Brothers.  Having some discomfort to rib fracture area, takes Tylenol for this.  Currently wearing external defibrillator in place.  "Stuart Harvey is a 72 y.o. male with a history of CAD s/p CABG x4 in 2008 at [facility] with occluded distal SVG-OM3 but patent LIMA-LAD, SVG-D2, and SVG-RPDA on most recent cardiac catheterization in 07/2022, ischemic cardiomyopathy/ chronic HFrEF with EF of 35-40% on Echo in 07/2022, paroxysmal atrial fibrillation on Eliquis, infrarenal AAA s/p open repair in 08/2007, right iliac artery aneurysm s/p EVAR in 07/2021, hypertension, hyperlipidemia, hypothyroidism, CKD stage III, GERD, anemia, and depression who transferred from [facility] to [facility] on 05/25/2023 for and EP Study after being admitted for a cardiac arrest on 05/18/2023.    Patient was admitted in 05/2018 after he was found down on his back porch with possible seizure like activity. Per EMR, EMS found patient to be in VT at that time and required defibrillation. Echo showed LVEF of 50-55%. Diagnostic cath showed borderline disease in the SVG to [facility] but otherwise grafts were patent. Ultimately presentation was felt to be secondary to recent stroke. In 06/2022, patient reported worsening dyspnea and left sided chest pain. Myoview was ordered and showed LVEF of 38% with evidence of  prior MI. Subsequent Echo confirmed depressed EF of 35-40% with global hypokinesis. This led to a right/ left cardiac catheterization in 07/2022 which showed severe multivessel native CAD with occlusion of SVG to OM3 but patent LIMA to LAD, SVG to D2, and SVG to RPDA. Continued medical therapy was recommended at that time.    Patient had been in his usual state of health until 05/18/2023. He was out in his garage when his wife went out to ask him to get ready to go to their son's cookout when she noticed him sitting on the floor holding his head. He said he was feeling very dizzy. He attempted to stand up but then fell forward and apparently lost consciousness. His wife said she thought he was turning blue. She did not assess for a pulse but did begin CPR after calling EMS. She continued CPR uninterrupted until EMS arrived in about 5-6 minutes. At that point, EMS continued CPR. Patient was reportedly pulseless on EMS arrival and was "blue from the chest up." ROSC was achieved after an additional 4 minutes of CPR. No medications were administered. He was placed on a non-rebreather mask and taken to [facility] where he required intubation on arrival.    Initial EKG showed atrial fibrillation with rate of 180 bpm and he was hypotensive. High-sensitivity troponin mildly elevated at 21 >> 40 >> 1010 >>116 consistent with demand ischemic. BNP 513.6. Initial labs also remarkable for  elevated WBC of 18.2, hyperkalemia of 5.7, and AKI with creatinine of 2.01. Imagining including head, chest , abdominal, and pelvic CTs did not show any acute findings other than acute rib fractures from CPR. He was admitted to ICU and Cardiology was consulted for further evaluation.  He was started on IV Amiodarone. Home CHF medications were held and he was started on IV Heparin in lieu of Eliquis. He was able to be extubated on 05/19/2023. Echo showed LVEF of 40-445% with inferolateral wall hypokinesis and grade 1 diastolic dysfunction. He was  briefly diuresed with IV lasix and also treated for possible aspiration pneumonia. He underwent a successful DCCV with return of sinus rhythm on 05/22/2023. EP was consulted and it was felt that VT could not be excluded as the cause of his cardiac arrest. Cardiac MRI was performed on 05/24/2023 which showed a moderately reduced LVEF of 38% with lateral wall LV basal-mid subendocardial scar/ LGE. LGE/ scar accounted for 12.77g (11%) of total myocardial mass. Findings were consistent with prior LV infarct. He was then transferred to [facility] for EP study.    Hospital Course     Cardiac Arrest VT Patient was initially admitted at [facility] on 05/18/2023 after a witnessed cardiac arrest. Upon EMS arrival, he did not have a pulse. ROSC was achieved after about a total of 10 minutes of CPR in the field. Initial EKG showed rapid atrial fibrillation with rate in the 180s. However, there was concern that VT may of been the cause of this arrest given similar episode in 2019 where VT was noted in the field. Cardiac MRI at [facility] showed LVEF of 38% with lateral wall LV basal-mid subendocardial scar/ LGE. LGE/ scar accounted for 12.77g (11%) of total myocardial mass. Findings were consistent with prior LV infarct. He was transferred to [facility] and underwent EP Study on 05/26/2023 which showed inducible sustained monomorphic VT. Given inability to safely pause anticoagulation given recent cardioversion, LifeVest was placed with plans to bring him back for ICD after 4 weeks of uninterrupted anticoagulation. He was discharged on a PO Amiodarone taper (400mg  twice daily x5 >> 200mg  twice daily x5 days >>  200mg  once daily). He was instructed not to drive until given permission to do so by EP.     Atrial Fibrillation with RVR Patient has a history of atrial fibrillation. Initial EKG after ROSC showed rapid atrial fibrillation with rates in the 180s. He was briefly placed on IV Amiodarone and underwent successful DCCV on 05/22/2023 at  [facility] with restoration of sinus rhythm. He will be discharge on PO Amiodaron as above as well as home Toprol-XL 12.5mg  daily. Continue Eliquis 5mg  twice daily.    CAD s/p CABG History of remote CABG x4 in 2008. Last cath in 07/2022 showed occluded distal SVG to OM3 but remainder of grafts patent. High-sensitivity troponin mildly elevated at [facility] consistent with demand ischemia. Cardiac MRI showed findings consistent with prior LV infarct as stated above with lateral wall LV basal-mid subendocardial scar/ LGE. No aspirin given need for full anticoagulation. Continue Crestor and Zetia.   Ischemic Cardiomyopathy Chronic HFrEF BNP elevated at 513 at [facility]. Echo showed LVEF of 40-45% with inferolateral wall hypokinesis, moderate asymmetric LVH of the septal segment, and grade 1 diastolic dysfunction. He did receive some IV diuresis. Home Entresto and Toprol-XL were initially held due to renal function and soft BP. However, BP has improved and renal function as returned to baseline. He will be discharged on home Entresto 24-26mg  twice  daily and Toprol-XL 12.5mg  daily. He has not been on Spironolactone or SGLT2 inhibitor in the past due to renal function. Recommend repeat BMET at follow-up visit.    Hypertension Patient initially hypotensive on arrival to [facility] after cardiac arrest. BP has since improved so will restart home Entresto and Toprol-XL. Advised patient to monitor BP closely at home with this.    Hyperlipidemia Continue Crestor 40mg  daily and Zetia 10mg  daily.    AKI on CKD Stage III Creatinine peaked at 2.01 on admission in setting of cardiac arrest. Baseline around 1.4. Improved back to baseline and was 1.24 at discharge. Recommend repeat BMET at follow-up visit.   Hypothyroidism Continue home Synthroid.    Of note, please see Discharge Summary from 05/24/2023 for any additional information regarding hospitalization at [facility] before transfer to [facility]."  Hospital/Facility: [facility]  Physician: Marjie Skiff PA-C D/C Date: 05/27/23  Records Requested: 06/12/23 Records Received: 06/12/23 Records Reviewed: 06/12/23  Diagnoses on Discharge: Cardiac Arrest  Date of interactive Contact within 48 hours of discharge:  Contact was through: phone  Date of 7 day or 14 day face-to-face visit:    within 14 days  Outpatient Encounter Medications as of 06/12/2023  Medication Sig   amiodarone (PACERONE) 200 MG tablet Take 2 tablets (400mg ) twice daily for 5 days, then 1 tablet (200mg ) twice daily for 5 days, and then 1 table (200mg ) once daily.   apixaban (ELIQUIS) 5 MG TABS tablet Take 1 tablet (5 mg total) by mouth 2 (two) times daily.   buPROPion (WELLBUTRIN SR) 200 MG 12 hr tablet Take 1 tablet (200 mg total) by mouth 2 (two) times daily.   Cholecalciferol 50 MCG (2000 UT) CAPS Take 1 capsule by mouth daily.    citalopram (CELEXA) 20 MG tablet Take 1 tablet (20 mg total) by mouth daily.   ezetimibe (ZETIA) 10 MG tablet Take 1 tablet (10 mg total) by mouth daily.   levothyroxine (SYNTHROID) 75 MCG tablet Take 1 tablet (75 mcg total) by mouth daily.   lidocaine (LIDODERM) 5 % Place 1 patch onto the skin daily. Remove & Discard patch within 12 hours or as directed by MD   metoprolol succinate (TOPROL XL) 25 MG 24 hr tablet Take 0.5 tablets (12.5 mg total) by mouth daily.   nitroGLYCERIN (NITROSTAT) 0.4 MG SL tablet Place 1 tablet (0.4 mg total) under the tongue every 5 (five) minutes x 3 doses as needed for chest pain.   omeprazole (PRILOSEC) 20 MG capsule Take 1 capsule (20 mg total) by mouth daily.   rosuvastatin (CRESTOR) 40 MG tablet Take 1 tablet (40 mg total) by mouth daily.   sacubitril-valsartan (ENTRESTO) 24-26 MG Take 1 tablet by mouth 2 (two) times daily.   No facility-administered encounter medications on file as of 06/12/2023.   Diagnostic Tests Reviewed/Disposition:     Latest Ref Rng & Units 05/27/2023    2:41 AM 05/26/2023    3:16 AM 05/22/2023    4:55 AM  CBC   WBC 4.0 - 10.5 K/uL 11.2  10.6  10.4   Hemoglobin 13.0 - 17.0 g/dL 91.4  78.2  95.6   Hematocrit 39.0 - 52.0 % 37.0  36.0  36.6   Platelets 150 - 400 K/uL 213  182  126        Latest Ref Rng & Units 05/27/2023    2:41 AM 05/26/2023    3:16 AM 05/25/2023    5:04 AM  CMP  Glucose 70 - 99 mg/dL 213  111  111   BUN 8 - 23 mg/dL 18  18  18    Creatinine 0.61 - 1.24 mg/dL 0.10  2.72  5.36   Sodium 135 - 145 mmol/L 137  137  139   Potassium 3.5 - 5.1 mmol/L 3.9  3.9  4.1   Chloride 98 - 111 mmol/L 105  104  105   CO2 22 - 32 mmol/L 23  26  26    Calcium 8.9 - 10.3 mg/dL 8.9  8.8  8.8     Consults: Cardiology  Discharge Instructions: Follow-up with cardiology and PCP  Disease/illness Education: Reviewed with patient  Home Health/Community Services Discussions/Referrals: Currently has PT and OT in home  Establishment or re-establishment of referral orders for community resources: none  Discussion with other health care providers: Reviewed all recent notes.  Assessment and Support of treatment regimen adherence: Reviewed with patient  Appointments Coordinated with: Reviewed with patient  Education for self-management, independent living, and ADLs:  Reviewed with patient  HYPERTENSION / HYPERLIPIDEMIA/CKD 3 Saw cardiology 02/17/23.  Last echo on July 2024 -- with EF 40-45% with LV moderately decreased function -- change and decline in function.  Was doing pulmonary/heart track, but insurance is not letting him return.  Has not used NTG since leaving hospital.  Last saw vascular 12/07/21 for his AAA, had stent in 2014.  Returns to see them on 07/18/23. Meds: Entresto 24-26 1 tablet BID, Metoprolol XL 12.5 MG daily, Amiodarone (started in hospital), Rosuvastatin, Zetia Satisfied with current treatment? yes Duration of hypertension: chronic BP monitoring frequency: twice a day BP range: 110-115/75 to 80 BP medication side effects: no Duration of hyperlipidemia: chronic Cholesterol  medication side effects: no Cholesterol supplements: none Medication compliance: good compliance Aspirin: no Recent stressors: no Recurrent headaches: no Visual changes: no Palpitations: no Dyspnea: occasional with activity Chest pain: no Lower extremity edema: no Dizzy/lightheaded: if gets up to fast  The 10-year ASCVD risk score (Arnett DK, et al., 2019) is: 13.8%   Values used to calculate the score:     Age: 80 years     Sex: Male     Is Non-Hispanic African American: No     Diabetic: No     Tobacco smoker: No     Systolic Blood Pressure: 105 mmHg     Is BP treated: Yes     HDL Cholesterol: 52 mg/dL     Total Cholesterol: 146 mg/dL  HYPOTHYROIDISM Continues on Levothyroxine 75 MCG. Thyroid control status:stable Satisfied with current treatment? yes Medication side effects: no Medication compliance: good compliance Etiology of hypothyroidism:  Recent dose adjustment:no Fatigue: feeling better now Cold intolerance: no Heat intolerance: no Weight gain: no Weight loss: no Constipation: improved recently Diarrhea/loose stools: no Palpitations: no Lower extremity edema: no Anxiety/depressed mood: no    CHRONIC KIDNEY DISEASE Saw nephrology last 04/18/23.  CKD status: stable Medications renally dose: yes Previous renal evaluation: no Pneumovax:  Up to Date Influenza Vaccine:  Up to Date     Latest Ref Rng & Units 05/27/2023    2:41 AM 05/26/2023    3:16 AM 05/25/2023    5:04 AM  BMP  Glucose 70 - 99 mg/dL 644  034  742   BUN 8 - 23 mg/dL 18  18  18    Creatinine 0.61 - 1.24 mg/dL 5.95  6.38  7.56   Sodium 135 - 145 mmol/L 137  137  139   Potassium 3.5 - 5.1 mmol/L 3.9  3.9  4.1  Chloride 98 - 111 mmol/L 105  104  105   CO2 22 - 32 mmol/L 23  26  26    Calcium 8.9 - 10.3 mg/dL 8.9  8.8  8.8        1/61/0960    1:13 PM 02/28/2023   10:34 AM 01/11/2023   10:36 AM 10/11/2022   10:46 AM 09/22/2022    1:38 PM  Depression screen PHQ 2/9  Decreased Interest 0 1 0  1 0  Down, Depressed, Hopeless 1 0 0 0 0  PHQ - 2 Score 1 1 0 1 0  Altered sleeping 1 1 1 1 1   Tired, decreased energy 0 1 1 1 1   Change in appetite 0 0 0 0 0  Feeling bad or failure about yourself  0 0 0 0 0  Trouble concentrating 0 1 0 1 1  Moving slowly or fidgety/restless 0 0 0 0 0  Suicidal thoughts 0 0 0 0 0  PHQ-9 Score 2 4 2 4 3   Difficult doing work/chores  Not difficult at all Not difficult at all Not difficult at all Somewhat difficult       06/12/2023    1:14 PM 01/11/2023   10:36 AM 10/11/2022   10:46 AM 09/29/2021    9:10 AM  GAD 7 : Generalized Anxiety Score  Nervous, Anxious, on Edge 0 0 0 1  Control/stop worrying 0 0 0 0  Worry too much - different things 1 0 0 1  Trouble relaxing 0 0 0 0  Restless 0 0 0 0  Easily annoyed or irritable 0 0 1 1  Afraid - awful might happen 0 0 1 1  Total GAD 7 Score 1 0 2 4  Anxiety Difficulty  Not difficult at all Not difficult at all Not difficult at all   Relevant past medical, surgical, family and social history reviewed and updated as indicated. Interim medical history since our last visit reviewed. Allergies and medications reviewed and updated.  Review of Systems  Constitutional:  Negative for activity change, diaphoresis, fatigue and fever.  Respiratory:  Negative for cough, chest tightness, shortness of breath and wheezing.   Cardiovascular:  Negative for chest pain, palpitations and leg swelling.  Gastrointestinal: Negative.   Endocrine: Negative for cold intolerance, heat intolerance, polydipsia, polyphagia and polyuria.  Musculoskeletal:  Positive for arthralgias.  Neurological: Negative.   Psychiatric/Behavioral: Negative.      Per HPI unless specifically indicated above     Objective:    BP 105/70   Pulse 65   Temp (!) 97.4 F (36.3 C) (Oral)   Ht 6' 0.01" (1.829 m)   Wt 202 lb 12.8 oz (92 kg)   SpO2 98%   BMI 27.50 kg/m   Wt Readings from Last 3 Encounters:  06/12/23 202 lb 12.8 oz (92 kg)   05/27/23 204 lb 6.4 oz (92.7 kg)  05/25/23 211 lb 8 oz (95.9 kg)    Physical Exam Vitals and nursing note reviewed.  Constitutional:      General: He is awake. He is not in acute distress.    Appearance: He is well-developed. He is not ill-appearing.  HENT:     Head: Normocephalic and atraumatic.     Right Ear: Hearing normal. No drainage.     Left Ear: Hearing normal. No drainage.     Mouth/Throat:     Pharynx: Uvula midline.  Eyes:     General: Lids are normal.        Right  eye: No discharge.        Left eye: No discharge.     Conjunctiva/sclera: Conjunctivae normal.     Pupils: Pupils are equal, round, and reactive to light.  Neck:     Thyroid: No thyromegaly.     Vascular: No carotid bruit or JVD.     Trachea: Trachea normal.  Cardiovascular:     Rate and Rhythm: Normal rate and regular rhythm.     Heart sounds: Normal heart sounds, S1 normal and S2 normal. No murmur heard.    No gallop.  Pulmonary:     Effort: Pulmonary effort is normal.     Breath sounds: Normal breath sounds.  Abdominal:     General: Bowel sounds are normal.     Palpations: Abdomen is soft. There is no hepatomegaly or splenomegaly.     Tenderness: There is no abdominal tenderness.  Musculoskeletal:        General: Normal range of motion.     Cervical back: Normal range of motion and neck supple.     Right lower leg: No edema.     Left lower leg: No edema.  Skin:    General: Skin is warm and dry.     Capillary Refill: Capillary refill takes less than 2 seconds.     Findings: No rash.     Comments: Scattered areas of pale purple bruises to bilateral upper extremities.  Intact skin.  Neurological:     Mental Status: He is alert and oriented to person, place, and time.     Deep Tendon Reflexes: Reflexes are normal and symmetric.  Psychiatric:        Mood and Affect: Mood normal.        Behavior: Behavior normal. Behavior is cooperative.        Thought Content: Thought content normal.         Judgment: Judgment normal.     Results for orders placed or performed during the hospital encounter of 05/25/23  Surgical PCR screen   Specimen: Nasal Mucosa; Nasal Swab  Result Value Ref Range   MRSA, PCR NEGATIVE NEGATIVE   Staphylococcus aureus POSITIVE (A) NEGATIVE  Basic metabolic panel  Result Value Ref Range   Sodium 137 135 - 145 mmol/L   Potassium 3.9 3.5 - 5.1 mmol/L   Chloride 104 98 - 111 mmol/L   CO2 26 22 - 32 mmol/L   Glucose, Bld 111 (H) 70 - 99 mg/dL   BUN 18 8 - 23 mg/dL   Creatinine, Ser 9.52 (H) 0.61 - 1.24 mg/dL   Calcium 8.8 (L) 8.9 - 10.3 mg/dL   GFR, Estimated >84 >13 mL/min   Anion gap 7 5 - 15  CBC  Result Value Ref Range   WBC 10.6 (H) 4.0 - 10.5 K/uL   RBC 3.90 (L) 4.22 - 5.81 MIL/uL   Hemoglobin 11.8 (L) 13.0 - 17.0 g/dL   HCT 24.4 (L) 01.0 - 27.2 %   MCV 92.3 80.0 - 100.0 fL   MCH 30.3 26.0 - 34.0 pg   MCHC 32.8 30.0 - 36.0 g/dL   RDW 53.6 64.4 - 03.4 %   Platelets 182 150 - 400 K/uL   nRBC 0.0 0.0 - 0.2 %  Glucose, capillary  Result Value Ref Range   Glucose-Capillary 109 (H) 70 - 99 mg/dL  Glucose, capillary  Result Value Ref Range   Glucose-Capillary 124 (H) 70 - 99 mg/dL  Glucose, capillary  Result Value Ref Range   Glucose-Capillary 109 (  H) 70 - 99 mg/dL  Glucose, capillary  Result Value Ref Range   Glucose-Capillary 93 70 - 99 mg/dL  Basic metabolic panel  Result Value Ref Range   Sodium 137 135 - 145 mmol/L   Potassium 3.9 3.5 - 5.1 mmol/L   Chloride 105 98 - 111 mmol/L   CO2 23 22 - 32 mmol/L   Glucose, Bld 101 (H) 70 - 99 mg/dL   BUN 18 8 - 23 mg/dL   Creatinine, Ser 2.95 0.61 - 1.24 mg/dL   Calcium 8.9 8.9 - 28.4 mg/dL   GFR, Estimated >13 >24 mL/min   Anion gap 9 5 - 15  CBC  Result Value Ref Range   WBC 11.2 (H) 4.0 - 10.5 K/uL   RBC 3.99 (L) 4.22 - 5.81 MIL/uL   Hemoglobin 12.1 (L) 13.0 - 17.0 g/dL   HCT 40.1 (L) 02.7 - 25.3 %   MCV 92.7 80.0 - 100.0 fL   MCH 30.3 26.0 - 34.0 pg   MCHC 32.7 30.0 - 36.0 g/dL    RDW 66.4 40.3 - 47.4 %   Platelets 213 150 - 400 K/uL   nRBC 0.0 0.0 - 0.2 %      Assessment & Plan:   Problem List Items Addressed This Visit       Cardiovascular and Mediastinum   AAA (abdominal aortic aneurysm) (HCC)    Followed by cardiology and vascular.  History of stent in 2014.  Continue to collaborate with team members.      Benign hypertension with CKD (chronic kidney disease) stage III (HCC)    Chronic, stable.  BP well below goal for age today.  Monitor closely as is on Toprol XL and Wellbutrin.  Recommend he monitor BP at least a few mornings a week at home and document.  DASH diet at home.  Continue current medication regimen and adjust as needed.  Labs today: CBC, CMP.       Relevant Orders   Comprehensive metabolic panel   Lipid Panel w/o Chol/HDL Ratio   Cardiac arrest (HCC) - Primary    Stable at this time.  Is having some rib pain from fracture.  Will see if Lidocaine patches covered by insurance.  If not then recommend OTC patches by Icy/Hot.  Apply heat as needed, continue Tylenol PRN and may use Icy/Hot gel.  Also recommend he use his old heart pillow to support area and perform deep breathing during day to prevent PNA.      Heart failure with reduced ejection fraction (HCC)    Chronic, ongoing.  Euvolemic on exam today. Recent EF stable at 40-45% July 2024.  Continue collaboration with CCM on Entresto and collaboration with cardiology + current medication regimen.  Recommend: - Reminded to call for an overnight weight gain of >2 pounds or a weekly weight gain of >5 pounds - not adding salt to food and read food labels. Reviewed the importance of keeping daily sodium intake to 2000mg  daily. - Avoid NSAIDS      Relevant Orders   Comprehensive metabolic panel   Paroxysmal atrial fibrillation (HCC)    Chronic, ongoing.  Rate controlled at this time, is on Amiodarone currently.  Continue  collaboration with cardiology, recent notes reviewed. Check CMP and  thyroid labs today.      Relevant Orders   Comprehensive metabolic panel   VT (ventricular tachycardia) (HCC)    Stable, has external defib in place.  Continue collaboration with cardiology.  Recent notes reviewed.  Relevant Orders   Comprehensive metabolic panel     Respiratory   Acute respiratory failure (HCC)    Acute an stable at this time.  Monitor closely and continue to collaborate with cardiology.        Endocrine   Hypothyroid    Chronic, ongoing.  Continue current medication regimen and adjust as needed.  TSH and Free T4 today.      Relevant Orders   T4, free   TSH     Genitourinary   CKD (chronic kidney disease) stage 3, GFR 30-59 ml/min (HCC)    Chronic, ongoing. Followed by nephrology.  Continue current medication regimen and adjust as needed.  Labs: CMP.      Relevant Orders   CBC with Differential/Platelet   Comprehensive metabolic panel     Other   Hyperlipidemia    Chronic, stable.  Continue current medication regimen and adjust as needed.  Lipid panel today.        Relevant Orders   Comprehensive metabolic panel   Lipid Panel w/o Chol/HDL Ratio     Follow up plan: Return for as scheduled August 28th, ALSO needs Medicare Wellness scheduled with nurse after 08/10/23.

## 2023-06-12 NOTE — Assessment & Plan Note (Signed)
Stable, has external defib in place.  Continue collaboration with cardiology.  Recent notes reviewed.

## 2023-06-12 NOTE — Assessment & Plan Note (Signed)
Chronic, stable.  BP well below goal for age today.  Monitor closely as is on Toprol XL and Wellbutrin.  Recommend he monitor BP at least a few mornings a week at home and document.  DASH diet at home.  Continue current medication regimen and adjust as needed.  Labs today: CBC, CMP.

## 2023-06-12 NOTE — Assessment & Plan Note (Signed)
Chronic, ongoing.  Rate controlled at this time, is on Amiodarone currently.  Continue  collaboration with cardiology, recent notes reviewed. Check CMP and thyroid labs today.

## 2023-06-13 ENCOUNTER — Other Ambulatory Visit: Payer: Self-pay | Admitting: Nurse Practitioner

## 2023-06-13 DIAGNOSIS — E039 Hypothyroidism, unspecified: Secondary | ICD-10-CM

## 2023-06-13 MED ORDER — LEVOTHYROXINE SODIUM 100 MCG PO TABS: 100.0000 ug | ORAL_TABLET | Freq: Every day | ORAL | 2 refills | Status: DC

## 2023-06-13 NOTE — Progress Notes (Signed)
Called and scheduled appointment on 07/27/2023 @ 11:00 am.

## 2023-06-13 NOTE — Progress Notes (Signed)
Contacted via MyChart -- needs lab only visit in 6 weeks please Good morning Stuart Harvey, your labs have returned: - Kidney function shows some mild decline in eGFR and creatinine.  This may be related to medication changes recently and hospitalization.  Ensure you are taking in adequate fluid daily and we will monitor this closely.  Electrolytes overall are stable. - Cholesterol levels overall stable with exception of triglycerides which are a little elevated, continue Rosuvastatin and Zetia. - CBC is overall stable with no anemia at this time. - Thyroid labs are showing some elevation in TSH, but Free T4 upper normal.  I would recommend we increase your Levothyroxine to 100 MCG, stop 75 MCG dosing, and recheck labs in 6 weeks outpatient.  My staff will call.  Sometimes with Amiodarone we need to adjust thyroid medication.  Any questions? Keep being amazing!!  Thank you for allowing me to participate in your care.  I appreciate you. Kindest regards, Kemani Demarais

## 2023-06-19 NOTE — Pre-Procedure Instructions (Signed)
Instructed patient on the following items: Arrival time 2:00 Nothing to eat or drink after midnight No meds AM of procedure Responsible person to drive you home and stay with you for 24 hrs Wash with special soap night before and morning of procedure If on anti-coagulant drug instructions Eliquis- last dose Saturday 8/3.

## 2023-06-20 ENCOUNTER — Telehealth: Payer: Self-pay | Admitting: Cardiology

## 2023-06-20 NOTE — Telephone Encounter (Signed)
Pt c/o medication issue:  1. Name of Medication: apixaban (ELIQUIS) 5 MG TABS tablet   2. How are you currently taking this medication (dosage and times per day)?   3. Are you having a reaction (difficulty breathing--STAT)?   4. What is your medication issue? Patient's wife is requesting call back to discuss if this patient is to start this medication now that he is no longer doing procedure today due to provider being out. Please advise.

## 2023-06-20 NOTE — Telephone Encounter (Signed)
Patient was scheduled for an ICD implant today and has been holding his Eliquis. Patient's procedure was cancelled. I spoke with the wife and advised her that the patient should restart Eliquis as prescribed. We will call back with a new procedure date and he will need to hold it again for two days prior to his procedure.

## 2023-06-20 NOTE — Pre-Procedure Instructions (Signed)
Spoke with patient's wife Sedalia Muta, notified them Dr Lalla Brothers is sick and will not be able to do procedure today.  The office will call to reschedule.

## 2023-06-21 ENCOUNTER — Telehealth: Payer: Self-pay

## 2023-06-21 NOTE — Telephone Encounter (Signed)
Pt called in asking when he would be able to drive after his ICD Implant. After speaking with Angelica Chessman (device RN) I advised pt that he could not drive until he comes in for his wound check and they would let him know at that time when it is safe for him to drive.

## 2023-06-22 ENCOUNTER — Telehealth: Payer: Self-pay | Admitting: Cardiovascular Disease

## 2023-06-22 NOTE — Telephone Encounter (Signed)
Calling to make provider aware that pt was discharged today. Nurse states that pt is eating less, weight is dow 5 lbs from last assessment that was done on 8/5. Pt's weight is now 197 lbs. Please advise

## 2023-06-22 NOTE — Telephone Encounter (Signed)
Spoke with the patient who states that he has lost a little bit of weight since being out of the hospital. He reports that he was at 207lbs and now fluctuates between 197-200lbs. He reports that he just doesn't have as much as an appetite as he used to. Overall he is feeling good though. Denies any weakness, fatigue, lightheadedness or dizziness.

## 2023-06-22 NOTE — Telephone Encounter (Signed)
Left message for nurse to call back.  

## 2023-06-22 NOTE — Telephone Encounter (Signed)
  Stuart Harvey is returning call, she said, to leave a detailed message to her phone or for any recommendations to call the patient directly

## 2023-06-26 ENCOUNTER — Encounter: Payer: Self-pay | Admitting: Cardiology

## 2023-06-26 ENCOUNTER — Telehealth: Payer: Self-pay | Admitting: Cardiovascular Disease

## 2023-06-26 ENCOUNTER — Encounter (INDEPENDENT_AMBULATORY_CARE_PROVIDER_SITE_OTHER): Payer: Self-pay | Admitting: Vascular Surgery

## 2023-06-26 NOTE — Telephone Encounter (Signed)
Patient's wife is requesting call back to discuss upcoming procedure. Please advise.

## 2023-06-26 NOTE — Telephone Encounter (Signed)
Patients wife called and stated she received a letter stating they were approved for the ICD and a separate letter stating they were not approved for a pacemaker. Patient wife wanted clarification on whether they can keep appointment, advised them that if they received a letter from insurance stating ICD is approved they should be fine but they can call their insurance to verify.

## 2023-07-02 NOTE — Pre-Procedure Instructions (Signed)
Instructed patient on the following items: Arrival time 2:30 Nothing to eat or drink after midnight No meds AM of procedure Responsible person to drive you home and stay with you for 24 hrs Wash with special soap night before and morning of procedure If on anti-coagulant drug instructions Eliquis- last dose 8/16.

## 2023-07-03 ENCOUNTER — Ambulatory Visit (HOSPITAL_COMMUNITY)
Admission: RE | Admit: 2023-07-03 | Discharge: 2023-07-04 | Disposition: A | Discharge status: Home / Self Care | Payer: Medicare HMO | Source: Home / Self Care | Attending: Cardiology | Admitting: Cardiology

## 2023-07-03 ENCOUNTER — Ambulatory Visit (HOSPITAL_COMMUNITY)
Admission: RE | Disposition: A | Discharge status: Home / Self Care | Payer: Self-pay | Source: Home / Self Care | Attending: Cardiology

## 2023-07-03 ENCOUNTER — Other Ambulatory Visit: Payer: Self-pay

## 2023-07-03 DIAGNOSIS — I472 Ventricular tachycardia, unspecified: Secondary | ICD-10-CM | POA: Insufficient documentation

## 2023-07-03 DIAGNOSIS — Z95810 Presence of automatic (implantable) cardiac defibrillator: Principal | ICD-10-CM | POA: Diagnosis present

## 2023-07-03 DIAGNOSIS — I5022 Chronic systolic (congestive) heart failure: Secondary | ICD-10-CM | POA: Diagnosis present

## 2023-07-03 HISTORY — PX: ICD IMPLANT: EP1208

## 2023-07-03 SURGERY — ICD IMPLANT

## 2023-07-03 MED ORDER — FENTANYL CITRATE (PF) 100 MCG/2ML IJ SOLN
INTRAMUSCULAR | Status: AC
  Filled 2023-07-03: qty 2

## 2023-07-03 MED ORDER — BUPROPION HCL ER (SR) 100 MG PO TB12
200.0000 mg | ORAL_TABLET | Freq: Two times a day (BID) | ORAL | Status: DC
  Administered 2023-07-03 – 2023-07-04 (×2): 200 mg via ORAL
  Filled 2023-07-03 (×3): qty 2

## 2023-07-03 MED ORDER — AMIODARONE HCL 200 MG PO TABS
200.0000 mg | ORAL_TABLET | Freq: Every day | ORAL | Status: DC
  Administered 2023-07-04: 200 mg via ORAL
  Filled 2023-07-03: qty 1

## 2023-07-03 MED ORDER — SACUBITRIL-VALSARTAN 24-26 MG PO TABS
1.0000 | ORAL_TABLET | Freq: Two times a day (BID) | ORAL | Status: DC
  Administered 2023-07-03 – 2023-07-04 (×2): 1 via ORAL
  Filled 2023-07-03 (×2): qty 1

## 2023-07-03 MED ORDER — LEVOTHYROXINE SODIUM 75 MCG PO TABS
75.0000 ug | ORAL_TABLET | Freq: Every day | ORAL | Status: DC
  Administered 2023-07-04: 75 ug via ORAL
  Filled 2023-07-03: qty 1

## 2023-07-03 MED ORDER — SODIUM CHLORIDE 0.9 % IV SOLN
80.0000 mg | INTRAVENOUS | Status: AC
  Administered 2023-07-03: 80 mg

## 2023-07-03 MED ORDER — SODIUM CHLORIDE 0.9 % IV SOLN: INTRAVENOUS | Status: DC

## 2023-07-03 MED ORDER — LIDOCAINE HCL (PF) 1 % IJ SOLN
INTRAMUSCULAR | Status: DC | PRN
  Administered 2023-07-03: 60 mL

## 2023-07-03 MED ORDER — FENTANYL CITRATE (PF) 100 MCG/2ML IJ SOLN
INTRAMUSCULAR | Status: DC | PRN
  Administered 2023-07-03: 25 ug via INTRAVENOUS

## 2023-07-03 MED ORDER — SODIUM CHLORIDE 0.9 % IV SOLN
INTRAVENOUS | Status: AC
  Filled 2023-07-03: qty 2

## 2023-07-03 MED ORDER — ONDANSETRON HCL 4 MG/2ML IJ SOLN: 4.0000 mg | Freq: Four times a day (QID) | INTRAMUSCULAR | Status: DC | PRN

## 2023-07-03 MED ORDER — HEPARIN (PORCINE) IN NACL 1000-0.9 UT/500ML-% IV SOLN
INTRAVENOUS | Status: DC | PRN
  Administered 2023-07-03: 500 mL

## 2023-07-03 MED ORDER — ACETAMINOPHEN 325 MG PO TABS
325.0000 mg | ORAL_TABLET | ORAL | Status: DC | PRN
  Administered 2023-07-04: 650 mg via ORAL
  Filled 2023-07-03: qty 2

## 2023-07-03 MED ORDER — PANTOPRAZOLE SODIUM 40 MG PO TBEC
40.0000 mg | DELAYED_RELEASE_TABLET | Freq: Every day | ORAL | Status: DC
  Administered 2023-07-04: 40 mg via ORAL
  Filled 2023-07-03: qty 1

## 2023-07-03 MED ORDER — MIDAZOLAM HCL 2 MG/2ML IJ SOLN
INTRAMUSCULAR | Status: AC
  Filled 2023-07-03: qty 2

## 2023-07-03 MED ORDER — CITALOPRAM HYDROBROMIDE 20 MG PO TABS
40.0000 mg | ORAL_TABLET | Freq: Every day | ORAL | Status: DC
  Administered 2023-07-04: 40 mg via ORAL
  Filled 2023-07-03: qty 2

## 2023-07-03 MED ORDER — METOPROLOL SUCCINATE ER 25 MG PO TB24: 12.5000 mg | ORAL_TABLET | Freq: Every day | ORAL | Status: DC

## 2023-07-03 MED ORDER — EZETIMIBE 10 MG PO TABS
10.0000 mg | ORAL_TABLET | Freq: Every day | ORAL | Status: DC
  Administered 2023-07-04: 10 mg via ORAL
  Filled 2023-07-03: qty 1

## 2023-07-03 MED ORDER — CEFAZOLIN SODIUM-DEXTROSE 2-4 GM/100ML-% IV SOLN
INTRAVENOUS | Status: AC
  Filled 2023-07-03: qty 100

## 2023-07-03 MED ORDER — CHLORHEXIDINE GLUCONATE 4 % EX SOLN
4.0000 | Freq: Once | CUTANEOUS | Status: DC
  Filled 2023-07-03: qty 60

## 2023-07-03 MED ORDER — CEFAZOLIN SODIUM-DEXTROSE 2-4 GM/100ML-% IV SOLN
2.0000 g | INTRAVENOUS | Status: AC
  Administered 2023-07-03: 2 g via INTRAVENOUS

## 2023-07-03 MED ORDER — POVIDONE-IODINE 10 % EX SWAB
2.0000 | Freq: Once | CUTANEOUS | Status: AC
  Administered 2023-07-03: 2 via TOPICAL

## 2023-07-03 SURGICAL SUPPLY — 9 items
CABLE SURGICAL S-101-97-12 (CABLE) ×1 IMPLANT
ICD COBALT XT DR DDPA2D4 (ICD Generator) IMPLANT
LEAD CAPSURE NOVUS 5076-52CM (Lead) IMPLANT
LEAD SPRINT QUAT SEC 6935M-62 (Lead) IMPLANT
PAD DEFIB RADIO PHYSIO CONN (PAD) ×1 IMPLANT
SHEATH 7FR PRELUDE SNAP 13 (SHEATH) IMPLANT
SHEATH 9FR PRELUDE SNAP 13 (SHEATH) IMPLANT
SHEATH PROBE COVER 6X72 (BAG) IMPLANT
TRAY PACEMAKER INSERTION (PACKS) ×1 IMPLANT

## 2023-07-03 NOTE — Discharge Instructions (Signed)
After Your ICD (Implantable Cardiac Defibrillator)   You have a [employer] ICD  ACTIVITY Do not lift your arm above shoulder height for 1 week after your procedure. After 7 days, you may progress as below.  You should remove your sling 24 hours after your procedure, unless otherwise instructed by your provider.     Monday July 10, 2023  Tuesday July 11, 2023 Wednesday July 12, 2023 Thursday July 13, 2023   Do not lift, push, pull, or carry anything over 10 pounds with the affected arm until 6 weeks (Monday August 14, 2023 ) after your procedure.   You may drive AFTER your wound check, unless you have been told otherwise by your provider.   Ask your healthcare provider when you can go back to work   INCISION/Dressing If you are on a blood thinner such as Coumadin, Xarelto, Eliquis, Plavix, or Pradaxa please confirm with your provider when this should be resumed.   If large square, outer bandage is left in place, this can be removed after 24 hours from your procedure. Do not remove steri-strips or glue as below.   Monitor your defibrillator site for redness, swelling, and drainage. Call the device clinic at [phone] if you experience these symptoms or fever/chills.  If your incision is sealed with Steri-strips or staples, you may shower 7 days after your procedure or when told by your provider. Do not remove the steri-strips or let the shower hit directly on your site. You may wash around your site with soap and water.    If you were discharged in a sling, please do not wear this during the day more than 48 hours after your surgery unless otherwise instructed. This may increase the risk of stiffness and soreness in your shoulder.   Avoid lotions, ointments, or perfumes over your incision until it is well-healed.  You may use a hot tub or a pool AFTER your wound check appointment if the incision is completely closed.  Your ICD is designed to protect you from life  threatening heart rhythms. Because of this, you may receive a shock.   1 shock with no symptoms:  Call the office during business hours. 1 shock with symptoms (chest pain, chest pressure, dizziness, lightheadedness, shortness of breath, overall feeling unwell):  Call 911. If you experience 2 or more shocks in 24 hours:  Call 911. If you receive a shock, you should not drive for 6 months per the [place] DMV IF you receive appropriate therapy from your ICD.   ICD Alerts:  Some alerts are vibratory and others beep. These are NOT emergencies. Please call our office to let us know. If this occurs at night or on weekends, it can wait until the next business day. Send a remote transmission.  If your device is capable of reading fluid status (for heart failure), you will be offered monthly monitoring to review this with you.   DEVICE MANAGEMENT Remote monitoring is used to monitor your ICD from home. This monitoring is scheduled every 91 days by our office. It allows [place] to keep an eye on the functioning of your device to ensure it is working properly. You will routinely see your Electrophysiologist annually (more often if necessary).   You should receive your ID card for your new device in 4-8 weeks. Keep this card with you at all times once received. Consider wearing a medical alert bracelet or necklace.  Your ICD  may be MRI compatible. This will be discussed at your next  office visit/wound check.  You should avoid contact with strong electric or magnetic fields.   Do not use amateur (ham) radio equipment or electric (arc) welding torches. MP3 player headphones with magnets should not be used. Some devices are safe to use if held at least 12 inches (30 cm) from your defibrillator. These include power tools, lawn mowers, and speakers. If you are unsure if something is safe to use, ask your health care provider.  When using your cell phone, hold it to the ear that is on the opposite side from the  defibrillator. Do not leave your cell phone in a pocket over the defibrillator.  You may safely use electric blankets, heating pads, computers, and microwave ovens.  Call the office right away if: You have chest pain. You feel more than one shock. You feel more short of breath than you have felt before. You feel more light-headed than you have felt before. Your incision starts to open up.  This information is not intended to replace advice given to you by your health care provider. Make sure you discuss any questions you have with your health care provider.

## 2023-07-03 NOTE — H&P (Signed)
   Cardiology Admission History and Physical   Patient ID: Stuart Harvey MRN: [id number]; DOB: [birth year]   Admission date: 07/03/2023  PCP:  Marjie Skiff, NP   [facility] Providers Cardiologist:  [name], MD   {   Chief Complaint:  VT  Patient Profile:   Stuart Harvey is a 72 y.o. male with history of syncope and inducible VT during EP study who presents to the EP lab for ICD implant today.  Past medical, surgical, social, family history reviewed.      ROS:  Please see the history of present illness.  All other ROS reviewed and negative.     Physical Exam/Data:   Vitals:   07/03/23 1420 07/03/23 1505 07/03/23 1525  BP: (!) 142/85 (!) 142/85   Pulse: 67 67   Resp: 20 20   Temp: 99.1 F (37.3 C) 99.1 F (37.3 C)   TempSrc: Temporal Temporal   SpO2: 98% 98% 95%  Weight:  91.2 kg   Height:  5\' 11"  (1.803 m)    No intake or output data in the 24 hours ending 07/03/23 1548    07/03/2023    3:05 PM 06/12/2023    1:10 PM 05/27/2023    3:51 AM  Last 3 Weights  Weight (lbs) 201 lb 202 lb 12.8 oz 204 lb 6.4 oz  Weight (kg) 91.173 kg 91.989 kg 92.715 kg     Body mass index is 28.03 kg/m.  General:  Well nourished, well developed, in no acute distress Cardiac:  normal S1, S2; RRR; no murmur  Lungs:  clear to auscultation bilaterally, no wheezing, rhonchi or rales  Psych:  Normal affect      Assessment and Plan:     #VT #Syncope Inducible during recent EP study. Plan for ICD implant today.  The patient has an ischemic CM (EF 37%), and inducible VT during EP study. At this time, he meets criteria for ICD implantation for secondary prevention of sudden death.  I have had a thorough discussion with the patient reviewing options.  The patient and their family (if available) have had opportunities to ask questions and have them answered. The patient and I have decided together through a shared decision making process to proceed with ICD  implant at this time.    Risks, benefits, alternatives to ICD implantation were discussed in detail with the patient today. The patient understands that the risks include but are not limited to bleeding, infection, pneumothorax, perforation, tamponade, vascular damage, renal failure, MI, stroke, death, inappropriate shocks, and lead dislodgement and wishes to proceed.  We will therefore schedule device implantation at the next available time.   Plan for MDT DDD ICD.  #AF   #CABG #CAD #HFrEF EF 37%. Cont GDMT.          Sheria Lang T. Lalla Brothers, MD, [facility], [facility] Cardiac Electrophysiology  For questions or updates, please contact [facility] Please consult [email or web address] for contact info under     Signed, Lanier Prude, MD  07/03/2023 3:48 PM

## 2023-07-04 ENCOUNTER — Ambulatory Visit (HOSPITAL_COMMUNITY): Payer: Medicare HMO

## 2023-07-04 ENCOUNTER — Encounter (HOSPITAL_COMMUNITY): Payer: Self-pay | Admitting: Cardiology

## 2023-07-04 DIAGNOSIS — I472 Ventricular tachycardia, unspecified: Secondary | ICD-10-CM | POA: Diagnosis not present

## 2023-07-04 DIAGNOSIS — Z95810 Presence of automatic (implantable) cardiac defibrillator: Secondary | ICD-10-CM | POA: Diagnosis not present

## 2023-07-04 DIAGNOSIS — I5022 Chronic systolic (congestive) heart failure: Secondary | ICD-10-CM | POA: Diagnosis not present

## 2023-07-04 NOTE — TOC Transition Note (Signed)
Transition of Care [facility]) - CM/SW Discharge Note   Patient Details  Name: Stuart Harvey MRN: [id number] Date of Birth: 04-Jan-1951  [facility]) CM/SW Contact:  Leone Haven, RN Phone Number: 07/04/2023, 10:39 AM   Clinical Narrative:    Patient is for dc today, wife will transport home.    Final next level of care: Home/Self Care Barriers to Discharge: No Barriers Identified   Patient Goals and CMS Choice   Choice offered to / list presented to : NA  Discharge Placement                         Discharge Plan and Services Additional resources added to the After Visit Summary for   In-house Referral: NA Discharge Planning Services: CM Consult Post Acute Care Choice: NA          DME Arranged: N/A DME Agency: NA       HH Arranged: NA          Social Determinants of Health (SDOH) Interventions SDOH Screenings   Food Insecurity: No Food Insecurity (05/25/2023)  Housing: Low Risk  (05/25/2023)  Transportation Needs: No Transportation Needs (05/25/2023)  Utilities: Not At Risk (05/25/2023)  Alcohol Screen: Low Risk  (08/09/2022)  Depression (PHQ2-9): Low Risk  (06/12/2023)  Financial Resource Strain: Low Risk  (08/09/2022)  Physical Activity: Insufficiently Active (08/09/2022)  Social Connections: Socially Integrated (08/09/2022)  Stress: No Stress Concern Present (08/09/2022)  Tobacco Use: High Risk (06/12/2023)     Readmission Risk Interventions    05/20/2023    3:29 PM  Readmission Risk Prevention Plan  Transportation Screening Complete  PCP or Specialist Appt within 5-7 Days Complete  Home Care Screening Complete  Medication Review (RN CM) Complete

## 2023-07-04 NOTE — Discharge Summary (Signed)
ELECTROPHYSIOLOGY PROCEDURE DISCHARGE SUMMARY    Patient ID: Stuart Harvey,  MRN: [id number], DOB/AGE: Jun 19, 1951 72 y.o.  Admit date: 07/03/2023 Discharge date: 07/04/2023  Primary Care Physician: Marjie Skiff, NP  Primary Cardiologist: Dr. Kirke Corin Electrophysiologist: Dr. Lalla Brothers  Primary Discharge Diagnosis:  VT Cardiac arrest  Secondary Discharge Diagnosis:  CAD H/o CABG HFrEF Ischemic cardiomyopathy Chronic CHF compensated AFib CHA2DS2Vasc is 3 On Eliquis HTN  Allergies  Allergen Reactions   Benzodiazepines     Paradoxical agitation and delirium   Codeine Nausea Only   Tetracycline Rash     Procedures This Admission:  1.  Implantation of a MDT dual chamber ICD on 07/03/23 by Dr Lalla Brothers.   DFT's were deferred at time of implant.   There were no immediate post procedure complications. 2.  CXR on 07/04/23 demonstrated no pneumothorax status post device implantation.   Brief HPI: LLIAM MARELLA is a 71 y.o. malepast medical history includes above, with hx of cardiac arrest and inducible VT on EP.    Risks, benefits, and alternatives to ICD implantation were reviewed with the patient who wished to proceed.   Hospital Course:  The patient was admitted and underwent implantation of an ICD with details as outlined above. He was monitored on telemetry overnight which demonstrated SR.  Left chest was without hematoma or ecchymosis.  The device was interrogated and found to be functioning normally.  CXR was obtained and demonstrated no pneumothorax status post device implantation.  Wound care, arm mobility, and restrictions were reviewed with the patient.  The patient feels well, denies any CP or SOB, minimal site discomfort, he was examined by Dr. Lalla Brothers and considered stable for discharge to home.   Resume Eliquis on 07/09/23 No driving 6 month (from July 4th/2024), pt is aware   Physical Exam: Vitals:   07/03/23 1638 07/03/23 1643 07/03/23 2034  07/04/23 0436  BP: 106/74 120/72 117/67 116/69  Pulse: 63 63 (!) 58 (!) 55  Resp: (!) 23 18 18 18   Temp:   97.8 F (36.6 C) 97.9 F (36.6 C)  TempSrc:   Oral Oral  SpO2: 94% 94% 98% 97%  Weight:      Height:        GEN- The patient is well appearing, alert and oriented x 3 today.   HEENT: normocephalic, atraumatic; sclera clear, conjunctiva pink; hearing intact; oropharynx clear Lungs-  CTA b/l, normal work of breathing.  No wheezes, rales, rhonchi Heart-  RRR, no murmurs, rubs or gallops, PMI not laterally displaced GI- soft, non-tender, non-distended Extremities- no clubbing, cyanosis, or edema MS- no significant deformity or atrophy Skin- warm and dry, no rash or lesion, left chest without hematoma/ecchymosis Psych- euthymic mood, full affect Neuro- no gross defecits  Labs:   Lab Results  Component Value Date   WBC 9.9 06/12/2023   HGB 14.1 06/12/2023   HCT 43.1 06/12/2023   MCV 92 06/12/2023   PLT 217 06/12/2023   No results for input(s): "NA", "K", "CL", "CO2", "BUN", "CREATININE", "CALCIUM", "PROT", "BILITOT", "ALKPHOS", "ALT", "AST", "GLUCOSE" in the last 168 hours.  Invalid input(s): "LABALBU"  Discharge Medications:  Allergies as of 07/04/2023       Reactions   Benzodiazepines    Paradoxical agitation and delirium   Codeine Nausea Only   Tetracycline Rash        Medication List     TAKE these medications    acetaminophen 500 MG tablet Commonly known as: TYLENOL Take  1,000 mg by mouth every 6 (six) hours as needed for moderate pain.   amiodarone 200 MG tablet Commonly known as: Pacerone Take 2 tablets (400mg ) twice daily for 5 days, then 1 tablet (200mg ) twice daily for 5 days, and then 1 table (200mg ) once daily. What changed:  how much to take how to take this when to take this additional instructions   apixaban 5 MG Tabs tablet Commonly known as: Eliquis Take 1 tablet (5 mg total) by mouth 2 (two) times daily. Notes to patient: Resume on  07/09/23   buPROPion 200 MG 12 hr tablet Commonly known as: WELLBUTRIN SR Take 1 tablet (200 mg total) by mouth 2 (two) times daily.   Cholecalciferol 50 MCG (2000 UT) Caps Take 1 capsule by mouth daily.   citalopram 40 MG tablet Commonly known as: CELEXA Take 40 mg by mouth daily.   Entresto 24-26 MG Generic drug: sacubitril-valsartan Take 1 tablet by mouth 2 (two) times daily.   ezetimibe 10 MG tablet Commonly known as: ZETIA Take 1 tablet (10 mg total) by mouth daily.   levothyroxine 75 MCG tablet Commonly known as: SYNTHROID Take 75 mcg by mouth daily before breakfast.   levothyroxine 100 MCG tablet Commonly known as: SYNTHROID Take 1 tablet (100 mcg total) by mouth daily.   lidocaine 5 % Commonly known as: Lidoderm Place 1 patch onto the skin daily. Remove & Discard patch within 12 hours or as directed by MD   metoprolol succinate 25 MG 24 hr tablet Commonly known as: Toprol XL Take 0.5 tablets (12.5 mg total) by mouth daily.   nitroGLYCERIN 0.4 MG SL tablet Commonly known as: NITROSTAT Place 1 tablet (0.4 mg total) under the tongue every 5 (five) minutes x 3 doses as needed for chest pain.   omeprazole 20 MG capsule Commonly known as: PRILOSEC Take 1 capsule (20 mg total) by mouth daily.   OVER THE COUNTER MEDICATION Take 1-2 tablets by mouth at bedtime as needed (sleep). Relaxium sleep aid   rosuvastatin 40 MG tablet Commonly known as: CRESTOR Take 1 tablet (40 mg total) by mouth daily.        Disposition:  Discharge Instructions     Diet - low sodium heart healthy   Complete by: As directed    Increase activity slowly   Complete by: As directed         Duration of Discharge Encounter: Greater than 30 minutes including physician time.  Norma Fredrickson, PA-C 07/04/2023 10:05 AM

## 2023-07-04 NOTE — TOC Initial Note (Signed)
Transition of Care [facility]) - Initial/Assessment Note    Patient Details  Name: Stuart Harvey MRN: [id number] Date of Birth: 04-Jan-1951  Transition of Care [facility]) CM/SW Contact:    Leone Haven, RN Phone Number: 07/04/2023, 10:38 AM  Clinical Narrative:                 From home with spouse, has PCP and insurance on file, states has no HH services in place at this time or DME at home. Wife states she  will transport him home at dc and she is support system, states gets medications from  [facility] in [place] self ambulatory   Expected Discharge Plan: Home/Self Care Barriers to Discharge: No Barriers Identified   Patient Goals and CMS Choice Patient states their goals for this hospitalization and ongoing recovery are:: return home   Choice offered to / list presented to : NA      Expected Discharge Plan and Services In-house Referral: NA Discharge Planning Services: CM Consult Post Acute Care Choice: NA Living arrangements for the past 2 months: Single Family Home Expected Discharge Date: 07/04/23               DME Arranged: N/A DME Agency: NA       HH Arranged: NA          Prior Living Arrangements/Services Living arrangements for the past 2 months: Single Family Home Lives with:: Spouse Patient language and need for interpreter reviewed:: Yes Do you feel safe going back to the place where you live?: Yes      Need for Family Participation in Patient Care: Yes (Comment) Care giver support system in place?: Yes (comment)   Criminal Activity/Legal Involvement Pertinent to Current Situation/Hospitalization: No - Comment as needed  Activities of Daily Living      Permission Sought/Granted Permission sought to share information with : Case Manager Permission granted to share information with : Yes, Verbal Permission Granted              Emotional Assessment       Orientation: : Oriented to Self, Oriented to Place, Oriented to  Time,  Oriented to Situation   Psych Involvement: No (comment)  Admission diagnosis:  ICD (implantable cardioverter-defibrillator) in place [Z95.810] Patient Active Problem List   Diagnosis Date Noted   ICD (implantable cardioverter-defibrillator) in place 07/03/2023   Ischemic cardiomyopathy 05/27/2023   Acute respiratory failure (HCC) 05/19/2023   Cardiac arrest (HCC) 05/18/2023   Heart failure with reduced ejection fraction (HCC)    Hx of CABG    Adenomatous polyp of rectum    BPH (benign prostatic hyperplasia) 01/05/2021   History of MI (myocardial infarction) 12/27/2019   Senile purpura (HCC) 08/20/2018   Paroxysmal atrial fibrillation (HCC) 06/14/2018   VT (ventricular tachycardia) (HCC)    Elevated PSA 07/10/2017   Varicose veins of both lower extremities with inflammation 06/13/2017   Counseling regarding advanced directives and goals of care 01/02/2017   CKD (chronic kidney disease) stage 3, GFR 30-59 ml/min (HCC) 12/18/2015   IFG (impaired fasting glucose) 12/18/2015   Hypothyroid 12/18/2015   Renal mass 08/17/2015   H/O total knee replacement 08/28/2014   Arthritis of knee, degenerative 05/05/2014   Depression 08/28/2012   Coronary artery disease    Benign hypertension with CKD (chronic kidney disease) stage III (HCC)    Hyperlipidemia    AAA (abdominal aortic aneurysm) (HCC)    PCP:  Marjie Skiff, NP Pharmacy:   [facility] [address] [place], [place] - [address] [place] [address] Phone: [phone] Fax: [phone]  [facility] [id number] [place], [place] - [address] [place] [address] Phone: [phone] Fax: [phone]  [facility] - [place], [place] - [address] [place] [address] Phone: [phone] Fax: [phone]  [id number] - [place], [place] - [address] AT [facility] [address] [place] [address] Phone:  [phone] Fax: [phone]     Social Determinants of Health (SDOH) Social History: SDOH Screenings   Food Insecurity: No Food Insecurity (05/25/2023)  Housing: Low Risk  (05/25/2023)  Transportation Needs: No Transportation Needs (05/25/2023)  Utilities: Not At Risk (05/25/2023)  Alcohol Screen: Low Risk  (08/09/2022)  Depression (PHQ2-9): Low Risk  (06/12/2023)  Financial Resource Strain: Low Risk  (08/09/2022)  Physical Activity: Insufficiently Active (08/09/2022)  Social Connections: Socially Integrated (08/09/2022)  Stress: No Stress Concern Present (08/09/2022)  Tobacco Use: High Risk (06/12/2023)   SDOH Interventions:     Readmission Risk Interventions    05/20/2023    3:29 PM  Readmission Risk Prevention Plan  Transportation Screening Complete  PCP or Specialist Appt within 5-7 Days Complete  Home Care Screening Complete  Medication Review (RN CM) Complete

## 2023-07-05 ENCOUNTER — Ambulatory Visit: Payer: Medicare HMO

## 2023-07-05 ENCOUNTER — Telehealth: Payer: Self-pay

## 2023-07-05 MED FILL — Midazolam HCl Inj 2 MG/2ML (Base Equivalent): INTRAMUSCULAR | Qty: 2 | Status: AC

## 2023-07-05 NOTE — Telephone Encounter (Signed)
Noted  

## 2023-07-05 NOTE — Transitions of Care (Post Inpatient/ED Visit) (Signed)
07/05/2023  Name: Stuart Harvey MRN: [id number] DOB: June 09, 1951  Today's TOC FU Call Status: Today's TOC FU Call Status:: Successful TOC FU Call Completed TOC FU Call Complete Date: 07/05/23  Transition Care Management Follow-up Telephone Call Date of Discharge: 07/04/23 Discharge Facility: [name]) Type of Discharge: Inpatient Admission Primary Inpatient Discharge Diagnosis:: VT How have you been since you were released from the hospital?: Better Any questions or concerns?: No  Items Reviewed: Did you receive and understand the discharge instructions provided?: Yes Medications obtained,verified, and reconciled?: Yes (Medications Reviewed) Any new allergies since your discharge?: No Dietary orders reviewed?: Yes Do you have support at home?: Yes People in Home: spouse  Medications Reviewed Today: Medications Reviewed Today     Reviewed by Karena Addison, LPN (Licensed Practical Nurse) on 07/05/23 at [phone]  Med List Status: <None>   Medication Order Taking? Sig Documenting Provider Last Dose Status Informant  acetaminophen (TYLENOL) 500 MG tablet [id number] No Take 1,000 mg by mouth every 6 (six) hours as needed for moderate pain. [provider] 07/02/2023 Active Spouse/Significant Other  amiodarone (PACERONE) 200 MG tablet [id number] No Take 2 tablets (400mg ) twice daily for 5 days, then 1 tablet (200mg ) twice daily for 5 days, and then 1 table (200mg ) once daily.  Patient taking differently: Take 200 mg by mouth daily.   Corrin Parker, PA-C 07/03/2023 Active Spouse/Significant Other  apixaban (ELIQUIS) 5 MG TABS tablet [id number] No Take 1 tablet (5 mg total) by mouth 2 (two) times daily. Sondra Barges, PA-C 06/30/2023 Active Spouse/Significant Other  buPROPion (WELLBUTRIN SR) 200 MG 12 hr tablet [id number] No Take 1 tablet (200 mg total) by mouth 2 (two) times daily. Aura Dials T, NP 07/03/2023 Active Spouse/Significant Other  Cholecalciferol 50 MCG (2000  UT) CAPS [id number] No Take 1 capsule by mouth daily.  [provider] 07/03/2023 Active Spouse/Significant Other  citalopram (CELEXA) 40 MG tablet [id number] No Take 40 mg by mouth daily. [provider] 07/03/2023 Active Spouse/Significant Other  ezetimibe (ZETIA) 10 MG tablet [id number] No Take 1 tablet (10 mg total) by mouth daily. Aura Dials T, NP 07/03/2023 Active Spouse/Significant Other  levothyroxine (SYNTHROID) 100 MCG tablet [id number]  Take 1 tablet (100 mcg total) by mouth daily. Aura Dials T, NP  Active Spouse/Significant Other           Med Note Sherrie Mustache, Ronaldo Miyamoto A   Wed Jun 14, 2023 11:05 AM) Hasn't started, plans to finish 75 mcg strength before switching  levothyroxine (SYNTHROID) 75 MCG tablet [id number] No Take 75 mcg by mouth daily before breakfast. [provider] 07/03/2023 Active Spouse/Significant Other  lidocaine (LIDODERM) 5 % [id number]  Place 1 patch onto the skin daily. Remove & Discard patch within 12 hours or as directed by MD Marjie Skiff, NP  Active Spouse/Significant Other           Med Note Sherrie Mustache, KYLE A   Wed Jun 14, 2023 11:07 AM) Hasn't started  metoprolol succinate (TOPROL XL) 25 MG 24 hr tablet [id number] No Take 0.5 tablets (12.5 mg total) by mouth daily. Sondra Barges, PA-C 07/03/2023 Active Spouse/Significant Other  nitroGLYCERIN (NITROSTAT) 0.4 MG SL tablet [id number] No Place 1 tablet (0.4 mg total) under the tongue every 5 (five) minutes x 3 doses as needed for chest pain. Corrin Parker, PA-C Taking Active Spouse/Significant Other  omeprazole (PRILOSEC) 20 MG capsule [id number] No Take 1 capsule (20 mg total) by mouth daily. Aura Dials T, NP 07/03/2023 Active  Spouse/Significant Other  OVER THE COUNTER MEDICATION [id number] No Take 1-2 tablets by mouth at bedtime as needed (sleep). Relaxium sleep aid [provider] Past Week Active Spouse/Significant Other  rosuvastatin (CRESTOR) 40 MG tablet [id number] No Take 1  tablet (40 mg total) by mouth daily. [place] Ouch, MD 07/03/2023 Active Spouse/Significant Other  sacubitril-valsartan (ENTRESTO) 24-26 MG [id number] No Take 1 tablet by mouth 2 (two) times daily. Corrin Parker, PA-C 07/03/2023 Active Spouse/Significant Other            Home Care and Equipment/Supplies: Were Home Health Services Ordered?: NA Has Agency set up a time to come to your home?: No Any new equipment or medical supplies ordered?: NA  Functional Questionnaire: Do you need assistance with bathing/showering or dressing?: No Do you need assistance with meal preparation?: No Do you need assistance with eating?: No Do you have difficulty maintaining continence: No Do you need assistance with getting out of bed/getting out of a chair/moving?: No Do you have difficulty managing or taking your medications?: No  Follow up appointments reviewed: PCP Follow-up appointment confirmed?: NA Specialist Hospital Follow-up appointment confirmed?: Yes Date of Specialist follow-up appointment?: 07/19/23 Follow-Up Specialty Provider:: cardio Do you need transportation to your follow-up appointment?: No Do you understand care options if your condition(s) worsen?: Yes-patient verbalized understanding    SIGNATURE Karena Addison, LPN [facility] Nurse Health Advisor Direct Dial [phone]

## 2023-07-07 ENCOUNTER — Other Ambulatory Visit (INDEPENDENT_AMBULATORY_CARE_PROVIDER_SITE_OTHER): Payer: Self-pay | Admitting: Vascular Surgery

## 2023-07-07 DIAGNOSIS — I7143 Infrarenal abdominal aortic aneurysm, without rupture: Secondary | ICD-10-CM

## 2023-07-12 ENCOUNTER — Ambulatory Visit: Payer: Medicare HMO | Admitting: Nurse Practitioner

## 2023-07-18 ENCOUNTER — Ambulatory Visit (INDEPENDENT_AMBULATORY_CARE_PROVIDER_SITE_OTHER): Payer: Medicare HMO | Admitting: Vascular Surgery

## 2023-07-18 ENCOUNTER — Ambulatory Visit (INDEPENDENT_AMBULATORY_CARE_PROVIDER_SITE_OTHER): Payer: Medicare HMO

## 2023-07-18 ENCOUNTER — Encounter (INDEPENDENT_AMBULATORY_CARE_PROVIDER_SITE_OTHER): Payer: Self-pay | Admitting: Vascular Surgery

## 2023-07-18 VITALS — BP 93/60 | HR 67 | Resp 18 | Ht 71.0 in | Wt 202.2 lb

## 2023-07-18 DIAGNOSIS — N1831 Chronic kidney disease, stage 3a: Secondary | ICD-10-CM | POA: Diagnosis not present

## 2023-07-18 DIAGNOSIS — E785 Hyperlipidemia, unspecified: Secondary | ICD-10-CM | POA: Diagnosis not present

## 2023-07-18 DIAGNOSIS — I7143 Infrarenal abdominal aortic aneurysm, without rupture: Secondary | ICD-10-CM | POA: Diagnosis not present

## 2023-07-18 DIAGNOSIS — I129 Hypertensive chronic kidney disease with stage 1 through stage 4 chronic kidney disease, or unspecified chronic kidney disease: Secondary | ICD-10-CM | POA: Diagnosis not present

## 2023-07-18 DIAGNOSIS — N183 Chronic kidney disease, stage 3 unspecified: Secondary | ICD-10-CM

## 2023-07-18 NOTE — Progress Notes (Signed)
MRN : [id number]  Stuart Harvey is a 72 y.o. ([birth year]) male who presents with chief complaint of  Chief Complaint  Patient presents with   Follow-up    6 month follow up see  .  History of Present Illness: Patient returns today in follow up of his abdominal aortic and right iliac artery aneurysm.  He is many years status post initial endovascular repair and now 2 years status post coil embolization of the right hypogastric artery with extension of the iliac stent into the external iliac artery.  He does still have some right hip and buttock claudication symptoms but otherwise has done well from this.  No aneurysm related symptoms such as back or abdominal pain or signs of peripheral embolization.  Duplex today shows mild dilatation of the right iliac system which is likely the stent graft limb up to 17 mm in diameter.  The aorta measures only about 3 cm in diameter.  The stent graft is patent without obvious endoleak.  Current Outpatient Medications  Medication Sig Dispense Refill   acetaminophen (TYLENOL) 500 MG tablet Take 1,000 mg by mouth every 6 (six) hours as needed for moderate pain.     amiodarone (PACERONE) 200 MG tablet Take 2 tablets (400mg ) twice daily for 5 days, then 1 tablet (200mg ) twice daily for 5 days, and then 1 table (200mg ) once daily. (Patient taking differently: Take 200 mg by mouth daily.) 50 tablet 0   apixaban (ELIQUIS) 5 MG TABS tablet Take 1 tablet (5 mg total) by mouth 2 (two) times daily. 180 tablet 3   buPROPion (WELLBUTRIN SR) 200 MG 12 hr tablet Take 1 tablet (200 mg total) by mouth 2 (two) times daily. 180 tablet 4   Cholecalciferol 50 MCG (2000 UT) CAPS Take 1 capsule by mouth daily.      citalopram (CELEXA) 40 MG tablet Take 40 mg by mouth daily.     ezetimibe (ZETIA) 10 MG tablet Take 1 tablet (10 mg total) by mouth daily. 90 tablet 1   levothyroxine (SYNTHROID) 100 MCG tablet Take 1 tablet (100 mcg total) by mouth daily. 60 tablet 2   levothyroxine  (SYNTHROID) 75 MCG tablet Take 75 mcg by mouth daily before breakfast.     lidocaine (LIDODERM) 5 % Place 1 patch onto the skin daily. Remove & Discard patch within 12 hours or as directed by MD 30 patch 0   metoprolol succinate (TOPROL XL) 25 MG 24 hr tablet Take 0.5 tablets (12.5 mg total) by mouth daily. 90 tablet 3   nitroGLYCERIN (NITROSTAT) 0.4 MG SL tablet Place 1 tablet (0.4 mg total) under the tongue every 5 (five) minutes x 3 doses as needed for chest pain. 25 tablet 2   omeprazole (PRILOSEC) 20 MG capsule Take 1 capsule (20 mg total) by mouth daily. 90 capsule 4   OVER THE COUNTER MEDICATION Take 1-2 tablets by mouth at bedtime as needed (sleep). Relaxium sleep aid     rosuvastatin (CRESTOR) 40 MG tablet Take 1 tablet (40 mg total) by mouth daily. 90 tablet 1   sacubitril-valsartan (ENTRESTO) 24-26 MG Take 1 tablet by mouth 2 (two) times daily.     No current facility-administered medications for this visit.    Past Medical History:  Diagnosis Date   AAA (abdominal aortic aneurysm) (HCC)    a.) s/p EVAR in 08/2007 by Dr. Earnestine Leys. b.) CTA on 06/30/2021 --> interval aneurysmal dilitation superior to stent graft with the juxtarenal aorta measuring 3.8 cm.  Anemia    Arthritis    both knees   Chronic anticoagulation    Apixaban   Chronic HFrEF (heart failure with reduced ejection fraction) (HCC)    a. 07/2022 Echo: EF 35-40%, glob HK, mild LVH, GrI DD, nl RV fxn, mildly dil RA/LA, mild MR, mild AoV sclerosis. Asc Ao 40mm.   CKD (chronic kidney disease), stage II    Coronary artery disease    a. 06/2007 CABGx4 @ [facility] (LIMA-LAD, VG-RPDA, VG-OM3,VG-D2); b. 2011 Cath: patent grafts EF 40%, c.06/05/18 Cath: Sev native dzs, LIMA-LAD patent, VG-RPDA patent, VG-OM3 mid-graft 60% and 80%; d. 07/2022 Cath: LM nl, LAD 50p, 90p/m, D1 70, RI  30, LCX 40ost/p, 100p CTO, OM3 100, RCA 100p CTO, LIMA->LAD ok, VG->D2 ok, VG->RPDA min irregs, VG->OM3 100->Med rx.   Depression    GERD (gastroesophageal  reflux disease)    Hernia of abdominal cavity    History of 2019 novel coronavirus disease (COVID-19) 09/2019   Hyperlipidemia    Hypertension    Hypothyroidism    Iliac artery aneurysm, right (HCC)    a.) at the distal landing zone of the right iliac limb measuring 3 cm   Ischemic cardiomyopathy    Right middle lobe pulmonary nodule 06/30/2021   a.) measured 8 mm by CT on 06/30/2021.   S/P CABG x 4 06/25/2007   a.) LIMA-LAD, SVG-RPDA, SVG-OM3, SVG-D2   Sigmoid diverticulosis    Ventricular tachycardia (HCC) 05/2018   a.) found unresponsive by EMS; VT noted --> defibrillation achieved ROSC. Briefly intubated. Episode felt to be secondary to heat stoke.    Past Surgical History:  Procedure Laterality Date   ABDOMINAL AORTIC ANEURYSM REPAIR N/A 08/29/2007   Procedure: EVAR; Location: [facility]; Surgeon: Brynda Greathouse, MD   ABDOMINAL AORTIC ANEURYSM REPAIR N/A 12/05/2012   Procedure: Abdominal aortic aneurysm of approximately 6 cm in maximal diameter status post previous endovascular repair with type I and III endoleaks; Location: [facility]; Surgeon: Festus Barren, MD   CARDIAC CATHETERIZATION Left 06/28/2010   3v CAD with patent CABG grafts; LVEF 40%; stable aortic stent graft; Location: [facility]; Surgeon: Arnoldo Hooker, MD   CARDIAC CATHETERIZATION Left 06/18/2007   3v CAD; LVEF 50%; refer to CVTS for CABG; Location: [facility]; Surgeon: [name], MD   CARDIOVERSION N/A 05/22/2023   Procedure: CARDIOVERSION;  Surgeon: Iran Ouch, MD;  Location: [facility] ORS;  Service: Cardiovascular;  Laterality: N/A;   COLONOSCOPY WITH PROPOFOL N/A 11/17/2021   Procedure: COLONOSCOPY WITH PROPOFOL;  Surgeon: Toney Reil, MD;  Location: [facility] ENDOSCOPY;  Service: Gastroenterology;  Laterality: N/A;   CORONARY ARTERY BYPASS GRAFT N/A 06/25/2007   Procedure: 4v CABG (LIMA-LAD, SVG-RPDA, SVG-OM3, SVG-D2); Location: [facility]; Surgeon: Marshell Garfinkel, MD   CORONARY/GRAFT ANGIOGRAPHY N/A 06/05/2018   Procedure:  Rosalie Doctor ANGIOGRAPHY;  Surgeon: Yvonne Kendall, MD;  Location: [facility] INVASIVE CV LAB;  Service: Cardiovascular;  Laterality: N/A;   ELECTROPHYSIOLOGY STUDY N/A 05/26/2023   Procedure: ELECTROPHYSIOLOGY STUDY;  Surgeon: Lanier Prude, MD;  Location: [facility] INVASIVE CV LAB;  Service: Cardiovascular;  Laterality: N/A;   ENDOVASCULAR REPAIR/STENT GRAFT N/A 08/04/2021   Procedure: ENDOVASCULAR REPAIR/STENT GRAFT;  Surgeon: Annice Needy, MD;  Location: [facility] INVASIVE CV LAB;  Service: Cardiovascular;  Laterality: N/A;   EXPLORATORY LAPAROTOMY  1997   HERNIA REPAIR     ICD IMPLANT N/A 07/03/2023   Procedure: ICD IMPLANT;  Surgeon: Lanier Prude, MD;  Location: [facility] INVASIVE CV LAB;  Service: Cardiovascular;  Laterality: N/A;   KNEE ARTHROPLASTY Left 09/19/2016  Procedure: COMPUTER ASSISTED TOTAL KNEE ARTHROPLASTY;  Surgeon: Donato [name], MD;  Location: [facility] ORS;  Service: Orthopedics;  Laterality: Left;   RIGHT/LEFT HEART CATH AND CORONARY ANGIOGRAPHY N/A 08/05/2022   Procedure: RIGHT/LEFT HEART CATH AND CORONARY ANGIOGRAPHY;  Surgeon: Iran Ouch, MD;  Location: [facility] INVASIVE CV LAB;  Service: Cardiovascular;  Laterality: N/A;   TOTAL KNEE ARTHROPLASTY Right 09/2016     Social History   Tobacco Use   Smoking status: Former    Current packs/day: 0.00    Average packs/day: 1 pack/day for 35.0 years (35.0 ttl pk-yrs)    Types: Cigarettes    Start date: 12/16/1970    Quit date: 12/16/2005    Years since quitting: 17.5    Passive exposure: Past   Smokeless tobacco: Current    Types: Chew   Tobacco comments:    pt states he very rarely uses smokeless tobacco   Vaping Use   Vaping status: Never Used  Substance Use Topics   Alcohol use: No   Drug use: No       Family History  Problem Relation Age of Onset   Heart disease Mother    Heart attack Mother    Hypertension Mother    Heart attack Father    Hypertension Father    Kidney disease Brother    Depression Maternal  Grandmother    Depression Maternal Grandfather    Heart disease Other    Heart attack Other    Prostate cancer Neg Hx    Kidney cancer Neg Hx    Stomach cancer Neg Hx    Colon cancer Neg Hx      Allergies  Allergen Reactions   Benzodiazepines     Paradoxical agitation and delirium   Codeine Nausea Only   Tetracycline Rash     REVIEW OF SYSTEMS (Negative unless checked)  Constitutional: [] Weight loss  [] Fever  [] Chills Cardiac: [] Chest pain   [] Chest pressure   [x] Palpitations   [] Shortness of breath when laying flat   [] Shortness of breath at rest   [x] Shortness of breath with exertion. Vascular:  [] Pain in legs with walking   [] Pain in legs at rest   [] Pain in legs when laying flat   [] Claudication   [] Pain in feet when walking  [] Pain in feet at rest  [] Pain in feet when laying flat   [] History of DVT   [] Phlebitis   [] Swelling in legs   [] Varicose veins   [] Non-healing ulcers Pulmonary:   [] Uses home oxygen   [] Productive cough   [] Hemoptysis   [] Wheeze  [] COPD   [] Asthma Neurologic:  [] Dizziness  [] Blackouts   [] Seizures   [] History of stroke   [] History of TIA  [] Aphasia   [] Temporary blindness   [] Dysphagia   [] Weakness or numbness in arms   [] Weakness or numbness in legs Musculoskeletal:  [x] Arthritis   [] Joint swelling   [x] Joint pain   [] Low back pain Hematologic:  [] Easy bruising  [] Easy bleeding   [] Hypercoagulable state   [] Anemic   Gastrointestinal:  [] Blood in stool   [] Vomiting blood  [] Gastroesophageal reflux/heartburn   [] Abdominal pain Genitourinary:  [x] Chronic kidney disease   [] Difficult urination  [] Frequent urination  [] Burning with urination   [] Hematuria Skin:  [] Rashes   [] Ulcers   [] Wounds Psychological:  [] History of anxiety   []  History of major depression.  Physical Examination  BP 93/60 (BP Location: Left Arm)   Pulse 67   Resp 18   Ht 5\' 11"  (1.803 m)  Wt 202 lb 3.2 oz (91.7 kg)   BMI 28.20 kg/m  Gen:  WD/WN, NAD Head: [place]/AT, No temporalis  wasting. Ear/Nose/Throat: Hearing grossly intact, nares w/o erythema or drainage Eyes: Conjunctiva clear. Sclera non-icteric Neck: Supple.  Trachea midline Pulmonary:  Good air movement, no use of accessory muscles.  Cardiac: Irregular Vascular:  Vessel Right Left  Radial Palpable Palpable                                   Gastrointestinal: soft, non-tender/non-distended. No guarding/reflex.  Musculoskeletal: M/S 5/5 throughout.  No deformity or atrophy. No edema. Neurologic: Sensation grossly intact in extremities.  Symmetrical.  Speech is fluent.  Psychiatric: Judgment intact, Mood & affect appropriate for pt's clinical situation. Dermatologic: No rashes or ulcers noted.  No cellulitis or open wounds.      Labs Recent Results (from the past 2160 hour(s))  Comprehensive metabolic panel     Status: Abnormal   Collection Time: 05/18/23  4:27 PM  Result Value Ref Range   Sodium 139 135 - 145 mmol/L   Potassium 5.7 (H) 3.5 - 5.1 mmol/L   Chloride 112 (H) 98 - 111 mmol/L   CO2 10 (L) 22 - 32 mmol/L   Glucose, Bld 160 (H) 70 - 99 mg/dL    Comment: Glucose reference range applies only to samples taken after fasting for at least 8 hours.   BUN 37 (H) 8 - 23 mg/dL   Creatinine, Ser 0.98 (H) 0.61 - 1.24 mg/dL   Calcium 9.7 8.9 - 11.9 mg/dL   Total Protein 7.5 6.5 - 8.1 g/dL   Albumin 4.3 3.5 - 5.0 g/dL   AST 43 (H) 15 - 41 U/L   ALT 20 0 - 44 U/L   Alkaline Phosphatase 103 38 - 126 U/L   Total Bilirubin 1.0 0.3 - 1.2 mg/dL   GFR, Estimated 35 (L) >60 mL/min    Comment: (NOTE) Calculated using the CKD-EPI Creatinine Equation (2021)    Anion gap 17 (H) 5 - 15    Comment: Performed at [facility], [address] [place], [place] [address]  CBC with Differential     Status: Abnormal   Collection Time: 05/18/23  4:27 PM  Result Value Ref Range   WBC 18.2 (H) 4.0 - 10.5 K/uL   RBC 5.13 4.22 - 5.81 MIL/uL   Hemoglobin 15.5 13.0 - 17.0 g/dL   HCT 95.6 21.3 -  08.6 %   MCV 94.2 80.0 - 100.0 fL   MCH 30.2 26.0 - 34.0 pg   MCHC 32.1 30.0 - 36.0 g/dL   RDW 57.8 46.9 - 62.9 %   Platelets 201 150 - 400 K/uL   nRBC 0.2 0.0 - 0.2 %   Neutrophils Relative % 32 %   Neutro Abs 5.9 1.7 - 7.7 K/uL   Lymphocytes Relative 58 %   Lymphs Abs 10.5 (H) 0.7 - 4.0 K/uL   Monocytes Relative 5 %   Monocytes Absolute 1.0 0.1 - 1.0 K/uL   Eosinophils Relative 3 %   Eosinophils Absolute 0.5 0.0 - 0.5 K/uL   Basophils Relative 0 %   Basophils Absolute 0.1 0.0 - 0.1 K/uL   WBC Morphology REACTICVE LYMPHOCYTES    Immature Granulocytes 2 %   Abs Immature Granulocytes 0.27 (H) 0.00 - 0.07 K/uL    Comment: Performed at [facility] [address], [place], [place] [address]  Troponin  I (High Sensitivity)     Status: Abnormal   Collection Time: 05/18/23  4:27 PM  Result Value Ref Range   Troponin I (High Sensitivity) 21 (H) <18 ng/L    Comment: (NOTE) Elevated high sensitivity troponin I (hsTnI) values and significant  changes across serial measurements may suggest ACS but many other  chronic and acute conditions are known to elevate hsTnI results.  Refer to the "Links" section for chest pain algorithms and additional  guidance. Performed at [facility], [address] [place], [place] [address]   Lactic acid, plasma     Status: Abnormal   Collection Time: 05/18/23  4:27 PM  Result Value Ref Range   Lactic Acid, Venous >9.0 (HH) 0.5 - 1.9 mmol/L    Comment: CRITICAL RESULT CALLED TO, READ BACK BY AND VERIFIED WITH ANGELA ROBBINS @1706  05/18/23 MJU Performed at [facility], [address] [place], [place] [address]   Acetaminophen level     Status: Abnormal   Collection Time: 05/18/23  4:27 PM  Result Value Ref Range   Acetaminophen (Tylenol), Serum <10 (L) 10 - 30 ug/mL    Comment: (NOTE) Therapeutic concentrations vary significantly. A range of 10-30 ug/mL  may be an effective concentration for many patients. However, some   are best treated at concentrations outside of this range. Acetaminophen concentrations >150 ug/mL at 4 hours after ingestion  and >50 ug/mL at 12 hours after ingestion are often associated with  toxic reactions.  Performed at [facility], [address] [place], [place] [address]   Salicylate level     Status: Abnormal   Collection Time: 05/18/23  4:27 PM  Result Value Ref Range   Salicylate Lvl <7.0 (L) 7.0 - 30.0 mg/dL    Comment: Performed at [facility] [address] [place], [place] [address]  Protime-INR     Status: Abnormal   Collection Time: 05/18/23  4:27 PM  Result Value Ref Range   Prothrombin Time 49.6 (H) 11.4 - 15.2 seconds   INR 5.4 (HH) 0.8 - 1.2    Comment: REPEATED TO VERIFY CRITICAL RESULT CALLED TO, READ BACK BY AND VERIFIED WITH: Particia Lather @1744  ON 05/18/23 SKL Performed at [facility], [address] [place], [place] [address]   Brain natriuretic peptide     Status: Abnormal   Collection Time: 05/18/23  4:27 PM  Result Value Ref Range   B Natriuretic Peptide 513.5 (H) 0.0 - 100.0 pg/mL    Comment: Performed at [facility], [address] [place], [place] [address]  SARS Coronavirus 2 by RT PCR (hospital order, performed in [facility] hospital lab) *cepheid single result test* Anterior Nasal Swab     Status: None   Collection Time: 05/18/23  4:31 PM   Specimen: Anterior Nasal Swab  Result Value Ref Range   SARS Coronavirus 2 by RT PCR NEGATIVE NEGATIVE    Comment: (NOTE) SARS-CoV-2 target nucleic acids are NOT DETECTED.  The SARS-CoV-2 RNA is generally detectable in upper and lower respiratory specimens during the acute phase of infection. The lowest concentration of SARS-CoV-2 viral copies this assay can detect is 250 copies / mL. A negative result does not preclude SARS-CoV-2 infection and should not be used as the sole basis for treatment or other patient management decisions.  A negative result  may occur with improper specimen collection / handling, submission of specimen other than nasopharyngeal swab, presence of viral mutation(s) within the areas targeted by this  assay, and inadequate number of viral copies (<250 copies / mL). A negative result must be combined with clinical observations, patient history, and epidemiological information.  Fact Sheet for Patients:   RoadLapTop.co.za  Fact Sheet for Healthcare Providers: http://kim-miller.com/  This test is not yet approved or  cleared by the [place] FDA and has been authorized for detection and/or diagnosis of SARS-CoV-2 by FDA under an Emergency Use Authorization (EUA).  This EUA will remain in effect (meaning this test can be used) for the duration of the COVID-19 declaration under Section 564(b)(1) of the Act, 21 U.S.C. section 360bbb-3(b)(1), unless the authorization is terminated or revoked sooner.  Performed at [facility], [address] [place], [place] [address]   Blood gas, arterial     Status: Abnormal   Collection Time: 05/18/23  5:30 PM  Result Value Ref Range   FIO2 50 %   Delivery systems VENTILATOR    MECHVT 550 mL   PEEP 8 cm H20   pH, Arterial 7.32 (L) 7.35 - 7.45   pCO2 arterial 33 32 - 48 mmHg   pO2, Arterial 130 (H) 83 - 108 mmHg   Bicarbonate 17.0 (L) 20.0 - 28.0 mmol/L   Acid-base deficit 8.1 (H) 0.0 - 2.0 mmol/L   O2 Saturation 99.4 %   Patient temperature 37.0    Collection site RIGHT RADIAL    Allens test (pass/fail) PASS PASS   Mechanical Rate 15     Comment: Performed at [facility], [address] [place], [place] [address]  Urinalysis, Routine w reflex microscopic -Urine, Catheterized     Status: Abnormal   Collection Time: 05/18/23  5:37 PM  Result Value Ref Range   Color, Urine YELLOW (A) YELLOW   APPearance CLOUDY (A) CLEAR   Specific Gravity, Urine 1.019 1.005 - 1.030   pH 5.0 5.0 - 8.0   Glucose, UA  NEGATIVE NEGATIVE mg/dL   Hgb urine dipstick NEGATIVE NEGATIVE   Bilirubin Urine NEGATIVE NEGATIVE   Ketones, ur NEGATIVE NEGATIVE mg/dL   Protein, ur 956 (A) NEGATIVE mg/dL   Nitrite NEGATIVE NEGATIVE   Leukocytes,Ua LARGE (A) NEGATIVE   RBC / HPF 0-5 0 - 5 RBC/hpf   WBC, UA >50 0 - 5 WBC/hpf   Bacteria, UA NONE SEEN NONE SEEN   Squamous Epithelial / HPF 0-5 0 - 5 /HPF   WBC Clumps PRESENT    Mucus PRESENT    Hyaline Casts, UA PRESENT     Comment: Performed at [facility], [address] [place], [place] [address]  Urine Drug Screen, Qualitative     Status: Abnormal   Collection Time: 05/18/23  5:37 PM  Result Value Ref Range   Tricyclic, Ur Screen NONE DETECTED NONE DETECTED   Amphetamines, Ur Screen NONE DETECTED NONE DETECTED   MDMA (Ecstasy)Ur Screen NONE DETECTED NONE DETECTED   Cocaine Metabolite,Ur [place] NONE DETECTED NONE DETECTED   Opiate, Ur Screen NONE DETECTED NONE DETECTED   Phencyclidine (PCP) Ur S NONE DETECTED NONE DETECTED   Cannabinoid 50 Ng, Ur [place] NONE DETECTED NONE DETECTED   Barbiturates, Ur Screen NONE DETECTED NONE DETECTED   Benzodiazepine, Ur Scrn POSITIVE (A) NONE DETECTED   Methadone Scn, Ur NONE DETECTED NONE DETECTED    Comment: (NOTE) Tricyclics + metabolites, urine    Cutoff 1000 ng/mL Amphetamines + metabolites, urine  Cutoff 1000 ng/mL MDMA (Ecstasy), urine              Cutoff 500 ng/mL Cocaine  Metabolite, urine          Cutoff 300 ng/mL Opiate + metabolites, urine        Cutoff 300 ng/mL Phencyclidine (PCP), urine         Cutoff 25 ng/mL Cannabinoid, urine                 Cutoff 50 ng/mL Barbiturates + metabolites, urine  Cutoff 200 ng/mL Benzodiazepine, urine              Cutoff 200 ng/mL Methadone, urine                   Cutoff 300 ng/mL  The urine drug screen provides only a preliminary, unconfirmed analytical test result and should not be used for non-medical purposes. Clinical consideration and professional judgment  should be applied to any positive drug screen result due to possible interfering substances. A more specific alternate chemical method must be used in order to obtain a confirmed analytical result. Gas chromatography / mass spectrometry (GC/MS) is the preferred confirm atory method. Performed at [facility], [address] [place], [place] [address]   Lactic acid, plasma     Status: None   Collection Time: 05/18/23  6:20 PM  Result Value Ref Range   Lactic Acid, Venous 1.9 0.5 - 1.9 mmol/L    Comment: Performed at [facility], [address] [place], [place] [address]  Troponin I (High Sensitivity)     Status: Abnormal   Collection Time: 05/18/23  6:20 PM  Result Value Ref Range   Troponin I (High Sensitivity) 40 (H) <18 ng/L    Comment: RESULT CALLED TO, READ BACK BY AND VERIFIED WITH MYRA FLOWERS @1855  05/18/23 MJU (NOTE) Elevated high sensitivity troponin I (hsTnI) values and significant  changes across serial measurements may suggest ACS but many other  chronic and acute conditions are known to elevate hsTnI results.  Refer to the "Links" section for chest pain algorithms and additional  guidance. Performed at [facility], [address] [place], [place] [address]   Glucose, capillary     Status: Abnormal   Collection Time: 05/18/23  6:44 PM  Result Value Ref Range   Glucose-Capillary 122 (H) 70 - 99 mg/dL    Comment: Glucose reference range applies only to samples taken after fasting for at least 8 hours.  Prepare fresh frozen plasma     Status: None   Collection Time: 05/18/23  7:13 PM  Result Value Ref Range   Unit Number [id number]    Blood Component Type THW PLS APHR    Unit division 00    Status of Unit REL FROM [facility]    Transfusion Status OK TO TRANSFUSE   BPAM FFP     Status: None   Collection Time: 05/18/23  7:13 PM  Result Value Ref Range   Blood Product Unit Number [id number]    PRODUCT CODE [id number]    Unit Type and  Rh 5100    Blood Product Expiration Date [id number]   Basic metabolic panel     Status: Abnormal   Collection Time: 05/18/23  8:53 PM  Result Value Ref Range   Sodium 136 135 - 145 mmol/L   Potassium 5.1 3.5 - 5.1 mmol/L   Chloride 111 98 - 111 mmol/L   CO2 18 (L) 22 - 32 mmol/L   Glucose, Bld 143 (H) 70 - 99 mg/dL    Comment: Glucose reference range applies only to samples taken after fasting  for at least 8 hours.   BUN 36 (H) 8 - 23 mg/dL   Creatinine, Ser 8.41 (H) 0.61 - 1.24 mg/dL   Calcium 8.3 (L) 8.9 - 10.3 mg/dL   GFR, Estimated 43 (L) >60 mL/min    Comment: (NOTE) Calculated using the CKD-EPI Creatinine Equation (2021)    Anion gap 7 5 - 15    Comment: Performed at [facility], [address] [place], [place] [address]  Blood gas, venous     Status: Abnormal   Collection Time: 05/18/23  8:53 PM  Result Value Ref Range   FIO2 40 %   MECHVT 550 mL   PEEP 8 cm H20   pH, Ven 7.29 7.25 - 7.43   pCO2, Ven 40 (L) 44 - 60 mmHg   pO2, Ven 65 (H) 32 - 45 mmHg   Bicarbonate 19.2 (L) 20.0 - 28.0 mmol/L   Acid-base deficit 7.0 (H) 0.0 - 2.0 mmol/L   O2 Saturation 93 %   Patient temperature 37.0    Collection site VEIN    Mechanical Rate 15     Comment: Performed at [facility], [address] [place], [place] [address]  Protime-INR     Status: Abnormal   Collection Time: 05/18/23  8:53 PM  Result Value Ref Range   Prothrombin Time 16.5 (H) 11.4 - 15.2 seconds   INR 1.3 (H) 0.8 - 1.2    Comment: (NOTE) INR goal varies based on device and disease states. Performed at [facility], [address] [place], [place] [address]   Type and screen     Status: None   Collection Time: 05/18/23 10:44 PM  Result Value Ref Range   ABO/RH(D) O NEG    Antibody Screen NEG    Sample Expiration      05/21/2023,2359 Performed at [facility], [address] [place], [place] [address]   Troponin I (High Sensitivity)     Status: Abnormal    Collection Time: 05/18/23 10:47 PM  Result Value Ref Range   Troponin I (High Sensitivity) 101 (HH) <18 ng/L    Comment: CRITICAL RESULT CALLED TO, READ BACK BY AND VERIFIED WITH IVAN SCRIVNER @2325  ON 05/18/23 SKL (NOTE) Elevated high sensitivity troponin I (hsTnI) values and significant  changes across serial measurements may suggest ACS but many other  chronic and acute conditions are known to elevate hsTnI results.  Refer to the "Links" section for chest pain algorithms and additional  guidance. Performed at [facility], [address] [place], [place] [address]   Magnesium     Status: None   Collection Time: 05/18/23 10:47 PM  Result Value Ref Range   Magnesium 2.4 1.7 - 2.4 mg/dL    Comment: Performed at [facility], [address] [place], [place] [address]  Phosphorus     Status: None   Collection Time: 05/18/23 10:47 PM  Result Value Ref Range   Phosphorus 4.1 2.5 - 4.6 mg/dL    Comment: Performed at [facility], [address] [place], [place] [address]  Glucose, capillary     Status: Abnormal   Collection Time: 05/18/23 11:07 PM  Result Value Ref Range   Glucose-Capillary 130 (H) 70 - 99 mg/dL    Comment: Glucose reference range applies only to samples taken after fasting for at least 8 hours.  Troponin I (High Sensitivity)     Status: Abnormal   Collection Time: 05/19/23  2:11 AM  Result Value Ref  Range   Troponin I (High Sensitivity) 116 (HH) <18 ng/L    Comment: CRITICAL VALUE NOTED. VALUE IS CONSISTENT WITH PREVIOUSLY REPORTED/CALLED VALUE SKL (NOTE) Elevated high sensitivity troponin I (hsTnI) values and significant  changes across serial measurements may suggest ACS but many other  chronic and acute conditions are known to elevate hsTnI results.  Refer to the "Links" section for chest pain algorithms and additional  guidance. Performed at [facility], [address] [place], [place] [address]   Glucose,  capillary     Status: None   Collection Time: 05/19/23  3:32 AM  Result Value Ref Range   Glucose-Capillary 96 70 - 99 mg/dL    Comment: Glucose reference range applies only to samples taken after fasting for at least 8 hours.  Triglycerides     Status: None   Collection Time: 05/19/23  4:44 AM  Result Value Ref Range   Triglycerides 120 <150 mg/dL    Comment: Performed at [facility], [address] [place], [place] [address]  Magnesium     Status: None   Collection Time: 05/19/23  4:44 AM  Result Value Ref Range   Magnesium 2.4 1.7 - 2.4 mg/dL    Comment: Performed at [facility], [address] [place], [place] [address]  Phosphorus     Status: None   Collection Time: 05/19/23  4:44 AM  Result Value Ref Range   Phosphorus 4.4 2.5 - 4.6 mg/dL    Comment: Performed at [facility], [address] [place], [place] [address]  Basic metabolic panel     Status: Abnormal   Collection Time: 05/19/23  4:44 AM  Result Value Ref Range   Sodium 141 135 - 145 mmol/L   Potassium 4.7 3.5 - 5.1 mmol/L   Chloride 113 (H) 98 - 111 mmol/L   CO2 22 22 - 32 mmol/L   Glucose, Bld 101 (H) 70 - 99 mg/dL    Comment: Glucose reference range applies only to samples taken after fasting for at least 8 hours.   BUN 34 (H) 8 - 23 mg/dL   Creatinine, Ser 6.57 (H) 0.61 - 1.24 mg/dL   Calcium 8.5 (L) 8.9 - 10.3 mg/dL   GFR, Estimated 44 (L) >60 mL/min    Comment: (NOTE) Calculated using the CKD-EPI Creatinine Equation (2021)    Anion gap 6 5 - 15    Comment: Performed at [facility], [address] [place], [place] [address]  CBC     Status: Abnormal   Collection Time: 05/19/23  4:44 AM  Result Value Ref Range   WBC 14.6 (H) 4.0 - 10.5 K/uL   RBC 4.39 4.22 - 5.81 MIL/uL   Hemoglobin 13.2 13.0 - 17.0 g/dL   HCT 29.5 28.4 - 13.2 %   MCV 94.3 80.0 - 100.0 fL   MCH 30.1 26.0 - 34.0 pg   MCHC 31.9 30.0 - 36.0 g/dL   RDW 44.0 10.2 - 72.5 %   Platelets 179  150 - 400 K/uL   nRBC 0.0 0.0 - 0.2 %    Comment: Performed at [facility], [address] [place], [place] [address]  Glucose, capillary     Status: None   Collection Time: 05/19/23  7:27 AM  Result Value Ref Range   Glucose-Capillary 87 70 - 99 mg/dL    Comment: Glucose reference range applies only to samples taken after fasting for at least 8 hours.  ECHOCARDIOGRAM COMPLETE  Status: None   Collection Time: 05/19/23  8:05 AM  Result Value Ref Range   Weight 3,333.36 oz   Height 72 in   BP 82/70 mmHg   Ao pk vel 0.87 m/s   AV Area VTI 3.92 cm2   AR max vel 3.22 cm2   AV Mean grad 1.5 mmHg   AV Peak grad 3.0 mmHg   Single Plane A2C EF 49.8 %   Single Plane A4C EF 50.9 %   Calc EF 51.8 %   S' Lateral 4.40 cm   AV Area mean vel 3.10 cm2   Area-P 1/2 6.83 cm2   Est EF 40 - 45%   Glucose, capillary     Status: Abnormal   Collection Time: 05/19/23 11:24 AM  Result Value Ref Range   Glucose-Capillary 136 (H) 70 - 99 mg/dL    Comment: Glucose reference range applies only to samples taken after fasting for at least 8 hours.  Glucose, capillary     Status: Abnormal   Collection Time: 05/19/23  3:50 PM  Result Value Ref Range   Glucose-Capillary 129 (H) 70 - 99 mg/dL    Comment: Glucose reference range applies only to samples taken after fasting for at least 8 hours.  APTT     Status: Abnormal   Collection Time: 05/19/23  5:41 PM  Result Value Ref Range   aPTT 140 (H) 24 - 36 seconds    Comment:        IF BASELINE aPTT IS ELEVATED, SUGGEST PATIENT RISK ASSESSMENT BE USED TO DETERMINE APPROPRIATE ANTICOAGULANT THERAPY. Performed at [facility] [address] [place], [place] [address]   MRSA Next Gen by PCR, Nasal     Status: None   Collection Time: 05/19/23  5:53 PM   Specimen: Nasal Mucosa; Nasal Swab  Result Value Ref Range   MRSA by PCR Next Gen NOT DETECTED NOT DETECTED    Comment: (NOTE) The GeneXpert MRSA Assay (FDA approved for NASAL  specimens only), is one component of a comprehensive MRSA colonization surveillance program. It is not intended to diagnose MRSA infection nor to guide or monitor treatment for MRSA infections. Test performance is not FDA approved in patients less than 34 years old. Performed at [facility], [address] [place], [place] [address]   Procalcitonin     Status: None   Collection Time: 05/19/23  5:59 PM  Result Value Ref Range   Procalcitonin <0.10 ng/mL    Comment:        Interpretation: PCT (Procalcitonin) <= 0.5 ng/mL: Systemic infection (sepsis) is not likely. Local bacterial infection is possible. (NOTE)       Sepsis PCT Algorithm           Lower Respiratory Tract                                      Infection PCT Algorithm    ----------------------------     ----------------------------         PCT < 0.25 ng/mL                PCT < 0.10 ng/mL          Strongly encourage             Strongly discourage   discontinuation of antibiotics    initiation of antibiotics    ----------------------------     -----------------------------  PCT 0.25 - 0.50 ng/mL            PCT 0.10 - 0.25 ng/mL               OR       >80% decrease in PCT            Discourage initiation of                                            antibiotics      Encourage discontinuation           of antibiotics    ----------------------------     -----------------------------         PCT >= 0.50 ng/mL              PCT 0.26 - 0.50 ng/mL               AND        <80% decrease in PCT             Encourage initiation of                                             antibiotics       Encourage continuation           of antibiotics    ----------------------------     -----------------------------        PCT >= 0.50 ng/mL                  PCT > 0.50 ng/mL               AND         increase in PCT                  Strongly encourage                                      initiation of antibiotics     Strongly encourage escalation           of antibiotics                                     -----------------------------                                           PCT <= 0.25 ng/mL                                                 OR                                        > 80% decrease in PCT  Discontinue / Do not initiate                                             antibiotics  Performed at [facility], [address] [place], [place] [address]   Glucose, capillary     Status: Abnormal   Collection Time: 05/19/23  8:00 PM  Result Value Ref Range   Glucose-Capillary 122 (H) 70 - 99 mg/dL    Comment: Glucose reference range applies only to samples taken after fasting for at least 8 hours.  Glucose, capillary     Status: Abnormal   Collection Time: 05/19/23 11:29 PM  Result Value Ref Range   Glucose-Capillary 127 (H) 70 - 99 mg/dL    Comment: Glucose reference range applies only to samples taken after fasting for at least 8 hours.  Glucose, capillary     Status: Abnormal   Collection Time: 05/20/23  3:02 AM  Result Value Ref Range   Glucose-Capillary 108 (H) 70 - 99 mg/dL    Comment: Glucose reference range applies only to samples taken after fasting for at least 8 hours.  CBC     Status: Abnormal   Collection Time: 05/20/23  5:35 AM  Result Value Ref Range   WBC 13.1 (H) 4.0 - 10.5 K/uL   RBC 4.09 (L) 4.22 - 5.81 MIL/uL   Hemoglobin 12.3 (L) 13.0 - 17.0 g/dL   HCT 19.1 (L) 47.8 - 29.5 %   MCV 93.6 80.0 - 100.0 fL   MCH 30.1 26.0 - 34.0 pg   MCHC 32.1 30.0 - 36.0 g/dL   RDW 62.1 30.8 - 65.7 %   Platelets 113 (L) 150 - 400 K/uL    Comment: Immature Platelet Fraction may be clinically indicated, consider ordering this additional test [id number]    nRBC 0.0 0.0 - 0.2 %    Comment: Performed at [facility], [address] [place], [place] [address]  Basic metabolic panel     Status: Abnormal   Collection Time: 05/20/23   5:35 AM  Result Value Ref Range   Sodium 137 135 - 145 mmol/L   Potassium 3.5 3.5 - 5.1 mmol/L   Chloride 105 98 - 111 mmol/L   CO2 23 22 - 32 mmol/L   Glucose, Bld 108 (H) 70 - 99 mg/dL    Comment: Glucose reference range applies only to samples taken after fasting for at least 8 hours.   BUN 26 (H) 8 - 23 mg/dL   Creatinine, Ser 2.44 (H) 0.61 - 1.24 mg/dL   Calcium 8.4 (L) 8.9 - 10.3 mg/dL   GFR, Estimated 47 (L) >60 mL/min    Comment: (NOTE) Calculated using the CKD-EPI Creatinine Equation (2021)    Anion gap 9 5 - 15    Comment: Performed at [facility], [address] [place], [place] [address]  Magnesium     Status: None   Collection Time: 05/20/23  5:35 AM  Result Value Ref Range   Magnesium 2.0 1.7 - 2.4 mg/dL    Comment: Performed at [facility], [address] [place], [place] [address]  Hemoglobin A1c     Status: Abnormal   Collection Time: 05/20/23  5:35 AM  Result Value Ref Range   Hgb A1c MFr Bld 5.7 (H) 4.8 - 5.6 %    Comment: (NOTE) Pre diabetes:  5.7%-6.4%  Diabetes:              >6.4%  Glycemic control for   <7.0% adults with diabetes    Mean Plasma Glucose 116.89 mg/dL    Comment: Performed at [facility], [address], [place], [place] [address]  Glucose, capillary     Status: Abnormal   Collection Time: 05/20/23  7:41 AM  Result Value Ref Range   Glucose-Capillary 123 (H) 70 - 99 mg/dL    Comment: Glucose reference range applies only to samples taken after fasting for at least 8 hours.  Glucose, capillary     Status: Abnormal   Collection Time: 05/20/23 11:29 AM  Result Value Ref Range   Glucose-Capillary 129 (H) 70 - 99 mg/dL    Comment: Glucose reference range applies only to samples taken after fasting for at least 8 hours.  Basic metabolic panel     Status: Abnormal   Collection Time: 05/20/23  3:09 PM  Result Value Ref Range   Sodium 135 135 - 145 mmol/L   Potassium 3.4 (L) 3.5 - 5.1 mmol/L    Chloride 104 98 - 111 mmol/L   CO2 24 22 - 32 mmol/L   Glucose, Bld 136 (H) 70 - 99 mg/dL    Comment: Glucose reference range applies only to samples taken after fasting for at least 8 hours.   BUN 24 (H) 8 - 23 mg/dL   Creatinine, Ser 1.19 (H) 0.61 - 1.24 mg/dL   Calcium 8.0 (L) 8.9 - 10.3 mg/dL   GFR, Estimated 50 (L) >60 mL/min    Comment: (NOTE) Calculated using the CKD-EPI Creatinine Equation (2021)    Anion gap 7 5 - 15    Comment: Performed at [facility], [address] [place], [place] [address]  Magnesium     Status: None   Collection Time: 05/20/23  3:09 PM  Result Value Ref Range   Magnesium 2.0 1.7 - 2.4 mg/dL    Comment: Performed at [facility], [address] [place], [place] [address]  Glucose, capillary     Status: Abnormal   Collection Time: 05/20/23  4:03 PM  Result Value Ref Range   Glucose-Capillary 122 (H) 70 - 99 mg/dL    Comment: Glucose reference range applies only to samples taken after fasting for at least 8 hours.  Heparin level (unfractionated)     Status: None   Collection Time: 05/20/23  6:48 PM  Result Value Ref Range   Heparin Unfractionated 0.67 0.30 - 0.70 IU/mL    Comment: (NOTE) The clinical reportable range upper limit is being lowered to >1.10 to align with the FDA approved guidance for the current laboratory assay.  If heparin results are below expected values, and patient dosage has  been confirmed, suggest follow up testing of antithrombin III levels. Performed at [facility], [address] [place], [place] [address]   APTT     Status: Abnormal   Collection Time: 05/20/23  6:48 PM  Result Value Ref Range   aPTT 70 (H) 24 - 36 seconds    Comment:        IF BASELINE aPTT IS ELEVATED, SUGGEST PATIENT RISK ASSESSMENT BE USED TO DETERMINE APPROPRIATE ANTICOAGULANT THERAPY. Performed at [facility], [address] [place], [place] [address]   Glucose, capillary     Status: Abnormal    Collection Time: 05/20/23  9:22 PM  Result Value Ref Range   Glucose-Capillary 125 (H) 70 -  99 mg/dL    Comment: Glucose reference range applies only to samples taken after fasting for at least 8 hours.  CBC     Status: Abnormal   Collection Time: 05/21/23  3:40 AM  Result Value Ref Range   WBC 10.5 4.0 - 10.5 K/uL   RBC 3.96 (L) 4.22 - 5.81 MIL/uL   Hemoglobin 11.9 (L) 13.0 - 17.0 g/dL   HCT 63.0 (L) 16.0 - 10.9 %   MCV 93.4 80.0 - 100.0 fL   MCH 30.1 26.0 - 34.0 pg   MCHC 32.2 30.0 - 36.0 g/dL   RDW 32.3 55.7 - 32.2 %   Platelets 114 (L) 150 - 400 K/uL   nRBC 0.0 0.0 - 0.2 %    Comment: Performed at [facility], [address] [place], [place] [address]  APTT     Status: Abnormal   Collection Time: 05/21/23  3:40 AM  Result Value Ref Range   aPTT 64 (H) 24 - 36 seconds    Comment:        IF BASELINE aPTT IS ELEVATED, SUGGEST PATIENT RISK ASSESSMENT BE USED TO DETERMINE APPROPRIATE ANTICOAGULANT THERAPY. Performed at [facility], [address] [place], [place] [address]   Heparin level (unfractionated)     Status: None   Collection Time: 05/21/23  3:40 AM  Result Value Ref Range   Heparin Unfractionated 0.53 0.30 - 0.70 IU/mL    Comment: (NOTE) The clinical reportable range upper limit is being lowered to >1.10 to align with the FDA approved guidance for the current laboratory assay.  If heparin results are below expected values, and patient dosage has  been confirmed, suggest follow up testing of antithrombin III levels. Performed at [facility], [address] [place], [place] [address]   Basic metabolic panel     Status: Abnormal   Collection Time: 05/21/23  3:40 AM  Result Value Ref Range   Sodium 136 135 - 145 mmol/L   Potassium 3.7 3.5 - 5.1 mmol/L   Chloride 104 98 - 111 mmol/L   CO2 25 22 - 32 mmol/L   Glucose, Bld 109 (H) 70 - 99 mg/dL    Comment: Glucose reference range applies only to samples taken after fasting for  at least 8 hours.   BUN 22 8 - 23 mg/dL   Creatinine, Ser 1.51 (H) 0.61 - 1.24 mg/dL   Calcium 8.3 (L) 8.9 - 10.3 mg/dL   GFR, Estimated 54 (L) >60 mL/min    Comment: (NOTE) Calculated using the CKD-EPI Creatinine Equation (2021)    Anion gap 7 5 - 15    Comment: Performed at [facility], [address], [place], [place] [address]  Magnesium     Status: None   Collection Time: 05/21/23  3:40 AM  Result Value Ref Range   Magnesium 2.1 1.7 - 2.4 mg/dL    Comment: Performed at [facility], [address] [place], [place] [address]  Glucose, capillary     Status: None   Collection Time: 05/21/23  7:44 AM  Result Value Ref Range   Glucose-Capillary 98 70 - 99 mg/dL    Comment: Glucose reference range applies only to samples taken after fasting for at least 8 hours.  Glucose, capillary     Status: Abnormal   Collection Time: 05/21/23 11:25 AM  Result Value Ref Range   Glucose-Capillary 57 (L) 70 - 99 mg/dL    Comment: Glucose reference range applies only to samples  taken after fasting for at least 8 hours.  Glucose, capillary     Status: Abnormal   Collection Time: 05/21/23 11:26 AM  Result Value Ref Range   Glucose-Capillary 126 (H) 70 - 99 mg/dL    Comment: Glucose reference range applies only to samples taken after fasting for at least 8 hours.  Glucose, capillary     Status: Abnormal   Collection Time: 05/21/23  4:34 PM  Result Value Ref Range   Glucose-Capillary 68 (L) 70 - 99 mg/dL    Comment: Glucose reference range applies only to samples taken after fasting for at least 8 hours.  Glucose, capillary     Status: Abnormal   Collection Time: 05/21/23  4:36 PM  Result Value Ref Range   Glucose-Capillary 111 (H) 70 - 99 mg/dL    Comment: Glucose reference range applies only to samples taken after fasting for at least 8 hours.  Glucose, capillary     Status: Abnormal   Collection Time: 05/21/23 10:16 PM  Result Value Ref Range   Glucose-Capillary 111 (H)  70 - 99 mg/dL    Comment: Glucose reference range applies only to samples taken after fasting for at least 8 hours.  Heparin level (unfractionated)     Status: None   Collection Time: 05/22/23  4:55 AM  Result Value Ref Range   Heparin Unfractionated 0.39 0.30 - 0.70 IU/mL    Comment: (NOTE) The clinical reportable range upper limit is being lowered to >1.10 to align with the FDA approved guidance for the current laboratory assay.  If heparin results are below expected values, and patient dosage has  been confirmed, suggest follow up testing of antithrombin III levels. Performed at [facility], [address] [place], [place] [address]   CBC     Status: Abnormal   Collection Time: 05/22/23  4:55 AM  Result Value Ref Range   WBC 10.4 4.0 - 10.5 K/uL   RBC 3.98 (L) 4.22 - 5.81 MIL/uL   Hemoglobin 12.0 (L) 13.0 - 17.0 g/dL   HCT 60.4 (L) 54.0 - 98.1 %   MCV 92.0 80.0 - 100.0 fL   MCH 30.2 26.0 - 34.0 pg   MCHC 32.8 30.0 - 36.0 g/dL   RDW 19.1 47.8 - 29.5 %   Platelets 126 (L) 150 - 400 K/uL   nRBC 0.0 0.0 - 0.2 %    Comment: Performed at [facility], [address] [place], [place] [address]  Glucose, capillary     Status: None   Collection Time: 05/22/23  8:51 AM  Result Value Ref Range   Glucose-Capillary 94 70 - 99 mg/dL    Comment: Glucose reference range applies only to samples taken after fasting for at least 8 hours.  Glucose, capillary     Status: Abnormal   Collection Time: 05/22/23 11:35 AM  Result Value Ref Range   Glucose-Capillary 114 (H) 70 - 99 mg/dL    Comment: Glucose reference range applies only to samples taken after fasting for at least 8 hours.  Basic metabolic panel     Status: Abnormal   Collection Time: 05/22/23 12:30 PM  Result Value Ref Range   Sodium 134 (L) 135 - 145 mmol/L   Potassium 3.4 (L) 3.5 - 5.1 mmol/L   Chloride 101 98 - 111 mmol/L   CO2 24 22 - 32 mmol/L   Glucose, Bld 112 (H) 70 - 99 mg/dL    Comment: Glucose  reference range applies only to samples  taken after fasting for at least 8 hours.   BUN 16 8 - 23 mg/dL   Creatinine, Ser 2.44 0.61 - 1.24 mg/dL   Calcium 8.5 (L) 8.9 - 10.3 mg/dL   GFR, Estimated >01 >02 mL/min    Comment: (NOTE) Calculated using the CKD-EPI Creatinine Equation (2021)    Anion gap 9 5 - 15    Comment: Performed at [facility], [address] [place], [place] [address]  Glucose, capillary     Status: Abnormal   Collection Time: 05/22/23  4:35 PM  Result Value Ref Range   Glucose-Capillary 109 (H) 70 - 99 mg/dL    Comment: Glucose reference range applies only to samples taken after fasting for at least 8 hours.  Glucose, capillary     Status: Abnormal   Collection Time: 05/22/23  9:11 PM  Result Value Ref Range   Glucose-Capillary 124 (H) 70 - 99 mg/dL    Comment: Glucose reference range applies only to samples taken after fasting for at least 8 hours.  Basic metabolic panel     Status: Abnormal   Collection Time: 05/23/23  3:41 AM  Result Value Ref Range   Sodium 136 135 - 145 mmol/L   Potassium 3.7 3.5 - 5.1 mmol/L   Chloride 105 98 - 111 mmol/L   CO2 24 22 - 32 mmol/L   Glucose, Bld 116 (H) 70 - 99 mg/dL    Comment: Glucose reference range applies only to samples taken after fasting for at least 8 hours.   BUN 19 8 - 23 mg/dL   Creatinine, Ser 6.44 0.61 - 1.24 mg/dL   Calcium 8.6 (L) 8.9 - 10.3 mg/dL   GFR, Estimated >03 >47 mL/min    Comment: (NOTE) Calculated using the CKD-EPI Creatinine Equation (2021)    Anion gap 7 5 - 15    Comment: Performed at [facility], [address] [place], [place] [address]  Magnesium     Status: None   Collection Time: 05/23/23  3:41 AM  Result Value Ref Range   Magnesium 2.0 1.7 - 2.4 mg/dL    Comment: Performed at [facility], [address] [place], [place] [address]  Glucose, capillary     Status: None   Collection Time: 05/23/23  7:22 AM  Result Value Ref Range    Glucose-Capillary 98 70 - 99 mg/dL    Comment: Glucose reference range applies only to samples taken after fasting for at least 8 hours.  Glucose, capillary     Status: Abnormal   Collection Time: 05/23/23 11:55 AM  Result Value Ref Range   Glucose-Capillary 105 (H) 70 - 99 mg/dL    Comment: Glucose reference range applies only to samples taken after fasting for at least 8 hours.  Glucose, capillary     Status: Abnormal   Collection Time: 05/23/23  5:20 PM  Result Value Ref Range   Glucose-Capillary 118 (H) 70 - 99 mg/dL    Comment: Glucose reference range applies only to samples taken after fasting for at least 8 hours.  Glucose, capillary     Status: Abnormal   Collection Time: 05/23/23  9:00 PM  Result Value Ref Range   Glucose-Capillary 108 (H) 70 - 99 mg/dL    Comment: Glucose reference range applies only to samples taken after fasting for at least 8 hours.  Basic metabolic panel     Status: Abnormal   Collection Time: 05/24/23  6:28 AM  Result Value Ref Range  Sodium 139 135 - 145 mmol/L   Potassium 3.7 3.5 - 5.1 mmol/L   Chloride 107 98 - 111 mmol/L   CO2 23 22 - 32 mmol/L   Glucose, Bld 117 (H) 70 - 99 mg/dL    Comment: Glucose reference range applies only to samples taken after fasting for at least 8 hours.   BUN 19 8 - 23 mg/dL   Creatinine, Ser 4.78 0.61 - 1.24 mg/dL   Calcium 8.7 (L) 8.9 - 10.3 mg/dL   GFR, Estimated >29 >56 mL/min    Comment: (NOTE) Calculated using the CKD-EPI Creatinine Equation (2021)    Anion gap 9 5 - 15    Comment: Performed at [facility], [address] [place], [place] [address]  Glucose, capillary     Status: None   Collection Time: 05/24/23  8:15 AM  Result Value Ref Range   Glucose-Capillary 96 70 - 99 mg/dL    Comment: Glucose reference range applies only to samples taken after fasting for at least 8 hours.  Glucose, capillary     Status: Abnormal   Collection Time: 05/24/23 11:49 AM  Result Value Ref Range    Glucose-Capillary 140 (H) 70 - 99 mg/dL    Comment: Glucose reference range applies only to samples taken after fasting for at least 8 hours.  Glucose, capillary     Status: None   Collection Time: 05/24/23  4:10 PM  Result Value Ref Range   Glucose-Capillary 90 70 - 99 mg/dL    Comment: Glucose reference range applies only to samples taken after fasting for at least 8 hours.  Glucose, capillary     Status: Abnormal   Collection Time: 05/24/23  9:44 PM  Result Value Ref Range   Glucose-Capillary 122 (H) 70 - 99 mg/dL    Comment: Glucose reference range applies only to samples taken after fasting for at least 8 hours.  Basic metabolic panel     Status: Abnormal   Collection Time: 05/25/23  5:04 AM  Result Value Ref Range   Sodium 139 135 - 145 mmol/L   Potassium 4.1 3.5 - 5.1 mmol/L   Chloride 105 98 - 111 mmol/L   CO2 26 22 - 32 mmol/L   Glucose, Bld 111 (H) 70 - 99 mg/dL    Comment: Glucose reference range applies only to samples taken after fasting for at least 8 hours.   BUN 18 8 - 23 mg/dL   Creatinine, Ser 6.57 0.61 - 1.24 mg/dL   Calcium 8.8 (L) 8.9 - 10.3 mg/dL   GFR, Estimated >84 >69 mL/min    Comment: (NOTE) Calculated using the CKD-EPI Creatinine Equation (2021)    Anion gap 8 5 - 15    Comment: Performed at [facility], [address] [place], [place] [address]  Glucose, capillary     Status: Abnormal   Collection Time: 05/25/23  7:54 AM  Result Value Ref Range   Glucose-Capillary 103 (H) 70 - 99 mg/dL    Comment: Glucose reference range applies only to samples taken after fasting for at least 8 hours.  Glucose, capillary     Status: Abnormal   Collection Time: 05/25/23  1:18 PM  Result Value Ref Range   Glucose-Capillary 117 (H) 70 - 99 mg/dL    Comment: Glucose reference range applies only to samples taken after fasting for at least 8 hours.  Glucose, capillary     Status: Abnormal   Collection Time: 05/25/23  5:13 PM  Result  Value Ref Range    Glucose-Capillary 109 (H) 70 - 99 mg/dL    Comment: Glucose reference range applies only to samples taken after fasting for at least 8 hours.  Glucose, capillary     Status: Abnormal   Collection Time: 05/25/23  9:18 PM  Result Value Ref Range   Glucose-Capillary 124 (H) 70 - 99 mg/dL    Comment: Glucose reference range applies only to samples taken after fasting for at least 8 hours.  Basic metabolic panel     Status: Abnormal   Collection Time: 05/26/23  3:16 AM  Result Value Ref Range   Sodium 137 135 - 145 mmol/L   Potassium 3.9 3.5 - 5.1 mmol/L   Chloride 104 98 - 111 mmol/L   CO2 26 22 - 32 mmol/L   Glucose, Bld 111 (H) 70 - 99 mg/dL    Comment: Glucose reference range applies only to samples taken after fasting for at least 8 hours.   BUN 18 8 - 23 mg/dL   Creatinine, Ser 4.09 (H) 0.61 - 1.24 mg/dL   Calcium 8.8 (L) 8.9 - 10.3 mg/dL   GFR, Estimated >81 >19 mL/min    Comment: (NOTE) Calculated using the CKD-EPI Creatinine Equation (2021)    Anion gap 7 5 - 15    Comment: Performed at [facility], [address] [place], [place] [address]  CBC     Status: Abnormal   Collection Time: 05/26/23  3:16 AM  Result Value Ref Range   WBC 10.6 (H) 4.0 - 10.5 K/uL   RBC 3.90 (L) 4.22 - 5.81 MIL/uL   Hemoglobin 11.8 (L) 13.0 - 17.0 g/dL   HCT 95.6 (L) 21.3 - 08.6 %   MCV 92.3 80.0 - 100.0 fL   MCH 30.3 26.0 - 34.0 pg   MCHC 32.8 30.0 - 36.0 g/dL   RDW 57.8 46.9 - 62.9 %   Platelets 182 150 - 400 K/uL   nRBC 0.0 0.0 - 0.2 %    Comment: Performed at [facility], [address], [place], [place] [address]  Surgical PCR screen     Status: Abnormal   Collection Time: 05/26/23  4:39 AM   Specimen: Nasal Mucosa; Nasal Swab  Result Value Ref Range   MRSA, PCR NEGATIVE NEGATIVE   Staphylococcus aureus POSITIVE (A) NEGATIVE    Comment: (NOTE) The Xpert SA Assay (FDA approved for NASAL specimens in patients 10 years of age and older), is one component of a  comprehensive surveillance program. It is not intended to diagnose infection nor to guide or monitor treatment. Performed at [facility], [address] [place], [place] [address]   Glucose, capillary     Status: Abnormal   Collection Time: 05/26/23  7:28 AM  Result Value Ref Range   Glucose-Capillary 109 (H) 70 - 99 mg/dL    Comment: Glucose reference range applies only to samples taken after fasting for at least 8 hours.  Glucose, capillary     Status: None   Collection Time: 05/26/23 12:06 PM  Result Value Ref Range   Glucose-Capillary 93 70 - 99 mg/dL    Comment: Glucose reference range applies only to samples taken after fasting for at least 8 hours.  Basic metabolic panel     Status: Abnormal   Collection Time: 05/27/23  2:41 AM  Result Value Ref Range   Sodium 137 135 - 145 mmol/L   Potassium 3.9 3.5 - 5.1 mmol/L  Chloride 105 98 - 111 mmol/L   CO2 23 22 - 32 mmol/L   Glucose, Bld 101 (H) 70 - 99 mg/dL    Comment: Glucose reference range applies only to samples taken after fasting for at least 8 hours.   BUN 18 8 - 23 mg/dL   Creatinine, Ser 2.84 0.61 - 1.24 mg/dL   Calcium 8.9 8.9 - 13.2 mg/dL   GFR, Estimated >44 >01 mL/min    Comment: (NOTE) Calculated using the CKD-EPI Creatinine Equation (2021)    Anion gap 9 5 - 15    Comment: Performed at [facility], [address] [place], [place] [address]  CBC     Status: Abnormal   Collection Time: 05/27/23  2:41 AM  Result Value Ref Range   WBC 11.2 (H) 4.0 - 10.5 K/uL   RBC 3.99 (L) 4.22 - 5.81 MIL/uL   Hemoglobin 12.1 (L) 13.0 - 17.0 g/dL   HCT 36.6 (L) 44.0 - 34.7 %   MCV 92.7 80.0 - 100.0 fL   MCH 30.3 26.0 - 34.0 pg   MCHC 32.7 30.0 - 36.0 g/dL   RDW 42.5 95.6 - 38.7 %   Platelets 213 150 - 400 K/uL   nRBC 0.0 0.0 - 0.2 %    Comment: Performed at [facility], [address], [place], [place] [address]  CBC with Differential/Platelet     Status: Abnormal   Collection Time: 06/12/23  1:44  PM  Result Value Ref Range   WBC 9.9 3.4 - 10.8 x10E3/uL   RBC 4.68 4.14 - 5.80 x10E6/uL   Hemoglobin 14.1 13.0 - 17.7 g/dL   Hematocrit 29.5 18.8 - 51.0 %   MCV 92 79 - 97 fL   MCH 30.1 26.6 - 33.0 pg   MCHC 32.7 31.5 - 35.7 g/dL   RDW 41.6 60.6 - 30.1 %   Platelets 217 150 - 450 x10E3/uL   Neutrophils 40 Not Estab. %   Lymphs 48 Not Estab. %   Monocytes 5 Not Estab. %   Eos 6 Not Estab. %   Basos 1 Not Estab. %   Neutrophils Absolute 4.0 1.4 - 7.0 x10E3/uL   Lymphocytes Absolute 4.8 (H) 0.7 - 3.1 x10E3/uL   Monocytes Absolute 0.5 0.1 - 0.9 x10E3/uL   EOS (ABSOLUTE) 0.6 (H) 0.0 - 0.4 x10E3/uL   Basophils Absolute 0.1 0.0 - 0.2 x10E3/uL   Immature Granulocytes 0 Not Estab. %   Immature Grans (Abs) 0.0 0.0 - 0.1 x10E3/uL  Comprehensive metabolic panel     Status: Abnormal   Collection Time: 06/12/23  1:44 PM  Result Value Ref Range   Glucose 93 70 - 99 mg/dL   BUN 29 (H) 8 - 27 mg/dL   Creatinine, Ser 6.01 (H) 0.76 - 1.27 mg/dL   eGFR 49 (L) >09 NA/TFT/7.32   BUN/Creatinine Ratio 19 10 - 24   Sodium 142 134 - 144 mmol/L   Potassium 4.4 3.5 - 5.2 mmol/L   Chloride 106 96 - 106 mmol/L   CO2 22 20 - 29 mmol/L   Calcium 9.4 8.6 - 10.2 mg/dL   Total Protein 6.6 6.0 - 8.5 g/dL   Albumin 4.1 3.8 - 4.8 g/dL   Globulin, Total 2.5 1.5 - 4.5 g/dL   Bilirubin Total 0.3 0.0 - 1.2 mg/dL   Alkaline Phosphatase 119 44 - 121 IU/L   AST 23 0 - 40 IU/L   ALT 13 0 - 44 IU/L  Lipid Panel w/o  Chol/HDL Ratio     Status: Abnormal   Collection Time: 06/12/23  1:44 PM  Result Value Ref Range   Cholesterol, Total 143 100 - 199 mg/dL   Triglycerides 562 (H) 0 - 149 mg/dL   HDL 49 >13 mg/dL   VLDL Cholesterol Cal 31 5 - 40 mg/dL   LDL Chol Calc (NIH) 63 0 - 99 mg/dL  T4, free     Status: None   Collection Time: 06/12/23  1:44 PM  Result Value Ref Range   Free T4 1.47 0.82 - 1.77 ng/dL  TSH     Status: Abnormal   Collection Time: 06/12/23  1:44 PM  Result Value Ref Range   TSH 8.090 (H)  0.450 - 4.500 uIU/mL    Radiology DG Chest 2 View  Result Date: 07/04/2023 CLINICAL DATA:  Status post pacemaker placement EXAM: CHEST - 2 VIEW COMPARISON:  Chest radiograph dated 05/20/2023 FINDINGS: Lines/tubes: Left chest wall ICD leads project over the right atrium and ventricle. Lungs: Lungs are clear without focal consolidation. Pleura: No pneumothorax or pleural effusion. Heart/mediastinum: The heart size and mediastinal contours are within normal limits. Bones: Median sternotomy wires are nondisplaced. Unchanged fracture of superior wires. IMPRESSION: Left chest wall ICD leads project over the right atrium and ventricle. No pneumothorax. Electronically Signed   By: Agustin Cree M.D.   On: 07/04/2023 08:30   EP PPM/ICD IMPLANT  Result Date: 07/03/2023  CONCLUSIONS:  1.  Chronic systolic heart failure, syncope and a history of sustained monomorphic ventricular tachycardia during electrophysiology study  2. Successful ICD implantation with a Medtronic dual-chamber ICD implanted for secondary prevention of sudden death.  3.  No early apparent complications.  4.  Hold Eliquis for 5 days (okay to restart July 09, 2023)    Assessment/Plan  AAA (abdominal aortic aneurysm) (HCC)  Duplex today shows mild dilatation of the right iliac system which is likely the stent graft limb up to 17 mm in diameter.  The aorta measures only about 3 cm in diameter.  The stent graft is patent without obvious endoleak. Overall doing well.  Continue annual surveillance with duplex.  No change in medications.  Benign hypertension with CKD (chronic kidney disease) stage III (HCC) blood pressure control important in reducing the progression of atherosclerotic disease and aneurysmal growth. On appropriate oral medications.   CKD (chronic kidney disease) stage 3, GFR 30-59 ml/min (HCC) Follow aneurysm repair with duplex to avoid contrast unless dramatic growth is seen.  Hyperlipidemia lipid control important in  reducing the progression of atherosclerotic disease. Continue statin therapy    Festus Barren, MD  07/18/2023 9:49 AM    This note was created with Dragon medical transcription system.  Any errors from dictation are purely unintentional

## 2023-07-18 NOTE — Assessment & Plan Note (Signed)
Duplex today shows mild dilatation of the right iliac system which is likely the stent graft limb up to 17 mm in diameter.  The aorta measures only about 3 cm in diameter.  The stent graft is patent without obvious endoleak. Overall doing well.  Continue annual surveillance with duplex.  No change in medications.

## 2023-07-18 NOTE — Assessment & Plan Note (Signed)
Follow aneurysm repair with duplex to avoid contrast unless dramatic growth is seen.

## 2023-07-18 NOTE — Assessment & Plan Note (Signed)
lipid control important in reducing the progression of atherosclerotic disease. Continue statin therapy  

## 2023-07-18 NOTE — Assessment & Plan Note (Signed)
blood pressure control important in reducing the progression of atherosclerotic disease and aneurysmal growth. On appropriate oral medications.  

## 2023-07-19 ENCOUNTER — Ambulatory Visit: Payer: Medicare HMO | Attending: Internal Medicine

## 2023-07-19 DIAGNOSIS — I472 Ventricular tachycardia, unspecified: Secondary | ICD-10-CM

## 2023-07-19 NOTE — Patient Instructions (Addendum)
   After Your ICD (Implantable Cardiac Defibrillator)    Monitor your defibrillator site for redness, swelling, and drainage. Call the device clinic at [phone] if you experience these symptoms or fever/chills.  Your incision was closed with Steri-strips or staples:  You may shower 7 days after your procedure and wash your incision with soap and water. Avoid lotions, ointments, or perfumes over your incision until it is well-healed.  You may use a hot tub or a pool after your wound check appointment if the incision is completely closed.  Do not lift, push or pull greater than 10 pounds with the affected arm until 6 weeks after your procedure. There are no other restrictions in arm movement after your wound check appointment.  Your ICD is MRI compatible.  Your ICD is designed to protect you from life threatening heart rhythms. Because of this, you may receive a shock.   1 shock with no symptoms:  Call the office during business hours. 1 shock with symptoms (chest pain, chest pressure, dizziness, lightheadedness, shortness of breath, overall feeling unwell):  Call 911. If you experience 2 or more shocks in 24 hours:  Call 911. If you receive a shock, you should not drive.  [place] DMV - no driving for 6 months starting on 05/18/23.  ICD Alerts:  Some alerts are vibratory and others beep. These are NOT emergencies. Please call our office to let us know. If this occurs at night or on weekends, it can wait until the next business day. Send a remote transmission.  If your device is capable of reading fluid status (for heart failure), you will be offered monthly monitoring to review this with you.   Remote monitoring is used to monitor your ICD from home. This monitoring is scheduled every 91 days by our office. It allows [place] to keep an eye on the functioning of your device to ensure it is working properly. You will routinely see your Electrophysiologist annually (more often if necessary).

## 2023-07-21 NOTE — Progress Notes (Signed)

## 2023-07-27 ENCOUNTER — Other Ambulatory Visit: Payer: Medicare HMO

## 2023-07-27 DIAGNOSIS — E039 Hypothyroidism, unspecified: Secondary | ICD-10-CM

## 2023-07-28 ENCOUNTER — Other Ambulatory Visit: Payer: Self-pay | Admitting: Nurse Practitioner

## 2023-07-28 DIAGNOSIS — E032 Hypothyroidism due to medicaments and other exogenous substances: Secondary | ICD-10-CM

## 2023-07-28 LAB — T4, FREE: Free T4: 1.41 ng/dL (ref 0.82–1.77)

## 2023-07-28 LAB — TSH: TSH: 9.55 u[IU]/mL — ABNORMAL HIGH (ref 0.450–4.500)

## 2023-07-28 MED ORDER — LEVOTHYROXINE SODIUM 125 MCG PO TABS: 125.0000 ug | ORAL_TABLET | Freq: Every day | ORAL | 3 refills | Status: DC

## 2023-07-28 NOTE — Progress Notes (Signed)
Called and spoke with pt's wife, she stated that she thought that the patient had not been taking the 100 MCG but she went ahead and scheduled the 6 week follow up for labs.

## 2023-07-28 NOTE — Progress Notes (Signed)
Contacted via MyChart - need lab only visit in 6 weeks please   Good morning Stuart Harvey, your labs have returned and TSH is still above goal.  I am going to increase your Levothyroxine to 125 MCG daily.  Please ensure to take first thing in morning, 30 minutes before eating and other medications.  We will recheck labs in 6 weeks.  Any questions? Keep being stellar!!  Thank you for allowing me to participate in your care.  I appreciate you. Kindest regards, Kimbella Heisler

## 2023-08-06 NOTE — Patient Instructions (Signed)
Be Involved in Caring For Your Health:  Taking Medications When medications are taken as directed, they can greatly improve your health. But if they are not taken as prescribed, they may not work. In some cases, not taking them correctly can be harmful. To help ensure your treatment remains effective and safe, understand your medications and how to take them. Bring your medications to each visit for review by your provider.  Your lab results, notes, and after visit summary will be available on My Chart. We strongly encourage you to use this feature. If lab results are abnormal the clinic will contact you with the appropriate steps. If the clinic does not contact you assume the results are satisfactory. You can always view your results on My Chart. If you have questions regarding your health or results, please contact the clinic during office hours. You can also ask questions on My Chart.  We at [facility] are grateful that you chose us to provide your care. We strive to provide evidence-based and compassionate care and are always looking for feedback. If you get a survey from the clinic please complete this so we can hear your opinions.  DASH Eating Plan DASH stands for Dietary Approaches to Stop Hypertension. The DASH eating plan is a healthy eating plan that has been shown to: Lower high blood pressure (hypertension). Reduce your risk for type 2 diabetes, heart disease, and stroke. Help with weight loss. What are tips for following this plan? Reading food labels Check food labels for the amount of salt (sodium) per serving. Choose foods with less than 5 percent of the Daily Value (DV) of sodium. In general, foods with less than 300 milligrams (mg) of sodium per serving fit into this eating plan. To find whole grains, look for the word "whole" as the first word in the ingredient list. Shopping Buy products labeled as "low-sodium" or "no salt added." Buy fresh foods. Avoid canned  foods and pre-made or frozen meals. Cooking Try not to add salt when you cook. Use salt-free seasonings or herbs instead of table salt or sea salt. Check with your health care provider or pharmacist before using salt substitutes. Do not fry foods. Cook foods in healthy ways, such as baking, boiling, grilling, roasting, or broiling. Cook using oils that are good for your heart. These include olive, canola, avocado, soybean, and sunflower oil. Meal planning  Eat a balanced diet. This should include: 4 or more servings of fruits and 4 or more servings of vegetables each day. Try to fill half of your plate with fruits and vegetables. 6-8 servings of whole grains each day. 6 or less servings of lean meat, poultry, or fish each day. 1 oz is 1 serving. A 3 oz (85 g) serving of meat is about the same size as the palm of your hand. One egg is 1 oz (28 g). 2-3 servings of low-fat dairy each day. One serving is 1 cup (237 mL). 1 serving of nuts, seeds, or beans 5 times each week. 2-3 servings of heart-healthy fats. Healthy fats called omega-3 fatty acids are found in foods such as walnuts, flaxseeds, fortified milks, and eggs. These fats are also found in cold-water fish, such as sardines, salmon, and mackerel. Limit how much you eat of: Canned or prepackaged foods. Food that is high in trans fat, such as fried foods. Food that is high in saturated fat, such as fatty meat. Desserts and other sweets, sugary drinks, and other foods with added sugar. Full-fat   dairy products. Do not salt foods before eating. Do not eat more than 4 egg yolks a week. Try to eat at least 2 vegetarian meals a week. Eat more home-cooked food and less restaurant, buffet, and fast food. Lifestyle When eating at a restaurant, ask if your food can be made with less salt or no salt. If you drink alcohol: Limit how much you have to: 0-1 drink a day if you are male. 0-2 drinks a day if you are male. Know how much alcohol is in  your drink. In the [place] one drink is one 12 oz bottle of beer (355 mL), one 5 oz glass of wine (148 mL), or one 1 oz glass of hard liquor (44 mL). General information Avoid eating more than 2,300 mg of salt a day. If you have hypertension, you may need to reduce your sodium intake to 1,500 mg a day. Work with your provider to stay at a healthy body weight or lose weight. Ask what the best weight range is for you. On most days of the week, get at least 30 minutes of exercise that causes your heart to beat faster. This may include walking, swimming, or biking. Work with your provider or dietitian to adjust your eating plan to meet your specific calorie needs. What foods should I eat? Fruits All fresh, dried, or frozen fruit. Canned fruits that are in their natural juice and do not have sugar added to them. Vegetables Fresh or frozen vegetables that are raw, steamed, roasted, or grilled. Low-sodium or reduced-sodium tomato and vegetable juice. Low-sodium or reduced-sodium tomato sauce and tomato paste. Low-sodium or reduced-sodium canned vegetables. Grains Whole-grain or whole-wheat bread. Whole-grain or whole-wheat pasta. Brown rice. Oatmeal. Quinoa. Bulgur. Whole-grain and low-sodium cereals. Pita bread. Low-fat, low-sodium crackers. Whole-wheat flour tortillas. Meats and other proteins Skinless chicken or turkey. Ground chicken or turkey. Pork with fat trimmed off. Fish and seafood. Egg whites. Dried beans, peas, or lentils. Unsalted nuts, nut butters, and seeds. Unsalted canned beans. Lean cuts of beef with fat trimmed off. Low-sodium, lean precooked or cured meat, such as sausages or meat loaves. Dairy Low-fat (1%) or fat-free (skim) milk. Reduced-fat, low-fat, or fat-free cheeses. Nonfat, low-sodium ricotta or cottage cheese. Low-fat or nonfat yogurt. Low-fat, low-sodium cheese. Fats and oils Soft margarine without trans fats. Vegetable oil. Reduced-fat, low-fat, or light mayonnaise and salad  dressings (reduced-sodium). Canola, safflower, olive, avocado, soybean, and sunflower oils. Avocado. Seasonings and condiments Herbs. Spices. Seasoning mixes without salt. Other foods Unsalted popcorn and pretzels. Fat-free sweets. The items listed above may not be all the foods and drinks you can have. Talk to a dietitian to learn more. What foods should I avoid? Fruits Canned fruit in a light or heavy syrup. Fried fruit. Fruit in cream or butter sauce. Vegetables Creamed or fried vegetables. Vegetables in a cheese sauce. Regular canned vegetables that are not marked as low-sodium or reduced-sodium. Regular canned tomato sauce and paste that are not marked as low-sodium or reduced-sodium. Regular tomato and vegetable juices that are not marked as low-sodium or reduced-sodium. Pickles. Olives. Grains Baked goods made with fat, such as croissants, muffins, or some breads. Dry pasta or rice meal packs. Meats and other proteins Fatty cuts of meat. Ribs. Fried meat. Bacon. Bologna, salami, and other precooked or cured meats, such as sausages or meat loaves, that are not lean and low in sodium. Fat from the back of a pig (fatback). Bratwurst. Salted nuts and seeds. Canned beans with added salt. Canned   or smoked fish. Whole eggs or egg yolks. Chicken or turkey with skin. Dairy Whole or 2% milk, cream, and half-and-half. Whole or full-fat cream cheese. Whole-fat or sweetened yogurt. Full-fat cheese. Nondairy creamers. Whipped toppings. Processed cheese and cheese spreads. Fats and oils Butter. Stick margarine. Lard. Shortening. Ghee. Bacon fat. Tropical oils, such as coconut, palm kernel, or palm oil. Seasonings and condiments Onion salt, garlic salt, seasoned salt, table salt, and sea salt. Worcestershire sauce. Tartar sauce. Barbecue sauce. Teriyaki sauce. Soy sauce, including reduced-sodium soy sauce. Steak sauce. Canned and packaged gravies. Fish sauce. Oyster sauce. Cocktail sauce. Store-bought  horseradish. Ketchup. Mustard. Meat flavorings and tenderizers. Bouillon cubes. Hot sauces. Pre-made or packaged marinades. Pre-made or packaged taco seasonings. Relishes. Regular salad dressings. Other foods Salted popcorn and pretzels. The items listed above may not be all the foods and drinks you should avoid. Talk to a dietitian to learn more. Where to find more information National Heart, Lung, and Blood Institute (NHLBI): nhlbi.nih.gov American Heart Association (AHA): heart.org Academy of Nutrition and Dietetics: eatright.org National Kidney Foundation (NKF): kidney.org This information is not intended to replace advice given to you by your health care provider. Make sure you discuss any questions you have with your health care provider. Document Revised: 11/17/2022 Document Reviewed: 11/17/2022 Elsevier Patient Education  2024 Elsevier Inc.  

## 2023-08-11 ENCOUNTER — Encounter: Payer: Self-pay | Admitting: Nurse Practitioner

## 2023-08-11 ENCOUNTER — Ambulatory Visit (INDEPENDENT_AMBULATORY_CARE_PROVIDER_SITE_OTHER): Payer: Medicare HMO | Admitting: Nurse Practitioner

## 2023-08-11 VITALS — BP 108/66 | HR 71 | Temp 98.2°F | Ht 70.0 in | Wt 205.0 lb

## 2023-08-11 DIAGNOSIS — E782 Mixed hyperlipidemia: Secondary | ICD-10-CM

## 2023-08-11 DIAGNOSIS — I129 Hypertensive chronic kidney disease with stage 1 through stage 4 chronic kidney disease, or unspecified chronic kidney disease: Secondary | ICD-10-CM

## 2023-08-11 DIAGNOSIS — I472 Ventricular tachycardia, unspecified: Secondary | ICD-10-CM | POA: Diagnosis not present

## 2023-08-11 DIAGNOSIS — Z0000 Encounter for general adult medical examination without abnormal findings: Secondary | ICD-10-CM

## 2023-08-11 DIAGNOSIS — I7143 Infrarenal abdominal aortic aneurysm, without rupture: Secondary | ICD-10-CM

## 2023-08-11 DIAGNOSIS — Z95810 Presence of automatic (implantable) cardiac defibrillator: Secondary | ICD-10-CM

## 2023-08-11 DIAGNOSIS — F324 Major depressive disorder, single episode, in partial remission: Secondary | ICD-10-CM

## 2023-08-11 DIAGNOSIS — N1831 Chronic kidney disease, stage 3a: Secondary | ICD-10-CM

## 2023-08-11 DIAGNOSIS — N183 Chronic kidney disease, stage 3 unspecified: Secondary | ICD-10-CM

## 2023-08-11 DIAGNOSIS — F32A Depression, unspecified: Secondary | ICD-10-CM

## 2023-08-11 DIAGNOSIS — I714 Abdominal aortic aneurysm, without rupture, unspecified: Secondary | ICD-10-CM

## 2023-08-11 DIAGNOSIS — I5020 Unspecified systolic (congestive) heart failure: Secondary | ICD-10-CM

## 2023-08-11 DIAGNOSIS — Z23 Encounter for immunization: Secondary | ICD-10-CM

## 2023-08-11 DIAGNOSIS — E039 Hypothyroidism, unspecified: Secondary | ICD-10-CM

## 2023-08-11 DIAGNOSIS — I480 Paroxysmal atrial fibrillation: Secondary | ICD-10-CM

## 2023-08-11 DIAGNOSIS — E785 Hyperlipidemia, unspecified: Secondary | ICD-10-CM

## 2023-08-11 MED ORDER — EZETIMIBE 10 MG PO TABS: 10.0000 mg | ORAL_TABLET | Freq: Every day | ORAL | 4 refills | Status: DC

## 2023-08-11 MED ORDER — ROSUVASTATIN CALCIUM 40 MG PO TABS: 40.0000 mg | ORAL_TABLET | Freq: Every day | ORAL | 4 refills | Status: DC

## 2023-08-11 NOTE — Assessment & Plan Note (Signed)
Chronic, stable.  Continue Celexa 20 MG due to age >31 and risks with 40 MG dosing and continue Wellbutrin.  Denies SI/HI.  Monitor Wellbutrin and Toprol XL use + Celexa and Omeprazole -- discussed may need to consider mood medication adjustments in future.

## 2023-08-11 NOTE — Assessment & Plan Note (Signed)
Placed 07/03/23, tolerating well.  Continue collaboration with cardiology.

## 2023-08-11 NOTE — Assessment & Plan Note (Signed)
Stable, has ICD placed on 07/03/23.  Continue collaboration with cardiology.  Recent notes reviewed.

## 2023-08-11 NOTE — Assessment & Plan Note (Signed)
Chronic, ongoing CKD 3a. Followed by nephrology.  Continue current medication regimen and adjust as needed.  Labs: up to date.

## 2023-08-11 NOTE — Assessment & Plan Note (Signed)
Chronic, ongoing.  Euvolemic on exam today. Recent EF stable at 40-45% July 2024.  New referral to pharmacy placed for assistance with medications.  Recommend: - Reminded to call for an overnight weight gain of >2 pounds or a weekly weight gain of >5 pounds - not adding salt to food and read food labels. Reviewed the importance of keeping daily sodium intake to 2000mg  daily. - Avoid NSAIDS

## 2023-08-11 NOTE — Progress Notes (Signed)
BP 108/66   Pulse 71   Temp 98.2 F (36.8 C) (Oral)   Ht 5\' 10"  (1.778 m)   Wt 205 lb (93 kg)   SpO2 96%   BMI 29.41 kg/m    Subjective:    Patient ID: Stuart Harvey, male    DOB: [birth year], 72 y.o.   MRN: [id number]  HPI: Stuart Harvey is a 72 y.o. male presenting on 08/11/2023 for Medicare Wellness + follow-up.  Current medical complaints include:none  He currently lives with: wife Interim Problems from his last visit: no  HYPERTENSION / HYPERLIPIDEMIA/HF Had ICD placement on 07/03/23 for ventricular tachycardia. Follows with cardiology and last saw 02/17/23 for office visit. Last saw vascular 07/18/23 for his AAA, had stents in 2014.     Meds: Entresto, Metoprolol XL, Rosuvastatin, Zetia, Apixaban, and Amiodarone Satisfied with current treatment? yes Duration of hypertension: chronic BP monitoring frequency: every morning BP range: on average 130/80 range BP medication side effects: no Duration of hyperlipidemia: chronic Cholesterol medication side effects: no Cholesterol supplements: none Medication compliance: good compliance Aspirin: no Recent stressors: no Recurrent headaches: no Visual changes: no Palpitations: no Dyspnea: occasionally with exerting self Chest pain: a little tight since fractured ribs with CPR Lower extremity edema: no Dizzy/lightheaded: if gets up to quickly  The 10-year ASCVD risk score (Arnett DK, et al., 2019) is: 15.8%   Values used to calculate the score:     Age: 40 years     Sex: Male     Is Non-Hispanic African American: No     Diabetic: No     Tobacco smoker: No     Systolic Blood Pressure: 108 mmHg     Is BP treated: Yes     HDL Cholesterol: 49 mg/dL     Total Cholesterol: 143 mg/dL  ATRIAL FIBRILLATION Taking Eliquis.  He gets assist with Entresto, but was unable to with Eliquis. Atrial fibrillation status: stable Satisfied with current treatment: yes  Medication side effects:  no Medication compliance: good  compliance Etiology of atrial fibrillation:  Palpitations:  no Chest pain:  as above Dyspnea on exertion:  no Orthopnea:  no Syncope:  no Edema:  no Ventricular rate control: BB Anti-coagulation: long acting   HYPOTHYROIDISM Continues on Levothyroxine 100 MCG. Thyroid control status:stable Satisfied with current treatment? yes Medication side effects: no Medication compliance: good compliance Etiology of hypothyroidism:  Recent dose adjustment:no Fatigue: no Cold intolerance: no Heat intolerance: no Weight gain: no Weight loss: no Constipation: no Diarrhea/loose stools: no Palpitations: no Lower extremity edema: no Anxiety/depressed mood: no    CHRONIC KIDNEY DISEASE Saw nephrology last 04/18/23, labs performed with them. CKD status: stable Medications renally dose: yes Previous renal evaluation: no Pneumovax:  Up to Date Influenza Vaccine:  Up to Date   DEPRESSION Continues on Wellbutrin 200 MG BID and Celexa 20 MG daily.   Mood status: stable Satisfied with current treatment?: no Symptom severity: mild  Duration of current treatment : chronic Side effects: no Medication compliance: good compliance Psychotherapy/counseling: none Depressed mood: occasional Anxious mood: occasional Anhedonia: no Significant weight loss or gain: no Insomnia: occasionally Fatigue: no Feelings of worthlessness or guilt: no Impaired concentration/indecisiveness: no Suicidal ideations: no Hopelessness: no Crying spells: no    08/11/2023    3:17 PM 06/12/2023    1:13 PM 02/28/2023   10:34 AM 01/11/2023   10:36 AM 10/11/2022   10:46 AM  Depression screen PHQ 2/9  Decreased Interest 0 0 1 0 1  Down, Depressed, Hopeless 0 1 0 0 0  PHQ - 2 Score 0 1 1 0 1  Altered sleeping 1 1 1 1 1   Tired, decreased energy 1 0 1 1 1   Change in appetite 0 0 0 0 0  Feeling bad or failure about yourself  0 0 0 0 0  Trouble concentrating 0 0 1 0 1  Moving slowly or fidgety/restless 0 0 0 0 0   Suicidal thoughts 0 0 0 0 0  PHQ-9 Score 2 2 4 2 4   Difficult doing work/chores Not difficult at all  Not difficult at all Not difficult at all Not difficult at all       08/11/2023    3:18 PM 06/12/2023    1:14 PM 01/11/2023   10:36 AM 10/11/2022   10:46 AM  GAD 7 : Generalized Anxiety Score  Nervous, Anxious, on Edge 0 0 0 0  Control/stop worrying 1 0 0 0  Worry too much - different things 1 1 0 0  Trouble relaxing 0 0 0 0  Restless 0 0 0 0  Easily annoyed or irritable 1 0 0 1  Afraid - awful might happen 1 0 0 1  Total GAD 7 Score 4 1 0 2  Anxiety Difficulty Somewhat difficult  Not difficult at all Not difficult at all   Functional Status Survey: Is the patient deaf or have difficulty hearing?: No Does the patient have difficulty seeing, even when wearing glasses/contacts?: No Does the patient have difficulty concentrating, remembering, or making decisions?: No Does the patient have difficulty walking or climbing stairs?: No Does the patient have difficulty dressing or bathing?: No Does the patient have difficulty doing errands alone such as visiting a doctor's office or shopping?: No  FALL RISK:    08/11/2023    3:17 PM 06/12/2023    1:13 PM 01/11/2023   10:36 AM 10/11/2022   10:45 AM 09/12/2022   11:09 AM  Fall Risk   Falls in the past year? 0 1 0 0 0  Number falls in past yr: 0 1 0 0 0  Injury with Fall? 0 1 0 0 0  Risk for fall due to : No Fall Risks History of fall(s) No Fall Risks No Fall Risks No Fall Risks  Follow up Falls evaluation completed;Education provided;Falls prevention discussed Falls evaluation completed Falls evaluation completed Falls evaluation completed Falls evaluation completed;Education provided;Falls prevention discussed   Advanced Directives Does patient have a HCPOA?    yes If yes, name and contact information: wife Does patient have a living will or MOST form?  yes  Past Medical History:  Past Medical History:  Diagnosis Date   AAA  (abdominal aortic aneurysm) (HCC)    a.) s/p EVAR in 08/2007 by Dr. Earnestine Leys. b.) CTA on 06/30/2021 --> interval aneurysmal dilitation superior to stent graft with the juxtarenal aorta measuring 3.8 cm.   Anemia    Arthritis    both knees   Chronic anticoagulation    Apixaban   Chronic HFrEF (heart failure with reduced ejection fraction) (HCC)    a. 07/2022 Echo: EF 35-40%, glob HK, mild LVH, GrI DD, nl RV fxn, mildly dil RA/LA, mild MR, mild AoV sclerosis. Asc Ao 40mm.   CKD (chronic kidney disease), stage II    Coronary artery disease    a. 06/2007 CABGx4 @ [facility] (LIMA-LAD, VG-RPDA, VG-OM3,VG-D2); b. 2011 Cath: patent grafts EF 40%, c.06/05/18 Cath: Sev native dzs, LIMA-LAD patent, VG-RPDA patent, VG-OM3 mid-graft 60% and  80%; d. 07/2022 Cath: LM nl, LAD 50p, 90p/m, D1 70, RI  30, LCX 40ost/p, 100p CTO, OM3 100, RCA 100p CTO, LIMA->LAD ok, VG->D2 ok, VG->RPDA min irregs, VG->OM3 100->Med rx.   Depression    GERD (gastroesophageal reflux disease)    Hernia of abdominal cavity    History of 2019 novel coronavirus disease (COVID-19) 09/2019   Hyperlipidemia    Hypertension    Hypothyroidism    Iliac artery aneurysm, right (HCC)    a.) at the distal landing zone of the right iliac limb measuring 3 cm   Ischemic cardiomyopathy    Right middle lobe pulmonary nodule 06/30/2021   a.) measured 8 mm by CT on 06/30/2021.   S/P CABG x 4 06/25/2007   a.) LIMA-LAD, SVG-RPDA, SVG-OM3, SVG-D2   Sigmoid diverticulosis    Ventricular tachycardia (HCC) 05/2018   a.) found unresponsive by EMS; VT noted --> defibrillation achieved ROSC. Briefly intubated. Episode felt to be secondary to heat stoke.    Surgical History:  Past Surgical History:  Procedure Laterality Date   ABDOMINAL AORTIC ANEURYSM REPAIR N/A 08/29/2007   Procedure: EVAR; Location: [facility]; Surgeon: Brynda Greathouse, MD   ABDOMINAL AORTIC ANEURYSM REPAIR N/A 12/05/2012   Procedure: Abdominal aortic aneurysm of approximately 6 cm in maximal diameter  status post previous endovascular repair with type I and III endoleaks; Location: [facility]; Surgeon: Festus Barren, MD   CARDIAC CATHETERIZATION Left 06/28/2010   3v CAD with patent CABG grafts; LVEF 40%; stable aortic stent graft; Location: [facility]; Surgeon: Arnoldo Hooker, MD   CARDIAC CATHETERIZATION Left 06/18/2007   3v CAD; LVEF 50%; refer to CVTS for CABG; Location: [facility]; Surgeon: [name], MD   CARDIOVERSION N/A 05/22/2023   Procedure: CARDIOVERSION;  Surgeon: Iran Ouch, MD;  Location: [facility] ORS;  Service: Cardiovascular;  Laterality: N/A;   COLONOSCOPY WITH PROPOFOL N/A 11/17/2021   Procedure: COLONOSCOPY WITH PROPOFOL;  Surgeon: Toney Reil, MD;  Location: [facility] ENDOSCOPY;  Service: Gastroenterology;  Laterality: N/A;   CORONARY ARTERY BYPASS GRAFT N/A 06/25/2007   Procedure: 4v CABG (LIMA-LAD, SVG-RPDA, SVG-OM3, SVG-D2); Location: [facility]; Surgeon: Marshell Garfinkel, MD   CORONARY/GRAFT ANGIOGRAPHY N/A 06/05/2018   Procedure: Rosalie Doctor ANGIOGRAPHY;  Surgeon: Yvonne Kendall, MD;  Location: [facility] INVASIVE CV LAB;  Service: Cardiovascular;  Laterality: N/A;   ELECTROPHYSIOLOGY STUDY N/A 05/26/2023   Procedure: ELECTROPHYSIOLOGY STUDY;  Surgeon: Lanier Prude, MD;  Location: [facility] INVASIVE CV LAB;  Service: Cardiovascular;  Laterality: N/A;   ENDOVASCULAR REPAIR/STENT GRAFT N/A 08/04/2021   Procedure: ENDOVASCULAR REPAIR/STENT GRAFT;  Surgeon: Annice Needy, MD;  Location: [facility] INVASIVE CV LAB;  Service: Cardiovascular;  Laterality: N/A;   EXPLORATORY LAPAROTOMY  1997   HERNIA REPAIR     ICD IMPLANT N/A 07/03/2023   Procedure: ICD IMPLANT;  Surgeon: Lanier Prude, MD;  Location: [facility] INVASIVE CV LAB;  Service: Cardiovascular;  Laterality: N/A;   KNEE ARTHROPLASTY Left 09/19/2016   Procedure: COMPUTER ASSISTED TOTAL KNEE ARTHROPLASTY;  Surgeon: Donato [name], MD;  Location: [facility] ORS;  Service: Orthopedics;  Laterality: Left;   RIGHT/LEFT HEART CATH AND CORONARY ANGIOGRAPHY N/A  08/05/2022   Procedure: RIGHT/LEFT HEART CATH AND CORONARY ANGIOGRAPHY;  Surgeon: Iran Ouch, MD;  Location: [facility] INVASIVE CV LAB;  Service: Cardiovascular;  Laterality: N/A;   TOTAL KNEE ARTHROPLASTY Right 09/2016    Medications:  Current Outpatient Medications on File Prior to Visit  Medication Sig   acetaminophen (TYLENOL) 500 MG tablet Take 1,000 mg by mouth every 6 (six) hours  as needed for moderate pain.   amiodarone (PACERONE) 200 MG tablet Take 2 tablets (400mg ) twice daily for 5 days, then 1 tablet (200mg ) twice daily for 5 days, and then 1 table (200mg ) once daily. (Patient taking differently: Take 200 mg by mouth daily.)   apixaban (ELIQUIS) 5 MG TABS tablet Take 1 tablet (5 mg total) by mouth 2 (two) times daily.   buPROPion (WELLBUTRIN SR) 200 MG 12 hr tablet Take 1 tablet (200 mg total) by mouth 2 (two) times daily.   Cholecalciferol 50 MCG (2000 UT) CAPS Take 1 capsule by mouth daily.    citalopram (CELEXA) 40 MG tablet Take 40 mg by mouth daily.   levothyroxine (SYNTHROID) 100 MCG tablet Take 100 mcg by mouth daily before breakfast.   metoprolol succinate (TOPROL XL) 25 MG 24 hr tablet Take 0.5 tablets (12.5 mg total) by mouth daily.   nitroGLYCERIN (NITROSTAT) 0.4 MG SL tablet Place 1 tablet (0.4 mg total) under the tongue every 5 (five) minutes x 3 doses as needed for chest pain.   omeprazole (PRILOSEC) 20 MG capsule Take 1 capsule (20 mg total) by mouth daily.   OVER THE COUNTER MEDICATION Take 1-2 tablets by mouth at bedtime as needed (sleep). Relaxium sleep aid   sacubitril-valsartan (ENTRESTO) 24-26 MG Take 1 tablet by mouth 2 (two) times daily.   No current facility-administered medications on file prior to visit.    Allergies:  Allergies  Allergen Reactions   Benzodiazepines     Paradoxical agitation and delirium   Codeine Nausea Only   Tetracycline Rash    Social History:  Social History   Socioeconomic History   Marital status: Married    Spouse  name: Not on file   Number of children: Not on file   Years of education: 14   Highest education level: 12th grade  Occupational History   Occupation: retired  Tobacco Use   Smoking status: Former    Current packs/day: 0.00    Average packs/day: 1 pack/day for 35.0 years (35.0 ttl pk-yrs)    Types: Cigarettes    Start date: 12/16/1970    Quit date: 12/16/2005    Years since quitting: 17.6    Passive exposure: Past   Smokeless tobacco: Current    Types: Chew   Tobacco comments:    pt states he very rarely uses smokeless tobacco   Vaping Use   Vaping status: Never Used  Substance and Sexual Activity   Alcohol use: No   Drug use: No   Sexual activity: Yes    Partners: Female  Other Topics Concern   Not on file  Social History Narrative   Not on file   Social Determinants of Health   Financial Resource Strain: Low Risk  (08/07/2023)   Overall Financial Resource Strain (CARDIA)    Difficulty of Paying Living Expenses: Not hard at all  Food Insecurity: No Food Insecurity (08/07/2023)   Hunger Vital Sign    Worried About Running Out of Food in the Last Year: Never true    Ran Out of Food in the Last Year: Never true  Transportation Needs: No Transportation Needs (08/07/2023)   PRAPARE - Administrator, Civil Service (Medical): No    Lack of Transportation (Non-Medical): No  Physical Activity: Insufficiently Active (08/11/2023)   Exercise Vital Sign    Days of Exercise per Week: 2 days    Minutes of Exercise per Session: 20 min  Stress: Stress Concern Present (08/07/2023)  [employer] of Occupational Health - Occupational Stress Questionnaire    Feeling of Stress : To some extent  Social Connections: Socially Integrated (08/07/2023)   Social Connection and Isolation Panel [NHANES]    Frequency of Communication with Friends and Family: More than three times a week    Frequency of Social Gatherings with Friends and Family: More than three times a week    Attends  Religious Services: More than 4 times per year    Active Member of [employer] or Organizations: Yes    Attends Engineer, structural: More than 4 times per year    Marital Status: Married  Catering manager Violence: Not At Risk (05/25/2023)   Humiliation, Afraid, Rape, and Kick questionnaire    Fear of Current or Ex-Partner: No    Emotionally Abused: No    Physically Abused: No    Sexually Abused: No   Social History   Tobacco Use  Smoking Status Former   Current packs/day: 0.00   Average packs/day: 1 pack/day for 35.0 years (35.0 ttl pk-yrs)   Types: Cigarettes   Start date: 12/16/1970   Quit date: 12/16/2005   Years since quitting: 17.6   Passive exposure: Past  Smokeless Tobacco Current   Types: Chew  Tobacco Comments   pt states he very rarely uses smokeless tobacco    Social History   Substance and Sexual Activity  Alcohol Use No    Family History:  Family History  Problem Relation Age of Onset   Heart disease Mother    Heart attack Mother    Hypertension Mother    Heart attack Father    Hypertension Father    Kidney disease Brother    Depression Maternal Grandmother    Depression Maternal Grandfather    Heart disease Other    Heart attack Other    Prostate cancer Neg Hx    Kidney cancer Neg Hx    Stomach cancer Neg Hx    Colon cancer Neg Hx    Past medical history, surgical history, medications, allergies, family history and social history reviewed with patient today and changes made to appropriate areas of the chart.   ROS All other ROS negative except what is listed above and in the HPI.      Objective:    BP 108/66   Pulse 71   Temp 98.2 F (36.8 C) (Oral)   Ht 5\' 10"  (1.778 m)   Wt 205 lb (93 kg)   SpO2 96%   BMI 29.41 kg/m   Wt Readings from Last 3 Encounters:  08/11/23 205 lb (93 kg)  07/18/23 202 lb 3.2 oz (91.7 kg)  07/03/23 201 lb (91.2 kg)    Physical Exam Vitals and nursing note reviewed.  Constitutional:      General: He is  awake. He is not in acute distress.    Appearance: He is well-developed and well-groomed. He is not ill-appearing or toxic-appearing.  HENT:     Head: Normocephalic and atraumatic.     Right Ear: Hearing, tympanic membrane, ear canal and external ear normal. No drainage.     Left Ear: Hearing, tympanic membrane, ear canal and external ear normal. No drainage.     Nose: Nose normal.     Mouth/Throat:     Pharynx: Uvula midline.  Eyes:     General: Lids are normal.        Right eye: No discharge.        Left eye: No discharge.  Extraocular Movements: Extraocular movements intact.     Conjunctiva/sclera: Conjunctivae normal.     Pupils: Pupils are equal, round, and reactive to light.     Visual Fields: Right eye visual fields normal and left eye visual fields normal.  Neck:     Thyroid: No thyromegaly.     Vascular: No carotid bruit or JVD.     Trachea: Trachea normal.  Cardiovascular:     Rate and Rhythm: Normal rate and regular rhythm.     Heart sounds: Normal heart sounds, S1 normal and S2 normal. No murmur heard.    No gallop.  Pulmonary:     Effort: Pulmonary effort is normal. No accessory muscle usage or respiratory distress.     Breath sounds: Normal breath sounds.  Abdominal:     General: Bowel sounds are normal.     Palpations: Abdomen is soft. There is no hepatomegaly or splenomegaly.     Tenderness: There is no abdominal tenderness.  Musculoskeletal:        General: Normal range of motion.     Cervical back: Normal range of motion and neck supple.     Right lower leg: No edema.     Left lower leg: No edema.  Lymphadenopathy:     Head:     Right side of head: No submental, submandibular, tonsillar, preauricular or posterior auricular adenopathy.     Left side of head: No submental, submandibular, tonsillar, preauricular or posterior auricular adenopathy.     Cervical: No cervical adenopathy.  Skin:    General: Skin is warm and dry.     Capillary Refill: Capillary  refill takes less than 2 seconds.     Findings: No rash.  Neurological:     Mental Status: He is alert and oriented to person, place, and time.     Gait: Gait is intact.     Deep Tendon Reflexes: Reflexes are normal and symmetric.     Reflex Scores:      Brachioradialis reflexes are 2+ on the right side and 2+ on the left side.      Patellar reflexes are 2+ on the right side and 2+ on the left side. Psychiatric:        Attention and Perception: Attention normal.        Mood and Affect: Mood normal.        Speech: Speech normal.        Behavior: Behavior normal. Behavior is cooperative.        Thought Content: Thought content normal.        Cognition and Memory: Cognition normal.       08/11/2023    3:51 PM 08/09/2022   11:05 AM 08/06/2021   11:23 AM 08/03/2020   11:23 AM 07/12/2018    2:03 PM  6CIT Screen  What Year? 0 points 0 points 0 points 0 points 0 points  What month? 0 points 0 points 0 points 0 points 0 points  What time? 0 points 0 points 0 points 0 points 0 points  Count back from 20 0 points 0 points 0 points 0 points 0 points  Months in reverse 0 points 0 points 0 points 0 points 0 points  Repeat phrase 0 points 2 points 0 points 0 points 0 points  Total Score 0 points 2 points 0 points 0 points 0 points   Results for orders placed or performed in visit on 07/27/23  TSH  Result Value Ref Range   TSH 9.550 (H)  0.450 - 4.500 uIU/mL  T4, free  Result Value Ref Range   Free T4 1.41 0.82 - 1.77 ng/dL      Assessment & Plan:   Problem List Items Addressed This Visit       Cardiovascular and Mediastinum   AAA (abdominal aortic aneurysm) (HCC)    Followed by cardiology and vascular.  History of stent in 2014.  Continue to collaborate with team members.  Recent notes reviewed.      Relevant Medications   rosuvastatin (CRESTOR) 40 MG tablet   ezetimibe (ZETIA) 10 MG tablet   Other Relevant Orders   Amb Referral to Clinical Pharmacist   Benign hypertension with  CKD (chronic kidney disease) stage III (HCC)    Chronic, stable.  BP well below goal for age today.  Monitor closely as is on Toprol XL and Wellbutrin.  Recommend he monitor BP at least a few mornings a week at home and document.  DASH diet at home.  Continue current medication regimen and adjust as needed.  Labs today: up to date.       Relevant Medications   rosuvastatin (CRESTOR) 40 MG tablet   ezetimibe (ZETIA) 10 MG tablet   Other Relevant Orders   Amb Referral to Clinical Pharmacist   Heart failure with reduced ejection fraction (HCC)    Chronic, ongoing.  Euvolemic on exam today. Recent EF stable at 40-45% July 2024.  New referral to pharmacy placed for assistance with medications.  Recommend: - Reminded to call for an overnight weight gain of >2 pounds or a weekly weight gain of >5 pounds - not adding salt to food and read food labels. Reviewed the importance of keeping daily sodium intake to 2000mg  daily. - Avoid NSAIDS      Relevant Medications   rosuvastatin (CRESTOR) 40 MG tablet   ezetimibe (ZETIA) 10 MG tablet   Paroxysmal atrial fibrillation (HCC)    Chronic, ongoing.  Rate controlled at this time, on Amiodarone currently.  Continue  collaboration with cardiology, recent notes reviewed. Labs up to date, monitor thyroid closely.      Relevant Medications   rosuvastatin (CRESTOR) 40 MG tablet   ezetimibe (ZETIA) 10 MG tablet   Other Relevant Orders   Amb Referral to Clinical Pharmacist   VT (ventricular tachycardia) (HCC)    Stable, has ICD placed on 07/03/23.  Continue collaboration with cardiology.  Recent notes reviewed.      Relevant Medications   rosuvastatin (CRESTOR) 40 MG tablet   ezetimibe (ZETIA) 10 MG tablet   Other Relevant Orders   Amb Referral to Clinical Pharmacist     Endocrine   Hypothyroid    Chronic, ongoing.  Continue current medication regimen and adjust as needed.  TSH and Free T4 today.      Relevant Medications   levothyroxine  (SYNTHROID) 100 MCG tablet   Other Relevant Orders   T4, free   TSH     Genitourinary   CKD (chronic kidney disease) stage 3, GFR 30-59 ml/min (HCC)    Chronic, ongoing CKD 3a. Followed by nephrology.  Continue current medication regimen and adjust as needed.  Labs: up to date.        Other   Depression    Chronic, stable.  Continue Celexa 20 MG due to age >110 and risks with 40 MG dosing and continue Wellbutrin.  Denies SI/HI.  Monitor Wellbutrin and Toprol XL use + Celexa and Omeprazole -- discussed may need to consider mood medication adjustments in  future.       Hyperlipidemia    Chronic, stable.  Continue current medication regimen and adjust as needed.  Lipid panel up to date.      Relevant Medications   rosuvastatin (CRESTOR) 40 MG tablet   ezetimibe (ZETIA) 10 MG tablet   ICD (implantable cardioverter-defibrillator) in place    Placed 07/03/23, tolerating well.  Continue collaboration with cardiology.      Other Visit Diagnoses     Medicare annual wellness visit, subsequent    -  Primary   Medicare wellness today, reviewed health maintenance.       Discussed aspirin prophylaxis for myocardial infarction prevention and decision was it was not indicated  LABORATORY TESTING:  Health maintenance labs ordered today as discussed above.   The natural history of prostate cancer and ongoing controversy regarding screening and potential treatment outcomes of prostate cancer has been discussed with the patient. The meaning of a false positive PSA and a false negative PSA has been discussed. He indicates understanding of the limitations of this screening test and wishes not to proceed with screening PSA testing.  Screening not recommended >70, discussed with him.  IMMUNIZATIONS:   - Tdap: Tetanus vaccination status reviewed: last tetanus booster within 10 years. - Influenza: Refused - Pneumovax: Up to date - Prevnar: Up to date - Zostavax vaccine: Refused  SCREENING: -  Colonoscopy: Up to date  Discussed with patient purpose of the colonoscopy is to detect colon cancer at curable precancerous or early stages   - AAA Screening: Up to date  -Hearing Test: Not applicable  -Spirometry: Not applicable   PATIENT COUNSELING:    Sexuality: Discussed sexually transmitted diseases, partner selection, use of condoms, avoidance of unintended pregnancy  and contraceptive alternatives.   Advised to avoid cigarette smoking.  I discussed with the patient that most people either abstain from alcohol or drink within safe limits (<=14/week and <=4 drinks/occasion for males, <=7/weeks and <= 3 drinks/occasion for females) and that the risk for alcohol disorders and other health effects rises proportionally with the number of drinks per week and how often a drinker exceeds daily limits.  Discussed cessation/primary prevention of drug use and availability of treatment for abuse.   Diet: Encouraged to adjust caloric intake to maintain  or achieve ideal body weight, to reduce intake of dietary saturated fat and total fat, to limit sodium intake by avoiding high sodium foods and not adding table salt, and to maintain adequate dietary potassium and calcium preferably from fresh fruits, vegetables, and low-fat dairy products.    Stressed the importance of regular exercise  Injury prevention: Discussed safety belts, safety helmets, smoke detector, smoking near bedding or upholstery.   Dental health: Discussed importance of regular tooth brushing, flossing, and dental visits.   Follow up plan: NEXT PREVENTATIVE PHYSICAL DUE IN 1 YEAR. Return in about 6 months (around 02/08/2024) for HTN/HLD, CKD, HF, MOOD, BPH.

## 2023-08-11 NOTE — Assessment & Plan Note (Addendum)
Followed by cardiology and vascular.  History of stent in 2014.  Continue to collaborate with team members.  Recent notes reviewed.

## 2023-08-11 NOTE — Assessment & Plan Note (Addendum)
Chronic, stable.  Continue current medication regimen and adjust as needed.  Lipid panel up to date.

## 2023-08-11 NOTE — Assessment & Plan Note (Signed)
Chronic, ongoing.  Continue current medication regimen and adjust as needed.  TSH and Free T4 today. 

## 2023-08-11 NOTE — Assessment & Plan Note (Signed)
Chronic, ongoing.  Rate controlled at this time, on Amiodarone currently.  Continue  collaboration with cardiology, recent notes reviewed. Labs up to date, monitor thyroid closely.

## 2023-08-11 NOTE — Assessment & Plan Note (Signed)
Chronic, stable.  BP well below goal for age today.  Monitor closely as is on Toprol XL and Wellbutrin.  Recommend he monitor BP at least a few mornings a week at home and document.  DASH diet at home.  Continue current medication regimen and adjust as needed.  Labs today: up to date.

## 2023-08-12 LAB — T4, FREE: Free T4: 1.59 ng/dL (ref 0.82–1.77)

## 2023-08-12 LAB — TSH: TSH: 6.12 u[IU]/mL — ABNORMAL HIGH (ref 0.450–4.500)

## 2023-08-13 ENCOUNTER — Other Ambulatory Visit: Payer: Self-pay | Admitting: Nurse Practitioner

## 2023-08-13 DIAGNOSIS — E039 Hypothyroidism, unspecified: Secondary | ICD-10-CM

## 2023-08-13 MED ORDER — LEVOTHYROXINE SODIUM 125 MCG PO TABS: 125.0000 ug | ORAL_TABLET | Freq: Every day | ORAL | 3 refills | Status: DC

## 2023-08-13 NOTE — Progress Notes (Signed)
Contacted via MyChart: please call patient though and alert them of results as they do not always check + need lab only visit in 6 weeks outpatient please:    Good morning Stuart Harvey, your labs have returned and thyroid labs remain a little elevated.  Please change to the 125 MCG dosing I am sending in and stop 100 MCG.  We will recheck labs outpatient in 6 weeks.  Any questions?

## 2023-08-14 ENCOUNTER — Telehealth: Payer: Self-pay

## 2023-08-14 NOTE — Progress Notes (Signed)
Care Guide Note  08/14/2023 Name: Stuart Harvey MRN: [id number] DOB: 09/28/1951  Referred by: Marjie Skiff, NP Reason for referral : Care Coordination (Outreach to schedule with Pharm d )   Stuart Harvey is a 72 y.o. year old male who is a primary care patient of Cannady, Dorie Rank, NP. Stuart Harvey was referred to the pharmacist for assistance related to Atrial Fibrillation.    Successful contact was made with the patient to discuss pharmacy services including being ready for the pharmacist to call at least 5 minutes before the scheduled appointment time, to have medication bottles and any blood sugar or blood pressure readings ready for review. The patient agreed to meet with the pharmacist via with the pharmacist via telephone visit on (date/time).  08/23/2023   Penne Lash, RMA Care Guide [facility]  [place], [place] [address] Direct Dial: [phone] [email or web address]

## 2023-08-17 ENCOUNTER — Other Ambulatory Visit: Payer: Self-pay | Admitting: Nurse Practitioner

## 2023-08-17 NOTE — Telephone Encounter (Signed)
Requested medications are due for refill today.  unsure  Requested medications are on the active medications list.  yes  Last refill. 06/14/2023   Future visit scheduled.   yes  Notes to clinic.  Medication is historical.    Requested Prescriptions  Pending Prescriptions Disp Refills   citalopram (CELEXA) 40 MG tablet [Pharmacy Med Name: CITALOPRAM HBR 40 MG TABLET] 90 tablet 0    Sig: Take 1 tablet (40 mg total) by mouth daily.     Psychiatry:  Antidepressants - SSRI Passed - 08/17/2023  4:38 PM      Passed - Completed PHQ-2 or PHQ-9 in the last 360 days      Passed - Valid encounter within last 6 months    Recent Outpatient Visits           6 days ago Medicare annual wellness visit, subsequent   [place] [facility] [place], Corrie Dandy T, NP   2 months ago Cardiac arrest [facility])   [place] [facility] [place], Corrie Dandy T, NP   7 months ago Benign hypertension with CKD (chronic kidney disease) stage III (HCC)   [place] [facility] [place], Corrie Dandy T, NP   10 months ago Breast pain, left   [place] [facility] Gabriel Cirri, NP   1 year ago Hyperlipidemia, unspecified hyperlipidemia type   [place] [facility] Vigg, Avanti, MD       Future Appointments             In 5 months Cannady, Dorie Rank, NP [facility], [facility]

## 2023-08-18 ENCOUNTER — Telehealth: Payer: Self-pay | Admitting: Nurse Practitioner

## 2023-08-18 NOTE — Telephone Encounter (Signed)
Pt is calling in requesting to speak with Tobi Bastos. Pt says he spoke with her earlier and needs to speak with her again. Please follow up with pt.

## 2023-08-18 NOTE — Telephone Encounter (Signed)
Spoke the pt--just requesting for Jolene to send [facility] message when sending  the new med.

## 2023-08-18 NOTE — Telephone Encounter (Signed)
This message was relayed to the wrong Tobi Bastos, I let the CMA Tobi Bastos know to call the patient back.  She done so at 3:33 pm.

## 2023-08-18 NOTE — Telephone Encounter (Signed)
Good afternoon, this patient will be calling on Monday to see how long Amiodarone has to be on board.  His Celexa we may need to change if needs to be on lifelong due to high risk for QT prolongation, but I told him if this is the case we can switch to Effexor which does not interact and works well for mood.  He just wanted to ask you about the Amiodarone and length of time.

## 2023-08-18 NOTE — Telephone Encounter (Signed)
Pt notified regarding medication and voiced understanding. Pt agreed to try the alternative medication.

## 2023-08-18 NOTE — Telephone Encounter (Signed)
Please advise 

## 2023-08-18 NOTE — Telephone Encounter (Signed)
Message was delivered to [facility]

## 2023-08-19 ENCOUNTER — Other Ambulatory Visit: Payer: Self-pay | Admitting: Nurse Practitioner

## 2023-08-21 ENCOUNTER — Telehealth: Payer: Self-pay | Admitting: Cardiovascular Disease

## 2023-08-21 ENCOUNTER — Telehealth: Payer: Self-pay | Admitting: Nurse Practitioner

## 2023-08-21 NOTE — Telephone Encounter (Signed)
Copied from CRM [id number]. Topic: General - Other >> Aug 21, 2023  1:11 PM Santiya F wrote: Reason for CRM: Pt is calling in requesting that Tobi Bastos call him back. Pt says it's regarding financial assistance.

## 2023-08-21 NOTE — Telephone Encounter (Signed)
Pt c/o medication issue:  1. Name of Medication:  amiodarone (PACERONE) 200 MG tablet  2. How are you currently taking this medication (dosage and times per day)?   3. Are you having a reaction (difficulty breathing--STAT)?   4. What is your medication issue?   Patient's wife states PCP has concerns with him taking Amiodarone with another medication and she would like to clarify how long he will be taking Amiodarone.

## 2023-08-21 NOTE — Telephone Encounter (Signed)
Pt is talking about Tobi Bastos, the float that we had last week.

## 2023-08-21 NOTE — Telephone Encounter (Signed)
Pt is calling back to speak to someone regarding patient assistance for citalopram (CELEXA) 40 MG tablet [[id number]] . Pt reports that he has one more pill left. CB- [phone]

## 2023-08-21 NOTE — Telephone Encounter (Signed)
Requested Prescriptions  Refused Prescriptions Disp Refills   citalopram (CELEXA) 40 MG tablet [Pharmacy Med Name: CITALOPRAM HBR 40 MG TABLET] 90 tablet 0    Sig: Take 1 tablet (40 mg total) by mouth daily.     Psychiatry:  Antidepressants - SSRI Passed - 08/19/2023  1:10 PM      Passed - Completed PHQ-2 or PHQ-9 in the last 360 days      Passed - Valid encounter within last 6 months    Recent Outpatient Visits           1 week ago Medicare annual wellness visit, subsequent   [place] [facility] [place], [place] T, NP   2 months ago Cardiac arrest [facility])   [place] [facility] [place], Corrie Dandy T, NP   7 months ago Benign hypertension with CKD (chronic kidney disease) stage III (HCC)   [place] [facility] [place], Corrie Dandy T, NP   10 months ago Breast pain, left   [place] [facility] Gabriel Cirri, NP   1 year ago Hyperlipidemia, unspecified hyperlipidemia type   [place] [facility] Vigg, Avanti, MD       Future Appointments             In 5 months Cannady, Dorie Rank, NP [place] [facility], [facility]

## 2023-08-21 NOTE — Telephone Encounter (Signed)
Called and spoke to patient. Patient handed the phone over to his wife regarding medications. Reviewed patient's chart and the telephone encounter from Friday. Per Jolene, patient was going to call Dr. Jari Sportsman office today about his Amiodarone and length of treatment with this medication. Patient was to call us back after speaking with Dr. Jari Sportsman office to see which route to take with mood medication from our office. Patient's wife states they will contact Dr. Jari Sportsman office now and then call us back to let us know what Dr. Jari Sportsman office says regarding his medication.

## 2023-08-22 ENCOUNTER — Encounter: Payer: Self-pay | Admitting: Nurse Practitioner

## 2023-08-22 ENCOUNTER — Encounter: Payer: Self-pay | Admitting: Cardiovascular Disease

## 2023-08-22 NOTE — Telephone Encounter (Signed)
I only did her discharge summary from her admission in 05/2023 so I would defer to her primary Cardiologist. It looks like patient sent a MyChart message today though asking about taking Amiodarone and Citalopram together and Pharmacy has responded.   Thanks! Malillany Kazlauskas

## 2023-08-23 ENCOUNTER — Other Ambulatory Visit: Payer: Medicare HMO

## 2023-08-23 MED ORDER — VENLAFAXINE HCL 50 MG PO TABS: 50.0000 mg | ORAL_TABLET | Freq: Two times a day (BID) | ORAL | 4 refills | Status: DC

## 2023-08-23 NOTE — Progress Notes (Signed)
08/23/2023  Patient ID: [name], male   DOB: 07/17/1951, 72 y.o.   MRN: [id number]  S/O Patient outreach to assist with affordability of medication(s) in response to referral placed by PCP, Aura Dials, PA  Medication Access/Affordability -Patient is currently in the coverage gap with his Medicare Part D Plan -Currently enrolled in [employer] PAP for Entresto 24/26mg  BID -Patient is interested in BMS PAP for assistance with Eliquis 5mg  BID affordability.  He does have some medication on hand at this time.  A/P  Medication Access/Affordability -Patient should qualify for BMS PAP for Eliquis 5mg  BID based on HHI, and he/wife are obtaining OOP expense reports from their pharmacies.  They are also likely to have met the programs 3% OOP requirement.   -Coordinating with medication assistance team to initiate Eliquis PAP application process.  Will also have them add patient's Entresto PAP to their spreadsheet, so we can assist with program re-enrollment when due  Follow-up:  Will follow progress of PAP application to keep patient/provider informed  Lenna Gilford, PharmD, DPLA

## 2023-08-23 NOTE — Telephone Encounter (Signed)
See mychart message from patient.

## 2023-08-28 ENCOUNTER — Ambulatory Visit: Payer: Self-pay | Admitting: *Deleted

## 2023-08-28 ENCOUNTER — Encounter: Payer: Self-pay | Admitting: *Deleted

## 2023-08-28 ENCOUNTER — Telehealth: Payer: Self-pay

## 2023-08-28 ENCOUNTER — Other Ambulatory Visit (HOSPITAL_COMMUNITY): Payer: Self-pay

## 2023-08-28 MED ORDER — BUSPIRONE HCL 10 MG PO TABS: 10.0000 mg | ORAL_TABLET | Freq: Two times a day (BID) | ORAL | 4 refills | Status: DC

## 2023-08-28 NOTE — Telephone Encounter (Signed)
  Chief Complaint: Pt. Has been taking the Effexor 50 mg incorrectly per wife you called back in.    (See prior triage notes regarding his symptoms) Symptoms: lightheadedness, dizziness and elevated BP since starting the Effexor on Friday. Frequency: Since starting the medication he is taking 2 tablets in the morning and 1 tablet at night.   He is supposed to be taking 1 tablet in the morning and 1 tablet at night.   Pertinent Negatives: Patient denies N/A Disposition: [] ED /[] Urgent Care (no appt availability in office) / [] Appointment(In office/virtual)/ []  [place] Virtual Care/ [] Home Care/ [] Refused Recommended Disposition /[] [place] Mobile Bus/ [x]  Follow-up with PCP Additional Notes: Message sent to go along with earlier triage call from this morning regarding side effects/symptoms he is having since starting the Effexor Friday.

## 2023-08-28 NOTE — Telephone Encounter (Signed)
Message from Bode[place] M sent at 08/28/2023 10:04 AM EDT  Summary: Dizziness Advise   Pt wife is calling to report that the patient started taking venlafaxine (EFFEXOR) 50 MG tablet [[id number]] on Friday. Reporting dizziness, light headedness over the weekend. And BP 148/89 today. Please advise          Call History  Contact Date/Time Type Contact Phone/Fax User  08/28/2023 10:02 AM EDT Phone (Incoming) Saban,[name] (Emergency Contact) [phone] Judie Petit) Jens Som A   Reason for Disposition  Taking a medicine that could cause dizziness (e.g., blood pressure medications, diuretics)    Effexor 50 mg started Friday.   Been dizzy and lightheaded over the weekend with facial swelling.   Denies swelling of hands, feet or ankles.  Answer Assessment - Initial Assessment Questions 1. DESCRIPTION: "Describe your dizziness."     Returned call and his wife answered.   He is there laying down with a cool cloth on his face because his eyes are swollen and his face is a little puffy.   He was answering questions in the background.    Started Effexor 50 mg on Friday and his BP is 148/89 this morning and his face is swollen upper and lower eyelids too.    This weekend he was out in the sun several hours.  [place]   He is having dizziness and lightheadedness over the weekend.    Denies swelling of lips, tongue, or throat, no shortness of breath or chest tightness.   I let them know if these symptoms developed to go immediately to the ED or call 911.   She verbalized understanding. 2. LIGHTHEADED: "Do you feel lightheaded?" (e.g., somewhat faint, woozy, weak upon standing)     Yes and a little dizzy at times.   3. VERTIGO: "Do you feel like either you or the room is spinning or tilting?" (i.e. vertigo)     No 4. SEVERITY: "How bad is it?"  "Do you feel like you are going to faint?" "Can you stand and walk?"   - MILD: Feels slightly dizzy, but walking normally.   - MODERATE:  Feels unsteady when walking, but not falling; interferes with normal activities (e.g., school, work).   - SEVERE: Unable to walk without falling, or requires assistance to walk without falling; feels like passing out now.      Mild 5. ONSET:  "When did the dizziness begin?"     After starting the Effexor 50 mg on Friday.   6. AGGRAVATING FACTORS: "Does anything make it worse?" (e.g., standing, change in head position)     When he is walking around he has to stop once in a while due to the dizziness. 7. HEART RATE: "Can you tell me your heart rate?" "How many beats in 15 seconds?"  (Note: not all patients can do this)       Not asked 8. CAUSE: "What do you think is causing the dizziness?"     The Effexor that was started Friday. 9. RECURRENT SYMPTOM: "Have you had dizziness before?" If Yes, ask: "When was the last time?" "What happened that time?"     Not asked since associated with Effexor he just started 10. OTHER SYMPTOMS: "Do you have any other symptoms?" (e.g., fever, chest pain, vomiting, diarrhea, bleeding)       Just the facial puffiness and eyelids swollen. 11. PREGNANCY: "Is there any chance you are pregnant?" "When was your last menstrual period?"  N/A  Protocols used: Dizziness - Lightheadedness-A-AH

## 2023-08-28 NOTE — Telephone Encounter (Signed)
-----   Message from Lenna Gilford sent at 08/23/2023  1:45 PM EDT ----- Peri Jefferson afternoon,  Based on [facility], Stuart Harvey would qualify for BMS PAP for Eliquis 5mg  BID; and it is likely he will meet the program's OOP requirement.  Could you all initiate the application process on his behalf while they are requesting OOP report(s) from their pharmacies?  Thank you!

## 2023-08-28 NOTE — Telephone Encounter (Signed)
  Chief Complaint: Facial swelling, dizziness, feeling lightheaded over the weekend after starting Effexor 50 mg Friday.  BP also elevated 148/89 this morning. Symptoms: above Frequency: Started over the weekend after starting the Effexor. Pertinent Negatives: Patient denies being unable to walk due to the dizziness but did have to stop once in a while when walking.   Disposition: [] ED /[] Urgent Care (no appt availability in office) / [] Appointment(In office/virtual)/ []  [place] Virtual [facility]/ [] Home Care/ [] Refused Recommended Disposition /[] [place] [facility]/ [x]  Follow-up with PCP Additional Notes: High priority message sent to Aura Dials, NP.    Pt. And wife agreeable to someone calling them back. I also gave them precautionary symptoms to watch for and go to the ED or call 911 if they occur.   Swelling of lips, tongue or throat, or feel like throat is closing, shortness of breath or chest tightness.   Wife verbalized understanding.

## 2023-08-28 NOTE — Telephone Encounter (Signed)
PAP application for Eliquis has been mailed to pt home. I will fax PCP pages once I receive pt pages

## 2023-08-28 NOTE — Telephone Encounter (Signed)
This encounter was created in error - please disregard.

## 2023-08-28 NOTE — Telephone Encounter (Signed)
Message from [place] E sent at 08/28/2023 11:14 AM EDT  Summary: Pt has been taking his medicine incorrectly   Pt's wife called to report that the patient has been taking 2 in the morning and one at night (of his Effexor) which is NOT the correct amount that he is supposed to take. She believes this is the cause of his symptoms.          Call History  Contact Date/Time Type Contact Phone/Fax User  08/28/2023 11:13 AM EDT Phone (Incoming) Fangman,[name] (Emergency Contact) [phone] Judie Petit) Randol Kern   Reason for Disposition  [1] Caller has URGENT medicine question about med that PCP or specialist prescribed AND [2] triager unable to answer question    Been taking the Effexor incorrectly.   Wife called back in.   (See triage note from earlier today).  Answer Assessment - Initial Assessment Questions 1. NAME of MEDICINE: "What medicine(s) are you calling about?"     Effexor 50 mg   (See prior triage note from today regarding his symptoms/side effects with this medication). 2. QUESTION: "What is your question?" (e.g., double dose of medicine, side effect)    I returned call to wife Easton Fetty and she discovered he got mixed up and was taking 2 tablets in the morning and 1 tablet at night whereas he is supposed to be taking 1 tablet in the morning and 1 tablet in at night. She wanted to let Aura Dials, NP know this.  It may be why he is having the symptoms. 3. PRESCRIBER: "Who prescribed the medicine?" Reason: if prescribed by specialist, call should be referred to that group.     Aura Dials, NP 4. SYMPTOMS: "Do you have any symptoms?" If Yes, ask: "What symptoms are you having?"  "How bad are the symptoms (e.g., mild, moderate, severe)     See prior triage note from earlier today.   Dizziness, lightheaded, and BP elevated. 5. PREGNANCY:  "Is there any chance that you are pregnant?" "When was your last menstrual period?"     N/A  Protocols used: Medication Question  Call-A-AH

## 2023-08-28 NOTE — Telephone Encounter (Signed)
Patient notified and voiced understanding.

## 2023-08-28 NOTE — Addendum Note (Signed)
Addended by: Aura Dials T on: 08/28/2023 01:17 PM   Modules accepted: Orders

## 2023-08-28 NOTE — Telephone Encounter (Signed)
Called and informed patient to discontinue medication per provider.

## 2023-09-05 ENCOUNTER — Telehealth: Payer: Self-pay | Admitting: Cardiovascular Disease

## 2023-09-05 NOTE — Telephone Encounter (Signed)
    Primary Cardiologist:Muhammad Kirke Corin, MD  Chart reviewed as part of pre-operative protocol coverage. Because of Stuart Harvey's past medical history and time since last visit, he/she will require a follow-up visit in order to better assess preoperative cardiovascular risk.  Pre-op covering staff: - Please schedule in office appointment and call patient to inform them. - Please contact requesting surgeon's office via preferred method (i.e, phone, fax) to inform them of need for appointment prior to surgery.  If applicable, this message will also be routed to pharmacy pool and/or primary cardiologist for input on holding anticoagulant/antiplatelet agent as requested below so that this information is available at time of patient's appointment.   Ronney Asters, NP  09/05/2023, 1:22 PM

## 2023-09-05 NOTE — Telephone Encounter (Signed)
S/w the pt and his wife and explained per pre op APP Edd Fabian, FNP pt will need in office appt.   Per FNP: pt had ICD implanted and didn't follow up.   Pt has been scheduled to see Charlsie Quest, NP 09/06/23 @ 10:30. I will update all parties involved.

## 2023-09-05 NOTE — Telephone Encounter (Signed)
Received pt portion, faxed provider portion to [phone]

## 2023-09-05 NOTE — Telephone Encounter (Signed)
Pre-operative Risk Assessment    Patient Name: Stuart Harvey  DOB: 1950-12-30 MRN: [id number]      Request for Surgical Clearance    Procedure:   dental cleaning  Date of Surgery:  Clearance 09/05/23                                 Surgeon:  Dr. Cheral Almas Surgeon's Group or Practice Name:  [facility] Phone number:  [phone] Fax number:  [phone]   Type of Clearance Requested:   - Medical    Type of Anesthesia:  None    Additional requests/questions:      SignedFilomena Jungling   09/05/2023, 12:29 PM

## 2023-09-06 ENCOUNTER — Encounter: Payer: Self-pay | Admitting: Cardiology

## 2023-09-06 ENCOUNTER — Ambulatory Visit: Payer: Medicare HMO | Attending: Cardiology | Admitting: Cardiology

## 2023-09-06 VITALS — BP 96/70 | HR 79 | Ht 71.0 in | Wt 204.8 lb

## 2023-09-06 DIAGNOSIS — I480 Paroxysmal atrial fibrillation: Secondary | ICD-10-CM

## 2023-09-06 DIAGNOSIS — I472 Ventricular tachycardia, unspecified: Secondary | ICD-10-CM

## 2023-09-06 DIAGNOSIS — Z951 Presence of aortocoronary bypass graft: Secondary | ICD-10-CM | POA: Diagnosis not present

## 2023-09-06 DIAGNOSIS — I5020 Unspecified systolic (congestive) heart failure: Secondary | ICD-10-CM

## 2023-09-06 DIAGNOSIS — I2510 Atherosclerotic heart disease of native coronary artery without angina pectoris: Secondary | ICD-10-CM | POA: Diagnosis not present

## 2023-09-06 DIAGNOSIS — N1831 Chronic kidney disease, stage 3a: Secondary | ICD-10-CM

## 2023-09-06 DIAGNOSIS — E039 Hypothyroidism, unspecified: Secondary | ICD-10-CM

## 2023-09-06 DIAGNOSIS — E785 Hyperlipidemia, unspecified: Secondary | ICD-10-CM

## 2023-09-06 DIAGNOSIS — Z01810 Encounter for preprocedural cardiovascular examination: Secondary | ICD-10-CM

## 2023-09-06 DIAGNOSIS — I255 Ischemic cardiomyopathy: Secondary | ICD-10-CM

## 2023-09-06 DIAGNOSIS — I10 Essential (primary) hypertension: Secondary | ICD-10-CM

## 2023-09-06 NOTE — Telephone Encounter (Signed)
Information has been forwarded to his dentist for regular cleaning.

## 2023-09-06 NOTE — Patient Instructions (Signed)
Medication Instructions:  Your physician recommends that you continue on your current medications as directed. Please refer to the Current Medication list given to you today.  *If you need a refill on your cardiac medications before your next appointment, please call your pharmacy*  Lab Work: - None ordered  Testing/Procedures: - None ordered  Follow-Up: At [facility], you and your health needs are our priority.  As part of our continuing mission to provide you with exceptional heart care, we have created designated Provider Care Teams.  These Care Teams include your primary Cardiologist (physician) and Advanced Practice Providers (APPs -  Physician Assistants and Nurse Practitioners) who all work together to provide you with the care you need, when you need it.  Your next appointment:   6 month(s)  Provider:   You may see [name], MD or one of the following Advanced Practice Providers on your designated Care Team:   Nicolasa Ducking, NP Eula Listen, PA-C Cadence Fransico Michael, [place] Charlsie Quest, NP    Other Instructions - Keep 91 day f/u BiV ICD Implant Wednesday 10/04/23

## 2023-09-06 NOTE — Progress Notes (Signed)
Cardiology Office Note:  .   Date:  09/06/2023  ID:  [name], DOB [birth year], MRN [id number] PCP: Marjie Skiff, NP  [facility] Providers Cardiologist:  [name], MD    History of Present Illness: Marland Kitchen   Stuart Harvey is a 72 y.o. male with past medical history of CAD status post CABG x 4 in 2008 (2), chronic HFrEF/ICM, PAF on apixaban, hypertension, hyperlipidemia, infrarenal AAA status post open repair (08/2027), right iliac artery aneurysm status post EVAR (07/2021), hypothyroidism, anemia, depression, GERD, VT arrest status post ICD placement, who is here for follow-up and required a preoperative cardiovascular examination.  Patient was originally admitted in 05/2018 after he was found down on his back porch with possible seizure-like activity.  Per EMR, EMS found patient to be in VT at that time that required defibrillation.  Echocardiogram revealed an LVEF of 50-55%.  Diagnostic heart catheterization showed borderline disease in the SVG to OM 3 but otherwise grahams were patent.  Ultimately presentation was felt to be secondary to recent stroke.  In 06/2022 patient reported worsening dyspnea and left-sided chest pain.  Myoview was ordered and showed an LVEF of 38% with evidence of prior MI.  Subsequent echocardiogram confirmed depressed EF of 35-40% with global hypokinesis.  This lateral R/LHC in 07/2022 which showed severe multivessel native coronary artery disease with occlusion of the SVG ->OM 3 but patent LIMA->LAD, SVG->D2, and SVG -> RPDA.  He presented to [facility] on 05/18/23 via EMS after he was found in the garage sitting on the floor holding his head stating that he was feeling dizzy.  He attempted to stand up but then fell forward and apparently lost consciousness.  His wife said she thought he was turning blue she denies test for pulse but did begin CPR after calling EMS.  She continued CPR and interrupt until EMS arrived and about 5-6 minutes.  EMS continued CPR.   Patient was reportedly pulseless on EMS arrival and was bleeding from the chest up.  ROSC was achieved after an additional 4 minutes of CPR.  No medications were administered patient was placed on a nonrebreather mask and was taken to [facility] where he required intubation on arrival.  Initial EKG revealed atrial fibrillation with rate around 80 bpm and he was hypotensive.  High-sensitivity troponin mildly elevated at 21-40-1010-116 consistent with demand ischemia.  BNP was 513.6.  WBCs of 18.2, hyperkalemia 5.7 and AKI with creatinine 2.01.  Imaging including head, chest, abdomen, and pelvic CTs that did not show any acute findings other than acute rib fractures from CPR.  He was admitted to ICU and cardiology was consulted for further evaluation.  He was started on IV amiodarone, home CHF medications were held and he was started on IV heparin and lieu of apixaban.  He was able to be extubated on 05/19/2023.  Echocardiogram showed an LVEF of 40-45% with inferior lateral wall hypokinesis with G1 DD.  He was briefly diuresed with IV Lasix and also treated for possible aspiration pneumonia.  He underwent a successful DCCV with return of sinus rhythm on 05/22/2023.  EP was consulted was felt that VT cannot be excluded from the cause of cardiac arrest.  Cardiac MRI was performed on 05/24/2023 which showed a moderately reduced LVEF of 38% with lateral wall LV basal-mid subendocardial scar/LGE.  LGE/scar accounting for 12.77 g (11%) of total myocardial mass.  Findings were consistent with prior LV infarct.  He was transferred to [facility] for further EP  study.  EP study was completed on 05/26/2023 which showed injectable sustained monomorphic VT.  Given inability to safely pause anticoagulation given his recent cardioversion LifeVest was placed with plans to bring him back for an ICD after 4 weeks of uninterrupted anticoagulation.  He was discharged on p.o. amiodarone taper, apixaban 5 mg twice daily and Toprol-XL 12.5 mg daily.   No aspirin was needed due to full anticoagulation and he was continued on rosuvastatin and ezetimibe.  He had previously not been started on spironolactone or SGLT2 inhibitor in the past due to renal function.  He was considered stable for discharge and discharged home from the facility on 05/27/2023.  He resolved back to the facility for the implantation of a dual-chamber ICD on 07/03/2023 by Dr. Lalla Brothers.  There were no immediate postprocedure complications.  CXR on 07/04/23 demonstrated no pneumothorax status post device implantation.  He was considered stable for discharge on 07/04/2023.  At that time he was advised to be no driving for 6 months from the date and initial visit on May 18, 2023.  At that time patient was aware.  He returns to clinic today accompanied by his wife.  Overall from a cardiac perspective he has been doing well.  His device was implanted 07/03/2023.  He stated he has not done well since that time.  He did follow-up for wound check which was healing well without incident.  Device check is scheduled coming up in November which is 91 days postimplantation.  He denies any chest pain, shortness of breath, peripheral edema, or lightheadedness/dizziness.  He states that he has been compliant with his current medication regimen.  He has not missed any of his apixaban and denies having any blood noted in his stool or urine.  He also requires a cardiac evaluation today is that he can have basic dental cleaning completed at the dentist office.  He denies any hospitalizations or visits to the emergency department.  He also has followed up with nephrology and his PCP.  ROS: 10 point review of system has been reviewed and considered negative with exception was been listed in the HPI  Studies Reviewed: Marland Kitchen   EKG Interpretation Date/Time:  Wednesday September 06 2023 10:33:25 EDT Ventricular Rate:  79 PR Interval:  110 QRS Duration:  116 QT Interval:  400 QTC Calculation: 458 R Axis:   -64  Text  Interpretation: Sinus rhythm with short PR Left anterior fascicular block Minimal voltage criteria for LVH, may be normal variant ( Cornell product ) ST & T wave abnormality, consider lateral ischemia When compared with ECG of 04-Jul-2023 10:37, Nonspecific T wave abnormality, improved in Inferior leads Confirmed by Charlsie Quest ([id number]) on 09/06/2023 10:39:42 AM    Echocardiogram 05/19/2023: Impressions: 1. Left ventricular ejection fraction, by estimation, is 40 to 45%. The  left ventricle has mildly decreased function. The left ventricle  demonstrates regional wall motion abnormalities (inferolateral wall  hypokinesis). There is moderate asymmetric left  ventricular hypertrophy of the septal segment. Left ventricular diastolic  parameters are consistent with Grade I diastolic dysfunction (impaired  relaxation).   2. Right ventricular systolic function is normal. The right ventricular  size is normal. There is normal pulmonary artery systolic pressure. The  estimated right ventricular systolic pressure is 29.0 mmHg.   3. The mitral valve is normal in structure. No evidence of mitral valve  regurgitation. No evidence of mitral stenosis.   4. Tricuspid valve regurgitation is moderate.   5. The aortic valve is normal in  structure. Aortic valve regurgitation is  not visualized. No aortic stenosis is present.   6. There is borderline dilatation of the aortic root, measuring 40 mm.   7. The inferior vena cava is normal in size with greater than 50%  respiratory variability, suggesting right atrial pressure of 3 mmHg.  _____________   Cardiac MRI 05/23/2023: Impressions: 1.  Moderately reduced LV systolic function.  LVEF = 38% 2.  Lateral wall LV Basal-mid subendocardial scar/LGE. 3.  LGE/scar accounts for 12.77g (11%) of total myocardial mass. 4.  Normal RV function. 5.  Findings consistent with prior LV infarct. 6.  Ischemic cardiomyopathy. _______________   EP Study  05/26/2023: Conclusions: 1. Sinus rhythm upon presentation.  2. Sustained, monomorphic VT inducible at EP study 3. AV conduction appears nodal. 4. No early apparent complications.  5. Start Amiodarone 6. Plan for outpatient ICD implant. Will bridge with a LifeVest until he can return for ICD implant. Risk Assessment/Calculations:    CHA2DS2-VASc Score = 4   This indicates a 4.8% annual risk of stroke. The patient's score is based upon: CHF History: 1 HTN History: 1 Diabetes History: 0 Stroke History: 0 Vascular Disease History: 1 Age Score: 1 Gender Score: 0            Physical Exam:   VS:  BP 96/70 (BP Location: Left Arm, Patient Position: Sitting, Cuff Size: Normal)   Pulse 79   Ht 5\' 11"  (1.803 m)   Wt 204 lb 12.8 oz (92.9 kg)   SpO2 99%   BMI 28.56 kg/m    Wt Readings from Last 3 Encounters:  09/06/23 204 lb 12.8 oz (92.9 kg)  08/11/23 205 lb (93 kg)  07/18/23 202 lb 3.2 oz (91.7 kg)    GEN: Well nourished, well developed in no acute distress NECK: No JVD; No carotid bruits CARDIAC: RRR, no murmurs, rubs, gallops RESPIRATORY:  Clear to auscultation without rales, wheezing or rhonchi  ABDOMEN: Soft, non-tender, non-distended EXTREMITIES:  No edema; No deformity   ASSESSMENT AND PLAN: .   Cardiac arrest with monomorphic VT. had bystander CPR by his wife when EMS arrived they continued CPR and ROSC was achieved.  Cardiac MRI showed an LVEF of 30% with lateral wall LV basal/mid subendocardial scar/LGE.  LA/scar catheter 11% of total myocardial mass.  Findings were consistent with prior LV infarct.  He was transferred to [facility] and underwent EP study which showed inducible sustained monomorphic ventricular tachycardia.  LifeVest was placed and he had ICD implanted and was loaded on p.o. amiodarone and continued on apixaban 5 mg twice daily restarted on 07/09/2023.  Atrial fibrillation with RVR with a history of PAF.  EKG today reveals sinus rhythm with a rate of 79  with a left anterior fascicular block, RVH and nonspecific T wave abnormalities.  He is continued on amiodarone 200 mg daily, and apixaban 5 mg twice daily for CHA2DS2-VASc of at least 4 for stroke prophylaxis.  He denies any bleeding or noted blood in his stool.  Recently had blood work completed with a hemoglobin of 13.9.  Coronary artery disease status post CABG with remote CABG x 4 in 2023.  Cardiac MRI showed findings consistent with prior LV infarct s/p left lateral wall LV basal mid subendocardial scar/LGE.  He denies any chest pain or anginal equivalents today.  He is continued on apixaban and lieu of aspirin, beta-blocker therapy, rosuvastatin and ezetimibe.  EKG today reveals no significant changes.  Chronic HFrEF/ischemic cardiomyopathy with last echocardiogram revealed LVEF  of 40-45% with inferior wall hypokinesis, moderate asymmetric LVH of the septal segment and G1 DD.  He did receive IV diuresis.  Home Entresto and Toprol were restarted prior to discharge.  He has not been on spironolactone or SGLT2 inhibitor due to renal function.  Serum creatinine remains elevated and he just recently had follow-up with nephrology where serum creatinine is staying on average 1.53.  He is continued on Toprol-XL 12.5 mg daily, Entresto 24/26 mg twice dail. there were no changes made to his medications today.  He appears to be euvolemic on exam.  Denies any shortness of breath or swelling to his bilateral lower extremities.  He has been encouraged to continue to weigh himself daily, limit sodium intake, and limit excess fluids.  Mixed hyperlipidemia with an LDL 63 on 06/12/23 goal of less than 70 ideally 55 continued on rosuvastatin and ezetimibe.  Encouraged to avoid fast, fried, fatty foods.  Triglycerides were slightly elevated on last lipid panel.  Hypothyroidism where he is continued on levothyroxine 125 mcg daily.  This continues to be followed by his PCP.  CKD stage III with baseline serum creatinine  1.53 which has remained about eh same on the last two blood draws.  He has been encouraged to maintain his hydration, avoid NSAID's/nephrotoxic agents.  Continue to follow-up with nephrology.  Preoperative cardiovascular exam concordant ACC/AHA guidelines no further cardiovascular testing is needed.  The patient may proceed with his teeth cleaning.  He has been advised that he does not need to stop his apixaban for routine dental cleaning.  He already takes prophylactic antibiotic therapy for his knee replacement prior to dental work no further medications are required.  He has been advised to due to his ICD that he has not had open deep scaling or further invasive dental procedures other than routine cleaning.    Mr. Obas perioperative risk of a major cardiac event is 6.6% according to the Revised Cardiac Risk Index (RCRI).  Therefore, he is at low risk for perioperative complications.   His functional capacity is fair at 6.27 METs according to the [facility] Activity Status Index (DASI). Recommendations: According to ACC/AHA guidelines, no further cardiovascular testing needed.  The patient may proceed to surgery at acceptable risk.         Dispo: Patient to return to clinic to see MD/APP in 6 months or sooner if needed.  Signed, Luma Clopper, NP

## 2023-09-07 ENCOUNTER — Other Ambulatory Visit: Payer: Self-pay | Admitting: *Deleted

## 2023-09-07 MED ORDER — ENTRESTO 24-26 MG PO TABS: 1.0000 | ORAL_TABLET | Freq: Two times a day (BID) | ORAL | 3 refills | Status: DC

## 2023-09-08 ENCOUNTER — Other Ambulatory Visit: Payer: Medicare HMO

## 2023-09-08 DIAGNOSIS — E039 Hypothyroidism, unspecified: Secondary | ICD-10-CM

## 2023-09-09 ENCOUNTER — Other Ambulatory Visit: Payer: Self-pay | Admitting: Nurse Practitioner

## 2023-09-09 DIAGNOSIS — E039 Hypothyroidism, unspecified: Secondary | ICD-10-CM

## 2023-09-09 LAB — T4, FREE: Free T4: 1.52 ng/dL (ref 0.82–1.77)

## 2023-09-09 LAB — TSH: TSH: 6.23 u[IU]/mL — ABNORMAL HIGH (ref 0.450–4.500)

## 2023-09-09 MED ORDER — LEVOTHYROXINE SODIUM 150 MCG PO TABS: 150.0000 ug | ORAL_TABLET | Freq: Every day | ORAL | 3 refills | Status: DC

## 2023-09-09 NOTE — Progress Notes (Signed)
Contacted via MyChart -- however we are making medication changes so please call and ensure he has seen this change and also needs lab visit only in 6 weeks.  Thank you:)  Good morning Stuart Harvey, your labs have returned and TSH remains above goal, Free T4 stable.  I suspect it will take a bit more to get TSH in normal range since you are on the Amiodarone.  I will increase your Levothyroxine from 125 MCG every morning to 150 MCG.  Please ensure you are taking this every morning 30 minutes prior to eating or taking any other medication.  We will recheck levels in 6 weeks outpatient again.  Any questions? Keep being stellar!!  Thank you for allowing me to participate in your care.  I appreciate you. Kindest regards, Avarose Mervine

## 2023-09-11 ENCOUNTER — Encounter: Payer: Self-pay | Admitting: Nurse Practitioner

## 2023-09-12 NOTE — Telephone Encounter (Signed)
Re faxed to correct provider at [phone]

## 2023-09-22 ENCOUNTER — Other Ambulatory Visit (HOSPITAL_COMMUNITY): Payer: Self-pay

## 2023-09-22 NOTE — Telephone Encounter (Signed)
PAP: Application for Eliquis has been submitted to PAP Companies: [employer], via fax

## 2023-09-23 ENCOUNTER — Encounter: Payer: Self-pay | Admitting: Nurse Practitioner

## 2023-09-25 ENCOUNTER — Telehealth: Payer: Self-pay | Admitting: Nurse Practitioner

## 2023-09-25 ENCOUNTER — Other Ambulatory Visit: Payer: Medicare HMO

## 2023-09-25 NOTE — Telephone Encounter (Signed)
Faxed over office note where pt was cleared to   [phone]

## 2023-09-25 NOTE — Telephone Encounter (Signed)
   Victorino Dike from [phone]  calling, she said, they did not receive clearance, she requested if it can be refax to them today Fax number:  [phone]

## 2023-09-25 NOTE — Telephone Encounter (Signed)
Pt is calling in requesting that Jolene's nurse give him a call. Pt didn't specify the reason. Please follow up with pt.

## 2023-09-26 ENCOUNTER — Ambulatory Visit (INDEPENDENT_AMBULATORY_CARE_PROVIDER_SITE_OTHER): Payer: Medicare HMO | Admitting: Nurse Practitioner

## 2023-09-26 ENCOUNTER — Encounter: Payer: Self-pay | Admitting: Nurse Practitioner

## 2023-09-26 VITALS — BP 96/64 | HR 77 | Temp 97.4°F | Ht 71.0 in | Wt 205.6 lb

## 2023-09-26 DIAGNOSIS — F324 Major depressive disorder, single episode, in partial remission: Secondary | ICD-10-CM | POA: Diagnosis not present

## 2023-09-26 DIAGNOSIS — F419 Anxiety disorder, unspecified: Secondary | ICD-10-CM | POA: Insufficient documentation

## 2023-09-26 MED ORDER — BUSPIRONE HCL 10 MG PO TABS: 20.0000 mg | ORAL_TABLET | Freq: Two times a day (BID) | ORAL | 1 refills | Status: DC

## 2023-09-26 NOTE — Assessment & Plan Note (Signed)
Chronic, ongoing.  Denies SI/HI.  Some worsening since having to stop Celexa due to interaction with Amiodarone.  He is aware due to interactions with this medication it reduces what we can use.  Continue Wellbutrin at this time and increase Buspar to 20 MG BID.  Can go to max 60 MG total daily.  Discussed with wife and him today.  Could also consider low dose Belsomra in future if needed to help more with sleep, which in turn may help anxiety or even therapy.

## 2023-09-26 NOTE — Progress Notes (Signed)
BP 96/64   Pulse 77   Temp (!) 97.4 F (36.3 C) (Oral)   Ht 5\' 11"  (1.803 m)   Wt 205 lb 9.6 oz (93.3 kg)   SpO2 97%   BMI 28.68 kg/m    Subjective:    Patient ID: Stuart Harvey, male    DOB: [birth year], 72 y.o.   MRN: [id number]  HPI: Stuart Harvey is a 72 y.o. male  Chief Complaint  Patient presents with   Anxiety    Patient states he was switched from Citalopram to Buspirone last month and feels that the Buspirone is not helping as much as the citalopram was with his anxiety.    ANXIETY/STRESS Had to stop Celexa due to interactions with amiodarone which he was started on a month back for heart.  Currently taking Wellbutrin and Buspar.  Having more anxiety, excessive worry and claustrophobia.  Anxiety is worse at night (claustrophobia) and when first wakes up. Duration:uncontrolled Anxious mood: yes  Excessive worrying: yes Irritability: yes  Sweating: no Nausea: no Palpitations:no Hyperventilation: no Panic attacks: no Agoraphobia: no  Obscessions/compulsions: has had before Depressed mood: occasional    10/23/2023    3:59 PM 08/11/2023    3:17 PM 06/12/2023    1:13 PM 02/28/2023   10:34 AM 01/11/2023   10:36 AM  Depression screen PHQ 2/9  Decreased Interest 0 0 0 1 0  Down, Depressed, Hopeless 1 0 1 0 0  PHQ - 2 Score 1 0 1 1 0  Altered sleeping 2 1 1 1 1   Tired, decreased energy 2 1 0 1 1  Change in appetite 1 0 0 0 0  Feeling bad or failure about yourself  0 0 0 0 0  Trouble concentrating 1 0 0 1 0  Moving slowly or fidgety/restless 1 0 0 0 0  Suicidal thoughts 0 0 0 0 0  PHQ-9 Score 8 2 2 4 2   Difficult doing work/chores Somewhat difficult Not difficult at all  Not difficult at all Not difficult at all  Anhedonia: no Weight changes: no Insomnia: yes hard to fall asleep  Hypersomnia: no Fatigue/loss of energy: yes Feelings of worthlessness: yes Feelings of guilt: yes Impaired concentration/indecisiveness: no Suicidal ideations: no  Crying  spells: no Recent Stressors/Life Changes: no   Relationship problems: no   Family stress: no     Financial stress: no    Job stress: no    Recent death/loss: no    10-23-2023    4:00 PM 08/11/2023    3:18 PM 06/12/2023    1:14 PM 01/11/2023   10:36 AM  GAD 7 : Generalized Anxiety Score  Nervous, Anxious, on Edge 1 0 0 0  Control/stop worrying 1 1 0 0  Worry too much - different things 1 1 1  0  Trouble relaxing 1 0 0 0  Restless 1 0 0 0  Easily annoyed or irritable 1 1 0 0  Afraid - awful might happen 2 1 0 0  Total GAD 7 Score 8 4 1  0  Anxiety Difficulty Somewhat difficult Somewhat difficult  Not difficult at all    Relevant past medical, surgical, family and social history reviewed and updated as indicated. Interim medical history since our last visit reviewed. Allergies and medications reviewed and updated.  Review of Systems  Constitutional:  Negative for activity change, diaphoresis, fatigue and fever.  Respiratory:  Negative for cough, chest tightness, shortness of breath and wheezing.   Cardiovascular:  Negative for chest  pain, palpitations and leg swelling.  Gastrointestinal: Negative.   Endocrine: Negative for cold intolerance, heat intolerance, polydipsia, polyphagia and polyuria.  Neurological: Negative.   Psychiatric/Behavioral: Negative.      Per HPI unless specifically indicated above     Objective:    BP 96/64   Pulse 77   Temp (!) 97.4 F (36.3 C) (Oral)   Ht 5\' 11"  (1.803 m)   Wt 205 lb 9.6 oz (93.3 kg)   SpO2 97%   BMI 28.68 kg/m   Wt Readings from Last 3 Encounters:  09/26/23 205 lb 9.6 oz (93.3 kg)  09/06/23 204 lb 12.8 oz (92.9 kg)  08/11/23 205 lb (93 kg)    Physical Exam Vitals and nursing note reviewed.  Constitutional:      General: He is awake. He is not in acute distress.    Appearance: He is well-developed and well-groomed. He is not ill-appearing or toxic-appearing.  HENT:     Head: Normocephalic.     Right Ear: Hearing and  external ear normal.     Left Ear: Hearing and external ear normal.  Eyes:     General: Lids are normal.     Extraocular Movements: Extraocular movements intact.     Conjunctiva/sclera: Conjunctivae normal.  Neck:     Thyroid: No thyromegaly.     Vascular: No carotid bruit.  Cardiovascular:     Rate and Rhythm: Normal rate and regular rhythm.     Heart sounds: Normal heart sounds. No murmur heard.    No gallop.  Pulmonary:     Effort: No accessory muscle usage or respiratory distress.     Breath sounds: Normal breath sounds.  Abdominal:     General: Bowel sounds are normal. There is no distension.     Palpations: Abdomen is soft.     Tenderness: There is no abdominal tenderness.  Musculoskeletal:     Cervical back: Full passive range of motion without pain.     Right lower leg: No edema.     Left lower leg: No edema.  Lymphadenopathy:     Cervical: No cervical adenopathy.  Skin:    General: Skin is warm.     Capillary Refill: Capillary refill takes less than 2 seconds.  Neurological:     Mental Status: He is alert and oriented to person, place, and time.     Deep Tendon Reflexes: Reflexes are normal and symmetric.     Reflex Scores:      Brachioradialis reflexes are 2+ on the right side and 2+ on the left side.      Patellar reflexes are 2+ on the right side and 2+ on the left side. Psychiatric:        Attention and Perception: Attention normal.        Mood and Affect: Mood normal.        Speech: Speech normal.        Behavior: Behavior normal. Behavior is cooperative.        Thought Content: Thought content normal.    Results for orders placed or performed in visit on 09/08/23  TSH  Result Value Ref Range   TSH 6.230 (H) 0.450 - 4.500 uIU/mL  T4, free  Result Value Ref Range   Free T4 1.52 0.82 - 1.77 ng/dL      Assessment & Plan:   Problem List Items Addressed This Visit       Other   Anxiety    Chronic, ongoing.  Denies SI/HI.  Some worsening since having  to stop Celexa due to interaction with Amiodarone.  He is aware due to interactions with this medication it reduces what we can use.  Continue Wellbutrin at this time and increase Buspar to 20 MG BID.  Can go to max 60 MG total daily.  Discussed with wife and him today.  Could also consider low dose Belsomra in future if needed to help more with sleep, which in turn may help anxiety or even therapy.      Relevant Medications   busPIRone (BUSPAR) 10 MG tablet   Depression - Primary    Chronic, stable.  Can not take multiple medications for mood due to Amiodarone.  Continue Wellbutrin as ordered and increase Buspar 20 MG BID.  Denies SI/HI.  Monitor Wellbutrin and Toprol XL use + Celexa and Omeprazole -- discussed may need to consider mood medication adjustments in future.       Relevant Medications   busPIRone (BUSPAR) 10 MG tablet     Follow up plan: Return in about 4 weeks (around 10/24/2023) for ANXIETY.

## 2023-09-26 NOTE — Assessment & Plan Note (Signed)
Chronic, stable.  Can not take multiple medications for mood due to Amiodarone.  Continue Wellbutrin as ordered and increase Buspar 20 MG BID.  Denies SI/HI.  Monitor Wellbutrin and Toprol XL use + Celexa and Omeprazole -- discussed may need to consider mood medication adjustments in future.

## 2023-09-26 NOTE — Patient Instructions (Signed)
 Managing Anxiety, Adult  After being diagnosed with anxiety, you may be relieved to know why you have felt or behaved a certain way. You may also feel overwhelmed about the treatment ahead and what it will mean for your life. With care and support, you can manage your anxiety.  How to manage lifestyle changes  Understanding the difference between stress and anxiety  Although stress can play a role in anxiety, it is not the same as anxiety. Stress is your body's reaction to life changes and events, both good and bad. Stress is often caused by something external, such as a deadline, test, or competition. It normally goes away after the event has ended and will last just a few hours. But, stress can be ongoing and can lead to more than just stress.  Anxiety is caused by something internal, such as imagining a terrible outcome or worrying that something will go wrong that will greatly upset you. Anxiety often does not go away even after the event is over, and it can become a long-term (chronic) worry.  Lowering stress and anxiety    Talk with your health care provider or a counselor to learn more about lowering anxiety and stress. They may suggest tension-reduction techniques, such as:  Music. Spend time creating or listening to music that you enjoy and that inspires you.  Mindfulness-based meditation. Practice being aware of your normal breaths while not trying to control your breathing. It can be done while sitting or walking.  Centering prayer. Focus on a word, phrase, or sacred image that means something to you and brings you peace.  Deep breathing. Expand your stomach and inhale slowly through your nose. Hold your breath for 3-5 seconds. Then breathe out slowly, letting your stomach muscles relax.  Self-talk. Learn to notice and spot thought patterns that lead to anxiety reactions. Change those patterns to thoughts that feel peaceful.  Muscle relaxation. Take time to tense muscles and then relax them.  Choose a  tension-reduction technique that fits your lifestyle and personality. These techniques take time and practice. Set aside 5-15 minutes a day to do them. Specialized therapists can offer counseling and training in these techniques. The training to help with anxiety may be covered by some insurance plans.  Other things you can do to manage stress and anxiety include:  Keeping a stress diary. This can help you learn what triggers your reaction and then learn ways to manage your response.  Thinking about how you react to certain situations. You may not be able to control everything, but you can control your response.  Making time for activities that help you relax and not feeling guilty about spending your time in this way.  Doing visual imagery. This involves imagining or creating mental pictures to help you relax.  Practicing yoga. Through yoga poses, you can lower tension and relax.     Medicines  Medicines for anxiety include:  Antidepressant medicines. These are usually prescribed for long-term daily control.  Anti-anxiety medicines. These may be added in severe cases, especially when panic attacks occur.  When used together, medicines, psychotherapy, and tension-reduction techniques may be the most effective treatment.  Relationships  Relationships can play a big part in helping you recover. Spend more time connecting with trusted friends and family members. Think about going to couples counseling if you have a partner, taking family education classes, or going to family therapy. Therapy can help you and others better understand your anxiety.  How to recognize changes in  your anxiety  Everyone responds differently to treatment for anxiety. Recovery from anxiety happens when symptoms lessen and stop interfering with your daily life at home or work. This may mean that you will start to:  Have better concentration and focus. Worry will interfere less in your daily thinking.  Sleep better.  Be less irritable.  Have  more energy.  Have improved memory.  Try to recognize when your condition is getting worse. Contact your provider if your symptoms interfere with home or work and you feel like your condition is not improving.  Follow these instructions at home:  Activity  Exercise. Adults should:  Exercise for at least 150 minutes each week. The exercise should increase your heart rate and make you sweat (moderate-intensity exercise).  Do strengthening exercises at least twice a week.  Get the right amount and quality of sleep. Most adults need 7-9 hours of sleep each night.  Lifestyle    Eat a healthy diet that includes plenty of vegetables, fruits, whole grains, low-fat dairy products, and lean protein.  Do not eat a lot of foods that are high in fats, added sugars, or salt (sodium).  Make choices that simplify your life.  Do not use any products that contain nicotine or tobacco. These products include cigarettes, chewing tobacco, and vaping devices, such as e-cigarettes. If you need help quitting, ask your provider.  Avoid caffeine, alcohol, and certain over-the-counter cold medicines. These may make you feel worse. Ask your pharmacist which medicines to avoid.  General instructions  Take over-the-counter and prescription medicines only as told by your provider.  Keep all follow-up visits. This is to make sure you are managing your anxiety well or if you need more support.  Where to find support  You can get help and support from:  Self-help groups.  Online and [employer].  A trusted spiritual leader.  Couples counseling.  Family education classes.  Family therapy.  Where to find more information  You may find that joining a support group helps you deal with your anxiety. The following sources can help you find counselors or support groups near you:  Mental Health America: mentalhealthamerica.net  Anxiety and Depression Association of [place] (ADAA): adaa.org  [employer] Mental Illness (NAMI):  nami.org  Contact a health care provider if:  You have a hard time staying focused or finishing tasks.  You spend many hours a day feeling worried about everyday life.  You are very tired because you cannot stop worrying.  You start to have headaches or often feel tense.  You have chronic nausea or diarrhea.  Get help right away if:  Your heart feels like it is racing.  You have shortness of breath.  You have thoughts of hurting yourself or others.  Get help right away if you feel like you may hurt yourself or others, or have thoughts about taking your own life. Go to your nearest emergency room or:  Call 911.  Call the National Suicide Prevention Lifeline at 417-380-0019 or 988. This is open 24 hours a day.  Text the Crisis Text Line at 4151481703.  This information is not intended to replace advice given to you by your health care provider. Make sure you discuss any questions you have with your health care provider.  Document Revised: 08/09/2022 Document Reviewed: 02/21/2021  Elsevier Patient Education  2024 [employer].

## 2023-09-27 ENCOUNTER — Encounter: Payer: Medicare HMO | Admitting: Cardiology

## 2023-09-27 ENCOUNTER — Telehealth: Payer: Self-pay | Admitting: Nurse Practitioner

## 2023-09-27 NOTE — Telephone Encounter (Signed)
Discussed patient's concerns at appointment yesterday with provider.

## 2023-09-27 NOTE — Telephone Encounter (Signed)
Copied from CRM [id number]. Topic: General - Inquiry >> Sep 27, 2023  2:44 PM De Blanch wrote: Reason for CRM: Pt stated Sabino Niemann Pharmacist is helping him with medication assistance. Pt stated since he submitted the application he has not heard back is calling for an update.  Please advise.

## 2023-09-28 NOTE — Telephone Encounter (Signed)
PAP: Patient has been denied for pt assistance by PAP Companies: [employer] due to patient above income threshold for program  Denial attached to pt's media

## 2023-10-02 NOTE — Progress Notes (Signed)
   10/02/2023  Patient ID: Stuart Harvey, male   DOB: August 25, 1951, 72 y.o.   MRN: [id number]  Patient denied patient assistance program for Eliquis based on household income.  I sent a message to [name] via Mychart to verify he still has medication on hand; it appears a 30 day prescription was filled 11/11.  I also inquired about need for a sample if we could obtain one to get him through until the first of the year when he will be out of his coverage gap.  Sending PCP a message to make her aware, also.  Lenna Gilford, PharmD, DPLA

## 2023-10-03 ENCOUNTER — Ambulatory Visit (INDEPENDENT_AMBULATORY_CARE_PROVIDER_SITE_OTHER): Payer: Medicare HMO

## 2023-10-03 DIAGNOSIS — I472 Ventricular tachycardia, unspecified: Secondary | ICD-10-CM | POA: Diagnosis not present

## 2023-10-03 LAB — CUP PACEART REMOTE DEVICE CHECK
Battery Remaining Longevity: 151 mo
Battery Voltage: 3.11 V
Brady Statistic AP VP Percent: 0 %
Brady Statistic AP VS Percent: 4.62 %
Brady Statistic AS VP Percent: 0.02 %
Brady Statistic AS VS Percent: 95.35 %
Brady Statistic RA Percent Paced: 4.63 %
Brady Statistic RV Percent Paced: 0.02 %
Date Time Interrogation Session: 20241118212030
HighPow Impedance: 60 Ohm
Implantable Lead Connection Status: 753985
Implantable Lead Connection Status: 753985
Implantable Lead Implant Date: 20240819
Implantable Lead Implant Date: 20240819
Implantable Lead Location: 753859
Implantable Lead Location: 753860
Implantable Lead Model: 5076
Implantable Pulse Generator Implant Date: 20240819
Lead Channel Impedance Value: 380 Ohm
Lead Channel Impedance Value: 475 Ohm
Lead Channel Impedance Value: 513 Ohm
Lead Channel Pacing Threshold Amplitude: 0.625 V
Lead Channel Pacing Threshold Amplitude: 0.875 V
Lead Channel Pacing Threshold Pulse Width: 0.4 ms
Lead Channel Pacing Threshold Pulse Width: 0.4 ms
Lead Channel Sensing Intrinsic Amplitude: 1.5 mV
Lead Channel Sensing Intrinsic Amplitude: 14.5 mV
Lead Channel Setting Pacing Amplitude: 1.5 V
Lead Channel Setting Pacing Amplitude: 3.5 V
Lead Channel Setting Pacing Pulse Width: 0.4 ms
Lead Channel Setting Sensing Sensitivity: 0.3 mV
Zone Setting Status: 755011
Zone Setting Status: 755011

## 2023-10-03 NOTE — Progress Notes (Signed)
   10/03/2023  Patient ID: Stuart Harvey, male   DOB: 07/11/1951, 72 y.o.   MRN: [id number]  Patient denied PAP through [employer] for Eliquis based on [facility].  Contacting provider with cardiology Eula Listen) to see if office may have any samples patient could get to help get through EOY while in the coverage gap.  Lenna Gilford, PharmD, DPLA

## 2023-10-04 ENCOUNTER — Encounter: Payer: Medicare HMO | Admitting: Cardiology

## 2023-10-04 ENCOUNTER — Other Ambulatory Visit: Payer: Self-pay | Admitting: Emergency Medicine

## 2023-10-04 MED ORDER — APIXABAN 5 MG PO TABS: 5.0000 mg | ORAL_TABLET | Freq: Two times a day (BID) | ORAL | Status: DC

## 2023-10-05 ENCOUNTER — Telehealth: Payer: Self-pay | Admitting: Emergency Medicine

## 2023-10-05 NOTE — Progress Notes (Signed)
   10/05/2023  Patient ID: Stuart Harvey, male   DOB: 09-22-1951, 72 y.o.   MRN: [id number]  Cardiology has samples patient can pick up of Eliquis.  This will only last two weeks; but the office states he can reach out again in two weeks, as they are likely to have more available. Sent MyChart message to make patient aware.  Lenna Gilford, PharmD, DPLA

## 2023-10-05 NOTE — Telephone Encounter (Signed)
Pt has 14 day supply of meds in sample room

## 2023-10-10 NOTE — Progress Notes (Unsigned)
Electrophysiology Office Follow up Visit Note:    Date:  10/11/2023   ID:  Stuart Harvey, DOB [birth year], MRN [id number]  PCP:  Marjie Skiff, NP  [facility] Cardiologist:  [name], MD  [facility] Electrophysiologist:  Lanier Prude, MD    Interval History:     Stuart Harvey is a 72 y.o. male who presents for a follow up visit.   He had an ICD implanted July 03, 2023.  Discussed the use of AI scribe software for clinical note transcription with the patient, who gave verbal consent to proceed.  History of Present Illness   [name], a patient with a history of a syncope and VT requiring the placement of an ICD and treatment with amiodarone, presents for a routine check-up. The patient reports no episodes of dangerous fast heart rates and has been staying active. However, the patient has been experiencing difficulty sleeping and has started taking a supplement called Relaxium, which he reports has been helpful. The patient also expresses a desire to resume driving, which has been restricted due to his cardiac condition. The patient is aware of the six-month restriction period and is hopeful to resume driving part-time after this period, provided there are no shocks from the ICD.            Past medical, surgical, social and family history were reviewed.  ROS:   Please see the history of present illness.    All other systems reviewed and are negative.  EKGs/Labs/Other Studies Reviewed:    The following studies were reviewed today:  October 11, 2023 in clinic device interrogation personally reviewed Dual chamber ICD in place Lead parameters stable Longevity 12.6 years No hv therapies.          Physical Exam:    VS:  BP 120/78 (BP Location: Left Arm, Patient Position: Sitting, Cuff Size: Large)   Pulse 80   Ht 5\' 11"  (1.803 m)   Wt 205 lb 3.2 oz (93.1 kg)   SpO2 98%   BMI 28.62 kg/m     Wt Readings from Last 3 Encounters:  10/11/23  205 lb 3.2 oz (93.1 kg)  09/26/23 205 lb 9.6 oz (93.3 kg)  09/06/23 204 lb 12.8 oz (92.9 kg)     Physical Exam   SKIN: Incision site appears well-healed.          ASSESSMENT:    1. Paroxysmal atrial fibrillation (HCC)   2. HFrEF (heart failure with reduced ejection fraction) (HCC)   3. Encounter for long-term (current) use of high-risk medication    PLAN:    In order of problems listed above:  Assessment and Plan    Implantable Cardioverter Defibrillator (ICD) No episodes of dangerous fast heart rate. No shocks from the device. Discussed the importance of consistent monitoring and what to do if the device goes off. -Continue current management and monitoring. -If device goes off and patient feels unwell, call 911. -If device goes off and patient feels fine, call office. -If device goes off more than once, go to the hospital.  Driving Restrictions Patient is eager to resume driving. Explained the state regulations requiring a six-month period without any shocks from the device. -Can resume driving on January 4th, 2025, provided no shocks from the device.  Amiodarone No changes needed today. -Order CMP, TSH, and free T4 for amiodarone monitoring today. -Repeat blood work in six months.  Sleep Disturbances Patient reports occasional difficulty sleeping and is using over-the-counter Relaxium Sleep  aid. -Continue current management as it seems to be helping.  Physical Activity Patient is staying active. Discussed the benefits of a formal exercise program such as the [employer] or [employer]. -Encourage patient to consider joining a formal exercise program.  Follow-up -Schedule a six-month follow-up appointment with Rosalita Chessman.               Signed, Steffanie Dunn, MD, [facility], [facility] 10/11/2023 10:36 AM    Electrophysiology [facility]

## 2023-10-11 ENCOUNTER — Ambulatory Visit: Payer: Medicare HMO | Attending: Cardiology | Admitting: Cardiology

## 2023-10-11 ENCOUNTER — Encounter: Payer: Self-pay | Admitting: Cardiology

## 2023-10-11 VITALS — BP 120/78 | HR 80 | Ht 71.0 in | Wt 205.2 lb

## 2023-10-11 DIAGNOSIS — I480 Paroxysmal atrial fibrillation: Secondary | ICD-10-CM

## 2023-10-11 DIAGNOSIS — I5020 Unspecified systolic (congestive) heart failure: Secondary | ICD-10-CM

## 2023-10-11 DIAGNOSIS — Z79899 Other long term (current) drug therapy: Secondary | ICD-10-CM

## 2023-10-11 NOTE — Patient Instructions (Signed)
Medication Instructions:  Your physician recommends that you continue on your current medications as directed. Please refer to the Current Medication list given to you today.  *If you need a refill on your cardiac medications before your next appointment, please call your pharmacy*  Lab Work: TODAY: CMET, TSH, T4  Follow-Up: At [facility], you and your health needs are our priority.  As part of our continuing mission to provide you with exceptional heart care, we have created designated Provider Care Teams.  These Care Teams include your primary Cardiologist (physician) and Advanced Practice Providers (APPs -  Physician Assistants and Nurse Practitioners) who all work together to provide you with the care you need, when you need it.  Your next appointment:   6 months  Provider:   Sherie Don, NP

## 2023-10-12 LAB — T4, FREE: Free T4: 1.95 ng/dL — ABNORMAL HIGH (ref 0.82–1.77)

## 2023-10-12 LAB — COMPREHENSIVE METABOLIC PANEL
ALT: 19 [IU]/L (ref 0–44)
AST: 36 [IU]/L (ref 0–40)
Albumin: 4.1 g/dL (ref 3.8–4.8)
Alkaline Phosphatase: 117 [IU]/L (ref 44–121)
BUN/Creatinine Ratio: 19 (ref 10–24)
BUN: 30 mg/dL — ABNORMAL HIGH (ref 8–27)
Bilirubin Total: 0.5 mg/dL (ref 0.0–1.2)
CO2: 20 mmol/L (ref 20–29)
Calcium: 9.5 mg/dL (ref 8.6–10.2)
Chloride: 107 mmol/L — ABNORMAL HIGH (ref 96–106)
Creatinine, Ser: 1.54 mg/dL — ABNORMAL HIGH (ref 0.76–1.27)
Globulin, Total: 2.2 g/dL (ref 1.5–4.5)
Glucose: 109 mg/dL — ABNORMAL HIGH (ref 70–99)
Potassium: 5 mmol/L (ref 3.5–5.2)
Sodium: 142 mmol/L (ref 134–144)
Total Protein: 6.3 g/dL (ref 6.0–8.5)
eGFR: 48 mL/min/{1.73_m2} — ABNORMAL LOW (ref >59)

## 2023-10-12 LAB — TSH: TSH: 3.64 u[IU]/mL (ref 0.450–4.500)

## 2023-10-19 ENCOUNTER — Other Ambulatory Visit: Payer: Self-pay | Admitting: Nurse Practitioner

## 2023-10-19 MED ORDER — BUSPIRONE HCL 10 MG PO TABS: 20.0000 mg | ORAL_TABLET | Freq: Three times a day (TID) | ORAL | 1 refills | Status: DC

## 2023-10-20 ENCOUNTER — Other Ambulatory Visit: Payer: Medicare HMO

## 2023-10-20 DIAGNOSIS — E039 Hypothyroidism, unspecified: Secondary | ICD-10-CM

## 2023-10-21 LAB — TSH: TSH: 4.18 u[IU]/mL (ref 0.450–4.500)

## 2023-10-21 LAB — T4, FREE: Free T4: 2.03 ng/dL — ABNORMAL HIGH (ref 0.82–1.77)

## 2023-10-21 NOTE — Patient Instructions (Signed)
 Managing Anxiety, Adult  After being diagnosed with anxiety, you may be relieved to know why you have felt or behaved a certain way. You may also feel overwhelmed about the treatment ahead and what it will mean for your life. With care and support, you can manage your anxiety.  How to manage lifestyle changes  Understanding the difference between stress and anxiety  Although stress can play a role in anxiety, it is not the same as anxiety. Stress is your body's reaction to life changes and events, both good and bad. Stress is often caused by something external, such as a deadline, test, or competition. It normally goes away after the event has ended and will last just a few hours. But, stress can be ongoing and can lead to more than just stress.  Anxiety is caused by something internal, such as imagining a terrible outcome or worrying that something will go wrong that will greatly upset you. Anxiety often does not go away even after the event is over, and it can become a long-term (chronic) worry.  Lowering stress and anxiety    Talk with your health care provider or a counselor to learn more about lowering anxiety and stress. They may suggest tension-reduction techniques, such as:  Music. Spend time creating or listening to music that you enjoy and that inspires you.  Mindfulness-based meditation. Practice being aware of your normal breaths while not trying to control your breathing. It can be done while sitting or walking.  Centering prayer. Focus on a word, phrase, or sacred image that means something to you and brings you peace.  Deep breathing. Expand your stomach and inhale slowly through your nose. Hold your breath for 3-5 seconds. Then breathe out slowly, letting your stomach muscles relax.  Self-talk. Learn to notice and spot thought patterns that lead to anxiety reactions. Change those patterns to thoughts that feel peaceful.  Muscle relaxation. Take time to tense muscles and then relax them.  Choose a  tension-reduction technique that fits your lifestyle and personality. These techniques take time and practice. Set aside 5-15 minutes a day to do them. Specialized therapists can offer counseling and training in these techniques. The training to help with anxiety may be covered by some insurance plans.  Other things you can do to manage stress and anxiety include:  Keeping a stress diary. This can help you learn what triggers your reaction and then learn ways to manage your response.  Thinking about how you react to certain situations. You may not be able to control everything, but you can control your response.  Making time for activities that help you relax and not feeling guilty about spending your time in this way.  Doing visual imagery. This involves imagining or creating mental pictures to help you relax.  Practicing yoga. Through yoga poses, you can lower tension and relax.     Medicines  Medicines for anxiety include:  Antidepressant medicines. These are usually prescribed for long-term daily control.  Anti-anxiety medicines. These may be added in severe cases, especially when panic attacks occur.  When used together, medicines, psychotherapy, and tension-reduction techniques may be the most effective treatment.  Relationships  Relationships can play a big part in helping you recover. Spend more time connecting with trusted friends and family members. Think about going to couples counseling if you have a partner, taking family education classes, or going to family therapy. Therapy can help you and others better understand your anxiety.  How to recognize changes in  your anxiety  Everyone responds differently to treatment for anxiety. Recovery from anxiety happens when symptoms lessen and stop interfering with your daily life at home or work. This may mean that you will start to:  Have better concentration and focus. Worry will interfere less in your daily thinking.  Sleep better.  Be less irritable.  Have  more energy.  Have improved memory.  Try to recognize when your condition is getting worse. Contact your provider if your symptoms interfere with home or work and you feel like your condition is not improving.  Follow these instructions at home:  Activity  Exercise. Adults should:  Exercise for at least 150 minutes each week. The exercise should increase your heart rate and make you sweat (moderate-intensity exercise).  Do strengthening exercises at least twice a week.  Get the right amount and quality of sleep. Most adults need 7-9 hours of sleep each night.  Lifestyle    Eat a healthy diet that includes plenty of vegetables, fruits, whole grains, low-fat dairy products, and lean protein.  Do not eat a lot of foods that are high in fats, added sugars, or salt (sodium).  Make choices that simplify your life.  Do not use any products that contain nicotine or tobacco. These products include cigarettes, chewing tobacco, and vaping devices, such as e-cigarettes. If you need help quitting, ask your provider.  Avoid caffeine, alcohol, and certain over-the-counter cold medicines. These may make you feel worse. Ask your pharmacist which medicines to avoid.  General instructions  Take over-the-counter and prescription medicines only as told by your provider.  Keep all follow-up visits. This is to make sure you are managing your anxiety well or if you need more support.  Where to find support  You can get help and support from:  Self-help groups.  Online and [employer].  A trusted spiritual leader.  Couples counseling.  Family education classes.  Family therapy.  Where to find more information  You may find that joining a support group helps you deal with your anxiety. The following sources can help you find counselors or support groups near you:  Mental Health America: mentalhealthamerica.net  Anxiety and Depression Association of [place] (ADAA): adaa.org  [employer] Mental Illness (NAMI):  nami.org  Contact a health care provider if:  You have a hard time staying focused or finishing tasks.  You spend many hours a day feeling worried about everyday life.  You are very tired because you cannot stop worrying.  You start to have headaches or often feel tense.  You have chronic nausea or diarrhea.  Get help right away if:  Your heart feels like it is racing.  You have shortness of breath.  You have thoughts of hurting yourself or others.  Get help right away if you feel like you may hurt yourself or others, or have thoughts about taking your own life. Go to your nearest emergency room or:  Call 911.  Call the National Suicide Prevention Lifeline at 417-380-0019 or 988. This is open 24 hours a day.  Text the Crisis Text Line at 4151481703.  This information is not intended to replace advice given to you by your health care provider. Make sure you discuss any questions you have with your health care provider.  Document Revised: 08/09/2022 Document Reviewed: 02/21/2021  Elsevier Patient Education  2024 [employer].

## 2023-10-21 NOTE — Progress Notes (Signed)
Contacted via MyChart   Good evening Stuart Harvey, your thyroid labs have returned and TSH is normal, Free T4 a bit elevated.  For now continue current Levothyroxine dosing and we will recheck in upcoming weeks.  Any questions? Keep being stellar!!  Thank you for allowing me to participate in your care.  I appreciate you. Kindest regards, Romney Compean

## 2023-10-22 LAB — CUP PACEART INCLINIC DEVICE CHECK
Date Time Interrogation Session: 20241127163631
Implantable Lead Connection Status: 753985
Implantable Lead Connection Status: 753985
Implantable Lead Implant Date: 20240819
Implantable Lead Implant Date: 20240819
Implantable Lead Location: 753859
Implantable Lead Location: 753860
Implantable Lead Model: 5076
Implantable Pulse Generator Implant Date: 20240819

## 2023-10-24 ENCOUNTER — Encounter: Payer: Self-pay | Admitting: Nurse Practitioner

## 2023-10-24 ENCOUNTER — Ambulatory Visit (INDEPENDENT_AMBULATORY_CARE_PROVIDER_SITE_OTHER): Payer: Medicare HMO | Admitting: Nurse Practitioner

## 2023-10-24 VITALS — BP 95/58 | HR 82 | Temp 97.6°F | Wt 202.6 lb

## 2023-10-24 DIAGNOSIS — K5901 Slow transit constipation: Secondary | ICD-10-CM

## 2023-10-24 DIAGNOSIS — F419 Anxiety disorder, unspecified: Secondary | ICD-10-CM | POA: Diagnosis not present

## 2023-10-24 DIAGNOSIS — F324 Major depressive disorder, single episode, in partial remission: Secondary | ICD-10-CM | POA: Diagnosis not present

## 2023-10-24 MED ORDER — VENLAFAXINE HCL ER 37.5 MG PO CP24: 37.5000 mg | ORAL_CAPSULE | Freq: Every day | ORAL | 2 refills | Status: DC

## 2023-10-24 NOTE — Assessment & Plan Note (Signed)
Chronic, ongoing.  Suspect some element of PTSD due to recent traumatic even with dying and being brought back to life.  Can not take multiple medications for mood due to Amiodarone.  Continue Wellbutrin as ordered (max dose) and increase Buspar 20 MG TID (max dose) + add on Effexor 37.5 MG daily.  Denies SI/HI.  Monitor Wellbutrin and Toprol XL use -- discussed may need to consider mood medication adjustments in future.  Will place referral for therapy, this would offer a lot of benefit overall.

## 2023-10-24 NOTE — Assessment & Plan Note (Signed)
For weeks, suspect related to recent traumatic event and anxiety.  His wife reports poor fiber and fluid intake at home.  Start Metamucil daily, may use gummies.  Take Senna or Colace once a day with this, if diarrhea presents then hold this.  Recommend increase water intake and eat fiber rich foods.  Goal is daily BM with no straining.

## 2023-10-24 NOTE — Assessment & Plan Note (Signed)
Chronic, ongoing.  Suspect some element of PTSD due to recent traumatic even with dying and being brought back to life.  Can not take multiple medications for mood due to Amiodarone.  Continue Wellbutrin as ordered (max dose) and increase Buspar 20 MG TID (max dose) + add on Effexor 37.5 MG daily.  Denies SI/HI.  Monitor Wellbutrin and Toprol XL use -- discussed may need to consider mood medication adjustments in future.  Will place referral for therapy, this would offer a lot of benefit overall to work on recent traumatic event and ways of coping with this.

## 2023-10-24 NOTE — Progress Notes (Signed)
BP (!) 95/58   Pulse 82   Temp 97.6 F (36.4 C) (Oral)   Wt 202 lb 9.6 oz (91.9 kg)   SpO2 98%   BMI 28.26 kg/m    Subjective:    Patient ID: Stuart Harvey, male    DOB: [birth year], 72 y.o.   MRN: [id number]  HPI: Stuart Harvey is a 72 y.o. male  Chief Complaint  Patient presents with   Anxiety   Constipation    Patient states he has been having issues with constipation for the last few weeks. States he has taken stool softeners and laxatives with little to no relief.    His wife is at bedside to assist with HPI.   CONSTIPATION Started two months ago. Has not had a good BM in 2 weeks. Is having a BM every 3 to 4 days -- had little one this morning.  These are softer with small amounts.  Does strain to get it out.  Has tried Dulcolax, stool softeners, and suppository.  Has not been getting good fiber in per his wife. Duration:weeks Status: fluctuating Treatments attempted: as above -- has not tried Metamucil or Miralax + not hydrating well or increasing fiber. Fever: no Nausea: no Vomiting: no Weight loss: no Decreased appetite: a little Diarrhea: no Constipation: yes Blood in stool: no Heartburn: no Jaundice: no  ANXIETY/STRESS Currently taking Wellbutrin (at max dose) and Buspar (at max dose). Had to stop Celexa due to interactions with Amiodarone which he was started on a month back for heart.  Having more anxiety, excessive worry and claustrophobia.  Anxiety is worse at night (claustrophobia) and when first wakes up.  Her granddaughter moved out at 46, several months ago, living with her half sister -- granddaughter recently got tattoos and this tore him up.  She is like a daughter to him. Duration:uncontrolled Anxious mood: yes  Excessive worrying: yes Irritability: yes  Sweating: no Nausea: no Palpitations:no Hyperventilation: no Panic attacks: no Agoraphobia: no  Obscessions/compulsions: has had before Depressed mood: occasional    05-Nov-2023     3:08 PM 09/26/2023    3:59 PM 08/11/2023    3:17 PM 06/12/2023    1:13 PM 02/28/2023   10:34 AM  Depression screen PHQ 2/9  Decreased Interest 2 0 0 0 1  Down, Depressed, Hopeless 2 1 0 1 0  PHQ - 2 Score 4 1 0 1 1  Altered sleeping 2 2 1 1 1   Tired, decreased energy 2 2 1  0 1  Change in appetite 1 1 0 0 0  Feeling bad or failure about yourself  0 0 0 0 0  Trouble concentrating 2 1 0 0 1  Moving slowly or fidgety/restless 1 1 0 0 0  Suicidal thoughts 0 0 0 0 0  PHQ-9 Score 12 8 2 2 4   Difficult doing work/chores Somewhat difficult Somewhat difficult Not difficult at all  Not difficult at all  Anhedonia: no Weight changes: no Insomnia: yes hard to fall asleep  Hypersomnia: no Fatigue/loss of energy: yes Feelings of worthlessness: yes Feelings of guilt: yes Impaired concentration/indecisiveness: no Suicidal ideations: no  Crying spells: no Recent Stressors/Life Changes: no   Relationship problems: no   Family stress: no     Financial stress: no    Job stress: no    Recent death/loss: no    2023/11/05    3:08 PM 09/26/2023    4:00 PM 08/11/2023    3:18 PM 06/12/2023    1:14  PM  GAD 7 : Generalized Anxiety Score  Nervous, Anxious, on Edge 2 1 0 0  Control/stop worrying 1 1 1  0  Worry too much - different things 1 1 1 1   Trouble relaxing 1 1 0 0  Restless 1 1 0 0  Easily annoyed or irritable 1 1 1  0  Afraid - awful might happen 1 2 1  0  Total GAD 7 Score 8 8 4 1   Anxiety Difficulty Somewhat difficult Somewhat difficult Somewhat difficult     Relevant past medical, surgical, family and social history reviewed and updated as indicated. Interim medical history since our last visit reviewed. Allergies and medications reviewed and updated.  Review of Systems  Constitutional:  Negative for activity change, diaphoresis, fatigue and fever.  Respiratory:  Negative for cough, chest tightness, shortness of breath and wheezing.   Cardiovascular:  Negative for chest pain,  palpitations and leg swelling.  Gastrointestinal:  Positive for constipation.  Endocrine: Negative for cold intolerance, heat intolerance, polydipsia, polyphagia and polyuria.  Neurological: Negative.   Psychiatric/Behavioral:  Positive for sleep disturbance. Negative for decreased concentration, self-injury and suicidal ideas. The patient is nervous/anxious.     Per HPI unless specifically indicated above     Objective:    BP (!) 95/58   Pulse 82   Temp 97.6 F (36.4 C) (Oral)   Wt 202 lb 9.6 oz (91.9 kg)   SpO2 98%   BMI 28.26 kg/m   Wt Readings from Last 3 Encounters:  10/24/23 202 lb 9.6 oz (91.9 kg)  10/11/23 205 lb 3.2 oz (93.1 kg)  09/26/23 205 lb 9.6 oz (93.3 kg)    Physical Exam Vitals and nursing note reviewed.  Constitutional:      General: He is awake. He is not in acute distress.    Appearance: Normal appearance. He is well-developed and well-groomed. He is not ill-appearing or toxic-appearing.  HENT:     Head: Normocephalic.     Right Ear: Hearing and external ear normal.     Left Ear: Hearing and external ear normal.  Eyes:     General: Lids are normal.     Extraocular Movements: Extraocular movements intact.     Conjunctiva/sclera: Conjunctivae normal.  Neck:     Thyroid: No thyromegaly.     Vascular: No carotid bruit.  Cardiovascular:     Rate and Rhythm: Normal rate and regular rhythm.     Heart sounds: Normal heart sounds. No murmur heard.    No gallop.  Pulmonary:     Effort: No accessory muscle usage or respiratory distress.     Breath sounds: Normal breath sounds.  Abdominal:     General: Bowel sounds are normal. There is no distension.     Palpations: Abdomen is soft. There is no hepatomegaly or mass.     Tenderness: There is no abdominal tenderness. There is no right CVA tenderness or left CVA tenderness.  Musculoskeletal:     Cervical back: Full passive range of motion without pain.     Right lower leg: No edema.     Left lower leg: No  edema.  Lymphadenopathy:     Cervical: No cervical adenopathy.  Skin:    General: Skin is warm.     Capillary Refill: Capillary refill takes less than 2 seconds.  Neurological:     Mental Status: He is alert and oriented to person, place, and time.     Deep Tendon Reflexes: Reflexes are normal and symmetric.  Reflex Scores:      Brachioradialis reflexes are 2+ on the right side and 2+ on the left side.      Patellar reflexes are 2+ on the right side and 2+ on the left side. Psychiatric:        Attention and Perception: Attention normal.        Mood and Affect: Mood normal.        Speech: Speech normal.        Behavior: Behavior normal. Behavior is cooperative.        Thought Content: Thought content normal.    Results for orders placed or performed in visit on 10/20/23  TSH  Result Value Ref Range   TSH 4.180 0.450 - 4.500 uIU/mL  T4, free  Result Value Ref Range   Free T4 2.03 (H) 0.82 - 1.77 ng/dL      Assessment & Plan:   Problem List Items Addressed This Visit       Digestive   Slow transit constipation    For weeks, suspect related to recent traumatic event and anxiety.  His wife reports poor fiber and fluid intake at home.  Start Metamucil daily, may use gummies.  Take Senna or Colace once a day with this, if diarrhea presents then hold this.  Recommend increase water intake and eat fiber rich foods.  Goal is daily BM with no straining.        Other   Anxiety    Chronic, ongoing.  Suspect some element of PTSD due to recent traumatic even with dying and being brought back to life.  Can not take multiple medications for mood due to Amiodarone.  Continue Wellbutrin as ordered (max dose) and increase Buspar 20 MG TID (max dose) + add on Effexor 37.5 MG daily.  Denies SI/HI.  Monitor Wellbutrin and Toprol XL use -- discussed may need to consider mood medication adjustments in future.  Will place referral for therapy, this would offer a lot of benefit overall to work on  recent traumatic event and ways of coping with this.      Relevant Medications   venlafaxine XR (EFFEXOR XR) 37.5 MG 24 hr capsule   Other Relevant Orders   Ambulatory referral to Psychology   Depression - Primary    Chronic, ongoing.  Suspect some element of PTSD due to recent traumatic even with dying and being brought back to life.  Can not take multiple medications for mood due to Amiodarone.  Continue Wellbutrin as ordered (max dose) and increase Buspar 20 MG TID (max dose) + add on Effexor 37.5 MG daily.  Denies SI/HI.  Monitor Wellbutrin and Toprol XL use -- discussed may need to consider mood medication adjustments in future.  Will place referral for therapy, this would offer a lot of benefit overall.      Relevant Medications   venlafaxine XR (EFFEXOR XR) 37.5 MG 24 hr capsule   Other Relevant Orders   Ambulatory referral to Psychology     Time: 30 minutes, >50% spent counseling/or care coordination   Follow up plan: Return in about 3 years (around 10/23/2026) for ANXIETY.

## 2023-10-30 NOTE — Progress Notes (Signed)
Remote ICD transmission.   

## 2023-11-06 ENCOUNTER — Encounter: Payer: Self-pay | Admitting: Nurse Practitioner

## 2023-11-11 NOTE — Patient Instructions (Signed)
 Be Involved in Caring For Your Health:  Taking Medications When medications are taken as directed, they can greatly improve your health. But if they are not taken as prescribed, they may not work. In some cases, not taking them correctly can be harmful. To help ensure your treatment remains effective and safe, understand your medications and how to take them. Bring your medications to each visit for review by your provider.  Your lab results, notes, and after visit summary will be available on My Chart. We strongly encourage you to use this feature. If lab results are abnormal the clinic will contact you with the appropriate steps. If the clinic does not contact you assume the results are satisfactory. You can always view your results on My Chart. If you have questions regarding your health or results, please contact the clinic during office hours. You can also ask questions on My Chart.  We at [facility] are grateful that you chose [place] to provide your care. We strive to provide evidence-based and compassionate care and are always looking for feedback. If you get a survey from the clinic please complete this so we can hear your opinions.  Managing Anxiety, Adult After being diagnosed with anxiety, you may be relieved to know why you have felt or behaved a certain way. You may also feel overwhelmed about the treatment ahead and what it will mean for your life. With care and support, you can manage your anxiety. How to manage lifestyle changes Understanding the difference between stress and anxiety Although stress can play a role in anxiety, it is not the same as anxiety. Stress is your body's reaction to life changes and events, both good and bad. Stress is often caused by something external, such as a deadline, test, or competition. It normally goes away after the event has ended and will last just a few hours. But, stress can be ongoing and can lead to more than just stress. Anxiety is  caused by something internal, such as imagining a terrible outcome or worrying that something will go wrong that will greatly upset you. Anxiety often does not go away even after the event is over, and it can become a long-term (chronic) worry. Lowering stress and anxiety Talk with your health care provider or a counselor to learn more about lowering anxiety and stress. They may suggest tension-reduction techniques, such as: Music. Spend time creating or listening to music that you enjoy and that inspires you. Mindfulness-based meditation. Practice being aware of your normal breaths while not trying to control your breathing. It can be done while sitting or walking. Centering prayer. Focus on a word, phrase, or sacred image that means something to you and brings you peace. Deep breathing. Expand your stomach and inhale slowly through your nose. Hold your breath for 3-5 seconds. Then breathe out slowly, letting your stomach muscles relax. Self-talk. Learn to notice and spot thought patterns that lead to anxiety reactions. Change those patterns to thoughts that feel peaceful. Muscle relaxation. Take time to tense muscles and then relax them. Choose a tension-reduction technique that fits your lifestyle and personality. These techniques take time and practice. Set aside 5-15 minutes a day to do them. Specialized therapists can offer counseling and training in these techniques. The training to help with anxiety may be covered by some insurance plans. Other things you can do to manage stress and anxiety include: Keeping a stress diary. This can help you learn what triggers your reaction and then learn ways  to manage your response. Thinking about how you react to certain situations. You may not be able to control everything, but you can control your response. Making time for activities that help you relax and not feeling guilty about spending your time in this way. Doing visual imagery. This involves  imagining or creating mental pictures to help you relax. Practicing yoga. Through yoga poses, you can lower tension and relax.  Medicines Medicines for anxiety include: Antidepressant medicines. These are usually prescribed for long-term daily control. Anti-anxiety medicines. These may be added in severe cases, especially when panic attacks occur. When used together, medicines, psychotherapy, and tension-reduction techniques may be the most effective treatment. Relationships Relationships can play a big part in helping you recover. Spend more time connecting with trusted friends and family members. Think about going to couples counseling if you have a partner, taking family education classes, or going to family therapy. Therapy can help you and others better understand your anxiety. How to recognize changes in your anxiety Everyone responds differently to treatment for anxiety. Recovery from anxiety happens when symptoms lessen and stop interfering with your daily life at home or work. This may mean that you will start to: Have better concentration and focus. Worry will interfere less in your daily thinking. Sleep better. Be less irritable. Have more energy. Have improved memory. Try to recognize when your condition is getting worse. Contact your provider if your symptoms interfere with home or work and you feel like your condition is not improving. Follow these instructions at home: Activity Exercise. Adults should: Exercise for at least 150 minutes each week. The exercise should increase your heart rate and make you sweat (moderate-intensity exercise). Do strengthening exercises at least twice a week. Get the right amount and quality of sleep. Most adults need 7-9 hours of sleep each night. Lifestyle  Eat a healthy diet that includes plenty of vegetables, fruits, whole grains, low-fat dairy products, and lean protein. Do not eat a lot of foods that are high in fats, added sugars, or salt  (sodium). Make choices that simplify your life. Do not use any products that contain nicotine or tobacco. These products include cigarettes, chewing tobacco, and vaping devices, such as e-cigarettes. If you need help quitting, ask your provider. Avoid caffeine, alcohol, and certain over-the-counter cold medicines. These may make you feel worse. Ask your pharmacist which medicines to avoid. General instructions Take over-the-counter and prescription medicines only as told by your provider. Keep all follow-up visits. This is to make sure you are managing your anxiety well or if you need more support. Where to find support You can get help and support from: Self-help groups. Online and [employer]. A trusted spiritual leader. Couples counseling. Family education classes. Family therapy. Where to find more information You may find that joining a support group helps you deal with your anxiety. The following sources can help you find counselors or support groups near you: Mental Health [place]: mentalhealthamerica.net Anxiety and Depression Association of [place] (ADAA): adaa.org [employer] on Mental Illness (NAMI): nami.org Contact a health care provider if: You have a hard time staying focused or finishing tasks. You spend many hours a day feeling worried about everyday life. You are very tired because you cannot stop worrying. You start to have headaches or often feel tense. You have chronic nausea or diarrhea. Get help right away if: Your heart feels like it is racing. You have shortness of breath. You have thoughts of hurting yourself or others. Get help  right away if you feel like you may hurt yourself or others, or have thoughts about taking your own life. Go to your nearest emergency room or: Call 911. Call the National Suicide Prevention Lifeline at 765-482-1593 or 988. This is open 24 hours a day. Text the Crisis Text Line at 504 124 9896. This information is not  intended to replace advice given to you by your health care provider. Make sure you discuss any questions you have with your health care provider. Document Revised: 08/09/2022 Document Reviewed: 02/21/2021 Elsevier Patient Education  2024 [employer].

## 2023-11-14 ENCOUNTER — Ambulatory Visit (INDEPENDENT_AMBULATORY_CARE_PROVIDER_SITE_OTHER): Payer: Medicare HMO | Admitting: Nurse Practitioner

## 2023-11-14 ENCOUNTER — Encounter: Payer: Self-pay | Admitting: Nurse Practitioner

## 2023-11-14 VITALS — BP 97/57 | HR 76 | Temp 97.6°F | Wt 204.4 lb

## 2023-11-14 DIAGNOSIS — F419 Anxiety disorder, unspecified: Secondary | ICD-10-CM

## 2023-11-14 DIAGNOSIS — F324 Major depressive disorder, single episode, in partial remission: Secondary | ICD-10-CM

## 2023-11-14 DIAGNOSIS — K5901 Slow transit constipation: Secondary | ICD-10-CM

## 2023-11-14 NOTE — Assessment & Plan Note (Signed)
 Chronic, improving.  Suspect some element of PTSD due to recent traumatic event with cardiac arrest and being brought back to life.  Can not take multiple medications for mood due to Amiodarone.  Continue Wellbutrin as ordered (max dose), Buspar 20 MG TID (max dose) + Effexor 37.5 MG daily.  Denies SI/HI.  Monitor Wellbutrin and Toprol XL use -- discussed may need to consider mood medication adjustments in future.  Has not attended therapy, is thinking about this.

## 2023-11-14 NOTE — Progress Notes (Signed)
 BP (!) 97/57   Pulse 76   Temp 97.6 F (36.4 C) (Oral)   Wt 204 lb 6.4 oz (92.7 kg)   SpO2 97%   BMI 28.51 kg/m    Subjective:    Patient ID: Stuart Harvey, male    DOB: September 04, 1951, 72 y.o.   MRN: [id number]  HPI: JAHKEEM KURKA is a 72 y.o. male  Chief Complaint  Patient presents with   Anxiety   Depression   ANXIETY/STRESS Follow-up today for mood, increased Buspar  and added on Effexor  at visit on 11/18/23.  Currently taking Wellbutrin  (at max dose), Effexor , and Buspar  (at max dose). Had to stop Celexa  due to interactions with Amiodarone  which he was started on a >1 month back for heart.  Some anxiety after having cardiac arrest months back.  He reports with medication changes he is feeling better.  Constipation is also improving since last visit.  He is able to start driving again on January 4th, was not able to drive for 6 months. Duration: improving Anxious mood: improving  Excessive worrying: improving Irritability: no  Sweating: no Nausea: no Palpitations:no Hyperventilation: no Panic attacks: no Agoraphobia: no  Obscessions/compulsions: no Depressed mood: no    11/14/2023   11:19 AM 11-18-23    3:08 PM 09/26/2023    3:59 PM 08/11/2023    3:17 PM 06/12/2023    1:13 PM  Depression screen PHQ 2/9  Decreased Interest 1 2 0 0 0  Down, Depressed, Hopeless 1 2 1  0 1  PHQ - 2 Score 2 4 1  0 1  Altered sleeping 1 2 2 1 1   Tired, decreased energy 1 2 2 1  0  Change in appetite 0 1 1 0 0  Feeling bad or failure about yourself  0 0 0 0 0  Trouble concentrating 1 2 1  0 0  Moving slowly or fidgety/restless 0 1 1 0 0  Suicidal thoughts 0 0 0 0 0  PHQ-9 Score 5 12 8 2 2   Difficult doing work/chores Not difficult at all Somewhat difficult Somewhat difficult Not difficult at all   Anhedonia: no Weight changes: no Insomnia: none  Hypersomnia: no Fatigue/loss of energy:  occasional Feelings of worthlessness: no Feelings of guilt: no Impaired  concentration/indecisiveness: no Suicidal ideations: no  Crying spells: yes x 1 since last visit Recent Stressors/Life Changes: yes   Relationship problems: no   Family stress: no     Financial stress: no    Job stress: no    Recent death/loss: no     November 18, 2023    3:08 PM 09/26/2023    4:00 PM 08/11/2023    3:18 PM 06/12/2023    1:14 PM  GAD 7 : Generalized Anxiety Score  Nervous, Anxious, on Edge 2 1 0 0  Control/stop worrying 1 1 1  0  Worry too much - different things 1 1 1 1   Trouble relaxing 1 1 0 0  Restless 1 1 0 0  Easily annoyed or irritable 1 1 1  0  Afraid - awful might happen 1 2 1  0  Total GAD 7 Score 8 8 4 1   Anxiety Difficulty Somewhat difficult Somewhat difficult Somewhat difficult    Relevant past medical, surgical, family and social history reviewed and updated as indicated. Interim medical history since our last visit reviewed. Allergies and medications reviewed and updated.  Review of Systems  Constitutional:  Negative for activity change, diaphoresis, fatigue and fever.  Respiratory:  Negative for cough, chest tightness, shortness  of breath and wheezing.   Cardiovascular:  Negative for chest pain, palpitations and leg swelling.  Gastrointestinal:  Positive for constipation (improving).  Endocrine: Negative for cold intolerance, heat intolerance, polydipsia, polyphagia and polyuria.  Neurological: Negative.   Psychiatric/Behavioral:  Negative for decreased concentration, self-injury, sleep disturbance and suicidal ideas. The patient is nervous/anxious (improving).    Per HPI unless specifically indicated above     Objective:    BP (!) 97/57   Pulse 76   Temp 97.6 F (36.4 C) (Oral)   Wt 204 lb 6.4 oz (92.7 kg)   SpO2 97%   BMI 28.51 kg/m   Wt Readings from Last 3 Encounters:  11/14/23 204 lb 6.4 oz (92.7 kg)  10/24/23 202 lb 9.6 oz (91.9 kg)  10/11/23 205 lb 3.2 oz (93.1 kg)    Physical Exam Vitals and nursing note reviewed.  Constitutional:       General: He is awake. He is not in acute distress.    Appearance: Normal appearance. He is well-developed and well-groomed. He is not ill-appearing or toxic-appearing.  HENT:     Head: Normocephalic.     Right Ear: Hearing and external ear normal.     Left Ear: Hearing and external ear normal.  Eyes:     General: Lids are normal.     Extraocular Movements: Extraocular movements intact.     Conjunctiva/sclera: Conjunctivae normal.  Neck:     Thyroid : No thyromegaly.     Vascular: No carotid bruit.  Cardiovascular:     Rate and Rhythm: Normal rate and regular rhythm.     Heart sounds: Normal heart sounds. No murmur heard.    No gallop.  Pulmonary:     Effort: No accessory muscle usage or respiratory distress.     Breath sounds: Normal breath sounds.  Abdominal:     General: Bowel sounds are normal. There is no distension.     Palpations: Abdomen is soft. There is no hepatomegaly or mass.     Tenderness: There is no abdominal tenderness. There is no right CVA tenderness or left CVA tenderness.  Musculoskeletal:     Cervical back: Full passive range of motion without pain.     Right lower leg: No edema.     Left lower leg: No edema.  Lymphadenopathy:     Cervical: No cervical adenopathy.  Skin:    General: Skin is warm.     Capillary Refill: Capillary refill takes less than 2 seconds.  Neurological:     Mental Status: He is alert and oriented to person, place, and time.     Deep Tendon Reflexes: Reflexes are normal and symmetric.     Reflex Scores:      Brachioradialis reflexes are 2+ on the right side and 2+ on the left side.      Patellar reflexes are 2+ on the right side and 2+ on the left side. Psychiatric:        Attention and Perception: Attention normal.        Mood and Affect: Mood normal.        Speech: Speech normal.        Behavior: Behavior normal. Behavior is cooperative.        Thought Content: Thought content normal.    Results for orders placed or  performed in visit on 10/20/23  TSH   Collection Time: 10/20/23 10:50 AM  Result Value Ref Range   TSH 4.180 0.450 - 4.500 uIU/mL  T4, free  Collection Time: 10/20/23 10:50 AM  Result Value Ref Range   Free T4 2.03 (H) 0.82 - 1.77 ng/dL      Assessment & Plan:   Problem List Items Addressed This Visit       Digestive   Slow transit constipation   Improving at this time, only occasional straining.  Continue current bowel regimen which is offering benefit.        Other   Anxiety   Chronic, improving.  Suspect some element of PTSD due to recent traumatic event with cardiac arrest and being brought back to life.  Can not take multiple medications for mood due to Amiodarone .  Continue Wellbutrin  as ordered (max dose), Buspar  20 MG TID (max dose) + Effexor  37.5 MG daily.  Denies SI/HI.  Monitor Wellbutrin  and Toprol  XL use -- discussed may need to consider mood medication adjustments in future.  Has not attended therapy, is thinking about this.      Depression - Primary   Chronic, improving.  Suspect some element of PTSD due to recent traumatic event with cardiac arrest and being brought back to life.  Can not take multiple medications for mood due to Amiodarone .  Continue Wellbutrin  as ordered (max dose), Buspar  20 MG TID (max dose) + Effexor  37.5 MG daily.  Denies SI/HI.  Monitor Wellbutrin  and Toprol  XL use -- discussed may need to consider mood medication adjustments in future.  Has not attended therapy, is thinking about this.        Follow up plan: Return for as scheduled in March.

## 2023-11-14 NOTE — Telephone Encounter (Signed)
 Appointment scheduled for 12/01/23

## 2023-11-14 NOTE — Assessment & Plan Note (Signed)
Improving at this time, only occasional straining.  Continue current bowel regimen which is offering benefit.

## 2023-11-28 ENCOUNTER — Encounter: Payer: Self-pay | Admitting: Nurse Practitioner

## 2023-11-28 ENCOUNTER — Telehealth: Payer: Self-pay | Admitting: Cardiovascular Disease

## 2023-11-28 DIAGNOSIS — K5901 Slow transit constipation: Secondary | ICD-10-CM

## 2023-11-28 MED ORDER — ENTRESTO 24-26 MG PO TABS: 1.0000 | ORAL_TABLET | Freq: Two times a day (BID) | ORAL | 0 refills | Status: DC

## 2023-11-28 NOTE — Telephone Encounter (Signed)
 Requested Prescriptions   Signed Prescriptions Disp Refills   sacubitril-valsartan (ENTRESTO) 24-26 MG 60 tablet 0    Sig: Take 1 tablet by mouth 2 (two) times daily.    Authorizing Provider: [name] A    Ordering User: Guerry Minors

## 2023-11-28 NOTE — Telephone Encounter (Signed)
*  STAT* If patient is at the pharmacy, call can be transferred to refill team.   1. Which medications need to be refilled? (please list name of each medication and dose if known)  sacubitril -valsartan  (ENTRESTO ) 24-26 MG  2. Which pharmacy/location (including street and city if local pharmacy) is medication to be sent to? [facility] - [place], [place] - [address] Phone: [phone]  Fax: [phone]     3. Do they need a 30 day or 90 day supply? 30

## 2023-12-01 ENCOUNTER — Ambulatory Visit: Payer: Medicare HMO | Attending: Cardiology | Admitting: Cardiology

## 2023-12-01 ENCOUNTER — Encounter: Payer: Self-pay | Admitting: Cardiology

## 2023-12-01 VITALS — BP 111/80 | HR 92 | Ht 71.0 in | Wt 197.4 lb

## 2023-12-01 DIAGNOSIS — I255 Ischemic cardiomyopathy: Secondary | ICD-10-CM

## 2023-12-01 DIAGNOSIS — E785 Hyperlipidemia, unspecified: Secondary | ICD-10-CM

## 2023-12-01 DIAGNOSIS — K5901 Slow transit constipation: Secondary | ICD-10-CM

## 2023-12-01 DIAGNOSIS — Z951 Presence of aortocoronary bypass graft: Secondary | ICD-10-CM

## 2023-12-01 DIAGNOSIS — E039 Hypothyroidism, unspecified: Secondary | ICD-10-CM

## 2023-12-01 DIAGNOSIS — I2510 Atherosclerotic heart disease of native coronary artery without angina pectoris: Secondary | ICD-10-CM

## 2023-12-01 DIAGNOSIS — I5020 Unspecified systolic (congestive) heart failure: Secondary | ICD-10-CM

## 2023-12-01 DIAGNOSIS — N1831 Chronic kidney disease, stage 3a: Secondary | ICD-10-CM

## 2023-12-01 DIAGNOSIS — I472 Ventricular tachycardia, unspecified: Secondary | ICD-10-CM

## 2023-12-01 DIAGNOSIS — I480 Paroxysmal atrial fibrillation: Secondary | ICD-10-CM | POA: Diagnosis not present

## 2023-12-01 MED ORDER — AMIODARONE HCL 100 MG PO TABS: 100.0000 mg | ORAL_TABLET | Freq: Every day | ORAL | 3 refills | Status: DC

## 2023-12-01 NOTE — Patient Instructions (Signed)
Medication Instructions:  Your physician has recommended you make the following change in your medication:   DECREASE Amiodarone to 100 mg once daily INCREASE Miralax to twice a day  *If you need a refill on your cardiac medications before your next appointment, please call your pharmacy*   Lab Work: None  If you have labs (blood work) drawn today and your tests are completely normal, you will receive your results only by: MyChart Message (if you have MyChart) OR A paper copy in the mail If you have any lab test that is abnormal or we need to change your treatment, we will call you to review the results.   Testing/Procedures: None   Follow-Up: At [facility], you and your health needs are our priority.  As part of our continuing mission to provide you with exceptional heart care, we have created designated Provider Care Teams.  These Care Teams include your primary Cardiologist (physician) and Advanced Practice Providers (APPs -  Physician Assistants and Nurse Practitioners) who all work together to provide you with the care you need, when you need it.   Your next appointment:   2 week(s)  Provider:   Charlsie Quest, NP

## 2023-12-01 NOTE — Progress Notes (Signed)
Cardiology Office Note:  .   Date:  12/01/2023  ID:  Stuart Harvey, DOB 05-02-1951, MRN [id number] PCP: Marjie Skiff, NP  [facility] Providers Cardiologist:  [name], MD Electrophysiologist:  Lanier Prude, MD    History of Present Illness: Stuart Harvey   Stuart Harvey is a 73 y.o. male with a past medical history of CAD status post CABG x 4 (2008) chronic HFrEF/ICM, PAF on apixaban, hypertension, hyperlipidemia, infrarenal AAA status post open repair, right iliac artery aneurysm status post EVAR (07/2021), hypothyroidism, anemia, depression, GERD, VT arrest status post ICD placement, who is here today for follow-up.   Patient was originally admitted in 05/2018 after he was found down on his back porch with possible seizure-like activity.  Per EMR, EMS found patient to be in VT at that time that required defibrillation.  Echocardiogram revealed an LVEF of 50-55%.  Diagnostic heart catheterization showed borderline disease in the SVG to OM 3 but otherwise grahams were patent.  Ultimately presentation was felt to be secondary to recent stroke.  In 06/2022 patient reported worsening dyspnea and left-sided chest pain.  Myoview was ordered and showed an LVEF of 38% with evidence of prior MI.  Subsequent echocardiogram confirmed depressed EF of 35-40% with global hypokinesis.  This lateral R/LHC in 07/2022 which showed severe multivessel native coronary artery disease with occlusion of the SVG ->OM 3 but patent LIMA->LAD, SVG->D2, and SVG -> RPDA.   He presented to [facility] on 05/18/23 via EMS after he was found in the garage sitting on the floor holding his head stating that he was feeling dizzy.  He attempted to stand up but then fell forward and apparently lost consciousness.  His wife said she thought he was turning blue she denies test for pulse but did begin CPR after calling EMS.  She continued CPR and interrupt until EMS arrived and about 5-6 minutes.  EMS continued CPR.  Patient was  reportedly pulseless on EMS arrival and was bleeding from the chest up.  ROSC was achieved after an additional 4 minutes of CPR.  No medications were administered patient was placed on a nonrebreather mask and was taken to [facility] where he required intubation on arrival.  Initial EKG revealed atrial fibrillation with rate around 80 bpm and he was hypotensive.  High-sensitivity troponin mildly elevated at 21-40-1010-116 consistent with demand ischemia.  BNP was 513.6.  WBCs of 18.2, hyperkalemia 5.7 and AKI with creatinine 2.01.  Imaging including head, chest, abdomen, and pelvic CTs that did not show any acute findings other than acute rib fractures from CPR.  He was admitted to ICU and cardiology was consulted for further evaluation.  He was started on IV amiodarone, home CHF medications were held and he was started on IV heparin and lieu of apixaban.  He was able to be extubated on 05/19/2023.  Echocardiogram showed an LVEF of 40-45% with inferior lateral wall hypokinesis with G1 DD.  He was briefly diuresed with IV Lasix and also treated for possible aspiration pneumonia.  He underwent a successful DCCV with return of sinus rhythm on 05/22/2023.  EP was consulted was felt that VT cannot be excluded from the cause of cardiac arrest.  Cardiac MRI was performed on 05/24/2023 which showed a moderately reduced LVEF of 38% with lateral wall LV basal-mid subendocardial scar/LGE.  LGE/scar accounting for 12.77 g (11%) of total myocardial mass.  Findings were consistent with prior LV infarct.  He was transferred to [facility] for further  EP study.  EP study was completed on 05/26/2023 which showed injectable sustained monomorphic VT.  Given inability to safely pause anticoagulation given his recent cardioversion LifeVest was placed with plans to bring him back for an ICD after 4 weeks of uninterrupted anticoagulation.  He was discharged on p.o. amiodarone taper, apixaban 5 mg twice daily and Toprol-XL 12.5 mg daily.  No aspirin  was needed due to full anticoagulation and he was continued on rosuvastatin and ezetimibe.  He had previously not been started on spironolactone or SGLT2 inhibitor in the past due to renal function.  He was considered stable for discharge and discharged home from the facility on 05/27/2023.  He resolved back to the facility for the implantation of a dual-chamber ICD on 07/03/2023 by Dr. Lalla Brothers.  There were no immediate postprocedure complications.  CXR on 07/04/23 demonstrated no pneumothorax status post device implantation.  He was considered stable for discharge on 07/04/2023.  At that time he was advised to be no driving for 6 months from the date and initial visit on May 18, 2023.    He was last seen in clinic 10/11/2023 by Dr. Lalla Brothers of EP.  At that time he reported no episodes of fast heart rates intermittent staying active.  He had been experience difficulty sleeping and started taking a supplement.  He also expressed a desire to resume driving which should be restricted due to his cardiac condition.  Patient is aware of 71-month restriction.  And is able to resume driving part-time after that.  Was advised as long as there were no shocks from his ICD.  He was sent for he was sent for blood as he was continued on amiodarone no other changes were made to his medication or further testing were ordered at that time.  He returns to clinic today accompanied by his wife with a list of questions and concerns.  He states that he has been suffering from a increased amount of constipation and after having a further conversation with his primary care provider she advised it was likely his amiodarone and should follow-up with cardiology.  He states that he has been unable to eat and has been losing weight, has not slept for the past 2 or 3 nights, has been increasing his fiber, still not having a large quantity of water, and is been taking magnesium citrate to move his bowels as he is having an increased amount of  constipation requiring extreme straining.  He states that he has been compliant with his current medication regimen.  States that he has not missed any doses of his apixaban and denies any blood in his stool or urine.  With his complaints of continued constipation his primary care provider has sent a referral to gastroenterology.  He denies any hospitalizations or visits to the emergency department.   ROS: 10 point review of system has been reviewed and considered negative with exception what is been listed in the HPI  Studies Reviewed: Stuart Harvey   EKG Interpretation Date/Time:  Friday December 01 2023 14:14:28 EST Ventricular Rate:  92 PR Interval:  114 QRS Duration:  132 QT Interval:  398 QTC Calculation: 492 R Axis:   -68  Text Interpretation: Sinus rhythm with Premature atrial complexes Left axis deviation Non-specific intra-ventricular conduction block Minimal voltage criteria for LVH, may be normal variant ( Cornell product ) T wave abnormality, consider lateral ischemia When compared with ECG of 06-Sep-2023 10:33, Confirmed by Charlsie Quest ([id number]) on 12/01/2023 3:08:55 PM  Echocardiogram 05/19/2023: Impressions: 1. Left ventricular ejection fraction, by estimation, is 40 to 45%. The  left ventricle has mildly decreased function. The left ventricle  demonstrates regional wall motion abnormalities (inferolateral wall  hypokinesis). There is moderate asymmetric left  ventricular hypertrophy of the septal segment. Left ventricular diastolic  parameters are consistent with Grade I diastolic dysfunction (impaired  relaxation).   2. Right ventricular systolic function is normal. The right ventricular  size is normal. There is normal pulmonary artery systolic pressure. The  estimated right ventricular systolic pressure is 29.0 mmHg.   3. The mitral valve is normal in structure. No evidence of mitral valve  regurgitation. No evidence of mitral stenosis.   4. Tricuspid valve regurgitation is  moderate.   5. The aortic valve is normal in structure. Aortic valve regurgitation is  not visualized. No aortic stenosis is present.   6. There is borderline dilatation of the aortic root, measuring 40 mm.   7. The inferior vena cava is normal in size with greater than 50%  respiratory variability, suggesting right atrial pressure of 3 mmHg.    Cardiac MRI 05/23/2023: Impressions: 1.  Moderately reduced LV systolic function.  LVEF = 38% 2.  Lateral wall LV Basal-mid subendocardial scar/LGE. 3.  LGE/scar accounts for 12.77g (11%) of total myocardial mass. 4.  Normal RV function. 5.  Findings consistent with prior LV infarct. 6.  Ischemic cardiomyopathy.   EP Study 05/26/2023: Conclusions: 1. Sinus rhythm upon presentation.  2. Sustained, monomorphic VT inducible at EP study 3. AV conduction appears nodal. 4. No early apparent complications.  5. Start Amiodarone 6. Plan for outpatient ICD implant. Will bridge with a LifeVest until he can return for ICD implant. Risk Assessment/Calculations:    CHA2DS2-VASc Score = 4   This indicates a 4.8% annual risk of stroke. The patient's score is based upon: CHF History: 1 HTN History: 1 Diabetes History: 0 Stroke History: 0 Vascular Disease History: 1 Age Score: 1 Gender Score: 0            Physical Exam:   VS:  BP 111/80 (BP Location: Left Arm, Patient Position: Sitting, Cuff Size: Normal)   Pulse 92   Ht 5\' 11"  (1.803 m)   Wt 197 lb 6.4 oz (89.5 kg)   SpO2 97%   BMI 27.53 kg/m    Wt Readings from Last 3 Encounters:  12/01/23 197 lb 6.4 oz (89.5 kg)  11/14/23 204 lb 6.4 oz (92.7 kg)  10/24/23 202 lb 9.6 oz (91.9 kg)    GEN: Well nourished, well developed in no acute distress NECK: No JVD; No carotid bruits CARDIAC: RRR, no murmurs, rubs, gallops RESPIRATORY:  Clear to auscultation without rales, wheezing or rhonchi  ABDOMEN: Soft, non-tender, non-distended EXTREMITIES:  No edema; No deformity   ASSESSMENT AND PLAN: .    Constipation that has been life altering that is causing weight loss, decreased appetite, abdominal discomfort, and altered sleep.  Have advised patient of the importance of his current medications.  Amiodarone was decreased to 100 mg daily, he was advised to increase his MiraLAX to twice daily dosing as well as increasing his water intake and being more ambulatory.  He has a referral that has been sent to gastroenterology by his PCP.  Further management will need to be handled by his PCP.  Cardiac arrest with monomorphic VT had bystander CPR by his wife when EMS arrived they continued CPR and ROSC was achieved.  Cardiac MRI showed an LVEF of 30% with  lateral wall LV basal/mid subendocardial scar/LGE.  Findings were consistent with prior LV infarct.  He was transferred to [facility] underwent EP study which showed inducible sustained monomorphic ventricular tachycardia.  ICD has been placed he was loaded on p.o. amiodarone and has been continued on amiodarone with decreased dosing from 200 mg daily to 100 mg daily today.  Patient is adamant he would like to come off of amiodarone will forward his concerns to EP and await further recommendations.  Paroxysmal atrial fibrillation EKG today revealing sinus rhythm with PACs.  He is continued on amiodarone 100 mg daily, apixaban 5 mg twice daily for CHA2DS2-VASc of at least 4 for stroke prophylaxis.  He denies any bleeding or noted blood in his urine or stool.  Coronary disease status post CABG with remote CABG in 2023.  Cardiac MRI shows findings consistent with prior LV infarct.  EKG today reveals no significant changes sinus rhythm with PACs.  He denies any chest pain or anginal equivalents today.  He has been continued on apixaban and lieu of aspirin, beta-blocker therapy, rosuvastatin and ezetimibe.  No further ischemic workup is needed at this time.  Chronic HFrEF/ischemic cardiomyopathy with last echocardiogram revealed LVEF of 40-45% with inferior wall  hypokinesis.  He appears to be euvolemic on exam and denies any shortness of breath.  He is continued on Toprol-XL 12.5 mg daily, Entresto 24/26 mg twice daily.  He has not required daily diuretic as he appears to be euvolemic on exam.  Has not been on MRA or SGLT2 inhibitor due to prior renal function.  He has been encouraged to continue to weigh self daily limit his sodium intake and watch excess fluids which the fine balance of dehydration force constipation will be difficult.  Mixed hyperlipidemia where he is continued on rosuvastatin and ezetimibe.  Hypothyroidism was continued on levothyroxine this continues to be managed by his PCP.  CKD stage III with baseline serum creatinine 1.53 which is remained stable.  He is encouraged to maintain adequate hydration and avoid NSAIDs/nephrotoxic agents.  Encouraged to continue to follow-up with nephrology.       Dispo: Patient to return to clinic to see MD/APP in 2 weeks for reevaluation of symptoms after medication adjustments have been made  Signed, Harmoni Lucus, NP

## 2023-12-13 ENCOUNTER — Other Ambulatory Visit: Payer: Self-pay | Admitting: Cardiovascular Disease

## 2023-12-13 DIAGNOSIS — I480 Paroxysmal atrial fibrillation: Secondary | ICD-10-CM

## 2023-12-13 NOTE — Telephone Encounter (Signed)
Refill Request.

## 2023-12-13 NOTE — Telephone Encounter (Signed)
Prescription refill request for Eliquis received. Indication: PAF Last office visit: 12/01/23  Lionel December NP Scr: 1.54 on 10/11/23 Age: 73 Weight: 89.5kg  Based on above findings Eliquis 5mg  twice daily is the appropriate dose.  Refill approved.

## 2023-12-14 ENCOUNTER — Encounter: Payer: Self-pay | Admitting: Cardiology

## 2023-12-14 ENCOUNTER — Ambulatory Visit: Payer: Medicare HMO | Attending: Cardiology | Admitting: Cardiology

## 2023-12-14 VITALS — BP 130/82 | HR 85 | Ht 69.0 in | Wt 200.0 lb

## 2023-12-14 DIAGNOSIS — K5900 Constipation, unspecified: Secondary | ICD-10-CM

## 2023-12-14 DIAGNOSIS — E785 Hyperlipidemia, unspecified: Secondary | ICD-10-CM

## 2023-12-14 DIAGNOSIS — I472 Ventricular tachycardia, unspecified: Secondary | ICD-10-CM

## 2023-12-14 DIAGNOSIS — I480 Paroxysmal atrial fibrillation: Secondary | ICD-10-CM | POA: Diagnosis not present

## 2023-12-14 DIAGNOSIS — I255 Ischemic cardiomyopathy: Secondary | ICD-10-CM

## 2023-12-14 DIAGNOSIS — Z951 Presence of aortocoronary bypass graft: Secondary | ICD-10-CM

## 2023-12-14 DIAGNOSIS — I5020 Unspecified systolic (congestive) heart failure: Secondary | ICD-10-CM

## 2023-12-14 DIAGNOSIS — K5901 Slow transit constipation: Secondary | ICD-10-CM

## 2023-12-14 DIAGNOSIS — I2510 Atherosclerotic heart disease of native coronary artery without angina pectoris: Secondary | ICD-10-CM | POA: Diagnosis not present

## 2023-12-14 DIAGNOSIS — N1831 Chronic kidney disease, stage 3a: Secondary | ICD-10-CM

## 2023-12-14 NOTE — Progress Notes (Signed)
Cardiology Office Note:  .   Date:  12/14/2023  ID:  Stuart Harvey, DOB 1951/03/24, MRN [id number] PCP: Marjie Skiff, NP  [facility] Providers Cardiologist:  [name], MD Electrophysiologist:  Lanier Prude, MD    History of Present Illness: Stuart Harvey   [name] is a 73 y.o. male with a past medical history of CAD status post CABG x 4 (2008), chronic HFrEF/ICM, PAF on apixaban, hypertension, hyperlipidemia, infrarenal AAA status post open repair, right iliac artery aneurysm status post EVAR (07/2021), hypothyroidism, anemia, depression, GERD, VT arrest status post ICD implantation, who is here today for follow-up of his coronary artery disease and VT.   Patient was originally admitted in 05/2018 after he was found down on his back porch with possible seizure-like activity.  Per EMR, EMS found patient to be in VT at that time that required defibrillation.  Echocardiogram revealed an LVEF of 50-55%.  Diagnostic heart catheterization showed borderline disease in the SVG to OM 3 but otherwise grahams were patent.  Ultimately presentation was felt to be secondary to recent stroke.  In 06/2022 patient reported worsening dyspnea and left-sided chest pain.  Myoview was ordered and showed an LVEF of 38% with evidence of prior MI.  Subsequent echocardiogram confirmed depressed EF of 35-40% with global hypokinesis.  This lateral R/LHC in 07/2022 which showed severe multivessel native coronary artery disease with occlusion of the SVG ->OM 3 but patent LIMA->LAD, SVG->D2, and SVG -> RPDA.   He presented to [facility] on 05/18/23 via EMS after he was found in the garage sitting on the floor holding his head stating that he was feeling dizzy.  He attempted to stand up but then fell forward and apparently lost consciousness.  His wife said she thought he was turning blue she denies test for pulse but did begin CPR after calling EMS.  She continued CPR and interrupt until EMS arrived and about 5-6 minutes.   EMS continued CPR.  Patient was reportedly pulseless on EMS arrival and was bleeding from the chest up.  ROSC was achieved after an additional 4 minutes of CPR.  No medications were administered patient was placed on a nonrebreather mask and was taken to [facility] where he required intubation on arrival.  Initial EKG revealed atrial fibrillation with rate around 80 bpm and he was hypotensive.  High-sensitivity troponin mildly elevated at [id number] consistent with demand ischemia.  BNP was 513.6.  WBCs of 18.2, hyperkalemia 5.7 and AKI with creatinine 2.01.  Imaging including head, chest, abdomen, and pelvic CTs that did not show any acute findings other than acute rib fractures from CPR.  He was admitted to ICU and cardiology was consulted for further evaluation.  He was started on IV amiodarone, home CHF medications were held and he was started on IV heparin and lieu of apixaban.  He was able to be extubated on 05/19/2023.  Echocardiogram showed an LVEF of 40-45% with inferior lateral wall hypokinesis with G1 DD.  He was briefly diuresed with IV Lasix and also treated for possible aspiration pneumonia.  He underwent a successful DCCV with return of sinus rhythm on 05/22/2023.  EP was consulted was felt that VT cannot be excluded from the cause of cardiac arrest.  Cardiac MRI was performed on 05/24/2023 which showed a moderately reduced LVEF of 38% with lateral wall LV basal-mid subendocardial scar/LGE.  LGE/scar accounting for 12.77 g (11%) of total myocardial mass.  Findings were consistent with prior LV infarct.  He  was transferred to [facility] for further EP study.  EP study was completed on 05/26/2023 which showed injectable sustained monomorphic VT.  Given inability to safely pause anticoagulation given his recent cardioversion LifeVest was placed with plans to bring him back for an ICD after 4 weeks of uninterrupted anticoagulation.  He was discharged on p.o. amiodarone taper, apixaban 5 mg twice daily and  Toprol-XL 12.5 mg daily.  No aspirin was needed due to full anticoagulation and he was continued on rosuvastatin and ezetimibe.  He had previously not been started on spironolactone or SGLT2 inhibitor in the past due to renal function.  He was considered stable for discharge and discharged home from the facility on 05/27/2023.  He resolved back to the facility for the implantation of a dual-chamber ICD on 07/03/2023 by Dr. Lalla Brothers.  There were no immediate postprocedure complications.  CXR on 07/04/23 demonstrated no pneumothorax status post device implantation.  He was considered stable for discharge on 07/04/2023.  At that time he was advised to be no driving for 6 months from the date and initial visit on May 18, 2023.    He was last seen in clinic 12/01/2023 accompanied by his wife with listed questions and concerns primarily related to his amiodarone.  He had followed up with his primary care provider and was advised that likely his complaints of constipation were coming from his medications.  He was requesting to be able to come off of the medication unfortunately due to his PAF and VT arrest requiring ICD placement his amiodarone was not discontinued but his dose was decreased and he was encouraged to increase his fluid as well as his MiraLAX and return to the office in the next couple of weeks for reevaluation.  During that time we also reached out to EP to ensure that there were no other medication changes that need to be made or recommendations on dosing of his amiodarone.  He was recommended to that he would be able to be left at a reduced dose but not come off the medication altogether.  He returns to clinic today accompanied by his wife.  He has a continued pattern of constipation since his last visit for semiannual decrease, his MiraLAX was increased to twice daily, and he was encouraged to increase his walking and his water intake.  He has been using mag citrate but is scared that with straining in the  bathroom that he might some of his defibrillator.  He denies any chest pain, shortness of breath, lightheadedness/dizziness or peripheral edema.  States he has been compliant with his current medication regimen.  Denies noting any blood in his stool or urine with occasional traces of blood on toilet paper after he has been sitting on the toilet for an extended period of time.  ROS: 10 point review of systems has been reviewed and considered negative with the exception what is been listed in HPI  Studies Reviewed: Stuart Harvey   EKG Interpretation Date/Time:  Thursday December 14 2023 14:28:00 EST Ventricular Rate:  85 PR Interval:  120 QRS Duration:  126 QT Interval:  394 QTC Calculation: 468 R Axis:   -58  Text Interpretation: Normal sinus rhythm Left axis deviation Non-specific intra-ventricular conduction block Minimal voltage criteria for LVH, may be normal variant ( Cornell product ) Nonspecific T wave abnormality When compared with ECG of 01-Dec-2023 14:14, T wave inversion more evident in Lateral leads Confirmed by Charlsie Quest ([id number]) on 12/14/2023 2:35:00 PM    Echocardiogram 05/19/2023: Impressions:  1. Left ventricular ejection fraction, by estimation, is 40 to 45%. The  left ventricle has mildly decreased function. The left ventricle  demonstrates regional wall motion abnormalities (inferolateral wall  hypokinesis). There is moderate asymmetric left  ventricular hypertrophy of the septal segment. Left ventricular diastolic  parameters are consistent with Grade I diastolic dysfunction (impaired  relaxation).   2. Right ventricular systolic function is normal. The right ventricular  size is normal. There is normal pulmonary artery systolic pressure. The  estimated right ventricular systolic pressure is 29.0 mmHg.   3. The mitral valve is normal in structure. No evidence of mitral valve  regurgitation. No evidence of mitral stenosis.   4. Tricuspid valve regurgitation is moderate.   5. The  aortic valve is normal in structure. Aortic valve regurgitation is  not visualized. No aortic stenosis is present.   6. There is borderline dilatation of the aortic root, measuring 40 mm.   7. The inferior vena cava is normal in size with greater than 50%  respiratory variability, suggesting right atrial pressure of 3 mmHg.    Cardiac MRI 05/23/2023: Impressions: 1.  Moderately reduced LV systolic function.  LVEF = 38% 2.  Lateral wall LV Basal-mid subendocardial scar/LGE. 3.  LGE/scar accounts for 12.77g (11%) of total myocardial mass. 4.  Normal RV function. 5.  Findings consistent with prior LV infarct. 6.  Ischemic cardiomyopathy.   EP Study 05/26/2023: Conclusions: 1. Sinus rhythm upon presentation.  2. Sustained, monomorphic VT inducible at EP study 3. AV conduction appears nodal. 4. No early apparent complications.  5. Start Amiodarone 6. Plan for outpatient ICD implant. Will bridge with a LifeVest until he can return for ICD implant. Risk Assessment/Calculations:    CHA2DS2-VASc Score = 4   This indicates a 4.8% annual risk of stroke. The patient's score is based upon: CHF History: 1 HTN History: 1 Diabetes History: 0 Stroke History: 0 Vascular Disease History: 1 Age Score: 1 Gender Score: 0            Physical Exam:   VS:  BP 130/82   Pulse 85   Ht 5\' 9"  (1.753 m)   Wt 200 lb (90.7 kg)   SpO2 96%   BMI 29.53 kg/m    Wt Readings from Last 3 Encounters:  12/14/23 200 lb (90.7 kg)  12/01/23 197 lb 6.4 oz (89.5 kg)  11/14/23 204 lb 6.4 oz (92.7 kg)    GEN: Well nourished, well developed in no acute distress NECK: No JVD; No carotid bruits CARDIAC: RRR, no murmurs, rubs, gallops RESPIRATORY:  Clear to auscultation without rales, wheezing or rhonchi  ABDOMEN: Soft, non-tender, non-distended EXTREMITIES:  No edema; No deformity   ASSESSMENT AND PLAN: .   Continued constipation send the continued use to cause abdominal discomfort and altered sleep.  He has  been advised to continue with his current medications.  Has been advised to continue with MiraLAX twice a day and use mag citrate as needed.  An additional referral has been sent to gastroenterology for further management.  He has also been advised to continued management.  Cardiac arrest with monomorphic VT with bystander CPR by his wife and EMS from the continued CPR and ROSC was achieved.  Cardiac MRI showed an LVEF of 30% with lateral wall LV basal/mid subendocardial scar/LGE.  Findings consistent with prior LV infarct.  He was transferred to [facility] underwent EP study which showed inducible sustained monomorphic ventricular tachycardia.  ICD was placed.  He is continued on amiodarone  100 mg daily, has been encouraged to continue with his remote uploads and follow-up with EP.  Paroxysmal atrial fibrillation with EKG today revealing sinus rhythm with a rate of 85 left intrafascicular block IVCD.  He is continued on amiodarone 100 mg daily, apixaban 5 mg twice daily for CHA2DS2-VASc of at least 4 for stroke prophylaxis.  Continues to deny any bleeding or blood noted in stool and urine without any adverse effects to blood thinning medications.  Coronary disease status post CABG with remote CABG in 2023.  Cardiac MRI showed finding consistent with prior LV infarct.  EKG today reveals no significant changes.  He denies any chest pain or anginal equivalents.  He has been continued on apixaban and lieu of aspirin at 5 mg twice daily, ezetimibe 10 mg daily, Toprol-XL 12.5 mg daily and rosuvastatin 40 mg daily.  Chronic HFrEF/ischemic cardiomyopathy with last echocardiogram revealing LVEF of 40-45% with inferior wall hypokinesis.  He denies any shortness of breath and continues to be euvolemic on exam.  He is continued on Toprol-XL 12.5 mg daily Entresto 24/26 mg twice daily.  He is not on MRA or SGLT2 inhibitor due to prior renal function.  Has been encouraged to continue to weigh self daily limit his sodium  intake and fluid intake.  Mixed hyperlipidemia weights continue rosuvastatin 40 mg daily and ezetimibe 10 mg daily.  CKD stage III with baseline serum creatinine 1.53 which is remained stable.  Encouraged to maintain adequate hydration continue to avoid NSAIDs and nephrotoxic agents.  He is recommended to continue to follow-up with nephrology.       Dispo: Patient to return to clinic to see MD/APP in 6 months or sooner if needed.  Patient has been encouraged to continue with EP follow-ups.  He has also been advised to notify the office within 1 week for has not heard from [place] referral to its been sent to gastroenterology.  Signed, Josephus Harriger, NP

## 2023-12-14 NOTE — Patient Instructions (Signed)
Medication Instructions:  Your physician recommends that you continue on your current medications as directed. Please refer to the Current Medication list given to you today.   *If you need a refill on your cardiac medications before your next appointment, please call your pharmacy*   Lab Work: No labs ordered today    Testing/Procedures: No test ordered today    Follow-Up: At [facility], you and your health needs are our priority.  As part of our continuing mission to provide you with exceptional heart care, we have created designated Provider Care Teams.  These Care Teams include your primary Cardiologist (physician) and Advanced Practice Providers (APPs -  Physician Assistants and Nurse Practitioners) who all work together to provide you with the care you need, when you need it.  We recommend signing up for the patient portal called "MyChart".  Sign up information is provided on this After Visit Summary.  MyChart is used to connect with patients for Virtual Visits (Telemedicine).  Patients are able to view lab/test results, encounter notes, upcoming appointments, etc.  Non-urgent messages can be sent to your provider as well.   To learn more about what you can do with MyChart, go to [email or web address].    Your next appointment:   6 month(s)  Provider:   You may see [name], MD or one of the following Advanced Practice Providers on your designated Care Team:   Nicolasa Ducking, NP Eula Listen, PA-C Cadence Fransico Michael, PA-C Charlsie Quest, NP Carlos Levering, NP

## 2023-12-21 DIAGNOSIS — K5900 Constipation, unspecified: Secondary | ICD-10-CM

## 2023-12-22 NOTE — Telephone Encounter (Signed)
 Please send an additional referral to [facility] for his continued constipation. One was sent by his PCP and we had sent an additional one at his last visit. We had asked them to let us  know if they had not heard from GI on 12/14/23 appointment as in the note. Thanks!!

## 2023-12-28 NOTE — Telephone Encounter (Signed)
Sending a new referral to [facility] is fine.

## 2023-12-29 ENCOUNTER — Encounter: Payer: Self-pay | Admitting: Nurse Practitioner

## 2023-12-29 ENCOUNTER — Other Ambulatory Visit: Payer: Self-pay | Admitting: Nurse Practitioner

## 2023-12-29 DIAGNOSIS — K5901 Slow transit constipation: Secondary | ICD-10-CM

## 2023-12-31 NOTE — Patient Instructions (Incomplete)
 Constipation, Adult Constipation is when a person has trouble pooping (having a bowel movement). When you have this condition, you may poop fewer than 3 times a week. Your poop (stool) may also be dry, hard, or bigger than normal. Follow these instructions at home: Eating and drinking  Eat foods that have a lot of fiber, such as: Fresh fruits and vegetables. Whole grains. Beans. Eat less of foods that are low in fiber and high in fat and sugar, such as: [place] fries. Hamburgers. Cookies. Candy. Soda. Drink enough fluid to keep your pee (urine) pale yellow. General instructions Exercise regularly or as told by your doctor. Try to do 150 minutes of exercise each week. Go to the restroom when you feel like you need to poop. Do not hold it in. Take over-the-counter and prescription medicines only as told by your doctor. These include any fiber supplements. When you poop: Do deep breathing while relaxing your lower belly (abdomen). Relax your pelvic floor. The pelvic floor is a group of muscles that support the rectum, bladder, and intestines (as well as the uterus in women). Watch your condition for any changes. Tell your doctor if you notice any. Keep all follow-up visits as told by your doctor. This is important. Contact a doctor if: You have pain that gets worse. You have a fever. You have not pooped for 4 days. You vomit. You are not hungry. You lose weight. You are bleeding from the opening of the butt (anus). You have thin, pencil-like poop. Get help right away if: You have a fever, and your symptoms suddenly get worse. You leak poop or have blood in your poop. Your belly feels hard or bigger than normal (bloated). You have very bad belly pain. You feel dizzy or you faint. Summary Constipation is when a person poops fewer than 3 times a week, has trouble pooping, or has poop that is dry, hard, or bigger than normal. Eat foods that have a lot of fiber. Drink enough fluid  to keep your pee (urine) pale yellow. Take over-the-counter and prescription medicines only as told by your doctor. These include any fiber supplements. This information is not intended to replace advice given to you by your health care provider. Make sure you discuss any questions you have with your health care provider. Document Revised: 09/14/2022 Document Reviewed: 09/14/2022 Elsevier Patient Education  2024 [employer].

## 2024-01-02 ENCOUNTER — Ambulatory Visit: Payer: Medicare HMO | Admitting: Nurse Practitioner

## 2024-01-02 ENCOUNTER — Ambulatory Visit (INDEPENDENT_AMBULATORY_CARE_PROVIDER_SITE_OTHER): Payer: Medicare HMO

## 2024-01-02 ENCOUNTER — Ambulatory Visit
Admission: RE | Admit: 2024-01-02 | Discharge: 2024-01-02 | Disposition: A | Discharge status: Home / Self Care | Payer: Medicare HMO | Attending: Nurse Practitioner | Admitting: Nurse Practitioner

## 2024-01-02 ENCOUNTER — Ambulatory Visit
Admission: RE | Admit: 2024-01-02 | Discharge: 2024-01-02 | Disposition: A | Discharge status: Home / Self Care | Payer: Medicare HMO | Source: Ambulatory Visit | Attending: Nurse Practitioner | Admitting: Nurse Practitioner

## 2024-01-02 ENCOUNTER — Encounter: Payer: Self-pay | Admitting: Nurse Practitioner

## 2024-01-02 VITALS — BP 103/63 | HR 86 | Temp 97.8°F | Ht 70.0 in | Wt 196.8 lb

## 2024-01-02 DIAGNOSIS — K5901 Slow transit constipation: Secondary | ICD-10-CM

## 2024-01-02 DIAGNOSIS — F419 Anxiety disorder, unspecified: Secondary | ICD-10-CM | POA: Diagnosis not present

## 2024-01-02 DIAGNOSIS — F324 Major depressive disorder, single episode, in partial remission: Secondary | ICD-10-CM | POA: Diagnosis not present

## 2024-01-02 DIAGNOSIS — E039 Hypothyroidism, unspecified: Secondary | ICD-10-CM

## 2024-01-02 DIAGNOSIS — I255 Ischemic cardiomyopathy: Secondary | ICD-10-CM | POA: Diagnosis not present

## 2024-01-02 LAB — CUP PACEART REMOTE DEVICE CHECK
Battery Remaining Longevity: 147 mo
Battery Voltage: 3.05 V
Brady Statistic AP VP Percent: 0 %
Brady Statistic AP VS Percent: 4.77 %
Brady Statistic AS VP Percent: 0.02 %
Brady Statistic AS VS Percent: 95.21 %
Brady Statistic RA Percent Paced: 4.81 %
Brady Statistic RV Percent Paced: 0.02 %
Date Time Interrogation Session: 20250217204851
HighPow Impedance: 75 Ohm
Implantable Lead Connection Status: 753985
Implantable Lead Connection Status: 753985
Implantable Lead Implant Date: 20240819
Implantable Lead Implant Date: 20240819
Implantable Lead Location: 753859
Implantable Lead Location: 753860
Implantable Lead Model: 5076
Implantable Pulse Generator Implant Date: 20240819
Lead Channel Impedance Value: 380 Ohm
Lead Channel Impedance Value: 380 Ohm
Lead Channel Impedance Value: 456 Ohm
Lead Channel Pacing Threshold Amplitude: 0.625 V
Lead Channel Pacing Threshold Amplitude: 0.625 V
Lead Channel Pacing Threshold Pulse Width: 0.4 ms
Lead Channel Pacing Threshold Pulse Width: 0.4 ms
Lead Channel Sensing Intrinsic Amplitude: 1.8 mV
Lead Channel Sensing Intrinsic Amplitude: 14.8 mV
Lead Channel Setting Pacing Amplitude: 1.5 V
Lead Channel Setting Pacing Amplitude: 2.5 V
Lead Channel Setting Pacing Pulse Width: 0.4 ms
Lead Channel Setting Sensing Sensitivity: 0.3 mV
Zone Setting Status: 755011
Zone Setting Status: 755011

## 2024-01-02 MED ORDER — LUBIPROSTONE 24 MCG PO CAPS: 24.0000 ug | ORAL_CAPSULE | Freq: Two times a day (BID) | ORAL | 4 refills | Status: DC

## 2024-01-02 NOTE — Assessment & Plan Note (Signed)
Chronic, ongoing.  Continue current medication regimen and adjust as needed.  TSH and Free T4 today. 

## 2024-01-02 NOTE — Assessment & Plan Note (Signed)
Chronic, improving.  Suspect some element of PTSD due to recent traumatic event with cardiac arrest and being brought back to life.  Can not take multiple medications for mood due to Amiodarone.  Continue Wellbutrin as ordered (max dose), Buspar 20 MG TID (max dose) + Effexor 37.5 MG daily.  Denies SI/HI.  Monitor Wellbutrin and Toprol XL use -- discussed may need to consider mood medication adjustments in future.  Has not attended therapy, is thinking about this.

## 2024-01-02 NOTE — Progress Notes (Signed)
BP 103/63   Pulse 86   Temp 97.8 F (36.6 C) (Oral)   Ht 5\' 10"  (1.778 m)   Wt 196 lb 12.8 oz (89.3 kg)   SpO2 95%   BMI 28.24 kg/m    Subjective:    Patient ID: Stuart Harvey, male    DOB: 1951-06-02, 73 y.o.   MRN: [id number]  HPI: Stuart Harvey is a 73 y.o. male  Chief Complaint  Patient presents with   Constipation   His wife is at bedside to assist with HPI.   CONSTIPATION Ongoing constipation for over two months. Currently having 3 bowel movements a day but these are small. Occasionally straining.  Miralax twice a day at present. Mag Citrate helps him clear bowels. Has tried Dulcolax, stool softeners, Metamucil, Mag Citrate, and suppository.  Wife is trying to get him to eat more fiber.   Duration: > 1 month Status: fluctuating Treatments attempted: as above  Fever: no Nausea: no Vomiting: no Weight loss: no Decreased appetite: a little Diarrhea: no Constipation: yes Blood in stool: no Heartburn: no Jaundice: no  ANXIETY/STRESS Currently taking Wellbutrin (at max dose) and Buspar (at max dose) + Effexor. Had to stop Celexa due to interactions with Amiodarone which he was started > 1 month back for heart. Celexa had offered him benefit for years. Duration:uncontrolled Anxious mood: yes  Excessive worrying: yes Irritability: yes  Sweating: no Nausea: no Palpitations:no Hyperventilation: no Panic attacks: no Agoraphobia: no  Obscessions/compulsions: has had before Depressed mood: occasional    11/14/2023   11:19 AM 11-23-23    3:08 PM 09/26/2023    3:59 PM 08/11/2023    3:17 PM 06/12/2023    1:13 PM  Depression screen PHQ 2/9  Decreased Interest 1 2 0 0 0  Down, Depressed, Hopeless 1 2 1  0 1  PHQ - 2 Score 2 4 1  0 1  Altered sleeping 1 2 2 1 1   Tired, decreased energy 1 2 2 1  0  Change in appetite 0 1 1 0 0  Feeling bad or failure about yourself  0 0 0 0 0  Trouble concentrating 1 2 1  0 0  Moving slowly or fidgety/restless 0 1 1 0 0   Suicidal thoughts 0 0 0 0 0  PHQ-9 Score 5 12 8 2 2   Difficult doing work/chores Not difficult at all Somewhat difficult Somewhat difficult Not difficult at all   Anhedonia: no Weight changes: no Insomnia: yes hard to fall asleep  Hypersomnia: no Fatigue/loss of energy: yes Feelings of worthlessness: yes Feelings of guilt: yes Impaired concentration/indecisiveness: no Suicidal ideations: no  Crying spells: no Recent Stressors/Life Changes: no   Relationship problems: no   Family stress: no     Financial stress: no    Job stress: no    Recent death/loss: no    Nov 23, 2023    3:08 PM 09/26/2023    4:00 PM 08/11/2023    3:18 PM 06/12/2023    1:14 PM  GAD 7 : Generalized Anxiety Score  Nervous, Anxious, on Edge 2 1 0 0  Control/stop worrying 1 1 1  0  Worry too much - different things 1 1 1 1   Trouble relaxing 1 1 0 0  Restless 1 1 0 0  Easily annoyed or irritable 1 1 1  0  Afraid - awful might happen 1 2 1  0  Total GAD 7 Score 8 8 4 1   Anxiety Difficulty Somewhat difficult Somewhat difficult Somewhat difficult  Relevant past medical, surgical, family and social history reviewed and updated as indicated. Interim medical history since our last visit reviewed. Allergies and medications reviewed and updated.  Review of Systems  Constitutional:  Negative for activity change, diaphoresis, fatigue and fever.  Respiratory:  Negative for cough, chest tightness, shortness of breath and wheezing.   Cardiovascular:  Negative for chest pain, palpitations and leg swelling.  Gastrointestinal:  Positive for constipation.  Endocrine: Negative for cold intolerance, heat intolerance, polydipsia, polyphagia and polyuria.  Neurological: Negative.   Psychiatric/Behavioral:  Positive for sleep disturbance. Negative for decreased concentration, self-injury and suicidal ideas. The patient is nervous/anxious.     Per HPI unless specifically indicated above     Objective:    BP 103/63   Pulse  86   Temp 97.8 F (36.6 C) (Oral)   Ht 5\' 10"  (1.778 m)   Wt 196 lb 12.8 oz (89.3 kg)   SpO2 95%   BMI 28.24 kg/m   Wt Readings from Last 3 Encounters:  01/02/24 196 lb 12.8 oz (89.3 kg)  12/14/23 200 lb (90.7 kg)  12/01/23 197 lb 6.4 oz (89.5 kg)    Physical Exam Vitals and nursing note reviewed.  Constitutional:      General: He is awake. He is not in acute distress.    Appearance: Normal appearance. He is well-developed and well-groomed. He is not ill-appearing or toxic-appearing.  HENT:     Head: Normocephalic.     Right Ear: Hearing and external ear normal.     Left Ear: Hearing and external ear normal.  Eyes:     General: Lids are normal.     Extraocular Movements: Extraocular movements intact.     Conjunctiva/sclera: Conjunctivae normal.  Neck:     Thyroid: No thyromegaly.     Vascular: No carotid bruit.  Cardiovascular:     Rate and Rhythm: Normal rate and regular rhythm.     Heart sounds: Normal heart sounds. No murmur heard.    No gallop.  Pulmonary:     Effort: No accessory muscle usage or respiratory distress.     Breath sounds: Normal breath sounds.  Abdominal:     General: Bowel sounds are normal. There is no distension.     Palpations: Abdomen is soft. There is no hepatomegaly or mass.     Tenderness: There is no abdominal tenderness. There is no right CVA tenderness or left CVA tenderness.  Musculoskeletal:     Cervical back: Full passive range of motion without pain.     Right lower leg: No edema.     Left lower leg: No edema.  Lymphadenopathy:     Cervical: No cervical adenopathy.  Skin:    General: Skin is warm.     Capillary Refill: Capillary refill takes less than 2 seconds.  Neurological:     Mental Status: He is alert and oriented to person, place, and time.     Deep Tendon Reflexes: Reflexes are normal and symmetric.     Reflex Scores:      Brachioradialis reflexes are 2+ on the right side and 2+ on the left side.      Patellar reflexes  are 2+ on the right side and 2+ on the left side. Psychiatric:        Attention and Perception: Attention normal.        Mood and Affect: Mood normal.        Speech: Speech normal.        Behavior: Behavior  normal. Behavior is cooperative.        Thought Content: Thought content normal.    Results for orders placed or performed in visit on 10/20/23  TSH   Collection Time: 10/20/23 10:50 AM  Result Value Ref Range   TSH 4.180 0.450 - 4.500 uIU/mL  T4, free   Collection Time: 10/20/23 10:50 AM  Result Value Ref Range   Free T4 2.03 (H) 0.82 - 1.77 ng/dL      Assessment & Plan:   Problem List Items Addressed This Visit       Digestive   Slow transit constipation   Ongoing issue for >1 month since Amiodarone added on and heart surgery.  ?side effect of Amiodarone + increase in anxiety.  OTC medications not offering 100% benefit. Imaging showed mild stool burden present.  Will attempt to get Lubiprostone covered, discussed with patient and wife.  Educated on this.  If any issues obtaining they are not notify provider.  Scheduled to see GI in May, on a wait list to see sooner.  No red flags today.      Relevant Orders   T4, free   TSH     Endocrine   Hypothyroid   Chronic, ongoing.  Continue current medication regimen and adjust as needed.  TSH and Free T4 today.      Relevant Orders   T4, free   TSH     Other   Anxiety   Chronic, improving.  Suspect some element of PTSD due to recent traumatic event with cardiac arrest and being brought back to life.  Can not take multiple medications for mood due to Amiodarone.  Continue Wellbutrin as ordered (max dose), Buspar 20 MG TID (max dose) + Effexor 37.5 MG daily.  Denies SI/HI.  Monitor Wellbutrin and Toprol XL use -- discussed may need to consider mood medication adjustments in future.  Has not attended therapy, is thinking about this.      Depression - Primary   Chronic, improving.  Suspect some element of PTSD due to recent  traumatic event with cardiac arrest and being brought back to life.  Can not take multiple medications for mood due to Amiodarone.  Continue Wellbutrin as ordered (max dose), Buspar 20 MG TID (max dose) + Effexor 37.5 MG daily.  Denies SI/HI.  Monitor Wellbutrin and Toprol XL use -- discussed may need to consider mood medication adjustments in future.  Has not attended therapy, is thinking about this.      Time: 30 minutes, >50% spent counseling/or care coordination   Follow up plan: Return in about 2 weeks (around 01/16/2024) for Constipation.

## 2024-01-02 NOTE — Patient Instructions (Signed)
 Constipation, Adult Constipation is when a person has trouble pooping (having a bowel movement). When you have this condition, you may poop fewer than 3 times a week. Your poop (stool) may also be dry, hard, or bigger than normal. Follow these instructions at home: Eating and drinking  Eat foods that have a lot of fiber, such as: Fresh fruits and vegetables. Whole grains. Beans. Eat less of foods that are low in fiber and high in fat and sugar, such as: [place] fries. Hamburgers. Cookies. Candy. Soda. Drink enough fluid to keep your pee (urine) pale yellow. General instructions Exercise regularly or as told by your doctor. Try to do 150 minutes of exercise each week. Go to the restroom when you feel like you need to poop. Do not hold it in. Take over-the-counter and prescription medicines only as told by your doctor. These include any fiber supplements. When you poop: Do deep breathing while relaxing your lower belly (abdomen). Relax your pelvic floor. The pelvic floor is a group of muscles that support the rectum, bladder, and intestines (as well as the uterus in women). Watch your condition for any changes. Tell your doctor if you notice any. Keep all follow-up visits as told by your doctor. This is important. Contact a doctor if: You have pain that gets worse. You have a fever. You have not pooped for 4 days. You vomit. You are not hungry. You lose weight. You are bleeding from the opening of the butt (anus). You have thin, pencil-like poop. Get help right away if: You have a fever, and your symptoms suddenly get worse. You leak poop or have blood in your poop. Your belly feels hard or bigger than normal (bloated). You have very bad belly pain. You feel dizzy or you faint. Summary Constipation is when a person poops fewer than 3 times a week, has trouble pooping, or has poop that is dry, hard, or bigger than normal. Eat foods that have a lot of fiber. Drink enough fluid  to keep your pee (urine) pale yellow. Take over-the-counter and prescription medicines only as told by your doctor. These include any fiber supplements. This information is not intended to replace advice given to you by your health care provider. Make sure you discuss any questions you have with your health care provider. Document Revised: 09/14/2022 Document Reviewed: 09/14/2022 Elsevier Patient Education  2024 [employer].

## 2024-01-02 NOTE — Assessment & Plan Note (Signed)
Ongoing issue for >1 month since Amiodarone added on and heart surgery.  ?side effect of Amiodarone + increase in anxiety.  OTC medications not offering 100% benefit. Imaging showed mild stool burden present.  Will attempt to get Lubiprostone covered, discussed with patient and wife.  Educated on this.  If any issues obtaining they are not notify provider.  Scheduled to see GI in May, on a wait list to see sooner.  No red flags today.

## 2024-01-02 NOTE — Progress Notes (Signed)
Contacted via MyChart   Good evening, as we discussed and I showed you on imaging, only mild amount of stool throughout colon.  No blockages.  Good news!!

## 2024-01-03 ENCOUNTER — Ambulatory Visit: Payer: Medicare HMO | Admitting: Nurse Practitioner

## 2024-01-03 ENCOUNTER — Encounter: Payer: Self-pay | Admitting: Nurse Practitioner

## 2024-01-03 ENCOUNTER — Encounter: Payer: Self-pay | Admitting: Cardiology

## 2024-01-03 LAB — T4, FREE: Free T4: 2.08 ng/dL — ABNORMAL HIGH (ref 0.82–1.77)

## 2024-01-03 LAB — TSH: TSH: 2.51 u[IU]/mL (ref 0.450–4.500)

## 2024-01-03 MED ORDER — LINACLOTIDE 145 MCG PO CAPS: 145.0000 ug | ORAL_CAPSULE | Freq: Every day | ORAL | 4 refills | Status: DC

## 2024-01-03 NOTE — Addendum Note (Signed)
Addended by: Aura Dials T on: 01/03/2024 11:17 AM   Modules accepted: Orders

## 2024-01-03 NOTE — Progress Notes (Signed)
Contacted via MyChart   Good morning Stuart Harvey, your labs have returned.  TSH remains normal and Free T4 remains a little elevated, this can be related to Amiodarone use.  For now continue your current Levothyroxine dosing and we will continue to monitor closely.  Were you able to get Lubiprostone?   Keep being stellar!!  Thank you for allowing me to participate in your care.  I appreciate you. Kindest regards, Erle Guster

## 2024-01-08 ENCOUNTER — Other Ambulatory Visit: Payer: Self-pay | Admitting: Physician Assistant

## 2024-01-14 NOTE — Patient Instructions (Signed)
 Constipation, Adult Constipation is when a person has trouble pooping (having a bowel movement). When you have this condition, you may poop fewer than 3 times a week. Your poop (stool) may also be dry, hard, or bigger than normal. Follow these instructions at home: Eating and drinking  Eat foods that have a lot of fiber, such as: Fresh fruits and vegetables. Whole grains. Beans. Eat less of foods that are low in fiber and high in fat and sugar, such as: [place] fries. Hamburgers. Cookies. Candy. Soda. Drink enough fluid to keep your pee (urine) pale yellow. General instructions Exercise regularly or as told by your doctor. Try to do 150 minutes of exercise each week. Go to the restroom when you feel like you need to poop. Do not hold it in. Take over-the-counter and prescription medicines only as told by your doctor. These include any fiber supplements. When you poop: Do deep breathing while relaxing your lower belly (abdomen). Relax your pelvic floor. The pelvic floor is a group of muscles that support the rectum, bladder, and intestines (as well as the uterus in women). Watch your condition for any changes. Tell your doctor if you notice any. Keep all follow-up visits as told by your doctor. This is important. Contact a doctor if: You have pain that gets worse. You have a fever. You have not pooped for 4 days. You vomit. You are not hungry. You lose weight. You are bleeding from the opening of the butt (anus). You have thin, pencil-like poop. Get help right away if: You have a fever, and your symptoms suddenly get worse. You leak poop or have blood in your poop. Your belly feels hard or bigger than normal (bloated). You have very bad belly pain. You feel dizzy or you faint. Summary Constipation is when a person poops fewer than 3 times a week, has trouble pooping, or has poop that is dry, hard, or bigger than normal. Eat foods that have a lot of fiber. Drink enough fluid  to keep your pee (urine) pale yellow. Take over-the-counter and prescription medicines only as told by your doctor. These include any fiber supplements. This information is not intended to replace advice given to you by your health care provider. Make sure you discuss any questions you have with your health care provider. Document Revised: 09/14/2022 Document Reviewed: 09/14/2022 Elsevier Patient Education  2024 [employer].

## 2024-01-17 ENCOUNTER — Ambulatory Visit: Payer: Medicare HMO | Admitting: Nurse Practitioner

## 2024-01-17 ENCOUNTER — Encounter: Payer: Self-pay | Admitting: Nurse Practitioner

## 2024-01-17 VITALS — BP 102/56 | HR 76 | Temp 97.6°F | Ht 70.0 in | Wt 195.8 lb

## 2024-01-17 DIAGNOSIS — K5901 Slow transit constipation: Secondary | ICD-10-CM

## 2024-01-17 DIAGNOSIS — F324 Major depressive disorder, single episode, in partial remission: Secondary | ICD-10-CM | POA: Diagnosis not present

## 2024-01-17 DIAGNOSIS — F419 Anxiety disorder, unspecified: Secondary | ICD-10-CM

## 2024-01-17 MED ORDER — LEVOTHYROXINE SODIUM 150 MCG PO TABS: 150.0000 ug | ORAL_TABLET | Freq: Every day | ORAL | 3 refills | Status: AC

## 2024-01-17 MED ORDER — VENLAFAXINE HCL ER 37.5 MG PO CP24: 37.5000 mg | ORAL_CAPSULE | Freq: Every day | ORAL | 2 refills | Status: DC

## 2024-01-17 NOTE — Progress Notes (Signed)
 BP (!) 102/56   Pulse 76   Temp 97.6 F (36.4 C) (Oral)   Ht 5\' 10"  (1.778 m)   Wt 195 lb 12.8 oz (88.8 kg)   SpO2 97%   BMI 28.09 kg/m    Subjective:    Patient ID: Stuart Harvey, male    DOB: [birth year], 73 y.o.   MRN: [id number]  HPI: Stuart Harvey is a 73 y.o. male  Chief Complaint  Patient presents with   Constipation    Patient states he is doing better since starting the Linzess.    His wife is at bedside to assist with HPI.   CONSTIPATION Follow-up today for constipation. Started Linzess at recent visit. He reports there is improvement in bowels with this.  Currently having two BM a day on average. Straining has improved, now very little. Duration: > 1 month Status: improving Treatments attempted: OTC medications, multiple Fever: no Nausea: no Vomiting: no Weight loss: no Decreased appetite: a little Diarrhea: no Constipation: no Blood in stool: no Heartburn: no Jaundice: no  ANXIETY/STRESS Taking Wellbutrin (at max dose) and Buspar (at max dose) + Effexor. Had to stop Celexa due to interactions with Amiodarone which he was started on > 1 month back for heart. Celexa had offered him benefit for years. Duration: stable Anxious mood: yes  Excessive worrying: yes Irritability: yes  Sweating: no Nausea: no Palpitations:no Hyperventilation: no Panic attacks: no Agoraphobia: no  Obscessions/compulsions: stable Depressed mood: no    11/14/2023   11:19 AM 10/29/23    3:08 PM 09/26/2023    3:59 PM 08/11/2023    3:17 PM 06/12/2023    1:13 PM  Depression screen PHQ 2/9  Decreased Interest 1 2 0 0 0  Down, Depressed, Hopeless 1 2 1  0 1  PHQ - 2 Score 2 4 1  0 1  Altered sleeping 1 2 2 1 1   Tired, decreased energy 1 2 2 1  0  Change in appetite 0 1 1 0 0  Feeling bad or failure about yourself  0 0 0 0 0  Trouble concentrating 1 2 1  0 0  Moving slowly or fidgety/restless 0 1 1 0 0  Suicidal thoughts 0 0 0 0 0  PHQ-9 Score 5 12 8 2 2   Difficult  doing work/chores Not difficult at all Somewhat difficult Somewhat difficult Not difficult at all   Anhedonia: no Weight changes: no Insomnia: yes hard to fall asleep  Hypersomnia: no Fatigue/loss of energy: yes Feelings of worthlessness: yes Feelings of guilt: yes Impaired concentration/indecisiveness: no Suicidal ideations: no  Crying spells: no Recent Stressors/Life Changes: no   Relationship problems: no   Family stress: no     Financial stress: no    Job stress: no    Recent death/loss: no    Oct 29, 2023    3:08 PM 09/26/2023    4:00 PM 08/11/2023    3:18 PM 06/12/2023    1:14 PM  GAD 7 : Generalized Anxiety Score  Nervous, Anxious, on Edge 2 1 0 0  Control/stop worrying 1 1 1  0  Worry too much - different things 1 1 1 1   Trouble relaxing 1 1 0 0  Restless 1 1 0 0  Easily annoyed or irritable 1 1 1  0  Afraid - awful might happen 1 2 1  0  Total GAD 7 Score 8 8 4 1   Anxiety Difficulty Somewhat difficult Somewhat difficult Somewhat difficult    Relevant past medical, surgical, family and social history  reviewed and updated as indicated. Interim medical history since our last visit reviewed. Allergies and medications reviewed and updated.  Review of Systems  Constitutional:  Negative for activity change, diaphoresis, fatigue and fever.  Respiratory:  Negative for cough, chest tightness, shortness of breath and wheezing.   Cardiovascular:  Negative for chest pain, palpitations and leg swelling.  Gastrointestinal: Negative.   Endocrine: Negative for cold intolerance, heat intolerance, polydipsia, polyphagia and polyuria.  Neurological: Negative.   Psychiatric/Behavioral:  Positive for sleep disturbance. Negative for decreased concentration, self-injury and suicidal ideas. The patient is nervous/anxious.    Per HPI unless specifically indicated above     Objective:    BP (!) 102/56   Pulse 76   Temp 97.6 F (36.4 C) (Oral)   Ht 5\' 10"  (1.778 m)   Wt 195 lb 12.8 oz  (88.8 kg)   SpO2 97%   BMI 28.09 kg/m   Wt Readings from Last 3 Encounters:  01/17/24 195 lb 12.8 oz (88.8 kg)  01/02/24 196 lb 12.8 oz (89.3 kg)  12/14/23 200 lb (90.7 kg)    Physical Exam Vitals and nursing note reviewed.  Constitutional:      General: He is awake. He is not in acute distress.    Appearance: Normal appearance. He is well-developed and well-groomed. He is not ill-appearing or toxic-appearing.  HENT:     Head: Normocephalic.     Right Ear: Hearing and external ear normal.     Left Ear: Hearing and external ear normal.  Eyes:     General: Lids are normal.     Extraocular Movements: Extraocular movements intact.     Conjunctiva/sclera: Conjunctivae normal.  Neck:     Thyroid: No thyromegaly.     Vascular: No carotid bruit.  Cardiovascular:     Rate and Rhythm: Normal rate and regular rhythm.     Heart sounds: Normal heart sounds. No murmur heard.    No gallop.  Pulmonary:     Effort: No accessory muscle usage or respiratory distress.     Breath sounds: Normal breath sounds.  Abdominal:     General: Bowel sounds are normal. There is no distension.     Palpations: Abdomen is soft. There is no hepatomegaly or mass.     Tenderness: There is no abdominal tenderness. There is no right CVA tenderness or left CVA tenderness.  Musculoskeletal:     Cervical back: Full passive range of motion without pain.     Right lower leg: No edema.     Left lower leg: No edema.  Lymphadenopathy:     Cervical: No cervical adenopathy.  Skin:    General: Skin is warm.     Capillary Refill: Capillary refill takes less than 2 seconds.  Neurological:     Mental Status: He is alert and oriented to person, place, and time.     Deep Tendon Reflexes: Reflexes are normal and symmetric.     Reflex Scores:      Brachioradialis reflexes are 2+ on the right side and 2+ on the left side.      Patellar reflexes are 2+ on the right side and 2+ on the left side. Psychiatric:        Attention  and Perception: Attention normal.        Mood and Affect: Mood normal.        Speech: Speech normal.        Behavior: Behavior normal. Behavior is cooperative.  Thought Content: Thought content normal.    Results for orders placed or performed in visit on 01/02/24  T4, free   Collection Time: 01/02/24  4:38 PM  Result Value Ref Range   Free T4 2.08 (H) 0.82 - 1.77 ng/dL  TSH   Collection Time: 01/02/24  4:38 PM  Result Value Ref Range   TSH 2.510 0.450 - 4.500 uIU/mL      Assessment & Plan:   Problem List Items Addressed This Visit       Digestive   Slow transit constipation   Improving at this time with Linzess.  Suspect some of constipation related to Amidarone.  Will continue Linzess at this time since benefit is present and is passing bowels without issue.        Other   Anxiety   Refer to depression plan of care.      Relevant Medications   venlafaxine XR (EFFEXOR XR) 37.5 MG 24 hr capsule   Depression - Primary   Chronic, improving.  Suspect some element of PTSD due to recent cardiac arrest and being brought back to life.  Can not take multiple medications for mood due to Amiodarone.  Continue Wellbutrin as ordered (max dose), Buspar 20 MG TID (max dose) + Effexor 37.5 MG daily.  Denies SI/HI.  Monitor Wellbutrin and Toprol XL use -- discussed may need to consider mood medication adjustments in future.  Has not attended therapy, is thinking about this.      Relevant Medications   venlafaxine XR (EFFEXOR XR) 37.5 MG 24 hr capsule   Follow up plan: Return in about 16 weeks (around 05/08/2024) for HTN/HLD, THYROID.

## 2024-01-17 NOTE — Assessment & Plan Note (Signed)
 Refer to depression plan of care.

## 2024-01-17 NOTE — Assessment & Plan Note (Signed)
 Improving at this time with Linzess.  Suspect some of constipation related to Amidarone.  Will continue Linzess at this time since benefit is present and is passing bowels without issue.

## 2024-01-17 NOTE — Assessment & Plan Note (Signed)
 Chronic, improving.  Suspect some element of PTSD due to recent cardiac arrest and being brought back to life.  Can not take multiple medications for mood due to Amiodarone.  Continue Wellbutrin as ordered (max dose), Buspar 20 MG TID (max dose) + Effexor 37.5 MG daily.  Denies SI/HI.  Monitor Wellbutrin and Toprol XL use -- discussed may need to consider mood medication adjustments in future.  Has not attended therapy, is thinking about this.

## 2024-01-19 ENCOUNTER — Encounter: Payer: Self-pay | Admitting: Nurse Practitioner

## 2024-01-20 ENCOUNTER — Other Ambulatory Visit: Payer: Self-pay | Admitting: Cardiovascular Disease

## 2024-01-27 ENCOUNTER — Other Ambulatory Visit: Payer: Self-pay | Admitting: Nurse Practitioner

## 2024-01-29 NOTE — Telephone Encounter (Signed)
 Requested Prescriptions  Pending Prescriptions Disp Refills   buPROPion (WELLBUTRIN SR) 200 MG 12 hr tablet [Pharmacy Med Name: BUPROPION HCL SR 200 MG TABLET] 180 tablet 1    Sig: Take 1 tablet (200 mg total) by mouth 2 (two) times daily.     Psychiatry: Antidepressants - bupropion Failed - 01/29/2024  2:04 PM      Failed - Cr in normal range and within 360 days    Creatinine  Date Value Ref Range Status  02/20/2015 1.37 (H) mg/dL Final    Comment:    2.72-5.36 NOTE: New Reference Range  01/20/15    Creatinine, Ser  Date Value Ref Range Status  10/11/2023 1.54 (H) 0.76 - 1.27 mg/dL Final         Passed - AST in normal range and within 360 days    AST  Date Value Ref Range Status  10/11/2023 36 0 - 40 IU/L Final   SGOT(AST)  Date Value Ref Range Status  12/13/2014 30 15 - 37 Unit/L Final         Passed - ALT in normal range and within 360 days    ALT  Date Value Ref Range Status  10/11/2023 19 0 - 44 IU/L Final   SGPT (ALT)  Date Value Ref Range Status  12/13/2014 20 14 - 63 U/L Final         Passed - Completed PHQ-2 or PHQ-9 in the last 360 days      Passed - Last BP in normal range    BP Readings from Last 1 Encounters:  01/17/24 (!) 102/56         Passed - Valid encounter within last 6 months    Recent Outpatient Visits           2 months ago Major depressive disorder with single episode, in partial remission (HCC)   [place] [facility] [place], [place] T, NP   3 months ago Major depressive disorder with single episode, in partial remission (HCC)   [place] [facility] [place], [place] T, NP   4 months ago Major depressive disorder with single episode, in partial remission (HCC)   [place] [facility] [place], Corrie Dandy T, NP   5 months ago Medicare annual wellness visit, subsequent   [place] [facility] [address] [place], [place] T, NP   7 months ago Cardiac arrest [facility])   [place] [facility] [place], Dorie Rank, NP

## 2024-01-30 ENCOUNTER — Encounter: Payer: Self-pay | Admitting: Nurse Practitioner

## 2024-02-01 ENCOUNTER — Encounter: Payer: Self-pay | Admitting: Nurse Practitioner

## 2024-02-01 ENCOUNTER — Telehealth (INDEPENDENT_AMBULATORY_CARE_PROVIDER_SITE_OTHER): Admitting: Nurse Practitioner

## 2024-02-01 VITALS — BP 118/78 | Wt 193.0 lb

## 2024-02-01 DIAGNOSIS — G4700 Insomnia, unspecified: Secondary | ICD-10-CM | POA: Insufficient documentation

## 2024-02-01 DIAGNOSIS — K5901 Slow transit constipation: Secondary | ICD-10-CM | POA: Diagnosis not present

## 2024-02-01 DIAGNOSIS — F5104 Psychophysiologic insomnia: Secondary | ICD-10-CM

## 2024-02-01 MED ORDER — ESZOPICLONE 1 MG PO TABS: 1.0000 mg | ORAL_TABLET | Freq: Every evening | ORAL | 0 refills | Status: DC | PRN

## 2024-02-01 NOTE — Progress Notes (Signed)
 BP 118/78 (BP Location: Left Arm, Patient Position: Sitting, Cuff Size: Normal)   Wt 193 lb (87.5 kg)   BMI 27.69 kg/m    Subjective:    Patient ID: Stuart Harvey, male    DOB: 1951-07-01, 73 y.o.   MRN: [id number]  HPI: CASHMERE HARMES is a 73 y.o. male  Chief Complaint  Patient presents with   Insomnia    Follow up, ongoing for 6 weeks.    GI Problem    On Linzess. Pain until he can have a BM. Bothers him real bad. Once daily BM, some straining during.    Virtual Visit via Video Note  I connected with Stuart Harvey on 02/01/24 at 11:20 AM EDT by a video enabled telemedicine application and verified that I am speaking with the correct person using two identifiers.  Location: Patient: home Provider: work   I discussed the limitations of evaluation and management by telemedicine and the availability of in person appointments. The patient expressed understanding and agreed to proceed.  I discussed the assessment and treatment plan with the patient. The patient was provided an opportunity to ask questions and all were answered. The patient agreed with the plan and demonstrated an understanding of the instructions.   The patient was advised to call back or seek an in-person evaluation if the symptoms worsen or if the condition fails to improve as anticipated.  I provided 25 minutes of non-face-to-face time during this encounter.   Marjie Skiff, NP   INSOMNIA & CONSTIPATION Currently taking Effexor, Wellbutrin, and Buspar for mood.  Tried to switch to taking second dose of Wellbutrin earlier in day but still not sleeping well.  Has struggled with sleep and mood since cardiac arrest months back.  Had to come off Celexa due to interactions. Feels tired when goes to bed -- finally goes to sleep at 3 to 4 am.  Tried Melatonin and it is not working.  On Linzess for constipation, at visit on 01/17/24 he reported this had improved with current 145 MCG dosing. Duration:  months Satisfied with sleep quality: no Difficulty falling asleep: yes Difficulty staying asleep: yes Waking a few hours after sleep onset: yes Early morning awakenings: no Daytime hypersomnolence: no Wakes feeling refreshed: yes Good sleep hygiene: yes Apnea: no Snoring: no Depressed/anxious mood: yes Recent stress: yes Restless legs/nocturnal leg cramps: no Chronic pain/arthritis: yes at baseline History of sleep study: no Treatments attempted: melatonin      02/01/2024   11:30 AM 11/14/2023   11:19 AM 10/24/2023    3:08 PM 09/26/2023    3:59 PM 08/11/2023    3:17 PM  Depression screen PHQ 2/9  Decreased Interest 1 1 2  0 0  Down, Depressed, Hopeless 1 1 2 1  0  PHQ - 2 Score 2 2 4 1  0  Altered sleeping 3 1 2 2 1   Tired, decreased energy 1 1 2 2 1   Change in appetite 1 0 1 1 0  Feeling bad or failure about yourself  0 0 0 0 0  Trouble concentrating 1 1 2 1  0  Moving slowly or fidgety/restless 0 0 1 1 0  Suicidal thoughts 0 0 0 0 0  PHQ-9 Score 8 5 12 8 2   Difficult doing work/chores  Not difficult at all Somewhat difficult Somewhat difficult Not difficult at all       02/01/2024   11:31 AM 10/24/2023    3:08 PM 09/26/2023    4:00 PM 08/11/2023  3:18 PM  GAD 7 : Generalized Anxiety Score  Nervous, Anxious, on Edge 1 2 1  0  Control/stop worrying 1 1 1 1   Worry too much - different things 2 1 1 1   Trouble relaxing 1 1 1  0  Restless 1 1 1  0  Easily annoyed or irritable 1 1 1 1   Afraid - awful might happen 1 1 2 1   Total GAD 7 Score 8 8 8 4   Anxiety Difficulty Somewhat difficult Somewhat difficult Somewhat difficult Somewhat difficult   Relevant past medical, surgical, family and social history reviewed and updated as indicated. Interim medical history since our last visit reviewed. Allergies and medications reviewed and updated.  Review of Systems  Constitutional:  Negative for activity change, diaphoresis, fatigue and fever.  Respiratory:  Negative for cough,  chest tightness, shortness of breath and wheezing.   Cardiovascular:  Negative for chest pain, palpitations and leg swelling.  Gastrointestinal:  Positive for constipation. Negative for abdominal distention, abdominal pain, blood in stool, diarrhea, nausea and vomiting.  Endocrine: Negative for cold intolerance, heat intolerance, polydipsia, polyphagia and polyuria.  Neurological: Negative.   Psychiatric/Behavioral:  Positive for sleep disturbance. Negative for decreased concentration, self-injury and suicidal ideas. The patient is nervous/anxious.    Per HPI unless specifically indicated above     Objective:    BP 118/78 (BP Location: Left Arm, Patient Position: Sitting, Cuff Size: Normal)   Wt 193 lb (87.5 kg)   BMI 27.69 kg/m   Wt Readings from Last 3 Encounters:  02/01/24 193 lb (87.5 kg)  01/17/24 195 lb 12.8 oz (88.8 kg)  01/02/24 196 lb 12.8 oz (89.3 kg)    Physical Exam Vitals and nursing note reviewed.  Constitutional:      General: He is awake. He is not in acute distress.    Appearance: He is well-developed. He is not ill-appearing.  HENT:     Head: Normocephalic.     Right Ear: Hearing normal. No drainage.     Left Ear: Hearing normal. No drainage.  Eyes:     General: Lids are normal.        Right eye: No discharge.        Left eye: No discharge.     Conjunctiva/sclera: Conjunctivae normal.  Pulmonary:     Effort: Pulmonary effort is normal. No accessory muscle usage or respiratory distress.  Musculoskeletal:     Cervical back: Normal range of motion.  Neurological:     Mental Status: He is alert and oriented to person, place, and time.  Psychiatric:        Mood and Affect: Mood normal.        Behavior: Behavior normal. Behavior is cooperative.        Thought Content: Thought content normal.        Judgment: Judgment normal.    Results for orders placed or performed in visit on 01/02/24  T4, free   Collection Time: 01/02/24  4:38 PM  Result Value Ref  Range   Free T4 2.08 (H) 0.82 - 1.77 ng/dL  TSH   Collection Time: 01/02/24  4:38 PM  Result Value Ref Range   TSH 2.510 0.450 - 4.500 uIU/mL      Assessment & Plan:   Problem List Items Addressed This Visit       Digestive   Slow transit constipation - Primary   Chronic, is improving.  Having BM daily, but still occasional straining and discomfort.  Suspect some of constipation related to  Amidarone.  Will continue Linzess, but recommend he trial taking 290 MCG (two 145 MCG capsule -- they just picked up a supply of these).  This would be max dose.  He will alert provider via MyChart if improves bowels or if causes too much diarrhea -- may adjust dose if improvement noted.  Goal is to not push to diarrhea stools daily, but to prevent straining.        Other   Insomnia   Ongoing. Has tried Melatonin and adjusting Wellbutrin timing in afternoon with no benefit.  Using good sleep techniques. Would benefit Belsomra due to age, but not covered by insurance.  Trazodone interacts with Amiodarone.  Would avoid Mirtazapine due to risk for interactions.  DayVigo interacts with Amiodarone and Quviviq interacts with Effexor.  Will see if can get Lunesta coverage and monitor use closely.  Plan to use for shortest period possible.  Would benefit therapy time which may help sleep pattern or sleep study.        Follow up plan: Return in about 4 weeks (around 02/29/2024) for Constipation and Insomnia.

## 2024-02-01 NOTE — Assessment & Plan Note (Signed)
 Chronic, is improving.  Having BM daily, but still occasional straining and discomfort.  Suspect some of constipation related to Amidarone.  Will continue Linzess, but recommend Stuart Harvey trial taking 290 MCG (two 145 MCG capsule -- they just picked up a supply of these).  This would be max dose.  Stuart Harvey will alert provider via MyChart if improves bowels or if causes too much diarrhea -- may adjust dose if improvement noted.  Goal is to not push to diarrhea stools daily, but to prevent straining.

## 2024-02-01 NOTE — Patient Instructions (Signed)
 Insomnia Insomnia is a sleep disorder that makes it difficult to fall asleep or stay asleep. Insomnia can cause fatigue, low energy, difficulty concentrating, mood swings, and poor performance at work or school. There are three different ways to classify insomnia: Difficulty falling asleep. Difficulty staying asleep. Waking up too early in the morning. Any type of insomnia can be long-term (chronic) or short-term (acute). Both are common. Short-term insomnia usually lasts for 3 months or less. Chronic insomnia occurs at least three times a week for longer than 3 months. What are the causes? Insomnia may be caused by another condition, situation, or substance, such as: Having certain mental health conditions, such as anxiety and depression. Using caffeine, alcohol, tobacco, or drugs. Having gastrointestinal conditions, such as gastroesophageal reflux disease (GERD). Having certain medical conditions. These include: Asthma. Alzheimer's disease. Stroke. Chronic pain. An overactive thyroid gland (hyperthyroidism). Other sleep disorders, such as restless legs syndrome and sleep apnea. Menopause. Sometimes, the cause of insomnia may not be known. What increases the risk? Risk factors for insomnia include: Gender. Females are affected more often than males. Age. Insomnia is more common as people get older. Stress and certain medical and mental health conditions. Lack of exercise. Having an irregular work schedule. This may include working night shifts and traveling between different time zones. What are the signs or symptoms? If you have insomnia, the main symptom is having trouble falling asleep or having trouble staying asleep. This may lead to other symptoms, such as: Feeling tired or having low energy. Feeling nervous about going to sleep. Not feeling rested in the morning. Having trouble concentrating. Feeling irritable, anxious, or depressed. How is this diagnosed? This condition  may be diagnosed based on: Your symptoms and medical history. Your health care provider may ask about: Your sleep habits. Any medical conditions you have. Your mental health. A physical exam. How is this treated? Treatment for insomnia depends on the cause. Treatment may focus on treating an underlying condition that is causing the insomnia. Treatment may also include: Medicines to help you sleep. Counseling or therapy. Lifestyle adjustments to help you sleep better. Follow these instructions at home: Eating and drinking  Limit or avoid alcohol, caffeinated beverages, and products that contain nicotine and tobacco, especially close to bedtime. These can disrupt your sleep. Do not eat a large meal or eat spicy foods right before bedtime. This can lead to digestive discomfort that can make it hard for you to sleep. Sleep habits  Keep a sleep diary to help you and your health care provider figure out what could be causing your insomnia. Write down: When you sleep. When you wake up during the night. How well you sleep and how rested you feel the next day. Any side effects of medicines you are taking. What you eat and drink. Make your bedroom a dark, comfortable place where it is easy to fall asleep. Put up shades or blackout curtains to block light from outside. Use a white noise machine to block noise. Keep the temperature cool. Limit screen use before bedtime. This includes: Not watching TV. Not using your smartphone, tablet, or computer. Stick to a routine that includes going to bed and waking up at the same times every day and night. This can help you fall asleep faster. Consider making a quiet activity, such as reading, part of your nighttime routine. Try to avoid taking naps during the day so that you sleep better at night. Get out of bed if you are still awake after  15 minutes of trying to sleep. Keep the lights down, but try reading or doing a quiet activity. When you feel  sleepy, go back to bed. General instructions Take over-the-counter and prescription medicines only as told by your health care provider. Exercise regularly as told by your health care provider. However, avoid exercising in the hours right before bedtime. Use relaxation techniques to manage stress. Ask your health care provider to suggest some techniques that may work well for you. These may include: Breathing exercises. Routines to release muscle tension. Visualizing peaceful scenes. Make sure that you drive carefully. Do not drive if you feel very sleepy. Keep all follow-up visits. This is important. Contact a health care provider if: You are tired throughout the day. You have trouble in your daily routine due to sleepiness. You continue to have sleep problems, or your sleep problems get worse. Get help right away if: You have thoughts about hurting yourself or someone else. Get help right away if you feel like you may hurt yourself or others, or have thoughts about taking your own life. Go to your nearest emergency room or: Call 911. Call the National Suicide Prevention Lifeline at (906)021-1611 or 988. This is open 24 hours a day. Text the Crisis Text Line at 325-069-2793. Summary Insomnia is a sleep disorder that makes it difficult to fall asleep or stay asleep. Insomnia can be long-term (chronic) or short-term (acute). Treatment for insomnia depends on the cause. Treatment may focus on treating an underlying condition that is causing the insomnia. Keep a sleep diary to help you and your health care provider figure out what could be causing your insomnia. This information is not intended to replace advice given to you by your health care provider. Make sure you discuss any questions you have with your health care provider. Document Revised: 10/11/2021 Document Reviewed: 10/11/2021 Elsevier Patient Education  2024 [employer].

## 2024-02-01 NOTE — Assessment & Plan Note (Addendum)
 Ongoing. Has tried Melatonin and adjusting Wellbutrin timing in afternoon with no benefit.  Using good sleep techniques. Would benefit Belsomra due to age, but not covered by insurance.  Trazodone interacts with Amiodarone.  Would avoid Mirtazapine due to risk for interactions.  DayVigo interacts with Amiodarone and Quviviq interacts with Effexor.  Will see if can get Lunesta coverage and monitor use closely.  Plan to use for shortest period possible.  Would benefit therapy time which may help sleep pattern or sleep study.

## 2024-02-02 NOTE — Telephone Encounter (Signed)
 Patient had appt on 01-31-34

## 2024-02-02 NOTE — Progress Notes (Signed)
 Appt has been made.

## 2024-02-08 ENCOUNTER — Ambulatory Visit: Payer: Self-pay | Admitting: Nurse Practitioner

## 2024-02-08 ENCOUNTER — Encounter: Payer: Self-pay | Admitting: Nurse Practitioner

## 2024-02-08 NOTE — Progress Notes (Signed)
 Remote ICD transmission.

## 2024-02-10 ENCOUNTER — Encounter: Payer: Self-pay | Admitting: Nurse Practitioner

## 2024-02-12 MED ORDER — LINACLOTIDE 290 MCG PO CAPS: 290.0000 ug | ORAL_CAPSULE | Freq: Every day | ORAL | 4 refills | Status: DC

## 2024-02-13 NOTE — Progress Notes (Unsigned)
 Electrophysiology Clinic Note    Date:  02/14/2024  Patient ID:  Stuart Harvey, [place] 02-19-1951, MRN [id number] PCP:  Marjie Skiff, NP  Cardiologist:  [name], MD Electrophysiologist: Lanier Prude, MD    Discussed the use of AI scribe software for clinical note transcription with the patient, who gave verbal consent to proceed.   Patient Profile    Chief Complaint: medication concerns  History of Present Illness: Stuart Harvey is a 73 y.o. male with PMH notable for CAD s/p CABG (2008), HFrEF, ICM, recurrent syncope with inducible VT s/p ICD, parox AFib, HTN; seen today for Lanier Prude, MD for acute visit due to medication concerns.   He had unexplained syncope 05/2023 where wife performed CPR. He was in afib throughout the hospitalization so unclear whether vagally-mediated iso of aFib or arrhythmogenic cause.  Given his CAD history, EPS was recommended with inducible VT. He had ICD implanted 06/2023  He last saw Dr. Lalla Brothers 09/2023 for routine 91-d implant follow-up. He was having difficulties sleeping and with constipation. Amiodarone was 200mg  at that time. He saw NP Hammock in follow up 11/2023 with c/o ongoing constipation and requested to further reduce amiodarone, and it was lowered to 100mg  daily.  His wife messaged clinic earlier this week with ongoing concerns for over-medication. He is miserable with his current GI symptoms. His main concern is constipation, which his PCP has rx'd linzess which has now resulted in diarrhea. He has constant abd pain with eating and is concerned about continued weight loss. He is intermittently dizzy. He is scheduled to see GI soon.   He has continued to take amiodarone 100mg  daily. Continues to take 5mg  eliquis BID, no bleeding concerns.  He checks BP sporadically, recalls systolic readings 90-130s  He denies chest pain, chest pressure, palpitations. No further episodes of syncope.     Arrhythmia/Device  History MDT dual chamber ICD, imp 06/2023; dx VT  Amiodarone - loaded 05/2023 after VT, AFib      ROS:  Please see the history of present illness. All other systems are reviewed and otherwise negative.    Physical Exam    VS:  BP 100/68 (BP Location: Left Arm, Patient Position: Sitting, Cuff Size: Normal)   Pulse 82   Ht 5\' 11"  (1.803 m)   Wt 189 lb 6 oz (85.9 kg)   SpO2 97%   BMI 26.41 kg/m  BMI: Body mass index is 26.41 kg/m.  Wt Readings from Last 3 Encounters:  02/14/24 189 lb 6 oz (85.9 kg)  02/01/24 193 lb (87.5 kg)  01/17/24 195 lb 12.8 oz (88.8 kg)     GEN- The patient is well appearing, alert and oriented x 3 today.   Lungs- Clear to ausculation bilaterally, normal work of breathing.  Heart- Regular rate and rhythm, no murmurs, rubs or gallops Extremities- No peripheral edema, warm, dry Skin-  device pocket well-healed, no tethering   Device interrogation done today and reviewed by myself:  Battery 12.2 years Lead thresholds, impedence, sensing stable  No AFib, no VT episodes Rare VP No changes made today   Studies Reviewed   Previous EP, cardiology notes.    EKG is not ordered. Personal review of EKG from  12/14/2023  shows:  SR at 85bpm, LAD, incomplete LBBB      Cardiac MRI, 05/23/2023 1.  Moderately reduced LV systolic function.  LVEF = 38%  2.  Lateral wall LV Basal-mid subendocardial scar/LGE.  3.  LGE/scar  accounts for 12.77g (11%) of total myocardial mass.  4.  Normal RV function.  5.  Findings consistent with prior LV infarct.  6.  Ischemic cardiomyopathy.  TTE, 05/19/2023 1. Left ventricular ejection fraction, by estimation, is 40 to 45%. The left ventricle has mildly decreased function. The left ventricle demonstrates regional wall motion abnormalities (inferolateral wall hypokinesis). There is moderate asymmetric left ventricular hypertrophy of the septal segment. Left ventricular diastolic parameters are consistent with Grade I diastolic  dysfunction (impaired relaxation).   2. Right ventricular systolic function is normal. The right ventricular size is normal. There is normal pulmonary artery systolic pressure. The estimated right ventricular systolic pressure is 29.0 mmHg.   3. The mitral valve is normal in structure. No evidence of mitral valve regurgitation. No evidence of mitral stenosis.   4. Tricuspid valve regurgitation is moderate.   5. The aortic valve is normal in structure. Aortic valve regurgitation is not visualized. No aortic stenosis is present.   6. There is borderline dilatation of the aortic root, measuring 40 mm.   7. The inferior vena cava is normal in size with greater than 50% respiratory variability, suggesting right atrial pressure of 3 mmHg.     Assessment and Plan     #) parox afib #) VT #) Amiodarone monitoring #) nausea, abd pain, constipation #) ICD in situ S/p EPS with inducible VT Atrial and ventricular arrhythmias quiescent on amiodarone Discussed rationale for prescribing amiodarone, patient is adamant about amiodarone being the cause of his ongoing GI complaints Will stop amiodarone at this time with close follow-up  #) Hypercoag d/t parox afib CHA2DS2-VASc Score = at least 4 [CHF History: 1, HTN History: 1, Diabetes History: 0, Stroke History: 0, Vascular Disease History: 1, Age Score: 1, Gender Score: 0].  Therefore, the patient's annual risk of stroke is 4.8 %.    Stroke ppx - 5mg  eliquis BID, appropriately dosed No bleeding concerns  #) depression, anxiety #) ?PTSD Patient endorses significant anxiety regarding his cardiac diagnosis Strongly recommended he discuss with psychiatrist, patient will consider this Refer to Napoleon Form at [facility]        Current medicines are reviewed at length with the patient today.   The patient has concerns regarding his medicines.  The following changes were made today:   STOP amiodarone  Labs/ tests ordered today include:  Orders Placed  This Encounter  Procedures   Ambulatory referral to Psychology     Disposition: Follow up with EP APP or EP APP in 3 months   Signed, Sherie Don, NP  02/14/24  2:30 PM  Electrophysiology [facility]

## 2024-02-14 ENCOUNTER — Ambulatory Visit: Attending: Cardiology | Admitting: Cardiology

## 2024-02-14 ENCOUNTER — Encounter: Payer: Self-pay | Admitting: Cardiology

## 2024-02-14 VITALS — BP 100/68 | HR 82 | Ht 71.0 in | Wt 189.4 lb

## 2024-02-14 DIAGNOSIS — I480 Paroxysmal atrial fibrillation: Secondary | ICD-10-CM | POA: Diagnosis not present

## 2024-02-14 DIAGNOSIS — Z95810 Presence of automatic (implantable) cardiac defibrillator: Secondary | ICD-10-CM | POA: Diagnosis not present

## 2024-02-14 DIAGNOSIS — F419 Anxiety disorder, unspecified: Secondary | ICD-10-CM

## 2024-02-14 DIAGNOSIS — G4709 Other insomnia: Secondary | ICD-10-CM

## 2024-02-14 DIAGNOSIS — I472 Ventricular tachycardia, unspecified: Secondary | ICD-10-CM | POA: Diagnosis not present

## 2024-02-14 DIAGNOSIS — F3289 Other specified depressive episodes: Secondary | ICD-10-CM | POA: Diagnosis not present

## 2024-02-14 DIAGNOSIS — I255 Ischemic cardiomyopathy: Secondary | ICD-10-CM

## 2024-02-14 DIAGNOSIS — I5020 Unspecified systolic (congestive) heart failure: Secondary | ICD-10-CM

## 2024-02-14 NOTE — Patient Instructions (Addendum)
 Medication Instructions:  Your physician recommends the following medication changes.  STOP TAKING: Amiodarone   *If you need a refill on your cardiac medications before your next appointment, please call your pharmacy*  Lab Work: None ordered at this time   Follow-Up: At [facility], you and your health needs are our priority.  As part of our continuing mission to provide you with exceptional heart care, our providers are all part of one team.  This team includes your primary Cardiologist (physician) and Advanced Practice Providers or APPs (Physician Assistants and Nurse Practitioners) who all work together to provide you with the care you need, when you need it.  Your next appointment:   3 month(s)  Provider:   Sherie Don, NP    Other Instructions Cancel appointment with Charlsie Quest   Referral to Napoleon Form, PhD at [facility]: [id number] [place], [place] [address]  [facility] at [facility]: [address] [place], [place] [address] [phone]

## 2024-02-16 NOTE — Telephone Encounter (Signed)
 Copied from CRM [id number]. Topic: Clinical - Medication Question >> Feb 16, 2024  2:23 PM Turkey B wrote: Reason for CRM: pt called in about which mg he should be taking of linaclotide (LINZESS) 290 MCG CAPS capsule. Please cb

## 2024-02-19 ENCOUNTER — Encounter: Payer: Self-pay | Admitting: Nurse Practitioner

## 2024-02-19 MED ORDER — LINACLOTIDE 145 MCG PO CAPS: 145.0000 ug | ORAL_CAPSULE | Freq: Every day | ORAL | 12 refills | Status: DC

## 2024-02-20 ENCOUNTER — Ambulatory Visit: Admitting: Cardiology

## 2024-02-29 ENCOUNTER — Other Ambulatory Visit: Payer: Self-pay | Admitting: Nurse Practitioner

## 2024-03-01 NOTE — Telephone Encounter (Signed)
 Requested medication (s) are due for refill today - yes  Requested medication (s) are on the active medication list -yes  Future visit scheduled -yes  Last refill: 02/01/24 #30   Notes to clinic: non delegated Rx  Requested Prescriptions  Pending Prescriptions Disp Refills   eszopiclone  (LUNESTA ) 1 MG TABS tablet [Pharmacy Med Name: ESZOPICLONE  1 MG TABLET] 30 tablet 0    Sig: Take 1 tablet (1 mg total) by mouth at bedtime as needed for sleep. Take immediately before bedtime     Not Delegated - Psychiatry:  Anxiolytics/Hypnotics Failed - 03/01/2024  8:34 AM      Failed - This refill cannot be delegated      Passed - Urine Drug Screen completed in last 360 days      Passed - Valid encounter within last 6 months    Recent Outpatient Visits           4 weeks ago Slow transit constipation   [place] [facility] [place], [place] T, NP   1 month ago Major depressive disorder with single episode, in partial remission (HCC)   [place] [facility] [place], [place] T, NP   1 month ago Major depressive disorder with single episode, in partial remission (HCC)   [place] [facility] [place], Sanjuan Crumbly T, NP       Future Appointments             In 2 months Riddle, Suzann, NP [facility] [employer] Prescriptions  Pending Prescriptions Disp Refills   eszopiclone  (LUNESTA ) 1 MG TABS tablet [Pharmacy Med Name: ESZOPICLONE  1 MG TABLET] 30 tablet 0    Sig: Take 1 tablet (1 mg total) by mouth at bedtime as needed for sleep. Take immediately before bedtime     Not Delegated - Psychiatry:  Anxiolytics/Hypnotics Failed - 03/01/2024  8:34 AM      Failed - This refill cannot be delegated      Passed - Urine Drug Screen completed in last 360 days      Passed - Valid encounter within last 6 months    Recent Outpatient Visits           4 weeks ago Slow transit constipation   [place] [facility] [place], [place] T, NP   1 month ago Major depressive disorder with single episode, in partial remission (HCC)   [facility] [place], [place] T, NP   1 month ago Major depressive disorder with single episode, in partial remission (HCC)   [place] [facility] Lemar Pyles, NP       Future Appointments             In 2 months Riddle, Suzann, NP [facility]

## 2024-03-02 NOTE — Patient Instructions (Signed)
 Be Involved in Caring For Your Health:  Taking Medications When medications are taken as directed, they can greatly improve your health. But if they are not taken as prescribed, they may not work. In some cases, not taking them correctly can be harmful. To help ensure your treatment remains effective and safe, understand your medications and how to take them. Bring your medications to each visit for review by your provider.  Your lab results, notes, and after visit summary will be available on My Chart. We strongly encourage you to use this feature. If lab results are abnormal the clinic will contact you with the appropriate steps. If the clinic does not contact you assume the results are satisfactory. You can always view your results on My Chart. If you have questions regarding your health or results, please contact the clinic during office hours. You can also ask questions on My Chart.  We at [facility] are grateful that you chose [place] to provide your care. We strive to provide evidence-based and compassionate care and are always looking for feedback. If you get a survey from the clinic please complete this so we can hear your opinions.  Insomnia Insomnia is a sleep disorder that makes it difficult to fall asleep or stay asleep. Insomnia can cause fatigue, low energy, difficulty concentrating, mood swings, and poor performance at work or school. There are three different ways to classify insomnia: Difficulty falling asleep. Difficulty staying asleep. Waking up too early in the morning. Any type of insomnia can be long-term (chronic) or short-term (acute). Both are common. Short-term insomnia usually lasts for 3 months or less. Chronic insomnia occurs at least three times a week for longer than 3 months. What are the causes? Insomnia may be caused by another condition, situation, or substance, such as: Having certain mental health conditions, such as anxiety and depression. Using  caffeine, alcohol, tobacco, or drugs. Having gastrointestinal conditions, such as gastroesophageal reflux disease (GERD). Having certain medical conditions. These include: Asthma. Alzheimer's disease. Stroke. Chronic pain. An overactive thyroid gland (hyperthyroidism). Other sleep disorders, such as restless legs syndrome and sleep apnea. Menopause. Sometimes, the cause of insomnia may not be known. What increases the risk? Risk factors for insomnia include: Gender. Females are affected more often than males. Age. Insomnia is more common as people get older. Stress and certain medical and mental health conditions. Lack of exercise. Having an irregular work schedule. This may include working night shifts and traveling between different time zones. What are the signs or symptoms? If you have insomnia, the main symptom is having trouble falling asleep or having trouble staying asleep. This may lead to other symptoms, such as: Feeling tired or having low energy. Feeling nervous about going to sleep. Not feeling rested in the morning. Having trouble concentrating. Feeling irritable, anxious, or depressed. How is this diagnosed? This condition may be diagnosed based on: Your symptoms and medical history. Your health care provider may ask about: Your sleep habits. Any medical conditions you have. Your mental health. A physical exam. How is this treated? Treatment for insomnia depends on the cause. Treatment may focus on treating an underlying condition that is causing the insomnia. Treatment may also include: Medicines to help you sleep. Counseling or therapy. Lifestyle adjustments to help you sleep better. Follow these instructions at home: Eating and drinking  Limit or avoid alcohol, caffeinated beverages, and products that contain nicotine and tobacco, especially close to bedtime. These can disrupt your sleep. Do not eat a large  meal or eat spicy foods right before bedtime. This  can lead to digestive discomfort that can make it hard for you to sleep. Sleep habits  Keep a sleep diary to help you and your health care provider figure out what could be causing your insomnia. Write down: When you sleep. When you wake up during the night. How well you sleep and how rested you feel the next day. Any side effects of medicines you are taking. What you eat and drink. Make your bedroom a dark, comfortable place where it is easy to fall asleep. Put up shades or blackout curtains to block light from outside. Use a white noise machine to block noise. Keep the temperature cool. Limit screen use before bedtime. This includes: Not watching TV. Not using your smartphone, tablet, or computer. Stick to a routine that includes going to bed and waking up at the same times every day and night. This can help you fall asleep faster. Consider making a quiet activity, such as reading, part of your nighttime routine. Try to avoid taking naps during the day so that you sleep better at night. Get out of bed if you are still awake after 15 minutes of trying to sleep. Keep the lights down, but try reading or doing a quiet activity. When you feel sleepy, go back to bed. General instructions Take over-the-counter and prescription medicines only as told by your health care provider. Exercise regularly as told by your health care provider. However, avoid exercising in the hours right before bedtime. Use relaxation techniques to manage stress. Ask your health care provider to suggest some techniques that may work well for you. These may include: Breathing exercises. Routines to release muscle tension. Visualizing peaceful scenes. Make sure that you drive carefully. Do not drive if you feel very sleepy. Keep all follow-up visits. This is important. Contact a health care provider if: You are tired throughout the day. You have trouble in your daily routine due to sleepiness. You continue to have  sleep problems, or your sleep problems get worse. Get help right away if: You have thoughts about hurting yourself or someone else. Get help right away if you feel like you may hurt yourself or others, or have thoughts about taking your own life. Go to your nearest emergency room or: Call 911. Call the National Suicide Prevention Lifeline at 512-196-3132 or 988. This is open 24 hours a day. Text the Crisis Text Line at 825-066-1380. Summary Insomnia is a sleep disorder that makes it difficult to fall asleep or stay asleep. Insomnia can be long-term (chronic) or short-term (acute). Treatment for insomnia depends on the cause. Treatment may focus on treating an underlying condition that is causing the insomnia. Keep a sleep diary to help you and your health care provider figure out what could be causing your insomnia. This information is not intended to replace advice given to you by your health care provider. Make sure you discuss any questions you have with your health care provider. Document Revised: 10/11/2021 Document Reviewed: 10/11/2021 Elsevier Patient Education  2024 [employer].

## 2024-03-05 ENCOUNTER — Encounter: Payer: Self-pay | Admitting: Nurse Practitioner

## 2024-03-05 ENCOUNTER — Telehealth (INDEPENDENT_AMBULATORY_CARE_PROVIDER_SITE_OTHER): Admitting: Nurse Practitioner

## 2024-03-05 VITALS — BP 113/69 | Wt 190.0 lb

## 2024-03-05 DIAGNOSIS — F324 Major depressive disorder, single episode, in partial remission: Secondary | ICD-10-CM

## 2024-03-05 DIAGNOSIS — F419 Anxiety disorder, unspecified: Secondary | ICD-10-CM

## 2024-03-05 DIAGNOSIS — K5901 Slow transit constipation: Secondary | ICD-10-CM | POA: Diagnosis not present

## 2024-03-05 DIAGNOSIS — F5104 Psychophysiologic insomnia: Secondary | ICD-10-CM

## 2024-03-05 DIAGNOSIS — G4700 Insomnia, unspecified: Secondary | ICD-10-CM | POA: Diagnosis not present

## 2024-03-05 MED ORDER — LINACLOTIDE 145 MCG PO CAPS: 145.0000 ug | ORAL_CAPSULE | Freq: Every day | ORAL | 12 refills | Status: DC

## 2024-03-05 NOTE — Assessment & Plan Note (Signed)
 Chronic, stable.  Suspect some element of PTSD due to recent cardiac arrest and being brought back to life.  Could not take multiple medications for mood due to Amiodarone , which was recently stopped.  Continue Wellbutrin  as ordered (max dose), Buspar  20 MG TID (max dose) + Effexor  37.5 MG daily.  Denies SI/HI.  Monitor Wellbutrin  and Toprol  XL use -- discussed may need to consider mood medication adjustments in future.  Would benefit therapy in future.  Since is stable at present will maintain regimen.

## 2024-03-05 NOTE — Progress Notes (Signed)
 BP 113/69   Wt 190 lb (86.2 kg)   BMI 26.50 kg/m    Subjective:    Patient ID: Stuart Harvey, male    DOB: September 19, 1951, 73 y.o.   MRN: [id number]  HPI: Stuart Harvey is a 73 y.o. male  Chief Complaint  Patient presents with   Constipation   Insomnia   Virtual Visit via Video Note  I connected with Stuart Harvey on 03/05/24 at  1:20 PM EDT by a video enabled telemedicine application and verified that I am speaking with the correct person using two identifiers.  Location: Patient: home Provider: work   I discussed the limitations of evaluation and management by telemedicine and the availability of in person appointments. The patient expressed understanding and agreed to proceed.  I discussed the assessment and treatment plan with the patient. The patient was provided an opportunity to ask questions and all were answered. The patient agreed with the plan and demonstrated an understanding of the instructions.   The patient was advised to call back or seek an in-person evaluation if the symptoms worsen or if the condition fails to improve as anticipated.  I provided 25 minutes of non-face-to-face time during this encounter.   Zaryan Yakubov T Delonda Coley, NP   INSOMNIA & CONSTIPATION Takes Effexor , Wellbutrin , and Buspar  for mood.  Has struggled with sleep and mood since cardiac arrest months back, although reports anxiety is improving at present.  Only gets anxious when having trouble having a BM.  Had to come off Celexa  due to interactions with Amiodarone .  At visit on 02/14/24 with cardiology they took him off Amiodarone  due to his concerns it was causing constipation.  On Linzess  for constipation, he reports this continues to help him have regular BM, although sometimes he will take two pills (290 MCG) to help with bowels a bit more. Duration: months Satisfied with sleep quality: no Difficulty falling asleep: improving with Lunesta , sometimes will take 2 of these Difficulty staying  asleep: as above Waking a few hours after sleep onset: as above Early morning awakenings: no Daytime hypersomnolence: no Wakes feeling refreshed: yes Good sleep hygiene: yes Apnea: no Snoring: no Depressed/anxious mood: yes Recent stress: yes Restless legs/nocturnal leg cramps: no Chronic pain/arthritis: yes at baseline History of sleep study: no Treatments attempted: melatonin      02/01/2024   11:30 AM 11/14/2023   11:19 AM 10/24/2023    3:08 PM 09/26/2023    3:59 PM 08/11/2023    3:17 PM  Depression screen PHQ 2/9  Decreased Interest 1 1 2  0 0  Down, Depressed, Hopeless 1 1 2 1  0  PHQ - 2 Score 2 2 4 1  0  Altered sleeping 3 1 2 2 1   Tired, decreased energy 1 1 2 2 1   Change in appetite 1 0 1 1 0  Feeling bad or failure about yourself  0 0 0 0 0  Trouble concentrating 1 1 2 1  0  Moving slowly or fidgety/restless 0 0 1 1 0  Suicidal thoughts 0 0 0 0 0  PHQ-9 Score 8 5 12 8 2   Difficult doing work/chores  Not difficult at all Somewhat difficult Somewhat difficult Not difficult at all       02/01/2024   11:31 AM 10/24/2023    3:08 PM 09/26/2023    4:00 PM 08/11/2023    3:18 PM  GAD 7 : Generalized Anxiety Score  Nervous, Anxious, on Edge 1 2 1  0  Control/stop worrying 1  1 1 1   Worry too much - different things 2 1 1 1   Trouble relaxing 1 1 1  0  Restless 1 1 1  0  Easily annoyed or irritable 1 1 1 1   Afraid - awful might happen 1 1 2 1   Total GAD 7 Score 8 8 8 4   Anxiety Difficulty Somewhat difficult Somewhat difficult Somewhat difficult Somewhat difficult   Relevant past medical, surgical, family and social history reviewed and updated as indicated. Interim medical history since our last visit reviewed. Allergies and medications reviewed and updated.  Review of Systems  Constitutional:  Negative for activity change, diaphoresis, fatigue and fever.  Respiratory:  Negative for cough, chest tightness, shortness of breath and wheezing.   Cardiovascular:  Negative for  chest pain, palpitations and leg swelling.  Gastrointestinal:  Positive for constipation. Negative for abdominal distention, abdominal pain, blood in stool, diarrhea, nausea and vomiting.  Endocrine: Negative for cold intolerance, heat intolerance, polydipsia, polyphagia and polyuria.  Neurological: Negative.   Psychiatric/Behavioral:  Positive for sleep disturbance. Negative for decreased concentration, self-injury and suicidal ideas. The patient is nervous/anxious.    Per HPI unless specifically indicated above     Objective:    BP 113/69   Wt 190 lb (86.2 kg)   BMI 26.50 kg/m   Wt Readings from Last 3 Encounters:  03/05/24 190 lb (86.2 kg)  02/14/24 189 lb 6 oz (85.9 kg)  02/01/24 193 lb (87.5 kg)    Physical Exam Vitals and nursing note reviewed.  Constitutional:      General: He is awake. He is not in acute distress.    Appearance: He is well-developed. He is not ill-appearing.  HENT:     Head: Normocephalic.     Right Ear: Hearing normal. No drainage.     Left Ear: Hearing normal. No drainage.  Eyes:     General: Lids are normal.        Right eye: No discharge.        Left eye: No discharge.     Conjunctiva/sclera: Conjunctivae normal.  Pulmonary:     Effort: Pulmonary effort is normal. No accessory muscle usage or respiratory distress.  Musculoskeletal:     Cervical back: Normal range of motion.  Neurological:     Mental Status: He is alert and oriented to person, place, and time.  Psychiatric:        Mood and Affect: Mood normal.        Behavior: Behavior normal. Behavior is cooperative.        Thought Content: Thought content normal.        Judgment: Judgment normal.    Results for orders placed or performed in visit on 01/02/24  T4, free   Collection Time: 01/02/24  4:38 PM  Result Value Ref Range   Free T4 2.08 (H) 0.82 - 1.77 ng/dL  TSH   Collection Time: 01/02/24  4:38 PM  Result Value Ref Range   TSH 2.510 0.450 - 4.500 uIU/mL      Assessment &  Plan:   Problem List Items Addressed This Visit       Digestive   Slow transit constipation   Chronic, is improving.  Having BM daily, but still occasional straining and discomfort.  Will continue Linzess , can take one to two capsules daily.  290 MCG would be max dose, he is aware of this.  He will alert provider via MyChart if improves bowels or if causes too much diarrhea -- may adjust dose  if improvement noted.  Goal is to not push to diarrhea stools daily, but to prevent straining.        Other   Insomnia   Ongoing. Tried Melatonin without benefit.  Using good sleep techniques. Would benefit Belsomra due to age, but not covered by insurance. Trazodone  interacts with Amiodarone , however no longer on this.  Would avoid Mirtazapine due to risk for interactions.  DayVigo interacts with Amiodarone  and Quviviq interacts with Effexor .  Continue Lunesta  as ordered which is offering a benefit.  Plan to use for shortest period possible.  Would benefit therapy time which may help sleep pattern or sleep study.      Depression - Primary   Chronic, stable.  Suspect some element of PTSD due to recent cardiac arrest and being brought back to life.  Could not take multiple medications for mood due to Amiodarone , which was recently stopped.  Continue Wellbutrin  as ordered (max dose), Buspar  20 MG TID (max dose) + Effexor  37.5 MG daily.  Denies SI/HI.  Monitor Wellbutrin  and Toprol  XL use -- discussed may need to consider mood medication adjustments in future.  Would benefit therapy in future.  Since is stable at present will maintain regimen.      Anxiety   Refer to depression plan of care.         Follow up plan: Return for as scheduled June 25th.

## 2024-03-05 NOTE — Assessment & Plan Note (Signed)
 Refer to depression plan of care.

## 2024-03-05 NOTE — Assessment & Plan Note (Signed)
 Ongoing. Tried Melatonin without benefit.  Using good sleep techniques. Would benefit Belsomra due to age, but not covered by insurance. Trazodone  interacts with Amiodarone , however no longer on this.  Would avoid Mirtazapine due to risk for interactions.  DayVigo interacts with Amiodarone  and Quviviq interacts with Effexor .  Continue Lunesta  as ordered which is offering a benefit.  Plan to use for shortest period possible.  Would benefit therapy time which may help sleep pattern or sleep study.

## 2024-03-05 NOTE — Assessment & Plan Note (Signed)
 Chronic, is improving.  Having BM daily, but still occasional straining and discomfort.  Will continue Linzess , can take one to two capsules daily.  290 MCG would be max dose, he is aware of this.  He will alert provider via MyChart if improves bowels or if causes too much diarrhea -- may adjust dose if improvement noted.  Goal is to not push to diarrhea stools daily, but to prevent straining.

## 2024-03-06 ENCOUNTER — Telehealth: Payer: Self-pay

## 2024-03-06 NOTE — Telephone Encounter (Signed)
 PA for Linzess  initiated and submitted via Cover My Meds. Key: [id number]

## 2024-03-07 ENCOUNTER — Encounter: Payer: Self-pay | Admitting: Nurse Practitioner

## 2024-03-07 NOTE — Telephone Encounter (Signed)
 PA approved. Patient notified of approval via mychart.

## 2024-03-13 ENCOUNTER — Encounter: Payer: Self-pay | Admitting: Nurse Practitioner

## 2024-03-13 MED ORDER — BELSOMRA 10 MG PO TABS: 10.0000 mg | ORAL_TABLET | Freq: Every evening | ORAL | 0 refills | Status: DC | PRN

## 2024-03-18 ENCOUNTER — Ambulatory Visit: Admitting: Gastroenterology

## 2024-03-18 ENCOUNTER — Ambulatory Visit: Payer: Medicare HMO | Admitting: Gastroenterology

## 2024-03-18 ENCOUNTER — Encounter: Payer: Self-pay | Admitting: Gastroenterology

## 2024-03-18 ENCOUNTER — Telehealth: Payer: Self-pay

## 2024-03-18 NOTE — Telephone Encounter (Signed)
 Attempted to contact the patient twice to reschedule his appointment; there was no answer. A voicemail was left requesting a call back to reschedule.  The patient later returned the call and expressed that he was very upset, stating he had waited three months for this appointment. I explained that Dr. Antony Baumgartner had to leave town due to a family matter, and that our current providers are transitioning out. The new provider will not have an available schedule until August.  Although the patient expressed appreciation for the MyChart visit, he stated that he is very sick and needs further help. He requested to speak directly with Ginger.

## 2024-03-19 ENCOUNTER — Encounter: Payer: Self-pay | Admitting: Gastroenterology

## 2024-03-19 ENCOUNTER — Ambulatory Visit: Admitting: Gastroenterology

## 2024-03-19 ENCOUNTER — Encounter: Payer: Self-pay | Admitting: Nurse Practitioner

## 2024-03-19 ENCOUNTER — Other Ambulatory Visit
Admission: RE | Admit: 2024-03-19 | Discharge: 2024-03-19 | Disposition: A | Discharge status: Home / Self Care | Source: Ambulatory Visit | Attending: Gastroenterology | Admitting: Gastroenterology

## 2024-03-19 VITALS — BP 126/73 | HR 82 | Temp 97.4°F | Ht 71.0 in | Wt 186.5 lb

## 2024-03-19 DIAGNOSIS — E039 Hypothyroidism, unspecified: Secondary | ICD-10-CM | POA: Insufficient documentation

## 2024-03-19 DIAGNOSIS — K5904 Chronic idiopathic constipation: Secondary | ICD-10-CM

## 2024-03-19 DIAGNOSIS — K5909 Other constipation: Secondary | ICD-10-CM | POA: Diagnosis not present

## 2024-03-19 NOTE — Progress Notes (Unsigned)
 Karma Oz, MD [address]  [address]  [place], [place] [address]  Main: [phone]  Fax: [phone]    Gastroenterology Consultation  Referring Provider:     Lemar Pyles, NP Primary Care Physician:  Lemar Pyles, NP Primary Gastroenterologist:  Dr. Karma Oz Reason for Consultation: Chronic constipation        HPI:   Stuart Harvey is a 73 y.o. male referred by Lemar Pyles, NP  for consultation & management of chronic constipation.  Patient had a cardiac arrest last year and since then he has been suffering from severe constipation.  He was on amiodarone  that he thinks has worsened his constipation.  He is admitted on has been discontinued.  He is started on Linzess  145 mcg daily by his PCP and he reports having 1-2 diarrheal episodes daily and feels completely emptied.  He is no longer suffering from constipation.  Patient is accompanied by his wife today.  Patient has not been very active since his cardiac arrest last year.  He eats less amount of fruits and vegetables, mostly consumes animal protein includes red meat regularly.  Does not drink much water, does drink sweet tea, carbonated beverages.  His free T4 has been uptrending.  Patient also reports insomnia, panic episodes as well as anxiety since he had cardiac arrest.  He is on Synthroid  150 mcg daily.  NSAIDs: None none  Antiplts/Anticoagulants/Anti thrombotics: None  GI Procedures:  Colonoscopy 11/17/2021 - Two 4 to 5 mm polyps in the rectum, removed with a cold snare. Resected and retrieved. - Diverticulosis in the recto- sigmoid colon and in the sigmoid colon. - The distal rectum and anal verge are normal on retroflexion view. - The examination was otherwise normal. DIAGNOSIS:  A. RECTUM POLYP X2; COLD SNARE:  - HYPERPLASTIC POLYP, TWO FRAGMENTS.  - NEGATIVE FOR DYSPLASIA AND MALIGNANCY.    Past Medical History:  Diagnosis Date   AAA (abdominal aortic aneurysm) (HCC)    a.) s/p  EVAR in 08/2007 by Dr. Jerre Moots. b.) CTA on 06/30/2021 --> interval aneurysmal dilitation superior to stent graft with the juxtarenal aorta measuring 3.8 cm.   Anemia    Arthritis    both knees   Chronic anticoagulation    Apixaban    Chronic HFrEF (heart failure with reduced ejection fraction) (HCC)    a. 07/2022 Echo: EF 35-40%, glob HK, mild LVH, GrI DD, nl RV fxn, mildly dil RA/LA, mild MR, mild AoV sclerosis. Asc Ao 40mm.   CKD (chronic kidney disease), stage II    Coronary artery disease    a. 06/2007 CABGx4 @ [facility] (LIMA-LAD, VG-RPDA, VG-OM3,VG-D2); b. 2011 Cath: patent grafts EF 40%, c.06/05/18 Cath: Sev native dzs, LIMA-LAD patent, VG-RPDA patent, VG-OM3 mid-graft 60% and 80%; d. 07/2022 Cath: LM nl, LAD 50p, 90p/m, D1 70, RI  30, LCX 40ost/p, 100p CTO, OM3 100, RCA 100p CTO, LIMA->LAD ok, VG->D2 ok, VG->RPDA min irregs, VG->OM3 100->Med rx.   Depression    GERD (gastroesophageal reflux disease)    Hernia of abdominal cavity    History of 2019 novel coronavirus disease (COVID-19) 09/2019   Hyperlipidemia    Hypertension    Hypothyroidism    Iliac artery aneurysm, right (HCC)    a.) at the distal landing zone of the right iliac limb measuring 3 cm   Ischemic cardiomyopathy    Right middle lobe pulmonary nodule 06/30/2021   a.) measured 8 mm by CT on 06/30/2021.   S/P CABG  x 4 06/25/2007   a.) LIMA-LAD, SVG-RPDA, SVG-OM3, SVG-D2   Sigmoid diverticulosis    Ventricular tachycardia (HCC) 05/2018   a.) found unresponsive by EMS; VT noted --> defibrillation achieved ROSC. Briefly intubated. Episode felt to be secondary to heat stoke.    Past Surgical History:  Procedure Laterality Date   ABDOMINAL AORTIC ANEURYSM REPAIR N/A 08/29/2007   Procedure: EVAR; Location: [facility]; Surgeon: Raynaldo Call, MD   ABDOMINAL AORTIC ANEURYSM REPAIR N/A 12/05/2012   Procedure: Abdominal aortic aneurysm of approximately 6 cm in maximal diameter status post previous endovascular repair with type I and III  endoleaks; Location: [facility]; Surgeon: Mikki Alexander, MD   CARDIAC CATHETERIZATION Left 06/28/2010   3v CAD with patent CABG grafts; LVEF 40%; stable aortic stent graft; Location: [facility]; Surgeon: Thomasene Flemings, MD   CARDIAC CATHETERIZATION Left 06/18/2007   3v CAD; LVEF 50%; refer to CVTS for CABG; Location: [facility]; Surgeon: Antionette Kirks, MD   CARDIOVERSION N/A 05/22/2023   Procedure: CARDIOVERSION;  Surgeon: Wenona Hamilton, MD;  Location: [facility] ORS;  Service: Cardiovascular;  Laterality: N/A;   COLONOSCOPY WITH PROPOFOL  N/A 11/17/2021   Procedure: COLONOSCOPY WITH PROPOFOL ;  Surgeon: Selena Daily, MD;  Location: [facility] ENDOSCOPY;  Service: Gastroenterology;  Laterality: N/A;   CORONARY ARTERY BYPASS GRAFT N/A 06/25/2007   Procedure: 4v CABG (LIMA-LAD, SVG-RPDA, SVG-OM3, SVG-D2); Location: [facility]; Surgeon: Dorethea Ganong, MD   CORONARY/GRAFT ANGIOGRAPHY N/A 06/05/2018   Procedure: Delphia Ficks ANGIOGRAPHY;  Surgeon: Sammy Crisp, MD;  Location: [facility] INVASIVE CV LAB;  Service: Cardiovascular;  Laterality: N/A;   ELECTROPHYSIOLOGY STUDY N/A 05/26/2023   Procedure: ELECTROPHYSIOLOGY STUDY;  Surgeon: Boyce Byes, MD;  Location: [facility] INVASIVE CV LAB;  Service: Cardiovascular;  Laterality: N/A;   ENDOVASCULAR REPAIR/STENT GRAFT N/A 08/04/2021   Procedure: ENDOVASCULAR REPAIR/STENT GRAFT;  Surgeon: Celso College, MD;  Location: [facility] INVASIVE CV LAB;  Service: Cardiovascular;  Laterality: N/A;   EXPLORATORY LAPAROTOMY  1997   HERNIA REPAIR     ICD IMPLANT N/A 07/03/2023   Procedure: ICD IMPLANT;  Surgeon: Boyce Byes, MD;  Location: [facility] INVASIVE CV LAB;  Service: Cardiovascular;  Laterality: N/A;   KNEE ARTHROPLASTY Left 09/19/2016   Procedure: COMPUTER ASSISTED TOTAL KNEE ARTHROPLASTY;  Surgeon: Arlyne Lame, MD;  Location: [facility] ORS;  Service: Orthopedics;  Laterality: Left;   RIGHT/LEFT HEART CATH AND CORONARY ANGIOGRAPHY N/A 08/05/2022   Procedure: RIGHT/LEFT HEART CATH AND CORONARY  ANGIOGRAPHY;  Surgeon: Wenona Hamilton, MD;  Location: [facility] INVASIVE CV LAB;  Service: Cardiovascular;  Laterality: N/A;   TOTAL KNEE ARTHROPLASTY Right 09/2016     Current Outpatient Medications:    acetaminophen  (TYLENOL ) 500 MG tablet, Take 1,000 mg by mouth every 6 (six) hours as needed for moderate pain., Disp: , Rfl:    apixaban  (ELIQUIS ) 5 MG TABS tablet, Take 1 tablet (5 mg total) by mouth 2 (two) times daily., Disp: 180 tablet, Rfl: 1   buPROPion  (WELLBUTRIN  SR) 200 MG 12 hr tablet, Take 1 tablet (200 mg total) by mouth 2 (two) times daily., Disp: 180 tablet, Rfl: 1   busPIRone  (BUSPAR ) 10 MG tablet, Take 2 tablets (20 mg total) by mouth 3 (three) times daily., Disp: 540 tablet, Rfl: 1   Cholecalciferol  50 MCG (2000 UT) CAPS, Take 1 capsule by mouth daily. , Disp: , Rfl:    eszopiclone  (LUNESTA ) 1 MG TABS tablet, Take 1 tablet (1 mg total) by mouth at bedtime as needed for sleep. Take immediately before bedtime, Disp: 30 tablet, Rfl: 2  ezetimibe  (ZETIA ) 10 MG tablet, Take 1 tablet (10 mg total) by mouth daily., Disp: 90 tablet, Rfl: 4   levothyroxine  (SYNTHROID ) 150 MCG tablet, Take 1 tablet (150 mcg total) by mouth daily., Disp: 90 tablet, Rfl: 3   linaclotide  (LINZESS ) 145 MCG CAPS capsule, Take 1-2 capsules (145-290 mcg total) by mouth daily before breakfast. Only take extra capsule as needed (290 MCG dosing)., Disp: 60 capsule, Rfl: 12   metoprolol  succinate (TOPROL -XL) 25 MG 24 hr tablet, Take 0.5 tablets (12.5 mg total) by mouth daily., Disp: 45 tablet, Rfl: 1   nitroGLYCERIN  (NITROSTAT ) 0.4 MG SL tablet, Place 1 tablet (0.4 mg total) under the tongue every 5 (five) minutes x 3 doses as needed for chest pain., Disp: 25 tablet, Rfl: 2   omeprazole  (PRILOSEC) 20 MG capsule, Take 1 capsule (20 mg total) by mouth daily., Disp: 90 capsule, Rfl: 4   OVER THE COUNTER MEDICATION, Take 1-2 tablets by mouth at bedtime as needed (sleep). Relaxium sleep aid, Disp: , Rfl:    rosuvastatin   (CRESTOR ) 40 MG tablet, Take 1 tablet (40 mg total) by mouth daily., Disp: 90 tablet, Rfl: 4   sacubitril -valsartan  (ENTRESTO ) 24-26 MG, Take 1 tablet by mouth 2 (two) times daily., Disp: 60 tablet, Rfl: 6   venlafaxine  XR (EFFEXOR  XR) 37.5 MG 24 hr capsule, Take 1 capsule (37.5 mg total) by mouth daily with breakfast., Disp: 90 capsule, Rfl: 2   Suvorexant  (BELSOMRA ) 10 MG TABS, Take 1 tablet (10 mg total) by mouth at bedtime as needed. (Patient not taking: Reported on 03/19/2024), Disp: 30 tablet, Rfl: 0   Family History  Problem Relation Age of Onset   Heart disease Mother    Heart attack Mother    Hypertension Mother    Heart attack Father    Hypertension Father    Kidney disease Brother    Depression Maternal Grandmother    Depression Maternal Grandfather    Heart disease Other    Heart attack Other    Prostate cancer Neg Hx    Kidney cancer Neg Hx    Stomach cancer Neg Hx    Colon cancer Neg Hx      Social History   Tobacco Use   Smoking status: Former    Current packs/day: 0.00    Average packs/day: 1 pack/day for 35.0 years (35.0 ttl pk-yrs)    Types: Cigarettes    Start date: 12/16/1970    Quit date: 12/16/2005    Years since quitting: 18.2    Passive exposure: Past   Smokeless tobacco: Former    Types: Chew   Tobacco comments:    pt states he very rarely uses smokeless tobacco   Vaping Use   Vaping status: Never Used  Substance Use Topics   Alcohol use: No   Drug use: No    Allergies as of 03/19/2024 - Review Complete 03/19/2024  Allergen Reaction Noted   Benzodiazepines  06/06/2018   Codeine Nausea Only 02/03/2015   Tetracycline Rash 02/03/2015    Review of Systems:    All systems reviewed and negative except where noted in HPI.   Physical Exam:  BP 126/73 (BP Location: Right Arm, Patient Position: Sitting, Cuff Size: Normal)   Pulse 82   Temp (!) 97.4 F (36.3 C) (Oral)   Ht 5\' 11"  (1.803 m)   Wt 186 lb 8 oz (84.6 kg)   BMI 26.01 kg/m  No LMP for  male patient.  General:   Alert,  Well-developed, well-nourished, pleasant  and cooperative in NAD Head:  Normocephalic and atraumatic. Eyes:  Sclera clear, no icterus.   Conjunctiva pink. Ears:  Normal auditory acuity. Nose:  No deformity, discharge, or lesions. Mouth:  No deformity or lesions,oropharynx pink & moist. Neck:  Supple; no masses or thyromegaly. Lungs:  Respirations even and unlabored.  Clear throughout to auscultation.   No wheezes, crackles, or rhonchi. No acute distress. Heart:  Regular rate and rhythm; no murmurs, clicks, rubs, or gallops. Abdomen:  Normal bowel sounds. Soft, non-tender and non-distended without masses, hepatosplenomegaly or hernias noted.  No guarding or rebound tenderness.   Rectal: Not performed Msk:  Symmetrical without gross deformities. Good, equal movement & strength bilaterally. Pulses:  Normal pulses noted. Extremities:  No clubbing or edema.  No cyanosis. Neurologic:  Alert and oriented x3;  grossly normal neurologically. Skin:  Intact without significant lesions or rashes. No jaundice. Psych:  Alert and cooperative. Normal mood and affect.  Imaging Studies: Reviewed  Assessment and Plan:   Stuart Harvey is a 73 y.o. male with history of AAA repair, coronary artery disease status post CABG, ischemic cardiomyopathy, V. tach status post AICD, paroxysmal A-fib on Eliquis , previously on amiodarone  is seen in consultation for chronic constipation which has significantly improved on Linzess  145 mcg daily  Chronic constipation Discussed with patient regarding gradually increasing physical activity, more walking and strength training Incorporate drinking more water and cut back on animal protein, eat more fiber, advised patient to completely limit consumption of sweet tea and carbonated beverages Continue Linzess  145 mcg daily Check thyroid  profile, he is on Synthroid  150 mcg daily and his dose may need to be readjusted as he has been losing weight  along with symptoms of insomnia, panic attacks and anxiety  Follow up in 3 to 4 months   Karma Oz, MD

## 2024-03-20 LAB — THYROID PANEL
Free Thyroxine Index: 4.3 (ref 1.2–4.9)
T3 Uptake Ratio: 37 % (ref 24–39)
T4, Total: 11.5 ug/dL (ref 4.5–12.0)

## 2024-03-21 ENCOUNTER — Telehealth: Payer: Self-pay

## 2024-03-21 NOTE — Telephone Encounter (Signed)
-----   Message from Ellis Guys sent at 03/20/2024  4:39 PM EDT ----- Stuart Harvey  Please inform patient that his thyroid  profile is reassuring and I have discussed with his PCP.  No change in his medication at this time and she will see him in June  RV

## 2024-03-21 NOTE — Telephone Encounter (Signed)
 Patient verbalized understanding of results

## 2024-03-22 ENCOUNTER — Encounter: Payer: Self-pay | Admitting: Nurse Practitioner

## 2024-03-25 ENCOUNTER — Ambulatory Visit: Admitting: Gastroenterology

## 2024-04-01 ENCOUNTER — Ambulatory Visit: Admitting: Gastroenterology

## 2024-04-02 ENCOUNTER — Ambulatory Visit (INDEPENDENT_AMBULATORY_CARE_PROVIDER_SITE_OTHER): Payer: Medicare HMO

## 2024-04-02 ENCOUNTER — Encounter (INDEPENDENT_AMBULATORY_CARE_PROVIDER_SITE_OTHER): Payer: Self-pay

## 2024-04-02 DIAGNOSIS — I255 Ischemic cardiomyopathy: Secondary | ICD-10-CM | POA: Diagnosis not present

## 2024-04-03 ENCOUNTER — Telehealth: Payer: Self-pay | Admitting: Cardiology

## 2024-04-03 LAB — CUP PACEART REMOTE DEVICE CHECK
Battery Remaining Longevity: 145 mo
Battery Voltage: 3.03 V
Brady Statistic RV Percent Paced: 0.5 %
Date Time Interrogation Session: 20250519231531
HighPow Impedance: 68 Ohm
Implantable Lead Connection Status: 753985
Implantable Lead Connection Status: 753985
Implantable Lead Implant Date: 20240819
Implantable Lead Implant Date: 20240819
Implantable Lead Location: 753859
Implantable Lead Location: 753860
Implantable Lead Model: 5076
Implantable Pulse Generator Implant Date: 20240819
Lead Channel Impedance Value: 380 Ohm
Lead Channel Impedance Value: 437 Ohm
Lead Channel Impedance Value: 456 Ohm
Lead Channel Pacing Threshold Amplitude: 0.625 V
Lead Channel Pacing Threshold Amplitude: 0.625 V
Lead Channel Pacing Threshold Pulse Width: 0.4 ms
Lead Channel Pacing Threshold Pulse Width: 0.4 ms
Lead Channel Sensing Intrinsic Amplitude: 1.5 mV
Lead Channel Sensing Intrinsic Amplitude: 13.9 mV
Lead Channel Setting Pacing Amplitude: 1.5 V
Lead Channel Setting Pacing Amplitude: 2 V
Lead Channel Setting Pacing Pulse Width: 0.4 ms
Lead Channel Setting Sensing Sensitivity: 0.3 mV
Zone Setting Status: 755011
Zone Setting Status: 755011

## 2024-04-03 NOTE — Progress Notes (Addendum)
 Electrophysiology Clinic Note    Date:  04/04/2024  Patient ID:  Stuart Harvey, Stuart Harvey 08/16/1951, MRN [id number] PCP:  Stuart Pyles, NP  Cardiologist:  Stuart Kirks, MD Electrophysiologist: Stuart Byes, MD   Discussed the use of AI scribe software for clinical note transcription with the patient, who gave verbal consent to proceed.   Patient Profile    Chief Complaint: ICD shock  History of Present Illness: Stuart Harvey is a 73 y.o. male with PMH notable for CAD s/p CABG (2008), HFrEF, ICM, recurrent syncope with inducible VT s/p ICD, parox AFib, HTN; seen today for Stuart Byes, MD for acute visit due to ICD shock.    He had unexplained syncope 05/2023 where wife performed CPR. He was in afib throughout the hospitalization so unclear whether vagally-mediated iso of aFib or arrhythmogenic cause.  Given his CAD history, EPS was recommended with inducible VT. He had ICD implanted 06/2023.   I last saw him 02/2024 at which time he was very concerned that amiodarone  was causing constipation and abdominal pain, and amio was stopped.   Device clinic was alerted earlier this week for multiple episodes. One episode of ATP, second episode with multiple rounds of ATP and HV shock. 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 He was completely asymptomatic, no chest pain, chest pressure, palpitations, dizziness, or LH. No LOC after HV therapy. He takes medications regularly without missing doses.   He says that since stopping amiodarone  his GI system has returned to normal, is able to eat regularly and has gained some weight back. He feels better than he has in many, many months.   Continues to take 5mg  eliquis  BID, no bleeding concerns.    He currently denies chest pain, chest pressure, palpitations.   His wife joins for appt whom I have met previously.     Arrhythmia/Device History MDT dual chamber ICD, imp 06/2023; dx VT  Amiodarone  -  Started 7/24  after VT stopped 02/2024 d/t constipation, GI upset      ROS:  Please see the history of present illness. All other systems are reviewed and otherwise negative.    Physical Exam    VS:  BP 110/62 (BP Location: Left Arm, Patient Position: Sitting)   Pulse 81   Ht 5\' 11"  (1.803 m)   Wt 187 lb 6.4 oz (85 kg)   SpO2 97%   BMI 26.14 kg/m  BMI: Body mass index is 26.14 kg/m.  Wt Readings from Last 3 Encounters:  04/04/24 187 lb 6.4 oz (85 kg)  03/19/24 186 lb 8 oz (84.6 kg)  03/05/24 190 lb (86.2 kg)     GEN- The patient is well appearing, alert and oriented x 3 today.   Lungs- Clear to ausculation bilaterally, normal work of breathing.  Heart- Regular rate and rhythm, no murmurs, rubs or gallops Extremities- No peripheral edema, warm, dry Skin-  device pocket well-healed, no tethering   Device interrogation done today and reviewed by myself:  Battery 12  years Lead thresholds, impedence, sensing stable   Episode 16:  CL 280-300 ms so falls into VT1 zone 7 rounds of ATP followed by 1 HV therapy EGM appears atrially-driven with 1:1 conduction. Do not see beginning of episode.   No changes made today  Discussed with MDT rep, Stuart Harvey.   Studies Reviewed   Previous EP, cardiology notes.    EKG is ordered. Personal review of EKG from today shows:  EKG Interpretation  Date/Time:  Thursday Apr 04 2024 10:53:36 EDT Ventricular Rate:  81 PR Interval:  102 QRS Duration:  122 QT Interval:  400 QTC Calculation: 464 R Axis:   -58  Text Interpretation: Sinus rhythm with short PR Left anterior fascicular block Minimal voltage criteria for LVH, may be normal variant ( Cornell product ) Confirmed by Stuart Harvey [phone]) on 04/04/2024 1:44:49 PM     Cardiac MRI, 05/23/2023 1.  Moderately reduced LV systolic function.  LVEF = 38%  2.  Lateral wall LV Basal-mid subendocardial scar/LGE.  3.  LGE/scar accounts for 12.77g (11%) of total myocardial mass.  4.  Normal RV function.   5.  Findings consistent with prior LV infarct.  6.  Ischemic cardiomyopathy.  TTE, 05/19/2023 1. Left ventricular ejection fraction, by estimation, is 40 to 45%. The left ventricle has mildly decreased function. The left ventricle demonstrates regional wall motion abnormalities (inferolateral wall hypokinesis). There is moderate asymmetric left ventricular hypertrophy of the septal segment. Left ventricular diastolic parameters are consistent with Grade I diastolic dysfunction (impaired relaxation).   2. Right ventricular systolic function is normal. The right ventricular size is normal. There is normal pulmonary artery systolic pressure. The estimated right ventricular systolic pressure is 29.0 mmHg.   3. The mitral valve is normal in structure. No evidence of mitral valve regurgitation. No evidence of mitral stenosis.   4. Tricuspid valve regurgitation is moderate.   5. The aortic valve is normal in structure. Aortic valve regurgitation is not visualized. No aortic stenosis is present.   6. There is borderline dilatation of the aortic root, measuring 40 mm.   7. The inferior vena cava is normal in size with greater than 50% respiratory variability, suggesting right atrial pressure of 3 mmHg.     Assessment and Plan     #) parox afib #) inducible VT s/p ICD  #) inappropriate shock d/t aflutter vs atach #) HFrEF #) hypothyroid S/p EPS with inducible VT and now s/p dual chamber ICD Recent HV therapy for what appears to be Aflutter vs Atach with CL falling into the VT zone at ~200bpm Recently stopped amiodarone  for GI side effects, he does not want to restart amio Will increase metop to 25mg  BID Update CBC, CMP, thyroid  labs, BNP  No driving at this time.  Can consider adjusting VT/VF zones, will discuss further with Dr. Marven Harvey Close follow-up scheduled next week to re-eval   #) Hypercoag d/t parox afib CHA2DS2-VASc Score = at least 4 [CHF History: 1, HTN History: 1, Diabetes  History: 0, Stroke History: 0, Vascular Disease History: 1, Age Score: 1, Gender Score: 0].  Therefore, the patient's annual risk of stroke is 4.8 %.    Stroke ppx - 5mg  eliquis  BID, appropriately dosed No bleeding concerns     Current medicines are reviewed at length with the patient today.   The patient has concerns regarding his medicines.  The following changes were made today:   INCREASE metop to 25mg  in AM and PM    Labs/ tests ordered today include:  Orders Placed This Encounter  Procedures   Comprehensive metabolic panel with GFR   Magnesium    CBC   B Nat Peptide   T4, free   TSH   EKG 12-Lead     Disposition: Follow up with EP APP in 1 week   I spent 45 minutes in face-to-face care with this patient today interrogating device, discussing with MDT rep, and an additional 10 minutes discussing with Dr. Marven Harvey.  Signed, Mahesh Sizemore, NP  04/04/24  1:47 PM  Electrophysiology [facility]

## 2024-04-03 NOTE — Telephone Encounter (Signed)
  1. Has your device fired?   Yes - about 6:30 pm last night  2. Is you device beeping?   No  3. Are you experiencing draining or swelling at device site?   No  4. Are you calling to see if we received your device transmission?   5. Have you passed out?   No   Wife ([name]) called to report patient's device fired yesterday around 6:30 pm and wants a call back to discuss next steps.   Please route to Device Clinic Pool

## 2024-04-03 NOTE — Telephone Encounter (Signed)
 Need to get patient in clinic tomorrow to discuss AF management.  Dr. Marven Slimmer agrees inappropriate shock for AF,FL/ RVR.

## 2024-04-03 NOTE — Telephone Encounter (Signed)
 Patient scheduled to see Suzann Riddle, NP tomorrow in [place] at 11am to review and discuss AF management. Patient aware.

## 2024-04-03 NOTE — Telephone Encounter (Signed)
 Alert remote transmission:  ATP AND Shock alert.  Events occurred:  5/20 @ 12:54, duration 14sec per device, HR 207.  EGM c/w likely retrograde conduction vs atrial driven arrhythmia, 1 burst of ATP slowing to AF.AFL   5/20 @18 :35, duration 27sec, HR 207.  EGM c/w likely retrograde conduction vs  atrial driven arrhythmia, ATP x7 followed by 40J HV therapy converting to regular AS/VS  Hx of PAF, Eliquis  per EPIC   Patient is doing well. States he was filling a sprayer bottle while doing yard work yesterday pm.  He never became symptomatic and is doing well.    In review of rhythm suspect this may be inappropriate therapy due to AF/flutter with RVR.  Will review with Dr. Marven Slimmer and call patient back with next steps.

## 2024-04-04 ENCOUNTER — Ambulatory Visit: Payer: Self-pay | Admitting: Cardiology

## 2024-04-04 ENCOUNTER — Ambulatory Visit: Attending: Cardiology | Admitting: Cardiology

## 2024-04-04 VITALS — BP 110/62 | HR 81 | Ht 71.0 in | Wt 187.4 lb

## 2024-04-04 DIAGNOSIS — I480 Paroxysmal atrial fibrillation: Secondary | ICD-10-CM

## 2024-04-04 DIAGNOSIS — D6869 Other thrombophilia: Secondary | ICD-10-CM | POA: Insufficient documentation

## 2024-04-04 DIAGNOSIS — Z95810 Presence of automatic (implantable) cardiac defibrillator: Secondary | ICD-10-CM

## 2024-04-04 DIAGNOSIS — I5020 Unspecified systolic (congestive) heart failure: Secondary | ICD-10-CM | POA: Diagnosis not present

## 2024-04-04 DIAGNOSIS — I472 Ventricular tachycardia, unspecified: Secondary | ICD-10-CM

## 2024-04-04 DIAGNOSIS — E039 Hypothyroidism, unspecified: Secondary | ICD-10-CM

## 2024-04-04 MED ORDER — METOPROLOL SUCCINATE ER 25 MG PO TB24: 25.0000 mg | ORAL_TABLET | Freq: Every day | ORAL | 1 refills | Status: DC

## 2024-04-04 NOTE — Patient Instructions (Signed)
 Medication Instructions:  Increase Metoprolol  25 mg daily   *If you need a refill on your cardiac medications before your next appointment, please call your pharmacy*  Labs: Your provider would like for you to have following labs drawn today BNP, CBC, CMET, MAG, TSH, T4.    Follow-Up: At [facility], you and your health needs are our priority.  As part of our continuing mission to provide you with exceptional heart care, our providers are all part of one team.  This team includes your primary Cardiologist (physician) and Advanced Practice Providers or APPs (Physician Assistants and Nurse Practitioners) who all work together to provide you with the care you need, when you need it.  Your next appointment:   Keep scheduled appointment next week with Suzann Riddle, NP     We recommend signing up for the patient portal called "MyChart".  Sign up information is provided on this After Visit Summary.  MyChart is used to connect with patients for Virtual Visits (Telemedicine).  Patients are able to view lab/test results, encounter notes, upcoming appointments, etc.  Non-urgent messages can be sent to your provider as well.   To learn more about what you can do with MyChart, go to [email or web address].

## 2024-04-05 ENCOUNTER — Ambulatory Visit: Payer: Self-pay | Admitting: Cardiology

## 2024-04-05 LAB — CBC
Hematocrit: 41.1 % (ref 37.5–51.0)
Hemoglobin: 12.8 g/dL — ABNORMAL LOW (ref 13.0–17.7)
MCH: 29.2 pg (ref 26.6–33.0)
MCHC: 31.1 g/dL — ABNORMAL LOW (ref 31.5–35.7)
MCV: 94 fL (ref 79–97)
Platelets: 197 10*3/uL (ref 150–450)
RBC: 4.38 x10E6/uL (ref 4.14–5.80)
RDW: 13.2 % (ref 11.6–15.4)
WBC: 8.9 10*3/uL (ref 3.4–10.8)

## 2024-04-05 LAB — COMPREHENSIVE METABOLIC PANEL WITH GFR
ALT: 14 IU/L (ref 0–44)
AST: 28 IU/L (ref 0–40)
Albumin: 4.1 g/dL (ref 3.8–4.8)
Alkaline Phosphatase: 105 IU/L (ref 44–121)
BUN/Creatinine Ratio: 17 (ref 10–24)
BUN: 22 mg/dL (ref 8–27)
Bilirubin Total: 0.4 mg/dL (ref 0.0–1.2)
CO2: 19 mmol/L — ABNORMAL LOW (ref 20–29)
Calcium: 9.3 mg/dL (ref 8.6–10.2)
Chloride: 105 mmol/L (ref 96–106)
Creatinine, Ser: 1.27 mg/dL (ref 0.76–1.27)
Globulin, Total: 2.3 g/dL (ref 1.5–4.5)
Glucose: 102 mg/dL — ABNORMAL HIGH (ref 70–99)
Potassium: 5.1 mmol/L (ref 3.5–5.2)
Sodium: 139 mmol/L (ref 134–144)
Total Protein: 6.4 g/dL (ref 6.0–8.5)
eGFR: 60 mL/min/{1.73_m2} (ref >59)

## 2024-04-05 LAB — TSH: TSH: 0.949 u[IU]/mL (ref 0.450–4.500)

## 2024-04-05 LAB — T4, FREE: Free T4: 1.73 ng/dL (ref 0.82–1.77)

## 2024-04-05 LAB — BRAIN NATRIURETIC PEPTIDE: BNP: 321.3 pg/mL — ABNORMAL HIGH (ref 0.0–100.0)

## 2024-04-05 LAB — MAGNESIUM: Magnesium: 2.1 mg/dL (ref 1.6–2.3)

## 2024-04-05 MED ORDER — METOPROLOL SUCCINATE ER 25 MG PO TB24: 25.0000 mg | ORAL_TABLET | Freq: Two times a day (BID) | ORAL | 3 refills | Status: AC

## 2024-04-05 NOTE — Telephone Encounter (Signed)
 Per OV note on 04/04/24 by Lyle San: #) parox afib #) VT #) ICD in situ #) HFrEF #) hypothyroid S/p EPS with inducible VT and now s/p dual chamber ICD Recent HV therapy for what appears to be SVT falling into the VT zone at ~200bpm Recently stopped amiodarone  for GI side effects, he does not want to restart amio Will increase metop to 25mg  BID  New prescription sent to requested pharmacy to reflect twice daily dosing and pt made aware.

## 2024-04-09 NOTE — Progress Notes (Unsigned)
 Electrophysiology Clinic Note    Date:  04/10/2024  Patient ID:  Stuart Harvey, Stuart Harvey 1950/12/15, MRN [id number] PCP:  Lemar Pyles, NP  Cardiologist:  Antionette Kirks, MD Electrophysiologist: Boyce Byes, MD   Discussed the use of AI scribe software for clinical note transcription with the patient, who gave verbal consent to proceed.   Patient Profile    Chief Complaint: ICD shock follow-up  History of Present Illness: Stuart Harvey is a 73 y.o. male with PMH notable for CAD s/p CABG (2008), HFrEF, ICM, recurrent syncope with inducible VT s/p ICD, parox AFib, HTN; seen today for Boyce Byes, MD for acute visit due to ICD shock.    He had unexplained syncope 05/2023 where wife performed CPR. He was in afib throughout the hospitalization so unclear whether vagally-mediated iso of aFib or arrhythmogenic cause.  Given his CAD history, EPS was recommended with inducible VT. He had ICD implanted 06/2023.   I last saw him 02/2024 at which time he was very concerned that amiodarone  was causing constipation and abdominal pain, and amio was stopped.  I saw him last week after device clinic was alerted multiple episodes - One episode of ATP, second episode with multiple rounds of ATP and HV shock.  He was asymptomatic during episode, no LOC, no missed medications.  He felt much better from a GI perspective since stopping amiodarone .  Interrogation of episodes revealed atrially driven tachycardia, ?Atach vs aflutter.  Reviewed tracings with Dr. Marven Slimmer.   Patient presents today to adjust his device settings after extensive discussion with Dr. Marven Slimmer. He feels well without complaints today. He is tolerating 25mg  toprol  BID well without off-target effects.  Continues to take 5mg  eliquis  BID, no bleeding concerns.    His wife joins for appt whom I have met previously.     Arrhythmia/Device History MDT dual chamber ICD, imp 06/2023; dx VT  Amiodarone  -  Started 7/24 after  VT stopped 02/2024 d/t constipation, GI upset      ROS:  Please see the history of present illness. All other systems are reviewed and otherwise negative.    Physical Exam    VS:  BP 106/72 (BP Location: Left Arm, Patient Position: Sitting, Cuff Size: Normal)   Pulse 74   Ht 5\' 11"  (1.803 m)   Wt 186 lb 6.4 oz (84.6 kg)   SpO2 98%   BMI 26.00 kg/m  BMI: Body mass index is 26 kg/m.  Wt Readings from Last 3 Encounters:  04/10/24 186 lb 6.4 oz (84.6 kg)  04/04/24 187 lb 6.4 oz (85 kg)  03/19/24 186 lb 8 oz (84.6 kg)     GEN- The patient is well appearing, alert and oriented x 3 today.   Lungs- Clear to ausculation bilaterally, normal work of breathing.  Heart- Regular rate and rhythm, no murmurs, rubs or gallops Extremities- No peripheral edema, warm, dry Skin-  device pocket well-healed, no tethering   Device interrogation done today and reviewed by myself:  Battery 12  years Leads not tested  Changes this session -    MDT Rep, Johnnette Nakayama, assisted with device adjustments   Studies Reviewed   Previous EP, cardiology notes.    EKG is not ordered.        Cardiac MRI, 05/23/2023 1.  Moderately reduced LV systolic function.  LVEF = 38%  2.  Lateral wall LV Basal-mid subendocardial scar/LGE.  3.  LGE/scar accounts for 12.77g (11%) of total myocardial mass.  4.  Normal RV function.  5.  Findings consistent with prior LV infarct.  6.  Ischemic cardiomyopathy.  TTE, 05/19/2023 1. Left ventricular ejection fraction, by estimation, is 40 to 45%. The left ventricle has mildly decreased function. The left ventricle demonstrates regional wall motion abnormalities (inferolateral wall hypokinesis). There is moderate asymmetric left ventricular hypertrophy of the septal segment. Left ventricular diastolic parameters are consistent with Grade I diastolic dysfunction (impaired relaxation).   2. Right ventricular systolic function is normal. The right ventricular size is normal. There  is normal pulmonary artery systolic pressure. The estimated right ventricular systolic pressure is 29.0 mmHg.   3. The mitral valve is normal in structure. No evidence of mitral valve regurgitation. No evidence of mitral stenosis.   4. Tricuspid valve regurgitation is moderate.   5. The aortic valve is normal in structure. Aortic valve regurgitation is not visualized. No aortic stenosis is present.   6. There is borderline dilatation of the aortic root, measuring 40 mm.   7. The inferior vena cava is normal in size with greater than 50% respiratory variability, suggesting right atrial pressure of 3 mmHg.     Assessment and Plan     #) parox afib #) inducible VT s/p ICD  #) inappropriate shock d/t aflutter vs atach #) HFrEF #) hypothyroid S/p EPS with inducible VT and now s/p dual chamber ICD Recent HV therapy for what appears to be Aflutter vs Atach with CL falling into the VT zone at ~200bpm Recently stopped amiodarone  for GI side effects, he does not want to restart amio Continue 25mg  metoprolol  BID OK to drive     #) Hypercoag d/t parox afib CHA2DS2-VASc Score = at least 4 [CHF History: 1, HTN History: 1, Diabetes History: 0, Stroke History: 0, Vascular Disease History: 1, Age Score: 1, Gender Score: 0].  Therefore, the patient's annual risk of stroke is 4.8 %.    Stroke ppx - 5mg  eliquis  BID, appropriately dosed No bleeding concerns     Current medicines are reviewed at length with the patient today.   The patient has concerns regarding his medicines.  The following changes were made today:   none    Labs/ tests ordered today include:  No orders of the defined types were placed in this encounter.    Disposition: Follow up with Dr. Marven Slimmer or EP APP in 3 months    Signed, Vanderbilt Ranieri, NP  04/10/24  1:01 PM  Electrophysiology [facility]

## 2024-04-10 ENCOUNTER — Ambulatory Visit: Attending: Cardiology | Admitting: Cardiology

## 2024-04-10 ENCOUNTER — Encounter: Payer: Self-pay | Admitting: Cardiology

## 2024-04-10 ENCOUNTER — Ambulatory Visit: Payer: Self-pay | Admitting: Cardiology

## 2024-04-10 VITALS — BP 106/72 | HR 74 | Ht 71.0 in | Wt 186.4 lb

## 2024-04-10 DIAGNOSIS — I471 Supraventricular tachycardia, unspecified: Secondary | ICD-10-CM | POA: Diagnosis not present

## 2024-04-10 DIAGNOSIS — Z95810 Presence of automatic (implantable) cardiac defibrillator: Secondary | ICD-10-CM | POA: Diagnosis not present

## 2024-04-10 DIAGNOSIS — I472 Ventricular tachycardia, unspecified: Secondary | ICD-10-CM

## 2024-04-10 LAB — CUP PACEART INCLINIC DEVICE CHECK
Date Time Interrogation Session: 20250528130718
Implantable Lead Connection Status: 753985
Implantable Lead Connection Status: 753985
Implantable Lead Implant Date: 20240819
Implantable Lead Implant Date: 20240819
Implantable Lead Location: 753859
Implantable Lead Location: 753860
Implantable Lead Model: 5076
Implantable Pulse Generator Implant Date: 20240819

## 2024-04-10 NOTE — Patient Instructions (Signed)
 Medication Instructions:  Your physician recommends that you continue on your current medications as directed. Please refer to the Current Medication list given to you today.  *If you need a refill on your cardiac medications before your next appointment, please call your pharmacy*  Lab Work: No labs ordered today  If you have labs (blood work) drawn today and your tests are completely normal, you will receive your results only by: MyChart Message (if you have MyChart) OR A paper copy in the mail If you have any lab test that is abnormal or we need to change your treatment, we will call you to review the results.  Testing/Procedures: No test ordered today   Follow-Up: At [facility], you and your health needs are our priority.  As part of our continuing mission to provide you with exceptional heart care, our providers are all part of one team.  This team includes your primary Cardiologist (physician) and Advanced Practice Providers or APPs (Physician Assistants and Nurse Practitioners) who all work together to provide you with the care you need, when you need it.  Your next appointment:   3 month(s)  Provider:   You may see Boyce Byes, MD or one of the following Advanced Practice Providers on your designated Care Team:   Mertha Abrahams, [place] [address] [place], PA-C Suzann Riddle, NP Creighton Doffing, NP

## 2024-04-11 DIAGNOSIS — N2581 Secondary hyperparathyroidism of renal origin: Secondary | ICD-10-CM | POA: Insufficient documentation

## 2024-04-26 ENCOUNTER — Other Ambulatory Visit: Payer: Self-pay | Admitting: Nurse Practitioner

## 2024-04-29 NOTE — Telephone Encounter (Signed)
 Requested medications are due for refill today.  yes  Requested medications are on the active medications list.  yes  Last refill. 03/13/2024 #30 0 rf  Future visit scheduled.   no  Notes to clinic.  Medication not assigned to a protocol.Please review for refill.    Requested Prescriptions  Pending Prescriptions Disp Refills   BELSOMRA  10 MG TABS [Pharmacy Med Name: BELSOMRA  10 MG TABLET] 30 tablet 0    Sig: Take 1 tablet (10 mg total) by mouth at bedtime as needed.     Off-Protocol Failed - 04/29/2024  1:59 PM      Failed - Medication not assigned to a protocol, review manually.      Passed - Valid encounter within last 12 months    Recent Outpatient Visits           1 month ago Major depressive disorder with single episode, in partial remission (HCC)   [place] [facility] [place], [place] T, NP   2 months ago Slow transit constipation   [place] [facility] [place], [place] T, NP   3 months ago Major depressive disorder with single episode, in partial remission (HCC)   [place] [facility] [place], [place] T, NP   3 months ago Major depressive disorder with single episode, in partial remission (HCC)    [facility] Lemar Pyles, NP       Future Appointments             In 2 months Riddle, Suzann, NP [facility]

## 2024-05-04 NOTE — Patient Instructions (Signed)
 Be Involved in Caring For Your Health:  Taking Medications When medications are taken as directed, they can greatly improve your health. But if they are not taken as prescribed, they may not work. In some cases, not taking them correctly can be harmful. To help ensure your treatment remains effective and safe, understand your medications and how to take them. Bring your medications to each visit for review by your provider.  Your lab results, notes, and after visit summary will be available on My Chart. We strongly encourage you to use this feature. If lab results are abnormal the clinic will contact you with the appropriate steps. If the clinic does not contact you assume the results are satisfactory. You can always view your results on My Chart. If you have questions regarding your health or results, please contact the clinic during office hours. You can also ask questions on My Chart.  We at [facility] are grateful that you chose [place] to provide your care. We strive to provide evidence-based and compassionate care and are always looking for feedback. If you get a survey from the clinic please complete this so we can hear your opinions.  Heart-Healthy Eating Plan Many factors influence your heart health, including eating and exercise habits. Heart health is also called coronary health. Coronary risk increases with abnormal blood fat (lipid) levels. A heart-healthy eating plan includes limiting unhealthy fats, increasing healthy fats, limiting salt (sodium) intake, and making other diet and lifestyle changes. What is my plan? Your health care provider may recommend that: You limit your fat intake to _________% or less of your total calories each day. You limit your saturated fat intake to _________% or less of your total calories each day. You limit the amount of cholesterol in your diet to less than _________ mg per day. You limit the amount of sodium in your diet to less than _________  mg per day. What are tips for following this plan? Cooking Cook foods using methods other than frying. Baking, boiling, grilling, and broiling are all good options. Other ways to reduce fat include: Removing the skin from poultry. Removing all visible fats from meats. Steaming vegetables in water or broth. Meal planning  At meals, imagine dividing your plate into fourths: Fill one-half of your plate with vegetables and green salads. Fill one-fourth of your plate with whole grains. Fill one-fourth of your plate with lean protein foods. Eat 2-4 cups of vegetables per day. One cup of vegetables equals 1 cup (91 g) broccoli or cauliflower florets, 2 medium carrots, 1 large bell pepper, 1 large sweet potato, 1 large tomato, 1 medium white potato, 2 cups (150 g) raw leafy greens. Eat 1-2 cups of fruit per day. One cup of fruit equals 1 small apple, 1 large banana, 1 cup (237 g) mixed fruit, 1 large orange,  cup (82 g) dried fruit, 1 cup (240 mL) 100% fruit juice. Eat more foods that contain soluble fiber. Examples include apples, broccoli, carrots, beans, peas, and barley. Aim to get 25-30 g of fiber per day. Increase your consumption of legumes, nuts, and seeds to 4-5 servings per week. One serving of dried beans or legumes equals  cup (90 g) cooked, 1 serving of nuts is  oz (12 almonds, 24 pistachios, or 7 walnut halves), and 1 serving of seeds equals  oz (8 g). Fats Choose healthy fats more often. Choose monounsaturated and polyunsaturated fats, such as olive and canola oils, avocado oil, flaxseeds, walnuts, almonds, and seeds. Eat  more omega-3 fats. Choose salmon, mackerel, sardines, tuna, flaxseed oil, and ground flaxseeds. Aim to eat fish at least 2 times each week. Check food labels carefully to identify foods with trans fats or high amounts of saturated fat. Limit saturated fats. These are found in animal products, such as meats, butter, and cream. Plant sources of saturated fats  include palm oil, palm kernel oil, and coconut oil. Avoid foods with partially hydrogenated oils in them. These contain trans fats. Examples are stick margarine, some tub margarines, cookies, crackers, and other baked goods. Avoid fried foods. General information Eat more home-cooked food and less restaurant, buffet, and fast food. Limit or avoid alcohol. Limit foods that are high in added sugar and simple starches such as foods made using white refined flour (white breads, pastries, sweets). Lose weight if you are overweight. Losing just 5-10% of your body weight can help your overall health and prevent diseases such as diabetes and heart disease. Monitor your sodium intake, especially if you have high blood pressure. Talk with your health care provider about your sodium intake. Try to incorporate more vegetarian meals weekly. What foods should I eat? Fruits All fresh, canned (in natural juice), or frozen fruits. Vegetables Fresh or frozen vegetables (raw, steamed, roasted, or grilled). Green salads. Grains Most grains. Choose whole wheat and whole grains most of the time. Rice and pasta, including brown rice and pastas made with whole wheat. Meats and other proteins Lean, well-trimmed beef, veal, pork, and lamb. Chicken and [place] without skin. All fish and shellfish. Wild duck, rabbit, pheasant, and venison. Egg whites or low-cholesterol egg substitutes. Dried beans, peas, lentils, and tofu. Seeds and most nuts. Dairy Low-fat or nonfat cheeses, including ricotta and mozzarella. Skim or 1% milk (liquid, powdered, or evaporated). Buttermilk made with low-fat milk. Nonfat or low-fat yogurt. Fats and oils Non-hydrogenated (trans-free) margarines. Vegetable oils, including soybean, sesame, sunflower, olive, avocado, peanut, safflower, corn, canola, and cottonseed. Salad dressings or mayonnaise made with a vegetable oil. Beverages Water (mineral or sparkling). Coffee and tea. Unsweetened ice  tea. Diet beverages. Sweets and desserts Sherbet, gelatin, and fruit ice. Small amounts of dark chocolate. Limit all sweets and desserts. Seasonings and condiments All seasonings and condiments. The items listed above may not be a complete list of foods and beverages you can eat. Contact a dietitian for more options. What foods should I avoid? Fruits Canned fruit in heavy syrup. Fruit in cream or butter sauce. Fried fruit. Limit coconut. Vegetables Vegetables cooked in cheese, cream, or butter sauce. Fried vegetables. Grains Breads made with saturated or trans fats, oils, or whole milk. Croissants. Sweet rolls. Donuts. High-fat crackers, such as cheese crackers and chips. Meats and other proteins Fatty meats, such as hot dogs, ribs, sausage, bacon, rib-eye roast or steak. High-fat deli meats, such as salami and bologna. Caviar. Domestic duck and goose. Organ meats, such as liver. Dairy Cream, sour cream, cream cheese, and creamed cottage cheese. Whole-milk cheeses. Whole or 2% milk (liquid, evaporated, or condensed). Whole buttermilk. Cream sauce or high-fat cheese sauce. Whole-milk yogurt. Fats and oils Meat fat, or shortening. Cocoa butter, hydrogenated oils, palm oil, coconut oil, palm kernel oil. Solid fats and shortenings, including bacon fat, salt pork, lard, and butter. Nondairy cream substitutes. Salad dressings with cheese or sour cream. Beverages Regular sodas and any drinks with added sugar. Sweets and desserts Frosting. Pudding. Cookies. Cakes. Pies. Milk chocolate or white chocolate. Buttered syrups. Full-fat ice cream or ice cream drinks. The items listed above may  not be a complete list of foods and beverages to avoid. Contact a dietitian for more information. Summary Heart-healthy meal planning includes limiting unhealthy fats, increasing healthy fats, limiting salt (sodium) intake and making other diet and lifestyle changes. Lose weight if you are overweight. Losing just  5-10% of your body weight can help your overall health and prevent diseases such as diabetes and heart disease. Focus on eating a balance of foods, including fruits and vegetables, low-fat or nonfat dairy, lean protein, nuts and legumes, whole grains, and heart-healthy oils and fats. This information is not intended to replace advice given to you by your health care provider. Make sure you discuss any questions you have with your health care provider. Document Revised: 12/06/2021 Document Reviewed: 12/06/2021 Elsevier Patient Education  2024 [employer].

## 2024-05-08 ENCOUNTER — Encounter: Payer: Self-pay | Admitting: Nurse Practitioner

## 2024-05-08 ENCOUNTER — Ambulatory Visit: Admitting: Nurse Practitioner

## 2024-05-08 VITALS — BP 102/63 | HR 69 | Temp 97.4°F | Ht 71.0 in | Wt 185.8 lb

## 2024-05-08 DIAGNOSIS — I480 Paroxysmal atrial fibrillation: Secondary | ICD-10-CM

## 2024-05-08 DIAGNOSIS — I7143 Infrarenal abdominal aortic aneurysm, without rupture: Secondary | ICD-10-CM

## 2024-05-08 DIAGNOSIS — R7301 Impaired fasting glucose: Secondary | ICD-10-CM

## 2024-05-08 DIAGNOSIS — E039 Hypothyroidism, unspecified: Secondary | ICD-10-CM

## 2024-05-08 DIAGNOSIS — E782 Mixed hyperlipidemia: Secondary | ICD-10-CM

## 2024-05-08 DIAGNOSIS — F324 Major depressive disorder, single episode, in partial remission: Secondary | ICD-10-CM

## 2024-05-08 DIAGNOSIS — N1831 Chronic kidney disease, stage 3a: Secondary | ICD-10-CM

## 2024-05-08 DIAGNOSIS — N183 Chronic kidney disease, stage 3 unspecified: Secondary | ICD-10-CM

## 2024-05-08 DIAGNOSIS — I5020 Unspecified systolic (congestive) heart failure: Secondary | ICD-10-CM | POA: Diagnosis not present

## 2024-05-08 DIAGNOSIS — I129 Hypertensive chronic kidney disease with stage 1 through stage 4 chronic kidney disease, or unspecified chronic kidney disease: Secondary | ICD-10-CM

## 2024-05-08 DIAGNOSIS — I472 Ventricular tachycardia, unspecified: Secondary | ICD-10-CM

## 2024-05-08 DIAGNOSIS — F5104 Psychophysiologic insomnia: Secondary | ICD-10-CM

## 2024-05-08 LAB — MICROALBUMIN, URINE WAIVED
Creatinine, Urine Waived: 200 mg/dL (ref 10–300)
Microalb, Ur Waived: 30 mg/L — ABNORMAL HIGH (ref 0–19)
Microalb/Creat Ratio: <30 mg/g (ref <30)

## 2024-05-08 MED ORDER — BELSOMRA 15 MG PO TABS: 15.0000 mg | ORAL_TABLET | Freq: Every evening | ORAL | 5 refills | Status: DC | PRN

## 2024-05-08 MED ORDER — OMEPRAZOLE 20 MG PO CPDR: 20.0000 mg | DELAYED_RELEASE_CAPSULE | Freq: Every day | ORAL | 4 refills | Status: AC

## 2024-05-08 NOTE — Assessment & Plan Note (Signed)
 Chronic, ongoing.  Rate controlled at this time.  Is now off Amiodarone  and reports feeling much better.  Continue  collaboration with cardiology, recent notes reviewed. Labs today.

## 2024-05-08 NOTE — Assessment & Plan Note (Signed)
 Chronic, stable.  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  Continue Wellbutrin  as ordered (max dose), Buspar  20 MG TID (max dose) + Effexor  37.5 MG daily.  Denies SI/HI.  Monitor Wellbutrin  and Toprol  XL use -- discussed may need to consider mood medication adjustments in future.  Would benefit therapy in future.  Since is stable at present will maintain regimen. Could consider return to Celexa  in future since is off Amiodarone .

## 2024-05-08 NOTE — Assessment & Plan Note (Signed)
 Chronic, ongoing.  Euvolemic on exam today. Recent EF stable at 40-45% July 2024.  Recommend: - Reminded to call for an overnight weight gain of >2 pounds or a weekly weight gain of >5 pounds - not adding salt to food and read food labels. Reviewed the importance of keeping daily sodium intake to 2000mg  daily. - Avoid NSAIDS

## 2024-05-08 NOTE — Assessment & Plan Note (Signed)
 Chronic, stable.  BP well below goal for age today.  Monitor closely as is on Toprol  XL and Wellbutrin .  Recommend he monitor BP at least a few mornings a week at home and document.  DASH diet at home.  Continue current medication regimen and adjust as needed.  Labs today: CBC, CMP, TSH. Urine ALB 13 May 2024.

## 2024-05-08 NOTE — Assessment & Plan Note (Signed)
 Ongoing. Tried Melatonin without benefit.  Using good sleep techniques. Tolerating Belsomra , tried other medications without benefit.  Will increase to 15 MG nightly.  Plan to use for shortest period possible.  Would benefit therapy time which may help sleep pattern or sleep study.

## 2024-05-08 NOTE — Progress Notes (Signed)
 BP 102/63   Pulse 69   Temp (!) 97.4 F (36.3 C) (Oral)   Ht 5' 11 (1.803 m)   Wt 185 lb 12.8 oz (84.3 kg)   SpO2 96%   BMI 25.91 kg/m    Subjective:    Patient ID: Stuart Harvey, male    DOB: 1951-05-09, 73 y.o.   MRN: [id number]  HPI: Stuart Harvey is a 73 y.o. male  Chief Complaint  Patient presents with   Hyperlipidemia   Hypertension   Hypothyroidism   HYPERTENSION / HYPERLIPIDEMIA/HF Taking Eliquis , Zetia , Metoprolol , Entresto , and Crestor . Saw cardiology last 04/10/24 and Metoprolol  XL was increased, which he is tolerating. Follows with vascular and cardiology for AAA. Satisfied with current treatment? yes Duration of hypertension: chronic BP monitoring frequency: daily BP range: <130/80 range BP medication side effects: no Duration of hyperlipidemia: chronic Cholesterol medication side effects: no Cholesterol supplements: none Medication compliance: good compliance Aspirin : no Recent stressors: no Recurrent headaches: no Visual changes: no Palpitations: no Dyspnea: occasional with heavy exertion Chest pain: no Lower extremity edema: occasional  Dizzy/lightheaded: when gets up too fast   HYPOTHYROIDISM Taking Levothyroxine  150 MCG daily, is off Amiodarone  now. States he feels much better off Amiodarone . Thyroid  control status:stable Satisfied with current treatment? yes Medication side effects: no Medication compliance: good compliance Etiology of hypothyroidism: suspect related to Amiodarone  Recent dose adjustment:no Fatigue: occasional Cold intolerance: no Heat intolerance: no Weight gain: no Weight loss: no Constipation: no Diarrhea/loose stools: no Palpitations: no Lower extremity edema: occasional Anxiety/depressed mood: no   CHRONIC KIDNEY DISEASE CKD status: stable Medications renally dose: yes Previous renal evaluation: no Pneumovax:  Up to Date Influenza Vaccine:  Up to Date   Impaired Fasting Glucose HbA1C:  Lab Results   Component Value Date   HGBA1C 5.7 (H) 05/20/2023  Duration of elevated blood sugar: years Polydipsia: no Polyuria: no Weight change: no Visual disturbance: no Glucose Monitoring: no    Accucheck frequency: Not Checking    Fasting glucose:     Post prandial:  Diabetic Education: Not Completed Family history of diabetes: no   ANXIETY/STRESS Taking Effexor  XR, Wellbutrin , Buspar , and Belsomra . Continues to have issues with sleep. Does tolerate Belsomra . Duration:controlled Anxious mood: occasional  Excessive worrying: occasional concerns Irritability: sometimes  Sweating: no Nausea: no Palpitations:no Hyperventilation: no Panic attacks: no Agoraphobia: no  Obscessions/compulsions: no Depressed mood: no    05/08/2024    2:39 PM 02/01/2024   11:30 AM 11/14/2023   11:19 AM 10/24/2023    3:08 PM 09/26/2023    3:59 PM  Depression screen PHQ 2/9  Decreased Interest 0 1 1 2  0  Down, Depressed, Hopeless 1 1 1 2 1   PHQ - 2 Score 1 2 2 4 1   Altered sleeping 3 3 1 2 2   Tired, decreased energy 2 1 1 2 2   Change in appetite 1 1 0 1 1  Feeling bad or failure about yourself  0 0 0 0 0  Trouble concentrating 2 1 1 2 1   Moving slowly or fidgety/restless 0 0 0 1 1  Suicidal thoughts 0 0 0 0 0  PHQ-9 Score 9 8 5 12 8   Difficult doing work/chores Somewhat difficult  Not difficult at all Somewhat difficult Somewhat difficult  Anhedonia: no Weight changes: no Insomnia: yes hard to fall asleep  Hypersomnia: no Fatigue/loss of energy: no Feelings of worthlessness: no Feelings of guilt: no Impaired concentration/indecisiveness: no Suicidal ideations: no  Crying spells: no Recent Stressors/Life  Changes: yes   Relationship problems: no   Family stress: yes  granddaughter   Financial stress: no    Job stress: no    Recent death/loss: no     10-May-2024    2:40 PM 02/01/2024   11:31 AM 10/24/2023    3:08 PM 09/26/2023    4:00 PM  GAD 7 : Generalized Anxiety Score  Nervous,  Anxious, on Edge 1 1 2 1   Control/stop worrying 1 1 1 1   Worry too much - different things 1 2 1 1   Trouble relaxing 1 1 1 1   Restless 0 1 1 1   Easily annoyed or irritable 0 1 1 1   Afraid - awful might happen 1 1 1 2   Total GAD 7 Score 5 8 8 8   Anxiety Difficulty Somewhat difficult Somewhat difficult Somewhat difficult Somewhat difficult   Relevant past medical, surgical, family and social history reviewed and updated as indicated. Interim medical history since our last visit reviewed. Allergies and medications reviewed and updated.  Review of Systems  Constitutional:  Negative for activity change, diaphoresis, fatigue and fever.  Respiratory:  Negative for cough, chest tightness, shortness of breath and wheezing.   Cardiovascular:  Negative for chest pain, palpitations and leg swelling.  Gastrointestinal: Negative.   Endocrine: Negative for cold intolerance, heat intolerance, polydipsia, polyphagia and polyuria.  Neurological: Negative.   Psychiatric/Behavioral:  Positive for sleep disturbance. Negative for decreased concentration, self-injury and suicidal ideas. The patient is nervous/anxious (occasional, improved).    Per HPI unless specifically indicated above     Objective:    BP 102/63   Pulse 69   Temp (!) 97.4 F (36.3 C) (Oral)   Ht 5' 11 (1.803 m)   Wt 185 lb 12.8 oz (84.3 kg)   SpO2 96%   BMI 25.91 kg/m   Wt Readings from Last 3 Encounters:  10-May-2024 185 lb 12.8 oz (84.3 kg)  04/10/24 186 lb 6.4 oz (84.6 kg)  04/04/24 187 lb 6.4 oz (85 kg)    Physical Exam Vitals and nursing note reviewed.  Constitutional:      General: He is awake. He is not in acute distress.    Appearance: Normal appearance. He is well-developed and well-groomed. He is not ill-appearing or toxic-appearing.  HENT:     Head: Normocephalic.     Right Ear: Hearing and external ear normal.     Left Ear: Hearing and external ear normal.   Eyes:     General: Lids are normal.      Extraocular Movements: Extraocular movements intact.     Conjunctiva/sclera: Conjunctivae normal.   Neck:     Thyroid : No thyromegaly.     Vascular: No carotid bruit.   Cardiovascular:     Rate and Rhythm: Normal rate and regular rhythm.     Heart sounds: Normal heart sounds. No murmur heard.    No gallop.  Pulmonary:     Effort: No accessory muscle usage or respiratory distress.     Breath sounds: Normal breath sounds. No decreased breath sounds, wheezing or rales.  Abdominal:     General: Bowel sounds are normal. There is no distension.     Palpations: Abdomen is soft. There is no hepatomegaly.     Tenderness: There is no abdominal tenderness.   Musculoskeletal:     Cervical back: Full passive range of motion without pain.     Right lower leg: No edema.     Left lower leg: No edema.  Lymphadenopathy:  Cervical: No cervical adenopathy.   Skin:    General: Skin is warm.     Capillary Refill: Capillary refill takes less than 2 seconds.   Neurological:     Mental Status: He is alert and oriented to person, place, and time.     Cranial Nerves: Cranial nerves 2-12 are intact.     Gait: Gait is intact.     Deep Tendon Reflexes: Reflexes are normal and symmetric.     Reflex Scores:      Brachioradialis reflexes are 2+ on the right side and 2+ on the left side.      Patellar reflexes are 2+ on the right side and 2+ on the left side.  Psychiatric:        Attention and Perception: Attention normal.        Mood and Affect: Mood normal.        Speech: Speech normal.        Behavior: Behavior normal. Behavior is cooperative.        Thought Content: Thought content normal.    Results for orders placed or performed in visit on 05/08/24  Microalbumin, Urine Waived   Collection Time: 05/08/24  2:48 PM  Result Value Ref Range   Microalb, Ur Waived 30 (H) 0 - 19 mg/L   Creatinine, Urine Waived 200 10 - 300 mg/dL   Microalb/Creat Ratio <30 <30 mg/g      Assessment & Plan:    Problem List Items Addressed This Visit       Cardiovascular and Mediastinum   Paroxysmal atrial fibrillation (HCC)   Chronic, ongoing.  Rate controlled at this time.  Is now off Amiodarone  and reports feeling much better.  Continue  collaboration with cardiology, recent notes reviewed. Labs today.      Heart failure with reduced ejection fraction (HCC) - Primary   Chronic, ongoing.  Euvolemic on exam today. Recent EF stable at 40-45% July 2024.  Recommend: - Reminded to call for an overnight weight gain of >2 pounds or a weekly weight gain of >5 pounds - not adding salt to food and read food labels. Reviewed the importance of keeping daily sodium intake to 2000mg  daily. - Avoid NSAIDS      Relevant Orders   CBC with Differential/Platelet   Comprehensive metabolic panel with GFR   Benign hypertension with CKD (chronic kidney disease) stage III (HCC)   Chronic, stable.  BP well below goal for age today.  Monitor closely as is on Toprol  XL and Wellbutrin .  Recommend he monitor BP at least a few mornings a week at home and document.  DASH diet at home.  Continue current medication regimen and adjust as needed.  Labs today: CBC, CMP, TSH. Urine ALB 13 May 2024.       Relevant Orders   Microalbumin, Urine Waived (Completed)   AAA (abdominal aortic aneurysm) (HCC)   Followed by cardiology and vascular.  History of stent in 2014.  Continue to collaborate with team members.  Recent notes reviewed.        Endocrine   IFG (impaired fasting glucose)   Recheck A1c today, continue diet focus.      Relevant Orders   HgB A1c   Hypothyroid   Chronic, ongoing.  Continue current medication regimen and adjust as needed.  TSH and Free T4 today.      Relevant Orders   TSH   T4, free     Genitourinary   CKD (chronic kidney disease) stage 3,  GFR 30-59 ml/min (HCC)   Chronic, ongoing CKD 3a. Followed by nephrology.  Continue current medication regimen and adjust as needed.  Labs: CMP and  TSH.  Urine ALB 13 May 2024.      Relevant Orders   Microalbumin, Urine Waived (Completed)   CBC with Differential/Platelet   Comprehensive metabolic panel with GFR     Other   Insomnia   Ongoing. Tried Melatonin without benefit.  Using good sleep techniques. Tolerating Belsomra , tried other medications without benefit.  Will increase to 15 MG nightly.  Plan to use for shortest period possible.  Would benefit therapy time which may help sleep pattern or sleep study.      Hyperlipidemia   Chronic, stable.  Continue current medication regimen and adjust as needed.  Lipid panel today.      Relevant Orders   Comprehensive metabolic panel with GFR   Lipid Panel w/o Chol/HDL Ratio   Depression   Chronic, stable.  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  Continue Wellbutrin  as ordered (max dose), Buspar  20 MG TID (max dose) + Effexor  37.5 MG daily.  Denies SI/HI.  Monitor Wellbutrin  and Toprol  XL use -- discussed may need to consider mood medication adjustments in future.  Would benefit therapy in future.  Since is stable at present will maintain regimen. Could consider return to Celexa  in future since is off Amiodarone .        Follow up plan: Return in about 6 weeks (around 06/19/2024) for Insomnia increased Belsomra  and then 6 month follow-up CKD, HTN/HLD/HF, MOOD, THYROID .

## 2024-05-08 NOTE — Assessment & Plan Note (Signed)
 Chronic, ongoing CKD 3a. Followed by nephrology.  Continue current medication regimen and adjust as needed.  Labs: CMP and TSH.  Urine ALB 13 May 2024.

## 2024-05-08 NOTE — Assessment & Plan Note (Signed)
 Chronic, stable.  Continue current medication regimen and adjust as needed.  Lipid panel today.

## 2024-05-08 NOTE — Assessment & Plan Note (Signed)
Followed by cardiology and vascular.  History of stent in 2014.  Continue to collaborate with team members.  Recent notes reviewed.

## 2024-05-08 NOTE — Assessment & Plan Note (Signed)
Recheck A1c today, continue diet focus. 

## 2024-05-08 NOTE — Assessment & Plan Note (Signed)
Chronic, ongoing.  Continue current medication regimen and adjust as needed.  TSH and Free T4 today. 

## 2024-05-09 ENCOUNTER — Ambulatory Visit: Payer: Self-pay | Admitting: Nurse Practitioner

## 2024-05-09 LAB — COMPREHENSIVE METABOLIC PANEL WITH GFR
ALT: 12 IU/L (ref 0–44)
AST: 25 IU/L (ref 0–40)
Albumin: 4.2 g/dL (ref 3.8–4.8)
Alkaline Phosphatase: 111 IU/L (ref 44–121)
BUN/Creatinine Ratio: 17 (ref 10–24)
BUN: 29 mg/dL — ABNORMAL HIGH (ref 8–27)
Bilirubin Total: 0.5 mg/dL (ref 0.0–1.2)
CO2: 18 mmol/L — ABNORMAL LOW (ref 20–29)
Calcium: 9.7 mg/dL (ref 8.6–10.2)
Chloride: 108 mmol/L — ABNORMAL HIGH (ref 96–106)
Creatinine, Ser: 1.71 mg/dL — ABNORMAL HIGH (ref 0.76–1.27)
Globulin, Total: 2.3 g/dL (ref 1.5–4.5)
Glucose: 96 mg/dL (ref 70–99)
Potassium: 4.9 mmol/L (ref 3.5–5.2)
Sodium: 142 mmol/L (ref 134–144)
Total Protein: 6.5 g/dL (ref 6.0–8.5)
eGFR: 42 mL/min/{1.73_m2} — ABNORMAL LOW (ref >59)

## 2024-05-09 LAB — T4, FREE: Free T4: 1.77 ng/dL (ref 0.82–1.77)

## 2024-05-09 LAB — CBC WITH DIFFERENTIAL/PLATELET
Basophils Absolute: 0.1 10*3/uL (ref 0.0–0.2)
Basos: 1 %
EOS (ABSOLUTE): 0.4 10*3/uL (ref 0.0–0.4)
Eos: 4 %
Hematocrit: 42.9 % (ref 37.5–51.0)
Hemoglobin: 13.5 g/dL (ref 13.0–17.7)
Immature Grans (Abs): 0 10*3/uL (ref 0.0–0.1)
Immature Granulocytes: 0 %
Lymphocytes Absolute: 4.5 10*3/uL — ABNORMAL HIGH (ref 0.7–3.1)
Lymphs: 43 %
MCH: 28.5 pg (ref 26.6–33.0)
MCHC: 31.5 g/dL (ref 31.5–35.7)
MCV: 91 fL (ref 79–97)
Monocytes Absolute: 0.7 10*3/uL (ref 0.1–0.9)
Monocytes: 7 %
Neutrophils Absolute: 4.8 10*3/uL (ref 1.4–7.0)
Neutrophils: 45 %
Platelets: 182 10*3/uL (ref 150–450)
RBC: 4.73 x10E6/uL (ref 4.14–5.80)
RDW: 13.2 % (ref 11.6–15.4)
WBC: 10.5 10*3/uL (ref 3.4–10.8)

## 2024-05-09 LAB — LIPID PANEL W/O CHOL/HDL RATIO
Cholesterol, Total: 135 mg/dL (ref 100–199)
HDL: 48 mg/dL (ref >39)
LDL Chol Calc (NIH): 66 mg/dL (ref 0–99)
Triglycerides: 120 mg/dL (ref 0–149)
VLDL Cholesterol Cal: 21 mg/dL (ref 5–40)

## 2024-05-09 LAB — HEMOGLOBIN A1C
Est. average glucose Bld gHb Est-mCnc: 128 mg/dL
Hgb A1c MFr Bld: 6.1 % — ABNORMAL HIGH (ref 4.8–5.6)

## 2024-05-09 LAB — TSH: TSH: 0.666 u[IU]/mL (ref 0.450–4.500)

## 2024-05-09 NOTE — Progress Notes (Signed)
 Contacted via MyChart  Good afternoon Stuart Harvey, your labs have returned: - CBC overall stable with only mild elevation in lymphocytes which we can continue to monitor. - Kidney function, creatinine and eGFR, shows Stage 3a kidney disease.  Please ensure good water intake daily. - A1c, diabetes testing, shows a little trend up to 6.1% (still prediabetic).  Continue to focus on healthy diet and regular activity. - Remainder of labs look great, including thyroid !!  [employer].  No medication changes at this time.  Any questions? Keep being amazing!!  Thank you for allowing me to participate in your care.  I appreciate you. Kindest regards, [name]

## 2024-05-14 ENCOUNTER — Encounter: Payer: Self-pay | Admitting: Nurse Practitioner

## 2024-05-15 ENCOUNTER — Ambulatory Visit: Admitting: Cardiology

## 2024-05-18 ENCOUNTER — Other Ambulatory Visit: Payer: Self-pay | Admitting: Nurse Practitioner

## 2024-05-21 NOTE — Telephone Encounter (Signed)
 Requested Prescriptions  Pending Prescriptions Disp Refills   busPIRone  (BUSPAR ) 10 MG tablet [Pharmacy Med Name: BUSPIRONE  HCL 10 MG TABLET] 540 tablet 1    Sig: Take 2 tablets (20 mg total) by mouth 3 (three) times daily.     Psychiatry: Anxiolytics/Hypnotics - Non-controlled Passed - 05/21/2024 12:39 PM      Passed - Valid encounter within last 12 months    Recent Outpatient Visits           1 week ago Heart failure with reduced ejection fraction (HCC)   [facility] [facility] [place], [place] T, NP   2 months ago Major depressive disorder with single episode, in partial remission (HCC)   [place] [facility] [place], [place] T, NP   3 months ago Slow transit constipation   [place] [facility] [place], [place] T, NP   4 months ago Major depressive disorder with single episode, in partial remission (HCC)   [place] [facility] [place], Evan T, NP   4 months ago Major depressive disorder with single episode, in partial remission (HCC)   [place] [facility] [place], Melanie DASEN, NP       Future Appointments             In 1 week Hammock, Tylene, NP [facility] [place]   In 1 month Riddle, Suzann, NP  [facility]

## 2024-05-28 ENCOUNTER — Encounter: Payer: Self-pay | Admitting: Nurse Practitioner

## 2024-05-28 NOTE — Progress Notes (Signed)
 Remote ICD transmission.

## 2024-05-28 NOTE — Addendum Note (Signed)
 Addended by: VICCI SELLER A on: 05/28/2024 09:54 AM   Modules accepted: Orders

## 2024-05-29 ENCOUNTER — Ambulatory Visit: Attending: Cardiology | Admitting: Cardiology

## 2024-05-29 ENCOUNTER — Encounter: Payer: Self-pay | Admitting: Cardiology

## 2024-05-29 VITALS — BP 90/58 | HR 68 | Ht 71.0 in | Wt 187.0 lb

## 2024-05-29 DIAGNOSIS — I255 Ischemic cardiomyopathy: Secondary | ICD-10-CM

## 2024-05-29 DIAGNOSIS — I2510 Atherosclerotic heart disease of native coronary artery without angina pectoris: Secondary | ICD-10-CM

## 2024-05-29 DIAGNOSIS — I480 Paroxysmal atrial fibrillation: Secondary | ICD-10-CM | POA: Diagnosis not present

## 2024-05-29 DIAGNOSIS — Z951 Presence of aortocoronary bypass graft: Secondary | ICD-10-CM

## 2024-05-29 DIAGNOSIS — I472 Ventricular tachycardia, unspecified: Secondary | ICD-10-CM | POA: Diagnosis not present

## 2024-05-29 DIAGNOSIS — E785 Hyperlipidemia, unspecified: Secondary | ICD-10-CM | POA: Diagnosis not present

## 2024-05-29 DIAGNOSIS — E782 Mixed hyperlipidemia: Secondary | ICD-10-CM

## 2024-05-29 DIAGNOSIS — Z95810 Presence of automatic (implantable) cardiac defibrillator: Secondary | ICD-10-CM

## 2024-05-29 DIAGNOSIS — I5020 Unspecified systolic (congestive) heart failure: Secondary | ICD-10-CM | POA: Diagnosis not present

## 2024-05-29 DIAGNOSIS — N1831 Chronic kidney disease, stage 3a: Secondary | ICD-10-CM

## 2024-05-29 MED ORDER — SACUBITRIL-VALSARTAN 24-26 MG PO TABS: 1.0000 | ORAL_TABLET | Freq: Two times a day (BID) | ORAL | 3 refills | Status: DC

## 2024-05-29 MED ORDER — APIXABAN 5 MG PO TABS: 5.0000 mg | ORAL_TABLET | Freq: Two times a day (BID) | ORAL | 3 refills | Status: AC

## 2024-05-29 NOTE — Progress Notes (Signed)
 Cardiology Office Note   Date:  05/29/2024  ID:  Stuart Harvey, DOB 1950/12/27, MRN [id number] PCP: Valerio Melanie DASEN, NP  [facility] Providers Cardiologist:  Deatrice Cage, MD Electrophysiologist:  OLE DASEN HOLTS, MD     History of Present Illness Stuart Harvey is a 73 y.o. male with a past medical history of coronary artery disease status post CABG x 4 (2008), chronic HFrEF/ICM, PAF on apixaban , hypertension, hyperlipidemia, infrarenal AAA status post open repair, right iliac artery aneurysm status post EVAR (07/2021), hypothyroidism, anemia, depression, GERD, VT arrest status post ICD (06/2023), who is here today for follow-up.   Patient was originally admitted in 05/2018 after he was found down on his back porch with possible seizure-like activity.  Per EMR, EMS found patient to be in VT at that time that required defibrillation.  Echocardiogram revealed an LVEF of 50-55%.  Diagnostic heart catheterization showed borderline disease in the SVG to OM 3 but otherwise grahams were patent.  Ultimately presentation was felt to be secondary to recent stroke.  In 06/2022 patient reported worsening dyspnea and left-sided chest pain.  Myoview  was ordered and showed an LVEF of 38% with evidence of prior MI.  Subsequent echocardiogram confirmed depressed EF of 35-40% with global hypokinesis.  This lateral R/LHC in 07/2022 which showed severe multivessel native coronary artery disease with occlusion of the SVG ->OM 3 but patent LIMA->LAD, SVG->D2, and SVG -> RPDA.   He presented to [facility] on 05/18/23 via EMS after he was found in the garage sitting on the floor holding his head stating that he was feeling dizzy.  He attempted to stand up but then fell forward and apparently lost consciousness.  His wife said she thought he was turning blue she denies test for pulse but did begin CPR after calling EMS.  She continued CPR and interrupt until EMS arrived and about 5-6 minutes.  EMS continued CPR.  Patient  was reportedly pulseless on EMS arrival and was bleeding from the chest up.  ROSC was achieved after an additional 4 minutes of CPR.  No medications were administered patient was placed on a nonrebreather mask and was taken to [facility] where he required intubation on arrival.  Initial EKG revealed atrial fibrillation with rate around 80 bpm and he was hypotensive.  High-sensitivity troponin mildly elevated at [id number] consistent with demand ischemia.  BNP was 513.6.  WBCs of 18.2, hyperkalemia 5.7 and AKI with creatinine 2.01.  Imaging including head, chest, abdomen, and pelvic CTs that did not show any acute findings other than acute rib fractures from CPR.  He was admitted to ICU and cardiology was consulted for further evaluation.  He was started on IV amiodarone , home CHF medications were held and he was started on IV heparin  and lieu of apixaban .  He was able to be extubated on 05/19/2023.  Echocardiogram showed an LVEF of 40-45% with inferior lateral wall hypokinesis with G1 DD.  He was briefly diuresed with IV Lasix  and also treated for possible aspiration pneumonia.  He underwent a successful DCCV with return of sinus rhythm on 05/22/2023.  EP was consulted was felt that VT cannot be excluded from the cause of cardiac arrest.  Cardiac MRI was performed on 05/24/2023 which showed a moderately reduced LVEF of 38% with lateral wall LV basal-mid subendocardial scar/LGE.  LGE/scar accounting for 12.77 g (11%) of total myocardial mass.  Findings were consistent with prior LV infarct.  He was transferred to [facility] for further EP study.  EP study was completed on 05/26/2023 which showed injectable sustained monomorphic VT.  Given inability to safely pause anticoagulation given his recent cardioversion LifeVest was placed with plans to bring him back for an ICD after 4 weeks of uninterrupted anticoagulation.  He was discharged on p.o. amiodarone  taper, apixaban  5 mg twice daily and Toprol -XL 12.5 mg daily.  No  aspirin  was needed due to full anticoagulation and he was continued on rosuvastatin  and ezetimibe .  He had previously not been started on spironolactone or SGLT2 inhibitor in the past due to renal function.  He was considered stable for discharge and discharged home from the facility on 05/27/2023.  He resolved back to the facility for the implantation of a dual-chamber ICD on 07/03/2023 by Dr. Cindie.  There were no immediate postprocedure complications.  CXR on 07/04/23 demonstrated no pneumothorax status post device implantation.  He was considered stable for discharge on 07/04/2023.  At that time he was advised to be no driving for 6 months from the date and initial visit on May 18, 2023.    He was last seen in clinic 04/10/2024 after an ICD shock by EP, CANDIE Needle, NP.  Device interrogation was completed and reviewed with Dr. Cindie.  Patient was adamant he did not want to restart amiodarone  due to GI side effects.  He was continued on metoprolol  25 mg twice daily.  Was to continue with close follow-up with EP.  He returns to clinic today accompanied with his wife.  States he has been doing well with a cardiac perspective.  Denies any chest pain, palpitations, shortness of breath or issues with recurrent constipation since being off of amiodarone .  States that he was shocked once by his device when she is followed up with EP and was told that he was in atrial fibrillation.  He was started on metoprolol  25 mg twice daily which she has tolerated well.  He brought in a log of the blood pressures that has been keeping at home and blood pressure at home has been running 120 and 130 systolic with heart rates in the 60s and 70s.  States that he has been compliant with his current medication regimen.  Denies any missed doses of his apixaban  and denies any bleeding without recurrent blood noted in his stool or urine.  He had 1 episode of blood noted in his stool was advised that it was hemorrhoids.  Denies any  hospitalizations or visits to the emergency department.  ROS: 10 point review of system has been reviewed and considered negative exception was been listed in the HPI  Studies Reviewed EKG Interpretation Date/Time:  Wednesday May 29 2024 13:25:33 EDT Ventricular Rate:  68 PR Interval:  122 QRS Duration:  120 QT Interval:  392 QTC Calculation: 416 R Axis:   -65  Text Interpretation: Atrial-paced rhythm in a pattern of bigeminy Pulmonary disease pattern Left anterior fascicular block Minimal voltage criteria for LVH, may be normal variant ( Cornell product ) ST & T wave abnormality, consider anterolateral ischemia When compared with ECG of 04-Apr-2024 10:53, No significant change since last tracing Confirmed by Gerard Frederick ([id number]) on 05/29/2024 1:29:29 PM    Echocardiogram 05/19/2023: Impressions: 1. Left ventricular ejection fraction, by estimation, is 40 to 45%. The  left ventricle has mildly decreased function. The left ventricle  demonstrates regional wall motion abnormalities (inferolateral wall  hypokinesis). There is moderate asymmetric left  ventricular hypertrophy of the septal segment. Left ventricular diastolic  parameters are consistent with Grade I  diastolic dysfunction (impaired  relaxation).   2. Right ventricular systolic function is normal. The right ventricular  size is normal. There is normal pulmonary artery systolic pressure. The  estimated right ventricular systolic pressure is 29.0 mmHg.   3. The mitral valve is normal in structure. No evidence of mitral valve  regurgitation. No evidence of mitral stenosis.   4. Tricuspid valve regurgitation is moderate.   5. The aortic valve is normal in structure. Aortic valve regurgitation is  not visualized. No aortic stenosis is present.   6. There is borderline dilatation of the aortic root, measuring 40 mm.   7. The inferior vena cava is normal in size with greater than 50%  respiratory variability, suggesting right  atrial pressure of 3 mmHg.    Cardiac MRI 05/23/2023: Impressions: 1.  Moderately reduced LV systolic function.  LVEF = 38% 2.  Lateral wall LV Basal-mid subendocardial scar/LGE. 3.  LGE/scar accounts for 12.77g (11%) of total myocardial mass. 4.  Normal RV function. 5.  Findings consistent with prior LV infarct. 6.  Ischemic cardiomyopathy.   EP Study 05/26/2023: Conclusions: 1. Sinus rhythm upon presentation.  2. Sustained, monomorphic VT inducible at EP study 3. AV conduction appears nodal. 4. No early apparent complications.  5. Start Amiodarone  6. Plan for outpatient ICD implant. Will bridge with a LifeVest until he can return for ICD implant. Risk Assessment/Calculations  CHA2DS2-VASc Score = 4   This indicates a 4.8% annual risk of stroke. The patient's score is based upon: CHF History: 1 HTN History: 1 Diabetes History: 0 Stroke History: 0 Vascular Disease History: 1 Age Score: 1 Gender Score: 0            Physical Exam VS:  BP (!) 90/58 (BP Location: Left Arm, Patient Position: Sitting, Cuff Size: Normal)   Pulse 68   Ht 5' 11 (1.803 m)   Wt 187 lb (84.8 kg)   SpO2 96%   BMI 26.08 kg/m        Wt Readings from Last 3 Encounters:  05/29/24 187 lb (84.8 kg)  05/08/24 185 lb 12.8 oz (84.3 kg)  04/10/24 186 lb 6.4 oz (84.6 kg)    GEN: Well nourished, well developed in no acute distress NECK: No JVD; No carotid bruits CARDIAC: RRR, no murmurs, rubs, gallops RESPIRATORY:  Clear to auscultation without rales, wheezing or rhonchi  ABDOMEN: Soft, non-tender, non-distended EXTREMITIES:  No edema; No deformity   ASSESSMENT AND PLAN Cardiac arrest with monomorphic VT with bystander CPR by his wife status post ICD  Findings consistent with prior LV infarct. shortness of EF 30% with lateral wall inferobasal/mid subendocardial scar/LGE.SABRACardiac, he had inducible sustained monomorphic ventricular tachycardia with repeat studies and his ICD was placed.  Amiodarone  had  to be discontinued due to GI upset and side effects and he is continued on Toprol  25 mg twice daily.    Coronary artery disease status post remote CABG in 2023.  Continues to remain chest pain-free and without anginal equivalents.  He is continued on apixaban  5 mg twice daily and lieu of aspirin  and rosuvastatin  and ezetimibe .  EKG today reveals a paced with a left anterior fascicular block, LVH, ST-T wave abnormality with no significant change from prior studies.  No further ischemic workup at this time.  Paroxysmal atrial fibrillation with EKG today revealing a paced rhythm.  He is continued on metoprolol  25 mg twice daily and apixaban  5 mg twice daily for CHA2DS2-VASc score of at least 4 for stroke prophylaxis.  Continues to tolerate his medication well without any undue side effects.  Chronic HFrEF/ischemic cardiomyopathy with last echocardiogram revealed an LVEF of 40-45% with inferior wall hypokinesis.  He denies any shortness of breath continues to be euvolemic on exam.  He has continued on Toprol -XL 25 mg twice daily, Entresto  25/26 mg twice daily but is not on MRA or SGLT2 inhibitor due to prior renal function.  He is encouraged to continue to weigh himself daily and monitor his fluid intake.  Mixed hyperlipidemia where he continues to be on rosuvastatin  40 mg daily and ezetimibe  10 mg daily last LDL was 66 which remains at goal of less than 70 .  CKD stage III with last serum creatinine done 05/08/2024 1.71 from slightly higher than his baseline of 1.5.  Encouraged to maintain adequate hydration and continue to avoid NSAIDs nephrotoxic agents.  He was recommended to continue to follow-up with nephrology.       Dispo: Patient to return to clinic to see MD/APP in 6 months or sooner if needed for further evaluation.  Signed, Shia Delaine, NP

## 2024-05-29 NOTE — Patient Instructions (Signed)
 Medication Instructions:  Your physician recommends that you continue on your current medications as directed. Please refer to the Current Medication list given to you today.   *If you need a refill on your cardiac medications before your next appointment, please call your pharmacy*  Lab Work: No labs ordered today  If you have labs (blood work) drawn today and your tests are completely normal, you will receive your results only by: MyChart Message (if you have MyChart) OR A paper copy in the mail If you have any lab test that is abnormal or we need to change your treatment, we will call you to review the results.  Testing/Procedures: No test ordered today   Follow-Up: At [facility], you and your health needs are our priority.  As part of our continuing mission to provide you with exceptional heart care, our providers are all part of one team.  This team includes your primary Cardiologist (physician) and Advanced Practice Providers or APPs (Physician Assistants and Nurse Practitioners) who all work together to provide you with the care you need, when you need it.  Your next appointment:   6 month(s)  Provider:   Antionette Kirks, MD or Ronald Cockayne, NP

## 2024-06-04 ENCOUNTER — Encounter: Payer: Self-pay | Admitting: Nurse Practitioner

## 2024-06-04 NOTE — Telephone Encounter (Signed)
 Needs appt or to wait until Stuart Harvey's return

## 2024-06-10 ENCOUNTER — Telehealth: Payer: Self-pay

## 2024-06-10 NOTE — Telephone Encounter (Signed)
 Copied from CRM #[id number]. Topic: Appointments - Appointment Scheduling >> Jun 10, 2024  1:01 PM Rosaria E wrote: Patient/patient representative is calling to schedule an appointment. Refer to attachments for appointment information.   Pt called following up on mychart message with PCP, no appt available until September. Please advise if pt can be worked in sooner.

## 2024-06-29 NOTE — Patient Instructions (Incomplete)
 Start reducing off Effexor  by taking every other day for one week (starting tomorrow, do not take a dose) and then take two times weekly the following week, then stop.  Bring Celexa  in mid next week (Wednesday, 07/10/24). If any questions reach out to me .  Be Involved in Caring For Your Health:  Taking Medications When medications are taken as directed, they can greatly improve your health. But if they are not taken as prescribed, they may not work. In some cases, not taking them correctly can be harmful. To help ensure your treatment remains effective and safe, understand your medications and how to take them. Bring your medications to each visit for review by your provider.  Your lab results, notes, and after visit summary will be available on My Chart. We strongly encourage you to use this feature. If lab results are abnormal the clinic will contact you with the appropriate steps. If the clinic does not contact you assume the results are satisfactory. You can always view your results on My Chart. If you have questions regarding your health or results, please contact the clinic during office hours. You can also ask questions on My Chart.  We at [facility] are grateful that you chose us  to provide your care. We strive to provide evidence-based and compassionate care and are always looking for feedback. If you get a survey from the clinic please complete this so we can hear your opinions.  Insomnia Insomnia is a sleep disorder that makes it difficult to fall asleep or stay asleep. Insomnia can cause fatigue, low energy, difficulty concentrating, mood swings, and poor performance at work or school. There are three different ways to classify insomnia: Difficulty falling asleep. Difficulty staying asleep. Waking up too early in the morning. Any type of insomnia can be long-term (chronic) or short-term (acute). Both are common. Short-term insomnia usually lasts for 3 months or less.  Chronic insomnia occurs at least three times a week for longer than 3 months. What are the causes? Insomnia may be caused by another condition, situation, or substance, such as: Having certain mental health conditions, such as anxiety and depression. Using caffeine, alcohol, tobacco, or drugs. Having gastrointestinal conditions, such as gastroesophageal reflux disease (GERD). Having certain medical conditions. These include: Asthma. Alzheimer's disease. Stroke. Chronic pain. An overactive thyroid  gland (hyperthyroidism). Other sleep disorders, such as restless legs syndrome and sleep apnea. Menopause. Sometimes, the cause of insomnia may not be known. What increases the risk? Risk factors for insomnia include: Gender. Females are affected more often than males. Age. Insomnia is more common as people get older. Stress and certain medical and mental health conditions. Lack of exercise. Having an irregular work schedule. This may include working night shifts and traveling between different time zones. What are the signs or symptoms? If you have insomnia, the main symptom is having trouble falling asleep or having trouble staying asleep. This may lead to other symptoms, such as: Feeling tired or having low energy. Feeling nervous about going to sleep. Not feeling rested in the morning. Having trouble concentrating. Feeling irritable, anxious, or depressed. How is this diagnosed? This condition may be diagnosed based on: Your symptoms and medical history. Your health care provider may ask about: Your sleep habits. Any medical conditions you have. Your mental health. A physical exam. How is this treated? Treatment for insomnia depends on the cause. Treatment may focus on treating an underlying condition that is causing the insomnia. Treatment may also include: Medicines to help  you sleep. Counseling or therapy. Lifestyle adjustments to help you sleep better. Follow these  instructions at home: Eating and drinking  Limit or avoid alcohol, caffeinated beverages, and products that contain nicotine  and tobacco, especially close to bedtime. These can disrupt your sleep. Do not eat a large meal or eat spicy foods right before bedtime. This can lead to digestive discomfort that can make it hard for you to sleep. Sleep habits  Keep a sleep diary to help you and your health care provider figure out what could be causing your insomnia. Write down: When you sleep. When you wake up during the night. How well you sleep and how rested you feel the next day. Any side effects of medicines you are taking. What you eat and drink. Make your bedroom a dark, comfortable place where it is easy to fall asleep. Put up shades or blackout curtains to block light from outside. Use a white noise machine to block noise. Keep the temperature cool. Limit screen use before bedtime. This includes: Not watching TV. Not using your smartphone, tablet, or computer. Stick to a routine that includes going to bed and waking up at the same times every day and night. This can help you fall asleep faster. Consider making a quiet activity, such as reading, part of your nighttime routine. Try to avoid taking naps during the day so that you sleep better at night. Get out of bed if you are still awake after 15 minutes of trying to sleep. Keep the lights down, but try reading or doing a quiet activity. When you feel sleepy, go back to bed. General instructions Take over-the-counter and prescription medicines only as told by your health care provider. Exercise regularly as told by your health care provider. However, avoid exercising in the hours right before bedtime. Use relaxation techniques to manage stress. Ask your health care provider to suggest some techniques that may work well for you. These may include: Breathing exercises. Routines to release muscle tension. Visualizing peaceful scenes. Make  sure that you drive carefully. Do not drive if you feel very sleepy. Keep all follow-up visits. This is important. Contact a health care provider if: You are tired throughout the day. You have trouble in your daily routine due to sleepiness. You continue to have sleep problems, or your sleep problems get worse. Get help right away if: You have thoughts about hurting yourself or someone else. Get help right away if you feel like you may hurt yourself or others, or have thoughts about taking your own life. Go to your nearest emergency room or: Call 911. Call the National Suicide Prevention Lifeline at (302)248-6343 or 988. This is open 24 hours a day. Text the Crisis Text Line at (810) 440-5545. Summary Insomnia is a sleep disorder that makes it difficult to fall asleep or stay asleep. Insomnia can be long-term (chronic) or short-term (acute). Treatment for insomnia depends on the cause. Treatment may focus on treating an underlying condition that is causing the insomnia. Keep a sleep diary to help you and your health care provider figure out what could be causing your insomnia. This information is not intended to replace advice given to you by your health care provider. Make sure you discuss any questions you have with your health care provider. Document Revised: 10/11/2021 Document Reviewed: 10/11/2021 Elsevier Patient Education  2024 [employer].

## 2024-07-02 ENCOUNTER — Ambulatory Visit (INDEPENDENT_AMBULATORY_CARE_PROVIDER_SITE_OTHER): Payer: Medicare HMO

## 2024-07-02 DIAGNOSIS — I255 Ischemic cardiomyopathy: Secondary | ICD-10-CM | POA: Diagnosis not present

## 2024-07-03 ENCOUNTER — Ambulatory Visit: Admitting: Nurse Practitioner

## 2024-07-03 ENCOUNTER — Encounter: Payer: Self-pay | Admitting: Nurse Practitioner

## 2024-07-03 VITALS — BP 94/57 | HR 82 | Temp 98.4°F | Ht 71.0 in | Wt 187.4 lb

## 2024-07-03 DIAGNOSIS — F5104 Psychophysiologic insomnia: Secondary | ICD-10-CM

## 2024-07-03 DIAGNOSIS — F419 Anxiety disorder, unspecified: Secondary | ICD-10-CM

## 2024-07-03 DIAGNOSIS — F324 Major depressive disorder, single episode, in partial remission: Secondary | ICD-10-CM

## 2024-07-03 LAB — CUP PACEART REMOTE DEVICE CHECK
Battery Remaining Longevity: 141 mo
Battery Voltage: 3.02 V
Brady Statistic AP VP Percent: 0.01 %
Brady Statistic AP VS Percent: 24.07 %
Brady Statistic AS VP Percent: 0.04 %
Brady Statistic AS VS Percent: 75.88 %
Brady Statistic RA Percent Paced: 23.78 %
Brady Statistic RV Percent Paced: 0.05 %
Date Time Interrogation Session: 20250819001956
HighPow Impedance: 71 Ohm
Implantable Lead Connection Status: 753985
Implantable Lead Connection Status: 753985
Implantable Lead Implant Date: 20240819
Implantable Lead Implant Date: 20240819
Implantable Lead Location: 753859
Implantable Lead Location: 753860
Implantable Lead Model: 5076
Implantable Pulse Generator Implant Date: 20240819
Lead Channel Impedance Value: 380 Ohm
Lead Channel Impedance Value: 456 Ohm
Lead Channel Impedance Value: 494 Ohm
Lead Channel Pacing Threshold Amplitude: 0.625 V
Lead Channel Pacing Threshold Amplitude: 0.75 V
Lead Channel Pacing Threshold Pulse Width: 0.4 ms
Lead Channel Pacing Threshold Pulse Width: 0.4 ms
Lead Channel Sensing Intrinsic Amplitude: 12.3 mV
Lead Channel Sensing Intrinsic Amplitude: 2.4 mV
Lead Channel Setting Pacing Amplitude: 1.5 V
Lead Channel Setting Pacing Amplitude: 2 V
Lead Channel Setting Pacing Pulse Width: 0.4 ms
Lead Channel Setting Sensing Sensitivity: 0.3 mV
Zone Setting Status: 755011
Zone Setting Status: 755011

## 2024-07-03 MED ORDER — BELSOMRA 20 MG PO TABS: 20.0000 mg | ORAL_TABLET | Freq: Every evening | ORAL | 4 refills | Status: AC | PRN

## 2024-07-03 MED ORDER — CITALOPRAM HYDROBROMIDE 20 MG PO TABS: 20.0000 mg | ORAL_TABLET | Freq: Every day | ORAL | 3 refills | Status: AC

## 2024-07-03 MED ORDER — BUPROPION HCL ER (SR) 200 MG PO TB12: 200.0000 mg | ORAL_TABLET | Freq: Two times a day (BID) | ORAL | 2 refills | Status: AC

## 2024-07-03 NOTE — Assessment & Plan Note (Signed)
 Chronic, stable.  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  Continue Wellbutrin  as ordered (max dose which he has been on for years) and Buspar  20 MG TID (max dose, with goal to reduce in future).  Denies SI/HI.  Monitor Wellbutrin  and Toprol  XL use -- discussed may need to consider mood medication adjustments in future.  Would benefit therapy in future.   - No longer on Amiodarone , will trial reducing off Effexor , is at low dose, and bringing back in Celexa  which he had been on for years in past and offered a lot of benefit.  Wrote down instructions on how to perform this change. Will monitor Celexa  use with Belsomra  closely, goal is to stop Belsomra  in future once anxiety more controlled and sleep improved. - Goals in long run is to reduce off Buspar  and Belsomra , but continue Wellbutrin  and Celexa  which he took for years in past with benefit.

## 2024-07-03 NOTE — Assessment & Plan Note (Signed)
 Ongoing. Tried Melatonin without benefit.  Using good sleep techniques, difficulty getting to sleep. Tolerating Belsomra , tried other medications without benefit.  Will increase to 20 MG nightly, which is max dosing and monitor closely.  Plan to use for shortest period possible, with goal to transition off once anxiety improved.  Would benefit therapy time which may help sleep pattern or sleep study, refuses at this time.

## 2024-07-03 NOTE — Progress Notes (Signed)
 BP (!) 94/57   Pulse 82   Temp 98.4 F (36.9 C) (Oral)   Ht 5' 11 (1.803 m)   Wt 187 lb 6.4 oz (85 kg)   SpO2 97%   BMI 26.14 kg/m    Subjective:    Patient ID: Stuart Harvey, male    DOB: 1951-04-23, 73 y.o.   MRN: [id number]  HPI: Stuart Harvey is a 73 y.o. male  Chief Complaint  Patient presents with   Insomnia    INSOMNIA Continues to struggle with sleep.  Currently taking Belsomra .  Currently taking Effexor  and Wellbutrin  for mood due to having been on Amiodarone  and having to stop Celexa  which he had been on for years with the Wellbutrin , however is no longer on Amiodarone  due to GI side effects.  Goes to bed around 12 am to 1 am, baseline for him, then goes to sleep around 3-4 am.  Then will sleep until 10 am.  Prior to cardiac arrest he went to bed at 12-1 am, but went to sleep then too.  Medications tried: Lunesta , Trazodone , Buspar , Seroquel  (in 2019). Duration: chronic Satisfied with sleep quality: no Difficulty falling asleep: yes Difficulty staying asleep: no Waking a few hours after sleep onset: no Early morning awakenings: no Daytime hypersomnolence: no Wakes feeling refreshed: no Good sleep hygiene: yes Apnea: no Snoring: no Depressed/anxious mood: yes Recent stress: yes Restless legs/nocturnal leg cramps: no Chronic pain/arthritis: no History of sleep study: no Treatments attempted: as above      05/08/2024    2:39 PM 02/01/2024   11:30 AM 11/14/2023   11:19 AM 10/24/2023    3:08 PM 09/26/2023    3:59 PM  Depression screen PHQ 2/9  Decreased Interest 0 1 1 2  0  Down, Depressed, Hopeless 1 1 1 2 1   PHQ - 2 Score 1 2 2 4 1   Altered sleeping 3 3 1 2 2   Tired, decreased energy 2 1 1 2 2   Change in appetite 1 1 0 1 1  Feeling bad or failure about yourself  0 0 0 0 0  Trouble concentrating 2 1 1 2 1   Moving slowly or fidgety/restless 0 0 0 1 1  Suicidal thoughts 0 0 0 0 0  PHQ-9 Score 9 8 5 12 8   Difficult doing work/chores Somewhat  difficult  Not difficult at all Somewhat difficult Somewhat difficult       05/08/2024    2:40 PM 02/01/2024   11:31 AM 10/24/2023    3:08 PM 09/26/2023    4:00 PM  GAD 7 : Generalized Anxiety Score  Nervous, Anxious, on Edge 1 1 2 1   Control/stop worrying 1 1 1 1   Worry too much - different things 1 2 1 1   Trouble relaxing 1 1 1 1   Restless 0 1 1 1   Easily annoyed or irritable 0 1 1 1   Afraid - awful might happen 1 1 1 2   Total GAD 7 Score 5 8 8 8   Anxiety Difficulty Somewhat difficult Somewhat difficult Somewhat difficult Somewhat difficult      Relevant past medical, surgical, family and social history reviewed and updated as indicated. Interim medical history since our last visit reviewed. Allergies and medications reviewed and updated.  Review of Systems  Constitutional:  Negative for activity change, diaphoresis, fatigue and fever.  Respiratory:  Negative for cough, chest tightness, shortness of breath and wheezing.   Cardiovascular:  Negative for chest pain, palpitations and leg swelling.  Gastrointestinal:  Negative.   Neurological: Negative.   Psychiatric/Behavioral:  Positive for sleep disturbance. Negative for decreased concentration, self-injury and suicidal ideas. The patient is nervous/anxious.     Per HPI unless specifically indicated above     Objective:    BP (!) 94/57   Pulse 82   Temp 98.4 F (36.9 C) (Oral)   Ht 5' 11 (1.803 m)   Wt 187 lb 6.4 oz (85 kg)   SpO2 97%   BMI 26.14 kg/m   Wt Readings from Last 3 Encounters:  07/03/24 187 lb 6.4 oz (85 kg)  05/29/24 187 lb (84.8 kg)  05/08/24 185 lb 12.8 oz (84.3 kg)    Physical Exam Vitals and nursing note reviewed.  Constitutional:      General: He is awake. He is not in acute distress.    Appearance: Normal appearance. He is well-developed and well-groomed. He is not ill-appearing or toxic-appearing.  HENT:     Head: Normocephalic.     Right Ear: Hearing and external ear normal.     Left Ear:  Hearing and external ear normal.  Eyes:     General: Lids are normal.     Extraocular Movements: Extraocular movements intact.     Conjunctiva/sclera: Conjunctivae normal.  Neck:     Thyroid : No thyromegaly.     Vascular: No carotid bruit.  Cardiovascular:     Rate and Rhythm: Normal rate and regular rhythm.     Heart sounds: Normal heart sounds. No murmur heard.    No gallop.  Pulmonary:     Effort: No accessory muscle usage or respiratory distress.     Breath sounds: Normal breath sounds.  Abdominal:     General: Bowel sounds are normal. There is no distension.     Palpations: Abdomen is soft. There is no hepatomegaly or mass.     Tenderness: There is no abdominal tenderness. There is no right CVA tenderness or left CVA tenderness.  Musculoskeletal:     Cervical back: Full passive range of motion without pain.     Right lower leg: No edema.     Left lower leg: No edema.  Lymphadenopathy:     Cervical: No cervical adenopathy.  Skin:    General: Skin is warm.     Capillary Refill: Capillary refill takes less than 2 seconds.  Neurological:     Mental Status: He is alert and oriented to person, place, and time.     Deep Tendon Reflexes: Reflexes are normal and symmetric.     Reflex Scores:      Brachioradialis reflexes are 2+ on the right side and 2+ on the left side.      Patellar reflexes are 2+ on the right side and 2+ on the left side. Psychiatric:        Attention and Perception: Attention normal.        Mood and Affect: Mood normal.        Speech: Speech normal.        Behavior: Behavior normal. Behavior is cooperative.        Thought Content: Thought content normal.     Results for orders placed or performed in visit on 05/08/24  Microalbumin, Urine Waived   Collection Time: 05/08/24  2:48 PM  Result Value Ref Range   Microalb, Ur Waived 30 (H) 0 - 19 mg/L   Creatinine, Urine Waived 200 10 - 300 mg/dL   Microalb/Creat Ratio <30 <30 mg/g  CBC with  Differential/Platelet   Collection  Time: 05/08/24  2:50 PM  Result Value Ref Range   WBC 10.5 3.4 - 10.8 x10E3/uL   RBC 4.73 4.14 - 5.80 x10E6/uL   Hemoglobin 13.5 13.0 - 17.7 g/dL   Hematocrit 57.0 62.4 - 51.0 %   MCV 91 79 - 97 fL   MCH 28.5 26.6 - 33.0 pg   MCHC 31.5 31.5 - 35.7 g/dL   RDW 86.7 88.3 - 84.5 %   Platelets 182 150 - 450 x10E3/uL   Neutrophils 45 Not Estab. %   Lymphs 43 Not Estab. %   Monocytes 7 Not Estab. %   Eos 4 Not Estab. %   Basos 1 Not Estab. %   Neutrophils Absolute 4.8 1.4 - 7.0 x10E3/uL   Lymphocytes Absolute 4.5 (H) 0.7 - 3.1 x10E3/uL   Monocytes Absolute 0.7 0.1 - 0.9 x10E3/uL   EOS (ABSOLUTE) 0.4 0.0 - 0.4 x10E3/uL   Basophils Absolute 0.1 0.0 - 0.2 x10E3/uL   Immature Granulocytes 0 Not Estab. %   Immature Grans (Abs) 0.0 0.0 - 0.1 x10E3/uL  Comprehensive metabolic panel with GFR   Collection Time: 05/08/24  2:50 PM  Result Value Ref Range   Glucose 96 70 - 99 mg/dL   BUN 29 (H) 8 - 27 mg/dL   Creatinine, Ser 8.28 (H) 0.76 - 1.27 mg/dL   eGFR 42 (L) >40 fO/fpw/8.26   BUN/Creatinine Ratio 17 10 - 24   Sodium 142 134 - 144 mmol/L   Potassium 4.9 3.5 - 5.2 mmol/L   Chloride 108 (H) 96 - 106 mmol/L   CO2 18 (L) 20 - 29 mmol/L   Calcium  9.7 8.6 - 10.2 mg/dL   Total Protein 6.5 6.0 - 8.5 g/dL   Albumin 4.2 3.8 - 4.8 g/dL   Globulin, Total 2.3 1.5 - 4.5 g/dL   Bilirubin Total 0.5 0.0 - 1.2 mg/dL   Alkaline Phosphatase 111 44 - 121 IU/L   AST 25 0 - 40 IU/L   ALT 12 0 - 44 IU/L  Lipid Panel w/o Chol/HDL Ratio   Collection Time: 05/08/24  2:50 PM  Result Value Ref Range   Cholesterol, Total 135 100 - 199 mg/dL   Triglycerides 879 0 - 149 mg/dL   HDL 48 >60 mg/dL   VLDL Cholesterol Cal 21 5 - 40 mg/dL   LDL Chol Calc (NIH) 66 0 - 99 mg/dL  TSH   Collection Time: 05/08/24  2:50 PM  Result Value Ref Range   TSH 0.666 0.450 - 4.500 uIU/mL  T4, free   Collection Time: 05/08/24  2:50 PM  Result Value Ref Range   Free T4 1.77 0.82 - 1.77  ng/dL  HgB J8r   Collection Time: 05/08/24  2:50 PM  Result Value Ref Range   Hgb A1c MFr Bld 6.1 (H) 4.8 - 5.6 %   Est. average glucose Bld gHb Est-mCnc 128 mg/dL      Assessment & Plan:   Problem List Items Addressed This Visit       Other   Insomnia   Ongoing. Tried Melatonin without benefit.  Using good sleep techniques, difficulty getting to sleep. Tolerating Belsomra , tried other medications without benefit.  Will increase to 20 MG nightly, which is max dosing and monitor closely.  Plan to use for shortest period possible, with goal to transition off once anxiety improved.  Would benefit therapy time which may help sleep pattern or sleep study, refuses at this time.      Depression - Primary  Chronic, stable.  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  Continue Wellbutrin  as ordered (max dose which he has been on for years) and Buspar  20 MG TID (max dose, with goal to reduce in future).  Denies SI/HI.  Monitor Wellbutrin  and Toprol  XL use -- discussed may need to consider mood medication adjustments in future.  Would benefit therapy in future.   - No longer on Amiodarone , will trial reducing off Effexor , is at low dose, and bringing back in Celexa  which he had been on for years in past and offered a lot of benefit.  Wrote down instructions on how to perform this change. Will monitor Celexa  use with Belsomra  closely, goal is to stop Belsomra  in future once anxiety more controlled and sleep improved. - Goals in long run is to reduce off Buspar  and Belsomra , but continue Wellbutrin  and Celexa  which he took for years in past with benefit.      Relevant Medications   buPROPion  (WELLBUTRIN  SR) 200 MG 12 hr tablet   citalopram  (CELEXA ) 20 MG tablet   Anxiety   Refer to depression plan of care.      Relevant Medications   buPROPion  (WELLBUTRIN  SR) 200 MG 12 hr tablet   citalopram  (CELEXA ) 20 MG tablet    Time: 25 minutes, >50% spent  counseling/or care coordination   Follow up plan: Return in about 6 weeks (around 08/14/2024) for ANXIETY, Depression, Insomnia.

## 2024-07-03 NOTE — Assessment & Plan Note (Signed)
 Refer to depression plan of care.

## 2024-07-05 ENCOUNTER — Ambulatory Visit: Payer: Self-pay | Admitting: Cardiology

## 2024-07-10 NOTE — Progress Notes (Unsigned)
 Electrophysiology Clinic Note    Date:  07/11/2024  Patient ID:  Stuart Harvey, Stuart Harvey May 03, 1951, MRN [id number] PCP:  Valerio Melanie DASEN, NP  Cardiologist:  Deatrice Cage, MD   Cardiology APP:  Gerard Frederick, NP  Electrophysiologist:  OLE DASEN HOLTS, MD  Electrophysiology APP:  Maylea Soria, NP     Discussed the use of AI scribe software for clinical note transcription with the patient, who gave verbal consent to proceed.   Patient Profile    Chief Complaint: ICD, AFib/flutter follow-up  History of Present Illness: Stuart Harvey is a 73 y.o. male with PMH notable for CAD s/p CABG (2008), HFrEF, ICM, recurrent syncope with inducible VT s/p ICD, parox AFib, HTN ; seen today for OLE DASEN HOLTS, MD for routine electrophysiology followup.  He had unexplained syncope 05/2023 where wife performed CPR. He was in afib throughout the hospitalization so unclear whether vagally-mediated iso of aFib or arrhythmogenic cause.  Given his CAD history, EPS was recommended with inducible VT. He had ICD implanted 06/2023.   I last saw him 03/2024 after HV therapy for atrial arrhythmia. He was tolerating toprol .   On follow-up today, he is overall doing well. He denies episodes of palpitations, syncope or presyncope. He does have intermittent dizziness with position changes, but these are brief and resolve quickly. He checks BP and home readings are 90-95 systolic. He denies edema. He continues to work with PCP for his GI symptoms.  His wife joins for appt whom I have met before.    Arrhythmia/Device History MDT dual chamber ICD, imp 06/2023; dx VT - inappropriate shock for SVT   Amiodarone  -  Started 7/24 after VT stopped 02/2024 d/t constipation, GI upset    ROS:  Please see the history of present illness. All other systems are reviewed and otherwise negative.    Physical Exam    VS:  BP (!) 86/60 (BP Location: Left Arm, Patient Position: Sitting, Cuff Size: Normal)   Pulse  66   Ht 5' 11 (1.803 m)   Wt 186 lb 9.6 oz (84.6 kg)   SpO2 96%   BMI 26.03 kg/m  BMI: Body mass index is 26.03 kg/m.      Wt Readings from Last 3 Encounters:  07/11/24 186 lb 9.6 oz (84.6 kg)  07/03/24 187 lb 6.4 oz (85 kg)  05/29/24 187 lb (84.8 kg)     GEN- The patient is well appearing, alert and oriented x 3 today.   Lungs- Clear to ausculation bilaterally, normal work of breathing.  Heart- Irregularly irregular rate and rhythm, no murmurs, rubs or gallops Extremities- No peripheral edema, warm, dry Skin-  device pocket well-healed, no tethering   Device interrogation done today and reviewed by myself:  Battery good Lead thresholds, impedence, sensing not tested One Aflutter episode, appropriate ATP x 1 with dirty break No VT episodes No changes made today   Studies Reviewed   Previous EP, cardiology notes.    EKG is not ordered. Personal review of EKG from 05/29/2024 shows:  Intermittent AP, LAFB       Cardiac MRI, 05/23/2023 1.  Moderately reduced LV systolic function.  LVEF = 38%  2.  Lateral wall LV Basal-mid subendocardial scar/LGE.  3.  LGE/scar accounts for 12.77g (11%) of total myocardial mass.  4.  Normal RV function.  5.  Findings consistent with prior LV infarct.  6.  Ischemic cardiomyopathy.   TTE, 05/19/2023 1. Left ventricular ejection fraction, by estimation, is  40 to 45%. The left ventricle has mildly decreased function. The left ventricle demonstrates regional wall motion abnormalities (inferolateral wall hypokinesis). There is moderate asymmetric left ventricular hypertrophy of the septal segment. Left ventricular diastolic parameters are consistent with Grade I diastolic dysfunction (impaired relaxation).   2. Right ventricular systolic function is normal. The right ventricular size is normal. There is normal pulmonary artery systolic pressure. The estimated right ventricular systolic pressure is 29.0 mmHg.   3. The mitral valve is normal in  structure. No evidence of mitral valve regurgitation. No evidence of mitral stenosis.   4. Tricuspid valve regurgitation is moderate.   5. The aortic valve is normal in structure. Aortic valve regurgitation is not visualized. No aortic stenosis is present.   6. There is borderline dilatation of the aortic root, measuring 40 mm.   7. The inferior vena cava is normal in size with greater than 50% respiratory variability, suggesting right atrial pressure of 3 mmHg.    Assessment and Plan    #) parox afib #) aflutter Rare episodes  Continue 25mg  toprol  BID  #) inducible VT s/p ICD  VT quiescent ICD functioning well, see paceart for details  #) Hypercoag d/t afib CHA2DS2-VASc Score = at least 4 [CHF History: 1, HTN History: 1, Diabetes History: 0, Stroke History: 0, Vascular Disease History: 1, Age Score: 1, Gender Score: 0].  Therefore, the patient's annual risk of stroke is 4.8 %.    Stroke ppx - 5mg  eliquis  BID, appropriately dosed No bleeding concerns  #) HFrEF #) dizziness #) hypotension Borderline BP in office today and by home readings Patient is having rare dizziness, no syncope or presyncope Continue 24-26 entresto , toprol  as above Consider stopping entresto  if symptoms worsen       Current medicines are reviewed at length with the patient today.   The patient does not have concerns regarding his medicines.  The following changes were made today:  none  Labs/ tests ordered today include:  No orders of the defined types were placed in this encounter.    Disposition: Follow up with Dr. Cindie or EP APP in 12 months, continue remote monitoring   Signed, Chantal Needle, NP  07/11/24  12:24 PM  Electrophysiology [facility]

## 2024-07-11 ENCOUNTER — Ambulatory Visit: Attending: Cardiology | Admitting: Cardiology

## 2024-07-11 VITALS — BP 86/60 | HR 66 | Ht 71.0 in | Wt 186.6 lb

## 2024-07-11 DIAGNOSIS — I472 Ventricular tachycardia, unspecified: Secondary | ICD-10-CM

## 2024-07-11 DIAGNOSIS — I480 Paroxysmal atrial fibrillation: Secondary | ICD-10-CM

## 2024-07-11 DIAGNOSIS — D6869 Other thrombophilia: Secondary | ICD-10-CM

## 2024-07-11 DIAGNOSIS — Z95810 Presence of automatic (implantable) cardiac defibrillator: Secondary | ICD-10-CM | POA: Diagnosis not present

## 2024-07-11 NOTE — Patient Instructions (Signed)
 Medication Instructions:  Your physician recommends that you continue on your current medications as directed. Please refer to the Current Medication list given to you today.   *If you need a refill on your cardiac medications before your next appointment, please call your pharmacy*  Lab Work: No labs ordered today  If you have labs (blood work) drawn today and your tests are completely normal, you will receive your results only by: MyChart Message (if you have MyChart) OR A paper copy in the mail If you have any lab test that is abnormal or we need to change your treatment, we will call you to review the results.  Testing/Procedures: No test ordered today   Follow-Up: At [facility], you and your health needs are our priority.  As part of our continuing mission to provide you with exceptional heart care, our providers are all part of one team.  This team includes your primary Cardiologist (physician) and Advanced Practice Providers or APPs (Physician Assistants and Nurse Practitioners) who all work together to provide you with the care you need, when you need it.  Your next appointment:   1 year(s)  Provider:   You may see Dr Kennyth or Suzann Riddle, NP   We recommend signing up for the patient portal called MyChart.  Sign up information is provided on this After Visit Summary.  MyChart is used to connect with patients for Virtual Visits (Telemedicine).  Patients are able to view lab/test results, encounter notes, upcoming appointments, etc.  Non-urgent messages can be sent to your provider as well.   To learn more about what you can do with MyChart, go to [email or web address].

## 2024-07-17 ENCOUNTER — Ambulatory Visit: Admitting: Nurse Practitioner

## 2024-07-18 ENCOUNTER — Other Ambulatory Visit (INDEPENDENT_AMBULATORY_CARE_PROVIDER_SITE_OTHER): Payer: Self-pay | Admitting: Vascular Surgery

## 2024-07-18 DIAGNOSIS — I7143 Infrarenal abdominal aortic aneurysm, without rupture: Secondary | ICD-10-CM

## 2024-07-19 ENCOUNTER — Ambulatory Visit (INDEPENDENT_AMBULATORY_CARE_PROVIDER_SITE_OTHER): Payer: Medicare HMO | Admitting: Vascular Surgery

## 2024-07-19 ENCOUNTER — Other Ambulatory Visit (INDEPENDENT_AMBULATORY_CARE_PROVIDER_SITE_OTHER): Payer: Medicare HMO

## 2024-07-19 VITALS — BP 99/63 | HR 73 | Ht 71.0 in | Wt 188.4 lb

## 2024-07-19 DIAGNOSIS — I7143 Infrarenal abdominal aortic aneurysm, without rupture: Secondary | ICD-10-CM

## 2024-07-19 DIAGNOSIS — I83812 Varicose veins of left lower extremities with pain: Secondary | ICD-10-CM

## 2024-07-19 DIAGNOSIS — I129 Hypertensive chronic kidney disease with stage 1 through stage 4 chronic kidney disease, or unspecified chronic kidney disease: Secondary | ICD-10-CM | POA: Diagnosis not present

## 2024-07-19 DIAGNOSIS — N183 Chronic kidney disease, stage 3 unspecified: Secondary | ICD-10-CM

## 2024-07-19 DIAGNOSIS — N1831 Chronic kidney disease, stage 3a: Secondary | ICD-10-CM

## 2024-07-19 DIAGNOSIS — E782 Mixed hyperlipidemia: Secondary | ICD-10-CM | POA: Diagnosis not present

## 2024-07-19 NOTE — Assessment & Plan Note (Signed)
 His duplex today demonstrates a patent stent graft without endoleak.  The maximal aortic diameter is about 3.1 cm which is unchanged from his study last year.  Continue to follow on an annual basis.  No change in medications.

## 2024-07-19 NOTE — Assessment & Plan Note (Signed)
 We discussed the role of compression, elevation, and exercise to try to reduce the pain and swelling from venous insufficiency.  At this point, he does not want to pursue further evaluation or invasive options but if his symptoms worsen he will contact our office.

## 2024-07-19 NOTE — Progress Notes (Signed)
 MRN : [id number]  Stuart Harvey is a 73 y.o. (1951-10-01) male who presents with chief complaint of No chief complaint on file. Stuart Harvey  History of Present Illness: Patient returns today in follow up of his aneurysm.  He had to undergo right hypogastric artery coil embolization and extension to the right external iliac artery for a late type Ib endoleak roughly a decade after his initial repair.  He has done well since that time.  That was about 3 years ago.  He has no current aneurysm related symptoms.  He is having some problems with swelling and pain in his lower leg from his varicose veins particularly on the left side.  This is not terribly bothersome to him, but it is more noticeable. His duplex today demonstrates a patent stent graft without endoleak.  The maximal aortic diameter is about 3.1 cm which is unchanged from his study last year.  Current Outpatient Medications  Medication Sig Dispense Refill   acetaminophen  (TYLENOL ) 500 MG tablet Take 1,000 mg by mouth every 6 (six) hours as needed for moderate pain.     apixaban  (ELIQUIS ) 5 MG TABS tablet Take 1 tablet (5 mg total) by mouth 2 (two) times daily. 180 tablet 3   buPROPion  (WELLBUTRIN  SR) 200 MG 12 hr tablet Take 1 tablet (200 mg total) by mouth 2 (two) times daily. 180 tablet 2   busPIRone  (BUSPAR ) 10 MG tablet Take 2 tablets (20 mg total) by mouth 3 (three) times daily. 540 tablet 1   Cholecalciferol  50 MCG (2000 UT) CAPS Take 1 capsule by mouth daily.      citalopram  (CELEXA ) 20 MG tablet Take 1 tablet (20 mg total) by mouth daily. 90 tablet 3   ezetimibe  (ZETIA ) 10 MG tablet Take 1 tablet (10 mg total) by mouth daily. 90 tablet 4   levothyroxine  (SYNTHROID ) 150 MCG tablet Take 1 tablet (150 mcg total) by mouth daily. 90 tablet 3   linaclotide  (LINZESS ) 145 MCG CAPS capsule Take 1-2 capsules (145-290 mcg total) by mouth daily before breakfast. Only take extra capsule as needed (290 MCG dosing). 60 capsule 12   metoprolol   succinate (TOPROL -XL) 25 MG 24 hr tablet Take 1 tablet (25 mg total) by mouth 2 (two) times daily. 180 tablet 3   nitroGLYCERIN  (NITROSTAT ) 0.4 MG SL tablet Place 1 tablet (0.4 mg total) under the tongue every 5 (five) minutes x 3 doses as needed for chest pain. 25 tablet 2   omeprazole  (PRILOSEC) 20 MG capsule Take 1 capsule (20 mg total) by mouth daily. 90 capsule 4   rosuvastatin  (CRESTOR ) 40 MG tablet Take 1 tablet (40 mg total) by mouth daily. 90 tablet 4   sacubitril -valsartan  (ENTRESTO ) 24-26 MG Take 1 tablet by mouth 2 (two) times daily. 180 tablet 3   Suvorexant  (BELSOMRA ) 20 MG TABS Take 1 tablet (20 mg total) by mouth at bedtime as needed. 30 tablet 4   No current facility-administered medications for this visit.    Past Medical History:  Diagnosis Date   AAA (abdominal aortic aneurysm) (HCC)    a.) s/p EVAR in 08/2007 by Dr. Primus. b.) CTA on 06/30/2021 --> interval aneurysmal dilitation superior to stent graft with the juxtarenal aorta measuring 3.8 cm.   Allergy    Anemia    Arthritis    both knees   Chronic anticoagulation    Apixaban    Chronic HFrEF (heart failure with reduced ejection fraction) (HCC)    a. 07/2022 Echo: EF 35-40%, glob  HK, mild LVH, GrI DD, nl RV fxn, mildly dil RA/LA, mild MR, mild AoV sclerosis. Asc Ao 40mm.   CKD (chronic kidney disease), stage II    Coronary artery disease    a. 06/2007 CABGx4 @ [facility] (LIMA-LAD, VG-RPDA, VG-OM3,VG-D2); b. 2011 Cath: patent grafts EF 40%, c.06/05/18 Cath: Sev native dzs, LIMA-LAD patent, VG-RPDA patent, VG-OM3 mid-graft 60% and 80%; d. 07/2022 Cath: LM nl, LAD 50p, 90p/m, D1 70, RI  30, LCX 40ost/p, 100p CTO, OM3 100, RCA 100p CTO, LIMA->LAD ok, VG->D2 ok, VG->RPDA min irregs, VG->OM3 100->Med rx.   Depression    GERD (gastroesophageal reflux disease)    Hernia of abdominal cavity    History of 2019 novel coronavirus disease (COVID-19) 09/2019   Hyperlipidemia    Hypertension    Hypothyroidism    Iliac artery aneurysm,  right (HCC)    a.) at the distal landing zone of the right iliac limb measuring 3 cm   Ischemic cardiomyopathy    Right middle lobe pulmonary nodule 06/30/2021   a.) measured 8 mm by CT on 06/30/2021.   S/P CABG x 4 06/25/2007   a.) LIMA-LAD, SVG-RPDA, SVG-OM3, SVG-D2   Sigmoid diverticulosis    Ventricular tachycardia (HCC) 05/2018   a.) found unresponsive by EMS; VT noted --> defibrillation achieved ROSC. Briefly intubated. Episode felt to be secondary to heat stoke.    Past Surgical History:  Procedure Laterality Date   ABDOMINAL AORTIC ANEURYSM REPAIR N/A 08/29/2007   Procedure: EVAR; Location: [facility]; Surgeon: Prentice Roger, MD   ABDOMINAL AORTIC ANEURYSM REPAIR N/A 12/05/2012   Procedure: Abdominal aortic aneurysm of approximately 6 cm in maximal diameter status post previous endovascular repair with type I and III endoleaks; Location: [facility]; Surgeon: Selinda Gu, MD   CARDIAC CATHETERIZATION Left 06/28/2010   3v CAD with patent CABG grafts; LVEF 40%; stable aortic stent graft; Location: [facility]; Surgeon: Wolm Rhyme, MD   CARDIAC CATHETERIZATION Left 06/18/2007   3v CAD; LVEF 50%; refer to CVTS for CABG; Location: [facility]; Surgeon: Deatrice Cage, MD   CARDIOVERSION N/A 05/22/2023   Procedure: CARDIOVERSION;  Surgeon: Cage Deatrice LABOR, MD;  Location: [facility] ORS;  Service: Cardiovascular;  Laterality: N/A;   COLONOSCOPY WITH PROPOFOL  N/A 11/17/2021   Procedure: COLONOSCOPY WITH PROPOFOL ;  Surgeon: Unk Corinn Skiff, MD;  Location: [facility] ENDOSCOPY;  Service: Gastroenterology;  Laterality: N/A;   CORONARY ARTERY BYPASS GRAFT N/A 06/25/2007   Procedure: 4v CABG (LIMA-LAD, SVG-RPDA, SVG-OM3, SVG-D2); Location: [facility]; Surgeon: Maude Sharps, MD   CORONARY/GRAFT ANGIOGRAPHY N/A 06/05/2018   Procedure: EL ANGIOGRAPHY;  Surgeon: Mady Bruckner, MD;  Location: [facility] INVASIVE CV LAB;  Service: Cardiovascular;  Laterality: N/A;   ELECTROPHYSIOLOGY STUDY N/A 05/26/2023   Procedure:  ELECTROPHYSIOLOGY STUDY;  Surgeon: Cindie Ole DASEN, MD;  Location: [facility] INVASIVE CV LAB;  Service: Cardiovascular;  Laterality: N/A;   ENDOVASCULAR REPAIR/STENT GRAFT N/A 08/04/2021   Procedure: ENDOVASCULAR REPAIR/STENT GRAFT;  Surgeon: Gu Selinda RAMAN, MD;  Location: [facility] INVASIVE CV LAB;  Service: Cardiovascular;  Laterality: N/A;   EXPLORATORY LAPAROTOMY  1997   HERNIA REPAIR     ICD IMPLANT N/A 07/03/2023   Procedure: ICD IMPLANT;  Surgeon: Cindie Ole DASEN, MD;  Location: [facility] INVASIVE CV LAB;  Service: Cardiovascular;  Laterality: N/A;   KNEE ARTHROPLASTY Left 09/19/2016   Procedure: COMPUTER ASSISTED TOTAL KNEE ARTHROPLASTY;  Surgeon: Lynwood SHAUNNA Hue, MD;  Location: [facility] ORS;  Service: Orthopedics;  Laterality: Left;   RIGHT/LEFT HEART CATH AND CORONARY ANGIOGRAPHY N/A 08/05/2022   Procedure: RIGHT/LEFT HEART  CATH AND CORONARY ANGIOGRAPHY;  Surgeon: Darron Deatrice LABOR, MD;  Location: [facility] INVASIVE CV LAB;  Service: Cardiovascular;  Laterality: N/A;   TOTAL KNEE ARTHROPLASTY Right 09/2016     Social History   Tobacco Use   Smoking status: Former    Current packs/day: 0.00    Average packs/day: 1 pack/day for 35.0 years (35.0 ttl pk-yrs)    Types: Cigarettes    Start date: 12/16/1970    Quit date: 12/16/2005    Years since quitting: 18.6    Passive exposure: Past   Smokeless tobacco: Former    Types: Chew   Tobacco comments:    pt states he very rarely uses smokeless tobacco   Vaping Use   Vaping status: Never Used  Substance Use Topics   Alcohol use: No   Drug use: No      Family History  Problem Relation Age of Onset   Heart disease Mother    Heart attack Mother    Hypertension Mother    Heart attack Father    Hypertension Father    Kidney disease Brother    Depression Maternal Grandmother    Depression Maternal Grandfather    Heart disease Other    Heart attack Other    Prostate cancer Neg Hx    Kidney cancer Neg Hx    Stomach cancer Neg Hx    Colon cancer Neg Hx      Allergies  Allergen Reactions   Benzodiazepines     Paradoxical agitation and delirium   Codeine Nausea Only   Tetracycline Rash     REVIEW OF SYSTEMS (Negative unless checked)   Constitutional: [] Weight loss  [] Fever  [] Chills Cardiac: [] Chest pain   [] Chest pressure   [x] Palpitations   [] Shortness of breath when laying flat   [] Shortness of breath at rest   [x] Shortness of breath with exertion. Vascular:  [x] Pain in legs with walking   [] Pain in legs at rest   [] Pain in legs when laying flat   [x] Claudication   [] Pain in feet when walking  [] Pain in feet at rest  [] Pain in feet when laying flat   [] History of DVT   [] Phlebitis   [] Swelling in legs   [] Varicose veins   [] Non-healing ulcers Pulmonary:   [] Uses home oxygen   [] Productive cough   [] Hemoptysis   [] Wheeze  [] COPD   [] Asthma Neurologic:  [] Dizziness  [] Blackouts   [] Seizures   [] History of stroke   [] History of TIA  [] Aphasia   [] Temporary blindness   [] Dysphagia   [] Weakness or numbness in arms   [] Weakness or numbness in legs Musculoskeletal:  [x] Arthritis   [] Joint swelling   [x] Joint pain   [] Low back pain Hematologic:  [] Easy bruising  [] Easy bleeding   [] Hypercoagulable state   [] Anemic   Gastrointestinal:  [] Blood in stool   [] Vomiting blood  [] Gastroesophageal reflux/heartburn   [] Abdominal pain Genitourinary:  [x] Chronic kidney disease   [] Difficult urination  [] Frequent urination  [] Burning with urination   [] Hematuria Skin:  [] Rashes   [] Ulcers   [] Wounds Psychological:  [] History of anxiety   []  History of major depression.  Physical Examination  BP 99/63   Pulse 73   Ht 5' 11 (1.803 m)   Wt 188 lb 6 oz (85.4 kg)   BMI 26.27 kg/m  Gen:  WD/WN, NAD Head: [place]/AT, No temporalis wasting. Ear/Nose/Throat: Hearing grossly intact, nares w/o erythema or drainage Eyes: Conjunctiva clear. Sclera non-icteric Neck: Supple.  Trachea midline Pulmonary:  Good air  movement, no use of accessory muscles.  Cardiac:  RRR, no JVD Vascular:  Vessel Right Left  Radial Palpable Palpable                          PT Palpable Palpable  DP Palpable Palpable   Gastrointestinal: soft, non-tender/non-distended. No guarding/reflex.  Musculoskeletal: M/S 5/5 throughout.  No deformity or atrophy. No edema. Neurologic: Sensation grossly intact in extremities.  Symmetrical.  Speech is fluent.  Psychiatric: Judgment intact, Mood & affect appropriate for pt's clinical situation. Dermatologic: No rashes or ulcers noted.  No cellulitis or open wounds.      Labs Recent Results (from the past 2160 hours)  Microalbumin, Urine Waived     Status: Abnormal   Collection Time: 05/08/24  2:48 PM  Result Value Ref Range   Microalb, Ur Waived 30 (H) 0 - 19 mg/L   Creatinine, Urine Waived 200 10 - 300 mg/dL   Microalb/Creat Ratio <30 <30 mg/g    Comment:                              Abnormal:       30 - 300                         High Abnormal:           >300   CBC with Differential/Platelet     Status: Abnormal   Collection Time: 05/08/24  2:50 PM  Result Value Ref Range   WBC 10.5 3.4 - 10.8 x10E3/uL   RBC 4.73 4.14 - 5.80 x10E6/uL   Hemoglobin 13.5 13.0 - 17.7 g/dL   Hematocrit 57.0 62.4 - 51.0 %   MCV 91 79 - 97 fL   MCH 28.5 26.6 - 33.0 pg   MCHC 31.5 31.5 - 35.7 g/dL   RDW 86.7 88.3 - 84.5 %   Platelets 182 150 - 450 x10E3/uL   Neutrophils 45 Not Estab. %   Lymphs 43 Not Estab. %   Monocytes 7 Not Estab. %   Eos 4 Not Estab. %   Basos 1 Not Estab. %   Neutrophils Absolute 4.8 1.4 - 7.0 x10E3/uL   Lymphocytes Absolute 4.5 (H) 0.7 - 3.1 x10E3/uL   Monocytes Absolute 0.7 0.1 - 0.9 x10E3/uL   EOS (ABSOLUTE) 0.4 0.0 - 0.4 x10E3/uL   Basophils Absolute 0.1 0.0 - 0.2 x10E3/uL   Immature Granulocytes 0 Not Estab. %   Immature Grans (Abs) 0.0 0.0 - 0.1 x10E3/uL  Comprehensive metabolic panel with GFR     Status: Abnormal   Collection Time: 05/08/24  2:50 PM  Result Value Ref Range   Glucose 96 70 - 99  mg/dL   BUN 29 (H) 8 - 27 mg/dL   Creatinine, Ser 8.28 (H) 0.76 - 1.27 mg/dL   eGFR 42 (L) >40 fO/fpw/8.26   BUN/Creatinine Ratio 17 10 - 24   Sodium 142 134 - 144 mmol/L   Potassium 4.9 3.5 - 5.2 mmol/L   Chloride 108 (H) 96 - 106 mmol/L   CO2 18 (L) 20 - 29 mmol/L   Calcium  9.7 8.6 - 10.2 mg/dL   Total Protein 6.5 6.0 - 8.5 g/dL   Albumin 4.2 3.8 - 4.8 g/dL   Globulin, Total 2.3 1.5 - 4.5 g/dL   Bilirubin Total 0.5 0.0 - 1.2 mg/dL   Alkaline Phosphatase 111 44 - 121  IU/L   AST 25 0 - 40 IU/L   ALT 12 0 - 44 IU/L  Lipid Panel w/o Chol/HDL Ratio     Status: None   Collection Time: 05/08/24  2:50 PM  Result Value Ref Range   Cholesterol, Total 135 100 - 199 mg/dL   Triglycerides 879 0 - 149 mg/dL   HDL 48 >60 mg/dL   VLDL Cholesterol Cal 21 5 - 40 mg/dL   LDL Chol Calc (NIH) 66 0 - 99 mg/dL  TSH     Status: None   Collection Time: 05/08/24  2:50 PM  Result Value Ref Range   TSH 0.666 0.450 - 4.500 uIU/mL  T4, free     Status: None   Collection Time: 05/08/24  2:50 PM  Result Value Ref Range   Free T4 1.77 0.82 - 1.77 ng/dL  HgB J8r     Status: Abnormal   Collection Time: 05/08/24  2:50 PM  Result Value Ref Range   Hgb A1c MFr Bld 6.1 (H) 4.8 - 5.6 %    Comment:          Prediabetes: 5.7 - 6.4          Diabetes: >6.4          Glycemic control for adults with diabetes: <7.0    Est. average glucose Bld gHb Est-mCnc 128 mg/dL  CUP PACEART REMOTE DEVICE CHECK     Status: None   Collection Time: 07/02/24 12:19 AM  Result Value Ref Range   Date Time Interrogation Session [id number]    Pulse Generator Manufacturer MERM    Pulse Gen Model [id number] Cobalt XT DR MRI    Pulse Gen Serial Number [id number]    Clinic Name [facility]    Implantable Pulse Generator Type Implantable Cardiac Defibulator    Implantable Pulse Generator Implant Date 79759180    Implantable Lead Manufacturer MERM    Implantable Lead Model 5076 CapSureFix Novus MRI SureScan    Implantable Lead  Serial Number [id number]    Implantable Lead Implant Date 79759180    Implantable Lead Location Detail 1 APPENDAGE    Implantable Lead Location [id number]    Implantable Lead Connection Status [id number]    Implantable Lead Manufacturer [facility]    Implantable Lead Model 651-029-1171 Sprint Quattro Secure S MRI SureScan    Implantable Lead Serial Number [id number]    Implantable Lead Implant Date 79759180    Implantable Lead Location Detail 1 SEPTUM    Implantable Lead Location [id number]    Implantable Lead Connection Status [id number]    Lead Channel Setting Sensing Sensitivity 0.3 mV   Lead Channel Setting Pacing Amplitude 1.5 V   Lead Channel Setting Pacing Pulse Width 0.4 ms   Lead Channel Setting Pacing Amplitude 2.0 V   Zone Setting Status Active    Zone Setting Status Inactive    Zone Setting Status Active    Zone Setting Status 755011    Zone Setting Status 755011    Zone Setting Status Active    Lead Channel Impedance Value 494 ohm   Lead Channel Sensing Intrinsic Amplitude 2.4 mV   Lead Channel Pacing Threshold Amplitude 0.75 V   Lead Channel Pacing Threshold Pulse Width 0.4 ms   Lead Channel Impedance Value 380 ohm   Lead Channel Impedance Value 456 ohm   Lead Channel Sensing Intrinsic Amplitude 12.3 mV   Lead Channel Pacing Threshold Amplitude 0.625 V   Lead Channel Pacing Threshold Pulse Width 0.4 ms   HighPow  Impedance 71 ohm   Battery Remaining Longevity 141 mo   Battery Voltage 3.02 V   Brady Statistic RA Percent Paced 23.78 %   Brady Statistic RV Percent Paced 0.05 %   Brady Statistic AP VP Percent 0.01 %   Brady Statistic AS VP Percent 0.04 %   Brady Statistic AP VS Percent 24.07 %   Brady Statistic AS VS Percent 75.88 %    Radiology CUP PACEART REMOTE DEVICE CHECK Result Date: 07/03/2024 ICD scheduled remote reviewed. Normal device function.  Presenting rhythm: AS/AP-VS 1 AT/AF x 1 min on 06/16/24. Next remote 91 days. AB, CVRS   Assessment/Plan  AAA (abdominal aortic  aneurysm) (HCC) His duplex today demonstrates a patent stent graft without endoleak.  The maximal aortic diameter is about 3.1 cm which is unchanged from his study last year.  Continue to follow on an annual basis.  No change in medications.  Varicose veins of leg with pain, left We discussed the role of compression, elevation, and exercise to try to reduce the pain and swelling from venous insufficiency.  At this point, he does not want to pursue further evaluation or invasive options but if his symptoms worsen he will contact our office.  Benign hypertension with CKD (chronic kidney disease) stage III (HCC) blood pressure control important in reducing the progression of atherosclerotic disease and aneurysmal growth. On appropriate oral medications.     CKD (chronic kidney disease) stage 3, GFR 30-59 ml/min (HCC) Follow aneurysm repair with duplex to avoid contrast unless dramatic growth is seen.   Hyperlipidemia lipid control important in reducing the progression of atherosclerotic disease. Continue statin therapy  Selinda Gu, MD  07/19/2024 10:11 AM    This note was created with Dragon medical transcription system.  Any errors from dictation are purely unintentional

## 2024-08-06 ENCOUNTER — Ambulatory Visit (INDEPENDENT_AMBULATORY_CARE_PROVIDER_SITE_OTHER): Admitting: *Deleted

## 2024-08-06 ENCOUNTER — Telehealth: Payer: Self-pay | Admitting: *Deleted

## 2024-08-06 VITALS — Ht 71.0 in | Wt 188.0 lb

## 2024-08-06 DIAGNOSIS — Z0000 Encounter for general adult medical examination without abnormal findings: Secondary | ICD-10-CM

## 2024-08-06 NOTE — Telephone Encounter (Signed)
 Left message to confirm patient is aware she is not seeing the MD for this visit, it is for [facility].

## 2024-08-06 NOTE — Patient Instructions (Signed)
 Stuart Harvey , Thank you for taking time to come for your Medicare Wellness Visit. I appreciate your ongoing commitment to your health goals. Please review the following plan we discussed and let me know if I can assist you in the future.   Screening recommendations/referrals:  Recommended yearly ophthalmology/optometry visit for glaucoma screening and checkup Recommended yearly dental visit for hygiene and checkup  Vaccinations: Influenza vaccine:  Pneumococcal vaccine:  Tdap vaccine:  Shingles vaccine:        Preventive Care 65 Years and Older, Male Preventive care refers to lifestyle choices and visits with your health care provider that can promote health and wellness. What does preventive care include? A yearly physical exam. This is also called an annual well check. Dental exams once or twice a year. Routine eye exams. Ask your health care provider how often you should have your eyes checked. Personal lifestyle choices, including: Daily care of your teeth and gums. Regular physical activity. Eating a healthy diet. Avoiding tobacco and drug use. Limiting alcohol use. Practicing safe sex. Taking low doses of aspirin  every day. Taking vitamin and mineral supplements as recommended by your health care provider. What happens during an annual well check? The services and screenings done by your health care provider during your annual well check will depend on your age, overall health, lifestyle risk factors, and family history of disease. Counseling  Your health care provider may ask you questions about your: Alcohol use. Tobacco use. Drug use. Emotional well-being. Home and relationship well-being. Sexual activity. Eating habits. History of falls. Memory and ability to understand (cognition). Work and work Astronomer. Screening  You may have the following tests or measurements: Height, weight, and BMI. Blood pressure. Lipid and cholesterol levels. These may be checked  every 5 years, or more frequently if you are over 34 years old. Skin check. Lung cancer screening. You may have this screening every year starting at age 22 if you have a 30-pack-year history of smoking and currently smoke or have quit within the past 15 years. Fecal occult blood test (FOBT) of the stool. You may have this test every year starting at age 58. Flexible sigmoidoscopy or colonoscopy. You may have a sigmoidoscopy every 5 years or a colonoscopy every 10 years starting at age 44. Prostate cancer screening. Recommendations will vary depending on your family history and other risks. Hepatitis C blood test. Hepatitis B blood test. Sexually transmitted disease (STD) testing. Diabetes screening. This is done by checking your blood sugar (glucose) after you have not eaten for a while (fasting). You may have this done every 1-3 years. Abdominal aortic aneurysm (AAA) screening. You may need this if you are a current or former smoker. Osteoporosis. You may be screened starting at age 25 if you are at high risk. Talk with your health care provider about your test results, treatment options, and if necessary, the need for more tests. Vaccines  Your health care provider may recommend certain vaccines, such as: Influenza vaccine. This is recommended every year. Tetanus, diphtheria, and acellular pertussis (Tdap, Td) vaccine. You may need a Td booster every 10 years. Zoster vaccine. You may need this after age 33. Pneumococcal 13-valent conjugate (PCV13) vaccine. One dose is recommended after age 70. Pneumococcal polysaccharide (PPSV23) vaccine. One dose is recommended after age 55. Talk to your health care provider about which screenings and vaccines you need and how often you need them. This information is not intended to replace advice given to you by your health care  provider. Make sure you discuss any questions you have with your health care provider. Document Released: 11/27/2015 Document  Revised: 07/20/2016 Document Reviewed: 09/01/2015 Elsevier Interactive Patient Education  2017 [employer].  Fall Prevention in the Home Falls can cause injuries. They can happen to people of all ages. There are many things you can do to make your home safe and to help prevent falls. What can I do on the outside of my home? Regularly fix the edges of walkways and driveways and fix any cracks. Remove anything that might make you trip as you walk through a door, such as a raised step or threshold. Trim any bushes or trees on the path to your home. Use bright outdoor lighting. Clear any walking paths of anything that might make someone trip, such as rocks or tools. Regularly check to see if handrails are loose or broken. Make sure that both sides of any steps have handrails. Any raised decks and porches should have guardrails on the edges. Have any leaves, snow, or ice cleared regularly. Use sand or salt on walking paths during winter. Clean up any spills in your garage right away. This includes oil or grease spills. What can I do in the bathroom? Use night lights. Install grab bars by the toilet and in the tub and shower. Do not use towel bars as grab bars. Use non-skid mats or decals in the tub or shower. If you need to sit down in the shower, use a plastic, non-slip stool. Keep the floor dry. Clean up any water that spills on the floor as soon as it happens. Remove soap buildup in the tub or shower regularly. Attach bath mats securely with double-sided non-slip rug tape. Do not have throw rugs and other things on the floor that can make you trip. What can I do in the bedroom? Use night lights. Make sure that you have a light by your bed that is easy to reach. Do not use any sheets or blankets that are too big for your bed. They should not hang down onto the floor. Have a firm chair that has side arms. You can use this for support while you get dressed. Do not have throw rugs and other  things on the floor that can make you trip. What can I do in the kitchen? Clean up any spills right away. Avoid walking on wet floors. Keep items that you use a lot in easy-to-reach places. If you need to reach something above you, use a strong step stool that has a grab bar. Keep electrical cords out of the way. Do not use floor polish or wax that makes floors slippery. If you must use wax, use non-skid floor wax. Do not have throw rugs and other things on the floor that can make you trip. What can I do with my stairs? Do not leave any items on the stairs. Make sure that there are handrails on both sides of the stairs and use them. Fix handrails that are broken or loose. Make sure that handrails are as long as the stairways. Check any carpeting to make sure that it is firmly attached to the stairs. Fix any carpet that is loose or worn. Avoid having throw rugs at the top or bottom of the stairs. If you do have throw rugs, attach them to the floor with carpet tape. Make sure that you have a light switch at the top of the stairs and the bottom of the stairs. If you do not have  them, ask someone to add them for you. What else can I do to help prevent falls? Wear shoes that: Do not have high heels. Have rubber bottoms. Are comfortable and fit you well. Are closed at the toe. Do not wear sandals. If you use a stepladder: Make sure that it is fully opened. Do not climb a closed stepladder. Make sure that both sides of the stepladder are locked into place. Ask someone to hold it for you, if possible. Clearly mark and make sure that you can see: Any grab bars or handrails. First and last steps. Where the edge of each step is. Use tools that help you move around (mobility aids) if they are needed. These include: Canes. Walkers. Scooters. Crutches. Turn on the lights when you go into a dark area. Replace any light bulbs as soon as they burn out. Set up your furniture so you have a clear  path. Avoid moving your furniture around. If any of your floors are uneven, fix them. If there are any pets around you, be aware of where they are. Review your medicines with your doctor. Some medicines can make you feel dizzy. This can increase your chance of falling. Ask your doctor what other things that you can do to help prevent falls. This information is not intended to replace advice given to you by your health care provider. Make sure you discuss any questions you have with your health care provider. Document Released: 08/27/2009 Document Revised: 04/07/2016 Document Reviewed: 12/05/2014 Elsevier Interactive Patient Education  2017 [employer].

## 2024-08-06 NOTE — Progress Notes (Signed)
 Subjective:   Stuart Harvey is a 73 y.o. male who presents for Medicare Annual/Subsequent preventive examination.  Visit Complete: Virtual I connected with  Stuart Harvey on 08/06/24 by a audio enabled telemedicine application and verified that I am speaking with the correct person using two identifiers.  Patient Location: Home  Provider Location: Home Office  I discussed the limitations of evaluation and management by telemedicine. The patient expressed understanding and agreed to proceed.  Vital Signs: Because this visit was a virtual/telehealth visit, some criteria may be missing or patient reported. Any vitals not documented were not able to be obtained and vitals that have been documented are patient reported.    Cardiac Risk Factors include: advanced age (>24men, >26 women);obesity (BMI >30kg/m2);male gender;hypertension     Objective:    Today's Vitals   08/06/24 1553  Weight: 188 lb (85.3 kg)  Height: 5' 11 (1.803 m)   Body mass index is 26.22 kg/m.     08/06/2024    3:50 PM 07/03/2023    2:19 PM 05/25/2023    6:00 PM 05/22/2023    7:22 AM 05/19/2023    7:15 PM 05/18/2023    4:38 PM 09/12/2022   11:10 AM  Advanced Directives  Does Patient Have a Medical Advance Directive? Yes Yes Yes No No Unable to assess, patient is non-responsive or altered mental status Yes  Type of Sales promotion account executive of [employer] Power of [employer] Power of [place];Living will  Does patient want to make changes to medical advance directive?  No - Guardian declined No - Patient declined    No - Patient declined  Copy of Healthcare Power of Attorney in Chart? No - copy requested No - copy requested No - copy requested      Would patient like information on creating a medical advance directive?  No - Patient declined No - Patient declined No - Patient declined No - Patient declined      Current Medications  (verified) Outpatient Encounter Medications as of 08/06/2024  Medication Sig   acetaminophen  (TYLENOL ) 500 MG tablet Take 1,000 mg by mouth every 6 (six) hours as needed for moderate pain.   apixaban  (ELIQUIS ) 5 MG TABS tablet Take 1 tablet (5 mg total) by mouth 2 (two) times daily.   buPROPion  (WELLBUTRIN  SR) 200 MG 12 hr tablet Take 1 tablet (200 mg total) by mouth 2 (two) times daily.   busPIRone  (BUSPAR ) 10 MG tablet Take 2 tablets (20 mg total) by mouth 3 (three) times daily.   Cholecalciferol  50 MCG (2000 UT) CAPS Take 1 capsule by mouth daily.    citalopram  (CELEXA ) 20 MG tablet Take 1 tablet (20 mg total) by mouth daily.   ezetimibe  (ZETIA ) 10 MG tablet Take 1 tablet (10 mg total) by mouth daily.   levothyroxine  (SYNTHROID ) 150 MCG tablet Take 1 tablet (150 mcg total) by mouth daily.   linaclotide  (LINZESS ) 145 MCG CAPS capsule Take 1-2 capsules (145-290 mcg total) by mouth daily before breakfast. Only take extra capsule as needed (290 MCG dosing).   metoprolol  succinate (TOPROL -XL) 25 MG 24 hr tablet Take 1 tablet (25 mg total) by mouth 2 (two) times daily.   nitroGLYCERIN  (NITROSTAT ) 0.4 MG SL tablet Place 1 tablet (0.4 mg total) under the tongue every 5 (five) minutes x 3 doses as needed for chest pain.   omeprazole  (PRILOSEC) 20 MG capsule Take 1 capsule (20 mg total) by mouth daily.  rosuvastatin  (CRESTOR ) 40 MG tablet Take 1 tablet (40 mg total) by mouth daily.   sacubitril -valsartan  (ENTRESTO ) 24-26 MG Take 1 tablet by mouth 2 (two) times daily.   Suvorexant  (BELSOMRA ) 20 MG TABS Take 1 tablet (20 mg total) by mouth at bedtime as needed.   No facility-administered encounter medications on file as of 08/06/2024.    Allergies (verified) Benzodiazepines, Codeine, and Tetracycline   History: Past Medical History:  Diagnosis Date   AAA (abdominal aortic aneurysm)    a.) s/p EVAR in 08/2007 by Dr. Primus. b.) CTA on 06/30/2021 --> interval aneurysmal dilitation superior to stent  graft with the juxtarenal aorta measuring 3.8 cm.   Allergy    Anemia    Arthritis    both knees   Chronic anticoagulation    Apixaban    Chronic HFrEF (heart failure with reduced ejection fraction) (HCC)    a. 07/2022 Echo: EF 35-40%, glob HK, mild LVH, GrI DD, nl RV fxn, mildly dil RA/LA, mild MR, mild AoV sclerosis. Asc Ao 40mm.   CKD (chronic kidney disease), stage II    Coronary artery disease    a. 06/2007 CABGx4 @ [facility] (LIMA-LAD, VG-RPDA, VG-OM3,VG-D2); b. 2011 Cath: patent grafts EF 40%, c.06/05/18 Cath: Sev native dzs, LIMA-LAD patent, VG-RPDA patent, VG-OM3 mid-graft 60% and 80%; d. 07/2022 Cath: LM nl, LAD 50p, 90p/m, D1 70, RI  30, LCX 40ost/p, 100p CTO, OM3 100, RCA 100p CTO, LIMA->LAD ok, VG->D2 ok, VG->RPDA min irregs, VG->OM3 100->Med rx.   Depression    GERD (gastroesophageal reflux disease)    Hernia of abdominal cavity    History of 2019 novel coronavirus disease (COVID-19) 09/2019   Hyperlipidemia    Hypertension    Hypothyroidism    Iliac artery aneurysm, right    a.) at the distal landing zone of the right iliac limb measuring 3 cm   Ischemic cardiomyopathy    Right middle lobe pulmonary nodule 06/30/2021   a.) measured 8 mm by CT on 06/30/2021.   S/P CABG x 4 06/25/2007   a.) LIMA-LAD, SVG-RPDA, SVG-OM3, SVG-D2   Sigmoid diverticulosis    Ventricular tachycardia (HCC) 05/2018   a.) found unresponsive by EMS; VT noted --> defibrillation achieved ROSC. Briefly intubated. Episode felt to be secondary to heat stoke.   Past Surgical History:  Procedure Laterality Date   ABDOMINAL AORTIC ANEURYSM REPAIR N/A 08/29/2007   Procedure: EVAR; Location: [facility]; Surgeon: Stuart Roger, MD   ABDOMINAL AORTIC ANEURYSM REPAIR N/A 12/05/2012   Procedure: Abdominal aortic aneurysm of approximately 6 cm in maximal diameter status post previous endovascular repair with type I and III endoleaks; Location: [facility]; Surgeon: Stuart Gu, MD   CARDIAC CATHETERIZATION Left 06/28/2010   3v CAD  with patent CABG grafts; LVEF 40%; stable aortic stent graft; Location: [facility]; Surgeon: Stuart Rhyme, MD   CARDIAC CATHETERIZATION Left 06/18/2007   3v CAD; LVEF 50%; refer to CVTS for CABG; Location: [facility]; Surgeon: Stuart Cage, MD   CARDIOVERSION N/A 05/22/2023   Procedure: CARDIOVERSION;  Surgeon: Harvey Stuart LABOR, MD;  Location: [facility] ORS;  Service: Cardiovascular;  Laterality: N/A;   COLONOSCOPY WITH PROPOFOL  N/A 11/17/2021   Procedure: COLONOSCOPY WITH PROPOFOL ;  Surgeon: Unk Corinn Skiff, MD;  Location: [facility] ENDOSCOPY;  Service: Gastroenterology;  Laterality: N/A;   CORONARY ARTERY BYPASS GRAFT N/A 06/25/2007   Procedure: 4v CABG (LIMA-LAD, SVG-RPDA, SVG-OM3, SVG-D2); Location: [facility]; Surgeon: Maude Sharps, MD   CORONARY/GRAFT ANGIOGRAPHY N/A 06/05/2018   Procedure: EL ANGIOGRAPHY;  Surgeon: Mady Bruckner, MD;  Location: [facility]  INVASIVE CV LAB;  Service: Cardiovascular;  Laterality: N/A;   ELECTROPHYSIOLOGY STUDY N/A 05/26/2023   Procedure: ELECTROPHYSIOLOGY STUDY;  Surgeon: Cindie Ole DASEN, MD;  Location: [facility] INVASIVE CV LAB;  Service: Cardiovascular;  Laterality: N/A;   ENDOVASCULAR REPAIR/STENT GRAFT N/A 08/04/2021   Procedure: ENDOVASCULAR REPAIR/STENT GRAFT;  Surgeon: Marea Stuart RAMAN, MD;  Location: [facility] INVASIVE CV LAB;  Service: Cardiovascular;  Laterality: N/A;   EXPLORATORY LAPAROTOMY  1997   HERNIA REPAIR     ICD IMPLANT N/A 07/03/2023   Procedure: ICD IMPLANT;  Surgeon: Cindie Ole DASEN, MD;  Location: [facility] INVASIVE CV LAB;  Service: Cardiovascular;  Laterality: N/A;   KNEE ARTHROPLASTY Left 09/19/2016   Procedure: COMPUTER ASSISTED TOTAL KNEE ARTHROPLASTY;  Surgeon: Lynwood SHAUNNA Hue, MD;  Location: [facility] ORS;  Service: Orthopedics;  Laterality: Left;   RIGHT/LEFT HEART CATH AND CORONARY ANGIOGRAPHY N/A 08/05/2022   Procedure: RIGHT/LEFT HEART CATH AND CORONARY ANGIOGRAPHY;  Surgeon: Darron Stuart LABOR, MD;  Location: [facility] INVASIVE CV LAB;  Service: Cardiovascular;   Laterality: N/A;   TOTAL KNEE ARTHROPLASTY Right 09/2016   Family History  Problem Relation Age of Onset   Heart disease Mother    Heart attack Mother    Hypertension Mother    Heart attack Father    Hypertension Father    Kidney disease Brother    Depression Maternal Grandmother    Depression Maternal Grandfather    Heart disease Other    Heart attack Other    Prostate cancer Neg Hx    Kidney cancer Neg Hx    Stomach cancer Neg Hx    Colon cancer Neg Hx    Social History   Socioeconomic History   Marital status: Married    Spouse name: Not on file   Number of children: Not on file   Years of education: 14   Highest education level: 12th grade  Occupational History   Occupation: retired  Tobacco Use   Smoking status: Former    Current packs/day: 0.00    Average packs/day: 1 pack/day for 35.0 years (35.0 ttl pk-yrs)    Types: Cigarettes    Start date: 12/16/1970    Quit date: 12/16/2005    Years since quitting: 18.6    Passive exposure: Past   Smokeless tobacco: Former    Types: Chew   Tobacco comments:    pt states he very rarely uses smokeless tobacco   Vaping Use   Vaping status: Never Used  Substance and Sexual Activity   Alcohol use: No   Drug use: No   Sexual activity: Yes    Partners: Female  Other Topics Concern   Not on file  Social History Narrative   Not on file   Social Drivers of Health   Financial Resource Strain: Low Risk  (08/06/2024)   Overall Financial Resource Strain (CARDIA)    Difficulty of Paying Living Expenses: Not hard at all  Food Insecurity: No Food Insecurity (08/06/2024)   Hunger Vital Sign    Worried About Running Out of Food in the Last Year: Never true    Ran Out of Food in the Last Year: Never true  Transportation Needs: No Transportation Needs (08/06/2024)   PRAPARE - Administrator, Civil Service (Medical): No    Lack of Transportation (Non-Medical): No  Physical Activity: Insufficiently Active (08/06/2024)    Exercise Vital Sign    Days of Exercise per Week: 1 day    Minutes of Exercise per Session: 10 min  Stress: No Stress Concern Present (08/06/2024)   [employer] of Occupational Health - Occupational Stress Questionnaire    Feeling of Stress: Only a little  Social Connections: Socially Integrated (08/06/2024)   Social Connection and Isolation Panel    Frequency of Communication with Friends and Family: More than three times a week    Frequency of Social Gatherings with Friends and Family: Three times a week    Attends Religious Services: More than 4 times per year    Active Member of Clubs or Organizations: Yes    Attends Engineer, structural: More than 4 times per year    Marital Status: Married    Tobacco Counseling Counseling given: Not Answered Tobacco comments: pt states he very rarely uses smokeless tobacco    Clinical Intake:  Pre-visit preparation completed: Yes  Pain : No/denies pain     Diabetes: No  How often do you need to have someone help you when you read instructions, pamphlets, or other written materials from your doctor or pharmacy?: 1 - Never  Interpreter Needed?: No  Information entered by :: Mliss Graff LPN   Activities of Daily Living    08/06/2024    3:53 PM 08/05/2024   12:31 PM  In your present state of health, do you have any difficulty performing the following activities:  Hearing? 0 0  Vision? 0 0  Difficulty concentrating or making decisions? 0 0  Walking or climbing stairs? 0 0  Dressing or bathing? 0 0  Doing errands, shopping? 0 0  Preparing Food and eating ? N N  Using the Toilet? N N  In the past six months, have you accidently leaked urine? N N  Do you have problems with loss of bowel control? N N  Managing your Medications? N N  Managing your Finances? N N  Housekeeping or managing your Housekeeping? N N    Patient Care Team: Cannady, [name], NP as PCP - General (Nurse Practitioner) Darron Stuart LABOR, MD as  PCP - Cardiology (Cardiology) Cindie Ole DASEN, MD as PCP - Electrophysiology (Clinical Cardiac Electrophysiology) Marea Stuart RAMAN, MD as Referring Physician (Vascular Surgery) Darron Stuart LABOR, MD as Consulting Physician (Cardiology) Arloa Mliss RAMAN, [facility] (Inactive) (Pharmacist) Mevelyn JONETTA Bathe, OD (Optometry) Artis Debby LELON MADISON, DMD as Referring Physician (Dentistry) [facility] (Nephrology) Deanna Channing LABOR, [facility] (Pharmacist) Riddle, Suzann, NP as Nurse Practitioner (Clinical Cardiac Electrophysiology) Gerard Frederick, NP as Nurse Practitioner (Cardiology)  Indicate any recent Medical Services you may have received from other than [facility] providers in the past year (date may be approximate).     Assessment:   This is a routine wellness examination for [facility].  Hearing/Vision screen Hearing Screening - Comments:: Bilateral hearing aids Vision Screening - Comments:: Up to date woodard   Goals Addressed             This Visit's Progress    DIET - INCREASE WATER INTAKE   On track    Recommend drinking at least 6-8 glasses of water a day      Patient Stated   On track    08/03/2020,stay healthy     Patient Stated       Better health       Depression Screen    08/06/2024    3:54 PM 05/08/2024    2:39 PM 02/01/2024   11:30 AM 11/14/2023   11:19 AM 10/24/2023    3:08 PM 09/26/2023    3:59 PM 08/11/2023  3:17 PM  PHQ 2/9 Scores  PHQ - 2 Score 1 1 2 2 4 1  0  PHQ- 9 Score 1 9 8 5 12 8 2     Fall Risk    08/06/2024    3:55 PM 08/05/2024   12:31 PM 05/08/2024    2:39 PM 11/14/2023   11:17 AM 09/26/2023    3:59 PM  Fall Risk   Falls in the past year? 0 0 0 0 0  Number falls in past yr: 0  0 0 0  Injury with Fall? 0  0 0 0  Risk for fall due to :   No Fall Risks No Fall Risks No Fall Risks  Follow up Falls evaluation completed;Education provided;Falls prevention discussed  Falls evaluation completed Falls evaluation completed Falls evaluation  completed    MEDICARE RISK AT HOME: Medicare Risk at Home Any stairs in or around the home?: No Home free of loose throw rugs in walkways, pet beds, electrical cords, etc?: Yes Adequate lighting in your home to reduce risk of falls?: Yes Life alert?: No Grab bars in the bathroom?: No Shower chair or bench in shower?: Yes Elevated toilet seat or a handicapped toilet?: No  TIMED UP AND GO:  Was the test performed?  No    Cognitive Function:    07/01/2016    1:52 PM  MMSE - Mini Mental State Exam  Orientation to time 5   Orientation to Place 5   Registration 3   Attention/ Calculation 5   Recall 2   Language- name 2 objects 2   Language- repeat 1  Language- follow 3 step command 3   Language- read & follow direction 1   Write a sentence 1   Copy design 1   Total score 29      Data saved with a previous flowsheet row definition        08/06/2024    3:52 PM 08/11/2023    3:51 PM 08/09/2022   11:05 AM 08/06/2021   11:23 AM 08/03/2020   11:23 AM  6CIT Screen  What Year? 0 points 0 points 0 points 0 points 0 points  What month? 0 points 0 points 0 points 0 points 0 points  What time? 0 points 0 points 0 points 0 points 0 points  Count back from 20 0 points 0 points 0 points 0 points 0 points  Months in reverse 0 points 0 points 0 points 0 points 0 points  Repeat phrase 4 points 0 points 2 points 0 points 0 points  Total Score 4 points 0 points 2 points 0 points 0 points    Immunizations Immunization History  Administered Date(s) Administered   Fluad Quad(high Dose 65+) 09/29/2021   Janssen (J&J) SARS-COV-2 Vaccination 03/09/2020   Pneumococcal Conjugate-13 07/01/2016   Pneumococcal Polysaccharide-23 10/07/2009, 07/07/2017   Tdap 06/16/2015   Zoster, Live 06/16/2015    TDAP status: Due, Education has been provided regarding the importance of this vaccine. Advised may receive this vaccine at local pharmacy or Health Dept. Aware to provide a copy of the vaccination  record if obtained from local pharmacy or Health Dept. Verbalized acceptance and understanding.  Flu Vaccine status: Due, Education has been provided regarding the importance of this vaccine. Advised may receive this vaccine at local pharmacy or Health Dept. Aware to provide a copy of the vaccination record if obtained from local pharmacy or Health Dept. Verbalized acceptance and understanding.  Pneumococcal vaccine status: Due, Education has been provided regarding  the importance of this vaccine. Advised may receive this vaccine at local pharmacy or Health Dept. Aware to provide a copy of the vaccination record if obtained from local pharmacy or Health Dept. Verbalized acceptance and understanding.  Covid-19 vaccine status: Information provided on how to obtain vaccines.   Qualifies for Shingles Vaccine? Yes   Zostavax completed No   Shingrix Completed?: No.    Education has been provided regarding the importance of this vaccine. Patient has been advised to call insurance company to determine out of pocket expense if they have not yet received this vaccine. Advised may also receive vaccine at local pharmacy or Health Dept. Verbalized acceptance and understanding.  Screening Tests Health Maintenance  Topic Date Due   Zoster Vaccines- Shingrix (1 of 2) 06/27/1970   COVID-19 Vaccine (2 - Janssen risk series) 04/06/2020   Mammogram  10/27/2023   Influenza Vaccine  06/14/2024   DTaP/Tdap/Td (2 - Td or Tdap) 06/15/2025   Medicare Annual Wellness (AWV)  08/06/2025   Colonoscopy  11/17/2028   Pneumococcal Vaccine: 50+ Years  Completed   Hepatitis C Screening  Completed   HPV VACCINES  Aged Out   Meningococcal B Vaccine  Aged Out    Health Maintenance  Health Maintenance Due  Topic Date Due   Zoster Vaccines- Shingrix (1 of 2) 06/27/1970   COVID-19 Vaccine (2 - Janssen risk series) 04/06/2020   Mammogram  10/27/2023   Influenza Vaccine  06/14/2024    Colorectal cancer screening: No  longer required.   Lung Cancer Screening: (Low Dose CT Chest recommended if Age 64-80 years, 20 pack-year currently smoking OR have quit w/in 15years.) does not qualify.   Lung Cancer Screening Referral:   Additional Screening:  Hepatitis C Screening: does not qualify; Completed 2019  Vision Screening: Recommended annual ophthalmology exams for early detection of glaucoma and other disorders of the eye. Is the patient up to date with their annual eye exam?  Yes  Who is the provider or what is the name of the office in which the patient attends annual eye exams? Unsure of name If pt is not established with a provider, would they like to be referred to a provider to establish care? No .   Dental Screening: Recommended annual dental exams for proper oral hygiene   Community Resource Referral / Chronic Care Management: CRR required this visit?  No   CCM required this visit?  No     Plan:     I have personally reviewed and noted the following in the patient's chart:   Medical and social history Use of alcohol, tobacco or illicit drugs  Current medications and supplements including opioid prescriptions. Patient is not currently taking opioid prescriptions. Functional ability and status Nutritional status Physical activity Advanced directives List of other physicians Hospitalizations, surgeries, and ER visits in previous 12 months Vitals Screenings to include cognitive, depression, and falls Referrals and appointments  In addition, I have reviewed and discussed with patient certain preventive protocols, quality metrics, and best practice recommendations. A written personalized care plan for preventive services as well as general preventive health recommendations were provided to patient.     Mliss Graff, LPN   [id number]   After Visit Summary: (MyChart) Due to this being a telephonic visit, the after visit summary with patients personalized plan was offered to patient via  MyChart   Nurse Notes:

## 2024-08-07 ENCOUNTER — Telehealth: Payer: Self-pay

## 2024-08-07 NOTE — Telephone Encounter (Signed)
 Copied from CRM [id number]. Topic: Appointments - Appointment Info/Confirmation >> Aug 06, 2024  3:29 PM Montie POUR wrote: Patient/patient representative is calling for information regarding an appointment.  [name] or [name] did not received a call from Mliss Harvey or  anyone else from this office. Please call [name] at [phone] to discuss.  In appointment note from today it had this: Stuart Harvey will call-- AWVs call [phone] khc declined virtual visit. confirmed awv) Thanks

## 2024-08-07 NOTE — Progress Notes (Signed)
Remote ICD Transmission.

## 2024-08-16 ENCOUNTER — Encounter: Payer: Self-pay | Admitting: Nurse Practitioner

## 2024-08-16 ENCOUNTER — Ambulatory Visit (INDEPENDENT_AMBULATORY_CARE_PROVIDER_SITE_OTHER): Admitting: Nurse Practitioner

## 2024-08-16 VITALS — BP 89/56 | HR 65 | Temp 98.4°F | Resp 15 | Ht 70.98 in | Wt 190.6 lb

## 2024-08-16 DIAGNOSIS — F419 Anxiety disorder, unspecified: Secondary | ICD-10-CM

## 2024-08-16 DIAGNOSIS — F324 Major depressive disorder, single episode, in partial remission: Secondary | ICD-10-CM | POA: Diagnosis not present

## 2024-08-16 DIAGNOSIS — K5901 Slow transit constipation: Secondary | ICD-10-CM

## 2024-08-16 DIAGNOSIS — F5104 Psychophysiologic insomnia: Secondary | ICD-10-CM

## 2024-08-16 NOTE — Assessment & Plan Note (Signed)
 Ongoing. Improved sleep at this time since returning to use of Celexa .  Wishes to slowly reduce off Belsomra , discussed with his wife and him how to perform this reduction.  They have lower dose pills left at home still.

## 2024-08-16 NOTE — Assessment & Plan Note (Signed)
 Refer to depression plan of care.

## 2024-08-16 NOTE — Progress Notes (Signed)
 BP (!) 89/56 (BP Location: Left Arm, Patient Position: Sitting, Cuff Size: Large)   Pulse 65   Temp 98.4 F (36.9 C) (Oral)   Resp 15   Ht 5' 10.98 (1.803 m)   Wt 190 lb 9.6 oz (86.5 kg)   SpO2 97%   BMI 26.60 kg/m    Subjective:    Patient ID: Stuart Harvey, male    DOB: 03/16/1951, 73 y.o.   MRN: [id number]  HPI: Stuart Harvey is a 73 y.o. male  Chief Complaint  Patient presents with   Anxiety    For the most part doing ok, wife agrees.    Insomnia    Belsomra - is working and he is sleeping. Would like to consider if he is able to stop.    Constipation    Hasn't had to use the Linzess  is about 2 weeks and no issues as of yet.    INSOMNIA Taking Belsomra . Takes Celexa , Buspar , and Wellbutrin  for mood, we changed back to Celexa  at a recent visit. He had to stop for a period due to taking Amiodarone . Is sleeping a lot better now and would like to slowly come off of this.  Has not needed to use Linzess  in two weeks, passing bowels well.    Medications tried: Lunesta , Trazodone , Buspar , Seroquel  (in 2019). Duration: chronic Satisfied with sleep quality: no Difficulty falling asleep: yes Difficulty staying asleep: no Waking a few hours after sleep onset: no Early morning awakenings: no Daytime hypersomnolence: no Wakes feeling refreshed: no Good sleep hygiene: yes Apnea: no Snoring: no Depressed/anxious mood: yes Recent stress: yes Restless legs/nocturnal leg cramps: no Chronic pain/arthritis: no History of sleep study: no Treatments attempted: as above      08/06/2024    3:54 PM 05/08/2024    2:39 PM 02/01/2024   11:30 AM 11/14/2023   11:19 AM 10/24/2023    3:08 PM  Depression screen PHQ 2/9  Decreased Interest 0 0 1 1 2   Down, Depressed, Hopeless 1 1 1 1 2   PHQ - 2 Score 1 1 2 2 4   Altered sleeping 0 3 3 1 2   Tired, decreased energy 0 2 1 1 2   Change in appetite 0 1 1 0 1  Feeling bad or failure about yourself  0 0 0 0 0  Trouble concentrating 0 2 1 1  2   Moving slowly or fidgety/restless 0 0 0 0 1  Suicidal thoughts 0 0 0 0 0  PHQ-9 Score 1 9 8 5 12   Difficult doing work/chores Not difficult at all Somewhat difficult  Not difficult at all Somewhat difficult       05/08/2024    2:40 PM 02/01/2024   11:31 AM 10/24/2023    3:08 PM 09/26/2023    4:00 PM  GAD 7 : Generalized Anxiety Score  Nervous, Anxious, on Edge 1 1 2 1   Control/stop worrying 1 1 1 1   Worry too much - different things 1 2 1 1   Trouble relaxing 1 1 1 1   Restless 0 1 1 1   Easily annoyed or irritable 0 1 1 1   Afraid - awful might happen 1 1 1 2   Total GAD 7 Score 5 8 8 8   Anxiety Difficulty Somewhat difficult Somewhat difficult Somewhat difficult Somewhat difficult   Relevant past medical, surgical, family and social history reviewed and updated as indicated. Interim medical history since our last visit reviewed. Allergies and medications reviewed and updated.  Review of Systems  Constitutional:  Negative for activity change, diaphoresis, fatigue and fever.  Respiratory:  Negative for cough, chest tightness, shortness of breath and wheezing.   Cardiovascular:  Negative for chest pain, palpitations and leg swelling.  Gastrointestinal: Negative.   Neurological: Negative.   Psychiatric/Behavioral:  Negative for decreased concentration, self-injury, sleep disturbance and suicidal ideas. The patient is nervous/anxious.     Per HPI unless specifically indicated above     Objective:    BP (!) 89/56 (BP Location: Left Arm, Patient Position: Sitting, Cuff Size: Large)   Pulse 65   Temp 98.4 F (36.9 C) (Oral)   Resp 15   Ht 5' 10.98 (1.803 m)   Wt 190 lb 9.6 oz (86.5 kg)   SpO2 97%   BMI 26.60 kg/m   Wt Readings from Last 3 Encounters:  08/16/24 190 lb 9.6 oz (86.5 kg)  08/06/24 188 lb (85.3 kg)  07/19/24 188 lb 6 oz (85.4 kg)    Physical Exam Vitals and nursing note reviewed.  Constitutional:      General: He is awake. He is not in acute distress.     Appearance: Normal appearance. He is well-developed and well-groomed. He is not ill-appearing or toxic-appearing.  HENT:     Head: Normocephalic.     Right Ear: Hearing and external ear normal.     Left Ear: Hearing and external ear normal.  Eyes:     General: Lids are normal.     Extraocular Movements: Extraocular movements intact.     Conjunctiva/sclera: Conjunctivae normal.  Neck:     Thyroid : No thyromegaly.     Vascular: No carotid bruit.  Cardiovascular:     Rate and Rhythm: Normal rate and regular rhythm.     Heart sounds: Normal heart sounds. No murmur heard.    No gallop.  Pulmonary:     Effort: No accessory muscle usage or respiratory distress.     Breath sounds: Normal breath sounds.  Abdominal:     General: Bowel sounds are normal. There is no distension.     Palpations: Abdomen is soft. There is no hepatomegaly or mass.     Tenderness: There is no abdominal tenderness. There is no right CVA tenderness or left CVA tenderness.  Musculoskeletal:     Cervical back: Full passive range of motion without pain.     Right lower leg: No edema.     Left lower leg: No edema.  Lymphadenopathy:     Cervical: No cervical adenopathy.  Skin:    General: Skin is warm.     Capillary Refill: Capillary refill takes less than 2 seconds.  Neurological:     Mental Status: He is alert and oriented to person, place, and time.     Deep Tendon Reflexes: Reflexes are normal and symmetric.     Reflex Scores:      Brachioradialis reflexes are 2+ on the right side and 2+ on the left side.      Patellar reflexes are 2+ on the right side and 2+ on the left side. Psychiatric:        Attention and Perception: Attention normal.        Mood and Affect: Mood normal.        Speech: Speech normal.        Behavior: Behavior normal. Behavior is cooperative.        Thought Content: Thought content normal.    Results for orders placed or performed in visit on 07/02/24  CUP PACEART REMOTE DEVICE CHECK  Collection Time: 07/02/24 12:19 AM  Result Value Ref Range   Date Time Interrogation Session [id number]    Pulse Generator Manufacturer MERM    Pulse Gen Model [id number] Cobalt XT DR MRI    Pulse Gen Serial Number [id number]    Clinic Name [facility]    Implantable Pulse Generator Type Implantable Cardiac Defibulator    Implantable Pulse Generator Implant Date 79759180    Implantable Lead Manufacturer MERM    Implantable Lead Model 5076 CapSureFix Novus MRI SureScan    Implantable Lead Serial Number [id number]    Implantable Lead Implant Date 79759180    Implantable Lead Location Detail 1 APPENDAGE    Implantable Lead Location [id number]    Implantable Lead Connection Status [id number]    Implantable Lead Manufacturer [facility]    Implantable Lead Model [id number] Sprint Quattro Secure S MRI SureScan    Implantable Lead Serial Number [id number]    Implantable Lead Implant Date 79759180    Implantable Lead Location Detail 1 SEPTUM    Implantable Lead Location [id number]    Implantable Lead Connection Status [id number]    Lead Channel Setting Sensing Sensitivity 0.3 mV   Lead Channel Setting Pacing Amplitude 1.5 V   Lead Channel Setting Pacing Pulse Width 0.4 ms   Lead Channel Setting Pacing Amplitude 2.0 V   Zone Setting Status Active    Zone Setting Status Inactive    Zone Setting Status Active    Zone Setting Status 755011    Zone Setting Status 755011    Zone Setting Status Active    Lead Channel Impedance Value 494 ohm   Lead Channel Sensing Intrinsic Amplitude 2.4 mV   Lead Channel Pacing Threshold Amplitude 0.75 V   Lead Channel Pacing Threshold Pulse Width 0.4 ms   Lead Channel Impedance Value 380 ohm   Lead Channel Impedance Value 456 ohm   Lead Channel Sensing Intrinsic Amplitude 12.3 mV   Lead Channel Pacing Threshold Amplitude 0.625 V   Lead Channel Pacing Threshold Pulse Width 0.4 ms   HighPow Impedance 71 ohm   Battery Remaining Longevity 141 mo   Battery Voltage 3.02 V    Brady Statistic RA Percent Paced 23.78 %   Brady Statistic RV Percent Paced 0.05 %   Brady Statistic AP VP Percent 0.01 %   Brady Statistic AS VP Percent 0.04 %   Brady Statistic AP VS Percent 24.07 %   Brady Statistic AS VS Percent 75.88 %      Assessment & Plan:   Problem List Items Addressed This Visit       Digestive   Slow transit constipation   Improved.  Having BM daily without straining.  Will continue Linzess  on as needed basis. Goal is to not push to diarrhea stools daily, but to prevent straining.        Other   Insomnia   Ongoing. Improved sleep at this time since returning to use of Celexa .  Wishes to slowly reduce off Belsomra , discussed with his wife and him how to perform this reduction.  They have lower dose pills left at home still.      Depression   Chronic, improved with Celexa  back on board. 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  Continue Wellbutrin  as ordered (max dose which he has been on for years), Celexa , and Buspar  20 MG TID (max dose, with goal to reduce in future).  Denies SI/HI.  Monitor Wellbutrin  and Toprol   XL use -- discussed may need to consider mood medication adjustments in future.  Would benefit therapy in future.   - Goals in long run is to reduce off Buspar  and Belsomra  -- he is going to start with reducing Belsomra .      Anxiety - Primary   Refer to depression plan of care.        Follow up plan: Return in about 3 months (around 11/25/2024) for HTN/HLD, ANXIETY, Depression, CKD, IFG.

## 2024-08-16 NOTE — Assessment & Plan Note (Signed)
 Chronic, improved with Celexa  back on board. 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  Continue Wellbutrin  as ordered (max dose which he has been on for years), Celexa , and Buspar  20 MG TID (max dose, with goal to reduce in future).  Denies SI/HI.  Monitor Wellbutrin  and Toprol  XL use -- discussed may need to consider mood medication adjustments in future.  Would benefit therapy in future.   - Goals in long run is to reduce off Buspar  and Belsomra  -- he is going to start with reducing Belsomra .

## 2024-08-16 NOTE — Patient Instructions (Signed)
 Be Involved in Caring For Your Health:  Taking Medications When medications are taken as directed, they can greatly improve your health. But if they are not taken as prescribed, they may not work. In some cases, not taking them correctly can be harmful. To help ensure your treatment remains effective and safe, understand your medications and how to take them. Bring your medications to each visit for review by your provider.  Your lab results, notes, and after visit summary will be available on My Chart. We strongly encourage you to use this feature. If lab results are abnormal the clinic will contact you with the appropriate steps. If the clinic does not contact you assume the results are satisfactory. You can always view your results on My Chart. If you have questions regarding your health or results, please contact the clinic during office hours. You can also ask questions on My Chart.  We at [facility] are grateful that you chose us  to provide your care. We strive to provide evidence-based and compassionate care and are always looking for feedback. If you get a survey from the clinic please complete this so we can hear your opinions.  Managing Depression, Adult Depression is a mental health condition that affects your thoughts, feelings, and actions. Being diagnosed with depression can bring you relief if you did not know why you have felt or behaved a certain way. It could also leave you feeling overwhelmed. Finding ways to manage your symptoms can help you feel more positive about your future. How to manage lifestyle changes Being depressed is difficult. Depression can increase the level of everyday stress. Stress can make depression symptoms worse. You may believe your symptoms cannot be managed or will never improve. However, there are many things you can try to help manage your symptoms. There is hope. Managing stress  Stress is your body's reaction to life changes and events,  both good and bad. Stress can add to your feelings of depression. Learning to manage your stress can help lessen your feelings of depression. Try some of the following approaches to reducing your stress (stress reduction techniques): Listen to music that you enjoy and that inspires you. Try using a meditation app or take a meditation class. Develop a practice that helps you connect with your spiritual self. Walk in nature, pray, or go to a place of worship. Practice deep breathing. To do this, inhale slowly through your nose. Pause at the top of your inhale for a few seconds and then exhale slowly, letting yourself relax. Repeat this three or four times. Practice yoga to help relax and work your muscles. Choose a stress reduction technique that works for you. These techniques take time and practice to develop. Set aside 5-15 minutes a day to do them. Therapists can offer training in these techniques. Do these things to help manage stress: Keep a journal. Know your limits. Set healthy boundaries for yourself and others, such as saying no when you think something is too much. Pay attention to how you react to certain situations. You may not be able to control everything, but you can change your reaction. Add humor to your life by watching funny movies or shows. Make time for activities that you enjoy and that relax you. Spend less time using electronics, especially at night before bed. The light from screens can make your brain think it is time to get up rather than go to bed.  Medicines Medicines, such as antidepressants, are often a part of  treatment for depression. Talk with your pharmacist or health care provider about all the medicines, supplements, and herbal products that you take, their possible side effects, and what medicines and other products are safe to take together. Make sure to report any side effects you may have to your health care provider. Relationships Your health care  provider may suggest family therapy, couples therapy, or individual therapy as part of your treatment. How to recognize changes Everyone responds differently to treatment for depression. As you recover from depression, you may start to: Have more interest in doing activities. Feel more hopeful. Have more energy. Eat a more regular amount of food. Have better mental focus. It is important to recognize if your depression is not getting better or is getting worse. The symptoms you had in the beginning may return, such as: Feeling tired. Eating too much or too little. Sleeping too much or too little. Feeling restless, agitated, or hopeless. Trouble focusing or making decisions. Having unexplained aches and pains. Feeling irritable, angry, or aggressive. If you or your family members notice these symptoms coming back, let your health care provider know right away. Follow these instructions at home: Activity Try to get some form of exercise each day, such as walking. Try yoga, mindfulness, or other stress reduction techniques. Participate in group activities if you are able. Lifestyle Get enough sleep. Cut down on or stop using caffeine, tobacco, alcohol, and any other harmful substances. Eat a healthy diet that includes plenty of vegetables, fruits, whole grains, low-fat dairy products, and lean protein. Limit foods that are high in solid fats, added sugar, or salt (sodium). General instructions Take over-the-counter and prescription medicines only as told by your health care provider. Keep all follow-up visits. It is important for your health care provider to check on your mood, behavior, and medicines. Your health care provider may need to make changes to your treatment. Where to find support Talking to others  Friends and family members can be sources of support and guidance. Talk to trusted friends or family members about your condition. Explain your symptoms and let them know that you  are working with a health care provider to treat your depression. Tell friends and family how they can help. Finances Find mental health providers that fit with your financial situation. Talk with your health care provider if you are worried about access to food, housing, or medicine. Call your insurance company to learn about your co-pays and prescription plan. Where to find more information You can find support in your area from: Anxiety and Depression Association of America (ADAA): adaa.org Mental Health America: mentalhealthamerica.net The [employer] on Mental Illness: nami.org Contact a health care provider if: You stop taking your antidepressant medicines, and you have any of these symptoms: Nausea. Headache. Light-headedness. Chills and body aches. Not being able to sleep (insomnia). You or your friends and family think your depression is getting worse. Get help right away if: You have thoughts of hurting yourself or others. Get help right away if you feel like you may hurt yourself or others, or have thoughts about taking your own life. Go to your nearest emergency room or: Call 911. Call the National Suicide Prevention Lifeline at (743) 630-7432 or 988. This is open 24 hours a day. Text the Crisis Text Line at 854-590-3828. This information is not intended to replace advice given to you by your health care provider. Make sure you discuss any questions you have with your health care provider. Document Revised: 03/08/2022 Document Reviewed:  03/08/2022 Elsevier Patient Education  2024 [employer].

## 2024-08-16 NOTE — Assessment & Plan Note (Signed)
 Improved.  Having BM daily without straining.  Will continue Linzess  on as needed basis. Goal is to not push to diarrhea stools daily, but to prevent straining.

## 2024-10-01 ENCOUNTER — Ambulatory Visit: Payer: Medicare HMO

## 2024-10-01 DIAGNOSIS — I472 Ventricular tachycardia, unspecified: Secondary | ICD-10-CM | POA: Diagnosis not present

## 2024-10-02 ENCOUNTER — Ambulatory Visit: Payer: Self-pay | Admitting: Cardiology

## 2024-10-02 LAB — CUP PACEART REMOTE DEVICE CHECK
Battery Remaining Longevity: 138 mo
Battery Voltage: 3.01 V
Brady Statistic AP VP Percent: 0.02 %
Brady Statistic AP VS Percent: 38.03 %
Brady Statistic AS VP Percent: 0.05 %
Brady Statistic AS VS Percent: 61.9 %
Brady Statistic RA Percent Paced: 37.53 %
Brady Statistic RV Percent Paced: 0.07 %
Date Time Interrogation Session: 20251117222518
HighPow Impedance: 60 Ohm
Implantable Lead Connection Status: 753985
Implantable Lead Connection Status: 753985
Implantable Lead Implant Date: 20240819
Implantable Lead Implant Date: 20240819
Implantable Lead Location: 753859
Implantable Lead Location: 753860
Implantable Lead Model: 5076
Implantable Pulse Generator Implant Date: 20240819
Lead Channel Impedance Value: 342 Ohm
Lead Channel Impedance Value: 399 Ohm
Lead Channel Impedance Value: 475 Ohm
Lead Channel Pacing Threshold Amplitude: 0.5 V
Lead Channel Pacing Threshold Amplitude: 0.75 V
Lead Channel Pacing Threshold Pulse Width: 0.4 ms
Lead Channel Pacing Threshold Pulse Width: 0.4 ms
Lead Channel Sensing Intrinsic Amplitude: 11.3 mV
Lead Channel Sensing Intrinsic Amplitude: 2.8 mV
Lead Channel Setting Pacing Amplitude: 1.5 V
Lead Channel Setting Pacing Amplitude: 2 V
Lead Channel Setting Pacing Pulse Width: 0.4 ms
Lead Channel Setting Sensing Sensitivity: 0.3 mV
Zone Setting Status: 755011
Zone Setting Status: 755011

## 2024-10-04 NOTE — Progress Notes (Signed)
 Remote ICD Transmission

## 2024-10-14 ENCOUNTER — Other Ambulatory Visit: Payer: Self-pay | Admitting: Nurse Practitioner

## 2024-10-16 ENCOUNTER — Telehealth: Payer: Self-pay | Admitting: Emergency Medicine

## 2024-10-16 NOTE — Telephone Encounter (Signed)
 Wife sent message to get an appt with Dr Darron - appt made - denies problems

## 2024-10-17 NOTE — Telephone Encounter (Signed)
 Requested Prescriptions  Refused Prescriptions Disp Refills   levothyroxine  (SYNTHROID ) 150 MCG tablet [Pharmacy Med Name: LEVOTHYROXINE  150 MCG TABLET] 90 tablet 0    Sig: Take 1 tablet (150 mcg total) by mouth daily.     Endocrinology:  Hypothyroid Agents Passed - 10/17/2024 11:20 AM      Passed - TSH in normal range and within 360 days    TSH  Date Value Ref Range Status  05/08/2024 0.666 0.450 - 4.500 uIU/mL Final         Passed - Valid encounter within last 12 months    Recent Outpatient Visits           2 months ago Anxiety   [facility] [place], [place] T, NP   3 months ago Major depressive disorder with single episode, in partial remission   [place] [facility] [place], [place] T, NP   5 months ago Heart failure with reduced ejection fraction (HCC)   [place] [facility] [place], [place] T, NP   7 months ago Major depressive disorder with single episode, in partial remission   [place] [facility] [place], [place] T, NP   8 months ago Slow transit constipation   [place] [facility] [place], Melanie DASEN, NP       Future Appointments             In 1 month Arida, Deatrice LABOR, MD [facility]

## 2024-11-08 ENCOUNTER — Other Ambulatory Visit: Payer: Self-pay | Admitting: Nurse Practitioner

## 2024-11-16 NOTE — Patient Instructions (Signed)
 Be Involved in Caring For Your Health:  Taking Medications When medications are taken as directed, they can greatly improve your health. But if they are not taken as prescribed, they may not work. In some cases, not taking them correctly can be harmful. To help ensure your treatment remains effective and safe, understand your medications and how to take them. Bring your medications to each visit for review by your provider.  Your lab results, notes, and after visit summary will be available on My Chart. We strongly encourage you to use this feature. If lab results are abnormal the clinic will contact you with the appropriate steps. If the clinic does not contact you assume the results are satisfactory. You can always view your results on My Chart. If you have questions regarding your health or results, please contact the clinic during office hours. You can also ask questions on My Chart.  We at [facility] are grateful that you chose [place] to provide your care. We strive to provide evidence-based and compassionate care and are always looking for feedback. If you get a survey from the clinic please complete this so we can hear your opinions.  Heart-Healthy Eating Plan Many factors influence your heart health, including eating and exercise habits. Heart health is also called coronary health. Coronary risk increases with abnormal blood fat (lipid) levels. A heart-healthy eating plan includes limiting unhealthy fats, increasing healthy fats, limiting salt (sodium) intake, and making other diet and lifestyle changes. What is my plan? Your health care provider may recommend that: You limit your fat intake to _________% or less of your total calories each day. You limit your saturated fat intake to _________% or less of your total calories each day. You limit the amount of cholesterol in your diet to less than _________ mg per day. You limit the amount of sodium in your diet to less than _________  mg per day. What are tips for following this plan? Cooking Cook foods using methods other than frying. Baking, boiling, grilling, and broiling are all good options. Other ways to reduce fat include: Removing the skin from poultry. Removing all visible fats from meats. Steaming vegetables in water or broth. Meal planning  At meals, imagine dividing your plate into fourths: Fill one-half of your plate with vegetables and green salads. Fill one-fourth of your plate with whole grains. Fill one-fourth of your plate with lean protein foods. Eat 2-4 cups of vegetables per day. One cup of vegetables equals 1 cup (91 g) broccoli or cauliflower florets, 2 medium carrots, 1 large bell pepper, 1 large sweet potato, 1 large tomato, 1 medium white potato, 2 cups (150 g) raw leafy greens. Eat 1-2 cups of fruit per day. One cup of fruit equals 1 small apple, 1 large banana, 1 cup (237 g) mixed fruit, 1 large orange,  cup (82 g) dried fruit, 1 cup (240 mL) 100% fruit juice. Eat more foods that contain soluble fiber. Examples include apples, broccoli, carrots, beans, peas, and barley. Aim to get 25-30 g of fiber per day. Increase your consumption of legumes, nuts, and seeds to 4-5 servings per week. One serving of dried beans or legumes equals  cup (90 g) cooked, 1 serving of nuts is  oz (12 almonds, 24 pistachios, or 7 walnut halves), and 1 serving of seeds equals  oz (8 g). Fats Choose healthy fats more often. Choose monounsaturated and polyunsaturated fats, such as olive and canola oils, avocado oil, flaxseeds, walnuts, almonds, and seeds. Eat  more omega-3 fats. Choose salmon, mackerel, sardines, tuna, flaxseed oil, and ground flaxseeds. Aim to eat fish at least 2 times each week. Check food labels carefully to identify foods with trans fats or high amounts of saturated fat. Limit saturated fats. These are found in animal products, such as meats, butter, and cream. Plant sources of saturated fats  include palm oil, palm kernel oil, and coconut oil. Avoid foods with partially hydrogenated oils in them. These contain trans fats. Examples are stick margarine, some tub margarines, cookies, crackers, and other baked goods. Avoid fried foods. General information Eat more home-cooked food and less restaurant, buffet, and fast food. Limit or avoid alcohol. Limit foods that are high in added sugar and simple starches such as foods made using white refined flour (white breads, pastries, sweets). Lose weight if you are overweight. Losing just 5-10% of your body weight can help your overall health and prevent diseases such as diabetes and heart disease. Monitor your sodium intake, especially if you have high blood pressure. Talk with your health care provider about your sodium intake. Try to incorporate more vegetarian meals weekly. What foods should I eat? Fruits All fresh, canned (in natural juice), or frozen fruits. Vegetables Fresh or frozen vegetables (raw, steamed, roasted, or grilled). Green salads. Grains Most grains. Choose whole wheat and whole grains most of the time. Rice and pasta, including brown rice and pastas made with whole wheat. Meats and other proteins Lean, well-trimmed beef, veal, pork, and lamb. Chicken and [place] without skin. All fish and shellfish. Wild duck, rabbit, pheasant, and venison. Egg whites or low-cholesterol egg substitutes. Dried beans, peas, lentils, and tofu. Seeds and most nuts. Dairy Low-fat or nonfat cheeses, including ricotta and mozzarella. Skim or 1% milk (liquid, powdered, or evaporated). Buttermilk made with low-fat milk. Nonfat or low-fat yogurt. Fats and oils Non-hydrogenated (trans-free) margarines. Vegetable oils, including soybean, sesame, sunflower, olive, avocado, peanut, safflower, corn, canola, and cottonseed. Salad dressings or mayonnaise made with a vegetable oil. Beverages Water (mineral or sparkling). Coffee and tea. Unsweetened ice  tea. Diet beverages. Sweets and desserts Sherbet, gelatin, and fruit ice. Small amounts of dark chocolate. Limit all sweets and desserts. Seasonings and condiments All seasonings and condiments. The items listed above may not be a complete list of foods and beverages you can eat. Contact a dietitian for more options. What foods should I avoid? Fruits Canned fruit in heavy syrup. Fruit in cream or butter sauce. Fried fruit. Limit coconut. Vegetables Vegetables cooked in cheese, cream, or butter sauce. Fried vegetables. Grains Breads made with saturated or trans fats, oils, or whole milk. Croissants. Sweet rolls. Donuts. High-fat crackers, such as cheese crackers and chips. Meats and other proteins Fatty meats, such as hot dogs, ribs, sausage, bacon, rib-eye roast or steak. High-fat deli meats, such as salami and bologna. Caviar. Domestic duck and goose. Organ meats, such as liver. Dairy Cream, sour cream, cream cheese, and creamed cottage cheese. Whole-milk cheeses. Whole or 2% milk (liquid, evaporated, or condensed). Whole buttermilk. Cream sauce or high-fat cheese sauce. Whole-milk yogurt. Fats and oils Meat fat, or shortening. Cocoa butter, hydrogenated oils, palm oil, coconut oil, palm kernel oil. Solid fats and shortenings, including bacon fat, salt pork, lard, and butter. Nondairy cream substitutes. Salad dressings with cheese or sour cream. Beverages Regular sodas and any drinks with added sugar. Sweets and desserts Frosting. Pudding. Cookies. Cakes. Pies. Milk chocolate or white chocolate. Buttered syrups. Full-fat ice cream or ice cream drinks. The items listed above may  not be a complete list of foods and beverages to avoid. Contact a dietitian for more information. Summary Heart-healthy meal planning includes limiting unhealthy fats, increasing healthy fats, limiting salt (sodium) intake and making other diet and lifestyle changes. Lose weight if you are overweight. Losing just  5-10% of your body weight can help your overall health and prevent diseases such as diabetes and heart disease. Focus on eating a balance of foods, including fruits and vegetables, low-fat or nonfat dairy, lean protein, nuts and legumes, whole grains, and heart-healthy oils and fats. This information is not intended to replace advice given to you by your health care provider. Make sure you discuss any questions you have with your health care provider. Document Revised: 12/06/2021 Document Reviewed: 12/06/2021 Elsevier Patient Education  2024 [employer].

## 2024-11-18 ENCOUNTER — Other Ambulatory Visit: Payer: Self-pay | Admitting: Nurse Practitioner

## 2024-11-19 ENCOUNTER — Ambulatory Visit: Admitting: Nurse Practitioner

## 2024-11-19 ENCOUNTER — Encounter: Payer: Self-pay | Admitting: Nurse Practitioner

## 2024-11-19 VITALS — BP 131/81 | HR 66 | Temp 97.7°F | Resp 18 | Ht 70.98 in | Wt 189.8 lb

## 2024-11-19 DIAGNOSIS — N1831 Chronic kidney disease, stage 3a: Secondary | ICD-10-CM | POA: Diagnosis not present

## 2024-11-19 DIAGNOSIS — I5020 Unspecified systolic (congestive) heart failure: Secondary | ICD-10-CM

## 2024-11-19 DIAGNOSIS — E039 Hypothyroidism, unspecified: Secondary | ICD-10-CM

## 2024-11-19 DIAGNOSIS — E782 Mixed hyperlipidemia: Secondary | ICD-10-CM | POA: Diagnosis not present

## 2024-11-19 DIAGNOSIS — N2581 Secondary hyperparathyroidism of renal origin: Secondary | ICD-10-CM

## 2024-11-19 DIAGNOSIS — R7301 Impaired fasting glucose: Secondary | ICD-10-CM | POA: Diagnosis not present

## 2024-11-19 DIAGNOSIS — I480 Paroxysmal atrial fibrillation: Secondary | ICD-10-CM

## 2024-11-19 DIAGNOSIS — F324 Major depressive disorder, single episode, in partial remission: Secondary | ICD-10-CM

## 2024-11-19 DIAGNOSIS — I129 Hypertensive chronic kidney disease with stage 1 through stage 4 chronic kidney disease, or unspecified chronic kidney disease: Secondary | ICD-10-CM | POA: Diagnosis not present

## 2024-11-19 DIAGNOSIS — F5104 Psychophysiologic insomnia: Secondary | ICD-10-CM

## 2024-11-19 DIAGNOSIS — I472 Ventricular tachycardia, unspecified: Secondary | ICD-10-CM | POA: Diagnosis not present

## 2024-11-19 DIAGNOSIS — Z95810 Presence of automatic (implantable) cardiac defibrillator: Secondary | ICD-10-CM

## 2024-11-19 DIAGNOSIS — N183 Chronic kidney disease, stage 3 unspecified: Secondary | ICD-10-CM | POA: Diagnosis not present

## 2024-11-19 LAB — BAYER DCA HB A1C WAIVED: HB A1C (BAYER DCA - WAIVED): 5.5 % (ref 4.8–5.6)

## 2024-11-19 MED ORDER — BUSPIRONE HCL 10 MG PO TABS: 20.0000 mg | ORAL_TABLET | Freq: Three times a day (TID) | ORAL | 3 refills | Status: AC

## 2024-11-19 MED ORDER — VALSARTAN 40 MG PO TABS: 40.0000 mg | ORAL_TABLET | Freq: Every day | ORAL | 3 refills | Status: AC

## 2024-11-19 NOTE — Assessment & Plan Note (Signed)
 Chronic, stable. Labs up to date with nephrology. Recent notes reviewed.  Will continue this collaboration.

## 2024-11-19 NOTE — Assessment & Plan Note (Signed)
 Chronic, improved with Celexa  back on board. 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  Continue Wellbutrin  as ordered (max dose which he has been on for years), Celexa , and Buspar  20 MG TID (max dose, with goal to reduce in future).  Denies SI/HI.  Monitor Wellbutrin  and Toprol  XL use -- discussed may need to consider mood medication adjustments in future.  Would benefit therapy in future.   - Goals in long run is to reduce off Buspar  and Belsomra  to minimize medications.

## 2024-11-19 NOTE — Assessment & Plan Note (Signed)
 Chronic, stable.  BP right at goal today in office and at home is not consistently <130/80.  Entresto  was stopped by nephrology in November 2025 due to low BP and his insurance no longer covers this medication. Will start a low dose of Valsartan  40 MG daily.  Alerted cardiology via secure message since he sees them in 9 days. Educated his wife and him on this. Continue remainder of medications. Recommend he monitor BP at least a few mornings a week at home and document.  DASH diet at home.  Labs today: up to date. Urine ALB 13 May 2024.

## 2024-11-19 NOTE — Assessment & Plan Note (Signed)
Stable, has ICD placed on 07/03/23.  Continue collaboration with cardiology.  Recent notes reviewed.

## 2024-11-19 NOTE — Progress Notes (Signed)
 "  BP 131/81 (BP Location: Left Arm, Patient Position: Sitting, Cuff Size: Normal)   Pulse 66   Temp 97.7 F (36.5 C) (Oral)   Resp 18   Ht 5' 10.98 (1.803 m)   Wt 189 lb 12.8 oz (86.1 kg)   SpO2 97%   BMI 26.48 kg/m    Subjective:    Patient ID: Stuart Harvey, male    DOB: [birth year], 74 y.o.   MRN: [id number]  HPI: Stuart Harvey is a 74 y.o. male  Chief Complaint  Patient presents with   Hypertension   Hyperlipidemia   Depression   Anxiety   Follow-up    Here for follow up   Wife present with him at bedside.  HYPERTENSION / HYPERLIPIDEMIA Continues to take Eliquis , Zetia , Metoprolol ,and Crestor . Last saw cardiology on 07/11/24.[name] Abide with vascular and cardiology for AAA. Last vascular visit was on 07/19/24 with stable findings. Sees Dr. Darron next week. Entresto  stopped by nephrology due to low BP. EF on last echo July 2024 was 40-45%.  History: Had cardiac arrest on 05/18/23 during which he sustained rib fractures. Bypass surgery in 2008. Had ICD placed 06/20/23. Satisfied with current treatment? yes Duration of hypertension: chronic BP monitoring frequency: a few times a week BP range: 11/74 to 144/84 BP medication side effects: no Duration of hyperlipidemia: chronic Cholesterol medication side effects: no Cholesterol supplements: none Medication compliance: good compliance Aspirin : no Recent stressors: no Recurrent headaches: no Visual changes: no Palpitations: no Dyspnea: occasional with walking longer distances Chest pain: no Lower extremity edema: no Dizzy/lightheaded: no   Impaired Fasting Glucose HbA1C:  Lab Results  Component Value Date   HGBA1C 6.1 (H) 05/08/2024  Duration of elevated blood sugar: chronic Polydipsia: no Polyuria: no Weight change: no Visual disturbance: no Glucose Monitoring: no    Accucheck frequency: Not Checking    Fasting glucose:     Post prandial:  Diabetic Education: Not Completed Family history of diabetes: no    HYPOTHYROIDISM Takes Levothyroxine  150 MCG daily. Thyroid  control status:stable Satisfied with current treatment? yes Medication side effects: no Medication compliance: good compliance Etiology of hypothyroidism: unknown Recent dose adjustment:no Fatigue: no Cold intolerance: no Heat intolerance: no Weight gain: no Weight loss: no Constipation: no Diarrhea/loose stools: no Palpitations: no Lower extremity edema: no Anxiety/depressed mood: no   CHRONIC KIDNEY DISEASE (Stage 3a) Saw nephrology on 10/02/24. CKD status: stable Medications renally dose: yes Previous renal evaluation: yes Pneumovax:  Up to Date Influenza Vaccine:  Refused  ANXIETY/STRESS Taking Wellbutrin , Buspar , Celexa , and Belsomra  as needed (takes 1/4 tablet if needed). Has some stressors with grand daughter at present. Duration:stable Anxious mood: yes - a little Excessive worrying: about grand daughter Irritability: no  Sweating: no Nausea: no Palpitations:no Hyperventilation: no Panic attacks: no Agoraphobia: no  Obscessions/compulsions: no Depressed mood: occasional    11/19/2024    1:05 PM 08/06/2024    3:54 PM 05/08/2024    2:39 PM 02/01/2024   11:30 AM 11/14/2023   11:19 AM  Depression screen PHQ 2/9  Decreased Interest 1 0 0 1 1  Down, Depressed, Hopeless 0 1 1 1 1   PHQ - 2 Score 1 1 1 2 2   Altered sleeping 1 0 3 3 1   Tired, decreased energy 0 0 2 1 1   Change in appetite 0 0 1 1 0  Feeling bad or failure about yourself  0 0 0 0 0  Trouble concentrating 1 0 2 1 1   Moving slowly or fidgety/restless  1 0 0 0 0  Suicidal thoughts 0 0 0 0 0  PHQ-9 Score 4 1  9  8  5    Difficult doing work/chores  Not difficult at all Somewhat difficult  Not difficult at all     Data saved with a previous flowsheet row definition  Anhedonia: no Weight changes: no Insomnia: yes hard to fall asleep occasionally Hypersomnia: no Fatigue/loss of energy: no Feelings of worthlessness: no Feelings of guilt:  no Impaired concentration/indecisiveness: no Suicidal ideations: no  Crying spells: no Recent Stressors/Life Changes: yes   Relationship problems: no   Family stress: yes  as above   Financial stress: no    Job stress: no    Recent death/loss: no     11/21/2024    1:05 PM 05/08/2024    2:40 PM 02/01/2024   11:31 AM 10/24/2023    3:08 PM  GAD 7 : Generalized Anxiety Score  Nervous, Anxious, on Edge 1 1 1 2   Control/stop worrying 1 1 1 1   Worry too much - different things 1 1 2 1   Trouble relaxing 0 1 1 1   Restless 0 0 1 1  Easily annoyed or irritable 1 0 1 1  Afraid - awful might happen 1 1 1 1   Total GAD 7 Score 5 5 8 8   Anxiety Difficulty  Somewhat difficult Somewhat difficult Somewhat difficult   Relevant past medical, surgical, family and social history reviewed and updated as indicated. Interim medical history since our last visit reviewed. Allergies and medications reviewed and updated.  Review of Systems  Constitutional:  Negative for activity change, appetite change, diaphoresis, fatigue and fever.  Respiratory:  Positive for shortness of breath (occasional if walking longer distance). Negative for cough, chest tightness and wheezing.   Cardiovascular:  Negative for chest pain, palpitations and leg swelling.  Gastrointestinal: Negative.   Endocrine: Negative.   Neurological: Negative.   Psychiatric/Behavioral:  Negative for decreased concentration, self-injury, sleep disturbance and suicidal ideas. The patient is nervous/anxious.     Per HPI unless specifically indicated above     Objective:    BP 131/81 (BP Location: Left Arm, Patient Position: Sitting, Cuff Size: Normal)   Pulse 66   Temp 97.7 F (36.5 C) (Oral)   Resp 18   Ht 5' 10.98 (1.803 m)   Wt 189 lb 12.8 oz (86.1 kg)   SpO2 97%   BMI 26.48 kg/m   Wt Readings from Last 3 Encounters:  11-21-2024 189 lb 12.8 oz (86.1 kg)  08/16/24 190 lb 9.6 oz (86.5 kg)  08/06/24 188 lb (85.3 kg)    Physical  Exam Vitals and nursing note reviewed.  Constitutional:      General: He is awake. He is not in acute distress.    Appearance: He is well-developed and well-groomed. He is not ill-appearing or toxic-appearing.  HENT:     Head: Normocephalic.     Right Ear: Hearing and external ear normal.     Left Ear: Hearing and external ear normal.  Eyes:     General: Lids are normal.     Extraocular Movements: Extraocular movements intact.     Conjunctiva/sclera: Conjunctivae normal.  Neck:     Thyroid : No thyromegaly.     Vascular: No carotid bruit.  Cardiovascular:     Rate and Rhythm: Normal rate and regular rhythm.     Heart sounds: Normal heart sounds. No murmur heard.    No gallop.  Pulmonary:     Effort: No  accessory muscle usage or respiratory distress.     Breath sounds: Normal breath sounds. No decreased breath sounds, wheezing or rales.  Abdominal:     General: Bowel sounds are normal. There is no distension.     Palpations: Abdomen is soft.     Tenderness: There is no abdominal tenderness.  Musculoskeletal:     Cervical back: Full passive range of motion without pain.     Right lower leg: No edema.     Left lower leg: No edema.  Lymphadenopathy:     Cervical: No cervical adenopathy.  Skin:    General: Skin is warm.     Capillary Refill: Capillary refill takes less than 2 seconds.  Neurological:     Mental Status: He is alert and oriented to person, place, and time.     Deep Tendon Reflexes: Reflexes are normal and symmetric.     Reflex Scores:      Brachioradialis reflexes are 2+ on the right side and 2+ on the left side.      Patellar reflexes are 2+ on the right side and 2+ on the left side. Psychiatric:        Attention and Perception: Attention normal.        Mood and Affect: Mood normal.        Speech: Speech normal.        Behavior: Behavior normal. Behavior is cooperative.        Thought Content: Thought content normal.    Results for orders placed or performed  in visit on 10/01/24  CUP PACEART REMOTE DEVICE CHECK   Collection Time: 09/30/24 10:25 PM  Result Value Ref Range   Date Time Interrogation Session [phone]    Pulse Generator Manufacturer MERM    Pulse Gen Model [id number] Cobalt XT DR MRI    Pulse Gen Serial Number [id number]    Clinic Name [facility]    Implantable Pulse Generator Type Implantable Cardiac Defibulator    Implantable Pulse Generator Implant Date 79759180    Implantable Lead Manufacturer MERM    Implantable Lead Model 5076 CapSureFix Novus MRI SureScan    Implantable Lead Serial Number [id number]    Implantable Lead Implant Date 79759180    Implantable Lead Location Detail 1 APPENDAGE    Implantable Lead Location [id number]    Implantable Lead Connection Status [id number]    Implantable Lead Manufacturer [facility]    Implantable Lead Model [id number] Sprint Quattro Secure S MRI SureScan    Implantable Lead Serial Number [id number]    Implantable Lead Implant Date 79759180    Implantable Lead Location Detail 1 SEPTUM    Implantable Lead Location [id number]    Implantable Lead Connection Status [id number]    Lead Channel Setting Sensing Sensitivity 0.3 mV   Lead Channel Setting Pacing Amplitude 1.5 V   Lead Channel Setting Pacing Pulse Width 0.4 ms   Lead Channel Setting Pacing Amplitude 2.0 V   Zone Setting Status Active    Zone Setting Status Inactive    Zone Setting Status Active    Zone Setting Status 755011    Zone Setting Status 755011    Zone Setting Status Active    Lead Channel Impedance Value 475 ohm   Lead Channel Sensing Intrinsic Amplitude 2.8 mV   Lead Channel Pacing Threshold Amplitude 0.75 V   Lead Channel Pacing Threshold Pulse Width 0.4 ms   Lead Channel Impedance Value 342 ohm   Lead Channel Impedance Value 399 ohm   Lead Channel Sensing  Intrinsic Amplitude 11.3 mV   Lead Channel Pacing Threshold Amplitude 0.5 V   Lead Channel Pacing Threshold Pulse Width 0.4 ms   HighPow Impedance 60 ohm   Battery  Remaining Longevity 138 mo   Battery Voltage 3.01 V   Brady Statistic RA Percent Paced 37.53 %   Brady Statistic RV Percent Paced 0.07 %   Brady Statistic AP VP Percent 0.02 %   Brady Statistic AS VP Percent 0.05 %   Brady Statistic AP VS Percent 38.03 %   Brady Statistic AS VS Percent 61.90 %      Assessment & Plan:   Problem List Items Addressed This Visit       Cardiovascular and Mediastinum   VT (ventricular tachycardia) (HCC)   Stable, has ICD placed on 07/03/23.  Continue collaboration with cardiology.  Recent notes reviewed.      Relevant Medications   valsartan  (DIOVAN ) 40 MG tablet   Paroxysmal atrial fibrillation (HCC) - Primary   Chronic, ongoing.  Rate controlled at this time.  Continue collaboration with cardiology, recent notes reviewed. Labs today.      Relevant Medications   valsartan  (DIOVAN ) 40 MG tablet   Heart failure with reduced ejection fraction (HCC)   Chronic, ongoing.  Euvolemic on exam today. Recent EF stable at 40-45% July 2024.  Continue collaboration with cardiology. Recommend: - Reminded to call for an overnight weight gain of >2 pounds or a weekly weight gain of >5 pounds - not adding salt to food and read food labels. Reviewed the importance of keeping daily sodium intake to 2000mg  daily. - Avoid NSAIDS      Relevant Medications   valsartan  (DIOVAN ) 40 MG tablet   Benign hypertension with CKD (chronic kidney disease) stage III (HCC)   Chronic, stable.  BP right at goal today in office and at home is not consistently <130/80.  Entresto  was stopped by nephrology in November 2025 due to low BP and his insurance no longer covers this medication. Will start a low dose of Valsartan  40 MG daily.  Alerted cardiology via secure message since he sees them in 9 days. Educated his wife and him on this. Continue remainder of medications. Recommend he monitor BP at least a few mornings a week at home and document.  DASH diet at home.  Labs today: up to date.  Urine ALB 13 May 2024.       Relevant Medications   valsartan  (DIOVAN ) 40 MG tablet     Endocrine   Secondary hyperparathyroidism of renal origin (Chronic)   Chronic, stable. Labs up to date with nephrology. Recent notes reviewed.  Will continue this collaboration.      IFG (impaired fasting glucose)   Recheck A1c today, continue diet focus.      Relevant Orders   Bayer DCA Hb A1c Waived   Hypothyroid   Chronic, ongoing.  Continue current medication regimen and adjust as needed.  Labs up to date.        Genitourinary   CKD (chronic kidney disease) stage 3, GFR 30-59 ml/min (HCC)   Chronic, ongoing CKD 3a. Followed by nephrology.  Continue current medication regimen and adjust as needed.  Labs: up to date.  Urine ALB 13 May 2024.      Relevant Orders   Magnesium      Other   Insomnia   Ongoing. Improved sleep at this time since returning to use of Celexa .  Continue Belsomra  only as needed.  They have lower dose pills left  at home still.      ICD (implantable cardioverter-defibrillator) in place   Placed 07/03/23, tolerating well.  Continue collaboration with cardiology.      Hyperlipidemia   Chronic, stable.  Continue current medication regimen and adjust as needed.  Lipid panel today.      Relevant Medications   valsartan  (DIOVAN ) 40 MG tablet   Other Relevant Orders   Lipid Panel w/o Chol/HDL Ratio   Depression   Chronic, improved with Celexa  back on board. 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  Continue Wellbutrin  as ordered (max dose which he has been on for years), Celexa , and Buspar  20 MG TID (max dose, with goal to reduce in future).  Denies SI/HI.  Monitor Wellbutrin  and Toprol  XL use -- discussed may need to consider mood medication adjustments in future.  Would benefit therapy in future.   - Goals in long run is to reduce off Buspar  and Belsomra  to minimize medications.      Relevant Medications    busPIRone  (BUSPAR ) 10 MG tablet     Follow up plan: Return in about 6 months (around 05/19/2025) for Annual Physical.      "

## 2024-11-19 NOTE — Assessment & Plan Note (Addendum)
 Chronic, ongoing.  Euvolemic on exam today. Recent EF stable at 40-45% July 2024.  Continue collaboration with cardiology. Recommend: - Reminded to call for an overnight weight gain of >2 pounds or a weekly weight gain of >5 pounds - not adding salt to food and read food labels. Reviewed the importance of keeping daily sodium intake to 2000mg  daily. - Avoid NSAIDS

## 2024-11-19 NOTE — Telephone Encounter (Signed)
 Requested Prescriptions  Refused Prescriptions Disp Refills   busPIRone  (BUSPAR ) 10 MG tablet [Pharmacy Med Name: BUSPIRONE  HCL 10 MG TABLET] 540 tablet 0    Sig: Take 2 tablets (20 mg total) by mouth 3 (three) times daily.     Psychiatry: Anxiolytics/Hypnotics - Non-controlled Passed - 11/19/2024  1:39 PM      Passed - Valid encounter within last 12 months    Recent Outpatient Visits           Today Paroxysmal atrial fibrillation (HCC)   [place] [facility] [place], Magnolia T, NP   3 months ago Anxiety   [place] [facility] [place], Warner T, NP   4 months ago Major depressive disorder with single episode, in partial remission   [place] [facility] [place], [place] T, NP   6 months ago Heart failure with reduced ejection fraction (HCC)   [place] [facility] [place], Melanie T, NP   8 months ago Major depressive disorder with single episode, in partial remission   [place] [facility] [place], Melanie DASEN, NP       Future Appointments             In 1 week Darron Deatrice LABOR, MD [facility] [place] [facility]

## 2024-11-19 NOTE — Assessment & Plan Note (Signed)
 Chronic, ongoing CKD 3a. Followed by nephrology.  Continue current medication regimen and adjust as needed.  Labs: up to date.  Urine ALB 13 May 2024.

## 2024-11-19 NOTE — Assessment & Plan Note (Signed)
Recheck A1c today, continue diet focus. 

## 2024-11-19 NOTE — Assessment & Plan Note (Signed)
 Ongoing. Improved sleep at this time since returning to use of Celexa .  Continue Belsomra  only as needed.  They have lower dose pills left at home still.

## 2024-11-19 NOTE — Assessment & Plan Note (Signed)
 Chronic, stable.  Continue current medication regimen and adjust as needed.  Lipid panel today.

## 2024-11-19 NOTE — Assessment & Plan Note (Signed)
 Chronic, ongoing.  Rate controlled at this time.  Continue collaboration with cardiology, recent notes reviewed. Labs today.

## 2024-11-19 NOTE — Assessment & Plan Note (Signed)
 Chronic, ongoing.  Continue current medication regimen and adjust as needed.  Labs up to date.

## 2024-11-19 NOTE — Assessment & Plan Note (Signed)
Placed 07/03/23, tolerating well.  Continue collaboration with cardiology.

## 2024-11-20 ENCOUNTER — Ambulatory Visit: Payer: Self-pay | Admitting: Nurse Practitioner

## 2024-11-20 LAB — LIPID PANEL W/O CHOL/HDL RATIO
Cholesterol, Total: 131 mg/dL (ref 100–199)
HDL: 50 mg/dL
LDL Chol Calc (NIH): 62 mg/dL (ref 0–99)
Triglycerides: 105 mg/dL (ref 0–149)
VLDL Cholesterol Cal: 19 mg/dL (ref 5–40)

## 2024-11-20 LAB — MAGNESIUM: Magnesium: 1.9 mg/dL (ref 1.6–2.3)

## 2024-11-20 NOTE — Progress Notes (Signed)
 Good afternoon [name], your labs have returned and look fabulous. No changes needed. Great job!! Keep being stellar!!  Thank you for allowing me to participate in your care.  I appreciate you. Kindest regards, Tuwanda Vokes

## 2024-11-28 ENCOUNTER — Ambulatory Visit: Attending: Cardiovascular Disease | Admitting: Cardiovascular Disease

## 2024-11-28 ENCOUNTER — Encounter: Payer: Self-pay | Admitting: Cardiovascular Disease

## 2024-11-28 VITALS — BP 100/60 | HR 67 | Ht 71.0 in | Wt 190.5 lb

## 2024-11-28 DIAGNOSIS — I480 Paroxysmal atrial fibrillation: Secondary | ICD-10-CM

## 2024-11-28 DIAGNOSIS — E785 Hyperlipidemia, unspecified: Secondary | ICD-10-CM | POA: Diagnosis not present

## 2024-11-28 DIAGNOSIS — I10 Essential (primary) hypertension: Secondary | ICD-10-CM | POA: Diagnosis not present

## 2024-11-28 DIAGNOSIS — I5022 Chronic systolic (congestive) heart failure: Secondary | ICD-10-CM | POA: Diagnosis not present

## 2024-11-28 DIAGNOSIS — I25118 Atherosclerotic heart disease of native coronary artery with other forms of angina pectoris: Secondary | ICD-10-CM

## 2024-11-28 NOTE — Patient Instructions (Signed)

## 2024-11-28 NOTE — Progress Notes (Signed)
 "    Cardiology Office Note   Date:  11/28/2024   ID:  Stuart Harvey, DOB [birth year], MRN [id number]  PCP:  Valerio Melanie DASEN, NP  Cardiologist:   Deatrice Cage, MD   Chief Complaint  Patient presents with   Follow-up    6 month f/u no complaints today. Meds reviewed verbally with pt.      History of Present Illness: Stuart Harvey is a 74 y.o. male who presents fora followup visit regarding coronary artery disease, chronic systolic heart failure and paroxysmal atrial fibrillation..  He has known history of severe three-vessel coronary artery disease diagnosed in 2008 as well as a large infrarenal abdominal aortic aneurysm. He underwent coronary artery bypass graft surgery in August 2008 at [facility]. He underwent open surgical repair of aortic aneurysm in October of 2008.     He was hospitalized in July of 2019 with unresponsiveness.  He reportedly was down for about 1 hour with possible seizure-like activities.  He had numerous contusions.  Per EMS he was in ventricular tachycardia upon arrival and was cardioverted.  He was briefly intubated and was found to have mildly elevated troponin.  CPK was close to 4000.  Echo showed an EF of 50 to 55%.  He underwent cardiac catheterization which showed severe underlying three-vessel coronary artery disease with patent grafts including LIMA to LAD, SVG to RCA, SVG to diagonal and SVG to OM 3.  There was borderline disease in the SVG to OM but that was left to be treated medically given that it was supplying a small area.  The patient did develop atrial fibrillation while hospitalized.  He converted spontaneously.  His presentation was felt to be possibly triggered by heat stroke.  He underwent endovascular repair of iliac artery aneurysm in September of 2022.  He was hospitalized in July 2024 with out-of-hospital cardiac arrest echocardiogram showed an EF of 40 to 45% with inferolateral hypokinesis.  Cardiac MRI showed an EF of 38% with lateral wall  scar.  EP study was completed and showed inducible ventricular tachycardia.  An ICD was subsequently placed.  He had an ICD shock in May 2025 due to atrial fibrillation.  The patient did not want to take amiodarone  due to extensive GI side effects.  He was seen by nephrology recently and was noted to be hypotensive.  Entresto  was stopped.  He subsequently followed with his primary care provider.  Valsartan  was added instead.  He has been doing well with no chest pain, shortness of breath or palpitations.  Past Medical History:  Diagnosis Date   AAA (abdominal aortic aneurysm)    a.) s/p EVAR in 08/2007 by Dr. Primus. b.) CTA on 06/30/2021 --> interval aneurysmal dilitation superior to stent graft with the juxtarenal aorta measuring 3.8 cm.   Allergy    Anemia    Arthritis    both knees   Chronic anticoagulation    Apixaban    Chronic HFrEF (heart failure with reduced ejection fraction) (HCC)    a. 07/2022 Echo: EF 35-40%, glob HK, mild LVH, GrI DD, nl RV fxn, mildly dil RA/LA, mild MR, mild AoV sclerosis. Asc Ao 40mm.   CKD (chronic kidney disease), stage II    Coronary artery disease    a. 06/2007 CABGx4 @ [facility] (LIMA-LAD, VG-RPDA, VG-OM3,VG-D2); b. 2011 Cath: patent grafts EF 40%, c.06/05/18 Cath: Sev native dzs, LIMA-LAD patent, VG-RPDA patent, VG-OM3 mid-graft 60% and 80%; d. 07/2022 Cath: LM nl, LAD 50p, 90p/m, D1 70, RI  30,  LCX 40ost/p, 100p CTO, OM3 100, RCA 100p CTO, LIMA->LAD ok, VG->D2 ok, VG->RPDA min irregs, VG->OM3 100->Med rx.   Depression    GERD (gastroesophageal reflux disease)    Hernia of abdominal cavity    History of 2019 novel coronavirus disease (COVID-19) 09/2019   Hyperlipidemia    Hypertension    Hypothyroidism    Iliac artery aneurysm, right    a.) at the distal landing zone of the right iliac limb measuring 3 cm   Ischemic cardiomyopathy    Right middle lobe pulmonary nodule 06/30/2021   a.) measured 8 mm by CT on 06/30/2021.   S/P CABG x 4 06/25/2007   a.)  LIMA-LAD, SVG-RPDA, SVG-OM3, SVG-D2   Sigmoid diverticulosis    Ventricular tachycardia (HCC) 05/2018   a.) found unresponsive by EMS; VT noted --> defibrillation achieved ROSC. Briefly intubated. Episode felt to be secondary to heat stoke.    Past Surgical History:  Procedure Laterality Date   ABDOMINAL AORTIC ANEURYSM REPAIR N/A 08/29/2007   Procedure: EVAR; Location: [facility]; Surgeon: Prentice Roger, MD   ABDOMINAL AORTIC ANEURYSM REPAIR N/A 12/05/2012   Procedure: Abdominal aortic aneurysm of approximately 6 cm in maximal diameter status post previous endovascular repair with type I and III endoleaks; Location: [facility]; Surgeon: Selinda Gu, MD   CARDIAC CATHETERIZATION Left 06/28/2010   3v CAD with patent CABG grafts; LVEF 40%; stable aortic stent graft; Location: [facility]; Surgeon: Wolm Rhyme, MD   CARDIAC CATHETERIZATION Left 06/18/2007   3v CAD; LVEF 50%; refer to CVTS for CABG; Location: [facility]; Surgeon: Deatrice Cage, MD   CARDIOVERSION N/A 05/22/2023   Procedure: CARDIOVERSION;  Surgeon: Cage Deatrice LABOR, MD;  Location: [facility] ORS;  Service: Cardiovascular;  Laterality: N/A;   COLONOSCOPY WITH PROPOFOL  N/A 11/17/2021   Procedure: COLONOSCOPY WITH PROPOFOL ;  Surgeon: Unk Corinn Skiff, MD;  Location: [facility] ENDOSCOPY;  Service: Gastroenterology;  Laterality: N/A;   CORONARY ARTERY BYPASS GRAFT N/A 06/25/2007   Procedure: 4v CABG (LIMA-LAD, SVG-RPDA, SVG-OM3, SVG-D2); Location: [facility]; Surgeon: Maude Sharps, MD   CORONARY/GRAFT ANGIOGRAPHY N/A 06/05/2018   Procedure: EL ANGIOGRAPHY;  Surgeon: Mady Bruckner, MD;  Location: [facility] INVASIVE CV LAB;  Service: Cardiovascular;  Laterality: N/A;   ELECTROPHYSIOLOGY STUDY N/A 05/26/2023   Procedure: ELECTROPHYSIOLOGY STUDY;  Surgeon: Cindie Ole DASEN, MD;  Location: [facility] INVASIVE CV LAB;  Service: Cardiovascular;  Laterality: N/A;   ENDOVASCULAR STENT GRAFT (AAA) N/A 08/04/2021   Procedure: ENDOVASCULAR REPAIR/STENT GRAFT;  Surgeon: Gu Selinda RAMAN,  MD;  Location: [facility] INVASIVE CV LAB;  Service: Cardiovascular;  Laterality: N/A;   EXPLORATORY LAPAROTOMY  1997   HERNIA REPAIR     ICD IMPLANT N/A 07/03/2023   Procedure: ICD IMPLANT;  Surgeon: Cindie Ole DASEN, MD;  Location: [facility] INVASIVE CV LAB;  Service: Cardiovascular;  Laterality: N/A;   KNEE ARTHROPLASTY Left 09/19/2016   Procedure: COMPUTER ASSISTED TOTAL KNEE ARTHROPLASTY;  Surgeon: Lynwood SHAUNNA Hue, MD;  Location: [facility] ORS;  Service: Orthopedics;  Laterality: Left;   RIGHT/LEFT HEART CATH AND CORONARY ANGIOGRAPHY N/A 08/05/2022   Procedure: RIGHT/LEFT HEART CATH AND CORONARY ANGIOGRAPHY;  Surgeon: Cage Deatrice LABOR, MD;  Location: [facility] INVASIVE CV LAB;  Service: Cardiovascular;  Laterality: N/A;   TOTAL KNEE ARTHROPLASTY Right 09/2016     Current Outpatient Medications  Medication Sig Dispense Refill   acetaminophen  (TYLENOL ) 500 MG tablet Take 1,000 mg by mouth every 6 (six) hours as needed for moderate pain.     apixaban  (ELIQUIS ) 5 MG TABS tablet Take 1 tablet (5 mg total)  by mouth 2 (two) times daily. 180 tablet 3   buPROPion  (WELLBUTRIN  SR) 200 MG 12 hr tablet Take 1 tablet (200 mg total) by mouth 2 (two) times daily. 180 tablet 2   busPIRone  (BUSPAR ) 10 MG tablet Take 2 tablets (20 mg total) by mouth 3 (three) times daily. 540 tablet 3   Cholecalciferol  50 MCG (2000 UT) CAPS Take 1 capsule by mouth daily.      citalopram  (CELEXA ) 20 MG tablet Take 1 tablet (20 mg total) by mouth daily. 90 tablet 3   ezetimibe  (ZETIA ) 10 MG tablet Take 1 tablet (10 mg total) by mouth daily. 90 tablet 3   levothyroxine  (SYNTHROID ) 150 MCG tablet Take 1 tablet (150 mcg total) by mouth daily. 90 tablet 3   metoprolol  succinate (TOPROL -XL) 25 MG 24 hr tablet Take 1 tablet (25 mg total) by mouth 2 (two) times daily. 180 tablet 3   nitroGLYCERIN  (NITROSTAT ) 0.4 MG SL tablet Place 1 tablet (0.4 mg total) under the tongue every 5 (five) minutes x 3 doses as needed for chest pain. 25 tablet 2   omeprazole   (PRILOSEC) 20 MG capsule Take 1 capsule (20 mg total) by mouth daily. 90 capsule 4   rosuvastatin  (CRESTOR ) 40 MG tablet Take 1 tablet (40 mg total) by mouth daily. 90 tablet 3   Suvorexant  (BELSOMRA ) 20 MG TABS Take 1 tablet (20 mg total) by mouth at bedtime as needed. 30 tablet 4   valsartan  (DIOVAN ) 40 MG tablet Take 1 tablet (40 mg total) by mouth daily. 90 tablet 3   No current facility-administered medications for this visit.    Allergies:   Benzodiazepines, Codeine, and Tetracycline    Social History:  The patient  reports that he quit smoking about 18 years ago. His smoking use included cigarettes. He started smoking about 53 years ago. He has a 35 pack-year smoking history. He has been exposed to tobacco smoke. He has quit using smokeless tobacco.  His smokeless tobacco use included chew. He reports that he does not drink alcohol and does not use drugs.   Family History:  The patient's family history includes Depression in his maternal grandfather and maternal grandmother; Heart attack in his father, mother, and another family member; Heart disease in his mother and another family member; Hypertension in his father and mother; Kidney disease in his brother.    ROS:  Please see the history of present illness.   Otherwise, review of systems are positive for none.   All other systems are reviewed and negative.    PHYSICAL EXAM: VS:  BP 100/60 (BP Location: Left Arm, Patient Position: Sitting, Cuff Size: Large)   Pulse 67   Ht 5' 11 (1.803 m)   Wt 190 lb 8 oz (86.4 kg)   SpO2 98%   BMI 26.57 kg/m  , BMI Body mass index is 26.57 kg/m. GEN: Well nourished, well developed, in no acute distress  HEENT: normal  Neck: no JVD, carotid bruits, or masses Cardiac: RRR; no murmurs, rubs, or gallops,no edema  Respiratory:  clear to auscultation bilaterally, normal work of breathing GI: soft, nontender, nondistended, + BS MS: no deformity or atrophy  Skin: warm and dry, no rash Neuro:   Strength and sensation are intact Psych: euthymic mood, full affect   EKG:  EKG is ordered today. The ekg ordered today demonstrates : Normal sinus rhythm Left anterior fascicular block Minimal voltage criteria for LVH, may be normal variant ( Cornell product ) ST & T wave  abnormality, consider lateral ischemia When compared with ECG of 29-May-2024 13:25, Sinus rhythm has replaced Electronic atrial pacemaker     Recent Labs: 04/04/2024: BNP 321.3 05/08/2024: ALT 12; BUN 29; Creatinine, Ser 1.71; Hemoglobin 13.5; Platelets 182; Potassium 4.9; Sodium 142; TSH 0.666 11/19/2024: Magnesium  1.9    Lipid Panel    Component Value Date/Time   CHOL 131 11/19/2024 1331   CHOL 159 12/18/2015 0827   TRIG 105 11/19/2024 1331   TRIG 166 (H) 12/18/2015 0827   HDL 50 11/19/2024 1331   CHOLHDL 2.9 03/29/2022 0913   CHOLHDL 3.4 09/25/2018 1208   VLDL 31 09/25/2018 1208   VLDL 33 (H) 12/18/2015 0827   LDLCALC 62 11/19/2024 1331      Wt Readings from Last 3 Encounters:  11/28/24 190 lb 8 oz (86.4 kg)  11/19/24 189 lb 12.8 oz (86.1 kg)  08/16/24 190 lb 9.6 oz (86.5 kg)       ASSESSMENT AND PLAN:  1.  Coronary artery disease involving native coronary arteries without angina: He is stable overall.  Continue medical therapy.  2.  Paroxysmal atrial fibrillation: Currently in sinus rhythm.  Continue metoprolol  and anticoagulation with Eliquis .  He does not tolerate amiodarone  due to GI side symptoms.  3.  Essential hypertension: Blood pressure is controlled on small dose metoprolol .  4. Hyperlipidemia: I reviewed recent lipid profile done on January 6 which showed an LDL of 62.  Continue ezetimibe  and rosuvastatin .  5. Abdominal aortic aneurysm/iliac artery aneurysm: Status post repair. This is followed by Dr. Marea.  6.  Chronic systolic heart failure: Most recent EF was 38%.  He did not tolerate Entresto  due to hypotension.  I agree with valsartan  as an alternative.  Continue Toprol .  He  does have underlying chronic kidney disease and thus he is not on spironolactone.  I suspect that his blood pressure will not allow that.  However, we should consider adding an SGLT2 inhibitor in the future.    Disposition: Follow-up in 6 months.  Signed,  Deatrice Cage, MD  11/28/2024 9:04 AM    [facility] "

## 2025-05-23 ENCOUNTER — Encounter: Admitting: Nurse Practitioner

## 2025-07-18 ENCOUNTER — Other Ambulatory Visit (INDEPENDENT_AMBULATORY_CARE_PROVIDER_SITE_OTHER)

## 2025-07-18 ENCOUNTER — Ambulatory Visit (INDEPENDENT_AMBULATORY_CARE_PROVIDER_SITE_OTHER): Admitting: Nurse Practitioner
